# Patient Record
Sex: Male | Born: 1985
Health system: Southern US, Community
[De-identification: ages and names within clinical notes are randomized; demographics above are authoritative.]

## PROBLEM LIST (undated history)

## (undated) DIAGNOSIS — I509 Heart failure, unspecified: Secondary | ICD-10-CM

## (undated) DIAGNOSIS — R519 Headache, unspecified: Secondary | ICD-10-CM

## (undated) DIAGNOSIS — F32A Depression, unspecified: Secondary | ICD-10-CM

## (undated) DIAGNOSIS — K219 Gastro-esophageal reflux disease without esophagitis: Secondary | ICD-10-CM

## (undated) DIAGNOSIS — Z720 Tobacco use: Secondary | ICD-10-CM

## (undated) DIAGNOSIS — Z91199 Patient's noncompliance with other medical treatment and regimen due to unspecified reason: Secondary | ICD-10-CM

## (undated) DIAGNOSIS — F1210 Cannabis abuse, uncomplicated: Secondary | ICD-10-CM

## (undated) DIAGNOSIS — F319 Bipolar disorder, unspecified: Secondary | ICD-10-CM

## (undated) DIAGNOSIS — H9192 Unspecified hearing loss, left ear: Secondary | ICD-10-CM

## (undated) DIAGNOSIS — Q8781 Alport syndrome: Secondary | ICD-10-CM

## (undated) DIAGNOSIS — F329 Major depressive disorder, single episode, unspecified: Secondary | ICD-10-CM

## (undated) DIAGNOSIS — N289 Disorder of kidney and ureter, unspecified: Secondary | ICD-10-CM

## (undated) DIAGNOSIS — R011 Cardiac murmur, unspecified: Secondary | ICD-10-CM

## (undated) DIAGNOSIS — N186 End stage renal disease: Secondary | ICD-10-CM

## (undated) DIAGNOSIS — E162 Hypoglycemia, unspecified: Secondary | ICD-10-CM

## (undated) DIAGNOSIS — I10 Essential (primary) hypertension: Secondary | ICD-10-CM

## (undated) DIAGNOSIS — D649 Anemia, unspecified: Secondary | ICD-10-CM

## (undated) DIAGNOSIS — N189 Chronic kidney disease, unspecified: Secondary | ICD-10-CM

## (undated) DIAGNOSIS — Z992 Dependence on renal dialysis: Secondary | ICD-10-CM

## (undated) DIAGNOSIS — H9191 Unspecified hearing loss, right ear: Secondary | ICD-10-CM

## (undated) HISTORY — PX: APPENDECTOMY: SHX54

## (undated) HISTORY — PX: SPINE SURGERY: SHX786

## (undated) HISTORY — PX: OTHER SURGICAL HISTORY: SHX169

## (undated) HISTORY — DX: Chronic kidney disease, unspecified: N18.9

## (undated) HISTORY — DX: Hypoglycemia, unspecified: E16.2

## (undated) HISTORY — PX: WISDOM TOOTH EXTRACTION: SHX21

---
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
## 2009-06-06 ENCOUNTER — Emergency Department (HOSPITAL_COMMUNITY): Admission: EM | Admit: 2009-06-06 | Discharge: 2009-06-06 | Payer: Self-pay | Admitting: Family Medicine

## 2009-12-01 ENCOUNTER — Emergency Department (HOSPITAL_COMMUNITY): Admission: EM | Admit: 2009-12-01 | Discharge: 2009-12-01 | Payer: Self-pay | Admitting: Emergency Medicine

## 2010-03-09 ENCOUNTER — Other Ambulatory Visit: Payer: Self-pay | Admitting: Emergency Medicine

## 2010-03-09 ENCOUNTER — Inpatient Hospital Stay (HOSPITAL_COMMUNITY)
Admission: RE | Admit: 2010-03-09 | Discharge: 2010-03-12 | Payer: Self-pay | Source: Home / Self Care | Admitting: Psychiatry

## 2010-03-09 ENCOUNTER — Ambulatory Visit: Payer: Self-pay | Admitting: Psychiatry

## 2010-04-14 ENCOUNTER — Emergency Department (HOSPITAL_COMMUNITY)
Admission: EM | Admit: 2010-04-14 | Discharge: 2010-04-14 | Payer: Self-pay | Source: Home / Self Care | Admitting: Emergency Medicine

## 2010-04-15 ENCOUNTER — Emergency Department (HOSPITAL_COMMUNITY)
Admission: EM | Admit: 2010-04-15 | Discharge: 2010-04-15 | Payer: Self-pay | Source: Home / Self Care | Admitting: Emergency Medicine

## 2010-04-17 ENCOUNTER — Observation Stay (HOSPITAL_COMMUNITY)
Admission: EM | Admit: 2010-04-17 | Discharge: 2010-04-19 | Payer: Self-pay | Source: Home / Self Care | Attending: Internal Medicine | Admitting: Internal Medicine

## 2010-04-20 ENCOUNTER — Emergency Department (HOSPITAL_COMMUNITY)
Admission: EM | Admit: 2010-04-20 | Discharge: 2010-04-21 | Payer: Self-pay | Source: Home / Self Care | Admitting: Emergency Medicine

## 2010-04-23 ENCOUNTER — Emergency Department (HOSPITAL_COMMUNITY)
Admission: EM | Admit: 2010-04-23 | Discharge: 2010-04-24 | Payer: Self-pay | Source: Home / Self Care | Admitting: Emergency Medicine

## 2010-05-23 ENCOUNTER — Emergency Department (HOSPITAL_COMMUNITY)
Admission: EM | Admit: 2010-05-23 | Discharge: 2010-05-24 | Payer: Self-pay | Source: Home / Self Care | Admitting: Emergency Medicine

## 2010-05-27 LAB — DIFFERENTIAL
Basophils Absolute: 0 10*3/uL (ref 0.0–0.1)
Basophils Relative: 1 % (ref 0–1)
Eosinophils Absolute: 0.1 10*3/uL (ref 0.0–0.7)
Eosinophils Relative: 3 % (ref 0–5)
Lymphocytes Relative: 63 % — ABNORMAL HIGH (ref 12–46)
Lymphs Abs: 3.1 10*3/uL (ref 0.7–4.0)
Monocytes Absolute: 0.3 10*3/uL (ref 0.1–1.0)
Monocytes Relative: 7 % (ref 3–12)
Neutro Abs: 1.3 10*3/uL — ABNORMAL LOW (ref 1.7–7.7)
Neutrophils Relative %: 27 % — ABNORMAL LOW (ref 43–77)

## 2010-05-27 LAB — CBC
HCT: 38.8 % — ABNORMAL LOW (ref 39.0–52.0)
Hemoglobin: 13.3 g/dL (ref 13.0–17.0)
MCH: 31.1 pg (ref 26.0–34.0)
MCHC: 34.3 g/dL (ref 30.0–36.0)
MCV: 90.7 fL (ref 78.0–100.0)
Platelets: 161 K/uL (ref 150–400)
RBC: 4.28 MIL/uL (ref 4.22–5.81)
RDW: 13 % (ref 11.5–15.5)
WBC: 5 K/uL (ref 4.0–10.5)

## 2010-05-27 LAB — POCT I-STAT, CHEM 8
BUN: 33 mg/dL — ABNORMAL HIGH (ref 6–23)
Calcium, Ion: 1.09 mmol/L — ABNORMAL LOW (ref 1.12–1.32)
Chloride: 109 mEq/L (ref 96–112)
Creatinine, Ser: 2.7 mg/dL — ABNORMAL HIGH (ref 0.4–1.5)
Glucose, Bld: 88 mg/dL (ref 70–99)
HCT: 39 % (ref 39.0–52.0)
Hemoglobin: 13.3 g/dL (ref 13.0–17.0)
Potassium: 4.6 mEq/L (ref 3.5–5.1)
Sodium: 138 mEq/L (ref 135–145)
TCO2: 23 mmol/L (ref 0–100)

## 2010-05-27 LAB — URINALYSIS, ROUTINE W REFLEX MICROSCOPIC
Bilirubin Urine: NEGATIVE
Hgb urine dipstick: NEGATIVE
Ketones, ur: NEGATIVE mg/dL
Nitrite: NEGATIVE
Protein, ur: NEGATIVE mg/dL
Specific Gravity, Urine: 1.007 (ref 1.005–1.030)
Urine Glucose, Fasting: NEGATIVE mg/dL
Urobilinogen, UA: 0.2 mg/dL (ref 0.0–1.0)
pH: 6 (ref 5.0–8.0)

## 2010-05-27 LAB — POCT CARDIAC MARKERS
CKMB, poc: <1 ng/mL — ABNORMAL LOW (ref 1.0–8.0)
Myoglobin, poc: 65.6 ng/mL (ref 12–200)
Troponin i, poc: <0.05 ng/mL (ref 0.00–0.09)

## 2010-06-29 ENCOUNTER — Emergency Department (HOSPITAL_COMMUNITY): Payer: Medicare Other

## 2010-06-29 ENCOUNTER — Emergency Department (HOSPITAL_COMMUNITY)
Admission: EM | Admit: 2010-06-29 | Discharge: 2010-06-29 | Disposition: A | Discharge status: Home / Self Care | Payer: Medicare Other | Attending: Emergency Medicine | Admitting: Emergency Medicine

## 2010-06-29 DIAGNOSIS — R5381 Other malaise: Secondary | ICD-10-CM | POA: Insufficient documentation

## 2010-06-29 DIAGNOSIS — M545 Low back pain, unspecified: Secondary | ICD-10-CM | POA: Insufficient documentation

## 2010-06-29 DIAGNOSIS — R5383 Other fatigue: Secondary | ICD-10-CM | POA: Insufficient documentation

## 2010-06-29 DIAGNOSIS — R197 Diarrhea, unspecified: Secondary | ICD-10-CM | POA: Insufficient documentation

## 2010-06-29 DIAGNOSIS — Q898 Other specified congenital malformations: Secondary | ICD-10-CM | POA: Insufficient documentation

## 2010-06-29 DIAGNOSIS — R112 Nausea with vomiting, unspecified: Secondary | ICD-10-CM | POA: Insufficient documentation

## 2010-06-29 DIAGNOSIS — I129 Hypertensive chronic kidney disease with stage 1 through stage 4 chronic kidney disease, or unspecified chronic kidney disease: Secondary | ICD-10-CM | POA: Insufficient documentation

## 2010-06-29 DIAGNOSIS — F319 Bipolar disorder, unspecified: Secondary | ICD-10-CM | POA: Insufficient documentation

## 2010-06-29 DIAGNOSIS — N184 Chronic kidney disease, stage 4 (severe): Secondary | ICD-10-CM | POA: Insufficient documentation

## 2010-06-29 LAB — URINALYSIS, ROUTINE W REFLEX MICROSCOPIC
Bilirubin Urine: NEGATIVE
Hgb urine dipstick: NEGATIVE
Ketones, ur: NEGATIVE mg/dL
Leukocytes, UA: NEGATIVE
Nitrite: NEGATIVE
Protein, ur: 100 mg/dL — AB
Specific Gravity, Urine: 1.014 (ref 1.005–1.030)
Urine Glucose, Fasting: NEGATIVE mg/dL
Urobilinogen, UA: 0.2 mg/dL (ref 0.0–1.0)
pH: 5.5 (ref 5.0–8.0)

## 2010-06-29 LAB — DIFFERENTIAL
Basophils Absolute: 0 10*3/uL (ref 0.0–0.1)
Basophils Relative: 0 % (ref 0–1)
Eosinophils Absolute: 0 10*3/uL (ref 0.0–0.7)
Eosinophils Relative: 0 % (ref 0–5)
Lymphocytes Relative: 8 % — ABNORMAL LOW (ref 12–46)
Lymphs Abs: 0.5 10*3/uL — ABNORMAL LOW (ref 0.7–4.0)
Monocytes Absolute: 0.4 10*3/uL (ref 0.1–1.0)
Monocytes Relative: 5 % (ref 3–12)
Neutro Abs: 5.9 10*3/uL (ref 1.7–7.7)
Neutrophils Relative %: 87 % — ABNORMAL HIGH (ref 43–77)

## 2010-06-29 LAB — CBC
HCT: 46.9 % (ref 39.0–52.0)
Hemoglobin: 16.6 g/dL (ref 13.0–17.0)
MCH: 31.7 pg (ref 26.0–34.0)
MCHC: 35.4 g/dL (ref 30.0–36.0)
MCV: 89.7 fL (ref 78.0–100.0)
Platelets: 129 10*3/uL — ABNORMAL LOW (ref 150–400)
RBC: 5.23 MIL/uL (ref 4.22–5.81)
RDW: 12.6 % (ref 11.5–15.5)
WBC: 6.9 10*3/uL (ref 4.0–10.5)

## 2010-06-29 LAB — BASIC METABOLIC PANEL
BUN: 29 mg/dL — ABNORMAL HIGH (ref 6–23)
CO2: 23 mEq/L (ref 19–32)
Calcium: 9.5 mg/dL (ref 8.4–10.5)
Chloride: 109 mEq/L (ref 96–112)
Creatinine, Ser: 2.12 mg/dL — ABNORMAL HIGH (ref 0.4–1.5)
GFR calc Af Amer: 48 mL/min — ABNORMAL LOW (ref >60)
GFR calc non Af Amer: 39 mL/min — ABNORMAL LOW (ref >60)
Glucose, Bld: 102 mg/dL — ABNORMAL HIGH (ref 70–99)
Potassium: 4.9 mEq/L (ref 3.5–5.1)
Sodium: 138 mEq/L (ref 135–145)

## 2010-06-29 LAB — URINE MICROSCOPIC-ADD ON

## 2010-06-30 LAB — URINE CULTURE
Colony Count: NO GROWTH
Culture  Setup Time: 201202181733
Culture: NO GROWTH

## 2010-07-23 LAB — BASIC METABOLIC PANEL
BUN: 16 mg/dL (ref 6–23)
BUN: 21 mg/dL (ref 6–23)
BUN: 25 mg/dL — ABNORMAL HIGH (ref 6–23)
CO2: 25 mEq/L (ref 19–32)
CO2: 27 mEq/L (ref 19–32)
Calcium: 8.9 mg/dL (ref 8.4–10.5)
Calcium: 9.3 mg/dL (ref 8.4–10.5)
Chloride: 106 mEq/L (ref 96–112)
Creatinine, Ser: 2.06 mg/dL — ABNORMAL HIGH (ref 0.4–1.5)
GFR calc Af Amer: 41 mL/min — ABNORMAL LOW (ref >60)
GFR calc Af Amer: 46 mL/min — ABNORMAL LOW (ref >60)
GFR calc Af Amer: 49 mL/min — ABNORMAL LOW (ref >60)
GFR calc Af Amer: 48 mL/min — ABNORMAL LOW (ref >60)
GFR calc non Af Amer: 34 mL/min — ABNORMAL LOW (ref >60)
GFR calc non Af Amer: 40 mL/min — ABNORMAL LOW (ref >60)
Glucose, Bld: 93 mg/dL (ref 70–99)
Glucose, Bld: 81 mg/dL (ref 70–99)
Potassium: 4 mEq/L (ref 3.5–5.1)
Potassium: 4.3 mEq/L (ref 3.5–5.1)
Potassium: 4.2 mEq/L (ref 3.5–5.1)
Sodium: 141 mEq/L (ref 135–145)
Sodium: 142 mEq/L (ref 135–145)

## 2010-07-23 LAB — DIFFERENTIAL
Basophils Absolute: 0 10*3/uL (ref 0.0–0.1)
Basophils Relative: 3 % — ABNORMAL HIGH (ref 0–1)
Eosinophils Absolute: 0.3 10*3/uL (ref 0.0–0.7)
Eosinophils Relative: 2 % (ref 0–5)
Eosinophils Relative: 7 % — ABNORMAL HIGH (ref 0–5)
Lymphocytes Relative: 15 % (ref 12–46)
Lymphocytes Relative: 17 % (ref 12–46)
Lymphocytes Relative: 54 % — ABNORMAL HIGH (ref 12–46)
Lymphs Abs: 0.8 10*3/uL (ref 0.7–4.0)
Monocytes Absolute: 0.4 10*3/uL (ref 0.1–1.0)
Monocytes Absolute: 0.8 10*3/uL (ref 0.1–1.0)
Neutro Abs: 8 10*3/uL — ABNORMAL HIGH (ref 1.7–7.7)
Neutro Abs: 0.9 10*3/uL — ABNORMAL LOW (ref 1.7–7.7)
Neutrophils Relative %: 24 % — ABNORMAL LOW (ref 43–77)

## 2010-07-23 LAB — CSF CULTURE W GRAM STAIN: Gram Stain: NONE SEEN

## 2010-07-23 LAB — URINALYSIS, ROUTINE W REFLEX MICROSCOPIC
Bilirubin Urine: NEGATIVE
Bilirubin Urine: NEGATIVE
Hgb urine dipstick: NEGATIVE
Ketones, ur: NEGATIVE mg/dL
Leukocytes, UA: NEGATIVE
Nitrite: NEGATIVE
Nitrite: NEGATIVE
Protein, ur: 30 mg/dL — AB
Protein, ur: 100 mg/dL — AB
Specific Gravity, Urine: 1.007 (ref 1.005–1.030)
Specific Gravity, Urine: 1.01 (ref 1.005–1.030)
Urobilinogen, UA: 0.2 mg/dL (ref 0.0–1.0)
Urobilinogen, UA: 0.2 mg/dL (ref 0.0–1.0)
pH: 6 (ref 5.0–8.0)

## 2010-07-23 LAB — CSF CELL COUNT WITH DIFFERENTIAL
RBC Count, CSF: 9 /mm3 — ABNORMAL HIGH
Tube #: 4
WBC, CSF: 1 /mm3 (ref 0–5)

## 2010-07-23 LAB — URINE CULTURE: Culture: NO GROWTH

## 2010-07-23 LAB — CBC
HCT: 40.3 % (ref 39.0–52.0)
HCT: 42.8 % (ref 39.0–52.0)
HCT: 40.2 % (ref 39.0–52.0)
Hemoglobin: 13.9 g/dL (ref 13.0–17.0)
MCH: 31.5 pg (ref 26.0–34.0)
MCH: 31.1 pg (ref 26.0–34.0)
MCHC: 34.6 g/dL (ref 30.0–36.0)
MCHC: 34.6 g/dL (ref 30.0–36.0)
MCV: 90.5 fL (ref 78.0–100.0)
MCV: 91 fL (ref 78.0–100.0)
Platelets: 153 10*3/uL (ref 150–400)
RBC: 4.44 MIL/uL (ref 4.22–5.81)
RBC: 4.73 MIL/uL (ref 4.22–5.81)
RBC: 4.47 MIL/uL (ref 4.22–5.81)
RDW: 12.7 % (ref 11.5–15.5)
RDW: 13 % (ref 11.5–15.5)
WBC: 10.8 10*3/uL — ABNORMAL HIGH (ref 4.0–10.5)
WBC: 5.4 10*3/uL (ref 4.0–10.5)

## 2010-07-23 LAB — GRAM STAIN: Gram Stain: NONE SEEN

## 2010-07-23 LAB — URINE MICROSCOPIC-ADD ON

## 2010-07-23 LAB — AFB CULTURE WITH SMEAR (NOT AT ARMC): Acid Fast Smear: NONE SEEN

## 2010-07-23 LAB — HEPATIC FUNCTION PANEL
ALT: 17 U/L (ref 0–53)
Alkaline Phosphatase: 66 U/L (ref 39–117)
Bilirubin, Direct: <0.1 mg/dL (ref 0.0–0.3)

## 2010-07-23 LAB — GC/CHLAMYDIA PROBE AMP, URINE
Chlamydia, Swab/Urine, PCR: POSITIVE — AB
GC Probe Amp, Urine: NEGATIVE

## 2010-07-23 LAB — PROTEIN AND GLUCOSE, CSF: Glucose, CSF: 57 mg/dL (ref 43–76)

## 2010-07-24 LAB — COMPREHENSIVE METABOLIC PANEL
ALT: 20 U/L (ref 0–53)
AST: 26 U/L (ref 0–37)
Albumin: 4.1 g/dL (ref 3.5–5.2)
Calcium: 9.7 mg/dL (ref 8.4–10.5)
Creatinine, Ser: 2.18 mg/dL — ABNORMAL HIGH (ref 0.4–1.5)
GFR calc Af Amer: 46 mL/min — ABNORMAL LOW (ref >60)
GFR calc non Af Amer: 38 mL/min — ABNORMAL LOW (ref >60)
Sodium: 140 mEq/L (ref 135–145)
Total Protein: 7.7 g/dL (ref 6.0–8.3)

## 2010-07-24 LAB — CBC
MCH: 32.2 pg (ref 26.0–34.0)
MCHC: 34.5 g/dL (ref 30.0–36.0)
Platelets: 161 10*3/uL (ref 150–400)

## 2010-07-24 LAB — DIFFERENTIAL
Eosinophils Relative: 1 % (ref 0–5)
Lymphocytes Relative: 14 % (ref 12–46)
Lymphs Abs: 0.8 10*3/uL (ref 0.7–4.0)
Monocytes Absolute: 0.3 10*3/uL (ref 0.1–1.0)
Monocytes Relative: 6 % (ref 3–12)

## 2010-07-24 LAB — URINE CULTURE
Colony Count: NO GROWTH
Culture: NO GROWTH
Special Requests: NEGATIVE

## 2010-07-24 LAB — RAPID URINE DRUG SCREEN, HOSP PERFORMED
Amphetamines: NOT DETECTED
Barbiturates: NOT DETECTED
Benzodiazepines: NOT DETECTED
Tetrahydrocannabinol: POSITIVE — AB

## 2010-07-24 LAB — BASIC METABOLIC PANEL
Calcium: 8.9 mg/dL (ref 8.4–10.5)
GFR calc non Af Amer: 31 mL/min — ABNORMAL LOW (ref >60)
Glucose, Bld: 111 mg/dL — ABNORMAL HIGH (ref 70–99)
Sodium: 140 mEq/L (ref 135–145)

## 2010-07-24 LAB — URINALYSIS, ROUTINE W REFLEX MICROSCOPIC
Bilirubin Urine: NEGATIVE
Ketones, ur: NEGATIVE mg/dL
Nitrite: NEGATIVE
Specific Gravity, Urine: 1.012 (ref 1.005–1.030)
Urobilinogen, UA: 0.2 mg/dL (ref 0.0–1.0)

## 2010-07-24 LAB — URINE MICROSCOPIC-ADD ON

## 2010-07-24 LAB — ETHANOL: Alcohol, Ethyl (B): <5 mg/dL (ref 0–10)

## 2010-08-19 ENCOUNTER — Emergency Department (HOSPITAL_COMMUNITY)
Admission: EM | Admit: 2010-08-19 | Discharge: 2010-08-20 | Disposition: A | Discharge status: Other Acute Inpatient Hospital | Payer: Medicare Other | Source: Home / Self Care | Attending: Emergency Medicine | Admitting: Emergency Medicine

## 2010-08-19 ENCOUNTER — Ambulatory Visit (HOSPITAL_COMMUNITY)
Admission: RE | Admit: 2010-08-19 | Discharge: 2010-08-19 | Disposition: A | Discharge status: Home / Self Care | Payer: Medicare Other | Attending: Psychiatry | Admitting: Psychiatry

## 2010-08-19 DIAGNOSIS — I10 Essential (primary) hypertension: Secondary | ICD-10-CM | POA: Insufficient documentation

## 2010-08-19 DIAGNOSIS — F319 Bipolar disorder, unspecified: Secondary | ICD-10-CM | POA: Insufficient documentation

## 2010-08-19 DIAGNOSIS — R45851 Suicidal ideations: Secondary | ICD-10-CM | POA: Insufficient documentation

## 2010-08-19 DIAGNOSIS — F329 Major depressive disorder, single episode, unspecified: Secondary | ICD-10-CM | POA: Insufficient documentation

## 2010-08-19 DIAGNOSIS — F3289 Other specified depressive episodes: Secondary | ICD-10-CM | POA: Insufficient documentation

## 2010-08-19 LAB — ETHANOL: Alcohol, Ethyl (B): <5 mg/dL (ref 0–10)

## 2010-08-19 LAB — RAPID URINE DRUG SCREEN, HOSP PERFORMED
Benzodiazepines: NOT DETECTED
Cocaine: NOT DETECTED

## 2010-08-19 LAB — CBC
MCHC: 34.2 g/dL (ref 30.0–36.0)
RDW: 12.5 % (ref 11.5–15.5)

## 2010-08-19 LAB — COMPREHENSIVE METABOLIC PANEL
ALT: 22 U/L (ref 0–53)
AST: 31 U/L (ref 0–37)
Calcium: 9.5 mg/dL (ref 8.4–10.5)
GFR calc Af Amer: 43 mL/min — ABNORMAL LOW (ref >60)
Sodium: 138 mEq/L (ref 135–145)
Total Protein: 7.5 g/dL (ref 6.0–8.3)

## 2010-08-19 LAB — DIFFERENTIAL
Basophils Absolute: 0 10*3/uL (ref 0.0–0.1)
Basophils Relative: 1 % (ref 0–1)
Eosinophils Relative: 2 % (ref 0–5)
Monocytes Absolute: 0.3 10*3/uL (ref 0.1–1.0)

## 2010-08-20 ENCOUNTER — Inpatient Hospital Stay (HOSPITAL_COMMUNITY)
Admission: RE | Admit: 2010-08-20 | Discharge: 2010-08-23 | DRG: 885 | Disposition: A | Discharge status: Home / Self Care | Payer: Medicare Other | Source: Ambulatory Visit | Attending: Psychiatry | Admitting: Psychiatry

## 2010-08-20 DIAGNOSIS — F39 Unspecified mood [affective] disorder: Principal | ICD-10-CM

## 2010-08-20 DIAGNOSIS — M549 Dorsalgia, unspecified: Secondary | ICD-10-CM

## 2010-08-20 DIAGNOSIS — M79609 Pain in unspecified limb: Secondary | ICD-10-CM

## 2010-08-20 DIAGNOSIS — G8929 Other chronic pain: Secondary | ICD-10-CM

## 2010-08-20 DIAGNOSIS — H9190 Unspecified hearing loss, unspecified ear: Secondary | ICD-10-CM

## 2010-08-20 DIAGNOSIS — Z9119 Patient's noncompliance with other medical treatment and regimen: Secondary | ICD-10-CM

## 2010-08-20 DIAGNOSIS — Q898 Other specified congenital malformations: Secondary | ICD-10-CM

## 2010-08-20 DIAGNOSIS — Z91199 Patient's noncompliance with other medical treatment and regimen due to unspecified reason: Secondary | ICD-10-CM

## 2010-08-20 DIAGNOSIS — I129 Hypertensive chronic kidney disease with stage 1 through stage 4 chronic kidney disease, or unspecified chronic kidney disease: Secondary | ICD-10-CM

## 2010-08-20 DIAGNOSIS — N184 Chronic kidney disease, stage 4 (severe): Secondary | ICD-10-CM

## 2010-08-21 DIAGNOSIS — F329 Major depressive disorder, single episode, unspecified: Secondary | ICD-10-CM

## 2010-08-21 LAB — GC/CHLAMYDIA PROBE AMP, URINE: GC Probe Amp, Urine: NEGATIVE

## 2010-08-24 NOTE — Discharge Summary (Signed)
NAMEELLIJAH, CARGILE               ACCOUNT NO.:  [id number]  MEDICAL RECORD NO.:  [id number]           PATIENT TYPE:  I  LOCATION:  0307                          FACILITY:  [facility]  PHYSICIAN:  Norm Salt, MD  DATE OF BIRTH:  11-Sep-1985  DATE OF ADMISSION:  08/20/2010 DATE OF DISCHARGE:  08/23/2010                              DISCHARGE SUMMARY   IDENTIFYING DATA AND REASON FOR ADMISSION:  This was an inpatient psychiatric admission for Drew, a 25 year old African American male who was admitted due to deterioration of mood and behavior.  Please refer to the admission note for further details pertaining to the symptoms, circumstances and history that led to his hospitalization.  He was given an initial Axis I diagnosis of mood disorder NOS.  MEDICAL AND LABORATORY:  The patient was medically and physically assessed by the psychiatric nurse practitioner.  Aside from hearing impairment, he was in good health without any active or chronic medical problems.  There were no significant medical issues.  HOSPITAL COURSE:  The patient was admitted to the adult inpatient psychiatric service.  He presented as a well-nourished, normally- developed young man who was generally cooperative but at times sullen and irritable.  He had been off his Risperdal regimen for some time, and this appeared to have a lot to do with the deterioration of his mood stability.  He was agreeable, however, to getting started back on medication and emphasized that he did not like the degree of irritability and unpredictability regarding his moods that he had been experiencing.  He was treated with a regimen of low-dose Risperdal and Depakote.  He responded very well to these.  At times during his stay, he required one-to-one staffing for support, but we were able to discontinue this approximately 24 hours prior to his discharge, and he did very well unsupervised for that final period of his stay.  He  worked closely with case management towards developing a solid aftercare plan.  He participated in therapeutic groups and activities geared towards helping him acquire better coping skills, a better understanding of his underlying disorders and dynamics, and the development of an aftercare plan.  By the final hospital day, his mood was markedly improved.  He was relaxed, pleasant, smiling, and felt optimistic about how he would do in the future.  He appeared appropriate for discharge.  The suicide risk assessment was completed, and he was felt to be at minimal risk.  He agreed to the following aftercare plan.  AFTERCARE:  The patient was to follow up at [facility] with an appointment on the day of discharge, April 13, at 1:00 p.m.  DISCHARGE MEDICATIONS: 1. Risperdal 1 mg 0.5 mg b.i.d. 2. Depakote 500 mg b.i.d.  DISCHARGE DIAGNOSES:  AXIS I:  Mood disorder NOS. AXIS II:  Deferred. AXIS III:  Hearing impaired. AXIS IV:  Stressors severe. AXIS V:  GAF on discharge 50.     Norm Salt, MD     SPB/MEDQ  D:  08/23/2010  T:  08/23/2010  Job:  [id number]  Electronically Signed by Waymon Amato MD on 08/24/2010 02:57:20 PM
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
## 2011-06-20 ENCOUNTER — Encounter (HOSPITAL_COMMUNITY): Payer: Self-pay | Admitting: *Deleted

## 2011-06-20 ENCOUNTER — Emergency Department (HOSPITAL_COMMUNITY)
Admission: EM | Admit: 2011-06-20 | Discharge: 2011-06-20 | Disposition: A | Discharge status: Home / Self Care | Payer: Medicare Other | Attending: Emergency Medicine | Admitting: Emergency Medicine

## 2011-06-20 DIAGNOSIS — K625 Hemorrhage of anus and rectum: Secondary | ICD-10-CM | POA: Insufficient documentation

## 2011-06-20 DIAGNOSIS — R1110 Vomiting, unspecified: Secondary | ICD-10-CM

## 2011-06-20 DIAGNOSIS — K5900 Constipation, unspecified: Secondary | ICD-10-CM | POA: Insufficient documentation

## 2011-06-20 DIAGNOSIS — K921 Melena: Secondary | ICD-10-CM | POA: Insufficient documentation

## 2011-06-20 DIAGNOSIS — R112 Nausea with vomiting, unspecified: Secondary | ICD-10-CM | POA: Insufficient documentation

## 2011-06-20 DIAGNOSIS — R197 Diarrhea, unspecified: Secondary | ICD-10-CM | POA: Insufficient documentation

## 2011-06-20 DIAGNOSIS — K6289 Other specified diseases of anus and rectum: Secondary | ICD-10-CM | POA: Insufficient documentation

## 2011-06-20 DIAGNOSIS — Z79899 Other long term (current) drug therapy: Secondary | ICD-10-CM | POA: Insufficient documentation

## 2011-06-20 DIAGNOSIS — R1013 Epigastric pain: Secondary | ICD-10-CM | POA: Insufficient documentation

## 2011-06-20 DIAGNOSIS — I10 Essential (primary) hypertension: Secondary | ICD-10-CM | POA: Insufficient documentation

## 2011-06-20 HISTORY — DX: Essential (primary) hypertension: I10

## 2011-06-20 HISTORY — DX: Depression, unspecified: F32.A

## 2011-06-20 HISTORY — DX: Bipolar disorder, unspecified: F31.9

## 2011-06-20 HISTORY — DX: Disorder of kidney and ureter, unspecified: N28.9

## 2011-06-20 HISTORY — DX: Major depressive disorder, single episode, unspecified: F32.9

## 2011-06-20 LAB — URINE MICROSCOPIC-ADD ON

## 2011-06-20 LAB — DIFFERENTIAL
Basophils Relative: 0 % (ref 0–1)
Eosinophils Absolute: 0 10*3/uL (ref 0.0–0.7)
Eosinophils Relative: 0 % (ref 0–5)
Monocytes Absolute: 0.3 10*3/uL (ref 0.1–1.0)
Monocytes Relative: 5 % (ref 3–12)
Neutrophils Relative %: 88 % — ABNORMAL HIGH (ref 43–77)

## 2011-06-20 LAB — BASIC METABOLIC PANEL
BUN: 24 mg/dL — ABNORMAL HIGH (ref 6–23)
Calcium: 9.2 mg/dL (ref 8.4–10.5)
Creatinine, Ser: 2.04 mg/dL — ABNORMAL HIGH (ref 0.50–1.35)
GFR calc Af Amer: 51 mL/min — ABNORMAL LOW (ref >90)
GFR calc non Af Amer: 44 mL/min — ABNORMAL LOW (ref >90)
Potassium: 4 mEq/L (ref 3.5–5.1)

## 2011-06-20 LAB — URINALYSIS, ROUTINE W REFLEX MICROSCOPIC
Bilirubin Urine: NEGATIVE
Ketones, ur: NEGATIVE mg/dL
Nitrite: NEGATIVE
Protein, ur: 100 mg/dL — AB
Urobilinogen, UA: 0.2 mg/dL (ref 0.0–1.0)

## 2011-06-20 LAB — CBC
Hemoglobin: 15.3 g/dL (ref 13.0–17.0)
MCH: 31.2 pg (ref 26.0–34.0)
MCHC: 35.2 g/dL (ref 30.0–36.0)

## 2011-06-20 MED ORDER — ONDANSETRON HCL 4 MG/2ML IJ SOLN
INTRAMUSCULAR | Status: AC
  Administered 2011-06-20: 4 mg via INTRAVENOUS
  Filled 2011-06-20: qty 2

## 2011-06-20 MED ORDER — ONDANSETRON HCL 4 MG/2ML IJ SOLN
4.0000 mg | Freq: Once | INTRAMUSCULAR | Status: AC
  Administered 2011-06-20: 4 mg via INTRAVENOUS

## 2011-06-20 MED ORDER — ACETAMINOPHEN 325 MG PO TABS
ORAL_TABLET | ORAL | Status: AC
  Filled 2011-06-20: qty 2

## 2011-06-20 MED ORDER — ONDANSETRON HCL 4 MG/2ML IJ SOLN
4.0000 mg | Freq: Once | INTRAMUSCULAR | Status: AC
  Administered 2011-06-20: 4 mg via INTRAVENOUS
  Filled 2011-06-20: qty 2

## 2011-06-20 MED ORDER — PANTOPRAZOLE SODIUM 40 MG IV SOLR
40.0000 mg | Freq: Once | INTRAVENOUS | Status: AC
  Administered 2011-06-20: 40 mg via INTRAVENOUS
  Filled 2011-06-20: qty 40

## 2011-06-20 MED ORDER — ACETAMINOPHEN 325 MG PO TABS
650.0000 mg | ORAL_TABLET | Freq: Once | ORAL | Status: AC
  Administered 2011-06-20: 650 mg via ORAL

## 2011-06-20 MED ORDER — ONDANSETRON HCL 4 MG PO TABS: 4.0000 mg | ORAL_TABLET | Freq: Four times a day (QID) | ORAL | Status: AC

## 2011-06-20 NOTE — ED Notes (Signed)
Nausea  2 days; vomiting and diarrhea since last night; tympanic temp 100.4 F. VSS otherwise.  Some blood with diarrhea; lower back pain this morning. Hr 102.

## 2011-06-20 NOTE — ED Provider Notes (Signed)
History     CSN: [id number]  Arrival date & time 06/20/11  [id number]   First MD Initiated Contact with Patient 06/20/11 1040      Chief Complaint  Patient presents with  . Nausea    (Consider location/radiation/quality/duration/timing/severity/associated sxs/prior treatment) Patient is a 26 y.o. male presenting with vomiting. The history is provided by the patient.  Emesis  This is a new problem. The current episode started yesterday. The problem occurs 2 to 4 times per day. The problem has not changed since onset.The emesis has an appearance of stomach contents. There has been no fever. Associated symptoms include diarrhea. Pertinent negatives include no chills, no fever and no myalgias. Associated symptoms comments: The patient also complains of history of constipation with rectal bleeding that has been going on for months. Rectal bleeding persisted with multiple bowel movements yesterday. No difference in color of stool. .    Past Medical History  Diagnosis Date  . Hypertension   . Renal disorder     No past surgical history on file.  No family history on file.  History  Substance Use Topics  . Smoking status: Not on file  . Smokeless tobacco: Not on file  . Alcohol Use: No      Review of Systems  Constitutional: Negative for fever and chills.  HENT: Negative.   Respiratory: Negative.   Cardiovascular: Negative.   Gastrointestinal: Positive for vomiting, diarrhea, blood in stool and rectal pain.       He reports epigastric burning like previous history of indigestion. He has not taken anything to relieve symptoms.  Musculoskeletal: Negative.  Negative for myalgias.  Skin: Negative.   Neurological: Negative.     Allergies  Review of patient's allergies indicates not on file.  Home Medications   Current Outpatient Rx  Name Route Sig Dispense Refill  . LISINOPRIL 5 MG PO TABS Oral Take 5 mg by mouth daily.    Marland Kitchen PALIPERIDONE PALMITATE 117 MG/0.75ML IM SUSP  Intramuscular Inject 0.75 mLs into the muscle every 30 (thirty) days.    . TRAZODONE HCL 50 MG PO TABS Oral Take 50 mg by mouth at bedtime.      BP 129/71  Pulse 110  Temp(Src) 99.8 F (37.7 C) (Oral)  SpO2 94%  Physical Exam  Constitutional: He appears well-developed and well-nourished.  HENT:  Head: Normocephalic.  Neck: Normal range of motion. Neck supple.  Cardiovascular: Normal rate and regular rhythm.   Pulmonary/Chest: Effort normal and breath sounds normal.  Abdominal: Soft. Bowel sounds are normal. There is no tenderness. There is no rebound and no guarding.  Musculoskeletal: Normal range of motion.  Neurological: [name]. No cranial nerve deficit.  Skin: Skin is warm and dry. No rash noted.  Psychiatric: He has a normal mood and affect.    ED Course  Procedures (including critical care time) No vomiting in ED. Abdominal burning improved. Tolerating PO fluids. Can discharge home. Labs Reviewed  CBC - Abnormal; Notable for the following:    Platelets 131 (*)    All other components within normal limits  DIFFERENTIAL - Abnormal; Notable for the following:    Neutrophils Relative 88 (*)    Lymphocytes Relative 7 (*)    Lymphs Abs 0.4 (*)    All other components within normal limits  BASIC METABOLIC PANEL - Abnormal; Notable for the following:    Glucose, Bld 101 (*)    BUN 24 (*)    Creatinine, Ser 2.04 (*)  GFR calc non Af Amer 44 (*)    GFR calc Af Amer 51 (*)    All other components within normal limits  URINALYSIS, ROUTINE W REFLEX MICROSCOPIC   No results found.   No diagnosis found.    MDM          Leotis Shames, PA-C 06/20/11 1359

## 2011-06-21 NOTE — ED Provider Notes (Signed)
Medical screening examination/treatment/procedure(s) were performed by non-physician practitioner and as supervising physician I was immediately available for consultation/collaboration.  Richarda Blade, MD 06/21/11 [phone]

## 2012-05-14 ENCOUNTER — Encounter (HOSPITAL_COMMUNITY): Payer: Self-pay | Admitting: Emergency Medicine

## 2012-05-14 ENCOUNTER — Emergency Department (HOSPITAL_COMMUNITY): Payer: Medicare Other

## 2012-05-14 ENCOUNTER — Emergency Department (HOSPITAL_COMMUNITY)
Admission: EM | Admit: 2012-05-14 | Discharge: 2012-05-14 | Disposition: A | Discharge status: Home / Self Care | Payer: Medicare Other | Attending: Emergency Medicine | Admitting: Emergency Medicine

## 2012-05-14 DIAGNOSIS — F319 Bipolar disorder, unspecified: Secondary | ICD-10-CM | POA: Insufficient documentation

## 2012-05-14 DIAGNOSIS — Z87448 Personal history of other diseases of urinary system: Secondary | ICD-10-CM | POA: Insufficient documentation

## 2012-05-14 DIAGNOSIS — Z79899 Other long term (current) drug therapy: Secondary | ICD-10-CM | POA: Insufficient documentation

## 2012-05-14 DIAGNOSIS — M7989 Other specified soft tissue disorders: Secondary | ICD-10-CM | POA: Insufficient documentation

## 2012-05-14 DIAGNOSIS — W2209XA Striking against other stationary object, initial encounter: Secondary | ICD-10-CM | POA: Insufficient documentation

## 2012-05-14 DIAGNOSIS — I10 Essential (primary) hypertension: Secondary | ICD-10-CM | POA: Insufficient documentation

## 2012-05-14 DIAGNOSIS — Y929 Unspecified place or not applicable: Secondary | ICD-10-CM | POA: Insufficient documentation

## 2012-05-14 DIAGNOSIS — S63509A Unspecified sprain of unspecified wrist, initial encounter: Secondary | ICD-10-CM

## 2012-05-14 DIAGNOSIS — R209 Unspecified disturbances of skin sensation: Secondary | ICD-10-CM | POA: Insufficient documentation

## 2012-05-14 DIAGNOSIS — Y939 Activity, unspecified: Secondary | ICD-10-CM | POA: Insufficient documentation

## 2012-05-14 MED ORDER — OXYCODONE-ACETAMINOPHEN 5-325 MG PO TABS: 1.0000 | ORAL_TABLET | Freq: Four times a day (QID) | ORAL | Status: DC | PRN

## 2012-05-14 NOTE — ED Provider Notes (Signed)
History     CSN: [id number]  Arrival date & time 05/14/12  0005   First MD Initiated Contact with Patient 05/14/12 0013      Chief Complaint  Patient presents with  . Hand Injury    (Consider location/radiation/quality/duration/timing/severity/associated sxs/prior treatment) Patient is a 27 y.o. male presenting with hand injury. The history is provided by the patient.  Hand Injury    patient reportedly punched a wall today. Right hand pain. Patient states that he thinks he broke it. No other injuries. States it hurts to move and feels  Past Medical History  Diagnosis Date  . Hypertension   . Renal disorder   . Bipolar 1 disorder   . Depression     History reviewed. No pertinent past surgical history.  History reviewed. No pertinent family history.  History  Substance Use Topics  . Smoking status: Not on file  . Smokeless tobacco: Not on file  . Alcohol Use: No      Review of Systems  Genitourinary: Negative for flank pain.  Musculoskeletal: Negative for back pain.       Pain and swelling in right hand and wrist.  Skin: Negative for wound.  Neurological: Positive for numbness.    Allergies  Review of patient's allergies indicates no known allergies.  Home Medications   Current Outpatient Rx  Name  Route  Sig  Dispense  Refill  . LISINOPRIL 5 MG PO TABS   Oral   Take 5 mg by mouth daily.         . OXYCODONE-ACETAMINOPHEN 5-325 MG PO TABS   Oral   Take 1-2 tablets by mouth every 6 (six) hours as needed for pain.   10 tablet   0     BP 139/87  Pulse 92  Temp 98.8 F (37.1 C) (Oral)  Resp 18  SpO2 95%  Physical Exam  Constitutional: He appears well-developed and well-nourished.  Musculoskeletal:       Swelling in right hand. Skin intact. Tenderness over right fourth and fifth metacarpal. Somewhat decreased grip strength and entire hand. Sensation grossly intact. Good capillary refill.  Neurological: [name].  Skin: Skin is warm and dry.      ED Course  Procedures (including critical care time)  Labs Reviewed - No data to display Dg Hand Complete Right  05/14/2012  *RADIOLOGY REPORT*  Clinical Data: Struck wall with right hand; pain and swelling about the fifth metacarpal.  RIGHT HAND - COMPLETE 3+ VIEW  Comparison: None.  Findings: There is no evidence of fracture or dislocation.  The joint spaces are preserved; the soft tissues are unremarkable in appearance.  The carpal rows are intact, and demonstrate normal alignment.  The fifth metacarpal appears intact.  Mild negative ulnar variance is noted.  IMPRESSION: No evidence of fracture or dislocation.   Original Report Authenticated By: Santa Lighter, M.D.      1. Wrist sprain       MDM  Patient punched a wall. No pain in his right hand and wrist. Worst tenderness is over ulnar area of the wrist. No snuffbox tenderness. Negative x-ray. Will give broke her wrist splint and will have followup with hand surgery as needed.        Jasper Riling. Alvino Chapel, MD 05/14/12 [id number]

## 2012-05-14 NOTE — ED Notes (Signed)
Patient complaining of right hand pain and injury; reports that he punched a wall today.

## 2012-05-14 NOTE — Discharge Instructions (Signed)
Wrist Sprain with Rehab A sprain is an injury in which a ligament that maintains the proper alignment of a joint is partially or completely torn. The ligaments of the wrist are susceptible to sprains. Sprains are classified into three categories. Grade 1 sprains cause pain, but the tendon is not lengthened. Grade 2 sprains include a lengthened ligament because the ligament is stretched or partially ruptured. With grade 2 sprains there is still function, although the function may be diminished. Grade 3 sprains are characterized by a complete tear of the tendon or muscle, and function is usually impaired. SYMPTOMS   Pain tenderness, inflammation, and/or bruising (contusion) of the injury.  A "pop" or tear felt and/or heard at the time of injury.  Decreased wrist function. CAUSES  A wrist sprain occurs when a force is placed on one or more ligaments that is greater than it/they can withstand. Common mechanisms of injury include:  Catching a ball with you hands.  Repetitive and/ or strenuous extension or flexion of the wrist. RISK INCREASES WITH:  Previous wrist injury.  Contact sports (boxing or wrestling).  Activities in which falling is common.  Poor strength and flexibility.  Improperly fitted or padded protective equipment. PREVENTION  Warm up and stretch properly before activity.  Allow for adequate recovery between workouts.  Maintain physical fitness:  Strength, flexibility, and endurance.  Cardiovascular fitness.  Protect the wrist joint by limiting its motion with the use of taping, braces, or splints.  Protect the wrist after injury for 6 to 12 months. PROGNOSIS  The prognosis for wrist sprains depends on the degree of injury. Grade 1 sprains require 2 to 6 weeks of treatment. Grade 2 sprains require 6 to 8 weeks of treatment, and grade 3 sprains require up to 12 weeks.  RELATED COMPLICATIONS   Prolonged healing time, if improperly treated or  re-injured.  Recurrent symptoms that result in a chronic problem.  Injury to nearby structures (bone, cartilage, nerves, or tendons).  Arthritis of the wrist.  Inability to compete in athletics at a high level.  Wrist stiffness or weakness.  Progression to a complete rupture of the ligament. TREATMENT  Treatment initially involves resting from any activities that aggravate the symptoms, and the use of ice and medications to help reduce pain and inflammation. Your caregiver may recommend immobilizing the wrist for a period of time in order to reduce stress on the ligament and allow for healing. After immobilization it is important to perform strengthening and stretching exercises to help regain strength and a full range of motion. These exercises may be completed at home or with a therapist. Surgery is not usually required for wrist sprains, unless the ligament has been ruptured (grade 3 sprain). MEDICATION   If pain medication is necessary, then nonsteroidal anti-inflammatory medications, such as aspirin and ibuprofen, or other minor pain relievers, such as acetaminophen, are often recommended.  Do not take pain medication for 7 days before surgery.  Prescription pain relievers may be given if deemed necessary by your caregiver. Use only as directed and only as much as you need. HEAT AND COLD  Cold treatment (icing) relieves pain and reduces inflammation. Cold treatment should be applied for 10 to 15 minutes every 2 to 3 hours for inflammation and pain and immediately after any activity that aggravates your symptoms. Use ice packs or massage the area with a piece of ice (ice massage).  Heat treatment may be used prior to performing the stretching and strengthening activities prescribed by your  caregiver, physical therapist, or athletic trainer. Use a heat pack or soak your injury in warm water. SEEK MEDICAL CARE IF:  Treatment seems to offer no benefit, or the condition worsens.  Any  medications produce adverse side effects. EXERCISES RANGE OF MOTION (ROM) AND STRETCHING EXERCISES - Wrist Sprain  These exercises may help you when beginning to rehabilitate your injury. Your symptoms may resolve with or without further involvement from your physician, physical therapist or athletic trainer. While completing these exercises, remember:   Restoring tissue flexibility helps normal motion to return to the joints. This allows healthier, less painful movement and activity.  An effective stretch should be held for at least 30 seconds.  A stretch should never be painful. You should only feel a gentle lengthening or release in the stretched tissue. RANGE OF MOTION  Wrist Flexion, Active-Assisted  Extend your right / left elbow with your fingers pointing down.*  Gently pull the back of your hand towards you until you feel a gentle stretch on the top of your forearm.  Hold this position for __________ seconds. Repeat __________ times. Complete this exercise __________ times per day.  *If directed by your physician, physical therapist or athletic trainer, complete this stretch with your elbow bent rather than extended. RANGE OF MOTION  Wrist Extension, Active-Assisted  Extend your right / left elbow and turn your palm upwards.*  Gently pull your palm/fingertips back so your wrist extends and your fingers point more toward the ground.  You should feel a gentle stretch on the inside of your forearm.  Hold this position for __________ seconds. Repeat __________ times. Complete this exercise __________ times per day. *If directed by your physician, physical therapist or athletic trainer, complete this stretch with your elbow bent, rather than extended. RANGE OF MOTION  Supination, Active  Stand or sit with your elbows at your side. Bend your right / left elbow to 90 degrees.  Turn your palm upward until you feel a gentle stretch on the inside of your forearm.  Hold this position  for __________ seconds. Slowly release and return to the starting position. Repeat __________ times. Complete this stretch __________ times per day.  RANGE OF MOTION  Pronation, Active  Stand or sit with your elbows at your side. Bend your right / left elbow to 90 degrees.  Turn your palm downward until you feel a gentle stretch on the top of your forearm.  Hold this position for __________ seconds. Slowly release and return to the starting position. Repeat __________ times. Complete this stretch __________ times per day.  STRETCH - Wrist Flexion  Place the back of your right / left hand on a tabletop leaving your elbow slightly bent. Your fingers should point away from your body.  Gently press the back of your hand down onto the table by straightening your elbow. You should feel a stretch on the top of your forearm.  Hold this position for __________ seconds. Repeat __________ times. Complete this stretch __________ times per day.  STRETCH  Wrist Extension  Place your right / left fingertips on a tabletop leaving your elbow slightly bent. Your fingers should point backwards.  Gently press your fingers and palm down onto the table by straightening your elbow. You should feel a stretch on the inside of your forearm.  Hold this position for __________ seconds. Repeat __________ times. Complete this stretch __________ times per day.  STRENGTHENING EXERCISES - Wrist Sprain These exercises may help you when beginning to rehabilitate your injury.  They may resolve your symptoms with or without further involvement from your physician, physical therapist or athletic trainer. While completing these exercises, remember:   Muscles can gain both the endurance and the strength needed for everyday activities through controlled exercises.  Complete these exercises as instructed by your physician, physical therapist or athletic trainer. Progress with the resistance and repetition exercises only as your  caregiver advises. STRENGTH Wrist Flexors  Sit with your right / left forearm palm-up and fully supported. Your elbow should be resting below the height of your shoulder. Allow your wrist to extend over the edge of the surface.  Loosely holding a __________ weight or a piece of rubber exercise band/tubing, slowly curl your hand up toward your forearm.  Hold this position for __________ seconds. Slowly lower the wrist back to the starting position in a controlled manner. Repeat __________ times. Complete this exercise __________ times per day.  STRENGTH  Wrist Extensors  Sit with your right / left forearm palm-down and fully supported. Your elbow should be resting below the height of your shoulder. Allow your wrist to extend over the edge of the surface.  Loosely holding a __________ weight or a piece of rubber exercise band/tubing, slowly curl your hand up toward your forearm.  Hold this position for __________ seconds. Slowly lower the wrist back to the starting position in a controlled manner. Repeat __________ times. Complete this exercise __________ times per day.  STRENGTH - Ulnar Deviators  Stand with a ____________________ weight in your right / left hand, or sit holding on to the rubber exercise band/tubing with your opposite arm supported.  Move your wrist so that your pinkie travels toward your forearm and your thumb moves away from your forearm.  Hold this position for __________ seconds and then slowly lower the wrist back to the starting position. Repeat __________ times. Complete this exercise __________ times per day STRENGTH - Radial Deviators  Stand with a ____________________ weight in your  right / left hand, or sit holding on to the rubber exercise band/tubing with your arm supported.  Raise your hand upward in front of you or pull up on the rubber tubing.  Hold this position for __________ seconds and then slowly lower the wrist back to the starting  position. Repeat __________ times. Complete this exercise __________ times per day. STRENGTH  Forearm Supinators  Sit with your right / left forearm supported on a table, keeping your elbow below shoulder height. Rest your hand over the edge, palm down.  Gently grip a hammer or a soup ladle.  Without moving your elbow, slowly turn your palm and hand upward to a "thumbs-up" position.  Hold this position for __________ seconds. Slowly return to the starting position. Repeat __________ times. Complete this exercise __________ times per day.  STRENGTH  Forearm Pronators  Sit with your right / left forearm supported on a table, keeping your elbow below shoulder height. Rest your hand over the edge, palm up.  Gently grip a hammer or a soup ladle.  Without moving your elbow, slowly turn your palm and hand upward to a "thumbs-up" position.  Hold this position for __________ seconds. Slowly return to the starting position. Repeat __________ times. Complete this exercise __________ times per day.  STRENGTH - Grip  Grasp a tennis ball, a dense sponge, or a large, rolled sock in your hand.  Squeeze as hard as you can without increasing any pain.  Hold this position for __________ seconds. Release your grip slowly. Repeat  __________ times. Complete this exercise __________ times per day.  Document Released: 04/28/2005 Document Revised: 07/21/2011 Document Reviewed: 08/10/2008 [facility] Patient Information 2013 [employer].

## 2012-07-05 ENCOUNTER — Encounter (HOSPITAL_COMMUNITY): Payer: Self-pay | Admitting: Emergency Medicine

## 2012-07-05 ENCOUNTER — Emergency Department (INDEPENDENT_AMBULATORY_CARE_PROVIDER_SITE_OTHER)
Admission: EM | Admit: 2012-07-05 | Discharge: 2012-07-05 | Disposition: A | Discharge status: Home / Self Care | Payer: Medicaid Other | Source: Home / Self Care | Attending: Emergency Medicine | Admitting: Emergency Medicine

## 2012-07-05 DIAGNOSIS — Z23 Encounter for immunization: Secondary | ICD-10-CM

## 2012-07-05 DIAGNOSIS — T148XXA Other injury of unspecified body region, initial encounter: Secondary | ICD-10-CM

## 2012-07-05 MED ORDER — MUPIROCIN 2 % EX OINT: TOPICAL_OINTMENT | Freq: Three times a day (TID) | CUTANEOUS | Status: DC

## 2012-07-05 MED ORDER — TETANUS-DIPHTH-ACELL PERTUSSIS 5-2.5-18.5 LF-MCG/0.5 IM SUSP
0.5000 mL | Freq: Once | INTRAMUSCULAR | Status: AC
  Administered 2012-07-05: 0.5 mL via INTRAMUSCULAR

## 2012-07-05 MED ORDER — TETANUS-DIPHTH-ACELL PERTUSSIS 5-2.5-18.5 LF-MCG/0.5 IM SUSP
INTRAMUSCULAR | Status: AC
  Filled 2012-07-05: qty 0.5
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
## 2013-01-21 ENCOUNTER — Encounter (HOSPITAL_COMMUNITY): Payer: Self-pay | Admitting: Emergency Medicine

## 2013-01-21 DIAGNOSIS — Z87448 Personal history of other diseases of urinary system: Secondary | ICD-10-CM | POA: Insufficient documentation

## 2013-01-21 DIAGNOSIS — F17200 Nicotine dependence, unspecified, uncomplicated: Secondary | ICD-10-CM | POA: Insufficient documentation

## 2013-01-21 DIAGNOSIS — Z8659 Personal history of other mental and behavioral disorders: Secondary | ICD-10-CM | POA: Insufficient documentation

## 2013-01-21 DIAGNOSIS — I129 Hypertensive chronic kidney disease with stage 1 through stage 4 chronic kidney disease, or unspecified chronic kidney disease: Secondary | ICD-10-CM | POA: Insufficient documentation

## 2013-01-21 DIAGNOSIS — N189 Chronic kidney disease, unspecified: Secondary | ICD-10-CM | POA: Insufficient documentation

## 2013-01-21 DIAGNOSIS — R1013 Epigastric pain: Secondary | ICD-10-CM | POA: Insufficient documentation

## 2013-01-21 DIAGNOSIS — R112 Nausea with vomiting, unspecified: Secondary | ICD-10-CM | POA: Insufficient documentation

## 2013-01-21 LAB — URINALYSIS, ROUTINE W REFLEX MICROSCOPIC
Bilirubin Urine: NEGATIVE
Nitrite: NEGATIVE
Specific Gravity, Urine: 1.009 (ref 1.005–1.030)
Urobilinogen, UA: 0.2 mg/dL (ref 0.0–1.0)

## 2013-01-21 LAB — POCT I-STAT, CHEM 8
Glucose, Bld: 82 mg/dL (ref 70–99)
HCT: 50 % (ref 39.0–52.0)
Hemoglobin: 17 g/dL (ref 13.0–17.0)
Potassium: 3.9 mEq/L (ref 3.5–5.1)
Sodium: 142 mEq/L (ref 135–145)

## 2013-01-21 LAB — CBC WITH DIFFERENTIAL/PLATELET
Basophils Absolute: 0 10*3/uL (ref 0.0–0.1)
Basophils Relative: 0 % (ref 0–1)
MCHC: 36.4 g/dL — ABNORMAL HIGH (ref 30.0–36.0)
Neutro Abs: 6.9 10*3/uL (ref 1.7–7.7)
Neutrophils Relative %: 79 % — ABNORMAL HIGH (ref 43–77)
Platelets: 137 10*3/uL — ABNORMAL LOW (ref 150–400)
RDW: 12.4 % (ref 11.5–15.5)

## 2013-01-21 LAB — URINE MICROSCOPIC-ADD ON

## 2013-01-21 NOTE — ED Notes (Signed)
Patient with nausea, vomiting and abdominal pain since 1600 this afternoon.  Patient states he has never had this before.

## 2013-01-21 NOTE — ED Notes (Signed)
Pt states unable to use the bathroom at this time, will try later

## 2013-01-22 ENCOUNTER — Emergency Department (HOSPITAL_COMMUNITY): Payer: Medicare Other

## 2013-01-22 ENCOUNTER — Emergency Department (HOSPITAL_COMMUNITY)
Admission: EM | Admit: 2013-01-22 | Discharge: 2013-01-22 | Disposition: A | Discharge status: Home / Self Care | Payer: Medicare Other | Attending: Emergency Medicine | Admitting: Emergency Medicine

## 2013-01-22 DIAGNOSIS — R112 Nausea with vomiting, unspecified: Secondary | ICD-10-CM

## 2013-01-22 DIAGNOSIS — R1013 Epigastric pain: Secondary | ICD-10-CM

## 2013-01-22 LAB — HEPATIC FUNCTION PANEL
ALT: 78 U/L — ABNORMAL HIGH (ref 0–53)
Bilirubin, Direct: 0.2 mg/dL (ref 0.0–0.3)
Indirect Bilirubin: 0.9 mg/dL (ref 0.3–0.9)

## 2013-01-22 MED ORDER — IOHEXOL 300 MG/ML  SOLN
25.0000 mL | INTRAMUSCULAR | Status: AC
  Administered 2013-01-22 (×2): 25 mL via ORAL

## 2013-01-22 MED ORDER — ONDANSETRON HCL 4 MG PO TABS: 4.0000 mg | ORAL_TABLET | Freq: Four times a day (QID) | ORAL | Status: DC

## 2013-01-22 MED ORDER — TRAMADOL HCL 50 MG PO TABS: 50.0000 mg | ORAL_TABLET | Freq: Four times a day (QID) | ORAL | Status: DC | PRN

## 2013-01-22 MED ORDER — PANTOPRAZOLE SODIUM 20 MG PO TBEC: 40.0000 mg | DELAYED_RELEASE_TABLET | Freq: Every day | ORAL | Status: DC

## 2013-01-22 MED ORDER — ONDANSETRON HCL 4 MG/2ML IJ SOLN
4.0000 mg | Freq: Once | INTRAMUSCULAR | Status: AC
  Administered 2013-01-22: 4 mg via INTRAVENOUS
  Filled 2013-01-22: qty 2

## 2013-01-22 MED ORDER — DICYCLOMINE HCL 10 MG PO CAPS
10.0000 mg | ORAL_CAPSULE | Freq: Once | ORAL | Status: AC
  Administered 2013-01-22: 10 mg via ORAL
  Filled 2013-01-22: qty 1

## 2013-01-22 MED ORDER — SODIUM CHLORIDE 0.9 % IV BOLUS (SEPSIS)
1000.0000 mL | Freq: Once | INTRAVENOUS | Status: AC
  Administered 2013-01-22: 1000 mL via INTRAVENOUS

## 2013-01-22 MED ORDER — MORPHINE SULFATE 4 MG/ML IJ SOLN
4.0000 mg | Freq: Once | INTRAMUSCULAR | Status: AC
  Administered 2013-01-22: 4 mg via INTRAVENOUS
  Filled 2013-01-22: qty 1

## 2013-01-22 NOTE — ED Provider Notes (Signed)
Results for orders placed during the hospital encounter of 01/22/13  CBC WITH DIFFERENTIAL      Result Value Range   WBC 8.8  4.0 - 10.5 K/uL   RBC 5.18  4.22 - 5.81 MIL/uL   Hemoglobin 16.9  13.0 - 17.0 g/dL   HCT 46.4  39.0 - 52.0 %   MCV 89.6  78.0 - 100.0 fL   MCH 32.6  26.0 - 34.0 pg   MCHC 36.4 (*) 30.0 - 36.0 g/dL   RDW 12.4  11.5 - 15.5 %   Platelets 137 (*) 150 - 400 K/uL   Neutrophils Relative % 79 (*) 43 - 77 %   Neutro Abs 6.9  1.7 - 7.7 K/uL   Lymphocytes Relative 13  12 - 46 %   Lymphs Abs 1.1  0.7 - 4.0 K/uL   Monocytes Relative 7  3 - 12 %   Monocytes Absolute 0.7  0.1 - 1.0 K/uL   Eosinophils Relative 1  0 - 5 %   Eosinophils Absolute 0.1  0.0 - 0.7 K/uL   Basophils Relative 0  0 - 1 %   Basophils Absolute 0.0  0.0 - 0.1 K/uL  URINALYSIS, ROUTINE W REFLEX MICROSCOPIC      Result Value Range   Color, Urine YELLOW  YELLOW   APPearance CLEAR  CLEAR   Specific Gravity, Urine 1.009  1.005 - 1.030   pH 7.0  5.0 - 8.0   Glucose, UA NEGATIVE  NEGATIVE mg/dL   Hgb urine dipstick NEGATIVE  NEGATIVE   Bilirubin Urine NEGATIVE  NEGATIVE   Ketones, ur NEGATIVE  NEGATIVE mg/dL   Protein, ur 100 (*) NEGATIVE mg/dL   Urobilinogen, UA 0.2  0.0 - 1.0 mg/dL   Nitrite NEGATIVE  NEGATIVE   Leukocytes, UA NEGATIVE  NEGATIVE  URINE MICROSCOPIC-ADD ON      Result Value Range   Squamous Epithelial / LPF RARE  RARE   WBC, UA 0-2  <3 WBC/hpf   RBC / HPF 0-2  <3 RBC/hpf  HEPATIC FUNCTION PANEL      Result Value Range   Total Protein 7.3  6.0 - 8.3 g/dL   Albumin 3.9  3.5 - 5.2 g/dL   AST 87 (*) 0 - 37 U/L   ALT 78 (*) 0 - 53 U/L   Alkaline Phosphatase 75  39 - 117 U/L   Total Bilirubin 1.1  0.3 - 1.2 mg/dL   Bilirubin, Direct 0.2  0.0 - 0.3 mg/dL   Indirect Bilirubin 0.9  0.3 - 0.9 mg/dL  LIPASE, BLOOD      Result Value Range   Lipase 56  11 - 59 U/L  POCT I-STAT, CHEM 8      Result Value Range   Sodium 142  135 - 145 mEq/L   Potassium 3.9  3.5 - 5.1 mEq/L   Chloride  103  96 - 112 mEq/L   BUN 19  6 - 23 mg/dL   Creatinine, Ser 2.60 (*) 0.50 - 1.35 mg/dL   Glucose, Bld 82  70 - 99 mg/dL   Calcium, Ion 1.19  1.12 - 1.23 mmol/L   TCO2 26  0 - 100 mmol/L   Hemoglobin 17.0  13.0 - 17.0 g/dL   HCT 50.0  39.0 - 52.0 %   US Abdomen Complete  01/22/2013   CLINICAL DATA:  Pain, tenderness to palpation  EXAM: ABDOMEN ULTRASOUND  COMPARISON:  04/15/2010  FINDINGS: Gallbladder  No gallstones, gallbladder wall thickening, or pericholecystic  fluid. Negative sonographic Murphy's sign.  Common bile duct  Diameter: 5 mm  Liver  No focal lesion identified. Within normal limits in parenchymal echogenicity.  IVC  No abnormality visualized.  Pancreas  Not visualized due to overlying bowel gas.  Spleen  Measures 4.2 cm.  Right Kidney  Length: 8.8 cm. Increased parenchymal echogenicity, suggesting medical renal disease. No mass or hydronephrosis.  Left Kidney  Length: 11.2 cm. Pancreas parenchymal echogenicity, suggesting medical renal disease. 1.7 x 1.3 x 1.8 cm lower pole cyst. No hydronephrosis.  Abdominal aorta  No aneurysm visualized.  IMPRESSION: Increased renal parenchymal echogenicity, suggesting medical renal disease. No hydronephrosis.  1.8 cm left lower pole renal cyst.  Otherwise negative abdominal ultrasound.   Electronically Signed   By: Julian Hy M.D.   On: 01/22/2013 10:08    Patient's ultrasound was unremarkable for gallbladder disease or acute liver disease. He was given a dose of Bentyl here in the ED and his pain is completely resolved. He has no abdominal tenderness on exam. He was given discharge papers per Dr. Cheri Guppy. He was advised to followup with his primary care physician.  Malvin Johns, MD 01/22/13 1114

## 2013-01-22 NOTE — ED Notes (Signed)
Pt finished drinking oral CT contrast, CT notified.

## 2013-01-22 NOTE — ED Notes (Signed)
Patient not in triage waiting at this time.

## 2013-01-22 NOTE — ED Provider Notes (Signed)
CSN: [id number]     Arrival date & time 01/21/13  2036 History   First MD Initiated Contact with Patient 01/22/13 0204     Chief Complaint  Patient presents with  . Abdominal Pain   (Consider location/radiation/quality/duration/timing/severity/associated sxs/prior Treatment) HPI  Patient is a pleasant 27 yo man with CKD and hearing disorder who presents with 2d of epigastric pain with nausea and 2 episodes of NBNB emesis. He denies fever. Pain is cramping, bilateral. Patient denies excacerbating or relieving factors. No fever. No bloody stools or diarrhea. No history of similar sx. Patient denies heavy alcohol and NSAID use. Denies history of pancreatitis or GB or liver disease.   Patient is s/p appendectomy, remotely. He denies GU sx. Pain 7/10 at the time of my exam.   Past Medical History  Diagnosis Date  . Hypertension   . Renal disorder   . Bipolar 1 disorder   . Depression    History reviewed. No pertinent past surgical history. No family history on file. History  Substance Use Topics  . Smoking status: Current Every Day Smoker  . Smokeless tobacco: Not on file  . Alcohol Use: No    Review of Systems Gen: no weight loss, fevers, chills, night sweats Eyes: no discharge or drainage, no occular pain or visual changes Nose: no epistaxis or rhinorrhea Mouth: no dental pain, no sore throat Neck: no neck pain Lungs: no SOB, cough, wheezing CV: no chest pain, palpitations, dependent edema or orthopnea Abd: As per history of present illness, otherwise negative GU: no dysuria or gross hematuria MSK: no myalgias or arthralgias Neuro: no headache, no focal neurologic deficits Skin: no rash Psyche: negative.  Allergies  Review of patient's allergies indicates no known allergies.  Home Medications   Current Outpatient Rx  Name  Route  Sig  Dispense  Refill  . calcium carbonate (TUMS - DOSED IN MG ELEMENTAL CALCIUM) 500 MG chewable tablet   Oral   Chew 2-3 tablets by  mouth daily as needed for heartburn. For heartburn          BP 133/52  Pulse 68  Temp(Src) 98.8 F (37.1 C) (Oral)  Resp 19  SpO2 100% Physical Exam Gen: well developed and well nourished appearing, the patient appeared to be sleeping when I enter the room. However, he was easily arousable and then began grimacing. Head: NCAT Eyes: PERL, EOMI Nose: no epistaixis or rhinorrhea Mouth/throat: mucosa is moist and pink Neck: supple, no stridor Lungs: CTA B, no wheezing, rhonchi or rales CV: Regular rate and rhythm, no murmur, extremities well perfused Abd: soft, nondistended, there is tenderness throughout the epigastrium which is most notable over the right upper quadrant. No peritoneal signs. Back: no ttp, no cva ttp Skin: no rashese, wnl Neuro: CN ii-xii grossly intact, no focal deficits Psyche; normal affect,  calm and cooperative.   ED Course  Procedures (including critical care time) Labs Review Results for orders placed during the hospital encounter of 01/22/13 (from the past 24 hour(s))  CBC WITH DIFFERENTIAL     Status: Abnormal   Collection Time    01/21/13  8:49 PM      Result Value Range   WBC 8.8  4.0 - 10.5 K/uL   RBC 5.18  4.22 - 5.81 MIL/uL   Hemoglobin 16.9  13.0 - 17.0 g/dL   HCT 46.4  39.0 - 52.0 %   MCV 89.6  78.0 - 100.0 fL   MCH 32.6  26.0 - 34.0 pg  MCHC 36.4 (*) 30.0 - 36.0 g/dL   RDW 12.4  11.5 - 15.5 %   Platelets 137 (*) 150 - 400 K/uL   Neutrophils Relative % 79 (*) 43 - 77 %   Neutro Abs 6.9  1.7 - 7.7 K/uL   Lymphocytes Relative 13  12 - 46 %   Lymphs Abs 1.1  0.7 - 4.0 K/uL   Monocytes Relative 7  3 - 12 %   Monocytes Absolute 0.7  0.1 - 1.0 K/uL   Eosinophils Relative 1  0 - 5 %   Eosinophils Absolute 0.1  0.0 - 0.7 K/uL   Basophils Relative 0  0 - 1 %   Basophils Absolute 0.0  0.0 - 0.1 K/uL  POCT I-STAT, CHEM 8     Status: Abnormal   Collection Time    01/21/13  9:04 PM      Result Value Range   Sodium 142  135 - 145 mEq/L    Potassium 3.9  3.5 - 5.1 mEq/L   Chloride 103  96 - 112 mEq/L   BUN 19  6 - 23 mg/dL   Creatinine, Ser 2.60 (*) 0.50 - 1.35 mg/dL   Glucose, Bld 82  70 - 99 mg/dL   Calcium, Ion 1.19  1.12 - 1.23 mmol/L   TCO2 26  0 - 100 mmol/L   Hemoglobin 17.0  13.0 - 17.0 g/dL   HCT 50.0  39.0 - 52.0 %  URINALYSIS, ROUTINE W REFLEX MICROSCOPIC     Status: Abnormal   Collection Time    01/21/13 10:31 PM      Result Value Range   Color, Urine YELLOW  YELLOW   APPearance CLEAR  CLEAR   Specific Gravity, Urine 1.009  1.005 - 1.030   pH 7.0  5.0 - 8.0   Glucose, UA NEGATIVE  NEGATIVE mg/dL   Hgb urine dipstick NEGATIVE  NEGATIVE   Bilirubin Urine NEGATIVE  NEGATIVE   Ketones, ur NEGATIVE  NEGATIVE mg/dL   Protein, ur 100 (*) NEGATIVE mg/dL   Urobilinogen, UA 0.2  0.0 - 1.0 mg/dL   Nitrite NEGATIVE  NEGATIVE   Leukocytes, UA NEGATIVE  NEGATIVE  URINE MICROSCOPIC-ADD ON     Status: None   Collection Time    01/21/13 10:31 PM      Result Value Range   Squamous Epithelial / LPF RARE  RARE   WBC, UA 0-2  <3 WBC/hpf   RBC / HPF 0-2  <3 RBC/hpf  HEPATIC FUNCTION PANEL     Status: Abnormal   Collection Time    01/22/13  6:20 AM      Result Value Range   Total Protein 7.3  6.0 - 8.3 g/dL   Albumin 3.9  3.5 - 5.2 g/dL   AST 87 (*) 0 - 37 U/L   ALT 78 (*) 0 - 53 U/L   Alkaline Phosphatase 75  39 - 117 U/L   Total Bilirubin 1.1  0.3 - 1.2 mg/dL   Bilirubin, Direct 0.2  0.0 - 0.3 mg/dL   Indirect Bilirubin 0.9  0.3 - 0.9 mg/dL  LIPASE, BLOOD     Status: None   Collection Time    01/22/13  6:20 AM      Result Value Range   Lipase 56  11 - 59 U/L     MDM  DDX: gastritis, PUD, GERD, pancreatitis, gallbladder disease, SBO, colitis, UTI, enteritis.   In light of localized pain to the RUQ, we will obtain  RUQ U/S to evaluate for biliary colic. No signs of obstruction. AST and ALT are insignificantly elevated. The patient is afebrile with normal WBC and generally nontoxic appearing.  He is tolerating  po.       Elyn Peers, MD 01/28/13 [phone]

## 2013-01-22 NOTE — ED Notes (Signed)
Pt returned from radiology.

## 2013-01-22 NOTE — ED Notes (Signed)
CT cancelled by Dr. Cheri Guppy @ [phone]- please note patient had completed PO prep for exam before order was changed to [place]

## 2013-04-29 ENCOUNTER — Encounter (HOSPITAL_COMMUNITY): Payer: Self-pay | Admitting: Emergency Medicine

## 2013-04-29 ENCOUNTER — Emergency Department (HOSPITAL_COMMUNITY): Payer: Medicare Other

## 2013-04-29 ENCOUNTER — Emergency Department (HOSPITAL_COMMUNITY)
Admission: EM | Admit: 2013-04-29 | Discharge: 2013-04-29 | Disposition: A | Discharge status: Home / Self Care | Payer: Medicare Other | Attending: Emergency Medicine | Admitting: Emergency Medicine

## 2013-04-29 DIAGNOSIS — I10 Essential (primary) hypertension: Secondary | ICD-10-CM | POA: Insufficient documentation

## 2013-04-29 DIAGNOSIS — Y9389 Activity, other specified: Secondary | ICD-10-CM | POA: Insufficient documentation

## 2013-04-29 DIAGNOSIS — Z87448 Personal history of other diseases of urinary system: Secondary | ICD-10-CM | POA: Insufficient documentation

## 2013-04-29 DIAGNOSIS — F3289 Other specified depressive episodes: Secondary | ICD-10-CM | POA: Insufficient documentation

## 2013-04-29 DIAGNOSIS — S4980XA Other specified injuries of shoulder and upper arm, unspecified arm, initial encounter: Secondary | ICD-10-CM | POA: Diagnosis not present

## 2013-04-29 DIAGNOSIS — S46909A Unspecified injury of unspecified muscle, fascia and tendon at shoulder and upper arm level, unspecified arm, initial encounter: Secondary | ICD-10-CM | POA: Insufficient documentation

## 2013-04-29 DIAGNOSIS — S0993XA Unspecified injury of face, initial encounter: Secondary | ICD-10-CM | POA: Diagnosis present

## 2013-04-29 DIAGNOSIS — Z79899 Other long term (current) drug therapy: Secondary | ICD-10-CM | POA: Insufficient documentation

## 2013-04-29 DIAGNOSIS — F329 Major depressive disorder, single episode, unspecified: Secondary | ICD-10-CM | POA: Insufficient documentation

## 2013-04-29 DIAGNOSIS — Y92410 Unspecified street and highway as the place of occurrence of the external cause: Secondary | ICD-10-CM | POA: Insufficient documentation

## 2013-04-29 DIAGNOSIS — F17200 Nicotine dependence, unspecified, uncomplicated: Secondary | ICD-10-CM | POA: Diagnosis not present

## 2013-04-29 DIAGNOSIS — IMO0002 Reserved for concepts with insufficient information to code with codable children: Secondary | ICD-10-CM | POA: Insufficient documentation

## 2013-04-29 HISTORY — DX: Unspecified hearing loss, right ear: H91.91

## 2013-04-29 HISTORY — DX: Unspecified hearing loss, left ear: H91.92

## 2013-04-29 MED ORDER — METHOCARBAMOL 500 MG PO TABS: 500.0000 mg | ORAL_TABLET | Freq: Two times a day (BID) | ORAL | Status: DC

## 2013-04-29 MED ORDER — IBUPROFEN 800 MG PO TABS: 800.0000 mg | ORAL_TABLET | Freq: Three times a day (TID) | ORAL | Status: DC

## 2013-04-29 MED ORDER — HYDROCODONE-ACETAMINOPHEN 5-325 MG PO TABS: 1.0000 | ORAL_TABLET | Freq: Four times a day (QID) | ORAL | Status: DC | PRN

## 2013-04-29 MED ORDER — OXYCODONE-ACETAMINOPHEN 5-325 MG PO TABS
1.0000 | ORAL_TABLET | Freq: Once | ORAL | Status: AC
  Administered 2013-04-29: 1 via ORAL
  Filled 2013-04-29: qty 1
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
## 2013-04-29 NOTE — ED Notes (Signed)
Aspen Collar applied to patient per [employer].
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
## 2013-04-30 NOTE — ED Provider Notes (Signed)
Medical screening examination/treatment/procedure(s) were performed by non-physician practitioner and as supervising physician I was immediately available for consultation/collaboration.  EKG Interpretation   None        Jasper Riling. Alvino Chapel, MD 04/30/13 [phone]

## 2013-05-25 ENCOUNTER — Emergency Department (INDEPENDENT_AMBULATORY_CARE_PROVIDER_SITE_OTHER)
Admission: EM | Admit: 2013-05-25 | Discharge: 2013-05-25 | Disposition: A | Discharge status: Home / Self Care | Payer: Medicare Other | Source: Home / Self Care | Attending: Family Medicine | Admitting: Family Medicine

## 2013-05-25 ENCOUNTER — Encounter (HOSPITAL_COMMUNITY): Payer: Self-pay | Admitting: Emergency Medicine

## 2013-05-25 DIAGNOSIS — J3489 Other specified disorders of nose and nasal sinuses: Secondary | ICD-10-CM

## 2013-05-25 MED ORDER — IPRATROPIUM BROMIDE 0.06 % NA SOLN: 2.0000 | Freq: Four times a day (QID) | NASAL | Status: DC

## 2013-05-25 MED ORDER — MUPIROCIN 2 % EX OINT: 1.0000 "application " | TOPICAL_OINTMENT | Freq: Two times a day (BID) | CUTANEOUS | Status: DC

## 2013-05-25 NOTE — ED Notes (Signed)
C/o pain in nose x 1 month ; NAD at present

## 2013-05-25 NOTE — ED Provider Notes (Signed)
[name] is a 28 y.o. male who presents to Urgent Care today for runny nose and nose pain and irritation for one month. No medications tried. No fevers chills nausea vomiting or diarrhea. Patient feels well otherwise. Symptoms are moderate and worse with rubbing.    Past Medical History  Diagnosis Date  . Hypertension   . Renal disorder   . Bipolar 1 disorder   . Depression   . Hearing difficulty of left ear     75% hearing  . Hearing disorder of right ear     50% hearing   History  Substance Use Topics  . Smoking status: Current Every Day Smoker  . Smokeless tobacco: Not on file  . Alcohol Use: No   ROS as above Medications: No current facility-administered medications for this encounter.   Current Outpatient Prescriptions  Medication Sig Dispense Refill  . ipratropium (ATROVENT) 0.06 % nasal spray Place 2 sprays into both nostrils 4 (four) times daily.  15 mL  5  . mupirocin ointment (BACTROBAN) 2 % Place 1 application into the nose 2 (two) times daily.  30 g  1    Exam:  BP 145/88  Pulse 58  Temp(Src) 98.6 F (37 C) (Oral)  Resp 15  SpO2 97% Gen: Well NAD HEENT: EOMI,  MMM inflamed nasal turbinates. No lesions noted posterior pharynx with cobblestoning. Tympanic membranes are normal appearing bilaterally. Lungs: Normal work of breathing. CTABL Heart: RRR no MRG Exts: Brisk capillary refill, warm and well perfused.    Assessment and Plan: 28 y.o. male with nasal irritation. Plan to use Atrovent nasal spray and mupirocin antibiotic ointment. If not improved followup with [facility] ear nose and throat.  Discussed warning signs or symptoms. Please see discharge instructions. Patient expresses understanding.    Gregor Hams, MD 05/25/13 2004

## 2013-05-25 NOTE — Discharge Instructions (Signed)
Thank you for coming in today. Use the nasal spray and ointment. If not getting better followup with [facility] ear nose and throat Call or go to the emergency room if you get worse, have trouble breathing, have chest pains, or palpitations.

## 2013-05-29 ENCOUNTER — Encounter (HOSPITAL_COMMUNITY): Payer: Self-pay | Admitting: Emergency Medicine

## 2013-05-29 ENCOUNTER — Emergency Department (HOSPITAL_COMMUNITY)
Admission: EM | Admit: 2013-05-29 | Discharge: 2013-05-29 | Disposition: A | Discharge status: Home / Self Care | Payer: Medicare Other | Attending: Emergency Medicine | Admitting: Emergency Medicine

## 2013-05-29 DIAGNOSIS — Z8659 Personal history of other mental and behavioral disorders: Secondary | ICD-10-CM | POA: Insufficient documentation

## 2013-05-29 DIAGNOSIS — Z8669 Personal history of other diseases of the nervous system and sense organs: Secondary | ICD-10-CM | POA: Insufficient documentation

## 2013-05-29 DIAGNOSIS — Z87448 Personal history of other diseases of urinary system: Secondary | ICD-10-CM | POA: Insufficient documentation

## 2013-05-29 DIAGNOSIS — Z792 Long term (current) use of antibiotics: Secondary | ICD-10-CM | POA: Insufficient documentation

## 2013-05-29 DIAGNOSIS — Z79899 Other long term (current) drug therapy: Secondary | ICD-10-CM | POA: Insufficient documentation

## 2013-05-29 DIAGNOSIS — M545 Low back pain, unspecified: Secondary | ICD-10-CM | POA: Insufficient documentation

## 2013-05-29 DIAGNOSIS — F17200 Nicotine dependence, unspecified, uncomplicated: Secondary | ICD-10-CM | POA: Insufficient documentation

## 2013-05-29 DIAGNOSIS — R300 Dysuria: Secondary | ICD-10-CM | POA: Insufficient documentation

## 2013-05-29 DIAGNOSIS — I10 Essential (primary) hypertension: Secondary | ICD-10-CM | POA: Insufficient documentation

## 2013-05-29 DIAGNOSIS — Z8619 Personal history of other infectious and parasitic diseases: Secondary | ICD-10-CM | POA: Insufficient documentation

## 2013-05-29 LAB — URINALYSIS, ROUTINE W REFLEX MICROSCOPIC
BILIRUBIN URINE: NEGATIVE
GLUCOSE, UA: NEGATIVE mg/dL
HGB URINE DIPSTICK: NEGATIVE
KETONES UR: NEGATIVE mg/dL
Leukocytes, UA: NEGATIVE
Nitrite: NEGATIVE
Specific Gravity, Urine: 1.015 (ref 1.005–1.030)
UROBILINOGEN UA: 0.2 mg/dL (ref 0.0–1.0)
pH: 5.5 (ref 5.0–8.0)

## 2013-05-29 LAB — URINE MICROSCOPIC-ADD ON

## 2013-05-29 LAB — POCT I-STAT, CHEM 8
BUN: 25 mg/dL — ABNORMAL HIGH (ref 6–23)
CALCIUM ION: 1.23 mmol/L (ref 1.12–1.23)
CHLORIDE: 105 meq/L (ref 96–112)
CREATININE: 2.1 mg/dL — AB (ref 0.50–1.35)
Glucose, Bld: 110 mg/dL — ABNORMAL HIGH (ref 70–99)
HEMATOCRIT: 47 % (ref 39.0–52.0)
Hemoglobin: 16 g/dL (ref 13.0–17.0)
Potassium: 3.8 mEq/L (ref 3.7–5.3)
Sodium: 141 mEq/L (ref 137–147)
TCO2: 22 mmol/L (ref 0–100)

## 2013-05-29 MED ORDER — METHOCARBAMOL 500 MG PO TABS: 500.0000 mg | ORAL_TABLET | Freq: Two times a day (BID) | ORAL | Status: DC

## 2013-05-29 MED ORDER — LIDOCAINE HCL 1 % IJ SOLN
INTRAMUSCULAR | Status: AC
Start: 2013-05-29 — End: 2013-05-29
  Administered 2013-05-29: 0.9 mL
  Filled 2013-05-29: qty 20

## 2013-05-29 MED ORDER — AZITHROMYCIN 250 MG PO TABS
1000.0000 mg | ORAL_TABLET | Freq: Once | ORAL | Status: AC
  Administered 2013-05-29: 1000 mg via ORAL
  Filled 2013-05-29: qty 4

## 2013-05-29 MED ORDER — CEFTRIAXONE SODIUM 250 MG IJ SOLR
250.0000 mg | Freq: Once | INTRAMUSCULAR | Status: AC
  Administered 2013-05-29: 250 mg via INTRAMUSCULAR
  Filled 2013-05-29: qty 250

## 2013-05-29 MED ORDER — TRAMADOL HCL 50 MG PO TABS: 50.0000 mg | ORAL_TABLET | Freq: Four times a day (QID) | ORAL | Status: DC | PRN

## 2013-05-29 NOTE — Discharge Instructions (Signed)
Back Pain, Adult Low back pain is very common. About 1 in 5 people have back pain.The cause of low back pain is rarely dangerous. The pain often gets better over time.About half of people with a sudden onset of back pain feel better in just 2 weeks. About 8 in 10 people feel better by 6 weeks.  CAUSES Some common causes of back pain include:  Strain of the muscles or ligaments supporting the spine.  Wear and tear (degeneration) of the spinal discs.  Arthritis.  Direct injury to the back. DIAGNOSIS Most of the time, the direct cause of low back pain is not known.However, back pain can be treated effectively even when the exact cause of the pain is unknown.Answering your caregiver's questions about your overall health and symptoms is one of the most accurate ways to make sure the cause of your pain is not dangerous. If your caregiver needs more information, he or she may order lab work or imaging tests (X-rays or MRIs).However, even if imaging tests show changes in your back, this usually does not require surgery. HOME CARE INSTRUCTIONS For many people, back pain returns.Since low back pain is rarely dangerous, it is often a condition that people can learn to manageon their own.   Remain active. It is stressful on the back to sit or stand in one place. Do not sit, drive, or stand in one place for more than 30 minutes at a time. Take short walks on level surfaces as soon as pain allows.Try to increase the length of time you walk each day.  Do not stay in bed.Resting more than 1 or 2 days can delay your recovery.  Do not avoid exercise or work.Your body is made to move.It is not dangerous to be active, even though your back may hurt.Your back will likely heal faster if you return to being active before your pain is gone.  Pay attention to your body when you bend and lift. Many people have less discomfortwhen lifting if they bend their knees, keep the load close to their bodies,and  avoid twisting. Often, the most comfortable positions are those that put less stress on your recovering back.  Find a comfortable position to sleep. Use a firm mattress and lie on your side with your knees slightly bent. If you lie on your back, put a pillow under your knees.  Only take over-the-counter or prescription medicines as directed by your caregiver. Over-the-counter medicines to reduce pain and inflammation are often the most helpful.Your caregiver may prescribe muscle relaxant drugs.These medicines help dull your pain so you can more quickly return to your normal activities and healthy exercise.  Put ice on the injured area.  Put ice in a plastic bag.  Place a towel between your skin and the bag.  Leave the ice on for 15-20 minutes, 03-04 times a day for the first 2 to 3 days. After that, ice and heat may be alternated to reduce pain and spasms.  Ask your caregiver about trying back exercises and gentle massage. This may be of some benefit.  Avoid feeling anxious or stressed.Stress increases muscle tension and can worsen back pain.It is important to recognize when you are anxious or stressed and learn ways to manage it.Exercise is a great option. SEEK MEDICAL CARE IF:  You have pain that is not relieved with rest or medicine.  You have pain that does not improve in 1 week.  You have new symptoms.  You are generally not feeling well. SEEK   IMMEDIATE MEDICAL CARE IF:   You have pain that radiates from your back into your legs.  You develop new bowel or bladder control problems.  You have unusual weakness or numbness in your arms or legs.  You develop nausea or vomiting.  You develop abdominal pain.  You feel faint. Document Released: 04/28/2005 Document Revised: 10/28/2011 Document Reviewed: 09/16/2010 [employer] Patient Information 2014 [employer].  
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
## 2013-05-29 NOTE — ED Provider Notes (Addendum)
Pt has hx of Alports syndrome. He reports left sided flank/back pain for the past 1-2 weeks. He states the pain is worse with ROM and walking. He denies fever, SOB, hematuria.   Pt is alert and cooperative. He has some tenderness in his lateral left flank/back area. He is in NAD.    Medical screening examination/treatment/procedure(s) were conducted as a shared visit with non-physician practitioner(s) and myself.  I personally evaluated the patient during the encounter.  EKG Interpretation   None         Rolland Porter, MD, Abram Sander   Janice Norrie, MD 05/29/13 [phone]
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
## 2013-05-30 LAB — GC/CHLAMYDIA PROBE AMP
CT PROBE, AMP APTIMA: NEGATIVE
GC Probe RNA: NEGATIVE

## 2013-07-16 ENCOUNTER — Encounter (HOSPITAL_COMMUNITY): Payer: Self-pay | Admitting: Emergency Medicine

## 2013-07-16 ENCOUNTER — Emergency Department (HOSPITAL_COMMUNITY)
Admission: EM | Admit: 2013-07-16 | Discharge: 2013-07-16 | Disposition: A | Discharge status: Home / Self Care | Payer: Medicare Other | Attending: Emergency Medicine | Admitting: Emergency Medicine

## 2013-07-16 DIAGNOSIS — R5383 Other fatigue: Secondary | ICD-10-CM

## 2013-07-16 DIAGNOSIS — F17200 Nicotine dependence, unspecified, uncomplicated: Secondary | ICD-10-CM | POA: Insufficient documentation

## 2013-07-16 DIAGNOSIS — I129 Hypertensive chronic kidney disease with stage 1 through stage 4 chronic kidney disease, or unspecified chronic kidney disease: Secondary | ICD-10-CM | POA: Insufficient documentation

## 2013-07-16 DIAGNOSIS — R509 Fever, unspecified: Secondary | ICD-10-CM | POA: Insufficient documentation

## 2013-07-16 DIAGNOSIS — K0889 Other specified disorders of teeth and supporting structures: Secondary | ICD-10-CM

## 2013-07-16 DIAGNOSIS — R519 Headache, unspecified: Secondary | ICD-10-CM

## 2013-07-16 DIAGNOSIS — H6693 Otitis media, unspecified, bilateral: Secondary | ICD-10-CM

## 2013-07-16 DIAGNOSIS — H6690 Otitis media, unspecified, unspecified ear: Secondary | ICD-10-CM | POA: Insufficient documentation

## 2013-07-16 DIAGNOSIS — R5381 Other malaise: Secondary | ICD-10-CM | POA: Insufficient documentation

## 2013-07-16 DIAGNOSIS — R51 Headache: Secondary | ICD-10-CM | POA: Insufficient documentation

## 2013-07-16 DIAGNOSIS — F319 Bipolar disorder, unspecified: Secondary | ICD-10-CM | POA: Insufficient documentation

## 2013-07-16 DIAGNOSIS — N289 Disorder of kidney and ureter, unspecified: Secondary | ICD-10-CM | POA: Insufficient documentation

## 2013-07-16 MED ORDER — AMOXICILLIN 500 MG PO CAPS
500.0000 mg | ORAL_CAPSULE | Freq: Once | ORAL | Status: AC
  Administered 2013-07-16: 500 mg via ORAL
  Filled 2013-07-16: qty 1

## 2013-07-16 MED ORDER — IBUPROFEN 800 MG PO TABS: 800.0000 mg | ORAL_TABLET | Freq: Three times a day (TID) | ORAL | Status: DC | PRN

## 2013-07-16 MED ORDER — IBUPROFEN 800 MG PO TABS
800.0000 mg | ORAL_TABLET | Freq: Once | ORAL | Status: AC
Start: 2013-07-16 — End: 2013-07-16
  Administered 2013-07-16: 800 mg via ORAL
  Filled 2013-07-16: qty 1

## 2013-07-16 MED ORDER — ANTIPYRINE-BENZOCAINE 5.4-1.4 % OT SOLN
3.0000 [drp] | OTIC | Status: DC | PRN
  Administered 2013-07-16: 3 [drp] via OTIC
  Filled 2013-07-16: qty 10

## 2013-07-16 MED ORDER — AMOXICILLIN 500 MG PO CAPS: 500.0000 mg | ORAL_CAPSULE | Freq: Three times a day (TID) | ORAL | Status: DC

## 2013-07-16 NOTE — Discharge Instructions (Signed)
Dental Pain A tooth ache may be caused by cavities (tooth decay). Cavities expose the nerve of the tooth to air and hot or cold temperatures. It may come from an infection or abscess (also called a boil or furuncle) around your tooth. It is also often caused by dental caries (tooth decay). This causes the pain you are having. DIAGNOSIS  Your caregiver can diagnose this problem by exam. TREATMENT   If caused by an infection, it may be treated with medications which kill germs (antibiotics) and pain medications as prescribed by your caregiver. Take medications as directed.  Only take over-the-counter or prescription medicines for pain, discomfort, or fever as directed by your caregiver.  Whether the tooth ache today is caused by infection or dental disease, you should see your dentist as soon as possible for further care. SEEK MEDICAL CARE IF: The exam and treatment you received today has been provided on an emergency basis only. This is not a substitute for complete medical or dental care. If your problem worsens or new problems (symptoms) appear, and you are unable to meet with your dentist, call or return to this location. SEEK IMMEDIATE MEDICAL CARE IF:   You have a fever.  You develop redness and swelling of your face, jaw, or neck.  You are unable to open your mouth.  You have severe pain uncontrolled by pain medicine. MAKE SURE YOU:   Understand these instructions.  Will watch your condition.  Will get help right away if you are not doing well or get worse. Document Released: 04/28/2005 Document Revised: 07/21/2011 Document Reviewed: 12/15/2007 [facility] Patient Information 2014 [place].  Headaches, Frequently Asked Questions MIGRAINE HEADACHES Q: What is migraine? What causes it? How can I treat it? A: Generally, migraine headaches begin as a dull ache. Then they develop into a constant, throbbing, and pulsating pain. You may experience pain at the temples. You may  experience pain at the front or back of one or both sides of the head. The pain is usually accompanied by a combination of:  Nausea.  Vomiting.  Sensitivity to light and noise. Some people (about 15%) experience an aura (see below) before an attack. The cause of migraine is believed to be chemical reactions in the brain. Treatment for migraine may include over-the-counter or prescription medications. It may also include self-help techniques. These include relaxation training and biofeedback.  Q: What is an aura? A: About 15% of people with migraine get an "aura". This is a sign of neurological symptoms that occur before a migraine headache. You may see wavy or jagged lines, dots, or flashing lights. You might experience tunnel vision or blind spots in one or both eyes. The aura can include visual or auditory hallucinations (something imagined). It may include disruptions in smell (such as strange odors), taste or touch. Other symptoms include:  Numbness.  A "pins and needles" sensation.  Difficulty in recalling or speaking the correct word. These neurological events may last as long as 60 minutes. These symptoms will fade as the headache begins. Q: What is a trigger? A: Certain physical or environmental factors can lead to or "trigger" a migraine. These include:  Foods.  Hormonal changes.  Weather.  Stress. It is important to remember that triggers are different for everyone. To help prevent migraine attacks, you need to figure out which triggers affect you. Keep a headache diary. This is a good way to track triggers. The diary will help you talk to your healthcare professional about your condition.  Q: Does weather affect migraines? A: Bright sunshine, hot, humid conditions, and drastic changes in barometric pressure may lead to, or "trigger," a migraine attack in some people. But studies have shown that weather does not act as a trigger for everyone with migraines. Q: What is the link  between migraine and hormones? A: Hormones start and regulate many of your body's functions. Hormones keep your body in balance within a constantly changing environment. The levels of hormones in your body are unbalanced at times. Examples are during menstruation, pregnancy, or menopause. That can lead to a migraine attack. In fact, about three quarters of all women with migraine report that their attacks are related to the menstrual cycle.  Q: Is there an increased risk of stroke for migraine sufferers? A: The likelihood of a migraine attack causing a stroke is very remote. That is not to say that migraine sufferers cannot have a stroke associated with their migraines. In persons under age 71, the most common associated factor for stroke is migraine headache. But over the course of a person's normal life span, the occurrence of migraine headache may actually be associated with a reduced risk of dying from cerebrovascular disease due to stroke.  Q: What are acute medications for migraine? A: Acute medications are used to treat the pain of the headache after it has started. Examples over-the-counter medications, NSAIDs, ergots, and triptans.  Q: What are the triptans? A: Triptans are the newest class of abortive medications. They are specifically targeted to treat migraine. Triptans are vasoconstrictors. They moderate some chemical reactions in the brain. The triptans work on receptors in your brain. Triptans help to restore the balance of a neurotransmitter called serotonin. Fluctuations in levels of serotonin are thought to be a main cause of migraine.  Q: Are over-the-counter medications for migraine effective? A: Over-the-counter, or "OTC," medications may be effective in relieving mild to moderate pain and associated symptoms of migraine. But you should see your caregiver before beginning any treatment regimen for migraine.  Q: What are preventive medications for migraine? A: Preventive medications  for migraine are sometimes referred to as "prophylactic" treatments. They are used to reduce the frequency, severity, and length of migraine attacks. Examples of preventive medications include antiepileptic medications, antidepressants, beta-blockers, calcium channel blockers, and NSAIDs (nonsteroidal anti-inflammatory drugs). Q: Why are anticonvulsants used to treat migraine? A: During the past few years, there has been an increased interest in antiepileptic drugs for the prevention of migraine. They are sometimes referred to as "anticonvulsants". Both epilepsy and migraine may be caused by similar reactions in the brain.  Q: Why are antidepressants used to treat migraine? A: Antidepressants are typically used to treat people with depression. They may reduce migraine frequency by regulating chemical levels, such as serotonin, in the brain.  Q: What alternative therapies are used to treat migraine? A: The term "alternative therapies" is often used to describe treatments considered outside the scope of conventional Western medicine. Examples of alternative therapy include acupuncture, acupressure, and yoga. Another common alternative treatment is herbal therapy. Some herbs are believed to relieve headache pain. Always discuss alternative therapies with your caregiver before proceeding. Some herbal products contain arsenic and other toxins. TENSION HEADACHES Q: What is a tension-type headache? What causes it? How can I treat it? A: Tension-type headaches occur randomly. They are often the result of temporary stress, anxiety, fatigue, or anger. Symptoms include soreness in your temples, a tightening band-like sensation around your head (a "vice-like" ache). Symptoms can also include  a pulling feeling, pressure sensations, and contracting head and neck muscles. The headache begins in your forehead, temples, or the back of your head and neck. Treatment for tension-type headache may include over-the-counter or  prescription medications. Treatment may also include self-help techniques such as relaxation training and biofeedback. CLUSTER HEADACHES Q: What is a cluster headache? What causes it? How can I treat it? A: Cluster headache gets its name because the attacks come in groups. The pain arrives with little, if any, warning. It is usually on one side of the head. A tearing or bloodshot eye and a runny nose on the same side of the headache may also accompany the pain. Cluster headaches are believed to be caused by chemical reactions in the brain. They have been described as the most severe and intense of any headache type. Treatment for cluster headache includes prescription medication and oxygen. SINUS HEADACHES Q: What is a sinus headache? What causes it? How can I treat it? A: When a cavity in the bones of the face and skull (a sinus) becomes inflamed, the inflammation will cause localized pain. This condition is usually the result of an allergic reaction, a tumor, or an infection. If your headache is caused by a sinus blockage, such as an infection, you will probably have a fever. An x-ray will confirm a sinus blockage. Your caregiver's treatment might include antibiotics for the infection, as well as antihistamines or decongestants.  REBOUND HEADACHES Q: What is a rebound headache? What causes it? How can I treat it? A: A pattern of taking acute headache medications too often can lead to a condition known as "rebound headache." A pattern of taking too much headache medication includes taking it more than 2 days per week or in excessive amounts. That means more than the label or a caregiver advises. With rebound headaches, your medications not only stop relieving pain, they actually begin to cause headaches. Doctors treat rebound headache by tapering the medication that is being overused. Sometimes your caregiver will gradually substitute a different type of treatment or medication. Stopping may be a  challenge. Regularly overusing a medication increases the potential for serious side effects. Consult a caregiver if you regularly use headache medications more than 2 days per week or more than the label advises. ADDITIONAL QUESTIONS AND ANSWERS Q: What is biofeedback? A: Biofeedback is a self-help treatment. Biofeedback uses special equipment to monitor your body's involuntary physical responses. Biofeedback monitors:  Breathing.  Pulse.  Heart rate.  Temperature.  Muscle tension.  Brain activity. Biofeedback helps you refine and perfect your relaxation exercises. You learn to control the physical responses that are related to stress. Once the technique has been mastered, you do not need the equipment any more. Q: Are headaches hereditary? A: Four out of five (80%) of people that suffer report a family history of migraine. Scientists are not sure if this is genetic or a family predisposition. Despite the uncertainty, a child has a 50% chance of having migraine if one parent suffers. The child has a 75% chance if both parents suffer.  Q: Can children get headaches? A: By the time they reach high school, most young people have experienced some type of headache. Many safe and effective approaches or medications can prevent a headache from occurring or stop it after it has begun.  Q: What type of doctor should I see to diagnose and treat my headache? A: Start with your primary caregiver. Discuss his or her experience and approach to headaches. Discuss  methods of classification, diagnosis, and treatment. Your caregiver may decide to recommend you to a headache specialist, depending upon your symptoms or other physical conditions. Having diabetes, allergies, etc., may require a more comprehensive and inclusive approach to your headache. The National Headache Foundation will provide, upon request, a list of [facility] physician members in your state. Document Released: 07/19/2003 Document Revised:  07/21/2011 Document Reviewed: 12/27/2007 [facility] Patient Information 2014 [place].  Otitis Media, Adult Otitis media is redness, soreness, and swelling (inflammation) of the middle ear. Otitis media may be caused by allergies or, most commonly, by infection. Often it occurs as a complication of the common cold. SIGNS AND SYMPTOMS Symptoms of otitis media may include:  Earache.  Fever.  Ringing in your ear.  Headache.  Leakage of fluid from the ear. DIAGNOSIS To diagnose otitis media, your health care provider will examine your ear with an otoscope. This is an instrument that allows your health care provider to see into your ear in order to examine your eardrum. Your health care provider also will ask you questions about your symptoms. TREATMENT  Typically, otitis media resolves on its own within 3 5 days. Your health care provider may prescribe medicine to ease your symptoms of pain. If otitis media does not resolve within 5 days or is recurrent, your health care provider may prescribe antibiotic medicines if he or she suspects that a bacterial infection is the cause. HOME CARE INSTRUCTIONS   Take your medicine as directed until it is gone, even if you feel better after the first few days.  Only take over-the-counter or prescription medicines for pain, discomfort, or fever as directed by your health care provider.  Follow up with your health care provider as directed. SEEK MEDICAL CARE IF:  You have otitis media only in one ear or bleeding from your nose or both.  You notice a lump on your neck.  You are not getting better in 3 5 days.  You feel worse instead of better. SEEK IMMEDIATE MEDICAL CARE IF:   You have pain that is not controlled with medicine.  You have swelling, redness, or pain around your ear or stiffness in your neck.  You notice that part of your face is paralyzed.  You notice that the bone behind your ear (mastoid) is tender when you touch  it. MAKE SURE YOU:   Understand these instructions.  Will watch your condition.  Will get help right away if you are not doing well or get worse. Document Released: 02/01/2004 Document Revised: 02/16/2013 Document Reviewed: 11/23/2012 [facility] Patient Information 2014 [place], [place].

## 2013-07-16 NOTE — ED Notes (Signed)
Pt from home c/o bilateral ear pain, HA,  4 days post-op wisdom teeth removal. Pt denies N/V/D. Pt is A&O and in NAD

## 2013-07-16 NOTE — ED Provider Notes (Signed)
Medical screening examination/treatment/procedure(s) were performed by non-physician practitioner and as supervising physician I was immediately available for consultation/collaboration.   EKG Interpretation None      \  [place], DO 07/16/13 2258

## 2013-07-16 NOTE — ED Provider Notes (Signed)
CSN: [id number]     Arrival date & time 07/16/13  1907 History  This chart was scribed for non-physician practitioner working with [place], DO by Stacy Gardner, ED scribe. This patient was seen in room WTR6/WTR6 and the patient's care was started at 8:20 PM.  None    Chief Complaint  Patient presents with  . Otalgia  . Dental Pain  . Headache     (Consider location/radiation/quality/duration/timing/severity/associated sxs/prior Treatment) Patient is a 28 y.o. male presenting with tooth pain. The history is provided by the patient, medical records and the spouse. No language interpreter was used.  Dental Pain Severity:  Moderate Onset quality:  Sudden Duration:  4 days Timing:  Constant Progression:  Unchanged Chronicity:  New Context: recent dental surgery   Associated symptoms: headaches   Risk factors: smoking    HPI Comments: [name] is a 28 y.o. male who presents to the Emergency Department complaining of constant, moderate dental pain, onset of yesterday. He also complains of associated bilateral severe otalgia and headache. Pt had recent oral surgery to extract his wisdom teeth four days ago.  Pt had a low grade fever of 99.8 last night. Denies throat pain. Denies nausea, vomiting and diarrhea. Nothing seems to resolve his symptoms. Pt has difficulty hearing bilaterally. Pt is not taking any antibiotics. He is currently taking oxycodone.  Past Medical History  Diagnosis Date  . Hypertension   . Renal disorder   . Bipolar 1 disorder   . Depression   . Hearing difficulty of left ear     75% hearing  . Hearing disorder of right ear     50% hearing   Past Surgical History  Procedure Laterality Date  . Appendectomy    . Spinal tap     No family history on file. History  Substance Use Topics  . Smoking status: Current Every Day Smoker  . Smokeless tobacco: Not on file  . Alcohol Use: No    Review of Systems  HENT: Positive for dental problem and  ear pain.   Gastrointestinal: Negative for vomiting and diarrhea.  Neurological: Positive for weakness and headaches.  All other systems reviewed and are negative.      Allergies  Review of patient's allergies indicates no known allergies.  Home Medications   Current Outpatient Rx  Name  Route  Sig  Dispense  Refill  . oxyCODONE-acetaminophen (PERCOCET/ROXICET) 5-325 MG per tablet   Oral   Take 1 tablet by mouth every 6 (six) hours as needed for severe pain.          BP 148/88  Pulse 79  Temp(Src) 99 F (37.2 C) (Oral)  Resp 18  SpO2 96% Physical Exam  Nursing note and vitals reviewed. Constitutional: He is oriented to person, place, and time. He appears well-developed and well-nourished. No distress.  HENT:  Head: Normocephalic and atraumatic.  Nose: Nose normal.  Mouth/Throat: Oropharynx is clear and moist. No oropharyngeal exudate.  Bilateral TM with erythema and bulge - sutures intact to bilateral upper and lower wisdom teeth, posterior pharynx without erythema or exudate.  Eyes: Conjunctivae are normal. Pupils are equal, round, and reactive to light. No scleral icterus.  Neck: Normal range of motion. Neck supple.  Cardiovascular: Normal rate, regular rhythm and normal heart sounds.  Exam reveals no gallop and no friction rub.   No murmur heard. Pulmonary/Chest: Effort normal and breath sounds normal. No respiratory distress. He has no wheezes. He has no rales. He exhibits  no tenderness.  Abdominal: Soft. Bowel sounds are normal. He exhibits no distension. There is no tenderness.  Musculoskeletal: Normal range of motion. He exhibits no edema and no tenderness.  Lymphadenopathy:    He has no cervical adenopathy.  Neurological: [name] and oriented to person, place, and time. He exhibits normal muscle tone. Coordination normal.  Skin: Skin is warm and dry. No rash noted. No erythema. No pallor.  Psychiatric: He has a normal mood and affect. His behavior is  normal. Judgment and thought content normal.    ED Course  Procedures (including critical care time) DIAGNOSTIC STUDIES: Oxygen Saturation is 96% on room air, normal by my interpretation.    COORDINATION OF CARE:  8:22 PM Discussed course of care with pt . Pt understands and agrees.   Labs Review Labs Reviewed - No data to display Imaging Review No results found.   EKG Interpretation None      MDM   Bilateral otitis media Headache   Patient here with bilateral ear pain for the past 24 hours - low grade temperature, dental issues look to be healing.  No evidence of trismus, PTA, ludwig's angina.  I personally performed the services described in this documentation, which was scribed in my presence. The recorded information has been reviewed and is accurate.    Idalia Needle Joelyn Oms, [place] 07/16/13 2035

## 2013-08-31 ENCOUNTER — Emergency Department (HOSPITAL_COMMUNITY)
Admission: EM | Admit: 2013-08-31 | Discharge: 2013-09-01 | Disposition: A | Discharge status: Home / Self Care | Payer: Medicare Other | Attending: Emergency Medicine | Admitting: Emergency Medicine

## 2013-08-31 ENCOUNTER — Encounter (HOSPITAL_COMMUNITY): Payer: Self-pay | Admitting: Emergency Medicine

## 2013-08-31 DIAGNOSIS — Z87448 Personal history of other diseases of urinary system: Secondary | ICD-10-CM | POA: Insufficient documentation

## 2013-08-31 DIAGNOSIS — F911 Conduct disorder, childhood-onset type: Secondary | ICD-10-CM | POA: Insufficient documentation

## 2013-08-31 DIAGNOSIS — T71162A Asphyxiation due to hanging, intentional self-harm, initial encounter: Secondary | ICD-10-CM | POA: Insufficient documentation

## 2013-08-31 DIAGNOSIS — X789XXA Intentional self-harm by unspecified sharp object, initial encounter: Secondary | ICD-10-CM | POA: Insufficient documentation

## 2013-08-31 DIAGNOSIS — I10 Essential (primary) hypertension: Secondary | ICD-10-CM | POA: Insufficient documentation

## 2013-08-31 DIAGNOSIS — S51809A Unspecified open wound of unspecified forearm, initial encounter: Secondary | ICD-10-CM | POA: Insufficient documentation

## 2013-08-31 DIAGNOSIS — Z8669 Personal history of other diseases of the nervous system and sense organs: Secondary | ICD-10-CM | POA: Insufficient documentation

## 2013-08-31 DIAGNOSIS — Z7289 Other problems related to lifestyle: Secondary | ICD-10-CM | POA: Diagnosis present

## 2013-08-31 DIAGNOSIS — F319 Bipolar disorder, unspecified: Secondary | ICD-10-CM | POA: Diagnosis present

## 2013-08-31 DIAGNOSIS — F17200 Nicotine dependence, unspecified, uncomplicated: Secondary | ICD-10-CM | POA: Insufficient documentation

## 2013-08-31 DIAGNOSIS — F1210 Cannabis abuse, uncomplicated: Secondary | ICD-10-CM | POA: Insufficient documentation

## 2013-08-31 NOTE — ED Notes (Signed)
Pt here voluntarily with [employer], mother currently taking out IVC papers, pt sent texts ad pictures of himself cutting himself and trying to hang himself with his belt to his sister

## 2013-09-01 ENCOUNTER — Encounter (HOSPITAL_COMMUNITY): Payer: Self-pay | Admitting: Psychiatry

## 2013-09-01 DIAGNOSIS — F319 Bipolar disorder, unspecified: Secondary | ICD-10-CM | POA: Diagnosis present

## 2013-09-01 DIAGNOSIS — T71162A Asphyxiation due to hanging, intentional self-harm, initial encounter: Secondary | ICD-10-CM

## 2013-09-01 DIAGNOSIS — F489 Nonpsychotic mental disorder, unspecified: Secondary | ICD-10-CM

## 2013-09-01 DIAGNOSIS — F3130 Bipolar disorder, current episode depressed, mild or moderate severity, unspecified: Secondary | ICD-10-CM

## 2013-09-01 DIAGNOSIS — X789XXA Intentional self-harm by unspecified sharp object, initial encounter: Secondary | ICD-10-CM

## 2013-09-01 DIAGNOSIS — Z7289 Other problems related to lifestyle: Secondary | ICD-10-CM | POA: Diagnosis present

## 2013-09-01 LAB — CBC
HEMATOCRIT: 46.4 % (ref 39.0–52.0)
Hemoglobin: 16.3 g/dL (ref 13.0–17.0)
MCH: 31.8 pg (ref 26.0–34.0)
MCHC: 35.1 g/dL (ref 30.0–36.0)
MCV: 90.6 fL (ref 78.0–100.0)
Platelets: 160 10*3/uL (ref 150–400)
RBC: 5.12 MIL/uL (ref 4.22–5.81)
RDW: 12.4 % (ref 11.5–15.5)
WBC: 5 10*3/uL (ref 4.0–10.5)

## 2013-09-01 LAB — ACETAMINOPHEN LEVEL

## 2013-09-01 LAB — RAPID URINE DRUG SCREEN, HOSP PERFORMED
AMPHETAMINES: NOT DETECTED
BENZODIAZEPINES: NOT DETECTED
Barbiturates: NOT DETECTED
Cocaine: NOT DETECTED
Opiates: NOT DETECTED
Tetrahydrocannabinol: POSITIVE — AB

## 2013-09-01 LAB — COMPREHENSIVE METABOLIC PANEL
ALBUMIN: 4 g/dL (ref 3.5–5.2)
ALK PHOS: 81 U/L (ref 39–117)
ALT: 18 U/L (ref 0–53)
AST: 29 U/L (ref 0–37)
BUN: 24 mg/dL — ABNORMAL HIGH (ref 6–23)
CO2: 21 meq/L (ref 19–32)
Calcium: 9.7 mg/dL (ref 8.4–10.5)
Chloride: 104 mEq/L (ref 96–112)
Creatinine, Ser: 2.46 mg/dL — ABNORMAL HIGH (ref 0.50–1.35)
GFR calc Af Amer: 40 mL/min — ABNORMAL LOW (ref >90)
GFR, EST NON AFRICAN AMERICAN: 35 mL/min — AB (ref >90)
Glucose, Bld: 95 mg/dL (ref 70–99)
POTASSIUM: 4.1 meq/L (ref 3.7–5.3)
Sodium: 141 mEq/L (ref 137–147)
Total Bilirubin: 0.6 mg/dL (ref 0.3–1.2)
Total Protein: 7.3 g/dL (ref 6.0–8.3)

## 2013-09-01 LAB — SALICYLATE LEVEL: Salicylate Lvl: <2 mg/dL — ABNORMAL LOW (ref 2.8–20.0)

## 2013-09-01 LAB — ETHANOL: Alcohol, Ethyl (B): <11 mg/dL (ref 0–11)

## 2013-09-01 MED ORDER — ALUM & MAG HYDROXIDE-SIMETH 200-200-20 MG/5ML PO SUSP: 30.0000 mL | ORAL | Status: DC | PRN

## 2013-09-01 MED ORDER — ONDANSETRON HCL 4 MG PO TABS: 4.0000 mg | ORAL_TABLET | Freq: Three times a day (TID) | ORAL | Status: DC | PRN

## 2013-09-01 MED ORDER — IBUPROFEN 200 MG PO TABS: 600.0000 mg | ORAL_TABLET | Freq: Three times a day (TID) | ORAL | Status: DC | PRN

## 2013-09-01 MED ORDER — LORAZEPAM 1 MG PO TABS
1.0000 mg | ORAL_TABLET | Freq: Three times a day (TID) | ORAL | Status: DC | PRN
Start: 2013-09-01 — End: 2013-09-01
  Filled 2013-09-01: qty 1

## 2013-09-01 MED ORDER — ACETAMINOPHEN 325 MG PO TABS
650.0000 mg | ORAL_TABLET | ORAL | Status: DC | PRN
  Filled 2013-09-01: qty 2

## 2013-09-01 MED ORDER — NICOTINE 21 MG/24HR TD PT24: 21.0000 mg | MEDICATED_PATCH | Freq: Every day | TRANSDERMAL | Status: DC

## 2013-09-01 NOTE — ED Notes (Signed)
ACT into see 
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
## 2013-09-01 NOTE — ED Notes (Signed)
Kim-case management into see

## 2013-09-01 NOTE — Progress Notes (Addendum)
Per, Delphia Grates, NP patient is psychiatrically stable and will be discharged.   The Psychiatrist signed the rescinding of IVC papers.  Writer faxed papers to the [facility] and gave the original to the nurse.     The patient has an appointment with outpatient services with [facility] on Monday 27, 2015 at 8:45am.

## 2013-09-01 NOTE — ED Notes (Signed)
Pts behavior escalating, pt yelling at [facility] to be returned home. Pt did have visit from sister which resulted in loud verbal altercation. IVC papers being taken out by PA/MD

## 2013-09-01 NOTE — ED Notes (Signed)
MD and Delphia Grates NP into see

## 2013-09-01 NOTE — ED Notes (Signed)
Up to the desk waiting for dc instructions

## 2013-09-01 NOTE — ED Notes (Signed)
Up to the desk to call for ride

## 2013-09-01 NOTE — ED Notes (Signed)
Up in room watching tv, smiling, pleasant, calm, cooperative.
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
## 2013-09-01 NOTE — ED Notes (Signed)
LAC lt forearm cleaned w/NS, dressing applied.  S/S of infection reviewed w/ pt, patient verbalized understanding.

## 2013-09-01 NOTE — ED Notes (Addendum)
Ride is here. Written dc instructions, No-harm contract, and IOP follow up information reviewed w/ Pt.  Pt verbalized understanding.  Pt encouraged to follow up as directed and to seek treatment/return for any return of suicidal thoughts/urges.  Pt verbalized understanding.  Pt ambulatory to dc window w/ mHt, belongings returned after leaving the unit.  Case management still trying to see it there are any funds/programs that could be available to assist patient in getting hearing aids, she will contact patient at home after dc.

## 2013-09-01 NOTE — ED Notes (Signed)
Up to the des on the phone
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
## 2013-09-01 NOTE — ED Notes (Signed)
IVC rescinded 

## 2013-09-01 NOTE — ED Notes (Signed)
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 States his child's mother depends on him to watch the child while he is at work. States he would never harm himself in front of his child. Insists TTS and Dr be aware. Patient reminded that Psychiatry will not round until morning.  Patient safety maintained, Q 15 checks continue.

## 2013-09-01 NOTE — ED Provider Notes (Signed)
CSN: [id number]     Arrival date & time 08/31/13  2351 History   First MD Initiated Contact with Patient 09/01/13 0016     Chief Complaint  Patient presents with  . Medical Clearance  . Suicidal     (Consider location/radiation/quality/duration/timing/severity/associated sxs/prior Treatment) HPI  [name] Is a 28 year old male with past medical history of hypertension, bipolar disorder, depression, hearing impairment, renal dysfunction who is brought in by [employer] for her suicide attempt.  Patient states that his wife left him today and has a history of sheet on him.  He became very upset and cut his left wrist with a razor blade superficially.  He has done this many times before in the past.  The patient used a belt to try to hang himself.  He states that he lost consciousness and fell forward because he couldn't hold the belt anymore at which point he hit his head which woke him up.  Patient exited these pictures of himself performing these attacks to his sister who called the police.  Patient denies homicidal ideation or audiovisual hallucinations.  He is up-to-date on his tetanus vaccination   Past Medical History  Diagnosis Date  . Hypertension   . Renal disorder   . Bipolar 1 disorder   . Depression   . Hearing difficulty of left ear     75% hearing  . Hearing disorder of right ear     50% hearing   Past Surgical History  Procedure Laterality Date  . Appendectomy    . Spinal tap     History reviewed. No pertinent family history. History  Substance Use Topics  . Smoking status: Current Every Day Smoker  . Smokeless tobacco: Not on file  . Alcohol Use: No    Review of Systems  Constitutional: Negative for fever and chills.  Respiratory: Negative for cough and shortness of breath.   Cardiovascular: Negative for chest pain and palpitations.  Gastrointestinal: Negative for vomiting, abdominal pain, diarrhea and constipation.  Genitourinary:  Negative for dysuria, urgency and frequency.  Musculoskeletal: Negative for arthralgias and myalgias.  Skin: Positive for wound. Negative for rash.  Neurological: Negative for headaches.  Psychiatric/Behavioral: Positive for self-injury. Negative for hallucinations.  All other systems reviewed and are negative.   Ten systems reviewed and are negative for acute change, except as noted in the HPI.    Allergies  Other  Home Medications   Prior to Admission medications   Not on File   BP 157/81  Pulse 96  Temp(Src) 98 F (36.7 C) (Oral)  Resp 20  SpO2 97% Physical Exam Physical Exam  Nursing note and vitals reviewed. Constitutional: He appears well-developed and well-nourished. No distress.  patient has a speech impediment secondary to his hearing impairment.  He reads lips well. HENT:  Head: Normocephalic and atraumatic.  Eyes: Conjunctivae normal are normal. No scleral icterus.  no petechiae Neck: Normal range of motion. Neck supple.  or signs of trauma, trachea midline. Cardiovascular: Normal rate, regular rhythm and normal heart sounds.   Pulmonary/Chest: Effort normal and breath sounds normal. No respiratory distress.  Abdominal: Soft. There is no tenderness.  Musculoskeletal: He exhibits no edema.  Neurological: [name].  Skin: Skin is warm and dry. He is not diaphoretic.  multiple open superficial self-inflicted lacerations to left forearm  Psychiatric: His behavior is normal.    ED Course  Procedures (including critical care time) Labs Review Labs Reviewed  COMPREHENSIVE METABOLIC PANEL - Abnormal; Notable  for the following:    BUN 24 (*)    Creatinine, Ser 2.46 (*)    GFR calc non Af Amer 35 (*)    GFR calc Af Amer 40 (*)    All other components within normal limits  SALICYLATE LEVEL - Abnormal; Notable for the following:    Salicylate Lvl 123456 (*)    All other components within normal limits  ACETAMINOPHEN LEVEL  CBC  ETHANOL  URINE RAPID DRUG  SCREEN (HOSP PERFORMED)    Imaging Review No results found.   EKG Interpretation None      MDM   Final diagnoses:  Suicide attempt by hanging  Self-mutilation    Patient for suicide attempt and self injury. Patient became aggressive, angry and labile.  IVC paperwork was initiated as I do feel the patient needs evaluation and is a danger to himself at this time. Feel that he will need inpatient treatment.    Margarita Mail, PA-C 09/02/13 0740

## 2013-09-01 NOTE — ED Notes (Signed)
Patient denies SI, HI, AVH. Denies anxiety and depression. States that he was upset at home because his mother-in-law picked up his wife and took her to a friends. States he has no way to speak to his wife and this is upsetting. States he has been "emotional" since his mother died in 08-28-2006. States mother-in-law and her friend will not let him speak with his wife. States his wife has no way to get home or call him". Reports his wife has helped him through a lot and it is hard that she is not with him because they have financial stressors now with their home and he has 2 children. Feels he does not need individual help. States "need couples' counseling". "I'm in my right mind now. I want to go home and relax". Reports that he has been to [facility] in the past and because of his hearing problem, that he could not learn or get help being there. Patient states that he became agitated in ED because of the way the "counselor" talked to him. States that "lady said she was not a Social worker but when she left he was told she was". Reports he felt talked down too and this upset him and "got me sent back here". Patient states "I'm okay now. I'm not going to hurt myself or anybody, my cuts aren't deep, I just got upset, hurt, angry all at once".  Encouragement offered. Given Tylenol.  Patient safety maintained, Q 15 checks in place.

## 2013-09-01 NOTE — Progress Notes (Signed)
  CARE MANAGEMENT ED NOTE 09/01/2013  Patient:  [name], [name]   Account Number:  [id number]  Date Initiated:  09/01/2013  Documentation initiated by:  Jackelyn Poling  Subjective/Objective Assessment:   28 yr old medicare, medicaid covered [name] resident with     Subjective/Objective Assessment Detail:   States he previously obtained hearing aids from pediatrician, Dr [name] (does not know how to spell md last name) that he is no longer eligible to see Pt reports he has call various medicaid providers and has been informed they are not able to provide hearing aid because of his age Pt states he is disabled & "medicaid should pay" 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 [facility] outpatient pharmacy- not available DME   Action/Plan Detail:   CM spoke with Ms Laurance Flatten at [facility]- [phone] who confirms that after age 28 Medicaid nor medicare assists with adult hearing aids   Anticipated DC Date:  09/01/2013     Status Recommendation to Physician:   Result of Recommendation:    Other ED [place]  Other  PCP issues  Outpatient Services - Pt will follow up    Choice offered to / List presented to:            Status of service:  Completed, signed off  ED Comments:   ED Comments Detail:  The pt's address and contact numbers were sent via email to the following Adult charitable programs: 1) [employer] -[email or web address] 2) [email or web address] 3 )[employer] [phone]) - [place] 2014-2015 Club  President Burnett Sheng Meetings 1st, 3rd Monday 12:00 Sep 12 2013 [place] [address] 4) [employer]- [email or web address] 5) [employer] -  [employer] [place] [address] [place] [id number] [phone] [email or web address]  Need assistance with hearing aid for 28 yr old [place] male  CM updated pt after he arrived home at 1555 Pt voiced understanding and appreciation of assistance     Pt informed CM he is now having issues with his mother not letting his "wife" come home Reports [employer] at his home at time of CM call   He gives permission to be contacted at  [name], [name] [phone])  PENDING Responses and return call to pt or CM

## 2013-09-01 NOTE — ED Notes (Signed)
IVC papers faxed to magistrate  

## 2013-09-01 NOTE — ED Notes (Signed)
Case management checking on resources for patient concerning hear aids.

## 2013-09-02 NOTE — ED Provider Notes (Signed)
Medical screening examination/treatment/procedure(s) were performed by non-physician practitioner and as supervising physician I was immediately available for consultation/collaboration.   EKG Interpretation None       Kalman Drape, MD 09/02/13 1739

## 2013-09-05 ENCOUNTER — Encounter (INDEPENDENT_AMBULATORY_CARE_PROVIDER_SITE_OTHER): Payer: Self-pay

## 2013-09-05 ENCOUNTER — Other Ambulatory Visit (HOSPITAL_COMMUNITY): Payer: Medicare Other | Attending: Psychiatry | Admitting: Psychiatry

## 2013-09-05 DIAGNOSIS — F3130 Bipolar disorder, current episode depressed, mild or moderate severity, unspecified: Secondary | ICD-10-CM | POA: Insufficient documentation

## 2013-09-05 DIAGNOSIS — I10 Essential (primary) hypertension: Secondary | ICD-10-CM | POA: Insufficient documentation

## 2013-09-05 DIAGNOSIS — F39 Unspecified mood [affective] disorder: Secondary | ICD-10-CM

## 2013-09-05 NOTE — Progress Notes (Signed)
    Daily Group Progress Note  Program: IOP  Group Time: 9:00-10:30  Participation Level: Active  Behavioral Response: Appropriate  Type of Therapy:  Group Therapy  Summary of Progress: Pt. Met with psychiatrist and case manager.     Group Time: 10:30-12:00  Participation Level:  Active  Behavioral Response: Appropriate  Type of Therapy: Psycho-education Group  Summary of Progress: Pt. Met with psychiatrist and case manager.  [facility] 

## 2013-09-06 ENCOUNTER — Ambulatory Visit (HOSPITAL_COMMUNITY): Payer: Self-pay | Admitting: Psychiatry

## 2013-09-06 ENCOUNTER — Encounter (HOSPITAL_COMMUNITY): Payer: Self-pay | Admitting: Psychiatry

## 2013-09-06 NOTE — Progress Notes (Signed)
09/06/13 [place], RN, CCM, [phone]  CM received email from Lurline Hare of [facility] hearing -Psychologist, counselling Phone: [phone] and voice message from [employer] about assisting pt with hearing aid CM called pt and provided him with this information for these contact persons. Cm also spoke with Cecilia at the number John left as [phone] to leave pt contact information and to informed him pt still needed assist Left Cm number for return call as needed Pt voiced understanding and appreciation of services rendered CM noted pt had assist of a male in the background to assist with writing down the contact information for Jenny Reichmann and [employer]

## 2013-09-07 ENCOUNTER — Ambulatory Visit (HOSPITAL_COMMUNITY): Payer: Self-pay

## 2013-09-08 ENCOUNTER — Ambulatory Visit (HOSPITAL_COMMUNITY): Payer: Self-pay

## 2013-09-09 ENCOUNTER — Ambulatory Visit (HOSPITAL_COMMUNITY): Payer: Self-pay

## 2013-09-12 ENCOUNTER — Ambulatory Visit (HOSPITAL_COMMUNITY): Payer: Self-pay

## 2013-09-13 ENCOUNTER — Ambulatory Visit (HOSPITAL_COMMUNITY): Payer: Self-pay

## 2013-09-14 ENCOUNTER — Ambulatory Visit (HOSPITAL_COMMUNITY): Payer: Self-pay

## 2013-09-15 ENCOUNTER — Ambulatory Visit (HOSPITAL_COMMUNITY): Payer: Self-pay

## 2013-09-16 ENCOUNTER — Ambulatory Visit (HOSPITAL_COMMUNITY): Payer: Self-pay

## 2013-09-19 ENCOUNTER — Ambulatory Visit (HOSPITAL_COMMUNITY): Payer: Self-pay

## 2013-09-20 ENCOUNTER — Ambulatory Visit (HOSPITAL_COMMUNITY): Payer: Self-pay

## 2013-09-21 ENCOUNTER — Ambulatory Visit (HOSPITAL_COMMUNITY): Payer: Self-pay

## 2013-09-22 ENCOUNTER — Ambulatory Visit (HOSPITAL_COMMUNITY): Payer: Self-pay

## 2013-09-23 ENCOUNTER — Ambulatory Visit (HOSPITAL_COMMUNITY): Payer: Self-pay

## 2013-09-26 ENCOUNTER — Ambulatory Visit (HOSPITAL_COMMUNITY): Payer: Self-pay

## 2014-04-18 ENCOUNTER — Emergency Department (HOSPITAL_COMMUNITY)
Admission: EM | Admit: 2014-04-18 | Discharge: 2014-04-18 | Disposition: A | Discharge status: Home / Self Care | Payer: Medicare Other | Attending: Emergency Medicine | Admitting: Emergency Medicine

## 2014-04-18 ENCOUNTER — Encounter (HOSPITAL_COMMUNITY): Payer: Self-pay | Admitting: Physical Medicine and Rehabilitation

## 2014-04-18 DIAGNOSIS — R55 Syncope and collapse: Secondary | ICD-10-CM | POA: Insufficient documentation

## 2014-04-18 DIAGNOSIS — R531 Weakness: Secondary | ICD-10-CM | POA: Diagnosis not present

## 2014-04-18 DIAGNOSIS — R51 Headache: Secondary | ICD-10-CM | POA: Diagnosis not present

## 2014-04-18 DIAGNOSIS — Z8659 Personal history of other mental and behavioral disorders: Secondary | ICD-10-CM | POA: Diagnosis not present

## 2014-04-18 DIAGNOSIS — R42 Dizziness and giddiness: Secondary | ICD-10-CM | POA: Diagnosis present

## 2014-04-18 DIAGNOSIS — R519 Headache, unspecified: Secondary | ICD-10-CM

## 2014-04-18 DIAGNOSIS — Z79899 Other long term (current) drug therapy: Secondary | ICD-10-CM | POA: Diagnosis not present

## 2014-04-18 DIAGNOSIS — Z720 Tobacco use: Secondary | ICD-10-CM | POA: Diagnosis not present

## 2014-04-18 DIAGNOSIS — I10 Essential (primary) hypertension: Secondary | ICD-10-CM | POA: Diagnosis not present

## 2014-04-18 DIAGNOSIS — Z8669 Personal history of other diseases of the nervous system and sense organs: Secondary | ICD-10-CM | POA: Diagnosis not present

## 2014-04-18 LAB — COMPREHENSIVE METABOLIC PANEL
ALT: 75 U/L — ABNORMAL HIGH (ref 0–53)
ANION GAP: 16 — AB (ref 5–15)
AST: 80 U/L — ABNORMAL HIGH (ref 0–37)
Albumin: 3.8 g/dL (ref 3.5–5.2)
Alkaline Phosphatase: 77 U/L (ref 39–117)
BILIRUBIN TOTAL: 0.9 mg/dL (ref 0.3–1.2)
BUN: 29 mg/dL — AB (ref 6–23)
CHLORIDE: 103 meq/L (ref 96–112)
CO2: 20 mEq/L (ref 19–32)
CREATININE: 2.53 mg/dL — AB (ref 0.50–1.35)
Calcium: 9.4 mg/dL (ref 8.4–10.5)
GFR calc Af Amer: 39 mL/min — ABNORMAL LOW (ref >90)
GFR calc non Af Amer: 33 mL/min — ABNORMAL LOW (ref >90)
Glucose, Bld: 90 mg/dL (ref 70–99)
Potassium: 3.9 mEq/L (ref 3.7–5.3)
Sodium: 139 mEq/L (ref 137–147)
Total Protein: 7.2 g/dL (ref 6.0–8.3)

## 2014-04-18 LAB — CBC WITH DIFFERENTIAL/PLATELET
BASOS ABS: 0 10*3/uL (ref 0.0–0.1)
Basophils Relative: 0 % (ref 0–1)
EOS ABS: 0.1 10*3/uL (ref 0.0–0.7)
Eosinophils Relative: 2 % (ref 0–5)
HEMATOCRIT: 45.7 % (ref 39.0–52.0)
HEMOGLOBIN: 15.8 g/dL (ref 13.0–17.0)
Lymphocytes Relative: 35 % (ref 12–46)
Lymphs Abs: 1.2 10*3/uL (ref 0.7–4.0)
MCH: 30.6 pg (ref 26.0–34.0)
MCHC: 34.6 g/dL (ref 30.0–36.0)
MCV: 88.4 fL (ref 78.0–100.0)
MONO ABS: 0.3 10*3/uL (ref 0.1–1.0)
MONOS PCT: 10 % (ref 3–12)
Neutro Abs: 1.9 10*3/uL (ref 1.7–7.7)
Neutrophils Relative %: 53 % (ref 43–77)
Platelets: 136 10*3/uL — ABNORMAL LOW (ref 150–400)
RBC: 5.17 MIL/uL (ref 4.22–5.81)
RDW: 12.5 % (ref 11.5–15.5)
WBC: 3.5 10*3/uL — ABNORMAL LOW (ref 4.0–10.5)

## 2014-04-18 MED ORDER — MECLIZINE HCL 12.5 MG PO TABS
12.5000 mg | ORAL_TABLET | Freq: Three times a day (TID) | ORAL | Status: DC | PRN
Start: 2014-04-18 — End: 2014-10-01

## 2014-04-18 MED ORDER — SODIUM CHLORIDE 0.9 % IV BOLUS (SEPSIS)
1000.0000 mL | Freq: Once | INTRAVENOUS | Status: AC
  Administered 2014-04-18: 1000 mL via INTRAVENOUS

## 2014-04-18 NOTE — ED Provider Notes (Signed)
CSN: [id number]     Arrival date & time 04/18/14  1331 History   First MD Initiated Contact with Patient 04/18/14 1550     Chief Complaint  Patient presents with  . Dizziness  . Near Syncope  . Weakness     (Consider location/radiation/quality/duration/timing/severity/associated sxs/prior Treatment) HPI [name] is a 28 y.o. male with a history of kidney disease, hypertension comes in for evaluation of weakness. Patient states for the past 2 or 3 days, every time he gets up he begins to experience a dizziness. That feeling will soon resolve after a few seconds. He cannot clarify the dizziness, says "sometimes it spinning sometimes it's not ". He also reports associated headache that feels like a squeezing pressure on his temples. He reports taking Tylenol with no relief. Denies any injury. He reports this is started since he has began a new job with [employer] and he is getting up and down a lot throughout the day. He denies any headache at this time, it only occurs when he is at work. He was unable to go to work today due to the discomfort.  Sees Dr. Clover Mealy, nephrologist. Reports his kidneys are 50% and he does not need dialysis yet.  Past Medical History  Diagnosis Date  . Hypertension   . Renal disorder   . Bipolar 1 disorder   . Depression   . Hearing difficulty of left ear     75% hearing  . Hearing disorder of right ear     50% hearing   Past Surgical History  Procedure Laterality Date  . Appendectomy    . Spinal tap     History reviewed. No pertinent family history. History  Substance Use Topics  . Smoking status: Current Every Day Smoker -- 0.50 packs/day    Types: Cigarettes  . Smokeless tobacco: Not on file  . Alcohol Use: No    Review of Systems  Constitutional: Negative for fever.  HENT: Negative for sore throat.   Eyes: Negative for visual disturbance.  Respiratory: Negative for shortness of breath.   Cardiovascular: Negative for chest pain.   Gastrointestinal: Negative for abdominal pain.  Endocrine: Negative for polyuria.  Genitourinary: Negative for dysuria.  Skin: Negative for rash.  Neurological: Positive for weakness and headaches.      Allergies  Other  Home Medications   Prior to Admission medications   Medication Sig Start Date End Date Taking? Authorizing Provider  losartan (COZAAR) 25 MG tablet Take 25 mg by mouth at bedtime.   Yes Historical Provider, MD  PRESCRIPTION MEDICATION Inject 1 each as directed every 30 (thirty) days. Shot for bipolar disorder   Yes Historical Provider, MD  meclizine (ANTIVERT) 12.5 MG tablet Take 1 tablet (12.5 mg total) by mouth 3 (three) times daily as needed for dizziness. 04/18/14   Viona Gilmore Miley Lindon, PA-C   BP 125/66 mmHg  Pulse 87  Temp(Src) 98.9 F (37.2 C) (Oral)  Resp 19  Ht 5\' 9"  (1.753 m)  Wt 195 lb (88.451 kg)  BMI 28.78 kg/m2  SpO2 97% Physical Exam  Constitutional: He is oriented to person, place, and time. He appears well-developed and well-nourished.  HENT:  Head: Normocephalic and atraumatic.  Oropharynx clear and mildly dry mucous membranes  Eyes: Conjunctivae and EOM are normal. Pupils are equal, round, and reactive to light. Right eye exhibits no discharge. Left eye exhibits no discharge. No scleral icterus.  No nystagmus.  Neck: Normal range of motion. Neck supple. No tracheal deviation present.  Cardiovascular: Normal rate, regular rhythm and normal heart sounds.  Exam reveals no gallop and no friction rub.   No murmur heard. Pulmonary/Chest: Effort normal and breath sounds normal. No respiratory distress. He has no wheezes. He has no rales.  Abdominal: Soft. There is no tenderness.  Musculoskeletal: Normal range of motion. He exhibits no edema or tenderness.  Neurological: [name] and oriented to person, place, and time.  Cranial Nerves II-XII grossly intact. No focal neuro deficits. Gait is baseline without any ataxia.  Skin: Skin is warm and  dry. No rash noted.  Psychiatric: He has a normal mood and affect.  Nursing note and vitals reviewed.   ED Course  Procedures (including critical care time) Labs Review Labs Reviewed  CBC WITH DIFFERENTIAL - Abnormal; Notable for the following:    WBC 3.5 (*)    Platelets 136 (*)    All other components within normal limits  COMPREHENSIVE METABOLIC PANEL - Abnormal; Notable for the following:    BUN 29 (*)    Creatinine, Ser 2.53 (*)    AST 80 (*)    ALT 75 (*)    GFR calc non Af Amer 33 (*)    GFR calc Af Amer 39 (*)    Anion gap 16 (*)    All other components within normal limits    Imaging Review No results found.   EKG Interpretation   Date/Time:  Tuesday April 18 2014 13:47:28 EST Ventricular Rate:  97 PR Interval:  144 QRS Duration: 88 QT Interval:  336 QTC Calculation: 426 R Axis:   61 Text Interpretation:  Normal sinus rhythm Moderate voltage criteria for  LVH, may be normal variant Borderline ECG Confirmed by ZAVITZ  MD, JOSHUA  ([id number]) on 04/18/2014 6:08:52 PM     Meds given in ED:  Medications  sodium chloride 0.9 % bolus 1,000 mL (0 mLs Intravenous Stopped 04/18/14 1740)    Discharge Medication List as of 04/18/2014  7:01 PM    START taking these medications   Details  meclizine (ANTIVERT) 12.5 MG tablet Take 1 tablet (12.5 mg total) by mouth 3 (three) times daily as needed for dizziness., Starting 04/18/2014, Until Discontinued, Print       Filed Vitals:   04/18/14 1800 04/18/14 1830 04/18/14 1900 04/18/14 1917  BP: 140/92 132/84 110/81 125/66  Pulse: 79 77 89 87  Temp:    98.9 F (37.2 C)  TempSrc:    Oral  Resp: 16 24 22 19   Height:      Weight:      SpO2: 99% 100% 99% 97%    MDM  Vitals stable - WNL -afebrile Pt resting comfortably in ED. states he feels much better after fluid administration. HA resolved. No dizziness PE--not concerning for other acute or emergent pathology. Patient is able to ambulate independently without any  apparent ataxia.No focal neuro deficits. Labwork noncontributory  DDX likely some dehydration leading to orthostatic hypotension. Dizziness is nonspecific. Will DC with meclizine and instructions for appropriate hydration.  Discussed f/u with PCP and return precautions, pt very amenable to plan. Pt stable, in good condition and is appropriate for discharge.  Prior to patient discharge, I discussed and reviewed this case with Dr.Zavitz, who also saw and evaluated the patient.      Final diagnoses:  Dizziness  Acute nonintractable headache, unspecified headache type        Verl Dicker, PA-C 04/19/14 [employer], MD 04/24/14 1535

## 2014-04-18 NOTE — ED Notes (Signed)
Pt presents to department for evaluation of dizziness, lightheadedness, and generalized weakness. States he fell at home this morning when legs became weak. Pt is alert and oriented x4. Denies pain. Pt is deaf, but is able to read lips.

## 2014-04-18 NOTE — ED Notes (Signed)
ATTEMPTING TO CONTACT PT'S SPOUSE PER PT'S REQUEST. - [phone] - LEFT MESSAGE. CALLED [phone] AS PT REQUESTED TO OBTAIN PT'S SPOUSE'S GRANDMOTHER'S NUMBER. LADY ON PHONE ADVISED SHE WILL GIVE HER THE MESSAGE - PT AWARE.

## 2014-04-18 NOTE — ED Notes (Addendum)
PT C/O WEAKNESS, HEADACHE AND DIZZINESS SINCE BEGAN WORKING AT [employer] ON 04/10/14. STATES PCP RESTARTED PT ON BP MED X 2 DAYS AGO. STATES SYMPTOMS WORSE THIS AM SO UNABLE TO GO TO WORK. STATES DIZZINESS IS WORSE WHEN STANDING. 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

## 2014-04-18 NOTE — Discharge Instructions (Signed)
You were evaluated in the ED today for your headache and dizziness. There does not appear to be an emergent cause for your symptoms. He reported feeling better after having received fluids. Please continue to monitor hydration status. He may take meclizine as directed for any dizziness she may experience in the future. Please follow-up with primary care within the next 3-5 days for further evaluation and management of your symptoms. Return to ER for worsening symptoms

## 2014-04-18 NOTE — ED Notes (Signed)
Patient given phone to contact spouse.

## 2014-04-27 ENCOUNTER — Ambulatory Visit
Admission: RE | Admit: 2014-04-27 | Discharge: 2014-04-27 | Disposition: A | Discharge status: Home / Self Care | Payer: Medicare Other | Source: Ambulatory Visit | Attending: Family Medicine | Admitting: Family Medicine

## 2014-04-27 ENCOUNTER — Other Ambulatory Visit: Payer: Self-pay | Admitting: Family Medicine

## 2014-04-27 DIAGNOSIS — R05 Cough: Secondary | ICD-10-CM

## 2014-04-27 DIAGNOSIS — R059 Cough, unspecified: Secondary | ICD-10-CM

## 2014-05-20 ENCOUNTER — Emergency Department (HOSPITAL_COMMUNITY)
Admission: EM | Admit: 2014-05-20 | Discharge: 2014-05-20 | Disposition: A | Discharge status: Home / Self Care | Payer: Medicare Other | Attending: Emergency Medicine | Admitting: Emergency Medicine

## 2014-05-20 ENCOUNTER — Encounter (HOSPITAL_COMMUNITY): Payer: Self-pay | Admitting: Emergency Medicine

## 2014-05-20 DIAGNOSIS — Z720 Tobacco use: Secondary | ICD-10-CM | POA: Insufficient documentation

## 2014-05-20 DIAGNOSIS — I10 Essential (primary) hypertension: Secondary | ICD-10-CM | POA: Diagnosis not present

## 2014-05-20 DIAGNOSIS — K529 Noninfective gastroenteritis and colitis, unspecified: Secondary | ICD-10-CM | POA: Diagnosis not present

## 2014-05-20 DIAGNOSIS — Z8659 Personal history of other mental and behavioral disorders: Secondary | ICD-10-CM | POA: Insufficient documentation

## 2014-05-20 DIAGNOSIS — Z87448 Personal history of other diseases of urinary system: Secondary | ICD-10-CM | POA: Insufficient documentation

## 2014-05-20 DIAGNOSIS — H9192 Unspecified hearing loss, left ear: Secondary | ICD-10-CM | POA: Diagnosis not present

## 2014-05-20 DIAGNOSIS — R112 Nausea with vomiting, unspecified: Secondary | ICD-10-CM

## 2014-05-20 DIAGNOSIS — R197 Diarrhea, unspecified: Secondary | ICD-10-CM | POA: Diagnosis present

## 2014-05-20 LAB — COMPREHENSIVE METABOLIC PANEL
ALK PHOS: 89 U/L (ref 39–117)
ALT: 35 U/L (ref 0–53)
AST: 44 U/L — ABNORMAL HIGH (ref 0–37)
Albumin: 3.9 g/dL (ref 3.5–5.2)
Anion gap: 10 (ref 5–15)
BUN: 31 mg/dL — AB (ref 6–23)
CO2: 22 mmol/L (ref 19–32)
Calcium: 9.4 mg/dL (ref 8.4–10.5)
Chloride: 109 mEq/L (ref 96–112)
Creatinine, Ser: 2.78 mg/dL — ABNORMAL HIGH (ref 0.50–1.35)
GFR calc Af Amer: 35 mL/min — ABNORMAL LOW (ref >90)
GFR, EST NON AFRICAN AMERICAN: 30 mL/min — AB (ref >90)
Glucose, Bld: 84 mg/dL (ref 70–99)
Potassium: 5 mmol/L (ref 3.5–5.1)
Sodium: 141 mmol/L (ref 135–145)
TOTAL PROTEIN: 7.6 g/dL (ref 6.0–8.3)
Total Bilirubin: 1.2 mg/dL (ref 0.3–1.2)

## 2014-05-20 LAB — CBC
HEMATOCRIT: 47.5 % (ref 39.0–52.0)
HEMOGLOBIN: 16.7 g/dL (ref 13.0–17.0)
MCH: 31.8 pg (ref 26.0–34.0)
MCHC: 35.2 g/dL (ref 30.0–36.0)
MCV: 90.5 fL (ref 78.0–100.0)
PLATELETS: 141 10*3/uL — AB (ref 150–400)
RBC: 5.25 MIL/uL (ref 4.22–5.81)
RDW: 12.8 % (ref 11.5–15.5)
WBC: 5 10*3/uL (ref 4.0–10.5)

## 2014-05-20 MED ORDER — SODIUM CHLORIDE 0.9 % IV BOLUS (SEPSIS)
1000.0000 mL | Freq: Once | INTRAVENOUS | Status: AC
  Administered 2014-05-20: 1000 mL via INTRAVENOUS

## 2014-05-20 MED ORDER — ONDANSETRON HCL 4 MG/2ML IJ SOLN
4.0000 mg | Freq: Once | INTRAMUSCULAR | Status: AC
  Administered 2014-05-20: 4 mg via INTRAVENOUS
  Filled 2014-05-20: qty 2

## 2014-05-20 NOTE — Discharge Instructions (Signed)
It was our pleasure to provide your ER care today - we hope that you feel better.  Rest. Drink plenty of fluids.  Follow up with primary care doctor in 1-2 days if symptoms fail to improve/resolve.  Return to ER if worse, new symptoms, persistent vomiting, severe abdominal pain, other concern.  From today's labs tests, your kidney function test (cr 2.78) is elevated, slightly above 2014 results (2.6) - avoid any anti-inflammatory pain medication use such as ibuprofen/motrin, aleve or naprosyn.  Follow up with primary care doctor in 1 week.    Nausea and Vomiting Nausea is a sick feeling that often comes before throwing up (vomiting). Vomiting is a reflex where stomach contents come out of your mouth. Vomiting can cause severe loss of body fluids (dehydration). Children and elderly adults can become dehydrated quickly, especially if they also have diarrhea. Nausea and vomiting are symptoms of a condition or disease. It is important to find the cause of your symptoms. CAUSES   Direct irritation of the stomach lining. This irritation can result from increased acid production (gastroesophageal reflux disease), infection, food poisoning, taking certain medicines (such as nonsteroidal anti-inflammatory drugs), alcohol use, or tobacco use.  Signals from the brain.These signals could be caused by a headache, heat exposure, an inner ear disturbance, increased pressure in the brain from injury, infection, a tumor, or a concussion, pain, emotional stimulus, or metabolic problems.  An obstruction in the gastrointestinal tract (bowel obstruction).  Illnesses such as diabetes, hepatitis, gallbladder problems, appendicitis, kidney problems, cancer, sepsis, atypical symptoms of a heart attack, or eating disorders.  Medical treatments such as chemotherapy and radiation.  Receiving medicine that makes you sleep (general anesthetic) during surgery. DIAGNOSIS Your caregiver may ask for tests to be done if  the problems do not improve after a few days. Tests may also be done if symptoms are severe or if the reason for the nausea and vomiting is not clear. Tests may include:  Urine tests.  Blood tests.  Stool tests.  Cultures (to look for evidence of infection).  X-rays or other imaging studies. Test results can help your caregiver make decisions about treatment or the need for additional tests. TREATMENT You need to stay well hydrated. Drink frequently but in small amounts.You may wish to drink water, sports drinks, clear broth, or eat frozen ice pops or gelatin dessert to help stay hydrated.When you eat, eating slowly may help prevent nausea.There are also some antinausea medicines that may help prevent nausea. HOME CARE INSTRUCTIONS   Take all medicine as directed by your caregiver.  If you do not have an appetite, do not force yourself to eat. However, you must continue to drink fluids.  If you have an appetite, eat a normal diet unless your caregiver tells you differently.  Eat a variety of complex carbohydrates (rice, wheat, potatoes, bread), lean meats, yogurt, fruits, and vegetables.  Avoid high-fat foods because they are more difficult to digest.  Drink enough water and fluids to keep your urine clear or pale yellow.  If you are dehydrated, ask your caregiver for specific rehydration instructions. Signs of dehydration may include:  Severe thirst.  Dry lips and mouth.  Dizziness.  Dark urine.  Decreasing urine frequency and amount.  Confusion.  Rapid breathing or pulse. SEEK IMMEDIATE MEDICAL CARE IF:   You have blood or brown flecks (like coffee grounds) in your vomit.  You have black or bloody stools.  You have a severe headache or stiff neck.  You are confused.  You have severe abdominal pain.  You have chest pain or trouble breathing.  You do not urinate at least once every 8 hours.  You develop cold or clammy skin.  You continue to vomit for  longer than 24 to 48 hours.  You have a fever. MAKE SURE YOU:   Understand these instructions.  Will watch your condition.  Will get help right away if you are not doing well or get worse. Document Released: 04/28/2005 Document Revised: 07/21/2011 Document Reviewed: 09/25/2010 [facility] Patient Information 2015 [place], [place]. This information is not intended to replace advice given to you by your health care provider. Make sure you discuss any questions you have with your health care provider.    Diarrhea Diarrhea is frequent loose and watery bowel movements. It can cause you to feel weak and dehydrated. Dehydration can cause you to become tired and thirsty, have a dry mouth, and have decreased urination that often is dark yellow. Diarrhea is a sign of another problem, most often an infection that will not last long. In most cases, diarrhea typically lasts 2-3 days. However, it can last longer if it is a sign of something more serious. It is important to treat your diarrhea as directed by your caregiver to lessen or prevent future episodes of diarrhea. CAUSES  Some common causes include:  Gastrointestinal infections caused by viruses, bacteria, or parasites.  Food poisoning or food allergies.  Certain medicines, such as antibiotics, chemotherapy, and laxatives.  Artificial sweeteners and fructose.  Digestive disorders. HOME CARE INSTRUCTIONS  Ensure adequate fluid intake (hydration): Have 1 cup (8 oz) of fluid for each diarrhea episode. Avoid fluids that contain simple sugars or sports drinks, fruit juices, whole milk products, and sodas. Your urine should be clear or pale yellow if you are drinking enough fluids. Hydrate with an oral rehydration solution that you can purchase at pharmacies, retail stores, and online. You can prepare an oral rehydration solution at home by mixing the following ingredients together:   - tsp table salt.   tsp baking soda.   tsp salt substitute  containing potassium chloride.  1  tablespoons sugar.  1 L (34 oz) of water.  Certain foods and beverages may increase the speed at which food moves through the gastrointestinal (GI) tract. These foods and beverages should be avoided and include:  Caffeinated and alcoholic beverages.  High-fiber foods, such as raw fruits and vegetables, nuts, seeds, and whole grain breads and cereals.  Foods and beverages sweetened with sugar alcohols, such as xylitol, sorbitol, and mannitol.  Some foods may be well tolerated and may help thicken stool including:  Starchy foods, such as rice, toast, pasta, low-sugar cereal, oatmeal, grits, baked potatoes, crackers, and bagels.  Bananas.  Applesauce.  Add probiotic-rich foods to help increase healthy bacteria in the GI tract, such as yogurt and fermented milk products.  Wash your hands well after each diarrhea episode.  Only take over-the-counter or prescription medicines as directed by your caregiver.  Take a warm bath to relieve any burning or pain from frequent diarrhea episodes. SEEK IMMEDIATE MEDICAL CARE IF:   You are unable to keep fluids down.  You have persistent vomiting.  You have blood in your stool, or your stools are black and tarry.  You do not urinate in 6-8 hours, or there is only a small amount of very dark urine.  You have abdominal pain that increases or localizes.  You have weakness, dizziness, confusion, or light-headedness.  You have a severe  headache.  Your diarrhea gets worse or does not get better.  You have a fever or persistent symptoms for more than 2-3 days.  You have a fever and your symptoms suddenly get worse. MAKE SURE YOU:   Understand these instructions.  Will watch your condition.  Will get help right away if you are not doing well or get worse. Document Released: 04/18/2002 Document Revised: 09/12/2013 Document Reviewed: 01/04/2012 [facility] Patient Information 2015 [place], [place]. This  information is not intended to replace advice given to you by your health care provider. Make sure you discuss any questions you have with your health care provider.    Viral Gastroenteritis Viral gastroenteritis is also known as stomach flu. This condition affects the stomach and intestinal tract. It can cause sudden diarrhea and vomiting. The illness typically lasts 3 to 8 days. Most people develop an immune response that eventually gets rid of the virus. While this natural response develops, the virus can make you quite ill. CAUSES  Many different viruses can cause gastroenteritis, such as rotavirus or noroviruses. You can catch one of these viruses by consuming contaminated food or water. You may also catch a virus by sharing utensils or other personal items with an infected person or by touching a contaminated surface. SYMPTOMS  The most common symptoms are diarrhea and vomiting. These problems can cause a severe loss of body fluids (dehydration) and a body salt (electrolyte) imbalance. Other symptoms may include:  Fever.  Headache.  Fatigue.  Abdominal pain. DIAGNOSIS  Your caregiver can usually diagnose viral gastroenteritis based on your symptoms and a physical exam. A stool sample may also be taken to test for the presence of viruses or other infections. TREATMENT  This illness typically goes away on its own. Treatments are aimed at rehydration. The most serious cases of viral gastroenteritis involve vomiting so severely that you are not able to keep fluids down. In these cases, fluids must be given through an intravenous line (IV). HOME CARE INSTRUCTIONS   Drink enough fluids to keep your urine clear or pale yellow. Drink small amounts of fluids frequently and increase the amounts as tolerated.  Ask your caregiver for specific rehydration instructions.  Avoid:  Foods high in sugar.  Alcohol.  Carbonated drinks.  Tobacco.  Juice.  Caffeine drinks.  Extremely hot or  cold fluids.  Fatty, greasy foods.  Too much intake of anything at one time.  Dairy products until 24 to 48 hours after diarrhea stops.  You may consume probiotics. Probiotics are active cultures of beneficial bacteria. They may lessen the amount and number of diarrheal stools in adults. Probiotics can be found in yogurt with active cultures and in supplements.  Wash your hands well to avoid spreading the virus.  Only take over-the-counter or prescription medicines for pain, discomfort, or fever as directed by your caregiver. Do not give aspirin to children. Antidiarrheal medicines are not recommended.  Ask your caregiver if you should continue to take your regular prescribed and over-the-counter medicines.  Keep all follow-up appointments as directed by your caregiver. SEEK IMMEDIATE MEDICAL CARE IF:   You are unable to keep fluids down.  You do not urinate at least once every 6 to 8 hours.  You develop shortness of breath.  You notice blood in your stool or vomit. This may look like coffee grounds.  You have abdominal pain that increases or is concentrated in one small area (localized).  You have persistent vomiting or diarrhea.  You have a fever.  The patient is a child younger than 3 months, and he or she has a fever.  The patient is a child older than 3 months, and he or she has a fever and persistent symptoms.  The patient is a child older than 3 months, and he or she has a fever and symptoms suddenly get worse.  The patient is a baby, and he or she has no tears when crying. MAKE SURE YOU:   Understand these instructions.  Will watch your condition.  Will get help right away if you are not doing well or get worse. Document Released: 04/28/2005 Document Revised: 07/21/2011 Document Reviewed: 02/12/2011 [facility] Patient Information 2015 [place], [place]. This information is not intended to replace advice given to you by your health care provider. Make sure you  discuss any questions you have with your health care provider.

## 2014-05-20 NOTE — ED Notes (Signed)
Patient given ginger ale. 

## 2014-05-20 NOTE — ED Notes (Signed)
Pt c/o abd pain x3 days and diarrhea since this morning. Reported 3 days ago he was unable to have a bowl movement. Abd pain 5/10 pain.  Also c/o recurrent ear infections x2 weeks.

## 2014-05-20 NOTE — ED Notes (Signed)
Pt in bathroom

## 2014-05-20 NOTE — ED Provider Notes (Signed)
CSN: [id number]     Arrival date & time 05/20/14  1105 History   First MD Initiated Contact with Patient 05/20/14 1150     Chief Complaint  Patient presents with  . Abdominal Pain  . Diarrhea     (Consider location/radiation/quality/duration/timing/severity/associated sxs/prior Treatment) Patient is a 29 y.o. male presenting with abdominal pain and diarrhea. The history is provided by the patient and a relative.  Abdominal Pain Associated symptoms: diarrhea and vomiting   Associated symptoms: no chest pain, no cough, no dysuria, no fever, no shortness of breath and no sore throat   Diarrhea Associated symptoms: abdominal pain and vomiting   Associated symptoms: no fever and no headaches   pt c/o onset nvd early this morning. 3-4 episodes of each. Diarrhea watery. Not bloody. Vomiting clear, not bloody or bilious. Intermittent, crampy, diffuse abd pain. No constant or focal abd pain. No back or flank pain. No dysuria or hematuria. No known ill contacts, travel, bad food ingestion, or ill contacts. No fever or chills. No cough or uri c/o. Pt had ear pain last week, and wants them checked.  No current hear pain, hearing loss, drainage, or headaches.      Past Medical History  Diagnosis Date  . Hypertension   . Renal disorder   . Bipolar 1 disorder   . Depression   . Hearing difficulty of left ear     75% hearing  . Hearing disorder of right ear     50% hearing   Past Surgical History  Procedure Laterality Date  . Appendectomy    . Spinal tap     History reviewed. No pertinent family history. History  Substance Use Topics  . Smoking status: Current Every Day Smoker -- 0.50 packs/day    Types: Cigarettes  . Smokeless tobacco: Not on file  . Alcohol Use: No    Review of Systems  Constitutional: Negative for fever.  HENT: Negative for sore throat.   Eyes: Negative for redness.  Respiratory: Negative for cough and shortness of breath.   Cardiovascular: Negative for chest  pain.  Gastrointestinal: Positive for vomiting, abdominal pain and diarrhea.  Endocrine: Negative for polyuria.  Genitourinary: Negative for dysuria and flank pain.  Musculoskeletal: Negative for back pain and neck pain.  Skin: Negative for rash.  Neurological: Negative for headaches.  Hematological: Does not bruise/bleed easily.  Psychiatric/Behavioral: Negative for confusion.      Allergies  Other and Nsaids  Home Medications   Prior to Admission medications   Medication Sig Start Date End Date Taking? Authorizing Provider  losartan (COZAAR) 25 MG tablet Take 25 mg by mouth at bedtime.   Yes Historical Provider, MD  PRESCRIPTION MEDICATION Inject 1 each as directed every 30 (thirty) days. Shot for bipolar disorder   Yes Historical Provider, MD  meclizine (ANTIVERT) 12.5 MG tablet Take 1 tablet (12.5 mg total) by mouth 3 (three) times daily as needed for dizziness. Patient not taking: Reported on 05/20/2014 04/18/14   Viona Gilmore Cartner, PA-C   BP 131/69 mmHg  Pulse 79  Temp(Src) 97.9 F (36.6 C) (Oral)  Resp 20  SpO2 98% Physical Exam  Constitutional: He is oriented to person, place, and time. He appears well-developed and well-nourished. No distress.  HENT:  Head: Atraumatic.  Nose: Nose normal.  Mouth/Throat: Oropharynx is clear and moist.  Eyes: Pupils are equal, round, and reactive to light.  tms normal, small amt clear fluid behind left tm. No acute om. No mastoid tenderness.  Neck: Neck supple. No tracheal deviation present.  No stiffness or rigidity.   Cardiovascular: Normal rate, regular rhythm, normal heart sounds and intact distal pulses.  Exam reveals no gallop and no friction rub.   No murmur heard. Pulmonary/Chest: Effort normal and breath sounds normal. No accessory muscle usage. No respiratory distress.  Abdominal: Soft. Bowel sounds are normal. He exhibits no distension and no mass. There is no tenderness. There is no rebound and no guarding.  No hernia.    Genitourinary:  No cva tenderness  Musculoskeletal: Normal range of motion. He exhibits no edema or tenderness.  Neurological: [name] and oriented to person, place, and time.  Skin: Skin is warm and dry. No rash noted. He is not diaphoretic.  Psychiatric: He has a normal mood and affect.  Nursing note and vitals reviewed.   ED Course  Procedures (including critical care time) Labs Review  Results for orders placed or performed during the hospital encounter of 05/20/14  CBC  Result Value Ref Range   WBC 5.0 4.0 - 10.5 K/uL   RBC 5.25 4.22 - 5.81 MIL/uL   Hemoglobin 16.7 13.0 - 17.0 g/dL   HCT 47.5 39.0 - 52.0 %   MCV 90.5 78.0 - 100.0 fL   MCH 31.8 26.0 - 34.0 pg   MCHC 35.2 30.0 - 36.0 g/dL   RDW 12.8 11.5 - 15.5 %   Platelets 141 (L) 150 - 400 K/uL  Comprehensive metabolic panel  Result Value Ref Range   Sodium 141 135 - 145 mmol/L   Potassium 5.0 3.5 - 5.1 mmol/L   Chloride 109 96 - 112 mEq/L   CO2 22 19 - 32 mmol/L   Glucose, Bld 84 70 - 99 mg/dL   BUN 31 (H) 6 - 23 mg/dL   Creatinine, Ser 2.78 (H) 0.50 - 1.35 mg/dL   Calcium 9.4 8.4 - 10.5 mg/dL   Total Protein 7.6 6.0 - 8.3 g/dL   Albumin 3.9 3.5 - 5.2 g/dL   AST 44 (H) 0 - 37 U/L   ALT 35 0 - 53 U/L   Alkaline Phosphatase 89 39 - 117 U/L   Total Bilirubin 1.2 0.3 - 1.2 mg/dL   GFR calc non Af Amer 30 (L) >90 mL/min   GFR calc Af Amer 35 (L) >90 mL/min   Anion gap 10 5 - 15   Dg Chest 2 View  04/27/2014   CLINICAL DATA:  Cough and congestion.  EXAM: CHEST  2 VIEW  COMPARISON:  06/29/2010.  FINDINGS: The heart size and mediastinal contours are within normal limits. Both lungs are clear. The visualized skeletal structures are unremarkable.  IMPRESSION: No active cardiopulmonary disease.   Electronically Signed   By: Theodore   On: 04/27/2014 16:14       MDM   Iv ns bolus. zofran iv. Labs.  Reviewed nursing notes and prior charts for additional history.   Cr 2.7, cr noted to be 2.6 in 2014.   Pt/fam aware of hx cri.  Currently pt is not on any medication at home, including is not taking any nsaids.   Discussed need pcp f/u.  Pt feels much improved. Drinking fluids. No nvd. abd soft nt.  Pt currently appears stable for d/c.      Mirna Mires, MD 05/20/14 [phone]

## 2014-10-01 ENCOUNTER — Encounter (HOSPITAL_COMMUNITY): Payer: Self-pay | Admitting: Emergency Medicine

## 2014-10-01 ENCOUNTER — Emergency Department (INDEPENDENT_AMBULATORY_CARE_PROVIDER_SITE_OTHER): Payer: Medicare Other

## 2014-10-01 ENCOUNTER — Emergency Department (INDEPENDENT_AMBULATORY_CARE_PROVIDER_SITE_OTHER)
Admission: EM | Admit: 2014-10-01 | Discharge: 2014-10-01 | Disposition: A | Discharge status: Home / Self Care | Payer: Medicare Other | Source: Home / Self Care | Attending: Family Medicine | Admitting: Family Medicine

## 2014-10-01 DIAGNOSIS — S60021A Contusion of right index finger without damage to nail, initial encounter: Secondary | ICD-10-CM

## 2014-10-01 MED ORDER — HYDROCODONE-ACETAMINOPHEN 5-325 MG PO TABS
ORAL_TABLET | ORAL | Status: AC
  Filled 2014-10-01: qty 1

## 2014-10-01 MED ORDER — HYDROCODONE-ACETAMINOPHEN 5-325 MG PO TABS
1.0000 | ORAL_TABLET | Freq: Once | ORAL | Status: AC
  Administered 2014-10-01: 1 via ORAL

## 2014-10-01 MED ORDER — HYDROCODONE-ACETAMINOPHEN 5-325 MG PO TABS: 1.0000 | ORAL_TABLET | ORAL | Status: DC | PRN

## 2014-10-01 NOTE — ED Provider Notes (Signed)
CSN: [id number]     Arrival date & time 10/01/14  1317 History   First MD Initiated Contact with Patient 10/01/14 1421     Chief Complaint  Patient presents with  . Finger Injury   (Consider location/radiation/quality/duration/timing/severity/associated sxs/prior Treatment) HPI Comments: 29 year old male complaining of right index finger pain after it was slammed in a door last night approximately 10:30 PM. He is complaining of pain to the distal phalanx.   Past Medical History  Diagnosis Date  . Hypertension   . Renal disorder   . Bipolar 1 disorder   . Depression   . Hearing difficulty of left ear     75% hearing  . Hearing disorder of right ear     50% hearing   Past Surgical History  Procedure Laterality Date  . Appendectomy    . Spinal tap     History reviewed. No pertinent family history. History  Substance Use Topics  . Smoking status: Current Every Day Smoker -- 0.50 packs/day    Types: Cigarettes  . Smokeless tobacco: Not on file  . Alcohol Use: No    Review of Systems  Constitutional: Negative.   Respiratory: Negative.   Cardiovascular: Negative.   Musculoskeletal:       As per history of present illness  Neurological: Negative.     Allergies  Other and Nsaids  Home Medications   Prior to Admission medications   Medication Sig Start Date End Date Taking? Authorizing Provider  HYDROcodone-acetaminophen (NORCO/VICODIN) 5-325 MG per tablet Take 1 tablet by mouth every 4 (four) hours as needed. 10/01/14   Janne Napoleon, NP  losartan (COZAAR) 25 MG tablet Take 25 mg by mouth at bedtime.    Historical Provider, MD  PRESCRIPTION MEDICATION Inject 1 each as directed every 30 (thirty) days. Shot for bipolar disorder    Historical Provider, MD   BP 138/79 mmHg  Pulse 72  Temp(Src) 97.8 F (36.6 C) (Oral)  Resp 16  SpO2 99% Physical Exam  Constitutional: He is oriented to person, place, and time. He appears well-developed and well-nourished. No distress.   Neck: Normal range of motion. Neck supple.  Pulmonary/Chest: Effort normal. No respiratory distress.  Musculoskeletal:  Distal phalanx of right index finger with swelling, ecchymosis and tenderness. He has ability to extend 180 and flex approximately 20. There is no apparent subungual hematoma but there is dried blood to the edge of the distal nail. No joint deformity appreciated.  Neurological: [name] and oriented to person, place, and time. He exhibits normal muscle tone.  Skin: Skin is warm and dry. He is not diaphoretic.  Nursing note and vitals reviewed.   ED Course  Procedures (including critical care time) Labs Review Labs Reviewed - No data to display  Imaging Review Dg Finger Index Right  10/01/2014   CLINICAL DATA:  Finger laceration, caught in door frame  EXAM: RIGHT INDEX FINGER 2+V  COMPARISON:  None.  FINDINGS: No fracture or dislocation is seen.  The joint spaces are preserved.  The visualized soft tissues are unremarkable.  No radiopaque foreign body is seen.  IMPRESSION: No fracture, dislocation, or radiopaque foreign body is seen.   Electronically Signed   By: Julian Hy M.D.   On: 10/01/2014 15:11     MDM   1. Contusion of right index finger without damage to nail, initial encounter    Wound cleaning procedure with Betadine and water. Splint finger and flexion. Apply Band-Aid. Keep elevated apply ice Norco 5 mg #  15 Follow-up with orthopedist on page one as needed. May return when necessary.    Janne Napoleon, NP 10/01/14 [phone]

## 2014-10-01 NOTE — Discharge Instructions (Signed)
Contusion Wear splint for 3-4 days Ice, elevation A contusion is a deep bruise. Contusions happen when an injury causes bleeding under the skin. Signs of bruising include pain, puffiness (swelling), and discolored skin. The contusion may turn blue, purple, or yellow. HOME CARE   Put ice on the injured area.  Put ice in a plastic bag.  Place a towel between your skin and the bag.  Leave the ice on for 15-20 minutes, 03-04 times a day.  Only take medicine as told by your doctor.  Rest the injured area.  If possible, raise (elevate) the injured area to lessen puffiness. GET HELP RIGHT AWAY IF:   You have more bruising or puffiness.  You have pain that is getting worse.  Your puffiness or pain is not helped by medicine. MAKE SURE YOU:   Understand these instructions.  Will watch your condition.  Will get help right away if you are not doing well or get worse. Document Released: 10/15/2007 Document Revised: 07/21/2011 Document Reviewed: 03/03/2011 [facility] Patient Information 2015 [place], [place]. This information is not intended to replace advice given to you by your health care provider. Make sure you discuss any questions you have with your health care provider.

## 2014-10-01 NOTE — ED Notes (Signed)
Reports injury to right index finger.  States finger was slammed in house door.  Limited ROM.   Mild bleeding from tip of finger.    Incident happened last night around 10:30/11p.m.

## 2015-10-21 ENCOUNTER — Emergency Department (HOSPITAL_COMMUNITY): Payer: Medicaid Other

## 2015-10-21 ENCOUNTER — Emergency Department (HOSPITAL_COMMUNITY)
Admission: EM | Admit: 2015-10-21 | Discharge: 2015-10-21 | Disposition: A | Discharge status: Home / Self Care | Payer: Medicaid Other | Attending: Emergency Medicine | Admitting: Emergency Medicine

## 2015-10-21 ENCOUNTER — Encounter (HOSPITAL_COMMUNITY): Payer: Self-pay | Admitting: Emergency Medicine

## 2015-10-21 DIAGNOSIS — M25531 Pain in right wrist: Secondary | ICD-10-CM | POA: Diagnosis present

## 2015-10-21 DIAGNOSIS — F17210 Nicotine dependence, cigarettes, uncomplicated: Secondary | ICD-10-CM | POA: Diagnosis not present

## 2015-10-21 DIAGNOSIS — Y929 Unspecified place or not applicable: Secondary | ICD-10-CM | POA: Insufficient documentation

## 2015-10-21 DIAGNOSIS — W1839XA Other fall on same level, initial encounter: Secondary | ICD-10-CM | POA: Insufficient documentation

## 2015-10-21 DIAGNOSIS — Y9389 Activity, other specified: Secondary | ICD-10-CM | POA: Insufficient documentation

## 2015-10-21 DIAGNOSIS — I10 Essential (primary) hypertension: Secondary | ICD-10-CM | POA: Diagnosis not present

## 2015-10-21 DIAGNOSIS — F319 Bipolar disorder, unspecified: Secondary | ICD-10-CM | POA: Insufficient documentation

## 2015-10-21 DIAGNOSIS — S6991XA Unspecified injury of right wrist, hand and finger(s), initial encounter: Secondary | ICD-10-CM | POA: Diagnosis not present

## 2015-10-21 DIAGNOSIS — Y999 Unspecified external cause status: Secondary | ICD-10-CM | POA: Insufficient documentation

## 2015-10-21 MED ORDER — HYDROCODONE-ACETAMINOPHEN 5-325 MG PO TABS: 1.0000 | ORAL_TABLET | Freq: Four times a day (QID) | ORAL | Status: DC | PRN

## 2015-10-21 NOTE — ED Notes (Signed)
Pt reports him and his cousin were "wrestling" this evening and he fell over a plastic chair and fell on his right arm trying to catch himself.  Pt complains of pain radiating from thumb down forearm.  Pt states he is hard of hearing and reads lips.

## 2015-10-21 NOTE — ED Notes (Signed)
Pt is hearing impaired.  Pt stated "I was wrestling with my cousin around 1830 and then about 2030, I fell on it."

## 2015-10-21 NOTE — Discharge Instructions (Signed)
Wrist Pain There are many things that can cause wrist pain. Some common causes include:  An injury to the wrist area, such as a sprain, strain, or fracture.  Overuse of the joint.  A condition that causes increased pressure on a nerve in the wrist (carpal tunnel syndrome).  Wear and tear of the joints that occurs with aging (osteoarthritis).  A variety of other types of arthritis. Sometimes, the cause of wrist pain is not known. The pain often goes away when you follow your health care provider's instructions for relieving pain at home. If your wrist pain continues, tests may need to be done to diagnose your condition. HOME CARE INSTRUCTIONS Pay attention to any changes in your symptoms. Take these actions to help with your pain:  Rest the wrist area for at least 48 hours or as told by your health care provider.  If directed, apply ice to the injured area:  Put ice in a plastic bag.  Place a towel between your skin and the bag.  Leave the ice on for 20 minutes, 2-3 times per day.  Keep your arm raised (elevated) above the level of your heart while you are sitting or lying down.  If a splint or elastic bandage has been applied, use it as told by your health care provider.  Remove the splint or bandage only as told by your health care provider.  Loosen the splint or bandage if your fingers become numb or have a tingling feeling, or if they turn cold or blue.  Take over-the-counter and prescription medicines only as told by your health care provider.  Keep all follow-up visits as told by your health care provider. This is important. SEEK MEDICAL CARE IF:  Your pain is not helped by treatment.  Your pain gets worse. SEEK IMMEDIATE MEDICAL CARE IF:  Your fingers become swollen.  Your fingers turn white, very red, or cold and blue.  Your fingers are numb or have a tingling feeling.  You have difficulty moving your fingers.   This information is not intended to replace  advice given to you by your health care provider. Make sure you discuss any questions you have with your health care provider.   Document Released: 02/05/2005 Document Revised: 01/17/2015 Document Reviewed: 09/13/2014 Elsevier Interactive Patient Education 2016 [employer]  

## 2015-10-21 NOTE — ED Provider Notes (Signed)
CSN: [id number]     Arrival date & time 10/21/15  2140 History   First MD Initiated Contact with Patient 10/21/15 2217     Chief Complaint  Patient presents with  . Arm Injury     (Consider location/radiation/quality/duration/timing/severity/associated sxs/prior Treatment) HPI Comments: Patient presents to the emergency department with chief complaint of right arm pain. States that he was wrestling with his cousin and injured his wrist, then states that he fell on his arm. He complains of moderate pain. Pain is mostly located at the wrist. It is worsened with palpation and movement. There are no other associated symptoms. There are no other modifying factors. He has not taken anything for symptoms.  The history is provided by the patient. No language interpreter was used.    Past Medical History  Diagnosis Date  . Hypertension   . Renal disorder   . Bipolar 1 disorder ([place])   . Depression   . Hearing difficulty of left ear     75% hearing  . Hearing disorder of right ear     50% hearing   Past Surgical History  Procedure Laterality Date  . Appendectomy    . Spinal tap     No family history on file. Social History  Substance Use Topics  . Smoking status: Current Every Day Smoker -- 0.30 packs/day    Types: Cigarettes  . Smokeless tobacco: None  . Alcohol Use: No    Review of Systems  Musculoskeletal: Positive for arthralgias.  All other systems reviewed and are negative.     Allergies  Other and Nsaids  Home Medications   Prior to Admission medications   Medication Sig Start Date End Date Taking? Authorizing Provider  HYDROcodone-acetaminophen (NORCO/VICODIN) 5-325 MG per tablet Take 1 tablet by mouth every 4 (four) hours as needed. 10/01/14   Janne Napoleon, NP  losartan (COZAAR) 25 MG tablet Take 25 mg by mouth at bedtime.    Historical Provider, MD  PRESCRIPTION MEDICATION Inject 1 each as directed every 30 (thirty) days. Shot for bipolar disorder    Historical  Provider, MD   BP 156/97 mmHg  Pulse 88  Temp(Src) 98.7 F (37.1 C) (Oral)  Resp 16  Ht 5\' 8"  (1.727 m)  Wt 92.987 kg  BMI 31.18 kg/m2  SpO2 98% Physical Exam Physical Exam  Constitutional: Pt appears well-developed and well-nourished. No distress.  HENT:  Head: Normocephalic and atraumatic.  Eyes: Conjunctivae are normal.  Neck: Normal range of motion.  Cardiovascular: Normal rate, regular rhythm and intact distal pulses.   Capillary refill < 3 sec  Pulmonary/Chest: Effort normal and breath sounds normal.  Musculoskeletal: Pt exhibits tenderness At right wrist, also over the anatomical snuffbox. Pt exhibits no edema.  ROM: 4/5, limited by pain  Neurological: Pt  is alert. Coordination normal.  Sensation 5/5  Strength 4/5 limited by pain  Skin: Skin is warm and dry. Pt is not diaphoretic.  No tenting of the skin  Psychiatric: Pt has a normal mood and affect.  Nursing note and vitals reviewed.  ED Course  Procedures (including critical care time) Labs Review Labs Reviewed - No data to display  Imaging Review Dg Forearm Right  10/21/2015  CLINICAL DATA:  Status post fall, with right forearm numbness. Initial encounter. EXAM: RIGHT FOREARM - 2 VIEW COMPARISON:  None. FINDINGS: There is no evidence of fracture or dislocation. The radius and ulna appear grossly intact. The carpal rows appear grossly intact, and demonstrate normal alignment. The elbow joint  is unremarkable in appearance. No elbow joint effusion is seen. No definite soft tissue abnormalities are characterized on radiograph. IMPRESSION: No evidence of fracture or dislocation. Electronically Signed   By: Garald Balding M.D.   On: 10/21/2015 22:49   Dg Wrist Complete Right  10/21/2015  CLINICAL DATA:  Status post fall, with right wrist numbness. Initial encounter. EXAM: RIGHT WRIST - COMPLETE 3+ VIEW COMPARISON:  None. FINDINGS: There is no evidence of fracture or dislocation. The carpal rows are intact, and  demonstrate normal alignment. The joint spaces are preserved. No significant soft tissue abnormalities are seen. IMPRESSION: No evidence of fracture or dislocation. Electronically Signed   By: Garald Balding M.D.   On: 10/21/2015 22:50   Dg Hand Complete Right  10/21/2015  CLINICAL DATA:  Status post fall, with right forearm, wrist and hand numbness. Initial encounter. EXAM: RIGHT HAND - COMPLETE 3+ VIEW COMPARISON:  Right hand radiographs performed 05/14/2012 FINDINGS: There is no evidence of fracture or dislocation. A small subcortical cyst is noted at the distal second metatarsal. The joint spaces are preserved. The carpal rows are intact, and demonstrate normal alignment. The soft tissues are unremarkable in appearance. IMPRESSION: No evidence of fracture or dislocation. Electronically Signed   By: Garald Balding M.D.   On: 10/21/2015 22:49   I have personally reviewed and evaluated these images and lab results as part of my medical decision-making.    MDM   Final diagnoses:  Right wrist pain    Patient with right wrist injury, likely sprain, plain films are negative. He does have some tenderness over the right anatomical snuffbox. Will give the patient a thumb spica splint. Recommend hand follow-up in one week for repeat imaging. Patient is stable and ready for discharge.    Montine Circle, PA-C 10/22/15 0002  Leonard Schwartz, MD 10/24/15 [phone]

## 2015-10-21 NOTE — ED Notes (Signed)
Patient transported to X-ray 

## 2015-11-08 ENCOUNTER — Encounter (HOSPITAL_COMMUNITY): Payer: Self-pay | Admitting: Emergency Medicine

## 2015-11-08 ENCOUNTER — Emergency Department (HOSPITAL_COMMUNITY)
Admission: EM | Admit: 2015-11-08 | Discharge: 2015-11-08 | Disposition: A | Discharge status: Home / Self Care | Payer: Medicaid Other | Attending: Emergency Medicine | Admitting: Emergency Medicine

## 2015-11-08 DIAGNOSIS — Z79899 Other long term (current) drug therapy: Secondary | ICD-10-CM | POA: Insufficient documentation

## 2015-11-08 DIAGNOSIS — F17210 Nicotine dependence, cigarettes, uncomplicated: Secondary | ICD-10-CM | POA: Diagnosis not present

## 2015-11-08 DIAGNOSIS — I10 Essential (primary) hypertension: Secondary | ICD-10-CM | POA: Diagnosis not present

## 2015-11-08 DIAGNOSIS — F209 Schizophrenia, unspecified: Secondary | ICD-10-CM | POA: Insufficient documentation

## 2015-11-08 NOTE — ED Notes (Signed)
Patient having no real issues, just needs a maintenance shot for schizophrenia.

## 2015-11-08 NOTE — ED Notes (Signed)
This RN was with patient. Pt's Case Worker came in with patient and brought patient's Invega monthly shot. MD at the facility is not available to give medication. Medication was verified by [facility], Pharmacist. Medication Administration verified with pharmacist. MD Mesner gave medication in the right deltoid. NO problems noted. Pt to be watched for thirty minutes and then released.

## 2015-11-08 NOTE — ED Notes (Signed)
Patient states needs his schizophrenia shot administered.   Patient states went to his psychiatrists office yesterday and they weren't there to administer.

## 2015-11-08 NOTE — ED Notes (Signed)
Maintenance medication brought in from outside.   Dr. Leron Croak to give the injection.   Jarrett Soho, RN had talked with him previously.   Dr. Dayna Barker came to [facility] to give the medication.

## 2015-11-08 NOTE — ED Provider Notes (Signed)
CSN: [id number]     Arrival date & time 11/08/15  1128 History   First MD Initiated Contact with Patient 11/08/15 1141     Chief Complaint  Patient presents with  . needs a shot      (Consider location/radiation/quality/duration/timing/severity/associated sxs/prior Treatment) HPI Comments: 30 yo M w/ schizophrenia here with case worker because he needs his invega shot. No worsening symptoms. No other health complaints. No exacerbating/relieving factors. This is second shot, no problems on previous administration. Has medication with him.    Past Medical History  Diagnosis Date  . Hypertension   . Renal disorder   . Bipolar 1 disorder ([place])   . Depression   . Hearing difficulty of left ear     75% hearing  . Hearing disorder of right ear     50% hearing   Past Surgical History  Procedure Laterality Date  . Appendectomy    . Spinal tap     No family history on file. Social History  Substance Use Topics  . Smoking status: Current Every Day Smoker -- 0.30 packs/day    Types: Cigarettes  . Smokeless tobacco: None  . Alcohol Use: No    Review of Systems  Psychiatric/Behavioral: Negative for hallucinations, behavioral problems, confusion and agitation. The patient is not nervous/anxious.   All other systems reviewed and are negative.     Allergies  Other and Nsaids  Home Medications   Prior to Admission medications   Medication Sig Start Date End Date Taking? Authorizing Provider  HYDROcodone-acetaminophen (NORCO/VICODIN) 5-325 MG tablet Take 1-2 tablets by mouth every 6 (six) hours as needed. 10/21/15   Montine Circle, PA-C  losartan (COZAAR) 25 MG tablet Take 25 mg by mouth at bedtime.    Historical Provider, MD  PRESCRIPTION MEDICATION Inject 1 each as directed every 30 (thirty) days. Shot for bipolar disorder    Historical Provider, MD   BP 129/77 mmHg  Pulse 67  Temp(Src) 98.2 F (36.8 C) (Oral)  Resp 18  Ht 5\' 5"  (1.651 m)  Wt 205 lb (92.987 kg)  BMI  34.11 kg/m2  SpO2 100% Physical Exam  Constitutional: He is oriented to person, place, and time. He appears well-developed and well-nourished.  HENT:  Head: Normocephalic and atraumatic.  Neck: Normal range of motion.  Cardiovascular: Normal rate.   Pulmonary/Chest: Effort normal. No respiratory distress.  Abdominal: Soft. He exhibits no distension.  Musculoskeletal: Normal range of motion. He exhibits no edema or tenderness.  Neurological: [name] and oriented to person, place, and time.  Skin: Skin is warm and dry.  Nursing note and vitals reviewed.   ED Course  Procedures (including critical care time) Labs Review Labs Reviewed - No data to display  Imaging Review No results found. I have personally reviewed and evaluated these images and lab results as part of my medical decision-making.   EKG Interpretation None      MDM   Final diagnoses:  Schizophrenia, unspecified type [facility])    30 yo M without acute complaint, needs invega shot. Shot administered by myself after medication verification, to the right deltoid. Observed by nursing for 30 minutes and discharged back to care of case manager.   New Prescriptions: Discharge Medication List as of 11/08/2015 11:55 AM       I have personally and contemperaneously reviewed labs and imaging and used in my decision making as above.   A medical screening exam was performed and I feel the patient has had an appropriate  workup for their chief complaint at this time and likelihood of emergent condition existing is low and thus workup can continue on an outpatient basis.. Their vital signs are stable. They have been counseled on decision, discharge, follow up and which symptoms necessitate immediate return to the emergency department.  They verbally stated understanding and agreement with plan and discharged in stable condition.      Merrily Pew, MD 11/09/15 [phone]

## 2015-12-05 ENCOUNTER — Emergency Department (HOSPITAL_COMMUNITY): Payer: Medicaid Other

## 2015-12-05 ENCOUNTER — Encounter (HOSPITAL_COMMUNITY): Payer: Self-pay | Admitting: Emergency Medicine

## 2015-12-05 ENCOUNTER — Emergency Department (HOSPITAL_COMMUNITY)
Admission: EM | Admit: 2015-12-05 | Discharge: 2015-12-06 | Disposition: A | Discharge status: Home / Self Care | Payer: Medicaid Other | Attending: Emergency Medicine | Admitting: Emergency Medicine

## 2015-12-05 DIAGNOSIS — B349 Viral infection, unspecified: Secondary | ICD-10-CM | POA: Insufficient documentation

## 2015-12-05 DIAGNOSIS — I10 Essential (primary) hypertension: Secondary | ICD-10-CM | POA: Diagnosis not present

## 2015-12-05 DIAGNOSIS — F17210 Nicotine dependence, cigarettes, uncomplicated: Secondary | ICD-10-CM | POA: Insufficient documentation

## 2015-12-05 DIAGNOSIS — F329 Major depressive disorder, single episode, unspecified: Secondary | ICD-10-CM | POA: Diagnosis not present

## 2015-12-05 DIAGNOSIS — R509 Fever, unspecified: Secondary | ICD-10-CM | POA: Diagnosis present

## 2015-12-05 LAB — URINALYSIS, ROUTINE W REFLEX MICROSCOPIC
BILIRUBIN URINE: NEGATIVE
GLUCOSE, UA: NEGATIVE mg/dL
KETONES UR: NEGATIVE mg/dL
LEUKOCYTES UA: NEGATIVE
NITRITE: NEGATIVE
PH: 5.5 (ref 5.0–8.0)
Protein, ur: 100 mg/dL — AB
Specific Gravity, Urine: 1.011 (ref 1.005–1.030)

## 2015-12-05 LAB — BASIC METABOLIC PANEL
ANION GAP: 9 (ref 5–15)
BUN: 33 mg/dL — ABNORMAL HIGH (ref 6–20)
CALCIUM: 9.2 mg/dL (ref 8.9–10.3)
CO2: 20 mmol/L — ABNORMAL LOW (ref 22–32)
Chloride: 107 mmol/L (ref 101–111)
Creatinine, Ser: 3.1 mg/dL — ABNORMAL HIGH (ref 0.61–1.24)
GFR calc Af Amer: 30 mL/min — ABNORMAL LOW (ref >60)
GFR, EST NON AFRICAN AMERICAN: 26 mL/min — AB (ref >60)
Glucose, Bld: 82 mg/dL (ref 65–99)
Potassium: 4 mmol/L (ref 3.5–5.1)
Sodium: 136 mmol/L (ref 135–145)

## 2015-12-05 LAB — CBC
HEMATOCRIT: 44.2 % (ref 39.0–52.0)
Hemoglobin: 15 g/dL (ref 13.0–17.0)
MCH: 30.4 pg (ref 26.0–34.0)
MCHC: 33.9 g/dL (ref 30.0–36.0)
MCV: 89.5 fL (ref 78.0–100.0)
Platelets: 130 10*3/uL — ABNORMAL LOW (ref 150–400)
RBC: 4.94 MIL/uL (ref 4.22–5.81)
RDW: 12.5 % (ref 11.5–15.5)
WBC: 5.9 10*3/uL (ref 4.0–10.5)

## 2015-12-05 LAB — URINE MICROSCOPIC-ADD ON: Bacteria, UA: NONE SEEN

## 2015-12-05 LAB — I-STAT CG4 LACTIC ACID, ED: Lactic Acid, Venous: 0.52 mmol/L (ref 0.5–1.9)

## 2015-12-05 LAB — RAPID STREP SCREEN (MED CTR MEBANE ONLY): STREPTOCOCCUS, GROUP A SCREEN (DIRECT): NEGATIVE

## 2015-12-05 MED ORDER — ACETAMINOPHEN 325 MG PO TABS
650.0000 mg | ORAL_TABLET | Freq: Once | ORAL | Status: AC
  Administered 2015-12-05: 650 mg via ORAL
  Filled 2015-12-05: qty 2

## 2015-12-05 MED ORDER — MORPHINE SULFATE (PF) 4 MG/ML IV SOLN
4.0000 mg | Freq: Once | INTRAVENOUS | Status: AC
  Administered 2015-12-05: 4 mg via INTRAVENOUS
  Filled 2015-12-05: qty 1

## 2015-12-05 MED ORDER — SODIUM CHLORIDE 0.9 % IV BOLUS (SEPSIS)
1000.0000 mL | Freq: Once | INTRAVENOUS | Status: AC
  Administered 2015-12-05: 1000 mL via INTRAVENOUS

## 2015-12-05 NOTE — ED Notes (Signed)
Pt reports having generalized pain, dizziness, and chills yesterday after having one alcoholic beverage. Rates pain 7/10.

## 2015-12-05 NOTE — ED Provider Notes (Signed)
[name] DEPT Provider Note   CSN: [id number] Arrival date & time: 12/05/15  2008  First Provider Contact:  First MD Initiated Contact with Patient 12/05/15 2159     History   Chief Complaint Chief Complaint  Patient presents with  . Fever    HPI [name] is a 30 y.o. male.  HPI 30 y.o. male with a hx of HTN, Al[place] Syndrome, presents to the Emergency Department today complaining of generalized body aches, fever, sore throat since yesterday. Pt notes pain in throat. States pain is 7/10. No hx same. Pt states that he is able to tolerate PO. No neck stiffness. No CP/SOB/ABD pain. No N/V/D. Notes urine with odor, but no dysuria. No sick contacts. No other symptoms noted.  Past Medical History:  Diagnosis Date  . Bipolar 1 disorder ([place])   . Depression   . Hearing difficulty of left ear    75% hearing  . 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  . Hypertension   . Renal disorder     Patient Active Problem List   Diagnosis Date Noted  . Bipolar 1 disorder ([place]) 09/01/2013  . 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 APPENDECTOMY    . spinal tap       Home Medications    Prior to Admission medications   Medication Sig Start Date End Date Taking? Authorizing Provider  HYDROcodone-acetaminophen (NORCO/VICODIN) 5-325 MG tablet Take 1-2 tablets by mouth every 6 (six) hours as needed. Patient not taking: Reported on 12/05/2015 10/21/15   Montine Circle, PA-C    Family History History reviewed. No pertinent family history.  Social History Social History  Substance Use Topics  . Smoking status: Current Every Day Smoker    Packs/day: 0.30    Types: Cigarettes  . Smokeless tobacco: Never Used  . Alcohol use Yes     Comment: rare     Allergies   Other and Nsaids   Review of Systems Review of Systems ROS reviewed and all are negative for acute change except as noted in the HPI.  Physical  Exam Updated Vital Signs BP 150/94 (BP Location: Right Arm)   Pulse 102   Temp 101.1 F (38.4 C) (Oral)   Resp 20   Ht 5\' 8"  (1.727 m)   Wt 90.7 kg   SpO2 98%   BMI 30.41 kg/m   Physical Exam  Constitutional: He is oriented to person, place, and time. Vital signs are normal. He appears well-developed and well-nourished.  HENT:  Head: Normocephalic.  Right Ear: Hearing normal.  Left Ear: Hearing normal.  Mouth/Throat: Mucous membranes are normal. No trismus in the jaw. No dental abscesses or uvula swelling. Posterior oropharyngeal erythema present. No oropharyngeal exudate or posterior oropharyngeal edema.  Eyes: Conjunctivae and EOM are normal. Pupils are equal, round, and reactive to light.  Neck: Trachea normal and normal range of motion. Neck supple. No spinous process tenderness and no muscular tenderness present. No neck rigidity. No tracheal deviation present. No Brudzinski's sign and no Kernig's sign noted.  Full ROM of neck without pain. No nuchal rigidity   Cardiovascular: Normal rate, regular rhythm, normal heart sounds and intact distal pulses.   Pulmonary/Chest: Effort normal and breath sounds normal.  Abdominal: Soft. Normal appearance and bowel sounds are normal. There is no tenderness.  Abdomen soft  Musculoskeletal: Normal range of motion.  Neurological: [name] and oriented to person, place, and  time.  Skin: Skin is warm and dry.  Psychiatric: He has a normal mood and affect. His speech is normal and behavior is normal. Thought content normal.   ED Treatments / Results  Labs (all labs ordered are listed, but only abnormal results are displayed) Labs Reviewed  CBC - Abnormal; Notable for the following:       Result Value   Platelets 130 (*)    All other components within normal limits  BASIC METABOLIC PANEL - Abnormal; Notable for the following:    CO2 20 (*)    BUN 33 (*)    Creatinine, Ser 3.10 (*)    GFR calc non Af Amer 26 (*)    GFR calc Af Amer  30 (*)    All other components within normal limits  URINALYSIS, ROUTINE W REFLEX MICROSCOPIC (NOT AT [facility]) - Abnormal; Notable for the following:    Hgb urine dipstick SMALL (*)    Protein, ur 100 (*)    All other components within normal limits  URINE MICROSCOPIC-ADD ON - Abnormal; Notable for the following:    Squamous Epithelial / LPF 0-5 (*)    All other components within normal limits  RAPID STREP SCREEN (NOT AT [facility])  URINE CULTURE  CULTURE, GROUP A STREP ([place])  I-STAT CG4 LACTIC ACID, ED   EKG  EKG Interpretation None      Radiology Dg Chest 2 View  Result Date: 12/05/2015 CLINICAL DATA:  Shortness of Breath EXAM: CHEST  2 VIEW COMPARISON:  04/27/2014 FINDINGS: The heart size and mediastinal contours are within normal limits. Both lungs are clear. The visualized skeletal structures are unremarkable. IMPRESSION: No active cardiopulmonary disease. Electronically Signed   By: Inez Catalina M.D.   On: 12/05/2015 22:38   Procedures Procedures (including critical care time)  Medications Ordered in ED Medications  sodium chloride 0.9 % bolus 1,000 mL (not administered)  morphine 4 MG/ML injection 4 mg (not administered)   Initial Impression / Assessment and Plan / ED Course  I have reviewed the triage vital signs and the nursing notes.  Pertinent labs & imaging results that were available during my care of the patient were reviewed by me and considered in my medical decision making (see chart for details).  Clinical Course    Final Clinical Impressions(s) / ED Diagnoses  I have reviewed and evaluated the relevant laboratory values I have reviewed and evaluated the relevant imaging studies.   I have reviewed the relevant previous healthcare records. I obtained HPI from historian.  ED Course:  Assessment: Pt is a 29yM with hx Alport Syndrome who presents with generalized body aches, fatigue, sore throat since yeterday. On exam, pt in NAD. Nontoxic/nonseptic appearing.  VS. Temp 101F. HR 102bpm. Posterior oropharynx slightly erythematous without exudate. No swelling. No trismus or uvula deviation. Full ROM of neck without difficulty. Lungs CTA. Heart RRR. Abdomen nontender soft. Labs unremarkable and baseline. Creatinine elevated from baseline, but likely attributed to decrease in PO intake. Lactate negative. No leukocytosis. CXR unremarkable. Given fluids in ED with improvement of symptoms. Resolution of tachycardia. Likely viral etiology? Plan is to DC home with follow up to PCP. At time of discharge, Patient is in no acute distress. Vital Signs are stable. Patient is able to ambulate. Patient able to tolerate PO.    Disposition/Plan:  DC Home Additional Verbal discharge instructions given and discussed with patient.  Pt Instructed to f/u with PCP in the next week for evaluation and treatment of symptoms. Return  precautions given Pt acknowledges and agrees with plan  Supervising Physician Daleen Bo, MD   Final diagnoses:  Viral syndrome    New Prescriptions New Prescriptions   No medications on file     Shary Decamp, PA-C 12/06/15 0030    Daleen Bo, MD 12/07/15 1252

## 2015-12-05 NOTE — ED Triage Notes (Signed)
Pt states he is having generalized body aches, headache, dizziness, and fever  Sxs started yesterday  Pt states he has slept all day and still feels tired

## 2015-12-06 NOTE — Discharge Instructions (Signed)
Please read and follow all provided instructions.  Your diagnoses today include:  1. Viral syndrome    Tests performed today include: Vital signs. See below for your results today.   Medications prescribed:  Take as prescribed   Home care instructions:  Follow any educational materials contained in this packet.  Follow-up instructions: Please follow-up with your primary care provider for further evaluation of symptoms and treatment   Return instructions:  Please return to the Emergency Department if you do not get better, if you get worse, or new symptoms OR  - Fever (temperature greater than 101.51F)  - Bleeding that does not stop with holding pressure to the area    -Severe pain (please note that you may be more sore the day after your accident)  - Chest Pain  - Difficulty breathing  - Severe nausea or vomiting  - Inability to tolerate food and liquids  - Passing out  - Skin becoming red around your wounds  - Change in mental status (confusion or lethargy)  - New numbness or weakness    Please return if you have any other emergent concerns.  Additional Information:  Your vital signs today were: BP 106/58    Pulse 93    Temp 101.1 F (38.4 C) (Oral)    Resp 20    Ht 5\' 8"  (1.727 m)    Wt 90.7 kg    SpO2 95%    BMI 30.41 kg/m  If your blood pressure (BP) was elevated above 135/85 this visit, please have this repeated by your doctor within one month. ---------------

## 2015-12-07 LAB — URINE CULTURE: Culture: NO GROWTH

## 2015-12-09 LAB — CULTURE, GROUP A STREP (THRC)

## 2016-03-29 ENCOUNTER — Encounter (HOSPITAL_COMMUNITY): Payer: Self-pay | Admitting: Emergency Medicine

## 2016-03-29 ENCOUNTER — Emergency Department (HOSPITAL_COMMUNITY): Payer: No Typology Code available for payment source

## 2016-03-29 ENCOUNTER — Emergency Department (HOSPITAL_COMMUNITY)
Admission: EM | Admit: 2016-03-29 | Discharge: 2016-03-29 | Disposition: A | Discharge status: Home / Self Care | Payer: No Typology Code available for payment source | Attending: Emergency Medicine | Admitting: Emergency Medicine

## 2016-03-29 DIAGNOSIS — Y92410 Unspecified street and highway as the place of occurrence of the external cause: Secondary | ICD-10-CM | POA: Diagnosis not present

## 2016-03-29 DIAGNOSIS — Z87891 Personal history of nicotine dependence: Secondary | ICD-10-CM | POA: Diagnosis not present

## 2016-03-29 DIAGNOSIS — S161XXA Strain of muscle, fascia and tendon at neck level, initial encounter: Secondary | ICD-10-CM | POA: Diagnosis not present

## 2016-03-29 DIAGNOSIS — Y999 Unspecified external cause status: Secondary | ICD-10-CM | POA: Insufficient documentation

## 2016-03-29 DIAGNOSIS — S199XXA Unspecified injury of neck, initial encounter: Secondary | ICD-10-CM | POA: Diagnosis present

## 2016-03-29 DIAGNOSIS — I10 Essential (primary) hypertension: Secondary | ICD-10-CM | POA: Diagnosis not present

## 2016-03-29 DIAGNOSIS — Y939 Activity, unspecified: Secondary | ICD-10-CM | POA: Diagnosis not present

## 2016-03-29 MED ORDER — OXYCODONE-ACETAMINOPHEN 5-325 MG PO TABS
1.0000 | ORAL_TABLET | Freq: Once | ORAL | Status: AC
  Administered 2016-03-29: 1 via ORAL
  Filled 2016-03-29: qty 1

## 2016-03-29 MED ORDER — TRAMADOL HCL 50 MG PO TABS: 50.0000 mg | ORAL_TABLET | Freq: Four times a day (QID) | ORAL | 0 refills | Status: DC | PRN

## 2016-03-29 MED ORDER — LIDOCAINE 5 % EX PTCH: 1.0000 | MEDICATED_PATCH | CUTANEOUS | 0 refills | Status: DC
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
## 2016-08-10 ENCOUNTER — Encounter (HOSPITAL_COMMUNITY): Payer: Self-pay | Admitting: Emergency Medicine

## 2016-08-10 ENCOUNTER — Ambulatory Visit (HOSPITAL_COMMUNITY)
Admission: EM | Admit: 2016-08-10 | Discharge: 2016-08-10 | Disposition: A | Discharge status: Home / Self Care | Payer: Medicaid Other | Attending: Family Medicine | Admitting: Family Medicine

## 2016-08-10 DIAGNOSIS — N481 Balanitis: Secondary | ICD-10-CM

## 2016-08-10 MED ORDER — NYSTATIN-TRIAMCINOLONE 100000-0.1 UNIT/GM-% EX CREA: TOPICAL_CREAM | CUTANEOUS | 0 refills | Status: DC

## 2016-08-10 NOTE — ED Provider Notes (Signed)
CSN: [id number]     Arrival date & time 08/10/16  1830 History   First MD Initiated Contact with Patient 08/10/16 1913     Chief Complaint  Patient presents with  . Groin Swelling   (Consider location/radiation/quality/duration/timing/severity/associated sxs/prior Treatment) Patient c/o rash and bumps on penis and testes.   The history is provided by the patient.  Rash  Location: genitals. Quality: itchiness and redness   Severity:  Moderate Onset quality:  Sudden Duration:  2 months Timing:  Constant Progression:  Worsening Chronicity:  New Relieved by:  Nothing Worsened by:  Nothing Ineffective treatments:  None tried   Past Medical History:  Diagnosis Date  . Bipolar 1 disorder ([place])   . Depression   . Hearing difficulty of left ear    75% hearing  . 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  . Hypertension   . Renal disorder    Past Surgical History:  Procedure Laterality Date  . APPENDECTOMY    . spinal tap     Family History  Problem Relation Age of Onset  . Hypertension Mother   . Kidney failure Mother    Social History  Substance Use Topics  . Smoking status: Former Smoker    Packs/day: 0.30    Types: Cigarettes    Quit date: 02/27/2016  . Smokeless tobacco: Never Used  . Alcohol use No     Comment: rare    Review of Systems  Constitutional: Negative.   HENT: Negative.   Eyes: Negative.   Respiratory: Negative.   Cardiovascular: Negative.   Gastrointestinal: Negative.   Endocrine: Negative.   Genitourinary: Negative.   Musculoskeletal: Negative.   Skin: Positive for rash.  Allergic/Immunologic: Negative.   Neurological: Negative.   Hematological: Negative.   Psychiatric/Behavioral: Negative.     Allergies  Other and Nsaids  Home Medications   Prior to Admission medications   Medication Sig Start Date End Date Taking? Authorizing Provider  nystatin-triamcinolone (MYCOLOG II) cream Apply to affected area daily 08/10/16   Lysbeth Penner, FNP   Meds Ordered and Administered this Visit  Medications - No data to display  BP (!) 143/71 (BP Location: Right Arm)   Pulse 80   Temp 98.5 F (36.9 C) (Oral)   Resp 18   SpO2 100%  No data found.   Physical Exam  Constitutional: He is oriented to person, place, and time. He appears well-developed and well-nourished.  HENT:  Head: Normocephalic and atraumatic.  Eyes: Conjunctivae and EOM are normal. Pupils are equal, round, and reactive to light.  Neck: Normal range of motion. Neck supple.  Cardiovascular: Normal rate, regular rhythm and normal heart sounds.   Pulmonary/Chest: Effort normal and breath sounds normal.  Musculoskeletal: Normal range of motion.  Neurological: [name] and oriented to person, place, and time.  Skin: Rash noted.  Dry rash on bilateral scrotum and glans no urethral discharge  Nursing note and vitals reviewed.   Urgent Care Course     Procedures (including critical care time)  Labs Review Labs Reviewed - No data to display  Imaging Review No results found.   Visual Acuity Review  Right Eye Distance:   Left Eye Distance:   Bilateral Distance:    Right Eye Near:   Left Eye Near:    Bilateral Near:         MDM   1. Balanitis    Nystatin Cream apply bilateral bid #30 grams  Lysbeth Penner, FNP 08/10/16 1925

## 2016-08-10 NOTE — ED Triage Notes (Signed)
The patient presented to the [facility] with a complaint of penile itching an bumps x 2 months.

## 2017-07-14 DIAGNOSIS — Z720 Tobacco use: Secondary | ICD-10-CM | POA: Insufficient documentation

## 2017-07-14 DIAGNOSIS — E669 Obesity, unspecified: Secondary | ICD-10-CM | POA: Insufficient documentation

## 2017-07-14 DIAGNOSIS — E66811 Obesity, class 1: Secondary | ICD-10-CM | POA: Insufficient documentation

## 2017-07-14 DIAGNOSIS — N2581 Secondary hyperparathyroidism of renal origin: Secondary | ICD-10-CM | POA: Insufficient documentation

## 2017-07-14 DIAGNOSIS — I129 Hypertensive chronic kidney disease with stage 1 through stage 4 chronic kidney disease, or unspecified chronic kidney disease: Secondary | ICD-10-CM | POA: Insufficient documentation

## 2017-07-14 DIAGNOSIS — Z01818 Encounter for other preprocedural examination: Secondary | ICD-10-CM | POA: Insufficient documentation

## 2017-07-14 DIAGNOSIS — Z9049 Acquired absence of other specified parts of digestive tract: Secondary | ICD-10-CM | POA: Insufficient documentation

## 2017-07-14 DIAGNOSIS — Q8781 Alport syndrome: Secondary | ICD-10-CM | POA: Insufficient documentation

## 2017-07-14 DIAGNOSIS — K219 Gastro-esophageal reflux disease without esophagitis: Secondary | ICD-10-CM | POA: Insufficient documentation

## 2017-07-16 ENCOUNTER — Emergency Department (HOSPITAL_COMMUNITY): Payer: Medicaid Other

## 2017-07-16 ENCOUNTER — Other Ambulatory Visit: Payer: Self-pay

## 2017-07-16 ENCOUNTER — Emergency Department (HOSPITAL_COMMUNITY)
Admission: EM | Admit: 2017-07-16 | Discharge: 2017-07-17 | Disposition: A | Discharge status: Home / Self Care | Payer: Medicaid Other | Attending: Emergency Medicine | Admitting: Emergency Medicine

## 2017-07-16 ENCOUNTER — Encounter (HOSPITAL_COMMUNITY): Payer: Self-pay | Admitting: Emergency Medicine

## 2017-07-16 DIAGNOSIS — Y9389 Activity, other specified: Secondary | ICD-10-CM | POA: Diagnosis not present

## 2017-07-16 DIAGNOSIS — Y999 Unspecified external cause status: Secondary | ICD-10-CM | POA: Insufficient documentation

## 2017-07-16 DIAGNOSIS — Z79899 Other long term (current) drug therapy: Secondary | ICD-10-CM | POA: Diagnosis not present

## 2017-07-16 DIAGNOSIS — S82831A Other fracture of upper and lower end of right fibula, initial encounter for closed fracture: Secondary | ICD-10-CM | POA: Insufficient documentation

## 2017-07-16 DIAGNOSIS — Z87891 Personal history of nicotine dependence: Secondary | ICD-10-CM | POA: Diagnosis not present

## 2017-07-16 DIAGNOSIS — I10 Essential (primary) hypertension: Secondary | ICD-10-CM | POA: Diagnosis not present

## 2017-07-16 DIAGNOSIS — Y929 Unspecified place or not applicable: Secondary | ICD-10-CM | POA: Diagnosis not present

## 2017-07-16 DIAGNOSIS — M25561 Pain in right knee: Secondary | ICD-10-CM | POA: Diagnosis present

## 2017-07-16 LAB — COMPREHENSIVE METABOLIC PANEL
ALK PHOS: 109 U/L (ref 38–126)
ALT: 19 U/L (ref 17–63)
ANION GAP: 8 (ref 5–15)
AST: 29 U/L (ref 15–41)
Albumin: 3.5 g/dL (ref 3.5–5.0)
BILIRUBIN TOTAL: 0.3 mg/dL (ref 0.3–1.2)
BUN: 30 mg/dL — ABNORMAL HIGH (ref 6–20)
CALCIUM: 8.6 mg/dL — AB (ref 8.9–10.3)
CO2: 23 mmol/L (ref 22–32)
Chloride: 110 mmol/L (ref 101–111)
Creatinine, Ser: 3.59 mg/dL — ABNORMAL HIGH (ref 0.61–1.24)
GFR, EST AFRICAN AMERICAN: 25 mL/min — AB (ref >60)
GFR, EST NON AFRICAN AMERICAN: 21 mL/min — AB (ref >60)
Glucose, Bld: 124 mg/dL — ABNORMAL HIGH (ref 65–99)
Potassium: 4.6 mmol/L (ref 3.5–5.1)
Sodium: 141 mmol/L (ref 135–145)
TOTAL PROTEIN: 7.1 g/dL (ref 6.5–8.1)

## 2017-07-16 LAB — I-STAT CHEM 8, ED
BUN: 30 mg/dL — AB (ref 6–20)
CALCIUM ION: 1.1 mmol/L — AB (ref 1.15–1.40)
CHLORIDE: 107 mmol/L (ref 101–111)
CREATININE: 3.7 mg/dL — AB (ref 0.61–1.24)
GLUCOSE: 115 mg/dL — AB (ref 65–99)
HCT: 40 % (ref 39.0–52.0)
Hemoglobin: 13.6 g/dL (ref 13.0–17.0)
Potassium: 4.4 mmol/L (ref 3.5–5.1)
Sodium: 140 mmol/L (ref 135–145)
TCO2: 24 mmol/L (ref 22–32)

## 2017-07-16 LAB — URINALYSIS, ROUTINE W REFLEX MICROSCOPIC
BACTERIA UA: NONE SEEN
Bilirubin Urine: NEGATIVE
Glucose, UA: NEGATIVE mg/dL
Ketones, ur: NEGATIVE mg/dL
Leukocytes, UA: NEGATIVE
Nitrite: NEGATIVE
PROTEIN: 100 mg/dL — AB
SQUAMOUS EPITHELIAL / LPF: NONE SEEN
Specific Gravity, Urine: 1.011 (ref 1.005–1.030)
pH: 7 (ref 5.0–8.0)

## 2017-07-16 LAB — RAPID URINE DRUG SCREEN, HOSP PERFORMED
AMPHETAMINES: NOT DETECTED
BENZODIAZEPINES: NOT DETECTED
Barbiturates: NOT DETECTED
Cocaine: NOT DETECTED
OPIATES: NOT DETECTED
Tetrahydrocannabinol: POSITIVE — AB

## 2017-07-16 LAB — CBC WITH DIFFERENTIAL/PLATELET
Basophils Absolute: 0 10*3/uL (ref 0.0–0.1)
Basophils Relative: 0 %
EOS ABS: 0.3 10*3/uL (ref 0.0–0.7)
Eosinophils Relative: 4 %
HCT: 40.8 % (ref 39.0–52.0)
HEMOGLOBIN: 13.8 g/dL (ref 13.0–17.0)
LYMPHS ABS: 2.1 10*3/uL (ref 0.7–4.0)
Lymphocytes Relative: 26 %
MCH: 31.8 pg (ref 26.0–34.0)
MCHC: 33.8 g/dL (ref 30.0–36.0)
MCV: 94 fL (ref 78.0–100.0)
MONOS PCT: 4 %
Monocytes Absolute: 0.3 10*3/uL (ref 0.1–1.0)
Neutro Abs: 5.1 10*3/uL (ref 1.7–7.7)
Neutrophils Relative %: 66 %
Platelets: 217 10*3/uL (ref 150–400)
RBC: 4.34 MIL/uL (ref 4.22–5.81)
RDW: 12.7 % (ref 11.5–15.5)
WBC: 7.9 10*3/uL (ref 4.0–10.5)

## 2017-07-16 LAB — ETHANOL: Alcohol, Ethyl (B): <10 mg/dL (ref <10)

## 2017-07-16 LAB — LIPASE, BLOOD: LIPASE: 45 U/L (ref 11–51)

## 2017-07-16 MED ORDER — HYDROMORPHONE HCL 1 MG/ML IJ SOLN
1.0000 mg | Freq: Once | INTRAMUSCULAR | Status: AC
  Administered 2017-07-16: 1 mg via INTRAVENOUS
  Filled 2017-07-16: qty 1

## 2017-07-16 MED ORDER — SODIUM CHLORIDE 0.9 % IV BOLUS (SEPSIS)
1000.0000 mL | Freq: Once | INTRAVENOUS | Status: AC
  Administered 2017-07-16: 1000 mL via INTRAVENOUS

## 2017-07-16 MED ORDER — IOPAMIDOL (ISOVUE-300) INJECTION 61%
INTRAVENOUS | Status: AC
  Administered 2017-07-16: 100 mL
  Filled 2017-07-16: qty 100

## 2017-07-16 MED ORDER — SODIUM CHLORIDE 0.9 % IJ SOLN
INTRAMUSCULAR | Status: AC
  Filled 2017-07-16: qty 50

## 2017-07-16 MED ORDER — HYDROCODONE-ACETAMINOPHEN 5-325 MG PO TABS: 1.0000 | ORAL_TABLET | Freq: Four times a day (QID) | ORAL | 0 refills | Status: DC | PRN

## 2017-07-16 MED ORDER — ONDANSETRON HCL 4 MG/2ML IJ SOLN
4.0000 mg | Freq: Once | INTRAMUSCULAR | Status: AC
  Administered 2017-07-16: 4 mg via INTRAVENOUS
  Filled 2017-07-16: qty 2
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
## 2017-07-16 NOTE — Discharge Instructions (Signed)
Follow up with Dr. Maureen Ralphs next week.  Keep left leg elevated.  Use crutches to ambulate
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
## 2017-07-17 LAB — TYPE AND SCREEN
ABO/RH(D): O POS
ANTIBODY SCREEN: NEGATIVE

## 2017-07-17 LAB — ABO/RH: ABO/RH(D): O POS

## 2017-08-18 ENCOUNTER — Telehealth (HOSPITAL_COMMUNITY): Payer: Self-pay | Admitting: Nephrology

## 2017-08-20 ENCOUNTER — Other Ambulatory Visit (HOSPITAL_COMMUNITY): Payer: Self-pay | Admitting: Nephrology

## 2017-08-20 DIAGNOSIS — Z01818 Encounter for other preprocedural examination: Secondary | ICD-10-CM

## 2017-08-21 NOTE — Telephone Encounter (Signed)
User: Cherie Dark A Date/time: 08/20/17 9:37 AM  Comment: Called and lmsg with Marcie informing her that I have not been able to contact patient to schedule test.   Context:  Outcome: Left Message  Phone number: [phone] Phone Type:   Comm. type: Telephone Call type: Outgoing  Contact: Marcie Relation to patient: Provider    User: Cherie Dark A Date/time: 08/20/17 9:33 AM  Comment: Phone went straight to VM.Marland Kitchenand was unable to lmsg due to VM not being set up..RG  Context:  Outcome: No Answer/Busy  Phone number: [phone] Phone Type: Home Phone  Comm. type: Telephone Call type: Outgoing  Contact: [name], Edward L Relation to patient: Self    User: Cherie Dark A Date/time: 08/19/17 9:53 AM  Comment: Called pt and was unable to lmsg due to no VM being set up..RG  Context:  Outcome: No Answer/Busy  Phone number: [phone] Phone Type: Home Phone  Comm. type: Telephone Call type: Outgoing  Contact: [name], Edward L Relation to patient: Self    User: Cherie Dark A Date/time: 08/18/17 2:55 PM  Comment: Call went straight to VM and he did not have a VM set up..  Context:  Outcome: Left Message  Phone number: [phone] Phone Type: Home Phone  Comm. type: Telephone Call type: Outgoing  Contact: [name], Edward L Relation to patient: Self

## 2017-08-25 ENCOUNTER — Other Ambulatory Visit: Payer: Self-pay

## 2017-08-25 ENCOUNTER — Ambulatory Visit (HOSPITAL_COMMUNITY): Payer: Medicaid Other | Attending: Cardiovascular Disease

## 2017-08-25 DIAGNOSIS — I10 Essential (primary) hypertension: Secondary | ICD-10-CM | POA: Insufficient documentation

## 2017-08-25 DIAGNOSIS — Z01818 Encounter for other preprocedural examination: Secondary | ICD-10-CM | POA: Insufficient documentation

## 2017-08-25 DIAGNOSIS — N289 Disorder of kidney and ureter, unspecified: Secondary | ICD-10-CM | POA: Diagnosis not present

## 2017-12-29 ENCOUNTER — Ambulatory Visit: Payer: Medicaid Other | Admitting: Physical Therapy

## 2018-01-07 ENCOUNTER — Ambulatory Visit: Payer: Medicaid Other | Attending: Physical Therapy | Admitting: Physical Therapy

## 2018-01-07 ENCOUNTER — Telehealth: Payer: Self-pay | Admitting: Physical Therapy

## 2018-01-07 NOTE — Telephone Encounter (Signed)
Pt No show for PT eval. He was contacted by phone and Left VM to call back.  Elsie Ra, PT, DPT 01/07/18 1:58 PM

## 2018-02-22 ENCOUNTER — Other Ambulatory Visit: Payer: Self-pay

## 2018-02-22 ENCOUNTER — Emergency Department (HOSPITAL_COMMUNITY)
Admission: EM | Admit: 2018-02-22 | Discharge: 2018-02-23 | Disposition: A | Discharge status: Home / Self Care | Payer: No Typology Code available for payment source | Attending: Emergency Medicine | Admitting: Emergency Medicine

## 2018-02-22 ENCOUNTER — Emergency Department (HOSPITAL_COMMUNITY): Payer: No Typology Code available for payment source

## 2018-02-22 DIAGNOSIS — Z87891 Personal history of nicotine dependence: Secondary | ICD-10-CM | POA: Diagnosis not present

## 2018-02-22 DIAGNOSIS — Y9389 Activity, other specified: Secondary | ICD-10-CM | POA: Diagnosis not present

## 2018-02-22 DIAGNOSIS — S4992XA Unspecified injury of left shoulder and upper arm, initial encounter: Secondary | ICD-10-CM | POA: Diagnosis present

## 2018-02-22 DIAGNOSIS — I10 Essential (primary) hypertension: Secondary | ICD-10-CM | POA: Insufficient documentation

## 2018-02-22 DIAGNOSIS — S43102A Unspecified dislocation of left acromioclavicular joint, initial encounter: Secondary | ICD-10-CM | POA: Insufficient documentation

## 2018-02-22 DIAGNOSIS — Y998 Other external cause status: Secondary | ICD-10-CM | POA: Insufficient documentation

## 2018-02-22 DIAGNOSIS — Z79899 Other long term (current) drug therapy: Secondary | ICD-10-CM | POA: Insufficient documentation

## 2018-02-22 DIAGNOSIS — Y92410 Unspecified street and highway as the place of occurrence of the external cause: Secondary | ICD-10-CM | POA: Insufficient documentation

## 2018-02-22 DIAGNOSIS — F319 Bipolar disorder, unspecified: Secondary | ICD-10-CM | POA: Insufficient documentation
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
## 2018-02-22 NOTE — ED Notes (Signed)
Called pt 2x for vitals

## 2018-02-23 MED ORDER — HYDROCODONE-ACETAMINOPHEN 5-325 MG PO TABS: 1.0000 | ORAL_TABLET | Freq: Four times a day (QID) | ORAL | 0 refills | Status: DC | PRN

## 2018-02-23 MED ORDER — TRAMADOL HCL 50 MG PO TABS: 50.0000 mg | ORAL_TABLET | Freq: Once | ORAL | Status: DC
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
## 2018-02-23 NOTE — Discharge Instructions (Signed)
We recommend that you wear a shoulder immobilizer for the next 3 to 7 days for stability of your left shoulder.  Apply ice to your shoulder 3-4 times per day for 15 to 20 minutes each time to limit inflammation.  You may alternate this with heat if desired.  Follow-up with an orthopedic specialist to ensure resolution of your symptoms and proper healing of your [facility] joint separation.  If pain becomes severe, you may take Norco as prescribed.  Do not drive or drink alcohol after taking Norco as it may make you drowsy and affect your judgment.  Follow-up with your primary care doctor as needed.

## 2018-04-27 ENCOUNTER — Ambulatory Visit (HOSPITAL_COMMUNITY): Payer: Self-pay | Admitting: Psychiatry

## 2018-05-12 IMAGING — CR DG FOREARM 2V*R*
2 series · 2 of 2 positions shown · non-contrast
Comparison: None.

CLINICAL DATA: Status post fall, with right forearm numbness.
Initial encounter.

EXAM:
RIGHT FOREARM - 2 VIEW

[x forearm lat right]
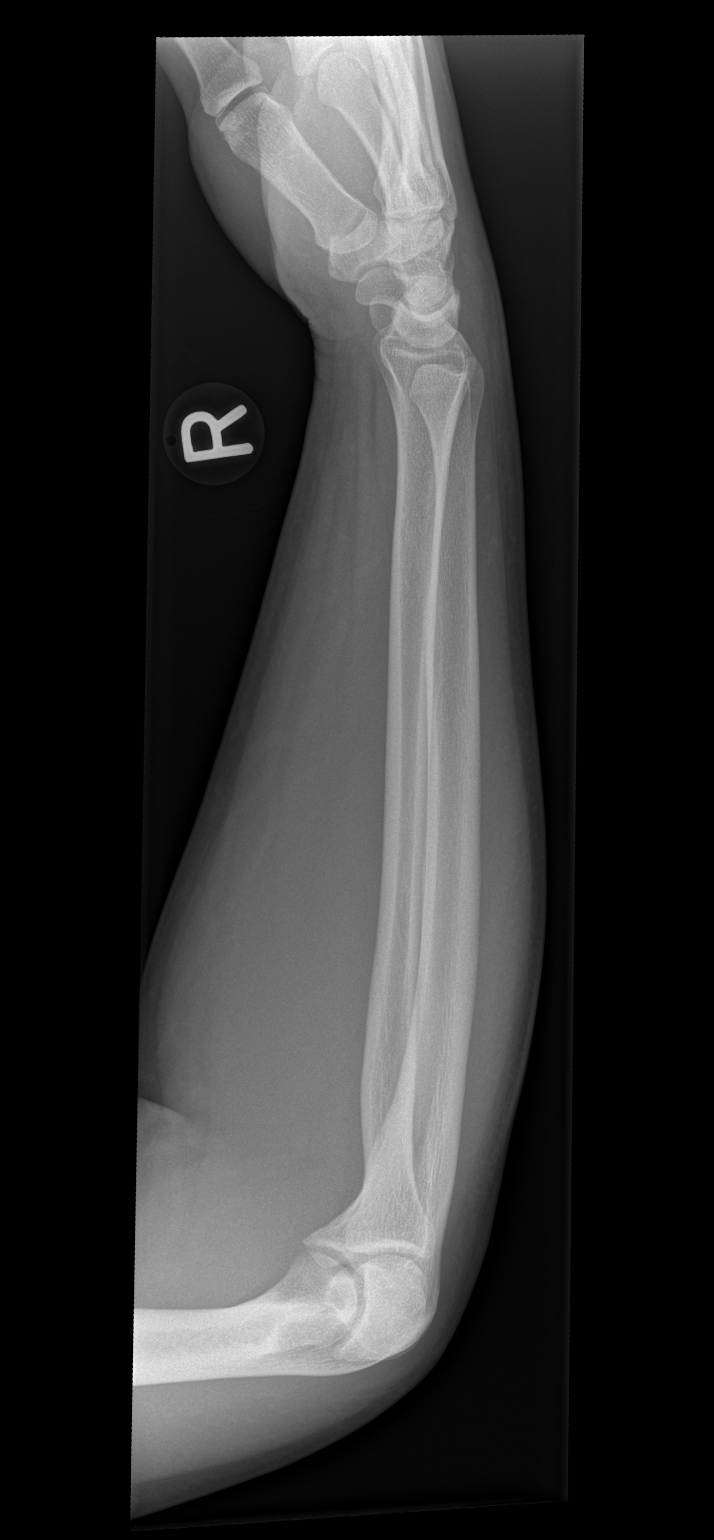

[x forearm ap right]
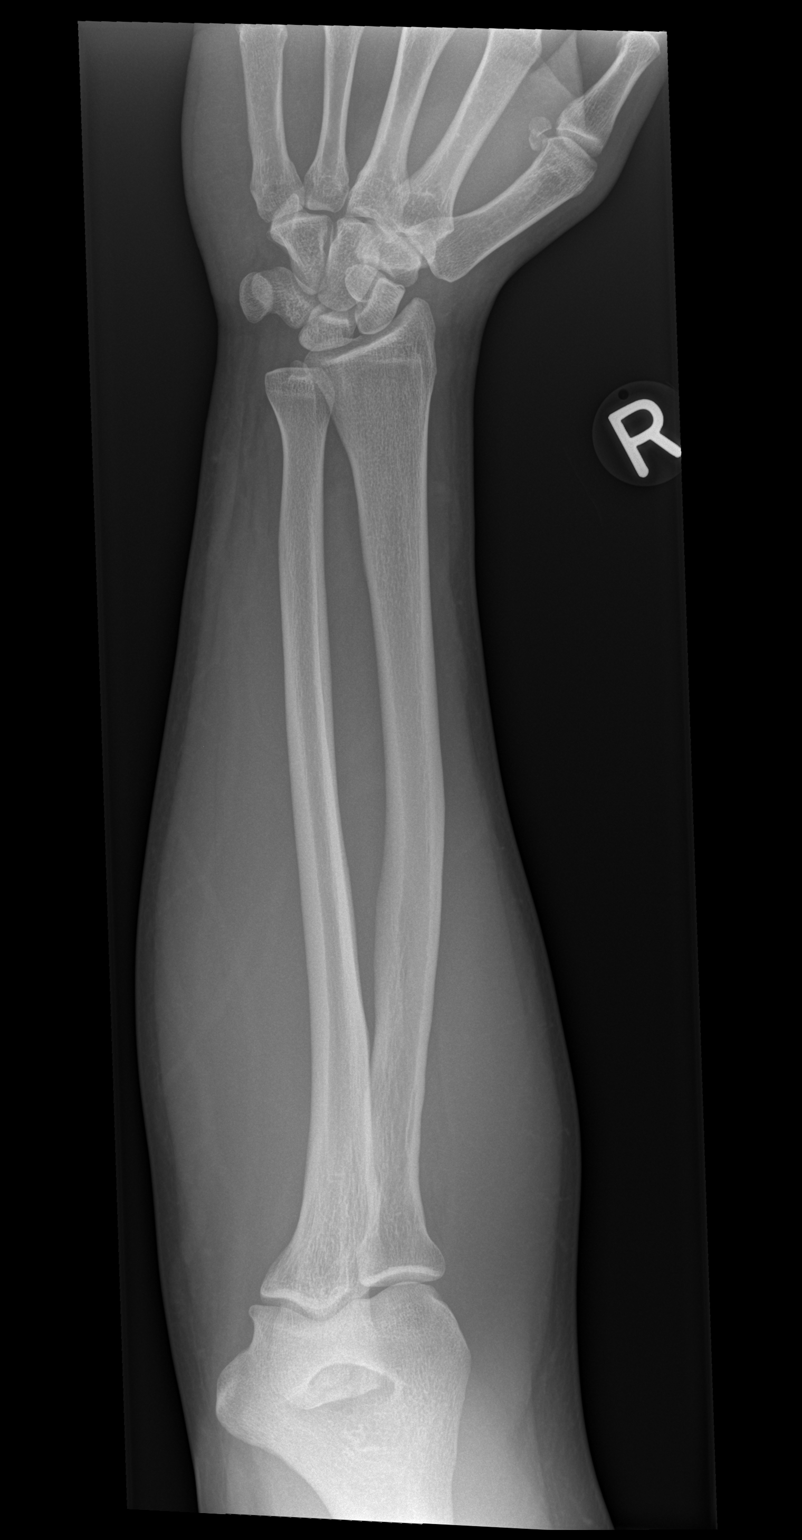

[2 of 2 positions shown; findings below may reference images not displayed]

FINDINGS: There is no evidence of fracture or dislocation. The radius and ulna
appear grossly intact. The carpal rows appear grossly intact, and
demonstrate normal alignment. The elbow joint is unremarkable in
appearance. No elbow joint effusion is seen. No definite soft tissue
abnormalities are characterized on radiograph.
IMPRESSION: No evidence of fracture or dislocation.

## 2018-05-12 IMAGING — CR DG WRIST COMPLETE 3+V*R*
4 series · 4 of 4 positions shown · non-contrast
Comparison: None.

CLINICAL DATA: Status post fall, with right wrist numbness. Initial
encounter.

EXAM:
RIGHT WRIST - COMPLETE 3+ VIEW

[x wrist pa right]
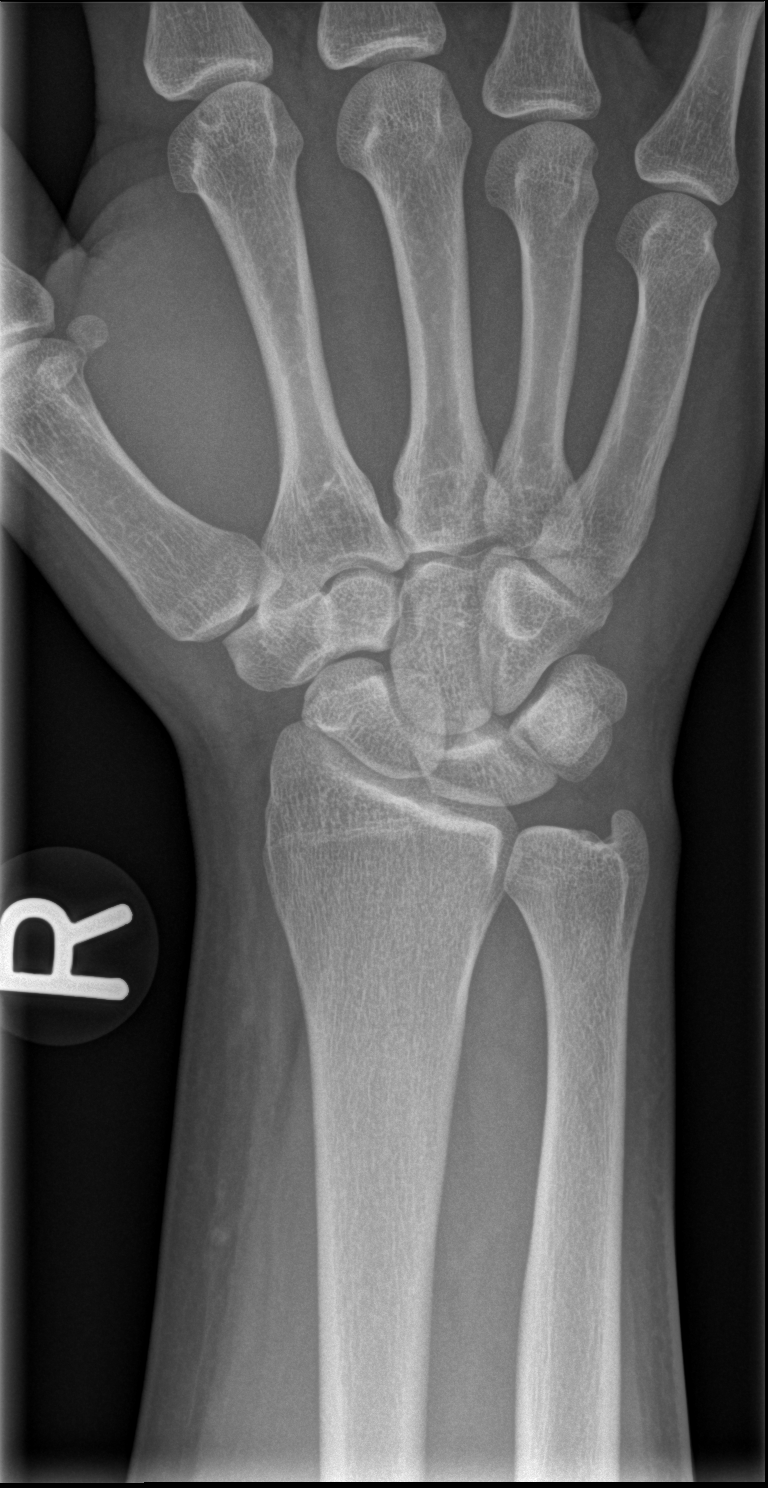

[x wrist obl right]
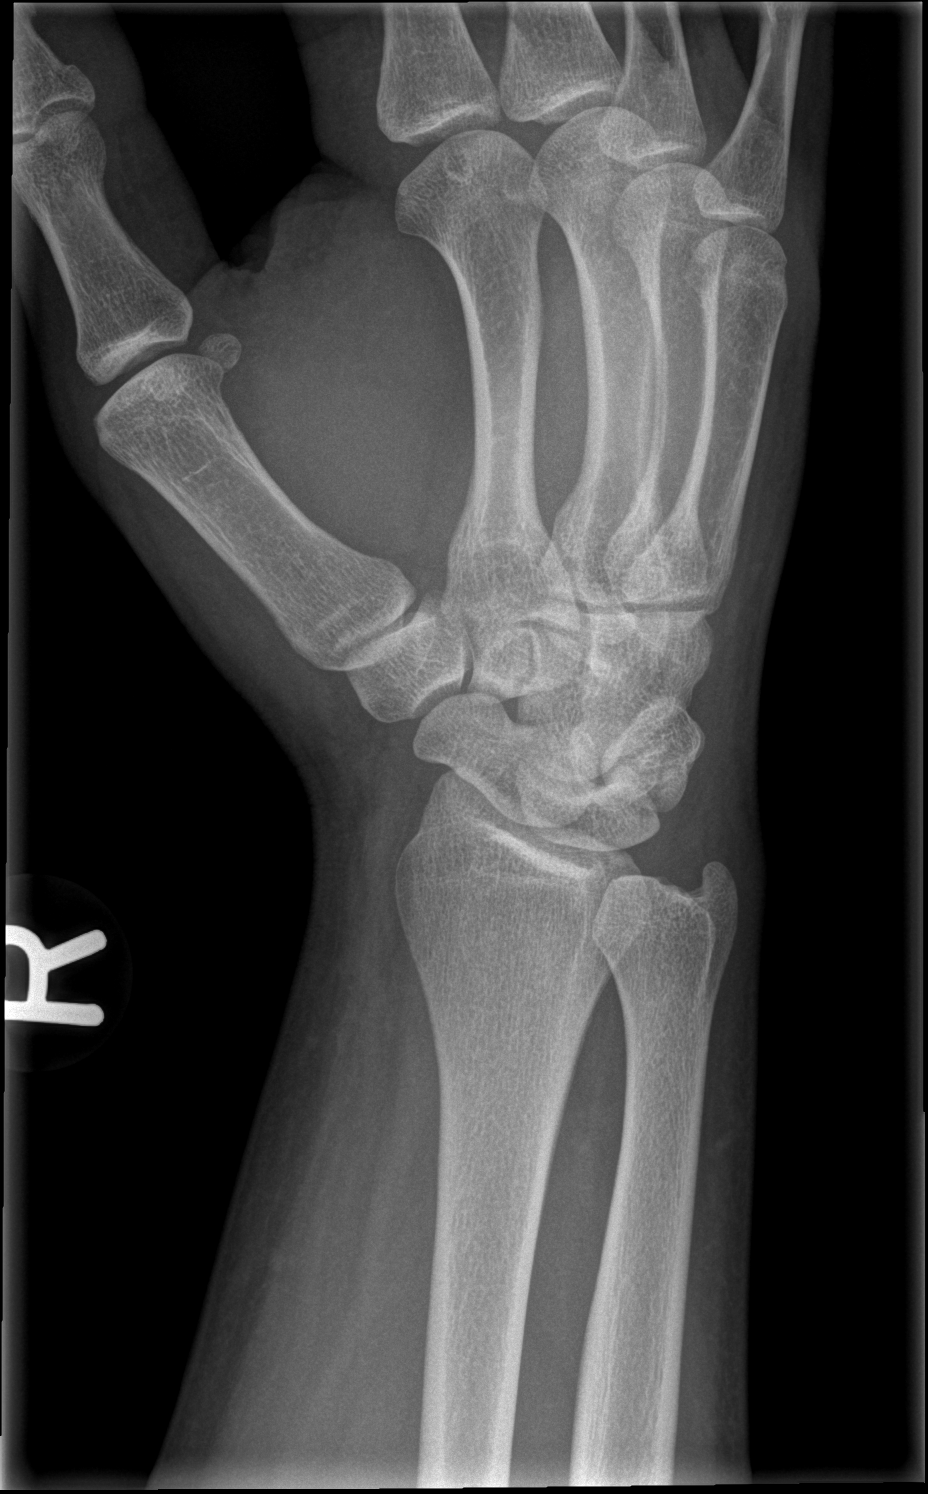

[x wrist lat right]
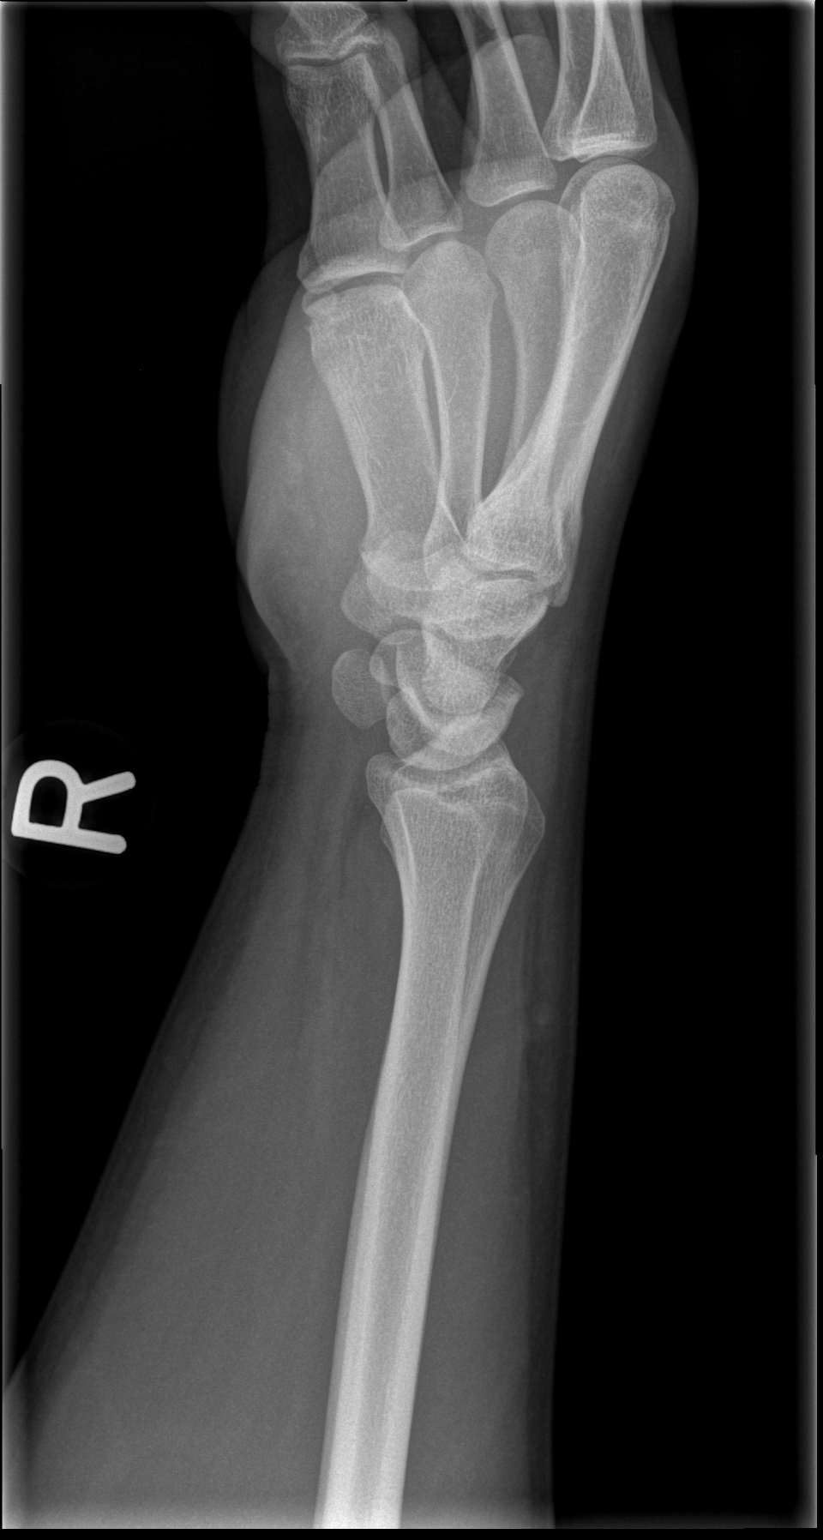

[x wrist navicular view right]
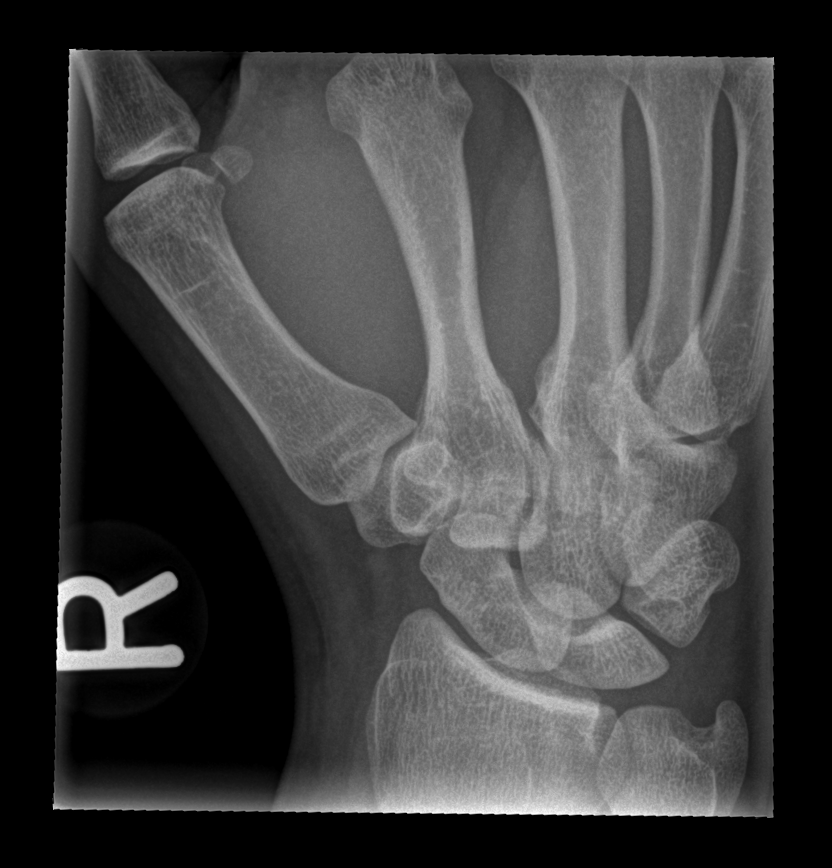

[4 of 4 positions shown; findings below may reference images not displayed]

FINDINGS: There is no evidence of fracture or dislocation. The carpal rows are
intact, and demonstrate normal alignment. The joint spaces are
preserved.

No significant soft tissue abnormalities are seen.
IMPRESSION: No evidence of fracture or dislocation.

## 2018-06-07 ENCOUNTER — Other Ambulatory Visit: Payer: Self-pay

## 2018-06-07 ENCOUNTER — Observation Stay (HOSPITAL_COMMUNITY)
Admission: EM | Admit: 2018-06-07 | Discharge: 2018-06-07 | Discharge status: Against Medical Advice | Payer: Medicaid Other | Attending: Internal Medicine | Admitting: Internal Medicine

## 2018-06-07 ENCOUNTER — Encounter (HOSPITAL_COMMUNITY): Payer: Self-pay | Admitting: Emergency Medicine

## 2018-06-07 DIAGNOSIS — Z87891 Personal history of nicotine dependence: Secondary | ICD-10-CM | POA: Diagnosis not present

## 2018-06-07 DIAGNOSIS — N186 End stage renal disease: Secondary | ICD-10-CM

## 2018-06-07 DIAGNOSIS — R55 Syncope and collapse: Principal | ICD-10-CM

## 2018-06-07 DIAGNOSIS — Z79899 Other long term (current) drug therapy: Secondary | ICD-10-CM | POA: Insufficient documentation

## 2018-06-07 DIAGNOSIS — N184 Chronic kidney disease, stage 4 (severe): Secondary | ICD-10-CM

## 2018-06-07 DIAGNOSIS — Q8781 Alport syndrome: Secondary | ICD-10-CM | POA: Insufficient documentation

## 2018-06-07 DIAGNOSIS — I129 Hypertensive chronic kidney disease with stage 1 through stage 4 chronic kidney disease, or unspecified chronic kidney disease: Secondary | ICD-10-CM | POA: Insufficient documentation

## 2018-06-07 DIAGNOSIS — R112 Nausea with vomiting, unspecified: Secondary | ICD-10-CM | POA: Diagnosis present

## 2018-06-07 DIAGNOSIS — E1122 Type 2 diabetes mellitus with diabetic chronic kidney disease: Secondary | ICD-10-CM | POA: Diagnosis not present

## 2018-06-07 DIAGNOSIS — E860 Dehydration: Secondary | ICD-10-CM | POA: Diagnosis not present

## 2018-06-07 DIAGNOSIS — I10 Essential (primary) hypertension: Secondary | ICD-10-CM

## 2018-06-07 LAB — CBC
HEMATOCRIT: 44.3 % (ref 39.0–52.0)
Hemoglobin: 14.2 g/dL (ref 13.0–17.0)
MCH: 29.8 pg (ref 26.0–34.0)
MCHC: 32.1 g/dL (ref 30.0–36.0)
MCV: 92.9 fL (ref 80.0–100.0)
NRBC: 0 % (ref 0.0–0.2)
PLATELETS: 184 10*3/uL (ref 150–400)
RBC: 4.77 MIL/uL (ref 4.22–5.81)
RDW: 12.3 % (ref 11.5–15.5)
WBC: 3.9 10*3/uL — AB (ref 4.0–10.5)

## 2018-06-07 LAB — URINALYSIS, ROUTINE W REFLEX MICROSCOPIC
Bilirubin Urine: NEGATIVE
GLUCOSE, UA: NEGATIVE mg/dL
Ketones, ur: NEGATIVE mg/dL
LEUKOCYTES UA: NEGATIVE
Nitrite: NEGATIVE
PROTEIN: 100 mg/dL — AB
Specific Gravity, Urine: 1.009 (ref 1.005–1.030)
pH: 5 (ref 5.0–8.0)

## 2018-06-07 LAB — BASIC METABOLIC PANEL
Anion gap: 12 (ref 5–15)
BUN: 48 mg/dL — AB (ref 6–20)
CO2: 17 mmol/L — AB (ref 22–32)
CREATININE: 4.95 mg/dL — AB (ref 0.61–1.24)
Calcium: 9.2 mg/dL (ref 8.9–10.3)
Chloride: 110 mmol/L (ref 98–111)
GFR calc Af Amer: 17 mL/min — ABNORMAL LOW (ref >60)
GFR calc non Af Amer: 15 mL/min — ABNORMAL LOW (ref >60)
Glucose, Bld: 82 mg/dL (ref 70–99)
Potassium: 4.4 mmol/L (ref 3.5–5.1)
Sodium: 139 mmol/L (ref 135–145)

## 2018-06-07 LAB — RAPID URINE DRUG SCREEN, HOSP PERFORMED
Amphetamines: NOT DETECTED
Barbiturates: NOT DETECTED
Benzodiazepines: NOT DETECTED
Cocaine: NOT DETECTED
OPIATES: NOT DETECTED
TETRAHYDROCANNABINOL: POSITIVE — AB

## 2018-06-07 LAB — CBG MONITORING, ED
Glucose-Capillary: 61 mg/dL — ABNORMAL LOW (ref 70–99)
Glucose-Capillary: 101 mg/dL — ABNORMAL HIGH (ref 70–99)

## 2018-06-07 MED ORDER — SODIUM CHLORIDE 0.9 % IV SOLN: INTRAVENOUS | Status: DC

## 2018-06-07 MED ORDER — ACETAMINOPHEN 650 MG RE SUPP: 650.0000 mg | Freq: Four times a day (QID) | RECTAL | Status: DC | PRN

## 2018-06-07 MED ORDER — MECLIZINE HCL 25 MG PO TABS
25.0000 mg | ORAL_TABLET | Freq: Once | ORAL | Status: AC
  Administered 2018-06-07: 25 mg via ORAL
  Filled 2018-06-07: qty 1

## 2018-06-07 MED ORDER — ALUM & MAG HYDROXIDE-SIMETH 200-200-20 MG/5ML PO SUSP
15.0000 mL | Freq: Once | ORAL | Status: AC
  Administered 2018-06-07: 15 mL via ORAL
  Filled 2018-06-07: qty 30

## 2018-06-07 MED ORDER — SODIUM CHLORIDE 0.9 % IV BOLUS
1000.0000 mL | Freq: Once | INTRAVENOUS | Status: AC
  Administered 2018-06-07: 1000 mL via INTRAVENOUS

## 2018-06-07 MED ORDER — POLYETHYLENE GLYCOL 3350 17 G PO PACK: 17.0000 g | PACK | Freq: Every day | ORAL | Status: DC | PRN

## 2018-06-07 MED ORDER — ONDANSETRON HCL 4 MG PO TABS: 4.0000 mg | ORAL_TABLET | Freq: Four times a day (QID) | ORAL | Status: DC | PRN

## 2018-06-07 MED ORDER — ACETAMINOPHEN 325 MG PO TABS: 650.0000 mg | ORAL_TABLET | Freq: Four times a day (QID) | ORAL | Status: DC | PRN

## 2018-06-07 MED ORDER — ONDANSETRON HCL 4 MG/2ML IJ SOLN: 4.0000 mg | Freq: Four times a day (QID) | INTRAMUSCULAR | Status: DC | PRN

## 2018-06-07 MED ORDER — HEPARIN SODIUM (PORCINE) 5000 UNIT/ML IJ SOLN: 5000.0000 [IU] | Freq: Three times a day (TID) | INTRAMUSCULAR | Status: DC

## 2018-06-07 MED ORDER — SODIUM CHLORIDE 0.9% FLUSH
3.0000 mL | Freq: Once | INTRAVENOUS | Status: AC
  Administered 2018-06-07: 3 mL via INTRAVENOUS

## 2018-06-07 MED ORDER — ONDANSETRON HCL 4 MG/2ML IJ SOLN
4.0000 mg | Freq: Once | INTRAMUSCULAR | Status: AC
  Administered 2018-06-07: 4 mg via INTRAVENOUS
  Filled 2018-06-07: qty 2

## 2018-06-07 NOTE — ED Provider Notes (Signed)
Care assumed from Dr. Deno Etienne, please see his note for full details, but in brief [name] is a 33 y.o. 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 This morning patient reports multiple episodes of nausea and vomiting and then woke up on the ground and was unsure what happened.  Patient reports in the few days prior he had URI sx and left ear pain which improved after taking a dose of his son's ABX and peppermint tea.  Presentation concerning for hypovolemia leading to syncopal episode.  Labs show GFR little bit worse than baseline, no other acute electrolyte derangements, pt is slightly acidotic likely in the setting of dehydration.  No other acute findings on labs, he is not anemic, has been afebrile with no signs of infection here.  He got a liter of fluids but after ambulating was still acutely symptomatic and felt as though he was going to pass out again.  Also had spontaneous episode of hypoglycemia, no history of diabetes and is not on any anti-hyperglycemics, blood sugar improved with meal.  Since patient ambulated and became symptomatic again, Dr. Tyrone Nine felt that he would require admission, at signout pending call to medicine for admission.  Case discussed with Dr. Posey Pronto with [facility] hospitalist who will see and admit the patient for further management.   Jacqlyn Larsen, PA-C 06/07/18 2100    Deno Etienne, DO 06/08/18 1507

## 2018-06-07 NOTE — ED Notes (Signed)
Pt cursing, stating "This doctor pissed me off, Im leaving" Pt proceeded to pull all his lines off, this rn removed his IV. This RN contacted admitting to make them aware that pt is leaving AMA.

## 2018-06-07 NOTE — ED Provider Notes (Signed)
[facility] EMERGENCY DEPARTMENT Provider Note   CSN: [id number] Arrival date & time: 06/07/18  1211     History   Chief Complaint Chief Complaint  Patient presents with  . Loss of Consciousness  . Emesis    HPI PHYLLIP [name] is a 33 y.o. male.  33 yo M with a chief complaint of nausea and vomiting and syncopal events.  Patient states that since this morning he has had multiple events where he tries to walk and then vomits and then collapses on the ground.  He denies chest pain or shortness of breath.  Patient has had some cough and congestion over the weekend and thinks he feels much better from that illness.  Started have the vomiting this morning.  He did take a antibiotic that his child had prescribed for the ears because he was having some left ear pain.  This is also gotten significantly better.  He thinks that he may have diabetes because he will spontaneously feel shaky and eat something and he will feel better.  States his mom had the same symptoms when she had diabetes.  The history is provided by the patient.  Loss of Consciousness  Episode history:  Multiple Most recent episode:  Today Duration: unknown. Timing:  Intermittent Progression:  Worsening Chronicity:  New Context: normal activity   Witnessed: no   Relieved by:  Nothing Worsened by:  Nothing Ineffective treatments:  None tried Associated symptoms: nausea and vomiting   Associated symptoms: no chest pain, no confusion, no fever, no headaches, no palpitations and no shortness of breath   Emesis  Associated symptoms: no abdominal pain, no arthralgias, no chills, no diarrhea, no fever, no headaches and no myalgias     Past Medical History:  Diagnosis Date  . Bipolar 1 disorder ([place])   . Depression   . Hearing difficulty of left ear    75% hearing  . 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  . Hypertension   . Renal disorder     Patient Active Problem List   Diagnosis  Date Noted  . Bipolar 1 disorder ([place]) 09/01/2013  . 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 APPENDECTOMY    . spinal tap          Home Medications    Prior to Admission medications   Medication Sig Start Date End Date Taking? Authorizing Provider  acetaminophen (TYLENOL) 500 MG tablet Take 1,000 mg by mouth every 6 (six) hours as needed for mild pain.    [provider]  amLODipine (NORVASC) 5 MG tablet Take 5 mg by mouth daily.    [provider]  HYDROcodone-acetaminophen (NORCO/VICODIN) 5-325 MG tablet Take 1 tablet by mouth every 6 (six) hours as needed for moderate pain or severe pain. 02/23/18   [name], PA-C    Family History Family History  Problem Relation Age of Onset  . Hypertension Mother   . Kidney failure Mother     Social History Social History   Tobacco Use  . Smoking status: Former Smoker    Packs/day: 0.30    Types: Cigarettes    Last attempt to quit: 02/27/2016    Years since quitting: 2.2  . Smokeless tobacco: Never Used  Substance Use Topics  . Alcohol use: No    Comment: rare  . Drug use: No     Allergies   Other and Nsaids  Review of Systems Review of Systems  Constitutional: Negative for chills and fever.  HENT: Negative for congestion and facial swelling.   Eyes: Negative for discharge and visual disturbance.  Respiratory: Negative for shortness of breath.   Cardiovascular: Positive for syncope. Negative for chest pain and palpitations.  Gastrointestinal: Positive for nausea and vomiting. Negative for abdominal pain and diarrhea.  Musculoskeletal: Negative for arthralgias and myalgias.  Skin: Negative for color change and rash.  Neurological: Positive for syncope. Negative for tremors and headaches.  Psychiatric/Behavioral: Negative for confusion and dysphoric mood.     Physical Exam Updated Vital Signs BP (!) 160/104   Pulse 85   Temp 98.9 F  (37.2 C)   Resp 16   Ht 5\' 8"  (1.727 m)   Wt 83.9 kg   SpO2 100%   BMI 28.13 kg/m   Physical Exam Vitals signs and nursing note reviewed.  Constitutional:      Appearance: He is well-developed.  HENT:     Head: Normocephalic and atraumatic.     Comments: Swollen turbinates, posterior nasal drip, no noted sinus ttp, tm normal bilaterally.   Eyes:     Pupils: Pupils are equal, round, and reactive to light.  Neck:     Musculoskeletal: Normal range of motion and neck supple.     Vascular: No JVD.  Cardiovascular:     Rate and Rhythm: Normal rate and regular rhythm.     Heart sounds: No murmur. No friction rub. No gallop.   Pulmonary:     Effort: No respiratory distress.     Breath sounds: No wheezing.  Abdominal:     General: There is no distension.     Tenderness: There is no abdominal tenderness. There is no guarding or rebound.     Hernia: No hernia is present.  Musculoskeletal: Normal range of motion.  Skin:    Coloration: Skin is not pale.     Findings: No rash.  Neurological:     Mental Status: [name] and oriented to person, place, and time.  Psychiatric:        Behavior: Behavior normal.      ED Treatments / Results  Labs (all labs ordered are listed, but only abnormal results are displayed) Labs Reviewed  BASIC METABOLIC PANEL - Abnormal; Notable for the following components:      Result Value   CO2 17 (*)    BUN 48 (*)    Creatinine, Ser 4.95 (*)    GFR calc non Af Amer 15 (*)    GFR calc Af Amer 17 (*)    All other components within normal limits  CBC - Abnormal; Notable for the following components:   WBC 3.9 (*)    All other components within normal limits  URINALYSIS, ROUTINE W REFLEX MICROSCOPIC - Abnormal; Notable for the following components:   Color, Urine STRAW (*)    Hgb urine dipstick SMALL (*)    Protein, ur 100 (*)    Bacteria, UA RARE (*)    All other components within normal limits  CBG MONITORING, ED - Abnormal; Notable for  the following components:   Glucose-Capillary 61 (*)    All other components within normal limits  CBG MONITORING, ED - Abnormal; Notable for the following components:   Glucose-Capillary 101 (*)    All other components within normal limits  LACTIC ACID, PLASMA  LACTIC ACID, PLASMA    EKG EKG Interpretation  Date/Time:  Monday June 07 2018 13:20:07 EST Ventricular  Rate:  71 PR Interval:  138 QRS Duration: 90 QT Interval:  376 QTC Calculation: 408 R Axis:   62 Text Interpretation:  Normal sinus rhythm Left ventricular hypertrophy Abnormal ECG no wpw prolonged qt or brugada No significant change since last tracing Confirmed by Deno Etienne [phone]) on 06/07/2018 4:49:14 PM   Radiology No results found.  Procedures Procedures (including critical care time)  Medications Ordered in ED Medications  sodium chloride flush (NS) 0.9 % injection 3 mL (3 mLs Intravenous Given 06/07/18 1754)  ondansetron (ZOFRAN) injection 4 mg (4 mg Intravenous Given 06/07/18 1749)  alum & mag hydroxide-simeth (MAALOX/MYLANTA) 200-200-20 MG/5ML suspension 15 mL (15 mLs Oral Given 06/07/18 1750)  meclizine (ANTIVERT) tablet 25 mg (25 mg Oral Given 06/07/18 1750)  sodium chloride 0.9 % bolus 1,000 mL (1,000 mLs Intravenous New Bag/Given 06/07/18 1749)     Initial Impression / Assessment and Plan / ED Course  I have reviewed the triage vital signs and the nursing notes.  Pertinent labs & imaging results that were available during my care of the patient were reviewed by me and considered in my medical decision making (see chart for details).     33 yo M with a significant past medical history of Alport syndrome comes in with a chief complaint of recurrent syncopal events.  Patient states that he has had this multiple times today every time he tries to get up and walk he gets acutely nauseated and vomits and then wakes up on the ground.  He has been able to eat here without difficulty.  Has no abdominal  tenderness.  He does have worsening of his GFR from 25-17.  We will give a bolus of IV fluids.  He also was hypoglycemic spontaneously here.  I am unsure the cause of this.  Has no history of diabetes is not on diabetic medications.  As the patient has had most of his symptoms and is not trying to walk I wonder if this is hypovolemia from his recent illness.  Will attempt to ambulate post fluids and nausea medicine.  Patient still with difficulty with ambulation, feeling sick to his stomach.  Will discuss with hospitalist for admission.   The patients results and plan were reviewed and discussed.   Any x-rays performed were independently reviewed by myself.   Differential diagnosis were considered with the presenting HPI.  Medications  sodium chloride flush (NS) 0.9 % injection 3 mL (3 mLs Intravenous Given 06/07/18 1754)  ondansetron (ZOFRAN) injection 4 mg (4 mg Intravenous Given 06/07/18 1749)  alum & mag hydroxide-simeth (MAALOX/MYLANTA) 200-200-20 MG/5ML suspension 15 mL (15 mLs Oral Given 06/07/18 1750)  meclizine (ANTIVERT) tablet 25 mg (25 mg Oral Given 06/07/18 1750)  sodium chloride 0.9 % bolus 1,000 mL (1,000 mLs Intravenous New Bag/Given 06/07/18 1749)    Vitals:   06/07/18 1319 06/07/18 1320 06/07/18 1546 06/07/18 1804  BP: (!) 148/109  (!) 140/109 (!) 160/104  Pulse: 80  72 85  Resp: 16  18 16   Temp:   98.9 F (37.2 C) 98.9 F (37.2 C)  TempSrc:   Oral   SpO2: 100%  100% 100%  Weight:  83.9 kg    Height:  5\' 8"  (1.727 m)      Final diagnoses:  Syncope and collapse    Admission/ observation were discussed with the admitting physician, patient and/or family and they are comfortable with the plan.    Final Clinical Impressions(s) / ED Diagnoses   Final diagnoses:  Syncope  and collapse    ED Discharge Orders    None       Deno Etienne, [place] 06/07/18 1919

## 2018-06-07 NOTE — ED Triage Notes (Addendum)
Onset today woke up general abdomen achy with emesis and dizziness. States he "passed out" and "dropped out" on couch as well and feeling general weakness. States 2 days ago he thought he had the flu with body achy and symptoms resolved yesterday. Alert answering and following commands appropriate. Patient has a history hard of hearing.

## 2018-06-07 NOTE — H&P (Addendum)
History and Physical    NACHMAN SUNDT KDX:[id number] DOB: Feb 12, 1986 DOA: 06/07/2018  PCP: [facility]  Patient coming from: Home  I have personally briefly reviewed patient's old medical records in [place]   Chief Complaint: Nausea vomiting  HPI: [name] is a 33 y.o. male with medical history significant for Alport syndrome with CKD Stage IV, hypertension, hearing loss, and marijuana use who presents the ED with 1 day of sudden onset nausea, vomiting, lightheadedness, and fatigue.  Patient reports recent URI symptoms which resolved 2 days prior to admission.  He did well for 1 day until symptoms began morning of admission 06/07/18.  He reports frequent clear emesis and is unable to maintain oral intake.  He denies any chest pain, dyspnea, abdominal pain, diarrhea, or dysuria.  Admits to marijuana use and says he smokes "a lot."  He says his symptoms did feel a bit better when taking a hot shower.  He denies any tobacco, alcohol, cocaine, or IV drug use.  He says he is not taking any medications other than as needed Tylenol.  ED Course:  Initial vitals showed BP 162/117, pulse 72, RR 16, temp 97.9 Fahrenheit, SPO2 100% on room air.  Labs notable for WBC 3.9, hemoglobin 14.2, platelets 184.  BUN 48, creatinine 4.95, GFR 17 (07/16/17: BUN 30, creatinine 3.59, GFR 25).  Urinalysis negative for UTI.  CBG on arrival was 61, repeat 101.  He was given IV Zofran once, Antivert once, and Maalox.  He was given 1 L normal saline.  He had continued symptoms and difficulty with ambulation therefore the hospitalist service was consulted to admit for further management.  Review of Systems: As per HPI otherwise 10 point review of systems negative.    Past Medical History:  Diagnosis Date  . Bipolar 1 disorder ([place])   . Depression   . Hearing difficulty of left ear    75% hearing  . 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  . Hypertension   . Renal disorder     Past  Surgical History:  Procedure Laterality Date  . APPENDECTOMY    . spinal tap       reports that he quit smoking about 2 years ago. His smoking use included cigarettes. He smoked 0.30 packs per day. He has never used smokeless tobacco. He reports that he does not drink alcohol or use drugs.  Allergies  Allergen Reactions  . Other Other (See Comments)    Patient stated that he goes to Dr. Clover Mealy for his kidney problems and only takes medication that she gives him.   . Nsaids Other (See Comments)    Kidney problems     Family History  Problem Relation Age of Onset  . Hypertension Mother   . Kidney failure Mother      Prior to Admission medications   Medication Sig Start Date End Date Taking? Authorizing Provider  acetaminophen (TYLENOL) 500 MG tablet Take 1,000 mg by mouth every 6 (six) hours as needed for mild pain.    [provider]  amLODipine (NORVASC) 5 MG tablet Take 5 mg by mouth daily.    [provider]  HYDROcodone-acetaminophen (NORCO/VICODIN) 5-325 MG tablet Take 1 tablet by mouth every 6 (six) hours as needed for moderate pain or severe pain. 02/23/18   [name], PA-C    Physical Exam: Vitals:   06/07/18 1319 06/07/18 1320 06/07/18 1546 06/07/18 1804  BP: (!) 148/109  (!) 140/109 Marland Kitchen)  160/104  Pulse: 80  72 85  Resp: 16  18 16   Temp:   98.9 F (37.2 C) 98.9 F (37.2 C)  TempSrc:   Oral   SpO2: 100%  100% 100%  Weight:  83.9 kg    Height:  5\' 8"  (1.727 m)      Constitutional: NAD, calm, comfortable Eyes: PERRL, lids and conjunctivae normal ENMT: Mucous membranes are moist. Posterior pharynx clear of any exudate or lesions.Normal dentition.  Neck: normal, supple, no masses. Respiratory: clear to auscultation bilaterally, no wheezing, no crackles. Normal respiratory effort. No accessory muscle use.  Cardiovascular: Regular rate and rhythm, no murmurs / rubs / gallops. No extremity edema. 2+ pedal pulses. Abdomen: no tenderness, no  masses palpated. No hepatosplenomegaly. Bowel sounds positive.  Musculoskeletal: no clubbing / cyanosis. No joint deformity upper and lower extremities. Good ROM, no contractures. Normal muscle tone.  Skin: no rashes, lesions, ulcers. No induration Neurologic: CN 2-12 grossly intact. Sensation intact, DTR normal. Strength 5/5 in all 4.  Psychiatric: Normal judgment and insight. Alert and oriented x 3. Normal mood.     Labs on Admission: I have personally reviewed following labs and imaging studies  CBC: Recent Labs  Lab 06/07/18 1321  WBC 3.9*  HGB 14.2  HCT 44.3  MCV 92.9  PLT 951   Basic Metabolic Panel: Recent Labs  Lab 06/07/18 1321  NA 139  K 4.4  CL 110  CO2 17*  GLUCOSE 82  BUN 48*  CREATININE 4.95*  CALCIUM 9.2   GFR: Estimated Creatinine Clearance: 23 mL/min (A) (by C-G formula based on SCr of 4.95 mg/dL (H)). Liver Function Tests: No results for input(s): AST, ALT, ALKPHOS, BILITOT, PROT, ALBUMIN in the last 168 hours. No results for input(s): LIPASE, AMYLASE in the last 168 hours. No results for input(s): AMMONIA in the last 168 hours. Coagulation Profile: No results for input(s): INR, PROTIME in the last 168 hours. Cardiac Enzymes: No results for input(s): CKTOTAL, CKMB, CKMBINDEX, TROPONINI in the last 168 hours. BNP (last 3 results) No results for input(s): PROBNP in the last 8760 hours. HbA1C: No results for input(s): HGBA1C in the last 72 hours. CBG: Recent Labs  Lab 06/07/18 1648 06/07/18 1859  GLUCAP 61* 101*   Lipid Profile: No results for input(s): CHOL, HDL, LDLCALC, TRIG, CHOLHDL, LDLDIRECT in the last 72 hours. Thyroid Function Tests: No results for input(s): TSH, T4TOTAL, FREET4, T3FREE, THYROIDAB in the last 72 hours. Anemia Panel: No results for input(s): VITAMINB12, FOLATE, FERRITIN, TIBC, IRON, RETICCTPCT in the last 72 hours. Urine analysis:    Component Value Date/Time   COLORURINE STRAW (A) 06/07/2018 1645   APPEARANCEUR  CLEAR 06/07/2018 1645   LABSPEC 1.009 06/07/2018 1645   PHURINE 5.0 06/07/2018 1645   GLUCOSEU NEGATIVE 06/07/2018 1645   HGBUR SMALL (A) 06/07/2018 1645   BILIRUBINUR NEGATIVE 06/07/2018 1645   KETONESUR NEGATIVE 06/07/2018 1645   PROTEINUR 100 (A) 06/07/2018 1645   UROBILINOGEN 0.2 05/29/2013 1121   NITRITE NEGATIVE 06/07/2018 1645   LEUKOCYTESUR NEGATIVE 06/07/2018 1645    Radiological Exams on Admission: No results found.  EKG: Independently reviewed.  Sinus rhythm, LVH, nonspecific T wave change in lead III  Assessment/Plan Principal Problem:   Dehydration Active Problems:   Hypertension   CKD (chronic kidney disease), stage IV (HCC)   Nausea and vomiting  Maxine L Neto is a 33 y.o. male with medical history significant for Alport syndrome with CKD Stage IV, hypertension, hearing loss, and marijuana use who  presents the ED with persistent nausea and vomiting.  Dehydration secondary to nausea and vomiting: Suspect due to cannabinoid hyperemesis syndrome.  Discussed this possibility with patient, however he does not believe marijuana use can contribute and does not want to discuss further.  Low CBG of 61 in the ED likely related to her poor p.o. intake. -Admit for IV fluid resuscitation and as needed antiemetics overnight -Diet as tolerated  CKD stage IV: Questionable acute on chronic kidney injury.  Likely prerenal from above.  IV fluids overnight and recheck in a.m.  Hypertension: Patient states he is not taking any medications for this anymore.  BP is elevated.  Previously prescribed amlodipine 5 mg daily.   ADDENDUM: Notified by nurse that patient may leave [facility].  On admission I discussed the importance of continued IV fluid resuscitation control of his nausea vomiting to ensure ability to maintain adequate oral intake prior to discharge.  He seemed to understand and agree.  He is otherwise alert, oriented, capable of making his own decisions  regarding continue medical management and risks versus benefits of admission versus leaving.  Patient left AMA from the ED.   DVT prophylaxis: subq heparin Code Status: Full code Family Communication: Wife at bedside Disposition Plan: Likely discharge to home Consults called: None Admission status: Observation   Zada Finders MD [facility] Pager [phone]  If 7PM-7AM, please contact night-coverage [email or web address]  06/07/2018, 8:24 PM

## 2018-06-07 NOTE — ED Notes (Signed)
Ambulated pt around the pod. Pt stated that he still feels weird and kind of nauseated. Pt given an emesis bag

## 2018-06-07 NOTE — ED Notes (Signed)
Pt is CAOx4 with a CBG of 61 mg/dL. Orange juice and cheese crackers provided.

## 2018-06-09 ENCOUNTER — Encounter (HOSPITAL_COMMUNITY): Payer: Self-pay | Admitting: Psychiatry

## 2018-06-09 ENCOUNTER — Ambulatory Visit (INDEPENDENT_AMBULATORY_CARE_PROVIDER_SITE_OTHER): Payer: Medicaid Other | Admitting: Psychiatry

## 2018-06-09 VITALS — BP 140/82 | Ht 68.0 in | Wt 191.0 lb

## 2018-06-09 DIAGNOSIS — F1220 Cannabis dependence, uncomplicated: Secondary | ICD-10-CM

## 2018-06-09 DIAGNOSIS — F319 Bipolar disorder, unspecified: Secondary | ICD-10-CM
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
## 2018-12-21 ENCOUNTER — Ambulatory Visit (INDEPENDENT_AMBULATORY_CARE_PROVIDER_SITE_OTHER): Payer: Medicaid Other | Admitting: Psychiatry

## 2018-12-21 ENCOUNTER — Other Ambulatory Visit: Payer: Self-pay

## 2018-12-21 ENCOUNTER — Emergency Department (HOSPITAL_COMMUNITY)
Admission: EM | Admit: 2018-12-21 | Discharge: 2018-12-22 | Disposition: A | Discharge status: Home / Self Care | Payer: Medicaid Other | Attending: Emergency Medicine | Admitting: Emergency Medicine

## 2018-12-21 ENCOUNTER — Encounter (HOSPITAL_COMMUNITY): Payer: Self-pay | Admitting: Psychiatry

## 2018-12-21 ENCOUNTER — Emergency Department (HOSPITAL_COMMUNITY): Payer: Medicaid Other

## 2018-12-21 ENCOUNTER — Encounter (HOSPITAL_COMMUNITY): Payer: Self-pay | Admitting: *Deleted

## 2018-12-21 DIAGNOSIS — F1220 Cannabis dependence, uncomplicated: Secondary | ICD-10-CM | POA: Diagnosis not present

## 2018-12-21 DIAGNOSIS — N179 Acute kidney failure, unspecified: Secondary | ICD-10-CM | POA: Insufficient documentation

## 2018-12-21 DIAGNOSIS — I129 Hypertensive chronic kidney disease with stage 1 through stage 4 chronic kidney disease, or unspecified chronic kidney disease: Secondary | ICD-10-CM | POA: Diagnosis not present

## 2018-12-21 DIAGNOSIS — N184 Chronic kidney disease, stage 4 (severe): Secondary | ICD-10-CM | POA: Insufficient documentation

## 2018-12-21 DIAGNOSIS — F319 Bipolar disorder, unspecified: Secondary | ICD-10-CM | POA: Diagnosis not present

## 2018-12-21 DIAGNOSIS — R079 Chest pain, unspecified: Secondary | ICD-10-CM | POA: Diagnosis present

## 2018-12-21 DIAGNOSIS — Z79899 Other long term (current) drug therapy: Secondary | ICD-10-CM | POA: Diagnosis not present

## 2018-12-21 DIAGNOSIS — Z87891 Personal history of nicotine dependence: Secondary | ICD-10-CM | POA: Insufficient documentation

## 2018-12-21 LAB — CBC
HCT: 35.8 % — ABNORMAL LOW (ref 39.0–52.0)
Hemoglobin: 11.6 g/dL — ABNORMAL LOW (ref 13.0–17.0)
MCH: 31.8 pg (ref 26.0–34.0)
MCHC: 32.4 g/dL (ref 30.0–36.0)
MCV: 98.1 fL (ref 80.0–100.0)
Platelets: 157 10*3/uL (ref 150–400)
RBC: 3.65 MIL/uL — ABNORMAL LOW (ref 4.22–5.81)
RDW: 12.8 % (ref 11.5–15.5)
WBC: 3.9 10*3/uL — ABNORMAL LOW (ref 4.0–10.5)
nRBC: 0 % (ref 0.0–0.2)

## 2018-12-21 LAB — BASIC METABOLIC PANEL
Anion gap: 9 (ref 5–15)
BUN: 41 mg/dL — ABNORMAL HIGH (ref 6–20)
CO2: 20 mmol/L — ABNORMAL LOW (ref 22–32)
Calcium: 8.3 mg/dL — ABNORMAL LOW (ref 8.9–10.3)
Chloride: 111 mmol/L (ref 98–111)
Creatinine, Ser: 7.02 mg/dL — ABNORMAL HIGH (ref 0.61–1.24)
GFR calc Af Amer: 11 mL/min — ABNORMAL LOW (ref >60)
GFR calc non Af Amer: 9 mL/min — ABNORMAL LOW (ref >60)
Glucose, Bld: 93 mg/dL (ref 70–99)
Potassium: 4.7 mmol/L (ref 3.5–5.1)
Sodium: 140 mmol/L (ref 135–145)

## 2018-12-21 LAB — TROPONIN I (HIGH SENSITIVITY): Troponin I (High Sensitivity): 6 ng/L (ref <18)

## 2018-12-21 MED ORDER — SODIUM CHLORIDE 0.9% FLUSH: 3.0000 mL | Freq: Once | INTRAVENOUS | Status: DC

## 2018-12-21 MED ORDER — BENZTROPINE MESYLATE 0.5 MG PO TABS: 0.5000 mg | ORAL_TABLET | Freq: Every day | ORAL | 0 refills | Status: DC

## 2018-12-21 MED ORDER — RISPERIDONE 1 MG PO TABS: 1.0000 mg | ORAL_TABLET | Freq: Every day | ORAL | 0 refills | Status: DC

## 2018-12-21 NOTE — ED Triage Notes (Signed)
Pt has been having sob and indigestion. Has tried tums without improvement. Also reports generalized fatigue and not feeling well.  Pt is also hard of hearing and lip reads

## 2018-12-21 NOTE — Progress Notes (Signed)
Virtual Visit via Telephone Note  I connected with [name] on 12/21/18 at  2:40 PM EDT by telephone and verified that I am speaking with the correct person using two identifiers.   I discussed the limitations, risks, security and privacy concerns of performing an evaluation and management service by telephone and the availability of in person appointments. I also discussed with the patient that there may be a patient responsible charge related to this service. The patient expressed understanding and agreed to proceed.   History of Present Illness: [name] is 33 year old African-American matted man who was last seen in January 2020.  He was referred from transplant team from [facility] as he was off from his psychiatric medication and in the process of evaluation for kidney transplant.  Patient needed a follow-up appointment today because he is experiencing a lot of irritability, anger, severe mood swing and poor sleep.  On his last appointment he did not show any interest to get treatment and he felt that he does not need to be on any medication.  He was using marijuana which he is still uses regularly.  He justify his marijuana use and believes is a free country and no one can stop it.  Recently he noticed his irritability and mood swings is getting worse.  His wife recommended that he should get the treatment.  Few weeks ago neighbor called the police when he was very loud, screaming and agitated.  He also admitted that sometimes gets very emotional.  He is not sleeping very well.  He admitted feeling paranoia that people talking about him and they are very judgmental but denies any hallucination or any suicidal thoughts.  He wants to go back on once a month injection which he used to take in the past when he was getting treatment at [facility] in [place].  Do not recall the name of the injection but he felt it was helping his symptoms or at least his family believe  that he was much calmer.  Patient is working as a Nutritional therapist but he admitted losing his temper and mood very quickly.  Patient has chronic kidney disease, Alport syndrome, peripheral neuropathy, hypertension and hearing impairment.  He mentioned he is a still in the process of evaluation to be on transplant list.  He lives with his wife and 4 kids.  Now he believes that he needs medication because his rate is getting out of control.  Denies any suicidal thoughts or any homicidal thoughts.  He is a poor historian due to his hearing impairment and most of the information was obtained through electronic medical record.  Currently he is only taking medicine for his hypertension.  As per electronic medical record he had tried in the past Depakote, Risperdal and Invega injection.  He reported his appetite is okay and his weight is stable.   Past psychiatric history; History of bipolar disorder, paranoia, rage, anger, cannabis use.  History of multiple times locked up but did not have details.  Multiple visits to the emergency room due to noncompliance with medication.  History of cutting his wrist with a razor blade.  As per EMR taken Depakote, Risperdal and Invega injection.   Psychiatric Specialty Exam: Physical Exam  ROS  There were no vitals taken for this visit.There is no height or weight on file to calculate BMI.  General Appearance: NA  Eye Contact:  NA  Speech:  Slow and Difficulty in hearing.  Volume:  Increased  Mood:  Depressed and Irritable  Affect:  NA  Thought Process:  Descriptions of Associations: Intact  Orientation:  Full (Time, Place, and Person)  Thought Content:  Paranoid Ideation, Rumination and Difficulty trusting people  Suicidal Thoughts:  No  Homicidal Thoughts:  No  Memory:  Immediate;   Fair Recent;   Fair Remote;   Poor  Judgement:  Other:  Limited  Insight:  Lacking  Psychomotor Activity:  NA  Concentration:  Concentration: Fair and Attention Span: Fair   Recall:  [employer] of Knowledge:  Fair  Language:  Fair  Akathisia:  No  Handed:  Right  AIMS (if indicated):     Assets:  Desire for Improvement Housing Social Support  ADL's:  Intact  Cognition:  WNL  Sleep:   Poor      Assessment and Plan: Bipolar disorder type I.  Cannabis use.  I reviewed his chart, current medication.  Patient has multiple hospitalization due to decompensation and noncompliance with medication.  However now he is interested to get treatment because he feels that his mood is getting worse.  He like to go back on injection.  Currently he has been noncompliant with medication and having a lot of symptoms of mood lability and insomnia.  We will start Risperdal 1 mg at bedtime.  After 2 weeks we will start Risperdal Consta 25 mg intramuscular every 2 weeks if he has no concerns, issues or any side effects.  I will also start Cogentin 0.5 mg at bedtime to help EPS tremors or shakes.  In the past he has taken Risperdal.  We will get records from [employer] care services at [facility] where he was getting treatment before.  Discussed medication side effects and benefits in detail.  We also talked about stopping cannabis and he promised that he will work on it.  He is not interested in therapy at this time.  Recommended to call us back if is any question or any concern.  Discussed safety concern that anytime having active suicidal thoughts or homicidal family need to call 911 or go to local emergency room.  We will get blood work on his next appointment.  Follow-up in 4 weeks.   Follow Up Instructions:    I discussed the assessment and treatment plan with the patient. The patient was provided an opportunity to ask questions and all were answered. The patient agreed with the plan and demonstrated an understanding of the instructions.   The patient was advised to call back or seek an in-person evaluation if the symptoms worsen or if the condition fails to  improve as anticipated.  I provided 30 minutes of non-face-to-face time during this encounter.   Kathlee Nations, MD

## 2018-12-22 LAB — TROPONIN I (HIGH SENSITIVITY): Troponin I (High Sensitivity): 5 ng/L (ref <18)

## 2018-12-22 MED ORDER — ALBUTEROL SULFATE HFA 108 (90 BASE) MCG/ACT IN AERS
2.0000 | INHALATION_SPRAY | Freq: Once | RESPIRATORY_TRACT | Status: DC
  Filled 2018-12-22: qty 6.7

## 2018-12-22 NOTE — Discharge Instructions (Signed)
We saw in the ER for chest discomfort. Labs here show that your kidney function is actually getting worse.  It is prudent that you follow-up with nephrologist this week.

## 2018-12-22 NOTE — ED Provider Notes (Signed)
[facility] EMERGENCY DEPARTMENT Provider Note   CSN: [id number] Arrival date & time: 12/21/18  2237    History   Chief Complaint Chief Complaint  Patient presents with  . Shortness of Breath  . Chest Pain    HPI [name] is a 33 y.o. male.     HPI 33 year old male comes in a chief complaint of chest pain and shortness of breath. Patient has history of hypertension, CKD, depression and bipolar disorder.  He reports that he has been having heartburn all of his life, but over the past few days the symptoms have been more severe.  He is having midsternal and substernal chest discomfort described as burning pain.  He is also often feeling like he suffocated, but he has no new cough, fevers, chills.  Patient denies any sick contacts.  He does not have any underlying lung disease and denies any wheezing. Pt has no hx of PE, DVT and denies any exogenous hormone (testosterone / estrogen) use, long distance travels or surgery in the past 6 weeks, active cancer, recent immobilization.   Past Medical History:  Diagnosis Date  . Bipolar 1 disorder ([place])   . Depression   . Hearing difficulty of left ear    75% hearing  . 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  . Hypertension   . Renal disorder     Patient Active Problem List   Diagnosis Date Noted  . Dehydration 06/07/2018  . Hypertension 06/07/2018  . CKD (chronic kidney disease), stage IV ([place]) 06/07/2018  . Nausea and vomiting 06/07/2018  . Alport syndrome 07/14/2017  . GERD (gastroesophageal reflux disease) 07/14/2017  . History of appendectomy 07/14/2017  . Obesity (BMI 30.0-34.9) 07/14/2017  . Pre-transplant evaluation for kidney transplant 07/14/2017  . Secondary hyperparathyroidism ([place]) 07/14/2017  . Tobacco use 07/14/2017  . Unspecified hypertensive kidney disease with chronic kidney disease stage I through stage IV, or unspecified(403.90) 07/14/2017  . Bipolar 1 disorder ([place])  09/01/2013  . 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 APPENDECTOMY    . spinal tap          Home Medications    Prior to Admission medications   Medication Sig Start Date End Date Taking? Authorizing Provider  acetaminophen (TYLENOL) 500 MG tablet Take 1,000-1,500 mg by mouth every 6 (six) hours as needed for headache (pain).     [provider]  amLODipine (NORVASC) 5 MG tablet Take 5 mg by mouth daily.    [provider]  benztropine (COGENTIN) 0.5 MG tablet Take 1 tablet (0.5 mg total) by mouth at bedtime. 12/21/18 12/21/19  Arfeen, Arlyce Harman, MD  HYDROcodone-acetaminophen (NORCO/VICODIN) 5-325 MG tablet Take 1 tablet by mouth every 6 (six) hours as needed for moderate pain or severe pain. Patient not taking: Reported on 06/07/2018 02/23/18   [name], PA-C  risperiDONE (RISPERDAL) 1 MG tablet Take 1 tablet (1 mg total) by mouth at bedtime. 12/21/18 12/21/19  Kathlee Nations, MD    Family History Family History  Problem Relation Age of Onset  . Hypertension Mother   . Kidney failure Mother     Social History Social History   Tobacco Use  . Smoking status: Former Smoker    Packs/day: 0.30    Types: Cigarettes    Quit date: 02/27/2016    Years since quitting: 2.8  . Smokeless tobacco: Never Used  Substance Use Topics  .  Alcohol use: No    Comment: rare  . Drug use: No     Allergies   Other and Nsaids   Review of Systems Review of Systems  Constitutional: Negative for diaphoresis.  Respiratory: Positive for shortness of breath.   Cardiovascular: Positive for chest pain.  Gastrointestinal: Negative for nausea and vomiting.  All other systems reviewed and are negative.    Physical Exam Updated Vital Signs BP (!) 156/94   Pulse 68   Temp 98.5 F (36.9 C) (Oral)   Resp 18   SpO2 98%   Physical Exam Vitals signs and nursing note reviewed.  Constitutional:      Appearance: He is  well-developed.  HENT:     Head: Atraumatic.  Neck:     Musculoskeletal: Neck supple.  Cardiovascular:     Rate and Rhythm: Normal rate.  Pulmonary:     Effort: Pulmonary effort is normal.     Breath sounds: No decreased breath sounds, wheezing, rhonchi or rales.  Skin:    General: Skin is warm.  Neurological:     Mental Status: [name] and oriented to person, place, and time.     ED Treatments / Results  Labs (all labs ordered are listed, but only abnormal results are displayed) Labs Reviewed  BASIC METABOLIC PANEL - Abnormal; Notable for the following components:      Result Value   CO2 20 (*)    BUN 41 (*)    Creatinine, Ser 7.02 (*)    Calcium 8.3 (*)    GFR calc non Af Amer 9 (*)    GFR calc Af Amer 11 (*)    All other components within normal limits  CBC - Abnormal; Notable for the following components:   WBC 3.9 (*)    RBC 3.65 (*)    Hemoglobin 11.6 (*)    HCT 35.8 (*)    All other components within normal limits  TROPONIN I (HIGH SENSITIVITY)  TROPONIN I (HIGH SENSITIVITY)    EKG EKG Interpretation  Date/Time:  Wednesday December 22 2018 00:19:03 EDT Ventricular Rate:  55 PR Interval:    QRS Duration: 98 QT Interval:  420 QTC Calculation: 402 R Axis:   51 Text Interpretation:  Sinus rhythm Minimal ST depression, diffuse leads Nonspecific ST and T wave abnormality No significant change since last tracing Confirmed by Varney Biles [id number]) on 12/22/2018 12:24:22 AM   Radiology Dg Chest 2 View  Result Date: 12/21/2018 CLINICAL DATA:  Shortness of breath EXAM: CHEST - 2 VIEW COMPARISON:  July 16, 2017. FINDINGS: The heart size and mediastinal contours are within normal limits. Both lungs are clear. The visualized skeletal structures are unremarkable. IMPRESSION: No acute cardiopulmonary process. Electronically Signed   By: Prudencio Pair M.D.   On: 12/21/2018 23:27    Procedures Procedures (including critical care time)  Medications Ordered in ED  Medications  sodium chloride flush (NS) 0.9 % injection 3 mL (has no administration in time range)  albuterol (VENTOLIN HFA) 108 (90 Base) MCG/ACT inhaler 2 puff (has no administration in time range)     Initial Impression / Assessment and Plan / ED Course  I have reviewed the triage vital signs and the nursing notes.  Pertinent labs & imaging results that were available during my care of the patient were reviewed by me and considered in my medical decision making (see chart for details).        33 year old comes in a chief complaint of chest pain.  His chest pain is described as heartburn type pain which he normally has, but the current pain is worse than usual.  The pain is fairly constant and there is no specific evoking, aggravating or relieving factors besides food.  Patient's chest pain does not appear to be ACS type in nature.  EKG is not showing any ischemic findings.  Initial high-sensitivity troponin was lower than the institutional cutoff.  Repeat troponin will not be required.  Additionally patient is complaining of shortness of breath, described as suffocating type feeling.  The symptoms occur intermittently.  He has known history of Alport syndrome and CKD.  He denies substance abuse and there is no PE risk factors.  He is also denying any cough, infection-like symptoms  and has no underlying lung disease.  Chest x-ray ordered and there is no evidence of pneumonia, pulmonary edema, pleural effusion.  Lungs were clear.  We gave him inhaler to see if that helps him, if it feels to the patient that the inhaler alleviated his symptoms and he can take it with him.  Labs do show that patient is having acute on chronic renal failure.  Fortunately he does not have indications for emergent hemodialysis.  I discussed the case with nephrologist on call to see if it is better it for patient to be admitted or closely  -and Dr. Augustin Coupe, nephrologist on call would prefer that patient be seen this  week by his nephrologist.  I have sent a secure message to Dr. Moshe Cipro, patient's nephrologist.  Patient has been made aware of the finding, and he is in agreement with the plan, quite aware that he might need to have further discussion of dialysis and transplant.  Final Clinical Impressions(s) / ED Diagnoses   Final diagnoses:  Acute renal failure superimposed on stage 4 chronic kidney disease, unspecified acute renal failure type [facility])    ED Discharge Orders    None       Varney Biles, MD 12/22/18 [phone]

## 2019-01-04 ENCOUNTER — Ambulatory Visit (HOSPITAL_COMMUNITY): Payer: Medicaid Other

## 2019-01-21 ENCOUNTER — Other Ambulatory Visit: Payer: Self-pay

## 2019-01-21 ENCOUNTER — Encounter (HOSPITAL_COMMUNITY): Payer: Medicaid Other | Admitting: Psychiatry

## 2019-01-21 NOTE — Progress Notes (Signed)
I called to connect with the patient.  His voicemail had a message that at this time his call cannot be connected.  He will be no-show.

## 2019-01-31 ENCOUNTER — Other Ambulatory Visit: Payer: Self-pay | Admitting: Vascular Surgery

## 2019-01-31 DIAGNOSIS — N184 Chronic kidney disease, stage 4 (severe): Secondary | ICD-10-CM

## 2019-02-18 ENCOUNTER — Encounter: Payer: Self-pay | Admitting: *Deleted

## 2019-02-18 ENCOUNTER — Ambulatory Visit (INDEPENDENT_AMBULATORY_CARE_PROVIDER_SITE_OTHER)
Admission: RE | Admit: 2019-02-18 | Discharge: 2019-02-18 | Disposition: A | Discharge status: Home / Self Care | Payer: Medicaid Other | Source: Ambulatory Visit | Attending: Vascular Surgery | Admitting: Vascular Surgery

## 2019-02-18 ENCOUNTER — Ambulatory Visit (INDEPENDENT_AMBULATORY_CARE_PROVIDER_SITE_OTHER): Payer: Medicaid Other | Admitting: Vascular Surgery

## 2019-02-18 ENCOUNTER — Encounter: Payer: Self-pay | Admitting: Vascular Surgery

## 2019-02-18 ENCOUNTER — Other Ambulatory Visit: Payer: Self-pay

## 2019-02-18 ENCOUNTER — Ambulatory Visit (HOSPITAL_COMMUNITY)
Admission: RE | Admit: 2019-02-18 | Discharge: 2019-02-18 | Disposition: A | Discharge status: Home / Self Care | Payer: Medicaid Other | Source: Ambulatory Visit | Attending: Vascular Surgery | Admitting: Vascular Surgery

## 2019-02-18 VITALS — BP 157/100 | HR 70 | Temp 97.7°F | Resp 20 | Ht 68.0 in | Wt 191.0 lb

## 2019-02-18 DIAGNOSIS — N184 Chronic kidney disease, stage 4 (severe): Secondary | ICD-10-CM | POA: Diagnosis not present

## 2019-02-18 NOTE — Progress Notes (Signed)
Patient ID: [name], male   DOB: 11-21-1985, 33 y.o.   MRN: [id number]  Reason for Consult: New Patient (Initial Visit)   Referred by Group, [facility]  Subjective:     HPI:  [name] is a 32 y.o. male history of hypertension chronic kidney disease.  He is now here for dialysis access.  He is right-hand dominant.  He is extremely hard of hearing I discussed much of this on the phone with his wife who cannot be here today.  She states that he is very close to needing dialysis.  Past Medical History:  Diagnosis Date  . Bipolar 1 disorder ([place])   . Depression   . Hearing difficulty of left ear    75% hearing  . 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  . Hypertension   . Renal disorder    Family History  Problem Relation Age of Onset  . Hypertension Mother   . Kidney failure Mother    Past Surgical History:  Procedure Laterality Date  . APPENDECTOMY    . spinal tap      Short Social History:  Social History   Tobacco Use  . Smoking status: Former Smoker    Packs/day: 0.30    Types: Cigarettes    Quit date: 02/27/2016    Years since quitting: 2.9  . Smokeless tobacco: Never Used  Substance Use Topics  . Alcohol use: No    Comment: rare    Allergies  Allergen Reactions  . Other Other (See Comments)    Patient stated that he goes to [name] for his kidney problems and only takes medication that she gives him.   . Nsaids Other (See Comments)    Kidney problems     Current Outpatient Medications  Medication Sig Dispense Refill  . acetaminophen (TYLENOL) 500 MG tablet Take 1,000-1,500 mg by mouth every 6 (six) hours as needed for headache (pain).     . benztropine (COGENTIN) 0.5 MG tablet Take 1 tablet (0.5 mg total) by mouth at bedtime. 30 tablet 0  . risperiDONE (RISPERDAL) 1 MG tablet Take 1 tablet (1 mg total) by mouth at bedtime. 30 tablet 0  . amLODipine (NORVASC) 5 MG tablet Take 5 mg by mouth daily.     No current  facility-administered medications for this visit.     Review of Systems  Constitutional:  Constitutional negative. HENT:       Hearing loss Eyes: Eyes negative.  Respiratory: Respiratory negative.  Cardiovascular: Cardiovascular negative.  GI: Gastrointestinal negative.  Musculoskeletal: Musculoskeletal negative.  Skin: Skin negative.  Neurological: Neurological negative. Hematologic: Hematologic/lymphatic negative.  Psychiatric: Psychiatric negative.        Objective:  Objective   Vitals:   02/18/19 1210  BP: (!) 157/100  Pulse: 70  Resp: 20  Temp: 97.7 F (36.5 C)  SpO2: 99%  Weight: 191 lb (86.6 kg)  Height: 5\' 8"  (1.727 m)   Body mass index is 29.04 kg/m.  Physical Exam HENT:     Head: Normocephalic.     Mouth/Throat:     Mouth: Mucous membranes are moist.  Cardiovascular:     Rate and Rhythm: Normal rate.     Pulses:          Radial pulses are 2+ on the right side and 2+ on the left side.  Pulmonary:     Effort: Pulmonary effort is normal.  Abdominal:     General: Abdomen is flat.  Palpations: Abdomen is soft.  Musculoskeletal: Normal range of motion.        General: No swelling or deformity.  Skin:    General: Skin is warm and dry.     Capillary Refill: Capillary refill takes less than 2 seconds.  Neurological:     General: No focal deficit present.     Mental Status: [name].  Psychiatric:        Mood and Affect: Mood normal.        Behavior: Behavior normal.        Thought Content: Thought content normal.        Judgment: Judgment normal.     Data: I have independently interpreted his bilateral upper extremity duplexes which demonstrate brachial artery on the right 0.5 cm on the left 0.45 cm.  I have independently interpreted his bilateral upper extremity vein mapping which measures right cephalic [id number] cm in the upper arm basilic is 0.3 or greater throughout the right upper arm.  Left cephalic vein 0000000 at the antecubital fossa  becomes smaller throughout.  Left basilic vein [id number] in the antecubital fossa does not up to 0.41 higher in the arm.     Assessment/Plan:     33 year old male with advanced chronic kidney disease.  Plan will be for likely left arm AV fistula versus graft.  I discussed with patient that this will require 3 months of maturation.  According to his wife he is getting close to dialysis may require catheter.  We will contact [name] office prior to scheduling surgery so he can be scheduled accordingly with possible tunneled dialysis catheter placement at the time of surgery.     Edward Sandy MD [facility]

## 2019-02-22 ENCOUNTER — Encounter: Payer: Self-pay | Admitting: *Deleted

## 2019-02-22 NOTE — Progress Notes (Signed)
No TDC needed per [place] at [facility].

## 2019-02-28 ENCOUNTER — Other Ambulatory Visit (HOSPITAL_COMMUNITY)
Admission: RE | Admit: 2019-02-28 | Discharge: 2019-02-28 | Disposition: A | Discharge status: Home / Self Care | Payer: Medicaid Other | Source: Ambulatory Visit | Attending: Vascular Surgery | Admitting: Vascular Surgery

## 2019-02-28 DIAGNOSIS — Z20828 Contact with and (suspected) exposure to other viral communicable diseases: Secondary | ICD-10-CM | POA: Diagnosis not present

## 2019-02-28 DIAGNOSIS — Z01812 Encounter for preprocedural laboratory examination: Secondary | ICD-10-CM | POA: Diagnosis not present

## 2019-03-01 LAB — NOVEL CORONAVIRUS, NAA (HOSP ORDER, SEND-OUT TO REF LAB; TAT 18-24 HRS): SARS-CoV-2, NAA: NOT DETECTED

## 2019-03-02 ENCOUNTER — Other Ambulatory Visit: Payer: Self-pay

## 2019-03-02 ENCOUNTER — Encounter (HOSPITAL_COMMUNITY): Payer: Self-pay | Admitting: *Deleted

## 2019-03-02 NOTE — Progress Notes (Signed)
SDW-pre-op call completed by pt spouse, [place] Constable, as requested. Spouse denies that pt C/O SOB and chest pain. Spouse denies that pt is under the care of a cardiologist . Spouse stated that pt receives primary care at [place] and nephrologist is Dr. Clover Mealy. Spouse denies that pt had a stress test and cardiac cath. Spouse denies that pt had a chest x ray in the last year. Spouse made aware to have pt stop taking  vitamins, fish oil and herbal medications. Do not take any NSAIDs ie: Ibuprofen, Advil, Naproxen (Aleve), Motrin, BC and Goody Powder. Spouse verbalized understanding of all pre-op instructions.

## 2019-03-02 NOTE — Progress Notes (Signed)
Pt spouse stated that pt is not trained in sign language but can " read her lips." Per [place], okay for spouse to interpret for pt. Surgical scheduler stated that she would cancel sign language interpreter for DOS.

## 2019-03-03 ENCOUNTER — Encounter (HOSPITAL_COMMUNITY): Payer: Self-pay

## 2019-03-03 ENCOUNTER — Ambulatory Visit (HOSPITAL_COMMUNITY)
Admission: RE | Admit: 2019-03-03 | Discharge: 2019-03-03 | Disposition: A | Discharge status: Home / Self Care | Payer: Medicaid Other | Attending: Vascular Surgery | Admitting: Vascular Surgery

## 2019-03-03 ENCOUNTER — Encounter (HOSPITAL_COMMUNITY)
Admission: RE | Disposition: A | Discharge status: Home / Self Care | Payer: Self-pay | Source: Home / Self Care | Attending: Vascular Surgery

## 2019-03-03 ENCOUNTER — Ambulatory Visit (HOSPITAL_COMMUNITY): Payer: Medicaid Other | Admitting: Certified Registered Nurse Anesthetist

## 2019-03-03 DIAGNOSIS — I129 Hypertensive chronic kidney disease with stage 1 through stage 4 chronic kidney disease, or unspecified chronic kidney disease: Secondary | ICD-10-CM | POA: Diagnosis not present

## 2019-03-03 DIAGNOSIS — N184 Chronic kidney disease, stage 4 (severe): Secondary | ICD-10-CM

## 2019-03-03 DIAGNOSIS — E669 Obesity, unspecified: Secondary | ICD-10-CM | POA: Diagnosis not present

## 2019-03-03 DIAGNOSIS — F319 Bipolar disorder, unspecified: Secondary | ICD-10-CM | POA: Insufficient documentation

## 2019-03-03 DIAGNOSIS — Z87891 Personal history of nicotine dependence: Secondary | ICD-10-CM | POA: Insufficient documentation

## 2019-03-03 DIAGNOSIS — K219 Gastro-esophageal reflux disease without esophagitis: Secondary | ICD-10-CM | POA: Diagnosis not present

## 2019-03-03 DIAGNOSIS — Z6828 Body mass index (BMI) 28.0-28.9, adult: Secondary | ICD-10-CM | POA: Insufficient documentation

## 2019-03-03 DIAGNOSIS — Q8781 Alport syndrome: Secondary | ICD-10-CM | POA: Insufficient documentation

## 2019-03-03 DIAGNOSIS — N189 Chronic kidney disease, unspecified: Secondary | ICD-10-CM | POA: Diagnosis present

## 2019-03-03 HISTORY — PX: AV FISTULA PLACEMENT: SHX1204

## 2019-03-03 HISTORY — DX: Gastro-esophageal reflux disease without esophagitis: K21.9

## 2019-03-03 LAB — POCT I-STAT, CHEM 8
BUN: 53 mg/dL — ABNORMAL HIGH (ref 6–20)
Calcium, Ion: 1.08 mmol/L — ABNORMAL LOW (ref 1.15–1.40)
Chloride: 112 mmol/L — ABNORMAL HIGH (ref 98–111)
Creatinine, Ser: 7.8 mg/dL — ABNORMAL HIGH (ref 0.61–1.24)
Glucose, Bld: 86 mg/dL (ref 70–99)
HCT: 35 % — ABNORMAL LOW (ref 39.0–52.0)
Hemoglobin: 11.9 g/dL — ABNORMAL LOW (ref 13.0–17.0)
Potassium: 5.1 mmol/L (ref 3.5–5.1)
Sodium: 141 mmol/L (ref 135–145)
TCO2: 20 mmol/L — ABNORMAL LOW (ref 22–32)

## 2019-03-03 SURGERY — ARTERIOVENOUS (AV) FISTULA CREATION
Anesthesia: General | Site: Arm Upper | Laterality: Left

## 2019-03-03 MED ORDER — LIDOCAINE-EPINEPHRINE (PF) 1 %-1:200000 IJ SOLN
INTRAMUSCULAR | Status: AC
  Filled 2019-03-03: qty 30

## 2019-03-03 MED ORDER — ONDANSETRON HCL 4 MG/2ML IJ SOLN
INTRAMUSCULAR | Status: DC | PRN
  Administered 2019-03-03: 4 mg via INTRAVENOUS

## 2019-03-03 MED ORDER — GLYCOPYRROLATE PF 0.2 MG/ML IJ SOSY
PREFILLED_SYRINGE | INTRAMUSCULAR | Status: DC | PRN
  Administered 2019-03-03: .2 mg via INTRAVENOUS

## 2019-03-03 MED ORDER — 0.9 % SODIUM CHLORIDE (POUR BTL) OPTIME
TOPICAL | Status: DC | PRN
  Administered 2019-03-03: 1000 mL

## 2019-03-03 MED ORDER — SODIUM CHLORIDE 0.9 % IV SOLN
INTRAVENOUS | Status: DC
  Administered 2019-03-03 (×2): via INTRAVENOUS

## 2019-03-03 MED ORDER — FENTANYL CITRATE (PF) 100 MCG/2ML IJ SOLN
INTRAMUSCULAR | Status: AC
  Filled 2019-03-03: qty 2

## 2019-03-03 MED ORDER — LIDOCAINE 20MG/ML (2%) 15 ML SYRINGE OPTIME
INTRAMUSCULAR | Status: DC | PRN
  Administered 2019-03-03: 60 mg via INTRAVENOUS

## 2019-03-03 MED ORDER — SODIUM CHLORIDE 0.9 % IV SOLN
INTRAVENOUS | Status: AC
  Filled 2019-03-03: qty 1.2

## 2019-03-03 MED ORDER — CEFAZOLIN SODIUM-DEXTROSE 2-4 GM/100ML-% IV SOLN
INTRAVENOUS | Status: AC
  Filled 2019-03-03: qty 100

## 2019-03-03 MED ORDER — SODIUM CHLORIDE 0.9 % IV SOLN
INTRAVENOUS | Status: DC | PRN
  Administered 2019-03-03: 500 mL

## 2019-03-03 MED ORDER — CHLORHEXIDINE GLUCONATE 4 % EX LIQD: 60.0000 mL | Freq: Once | CUTANEOUS | Status: DC

## 2019-03-03 MED ORDER — PROPOFOL 10 MG/ML IV BOLUS
INTRAVENOUS | Status: DC | PRN
  Administered 2019-03-03: 200 mg via INTRAVENOUS

## 2019-03-03 MED ORDER — MIDAZOLAM HCL 2 MG/2ML IJ SOLN
INTRAMUSCULAR | Status: AC
  Filled 2019-03-03: qty 2

## 2019-03-03 MED ORDER — MIDAZOLAM HCL 5 MG/5ML IJ SOLN
INTRAMUSCULAR | Status: DC | PRN
  Administered 2019-03-03 (×2): 1 mg via INTRAVENOUS

## 2019-03-03 MED ORDER — FENTANYL CITRATE (PF) 100 MCG/2ML IJ SOLN
INTRAMUSCULAR | Status: DC | PRN
  Administered 2019-03-03 (×2): 25 ug via INTRAVENOUS
  Administered 2019-03-03 (×3): 50 ug via INTRAVENOUS

## 2019-03-03 MED ORDER — CEFAZOLIN SODIUM-DEXTROSE 2-4 GM/100ML-% IV SOLN
2.0000 g | INTRAVENOUS | Status: AC
  Administered 2019-03-03: 2 g via INTRAVENOUS

## 2019-03-03 MED ORDER — FENTANYL CITRATE (PF) 250 MCG/5ML IJ SOLN
INTRAMUSCULAR | Status: AC
  Filled 2019-03-03: qty 5

## 2019-03-03 MED ORDER — HYDROCODONE-ACETAMINOPHEN 5-325 MG PO TABS: 1.0000 | ORAL_TABLET | Freq: Four times a day (QID) | ORAL | 0 refills | Status: DC | PRN

## 2019-03-03 MED ORDER — FENTANYL CITRATE (PF) 100 MCG/2ML IJ SOLN
25.0000 ug | INTRAMUSCULAR | Status: DC | PRN
  Administered 2019-03-03 (×4): 50 ug via INTRAVENOUS

## 2019-03-03 MED ORDER — PROMETHAZINE HCL 25 MG/ML IJ SOLN: 6.2500 mg | INTRAMUSCULAR | Status: DC | PRN

## 2019-03-03 MED ORDER — LIDOCAINE-EPINEPHRINE (PF) 1 %-1:200000 IJ SOLN: INTRAMUSCULAR | Status: DC | PRN

## 2019-03-03 MED ORDER — PHENYLEPHRINE 40 MCG/ML (10ML) SYRINGE FOR IV PUSH (FOR BLOOD PRESSURE SUPPORT)
PREFILLED_SYRINGE | INTRAVENOUS | Status: AC
  Filled 2019-03-03: qty 10

## 2019-03-03 SURGICAL SUPPLY — 29 items
ADH SKN CLS APL DERMABOND .7 (GAUZE/BANDAGES/DRESSINGS) ×1
ARMBAND PINK RESTRICT EXTREMIT (MISCELLANEOUS) ×3 IMPLANT
CANISTER SUCT 3000ML PPV (MISCELLANEOUS) ×3 IMPLANT
CLIP VESOCCLUDE MED 6/CT (CLIP) ×3 IMPLANT
CLIP VESOCCLUDE SM WIDE 6/CT (CLIP) ×3 IMPLANT
COVER PROBE W GEL 5X96 (DRAPES) IMPLANT
COVER WAND RF STERILE (DRAPES) ×3 IMPLANT
DERMABOND ADVANCED (GAUZE/BANDAGES/DRESSINGS) ×2
DERMABOND ADVANCED .7 DNX12 (GAUZE/BANDAGES/DRESSINGS) ×1 IMPLANT
ELECT REM PT RETURN 9FT ADLT (ELECTROSURGICAL) ×3
ELECTRODE REM PT RTRN 9FT ADLT (ELECTROSURGICAL) ×1 IMPLANT
GLOVE BIO SURGEON STRL SZ7.5 (GLOVE) ×3 IMPLANT
GOWN STRL REUS W/ TWL LRG LVL3 (GOWN DISPOSABLE) ×2 IMPLANT
GOWN STRL REUS W/ TWL XL LVL3 (GOWN DISPOSABLE) ×1 IMPLANT
GOWN STRL REUS W/TWL LRG LVL3 (GOWN DISPOSABLE) ×6
GOWN STRL REUS W/TWL XL LVL3 (GOWN DISPOSABLE) ×3
INSERT FOGARTY SM (MISCELLANEOUS) IMPLANT
KIT BASIN OR (CUSTOM PROCEDURE TRAY) ×3 IMPLANT
KIT TURNOVER KIT B (KITS) ×3 IMPLANT
NS IRRIG 1000ML POUR BTL (IV SOLUTION) ×3 IMPLANT
PACK CV ACCESS (CUSTOM PROCEDURE TRAY) ×3 IMPLANT
PAD ARMBOARD 7.5X6 YLW CONV (MISCELLANEOUS) ×6 IMPLANT
SUT MNCRL AB 4-0 PS2 18 (SUTURE) ×3 IMPLANT
SUT PROLENE 6 0 BV (SUTURE) ×3 IMPLANT
SUT VIC AB 3-0 SH 27 (SUTURE) ×3
SUT VIC AB 3-0 SH 27X BRD (SUTURE) ×1 IMPLANT
TOWEL GREEN STERILE (TOWEL DISPOSABLE) ×3 IMPLANT
UNDERPAD 30X30 (UNDERPADS AND DIAPERS) ×3 IMPLANT
WATER STERILE IRR 1000ML POUR (IV SOLUTION) ×3 IMPLANT

## 2019-03-03 NOTE — H&P (Signed)
   History and Physical Update  The patient was interviewed and re-examined.  The patient's previous History and Physical has been reviewed and is unchanged from recent office visit. Plan for left arm avf vs graft today in OR.   Mahogani Holohan C. Donzetta Matters, MD [facility] Office: [phone] Pager: [phone]   03/03/2019, 11:31 AM

## 2019-03-03 NOTE — Anesthesia Procedure Notes (Signed)
Procedure Name: LMA Insertion Date/Time: 03/03/2019 11:59 AM Performed by: Lowella Dell, CRNA Pre-anesthesia Checklist: Patient identified, Emergency Drugs available, Suction available and Patient being monitored Patient Re-evaluated:Patient Re-evaluated prior to induction Oxygen Delivery Method: Circle System Utilized Preoxygenation: Pre-oxygenation with 100% oxygen Induction Type: IV induction Ventilation: Mask ventilation without difficulty LMA: LMA inserted LMA Size: 4.0 and 5.0 Number of attempts: 1 Airway Equipment and Method: Bite block Placement Confirmation: positive ETCO2 Tube secured with: Tape Dental Injury: Teeth and Oropharynx as per pre-operative assessment  Comments: Induction with Dr Roanna Banning.

## 2019-03-03 NOTE — Discharge Instructions (Signed)
° °  [facility] ° °Discharge Instructions ° °AV Fistula or Graft Surgery for Dialysis Access ° °Please refer to the following instructions for your post-procedure care. Your surgeon or physician assistant will discuss any changes with you. ° °Activity ° °You may drive the day following your surgery, if you are comfortable and no longer taking prescription pain medication. Resume full activity as the soreness in your incision resolves. ° °Bathing/Showering ° °You may shower after you go home. Keep your incision dry for 48 hours. Do not soak in a bathtub, hot tub, or swim until the incision heals completely. You may not shower if you have a hemodialysis catheter. ° °Incision Care ° °Clean your incision with mild soap and water after 48 hours. Pat the area dry with a clean towel. You do not need a bandage unless otherwise instructed. Do not apply any ointments or creams to your incision. You may have skin glue on your incision. Do not peel it off. It will come off on its own in about one week. Your arm may swell a bit after surgery. To reduce swelling use pillows to elevate your arm so it is above your heart. Your doctor will tell you if you need to lightly wrap your arm with an ACE bandage. ° °Diet ° °Resume your normal diet. There are not special food restrictions following this procedure. In order to heal from your surgery, it is CRITICAL to get adequate nutrition. Your body requires vitamins, minerals, and protein. Vegetables are the best source of vitamins and minerals. Vegetables also provide the perfect balance of protein. Processed food has little nutritional value, so try to avoid this. ° °Medications ° °Resume taking all of your medications. If your incision is causing pain, you may take over-the counter pain relievers such as acetaminophen (Tylenol). If you were prescribed a stronger pain medication, please be aware these medications can cause nausea and constipation. Prevent  nausea by taking the medication with a snack or meal. Avoid constipation by drinking plenty of fluids and eating foods with high amount of fiber, such as fruits, vegetables, and grains. Do not take Tylenol if you are taking prescription pain medications. ° ° ° ° °Follow up °Your surgeon may want to see you in the office following your access surgery. If so, this will be arranged at the time of your surgery. ° °Please call us immediately for any of the following conditions: ° °Increased pain, redness, drainage (pus) from your incision site °Fever of 101 degrees or higher °Severe or worsening pain at your incision site °Hand pain or numbness. ° °Reduce your risk of vascular disease: ° °Stop smoking. If you would like help, call [facility] at 1-800-QUIT-NOW (1-800-784-8669) or [place] at 336-586-4000 ° °Manage your cholesterol °Maintain a desired weight °Control your diabetes °Keep your blood pressure down ° °Dialysis ° °It will take several weeks to several months for your new dialysis access to be ready for use. Your surgeon will determine when it is OK to use it. Your nephrologist will continue to direct your dialysis. You can continue to use your Permcath until your new access is ready for use. ° °If you have any questions, please call the office at [phone]. ° °

## 2019-03-03 NOTE — Op Note (Signed)
    Patient name: [name] MRN: [id number] DOB: 1985/07/23 Sex: male  03/03/2019 Pre-operative Diagnosis: Chronic kidney disease Post-operative diagnosis:  Same Surgeon:  Eda Paschal. Donzetta Matters, MD Assistant: Arlee Muslim, PA Procedure Performed:  Left arm brachial artery to cephalic vein AV fistula creation  Indications: 33 year old male with chronic kidney disease.  He is rapidly approaching the need for dialysis.  He is indicated for left arm AV fistula versus graft.  On preoperative mapping appears to have marginal cephalic vein on the left and suitable basilic vein on the left.  He is right-hand dominant.  Findings: Cephalic vein and basilic vein were, trunk just below the antecubitum.  Cephalic vein was at least 3 mm throughout the course in the wound bed was easily spatulated and sewn end-to-side to the brachial artery.  At completion there was some pulsatility in the vein.    Basilic vein is much larger could be used if cephalic vein fistula fails.   Procedure:  The patient was identified in the holding area and taken to the operating room where LMA anesthesia induced.  He was sterilely prepped and draped left upper extremity usual fashion antibiotics were minister and timeout was called.  Ultrasound was used to identify the cephalic and basilic veins both appeared suitable for fistula creation.  There was a common trunk just below the antecubitum.  Transverse incision was made the cephalic vein was dissected out all the way back to the common trunk and more for orientation.  I dissected through the deep fascia identified the brachial artery placed Vesseloops around this.  The vein was then clamped more cephalad transected at the common trunk level basilic vein was left in line.  This was spatulated flushed with heparinized saline.  The artery was clamped distally proximally over longitudinally flushed with heparinized saline by directions.  The vein was then sewn end-to-side with 6-0 Prolene  suture.  Prior to completion anastomosis without flushing all directions.  Upon completion the vein was under some tension.  We freed up soft tissue for several centimeters upon the vein.  We then had good flow by Doppler pulsatility by palpation.  There is a palpable radial pulse at the wrist.  Satisfied with this we irrigated wound pain persisted closed in layers of Vicryl Monocryl.  Dermabond placed to level skin.  He was awakened anesthesia having tolerated procedure without immediate complication.  All counts were correct at completion.  EBL: 20 cc  Bardia Wangerin C. Donzetta Matters, MD [facility] Office: [phone] Pager: [phone]

## 2019-03-03 NOTE — Transfer of Care (Signed)
Immediate Anesthesia Transfer of Care Note  Patient: [name]  Procedure(s) Performed: ARTERIOVENOUS (AV) FISTULA CREATION LEFT ARM (Left Arm Upper)  Patient Location: PACU  Anesthesia Type:General  Level of Consciousness: drowsy and patient cooperative  Airway & Oxygen Therapy: Patient Spontanous Breathing and Patient connected to face mask oxygen  Post-op Assessment: Report given to RN and Post -op Vital signs reviewed and stable  Post vital signs: Reviewed and stable  Last Vitals:  Vitals Value Taken Time  BP    Temp    Pulse 115 03/03/19 1320  Resp 16 03/03/19 1320  SpO2 100 % 03/03/19 1320  Vitals shown include unvalidated device data.  Last Pain:  Vitals:   03/03/19 1106  TempSrc:   PainSc: 0-No pain      Patients Stated Pain Goal: 2 ([id number])  Complications: No apparent anesthesia complications

## 2019-03-03 NOTE — Anesthesia Preprocedure Evaluation (Addendum)
Anesthesia Evaluation  Patient identified by MRN, date of birth, ID band Patient awake    Reviewed: Allergy & Precautions, NPO status , Patient's Chart, lab work & pertinent test results  Airway Mallampati: II  TM Distance: >3 FB     Dental  (+) Dental Advisory Given   Pulmonary former smoker,    breath sounds clear to auscultation       Cardiovascular hypertension,  Rhythm:Regular Rate:Normal     Neuro/Psych Depression Bipolar Disorder negative neurological ROS     GI/Hepatic Neg liver ROS, GERD  ,  Endo/Other  negative endocrine ROS  Renal/GU Renal disease     Musculoskeletal   Abdominal   Peds  Hematology  (+) anemia ,   Anesthesia Other Findings   Reproductive/Obstetrics                             Anesthesia Physical Anesthesia Plan  ASA: III  Anesthesia Plan: General   Post-op Pain Management:    Induction: Intravenous  PONV Risk Score and Plan: 2 and Ondansetron, Treatment may vary due to age or medical condition and Dexamethasone  Airway Management Planned: LMA  Additional Equipment:   Intra-op Plan:   Post-operative Plan: Extubation in OR  Informed Consent: I have reviewed the patients History and Physical, chart, labs and discussed the procedure including the risks, benefits and alternatives for the proposed anesthesia with the patient or authorized representative who has indicated his/her understanding and acceptance.       Plan Discussed with: CRNA  Anesthesia Plan Comments:        Anesthesia Quick Evaluation

## 2019-03-04 ENCOUNTER — Encounter (HOSPITAL_COMMUNITY): Payer: Self-pay | Admitting: Vascular Surgery

## 2019-03-04 NOTE — Anesthesia Postprocedure Evaluation (Signed)
Anesthesia Post Note  Patient: [name]  Procedure(s) Performed: ARTERIOVENOUS (AV) FISTULA CREATION LEFT ARM (Left Arm Upper)     Patient location during evaluation: PACU Anesthesia Type: General Level of consciousness: awake and alert Pain management: pain level controlled Vital Signs Assessment: post-procedure vital signs reviewed and stable Respiratory status: spontaneous breathing, nonlabored ventilation, respiratory function stable and patient connected to nasal cannula oxygen Cardiovascular status: blood pressure returned to baseline and stable Postop Assessment: no apparent nausea or vomiting Anesthetic complications: no    Last Vitals:  Vitals:   03/03/19 1419 03/03/19 1434  BP: (!) 195/97 (!) 159/95  Pulse: (!) 111 (!) 108  Resp: (!) 23 (!) 26  Temp:    SpO2: 100% 96%    Last Pain:  Vitals:   03/03/19 1434  TempSrc:   PainSc: 3                  Tiajuana Amass

## 2019-03-05 ENCOUNTER — Observation Stay (HOSPITAL_COMMUNITY)
Admission: EM | Admit: 2019-03-05 | Discharge: 2019-03-06 | Disposition: A | Discharge status: Home / Self Care | Payer: Medicaid Other | Attending: Vascular Surgery | Admitting: Vascular Surgery

## 2019-03-05 ENCOUNTER — Encounter (HOSPITAL_COMMUNITY): Payer: Self-pay | Admitting: Emergency Medicine

## 2019-03-05 ENCOUNTER — Other Ambulatory Visit: Payer: Self-pay

## 2019-03-05 ENCOUNTER — Emergency Department (HOSPITAL_COMMUNITY)
Admission: EM | Admit: 2019-03-05 | Discharge: 2019-03-05 | Discharge status: Absent without leave | Payer: Medicaid Other | Source: Home / Self Care

## 2019-03-05 DIAGNOSIS — K219 Gastro-esophageal reflux disease without esophagitis: Secondary | ICD-10-CM | POA: Diagnosis not present

## 2019-03-05 DIAGNOSIS — Z87891 Personal history of nicotine dependence: Secondary | ICD-10-CM | POA: Diagnosis not present

## 2019-03-05 DIAGNOSIS — Z886 Allergy status to analgesic agent status: Secondary | ICD-10-CM | POA: Diagnosis not present

## 2019-03-05 DIAGNOSIS — T82590A Other mechanical complication of surgically created arteriovenous fistula, initial encounter: Secondary | ICD-10-CM | POA: Diagnosis not present

## 2019-03-05 DIAGNOSIS — Z79899 Other long term (current) drug therapy: Secondary | ICD-10-CM | POA: Diagnosis not present

## 2019-03-05 DIAGNOSIS — N189 Chronic kidney disease, unspecified: Secondary | ICD-10-CM | POA: Diagnosis present

## 2019-03-05 DIAGNOSIS — Y832 Surgical operation with anastomosis, bypass or graft as the cause of abnormal reaction of the patient, or of later complication, without mention of misadventure at the time of the procedure: Secondary | ICD-10-CM | POA: Diagnosis not present

## 2019-03-05 DIAGNOSIS — F319 Bipolar disorder, unspecified: Secondary | ICD-10-CM | POA: Insufficient documentation

## 2019-03-05 DIAGNOSIS — I120 Hypertensive chronic kidney disease with stage 5 chronic kidney disease or end stage renal disease: Secondary | ICD-10-CM | POA: Insufficient documentation

## 2019-03-05 DIAGNOSIS — N185 Chronic kidney disease, stage 5: Secondary | ICD-10-CM | POA: Insufficient documentation

## 2019-03-05 DIAGNOSIS — Z20828 Contact with and (suspected) exposure to other viral communicable diseases: Secondary | ICD-10-CM | POA: Diagnosis not present

## 2019-03-05 DIAGNOSIS — D631 Anemia in chronic kidney disease: Secondary | ICD-10-CM | POA: Diagnosis present

## 2019-03-05 LAB — CBC WITH DIFFERENTIAL/PLATELET
Abs Immature Granulocytes: 0.01 10*3/uL (ref 0.00–0.07)
Basophils Absolute: 0.1 10*3/uL (ref 0.0–0.1)
Basophils Relative: 1 %
Eosinophils Absolute: 0.3 10*3/uL (ref 0.0–0.5)
Eosinophils Relative: 5 %
HCT: 35.2 % — ABNORMAL LOW (ref 39.0–52.0)
Hemoglobin: 11.7 g/dL — ABNORMAL LOW (ref 13.0–17.0)
Immature Granulocytes: 0 %
Lymphocytes Relative: 30 %
Lymphs Abs: 1.5 10*3/uL (ref 0.7–4.0)
MCH: 31.5 pg (ref 26.0–34.0)
MCHC: 33.2 g/dL (ref 30.0–36.0)
MCV: 94.6 fL (ref 80.0–100.0)
Monocytes Absolute: 0.4 10*3/uL (ref 0.1–1.0)
Monocytes Relative: 7 %
Neutro Abs: 2.7 10*3/uL (ref 1.7–7.7)
Neutrophils Relative %: 57 %
Platelets: 154 10*3/uL (ref 150–400)
RBC: 3.72 MIL/uL — ABNORMAL LOW (ref 4.22–5.81)
RDW: 12.1 % (ref 11.5–15.5)
WBC: 4.9 10*3/uL (ref 4.0–10.5)
nRBC: 0 % (ref 0.0–0.2)

## 2019-03-05 LAB — COMPREHENSIVE METABOLIC PANEL
ALT: 10 U/L (ref 0–44)
AST: 31 U/L (ref 15–41)
Albumin: 4 g/dL (ref 3.5–5.0)
Alkaline Phosphatase: 92 U/L (ref 38–126)
Anion gap: 11 (ref 5–15)
BUN: 52 mg/dL — ABNORMAL HIGH (ref 6–20)
CO2: 18 mmol/L — ABNORMAL LOW (ref 22–32)
Calcium: 9.1 mg/dL (ref 8.9–10.3)
Chloride: 109 mmol/L (ref 98–111)
Creatinine, Ser: 7.94 mg/dL — ABNORMAL HIGH (ref 0.61–1.24)
GFR calc Af Amer: 9 mL/min — ABNORMAL LOW (ref >60)
GFR calc non Af Amer: 8 mL/min — ABNORMAL LOW (ref >60)
Glucose, Bld: 90 mg/dL (ref 70–99)
Potassium: 4.4 mmol/L (ref 3.5–5.1)
Sodium: 138 mmol/L (ref 135–145)
Total Bilirubin: 0.8 mg/dL (ref 0.3–1.2)
Total Protein: 7.1 g/dL (ref 6.5–8.1)

## 2019-03-05 LAB — LACTIC ACID, PLASMA: Lactic Acid, Venous: 0.7 mmol/L (ref 0.5–1.9)

## 2019-03-05 MED ORDER — SODIUM CHLORIDE 0.9 % IV SOLN
1.5000 g | INTRAVENOUS | Status: AC
  Administered 2019-03-06: 1.5 g via INTRAVENOUS
  Filled 2019-03-05 (×2): qty 1.5

## 2019-03-05 MED ORDER — SODIUM CHLORIDE 0.9% FLUSH: 3.0000 mL | Freq: Once | INTRAVENOUS | Status: DC

## 2019-03-05 MED ORDER — CALCITRIOL 0.25 MCG PO CAPS
0.2500 ug | ORAL_CAPSULE | Freq: Every day | ORAL | Status: DC
  Filled 2019-03-05: qty 1

## 2019-03-05 MED ORDER — BENZTROPINE MESYLATE 0.5 MG PO TABS
0.5000 mg | ORAL_TABLET | Freq: Every day | ORAL | Status: DC
  Administered 2019-03-05: 0.5 mg via ORAL
  Filled 2019-03-05 (×2): qty 1

## 2019-03-05 MED ORDER — AMLODIPINE BESYLATE 10 MG PO TABS: 10.0000 mg | ORAL_TABLET | Freq: Every day | ORAL | Status: DC

## 2019-03-05 NOTE — ED Triage Notes (Addendum)
Pt had new AV fistula on 10/22 and was told if swelling did not go down in 24 hours to come back.  Reports swelling, pain, numbness, weakness, and redness to L arm.

## 2019-03-05 NOTE — ED Notes (Signed)
Please spouse Natthan Mclaury @ [phone] a status update--[name]

## 2019-03-05 NOTE — Consult Note (Addendum)
Hospital Consult    Reason for Consult: Left hand weakness and numbness following AV fistula placement Referring Physician: ED MRN #:  [id number]  History of Present Illness: This is a 33 y.o. male with hearing difficulty as well as hypertension and stage V CKD that vascular surgery has been consulted for weakness and numbness in the left hand following AV fistula placement.  Patient had a left arm brachiocephalic AV fistula placed 2 days ago by Dr. Donzetta Matters.  Come to the ED complaining of profound numbness in the left hand.  Also states his hand is weak and he cannot open bottles.  States his hand was completely normal prior to surgery.  This is getting worse.  States this is not tolerable.  Not currently on dialysis at this time.  Past Medical History:  Diagnosis Date  . Bipolar 1 disorder ([place])   . Depression   . GERD (gastroesophageal reflux disease)   . Hearing difficulty of left ear    75% hearing  . 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  . Hypertension   . Renal disorder     Past Surgical History:  Procedure Laterality Date  . APPENDECTOMY    . AV FISTULA PLACEMENT Left 03/03/2019   Procedure: ARTERIOVENOUS (AV) FISTULA CREATION LEFT ARM;  Surgeon: Waynetta Sandy, MD;  Location: [facility];  Service: Vascular;  Laterality: Left;  . spinal tap    . WISDOM TOOTH EXTRACTION      Allergies  Allergen Reactions  . Nsaids Other (See Comments)    "Kidney problems "  . Other Other (See Comments)    Patient stated that he goes to Dr. Clover Mealy for his kidney problems and only takes medication that she gives him.     Prior to Admission medications   Medication Sig Start Date End Date Taking? Authorizing Provider  calcitRIOL (ROCALTROL) 0.25 MCG capsule Take 0.25 mcg by mouth daily.   Yes [provider]  acetaminophen (TYLENOL) 500 MG tablet Take 1,000-1,500 mg by mouth every 6 (six) hours as needed for headache (pain).     [provider]   amLODipine (NORVASC) 5 MG tablet Take 10 mg by mouth daily.     [provider]  benztropine (COGENTIN) 0.5 MG tablet Take 1 tablet (0.5 mg total) by mouth at bedtime. 12/21/18 12/21/19  Arfeen, Arlyce Harman, MD  HYDROcodone-acetaminophen (NORCO) 5-325 MG tablet Take 1 tablet by mouth every 6 (six) hours as needed for moderate pain. 03/03/19   Dagoberto Ligas, PA-C  risperiDONE (RISPERDAL) 1 MG tablet Take 1 tablet (1 mg total) by mouth at bedtime. 12/21/18 12/21/19  Kathlee Nations, MD    Social History   Socioeconomic History  . Marital status: Married    Spouse name: Not on file  . Number of children: Not on file  . Years of education: Not on file  . Highest education level: Not on file  Occupational History  . Not on file  Social Needs  . Financial resource strain: Not on file  . Food insecurity    Worry: Not on file    Inability: Not on file  . Transportation needs    Medical: Not on file    Non-medical: Not on file  Tobacco Use  . Smoking status: Former Smoker    Packs/day: 0.30    Types: Cigarettes    Quit date: 02/27/2016    Years since quitting: 3.0  . Smokeless tobacco: Never Used  Substance and Sexual Activity  .  Alcohol use: Not Currently    Frequency: Never    Comment: rare  . Drug use: No  . Sexual activity: Yes  Lifestyle  . Physical activity    Days per week: Not on file    Minutes per session: Not on file  . Stress: Not on file  Relationships  . Social Herbalist on phone: Not on file    Gets together: Not on file    Attends religious service: Not on file    Active member of club or organization: Not on file    Attends meetings of clubs or organizations: Not on file    Relationship status: Not on file  . Intimate partner violence    Fear of current or ex partner: Not on file    Emotionally abused: Not on file    Physically abused: Not on file    Forced sexual activity: Not on file  Other Topics Concern  . Not on file  Social History  Narrative  . Not on file     Family History  Problem Relation Age of Onset  . Hypertension Mother   . Kidney failure Mother     ROS: [x]  Positive   [ ]  Negative   [ ]  All sytems reviewed and are negative  Cardiovascular: []  chest pain/pressure []  palpitations []  SOB lying flat []  DOE []  pain in legs while walking []  pain in legs at rest []  pain in legs at night []  non-healing ulcers []  hx of DVT []  swelling in legs  Pulmonary: []  productive cough []  asthma/wheezing []  home O2  Neurologic: [x]  weakness in [x]  arms []  legs (left hand) [x]  numbness in [x]  arms []  legs (left hand) []  hx of CVA []  mini stroke [] difficulty speaking or slurred speech []  temporary loss of vision in one eye []  dizziness  Hematologic: []  hx of cancer []  bleeding problems []  problems with blood clotting easily  Endocrine:   []  diabetes []  thyroid disease  GI []  vomiting blood []  blood in stool  GU: []  CKD/renal failure []  HD--[]  M/W/F or []  T/T/S []  burning with urination []  blood in urine  Psychiatric: []  anxiety []  depression  Musculoskeletal: []  arthritis []  joint pain  Integumentary: []  rashes []  ulcers  Constitutional: []  fever []  chills   Physical Examination  Vitals:   03/05/19 1808 03/05/19 2016  BP: (!) 153/97 (!) 165/94  Pulse: 89 (!) 101  Resp: 14   Temp: 98.5 F (36.9 C)   SpO2: 98% 100%   Body mass index is 28.89 kg/m.  General:  WDWN in NAD Gait: Not observed HENT: WNL, normocephalic Pulmonary: normal non-labored breathing, without Rales, rhonchi,  wheezing Cardiac: regular, without  Murmurs, rubs or gallops Abdomen: soft, NT/ND, no masses Skin: without rashes Vascular Exam/Pulses: Left brachiocephalic AV fistula with a good thrill .  Left radial pulses palpable. Very weak left grip strength. Numbness in the first and second fingers as well as the forearm Extremities: without ischemic changes, without Gangrene , without cellulitis;  without open wounds;  Musculoskeletal: no muscle wasting or atrophy  Neurologic: A&O X 3; Weakness and numbness in left hand as noted above   CBC    Component Value Date/Time   WBC 4.9 03/05/2019 1841   RBC 3.72 (L) 03/05/2019 1841   HGB 11.7 (L) 03/05/2019 1841   HCT 35.2 (L) 03/05/2019 1841   PLT 154 03/05/2019 1841   MCV 94.6 03/05/2019 1841   MCH 31.5 03/05/2019 1841  MCHC 33.2 03/05/2019 1841   RDW 12.1 03/05/2019 1841   LYMPHSABS 1.5 03/05/2019 1841   MONOABS 0.4 03/05/2019 1841   EOSABS 0.3 03/05/2019 1841   BASOSABS 0.1 03/05/2019 1841    BMET    Component Value Date/Time   NA 138 03/05/2019 1841   K 4.4 03/05/2019 1841   CL 109 03/05/2019 1841   CO2 18 (L) 03/05/2019 1841   GLUCOSE 90 03/05/2019 1841   BUN 52 (H) 03/05/2019 1841   CREATININE 7.94 (H) 03/05/2019 1841   CALCIUM 9.1 03/05/2019 1841   GFRNONAA 8 (L) 03/05/2019 1841   GFRAA 9 (L) 03/05/2019 1841    COAGS: No results found for: INR, PROTIME   Non-Invasive Vascular Imaging:    None   ASSESSMENT/PLAN: This is a 33 y.o. male that presents with profound weakness and numbness in his left hand following AV fistula placement two days ago by Dr. Donzetta Matters.  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  I have recommended ligation of his left arm AV fistula given his motor and sensory deficit on exam and the fact that he states this is not tolerable.  We have ordered a Covid test.  Please keep him n.p.o. after midnight.  Will admit to observation.  OR tomorrow am after covid test.  Marty Heck, MD [facility] Office: [phone] Pager: [place]

## 2019-03-05 NOTE — ED Provider Notes (Signed)
[facility] EMERGENCY DEPARTMENT Provider Note   CSN: [id number] Arrival date & time: 03/05/19  1802     History   Chief Complaint Chief Complaint  Patient presents with  . arm swelling    HPI [name] is a 33 y.o. male.     HPI  Patient is a 33 year old male with a history of bipolar disorder, depression, GERD, difficulty hearing, hypertension, renal disorder, who presents the emergency department today for evaluation of left upper extremity pain, swelling, numbness/weakness.  Patient had AV fistula placed on 10/22 to the left forearm.  Since then he has had numbness/weakness to the left forearm and hand. He states he did not have these symptoms prior to the surgery.   Past Medical History:  Diagnosis Date  . Bipolar 1 disorder ([place])   . Depression   . GERD (gastroesophageal reflux disease)   . Hearing difficulty of left ear    75% hearing  . 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  . Hypertension   . Renal disorder     Patient Active Problem List   Diagnosis Date Noted  . Dehydration 06/07/2018  . Hypertension 06/07/2018  . CKD (chronic kidney disease), stage IV ([place]) 06/07/2018  . Nausea and vomiting 06/07/2018  . [place] syndrome 07/14/2017  . GERD (gastroesophageal reflux disease) 07/14/2017  . History of appendectomy 07/14/2017  . Obesity (BMI 30.0-34.9) 07/14/2017  . Pre-transplant evaluation for kidney transplant 07/14/2017  . Secondary hyperparathyroidism ([place]) 07/14/2017  . Tobacco use 07/14/2017  . Unspecified hypertensive kidney disease with chronic kidney disease stage I through stage IV, or unspecified(403.90) 07/14/2017  . Bipolar 1 disorder ([place]) 09/01/2013  . 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 APPENDECTOMY    . AV FISTULA PLACEMENT Left 03/03/2019   Procedure: ARTERIOVENOUS (AV) FISTULA CREATION LEFT ARM;  Surgeon: Waynetta Sandy, MD;   Location: [facility];  Service: Vascular;  Laterality: Left;  . spinal tap    . WISDOM TOOTH EXTRACTION          Home Medications    Prior to Admission medications   Medication Sig Start Date End Date Taking? Authorizing Provider  amLODipine (NORVASC) 5 MG tablet Take 10 mg by mouth daily.    Yes [provider]  calcitRIOL (ROCALTROL) 0.25 MCG capsule Take 0.25 mcg by mouth daily.   Yes [provider]  HYDROcodone-acetaminophen (NORCO) 5-325 MG tablet Take 1 tablet by mouth every 6 (six) hours as needed for moderate pain. 03/03/19  Yes Dagoberto Ligas, PA-C  benztropine (COGENTIN) 0.5 MG tablet Take 1 tablet (0.5 mg total) by mouth at bedtime. Patient not taking: Reported on 03/05/2019 12/21/18 12/21/19  Arfeen, Arlyce Harman, MD  risperiDONE (RISPERDAL) 1 MG tablet Take 1 tablet (1 mg total) by mouth at bedtime. Patient not taking: Reported on 03/05/2019 12/21/18 12/21/19  Kathlee Nations, MD    Family History Family History  Problem Relation Age of Onset  . Hypertension Mother   . Kidney failure Mother     Social History Social History   Tobacco Use  . Smoking status: Former Smoker    Packs/day: 0.30    Types: Cigarettes    Quit date: 02/27/2016    Years since quitting: 3.0  . Smokeless tobacco: Never Used  Substance Use Topics  . Alcohol use: Not Currently    Frequency: Never    Comment: rare  . Drug use: No  Allergies   Nsaids and Other   Review of Systems Review of Systems  Constitutional: Negative for fever.  HENT: Negative for ear pain and sore throat.   Eyes: Negative for visual disturbance.  Respiratory: Negative for cough and shortness of breath.   Cardiovascular: Negative for chest pain.  Gastrointestinal: Negative for abdominal pain, constipation, nausea and vomiting.  Genitourinary: Negative for dysuria.  Musculoskeletal:       LUE pain  Skin: Negative for rash.  Neurological: Negative for weakness and numbness.  All other systems  reviewed and are negative.    Physical Exam Updated Vital Signs BP (!) 149/89 (BP Location: Right Arm)   Pulse 88   Temp 98.1 F (36.7 C) (Oral)   Resp 16   Ht 5\' 8"  (1.727 m)   Wt 86.2 kg   SpO2 100%   BMI 28.89 kg/m   Physical Exam Vitals signs and nursing note reviewed.  Constitutional:      Appearance: He is well-developed.  HENT:     Head: Normocephalic and atraumatic.  Eyes:     Conjunctiva/sclera: Conjunctivae normal.  Neck:     Musculoskeletal: Neck supple.  Cardiovascular:     Rate and Rhythm: Normal rate and regular rhythm.     Heart sounds: No murmur.  Pulmonary:     Effort: Pulmonary effort is normal. No respiratory distress.     Breath sounds: Normal breath sounds.  Abdominal:     Palpations: Abdomen is soft.     Tenderness: There is no abdominal tenderness.  Musculoskeletal:     Comments: LUE has some erythema to the ventral aspect of the arm. There is some warmth present. The surgical wound site is well healing. Pt has normal radial pulse.   Skin:    General: Skin is warm and dry.  Neurological:     Mental Status: [name].     Comments: Grip strength of the LUE is weak compared to the right. Decreased sensation noted to the LUE.      ED Treatments / Results  Labs (all labs ordered are listed, but only abnormal results are displayed) Labs Reviewed  COMPREHENSIVE METABOLIC PANEL - Abnormal; Notable for the following components:      Result Value   CO2 18 (*)    BUN 52 (*)    Creatinine, Ser 7.94 (*)    GFR calc non Af Amer 8 (*)    GFR calc Af Amer 9 (*)    All other components within normal limits  CBC WITH DIFFERENTIAL/PLATELET - Abnormal; Notable for the following components:   RBC 3.72 (*)    Hemoglobin 11.7 (*)    HCT 35.2 (*)    All other components within normal limits  SARS CORONAVIRUS 2 (TAT 6-24 HRS)  LACTIC ACID, PLASMA  LACTIC ACID, PLASMA    EKG None  Radiology No results found.  Procedures Procedures (including  critical care time)  Medications Ordered in ED Medications  sodium chloride flush (NS) 0.9 % injection 3 mL (has no administration in time range)  cefUROXime (ZINACEF) 1.5 g in sodium chloride 0.9 % 100 mL IVPB (has no administration in time range)  amLODipine (NORVASC) tablet 10 mg (has no administration in time range)  benztropine (COGENTIN) tablet 0.5 mg (0.5 mg Oral Given 03/05/19 2340)  calcitRIOL (ROCALTROL) capsule 0.25 mcg (has no administration in time range)     Initial Impression / Assessment and Plan / ED Course  I have reviewed the triage vital signs and  the nursing notes.  Pertinent labs & imaging results that were available during my care of the patient were reviewed by me and considered in my medical decision making (see chart for details).     Final Clinical Impressions(s) / ED Diagnoses   Final diagnoses:  Mechanical complication of arteriovenous fistula surgically created, initial encounter [facility])   Patient is a 33 year old male with a history of bipolar disorder, depression, GERD, difficulty hearing, hypertension, renal disorder, who presents the emergency department today for evaluation of left upper extremity pain, swelling, numbness/weakness.  Patient had AV fistula placed on 10/22 to the left forearm.  Since then he has had numbness/weakness to the left forearm and hand. He states he did not have these symptoms prior to the surgery.   VS WNL. Pt does have radial pulse and seems to have good perfusion but he does have weakness and sensory changes on exam.   Pt evaluated at bedside by Dr. Carlis [name] with vascular surgery who recommends taking pt back to the OR in the AM. Pt will be admitted to the vascular surgery service.   ED Discharge Orders    None       Bishop Dublin 03/05/19 2351    Blanchie Dessert, MD 03/06/19 2332

## 2019-03-06 ENCOUNTER — Observation Stay (HOSPITAL_COMMUNITY): Payer: Medicaid Other | Admitting: Certified Registered Nurse Anesthetist

## 2019-03-06 ENCOUNTER — Encounter (HOSPITAL_COMMUNITY): Payer: Self-pay | Admitting: Certified Registered Nurse Anesthetist

## 2019-03-06 ENCOUNTER — Encounter (HOSPITAL_COMMUNITY)
Admission: EM | Disposition: A | Discharge status: Home / Self Care | Payer: Self-pay | Source: Home / Self Care | Attending: Emergency Medicine

## 2019-03-06 DIAGNOSIS — N184 Chronic kidney disease, stage 4 (severe): Secondary | ICD-10-CM | POA: Diagnosis not present

## 2019-03-06 DIAGNOSIS — Z20828 Contact with and (suspected) exposure to other viral communicable diseases: Secondary | ICD-10-CM | POA: Diagnosis not present

## 2019-03-06 DIAGNOSIS — T82590A Other mechanical complication of surgically created arteriovenous fistula, initial encounter: Secondary | ICD-10-CM | POA: Diagnosis not present

## 2019-03-06 DIAGNOSIS — D631 Anemia in chronic kidney disease: Secondary | ICD-10-CM | POA: Diagnosis present

## 2019-03-06 DIAGNOSIS — N189 Chronic kidney disease, unspecified: Secondary | ICD-10-CM | POA: Diagnosis present

## 2019-03-06 DIAGNOSIS — T82898A Other specified complication of vascular prosthetic devices, implants and grafts, initial encounter: Secondary | ICD-10-CM | POA: Diagnosis not present

## 2019-03-06 DIAGNOSIS — F319 Bipolar disorder, unspecified: Secondary | ICD-10-CM | POA: Diagnosis not present

## 2019-03-06 DIAGNOSIS — K219 Gastro-esophageal reflux disease without esophagitis: Secondary | ICD-10-CM | POA: Diagnosis not present

## 2019-03-06 HISTORY — PX: LIGATION OF ARTERIOVENOUS  FISTULA: SHX5948

## 2019-03-06 LAB — CBC
HCT: 31.7 % — ABNORMAL LOW (ref 39.0–52.0)
Hemoglobin: 10.8 g/dL — ABNORMAL LOW (ref 13.0–17.0)
MCH: 32 pg (ref 26.0–34.0)
MCHC: 34.1 g/dL (ref 30.0–36.0)
MCV: 93.8 fL (ref 80.0–100.0)
Platelets: 137 10*3/uL — ABNORMAL LOW (ref 150–400)
RBC: 3.38 MIL/uL — ABNORMAL LOW (ref 4.22–5.81)
RDW: 12.1 % (ref 11.5–15.5)
WBC: 4.5 10*3/uL (ref 4.0–10.5)
nRBC: 0 % (ref 0.0–0.2)

## 2019-03-06 LAB — CREATININE, SERUM
Creatinine, Ser: 7.7 mg/dL — ABNORMAL HIGH (ref 0.61–1.24)
GFR calc Af Amer: 10 mL/min — ABNORMAL LOW (ref >60)
GFR calc non Af Amer: 8 mL/min — ABNORMAL LOW (ref >60)

## 2019-03-06 LAB — HIV ANTIBODY (ROUTINE TESTING W REFLEX): HIV Screen 4th Generation wRfx: NONREACTIVE

## 2019-03-06 LAB — LACTIC ACID, PLASMA: Lactic Acid, Venous: 1 mmol/L (ref 0.5–1.9)

## 2019-03-06 LAB — SARS CORONAVIRUS 2 (TAT 6-24 HRS): SARS Coronavirus 2: NEGATIVE

## 2019-03-06 SURGERY — LIGATION OF ARTERIOVENOUS  FISTULA
Anesthesia: Choice | Laterality: Left

## 2019-03-06 MED ORDER — MIDAZOLAM HCL 5 MG/5ML IJ SOLN
INTRAMUSCULAR | Status: DC | PRN
  Administered 2019-03-06: 2 mg via INTRAVENOUS

## 2019-03-06 MED ORDER — LIDOCAINE HCL (PF) 1 % IJ SOLN
INTRAMUSCULAR | Status: AC
  Filled 2019-03-06: qty 30

## 2019-03-06 MED ORDER — PROPOFOL 500 MG/50ML IV EMUL
INTRAVENOUS | Status: DC | PRN
  Administered 2019-03-06: 125 ug/kg/min via INTRAVENOUS

## 2019-03-06 MED ORDER — ACETAMINOPHEN 500 MG PO TABS: 1000.0000 mg | ORAL_TABLET | Freq: Once | ORAL | Status: DC | PRN

## 2019-03-06 MED ORDER — ONDANSETRON HCL 4 MG/2ML IJ SOLN: 4.0000 mg | Freq: Four times a day (QID) | INTRAMUSCULAR | Status: DC | PRN

## 2019-03-06 MED ORDER — SODIUM CHLORIDE 0.9 % IV SOLN
250.0000 mL | INTRAVENOUS | Status: DC | PRN
  Administered 2019-03-06: 250 mL via INTRAVENOUS

## 2019-03-06 MED ORDER — OXYCODONE HCL 5 MG/5ML PO SOLN: 5.0000 mg | Freq: Once | ORAL | Status: DC | PRN

## 2019-03-06 MED ORDER — ALUM & MAG HYDROXIDE-SIMETH 200-200-20 MG/5ML PO SUSP: 15.0000 mL | ORAL | Status: DC | PRN

## 2019-03-06 MED ORDER — ACETAMINOPHEN 160 MG/5ML PO SOLN: 1000.0000 mg | Freq: Once | ORAL | Status: DC | PRN

## 2019-03-06 MED ORDER — PHENOL 1.4 % MT LIQD: 1.0000 | OROMUCOSAL | Status: DC | PRN

## 2019-03-06 MED ORDER — ACETAMINOPHEN 325 MG PO TABS: 325.0000 mg | ORAL_TABLET | ORAL | Status: DC | PRN

## 2019-03-06 MED ORDER — GUAIFENESIN-DM 100-10 MG/5ML PO SYRP: 15.0000 mL | ORAL_SOLUTION | ORAL | Status: DC | PRN

## 2019-03-06 MED ORDER — OXYCODONE HCL 5 MG PO TABS: 5.0000 mg | ORAL_TABLET | ORAL | Status: DC | PRN

## 2019-03-06 MED ORDER — FENTANYL CITRATE (PF) 250 MCG/5ML IJ SOLN
INTRAMUSCULAR | Status: AC
  Filled 2019-03-06: qty 5

## 2019-03-06 MED ORDER — DOCUSATE SODIUM 100 MG PO CAPS: 100.0000 mg | ORAL_CAPSULE | Freq: Two times a day (BID) | ORAL | Status: DC

## 2019-03-06 MED ORDER — HYDROCODONE-ACETAMINOPHEN 5-325 MG PO TABS: 1.0000 | ORAL_TABLET | Freq: Four times a day (QID) | ORAL | 0 refills | Status: DC | PRN

## 2019-03-06 MED ORDER — SENNOSIDES-DOCUSATE SODIUM 8.6-50 MG PO TABS: 1.0000 | ORAL_TABLET | Freq: Every evening | ORAL | Status: DC | PRN

## 2019-03-06 MED ORDER — FENTANYL CITRATE (PF) 100 MCG/2ML IJ SOLN
INTRAMUSCULAR | Status: DC | PRN
  Administered 2019-03-06: 100 ug via INTRAVENOUS

## 2019-03-06 MED ORDER — FENTANYL CITRATE (PF) 100 MCG/2ML IJ SOLN: 25.0000 ug | INTRAMUSCULAR | Status: DC | PRN

## 2019-03-06 MED ORDER — 0.9 % SODIUM CHLORIDE (POUR BTL) OPTIME
TOPICAL | Status: DC | PRN
  Administered 2019-03-06: 1000 mL

## 2019-03-06 MED ORDER — PANTOPRAZOLE SODIUM 40 MG PO TBEC: 40.0000 mg | DELAYED_RELEASE_TABLET | Freq: Every day | ORAL | Status: DC

## 2019-03-06 MED ORDER — BISACODYL 5 MG PO TBEC: 5.0000 mg | DELAYED_RELEASE_TABLET | Freq: Every day | ORAL | Status: DC | PRN

## 2019-03-06 MED ORDER — ACETAMINOPHEN 10 MG/ML IV SOLN: 1000.0000 mg | Freq: Once | INTRAVENOUS | Status: DC | PRN

## 2019-03-06 MED ORDER — PROPOFOL 10 MG/ML IV BOLUS
INTRAVENOUS | Status: AC
  Filled 2019-03-06: qty 20

## 2019-03-06 MED ORDER — SODIUM CHLORIDE 0.9 % IV SOLN
INTRAVENOUS | Status: AC
  Filled 2019-03-06: qty 1.2

## 2019-03-06 MED ORDER — HEPARIN SODIUM (PORCINE) 5000 UNIT/ML IJ SOLN: 5000.0000 [IU] | Freq: Three times a day (TID) | INTRAMUSCULAR | Status: DC

## 2019-03-06 MED ORDER — LABETALOL HCL 5 MG/ML IV SOLN: 10.0000 mg | INTRAVENOUS | Status: DC | PRN

## 2019-03-06 MED ORDER — POTASSIUM CHLORIDE CRYS ER 20 MEQ PO TBCR: 20.0000 meq | EXTENDED_RELEASE_TABLET | Freq: Once | ORAL | Status: DC

## 2019-03-06 MED ORDER — LIDOCAINE HCL 1 % IJ SOLN
INTRAMUSCULAR | Status: DC | PRN
  Administered 2019-03-06: 30 mL

## 2019-03-06 MED ORDER — MIDAZOLAM HCL 2 MG/2ML IJ SOLN
INTRAMUSCULAR | Status: AC
  Filled 2019-03-06: qty 2

## 2019-03-06 MED ORDER — SODIUM CHLORIDE 0.9% FLUSH: 3.0000 mL | INTRAVENOUS | Status: DC | PRN

## 2019-03-06 MED ORDER — HYDRALAZINE HCL 20 MG/ML IJ SOLN: 5.0000 mg | INTRAMUSCULAR | Status: DC | PRN

## 2019-03-06 MED ORDER — HYDROMORPHONE HCL 1 MG/ML IJ SOLN: 0.5000 mg | INTRAMUSCULAR | Status: DC | PRN

## 2019-03-06 MED ORDER — METOPROLOL TARTRATE 5 MG/5ML IV SOLN: 2.0000 mg | INTRAVENOUS | Status: DC | PRN

## 2019-03-06 MED ORDER — SODIUM CHLORIDE 0.9% FLUSH: 3.0000 mL | Freq: Two times a day (BID) | INTRAVENOUS | Status: DC

## 2019-03-06 MED ORDER — ACETAMINOPHEN 325 MG RE SUPP: 325.0000 mg | RECTAL | Status: DC | PRN

## 2019-03-06 MED ORDER — OXYCODONE HCL 5 MG PO TABS: 5.0000 mg | ORAL_TABLET | Freq: Once | ORAL | Status: DC | PRN

## 2019-03-06 MED ORDER — ONDANSETRON HCL 4 MG/2ML IJ SOLN
INTRAMUSCULAR | Status: DC | PRN
  Administered 2019-03-06: 4 mg via INTRAVENOUS

## 2019-03-06 SURGICAL SUPPLY — 34 items
ADH SKN CLS APL DERMABOND .7 (GAUZE/BANDAGES/DRESSINGS) ×1
AGENT HMST SPONGE THK3/8 (HEMOSTASIS)
ARMBAND PINK RESTRICT EXTREMIT (MISCELLANEOUS) ×3 IMPLANT
CANISTER SUCT 3000ML PPV (MISCELLANEOUS) ×3 IMPLANT
COVER PROBE W GEL 5X96 (DRAPES) ×1 IMPLANT
COVER WAND RF STERILE (DRAPES) ×3 IMPLANT
DERMABOND ADVANCED (GAUZE/BANDAGES/DRESSINGS) ×2
DERMABOND ADVANCED .7 DNX12 (GAUZE/BANDAGES/DRESSINGS) ×1 IMPLANT
ELECT REM PT RETURN 9FT ADLT (ELECTROSURGICAL) ×3
ELECTRODE REM PT RTRN 9FT ADLT (ELECTROSURGICAL) ×1 IMPLANT
GAUZE SPONGE 4X4 12PLY STRL (GAUZE/BANDAGES/DRESSINGS) ×1 IMPLANT
GLOVE BIO SURGEON STRL SZ7.5 (GLOVE) ×3 IMPLANT
GLOVE BIOGEL PI IND STRL 8 (GLOVE) ×1 IMPLANT
GLOVE BIOGEL PI INDICATOR 8 (GLOVE) ×2
GOWN STRL REUS W/ TWL LRG LVL3 (GOWN DISPOSABLE) ×2 IMPLANT
GOWN STRL REUS W/ TWL XL LVL3 (GOWN DISPOSABLE) ×2 IMPLANT
GOWN STRL REUS W/TWL LRG LVL3 (GOWN DISPOSABLE) ×3
GOWN STRL REUS W/TWL XL LVL3 (GOWN DISPOSABLE) ×3
HEMOSTAT SPONGE AVITENE ULTRA (HEMOSTASIS) IMPLANT
KIT BASIN OR (CUSTOM PROCEDURE TRAY) ×3 IMPLANT
KIT TURNOVER KIT B (KITS) ×3 IMPLANT
NS IRRIG 1000ML POUR BTL (IV SOLUTION) ×3 IMPLANT
PACK CV ACCESS (CUSTOM PROCEDURE TRAY) ×3 IMPLANT
PAD ARMBOARD 7.5X6 YLW CONV (MISCELLANEOUS) ×6 IMPLANT
SUT ETHILON 3 0 PS 1 (SUTURE) IMPLANT
SUT MNCRL AB 4-0 PS2 18 (SUTURE) ×6 IMPLANT
SUT PROLENE 5 0 C 1 24 (SUTURE) ×2 IMPLANT
SUT PROLENE 6 0 BV (SUTURE) IMPLANT
SUT SILK 0 TIES 10X30 (SUTURE) ×3 IMPLANT
SUT VIC AB 3-0 SH 27 (SUTURE) ×3
SUT VIC AB 3-0 SH 27X BRD (SUTURE) ×1 IMPLANT
TOWEL GREEN STERILE (TOWEL DISPOSABLE) ×3 IMPLANT
UNDERPAD 30X30 (UNDERPADS AND DIAPERS) ×3 IMPLANT
WATER STERILE IRR 1000ML POUR (IV SOLUTION) ×3 IMPLANT

## 2019-03-06 NOTE — Anesthesia Procedure Notes (Signed)
Procedure Name: MAC Date/Time: 03/06/2019 8:13 AM Performed by: Candis Shine, CRNA Pre-anesthesia Checklist: Patient identified, Emergency Drugs available, Suction available, Patient being monitored and Timeout performed Patient Re-evaluated:Patient Re-evaluated prior to induction Oxygen Delivery Method: Simple face mask Dental Injury: Teeth and Oropharynx as per pre-operative assessment

## 2019-03-06 NOTE — Progress Notes (Signed)
[facility] [place]  Subjective  - Ongoing left hand numbness and weakness since fistula placed Thursday.   Objective 140/84 80 97.9 F (36.6 C) (Oral) 13 100%  Intake/Output Summary (Last 24 hours) at 03/06/2019 0736 Last data filed at 03/06/2019 0631 Gross per 24 hour  Intake -  Output 1000 ml  Net -1000 ml    Left brachiocephalic AVF with thrill Palpable radial pulse at wrist Numbness in hand, weak grip strength  Laboratory Lab Results: Recent Labs    03/05/19 1841 03/06/19 0043  WBC 4.9 4.5  HGB 11.7* 10.8*  HCT 35.2* 31.7*  PLT 154 137*   BMET Recent Labs    03/03/19 1059 03/05/19 1841 03/06/19 0043  NA 141 138  --   K 5.1 4.4  --   CL 112* 109  --   CO2  --  18*  --   GLUCOSE 86 90  --   BUN 53* 52*  --   CREATININE 7.80* 7.94* 7.70*  CALCIUM  --  9.1  --     COAG No results found for: INR, PROTIME No results found for: PTT  Assessment/Planning:  33 y.o. male that presents with profound weakness and numbness in his left hand following AV fistula placement on Thursday by Dr. Donzetta Matters.  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  I have recommended ligation of his left arm AV fistula.  Marty Heck 03/06/2019 7:36 AM --

## 2019-03-06 NOTE — Progress Notes (Signed)
I gave the patient's spouse an update per patient request.

## 2019-03-06 NOTE — Transfer of Care (Signed)
Immediate Anesthesia Transfer of Care Note  Patient: [name]  Procedure(s) Performed: LIGATION OF ARTERIOVENOUS  FISTULA (Left )  Patient Location: PACU  Anesthesia Type:MAC  Level of Consciousness: drowsy  Airway & Oxygen Therapy: Patient Spontanous Breathing and Patient connected to face mask oxygen  Post-op Assessment: Report given to RN and Post -op Vital signs reviewed and stable  Post vital signs: Reviewed and stable  Last Vitals:  Vitals Value Taken Time  BP 118/71 03/06/19 0901  Temp    Pulse 79 03/06/19 0903  Resp 17 03/06/19 0903  SpO2 100 % 03/06/19 0903  Vitals shown include unvalidated device data.  Last Pain:  Vitals:   03/06/19 0804  TempSrc:   PainSc: 4       Patients Stated Pain Goal: 2 (03/13/10 1735)  Complications: No apparent anesthesia complications

## 2019-03-06 NOTE — Anesthesia Preprocedure Evaluation (Signed)
Anesthesia Evaluation  Patient identified by MRN, date of birth, ID band Patient awake    Reviewed: Allergy & Precautions, NPO status , Patient's Chart, lab work & pertinent test results  History of Anesthesia Complications Negative for: history of anesthetic complications  Airway Mallampati: I  TM Distance: >3 FB Neck ROM: Full    Dental  (+) Dental Advisory Given, Teeth Intact   Pulmonary neg recent URI, former smoker,    breath sounds clear to auscultation       Cardiovascular hypertension, Pt. on medications  Rhythm:Regular     Neuro/Psych PSYCHIATRIC DISORDERS Depression Bipolar Disorder    GI/Hepatic GERD  ,  Endo/Other  negative endocrine ROS  Renal/GU ESRFRenal disease     Musculoskeletal   Abdominal   Peds  Hematology  (+) Blood dyscrasia, anemia ,   Anesthesia Other Findings   Reproductive/Obstetrics                             Anesthesia Physical Anesthesia Plan  ASA: III  Anesthesia Plan: MAC   Post-op Pain Management:    Induction: Intravenous  PONV Risk Score and Plan: 1 and Treatment may vary due to age or medical condition and Propofol infusion  Airway Management Planned: Nasal Cannula  Additional Equipment: None  Intra-op Plan:   Post-operative Plan:   Informed Consent: I have reviewed the patients History and Physical, chart, labs and discussed the procedure including the risks, benefits and alternatives for the proposed anesthesia with the patient or authorized representative who has indicated his/her understanding and acceptance.     Dental advisory given  Plan Discussed with: CRNA and Surgeon  Anesthesia Plan Comments:         Anesthesia Quick Evaluation

## 2019-03-06 NOTE — ED Notes (Signed)
Ordered breakfast--[name] 

## 2019-03-06 NOTE — Op Note (Signed)
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 Marty Heck, MD  Assistant: OR staff  Indication: Patient is a 33 year old male who underwent placement of a left arm brachiocephalic AV fistula 2 days ago with Dr. Donzetta Matters.  This is in the setting of stage V CKD.  He presented to the ED last night complaining of numbness and weakness in his left hand that has been progressive since fistula placement.  He stated his symptoms were not tolerable.  He presents today for fistula ligation and this was not felt to be salvageable given acute onset of his symptoms with progression.  Findings: Very nice cephalic vein with excellent thrill.  Ultimately this was ligated with 2-0 silk ties and divided just beyond the arterial anastomosis.  I did oversew the stump of the vein on the arterial side with a 5-0 Prolene.  He had excellent radial and ulnar pulse palpable at the wrist upon completion.  Anesthesia: MAC  Details: Patient was taken to the operating room after informed consent was obtained.  Placed on operative table in supine position.  His left arm was prepped and draped in usual sterile fashion.  Preop timeout was performed identify patient, procedure and site.  Antibiotics were given.  Initially used 1% lidocaine without epinephrine injected 10 cc into the previous surgical incision just below the left antecubitum.  Ultimately I cutdown on this with 11 blade scalpel and opened the old incision.  Metzenbaum scissors were used to open the subcutaneous Vicryl sutures.  The fistula was then identified and I got a right angle clamp around this and used several 2-0 nylons and the fistula was subsequently ligated and divided between the sutures.  I then oversewed the proximal  stump of the fistula on the arterial anastomosis side with a 5-0 Prolene running suture.  Patient had an excellent radial and ulnar pulse at the wrist.  The wound was copiously irrigated with saline till the effluent was clear.  I ran the subcutaneous tissue closed with 3-0 Vicryl.  Skin was closed with 4-0 Monocryl.  Dermabond was applied.  Taken to PACU in stable condition.  Complication: None  Condition: Stable  Marty Heck, MD [facility] Office: [phone] Pager: [place]

## 2019-03-06 NOTE — ED Notes (Signed)
Pt continues to take monitoring equipment off. 

## 2019-03-06 NOTE — Progress Notes (Signed)
Pt was discharged today with wife to home.  Pt's IV was removed and pt taken off telemetry.  Pt left with all of their personal belongings.  AVS documentation was reviewed with the patient and spouse and all questions were answered.

## 2019-03-06 NOTE — Anesthesia Postprocedure Evaluation (Signed)
Anesthesia Post Note  Patient: [name]  Procedure(s) Performed: LIGATION OF ARTERIOVENOUS  FISTULA (Left )     Patient location during evaluation: PACU Anesthesia Type: MAC Level of consciousness: awake and alert Pain management: pain level controlled Vital Signs Assessment: post-procedure vital signs reviewed and stable Respiratory status: spontaneous breathing, nonlabored ventilation, respiratory function stable and patient connected to nasal cannula oxygen Cardiovascular status: stable and blood pressure returned to baseline Postop Assessment: no apparent nausea or vomiting Anesthetic complications: no    Last Vitals:  Vitals:   03/06/19 1427 03/06/19 1430  BP:    Pulse: 61 79  Resp: 14 16  Temp:    SpO2: 100% 99%    Last Pain:  Vitals:   03/06/19 1032  TempSrc: Oral  PainSc: 8                  Armaan Pond

## 2019-03-07 ENCOUNTER — Encounter (HOSPITAL_COMMUNITY): Payer: Self-pay | Admitting: Vascular Surgery

## 2019-03-10 NOTE — Discharge Summary (Signed)
[facility] Discharge Summary   Patient ID:  [name] MRN: [id number] DOB/AGE: 1986/01/19 33 y.o.  Admit date: 03/05/2019 Discharge date: 03/06/2019 Date of Surgery: 03/06/2019 Surgeon: Surgeon(s): Marty Heck, MD  Admission Diagnosis: Mechanical complication of arteriovenous fistula surgically created, initial encounter [facility]) [T82.590A]  Discharge Diagnoses:  Mechanical complication of arteriovenous fistula surgically created, initial encounter [facility]) [T82.590A]  Secondary Diagnoses: Past Medical History:  Diagnosis Date  . Bipolar 1 disorder ([place])   . Depression   . GERD (gastroesophageal reflux disease)   . Hearing difficulty of left ear    75% hearing  . 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  . Hypertension   . Renal disorder     Procedure(s): LIGATION OF ARTERIOVENOUS  FISTULA  Discharged Condition: stable  HPI: This is a 33 y.o. male with hearing difficulty as well as hypertension and stage V CKD that vascular surgery has been consulted for weakness and numbness in the left hand following AV fistula placement.  Patient had a left arm brachiocephalic AV fistula placed 2 days ago by Dr. Donzetta Matters.  Come to the ED complaining of profound numbness in the left hand.  Also states his hand is weak and he cannot open bottles.  States his hand was completely normal prior to surgery.  This is getting worse.  States this is not tolerable.  Not currently on dialysis at this time.  He was scheduled for ligation of the fistula for intolerable steal symptoms.     Hospital Course:  [name] is a 33 y.o. male is S/P Left Procedure(s): LIGATION OF ARTERIOVENOUS  FISTULA Post op stable condition.  He will be discharged home and f/u in our office for new access plan.     Significant Diagnostic Studies: CBC Lab Results  Component Value Date   WBC 4.5 03/06/2019   HGB 10.8 (L) 03/06/2019   HCT 31.7 (L) 03/06/2019   MCV 93.8 03/06/2019   PLT 137 (L) 03/06/2019    BMET    Component Value Date/Time   NA 138 03/05/2019 1841   K 4.4 03/05/2019 1841   CL 109 03/05/2019 1841   CO2 18 (L) 03/05/2019 1841   GLUCOSE 90 03/05/2019 1841   BUN 52 (H) 03/05/2019 1841   CREATININE 7.70 (H) 03/06/2019 0043   CALCIUM 9.1 03/05/2019 1841   GFRNONAA 8 (L) 03/06/2019 0043   GFRAA 10 (L) 03/06/2019 0043   COAG No results found for: INR, PROTIME   Disposition:  Discharge to :Home Discharge Instructions    Call MD for:  redness, tenderness, or signs of infection (pain, swelling, bleeding, redness, odor or green/yellow discharge around incision site)   Complete by: As directed    Call MD for:  severe or increased pain, loss or decreased feeling  in affected limb(s)   Complete by: As directed    Call MD for:  temperature >100.5   Complete by: As directed    Discharge instructions   Complete by: As directed    You may wash your arm in 24 hours.   Discharge patient   Complete by: As directed    Discharge disposition: 01-Home or Self Care   Discharge patient date: 03/06/2019   Resume previous diet   Complete by: As directed      Allergies as of 03/06/2019      Reactions   Nsaids Other (See Comments)   "Kidney problems "   Other Other (See Comments)   Patient  stated that he goes to Dr. Clover Mealy for his kidney problems and only takes medication that she gives him.       Medication List    TAKE these medications   amLODipine 5 MG tablet Commonly known as: NORVASC Take 10 mg by mouth daily.   benztropine 0.5 MG tablet Commonly known as: COGENTIN Take 1 tablet (0.5 mg total) by mouth at bedtime.   calcitRIOL 0.25 MCG capsule Commonly known as: ROCALTROL Take 0.25 mcg by mouth daily.   HYDROcodone-acetaminophen 5-325 MG tablet Commonly known as: Norco Take 1 tablet by mouth every 6 (six) hours as needed for moderate pain.   risperiDONE 1 MG tablet Commonly known as: RisperDAL Take 1 tablet (1 mg total) by mouth  at bedtime.      Verbal and written Discharge instructions given to the patient. Wound care per Discharge AVS Follow-up Information    Waynetta Sandy, MD In 2 weeks.   Specialties: Vascular Surgery, Cardiology Why: Office will call you to arrange your appt (sent) Contact information: [place] [address] [phone]           Signed: Roxy Horseman 03/10/2019, 10:38 AM

## 2019-03-25 ENCOUNTER — Ambulatory Visit (INDEPENDENT_AMBULATORY_CARE_PROVIDER_SITE_OTHER): Payer: Self-pay | Admitting: Physician Assistant

## 2019-03-25 ENCOUNTER — Other Ambulatory Visit: Payer: Self-pay

## 2019-03-25 VITALS — BP 137/82 | HR 66 | Temp 97.8°F | Resp 16 | Ht 68.0 in | Wt 189.0 lb

## 2019-03-25 DIAGNOSIS — N184 Chronic kidney disease, stage 4 (severe): Secondary | ICD-10-CM

## 2019-03-25 NOTE — Progress Notes (Signed)
POST OPERATIVE OFFICE NOTE    CC:  F/u for surgery  HPI:  This is a 33 y.o. male who is s/p Ligation of left brachiocephalic AV fistula.  He had symptoms of steal.  He reported numbness and weakness in his left hand that has been progressive since fistula placement.  He stated his symptoms were not tolerable.   He states his numbness and weakness seem to be slowly improving.    Allergies  Allergen Reactions  . Nsaids Other (See Comments)    "Kidney problems "  . Other Other (See Comments)    Patient stated that he goes to Dr. Clover Mealy for his kidney problems and only takes medication that she gives him.     Current Outpatient Medications  Medication Sig Dispense Refill  . amLODipine (NORVASC) 5 MG tablet Take 10 mg by mouth daily.     . benztropine (COGENTIN) 0.5 MG tablet Take 1 tablet (0.5 mg total) by mouth at bedtime. 30 tablet 0  . calcitRIOL (ROCALTROL) 0.25 MCG capsule Take 0.25 mcg by mouth daily.    Marland Kitchen HYDROcodone-acetaminophen (NORCO) 5-325 MG tablet Take 1 tablet by mouth every 6 (six) hours as needed for moderate pain. 12 tablet 0  . risperiDONE (RISPERDAL) 1 MG tablet Take 1 tablet (1 mg total) by mouth at bedtime. 30 tablet 0   No current facility-administered medications for this visit.      ROS:  See HPI  Physical Exam:  Vein mapping  +-----------------+-------------+----------+------------------------+ Right Cephalic   Diameter (cm)Depth (cm)        Findings         +-----------------+-------------+----------+------------------------+ Shoulder             0.30                                        +-----------------+-------------+----------+------------------------+ Prox upper arm       0.22                                        +-----------------+-------------+----------+------------------------+ Mid upper arm        0.25                                        +-----------------+-------------+----------+------------------------+  Dist upper arm       0.36                                        +-----------------+-------------+----------+------------------------+ Antecubital fossa   384.00                                       +-----------------+-------------+----------+------------------------+ Prox forearm         0.24                                        +-----------------+-------------+----------+------------------------+ Mid forearm  multiple small branching +-----------------+-------------+----------+------------------------+  +-----------------+-------------+----------+--------+ Right Basilic    Diameter (cm)Depth (cm)Findings +-----------------+-------------+----------+--------+ Mid upper arm        0.36                        +-----------------+-------------+----------+--------+ Dist upper arm       0.37                        +-----------------+-------------+----------+--------+ Antecubital fossa    0.30                        +-----------------+-------------+----------+--------+  +-----------------+-------------+----------+--------+ Left Cephalic    Diameter (cm)Depth (cm)Findings +-----------------+-------------+----------+--------+ Shoulder             0.27                        +-----------------+-------------+----------+--------+ Prox upper arm       0.23                        +-----------------+-------------+----------+--------+ Mid upper arm        0.20                        +-----------------+-------------+----------+--------+ Dist upper arm       0.26                        +-----------------+-------------+----------+--------+ Antecubital fossa    0.38                        +-----------------+-------------+----------+--------+ Prox forearm         0.21                        +-----------------+-------------+----------+--------+ Mid forearm          0.19                         +-----------------+-------------+----------+--------+ Dist forearm         0.18                        +-----------------+-------------+----------+--------+  +-----------------+-------------+----------+--------------+ Left Basilic     Diameter (cm)Depth (cm)   Findings    +-----------------+-------------+----------+--------------+ Prox upper arm       0.32                              +-----------------+-------------+----------+--------------+ Mid upper arm        0.41                              +-----------------+-------------+----------+--------------+ Dist upper arm       0.42                              +-----------------+-------------+----------+--------------+ Antecubital fossa    0.32               joins cephalic +-----------------+-------------+----------+--------------+  Summary: Right: Patent cephalic and basilic veins where visualized. Left: Patent cephalic and basilic veins.  Arterial duplex Right Pre-Dialysis Findings: +-----------------------+----------+--------------------+---------+--------+ Location  PSV (cm/s)Intralum. Diam. (cm)Waveform Comments +-----------------------+----------+--------------------+---------+--------+ Brachial Antecub. fossa65        0.50                triphasic         +-----------------------+----------+--------------------+---------+--------+ Radial Art at Wrist    56        0.26                triphasic         +-----------------------+----------+--------------------+---------+--------+ Ulnar Art at Wrist     78        0.27                triphasic         +-----------------------+----------+--------------------+---------+--------+    Left Pre-Dialysis Findings: +-----------------------+----------+--------------------+---------+--------+ Location               PSV (cm/s)Intralum. Diam. (cm)Waveform Comments  +-----------------------+----------+--------------------+---------+--------+ Brachial Antecub. fossa86        0.45                triphasic         +-----------------------+----------+--------------------+---------+--------+ Radial Art at Wrist    57        0.21                triphasic         +-----------------------+----------+--------------------+---------+--------+ Ulnar Art at Wrist     71        0.32                triphasic         +-----------------------+----------+--------------------+---------+--------+      Summary:   Right: Patent brachial, radial, and ulnar arteries. Brachial        bifurcation at the ante cubitus. Left: Patent brachial, radial, and ulnar arteries. Brachial       bifurcation at the ante cubitus.   Incision:  Well healed left AC incision Extremities:  4+/5 left UE, decreased sensation in the fore arm.  Palpable radial artery with triphasic Ulnar, radial and palmer arch signals.   Assessment/Plan:  This is a 33 y.o. male who is s/p: S/P ligation of the left BC fistula due to steal symptoms that were intolerable.  We will plan new access right UE brachial basilic fistula in 4 weeks to allow recovery of the left UE.  The patient is in agreement with this plan.   Roxy Horseman , PA-C [facility] [phone]  Clinic MD:  Donzetta Matters

## 2019-03-25 NOTE — H&P (View-Only) (Signed)
POST OPERATIVE OFFICE NOTE    CC:  F/u for surgery  HPI:  This is a 33 y.o. male who is s/p Ligation of left brachiocephalic AV fistula.  He had symptoms of steal.  He reported numbness and weakness in his left hand that has been progressive since fistula placement.  He stated his symptoms were not tolerable.   He states his numbness and weakness seem to be slowly improving.    Allergies  Allergen Reactions  . Nsaids Other (See Comments)    "Kidney problems "  . Other Other (See Comments)    Patient stated that he goes to Dr. Clover Mealy for his kidney problems and only takes medication that she gives him.     Current Outpatient Medications  Medication Sig Dispense Refill  . amLODipine (NORVASC) 5 MG tablet Take 10 mg by mouth daily.     . benztropine (COGENTIN) 0.5 MG tablet Take 1 tablet (0.5 mg total) by mouth at bedtime. 30 tablet 0  . calcitRIOL (ROCALTROL) 0.25 MCG capsule Take 0.25 mcg by mouth daily.    Marland Kitchen HYDROcodone-acetaminophen (NORCO) 5-325 MG tablet Take 1 tablet by mouth every 6 (six) hours as needed for moderate pain. 12 tablet 0  . risperiDONE (RISPERDAL) 1 MG tablet Take 1 tablet (1 mg total) by mouth at bedtime. 30 tablet 0   No current facility-administered medications for this visit.      ROS:  See HPI  Physical Exam:  Vein mapping  +-----------------+-------------+----------+------------------------+ Right Cephalic   Diameter (cm)Depth (cm)        Findings         +-----------------+-------------+----------+------------------------+ Shoulder             0.30                                        +-----------------+-------------+----------+------------------------+ Prox upper arm       0.22                                        +-----------------+-------------+----------+------------------------+ Mid upper arm        0.25                                        +-----------------+-------------+----------+------------------------+  Dist upper arm       0.36                                        +-----------------+-------------+----------+------------------------+ Antecubital fossa   384.00                                       +-----------------+-------------+----------+------------------------+ Prox forearm         0.24                                        +-----------------+-------------+----------+------------------------+ Mid forearm  multiple small branching +-----------------+-------------+----------+------------------------+  +-----------------+-------------+----------+--------+ Right Basilic    Diameter (cm)Depth (cm)Findings +-----------------+-------------+----------+--------+ Mid upper arm        0.36                        +-----------------+-------------+----------+--------+ Dist upper arm       0.37                        +-----------------+-------------+----------+--------+ Antecubital fossa    0.30                        +-----------------+-------------+----------+--------+  +-----------------+-------------+----------+--------+ Left Cephalic    Diameter (cm)Depth (cm)Findings +-----------------+-------------+----------+--------+ Shoulder             0.27                        +-----------------+-------------+----------+--------+ Prox upper arm       0.23                        +-----------------+-------------+----------+--------+ Mid upper arm        0.20                        +-----------------+-------------+----------+--------+ Dist upper arm       0.26                        +-----------------+-------------+----------+--------+ Antecubital fossa    0.38                        +-----------------+-------------+----------+--------+ Prox forearm         0.21                        +-----------------+-------------+----------+--------+ Mid forearm          0.19                         +-----------------+-------------+----------+--------+ Dist forearm         0.18                        +-----------------+-------------+----------+--------+  +-----------------+-------------+----------+--------------+ Left Basilic     Diameter (cm)Depth (cm)   Findings    +-----------------+-------------+----------+--------------+ Prox upper arm       0.32                              +-----------------+-------------+----------+--------------+ Mid upper arm        0.41                              +-----------------+-------------+----------+--------------+ Dist upper arm       0.42                              +-----------------+-------------+----------+--------------+ Antecubital fossa    0.32               joins cephalic +-----------------+-------------+----------+--------------+  Summary: Right: Patent cephalic and basilic veins where visualized. Left: Patent cephalic and basilic veins.  Arterial duplex Right Pre-Dialysis Findings: +-----------------------+----------+--------------------+---------+--------+ Location  PSV (cm/s)Intralum. Diam. (cm)Waveform Comments +-----------------------+----------+--------------------+---------+--------+ Brachial Antecub. fossa65        0.50                triphasic         +-----------------------+----------+--------------------+---------+--------+ Radial Art at Wrist    56        0.26                triphasic         +-----------------------+----------+--------------------+---------+--------+ Ulnar Art at Wrist     78        0.27                triphasic         +-----------------------+----------+--------------------+---------+--------+    Left Pre-Dialysis Findings: +-----------------------+----------+--------------------+---------+--------+ Location               PSV (cm/s)Intralum. Diam. (cm)Waveform Comments  +-----------------------+----------+--------------------+---------+--------+ Brachial Antecub. fossa86        0.45                triphasic         +-----------------------+----------+--------------------+---------+--------+ Radial Art at Wrist    57        0.21                triphasic         +-----------------------+----------+--------------------+---------+--------+ Ulnar Art at Wrist     71        0.32                triphasic         +-----------------------+----------+--------------------+---------+--------+      Summary:   Right: Patent brachial, radial, and ulnar arteries. Brachial        bifurcation at the ante cubitus. Left: Patent brachial, radial, and ulnar arteries. Brachial       bifurcation at the ante cubitus.   Incision:  Well healed left AC incision Extremities:  4+/5 left UE, decreased sensation in the fore arm.  Palpable radial artery with triphasic Ulnar, radial and palmer arch signals.   Assessment/Plan:  This is a 33 y.o. male who is s/p: S/P ligation of the left BC fistula due to steal symptoms that were intolerable.  We will plan new access right UE brachial basilic fistula in 4 weeks to allow recovery of the left UE.  The patient is in agreement with this plan.   Roxy Horseman , PA-C [facility] [phone]  Clinic MD:  Donzetta Matters

## 2019-03-28 ENCOUNTER — Other Ambulatory Visit: Payer: Self-pay

## 2019-04-08 ENCOUNTER — Inpatient Hospital Stay (HOSPITAL_COMMUNITY): Admission: RE | Admit: 2019-04-08 | Payer: Medicaid Other | Source: Ambulatory Visit

## 2019-04-08 ENCOUNTER — Encounter (HOSPITAL_COMMUNITY): Payer: Self-pay

## 2019-04-08 ENCOUNTER — Other Ambulatory Visit: Payer: Self-pay

## 2019-04-08 NOTE — Progress Notes (Addendum)
Pt hard of hearing, so gave permission for his wife [name] to complete the SDW call.  Ms [name] that pt is asymptomatic at this time. Scheduled for Covid test today. PCp is [facility] and nephrologist is Dr. Clover Mealy.Denies having a cardiologist.  Denies that pt had a stress test and cardiac cath. . Medication instructions given.-Can take Amlodipine DOS. To stop taking Aspirin,taking  vitamins, fish oil and herbal  NSAIDs,Ibuprofen, Advil, Naproxen (Aleve), Motrin, BC and Goody Powder. Arrival time for surgery 0725AM. Ms [name]  verbalized understanding of all pre-op instructions.Opportunity to ask questions was given.

## 2019-04-11 ENCOUNTER — Other Ambulatory Visit (HOSPITAL_COMMUNITY)
Admission: RE | Admit: 2019-04-11 | Discharge: 2019-04-11 | Disposition: A | Discharge status: Home / Self Care | Payer: Medicaid Other | Source: Ambulatory Visit | Attending: Vascular Surgery | Admitting: Vascular Surgery

## 2019-04-11 DIAGNOSIS — Z01812 Encounter for preprocedural laboratory examination: Secondary | ICD-10-CM | POA: Diagnosis not present

## 2019-04-11 DIAGNOSIS — Z20828 Contact with and (suspected) exposure to other viral communicable diseases: Secondary | ICD-10-CM | POA: Insufficient documentation

## 2019-04-11 LAB — SARS CORONAVIRUS 2 (TAT 6-24 HRS): SARS Coronavirus 2: NEGATIVE

## 2019-04-12 ENCOUNTER — Other Ambulatory Visit: Payer: Self-pay

## 2019-04-12 ENCOUNTER — Encounter (HOSPITAL_COMMUNITY): Payer: Self-pay

## 2019-04-12 ENCOUNTER — Ambulatory Visit (HOSPITAL_COMMUNITY): Payer: Medicaid Other | Admitting: Anesthesiology

## 2019-04-12 ENCOUNTER — Ambulatory Visit (HOSPITAL_COMMUNITY)
Admission: RE | Admit: 2019-04-12 | Discharge: 2019-04-12 | Disposition: A | Discharge status: Home / Self Care | Payer: Medicaid Other | Attending: Vascular Surgery | Admitting: Vascular Surgery

## 2019-04-12 ENCOUNTER — Encounter (HOSPITAL_COMMUNITY)
Admission: RE | Disposition: A | Discharge status: Home / Self Care | Payer: Self-pay | Source: Home / Self Care | Attending: Vascular Surgery

## 2019-04-12 DIAGNOSIS — Z992 Dependence on renal dialysis: Secondary | ICD-10-CM | POA: Insufficient documentation

## 2019-04-12 DIAGNOSIS — N184 Chronic kidney disease, stage 4 (severe): Secondary | ICD-10-CM

## 2019-04-12 DIAGNOSIS — N189 Chronic kidney disease, unspecified: Secondary | ICD-10-CM

## 2019-04-12 DIAGNOSIS — Z79899 Other long term (current) drug therapy: Secondary | ICD-10-CM | POA: Insufficient documentation

## 2019-04-12 DIAGNOSIS — Z886 Allergy status to analgesic agent status: Secondary | ICD-10-CM | POA: Diagnosis not present

## 2019-04-12 HISTORY — PX: BASCILIC VEIN TRANSPOSITION: SHX5742

## 2019-04-12 LAB — POCT I-STAT, CHEM 8
BUN: 72 mg/dL — ABNORMAL HIGH (ref 6–20)
Calcium, Ion: 1.14 mmol/L — ABNORMAL LOW (ref 1.15–1.40)
Chloride: 115 mmol/L — ABNORMAL HIGH (ref 98–111)
Creatinine, Ser: 8.4 mg/dL — ABNORMAL HIGH (ref 0.61–1.24)
Glucose, Bld: 81 mg/dL (ref 70–99)
HCT: 33 % — ABNORMAL LOW (ref 39.0–52.0)
Hemoglobin: 11.2 g/dL — ABNORMAL LOW (ref 13.0–17.0)
Potassium: 4.5 mmol/L (ref 3.5–5.1)
Sodium: 141 mmol/L (ref 135–145)
TCO2: 16 mmol/L — ABNORMAL LOW (ref 22–32)

## 2019-04-12 SURGERY — TRANSPOSITION, VEIN, BASILIC
Anesthesia: Monitor Anesthesia Care | Site: Arm Upper | Laterality: Right

## 2019-04-12 MED ORDER — LIDOCAINE-EPINEPHRINE 0.5 %-1:200000 IJ SOLN
INTRAMUSCULAR | Status: DC | PRN
  Administered 2019-04-12: 12 mL

## 2019-04-12 MED ORDER — FENTANYL CITRATE (PF) 250 MCG/5ML IJ SOLN
INTRAMUSCULAR | Status: DC | PRN
  Administered 2019-04-12 (×2): 50 ug via INTRAVENOUS

## 2019-04-12 MED ORDER — MIDAZOLAM HCL 2 MG/2ML IJ SOLN
INTRAMUSCULAR | Status: AC
  Filled 2019-04-12: qty 2

## 2019-04-12 MED ORDER — ONDANSETRON HCL 4 MG/2ML IJ SOLN
INTRAMUSCULAR | Status: AC
  Filled 2019-04-12: qty 2

## 2019-04-12 MED ORDER — CHLORHEXIDINE GLUCONATE 4 % EX LIQD: 60.0000 mL | Freq: Once | CUTANEOUS | Status: DC

## 2019-04-12 MED ORDER — PROPOFOL 10 MG/ML IV BOLUS
INTRAVENOUS | Status: DC | PRN
  Administered 2019-04-12: 20 mg via INTRAVENOUS

## 2019-04-12 MED ORDER — CEFAZOLIN SODIUM-DEXTROSE 2-4 GM/100ML-% IV SOLN
INTRAVENOUS | Status: AC
  Filled 2019-04-12: qty 100

## 2019-04-12 MED ORDER — PROMETHAZINE HCL 25 MG/ML IJ SOLN: 6.2500 mg | INTRAMUSCULAR | Status: DC | PRN

## 2019-04-12 MED ORDER — PROTAMINE SULFATE 10 MG/ML IV SOLN
INTRAVENOUS | Status: DC | PRN
  Administered 2019-04-12: 10 mg via INTRAVENOUS
  Administered 2019-04-12: 30 mg via INTRAVENOUS

## 2019-04-12 MED ORDER — FENTANYL CITRATE (PF) 250 MCG/5ML IJ SOLN
INTRAMUSCULAR | Status: AC
  Filled 2019-04-12: qty 5

## 2019-04-12 MED ORDER — PROPOFOL 500 MG/50ML IV EMUL
INTRAVENOUS | Status: DC | PRN
  Administered 2019-04-12: 125 ug/kg/min via INTRAVENOUS

## 2019-04-12 MED ORDER — 0.9 % SODIUM CHLORIDE (POUR BTL) OPTIME
TOPICAL | Status: DC | PRN
  Administered 2019-04-12: 1000 mL

## 2019-04-12 MED ORDER — HYDROCODONE-ACETAMINOPHEN 5-325 MG PO TABS: 1.0000 | ORAL_TABLET | Freq: Four times a day (QID) | ORAL | 0 refills | Status: DC | PRN

## 2019-04-12 MED ORDER — HEPARIN SODIUM (PORCINE) 1000 UNIT/ML IJ SOLN
INTRAMUSCULAR | Status: DC | PRN
  Administered 2019-04-12: 8500 [IU] via INTRAVENOUS

## 2019-04-12 MED ORDER — SODIUM CHLORIDE 0.9 % IV SOLN
INTRAVENOUS | Status: DC | PRN
  Administered 2019-04-12: 500 mL

## 2019-04-12 MED ORDER — LIDOCAINE-EPINEPHRINE 0.5 %-1:200000 IJ SOLN
INTRAMUSCULAR | Status: AC
  Filled 2019-04-12: qty 1

## 2019-04-12 MED ORDER — LIDOCAINE 2% (20 MG/ML) 5 ML SYRINGE
INTRAMUSCULAR | Status: AC
  Filled 2019-04-12: qty 5

## 2019-04-12 MED ORDER — MIDAZOLAM HCL 2 MG/2ML IJ SOLN
INTRAMUSCULAR | Status: DC | PRN
  Administered 2019-04-12: 2 mg via INTRAVENOUS

## 2019-04-12 MED ORDER — PHENYLEPHRINE 40 MCG/ML (10ML) SYRINGE FOR IV PUSH (FOR BLOOD PRESSURE SUPPORT)
PREFILLED_SYRINGE | INTRAVENOUS | Status: DC | PRN
  Administered 2019-04-12: 120 ug via INTRAVENOUS
  Administered 2019-04-12: 40 ug via INTRAVENOUS

## 2019-04-12 MED ORDER — CEFAZOLIN SODIUM-DEXTROSE 2-4 GM/100ML-% IV SOLN
2.0000 g | INTRAVENOUS | Status: AC
  Administered 2019-04-12: 2 g via INTRAVENOUS

## 2019-04-12 MED ORDER — ONDANSETRON HCL 4 MG/2ML IJ SOLN
INTRAMUSCULAR | Status: DC | PRN
  Administered 2019-04-12: 4 mg via INTRAVENOUS

## 2019-04-12 MED ORDER — FENTANYL CITRATE (PF) 100 MCG/2ML IJ SOLN: 25.0000 ug | INTRAMUSCULAR | Status: DC | PRN

## 2019-04-12 MED ORDER — SODIUM CHLORIDE 0.9 % IV SOLN
INTRAVENOUS | Status: AC
  Filled 2019-04-12: qty 1.2

## 2019-04-12 MED ORDER — LIDOCAINE 2% (20 MG/ML) 5 ML SYRINGE
INTRAMUSCULAR | Status: DC | PRN
  Administered 2019-04-12: 100 mg via INTRAVENOUS

## 2019-04-12 MED ORDER — PROPOFOL 10 MG/ML IV BOLUS
INTRAVENOUS | Status: AC
  Filled 2019-04-12: qty 20

## 2019-04-12 MED ORDER — SODIUM CHLORIDE 0.9 % IV SOLN
INTRAVENOUS | Status: DC
  Administered 2019-04-12 (×2): via INTRAVENOUS

## 2019-04-12 SURGICAL SUPPLY — 34 items
ADH SKN CLS APL DERMABOND .7 (GAUZE/BANDAGES/DRESSINGS) ×1
ARMBAND PINK RESTRICT EXTREMIT (MISCELLANEOUS) ×3 IMPLANT
CANISTER SUCT 3000ML PPV (MISCELLANEOUS) ×3 IMPLANT
CANNULA VESSEL 3MM 2 BLNT TIP (CANNULA) ×2 IMPLANT
CLIP VESOCCLUDE MED 24/CT (CLIP) IMPLANT
CLIP VESOCCLUDE MED 6/CT (CLIP) ×2 IMPLANT
CLIP VESOCCLUDE SM WIDE 24/CT (CLIP) IMPLANT
CLIP VESOCCLUDE SM WIDE 6/CT (CLIP) ×6 IMPLANT
COVER PROBE W GEL 5X96 (DRAPES) ×3 IMPLANT
COVER WAND RF STERILE (DRAPES) ×3 IMPLANT
DERMABOND ADVANCED (GAUZE/BANDAGES/DRESSINGS) ×2
DERMABOND ADVANCED .7 DNX12 (GAUZE/BANDAGES/DRESSINGS) ×1 IMPLANT
ELECT REM PT RETURN 9FT ADLT (ELECTROSURGICAL) ×3
ELECTRODE REM PT RTRN 9FT ADLT (ELECTROSURGICAL) ×1 IMPLANT
GLOVE BIO SURGEON STRL SZ7.5 (GLOVE) ×3 IMPLANT
GOWN STRL REUS W/ TWL LRG LVL3 (GOWN DISPOSABLE) ×2 IMPLANT
GOWN STRL REUS W/ TWL XL LVL3 (GOWN DISPOSABLE) ×1 IMPLANT
GOWN STRL REUS W/TWL LRG LVL3 (GOWN DISPOSABLE) ×6
GOWN STRL REUS W/TWL XL LVL3 (GOWN DISPOSABLE) ×3
KIT BASIN OR (CUSTOM PROCEDURE TRAY) ×3 IMPLANT
KIT TURNOVER KIT B (KITS) ×3 IMPLANT
NS IRRIG 1000ML POUR BTL (IV SOLUTION) ×3 IMPLANT
PACK CV ACCESS (CUSTOM PROCEDURE TRAY) ×3 IMPLANT
PAD ARMBOARD 7.5X6 YLW CONV (MISCELLANEOUS) ×6 IMPLANT
SUT MNCRL AB 4-0 PS2 18 (SUTURE) ×3 IMPLANT
SUT PROLENE 6 0 BV (SUTURE) ×3 IMPLANT
SUT SILK 2 0 SH (SUTURE) IMPLANT
SUT SILK 3 0 (SUTURE) ×3
SUT SILK 3-0 18XBRD TIE 12 (SUTURE) IMPLANT
SUT VIC AB 3-0 SH 27 (SUTURE) ×3
SUT VIC AB 3-0 SH 27X BRD (SUTURE) ×1 IMPLANT
TOWEL GREEN STERILE (TOWEL DISPOSABLE) ×3 IMPLANT
UNDERPAD 30X30 (UNDERPADS AND DIAPERS) ×3 IMPLANT
WATER STERILE IRR 1000ML POUR (IV SOLUTION) ×3 IMPLANT

## 2019-04-12 NOTE — Anesthesia Postprocedure Evaluation (Signed)
Anesthesia Post Note  Patient: [name]  Procedure(s) Performed: RIGHT UPPER EXTREMITY BASCILIC VEIN TRANSPOSITION FIRST STAGE FISTULA (Right Arm Upper)     Patient location during evaluation: PACU Anesthesia Type: MAC Level of consciousness: awake and alert Pain management: pain level controlled Vital Signs Assessment: post-procedure vital signs reviewed and stable Respiratory status: spontaneous breathing, nonlabored ventilation, respiratory function stable and patient connected to nasal cannula oxygen Cardiovascular status: stable and blood pressure returned to baseline Postop Assessment: no apparent nausea or vomiting Anesthetic complications: no    Last Vitals:  Vitals:   04/12/19 0840 04/12/19 1130  BP: (!) 152/89 (!) 144/96  Pulse: 72 97  Resp: 18 14  Temp: 37.2 C 36.4 C  SpO2: 100% 97%    Last Pain:  Vitals:   04/12/19 1130  TempSrc:   PainSc: 0-No pain                 Yobany Vroom S

## 2019-04-12 NOTE — Anesthesia Procedure Notes (Addendum)
Procedure Name: MAC Date/Time: 04/12/2019 9:58 AM Performed by: Bryson Corona, CRNA Pre-anesthesia Checklist: Patient identified, Emergency Drugs available, Suction available and Patient being monitored Patient Re-evaluated:Patient Re-evaluated prior to induction Oxygen Delivery Method: Simple face mask

## 2019-04-12 NOTE — Interval H&P Note (Signed)
History and Physical Interval Note:  04/12/2019 9:14 AM  [name]  has presented today for surgery, with the diagnosis of CHRONIC KIDNEY DISEASE STAGE 4.  The various methods of treatment have been discussed with the patient and family. After consideration of risks, benefits and other options for treatment, the patient has consented to  Procedure(s): RIGHT UPPER EXTREMITY [place] (Right) as a surgical intervention.  The patient's history has been reviewed, patient examined, no change in status, stable for surgery.  I have reviewed the patient's chart and labs.  Questions were answered to the patient's satisfaction.     Deitra Mayo

## 2019-04-12 NOTE — Op Note (Signed)
    NAME: [name]    MRN: [id number] DOB: [birth year]    DATE OF OPERATION: 04/12/2019  PREOP DIAGNOSIS:    Stage IV chronic kidney disease  POSTOP DIAGNOSIS:    Same  PROCEDURE:    Right brachiocephalic AV fistula  SURGEON: Judeth Cornfield. Scot Dock, MD  ASSIST: Leontine Locket, PA  ANESTHESIA: Local with sedation  EBL: Minimal  INDICATIONS:    [name] is a 33 y.o. male who is not yet on dialysis.  He presents for new access.  FINDINGS:   The upper arm cephalic vein was [id number] mm in diameter.  At the completion of the procedure there was a good thrill in the fistula and a palpable right radial pulse.  TECHNIQUE:   The patient was taken to the operating room and I looked at the cephalic vein and basilic vein with the SonoSite.  I felt the cephalic vein was reasonable in size and he was a potentially a candidate for a brachiocephalic fistula.  The vein was somewhat lateral in relationship to the artery.  The basilic vein was also good size.  The right arm was then prepped and draped in usual sterile fashion.  After the skin was anesthetized with 1% lidocaine a transverse incision was made just above the antecubital level.  Here the brachial artery was dissected free beneath the fascia.  It was somewhat protected by the biceps and this was partially divided.  Using 2 longitudinal incisions over the cephalic vein the cephalic vein was dissected free to below the antecubital level.  Branches were divided between clips and 3-0 silk ties.  The vein was ligated distally and irrigated up nicely with heparinized saline.  Patient was heparinized.  The brachial artery was clamped proximally and distally and a longitudinal arteriotomy was made.  The vein was sewn into side to the artery using continuous 6-0 Prolene suture.  At the completion there was a good thrill in the fistula and a palpable radial pulse.  The heparin was partially reversed with protamine.  Each of the wounds was  then closed with a deep layer of 3-0 Vicryl and the skin closed with 4-0 Vicryl.  Dermabond was applied.  Patient tolerated procedure well was transferred to recovery room in stable condition.  All needle and sponge counts were correct.  Deitra Mayo, MD, FACS [facility]  DATE OF DICTATION:   04/12/2019

## 2019-04-12 NOTE — Anesthesia Preprocedure Evaluation (Signed)
Anesthesia Evaluation  Patient identified by MRN, date of birth, ID band Patient awake    Reviewed: Allergy & Precautions, NPO status , Patient's Chart, lab work & pertinent test results  Airway Mallampati: II  TM Distance: >3 FB Neck ROM: Full    Dental no notable dental hx.    Pulmonary neg pulmonary ROS, former smoker,    Pulmonary exam normal breath sounds clear to auscultation       Cardiovascular hypertension, Normal cardiovascular exam Rhythm:Regular Rate:Normal     Neuro/Psych Bipolar Disorder negative neurological ROS     GI/Hepatic negative GI ROS, Neg liver ROS,   Endo/Other  negative endocrine ROS  Renal/GU ESRFRenal disease  negative genitourinary   Musculoskeletal negative musculoskeletal ROS (+)   Abdominal   Peds negative pediatric ROS (+)  Hematology negative hematology ROS (+)   Anesthesia Other Findings   Reproductive/Obstetrics negative OB ROS                             Anesthesia Physical Anesthesia Plan  ASA: III  Anesthesia Plan: MAC   Post-op Pain Management:    Induction: Intravenous  PONV Risk Score and Plan: Ondansetron  Airway Management Planned: Simple Face Mask  Additional Equipment:   Intra-op Plan:   Post-operative Plan:   Informed Consent: I have reviewed the patients History and Physical, chart, labs and discussed the procedure including the risks, benefits and alternatives for the proposed anesthesia with the patient or authorized representative who has indicated his/her understanding and acceptance.     Dental advisory given  Plan Discussed with: CRNA and Surgeon  Anesthesia Plan Comments:         Anesthesia Quick Evaluation

## 2019-04-12 NOTE — Discharge Instructions (Signed)
° °  [facility]  Discharge Instructions  AV Fistula or Graft Surgery for Dialysis Access  Please refer to the following instructions for your post-procedure care. Your surgeon or physician assistant will discuss any changes with you.  Activity  You may drive the day following your surgery, if you are comfortable and no longer taking prescription pain medication. Resume full activity as the soreness in your incision resolves.  Bathing/Showering  You may shower after you go home. Keep your incision dry for 48 hours. Do not soak in a bathtub, hot tub, or swim until the incision heals completely. You may not shower if you have a hemodialysis catheter.  Incision Care  Clean your incision with mild soap and water after 48 hours. Pat the area dry with a clean towel. You do not need a bandage unless otherwise instructed. Do not apply any ointments or creams to your incision. You may have skin glue on your incision. Do not peel it off. It will come off on its own in about one week. Your arm may swell a bit after surgery. To reduce swelling use pillows to elevate your arm so it is above your heart. Your doctor will tell you if you need to lightly wrap your arm with an ACE bandage.  Diet  Resume your normal diet. There are not special food restrictions following this procedure. In order to heal from your surgery, it is CRITICAL to get adequate nutrition. Your body requires vitamins, minerals, and protein. Vegetables are the best source of vitamins and minerals. Vegetables also provide the perfect balance of protein. Processed food has little nutritional value, so try to avoid this.  Medications  Resume taking all of your medications. If your incision is causing pain, you may take over-the counter pain relievers such as acetaminophen (Tylenol). If you were prescribed a stronger pain medication, please be aware these medications can cause nausea and constipation. Prevent  nausea by taking the medication with a snack or meal. Avoid constipation by drinking plenty of fluids and eating foods with high amount of fiber, such as fruits, vegetables, and grains.  Do not take Tylenol if you are taking prescription pain medications.  Follow up Your surgeon may want to see you in the office following your access surgery. If so, this will be arranged at the time of your surgery.  Please call us immediately for any of the following conditions:  Increased pain, redness, drainage (pus) from your incision site Fever of 101 degrees or higher Severe or worsening pain at your incision site Hand pain or numbness.  Reduce your risk of vascular disease:  Stop smoking. If you would like help, call [facility] at 1-800-QUIT-NOW (320)576-2250) or [place] at [place] your cholesterol Maintain a desired weight Control your diabetes Keep your blood pressure down  Dialysis  It will take several weeks to several months for your new dialysis access to be ready for use. Your surgeon will determine when it is okay to use it. Your nephrologist will continue to direct your dialysis. You can continue to use your Permcath until your new access is ready for use.   04/12/2019 [name] [id number] 12-29-1985  Surgeon(s): Angelia Mould, MD  Procedure(s): RIGHT UPPER EXTREMITY BASCILIC VEIN TRANSPOSITION FIRST STAGE FISTULA  x Do not stick fistula for 12 weeks    If you have any questions, please call the office at [phone].

## 2019-04-12 NOTE — Transfer of Care (Signed)
Immediate Anesthesia Transfer of Care Note  Patient: [name]  Procedure(s) Performed: RIGHT UPPER EXTREMITY BASCILIC VEIN TRANSPOSITION FIRST STAGE FISTULA (Right Arm Upper)  Patient Location: PACU  Anesthesia Type:MAC  Level of Consciousness: drowsy and patient cooperative  Airway & Oxygen Therapy: Patient Spontanous Breathing and Patient connected to face mask oxygen  Post-op Assessment: Report given to RN and Post -op Vital signs reviewed and stable  Post vital signs: Reviewed and stable  Last Vitals:  Vitals Value Taken Time  BP 144/96 04/12/19 1131  Temp    Pulse 83 04/12/19 1131  Resp 15 04/12/19 1131  SpO2 99 % 04/12/19 1131  Vitals shown include unvalidated device data.  Last Pain:  Vitals:   04/12/19 0840  TempSrc: Oral  PainSc:          Complications: No apparent anesthesia complications

## 2019-04-13 ENCOUNTER — Encounter (HOSPITAL_COMMUNITY): Payer: Self-pay | Admitting: Vascular Surgery

## 2019-04-15 ENCOUNTER — Encounter: Payer: Medicaid Other | Admitting: Vascular Surgery

## 2019-04-15 ENCOUNTER — Encounter (HOSPITAL_COMMUNITY): Payer: Medicaid Other

## 2019-05-24 ENCOUNTER — Other Ambulatory Visit: Payer: Self-pay

## 2019-05-24 DIAGNOSIS — N184 Chronic kidney disease, stage 4 (severe): Secondary | ICD-10-CM

## 2019-05-25 ENCOUNTER — Encounter: Payer: Medicaid Other | Admitting: Vascular Surgery

## 2019-05-25 ENCOUNTER — Encounter (HOSPITAL_COMMUNITY): Payer: Medicaid Other

## 2019-07-02 ENCOUNTER — Emergency Department (HOSPITAL_COMMUNITY)
Admission: EM | Admit: 2019-07-02 | Discharge: 2019-07-02 | Disposition: A | Discharge status: Home / Self Care | Payer: Medicaid Other | Attending: Emergency Medicine | Admitting: Emergency Medicine

## 2019-07-02 ENCOUNTER — Encounter (HOSPITAL_COMMUNITY): Payer: Self-pay | Admitting: Emergency Medicine

## 2019-07-02 ENCOUNTER — Other Ambulatory Visit: Payer: Self-pay

## 2019-07-02 DIAGNOSIS — K2900 Acute gastritis without bleeding: Secondary | ICD-10-CM | POA: Diagnosis not present

## 2019-07-02 DIAGNOSIS — Z79899 Other long term (current) drug therapy: Secondary | ICD-10-CM | POA: Diagnosis not present

## 2019-07-02 DIAGNOSIS — Z992 Dependence on renal dialysis: Secondary | ICD-10-CM | POA: Insufficient documentation

## 2019-07-02 DIAGNOSIS — R202 Paresthesia of skin: Secondary | ICD-10-CM | POA: Diagnosis not present

## 2019-07-02 DIAGNOSIS — N186 End stage renal disease: Secondary | ICD-10-CM | POA: Insufficient documentation

## 2019-07-02 DIAGNOSIS — R1013 Epigastric pain: Secondary | ICD-10-CM | POA: Diagnosis present

## 2019-07-02 DIAGNOSIS — R2231 Localized swelling, mass and lump, right upper limb: Secondary | ICD-10-CM | POA: Insufficient documentation

## 2019-07-02 DIAGNOSIS — Z87891 Personal history of nicotine dependence: Secondary | ICD-10-CM | POA: Diagnosis not present

## 2019-07-02 DIAGNOSIS — I120 Hypertensive chronic kidney disease with stage 5 chronic kidney disease or end stage renal disease: Secondary | ICD-10-CM | POA: Insufficient documentation

## 2019-07-02 LAB — COMPREHENSIVE METABOLIC PANEL
ALT: 27 U/L (ref 0–44)
AST: 32 U/L (ref 15–41)
Albumin: 4.1 g/dL (ref 3.5–5.0)
Alkaline Phosphatase: 96 U/L (ref 38–126)
Anion gap: 13 (ref 5–15)
BUN: 26 mg/dL — ABNORMAL HIGH (ref 6–20)
CO2: 27 mmol/L (ref 22–32)
Calcium: 9.5 mg/dL (ref 8.9–10.3)
Chloride: 99 mmol/L (ref 98–111)
Creatinine, Ser: 7.32 mg/dL — ABNORMAL HIGH (ref 0.61–1.24)
GFR calc Af Amer: 10 mL/min — ABNORMAL LOW (ref >60)
GFR calc non Af Amer: 9 mL/min — ABNORMAL LOW (ref >60)
Glucose, Bld: 116 mg/dL — ABNORMAL HIGH (ref 70–99)
Potassium: 3.7 mmol/L (ref 3.5–5.1)
Sodium: 139 mmol/L (ref 135–145)
Total Bilirubin: 0.9 mg/dL (ref 0.3–1.2)
Total Protein: 7.3 g/dL (ref 6.5–8.1)

## 2019-07-02 LAB — CBC
HCT: 37.9 % — ABNORMAL LOW (ref 39.0–52.0)
Hemoglobin: 12.1 g/dL — ABNORMAL LOW (ref 13.0–17.0)
MCH: 30.1 pg (ref 26.0–34.0)
MCHC: 31.9 g/dL (ref 30.0–36.0)
MCV: 94.3 fL (ref 80.0–100.0)
Platelets: 168 10*3/uL (ref 150–400)
RBC: 4.02 MIL/uL — ABNORMAL LOW (ref 4.22–5.81)
RDW: 12 % (ref 11.5–15.5)
WBC: 5.2 10*3/uL (ref 4.0–10.5)
nRBC: 0 % (ref 0.0–0.2)

## 2019-07-02 LAB — LIPASE, BLOOD: Lipase: 72 U/L — ABNORMAL HIGH (ref 11–51)

## 2019-07-02 MED ORDER — ALUM & MAG HYDROXIDE-SIMETH 200-200-20 MG/5ML PO SUSP
30.0000 mL | Freq: Once | ORAL | Status: AC
  Administered 2019-07-02: 30 mL via ORAL
  Filled 2019-07-02: qty 30

## 2019-07-02 MED ORDER — SODIUM CHLORIDE 0.9% FLUSH: 3.0000 mL | Freq: Once | INTRAVENOUS | Status: DC

## 2019-07-02 NOTE — ED Provider Notes (Signed)
[name] Provider Note   CSN: [id number] Arrival date & time: 07/02/19  1544     History Chief Complaint  Patient presents with  . Abdominal Pain    [name] is a 34 y.o. male with a past medical history of GERD, hypertension, ESRD on dialysis, Alport syndrome, presenting to the ED with a chief complaint of epigastric pain and cramping since this morning.  Patient recently diagnosed with ESRD and has been on dialysis for 2 weeks.  Last had dialysis yesterday without any difficulties.  This morning woke up with epigastric cramping sensation and nausea.  He was able to eat but states that the pain got worse after he drank fluids.  He has not taken any medications to help with his symptoms.  Went to bed last night in his usual state of health.  No sick contacts with similar symptoms.  Reports cramping sensation other parts of his body and extremities as well, which has been going on for several weeks.  Denies any changes to bowel movements, hematemesis, chest pain, shortness of breath, fever, injuries or falls. He also complains of swelling near his right scapula that he has noticed for the past 3 to 4 months.  Does not feel painful. He does make urine and denies any dysuria or hematuria.  HPI     Past Medical History:  Diagnosis Date  . Bipolar 1 disorder ([place])   . Depression   . GERD (gastroesophageal reflux disease)   . Hearing difficulty of left ear    75% hearing  . 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  . Hypertension   . Renal disorder     Patient Active Problem List   Diagnosis Date Noted  . CKD (chronic kidney disease) 03/06/2019  . Dehydration 06/07/2018  . Hypertension 06/07/2018  . CKD (chronic kidney disease), stage IV ([place]) 06/07/2018  . Nausea and vomiting 06/07/2018  . Alport syndrome 07/14/2017  . GERD (gastroesophageal reflux disease) 07/14/2017  . History of appendectomy 07/14/2017  . Obesity (BMI  30.0-34.9) 07/14/2017  . Pre-transplant evaluation for kidney transplant 07/14/2017  . Secondary hyperparathyroidism ([place]) 07/14/2017  . Tobacco use 07/14/2017  . Unspecified hypertensive kidney disease with chronic kidney disease stage I through stage IV, or unspecified(403.90) 07/14/2017  . Bipolar 1 disorder ([place]) 09/01/2013  . 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 APPENDECTOMY    . AV FISTULA PLACEMENT Left 03/03/2019   Procedure: ARTERIOVENOUS (AV) FISTULA CREATION LEFT ARM;  Surgeon: Waynetta Sandy, MD;  Location: [facility];  Service: Vascular;  Laterality: Left;  . BASCILIC VEIN TRANSPOSITION Right 04/12/2019   Procedure: RIGHT UPPER EXTREMITY BASCILIC VEIN TRANSPOSITION FIRST STAGE FISTULA;  Surgeon: Angelia Mould, MD;  Location: [facility];  Service: Vascular;  Laterality: Right;  . LIGATION OF ARTERIOVENOUS  FISTULA Left 03/06/2019   Procedure: LIGATION OF ARTERIOVENOUS  FISTULA;  Surgeon: Marty Heck, MD;  Location: [facility];  Service: Vascular;  Laterality: Left;  . spinal tap    . WISDOM TOOTH EXTRACTION         Family History  Problem Relation Age of Onset  . Hypertension Mother   . Kidney failure Mother     Social History   Tobacco Use  . Smoking status: Former Smoker    Packs/day: 0.30    Types: Cigarettes    Quit date: 02/27/2016    Years since quitting: 3.3  .  Smokeless tobacco: Never Used  Substance Use Topics  . Alcohol use: Not Currently    Comment: rare  . Drug use: No    Home Medications Prior to Admission medications   Medication Sig Start Date End Date Taking? Authorizing Provider  amLODipine (NORVASC) 5 MG tablet Take 10 mg by mouth daily.     [provider]  calcitRIOL (ROCALTROL) 0.25 MCG capsule Take 0.25 mcg by mouth daily.    [provider]  HYDROcodone-acetaminophen (NORCO) 5-325 MG tablet Take 1 tablet by mouth every 6 (six) hours as needed for  moderate pain. 04/12/19   Rhyne, Hulen Shouts, PA-C  risperiDONE (RISPERDAL) 1 MG tablet Take 1 tablet (1 mg total) by mouth at bedtime. Patient not taking: Reported on 04/04/2019 12/21/18 12/21/19  Kathlee Nations, MD    Allergies    Nsaids and Other  Review of Systems   Review of Systems  Constitutional: Negative for appetite change, chills and fever.  HENT: Negative for ear pain, rhinorrhea, sneezing and sore throat.   Eyes: Negative for photophobia and visual disturbance.  Respiratory: Negative for cough, chest tightness, shortness of breath and wheezing.   Cardiovascular: Negative for chest pain and palpitations.  Gastrointestinal: Positive for abdominal pain and nausea. Negative for blood in stool, constipation, diarrhea and vomiting.  Genitourinary: Negative for dysuria, hematuria and urgency.  Musculoskeletal: Negative for myalgias.  Skin: Negative for rash.  Neurological: Negative for dizziness, weakness and light-headedness.    Physical Exam Updated Vital Signs BP 140/87   Pulse 80   Temp 98.5 F (36.9 C) (Oral)   Resp 16   SpO2 100%   Physical Exam Vitals and nursing note reviewed.  Constitutional:      General: He is not in acute distress.    Appearance: He is well-developed.  HENT:     Head: Normocephalic and atraumatic.     Nose: Nose normal.  Eyes:     General: No scleral icterus.       Left eye: No discharge.     Conjunctiva/sclera: Conjunctivae normal.  Cardiovascular:     Rate and Rhythm: Normal rate and regular rhythm.     Heart sounds: Normal heart sounds. No murmur. No friction rub. No gallop.   Pulmonary:     Effort: Pulmonary effort is normal. No respiratory distress.     Breath sounds: Normal breath sounds.  Abdominal:     General: Bowel sounds are normal. There is no distension.     Palpations: Abdomen is soft.     Tenderness: There is abdominal tenderness in the epigastric area. There is no guarding.  Musculoskeletal:        General: Normal  range of motion.     Cervical back: Normal range of motion and neck supple.  Skin:    General: Skin is warm and dry.     Findings: No rash.     Comments: Nontender, nonmobile circular mass noted on R scapula without surrounding erythema, fluctuance or tenderness noted.  Neurological:     Mental Status: [name].     Motor: No abnormal muscle tone.     Coordination: Coordination normal.     ED Results / Procedures / Treatments   Labs (all labs ordered are listed, but only abnormal results are displayed) Labs Reviewed  LIPASE, BLOOD - Abnormal; Notable for the following components:      Result Value   Lipase 72 (*)    All other components within normal limits  COMPREHENSIVE  METABOLIC PANEL - Abnormal; Notable for the following components:   Glucose, Bld 116 (*)    BUN 26 (*)    Creatinine, Ser 7.32 (*)    GFR calc non Af Amer 9 (*)    GFR calc Af Amer 10 (*)    All other components within normal limits  CBC - Abnormal; Notable for the following components:   RBC 4.02 (*)    Hemoglobin 12.1 (*)    HCT 37.9 (*)    All other components within normal limits    EKG None  Radiology No results found.  Procedures Procedures (including critical care time)  Medications Ordered in ED Medications  sodium chloride flush (NS) 0.9 % injection 3 mL (has no administration in time range)  alum & mag hydroxide-simeth (MAALOX/MYLANTA) 200-200-20 MG/5ML suspension 30 mL (30 mLs Oral Given 07/02/19 1728)    ED Course  I have reviewed the triage vital signs and the nursing notes.  Pertinent labs & imaging results that were available during my care of the patient were reviewed by me and considered in my medical decision making (see chart for details).    MDM Rules/Calculators/A&P                      34 year old male with past medical history of ESRD on dialysis for the past 2 weeks, Alport syndrome, presenting to the ED with a chief complaint of abdominal cramping.  Symptoms have  been intermittent for the past several weeks but got worse today with associated nausea.  He has not taken any medications to help with his symptoms.  Had an episode yesterday after dialysis where he had some paresthesias of his right lower extremity which improved after several minutes.  On exam patient is overall well-appearing.  Normal sensation noted to bilateral lower extremities.  Abdomen is tender in the upper area without rebound or guarding.  He has an area of soft tissue swelling on R scapula which he states has been chronic for the past several months.  There is no evidence of infection as it is nontender, nonfluctuant without surrounding erythema.  Work-up here significant for creatinine consistent with his history of ESRD, normal CBC, lipase is slightly elevated at 7.2.  Suspect mild inflammation could be the cause of his symptoms.  I doubt cholecystitis, PUD, or other emergent cause of his symptoms.  Patient's symptoms resolved with Maalox given today.  Will advise him to follow-up with PCP, avoid acidic foods and drink clear liquids until improvement.  Advised to return for worsening symptoms.  Patient is hemodynamically stable, in NAD, and able to ambulate in the ED. Evaluation does not show pathology that would require ongoing emergent intervention or inpatient treatment. I have personally reviewed and interpreted all lab work and imaging at today's ED visit. I explained the diagnosis to the patient. Pain has been managed and has no complaints prior to discharge. Patient is comfortable with above plan and is stable for discharge at this time. All questions were answered prior to disposition. Strict return precautions for returning to the ED were discussed. Encouraged follow up with PCP.   An After Visit Summary was printed and given to the patient.   Portions of this note were generated with [employer]. Dictation errors may occur despite best attempts at proofreading.  Final  Clinical Impression(s) / ED Diagnoses Final diagnoses:  Other acute gastritis without hemorrhage    Rx / DC Orders ED Discharge Orders  None       Delia Heady, PA-C 07/02/19 1905    Fredia Sorrow, MD 07/03/19 1154

## 2019-07-02 NOTE — ED Notes (Signed)
Pt is hearing impaired demanding thT HIS WIFE BE BROUGHT BACK SOTHAT HE COULD COMMUNICATE  DIALYSIS PT   FISTULA RT ARM  DUE FOR DIALYSIS Monday  ABD CRAMPS WHICH HE FGEST OFTEN WHILE RECEIVING DIALYSIS

## 2019-07-02 NOTE — Discharge Instructions (Signed)
You can take Maalox as needed for your symptoms. Continue your dialysis sessions as regularly scheduled. Drink clear liquids and try to avoid acidic foods until your symptoms improve. Return to the ED if you start to develop worsening abdominal pain, develop chest pain, shortness of breath, leg swelling, vomiting or coughing up blood.

## 2019-07-02 NOTE — ED Notes (Signed)
The pts abd cramps are much better

## 2019-07-02 NOTE — ED Triage Notes (Signed)
C/o upper abd cramping since having dialysis yesterday.  States he had R leg numbness after dialysis yesterday that has resolved.   Also c/o abscess to R scapula.

## 2019-09-24 ENCOUNTER — Emergency Department (HOSPITAL_COMMUNITY): Payer: Medicare Other

## 2019-09-24 ENCOUNTER — Other Ambulatory Visit: Payer: Self-pay

## 2019-09-24 ENCOUNTER — Emergency Department (HOSPITAL_COMMUNITY)
Admission: EM | Admit: 2019-09-24 | Discharge: 2019-09-25 | Disposition: A | Discharge status: Home / Self Care | Payer: Medicare Other | Attending: Emergency Medicine | Admitting: Emergency Medicine

## 2019-09-24 ENCOUNTER — Encounter (HOSPITAL_COMMUNITY): Payer: Self-pay

## 2019-09-24 DIAGNOSIS — Z992 Dependence on renal dialysis: Secondary | ICD-10-CM | POA: Diagnosis not present

## 2019-09-24 DIAGNOSIS — Z79899 Other long term (current) drug therapy: Secondary | ICD-10-CM | POA: Insufficient documentation

## 2019-09-24 DIAGNOSIS — N186 End stage renal disease: Secondary | ICD-10-CM | POA: Insufficient documentation

## 2019-09-24 DIAGNOSIS — Z87891 Personal history of nicotine dependence: Secondary | ICD-10-CM | POA: Insufficient documentation

## 2019-09-24 DIAGNOSIS — I120 Hypertensive chronic kidney disease with stage 5 chronic kidney disease or end stage renal disease: Secondary | ICD-10-CM | POA: Insufficient documentation

## 2019-09-24 LAB — BASIC METABOLIC PANEL
Anion gap: 9 (ref 5–15)
BUN: 76 mg/dL — ABNORMAL HIGH (ref 6–20)
CO2: 15 mmol/L — ABNORMAL LOW (ref 22–32)
Calcium: 8.8 mg/dL — ABNORMAL LOW (ref 8.9–10.3)
Chloride: 111 mmol/L (ref 98–111)
Creatinine, Ser: 10.94 mg/dL — ABNORMAL HIGH (ref 0.61–1.24)
GFR calc Af Amer: 6 mL/min — ABNORMAL LOW (ref >60)
GFR calc non Af Amer: 6 mL/min — ABNORMAL LOW (ref >60)
Glucose, Bld: 78 mg/dL (ref 70–99)
Potassium: 4.4 mmol/L (ref 3.5–5.1)
Sodium: 135 mmol/L (ref 135–145)

## 2019-09-24 LAB — CBC
HCT: 35.6 % — ABNORMAL LOW (ref 39.0–52.0)
Hemoglobin: 11.5 g/dL — ABNORMAL LOW (ref 13.0–17.0)
MCH: 30.6 pg (ref 26.0–34.0)
MCHC: 32.3 g/dL (ref 30.0–36.0)
MCV: 94.7 fL (ref 80.0–100.0)
Platelets: 180 10*3/uL (ref 150–400)
RBC: 3.76 MIL/uL — ABNORMAL LOW (ref 4.22–5.81)
RDW: 13.5 % (ref 11.5–15.5)
WBC: 4.8 10*3/uL (ref 4.0–10.5)
nRBC: 0 % (ref 0.0–0.2)

## 2019-09-24 NOTE — ED Triage Notes (Signed)
Pt has missed last 2 weeks of dialysis due to "home situation".  When he called dialysis center he was told to come to ED to get cleared.  Only complaint is he gets short of breath walking up stairs.

## 2019-09-25 NOTE — ED Provider Notes (Signed)
[facility] EMERGENCY DEPARTMENT Provider Note   CSN: [id number] Arrival date & time: 09/24/19  2017     History Chief Complaint  Patient presents with  . Needs cleared for dialysis    [name] is a 34 y.o. male.  Patient presents to the emergency department with a chief complaint of needing "clearance for dialysis."  He states that he has not been dialyzed in the past 2 weeks.  He states that he has had some slight shortness of breath with exertion, particularly when going up stairs.  Otherwise he denies any new symptoms.  He states that he is scheduled to have dialysis on Monday.    The history is provided by the patient. No language interpreter was used.       Past Medical History:  Diagnosis Date  . Bipolar 1 disorder ([place])   . Depression   . GERD (gastroesophageal reflux disease)   . Hearing difficulty of left ear    75% hearing  . 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  . Hypertension   . Renal disorder     Patient Active Problem List   Diagnosis Date Noted  . CKD (chronic kidney disease) 03/06/2019  . Dehydration 06/07/2018  . Hypertension 06/07/2018  . CKD (chronic kidney disease), stage IV ([place]) 06/07/2018  . Nausea and vomiting 06/07/2018  . Alport syndrome 07/14/2017  . GERD (gastroesophageal reflux disease) 07/14/2017  . History of appendectomy 07/14/2017  . Obesity (BMI 30.0-34.9) 07/14/2017  . Pre-transplant evaluation for kidney transplant 07/14/2017  . Secondary hyperparathyroidism ([place]) 07/14/2017  . Tobacco use 07/14/2017  . Unspecified hypertensive kidney disease with chronic kidney disease stage I through stage IV, or unspecified(403.90) 07/14/2017  . Bipolar 1 disorder ([place]) 09/01/2013  . 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 APPENDECTOMY    . AV FISTULA PLACEMENT Left 03/03/2019   Procedure: ARTERIOVENOUS (AV) FISTULA CREATION LEFT ARM;  Surgeon: Waynetta Sandy, MD;  Location: [facility];  Service: Vascular;  Laterality: Left;  . BASCILIC VEIN TRANSPOSITION Right 04/12/2019   Procedure: RIGHT UPPER EXTREMITY BASCILIC VEIN TRANSPOSITION FIRST STAGE FISTULA;  Surgeon: Angelia Mould, MD;  Location: [facility];  Service: Vascular;  Laterality: Right;  . LIGATION OF ARTERIOVENOUS  FISTULA Left 03/06/2019   Procedure: LIGATION OF ARTERIOVENOUS  FISTULA;  Surgeon: Marty Heck, MD;  Location: [facility];  Service: Vascular;  Laterality: Left;  . spinal tap    . WISDOM TOOTH EXTRACTION         Family History  Problem Relation Age of Onset  . Hypertension Mother   . Kidney failure Mother     Social History   Tobacco Use  . Smoking status: Former Smoker    Packs/day: 0.30    Types: Cigarettes    Quit date: 02/27/2016    Years since quitting: 3.5  . Smokeless tobacco: Never Used  Substance Use Topics  . Alcohol use: Not Currently    Comment: rare  . Drug use: No    Home Medications Prior to Admission medications   Medication Sig Start Date End Date Taking? Authorizing Provider  amLODipine (NORVASC) 10 MG tablet Take 10 mg by mouth daily. 08/24/19  Yes [provider]  AURYXIA 1 GM 210 MG(Fe) tablet Take 210 mg by mouth 5 (five) times daily. 07/26/19  Yes [provider]  calcitRIOL (ROCALTROL) 0.25 MCG capsule Take 0.25 mcg by mouth daily.  Yes [provider]  HYDROcodone-acetaminophen (NORCO) 5-325 MG tablet Take 1 tablet by mouth every 6 (six) hours as needed for moderate pain. Patient not taking: Reported on 09/24/2019 04/12/19   Gabriel Earing, PA-C  risperiDONE (RISPERDAL) 1 MG tablet Take 1 tablet (1 mg total) by mouth at bedtime. Patient not taking: Reported on 04/04/2019 12/21/18 12/21/19  Kathlee Nations, MD    Allergies    Nsaids and Other  Review of Systems   Review of Systems  All other systems reviewed and are negative.   Physical Exam Updated Vital Signs BP 123/67    Pulse 79   Temp 99 F (37.2 C) (Oral)   Resp 20   Ht 5\' 8"  (1.727 m)   SpO2 100%   BMI 28.74 kg/m   Physical Exam Vitals and nursing note reviewed.  Constitutional:      Appearance: He is well-developed.  HENT:     Head: Normocephalic and atraumatic.  Eyes:     Conjunctiva/sclera: Conjunctivae normal.  Cardiovascular:     Rate and Rhythm: Normal rate and regular rhythm.     Heart sounds: No murmur.  Pulmonary:     Effort: Pulmonary effort is normal. No respiratory distress.     Breath sounds: Normal breath sounds.  Abdominal:     Palpations: Abdomen is soft.     Tenderness: There is no abdominal tenderness.  Musculoskeletal:        General: Normal range of motion.     Cervical back: Neck supple.  Skin:    General: Skin is warm and dry.  Neurological:     Mental Status: [name] and oriented to person, place, and time.  Psychiatric:        Mood and Affect: Mood normal.        Behavior: Behavior normal.     ED Results / Procedures / Treatments   Labs (all labs ordered are listed, but only abnormal results are displayed) Labs Reviewed  CBC - Abnormal; Notable for the following components:      Result Value   RBC 3.76 (*)    Hemoglobin 11.5 (*)    HCT 35.6 (*)    All other components within normal limits  BASIC METABOLIC PANEL - Abnormal; Notable for the following components:   CO2 15 (*)    BUN 76 (*)    Creatinine, Ser 10.94 (*)    Calcium 8.8 (*)    GFR calc non Af Amer 6 (*)    GFR calc Af Amer 6 (*)    All other components within normal limits    EKG None  Radiology DG Chest 2 View  Result Date: 09/25/2019 CLINICAL DATA:  Shortness of breath EXAM: CHEST - 2 VIEW COMPARISON:  December 21, 2018 FINDINGS: The heart size and mediastinal contours are within normal limits. Both lungs are clear. The visualized skeletal structures are unremarkable. IMPRESSION: No active cardiopulmonary disease. Electronically Signed   By: Prudencio Pair M.D.   On: 09/25/2019  00:37    Procedures Procedures (including critical care time)  Medications Ordered in ED Medications - No data to display  ED Course  I have reviewed the triage vital signs and the nursing notes.  Pertinent labs & imaging results that were available during my care of the patient were reviewed by me and considered in my medical decision making (see chart for details).    MDM Rules/Calculators/A&P  This patient complains of needing evaluation for dialysis, this involves an extensive number of treatment options, and is a complaint that carries with it a high risk of complications and morbidity.  The differential diagnosis includes severe electrolyte derangement, hyperkalemia, volume overload.  Pertinent Labs I ordered, reviewed, and interpreted labs, which included CBC, BMP, which I notable for potassium of 4.4.  Creatinine is 10.9.  0.5.  Imaging Interpretation I ordered imaging studies which included chest x-ray.  I independently visualized and interpreted the chest x-ray, which showed no evidence of volume overload.   Reassessments After the interventions stated above, I reevaluated the patient and found stable for discharge and follow-up on Monday with his dialysis center.  Patient does not meet criteria for emergent dialysis tonight.  He appears stable.  Patient agrees with the plan.  Final Clinical Impression(s) / ED Diagnoses Final diagnoses:  ESRD on dialysis [facility])    Rx / DC Orders ED Discharge Orders    None       Montine Circle, PA-C 09/25/19 [place], [place], DO 09/25/19 1502

## 2019-09-25 NOTE — Discharge Instructions (Addendum)
Your labs indicated that you do NOT need emergent dialysis tonight.  Please follow-up with your dialysis center on Monday.

## 2019-09-27 ENCOUNTER — Other Ambulatory Visit: Payer: Self-pay

## 2019-09-27 ENCOUNTER — Emergency Department (HOSPITAL_COMMUNITY): Payer: Medicare Other

## 2019-09-27 ENCOUNTER — Encounter (HOSPITAL_COMMUNITY): Payer: Self-pay | Admitting: Emergency Medicine

## 2019-09-27 ENCOUNTER — Emergency Department (HOSPITAL_COMMUNITY)
Admission: EM | Admit: 2019-09-27 | Discharge: 2019-09-28 | Disposition: A | Discharge status: Home / Self Care | Payer: Medicare Other | Attending: Emergency Medicine | Admitting: Emergency Medicine

## 2019-09-27 DIAGNOSIS — H539 Unspecified visual disturbance: Secondary | ICD-10-CM

## 2019-09-27 DIAGNOSIS — Z79899 Other long term (current) drug therapy: Secondary | ICD-10-CM | POA: Diagnosis not present

## 2019-09-27 DIAGNOSIS — N184 Chronic kidney disease, stage 4 (severe): Secondary | ICD-10-CM | POA: Insufficient documentation

## 2019-09-27 DIAGNOSIS — Z87891 Personal history of nicotine dependence: Secondary | ICD-10-CM | POA: Insufficient documentation

## 2019-09-27 DIAGNOSIS — I1310 Hypertensive heart and chronic kidney disease without heart failure, with stage 1 through stage 4 chronic kidney disease, or unspecified chronic kidney disease: Secondary | ICD-10-CM | POA: Insufficient documentation

## 2019-09-27 DIAGNOSIS — R0602 Shortness of breath: Secondary | ICD-10-CM

## 2019-09-27 LAB — CBC
HCT: 35.2 % — ABNORMAL LOW (ref 39.0–52.0)
Hemoglobin: 11.9 g/dL — ABNORMAL LOW (ref 13.0–17.0)
MCH: 30.9 pg (ref 26.0–34.0)
MCHC: 33.8 g/dL (ref 30.0–36.0)
MCV: 91.4 fL (ref 80.0–100.0)
Platelets: 179 10*3/uL (ref 150–400)
RBC: 3.85 MIL/uL — ABNORMAL LOW (ref 4.22–5.81)
RDW: 13.2 % (ref 11.5–15.5)
WBC: 4 10*3/uL (ref 4.0–10.5)
nRBC: 0 % (ref 0.0–0.2)

## 2019-09-27 LAB — BASIC METABOLIC PANEL
Anion gap: 12 (ref 5–15)
BUN: 37 mg/dL — ABNORMAL HIGH (ref 6–20)
CO2: 25 mmol/L (ref 22–32)
Calcium: 9.1 mg/dL (ref 8.9–10.3)
Chloride: 102 mmol/L (ref 98–111)
Creatinine, Ser: 8.17 mg/dL — ABNORMAL HIGH (ref 0.61–1.24)
GFR calc Af Amer: 9 mL/min — ABNORMAL LOW (ref >60)
GFR calc non Af Amer: 8 mL/min — ABNORMAL LOW (ref >60)
Glucose, Bld: 83 mg/dL (ref 70–99)
Potassium: 4.1 mmol/L (ref 3.5–5.1)
Sodium: 139 mmol/L (ref 135–145)

## 2019-09-27 NOTE — ED Triage Notes (Signed)
Pt endorses SOB ever since he has been going to dialysis. States his right eye has been flashing lights for 2 months but worsened yesterday.  Completed full HD yesterday. Pt HOH.

## 2019-09-27 NOTE — ED Notes (Signed)
Pt called for a room, no answer. Staff looked outside for the pt because he kept going in and out, could not locate the pt.

## 2019-09-27 NOTE — ED Notes (Signed)
Found pt. Pt states he is hard of hearing.

## 2019-09-27 NOTE — Discharge Instructions (Addendum)
Please call the phone number that I have provided you with to follow-up with the ophthalmologist Dr. Katy Fitch.   I also recommend that you follow-up with your primary care doctor for further evaluation of your chest pain, shortness of breath and continued care.

## 2019-09-27 NOTE — ED Notes (Signed)
Called several times to be triaged and unable to locate pt in lobby.

## 2019-09-27 NOTE — ED Notes (Signed)
Pt 20/20 bilateral eyes

## 2019-09-27 NOTE — ED Provider Notes (Signed)
[facility] EMERGENCY DEPARTMENT Provider Note   CSN: [id number] Arrival date & time: 09/27/19  1253     History Chief Complaint  Patient presents with  . vision trouble  . Shortness of Breath    [name] is a 34 y.o. male.  HPI Patient is a 34 year old male with history of bipolar 1, depression, difficulty hearing in bilateral ears, hypertension and CKD on Monday Wednesday Friday dialysis.  Patient also has Alport syndrome.   Patient is 34 year old male presented today with shortness of breath has been ongoing for 7 months.  He also states that he has had some tightness in his chest which he states he does not feel now.  He denies any chest pain, nausea, diaphoresis.  He states he does not feel short of breath at this time but states that he notices it when he is walking upstairs or exerting himself.  He denies any orthopnea, PND, lower extremity swelling.  Denies any calf tenderness, history of blood clots, heart palpitations, recent surgeries or immobilizations.  Patient states that he has also had 2 months of intermittent, brief episodes of vision loss in his right eye.  Patient denies any pain in his eye.  Patient states he does not wear contacts, glasses or have vision issues.  He denies any floaters, lightheadedness, dizziness, loss of peripheral vision or headaches.  He denies any family history of retinal detachment.      Past Medical History:  Diagnosis Date  . Bipolar 1 disorder ([place])   . Depression   . GERD (gastroesophageal reflux disease)   . Hearing difficulty of left ear    75% hearing  . 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  . Hypertension   . Renal disorder     Patient Active Problem List   Diagnosis Date Noted  . CKD (chronic kidney disease) 03/06/2019  . Dehydration 06/07/2018  . Hypertension 06/07/2018  . CKD (chronic kidney disease), stage IV ([place]) 06/07/2018  . Nausea and vomiting 06/07/2018  . Alport syndrome  07/14/2017  . GERD (gastroesophageal reflux disease) 07/14/2017  . History of appendectomy 07/14/2017  . Obesity (BMI 30.0-34.9) 07/14/2017  . Pre-transplant evaluation for kidney transplant 07/14/2017  . Secondary hyperparathyroidism ([place]) 07/14/2017  . Tobacco use 07/14/2017  . Unspecified hypertensive kidney disease with chronic kidney disease stage I through stage IV, or unspecified(403.90) 07/14/2017  . Bipolar 1 disorder ([place]) 09/01/2013  . 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 APPENDECTOMY    . AV FISTULA PLACEMENT Left 03/03/2019   Procedure: ARTERIOVENOUS (AV) FISTULA CREATION LEFT ARM;  Surgeon: Waynetta Sandy, MD;  Location: [facility];  Service: Vascular;  Laterality: Left;  . BASCILIC VEIN TRANSPOSITION Right 04/12/2019   Procedure: RIGHT UPPER EXTREMITY BASCILIC VEIN TRANSPOSITION FIRST STAGE FISTULA;  Surgeon: Angelia Mould, MD;  Location: [facility];  Service: Vascular;  Laterality: Right;  . LIGATION OF ARTERIOVENOUS  FISTULA Left 03/06/2019   Procedure: LIGATION OF ARTERIOVENOUS  FISTULA;  Surgeon: Marty Heck, MD;  Location: [facility];  Service: Vascular;  Laterality: Left;  . spinal tap    . WISDOM TOOTH EXTRACTION         Family History  Problem Relation Age of Onset  . Hypertension Mother   . Kidney failure Mother     Social History   Tobacco Use  . Smoking status: Former Smoker    Packs/day: 0.30  Types: Cigarettes    Quit date: 02/27/2016    Years since quitting: 3.5  . Smokeless tobacco: Never Used  Substance Use Topics  . Alcohol use: Not Currently    Comment: rare  . Drug use: No    Home Medications Prior to Admission medications   Medication Sig Start Date End Date Taking? Authorizing Provider  amLODipine (NORVASC) 10 MG tablet Take 10 mg by mouth daily. 08/24/19  Yes [provider]  AURYXIA 1 GM 210 MG(Fe) tablet Take 210 mg by mouth 5 (five) times daily.  07/26/19  Yes [provider]  calcitRIOL (ROCALTROL) 0.25 MCG capsule Take 0.25 mcg by mouth daily.   Yes [provider]  HYDROcodone-acetaminophen (NORCO) 5-325 MG tablet Take 1 tablet by mouth every 6 (six) hours as needed for moderate pain. Patient not taking: Reported on 09/24/2019 04/12/19   Edward Earing, PA-C  risperiDONE (RISPERDAL) 1 MG tablet Take 1 tablet (1 mg total) by mouth at bedtime. Patient not taking: Reported on 04/04/2019 12/21/18 12/21/19  Kathlee Nations, MD    Allergies    Nsaids and Other  Review of Systems   Review of Systems  Constitutional: Negative for chills and fever.  HENT: Negative for congestion.   Eyes: Positive for visual disturbance. Negative for pain.  Respiratory: Positive for chest tightness. Negative for cough and shortness of breath.   Cardiovascular: Negative for chest pain and leg swelling.  Gastrointestinal: Negative for abdominal pain, nausea and vomiting.  Genitourinary: Negative for dysuria.  Musculoskeletal: Negative for myalgias.  Skin: Negative for rash.  Neurological: Negative for dizziness, weakness, light-headedness, numbness and headaches.    Physical Exam Updated Vital Signs BP 134/84 (BP Location: Right Arm)   Pulse 69   Temp 98.5 F (36.9 C) (Oral)   Resp 18   Ht 5\' 8"  (1.727 m)   Wt 79.4 kg   SpO2 98%   BMI 26.61 kg/m   Physical Exam Vitals and nursing note reviewed.  Constitutional:      General: He is not in acute distress. HENT:     Head: Normocephalic and atraumatic.     Nose: Nose normal.     Mouth/Throat:     Mouth: Mucous membranes are moist.  Eyes:     General: No scleral icterus.    Extraocular Movements: Extraocular movements intact.     Pupils: Pupils are equal, round, and reactive to light.  Cardiovascular:     Rate and Rhythm: Normal rate and regular rhythm.     Pulses: Normal pulses.     Heart sounds: Normal heart sounds.  Pulmonary:     Effort: Pulmonary effort is normal.  No respiratory distress.     Breath sounds: No wheezing.  Abdominal:     Palpations: Abdomen is soft.     Tenderness: There is no abdominal tenderness. There is no guarding or rebound.  Musculoskeletal:     Cervical back: Normal range of motion.     Right lower leg: No edema.     Left lower leg: No edema.  Skin:    General: Skin is warm and dry.     Capillary Refill: Capillary refill takes less than 2 seconds.  Neurological:     Mental Status: [name]. Mental status is at baseline.     Comments: Alert and oriented to self, place, time and event.   Speech is fluent, clear without dysarthria or dysphasia.   Strength 5/5 in upper/lower extremities  Sensation intact in upper/lower  extremities   Normal gait.  Negative Romberg. No pronator drift.  Normal finger-to-nose and feet tapping.  CN I not tested  CN II grossly intact visual fields bilaterally. Did not visualize posterior eye.   CN III, IV, VI PERRLA and EOMs intact bilaterally  CN V Intact sensation to sharp and light touch to the face  CN VII facial movements symmetric  CN VIII not tested  CN IX, X no uvula deviation, symmetric rise of soft palate  CN XI 5/5 SCM and trapezius strength bilaterally  CN XII Midline tongue protrusion, symmetric L/R movements   Visual acuity is 20/20 in both eyes.  At close range in long distance.  This was obtained by nursing staff.  Psychiatric:        Mood and Affect: Mood normal.        Behavior: Behavior normal.     ED Results / Procedures / Treatments   Labs (all labs ordered are listed, but only abnormal results are displayed) Labs Reviewed  CBC - Abnormal; Notable for the following components:      Result Value   RBC 3.85 (*)    Hemoglobin 11.9 (*)    HCT 35.2 (*)    All other components within normal limits  BASIC METABOLIC PANEL - Abnormal; Notable for the following components:   BUN 37 (*)    Creatinine, Ser 8.17 (*)    GFR calc non Af Amer 8 (*)    GFR calc Af  Amer 9 (*)    All other components within normal limits    EKG EKG Interpretation  Date/Time:  Tuesday Sep 27 2019 13:14:38 EDT Ventricular Rate:  80 PR Interval:  154 QRS Duration: 92 QT Interval:  384 QTC Calculation: 442 R Axis:   60 Text Interpretation: Normal sinus rhythm Normal ECG No significant change since prior 8/20 Confirmed by Aletta Edouard [phone]) on 09/27/2019 11:22:34 PM   Radiology DG Chest 2 View  Result Date: 09/27/2019 CLINICAL DATA:  Shortness of breath. EXAM: CHEST - 2 VIEW COMPARISON:  Sep 24, 2019 FINDINGS: The heart size and mediastinal contours are within normal limits. Both lungs are clear. The visualized skeletal structures are unremarkable. IMPRESSION: No active cardiopulmonary disease. Electronically Signed   By: Virgina Norfolk M.D.   On: 09/27/2019 17:05    Procedures Procedures (including critical care time)  Medications Ordered in ED Medications - No data to display  ED Course  I have reviewed the triage vital signs and the nursing notes.  Pertinent labs & imaging results that were available during my care of the patient were reviewed by me and considered in my medical decision making (see chart for details).  Patient presented today with complaint of shortness of breath for 7 months, intermittent flashing in and out of the right visual field that lasts seconds there is ongoing for 2 months.  He also complains of chest tightness.  He denies any frank chest pain.  Patient has a history of Alport syndrome and is on dialysis as a result of this.  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  He is neurologically intact on my examination and has 20/20 vision.  He has had no episodes of vision flashing in and out since he was brought back to the ED room.  Given his symptoms I discussed the case with my attending physician who recommended ophthalmology consultation for close follow-up.  I discussed case  with Dr. Katy Fitch of ophthalmology who  recommended follow-up closely in the morning with him.  Patient has dialysis the morning but states that he will call at 8 AM and make plan to see ophthalmology in clinic immediately after his dialysis.  I discussed this case with Dr. Katy Fitch and appreciate his expert consultation.  Amniocentesis asked was discussed as a differential however patient is young, healthy apart from his Allport syndrome and associated diseases and I have low suspicion for this being a embolic issue.  Clinical Course as of Sep 28 98  Tue Sep 27, 2019  2304 BMP without acute abnormality.  Patient has baseline creatinine and BUN.  No electrolyte abnormalities.  CBC without leukocytosis.  Chronic anemia which is likely secondary to his CKD.   [WF]  2305 2 view chest x-ray with independently reviewed by myself.  No pulmonary edema or acute abnormalities.   [WF]  2306 EKG independently viewed by myself.  There is evidence of LVH with large R waves in lateral v leads.  No other acute abnormalities.  No changes from prior EKG.   [WF]    Clinical Course User Index [WF] Tedd Sias, [place]   I discussed this case with my attending physician who cosigned this note including patient's presenting symptoms, physical exam, and planned diagnostics and interventions. Attending physician stated agreement with plan or made changes to plan which were implemented.   Discharge patient at this time.  He will follow up in the morning with ophthalmology.  MDM Rules/Calculators/A&P                      Final Clinical Impression(s) / ED Diagnoses Final diagnoses:  SOB (shortness of breath)  Vision abnormalities    Rx / DC Orders ED Discharge Orders    None       Tedd Sias, [place] 09/29/19 0101    Hayden Rasmussen, MD 09/29/19 [phone]

## 2019-09-27 NOTE — ED Notes (Signed)
The pt keeps leaving in and out of the waiting room. Continues to not be able to be found to be roomed, and then will come back in.

## 2019-09-27 NOTE — ED Notes (Signed)
Pt came back to ED, pt name has been put back into [facility]

## 2019-10-20 ENCOUNTER — Other Ambulatory Visit (HOSPITAL_COMMUNITY): Payer: Self-pay | Admitting: Psychiatry

## 2019-10-20 DIAGNOSIS — F319 Bipolar disorder, unspecified: Secondary | ICD-10-CM

## 2019-10-20 DIAGNOSIS — F1220 Cannabis dependence, uncomplicated: Secondary | ICD-10-CM

## 2019-10-22 ENCOUNTER — Other Ambulatory Visit (HOSPITAL_COMMUNITY): Payer: Self-pay | Admitting: Psychiatry

## 2019-10-22 DIAGNOSIS — F1220 Cannabis dependence, uncomplicated: Secondary | ICD-10-CM

## 2019-10-22 DIAGNOSIS — F319 Bipolar disorder, unspecified: Secondary | ICD-10-CM

## 2019-10-26 ENCOUNTER — Emergency Department (HOSPITAL_COMMUNITY): Payer: Medicare Other

## 2019-10-26 ENCOUNTER — Other Ambulatory Visit: Payer: Self-pay

## 2019-10-26 ENCOUNTER — Emergency Department (HOSPITAL_COMMUNITY)
Admission: EM | Admit: 2019-10-26 | Discharge: 2019-10-26 | Disposition: A | Discharge status: Home / Self Care | Payer: Medicare Other | Attending: Emergency Medicine | Admitting: Emergency Medicine

## 2019-10-26 DIAGNOSIS — Z87891 Personal history of nicotine dependence: Secondary | ICD-10-CM | POA: Insufficient documentation

## 2019-10-26 DIAGNOSIS — I120 Hypertensive chronic kidney disease with stage 5 chronic kidney disease or end stage renal disease: Secondary | ICD-10-CM | POA: Insufficient documentation

## 2019-10-26 DIAGNOSIS — E162 Hypoglycemia, unspecified: Secondary | ICD-10-CM

## 2019-10-26 DIAGNOSIS — F419 Anxiety disorder, unspecified: Secondary | ICD-10-CM

## 2019-10-26 DIAGNOSIS — Z79899 Other long term (current) drug therapy: Secondary | ICD-10-CM | POA: Diagnosis not present

## 2019-10-26 DIAGNOSIS — R0602 Shortness of breath: Secondary | ICD-10-CM | POA: Diagnosis present

## 2019-10-26 DIAGNOSIS — Z992 Dependence on renal dialysis: Secondary | ICD-10-CM | POA: Diagnosis not present

## 2019-10-26 DIAGNOSIS — N186 End stage renal disease: Secondary | ICD-10-CM | POA: Diagnosis not present

## 2019-10-26 LAB — BASIC METABOLIC PANEL
Anion gap: 14 (ref 5–15)
BUN: 38 mg/dL — ABNORMAL HIGH (ref 6–20)
CO2: 20 mmol/L — ABNORMAL LOW (ref 22–32)
Calcium: 9.4 mg/dL (ref 8.9–10.3)
Chloride: 108 mmol/L (ref 98–111)
Creatinine, Ser: 11.18 mg/dL — ABNORMAL HIGH (ref 0.61–1.24)
GFR calc Af Amer: 6 mL/min — ABNORMAL LOW (ref >60)
GFR calc non Af Amer: 5 mL/min — ABNORMAL LOW (ref >60)
Glucose, Bld: 68 mg/dL — ABNORMAL LOW (ref 70–99)
Potassium: 4 mmol/L (ref 3.5–5.1)
Sodium: 142 mmol/L (ref 135–145)

## 2019-10-26 LAB — CBC WITH DIFFERENTIAL/PLATELET
Abs Immature Granulocytes: 0.01 10*3/uL (ref 0.00–0.07)
Basophils Absolute: 0.1 10*3/uL (ref 0.0–0.1)
Basophils Relative: 1 %
Eosinophils Absolute: 0.1 10*3/uL (ref 0.0–0.5)
Eosinophils Relative: 3 %
HCT: 36.5 % — ABNORMAL LOW (ref 39.0–52.0)
Hemoglobin: 11.7 g/dL — ABNORMAL LOW (ref 13.0–17.0)
Immature Granulocytes: 0 %
Lymphocytes Relative: 27 %
Lymphs Abs: 1 10*3/uL (ref 0.7–4.0)
MCH: 31.2 pg (ref 26.0–34.0)
MCHC: 32.1 g/dL (ref 30.0–36.0)
MCV: 97.3 fL (ref 80.0–100.0)
Monocytes Absolute: 0.3 10*3/uL (ref 0.1–1.0)
Monocytes Relative: 9 %
Neutro Abs: 2.2 10*3/uL (ref 1.7–7.7)
Neutrophils Relative %: 60 %
Platelets: 169 10*3/uL (ref 150–400)
RBC: 3.75 MIL/uL — ABNORMAL LOW (ref 4.22–5.81)
RDW: 12.9 % (ref 11.5–15.5)
WBC: 3.7 10*3/uL — ABNORMAL LOW (ref 4.0–10.5)
nRBC: 0 % (ref 0.0–0.2)

## 2019-10-26 NOTE — ED Provider Notes (Signed)
[facility] EMERGENCY DEPARTMENT Provider Note   CSN: [id number] Arrival date & time: 10/26/19  0913     History Chief Complaint  Patient presents with  . Shortness of Breath    [name] is a 34 y.o. male.  Patient is a 34 year old male with a history of bipolar disease, chronic kidney disease now on dialysis for the last 2 months, Alport syndrome who is presenting today with multiple complaints.  Patient reports that when he woke up this morning he was feeling generally weak everywhere, chills, sensation of shortness of breath and mild anxiety that he reports washed over him.  Currently he states he still feels weak and tired but denies any shortness of breath at this time.  He has not had cough, congestion, diarrhea or vomiting.  Last dialysis was on Monday and he completed a full course.  Patient states he has been having intermittent episodes like this since he started dialysis and he is not sure what is going on.  He has not been vaccinated against Covid but also reports he has had no recent sick contacts or anyone he has been around that he has known Covid.  He has had no recent medication changes.  The history is provided by the patient.  Shortness of Breath Severity:  Moderate Onset quality:  Gradual Timing:  Intermittent Progression:  Resolved Chronicity:  Recurrent      Past Medical History:  Diagnosis Date  . Bipolar 1 disorder ([place])   . Depression   . GERD (gastroesophageal reflux disease)   . Hearing difficulty of left ear    75% hearing  . 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  . Hypertension   . Renal disorder     Patient Active Problem List   Diagnosis Date Noted  . CKD (chronic kidney disease) 03/06/2019  . Dehydration 06/07/2018  . Hypertension 06/07/2018  . CKD (chronic kidney disease), stage IV ([place]) 06/07/2018  . Nausea and vomiting 06/07/2018  . Alport syndrome 07/14/2017  . GERD (gastroesophageal reflux disease)  07/14/2017  . History of appendectomy 07/14/2017  . Obesity (BMI 30.0-34.9) 07/14/2017  . Pre-transplant evaluation for kidney transplant 07/14/2017  . Secondary hyperparathyroidism ([place]) 07/14/2017  . Tobacco use 07/14/2017  . Unspecified hypertensive kidney disease with chronic kidney disease stage I through stage IV, or unspecified(403.90) 07/14/2017  . Bipolar 1 disorder ([place]) 09/01/2013  . 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 APPENDECTOMY    . AV FISTULA PLACEMENT Left 03/03/2019   Procedure: ARTERIOVENOUS (AV) FISTULA CREATION LEFT ARM;  Surgeon: Waynetta Sandy, MD;  Location: [facility];  Service: Vascular;  Laterality: Left;  . BASCILIC VEIN TRANSPOSITION Right 04/12/2019   Procedure: RIGHT UPPER EXTREMITY BASCILIC VEIN TRANSPOSITION FIRST STAGE FISTULA;  Surgeon: Angelia Mould, MD;  Location: [facility];  Service: Vascular;  Laterality: Right;  . LIGATION OF ARTERIOVENOUS  FISTULA Left 03/06/2019   Procedure: LIGATION OF ARTERIOVENOUS  FISTULA;  Surgeon: Marty Heck, MD;  Location: [facility];  Service: Vascular;  Laterality: Left;  . spinal tap    . WISDOM TOOTH EXTRACTION         Family History  Problem Relation Age of Onset  . Hypertension Mother   . Kidney failure Mother     Social History   Tobacco Use  . Smoking status: Former Smoker    Packs/day: 0.30    Types: Cigarettes  Quit date: 02/27/2016    Years since quitting: 3.6  . Smokeless tobacco: Never Used  Vaping Use  . Vaping Use: Never used  Substance Use Topics  . Alcohol use: Not Currently    Comment: rare  . Drug use: No    Home Medications Prior to Admission medications   Medication Sig Start Date End Date Taking? Authorizing Provider  amLODipine (NORVASC) 10 MG tablet Take 10 mg by mouth daily. 08/24/19   [provider]  AURYXIA 1 GM 210 MG(Fe) tablet Take 210 mg by mouth 5 (five) times daily. 07/26/19   [provider]  calcitRIOL (ROCALTROL) 0.25 MCG capsule Take 0.25 mcg by mouth daily.    [provider]  HYDROcodone-acetaminophen (NORCO) 5-325 MG tablet Take 1 tablet by mouth every 6 (six) hours as needed for moderate pain. Patient not taking: Reported on 09/24/2019 04/12/19   Gabriel Earing, PA-C  risperiDONE (RISPERDAL) 1 MG tablet Take 1 tablet (1 mg total) by mouth at bedtime. Patient not taking: Reported on 04/04/2019 12/21/18 12/21/19  Kathlee Nations, MD    Allergies    Nsaids and Other  Review of Systems   Review of Systems  Respiratory: Positive for shortness of breath.   All other systems reviewed and are negative.   Physical Exam Updated Vital Signs BP 138/78 (BP Location: Left Arm)   Pulse 98   Temp 99 F (37.2 C) (Oral)   Resp (!) 25   SpO2 99%   Physical Exam Vitals and nursing note reviewed.  Constitutional:      General: He is not in acute distress.    Appearance: He is well-developed.  HENT:     Head: Normocephalic and atraumatic.     Mouth/Throat:     Mouth: Mucous membranes are moist.  Eyes:     Conjunctiva/sclera: Conjunctivae normal.     Pupils: Pupils are equal, round, and reactive to light.  Cardiovascular:     Rate and Rhythm: Normal rate and regular rhythm.     Pulses: Normal pulses.     Heart sounds: No murmur heard.   Pulmonary:     Effort: Pulmonary effort is normal. No respiratory distress.     Breath sounds: Normal breath sounds. No wheezing or rales.  Abdominal:     General: Abdomen is flat. Bowel sounds are normal. There is no distension.     Palpations: Abdomen is soft.     Tenderness: There is no abdominal tenderness. There is no guarding or rebound.  Musculoskeletal:        General: No tenderness. Normal range of motion.     Cervical back: Normal range of motion and neck supple.     Comments: Fistula present in the right upper extremity with palpable thrill.  Nontender and no evidence of erythema  Skin:    General:  Skin is warm and dry.     Findings: No erythema or rash.  Neurological:     General: No focal deficit present.     Mental Status: [name] and oriented to person, place, and time. Mental status is at baseline.  Psychiatric:        Mood and Affect: Mood normal.        Behavior: Behavior normal.        Thought Content: Thought content normal.     ED Results / Procedures / Treatments   Labs (all labs ordered are listed, but only abnormal results are displayed) Labs Reviewed  BASIC METABOLIC  PANEL - Abnormal; Notable for the following components:      Result Value   CO2 20 (*)    Glucose, Bld 68 (*)    BUN 38 (*)    Creatinine, Ser 11.18 (*)    GFR calc non Af Amer 5 (*)    GFR calc Af Amer 6 (*)    All other components within normal limits  CBC WITH DIFFERENTIAL/PLATELET - Abnormal; Notable for the following components:   WBC 3.7 (*)    RBC 3.75 (*)    Hemoglobin 11.7 (*)    HCT 36.5 (*)    All other components within normal limits    EKG EKG Interpretation  Date/Time:  Wednesday October 26 2019 09:19:31 EDT Ventricular Rate:  101 PR Interval:    QRS Duration: 82 QT Interval:  342 QTC Calculation: 444 R Axis:   70 Text Interpretation: Sinus tachycardia Probable left atrial enlargement RSR' in V1 or V2, probably normal variant Probable left ventricular hypertrophy Nonspecific T abnormalities, lateral leads No significant change since last tracing Confirmed by Blanchie Dessert [phone]) on 10/26/2019 9:48:45 AM   Radiology DG Chest Port 1 View  Result Date: 10/26/2019 CLINICAL DATA:  34 year old male with history of shortness of breath. EXAM: PORTABLE CHEST 1 VIEW COMPARISON:  Chest x-ray 09/27/2019. FINDINGS: Lung volumes are normal. No consolidative airspace disease. No pleural effusions. No pneumothorax. No pulmonary nodule or mass noted. Pulmonary vasculature and the cardiomediastinal silhouette are within normal limits. IMPRESSION: No radiographic evidence of acute  cardiopulmonary disease. Electronically Signed   By: Vinnie Langton M.D.   On: 10/26/2019 10:39    Procedures Procedures (including critical care time)  Medications Ordered in ED Medications - No data to display  ED Course  I have reviewed the triage vital signs and the nursing notes.  Pertinent labs & imaging results that were available during my care of the patient were reviewed by me and considered in my medical decision making (see chart for details).    MDM Rules/Calculators/A&P                          34 year old male presenting today with very symptoms of shortness of breath, feeling hot and chilled and generally weak and tired.  Patient states this has been happening intermittently since starting dialysis 2 months ago.  Patient did lyse have a full course of dialysis on Monday but reports because of the way he felt this morning he did not go to dialysis.  Patient does not appear significantly fluid overloaded.  He denies any shortness of breath at this time but was requesting to be checked for diabetes because he states he usually feels better after he eats when he feels this way.  Patient's vital signs are reassuring lungs are clear and no significant external findings on his exam.  Will check and ensure electrolytes are within normal limits.  EKG without acute findings and chest x-ray without signs of fluid overload.  Patient is satting 99% on room air.  He denies any chest pain and low suspicion that this is PE given its recurrent nature.  Also discussed could be anxiety related to recent changes from starting dialysis.  Low suspicion for Covid, pneumonia or bacteremia at this time given patient's symptoms and well appearance. 11:44 AM Labs without significant findings except for a blood sugar of 68 but patient has not had anything to eat today.  White cell count and hemoglobin are at baseline.  Chest x-ray again without acute findings.  Findings discussed with the patient and he  was given something to eat.  We will arrange for him to go to dialysis from the emergency room.  Also patient is requesting treatment for his worsening anxiety and encouraged him to follow-up with PCP or nephrologist for possible long-term treatment for anxiety.  MDM Number of Diagnoses or Management Options   Amount and/or Complexity of Data Reviewed Clinical lab tests: ordered and reviewed Tests in the radiology section of CPT: ordered and reviewed Tests in the medicine section of CPT: ordered and reviewed Decide to obtain previous medical records or to obtain history from someone other than the patient: yes Obtain history from someone other than the patient: no Review and summarize past medical records: yes Independent visualization of images, tracings, or specimens: yes  Risk of Complications, Morbidity, and/or Mortality Presenting problems: moderate Diagnostic procedures: low Management options: low  Patient Progress Patient progress: stable   Final Clinical Impression(s) / ED Diagnoses Final diagnoses:  Anxiety  Hypoglycemia    Rx / DC Orders ED Discharge Orders    None       Blanchie Dessert, MD 10/26/19 1146

## 2019-10-26 NOTE — Discharge Instructions (Signed)
Your blood sugar was slightly low here so make sure you have a snack when you start feeling this way you can always eat a snack and make sure you do not go for long periods of time without meals.

## 2019-10-26 NOTE — Discharge Planning (Signed)
RNCM consulted regarding pt needing to get HD today.  Will confirm with Renal Navigator.

## 2019-10-26 NOTE — Progress Notes (Signed)
Renal Navigator received message from CM/C. Wood that patient is in the ED, cleared for discharge and has missed HD today due to ED visit. Patient's OP HD clinic is [facility] on [address]. Clinic can fit patient in if he reports there now, straight from ED discharge. Navigator spoke with patient who states willingness to go straight to HD clinic today, but does not have a ride who can get him there immediately. Navigator provided cab voucher, and asked patient if he can confirm that he has someone to pick him up after treatment from his clinic. He called someone while Navigator was in his room and confirmed that he has a ride home.  Navigator faxed EDP note to clinic and gave filled out cab voucher to RN. Navigator updated EDP.  Alphonzo Cruise, [place] Renal Navigator [phone]

## 2019-10-26 NOTE — ED Triage Notes (Addendum)
Pt missed dialysis this morning d/t feeling weak. Pt then called EMS from his car outside his house for eval of shob. SpO2 100%. Keeps saying he's "going to have an episode." Also endorses intermittent L arm numbness, hot flashes, and LUQ abdominal pain. Pt HOH.

## 2019-11-30 ENCOUNTER — Telehealth: Payer: Self-pay

## 2019-11-30 NOTE — Telephone Encounter (Signed)
Pt c/o numbness in L arm and thumb since surgery 12/20. He is scheduled for dialysis access study and to see MD after. Pt verbalized understanding.

## 2019-12-07 ENCOUNTER — Ambulatory Visit (HOSPITAL_COMMUNITY)
Admission: RE | Admit: 2019-12-07 | Discharge: 2019-12-07 | Disposition: A | Discharge status: Home / Self Care | Payer: Medicare Other | Source: Ambulatory Visit | Attending: Vascular Surgery | Admitting: Vascular Surgery

## 2019-12-07 ENCOUNTER — Other Ambulatory Visit: Payer: Self-pay

## 2019-12-07 DIAGNOSIS — N184 Chronic kidney disease, stage 4 (severe): Secondary | ICD-10-CM | POA: Insufficient documentation

## 2019-12-14 ENCOUNTER — Encounter: Payer: Medicaid Other | Admitting: Vascular Surgery

## 2019-12-16 ENCOUNTER — Encounter: Payer: Self-pay | Admitting: Family Medicine

## 2019-12-16 ENCOUNTER — Other Ambulatory Visit: Payer: Self-pay

## 2019-12-16 ENCOUNTER — Ambulatory Visit (INDEPENDENT_AMBULATORY_CARE_PROVIDER_SITE_OTHER): Payer: Medicare Other | Admitting: Family Medicine

## 2019-12-16 ENCOUNTER — Ambulatory Visit: Payer: Medicare Other | Admitting: Family Medicine

## 2019-12-16 DIAGNOSIS — R21 Rash and other nonspecific skin eruption: Secondary | ICD-10-CM | POA: Insufficient documentation

## 2019-12-16 MED ORDER — TRIAMCINOLONE ACETONIDE 0.1 % EX OINT: 1.0000 "application " | TOPICAL_OINTMENT | Freq: Two times a day (BID) | CUTANEOUS | 2 refills | Status: DC

## 2019-12-16 NOTE — Progress Notes (Signed)
SUBJECTIVE:   CHIEF COMPLAINT / HPI:   Mr. Chatterjee presents to clinic today to establish care.  His medical history was reviewed and filled out through the history tab.  He also want to discuss the following problems:  Leg rash He notes that he has had a rash on his left lower leg for some time.  This rash can be itchy as often dried and scaling.  It feels like it gets particularly bad if he puts lotion on it because it then dries out worse.  Would like to know what more can be done for this particular rash.  He is not aware of any obvious drainage although it does sometimes bleed when he scratches.  Scrotal rash He notes that he has had irritation on his scrotum and penis for over a year now.  He has small bumps on his scrotum and penis and has been to a previous doctor about this issue multiple times but has not been able to find any significant relief.  He has not noted any drainage or bleeding from this rash.  Does not have any penile pain or penile drainage.  He is sexually active.  PERTINENT  PMH / PSH: Alport syndrome, ESRD, bipolar 1 disorder  OBJECTIVE:   BP 140/80    Pulse 77    Ht 5\' 8"  (1.727 m)    Wt 178 lb (80.7 kg)    SpO2 98%    BMI 27.06 kg/m    General: Alert and cooperative and appears to be in no acute distress.  He speaks with a moderate lisp and wears an earbud in his left ear which amplifies what he hears through the microphone on his phone.  He also notes that he has a preference for lipreading. HEENT: Neck non-tender without lymphadenopathy, masses or thyromegaly Cardio: Normal S1 and S2, no S3 or S4. Rhythm is regular.  3/6 systolic murmur. Pulm: Clear to auscultation bilaterally, no crackles, wheezing, or diminished breath sounds. Normal respiratory effort Abdomen: Bowel sounds normal. Abdomen soft and non-tender.  Extremities: No peripheral edema. Warm/ well perfused.  Left lower leg reveals a large patch which is roughly 3 cm x 11 cm dry, scaled skin with some  plaque formation.  There is some evidence of excoriation though no active bleeding or purulence.  No evidence of erythema.  Mildly violaceous. Groin: His scrotum and penis demonstrate a papular rash without evidence of vesicles or drainage.  There are some areas where the skin appears more raw than others.  These papules appear to be in various stages of healing and postinflammatory hyperpigmentation.  There is no significant ulceration on the scrotum or penile shaft.  No evidence of involvement of the glans.  No evidence of penile discharge. Neuro: Cranial nerves grossly intact   ASSESSMENT/PLAN:   Rash and nonspecific skin eruption Very low suspicion for infectious process.  More likely autoimmune.  Possible eczematous rash especially with a physical exam but also reveals potential eczema of the scrotum.  For now, will treat with topical triamcinolone ointment for 1-2 weeks and monitor for improvement.  If this is not provided any improvement we can reevaluate. -Triamcinolone ointment -Return to clinic if no improvement in 2 weeks  Rash on scrotum Low suspicion for STI based on appearance of this nodular, papular rash over scrotum and penis.  No evidence of significant ulceration suggestive of a chancre or infectious penile lesion.  After review of a textbook of genital rashes, seems most consistent with scrotal  eczema or scrotal scabies.  I have a low suspicion for scrotal scabies and so we will move forward with treatment for scrotal eczema. -Triamcinolone ointment daily for 1-2 weeks -Return to clinic in 2 weeks if no improvement     Matilde Haymaker, MD [place]

## 2019-12-16 NOTE — Patient Instructions (Addendum)
Shin rash: I think this might be an autoimmune rash that will get better with topical steroids.  The steroids will not affect your kidneys.  I want to put this ointment on your leg 2 times a day for at least 1 week.  If this makes your leg better, you only need to use it when the rash comes back.  If this does not make your leg better, please come back into clinic.  Scrotal pain/lesion: I am not positive what is causing this discomfort on your penis in your scrotum.  This may simply be eczema breaking out in your scrotum.  For now, I would like to start by putting a topical steroid on this rash to see what happens.  I am optimistic that this will significantly improve your rash.  Try using the triamcinolone ointment on your scrotum once daily for 1-2 weeks to see if this improves your rash.  If this does not lead to improvement, come back and will think of new options.

## 2019-12-16 NOTE — Assessment & Plan Note (Signed)
Very low suspicion for infectious process.  More likely autoimmune.  Possible eczematous rash especially with a physical exam but also reveals potential eczema of the scrotum.  For now, will treat with topical triamcinolone ointment for 1-2 weeks and monitor for improvement.  If this is not provided any improvement we can reevaluate. -Triamcinolone ointment -Return to clinic if no improvement in 2 weeks

## 2019-12-16 NOTE — Assessment & Plan Note (Signed)
Low suspicion for STI based on appearance of this nodular, papular rash over scrotum and penis.  No evidence of significant ulceration suggestive of a chancre or infectious penile lesion.  After review of a textbook of genital rashes, seems most consistent with scrotal eczema or scrotal scabies.  I have a low suspicion for scrotal scabies and so we will move forward with treatment for scrotal eczema. -Triamcinolone ointment daily for 1-2 weeks -Return to clinic in 2 weeks if no improvement

## 2020-01-11 ENCOUNTER — Encounter: Payer: Medicaid Other | Admitting: Vascular Surgery

## 2020-01-11 ENCOUNTER — Encounter (HOSPITAL_COMMUNITY): Payer: Medicaid Other

## 2020-02-01 ENCOUNTER — Encounter: Payer: Medicaid Other | Admitting: Vascular Surgery

## 2020-02-06 ENCOUNTER — Other Ambulatory Visit: Payer: Medicare Other

## 2020-02-06 DIAGNOSIS — Z20822 Contact with and (suspected) exposure to covid-19: Secondary | ICD-10-CM

## 2020-02-07 LAB — SARS-COV-2, NAA 2 DAY TAT

## 2020-02-07 LAB — NOVEL CORONAVIRUS, NAA: SARS-CoV-2, NAA: NOT DETECTED

## 2020-05-03 ENCOUNTER — Encounter (HOSPITAL_COMMUNITY): Payer: Self-pay

## 2020-05-03 ENCOUNTER — Emergency Department (HOSPITAL_COMMUNITY)
Admission: EM | Admit: 2020-05-03 | Discharge: 2020-05-03 | Disposition: A | Discharge status: Home / Self Care | Payer: Medicare Other | Attending: Emergency Medicine | Admitting: Emergency Medicine

## 2020-05-03 ENCOUNTER — Emergency Department (HOSPITAL_COMMUNITY): Payer: Medicare Other

## 2020-05-03 ENCOUNTER — Other Ambulatory Visit: Payer: Self-pay

## 2020-05-03 DIAGNOSIS — Z20822 Contact with and (suspected) exposure to covid-19: Secondary | ICD-10-CM | POA: Diagnosis not present

## 2020-05-03 DIAGNOSIS — Z992 Dependence on renal dialysis: Secondary | ICD-10-CM | POA: Diagnosis not present

## 2020-05-03 DIAGNOSIS — Z79899 Other long term (current) drug therapy: Secondary | ICD-10-CM | POA: Insufficient documentation

## 2020-05-03 DIAGNOSIS — E875 Hyperkalemia: Secondary | ICD-10-CM | POA: Diagnosis not present

## 2020-05-03 DIAGNOSIS — I120 Hypertensive chronic kidney disease with stage 5 chronic kidney disease or end stage renal disease: Secondary | ICD-10-CM | POA: Insufficient documentation

## 2020-05-03 DIAGNOSIS — N186 End stage renal disease: Secondary | ICD-10-CM | POA: Insufficient documentation

## 2020-05-03 DIAGNOSIS — Z87891 Personal history of nicotine dependence: Secondary | ICD-10-CM | POA: Diagnosis not present

## 2020-05-03 LAB — CBC
HCT: 38.1 % — ABNORMAL LOW (ref 39.0–52.0)
Hemoglobin: 12.2 g/dL — ABNORMAL LOW (ref 13.0–17.0)
MCH: 30.8 pg (ref 26.0–34.0)
MCHC: 32 g/dL (ref 30.0–36.0)
MCV: 96.2 fL (ref 80.0–100.0)
Platelets: 178 10*3/uL (ref 150–400)
RBC: 3.96 MIL/uL — ABNORMAL LOW (ref 4.22–5.81)
RDW: 13.2 % (ref 11.5–15.5)
WBC: 3.7 10*3/uL — ABNORMAL LOW (ref 4.0–10.5)
nRBC: 0 % (ref 0.0–0.2)

## 2020-05-03 LAB — BASIC METABOLIC PANEL
Anion gap: 10 (ref 5–15)
BUN: 84 mg/dL — ABNORMAL HIGH (ref 6–20)
CO2: 16 mmol/L — ABNORMAL LOW (ref 22–32)
Calcium: 8.7 mg/dL — ABNORMAL LOW (ref 8.9–10.3)
Chloride: 112 mmol/L — ABNORMAL HIGH (ref 98–111)
Creatinine, Ser: 11.28 mg/dL — ABNORMAL HIGH (ref 0.61–1.24)
GFR, Estimated: 6 mL/min — ABNORMAL LOW (ref >60)
Glucose, Bld: 86 mg/dL (ref 70–99)
Potassium: 5.6 mmol/L — ABNORMAL HIGH (ref 3.5–5.1)
Sodium: 138 mmol/L (ref 135–145)

## 2020-05-03 LAB — RESP PANEL BY RT-PCR (FLU A&B, COVID) ARPGX2
Influenza A by PCR: NEGATIVE
Influenza B by PCR: NEGATIVE
SARS Coronavirus 2 by RT PCR: NEGATIVE

## 2020-05-03 MED ORDER — SODIUM ZIRCONIUM CYCLOSILICATE 10 G PO PACK
10.0000 g | PACK | Freq: Once | ORAL | Status: AC
  Administered 2020-05-03: 10 g via ORAL
  Filled 2020-05-03: qty 1

## 2020-05-03 NOTE — Discharge Instructions (Addendum)
Seen here for lab check after missed dialysis treatment.  Lab work and imaging all look reassuring.  I recommend continuing with your home medications as prescribed.    Please follow-up tomorrow for your dialysis treatment  as you will need it.  Do not miss this appointment.  Come back to the emergency department if you develop chest pain, shortness of breath, severe abdominal pain, uncontrolled nausea, vomiting, diarrhea.

## 2020-05-03 NOTE — ED Provider Notes (Signed)
[facility] EMERGENCY DEPARTMENT Provider Note   CSN: [id number] Arrival date & time: 05/03/20  3536     History No chief complaint on file.   [name] is a 34 y.o. male.  HPI    Patient with significant medical history of bipolar, depression, GERD, end-stage renal disease secondary to Alport disease currently on hemodialysis, M, W, F started 1 year ago presents to the emergency department due to missed dialysis treatments.  Patient endorses last time he got dialysis was on 12/10, he endorses that he missed his prior due to technical difficulties at the facility and then unable to go due to work.  He has no complaints at this time and presents here because he cannot return back to hemodialysis until he is cleared by the emergency department.  Patient denies chest pain, shortness of breath, abdominal pain, nausea, vomiting, diarrhea, pedal edema.  He has had no difficulties with his fistula site, has been taking his medications as prescribed.   Past Medical History:  Diagnosis Date  . Bipolar 1 disorder ([place])   . Depression   . GERD (gastroesophageal reflux disease)   . Hearing difficulty of left ear    75% hearing  . 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  . Hypertension   . Renal disorder     Patient Active Problem List   Diagnosis Date Noted  . Rash and nonspecific skin eruption 12/16/2019  . Rash on scrotum 12/16/2019  . Dehydration 06/07/2018  . Hypertension 06/07/2018  . ESRD (end stage renal disease) ([place]) 06/07/2018  . Alport syndrome 07/14/2017  . GERD (gastroesophageal reflux disease) 07/14/2017  . History of appendectomy 07/14/2017  . Obesity (BMI 30.0-34.9) 07/14/2017  . Pre-transplant evaluation for kidney transplant 07/14/2017  . Secondary hyperparathyroidism ([place]) 07/14/2017  . Tobacco use 07/14/2017  . Bipolar 1 disorder ([place]) 09/01/2013  . 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 APPENDECTOMY    . AV FISTULA PLACEMENT Left 03/03/2019   Procedure: ARTERIOVENOUS (AV) FISTULA CREATION LEFT ARM;  Surgeon: Waynetta Sandy, MD;  Location: [facility];  Service: Vascular;  Laterality: Left;  . BASCILIC VEIN TRANSPOSITION Right 04/12/2019   Procedure: RIGHT UPPER EXTREMITY BASCILIC VEIN TRANSPOSITION FIRST STAGE FISTULA;  Surgeon: Angelia Mould, MD;  Location: [facility];  Service: Vascular;  Laterality: Right;  . LIGATION OF ARTERIOVENOUS  FISTULA Left 03/06/2019   Procedure: LIGATION OF ARTERIOVENOUS  FISTULA;  Surgeon: Marty Heck, MD;  Location: [facility];  Service: Vascular;  Laterality: Left;  . spinal tap    . SPINE SURGERY     related to a spinal infection, unsure of surgery or infection source  . WISDOM TOOTH EXTRACTION         Family History  Problem Relation Age of Onset  . Hypertension Mother   . Kidney failure Mother   . Diabetes Mother   . Heart disease Maternal Grandmother   . Stroke Maternal Grandfather     Social History   Tobacco Use  . Smoking status: Former Smoker    Packs/day: 0.30    Types: Cigarettes    Quit date: 02/27/2016    Years since quitting: 4.1  . Smokeless tobacco: Never Used  Vaping Use  . Vaping Use: Never used  Substance Use Topics  . Alcohol use: Not Currently    Comment: rare  . Drug use: Yes    Types: Marijuana  Home Medications Prior to Admission medications   Medication Sig Start Date End Date Taking? Authorizing Provider  ABILIFY MAINTENA 400 MG PRSY prefilled syringe Inject 400 mg into the muscle every 30 (thirty) days. 02/03/20  Yes [provider]  amLODipine (NORVASC) 10 MG tablet Take 10 mg by mouth daily. 08/24/19  Yes [provider]  AURYXIA 1 GM 210 MG(Fe) tablet Take 210 mg by mouth 3 (three) times daily with meals. 07/26/19  Yes [provider]  calcitRIOL (ROCALTROL) 0.25 MCG capsule Take 0.25 mcg by mouth daily.   Yes [provider]  triamcinolone ointment (KENALOG) 0.1 % Apply 1 application topically 2 (two) times daily. 12/16/19  Yes Matilde Haymaker, MD  HYDROcodone-acetaminophen (NORCO) 5-325 MG tablet Take 1 tablet by mouth every 6 (six) hours as needed for moderate pain. Patient not taking: No sig reported 04/12/19   Rhyne, Aldona Bar J, PA-C  risperiDONE (RISPERDAL) 1 MG tablet Take 1 tablet (1 mg total) by mouth at bedtime. Patient not taking: No sig reported 12/21/18 12/21/19  Arfeen, Arlyce Harman, MD    Allergies    Nsaids and Other  Review of Systems   Review of Systems  Constitutional: Negative for chills and fever.  HENT: Negative for congestion.   Respiratory: Negative for shortness of breath.   Cardiovascular: Negative for chest pain.  Gastrointestinal: Negative for abdominal pain, diarrhea, nausea and vomiting.  Genitourinary: Negative for enuresis.  Musculoskeletal: Negative for back pain.  Skin: Negative for rash.  Neurological: Negative for headaches.  Hematological: Does not bruise/bleed easily.    Physical Exam Updated Vital Signs BP (!) 143/83 (BP Location: Left Arm)   Pulse 85   Temp 98.7 F (37.1 C) (Oral)   Resp 16   Ht 5\' 8"  (1.727 m)   Wt 72.6 kg   SpO2 100%   BMI 24.33 kg/m   Physical Exam Vitals and nursing note reviewed.  Constitutional:      General: He is not in acute distress.    Appearance: He is not ill-appearing.  HENT:     Head: Normocephalic and atraumatic.     Nose: No congestion.  Eyes:     General: No scleral icterus.    Conjunctiva/sclera: Conjunctivae normal.  Cardiovascular:     Rate and Rhythm: Normal rate and regular rhythm.     Pulses: Normal pulses.     Heart sounds: No murmur heard. No friction rub. No gallop.   Pulmonary:     Effort: No respiratory distress.     Breath sounds: No wheezing, rhonchi or rales.  Abdominal:     Palpations: Abdomen is soft.     Tenderness: There is no guarding.  Musculoskeletal:     Right lower leg: No edema.     Left  lower leg: No edema.     Comments: Patient is moving all 4 extremities out difficulty.  Skin:    General: Skin is warm and dry.     Comments: Patient has a noted right fistula at his [facility], has good palpable thrill, no signs infection noted.  Neurological:     Mental Status: [name].  Psychiatric:        Mood and Affect: Mood normal.     ED Results / Procedures / Treatments   Labs (all labs ordered are listed, but only abnormal results are displayed) Labs Reviewed  BASIC METABOLIC PANEL - Abnormal; Notable for the following components:      Result Value   Potassium 5.6 (*)  Chloride 112 (*)    CO2 16 (*)    BUN 84 (*)    Creatinine, Ser 11.28 (*)    Calcium 8.7 (*)    GFR, Estimated 6 (*)    All other components within normal limits  CBC - Abnormal; Notable for the following components:   WBC 3.7 (*)    RBC 3.96 (*)    Hemoglobin 12.2 (*)    HCT 38.1 (*)    All other components within normal limits  RESP PANEL BY RT-PCR (FLU A&B, COVID) [id number]    EKG EKG Interpretation  Date/Time:  Thursday May 03 2020 09:27:54 EST Ventricular Rate:  83 PR Interval:  148 QRS Duration: 92 QT Interval:  348 QTC Calculation: 408 R Axis:   58 Text Interpretation: Normal sinus rhythm Normal ECG No significant change since last tracing Confirmed by Isla Pence [phone]) on 05/03/2020 10:26:00 AM   Radiology DG Chest Port 1 View  Result Date: 05/03/2020 CLINICAL DATA:  Hypertension.  Recent missed dialysis EXAM: PORTABLE CHEST 1 VIEW COMPARISON:  October 26, 2019. FINDINGS: The lungs are clear. Heart size and pulmonary vascularity are normal. No adenopathy. No bone lesions. IMPRESSION: Lungs clear.  Cardiac silhouette within normal limits. Electronically Signed   By: Lowella Grip III M.D.   On: 05/03/2020 11:33    Procedures Procedures (including critical care time)  Medications Ordered in ED Medications  sodium zirconium cyclosilicate (LOKELMA) packet 10 g (10 g Oral  Given 05/03/20 1205)    ED Course  I have reviewed the triage vital signs and the nursing notes.  Pertinent labs & imaging results that were available during my care of the patient were reviewed by me and considered in my medical decision making (see chart for details).    MDM Rules/Calculators/A&P                          Patient presents due to missed hemodialysis.  [name], does not appear acute distress, vital signs reassuring.  Will obtain appropriate work-up and reevaluate.  Due to the number of missed dialysis treatments will consult with nephrology for further recommendations.  Spoke with Dr. Hollie Salk who agrees patient could be safely discharged home and can follow-up outpatient hemodialysis tomorrow.  Recommends 1 dose of Lokelma here.  CBC shows leukopenia at 3.7.  Be a baseline for patient, normocytic anemia 12.2 appears to be at baseline for patient.  BMP shows slight hyperkalemia 5.6, metabolic acidosis with a CO2 of 16, BUN of 84 elevated from baseline, creatinine 11.8 at baseline for patient, no anion gap present.  Chest x-ray does not reveal any acute findings.  EKG sinus rhythm without signs of ischemia.  Low suspicion for emergent dialysis as there is no severe electrolyte derailments, no EKG changes, no signs of respiratory distress, or fluid overload on exam, abdomen soft nontender to palpation, tolerating p.o. without difficulty, vital signs are reassuring.  Low suspicion for systemic infection as patient is nontoxic-appearing, vital signs reassuring, no obvious source infection on my exam.  Low suspicion for fistula issues as it had palpable thrill, no signs of overlying infection.  Patient is here due to missed dialysis and should go to his outpatient dialysis treatment tomorrow morning.  Vital signs have remained stable, no indication for hospital admission.  Patient discussed with attending and they agreed with assessment and plan.  Patient given at home care as well  strict return precautions.  Patient verbalized that they  understood agreed to said plan.    Final Clinical Impression(s) / ED Diagnoses Final diagnoses:  Dialysis patient [facility])    Rx / DC Orders ED Discharge Orders    None       Aron Baba 05/03/20 1209    Isla Pence, MD 05/03/20 1445

## 2020-05-03 NOTE — ED Triage Notes (Signed)
Pt requesting dialysis, states he has not been since 12/10 due to the system down at his center and then he didn't go last Friday because he had to work. Pt a.o, nad. Has no complaints. Pt HOH.

## 2020-05-07 ENCOUNTER — Other Ambulatory Visit: Payer: Self-pay

## 2020-05-07 ENCOUNTER — Emergency Department (HOSPITAL_COMMUNITY)
Admission: EM | Admit: 2020-05-07 | Discharge: 2020-05-08 | Disposition: A | Discharge status: Home / Self Care | Payer: Medicare Other | Attending: Emergency Medicine | Admitting: Emergency Medicine

## 2020-05-07 DIAGNOSIS — U071 COVID-19: Secondary | ICD-10-CM | POA: Insufficient documentation

## 2020-05-07 DIAGNOSIS — Z5321 Procedure and treatment not carried out due to patient leaving prior to being seen by health care provider: Secondary | ICD-10-CM | POA: Insufficient documentation

## 2020-05-07 DIAGNOSIS — R5383 Other fatigue: Secondary | ICD-10-CM | POA: Diagnosis present

## 2020-05-07 LAB — RESP PANEL BY RT-PCR (FLU A&B, COVID) ARPGX2
Influenza A by PCR: NEGATIVE
Influenza B by PCR: NEGATIVE
SARS Coronavirus 2 by RT PCR: POSITIVE — AB

## 2020-05-07 NOTE — ED Notes (Signed)
Pt called for room x3 with no response. Per others in the lobby, pt is outside. This EMT went outside and called his name, no one out front was identified.

## 2020-05-07 NOTE — ED Triage Notes (Signed)
Pt presents to ED [facility]. Pt c/o fatigue. Pt missed dialysis today d/t body aches and fatigue. EMS VSS

## 2020-05-08 ENCOUNTER — Telehealth: Payer: Self-pay | Admitting: Nurse Practitioner

## 2020-05-08 NOTE — Telephone Encounter (Signed)
Called to Discuss with patient about Covid symptoms and the use of the monoclonal antibody infusion for those with mild to moderate Covid symptoms and at a high risk of hospitalization.     Pt appears to qualify for this infusion due to co-morbid conditions and/or a member of an at-risk group in accordance with the FDA Emergency Use Authorization.   Patient declines infusion at this time.   Alda Lea, NP [facility]  [phone]

## 2020-08-28 ENCOUNTER — Other Ambulatory Visit: Payer: Self-pay

## 2020-08-28 ENCOUNTER — Emergency Department (HOSPITAL_COMMUNITY)
Admission: EM | Admit: 2020-08-28 | Discharge: 2020-08-28 | Discharge status: Against Medical Advice | Payer: Medicare Other | Attending: Emergency Medicine | Admitting: Emergency Medicine

## 2020-08-28 ENCOUNTER — Encounter (HOSPITAL_COMMUNITY): Payer: Self-pay | Admitting: Emergency Medicine

## 2020-08-28 DIAGNOSIS — Z5321 Procedure and treatment not carried out due to patient leaving prior to being seen by health care provider: Secondary | ICD-10-CM | POA: Diagnosis not present

## 2020-08-28 DIAGNOSIS — T82598A Other mechanical complication of other cardiac and vascular devices and implants, initial encounter: Secondary | ICD-10-CM | POA: Diagnosis not present

## 2020-08-28 NOTE — ED Triage Notes (Signed)
Pt states he has missed his last 3 dialysis appointments, and is required to be seen and assessed at the hospital before he can go back. Denies complaints at this time.

## 2020-08-28 NOTE — ED Notes (Signed)
Pt called for vitals with no response and not visible in lobby

## 2020-08-28 NOTE — ED Triage Notes (Signed)
Emergency Medicine Provider Triage Evaluation Note  [name] , a 35 y.o. male  was evaluated in triage.  Pt complains of wanting to be checked out. Missed three days of dialysis, states that in order for him to go back tomorrow needs to be evaluated. No symptoms.  Review of Systems  Positive: None  Negative: SOB, CP  Physical Exam  There were no vitals taken for this visit. Gen:   Awake, no distress   HEENT:  Atraumatic  Resp:  Normal effort  Cardiac:  Normal rate  Abd:   Nondistended, nontender  MSK:   Moves extremities without difficulty  Neuro:  Speech clear   Medical Decision Making  Medically screening exam initiated at 6:22 PM.  Appropriate orders placed.  [name] was informed that the remainder of the evaluation will be completed by another provider, this initial triage assessment does not replace that evaluation, and the importance of remaining in the ED until their evaluation is complete.  Clinical Impression  Stable   MSE was initiated and I personally evaluated the patient and placed orders (if any) at  6:22 PM on August 28, 2020.  The patient appears stable so that the remainder of the MSE may be completed by another provider.    Alfredia Client, PA-C 08/28/20 1838

## 2020-08-29 ENCOUNTER — Emergency Department (HOSPITAL_COMMUNITY): Payer: Medicare Other

## 2020-08-29 ENCOUNTER — Encounter (HOSPITAL_COMMUNITY): Payer: Self-pay | Admitting: Emergency Medicine

## 2020-08-29 ENCOUNTER — Emergency Department (HOSPITAL_COMMUNITY)
Admission: EM | Admit: 2020-08-29 | Discharge: 2020-08-29 | Disposition: A | Discharge status: Home / Self Care | Payer: Medicare Other | Attending: Emergency Medicine | Admitting: Emergency Medicine

## 2020-08-29 DIAGNOSIS — E875 Hyperkalemia: Secondary | ICD-10-CM | POA: Diagnosis not present

## 2020-08-29 DIAGNOSIS — Z87891 Personal history of nicotine dependence: Secondary | ICD-10-CM | POA: Diagnosis not present

## 2020-08-29 DIAGNOSIS — Z91158 Patient's noncompliance with renal dialysis for other reason: Secondary | ICD-10-CM

## 2020-08-29 DIAGNOSIS — N186 End stage renal disease: Secondary | ICD-10-CM | POA: Diagnosis not present

## 2020-08-29 DIAGNOSIS — Z79899 Other long term (current) drug therapy: Secondary | ICD-10-CM | POA: Insufficient documentation

## 2020-08-29 DIAGNOSIS — Z9115 Patient's noncompliance with renal dialysis: Secondary | ICD-10-CM | POA: Diagnosis present

## 2020-08-29 DIAGNOSIS — R197 Diarrhea, unspecified: Secondary | ICD-10-CM | POA: Insufficient documentation

## 2020-08-29 DIAGNOSIS — I120 Hypertensive chronic kidney disease with stage 5 chronic kidney disease or end stage renal disease: Secondary | ICD-10-CM | POA: Diagnosis not present

## 2020-08-29 LAB — CBC WITH DIFFERENTIAL/PLATELET
Abs Immature Granulocytes: 0.01 10*3/uL (ref 0.00–0.07)
Basophils Absolute: 0 10*3/uL (ref 0.0–0.1)
Basophils Relative: 1 %
Eosinophils Absolute: 0.1 10*3/uL (ref 0.0–0.5)
Eosinophils Relative: 4 %
HCT: 37.9 % — ABNORMAL LOW (ref 39.0–52.0)
Hemoglobin: 12 g/dL — ABNORMAL LOW (ref 13.0–17.0)
Immature Granulocytes: 0 %
Lymphocytes Relative: 28 %
Lymphs Abs: 0.9 10*3/uL (ref 0.7–4.0)
MCH: 31 pg (ref 26.0–34.0)
MCHC: 31.7 g/dL (ref 30.0–36.0)
MCV: 97.9 fL (ref 80.0–100.0)
Monocytes Absolute: 0.2 10*3/uL (ref 0.1–1.0)
Monocytes Relative: 6 %
Neutro Abs: 2 10*3/uL (ref 1.7–7.7)
Neutrophils Relative %: 61 %
Platelets: 162 10*3/uL (ref 150–400)
RBC: 3.87 MIL/uL — ABNORMAL LOW (ref 4.22–5.81)
RDW: 13.3 % (ref 11.5–15.5)
WBC: 3.3 10*3/uL — ABNORMAL LOW (ref 4.0–10.5)
nRBC: 0 % (ref 0.0–0.2)

## 2020-08-29 LAB — COMPREHENSIVE METABOLIC PANEL
ALT: 37 U/L (ref 0–44)
AST: 31 U/L (ref 15–41)
Albumin: 4.6 g/dL (ref 3.5–5.0)
Alkaline Phosphatase: 71 U/L (ref 38–126)
Anion gap: 10 (ref 5–15)
BUN: 83 mg/dL — ABNORMAL HIGH (ref 6–20)
CO2: 15 mmol/L — ABNORMAL LOW (ref 22–32)
Calcium: 9.1 mg/dL (ref 8.9–10.3)
Chloride: 112 mmol/L — ABNORMAL HIGH (ref 98–111)
Creatinine, Ser: 12.76 mg/dL — ABNORMAL HIGH (ref 0.61–1.24)
GFR, Estimated: 5 mL/min — ABNORMAL LOW (ref >60)
Glucose, Bld: 91 mg/dL (ref 70–99)
Potassium: 6.1 mmol/L — ABNORMAL HIGH (ref 3.5–5.1)
Sodium: 137 mmol/L (ref 135–145)
Total Bilirubin: 0.6 mg/dL (ref 0.3–1.2)
Total Protein: 7.7 g/dL (ref 6.5–8.1)

## 2020-08-29 NOTE — ED Triage Notes (Signed)
Pt here with c/o missed 3 days of dialysis , pt came in last night but left ,pt tried to go today but dialysis wanted him to come get checked out

## 2020-08-29 NOTE — Discharge Instructions (Signed)
Please go straight to your dialysis center this afternoon.  They are expecting you as soon as possible.  If you are not able to go to dialysis or develop any new or worsening symptoms, please come straight back to the emergency department.  It was a pleasure to meet you.

## 2020-08-29 NOTE — ED Provider Notes (Signed)
[facility] EMERGENCY DEPARTMENT Provider Note   CSN: [id number] Arrival date & time: 08/29/20  [id number]     History No chief complaint on file.   [name] is a 35 y.o. male.  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 Patient presents to the ED today due to evaluation for missed dialysis appointments.  Patient states that he has chronic diarrhea and ran out of the medication that he takes for it and because of this did not go to dialysis last week.  He states his last dialysis session was on April 8.  He went to dialysis today but since he had such a significant gap in his dialysis sessions he was told to come to the emergency department to be evaluated before he starts dialysis once again.  Patient states he has no complaints at this time.  No leg swelling, shortness of breath, chest pain.       Past Medical History:  Diagnosis Date  . Bipolar 1 disorder ([place])   . Depression   . GERD (gastroesophageal reflux disease)   . Hearing difficulty of left ear    75% hearing  . 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  . Hypertension   . Renal disorder     Patient Active Problem List   Diagnosis Date Noted  . Rash and nonspecific skin eruption 12/16/2019  . Rash on scrotum 12/16/2019  . Dehydration 06/07/2018  . Hypertension 06/07/2018  . ESRD (end stage renal disease) ([place]) 06/07/2018  . Alport syndrome 07/14/2017  . GERD (gastroesophageal reflux disease) 07/14/2017  . History of appendectomy 07/14/2017  . Obesity (BMI 30.0-34.9) 07/14/2017  . Pre-transplant evaluation for kidney transplant 07/14/2017  . Secondary hyperparathyroidism ([place]) 07/14/2017  . Tobacco use 07/14/2017  . Bipolar 1 disorder ([place]) 09/01/2013  . 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 APPENDECTOMY     . AV FISTULA PLACEMENT Left 03/03/2019   Procedure: ARTERIOVENOUS (AV) FISTULA CREATION LEFT ARM;  Surgeon: Waynetta Sandy, MD;  Location: [place];  Service: Vascular;  Laterality: Left;  . BASCILIC VEIN TRANSPOSITION Right 04/12/2019   Procedure: RIGHT UPPER EXTREMITY BASCILIC VEIN TRANSPOSITION FIRST STAGE FISTULA;  Surgeon: Angelia Mould, MD;  Location: [place];  Service: Vascular;  Laterality: Right;  . LIGATION OF ARTERIOVENOUS  FISTULA Left 03/06/2019   Procedure: LIGATION OF ARTERIOVENOUS  FISTULA;  Surgeon: Marty Heck, MD;  Location: [place];  Service: Vascular;  Laterality: Left;  . spinal tap    . SPINE SURGERY     related to a spinal infection, unsure of surgery or infection source  . WISDOM TOOTH EXTRACTION         Family History  Problem Relation Age of Onset  . Hypertension Mother   . Kidney failure Mother   . Diabetes Mother   . Heart disease Maternal Grandmother   . Stroke Maternal Grandfather     Social History   Tobacco Use  . Smoking status: Former Smoker    Packs/day: 0.30    Types: Cigarettes    Quit date: 02/27/2016    Years since quitting: 4.5  . Smokeless tobacco: Never Used  Vaping Use  . Vaping Use: Never used  Substance Use Topics  . Alcohol use: Not Currently    Comment: rare  . Drug use:  Yes    Types: Marijuana    Home Medications Prior to Admission medications   Medication Sig Start Date End Date Taking? Authorizing Provider  amLODipine (NORVASC) 10 MG tablet Take 10 mg by mouth daily. 08/24/19  Yes [provider]  AURYXIA 1 GM 210 MG(Fe) tablet Take 210 mg by mouth 3 (three) times daily with meals. 07/26/19  Yes [provider]  calcitRIOL (ROCALTROL) 0.25 MCG capsule Take 0.25 mcg by mouth daily.   Yes [provider]  triamcinolone ointment (KENALOG) 0.1 % Apply 1 application topically 2 (two) times daily. 12/16/19  Yes Matilde Haymaker, MD    Allergies    Nsaids and Other  Review of  Systems   Review of Systems  All other systems reviewed and are negative. Ten systems reviewed and are negative for acute change, except as noted in the HPI.   Physical Exam Updated Vital Signs BP 135/88   Pulse 70   Temp 97.8 F (36.6 C) (Oral)   Resp 13   SpO2 100%   Physical Exam Vitals and nursing note reviewed.  Constitutional:      General: He is not in acute distress.    Appearance: Normal appearance. He is not ill-appearing, toxic-appearing or diaphoretic.  HENT:     Head: Normocephalic and atraumatic.     Right Ear: External ear normal.     Left Ear: External ear normal.     Nose: Nose normal.     Mouth/Throat:     Mouth: Mucous membranes are moist.     Pharynx: Oropharynx is clear. No oropharyngeal exudate or posterior oropharyngeal erythema.  Eyes:     Extraocular Movements: Extraocular movements intact.  Cardiovascular:     Rate and Rhythm: Normal rate and regular rhythm.     Pulses: Normal pulses.     Heart sounds: Normal heart sounds. No murmur heard. No friction rub. No gallop.      Comments: Regular rate and rhythm without murmurs, rubs, or gallops.  AV fistula noted in the right upper extremity with good thrill.  No overlying signs of infection. Pulmonary:     Effort: Pulmonary effort is normal. No respiratory distress.     Breath sounds: Normal breath sounds. No stridor. No wheezing, rhonchi or rales.     Comments: Lungs are clear to auscultation bilaterally.  No wheezing, rales, or rhonchi. Abdominal:     General: Abdomen is flat.     Tenderness: There is no abdominal tenderness.  Musculoskeletal:        General: Normal range of motion.     Cervical back: Normal range of motion and neck supple. No tenderness.     Right lower leg: No edema.     Left lower leg: No edema.     Comments: No leg swelling.  Skin:    General: Skin is warm and dry.  Neurological:     General: No focal deficit present.     Mental Status: [name] and oriented to person,  place, and time.  Psychiatric:        Mood and Affect: Mood normal.        Behavior: Behavior normal.    ED Results / Procedures / Treatments   Labs (all labs ordered are listed, but only abnormal results are displayed) Labs Reviewed  COMPREHENSIVE METABOLIC PANEL - Abnormal; Notable for the following components:      Result Value   Potassium 6.1 (*)    Chloride 112 (*)  CO2 15 (*)    BUN 83 (*)    Creatinine, Ser 12.76 (*)    GFR, Estimated 5 (*)    All other components within normal limits  CBC WITH DIFFERENTIAL/PLATELET - Abnormal; Notable for the following components:   WBC 3.3 (*)    RBC 3.87 (*)    Hemoglobin 12.0 (*)    HCT 37.9 (*)    All other components within normal limits   EKG EKG Interpretation  Date/Time:  Wednesday August 29 2020 10:09:50 EDT Ventricular Rate:  77 PR Interval:  159 QRS Duration: 74 QT Interval:  365 QTC Calculation: 413 R Axis:   57 Text Interpretation: Sinus rhythm Normal ECG No significant change since last tracing Confirmed by Pattricia Boss [phone]) on 08/29/2020 10:14:53 AM  Radiology DG Chest Portable 1 View  Result Date: 08/29/2020 CLINICAL DATA:  Multiple missed dialysis appointments, assess volume status. EXAM: PORTABLE CHEST 1 VIEW COMPARISON:  05/03/2020 FINDINGS: Artifact from EKG leads. Normal heart size and mediastinal contours. No acute infiltrate or edema. No effusion or pneumothorax. No acute osseous findings. IMPRESSION: Normal chest. Electronically Signed   By: Monte Fantasia M.D.   On: 08/29/2020 10:48   Procedures Procedures   Medications Ordered in ED Medications - No data to display  ED Course  I have reviewed the triage vital signs and the nursing notes.  Pertinent labs & imaging results that were available during my care of the patient were reviewed by me and considered in my medical decision making (see chart for details).  Clinical Course as of 08/29/20 1241  Wed Aug 29, 2020  1131 Hemoglobin(!):  12.0 Hemoglobin stable at 12. [LJ]  1131 DG Chest Portable 1 View Negative chest x-ray. [LJ]    Clinical Course User Index [LJ] Rayna Sexton, PA-C   MDM Rules/Calculators/A&P                          Pt is a 35 y.o. male who presents to the emergency department for medical evaluation due to multiple missed dialysis appointments.  Labs: CBC with a white blood cell count of 3.3 and a hemoglobin of 12.  Hemoglobin appears to be stable. CMP with potassium of 6.1, chloride 112, CO2 of 15, BUN of 83, creatinine of 12.76, GFR of 5.  Imaging: Chest x-ray is negative.  ECG: Normal sinus rhythm.  No significant change since last tracing.  I, Rayna Sexton, PA-C, personally reviewed and evaluated these images and lab results as part of my medical decision-making.  Patient missed dialysis last week due to diarrhea.  States that he went to dialysis today and was told to come get evaluated prior to initiating dialysis once again due to the amount of time that he missed.  Physical exam is reassuring.  Lungs are clear to auscultation bilaterally.  Chest x-ray is negative.  No pedal edema.  Does not appear to be fluid overloaded.  Hemoglobin is stable.  Potassium is elevated at 6.1.  Patient having no chest pain or shortness of breath.  He has no complaints at this time.  ECG shows normal sinus rhythm and no significant changes since last tracing.  Patient discussed with the social worker on call with the renal navigator team.  They have reached out to his dialysis center and confirm that he can be dialyzed this afternoon.  This was discussed with the patient and he was amenable with this plan.  We will discharge the patient at this  time and he is going to proceed directly to dialysis.  He understands that if he develops any worsening symptoms or cannot be dialyzed, that he needs to return to the emergency department immediately.  His questions were answered and he was amicable at the time of  discharge.  Note: Portions of this report may have been transcribed using voice recognition software. Every effort was made to ensure accuracy; however, inadvertent computerized transcription errors may be present.   Final Clinical Impression(s) / ED Diagnoses Final diagnoses:  Dialysis patient, noncompliant ([place])  Hyperkalemia   Rx / DC Orders ED Discharge Orders    None       Rayna Sexton, PA-C 08/29/20 1243    Pattricia Boss, MD 08/30/20 [phone]

## 2020-10-24 ENCOUNTER — Ambulatory Visit: Payer: Medicare Other | Admitting: Family Medicine

## 2021-01-16 ENCOUNTER — Other Ambulatory Visit: Payer: Self-pay

## 2021-01-16 ENCOUNTER — Encounter (HOSPITAL_BASED_OUTPATIENT_CLINIC_OR_DEPARTMENT_OTHER): Payer: Self-pay | Admitting: Emergency Medicine

## 2021-01-16 ENCOUNTER — Emergency Department (HOSPITAL_BASED_OUTPATIENT_CLINIC_OR_DEPARTMENT_OTHER)
Admission: EM | Admit: 2021-01-16 | Discharge: 2021-01-16 | Disposition: A | Discharge status: Home / Self Care | Payer: Medicare Other | Attending: Emergency Medicine | Admitting: Emergency Medicine

## 2021-01-16 DIAGNOSIS — Z79899 Other long term (current) drug therapy: Secondary | ICD-10-CM | POA: Diagnosis not present

## 2021-01-16 DIAGNOSIS — I120 Hypertensive chronic kidney disease with stage 5 chronic kidney disease or end stage renal disease: Secondary | ICD-10-CM | POA: Diagnosis not present

## 2021-01-16 DIAGNOSIS — N186 End stage renal disease: Secondary | ICD-10-CM | POA: Diagnosis not present

## 2021-01-16 DIAGNOSIS — Z9115 Patient's noncompliance with renal dialysis: Secondary | ICD-10-CM | POA: Insufficient documentation

## 2021-01-16 DIAGNOSIS — Z87891 Personal history of nicotine dependence: Secondary | ICD-10-CM | POA: Diagnosis not present

## 2021-01-16 LAB — BASIC METABOLIC PANEL
Anion gap: 12 (ref 5–15)
BUN: 97 mg/dL — ABNORMAL HIGH (ref 6–20)
CO2: 15 mmol/L — ABNORMAL LOW (ref 22–32)
Calcium: 8.5 mg/dL — ABNORMAL LOW (ref 8.9–10.3)
Chloride: 112 mmol/L — ABNORMAL HIGH (ref 98–111)
Creatinine, Ser: 12.14 mg/dL — ABNORMAL HIGH (ref 0.61–1.24)
GFR, Estimated: 5 mL/min — ABNORMAL LOW (ref >60)
Glucose, Bld: 75 mg/dL (ref 70–99)
Potassium: 4.9 mmol/L (ref 3.5–5.1)
Sodium: 139 mmol/L (ref 135–145)

## 2021-01-16 LAB — CBC
HCT: 32.9 % — ABNORMAL LOW (ref 39.0–52.0)
Hemoglobin: 10.9 g/dL — ABNORMAL LOW (ref 13.0–17.0)
MCH: 31.1 pg (ref 26.0–34.0)
MCHC: 33.1 g/dL (ref 30.0–36.0)
MCV: 93.7 fL (ref 80.0–100.0)
Platelets: 168 10*3/uL (ref 150–400)
RBC: 3.51 MIL/uL — ABNORMAL LOW (ref 4.22–5.81)
RDW: 13.1 % (ref 11.5–15.5)
WBC: 4.6 10*3/uL (ref 4.0–10.5)
nRBC: 0 % (ref 0.0–0.2)

## 2021-01-16 NOTE — ED Provider Notes (Signed)
[facility] EMERGENCY DEPT Provider Note   CSN: [id number] Arrival date & time: 01/16/21  1440     History Chief Complaint  Patient presents with   Abnormal Lab    [name] is a 35 y.o. male.  HPI 35 year old male history of end-stage renal disease, hypertension, on dialysis Monday Wednesday Fridays missed dialysis of the past 2 weeks.  He reports that he went to go to dialysis today and called them and they told him they could not come in until he had his labs checked in the emergency department.  He presents here stating that he needs lab so he can go to dialysis.  He denies any shortness of breath.  He states that he initially missed dialysis [facility] because they were short staffed, he states that he missed dialysis last week due to childcare issues.  He reports that he decreases his oral intake when he knows he cannot get to dialysis any does not feel that he is having any problems at this time.  He has a access graft in his right upper extremity and states this has been used without difficulty.  He denies any headache, head injury, chest pain, dyspnea, cough, fever, chills, nausea, vomiting, or diarrhea.     Past Medical History:  Diagnosis Date   Bipolar 1 disorder ([place])    Depression    GERD (gastroesophageal reflux disease)    Hearing difficulty of left ear    75% hearing   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   Hypertension    Renal disorder     Patient Active Problem List   Diagnosis Date Noted   Rash and nonspecific skin eruption 12/16/2019   Rash on scrotum 12/16/2019   Dehydration 06/07/2018   Hypertension 06/07/2018   ESRD (end stage renal disease) ([place]) 06/07/2018   Alport syndrome 07/14/2017   GERD (gastroesophageal reflux disease) 07/14/2017   History of appendectomy 07/14/2017   Obesity (BMI 30.0-34.9) 07/14/2017   Pre-transplant evaluation for kidney transplant 07/14/2017   Secondary hyperparathyroidism ([place]) 07/14/2017    Tobacco use 07/14/2017   Bipolar 1 disorder ([place]) 09/01/2013   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    Past Surgical History:  Procedure Laterality Date   APPENDECTOMY     AV FISTULA PLACEMENT Left 03/03/2019   Procedure: ARTERIOVENOUS (AV) FISTULA CREATION LEFT ARM;  Surgeon: Waynetta Sandy, MD;  Location: [place];  Service: Vascular;  Laterality: Left;   [place] Right 04/12/2019   Procedure: RIGHT UPPER EXTREMITY [place] FIRST STAGE FISTULA;  Surgeon: Angelia Mould, MD;  Location: [place];  Service: Vascular;  Laterality: Right;   LIGATION OF ARTERIOVENOUS  FISTULA Left 03/06/2019   Procedure: LIGATION OF ARTERIOVENOUS  FISTULA;  Surgeon: Marty Heck, MD;  Location: [place];  Service: Vascular;  Laterality: Left;   spinal tap     SPINE SURGERY     related to a spinal infection, unsure of surgery or infection source   WISDOM TOOTH EXTRACTION         Family History  Problem Relation Age of Onset   Hypertension Mother    Kidney failure Mother    Diabetes Mother    Heart disease Maternal Grandmother    Stroke Maternal Grandfather     Social History   Tobacco Use   Smoking status: Former    Packs/day: 0.30    Types: Cigarettes    Quit date: 02/27/2016    Years since quitting: 4.8  Smokeless tobacco: Never  Vaping Use   Vaping Use: Never used  Substance Use Topics   Alcohol use: Not Currently    Comment: rare   Drug use: Yes    Types: Marijuana    Home Medications Prior to Admission medications   Medication Sig Start Date End Date Taking? Authorizing Provider  amLODipine (NORVASC) 10 MG tablet Take 10 mg by mouth daily. 08/24/19   [provider]  AURYXIA 1 GM 210 MG(Fe) tablet Take 210 mg by mouth 3 (three) times daily with meals. 07/26/19   [provider]  calcitRIOL (ROCALTROL) 0.25 MCG capsule Take 0.25 mcg by mouth daily.    [provider]  triamcinolone ointment  (KENALOG) 0.1 % Apply 1 application topically 2 (two) times daily. 12/16/19   Matilde Haymaker, MD    Allergies    Nsaids and Other  Review of Systems   Review of Systems  All other systems reviewed and are negative.  Physical Exam Updated Vital Signs BP (!) 144/105 (BP Location: Left Arm)   Pulse 85   Temp 98.6 F (37 C) (Oral)   Resp 16   Ht 1.727 m ('5\' 8"'$ )   Wt 79.2 kg   SpO2 99%   BMI 26.55 kg/m   Physical Exam Vitals and nursing note reviewed.  Constitutional:      Appearance: Normal appearance.  HENT:     Head: Normocephalic.     Right Ear: External ear normal.     Left Ear: External ear normal.     Nose: Nose normal.     Mouth/Throat:     Pharynx: Oropharynx is clear.  Eyes:     Pupils: Pupils are equal, round, and reactive to light.  Cardiovascular:     Rate and Rhythm: Normal rate and regular rhythm.  Pulmonary:     Effort: Pulmonary effort is normal.  Abdominal:     General: Abdomen is flat.     Palpations: Abdomen is soft.  Musculoskeletal:        General: Normal range of motion.     Cervical back: Normal range of motion.     Comments: Right upper arm access with thrill No redness, tenderness, or warmth  Skin:    General: Skin is warm and dry.     Capillary Refill: Capillary refill takes less than 2 seconds.  Neurological:     General: No focal deficit present.     Mental Status: [name].  Psychiatric:        Mood and Affect: Mood normal.    ED Results / Procedures / Treatments   Labs (all labs ordered are listed, but only abnormal results are displayed) Labs Reviewed  CBC - Abnormal; Notable for the following components:      Result Value   RBC 3.51 (*)    Hemoglobin 10.9 (*)    HCT 32.9 (*)    All other components within normal limits  BASIC METABOLIC PANEL - Abnormal; Notable for the following components:   Chloride 112 (*)    CO2 15 (*)    BUN 97 (*)    Creatinine, Ser 12.14 (*)    Calcium 8.5 (*)    GFR, Estimated 5 (*)    All  other components within normal limits    EKG None  Radiology No results found.  Procedures Procedures   Medications Ordered in ED Medications - No data to display  ED Course  I have reviewed the triage vital signs and the nursing  notes.  Pertinent labs & imaging results that were available during my care of the patient were reviewed by me and considered in my medical decision making (see chart for details).    MDM Rules/Calculators/A&P                          Patient with end-stage renal disease is missed dialysis for 2 weeks.  He was told to come the ED for evaluation.  Here his potassium is normal.  Other labs are consistent with renal failure.  He does not appear to be volume overloaded.  He is hemodynamically stable and appears stable for discharge.  I discussed his care with Dr. Osborne Casco.  He is seen at the [facility] dialysis center.  Dr. Osborne Casco will instruct them that he needs to be dialyzed tomorrow.  They should contact him, but he is instructed to contact them if he has not called first thing in the morning as he will need to get into be dialyzed then.  He is also given return precautions and voiced understanding.  Final Clinical Impression(s) / ED Diagnoses Final diagnoses:  ESRD (end stage renal disease) ([place])  Noncompliance with renal dialysis [facility])    Rx / DC Orders ED Discharge Orders     None        Pattricia Boss, MD 01/16/21 [phone]

## 2021-01-16 NOTE — ED Triage Notes (Signed)
Pt arrives to ED from dialysis center. Pt reports dialysis refused to treat him because he has missed the past two weeks. Dialysis sent him here to have labs drawn and make sure he is not hyperkalemic. Pt denies CP, SOB, swelling, nausea, and vomiting. Pt currently has no complaints.

## 2021-01-16 NOTE — Discharge Instructions (Addendum)
Please call the [place] dialysis center first thing in the morning. Dr. Osborne Casco is instructing them that you need to be dialyzed tomorrow. Please make sure that you receive dialysis tomorrow If you begin having any new symptoms especially shortness of breath please return to the [facility] emergency department.

## 2021-02-11 ENCOUNTER — Ambulatory Visit (INDEPENDENT_AMBULATORY_CARE_PROVIDER_SITE_OTHER): Payer: Medicare Other | Admitting: Family Medicine

## 2021-02-11 ENCOUNTER — Other Ambulatory Visit: Payer: Self-pay

## 2021-02-11 ENCOUNTER — Encounter: Payer: Self-pay | Admitting: Family Medicine

## 2021-02-11 DIAGNOSIS — I10 Essential (primary) hypertension: Secondary | ICD-10-CM | POA: Diagnosis not present

## 2021-02-11 DIAGNOSIS — Q8781 Alport syndrome: Secondary | ICD-10-CM | POA: Diagnosis not present

## 2021-02-11 DIAGNOSIS — E162 Hypoglycemia, unspecified: Secondary | ICD-10-CM

## 2021-02-11 DIAGNOSIS — Z833 Family history of diabetes mellitus: Secondary | ICD-10-CM

## 2021-02-11 DIAGNOSIS — R739 Hyperglycemia, unspecified: Secondary | ICD-10-CM | POA: Diagnosis not present

## 2021-02-11 DIAGNOSIS — F319 Bipolar disorder, unspecified: Secondary | ICD-10-CM

## 2021-02-11 LAB — POCT GLYCOSYLATED HEMOGLOBIN (HGB A1C): Hemoglobin A1C: 5.1 % (ref 4.0–5.6)

## 2021-02-11 MED ORDER — AMLODIPINE BESYLATE 10 MG PO TABS: 10.0000 mg | ORAL_TABLET | Freq: Every day | ORAL | 3 refills | Status: DC

## 2021-02-11 NOTE — Assessment & Plan Note (Signed)
Deaf and on dialysis.  Other than the ESRD prolonging the half life of endogenous insulin, I doubt contributing to his spells.

## 2021-02-11 NOTE — Assessment & Plan Note (Signed)
Poor control due to being out of meds.  I refilled his amlodipine.

## 2021-02-11 NOTE — Assessment & Plan Note (Signed)
The patient was cogent and appropriate throughout.  I doubt psychiatirc disturbance is playing a role in his spells.

## 2021-02-11 NOTE — Assessment & Plan Note (Addendum)
Clearly not diabetic.  His spells are more consistent with hypoglycemia.   Will treat with diet.

## 2021-02-11 NOTE — Patient Instructions (Signed)
I refilled your blood pressure medicine. You do not have diabetes.  You have low blood sugar. The treatment is diet - eat small frequent meals and snacks.  Don't eat big frequent meals, I don't want you getting fat. Also, try to figure out when your blood sugar is likely to drop - such as during dialysis.  If you figure a pattern, eat something beforehand to prevent the blood sugar from dropping.

## 2021-02-11 NOTE — Progress Notes (Signed)
    SUBJECTIVE:   CHIEF COMPLAINT / HPI:   Several issues:  Note: patient is deaf - much of our "conversation" was written.  He has Alports Syndrome with deafness and ESRD on dialysis. Having spells with dialysis in which he may gets shakey/jittery, sometimes sweaty and craves sweats.  He wants to be checked for diabetes.  He has a strong family history.  He also has a home BS machine.  He has checked his BS during such spells and it is usually below 70, occasionally below 50.  On review, wt fluctuates with a low of 160 and a high of 190.   Hypertension.  States usually well controled.  He is out of his amlodipine and waiting for renal to send refill.     OBJECTIVE:   BP (!) 153/90   Pulse 77   Ht '5\' 8"'$  (1.727 m)   Wt 174 lb 3.2 oz (79 kg)   SpO2 100%   BMI 26.49 kg/m   VS and weight noted POCT A1C=5.1 Lungs clear Cardiac RRR without m or g No peripheral edema  ASSESSMENT/PLAN:   Hypoglycemic syndrome Clearly not diabetic.  His spells are more consistent with hypoglycemia.   Will treat with diet.  Hypertension Poor control due to being out of meds.  I refilled his amlodipine.  Alport syndrome Deaf and on dialysis.  Other than the ESRD prolonging the half life of endogenous insulin, I doubt contributing to his spells.    Bipolar 1 disorder The patient was cogent and appropriate throughout.  I doubt psychiatirc disturbance is playing a role in his spells.       Zenia Resides, MD [place]

## 2021-06-07 ENCOUNTER — Emergency Department (HOSPITAL_COMMUNITY): Payer: BC Managed Care – PPO

## 2021-06-07 ENCOUNTER — Emergency Department (HOSPITAL_COMMUNITY)
Admission: EM | Admit: 2021-06-07 | Discharge: 2021-06-07 | Disposition: A | Discharge status: Home / Self Care | Payer: BC Managed Care – PPO | Attending: Emergency Medicine | Admitting: Emergency Medicine

## 2021-06-07 ENCOUNTER — Other Ambulatory Visit: Payer: Self-pay

## 2021-06-07 ENCOUNTER — Encounter (HOSPITAL_COMMUNITY): Payer: Self-pay | Admitting: Emergency Medicine

## 2021-06-07 DIAGNOSIS — I120 Hypertensive chronic kidney disease with stage 5 chronic kidney disease or end stage renal disease: Secondary | ICD-10-CM | POA: Insufficient documentation

## 2021-06-07 DIAGNOSIS — R109 Unspecified abdominal pain: Secondary | ICD-10-CM | POA: Insufficient documentation

## 2021-06-07 DIAGNOSIS — K625 Hemorrhage of anus and rectum: Secondary | ICD-10-CM | POA: Diagnosis not present

## 2021-06-07 DIAGNOSIS — N186 End stage renal disease: Secondary | ICD-10-CM | POA: Diagnosis not present

## 2021-06-07 DIAGNOSIS — Z992 Dependence on renal dialysis: Secondary | ICD-10-CM | POA: Diagnosis not present

## 2021-06-07 DIAGNOSIS — Z87891 Personal history of nicotine dependence: Secondary | ICD-10-CM | POA: Diagnosis not present

## 2021-06-07 LAB — COMPREHENSIVE METABOLIC PANEL
ALT: 45 U/L — ABNORMAL HIGH (ref 0–44)
AST: 32 U/L (ref 15–41)
Albumin: 3.9 g/dL (ref 3.5–5.0)
Alkaline Phosphatase: 91 U/L (ref 38–126)
Anion gap: 11 (ref 5–15)
BUN: 78 mg/dL — ABNORMAL HIGH (ref 6–20)
CO2: 18 mmol/L — ABNORMAL LOW (ref 22–32)
Calcium: 8.7 mg/dL — ABNORMAL LOW (ref 8.9–10.3)
Chloride: 112 mmol/L — ABNORMAL HIGH (ref 98–111)
Creatinine, Ser: 12.61 mg/dL — ABNORMAL HIGH (ref 0.61–1.24)
GFR, Estimated: 5 mL/min — ABNORMAL LOW (ref >60)
Glucose, Bld: 108 mg/dL — ABNORMAL HIGH (ref 70–99)
Potassium: 5 mmol/L (ref 3.5–5.1)
Sodium: 141 mmol/L (ref 135–145)
Total Bilirubin: 0.6 mg/dL (ref 0.3–1.2)
Total Protein: 6.7 g/dL (ref 6.5–8.1)

## 2021-06-07 LAB — URINALYSIS, ROUTINE W REFLEX MICROSCOPIC
Bacteria, UA: NONE SEEN
Bilirubin Urine: NEGATIVE
Glucose, UA: 50 mg/dL — AB
Ketones, ur: NEGATIVE mg/dL
Leukocytes,Ua: NEGATIVE
Nitrite: NEGATIVE
Protein, ur: >=300 mg/dL — AB
Specific Gravity, Urine: 1.008 (ref 1.005–1.030)
pH: 6 (ref 5.0–8.0)

## 2021-06-07 LAB — CBC
HCT: 35.2 % — ABNORMAL LOW (ref 39.0–52.0)
Hemoglobin: 11.3 g/dL — ABNORMAL LOW (ref 13.0–17.0)
MCH: 31.6 pg (ref 26.0–34.0)
MCHC: 32.1 g/dL (ref 30.0–36.0)
MCV: 98.3 fL (ref 80.0–100.0)
Platelets: 212 10*3/uL (ref 150–400)
RBC: 3.58 MIL/uL — ABNORMAL LOW (ref 4.22–5.81)
RDW: 13.9 % (ref 11.5–15.5)
WBC: 4.8 10*3/uL (ref 4.0–10.5)
nRBC: 0 % (ref 0.0–0.2)

## 2021-06-07 LAB — POC OCCULT BLOOD, ED: Fecal Occult Bld: POSITIVE — AB

## 2021-06-07 LAB — LIPASE, BLOOD: Lipase: 180 U/L — ABNORMAL HIGH (ref 11–51)

## 2021-06-07 MED ORDER — FUROSEMIDE 10 MG/ML IJ SOLN
200.0000 mg | Freq: Once | INTRAVENOUS | Status: AC
  Administered 2021-06-07: 200 mg via INTRAVENOUS
  Filled 2021-06-07: qty 20

## 2021-06-07 NOTE — ED Provider Triage Note (Signed)
Emergency Medicine Provider Triage Evaluation Note  [name] , a 36 y.o. male  was evaluated in triage.  Pt complains of rectal bleeding and abdominal pain.  Abdominal pain is been ongoing for the last 2 weeks but rectal bleeding started couple of days ago.  He typically gets dialysis Monday and Friday.  Went to dialysis today and was sent here.  He did not have a normal run.  Review of Systems  Positive:  Negative: See above   Physical Exam  BP (!) 153/96 (BP Location: Left Arm)    Pulse 97    Temp 98.5 F (36.9 C) (Oral)    Resp 18    SpO2 100%  Gen:   Awake, no distress   Resp:  Normal effort  MSK:   Moves extremities without difficulty  Other:  No abdominal tenderness  Medical Decision Making  Medically screening exam initiated at 3:05 PM.  Appropriate orders placed.  [name] was informed that the remainder of the evaluation will be completed by another provider, this initial triage assessment does not replace that evaluation, and the importance of remaining in the ED until their evaluation is complete.     [name], [place] 06/07/21 1507

## 2021-06-07 NOTE — Discharge Instructions (Addendum)
Please follow-up with your dialysis center tomorrow morning to receive dialysis.  Please also follow-up with your primary care physician if you are to continue having ongoing rectal bleeding.  You might benefit from a colonoscopy if this continues.  Also feel free to come back to the emergency department if you start to feel lightheaded, have shortness of breath, or have chest pain.
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
## 2021-06-07 NOTE — ED Provider Notes (Signed)
[facility] Emergency Department Provider Note MRN:  [id number]  Arrival date & time: 06/07/21     Chief Complaint   GI Bleeding   History of Present Illness   [name] is a 36 y.o. 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  The patient is deaf and much of our conversation was had by writing.  The patient reports that he has had bright red blood per rectum for the last few days.  The patient states that the outside portion of his stool has been coated with bright red blood.  The patient states that he has had mild abdominal pain for the last 2 weeks that has not worsened recently.  The patient states that he has never had hemorrhoids or anal fissures to his knowledge.  The patient states that he was instructed to come to the emergency department for evaluation rather than to attend his regularly scheduled dialysis session today.  The patient states that he is mildly short of breath but is not experiencing chest pain, fever, chills, or abdominal pain at this time.   Review of Systems  A thorough review of systems was obtained and all systems are negative except as noted in the HPI and PMH.   Patient's Health History    Past Medical History:  Diagnosis Date   Bipolar 1 disorder ([place])    Depression    GERD (gastroesophageal reflux disease)    Hearing difficulty of left ear    75% hearing   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   Hypertension    Renal disorder     Past Surgical History:  Procedure Laterality Date   APPENDECTOMY     AV FISTULA PLACEMENT Left 03/03/2019   Procedure: ARTERIOVENOUS (AV) FISTULA CREATION LEFT ARM;  Surgeon: Waynetta Sandy, MD;  Location: [facility] OR;  Service: Vascular;  Laterality: Left;   [place] Right 04/12/2019   Procedure: RIGHT UPPER EXTREMITY [place] FIRST STAGE FISTULA;  Surgeon: Angelia Mould, MD;  Location: [place];  Service: Vascular;  Laterality: Right;   LIGATION OF ARTERIOVENOUS  FISTULA Left 03/06/2019   Procedure: LIGATION OF ARTERIOVENOUS  FISTULA;  Surgeon: Marty Heck, MD;  Location: [place];  Service: Vascular;  Laterality: Left;   spinal tap     SPINE SURGERY     related to a spinal infection, unsure of surgery or infection source   WISDOM TOOTH EXTRACTION      Family History  Problem Relation Age of Onset   Hypertension Mother    Kidney failure Mother    Diabetes Mother    Heart disease Maternal Grandmother    Stroke Maternal Grandfather     Social History   Socioeconomic History   Marital status: Married    Spouse name: Not on file   Number of children: 3   Years of education: Not on file   Highest education level: Not on file  Occupational History   Occupation: HVAC  Tobacco Use   Smoking status: Former    Packs/day: 0.30    Types: Cigarettes    Quit date: 02/27/2016    Years since quitting: 5.2   Smokeless tobacco: Never  Vaping Use   Vaping Use: Never used  Substance and Sexual Activity   Alcohol use: Not Currently    Comment: rare  Drug use: Yes    Types: Marijuana   Sexual activity: Not Currently  Other Topics Concern   Not on file  Social History Narrative   Not on file   Social Determinants of Health   Financial Resource Strain: Not on file  Food Insecurity: Not on file  Transportation Needs: Not on file  Physical Activity: Not on file  Stress: Not on file  Social Connections: Not on file  Intimate Partner Violence: Not on file     Physical Exam   Physical Exam Vitals and nursing note reviewed.  Constitutional:      Appearance: Normal appearance.  Cardiovascular:     Rate and Rhythm: Normal rate and regular rhythm.  Pulmonary:     Effort: Pulmonary effort is normal.     Breath sounds: Normal breath sounds.  Abdominal:     General: Abdomen is  flat.     Palpations: Abdomen is soft.     Tenderness: There is no abdominal tenderness. There is no right CVA tenderness, left CVA tenderness, guarding or rebound.  Skin:    Capillary Refill: Capillary refill takes less than 2 seconds.  Neurological:     General: No focal deficit present.     Mental Status: [name].      Diagnostic and Interventional Summary    Labs Reviewed  LIPASE, BLOOD - Abnormal; Notable for the following components:      Result Value   Lipase 180 (*)    All other components within normal limits  COMPREHENSIVE METABOLIC PANEL - Abnormal; Notable for the following components:   Chloride 112 (*)    CO2 18 (*)    Glucose, Bld 108 (*)    BUN 78 (*)    Creatinine, Ser 12.61 (*)    Calcium 8.7 (*)    ALT 45 (*)    GFR, Estimated 5 (*)    All other components within normal limits  CBC - Abnormal; Notable for the following components:   RBC 3.58 (*)    Hemoglobin 11.3 (*)    HCT 35.2 (*)    All other components within normal limits  URINALYSIS, ROUTINE W REFLEX MICROSCOPIC - Abnormal; Notable for the following components:   Color, Urine STRAW (*)    Glucose, UA 50 (*)    Hgb urine dipstick SMALL (*)    Protein, ur >=300 (*)    All other components within normal limits  POC OCCULT BLOOD, ED - Abnormal; Notable for the following components:   Fecal Occult Bld POSITIVE (*)    All other components within normal limits    CT ABDOMEN PELVIS WO CONTRAST  Final Result    DG Chest Portable 1 View  Final Result      Medications  furosemide (LASIX) 200 mg in dextrose 5 % 50 mL IVPB (0 mg Intravenous Stopped 06/07/21 2119)     Procedures  /  Critical Care Procedures  ED Course and Medical Decision Making  Initial Impression and Ddx 36 year old male presenting to emergency department for evaluation of rectal bleeding.  Differential diagnosis includes was not limited to the following: Internal hemorrhoid, external hemorrhoid, anal fissure,  diverticulitis, aortoenteric fistula.  Past medical/surgical history that increases complexity of ED encounter: ESRD and Alport syndrome  Interpretation of Diagnostics I personally reviewed the Chest Xray and Cardiac Monitor and my interpretation is as follows: The patient was noted to be in normal sinus rhythm on the cardiac monitor and the patient's chest x-ray was consistent with vascular  congestion likely secondary to pulmonary edema.    The patient's hemoglobin was at his baseline on his CBC.  No leukocytosis present.  Patient did have a mild elevation in his lipase however he was not experiencing epigastric abdominal pain.  CT abdomen pelvis was obtained to further assess for pancreatitis.  No acute findings were noted on CT abdomen pelvis.  No significant hyperkalemia noted on metabolic panel.  Given that the patient has been without dialysis over the past week I spoke to the on-call nephrology attending who recommended giving 200 mg of IV Lasix here in the emergency department to temporize the patient until he could attend dialysis tomorrow morning.  She stated that she was set up for the dialysis appointment tomorrow morning.  I suspect that the patient has had a internal hemorrhoid which is causing him to have bright red blood per rectum.  Given that the patient is not tachycardic and not hypotensive I have low suspicion for clinically significant cause of his rectal bleeding at this time.  Patient Reassessment and Ultimate Disposition/Management On my reassessment the patient is resting comfortably without respiratory distress.  Stressed the importance of following up tomorrow at his dialysis session.  Patient voiced understanding of this.  We will discharge patient home with instructions to follow-up with both his nephrologist team and his primary care physician if he is to continue having rectal bleeding.  Patient management required discussion with the following services or consulting  groups:  Nephrology  Complexity of Problems Addressed Acute illness or injury that poses threat of life of bodily function  Additional Data Reviewed and Analyzed Further history obtained from: Recent PCP notes and Recent Consult notes  Factors Impacting ED Encounter Risk Consideration of hospitalization    Final Clinical Impressions(s) / ED Diagnoses     ICD-10-CM   1. Rectal bleeding  K62.5       ED Discharge Orders     None        Discharge Instructions Discussed with and Provided to Patient:     Discharge Instructions      Please follow-up with your dialysis center tomorrow morning to receive dialysis.  Please also follow-up with your primary care physician if you are to continue having ongoing rectal bleeding.  You might benefit from a colonoscopy if this continues.  Also feel free to come back to the emergency department if you start to feel lightheaded, have shortness of breath, or have chest pain.        Zachery Dakins, MD 06/07/21 7530    Lajean Saver, MD 06/08/21 [id number]

## 2021-06-10 ENCOUNTER — Other Ambulatory Visit: Payer: Self-pay

## 2021-06-10 ENCOUNTER — Ambulatory Visit (INDEPENDENT_AMBULATORY_CARE_PROVIDER_SITE_OTHER): Payer: Medicare Other | Admitting: Family Medicine

## 2021-06-10 VITALS — BP 126/68 | HR 90 | Ht 68.0 in | Wt 176.6 lb

## 2021-06-10 DIAGNOSIS — R7989 Other specified abnormal findings of blood chemistry: Secondary | ICD-10-CM | POA: Diagnosis not present

## 2021-06-10 DIAGNOSIS — R748 Abnormal levels of other serum enzymes: Secondary | ICD-10-CM

## 2021-06-10 DIAGNOSIS — R197 Diarrhea, unspecified: Secondary | ICD-10-CM

## 2021-06-10 DIAGNOSIS — R1084 Generalized abdominal pain: Secondary | ICD-10-CM

## 2021-06-10 DIAGNOSIS — K922 Gastrointestinal hemorrhage, unspecified: Secondary | ICD-10-CM

## 2021-06-10 LAB — POCT HEMOGLOBIN: Hemoglobin: 10.4 g/dL — AB (ref 11–14.6)

## 2021-06-10 NOTE — Patient Instructions (Signed)
It was a pleasure to see you today!  I have placed a referral for gastroenterology (GI). You should receive a phone call in 1-2 days to schedule this appointment. If for any reason you do not receive a phone call or need more help scheduling this appointment, please call our office at [phone]. If you have weakness, dizziness, intense abdominal pain that you cannot tolerate, you cannot keep any fluids down for 4 hours, a fever, please go to the emergency room Please complete the stool specimen and return at your follow up visit     Be Well,  Dr. Chauncey Reading

## 2021-06-10 NOTE — Progress Notes (Signed)
° ° °  SUBJECTIVE:   CHIEF COMPLAINT / HPI:   GI bleeding: patient reports that he has had cramping abdominal pain and has had numerous bloody bowel movements since Last Wednesday (1/25). He reports having 7-9 episodes of diarrhea with bright red blood that fills the bowl and occasional blood clots. He denies headaches, fever, chills, runny nose, cough, nausea, vomiting. He is able to eat and drink, but reports having immediate diarrhea. In ED on 06/07/21 Hgb down to 11.3, that is improved from 4 months ago 10.9; FOBT positive; ALT mildly elevated to 52, lipase elevated to 180. CT a/p with no "acute localizing process in the abdomen or pelvis." Patient does have Alport disease, which can have leiomyomatosis in the GI tract.  Of note, patient is deaf. He is wearing hearing aid today and can read lips, decline ASL interpreter.  PERTINENT  PMH / PSH: ESRD on HD, Alport syndrome  OBJECTIVE:   BP 126/68    Pulse 90    Ht 5\' 8"  (1.727 m)    Wt 176 lb 9.6 oz (80.1 kg)    SpO2 98%    BMI 26.85 kg/m   Nursing note and vitals reviewed GEN: well-appearing, adult AAM, resting comfortably in chair, NAD, WNWD HEENT: NCAT. PERRLA. Sclera without injection or icterus. MMM.  Neck: Supple. No LAD Cardiac: Regular rate and rhythm. Normal S1/S2. No murmurs, rubs, or gallops appreciated. 2+ radial pulses. Lungs: Clear bilaterally to ascultation. No increased WOB, no accessory muscle usage. No w/r/r. Abdomen: Normoactive bowel sounds. No tenderness to deep or light palpation. No rebound or guarding.    Neuro: AOx3  Ext: no edema Psych: Pleasant and appropriate   ASSESSMENT/PLAN:   Bloody diarrhea Unable to obtain labs due to lab closure time. POC hgb shows 10.4, down from 11.3 on 11/27. Patient appears to be well hydrated on exam, but with this amount of bloody diarrhea, patient may only be able to compensate for a few more days. I encouraged a liquid diet, though patient is ESRD on HD. Discussed red flags,  return precautions, and supportive care. Ddx includes infection (given GI pathogen panel), lower GI bleed (AVM, diverticulitis), leiomyomatosis (Alport syndrome), IBD, or other malignancy. Patient will need urgent evaluation by GI, order placed. Will have patient follow up closely in clinic, appt made for Wednesday, 2/1.     Gladys Damme, MD [place]

## 2021-06-10 NOTE — Assessment & Plan Note (Signed)
Unable to obtain labs due to lab closure time. POC hgb shows 10.4, down from 11.3 on 11/27. Patient appears to be well hydrated on exam, but with this amount of bloody diarrhea, patient may only be able to compensate for a few more days. I encouraged a liquid diet, though patient is ESRD on HD. Discussed red flags, return precautions, and supportive care. Ddx includes infection (given GI pathogen panel), lower GI bleed (AVM, diverticulitis), leiomyomatosis (Alport syndrome), IBD, or other malignancy. Patient will need urgent evaluation by GI, order placed. Will have patient follow up closely in clinic, appt made for Wednesday, 2/1.

## 2021-06-10 NOTE — Assessment & Plan Note (Signed)
>>  ASSESSMENT AND PLAN FOR BLOODY DIARRHEA WRITTEN ON 06/10/2021  5:33 PM BY MAHONEY, CAITLIN, MD  Unable to obtain labs due to lab closure time. POC hgb shows 10.4, down from 11.3 on 11/27. Patient appears to be well hydrated on exam, but with this amount of bloody diarrhea, patient may only be able to compensate for a few more days. I encouraged a liquid diet, though patient is ESRD on HD. Discussed red flags, return precautions, and supportive care. Ddx includes infection (given GI pathogen panel), lower GI bleed (AVM, diverticulitis), leiomyomatosis (Alport syndrome), IBD, or other malignancy. Patient will need urgent evaluation by GI, order placed. Will have patient follow up closely in clinic, appt made for Wednesday, 2/1.

## 2021-06-11 ENCOUNTER — Other Ambulatory Visit: Payer: Self-pay | Admitting: Family Medicine

## 2021-06-11 DIAGNOSIS — R197 Diarrhea, unspecified: Secondary | ICD-10-CM

## 2021-06-11 NOTE — Progress Notes (Signed)
Added order for GI pathogen panel.  Gladys Damme, MD [place], PGY-3

## 2021-06-11 NOTE — Progress Notes (Signed)
° ° ° °  SUBJECTIVE:   CHIEF COMPLAINT / HPI:   [name] is a 36 y.o. male presents for bloody diarrhea  Patient does not use sign language interpreter.  Encounter performed through patient lipreading.  Bloody diarrhea Pt reports bloody diarrhea for the last 2 weeks ago. He has the diarrhea 2-3 times a day.  Stool is mixed in with light red blood. He also has it at night too. Pt endorses epigastric/central abdominal pain after having BM and also feels dizziness too. Denies melena, vomiting, nausea, fevers, dyspnea, chest pain etc. He is able to eat and drink but then has diarrhea immediately.  It is now improving compared to a few days ago. He went to the ER on 1/27 for the same problem. CT abdomen pelvis at this time did not show any acute process but just stable bilateral renal atrophy and patchy opacities in the lungs reflecting infection, FOBT positive.  Was seen in the clinic by Dr. Chauncey Reading on 1/31.  POC Hb was 10.4 this time.  Unable to get full lab panel at this time so presents today for further lab work.  Patient denies history of IBD. Does not use ASA/NSAIDs/anticoagulants. No recent antibiotic use. t goes to HD 2-3 times a week and is compliant.  [facility] Office Visit from 06/10/2021 in [facility]  PHQ-9 Total Score 0        PERTINENT  PMH / PSH: alports syndrome, deafness, hypertension  OBJECTIVE:   BP 121/80    Pulse 98    Wt 172 lb 12.8 oz (78.4 kg)    SpO2 97%    BMI 26.27 kg/m    General: Alert, no acute distress, pleasant Cardio: Normal S1 and S2, RRR, no r/m/g Pulm: CTAB, normal work of breathing Abdomen: Bowel sounds normal. Abdomen soft and non-tender.  Extremities: No peripheral edema.  Neuro: Cranial nerves grossly intact   ASSESSMENT/PLAN:   Bloody diarrhea Slightly improved from a few days ago however patient still has ongoing symptoms.  POC Hb 12.3 today, improved from 10.4 few days ago.  Patient is hemodynamically stable.   Unclear cause of bloody diarrhea, broad differentials including gastroenteritis, ischemic colitis, IBD, hemorrhoids, anal fissure etc. Urgent referral for GI has already been placed. He will need a colonoscopy.  Patient has follow-up with GI tomorrow.  Obtained CBC, CMP, lipase today.  GI pathogen panel results pending. I also recommended patient starts Protonix 40 mg in case of a upper GI bleed. Strict ER precautions given to patient    Lattie Haw, MD PGY-3 [place]

## 2021-06-12 ENCOUNTER — Ambulatory Visit (INDEPENDENT_AMBULATORY_CARE_PROVIDER_SITE_OTHER): Payer: Medicare Other | Admitting: Family Medicine

## 2021-06-12 ENCOUNTER — Other Ambulatory Visit: Payer: Self-pay

## 2021-06-12 VITALS — BP 121/80 | HR 98 | Wt 172.8 lb

## 2021-06-12 DIAGNOSIS — R197 Diarrhea, unspecified: Secondary | ICD-10-CM

## 2021-06-12 DIAGNOSIS — K922 Gastrointestinal hemorrhage, unspecified: Secondary | ICD-10-CM

## 2021-06-12 LAB — POCT HEMOGLOBIN: Hemoglobin: 12.3 g/dL (ref 11–14.6)

## 2021-06-12 MED ORDER — PANTOPRAZOLE SODIUM 40 MG PO TBEC: 40.0000 mg | DELAYED_RELEASE_TABLET | Freq: Every day | ORAL | 3 refills | Status: DC

## 2021-06-12 NOTE — Patient Instructions (Signed)
Thank you for coming to see me today. It was a pleasure. Today we discussed your rectal bleeding. I do not know the cause-could be an infection or another cause. I recommend seeign GI dcotros for colonoscopy and endoscopy. We will follow up on the stool sample you gave.  We will get some labs today.  If they are abnormal or we need to do something about them, I will call you.  If they are normal, I will send you a message on MyChart (if it is active) or a letter in the mail.  If you don't hear from [place] in 2 weeks, please call the office at the number below.   If you feel dizzy, oozing more blood. then go to the ER.  Please follow-up with GI doctors ASAP  If you have any questions or concerns, please do not hesitate to call the office at [phone].  Best wishes,   Dr Posey Pronto

## 2021-06-13 ENCOUNTER — Encounter: Payer: Self-pay | Admitting: Physician Assistant

## 2021-06-13 ENCOUNTER — Ambulatory Visit (INDEPENDENT_AMBULATORY_CARE_PROVIDER_SITE_OTHER): Payer: Medicare Other | Admitting: Physician Assistant

## 2021-06-13 ENCOUNTER — Other Ambulatory Visit (INDEPENDENT_AMBULATORY_CARE_PROVIDER_SITE_OTHER): Payer: Medicare Other

## 2021-06-13 VITALS — BP 138/88 | HR 92 | Ht 68.5 in | Wt 175.0 lb

## 2021-06-13 DIAGNOSIS — K625 Hemorrhage of anus and rectum: Secondary | ICD-10-CM | POA: Diagnosis not present

## 2021-06-13 DIAGNOSIS — R197 Diarrhea, unspecified: Secondary | ICD-10-CM | POA: Diagnosis not present

## 2021-06-13 DIAGNOSIS — D649 Anemia, unspecified: Secondary | ICD-10-CM

## 2021-06-13 DIAGNOSIS — R109 Unspecified abdominal pain: Secondary | ICD-10-CM

## 2021-06-13 LAB — CBC WITH DIFFERENTIAL/PLATELET
Basophils Absolute: 0.1 10*3/uL (ref 0.0–0.1)
Basophils Relative: 2.1 % (ref 0.0–3.0)
Eosinophils Absolute: 0.4 10*3/uL (ref 0.0–0.7)
Eosinophils Relative: 7.6 % — ABNORMAL HIGH (ref 0.0–5.0)
HCT: 35.8 % — ABNORMAL LOW (ref 39.0–52.0)
Hemoglobin: 11.7 g/dL — ABNORMAL LOW (ref 13.0–17.0)
Lymphocytes Relative: 36.3 % (ref 12.0–46.0)
Lymphs Abs: 1.7 10*3/uL (ref 0.7–4.0)
MCHC: 32.8 g/dL (ref 30.0–36.0)
MCV: 93.9 fl (ref 78.0–100.0)
Monocytes Absolute: 0.4 10*3/uL (ref 0.1–1.0)
Monocytes Relative: 7.7 % (ref 3.0–12.0)
Neutro Abs: 2.1 10*3/uL (ref 1.4–7.7)
Neutrophils Relative %: 46.3 % (ref 43.0–77.0)
Platelets: 194 10*3/uL (ref 150.0–400.0)
RBC: 3.81 Mil/uL — ABNORMAL LOW (ref 4.22–5.81)
RDW: 15.1 % (ref 11.5–15.5)
WBC: 4.6 10*3/uL (ref 4.0–10.5)

## 2021-06-13 LAB — COMPREHENSIVE METABOLIC PANEL
ALT: 37 IU/L (ref 0–44)
AST: 37 IU/L (ref 0–40)
Albumin/Globulin Ratio: 2.1 (ref 1.2–2.2)
Albumin: 5 g/dL (ref 4.0–5.0)
Alkaline Phosphatase: 108 IU/L (ref 44–121)
BUN/Creatinine Ratio: 4 — ABNORMAL LOW (ref 9–20)
BUN: 32 mg/dL — ABNORMAL HIGH (ref 6–20)
Bilirubin Total: 0.6 mg/dL (ref 0.0–1.2)
CO2: 27 mmol/L (ref 20–29)
Calcium: 9.9 mg/dL (ref 8.7–10.2)
Chloride: 94 mmol/L — ABNORMAL LOW (ref 96–106)
Creatinine, Ser: 8.03 mg/dL — ABNORMAL HIGH (ref 0.76–1.27)
Globulin, Total: 2.4 g/dL (ref 1.5–4.5)
Glucose: 80 mg/dL (ref 70–99)
Potassium: 4.3 mmol/L (ref 3.5–5.2)
Sodium: 140 mmol/L (ref 134–144)
Total Protein: 7.4 g/dL (ref 6.0–8.5)
eGFR: 8 mL/min/{1.73_m2} — ABNORMAL LOW (ref >59)

## 2021-06-13 LAB — CBC
Hematocrit: 35.9 % — ABNORMAL LOW (ref 37.5–51.0)
Hemoglobin: 12 g/dL — ABNORMAL LOW (ref 13.0–17.7)
MCH: 31.5 pg (ref 26.6–33.0)
MCHC: 33.4 g/dL (ref 31.5–35.7)
MCV: 94 fL (ref 79–97)
Platelets: 224 10*3/uL (ref 150–450)
RBC: 3.81 x10E6/uL — ABNORMAL LOW (ref 4.14–5.80)
RDW: 13.7 % (ref 11.6–15.4)
WBC: 4.4 10*3/uL (ref 3.4–10.8)

## 2021-06-13 LAB — LIPASE: Lipase: 519 U/L — ABNORMAL HIGH (ref 13–78)

## 2021-06-13 LAB — C-REACTIVE PROTEIN: CRP: <1 mg/dL (ref 0.5–20.0)

## 2021-06-13 LAB — SEDIMENTATION RATE: Sed Rate: 14 mm/hr (ref 0–15)

## 2021-06-13 MED ORDER — DICYCLOMINE HCL 10 MG PO CAPS: ORAL_CAPSULE | ORAL | 1 refills | Status: DC

## 2021-06-13 NOTE — Patient Instructions (Addendum)
If you are age 34 or younger, your body mass index should be between 19-25. Your Body mass index is 26.22 kg/m. If this is out of the aformentioned range listed, please consider follow up with your Primary Care Provider.  ________________________________________________________  The [facility] providers would like to encourage you to use [facility] to communicate with providers for non-urgent requests or questions.  Due to long hold times on the telephone, sending your provider a message by [facility] may be a faster and more efficient way to get a response.  Please allow 48 business hours for a response.  Please remember that this is for non-urgent requests.  _______________________________________________________  We will call you when we can find a date for you to have your Colonoscopy at the hospital.  START dicyclomine 10 mg 1 tablet 2-3 times a day for abdominal cramping/diarrhea.  Pick up RectiCare  over the counter. Apply rectally 2-3 times daily for itching.  Your provider has requested that you go to the basement level for lab work before leaving today. Press "B" on the elevator. The lab is located at the first door on the left as you exit the elevator.  Follow up pending the results of your Colonoscopy.  Thank you for entrusting me with your care and choosing [facility].  Amy Esterwood, PA-C

## 2021-06-13 NOTE — Progress Notes (Signed)
Subjective:    Patient ID: [name], male    DOB: [birth year], 36 y.o.   MRN: [id number]  HPI [name] is a pleasant 36 year old African-American male, referred by [facility]/Dr. Precious Haws DO for evaluation of rectal bleeding and diarrhea. Patient has not had any prior GI evaluation. 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 He had an ER visit on 06/07/2021 due to cute onset of rectal bleeding.  Apparently he had been bleeding for a few days prior to going to the emergency room and describes associated diarrhea and abdominal pain/cramping.  He says he had 1 episode of incontinence during the night after onset.  He is not able to tell me exactly how many bowel movements he is having per day but says he already had several today.  He describes the stool as being liquid and brown mixed with blood and some clots, no melena.  Diarrhea is persisting over the past week.  He says he had a similar episode about a year ago which only lasted for short period of time and then resolved. No associated fever or chills, no nausea or vomiting, appetite is okay. He mentions that he had been on Arixtra previously but this was stopped due to nausea.  He now takes Tums with his meals. No recent new medications antibiotics etc. Family history is negative for GI disease as far as he is aware. CT abdomen and pelvis done 06/03/2021 showed no acute process in the abdomen pelvis, no evidence of colitis.  There was a 2 cm left renal cyst. Labs done on 06/07/2021 hemoglobin 11.3/hematocrit 35 which is about his baseline, documented heme positive, WBC 4.8 Repeat hemoglobin on 06/10/2021 equal 10.4 Repeat labs yesterday-hemoglobin 12/hematocrit 35.9, BUN 32/creatinine 8.0, lipase 519 GI path panel ordered by PCP and pending, patient  says he turned that in earlier today    Review of Systems Pertinent positive and negative review of systems were noted in the above HPI section.  All other review of systems was otherwise negative.   Outpatient Encounter Medications as of 06/13/2021  Medication Sig   amLODipine (NORVASC) 10 MG tablet Take 1 tablet (10 mg total) by mouth daily.   AURYXIA 1 GM 210 MG(Fe) tablet Take 210 mg by mouth 3 (three) times daily with meals.   calcitRIOL (ROCALTROL) 0.25 MCG capsule Take 0.25 mcg by mouth daily.   dicyclomine (BENTYL) 10 MG capsule Take 1 tablet 2-3 times a day as needed for abdominal cramping and/or diarrhea.   pantoprazole (PROTONIX) 40 MG tablet Take 1 tablet (40 mg total) by mouth daily.   triamcinolone ointment (KENALOG) 0.1 % Apply 1 application topically 2 (two) times daily.   [DISCONTINUED] Methoxy PEG-Epoetin Beta (MIRCERA IJ) Mircera   No facility-administered encounter medications on file as of 06/13/2021.   Allergies  Allergen Reactions   Lisinopril Swelling   Nsaids Other (See Comments)    "Kidney problems "   Other Other (See Comments)    Patient stated that he goes to [name] for his kidney problems and only takes medication that she gives him.    Risperidone     Other reaction(s): Unknown   Patient Active Problem List   Diagnosis Date Noted   Bloody diarrhea 06/10/2021   Family history of diabetes mellitus 02/11/2021   Hypoglycemic syndrome 02/11/2021   Rash and nonspecific skin  eruption 12/16/2019   Rash on scrotum 12/16/2019   Hypertension 06/07/2018   ESRD (end stage renal disease) ([place]) 06/07/2018   Alport syndrome 07/14/2017   GERD (gastroesophageal reflux disease) 07/14/2017   History of appendectomy 07/14/2017   Obesity (BMI 30.0-34.9) 07/14/2017   Pre-transplant evaluation for kidney transplant 07/14/2017   Secondary hyperparathyroidism ([place]) 07/14/2017   Tobacco use 07/14/2017   Bipolar 1 disorder ([place]) 09/01/2013   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/2013   Social History   Socioeconomic History   Marital status: Married    Spouse name: Not on file   Number of children: 3   Years of education: Not on file   Highest education level: Not on file  Occupational History   Occupation: HVAC  Tobacco Use   Smoking status: Former    Packs/day: 0.30    Types: Cigarettes    Quit date: 02/27/2016    Years since quitting: 5.2   Smokeless tobacco: Never  Vaping Use   Vaping Use: Never used  Substance and Sexual Activity   Alcohol use: Not Currently    Comment: rare   Drug use: Yes    Types: Marijuana   Sexual activity: Not Currently  Other Topics Concern   Not on file  Social History Narrative   Not on file   Social Determinants of Health   Financial Resource Strain: Not on file  Food Insecurity: Not on file  Transportation Needs: Not on file  Physical Activity: Not on file  Stress: Not on file  Social Connections: Not on file  Intimate Partner Violence: Not on file    [name]'s family history includes Asthma in his son; Diabetes in his mother; Hearing loss in his father; Heart disease in his maternal grandmother; Hypertension in his mother and sister; Kidney failure in his mother; Stroke in his maternal grandfather.      Objective:    Vitals:   06/13/21 1440  BP: 138/88  Pulse: 92    Physical Exam Well-developed well-nourished African-American male, pleasant in no acute distress.  He is very hard of hearing, does well with lipreading Weight, 175 BMI 26.2  HEENT; nontraumatic normocephalic, EOMI, PE R LA, sclera anicteric. Oropharynx; not examined today Neck; supple, no JVD Cardiovascular; regular rate and rhythm with S1-S2, no murmur rub or gallop Pulmonary; Clear bilaterally Abdomen; soft, there is some mild tenderness across the mid abdomen ,nondistended, no palpable mass or hepatosplenomegaly, bowel sounds are active Rectal; no external lesions noted, no stool or blood in the rectal vault Skin; benign  exam, no jaundice rash or appreciable lesions Extremities; no clubbing cyanosis or edema skin warm and dry Neuro/Psych; alert and oriented x4, grossly nonfocal mood and affect appropriate        Assessment & Plan:   #10 36 year old African-American male, with end-stage renal disease on dialysis Mondays and Fridays with 10-day history of abdominal pain,/cramping, diarrhea, and bright red blood mixed with bowel movements as well as small clots. Patient had 1 g drop in hemoglobin over about 4 days, however repeat hemoglobin yesterday back up to 12.  CT of the abdomen pelvis with contrast 06/03/2021-no acute process identified  Rule out infectious colitis, rule out possible mild ischemic colitis, rule out new onset IBD\  #2 Alport disease 3.  Very hard of hearing 4.  Hypertension 5.  Bipolar disorder  Plan; await GI path panel Sed rate and CRP today Start trial of Bentyl 10 mg p.o. twice daily to 3 times daily as needed for cramping  and diarrhea Patient will likely need to be scheduled for colonoscopy which will need to be done at the hospital.  I discussed colonoscopy in detail with the patient, including indications risks and benefits and he is agreeable to proceed.  Patient will be established with Dr. Loletha Carrow.  This will need to be scheduled on a nondialysis day. (Dr. Loletha Carrow does not have any availability-if GI path panel is negative, will need to coordinate a hospital spot within the next 2 weeks for this patient.)  Shawn Carattini Genia Harold PA-C 06/13/2021   Cc: [name], DO

## 2021-06-14 LAB — GI PROFILE, STOOL, PCR

## 2021-06-14 NOTE — Assessment & Plan Note (Addendum)
Slightly improved from a few days ago however patient still has ongoing symptoms.  POC Hb 12.3 today, improved from 10.4 few days ago.  Patient is hemodynamically stable.  Unclear cause of bloody diarrhea, broad differentials including gastroenteritis, ischemic colitis, IBD, hemorrhoids, anal fissure etc. Urgent referral for GI has already been placed. He will need a colonoscopy.  Patient has follow-up with GI tomorrow.  Obtained CBC, CMP, lipase today.  GI pathogen panel results pending. I also recommended patient starts Protonix 40 mg in case of a upper GI bleed. Strict ER precautions given to patient

## 2021-06-14 NOTE — Assessment & Plan Note (Signed)
>>  ASSESSMENT AND PLAN FOR BLOODY DIARRHEA WRITTEN ON 06/14/2021 12:39 PM BY PATEL, POONAM, MD  Slightly improved from a few days ago however patient still has ongoing symptoms.  POC Hb 12.3 today, improved from 10.4 few days ago.  Patient is hemodynamically stable.  Unclear cause of bloody diarrhea, broad differentials including gastroenteritis, ischemic colitis, IBD, hemorrhoids, anal fissure etc. Urgent referral for GI has already been placed. He will need a colonoscopy.  Patient has follow-up with GI tomorrow.  Obtained CBC, CMP, lipase today.  GI pathogen panel results pending. I also recommended patient starts Protonix 40 mg in case of a upper GI bleed. Strict ER precautions given to patient

## 2021-06-18 NOTE — Progress Notes (Signed)
____________________________________________________________  Attending physician addendum:  Thank you for sending this case to me. I have reviewed the entire note and agree with the plan.  Yes, his colonoscopy must be done in hospital outpatient setting due to ESRD on HD. We will look at the upcoming schedules to see who can accommodate.  Wilfrid Lund, MD  ____________________________________________________________

## 2021-06-19 ENCOUNTER — Telehealth: Payer: Self-pay

## 2021-06-19 NOTE — Telephone Encounter (Signed)
Spoke with the patient. He reports he is better if he takes the medication. If he does not take the medication, then "I am going and going, it's bad." Dialysis days are Mondays Wednesdays and Fridays.   Amy Esterwood's schedule is full. No APP schedule past next week. Appointment scheduled with the patient's primary GI.

## 2021-06-19 NOTE — Telephone Encounter (Addendum)
-----   Message from Alfredia Ferguson, PA-C sent at 06/19/2021  9:06 AM EST ----- Regarding: RE: [facility] colonoscopy Lets get him a follow up office visit with me  please ----- Message ----- From: Greggory Keen, LPN Sent: 07/17/1694   2:49 PM EST To: Alfredia Ferguson, PA-C Subject: RE: [name] colonoscopy                             He said he was better. I documented it on his result note.  ----- Message ----- From: Alfredia Ferguson, PA-C Sent: 06/18/2021  10:24 AM EST To: Greggory Keen, LPN, Nelida Meuse III, MD Subject: RE: [name] colonoscopy                             Beth - please call pt , and let him know the GI path panel to look for infection was negative - need to know if still having cramping/ pain, diarrhea or bleeding - if sxs have resloved then he may not need Colonoscopy  ----- Message ----- From: Doran Stabler, MD Sent: 06/18/2021   6:31 AM EST To: Alfredia Ferguson, PA-C Subject: [facility] colonoscopy                                 Amy,    As always, hospital outpatient scope time is tight.  If it helps, I am purple doc the week of Feb 20th.  Although my 20th and 21st AM yellow blocks are full, if something can be arranged early afternoon the 21st or 23rd, I will available as purple doc.   - HD

## 2021-06-25 ENCOUNTER — Ambulatory Visit (INDEPENDENT_AMBULATORY_CARE_PROVIDER_SITE_OTHER): Payer: Medicare Other | Admitting: Gastroenterology

## 2021-06-25 ENCOUNTER — Encounter: Payer: Self-pay | Admitting: Gastroenterology

## 2021-06-25 VITALS — BP 120/80 | HR 95 | Ht 68.0 in | Wt 170.0 lb

## 2021-06-25 DIAGNOSIS — K529 Noninfective gastroenteritis and colitis, unspecified: Secondary | ICD-10-CM | POA: Diagnosis not present

## 2021-06-25 DIAGNOSIS — K625 Hemorrhage of anus and rectum: Secondary | ICD-10-CM | POA: Diagnosis not present

## 2021-06-25 MED ORDER — ONDANSETRON HCL 4 MG PO TABS: 4.0000 mg | ORAL_TABLET | Freq: Three times a day (TID) | ORAL | 0 refills | Status: DC | PRN

## 2021-06-25 MED ORDER — PEG 3350-KCL-NA BICARB-NACL 420 G PO SOLR: 4000.0000 mL | Freq: Once | ORAL | 0 refills | Status: AC

## 2021-06-25 NOTE — Patient Instructions (Signed)
If you are age 34 or older, your body mass index should be between 23-30. Your Body mass index is 25.85 kg/m. If this is out of the aforementioned range listed, please consider follow up with your Primary Care Provider.  If you are age 70 or younger, your body mass index should be between 19-25. Your Body mass index is 25.85 kg/m. If this is out of the aformentioned range listed, please consider follow up with your Primary Care Provider.   ________________________________________________________  The [facility] providers would like to encourage you to use [facility] to communicate with providers for non-urgent requests or questions.  Due to long hold times on the telephone, sending your provider a message by [facility] may be a faster and more efficient way to get a response.  Please allow 48 business hours for a response.  Please remember that this is for non-urgent requests.  _______________________________________________________  [name] have been scheduled for a colonoscopy. Please follow written instructions given to you at your visit today.  Please pick up your prep supplies at the pharmacy within the next 1-3 days. If you use inhalers (even only as needed), please bring them with you on the day of your procedure.  Due to recent changes in healthcare laws, you may see the results of your imaging and laboratory studies on MyChart before your provider has had a chance to review them.  We understand that in some cases there may be results that are confusing or concerning to you. Not all laboratory results come back in the same time frame and the provider may be waiting for multiple results in order to interpret others.  Please give [place] 48 hours in order for your provider to thoroughly review all the results before contacting the office for clarification of your results.   It was a pleasure to see you today!  Thank you for trusting me with your gastrointestinal care!

## 2021-06-25 NOTE — Progress Notes (Signed)
[name] GI Progress Note  Chief Complaint: Chronic diarrhea and rectal bleeding  Subjective  History:  Office consult note of 06/13/21 again reviewed.  Patient was seen along with his aunt today and further history taken.  It sounds like he has had intermittent diarrhea over the last year.  He had a brief episode of bleeding last year that resolved and his bleeding that occurred a few weeks back is also resolved.  Recent symptoms came on acutely without any clear triggers, no new medicines, sick contacts or travel. His dialysis schedule will soon change from Monday and Friday to Tuesday and Saturday. As long as he takes dicyclomine 3 times daily he is doing well, but if he misses a dose or 2 he has recurrent diarrhea.  ROS: Cardiovascular:  no chest pain Respiratory: no dyspnea Hard of hearing (reads lips and able to communicate well today)  The patient's Past Medical, Family and Social History were reviewed and are on file in the EMR. Past Medical History:  Diagnosis Date   Bipolar 1 disorder ([place])    CKD (chronic kidney disease)    on dialysis   Depression    GERD (gastroesophageal reflux disease)    Hearing difficulty of left ear    75% hearing   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   Hypertension    Low blood sugar    Renal disorder    Past Surgical History:  Procedure Laterality Date   APPENDECTOMY     AV FISTULA PLACEMENT Left 03/03/2019   Procedure: ARTERIOVENOUS (AV) FISTULA CREATION LEFT ARM;  Surgeon: Waynetta Sandy, MD;  Location: [facility];  Service: Vascular;  Laterality: Left;   [place] Right 04/12/2019   Procedure: RIGHT UPPER EXTREMITY [place];  Surgeon: Angelia Mould, MD;  Location: [facility];  Service: Vascular;  Laterality: Right;   LIGATION OF ARTERIOVENOUS  FISTULA Left 03/06/2019   Procedure: LIGATION OF ARTERIOVENOUS  FISTULA;  Surgeon: Marty Heck, MD;   Location: [facility];  Service: Vascular;  Laterality: Left;   spinal tap     SPINE SURGERY     related to a spinal infection, unsure of surgery or infection source   WISDOM TOOTH EXTRACTION      Objective:  Med list reviewed  Current Outpatient Medications:    amLODipine (NORVASC) 10 MG tablet, Take 1 tablet (10 mg total) by mouth daily., Disp: 90 tablet, Rfl: 3   AURYXIA 1 GM 210 MG(Fe) tablet, Take 210 mg by mouth 3 (three) times daily with meals., Disp: , Rfl:    calcitRIOL (ROCALTROL) 0.25 MCG capsule, Take 0.25 mcg by mouth daily., Disp: , Rfl:    dicyclomine (BENTYL) 10 MG capsule, Take 1 tablet 2-3 times a day as needed for abdominal cramping and/or diarrhea., Disp: 60 capsule, Rfl: 1   ondansetron (ZOFRAN) 4 MG tablet, Take 1 tablet (4 mg total) by mouth every 8 (eight) hours as needed for nausea or vomiting., Disp: 2 tablet, Rfl: 0   pantoprazole (PROTONIX) 40 MG tablet, Take 1 tablet (40 mg total) by mouth daily., Disp: 30 tablet, Rfl: 3   polyethylene glycol-electrolytes (NULYTELY) 420 g solution, Take 4,000 mLs by mouth once for 1 dose., Disp: 4000 mL, Rfl: 0   triamcinolone ointment (KENALOG) 0.1 %, Apply 1 application topically 2 (two) times daily., Disp: 80 g, Rfl: 2   Vital signs in last 24 hrs: Vitals:   06/25/21 1544  BP: 120/80  Pulse: 95   Wt Readings from Last 3 Encounters:  06/25/21 170 lb (77.1 kg)  06/13/21 175 lb (79.4 kg)  06/12/21 172 lb 12.8 oz (78.4 kg)    Physical Exam  Well-appearing HEENT: sclera anicteric, oral mucosa moist without lesions Neck: supple, no thyromegaly, JVD or lymphadenopathy Cardiac: RRR without murmurs, S1S2 heard, no peripheral edema Pulm: clear to auscultation bilaterally, normal RR and effort noted Abdomen: soft, no tenderness, with active bowel sounds. No guarding or palpable hepatosplenomegaly.  No bruit Skin; warm and dry, no jaundice or rash Fistula left  arm Labs:   ___________________________________________ Radiologic studies:   ____________________________________________ Other:  Negative GI pathogen panel 06/11/2021 _____________________________________________ Assessment & Plan  Assessment: Encounter Diagnoses  Name Primary?   Chronic diarrhea Yes   Rectal bleeding    Diarrhea somewhat difficult to characterize, initially sounded of acute recent onset, but now sounds like he has had intermittently over the last year.  Recent rectal bleeding is stopped, indicating perhaps benign rectal source such as hemorrhoids rather than IBD.  Diarrhea controlled on dicyclomine but recurs if he misses it. Consider IBD, IBS, medication effect(? Auryxia, which has a reported significant incidence of diarrhea side effect) =-would have to communicate with nephrology to see when he started that.  Plan: Colonoscopy.  Described in detail along with diagram of the anatomy as well as risks and benefits and he was agreeable.  The benefits and risks of the planned procedure were described in detail with the patient or (when appropriate) their health care proxy.  Risks were outlined as including, but not limited to, bleeding, infection, perforation, adverse medication reaction leading to cardiac or pulmonary decompensation, pancreatitis (if ERCP).  The limitation of incomplete mucosal visualization was also discussed.  No guarantees or warranties were given.  Patient at increased risk for cardiopulmonary complications of procedure due to medical comorbidities. Procedure must be done in the hospital outpatient endoscopy lab due to his ESRD -scheduling shortages make my next available date in early April.  If there is a cancellation sooner we will offer to him.  If he develops recurrent and persistent rectal bleeding with declining hemoglobin before that, he may require reevaluation at the ED.  Dialysis schedule the week of his procedure may need to be  altered to accommodate the endoscopy availability.  This would require coordination with his dialysis physician.  Split dose GoLytely prep because of ESRD, 2 doses of Zofran prescribed to take before evening and a.m. prep doses.   42 minutes were spent on this encounter (including extensive chart review, history/exam, counseling/coordination of care, and documentation) > 50% of that time was spent on counseling and coordination of care.   Nelida Meuse III

## 2021-08-12 ENCOUNTER — Encounter (HOSPITAL_COMMUNITY): Payer: Self-pay | Admitting: Gastroenterology

## 2021-08-12 NOTE — Progress Notes (Signed)
Attempted to obtain medical history via telephone, unable to reach at this time. I left a voicemail to return pre surgical testing department's phone call.  

## 2021-08-19 ENCOUNTER — Encounter (HOSPITAL_COMMUNITY): Payer: Self-pay | Admitting: Anesthesiology

## 2021-08-19 NOTE — Anesthesia Preprocedure Evaluation (Deleted)
Anesthesia Evaluation  ? ? ?Reviewed: ?Allergy & Precautions, Patient's Chart, lab work & pertinent test results ? ?History of Anesthesia Complications ?Negative for: history of anesthetic complications ? ?Airway ? ? ? ? ? ? ? Dental ?  ?Pulmonary ?neg pulmonary ROS, former smoker,  ?  ? ? ? ? ? ? ? Cardiovascular ?hypertension, Pt. on medications ? ? ? ?  ?Neuro/Psych ?PSYCHIATRIC DISORDERS Bipolar Disorder negative neurological ROS ?   ? GI/Hepatic ?Neg liver ROS, GERD  ,  ?Endo/Other  ?negative endocrine ROS ? Renal/GU ?ESRFRenal disease  ?negative genitourinary ?  ?Musculoskeletal ?negative musculoskeletal ROS ?(+)  ? Abdominal ?  ?Peds ?negative pediatric ROS ?(+)  Hematology ?negative hematology ROS ?(+)   ?Anesthesia Other Findings ? ? Reproductive/Obstetrics ?negative OB ROS ? ?  ? ? ? ? ? ? ? ? ? ? ? ? ? ?  ?  ? ? ? ? ? ? ? ? ?Anesthesia Physical ? ?Anesthesia Plan ? ?ASA: 3 ? ?Anesthesia Plan: MAC  ? ?Post-op Pain Management:   ? ?Induction:  ? ?PONV Risk Score and Plan: Propofol infusion ? ?Airway Management Planned: Simple Face Mask and Natural Airway ? ?Additional Equipment:  ? ?Intra-op Plan:  ? ?Post-operative Plan:  ? ?Informed Consent:  ? ?Plan Discussed with: Anesthesiologist ? ?Anesthesia Plan Comments:   ? ? ? ? ? ? ?Anesthesia Quick Evaluation ? ?

## 2021-08-20 ENCOUNTER — Encounter (HOSPITAL_COMMUNITY): Admission: RE | Payer: Self-pay | Source: Home / Self Care

## 2021-08-20 ENCOUNTER — Encounter: Payer: Self-pay | Admitting: Gastroenterology

## 2021-08-20 ENCOUNTER — Ambulatory Visit (HOSPITAL_COMMUNITY): Admission: RE | Admit: 2021-08-20 | Payer: Medicare Other | Source: Home / Self Care | Admitting: Gastroenterology

## 2021-08-20 ENCOUNTER — Telehealth: Payer: Self-pay | Admitting: Gastroenterology

## 2021-08-20 SURGERY — COLONOSCOPY WITH PROPOFOL
Anesthesia: Monitor Anesthesia Care

## 2021-08-20 MED ORDER — PROPOFOL 10 MG/ML IV BOLUS
INTRAVENOUS | Status: AC
  Filled 2021-08-20: qty 20

## 2021-08-20 MED ORDER — PROPOFOL 500 MG/50ML IV EMUL
INTRAVENOUS | Status: AC
  Filled 2021-08-20: qty 50

## 2021-08-20 NOTE — Progress Notes (Signed)
No-show for colonoscopy at [facility] today. ?Endoscopy staff contacted patient/family and were told patient did not take prep and decided not to have the procedure.  He had not called the office prior to inform us and cancel. ? ?- H. Danis ?

## 2021-08-20 NOTE — Telephone Encounter (Signed)
Patient cancelled his procedure at [facility] this morning because he said he had dialysis yesterday, was out of it, and didn't get his prep done.  He needs to reschedule.  Please call patient and advise.  Thank you. ?

## 2021-08-20 NOTE — Telephone Encounter (Signed)
Vaughan Basta this is a [facility] se[facility]ologist patient, it should have gone to [place]. I am sending this to Dr. Loletha Carrow to advise on rescheduling. ?

## 2021-08-22 MED ORDER — METOCLOPRAMIDE HCL 5 MG PO TABS: 5.0000 mg | ORAL_TABLET | Freq: Two times a day (BID) | ORAL | 0 refills | Status: DC | PRN

## 2021-08-22 NOTE — Telephone Encounter (Signed)
Pt returned call. He states that he doesn't have a specific schedule for dialysis. He states that right now he does dialysis twice a week, Tuesday and Saturdays. Pt states that his schedule changes about every 2 weeks because the dialysis center is short staffed so some days he may have to go on a Monday. I told pt that the hospital dates are hard to come by and his new appt will more than likely be in June. He is aware that he may have to prep on a dialysis day. I told him that I will get this information to you and will give him a call back with recommendations. Pt verbalized understanding. ?

## 2021-08-22 NOTE — Telephone Encounter (Signed)
Lm on vm for patient to return call 

## 2021-08-22 NOTE — Telephone Encounter (Signed)
That is unfortunate, since these hospital procedure slots are difficult to get and this one went wasted. ? ?I will give one chance to reschedule if he is agreeable to prepare for the procedure as directed.  That may require doing bowel preparation on a dialysis day (after having morning dialysis). ? ?Please confirm his hemodialysis schedule and let me know, then I will find a date, which will most likely be June with our current backlog of hospital cases. ? ?- HD ?

## 2021-08-22 NOTE — Telephone Encounter (Signed)
My [place] date is Monday, June 5. ? ?He would need to have dialysis 2 days prior to that (on the Saturday), and he needs to make those arrangements with his dialysis center about 2 weeks prior so they can adjust his schedule accordingly.  If they have questions or concerns about that, they should contact me here at the office so I can speak with his dialysis physician. ? ?Same bowel prep and instructions as he was supposed to have for this exam (split dose GoLytely evening before and early a.m. of procedure) ? ?I will also send a prescription for 2 doses of metoclopramide 5 mg, take 1 dose an hour before his evening prep and 30 minutes before his morning prep ? ?HD ?

## 2021-08-23 NOTE — Telephone Encounter (Signed)
Lm on vm for patient to return call 

## 2021-08-23 NOTE — Telephone Encounter (Signed)
Returned call to patient. I reviewed Dr. Loletha Carrow' recommendations with patient. Pt has been scheduled for a colonoscopy at [facility] on Monday, 10/14/21 at 9:30 am. Pt will need to arrive at 8 am with a care partner. He is aware that I will send him updated instructions through [place] and I will place a copy in the mail. Pt is aware that an RX was sent in for him yesterday. He has been advised to pick that up and not take it until he begins his prep for his procedure. Pt verbalized understanding and had no concerns at the end of the call.  ? ?Secure staff message sent to [facility] about procedure date change. ?

## 2021-08-23 NOTE — Telephone Encounter (Signed)
Patient returned your phone call.

## 2021-09-23 ENCOUNTER — Encounter (HOSPITAL_COMMUNITY): Payer: Self-pay | Admitting: Gastroenterology

## 2021-10-04 ENCOUNTER — Other Ambulatory Visit: Payer: Self-pay | Admitting: Physician Assistant

## 2021-10-08 ENCOUNTER — Ambulatory Visit (INDEPENDENT_AMBULATORY_CARE_PROVIDER_SITE_OTHER): Payer: Medicare Other | Admitting: Family Medicine

## 2021-10-08 ENCOUNTER — Other Ambulatory Visit: Payer: Self-pay

## 2021-10-08 VITALS — BP 147/86 | HR 88 | Wt 168.6 lb

## 2021-10-08 DIAGNOSIS — R21 Rash and other nonspecific skin eruption: Secondary | ICD-10-CM

## 2021-10-08 DIAGNOSIS — H9190 Unspecified hearing loss, unspecified ear: Secondary | ICD-10-CM | POA: Insufficient documentation

## 2021-10-08 MED ORDER — TRIAMCINOLONE ACETONIDE 0.1 % EX OINT: 1.0000 "application " | TOPICAL_OINTMENT | Freq: Two times a day (BID) | CUTANEOUS | 1 refills | Status: AC

## 2021-10-08 NOTE — Progress Notes (Signed)
    SUBJECTIVE:   CHIEF COMPLAINT / HPI:   Penile and scrotal rash Ongoing for 5 years. Worsening intermittently over the last year. Improved with cream that was given. Has been out of several month. Having diffuse pruritis in the pelvic region. Has tried several soaps and different material of underwear. Not wearing underwear today to minimize irritation. Desires referral to a specialist. Denies fever, weight loss, and dysuria. Also denies history of STDs. Has been married for 10 years and declined STD testing at this time.   PERTINENT  PMH / PSH: Scrotal eczema, hearing impaired (reads lips)  OBJECTIVE:   BP (!) 147/86   Pulse 88   Wt 168 lb 9.6 oz (76.5 kg)   SpO2 100%   BMI 25.64 kg/m   General: Appears well, no acute distress. Age appropriate. Respiratory: normal effort Skin: Inguinal folds and thighs appears normal, no rash seen. Penis and scrotal sac have flesh color diffuse small papules without surrounding erythema. No visible edema or discharge. No visible lesions or insects in pubic hair. Woods lamp negative for erythrasma.  Neuro: alert and oriented Psych: normal affect   ASSESSMENT/PLAN:   Rash and nonspecific skin eruption Chronic. Intermittent and worsening over the last few months. Previously thought to be scrotal eczema. Rash without well demarcated borders having diffuse small papules over scrotum and sparingly on the penile shaft. Is significantly pruritic to the patient. Hx of skin sensitivity to hypoallergenic products such as dove sensitive. Etiology unknown at this time but consider scrotal eczema, erythrasma (woods lamp test appeared negative), lichen planus, lice irritation, contact dermatitis. Would benefit from referral to dermatology at this time given duration and worsening pruritis. Will refill ointment below for symptomatic relief. Minimize chronic use of steroids in the reproductive area.  - triamcinolone ointment (KENALOG) 0.1 %; Apply 1 application.  topically 2 (two) times daily for 28 days.  Dispense: 28 g; Refill: 1 - Ambulatory referral to Dermatology  Gerlene Fee, [place]

## 2021-10-08 NOTE — Patient Instructions (Signed)
It was wonderful to see you today.  Please bring ALL of your medications with you to every visit.   Today we talked about:  Using ointment for rash.  I have referred you to dermatology you should get a call within a week to get this scheduled.  Please call the clinic at [phone] if your symptoms worsen or you have any concerns. It was our pleasure to serve you.  Dr. Janus Molder

## 2021-10-10 ENCOUNTER — Telehealth: Payer: Self-pay

## 2021-10-10 NOTE — Telephone Encounter (Signed)
Attempted to reach patient on his mobile phone twice. His vm is full at this time and I am unable to leave a message.   Lm on wife's vm asking that patient give [place] a call back.

## 2021-10-10 NOTE — Telephone Encounter (Signed)
Pt returned call. He has confirmed his appt at [facility] for 10/14/21. Pt confirmed that he does have his instructions and prep. He is aware that he needs to arrive at [facility] by 7:45 am with a care partner. Patient verbalized understanding and had no concerns at the end of the call.

## 2021-10-14 ENCOUNTER — Encounter (HOSPITAL_COMMUNITY)
Admission: RE | Disposition: A | Discharge status: Home / Self Care | Payer: Self-pay | Source: Home / Self Care | Attending: Gastroenterology

## 2021-10-14 ENCOUNTER — Other Ambulatory Visit: Payer: Self-pay

## 2021-10-14 ENCOUNTER — Ambulatory Visit (HOSPITAL_COMMUNITY): Payer: Medicare Other | Admitting: Anesthesiology

## 2021-10-14 ENCOUNTER — Ambulatory Visit (HOSPITAL_COMMUNITY)
Admission: RE | Admit: 2021-10-14 | Discharge: 2021-10-14 | Disposition: A | Discharge status: Home / Self Care | Payer: Medicare Other | Attending: Gastroenterology | Admitting: Gastroenterology

## 2021-10-14 ENCOUNTER — Encounter (HOSPITAL_COMMUNITY): Payer: Self-pay | Admitting: Gastroenterology

## 2021-10-14 ENCOUNTER — Ambulatory Visit (HOSPITAL_BASED_OUTPATIENT_CLINIC_OR_DEPARTMENT_OTHER): Payer: Medicare Other | Admitting: Anesthesiology

## 2021-10-14 DIAGNOSIS — K5731 Diverticulosis of large intestine without perforation or abscess with bleeding: Secondary | ICD-10-CM

## 2021-10-14 DIAGNOSIS — D631 Anemia in chronic kidney disease: Secondary | ICD-10-CM | POA: Insufficient documentation

## 2021-10-14 DIAGNOSIS — Z87891 Personal history of nicotine dependence: Secondary | ICD-10-CM | POA: Insufficient documentation

## 2021-10-14 DIAGNOSIS — I120 Hypertensive chronic kidney disease with stage 5 chronic kidney disease or end stage renal disease: Secondary | ICD-10-CM | POA: Insufficient documentation

## 2021-10-14 DIAGNOSIS — K219 Gastro-esophageal reflux disease without esophagitis: Secondary | ICD-10-CM | POA: Diagnosis not present

## 2021-10-14 DIAGNOSIS — K625 Hemorrhage of anus and rectum: Secondary | ICD-10-CM | POA: Diagnosis not present

## 2021-10-14 DIAGNOSIS — F319 Bipolar disorder, unspecified: Secondary | ICD-10-CM | POA: Diagnosis not present

## 2021-10-14 DIAGNOSIS — Z992 Dependence on renal dialysis: Secondary | ICD-10-CM

## 2021-10-14 DIAGNOSIS — N186 End stage renal disease: Secondary | ICD-10-CM

## 2021-10-14 DIAGNOSIS — K529 Noninfective gastroenteritis and colitis, unspecified: Secondary | ICD-10-CM | POA: Insufficient documentation

## 2021-10-14 DIAGNOSIS — K5730 Diverticulosis of large intestine without perforation or abscess without bleeding: Secondary | ICD-10-CM | POA: Insufficient documentation

## 2021-10-14 HISTORY — PX: BIOPSY: SHX5522

## 2021-10-14 HISTORY — PX: COLONOSCOPY WITH PROPOFOL: SHX5780

## 2021-10-14 LAB — POCT I-STAT, CHEM 8
BUN: 43 mg/dL — ABNORMAL HIGH (ref 6–20)
Calcium, Ion: 1.06 mmol/L — ABNORMAL LOW (ref 1.15–1.40)
Chloride: 99 mmol/L (ref 98–111)
Creatinine, Ser: 14.3 mg/dL — ABNORMAL HIGH (ref 0.61–1.24)
Glucose, Bld: 85 mg/dL (ref 70–99)
HCT: 30 % — ABNORMAL LOW (ref 39.0–52.0)
Hemoglobin: 10.2 g/dL — ABNORMAL LOW (ref 13.0–17.0)
Potassium: 3.8 mmol/L (ref 3.5–5.1)
Sodium: 137 mmol/L (ref 135–145)
TCO2: 25 mmol/L (ref 22–32)

## 2021-10-14 SURGERY — COLONOSCOPY WITH PROPOFOL
Anesthesia: Monitor Anesthesia Care

## 2021-10-14 MED ORDER — PROPOFOL 10 MG/ML IV BOLUS
INTRAVENOUS | Status: DC | PRN
  Administered 2021-10-14 (×2): 20 mg via INTRAVENOUS
  Administered 2021-10-14: 10 mg via INTRAVENOUS
  Administered 2021-10-14 (×3): 20 mg via INTRAVENOUS

## 2021-10-14 MED ORDER — LACTATED RINGERS IV SOLN: INTRAVENOUS | Status: DC | PRN

## 2021-10-14 MED ORDER — ONDANSETRON HCL 4 MG/2ML IJ SOLN
INTRAMUSCULAR | Status: DC | PRN
  Administered 2021-10-14: 4 mg via INTRAVENOUS

## 2021-10-14 MED ORDER — ESMOLOL HCL 100 MG/10ML IV SOLN
INTRAVENOUS | Status: DC | PRN
  Administered 2021-10-14 (×2): 20 mg via INTRAVENOUS

## 2021-10-14 MED ORDER — LIDOCAINE HCL 1 % IJ SOLN
INTRAMUSCULAR | Status: DC | PRN
  Administered 2021-10-14: 50 mg via INTRADERMAL

## 2021-10-14 MED ORDER — PROPOFOL 500 MG/50ML IV EMUL
INTRAVENOUS | Status: DC | PRN
  Administered 2021-10-14: 175 ug/kg/min via INTRAVENOUS

## 2021-10-14 SURGICAL SUPPLY — 22 items

## 2021-10-14 NOTE — Op Note (Signed)
[facility] Patient Name: [name] Procedure Date: 10/14/2021 MRN: [id number] Attending MD: Estill Cotta. Loletha Carrow , MD Date of Birth: Sep 23, 1985 CSN: [id number] Age: 36 Admit Type: Outpatient Procedure:                Colonoscopy Indications:              Chronic diarrhea, Rectal bleeding (both symptoms                            stopped with dicyclomine) Providers:                Estill Cotta. Loletha Carrow, MD, Allayne Gitelman, RN, Benetta Spar, Technician Referring MD:             [name] Medicines:                Monitored Anesthesia Care Complications:            No immediate complications. Estimated Blood Loss:     Estimated blood loss: none. Estimated blood loss                            was minimal. Procedure:                Pre-Anesthesia Assessment:                           - Prior to the procedure, a History and Physical                            was performed, and patient medications and                            allergies were reviewed. The patient's tolerance of                            previous anesthesia was also reviewed. The risks                            and benefits of the procedure and the sedation                            options and risks were discussed with the patient.                            All questions were answered, and informed consent                            was obtained. Prior Anticoagulants: The patient has                            taken no previous anticoagulant or antiplatelet                            agents. ASA Grade Assessment:  III - A patient with                            severe systemic disease that is a constant threat                            to life (ESRD on HD). After reviewing the risks and                            benefits, the patient was deemed in satisfactory                            condition to undergo the procedure.                           After obtaining informed  consent, the colonoscope                            was passed under direct vision. Throughout the                            procedure, the patient's blood pressure, pulse, and                            oxygen saturations were monitored continuously. The                            CF-HQ190L ([id number]) Olympus colonoscope was                            introduced through the anus and advanced to the the                            terminal ileum, with identification of the                            appendiceal orifice and IC valve. The colonoscopy                            was somewhat difficult due to a redundant colon.                            Successful completion of the procedure was aided by                            using manual pressure and straightening and                            shortening the scope to obtain bowel loop                            reduction. The patient tolerated the procedure. The  quality of the bowel preparation was excellent. The                            terminal ileum, ileocecal valve, appendiceal                            orifice, and rectum were photographed. Scope In: 9:00:42 AM Scope Out: 9:20:38 AM Scope Withdrawal Time: 0 hours 9 minutes 25 seconds  Total Procedure Duration: 0 hours 19 minutes 56 seconds  Findings:      The perianal and digital rectal examinations were normal.      The terminal ileum appeared normal.      A few small-mouthed diverticula were found in the sigmoid colon.      Normal mucosa was found in the entire colon. Biopsies for histology were       taken with a cold forceps from the right colon and left colon for       evaluation of microscopic colitis.      Retroflexion in the rectum was not performed due to anatomy.      The exam was otherwise without abnormality. Impression:               - The examined portion of the ileum was normal.                           - Diverticulosis in the sigmoid  colon.                           - Normal mucosa in the entire examined colon.                            Biopsied.                           - The examination was otherwise normal.                           Overall clinical picture consistent with IBS and                            self-limited benign anal bleeding. Moderate Sedation:      MAC sedation used Recommendation:           - Patient has a contact number available for                            emergencies. The signs and symptoms of potential                            delayed complications were discussed with the                            patient. Return to normal activities tomorrow.                            Written discharge instructions were provided to the  patient.                           - Resume previous diet.                           - Continue present medications.                           - Await pathology results.                           - Return to nurse practitioner at appointment to be                            scheduled. Procedure Code(s):        --- Professional ---                           [phone], Colonoscopy, flexible; with biopsy, single                            or multiple Diagnosis Code(s):        --- Professional ---                           K52.9, Noninfective gastroenteritis and colitis,                            unspecified                           K62.5, Hemorrhage of anus and rectum                           K57.30, Diverticulosis of large intestine without                            perforation or abscess without bleeding CPT copyright 2019 American Medical Association. All rights reserved. The codes documented in this report are preliminary and upon coder review may  be revised to meet current compliance requirements. Ridge Lafond L. Loletha Carrow, MD 10/14/2021 9:28:06 AM This report has been signed electronically. Number of Addenda: 0

## 2021-10-14 NOTE — Anesthesia Postprocedure Evaluation (Signed)
Anesthesia Post Note  Patient: [name]  Procedure(s) Performed: COLONOSCOPY WITH PROPOFOL BIOPSY     Patient location during evaluation: PACU Anesthesia Type: MAC Level of consciousness: awake and alert and oriented Pain management: pain level controlled Vital Signs Assessment: post-procedure vital signs reviewed and stable Respiratory status: spontaneous breathing, nonlabored ventilation and respiratory function stable Cardiovascular status: stable and blood pressure returned to baseline Postop Assessment: no apparent nausea or vomiting Anesthetic complications: no   No notable events documented.  Last Vitals:  Vitals:   10/14/21 0930 10/14/21 0940  BP: (!) 187/102 (!) 172/107  Pulse: 93 86  Resp: 16 15  Temp: 36.7 C   SpO2: 100% 96%    Last Pain:  Vitals:   10/14/21 0930  TempSrc: Temporal  PainSc: 0-No pain                 Cay Kath A.

## 2021-10-14 NOTE — Anesthesia Preprocedure Evaluation (Addendum)
Anesthesia Evaluation  Patient identified by MRN, date of birth, ID band Patient awake    Reviewed: Allergy & Precautions, NPO status , Patient's Chart, lab work & pertinent test results, reviewed documented beta blocker date and time   Airway Mallampati: II  TM Distance: >3 FB Neck ROM: Full    Dental  (+) Poor Dentition, Dental Advisory Given   Pulmonary former smoker,    Pulmonary exam normal breath sounds clear to auscultation       Cardiovascular hypertension, Pt. on medications Normal cardiovascular exam Rhythm:Regular Rate:Normal     Neuro/Psych PSYCHIATRIC DISORDERS Depression Bipolar Disorder Bilateral hearing deficits    GI/Hepatic Neg liver ROS, GERD  Medicated and Controlled,Chronic diarrhea Rectal bleeding   Endo/Other    Renal/GU ESRF and DialysisRenal diseaseLast dialysis 6/2 & 6/3  negative genitourinary   Musculoskeletal negative musculoskeletal ROS (+)   Abdominal   Peds  Hematology  (+) Blood dyscrasia, anemia ,   Anesthesia Other Findings   Reproductive/Obstetrics                            Anesthesia Physical Anesthesia Plan  ASA: 4  Anesthesia Plan: MAC   Post-op Pain Management:    Induction: Intravenous  PONV Risk Score and Plan: 1 and Treatment may vary due to age or medical condition and Propofol infusion  Airway Management Planned: Natural Airway and Nasal Cannula  Additional Equipment: None  Intra-op Plan:   Post-operative Plan:   Informed Consent: I have reviewed the patients History and Physical, chart, labs and discussed the procedure including the risks, benefits and alternatives for the proposed anesthesia with the patient or authorized representative who has indicated his/her understanding and acceptance.     Dental advisory given  Plan Discussed with: CRNA and Anesthesiologist  Anesthesia Plan Comments:         Anesthesia Quick  Evaluation

## 2021-10-14 NOTE — Discharge Instructions (Signed)

## 2021-10-14 NOTE — Transfer of Care (Signed)
Immediate Anesthesia Transfer of Care Note  Patient: [name]  Procedure(s) Performed: COLONOSCOPY WITH PROPOFOL BIOPSY  Patient Location: PACU and Endoscopy Unit  Anesthesia Type:MAC  Level of Consciousness: awake, alert , oriented and patient cooperative  Airway & Oxygen Therapy: Patient Spontanous Breathing and Patient connected to face mask oxygen  Post-op Assessment: Report given to RN and Post -op Vital signs reviewed and stable  Post vital signs: Reviewed and stable  Last Vitals:  Vitals Value Taken Time  BP    Temp    Pulse    Resp    SpO2      Last Pain:  Vitals:   10/14/21 0810  TempSrc: Oral  PainSc: 0-No pain         Complications: No notable events documented.

## 2021-10-14 NOTE — H&P (Signed)
History and Physical:  This patient presents for endoscopic testing for: Chronic diarrhea and rectal bleeding  This is a 36 year old man on hemodialysis who was seen by our office months ago for chronic diarrhea and rectal bleeding.  He was much improved on daily use of dicyclomine.  He had been scheduled for colonoscopy with me in April but he canceled because he did not take his bowel preparation. His symptoms remain under good control with continued use of dicyclomine and he has no additional GI symptoms since last evaluated at our office.  He underwent dialysis 3 days ago and again 2 days ago and says he is feeling well today.  He denies chest pain dyspnea or abdominal pain.  Patient is otherwise without complaints or active issues today.   Past Medical History: Past Medical History:  Diagnosis Date   Bipolar 1 disorder ([place])    CKD (chronic kidney disease)    on dialysis   Depression    GERD (gastroesophageal reflux disease)    Hearing difficulty of left ear    75% hearing   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   Hypertension    Low blood sugar    Renal disorder      Past Surgical History: Past Surgical History:  Procedure Laterality Date   APPENDECTOMY     AV FISTULA PLACEMENT Left 03/03/2019   Procedure: ARTERIOVENOUS (AV) FISTULA CREATION LEFT ARM;  Surgeon: Waynetta Sandy, MD;  Location: [facility];  Service: Vascular;  Laterality: Left;   [place] Right 04/12/2019   Procedure: RIGHT UPPER EXTREMITY [place];  Surgeon: Angelia Mould, MD;  Location: [facility];  Service: Vascular;  Laterality: Right;   LIGATION OF ARTERIOVENOUS  FISTULA Left 03/06/2019   Procedure: LIGATION OF ARTERIOVENOUS  FISTULA;  Surgeon: Marty Heck, MD;  Location: [facility];  Service: Vascular;  Laterality: Left;   spinal tap     SPINE SURGERY     related to a spinal infection, unsure of surgery or infection source    WISDOM TOOTH EXTRACTION      Allergies: Allergies  Allergen Reactions   Lisinopril Swelling   Nsaids Other (See Comments)    "Kidney problems "   Other Other (See Comments)    Patient stated that he goes to Dr. Clover Mealy for his kidney problems and only takes medication that she gives him.    Risperidone     Other reaction(s): Unknown    Outpatient Meds: No current facility-administered medications for this encounter.      ___________________________________________________________________ Objective   Exam:  BP (!) 151/86   Pulse 87   Resp 14   Ht 5\' 8"  (1.727 m)   Wt 77.1 kg   SpO2 99%   BMI 25.85 kg/m   CV: RRR without murmur, S1/S2 Resp: clear to auscultation bilaterally, normal RR and effort noted GI: soft, no tenderness, with active bowel sounds. AV fistula right upper extremity with palpable thrill  Assessment: Chronic diarrhea Rectal bleeding   Plan: Colonoscopy  The benefits and risks of the planned procedure were described in detail with the patient or (when appropriate) their health care proxy.  Risks were outlined as including, but not limited to, bleeding, infection, perforation, adverse medication reaction leading to cardiac or pulmonary decompensation, pancreatitis (if ERCP).  The limitation of incomplete mucosal visualization was also discussed.  No guarantees or warranties were given.    The patient is appropriate for an endoscopic  procedure in the ambulatory setting.   - Wilfrid Lund, MD

## 2021-10-14 NOTE — Interval H&P Note (Signed)
History and Physical Interval Note:  10/14/2021 8:54 AM  [name]  has presented today for surgery, with the diagnosis of Chronic diarrhea, rectal bleeding.  The various methods of treatment have been discussed with the patient and family. After consideration of risks, benefits and other options for treatment, the patient has consented to  Procedure(s): COLONOSCOPY WITH PROPOFOL (N/A) as a surgical intervention.  The patient's history has been reviewed, patient examined, no change in status, stable for surgery.  I have reviewed the patient's chart and labs.  Questions were answered to the patient's satisfaction.    I-STAT results: Hemoglobin 10.2, potassium 3.8 Proceed with colonoscopy.  Nelida Meuse III

## 2021-10-15 ENCOUNTER — Encounter: Payer: Self-pay | Admitting: *Deleted

## 2021-10-15 ENCOUNTER — Encounter (HOSPITAL_COMMUNITY): Payer: Self-pay | Admitting: Gastroenterology

## 2021-10-15 LAB — SURGICAL PATHOLOGY

## 2021-10-16 ENCOUNTER — Encounter: Payer: Self-pay | Admitting: Gastroenterology

## 2021-11-23 ENCOUNTER — Other Ambulatory Visit: Payer: Self-pay

## 2021-11-23 ENCOUNTER — Encounter (HOSPITAL_COMMUNITY): Payer: Self-pay

## 2021-11-23 ENCOUNTER — Emergency Department (HOSPITAL_COMMUNITY)
Admission: EM | Admit: 2021-11-23 | Discharge: 2021-11-23 | Disposition: A | Discharge status: Home / Self Care | Payer: Medicare Other | Attending: Emergency Medicine | Admitting: Emergency Medicine

## 2021-11-23 ENCOUNTER — Emergency Department (HOSPITAL_COMMUNITY): Payer: Medicare Other

## 2021-11-23 DIAGNOSIS — R0989 Other specified symptoms and signs involving the circulatory and respiratory systems: Secondary | ICD-10-CM | POA: Diagnosis not present

## 2021-11-23 DIAGNOSIS — N189 Chronic kidney disease, unspecified: Secondary | ICD-10-CM | POA: Insufficient documentation

## 2021-11-23 DIAGNOSIS — D696 Thrombocytopenia, unspecified: Secondary | ICD-10-CM | POA: Diagnosis not present

## 2021-11-23 DIAGNOSIS — D649 Anemia, unspecified: Secondary | ICD-10-CM | POA: Insufficient documentation

## 2021-11-23 DIAGNOSIS — Z79899 Other long term (current) drug therapy: Secondary | ICD-10-CM | POA: Diagnosis not present

## 2021-11-23 DIAGNOSIS — Z992 Dependence on renal dialysis: Secondary | ICD-10-CM | POA: Diagnosis not present

## 2021-11-23 DIAGNOSIS — Q8781 Alport syndrome: Secondary | ICD-10-CM | POA: Insufficient documentation

## 2021-11-23 DIAGNOSIS — N186 End stage renal disease: Secondary | ICD-10-CM | POA: Insufficient documentation

## 2021-11-23 DIAGNOSIS — D631 Anemia in chronic kidney disease: Secondary | ICD-10-CM | POA: Insufficient documentation

## 2021-11-23 DIAGNOSIS — I129 Hypertensive chronic kidney disease with stage 1 through stage 4 chronic kidney disease, or unspecified chronic kidney disease: Secondary | ICD-10-CM | POA: Insufficient documentation

## 2021-11-23 DIAGNOSIS — Z5900 Homelessness unspecified: Secondary | ICD-10-CM | POA: Insufficient documentation

## 2021-11-23 DIAGNOSIS — D709 Neutropenia, unspecified: Secondary | ICD-10-CM | POA: Insufficient documentation

## 2021-11-23 DIAGNOSIS — I120 Hypertensive chronic kidney disease with stage 5 chronic kidney disease or end stage renal disease: Secondary | ICD-10-CM | POA: Diagnosis not present

## 2021-11-23 DIAGNOSIS — R079 Chest pain, unspecified: Secondary | ICD-10-CM | POA: Diagnosis not present

## 2021-11-23 LAB — CBC WITH DIFFERENTIAL/PLATELET
Abs Immature Granulocytes: 0 10*3/uL (ref 0.00–0.07)
Basophils Absolute: 0 10*3/uL (ref 0.0–0.1)
Basophils Relative: 1 %
Eosinophils Absolute: 0.2 10*3/uL (ref 0.0–0.5)
Eosinophils Relative: 5 %
HCT: 23.4 % — ABNORMAL LOW (ref 39.0–52.0)
Hemoglobin: 7.3 g/dL — ABNORMAL LOW (ref 13.0–17.0)
Immature Granulocytes: 0 %
Lymphocytes Relative: 29 %
Lymphs Abs: 1 10*3/uL (ref 0.7–4.0)
MCH: 30.7 pg (ref 26.0–34.0)
MCHC: 31.2 g/dL (ref 30.0–36.0)
MCV: 98.3 fL (ref 80.0–100.0)
Monocytes Absolute: 0.3 10*3/uL (ref 0.1–1.0)
Monocytes Relative: 8 %
Neutro Abs: 2.1 10*3/uL (ref 1.7–7.7)
Neutrophils Relative %: 57 %
Platelets: 146 10*3/uL — ABNORMAL LOW (ref 150–400)
RBC: 2.38 MIL/uL — ABNORMAL LOW (ref 4.22–5.81)
RDW: 14.2 % (ref 11.5–15.5)
WBC: 3.6 10*3/uL — ABNORMAL LOW (ref 4.0–10.5)
nRBC: 0 % (ref 0.0–0.2)

## 2021-11-23 LAB — COMPREHENSIVE METABOLIC PANEL
ALT: 28 U/L (ref 0–44)
AST: 23 U/L (ref 15–41)
Albumin: 3.5 g/dL (ref 3.5–5.0)
Alkaline Phosphatase: 90 U/L (ref 38–126)
Anion gap: 15 (ref 5–15)
BUN: 125 mg/dL — ABNORMAL HIGH (ref 6–20)
CO2: 12 mmol/L — ABNORMAL LOW (ref 22–32)
Calcium: 7.8 mg/dL — ABNORMAL LOW (ref 8.9–10.3)
Chloride: 115 mmol/L — ABNORMAL HIGH (ref 98–111)
Creatinine, Ser: 18.63 mg/dL — ABNORMAL HIGH (ref 0.61–1.24)
GFR, Estimated: 3 mL/min — ABNORMAL LOW (ref >60)
Glucose, Bld: 82 mg/dL (ref 70–99)
Potassium: 4.2 mmol/L (ref 3.5–5.1)
Sodium: 142 mmol/L (ref 135–145)
Total Bilirubin: 0.5 mg/dL (ref 0.3–1.2)
Total Protein: 6.2 g/dL — ABNORMAL LOW (ref 6.5–8.1)

## 2021-11-23 LAB — BRAIN NATRIURETIC PEPTIDE: B Natriuretic Peptide: 35.5 pg/mL (ref 0.0–100.0)

## 2021-11-23 NOTE — ED Notes (Addendum)
Unsuccessful venipuncture attempt in L AC, Upper arm. X2 Will have another staff attempt.

## 2021-11-23 NOTE — Discharge Instructions (Signed)
You are anemic on your labs today but you do not have any dangerous electrolyte abnormalities.  Please discuss your anemia with your dialysis center later this morning and return to the ER with any severe symptoms.

## 2021-11-23 NOTE — ED Provider Notes (Signed)
[place] DEPT Provider Note   CSN: [id number] Arrival date & time: 11/23/21  0302     History  Chief Complaint  Patient presents with   lab work    [name] is a 36 y.o. 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  Patient states that he makes urine throughout the day.  States he only came for laboratory studies as this was required in the emergency department prior to his dialysis session this morning at 6 AM which will be his first in a month.  He has no symptoms at this time.  I personally reviewed medical records.  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  Also has history of depression.  Does not report taking any medications daily aside from his amlodipine and calcitriol.  HPI     Home Medications Prior to Admission medications   Medication Sig Start Date End Date Taking? Authorizing Provider  acetaminophen (TYLENOL) 500 MG tablet Take 500 mg by mouth every 6 (six) hours as needed for mild pain or moderate pain.   Yes [provider]  amLODipine (NORVASC) 10 MG tablet Take 1 tablet (10 mg total) by mouth daily. 02/11/21  Yes Hensel, Jamal Collin, MD  calcitRIOL (ROCALTROL) 0.25 MCG capsule Take 0.25 mcg by mouth daily.   Yes [provider]  calcium elemental as carbonate (TUMS ULTRA 1000) 400 MG chewable tablet Chew 1,000 mg by mouth daily as needed for heartburn.   Yes [provider]  dicyclomine (BENTYL) 10 MG capsule TAKE 1 TABLET 2-3 TIMES A DAY AS NEEDED FOR ABDOMINAL CRAMPING AND/OR DIARRHEA. Patient taking differently: Take 10 mg by mouth 3 (three) times daily as needed for spasms. 10/08/21  Yes Esterwood, Amy S, PA-C  pantoprazole (PROTONIX) 40 MG tablet Take 1 tablet (40 mg total) by  mouth daily. Patient taking differently: Take 40 mg by mouth daily as needed. 06/12/21  Yes Lattie Haw, MD  metoCLOPramide (REGLAN) 5 MG tablet Take 1 tablet (5 mg total) by mouth every 12 (twelve) hours as needed for up to 2 doses for nausea. Take 30-45 minutes before evening and AM doses of bowel preparation solution. Patient not taking: Reported on 11/23/2021 08/22/21   Doran Stabler, MD  ondansetron (ZOFRAN) 4 MG tablet Take 1 tablet (4 mg total) by mouth every 8 (eight) hours as needed for nausea or vomiting. Patient not taking: Reported on 11/23/2021 06/25/21   Doran Stabler, MD      Allergies    Lisinopril, Nsaids, Other, and Risperidone    Review of Systems   Review of Systems  All other systems reviewed and are negative.   Physical Exam Updated Vital Signs BP (!) 146/95   Pulse 74   Temp 98.1 F (36.7 C) (Oral)   Resp 18   Ht 5\' 7"  (1.702 m)   Wt 77.1 kg   SpO2 100%   BMI 26.63 kg/m  Physical Exam Vitals and nursing note reviewed.  Constitutional:      Appearance: He is not ill-appearing or toxic-appearing.  HENT:     Head: Normocephalic and atraumatic.     Mouth/Throat:     Mouth: Mucous membranes are moist.     Pharynx: No oropharyngeal exudate or posterior oropharyngeal erythema.  Eyes:  General:        Right eye: No discharge.        Left eye: No discharge.     Conjunctiva/sclera: Conjunctivae normal.  Cardiovascular:     Rate and Rhythm: Normal rate and regular rhythm.     Pulses: Normal pulses.     Heart sounds: Normal heart sounds. No murmur heard. Pulmonary:     Effort: Pulmonary effort is normal. No respiratory distress.     Breath sounds: Normal breath sounds. No wheezing or rales.  Abdominal:     General: Bowel sounds are normal. There is no distension.     Palpations: Abdomen is soft.     Tenderness: There is no abdominal tenderness. There is no guarding.  Musculoskeletal:        General: No deformity.     Cervical back: Neck  supple.     Right lower leg: No edema.     Left lower leg: No edema.  Skin:    General: Skin is warm and dry.     Capillary Refill: Capillary refill takes less than 2 seconds.  Neurological:     General: No focal deficit present.     Mental Status: [name] and oriented to person, place, and time. Mental status is at baseline.  Psychiatric:        Mood and Affect: Mood normal.     ED Results / Procedures / Treatments   Labs (all labs ordered are listed, but only abnormal results are displayed) Labs Reviewed  CBC WITH DIFFERENTIAL/PLATELET - Abnormal; Notable for the following components:      Result Value   WBC 3.6 (*)    RBC 2.38 (*)    Hemoglobin 7.3 (*)    HCT 23.4 (*)    Platelets 146 (*)    All other components within normal limits  COMPREHENSIVE METABOLIC PANEL - Abnormal; Notable for the following components:   Chloride 115 (*)    CO2 12 (*)    BUN 125 (*)    Creatinine, Ser 18.63 (*)    Calcium 7.8 (*)    Total Protein 6.2 (*)    GFR, Estimated 3 (*)    All other components within normal limits  BRAIN NATRIURETIC PEPTIDE    EKG EKG Interpretation  Date/Time:  Saturday November 23 2021 03:45:51 EDT Ventricular Rate:  77 PR Interval:  162 QRS Duration: 87 QT Interval:  409 QTC Calculation: 463 R Axis:   59 Text Interpretation: Sinus rhythm RSR' in V1 or V2, probably normal variant LVH by voltage No significant change was found Confirmed by Shanon Rosser [phone]) on 11/23/2021 4:25:59 AM  Radiology DG Chest Portable 1 View  Result Date: 11/23/2021 CLINICAL DATA:  36 year old male with chest pain.  Dialysis patient. EXAM: PORTABLE CHEST 1 VIEW COMPARISON:  Portable chest 06/07/2021 and earlier. FINDINGS: Portable AP semi upright view at 0401 hours. Low lung volumes similar to prior. Normal cardiac size and mediastinal contours. Visualized tracheal air column is within normal limits. Mild pulmonary vascular congestion without overt edema. No pneumothorax, pleural  effusion, or confluent pulmonary opacity. Negative visible bowel gas and osseous structures. IMPRESSION: Low lung volumes with pulmonary vascular congestion, but no overt edema. Electronically Signed   By: Genevie Ann M.D.   On: 11/23/2021 04:54    Procedures Procedures    Medications Ordered in ED Medications - No data to display  ED Course/ Medical Decision Making/ A&P Clinical Course as of 11/23/21 0708  Sat Nov 23, 2021  0706 nRBC: 0.0 [RS]    Clinical Course User Index [RS] Frenchie Pribyl, Gypsy Balsam, PA-C                           Medical Decision Making 36 year old male presents with request for laboratory studies prior to resuming dialysis today.  Mildly tachycardic and hypertensive on intake, vital signs otherwise normal.  Cardiopulmonary abdominal symptoms benign.  Patient is neurovascular intact in extremities.   Amount and/or Complexity of Data Reviewed Labs: ordered.    Details: CBC with neutropenia of 3.6 and anemia with decrease in hemoglobin 7.3 from patient's baseline closer to 10.  Patient is asymptomatic with his anemia.  Thrombocytopenia of 146.  BNP is normal, CMP with creatinine of 18 near patient's baseline. Radiology: ordered.   Vital signs are reassuring at this time, patient is not in any acute distress,, would like to be discharged at this time so he can make it to his dialysis session this morning.  Given his hemodynamic stability and lack of EKG changes or significant metabolic derangement on labs do feel it is reasonable for him to be evaluated and transfused at dialysis if necessary.  Return precautions are given.  [name]  voiced understanding of her medical evaluation and treatment plan. Each of their questions answered to their expressed satisfaction.  Return precautions were given.  Patient is well-appearing, stable, and was discharged in good condition.  This chart was dictated using voice recognition software, Dragon. Despite the best efforts of this  provider to proofread and correct errors, errors may still occur which can change documentation meaning.   Final Clinical Impression(s) / ED Diagnoses Final diagnoses:  Anemia due to chronic kidney disease, on chronic dialysis [facility])    Rx / DC Orders ED Discharge Orders     None         Aura Dials 11/23/21 0708    Molpus, Jenny Reichmann, MD 11/23/21 [phone]

## 2021-11-23 NOTE — ED Notes (Signed)
Provided patient with lab results per dialysis clinic request

## 2021-11-23 NOTE — ED Triage Notes (Signed)
Pt states that he needs labs for dialysis this morning at 0600.

## 2021-12-10 ENCOUNTER — Encounter: Payer: Self-pay | Admitting: Physician Assistant

## 2021-12-10 ENCOUNTER — Other Ambulatory Visit: Payer: Self-pay | Admitting: Family Medicine

## 2021-12-10 ENCOUNTER — Ambulatory Visit (INDEPENDENT_AMBULATORY_CARE_PROVIDER_SITE_OTHER): Payer: Medicare Other | Admitting: Physician Assistant

## 2021-12-10 ENCOUNTER — Other Ambulatory Visit: Payer: Medicare Other

## 2021-12-10 VITALS — BP 142/82 | HR 75 | Ht 67.0 in | Wt 165.0 lb

## 2021-12-10 DIAGNOSIS — R197 Diarrhea, unspecified: Secondary | ICD-10-CM

## 2021-12-10 MED ORDER — DICYCLOMINE HCL 10 MG PO CAPS: 10.0000 mg | ORAL_CAPSULE | Freq: Three times a day (TID) | ORAL | 5 refills | Status: DC

## 2021-12-10 NOTE — Progress Notes (Signed)
____________________________________________________________  Attending physician addendum:  Thank you for sending this case to me. I have reviewed the entire note and agree with the plan.   Othar Curto Danis, MD  ____________________________________________________________  

## 2021-12-10 NOTE — Patient Instructions (Signed)
If you are age 34 or older, your body mass index should be between 23-30. Your Body mass index is 25.84 kg/m. If this is out of the aforementioned range listed, please consider follow up with your Primary Care Provider.  If you are age 38 or younger, your body mass index should be between 19-25. Your Body mass index is 25.84 kg/m. If this is out of the aformentioned range listed, please consider follow up with your Primary Care Provider.   We have refilled your Dicyclomine 10 mg three times daily.   Your provider has requested that you go to the basement level for lab work before leaving today. Press "B" on the elevator. The lab is located at the first door on the left as you exit the elevator.  The [place] GI providers would like to encourage you to use [facility] to communicate with providers for non-urgent requests or questions.  Due to long hold times on the telephone, sending your provider a message by [facility] may be a faster and more efficient way to get a response.  Please allow 48 business hours for a response.  Please remember that this is for non-urgent requests.   It was a pleasure to see you today!  Thank you for trusting me with your gastrointestinal care!    Ellouise Newer, PA-C

## 2021-12-10 NOTE — Progress Notes (Signed)
Chief Complaint: Follow-up chronic diarrhea and rectal bleeding  HPI:    [name] is a 36 year old African-American male with past medical history as listed below including ESRD on dialysis, GERD and multiple others, known to Dr. Loletha Carrow, who presents to clinic today for follow-up of his chronic diarrhea and rectal bleeding.    06/25/2021 patient seen in consult by Dr. Loletha Carrow for chronic diarrhea and rectal bleeding.  That time discussed that as long as he took his Dicyclomine 3 times daily he was doing well.    10/14/2021 colonoscopy with diverticulosis in the sigmoid colon and otherwise normal mucosa.  Discussed that overall clinical picture was consistent with IBS and self-limited benign anal bleeding.  Biopsies showed fragments of large bowel mucosa with no significant inflammation.    Today, the patient presents to clinic and tells me that he was supposed to have a follow-up after his colonoscopy but no one ever called for one.  Since being seen he has run out of his Dicyclomine which he was using 10 mg 3 times daily and has very frequent bowel movements, all loose sometimes 6+ times a day.  Often occurs immediately after eating.  Sometimes he tells me he will also "leak mucus" at night while he is sleeping.  Denies any further rectal bleeding.  Does think he has lost some weight since being here last as he is afraid to eat things because they just "run through me".    Denies fever, chills, abdominal pain, nausea or vomiting.  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   Hypertension    Low blood sugar    Renal disorder     Past Surgical History:  Procedure Laterality Date   APPENDECTOMY     AV FISTULA PLACEMENT Left 03/03/2019   Procedure: ARTERIOVENOUS (AV) FISTULA  CREATION LEFT ARM;  Surgeon: Waynetta Sandy, MD;  Location: [place];  Service: Vascular;  Laterality: Left;   [place] Right 04/12/2019   Procedure: RIGHT UPPER EXTREMITY [place];  Surgeon: Angelia Mould, MD;  Location: [place];  Service: Vascular;  Laterality: Right;   BIOPSY  10/14/2021   Procedure: BIOPSY;  Surgeon: Doran Stabler, MD;  Location: [facility];  Service: Gastroenterology;;   COLONOSCOPY WITH PROPOFOL N/A 10/14/2021   Procedure: COLONOSCOPY WITH PROPOFOL;  Surgeon: Doran Stabler, MD;  Location: [facility];  Service: Gastroenterology;  Laterality: N/A;   LIGATION OF ARTERIOVENOUS  FISTULA Left 03/06/2019   Procedure: LIGATION OF ARTERIOVENOUS  FISTULA;  Surgeon: Marty Heck, MD;  Location: [place];  Service: Vascular;  Laterality: Left;   spinal tap     SPINE SURGERY     related to a spinal infection, unsure of surgery or infection source   WISDOM TOOTH EXTRACTION      Current Outpatient Medications  Medication Sig Dispense Refill   acetaminophen (TYLENOL) 500 MG tablet Take 500 mg by mouth every 6 (six) hours as needed for mild pain or moderate pain.     amLODipine (NORVASC) 10 MG tablet Take 1 tablet (10 mg total) by mouth daily. 90 tablet 3   calcitRIOL (ROCALTROL) 0.25 MCG capsule Take 0.25  mcg by mouth daily.     calcium elemental as carbonate (TUMS ULTRA 1000) 400 MG chewable tablet Chew 1,000 mg by mouth daily as needed for heartburn.     dicyclomine (BENTYL) 10 MG capsule TAKE 1 TABLET 2-3 TIMES A DAY AS NEEDED FOR ABDOMINAL CRAMPING AND/OR DIARRHEA. (Patient taking differently: Take 10 mg by mouth 3 (three) times daily as needed for spasms.) 60 capsule 1   metoCLOPramide (REGLAN) 5 MG tablet Take 1 tablet (5 mg total) by mouth every 12 (twelve) hours as needed for up to 2 doses for nausea. Take 30-45 minutes before evening and AM doses of bowel preparation solution. (Patient not  taking: Reported on 11/23/2021) 2 tablet 0   ondansetron (ZOFRAN) 4 MG tablet Take 1 tablet (4 mg total) by mouth every 8 (eight) hours as needed for nausea or vomiting. (Patient not taking: Reported on 11/23/2021) 2 tablet 0   pantoprazole (PROTONIX) 40 MG tablet Take 1 tablet (40 mg total) by mouth daily. (Patient taking differently: Take 40 mg by mouth daily as needed.) 30 tablet 3   No current facility-administered medications for this visit.    Allergies as of 12/10/2021 - Review Complete 11/23/2021  Allergen Reaction Noted   Lisinopril Swelling 06/13/2021   Nsaids Other (See Comments) 05/20/2014   Other Other (See Comments) 09/01/2013   Risperidone  06/13/2021    Family History  Problem Relation Age of Onset   Hypertension Mother    Kidney failure Mother    Diabetes Mother    Hearing loss Father    Hypertension Sister    Heart disease Maternal Grandmother    Stroke Maternal Grandfather    Asthma Son     Social History   Socioeconomic History   Marital status: Married    Spouse name: Not on file   Number of children: 3   Years of education: Not on file   Highest education level: Not on file  Occupational History   Occupation: HVAC  Tobacco Use   Smoking status: Former    Packs/day: 0.30    Types: Cigarettes    Quit date: 02/27/2016    Years since quitting: 5.7   Smokeless tobacco: Never  Vaping Use   Vaping Use: Never used  Substance and Sexual Activity   Alcohol use: Not Currently    Comment: rare   Drug use: Yes    Types: Marijuana   Sexual activity: Not Currently  Other Topics Concern   Not on file  Social History Narrative   Not on file   Social Determinants of Health   Financial Resource Strain: Not on file  Food Insecurity: Not on file  Transportation Needs: Not on file  Physical Activity: Not on file  Stress: Not on file  Social Connections: Not on file  Intimate Partner Violence: Not on file    Review of Systems:    Constitutional: No  weight loss, fever or chills Cardiovascular: No chest pain  Respiratory: No SOB Gastrointestinal: See HPI and otherwise negative   Physical Exam:  Vital signs: BP (!) 142/82   Pulse 75   Ht 5\' 7"  (1.702 m)   Wt 165 lb (74.8 kg)   BMI 25.84 kg/m    Constitutional:   Pleasant AA male appears to be in NAD, Well developed, Well nourished, alert and cooperative Respiratory: Respirations even and unlabored. Lungs clear to auscultation bilaterally.   No wheezes, crackles, or rhonchi.  Cardiovascular: Normal S1, S2. No MRG. Regular rate and rhythm. No peripheral  edema, cyanosis or pallor.  Gastrointestinal:  Soft, nondistended, nontender. No rebound or guarding. Normal bowel sounds. No appreciable masses or hepatomegaly. Psychiatric: Demonstrates good judgement and reason without abnormal affect or behaviors.  RELEVANT LABS AND IMAGING: CBC    Component Value Date/Time   WBC 3.6 (L) 11/23/2021 0340   RBC 2.38 (L) 11/23/2021 0340   HGB 7.3 (L) 11/23/2021 0340   HGB 12.0 (L) 06/12/2021 1458   HCT 23.4 (L) 11/23/2021 0340   HCT 35.9 (L) 06/12/2021 1458   PLT 146 (L) 11/23/2021 0340   PLT 224 06/12/2021 1458   MCV 98.3 11/23/2021 0340   MCV 94 06/12/2021 1458   MCH 30.7 11/23/2021 0340   MCHC 31.2 11/23/2021 0340   RDW 14.2 11/23/2021 0340   RDW 13.7 06/12/2021 1458   LYMPHSABS 1.0 11/23/2021 0340   MONOABS 0.3 11/23/2021 0340   EOSABS 0.2 11/23/2021 0340   BASOSABS 0.0 11/23/2021 0340    CMP     Component Value Date/Time   NA 142 11/23/2021 0340   NA 140 06/12/2021 1458   K 4.2 11/23/2021 0340   CL 115 (H) 11/23/2021 0340   CO2 12 (L) 11/23/2021 0340   GLUCOSE 82 11/23/2021 0340   BUN 125 (H) 11/23/2021 0340   BUN 32 (H) 06/12/2021 1458   CREATININE 18.63 (H) 11/23/2021 0340   CALCIUM 7.8 (L) 11/23/2021 0340   PROT 6.2 (L) 11/23/2021 0340   PROT 7.4 06/12/2021 1458   ALBUMIN 3.5 11/23/2021 0340   ALBUMIN 5.0 06/12/2021 1458   AST 23 11/23/2021 0340   ALT 28  11/23/2021 0340   ALKPHOS 90 11/23/2021 0340   BILITOT 0.5 11/23/2021 0340   BILITOT 0.6 06/12/2021 1458   GFRNONAA 3 (L) 11/23/2021 0340   GFRAA 6 (L) 10/26/2019 1032    Assessment: 1.  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  Refilled Dicyclomine 10 mg 3 times daily for now.  Discussed we can increase dosage if this is not helpful.  Prescribed #90 with 3 refills. 2.  Ordered stool studies given increased frequency and no solid stools recently to include a GI pathogen panel, O&P and fecal pancreatic elastase 3.  Patient to follow in clinic per recommendations after results from stool studies.  Ellouise Newer, PA-C [facility] 12/10/2021, 3:39 PM  Cc: [name], DO

## 2021-12-25 ENCOUNTER — Other Ambulatory Visit: Payer: Self-pay | Admitting: Family Medicine

## 2022-01-01 ENCOUNTER — Emergency Department (HOSPITAL_COMMUNITY): Payer: Medicare Other

## 2022-01-01 ENCOUNTER — Encounter (HOSPITAL_COMMUNITY): Payer: Self-pay | Admitting: Student

## 2022-01-01 ENCOUNTER — Inpatient Hospital Stay (HOSPITAL_COMMUNITY)
Admission: EM | Admit: 2022-01-01 | Discharge: 2022-01-04 | DRG: 640 | Disposition: A | Discharge status: Home / Self Care | Payer: Medicare Other | Attending: Family Medicine | Admitting: Family Medicine

## 2022-01-01 DIAGNOSIS — Z597 Insufficient social insurance and welfare support: Secondary | ICD-10-CM

## 2022-01-01 DIAGNOSIS — N186 End stage renal disease: Secondary | ICD-10-CM | POA: Diagnosis present

## 2022-01-01 DIAGNOSIS — Z630 Problems in relationship with spouse or partner: Secondary | ICD-10-CM

## 2022-01-01 DIAGNOSIS — Z992 Dependence on renal dialysis: Secondary | ICD-10-CM

## 2022-01-01 DIAGNOSIS — K921 Melena: Secondary | ICD-10-CM | POA: Diagnosis present

## 2022-01-01 DIAGNOSIS — Z59819 Housing instability, housed unspecified: Secondary | ICD-10-CM

## 2022-01-01 DIAGNOSIS — Z5941 Food insecurity: Secondary | ICD-10-CM

## 2022-01-01 DIAGNOSIS — K5730 Diverticulosis of large intestine without perforation or abscess without bleeding: Secondary | ICD-10-CM | POA: Diagnosis present

## 2022-01-01 DIAGNOSIS — Z91199 Patient's noncompliance with other medical treatment and regimen due to unspecified reason: Secondary | ICD-10-CM

## 2022-01-01 DIAGNOSIS — M79662 Pain in left lower leg: Secondary | ICD-10-CM

## 2022-01-01 DIAGNOSIS — I10 Essential (primary) hypertension: Secondary | ICD-10-CM | POA: Diagnosis present

## 2022-01-01 DIAGNOSIS — F319 Bipolar disorder, unspecified: Secondary | ICD-10-CM | POA: Diagnosis present

## 2022-01-01 DIAGNOSIS — H9190 Unspecified hearing loss, unspecified ear: Secondary | ICD-10-CM

## 2022-01-01 DIAGNOSIS — R0602 Shortness of breath: Secondary | ICD-10-CM | POA: Diagnosis not present

## 2022-01-01 DIAGNOSIS — Z8249 Family history of ischemic heart disease and other diseases of the circulatory system: Secondary | ICD-10-CM

## 2022-01-01 DIAGNOSIS — E8770 Fluid overload, unspecified: Secondary | ICD-10-CM | POA: Diagnosis not present

## 2022-01-01 DIAGNOSIS — Z841 Family history of disorders of kidney and ureter: Secondary | ICD-10-CM

## 2022-01-01 DIAGNOSIS — Z825 Family history of asthma and other chronic lower respiratory diseases: Secondary | ICD-10-CM

## 2022-01-01 DIAGNOSIS — Z833 Family history of diabetes mellitus: Secondary | ICD-10-CM

## 2022-01-01 DIAGNOSIS — Q8781 Alport syndrome: Secondary | ICD-10-CM

## 2022-01-01 DIAGNOSIS — Z91158 Patient's noncompliance with renal dialysis for other reason: Secondary | ICD-10-CM | POA: Insufficient documentation

## 2022-01-01 DIAGNOSIS — M79605 Pain in left leg: Secondary | ICD-10-CM

## 2022-01-01 DIAGNOSIS — M79604 Pain in right leg: Secondary | ICD-10-CM

## 2022-01-01 DIAGNOSIS — R197 Diarrhea, unspecified: Secondary | ICD-10-CM | POA: Diagnosis not present

## 2022-01-01 DIAGNOSIS — Z823 Family history of stroke: Secondary | ICD-10-CM

## 2022-01-01 DIAGNOSIS — Z87891 Personal history of nicotine dependence: Secondary | ICD-10-CM

## 2022-01-01 DIAGNOSIS — D631 Anemia in chronic kidney disease: Secondary | ICD-10-CM | POA: Diagnosis present

## 2022-01-01 DIAGNOSIS — I120 Hypertensive chronic kidney disease with stage 5 chronic kidney disease or end stage renal disease: Secondary | ICD-10-CM | POA: Diagnosis present

## 2022-01-01 DIAGNOSIS — K922 Gastrointestinal hemorrhage, unspecified: Secondary | ICD-10-CM | POA: Diagnosis present

## 2022-01-01 DIAGNOSIS — Z886 Allergy status to analgesic agent status: Secondary | ICD-10-CM

## 2022-01-01 DIAGNOSIS — M79661 Pain in right lower leg: Secondary | ICD-10-CM

## 2022-01-01 DIAGNOSIS — Z5900 Homelessness unspecified: Secondary | ICD-10-CM

## 2022-01-01 DIAGNOSIS — N2581 Secondary hyperparathyroidism of renal origin: Secondary | ICD-10-CM | POA: Diagnosis present

## 2022-01-01 DIAGNOSIS — Z560 Unemployment, unspecified: Secondary | ICD-10-CM

## 2022-01-01 DIAGNOSIS — Z888 Allergy status to other drugs, medicaments and biological substances status: Secondary | ICD-10-CM

## 2022-01-01 DIAGNOSIS — K219 Gastro-esophageal reflux disease without esophagitis: Secondary | ICD-10-CM | POA: Diagnosis present

## 2022-01-01 LAB — CBC WITH DIFFERENTIAL/PLATELET
Abs Immature Granulocytes: 0.01 10*3/uL (ref 0.00–0.07)
Basophils Absolute: 0 10*3/uL (ref 0.0–0.1)
Basophils Relative: 0 %
Eosinophils Absolute: 0.3 10*3/uL (ref 0.0–0.5)
Eosinophils Relative: 7 %
HCT: 27.1 % — ABNORMAL LOW (ref 39.0–52.0)
Hemoglobin: 8.7 g/dL — ABNORMAL LOW (ref 13.0–17.0)
Immature Granulocytes: 0 %
Lymphocytes Relative: 28 %
Lymphs Abs: 1.3 10*3/uL (ref 0.7–4.0)
MCH: 30.3 pg (ref 26.0–34.0)
MCHC: 32.1 g/dL (ref 30.0–36.0)
MCV: 94.4 fL (ref 80.0–100.0)
Monocytes Absolute: 0.3 10*3/uL (ref 0.1–1.0)
Monocytes Relative: 7 %
Neutro Abs: 2.7 10*3/uL (ref 1.7–7.7)
Neutrophils Relative %: 58 %
Platelets: 158 10*3/uL (ref 150–400)
RBC: 2.87 MIL/uL — ABNORMAL LOW (ref 4.22–5.81)
RDW: 13.8 % (ref 11.5–15.5)
WBC: 4.6 10*3/uL (ref 4.0–10.5)
nRBC: 0 % (ref 0.0–0.2)

## 2022-01-01 LAB — COMPREHENSIVE METABOLIC PANEL
ALT: 43 U/L (ref 0–44)
AST: 19 U/L (ref 15–41)
Albumin: 3.9 g/dL (ref 3.5–5.0)
Alkaline Phosphatase: 76 U/L (ref 38–126)
Anion gap: 14 (ref 5–15)
BUN: 134 mg/dL — ABNORMAL HIGH (ref 6–20)
CO2: 11 mmol/L — ABNORMAL LOW (ref 22–32)
Calcium: 8.7 mg/dL — ABNORMAL LOW (ref 8.9–10.3)
Chloride: 112 mmol/L — ABNORMAL HIGH (ref 98–111)
Creatinine, Ser: 21.18 mg/dL — ABNORMAL HIGH (ref 0.61–1.24)
GFR, Estimated: 3 mL/min — ABNORMAL LOW (ref >60)
Glucose, Bld: 90 mg/dL (ref 70–99)
Potassium: 5 mmol/L (ref 3.5–5.1)
Sodium: 137 mmol/L (ref 135–145)
Total Bilirubin: 0.4 mg/dL (ref 0.3–1.2)
Total Protein: 6.3 g/dL — ABNORMAL LOW (ref 6.5–8.1)

## 2022-01-01 LAB — HEPATITIS B SURFACE ANTIBODY,QUALITATIVE: Hep B S Ab: REACTIVE — AB

## 2022-01-01 LAB — HEPATITIS C ANTIBODY: HCV Ab: NONREACTIVE

## 2022-01-01 LAB — HIV ANTIBODY (ROUTINE TESTING W REFLEX): HIV Screen 4th Generation wRfx: NONREACTIVE

## 2022-01-01 LAB — PHOSPHORUS: Phosphorus: 8.9 mg/dL — ABNORMAL HIGH (ref 2.5–4.6)

## 2022-01-01 LAB — HEPATITIS B SURFACE ANTIGEN: Hepatitis B Surface Ag: NONREACTIVE

## 2022-01-01 LAB — HEPATITIS B CORE ANTIBODY, TOTAL: Hep B Core Total Ab: NONREACTIVE

## 2022-01-01 MED ORDER — HEPARIN SODIUM (PORCINE) 1000 UNIT/ML IJ SOLN
INTRAMUSCULAR | Status: AC
  Filled 2022-01-01: qty 2

## 2022-01-01 MED ORDER — HEPARIN SODIUM (PORCINE) 1000 UNIT/ML DIALYSIS
1200.0000 [IU] | INTRAMUSCULAR | Status: DC | PRN
  Administered 2022-01-01: 2000 [IU] via INTRAVENOUS_CENTRAL
  Filled 2022-01-01: qty 2

## 2022-01-01 MED ORDER — CALCITRIOL 0.25 MCG PO CAPS
0.2500 ug | ORAL_CAPSULE | Freq: Every day | ORAL | Status: DC
  Administered 2022-01-01 – 2022-01-04 (×4): 0.25 ug via ORAL
  Filled 2022-01-01 (×5): qty 1

## 2022-01-01 MED ORDER — DARBEPOETIN ALFA 150 MCG/0.3ML IJ SOSY
150.0000 ug | PREFILLED_SYRINGE | Freq: Once | INTRAMUSCULAR | Status: AC
  Administered 2022-01-01: 150 ug via INTRAVENOUS
  Filled 2022-01-01: qty 0.3

## 2022-01-01 MED ORDER — DARBEPOETIN ALFA 150 MCG/0.3ML IJ SOSY: 150.0000 ug | PREFILLED_SYRINGE | INTRAMUSCULAR | Status: DC

## 2022-01-01 MED ORDER — ACETAMINOPHEN 325 MG PO TABS
650.0000 mg | ORAL_TABLET | Freq: Four times a day (QID) | ORAL | Status: DC | PRN
  Administered 2022-01-01 – 2022-01-04 (×4): 650 mg via ORAL
  Filled 2022-01-01 (×4): qty 2

## 2022-01-01 MED ORDER — HEPARIN SODIUM (PORCINE) 1000 UNIT/ML DIALYSIS
1200.0000 [IU] | INTRAMUSCULAR | Status: DC | PRN
Start: 2022-01-02 — End: 2022-01-02

## 2022-01-01 MED ORDER — CHLORHEXIDINE GLUCONATE CLOTH 2 % EX PADS
6.0000 | MEDICATED_PAD | Freq: Every day | CUTANEOUS | Status: DC
  Administered 2022-01-03 – 2022-01-04 (×2): 6 via TOPICAL

## 2022-01-01 MED ORDER — CHLORHEXIDINE GLUCONATE CLOTH 2 % EX PADS: 6.0000 | MEDICATED_PAD | Freq: Every day | CUTANEOUS | Status: DC

## 2022-01-01 MED ORDER — AMLODIPINE BESYLATE 10 MG PO TABS
10.0000 mg | ORAL_TABLET | Freq: Every day | ORAL | Status: DC
  Administered 2022-01-01 – 2022-01-04 (×4): 10 mg via ORAL
  Filled 2022-01-01 (×4): qty 1

## 2022-01-01 MED ORDER — HEPARIN SODIUM (PORCINE) 5000 UNIT/ML IJ SOLN
5000.0000 [IU] | Freq: Three times a day (TID) | INTRAMUSCULAR | Status: DC
  Administered 2022-01-02 – 2022-01-04 (×7): 5000 [IU] via SUBCUTANEOUS
  Filled 2022-01-01 (×6): qty 1

## 2022-01-01 MED ORDER — ACETAMINOPHEN 500 MG PO TABS
500.0000 mg | ORAL_TABLET | Freq: Four times a day (QID) | ORAL | Status: DC | PRN
Start: 2022-01-01 — End: 2022-01-01

## 2022-01-01 NOTE — Assessment & Plan Note (Addendum)
Patient reports multiple episodes of bloody diarrhea that he is unable to control.  Reports has been ongoing for 3 to 4 months.  He saw GI previously and they did a colonoscopy that showed a few small mouth diverticula in the sigmoid and benign rectal bleeding, otherwise normal. Symptoms thought to be related to IBS. Patient's symptoms were well controlled on 10 mg dicyclomine TID.  - restart 10 mg dicyclomine TID for IBS-D - Hemoglobin 8.7 in the ED; also likely in the setting of ESRD - Transfuse if less than 7 - Consider GI consult if worsening anemia

## 2022-01-01 NOTE — Assessment & Plan Note (Signed)
Patient reports history of belching and hiccups. - Start 40 mg p.o. Protonix

## 2022-01-01 NOTE — Consult Note (Signed)
Renal Service Consult Note [facility]  [name] 01/01/2022 Edward Blazing, MD Requesting Physician: Dr. Ashok Cordia  Reason for Consult: ESRD pt w/ missed HD HPI: The patient is a 36 y.o. year-old w/ hx of HTN, bipolar, ESRD on HD, Alport's syndrome, HOH, HTN who presented to ED saying he has missed dialysis and doesn't feel good asking for labs to be drawn. In ED BP 170/ 90, HR 81, RR 14, afeb. BUN 134, Creat 21, CO2 11. HB 8.7. CXR clear, on room air. Asked to see for dialysis.   Pt seen in ED room. Has been on HD for 2 yrs and very good compliance. Has been having marital problems since losing his job which happened when it started dialysis. He was "kicked out" of the house by his spouse 1-2 weeks ago and has been homeless sleeping in his car.  He usually drives himself to HD but now has a flat tire and no $ to fix it, has tried calling all his friends who are not responding. Has contacted the police about getting him back in the house but nothing has happened yet, he and his spouse are on good terms her says. He has medicare and medicaid but has never need public transportation for HD.   He denies any serious SOB, cough, CP, abd pain or N/V/D. Is hungry.    ROS - denies CP, no joint pain, no HA, no blurry vision, no rash, no diarrhea, no nausea/ vomiting, no dysuria, no difficulty voiding   Past Medical History  Past Medical History:  Diagnosis Date   Bipolar 1 disorder ([place])    CKD (chronic kidney disease)    on dialysis   Depression    GERD (gastroesophageal reflux disease)    Hearing difficulty of left ear    75% hearing   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   Hypertension    Low blood sugar    Renal disorder    Past Surgical History  Past Surgical History:  Procedure Laterality Date   APPENDECTOMY     AV FISTULA PLACEMENT Left 03/03/2019   Procedure: ARTERIOVENOUS (AV) FISTULA CREATION LEFT ARM;  Surgeon: Waynetta Sandy, MD;   Location: [place];  Service: Vascular;  Laterality: Left;   [place] Right 04/12/2019   Procedure: RIGHT UPPER EXTREMITY [place];  Surgeon: Angelia Mould, MD;  Location: [place];  Service: Vascular;  Laterality: Right;   BIOPSY  10/14/2021   Procedure: BIOPSY;  Surgeon: Doran Stabler, MD;  Location: [facility];  Service: Gastroenterology;;   COLONOSCOPY WITH PROPOFOL N/A 10/14/2021   Procedure: COLONOSCOPY WITH PROPOFOL;  Surgeon: Doran Stabler, MD;  Location: [facility];  Service: Gastroenterology;  Laterality: N/A;   LIGATION OF ARTERIOVENOUS  FISTULA Left 03/06/2019   Procedure: LIGATION OF ARTERIOVENOUS  FISTULA;  Surgeon: Marty Heck, MD;  Location: [facility];  Service: Vascular;  Laterality: Left;   spinal tap     SPINE SURGERY     related to a spinal infection, unsure of surgery or infection source   WISDOM TOOTH EXTRACTION     Family History  Family History  Problem Relation Age of Onset   Hypertension Mother    Kidney failure Mother    Diabetes Mother    Hearing loss Father    Hypertension Sister    Heart disease Maternal Grandmother    Stroke Maternal Grandfather    Asthma  Son    Social History  reports that he quit smoking about 5 years ago. His smoking use included cigarettes. He smoked an average of .3 packs per day. He has never used smokeless tobacco. He reports that he does not currently use alcohol. He reports current drug use. Drug: Marijuana. Allergies  Allergies  Allergen Reactions   Lisinopril Swelling   Nsaids Other (See Comments)    "Kidney problems "   Other Other (See Comments)    Patient stated that he goes to Dr. Clover Mealy for his kidney problems and only takes medication that she gives him.    Risperidone     Other reaction(s): Unknown   Home medications Prior to Admission medications   Medication Sig Start Date End Date Taking? Authorizing Provider  acetaminophen  (TYLENOL) 500 MG tablet Take 500 mg by mouth every 6 (six) hours as needed for mild pain or moderate pain. Patient not taking: Reported on 12/10/2021    [provider]  amLODipine (NORVASC) 10 MG tablet Take 1 tablet (10 mg total) by mouth daily. 02/11/21   Zenia Resides, MD  calcitRIOL (ROCALTROL) 0.25 MCG capsule Take 0.25 mcg by mouth daily. Patient not taking: Reported on 12/10/2021    [provider]  calcium elemental as carbonate (TUMS ULTRA 1000) 400 MG chewable tablet Chew 1,000 mg by mouth daily as needed for heartburn. Patient not taking: Reported on 12/10/2021    [provider]  dicyclomine (BENTYL) 10 MG capsule Take 1 capsule (10 mg total) by mouth in the morning, at noon, and at bedtime. 12/10/21   Levin Erp, PA  metoCLOPramide (REGLAN) 5 MG tablet Take 1 tablet (5 mg total) by mouth every 12 (twelve) hours as needed for up to 2 doses for nausea. Take 30-45 minutes before evening and AM doses of bowel preparation solution. Patient not taking: Reported on 11/23/2021 08/22/21   Doran Stabler, MD  ondansetron (ZOFRAN) 4 MG tablet Take 1 tablet (4 mg total) by mouth every 8 (eight) hours as needed for nausea or vomiting. Patient not taking: Reported on 11/23/2021 06/25/21   Doran Stabler, MD  pantoprazole (PROTONIX) 40 MG tablet TAKE 1 TABLET BY MOUTH EVERY DAY 12/25/21   [name], DO     Vitals:   01/01/22 1000 01/01/22 1015 01/01/22 1030 01/01/22 1045  BP: (!) 164/98 (!) 164/92 (!) 163/96 (!) 156/102  Pulse: 85 90 83 83  Resp:  17  16  Temp:      TempSrc:      SpO2: 100% 100% 100% 100%   Exam Gen alert, no distress No rash, cyanosis or gangrene Sclera anicteric, throat clear  No jvd or bruits Chest clear bilat to bases, no rales/ wheezing RRR no MRG Abd soft ntnd no mass or ascites +bs GU normal male MS no joint effusions or deformity Ext no LE or UE edema, no wounds or ulcers Neuro is alert, Ox 3 , nf RUA  AVF+bruit   Home meds include - amlodipine 10, metoclopramide, pantoprazole, calcitriol 0.25 qd, prns   OP HD: [facility] TTS 4h 400/600   74.4kg  2/2 bath P2  Heparin 1200  RUA AVF - last Hb 6.9 on 7/27, last tsat 53% - last HD 8/1, post wt was 74.4kg - mircera 100 q 2, last 8/1, due 8/15 - calcitriol 1.25 ug tiw po   Assessment/ Plan: Uremia/ azotemia - missed 3 weeks of HD due to homelessness. Needs admission for serial HD and  needs SW consult for public transportation to be set up for HD and any other needs.   ESRD - on HD TTS.  He says Tu-Sat 2x per week but not sure that is accurate. Will plan HD today and tomorrow here. Needs to get public transport assistance to HD.  HTN / vol - cont meds. Doesn't look vol overloaded, get wts. UF 2-3 L w/ hd MBD ckd - CCa in range, will add on phos.  Anemia esrd - overdue for esa will start darbe at 150 ug weekly while here. Last tsat was good in early Auguest.  HOH - hx Alport's      Kelly Splinter  MD 01/01/2022, 10:55 AM Recent Labs  Lab 01/01/22 0124  HGB 8.7*  ALBUMIN 3.9  CALCIUM 8.7*  CREATININE 21.18*  K 5.0

## 2022-01-01 NOTE — ED Provider Triage Note (Signed)
Emergency Medicine Provider Triage Evaluation Note  [name] , a 36 y.o. male  was evaluated in triage.  Pt complains of generalized weakness with leg swelling and intermittent dyspnea. No alleviating/aggravating factors. Hx of ESRD, has only gone to dialysis once this month, not sure when. Denies chest pain.   Review of Systems  Per above.   Physical Exam  BP (!) 169/105 (BP Location: Right Arm)   Pulse 83   Temp 98.3 F (36.8 C) (Oral)   Resp 18   SpO2 100%  Gen:   Awake, no distress   Resp:  Normal effort  MSK:   Moves extremities without difficulty   Medical Decision Making  Medically screening exam initiated at 1:16 AM.  Appropriate orders placed.  [name] was informed that the remainder of the evaluation will be completed by another provider, this initial triage assessment does not replace that evaluation, and the importance of remaining in the ED until their evaluation is complete.  Dyspnea Weakness.    [name], [place] 01/01/22 0117

## 2022-01-01 NOTE — ED Provider Notes (Signed)
[place] EMERGENCY DEPARTMENT Provider Note   CSN: [id number] Arrival date & time: 01/01/22  0107     History  Chief Complaint  Patient presents with   Leg Pain    bilateral    [name] is a 36 y.o. male with medical history of end-stage renal disease, difficulty hearing, hypertension, bipolar disorder, Alport syndrome.  Patient reports to ED for evaluation of bilateral leg pain, generalized weakness.  Patient reports that for the "last few weeks" he has had generalized weakness, bilateral leg pain.  The patient reports that over the last 2 days that his leg pain is acutely worsened without cause, denies trauma or preceding event.  The patient denies any event to account for this increased pain.  Patient states that he is currently homeless, living out of his car, patient states that his wife recently kicked him out.  Patient reports that he is on hemodialysis for end-stage renal disease, last time he was at dialysis was "maybe at the beginning of the month".  The patient has a history of noncompliance on dialysis.  Patient reports that he recently got into an "argument" with his dialysis center during a "family meeting" because he feels as if they are not entertaining his request for a kidney transplant.  Patient also states that over the last 2 weeks he has had increased orthopnea, shortness of breath while lying flat as well as intermittent chest pain that will begin around his graft site and then migrate towards his chest.  Patient states that the symptoms are all typical for when he has missed dialysis for long periods of time.  The patient denies any fevers, abdominal pain, nausea or vomiting.   Leg Pain Associated symptoms: no fever        Home Medications Prior to Admission medications   Medication Sig Start Date End Date Taking? Authorizing Provider  acetaminophen (TYLENOL) 500 MG tablet Take 500 mg by mouth every 6 (six) hours as needed for mild pain  or moderate pain.   Yes [provider]  amLODipine (NORVASC) 10 MG tablet Take 1 tablet (10 mg total) by mouth daily. 02/11/21  Yes Hensel, Jamal Collin, MD  calcitRIOL (ROCALTROL) 0.25 MCG capsule Take 0.25 mcg by mouth daily.   Yes [provider]  calcium elemental as carbonate (TUMS ULTRA 1000) 400 MG chewable tablet Chew 1,000 mg by mouth daily as needed for heartburn.   Yes [provider]  metoCLOPramide (REGLAN) 5 MG tablet Take 1 tablet (5 mg total) by mouth every 12 (twelve) hours as needed for up to 2 doses for nausea. Take 30-45 minutes before evening and AM doses of bowel preparation solution. 08/22/21  Yes Danis, Kirke Corin, MD  dicyclomine (BENTYL) 10 MG capsule Take 1 capsule (10 mg total) by mouth in the morning, at noon, and at bedtime. Patient not taking: Reported on 01/01/2022 12/10/21   Levin Erp, PA  pantoprazole (PROTONIX) 40 MG tablet TAKE 1 TABLET BY MOUTH EVERY DAY Patient not taking: Reported on 01/01/2022 12/25/21   [name], DO      Allergies    Zestril [lisinopril], Nsaids, and Risperdal [risperidone]    Review of Systems   Review of Systems  Constitutional:  Negative for fever.  Respiratory:  Positive for shortness of breath.   Cardiovascular:  Positive for chest pain.  Gastrointestinal:  Negative for abdominal pain, nausea and vomiting.  Musculoskeletal:  Positive for myalgias.  All other systems reviewed and are  negative.   Physical Exam Updated Vital Signs BP (!) 157/95   Pulse 84   Temp 97.9 F (36.6 C) (Oral)   Resp 16   SpO2 100%  Physical Exam Vitals and nursing note reviewed.  Constitutional:      General: He is not in acute distress.    Appearance: Normal appearance. He is not ill-appearing, toxic-appearing or diaphoretic.  HENT:     Head: Normocephalic and atraumatic.     Nose: Nose normal. No congestion.     Mouth/Throat:     Mouth: Mucous membranes are moist.     Pharynx: Oropharynx is  clear.  Eyes:     Extraocular Movements: Extraocular movements intact.     Conjunctiva/sclera: Conjunctivae normal.     Pupils: Pupils are equal, round, and reactive to light.  Cardiovascular:     Rate and Rhythm: Normal rate and regular rhythm.  Pulmonary:     Effort: Pulmonary effort is normal.     Breath sounds: Normal breath sounds. No wheezing.  Abdominal:     General: Abdomen is flat. Bowel sounds are normal.     Palpations: Abdomen is soft.     Tenderness: There is no abdominal tenderness.  Musculoskeletal:     Cervical back: Normal range of motion and neck supple. No tenderness.  Skin:    General: Skin is warm and dry.     Capillary Refill: Capillary refill takes less than 2 seconds.  Neurological:     General: No focal deficit present.     Mental Status: [name] and oriented to person, place, and time.     GCS: GCS eye subscore is 4. GCS verbal subscore is 5. GCS motor subscore is 6.     Cranial Nerves: Cranial nerves 2-12 are intact. No cranial nerve deficit.     Sensory: Sensation is intact. No sensory deficit.     Motor: Motor function is intact. No weakness.     Coordination: Coordination is intact. Heel to [facility] Test normal.     ED Results / Procedures / Treatments   Labs (all labs ordered are listed, but only abnormal results are displayed) Labs Reviewed  CBC WITH DIFFERENTIAL/PLATELET - Abnormal; Notable for the following components:      Result Value   RBC 2.87 (*)    Hemoglobin 8.7 (*)    HCT 27.1 (*)    All other components within normal limits  COMPREHENSIVE METABOLIC PANEL - Abnormal; Notable for the following components:   Chloride 112 (*)    CO2 11 (*)    BUN 134 (*)    Creatinine, Ser 21.18 (*)    Calcium 8.7 (*)    Total Protein 6.3 (*)    GFR, Estimated 3 (*)    All other components within normal limits  HEPATITIS B SURFACE ANTIGEN  HEPATITIS B SURFACE ANTIBODY,QUALITATIVE  HEPATITIS B SURFACE ANTIBODY, QUANTITATIVE  HEPATITIS B CORE  ANTIBODY, TOTAL  HEPATITIS C ANTIBODY  PHOSPHORUS  I-STAT CHEM 8, ED    EKG EKG Interpretation  Date/Time:  Wednesday January 01 2022 01:21:33 EDT Ventricular Rate:  82 PR Interval:  154 QRS Duration: 88 QT Interval:  378 QTC Calculation: 441 R Axis:   66 Text Interpretation: Normal sinus rhythm No significant change since last tracing Confirmed by Lajean Saver [phone]) on 01/01/2022 8:39:45 AM  Radiology DG Chest 2 View  Result Date: 01/01/2022 CLINICAL DATA:  Shortness of breath. EXAM: CHEST - 2 VIEW COMPARISON:  Chest radiograph dated 11/23/2021. FINDINGS: The  heart size and mediastinal contours are within normal limits. Both lungs are clear. The visualized skeletal structures are unremarkable. IMPRESSION: No active cardiopulmonary disease. Electronically Signed   By: Anner Crete M.D.   On: 01/01/2022 01:36    Procedures Procedures   Medications Ordered in ED Medications  Chlorhexidine Gluconate Cloth 2 % PADS 6 each (has no administration in time range)  Darbepoetin Alfa (ARANESP) injection 150 mcg (has no administration in time range)  Darbepoetin Alfa (ARANESP) injection 150 mcg (has no administration in time range)  Chlorhexidine Gluconate Cloth 2 % PADS 6 each (has no administration in time range)    ED Course/ Medical Decision Making/ A&P                           Medical Decision Making  36 year old male presents to the ED.  Please see HPI for further details.  On examination the patient is afebrile and nontachycardic.  The patient lung sounds are clear bilaterally, he is not hypoxic.  Patient abdomen soft and compressible.  Patient neurological examination shows no focal neurodeficits.  Patient worked up utilizing following labs and imaging studies interpreted by me personally: - CBC unremarkable - CMP with elevated creatinine of 21, in line with patient baseline.  Patient reports not receiving dialysis for quite some time.  Potassium unremarkable - Plain  film imaging of chest shows no consolidations, effusions, cardiomegaly - EKG is nonischemic, no peaked T waves  Patient stating he is in need of dialysis.  The patient reports that the symptoms are consistent with him missing dialysis, this is typically how he feels.  Dr. Jonnie Finner, nephrology, was consulted and states that this patient will need multiple rounds of dialysis, further management then will allow without admission.  Dr. Jonnie Finner has requested this patient be admitted to medicine and nephrology will consult.  Family practice has returned my call and has agreed to admit the patient.  The patient is stable at the time of admission.  Final Clinical Impression(s) / ED Diagnoses Final diagnoses:  Non-compliance with renal dialysis    Rx / DC Orders ED Discharge Orders     None         Lawana Chambers 01/01/22 1138    Lajean Saver, MD 01/02/22 1253

## 2022-01-01 NOTE — Progress Notes (Addendum)
FMTS Brief Progress Note  S: patient says he feels "loopy" after the dialysis session. He described feeling body cramps and loss of vision during his dialysis session today, he also says he "almost died." Otherwise has no complaints of chest pain or shortness of breath.   O: BP (!) 152/92 (BP Location: Left Arm)   Pulse 87   Temp 97.6 F (36.4 C) (Oral)   Resp 20   Ht 5\' 8"  (1.727 m)   Wt 71.1 kg   SpO2 100%   BMI 23.83 kg/m     A/P: Per nursing notes, patient did not tolerate dialysis due to body cramps. Nephrology is aware, follow-up recommendations in AM - Orders reviewed. Labs for AM ordered, which was adjusted as needed.  - If condition changes, plan includes urgent call to nephro.   Camelia Phenes, MD 01/01/2022, 11:32 PM PGY-1, [place] Night Resident  Please page [phone] with questions.

## 2022-01-01 NOTE — Assessment & Plan Note (Signed)
>>  ASSESSMENT AND PLAN FOR BLOODY DIARRHEA WRITTEN ON 01/03/2022 11:17 AM BY MILLER, EMILY, DO  Patient reports multiple episodes of bloody diarrhea that he is unable to control.  Reports has been ongoing for 3 to 4 months.  He saw GI previously and they did a colonoscopy that showed a few small mouth diverticula in the sigmoid and benign rectal bleeding, otherwise normal. Symptoms thought to be related to IBS. Patient's symptoms were well controlled on 10 mg dicyclomine TID.  - restart 10 mg dicyclomine TID for IBS-D - Hemoglobin 8.7 in the ED; also likely in the setting of ESRD - Transfuse if less than 7 - Consider GI consult if worsening anemia

## 2022-01-01 NOTE — Assessment & Plan Note (Signed)
Reports being kicked out of his house by his spouse. They are in the process of going through divorce.  He recently got a flat tire and is transportation has been limited, affecting his ability to go to HD. -TOC consult for housing resources

## 2022-01-01 NOTE — Assessment & Plan Note (Signed)
Reports that he has not been able to eat and days.  This is also been affecting his desire to attend HD as he feels sick with dialysis if on an empty stomach. -Consult TOC

## 2022-01-01 NOTE — Assessment & Plan Note (Signed)
He has not been prescribed any medication for his bipolar disease. - He appears to be doing well - Consider outpatient follow-up with psych

## 2022-01-01 NOTE — ED Notes (Signed)
Hemodialysis consent form signed by pt and this RN.

## 2022-01-01 NOTE — Progress Notes (Signed)
Contacted by nephrologist regarding pt's current situation. Contacted clinic to inquire further regarding pt's out-pt HD schedule,attendance, etc. Advised that pt has only received 6 HD treatments from June to August. Pt requested a Tuesday/Saturday schedule of which Market researcher and clinic agreed to. Pt's schedule was to be Tues/Saturday to arrive at 12:00 for 12:20 chair time. Pt agreed to this schedule after requesting it but has not shown compliance to attending treatments. Clinic staff have attempted to reach pt to see how they can possibly assist but have been unable to reach pt via phone. Update provided to nephrologist. Advised provider that pt would benefit from a case management/CSW consult to assist with resources and possible options for assistance at d/c. Will plan to meet with pt tomorrow. Will assist as needed.   Melven Sartorius Renal Navigator [phone]

## 2022-01-01 NOTE — H&P (Addendum)
Hospital Admission History and Physical Service Pager: [phone]  Patient name: [name] Medical record number: [id number] Date of Birth: 02/19/1986 Age: 36 y.o. Gender: male  Primary Care Provider: [name], DO Consultants: Nephrology  Code Status: FULL CODE  Preferred Emergency Contact:  Contact Information     Name Relation Home Work [name] Spouse   [phone]   Chief Complaint: Missed dialysis   Assessment and Plan: [name] is a 36 y.o. male presenting with this, leg swelling, difficulty breathing while laying down ongoing for several weeks.  He has been unable to get dialysis for 1 month due to his housing and food insecurity. Likely cause of this patient's presentation is volume overload in the setting of ESRD- doubt new CHF exacerbation, PE (wells 0), PNA, PTX.     * ESRD (end stage renal disease) ([place]) Has been unable to get dialysis in 1 month.  Urine creatinine is 21.18.  Electrolytes within normal limits. - Admit to FMTS, med-surg, attending Dr. Andria Frames - Nephrology consulted, following - Continue phosphate binder - Continue BMPs to monitor electrolytes  Food insecurity Reports that he has not been able to eat and days.  This is also been affecting his desire to attend HD as he feels sick with dialysis if on an empty stomach. -Consult TOC  Housing insecurity Reports being kicked out of his house by his spouse. They are in the process of going through divorce.  He recently got a flat tire and is transportation has been limited, affecting his ability to go to HD. 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 Patient does not have his hearing aids with him in the ED.  Bloody diarrhea Patient reports multiple episodes of bloody diarrhea that he is unable to control.  Reports has been ongoing for 3 to 4 months.  He saw GI previously and they did a colonoscopy that showed a few small mouth  diverticula in the sigmoid and benign rectal bleeding, otherwise normal. Symptoms thought to be related to IBS.  - Hemoglobin 8.7 in the ED; also likely in the setting of ESRD - Transfuse if less than 7 - Consider GI consult if worsening anemia  GERD (gastroesophageal reflux disease) Patient reports history of belching and hiccups. - Start 40 mg p.o. Protonix  Hypertension He is reportedly taking his outpatient medicine 10 mg Norvasc. BP elevated in the ED. Likely multifactorial and could be related to volume overload. - Continue amlodipine inpatient - Monitor after HD  Bipolar 1 disorder ([place]) He has not been prescribed any medication for his bipolar disease. - He appears to be doing well - Consider outpatient follow-up with psych   FEN/GI: Renal diet VTE Prophylaxis: Subq heparin   Disposition: MedSurg  History of Present Illness:  [name] is a 36 y.o. male presenting after missed dialysis sessions.   He came to the ED today because he is having pain in his legs. He also feels very weak. This has been ongoing for "a while" but worsened in the last two days. His feet have been swelling on and off for the last month or two. He has mentioned this to a dialysis provider in the past.   Reports that the last time he went to HD was the beginning of this month. States that he is currently homeless at the moment. States that he is not eating enough to go to dialysis. States that he would feel bad  if he were to go to dialysis on an empty stomach.   He is taking his medications faithfully. Taking his blood pressure medication daily. He was on something for his colon but he doesn't take it often. He occasionally takes TUMS and Tylenol.   Endorses chest pain. States that this is radiating pain from his fistula in his arm. States that sometimes he has trouble breathing at night when he lays down- he has to sit up to catch his breath.   He had a recent colonoscopy in June. States that  ever since then he has had "a lot of diarrhea". He also burps often and it tastes like blood. He also reports that he has been having "a whole lot of blood" with his bowel movements. He recently saw his GI provider.    States that there was a medication he was given that made him poop a lot more, made his symptoms worse.   In the ED, his CBC W NL, CMP elevated creatinine 21, CXR shows no consolidations, effusions, cardiomegaly.  EKG is nonischemic, no peaked T waves.  Provider consulted nephrology Dr. Jonnie Finner.  He recommended patient needing multiple rounds of dialysis and necessity for admission.  Review Of Systems: Per HPI with the following additions: Patient has been having chronic diarrhea ongoing for 3 to 4 months. Positive for shortness of breath when laying down and improves with sitting and standing. Positive for bloody diarrhea Positive for belching, hiccups with taste of iron.  Pertinent Past Medical History: HTN ESRD on HD due to Alport Syndrome  Secondary PTH Remainder reviewed in history tab.   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  Pertinent Social History: Tobacco use: Black and milds sometimes Alcohol use: Off alcohol for 15-20 years  Other Substance use: Marijuana  Currently facing housing insecurity   Pertinent Family History: Mom passed in 2008 from diabetes and ESRD (also on HD) Dad "has a lot lot of issues they're not telling me". They do not have a close relationship  Remainder reviewed in history tab.   Important Outpatient Medications: Taking 10 mg amlodipine and Tums. Remainder reviewed in medication history.   Objective: BP (!) 167/104   Pulse 84   Temp 97.9 F (36.6 C) (Oral)   Resp 16   SpO2 100%  Exam: General: Chronically ill-appearing, no acute distress, pleasant, hard of hearing Cardiovascular: Regular rate, regular rhythm, fistula murmur  appreciated Respiratory: No increased work of breathing, lungs clear bilaterally Gastrointestinal: Normal bowel sounds, abdomen soft, nontender Extremities: Trace edema  Labs:  CBC BMET  Recent Labs  Lab 01/01/22 0124  WBC 4.6  HGB 8.7*  HCT 27.1*  PLT 158   Recent Labs  Lab 01/01/22 0124  NA 137  K 5.0  CL 112*  CO2 11*  BUN 134*  CREATININE 21.18*  GLUCOSE 90  CALCIUM 8.7*      EKG: Regular rate, regular rhythm, normal axis, no ST elevations, no abnormal T waves.  Imaging Studies Performed:  CXR:  Impression from Radiologist: No active cardiopulmonary disease My Interpretation: Normal cardiac footprint, clear costo angles, no evidence of pulmonary edema.  Darci Current, DO 01/01/2022, 12:13 PM PGY-1, [place] Intern pager: [phone], text pages welcome Secure chat group [place] Upper-Level Resident Addendum   I have independently interviewed and examined the patient. I have discussed the above with the original  Chief Strategy Officer and agree with their documentation. My edits for correction/addition/clarification are included where appropriate. Please see also any attending notes.   [name], DO PGY-3, [facility] 01/01/2022 12:33 PM  FPTS Service pager: [phone] (text pages welcome through [place])

## 2022-01-01 NOTE — ED Notes (Signed)
Ulice Brilliant, cousin, [phone] would like an update when available.

## 2022-01-01 NOTE — ED Triage Notes (Signed)
Pt via [facility] from the [facility], has missed dialysis x "awhile." Endorses BLE swelling, pain, "some" SHOB. Pt c/o feeling "weak, like when I stand up something's gonna happen." Pt A&O in triage, no distress noted.  678 systolic 93% RA

## 2022-01-01 NOTE — Assessment & Plan Note (Addendum)
He is reportedly taking his outpatient medicine 10 mg Norvasc. BP elevated in the ED. Likely multifactorial and could be related to volume overload. - Continue amlodipine inpatient - Monitor after HD

## 2022-01-01 NOTE — Progress Notes (Addendum)
Received patient in bed to unit from ED Alert and oriented.  Informed consent signed and in chart.   Treatment initiated: [place] Treatment completed: 1832  Patient did not tolerate procedure d/t leg cramping. NS bolus 100 m1 x3 during tx. Pt with severe anxiety and restlessness towards end of treatment d/t entire body cramping, additional NS bolus 100 cc given in addition to mustard by mouth. Dr Joylene Grapes notified of pt cramping and treatment given by nursing.  Pt transported to 5M16 Alert, calm and without acute distress.  Hand-off given to patient's nurse.   Access used: fistula right arm  Access issues:none   Total UF removed: 2 liters Medication(s) given: aranesp, heparin  Post HD VS: 175/95 MAP 112 HR 80 RR 20 Sat 100% on room air Temp oral 97.6 Post HD weight: 70.0 kg    [facility]

## 2022-01-01 NOTE — Assessment & Plan Note (Addendum)
Has been unable to get dialysis in 1 month.  Urine creatinine on admission 21.18.  Electrolytes within normal limits. - Nephrology consulted, following - Continue phosphate binder, phosphate 8.2 on 8/25 - Continue BMPs to monitor electrolytes

## 2022-01-01 NOTE — Procedures (Signed)
   I was present at this dialysis session, have reviewed the session itself and made  appropriate changes Kelly Splinter MD [place] pager [phone]   01/01/2022, 3:56 PM

## 2022-01-01 NOTE — Assessment & Plan Note (Addendum)
From his Alport syndrome. Patient does not have his hearing aids with him in the ED.

## 2022-01-02 DIAGNOSIS — K219 Gastro-esophageal reflux disease without esophagitis: Secondary | ICD-10-CM | POA: Diagnosis present

## 2022-01-02 DIAGNOSIS — I739 Peripheral vascular disease, unspecified: Secondary | ICD-10-CM | POA: Diagnosis not present

## 2022-01-02 DIAGNOSIS — I120 Hypertensive chronic kidney disease with stage 5 chronic kidney disease or end stage renal disease: Secondary | ICD-10-CM | POA: Diagnosis present

## 2022-01-02 DIAGNOSIS — Z825 Family history of asthma and other chronic lower respiratory diseases: Secondary | ICD-10-CM | POA: Diagnosis not present

## 2022-01-02 DIAGNOSIS — Z5941 Food insecurity: Secondary | ICD-10-CM | POA: Diagnosis not present

## 2022-01-02 DIAGNOSIS — Z992 Dependence on renal dialysis: Secondary | ICD-10-CM | POA: Diagnosis not present

## 2022-01-02 DIAGNOSIS — M79661 Pain in right lower leg: Secondary | ICD-10-CM | POA: Diagnosis not present

## 2022-01-02 DIAGNOSIS — F319 Bipolar disorder, unspecified: Secondary | ICD-10-CM | POA: Diagnosis present

## 2022-01-02 DIAGNOSIS — Z841 Family history of disorders of kidney and ureter: Secondary | ICD-10-CM | POA: Diagnosis not present

## 2022-01-02 DIAGNOSIS — Z91199 Patient's noncompliance with other medical treatment and regimen due to unspecified reason: Secondary | ICD-10-CM | POA: Diagnosis not present

## 2022-01-02 DIAGNOSIS — Z597 Insufficient social insurance and welfare support: Secondary | ICD-10-CM | POA: Diagnosis not present

## 2022-01-02 DIAGNOSIS — R0602 Shortness of breath: Secondary | ICD-10-CM | POA: Diagnosis present

## 2022-01-02 DIAGNOSIS — M79662 Pain in left lower leg: Secondary | ICD-10-CM | POA: Diagnosis not present

## 2022-01-02 DIAGNOSIS — Z823 Family history of stroke: Secondary | ICD-10-CM | POA: Diagnosis not present

## 2022-01-02 DIAGNOSIS — Z886 Allergy status to analgesic agent status: Secondary | ICD-10-CM | POA: Diagnosis not present

## 2022-01-02 DIAGNOSIS — Z91158 Patient's noncompliance with renal dialysis for other reason: Secondary | ICD-10-CM | POA: Diagnosis not present

## 2022-01-02 DIAGNOSIS — N186 End stage renal disease: Secondary | ICD-10-CM | POA: Diagnosis present

## 2022-01-02 DIAGNOSIS — K5730 Diverticulosis of large intestine without perforation or abscess without bleeding: Secondary | ICD-10-CM | POA: Diagnosis present

## 2022-01-02 DIAGNOSIS — D631 Anemia in chronic kidney disease: Secondary | ICD-10-CM | POA: Diagnosis present

## 2022-01-02 DIAGNOSIS — N2581 Secondary hyperparathyroidism of renal origin: Secondary | ICD-10-CM | POA: Diagnosis present

## 2022-01-02 DIAGNOSIS — Z87891 Personal history of nicotine dependence: Secondary | ICD-10-CM | POA: Diagnosis not present

## 2022-01-02 DIAGNOSIS — K921 Melena: Secondary | ICD-10-CM | POA: Diagnosis present

## 2022-01-02 DIAGNOSIS — Q8781 Alport syndrome: Secondary | ICD-10-CM | POA: Diagnosis not present

## 2022-01-02 DIAGNOSIS — Z8249 Family history of ischemic heart disease and other diseases of the circulatory system: Secondary | ICD-10-CM | POA: Diagnosis not present

## 2022-01-02 DIAGNOSIS — E8770 Fluid overload, unspecified: Secondary | ICD-10-CM | POA: Diagnosis present

## 2022-01-02 DIAGNOSIS — Z833 Family history of diabetes mellitus: Secondary | ICD-10-CM | POA: Diagnosis not present

## 2022-01-02 DIAGNOSIS — H9190 Unspecified hearing loss, unspecified ear: Secondary | ICD-10-CM | POA: Diagnosis present

## 2022-01-02 LAB — RENAL FUNCTION PANEL
Albumin: 3.3 g/dL — ABNORMAL LOW (ref 3.5–5.0)
Anion gap: 13 (ref 5–15)
BUN: 96 mg/dL — ABNORMAL HIGH (ref 6–20)
CO2: 20 mmol/L — ABNORMAL LOW (ref 22–32)
Calcium: 7.8 mg/dL — ABNORMAL LOW (ref 8.9–10.3)
Chloride: 105 mmol/L (ref 98–111)
Creatinine, Ser: 15.64 mg/dL — ABNORMAL HIGH (ref 0.61–1.24)
GFR, Estimated: 4 mL/min — ABNORMAL LOW (ref >60)
Glucose, Bld: 132 mg/dL — ABNORMAL HIGH (ref 70–99)
Phosphorus: 8.2 mg/dL — ABNORMAL HIGH (ref 2.5–4.6)
Potassium: 3.6 mmol/L (ref 3.5–5.1)
Sodium: 138 mmol/L (ref 135–145)

## 2022-01-02 LAB — MAGNESIUM: Magnesium: 1.8 mg/dL (ref 1.7–2.4)

## 2022-01-02 LAB — HEPATITIS B SURFACE ANTIBODY, QUANTITATIVE: Hep B S AB Quant (Post): 403.8 m[IU]/mL (ref >9.9)

## 2022-01-02 MED ORDER — CYCLOBENZAPRINE HCL 5 MG PO TABS
5.0000 mg | ORAL_TABLET | Freq: Two times a day (BID) | ORAL | Status: DC | PRN
Start: 2022-01-03 — End: 2022-01-03
  Administered 2022-01-03: 5 mg via ORAL
  Filled 2022-01-02: qty 1

## 2022-01-02 MED ORDER — CYCLOBENZAPRINE HCL 5 MG PO TABS
5.0000 mg | ORAL_TABLET | ORAL | Status: DC
  Administered 2022-01-02: 5 mg via ORAL
  Filled 2022-01-02: qty 1

## 2022-01-02 MED ORDER — CYCLOBENZAPRINE HCL 5 MG PO TABS
5.0000 mg | ORAL_TABLET | Freq: Once | ORAL | Status: AC
Start: 2022-01-02 — End: 2022-01-02
  Administered 2022-01-02: 5 mg via ORAL
  Filled 2022-01-02: qty 1

## 2022-01-02 MED ORDER — CALCIUM ACETATE (PHOS BINDER) 667 MG PO CAPS
667.0000 mg | ORAL_CAPSULE | Freq: Three times a day (TID) | ORAL | Status: DC
  Administered 2022-01-02 – 2022-01-04 (×5): 667 mg via ORAL
  Filled 2022-01-02 (×5): qty 1

## 2022-01-02 NOTE — Progress Notes (Signed)
[facility] Progress Note   Subjective:   Reported cramping during HD. Says he feels sick and tired this AM, ongoing nausea. Also reports mild SOB. Refused AM labs  Objective Vitals:   01/01/22 2002 01/02/22 0015 01/02/22 0519 01/02/22 0951  BP: (!) 152/92 (!) 153/87 (!) 150/73 135/66  Pulse: 87 86 87 85  Resp: 20 19 19 17   Temp: 97.6 F (36.4 C) 98.3 F (36.8 C) 98.7 F (37.1 C) 98.3 F (36.8 C)  TempSrc: Oral  Oral   SpO2: 100% 98% 100% 100%  Weight: 71.1 kg     Height: 5\' 8"  (1.727 m)      Physical Exam General: Alert male in NAD Heart: RRR, no murmurs, rubs or gallops Lungs: CTA bilaterally, respirations unlabored on RA Abdomen: Soft, non-distended, +BS Extremities: No edema b/l lower extremities Dialysis Access:  RUE AVF +bruit  Additional Objective Labs: Basic Metabolic Panel: Recent Labs  Lab 01/01/22 0124 01/01/22 1130  NA 137  --   K 5.0  --   CL 112*  --   CO2 11*  --   GLUCOSE 90  --   BUN 134*  --   CREATININE 21.18*  --   CALCIUM 8.7*  --   PHOS  --  8.9*   Liver Function Tests: Recent Labs  Lab 01/01/22 0124  AST 19  ALT 43  ALKPHOS 76  BILITOT 0.4  PROT 6.3*  ALBUMIN 3.9   No results for input(s): "LIPASE", "AMYLASE" in the last 168 hours. CBC: Recent Labs  Lab 01/01/22 0124  WBC 4.6  NEUTROABS 2.7  HGB 8.7*  HCT 27.1*  MCV 94.4  PLT 158   Blood Culture    Component Value Date/Time   SDES THROAT 12/05/2015 2251   SPECREQUEST NONE Reflexed from [id number] 12/05/2015 2251   CULT  12/05/2015 2251    NO GROUP A STREP (S.PYOGENES) ISOLATED Performed at [facility] 12/09/2015 FINAL 12/05/2015 2251    Cardiac Enzymes: No results for input(s): "CKTOTAL", "CKMB", "CKMBINDEX", "TROPONINI" in the last 168 hours. CBG: No results for input(s): "GLUCAP" in the last 168 hours. Iron Studies: No results for input(s): "IRON", "TIBC", "TRANSFERRIN", "FERRITIN" in the last 72  hours. @lablastinr3 @ Studies/Results: DG Chest 2 View  Result Date: 01/01/2022 CLINICAL DATA:  Shortness of breath. EXAM: CHEST - 2 VIEW COMPARISON:  Chest radiograph dated 11/23/2021. FINDINGS: The heart size and mediastinal contours are within normal limits. Both lungs are clear. The visualized skeletal structures are unremarkable. IMPRESSION: No active cardiopulmonary disease. Electronically Signed   By: Anner Crete M.D.   On: 01/01/2022 01:36   Medications:   amLODipine  10 mg Oral Daily   calcitRIOL  0.25 mcg Oral Daily   calcium acetate  667 mg Oral TID WC   Chlorhexidine Gluconate Cloth  6 each Topical Q0600   Chlorhexidine Gluconate Cloth  6 each Topical Q0600   [START ON 01/07/2022] darbepoetin (ARANESP) injection - DIALYSIS  150 mcg Intravenous Q Tue-HD   heparin  5,000 Units Subcutaneous Q8H    Dialysis Orders: [facility] 4h 400/600   74.4kg  2/2 bath P2  Heparin 1200  RUA AVF - last Hb 6.9 on 7/27, last tsat 53% - last HD 8/1, post wt was 74.4kg - mircera 100 q 2, last 8/1, due 8/15 - calcitriol 1.25 ug tiw po  Assessment/Plan: Uremia/ azotemia - missed 3 weeks of HD due to homelessness, very poor HD compliance overall. Needs admission for serial  HD and needs SW consult for public transportation to be set up for HD and any other needs.   ESRD - on HD TTS.  Getting serial HD yesterday and today d/t uremia. Needs to get public transport assistance to HD.  HTN / vol - BP moderately elevated, continue amlodipine. Had severe cramping with HD yesterday and is below his outpatient EDW. 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 overdue for esa will start darbe at 150 ug weekly while here. Last tsat was good in early August.  [place] - hx Alport's    Anice Paganini, PA-C 01/02/2022, 10:40 AM  [facility] Pager: [phone]

## 2022-01-02 NOTE — Progress Notes (Signed)
Daily Progress Note Intern Pager: [phone]  Patient name: [name] Medical record number: [id number] Date of birth: [birth year] Age: 36 y.o. Gender: male  Primary Care Provider: [name], DO Consultants: Nephrology  Code Status: Full   Pt Overview and Major Events to Date:  - Admitted to FMTS 8/23  Assessment and Plan:  [name] is a 36 y.o. male presenting for generalized weakness, leg swelling, and no dialysis in over 1 month. Past medical history is significant for ESRD, Alport syndrome with associated hearing loss, Hypertension, GERD, bloody diarrhea, bipolar 1 disorder, and food/housing insecurity.   * ESRD (end stage renal disease) ([place]) Has been unable to get dialysis in 1 month.  Urine creatinine on admission 21.18.  Electrolytes within normal limits. - Nephrology consulted, following - Continue phosphate binder, phosphate 8.9 on 8/23 - Continue BMPs to monitor electrolytes  Hypertension He is reportedly taking his outpatient medicine 10 mg Norvasc. BP continues to be elevated, but improved after HD.HTN likely multifactorial and related to volume overload. - Continue amlodipine inpatient - Monitor BP after HD, if no improvement after several sessions HD consider a second agent  Bloody diarrhea Patient reports multiple episodes of bloody diarrhea that he is unable to control.  Reports has been ongoing for 3 to 4 months.  He saw GI previously and they did a colonoscopy that showed a few small mouth diverticula in the sigmoid and benign rectal bleeding, otherwise normal. Symptoms thought to be related to IBS.  - Hemoglobin 8.7 in the ED; also likely in the setting of ESRD - Transfuse if less than 7 - Consider GI consult if worsening anemia  Hearing loss From his Alport syndrome. Patient does not have his hearing aids with him in the ED.  Food insecurity Reports that he has not been able to eat and days.  This is also been affecting his desire  to attend HD as he feels sick with dialysis if on an empty stomach. -Consult TOC  Housing insecurity Reports being kicked out of his house by his spouse. They are in the process of going through divorce.  He recently got a flat tire and is transportation has been limited, affecting his ability to go to HD. -TOC consult for housing resources  GERD (gastroesophageal reflux disease) Patient reports history of belching and hiccups. - Start 40 mg p.o. Protonix  Bipolar 1 disorder ([place]) He has not been prescribed any medication for his bipolar disease. - He appears to be doing well - Consider outpatient follow-up with psych   FEN/GI: Renal Diet  PPx: Subq heparin  Dispo:Home pending clinical improvement . Barriers include dialysis.   Subjective:  Patient woken up for my exam.  He denied being in pain.  He reports being able to eat breakfast this morning.  He continues to have mild cramping in his legs that was improved with 5 mg Flexeril.  Objective: Temp:  [97.6 F (36.4 C)-98.7 F (37.1 C)] 98.7 F (37.1 C) (08/24 0519) Pulse Rate:  [71-90] 87 (08/24 0519) Resp:  [13-20] 19 (08/24 0519) BP: (134-175)/(73-112) 150/73 (08/24 0519) SpO2:  [96 %-100 %] 100 % (08/24 0519) Weight:  [70 kg-72.6 kg] 71.1 kg (08/23 2002) Physical Exam: General: Somnolent, no acute distress Cardiovascular: Regular rate, regular rhythm, fistula murmur Respiratory: Air, no crackles, no wheezing.  No increased work of breathing Abdomen: Soft, nontender Extremities: No peripheral edema  Laboratory: Most recent CBC Lab Results  Component Value Date   WBC 4.6  01/01/2022   HGB 8.7 (L) 01/01/2022   HCT 27.1 (L) 01/01/2022   MCV 94.4 01/01/2022   PLT 158 01/01/2022   Most recent BMP    Latest Ref Rng & Units 01/01/2022    1:24 AM  BMP  Glucose 70 - 99 mg/dL 90   BUN 6 - 20 mg/dL 134   Creatinine 0.61 - 1.24 mg/dL 21.18   Sodium 135 - 145 mmol/L 137   Potassium 3.5 - 5.1 mmol/L 5.0   Chloride 98 -  111 mmol/L 112   CO2 22 - 32 mmol/L 11   Calcium 8.9 - 10.3 mg/dL 8.7    Patient refused morning labs, 8/24 labs pending.   Darci Current, DO 01/02/2022, 9:19 AM  PGY-1, [place] Intern pager: [phone], text pages welcome Secure chat group [place]

## 2022-01-02 NOTE — Progress Notes (Signed)
NEW ADMISSION NOTE New Admission Note:   Arrival Method: Stretcher from dialysis Mental Orientation: Alert and Oriented x4 Telemetry: None Assessment: Initiated Skin: Intact IV: Left hand, saline locked Pain: 7/10 Tubes: None Safety Measures: Safety Fall Prevention Plan has been given, discussed and signed Admission: Initiated [facility] [place] Orientation: Patient has been orientated to the room, unit and staff.  Family: Not present at the bedside  Orders have been reviewed and implemented. Will continue to monitor the patient. Call light has been placed within reach and bed alarm has been activated.   Sharmon Revere, RN

## 2022-01-03 DIAGNOSIS — N186 End stage renal disease: Secondary | ICD-10-CM | POA: Diagnosis not present

## 2022-01-03 DIAGNOSIS — M79662 Pain in left lower leg: Secondary | ICD-10-CM | POA: Diagnosis not present

## 2022-01-03 DIAGNOSIS — M79661 Pain in right lower leg: Secondary | ICD-10-CM | POA: Diagnosis not present

## 2022-01-03 DIAGNOSIS — M79605 Pain in left leg: Secondary | ICD-10-CM

## 2022-01-03 DIAGNOSIS — M79604 Pain in right leg: Secondary | ICD-10-CM

## 2022-01-03 MED ORDER — CHLORHEXIDINE GLUCONATE CLOTH 2 % EX PADS: 6.0000 | MEDICATED_PAD | Freq: Every day | CUTANEOUS | Status: DC

## 2022-01-03 MED ORDER — DICYCLOMINE HCL 10 MG PO CAPS
10.0000 mg | ORAL_CAPSULE | Freq: Three times a day (TID) | ORAL | Status: DC
  Administered 2022-01-04 (×2): 10 mg via ORAL
  Filled 2022-01-03 (×3): qty 1

## 2022-01-03 MED ORDER — RENA-VITE PO TABS
1.0000 | ORAL_TABLET | Freq: Every day | ORAL | Status: DC
  Administered 2022-01-03: 1 via ORAL
  Filled 2022-01-03: qty 1

## 2022-01-03 NOTE — Hospital Course (Signed)
[name] is a 37 y.o. male presenting for generalized weakness, leg swelling, and no dialysis in over 1 month. Past medical history is significant for ESRD, Alport syndrome with associated hearing loss, Hypertension, GERD, bloody diarrhea, bipolar 1 disorder, and food/housing insecurity.  His hospital course outlined below:  ESRD: Patient has been unable to get dialysis in over a month.  Urine creatinine on admission was 21.18.  Nephrology was consulted and recommended serial HD sessions.  His electrolytes were normal and on admission and remained stable throughout dialysis.  His phosphate on admission was 8.9.  He was started on a phosphate binder and his phosphate down trended and remained stable.  His discharge weight was***.  Bilateral leg pain: On presentation patient endorsed bilateral leg pain and cramping that was affecting his ambulation.  He received multiple doses of Tylenol and Flexeril with no relief of symptoms.  It is expected that this is due to hyper uremic syndrome and hypocalcemia.  Vitamin B12 was checked and resulted at***.  PC was consulted and they recommended***.  Hypertension: His blood pressure on admission was elevated.  He reportedly taking 10 mg Norvasc at home.  It was thought his blood pressure was due to volume overload due to missed dialysis.  His blood pressure improved to***before discharge.  Bloody diarrhea  IBS-D: Patient reports multiple episodes of bloody diarrhea that he is unable to control.  He was seen by GI previously and diagnosed with IBS D.  Colonoscopy was normal with only small amount of diverticulitis in the sigmoid and benign rectal bleeding.  He was restarted on his home dicyclomine 10 mg 3 times daily with resolution of symptoms***.  His hemoglobin remained stable while admitted.  Follow-up outpatient:  Follow-up with GI with IBS-D Follow-up with nephrology outpatient for HD

## 2022-01-03 NOTE — Progress Notes (Signed)
Subjective: Seen in room complaining of bilateral calf pain but was ambulating in his room with a limp.  Noted Marshall Cork SW note patient agrees to be compliant with OP HD schedule 6:30 AM Tuesday Saturdays agrees for dialysis tomorrow and possible discharge if leg pain better  Objective Vital signs in last 24 hours: Vitals:   01/02/22 2140 01/03/22 0523 01/03/22 0535 01/03/22 0928  BP: (!) 148/88  (!) 161/91 (!) 144/87  Pulse: 89  88 83  Resp: 18  17 17   Temp: 97.9 F (36.6 C)  97.9 F (36.6 C) 98.7 F (37.1 C)  TempSrc: Oral  Oral Oral  SpO2: 99%  100% 100%  Weight:  69.9 kg    Height:       Weight change: -1.6 kg  Physical Exam: General: Alert young male NAD, hard of hearing chronic Heart: RRR no MRG Lungs: CTA bilaterally nonlabored breathing Abdomen: NABS, soft, NT, ND no ascites Extremities: Pedal edema Dialysis Access: Right UE AV fistula positive bruit   Dialysis Orders: [facility] 4h 400/600   74.4kg  2/2 bath P2  Heparin 1200  RUA AVF - last Hb 6.9 on 7/27, last tsat 53% - last HD 8/1, post wt was 74.4kg - mircera 100 q 2, last 8/1, due 8/15 - calcitriol 1.25 ug tiw po   Problem/Plan: Uremia/azotemia= missed 3 weeks of HD treatment with very poor HD compliance overall, had serial HD's during this admission, SW helping with transportation and scheduling at [facility] as above ESRD  in hospital TTS, outpatient will be Thursday Saturdays 4 hours HTN/volume -BP mildly elevated on admission improved with amlodipine and UF with HD but was outpatient EDW still makes urine plan for HD MR Anemia -Hgb 8.7, has missed outpatient ESA, started Aranesp 150 last given 8/24 Secondary hyperparathyroidism -corrected calcium okay, phosphorus elevated with noncompliance outpatient binder start binders in hospital check PTH currently on calcitriol q. dialysis HOH= history of Alport's  Ernest Haber, PA-C [facility] Bee[facility] [phone] 01/03/2022,3:31 PM  LOS: 1 day    Labs: Basic Metabolic Panel: Recent Labs  Lab 01/01/22 0124 01/01/22 1130 01/02/22 0929  NA 137  --  138  K 5.0  --  3.6  CL 112*  --  105  CO2 11*  --  20*  GLUCOSE 90  --  132*  BUN 134*  --  96*  CREATININE 21.18*  --  15.64*  CALCIUM 8.7*  --  7.8*  PHOS  --  8.9* 8.2*   Liver Function Tests: Recent Labs  Lab 01/01/22 0124 01/02/22 0929  AST 19  --   ALT 43  --   ALKPHOS 76  --   BILITOT 0.4  --   PROT 6.3*  --   ALBUMIN 3.9 3.3*   No results for input(s): "LIPASE", "AMYLASE" in the last 168 hours. No results for input(s): "AMMONIA" in the last 168 hours. CBC: Recent Labs  Lab 01/01/22 0124  WBC 4.6  NEUTROABS 2.7  HGB 8.7*  HCT 27.1*  MCV 94.4  PLT 158   Cardiac Enzymes: No results for input(s): "CKTOTAL", "CKMB", "CKMBINDEX", "TROPONINI" in the last 168 hours. CBG: No results for input(s): "GLUCAP" in the last 168 hours.  Studies/Results: No results found. Medications:   amLODipine  10 mg Oral Daily   calcitRIOL  0.25 mcg Oral Daily   calcium acetate  667 mg Oral TID WC   Chlorhexidine Gluconate Cloth  6 each Topical Q0600   Chlorhexidine Gluconate Cloth  6  each Topical Q0600   [START ON 01/07/2022] darbepoetin (ARANESP) injection - DIALYSIS  150 mcg Intravenous Q Tue-HD   dicyclomine  10 mg Oral TID AC   heparin  5,000 Units Subcutaneous Q8H   multivitamin  1 tablet Oral QHS

## 2022-01-03 NOTE — Evaluation (Signed)
Physical Therapy Evaluation Patient Details Name: [name] MRN: [id number] DOB: November 28, 1985 Today's Date: 01/03/2022  History of Present Illness  36 y.o. male presents to [facility] on 01/01/2022 with weakness, LE swelling, having missed dialysis for over a month. PMH includes ESRD, Alport syndrome with hearing loss, HTN, bipolar disorder.  Clinical Impression  Pt presents to PT with deficits in strength, power, endurance, neuromuscular control. Pt reports cramping and soreness in BLE, along with tremors of muscles with fatigue. Pt is currently able to ambulate and negotiate stairs without fatigue. Pt will benefit from continued aggressive mobilization in an effort to restore his independence. PT attempts to  provide education on the need for consistent attendance for dialysis to maintain proper muscle function. PT anticipates the pt will quickly return to baseline with further dialysis.     Recommendations for follow up therapy are one component of a multi-disciplinary discharge planning process, led by the attending physician.  Recommendations may be updated based on patient status, additional functional criteria and insurance authorization.  Follow Up Recommendations No PT follow up      Assistance Recommended at Discharge PRN  Patient can return home with the following  Help with stairs or ramp for entrance    Equipment Recommendations None recommended by PT  Recommendations for Other Services       Functional Status Assessment Patient has had a recent decline in their functional status and demonstrates the ability to make significant improvements in function in a reasonable and predictable amount of time.     Precautions / Restrictions Precautions Precautions: Fall Restrictions Weight Bearing Restrictions: No      Mobility  Bed Mobility Overal bed mobility: Independent                  Transfers Overall transfer level: Independent                       Ambulation/Gait Ambulation/Gait assistance: Supervision Gait Distance (Feet): 300 Feet Assistive device: None Gait Pattern/deviations: Step-through pattern Gait velocity: functional Gait velocity interpretation: 1.31 - 2.62 ft/sec, indicative of limited community ambulator   General Gait Details: pt reports tremors of quad muscles when ambulating, although no knee buckling noticed. mild increase in lateral sway  Stairs Stairs: Yes Stairs assistance: Supervision Stair Management: One rail Right, Step to pattern Number of Stairs: 8    Wheelchair Mobility    Modified Rankin (Stroke Patients Only)       Balance Overall balance assessment: Needs assistance Sitting-balance support: No upper extremity supported, Feet supported Sitting balance-Leahy Scale: Good     Standing balance support: No upper extremity supported, During functional activity Standing balance-Leahy Scale: Good                               Pertinent Vitals/Pain Pain Assessment Pain Assessment: Faces Faces Pain Scale: Hurts little more Pain Location: legs Pain Descriptors / Indicators: Cramping Pain Intervention(s): Monitored during session    Home Living Family/patient expects to be discharged to:: Private residence Living Arrangements: Spouse/significant other (separating per patient, unsure of availability to assist) Available Help at Discharge: Family Type of Home: Homeless (pt reports he is in between living at home with his spouse and in his car) Home Access: Stairs to enter   Technical brewer of Steps: flight (pt reports he must take many flights of stairs for work at baseline, also must negotiate steps in the community  if homeless)     Home Equipment: None      Prior Function Prior Level of Function : Independent/Modified Independent;Driving;Working/employed             Mobility Comments: pt reports working for an [employer], later reports he lost his job  due to being on dialysis, unsure if pt is working currently       Journalist, newspaper        Extremity/Trunk Assessment   Upper Extremity Assessment Upper Extremity Assessment: Generalized weakness    Lower Extremity Assessment Lower Extremity Assessment: Generalized weakness    Cervical / Trunk Assessment Cervical / Trunk Assessment: Normal  Communication   Communication: HOH  Cognition Arousal/Alertness: Awake/alert Behavior During Therapy: WFL for tasks assessed/performed Overall Cognitive Status: Within Functional Limits for tasks assessed                                          General Comments General comments (skin integrity, edema, etc.): VSS on RA    Exercises     Assessment/Plan    PT Assessment Patient needs continued PT services  PT Problem List Decreased strength;Decreased activity tolerance;Decreased balance;Decreased mobility;Pain       PT Treatment Interventions DME instruction;Gait training;Stair training;Functional mobility training;Therapeutic exercise;Balance training;Neuromuscular re-education;Patient/family education    PT Goals (Current goals can be found in the Care Plan section)  Acute Rehab PT Goals Patient Stated Goal: to improve strength and return to baseline PT Goal Formulation: With patient Time For Goal Achievement: 01/17/22 Potential to Achieve Goals: Good Additional Goals Additional Goal #1: pt will score >19/24 on the DGI to indicate a reduced risk for falls Additional Goal #2: pt will socre >45/56 on the BERG balance test to indicate a reduced risk for falls    Frequency Min 2X/week     Co-evaluation               AM-PAC PT "6 Clicks" Mobility  Outcome Measure Help needed turning from your back to your side while in a flat bed without using bedrails?: None Help needed moving from lying on your back to sitting on the side of a flat bed without using bedrails?: None Help needed moving to and from a bed  to a chair (including a wheelchair)?: None Help needed standing up from a chair using your arms (e.g., wheelchair or bedside chair)?: None Help needed to walk in hospital room?: A Little Help needed climbing 3-5 steps with a railing? : A Little 6 Click Score: 22    End of Session   Activity Tolerance: Patient tolerated treatment well Patient left: in bed;with call bell/phone within reach Nurse Communication: Mobility status PT Visit Diagnosis: Other abnormalities of gait and mobility (R26.89);Muscle weakness (generalized) (M62.81)    Time: 1572-6203 PT Time Calculation (min) (ACUTE ONLY): 17 min   Charges:   PT Evaluation $PT Eval Low Complexity: [place], PT, DPT Acute Rehabilitation Office [phone]   Zenaida Niece 01/03/2022, 3:25 PM

## 2022-01-03 NOTE — Plan of Care (Signed)

## 2022-01-03 NOTE — Progress Notes (Signed)
Daily Progress Note Intern Pager: [phone]  Patient name: [name] Medical record number: [id number] Date of birth: 10/25/1985 Age: 36 y.o. Gender: male  Primary Care Provider: [name], DO Consultants: Nephrology  Code Status: Full   Pt Overview and Major Events to Date:  -Admitted to [facility] 8/23  Assessment and Plan:  [name] is a 36 y.o. male presenting for generalized weakness, leg swelling, and no dialysis in over 1 month. Past medical history is significant for ESRD, Alport syndrome with associated hearing loss, Hypertension, GERD, bloody diarrhea, bipolar 1 disorder, and food/housing insecurity.   * ESRD (end stage renal disease) ([place]) Has been unable to get dialysis in 1 month.  Urine creatinine on admission 21.18.  Electrolytes within normal limits. - Nephrology consulted, following - Continue phosphate binder, phosphate 8.2 on 8/25 - Continue BMPs to monitor electrolytes  Hypertension He is reportedly taking his outpatient medicine 10 mg Norvasc. BP continues to be elevated, but improved after HD.HTN likely multifactorial and related to volume overload. - Continue amlodipine inpatient - Monitor BP after HD, if no improvement after several sessions HD consider a second agent  Bloody diarrhea Patient reports multiple episodes of bloody diarrhea that he is unable to control.  Reports has been ongoing for 3 to 4 months.  He saw GI previously and they did a colonoscopy that showed a few small mouth diverticula in the sigmoid and benign rectal bleeding, otherwise normal. Symptoms thought to be related to IBS. Patient's symptoms were well controlled on 10 mg dicyclomine TID.  - restart 10 mg dicyclomine TID for IBS-D - Hemoglobin 8.7 in the ED; also likely in the setting of ESRD - Transfuse if less than 7 - Consider GI consult if worsening anemia  Hearing loss From his Alport syndrome. Patient does not have his hearing aids with him in the  ED.  Leg pain, bilateral Chronic, ongoing even when patient is regularly getting dialysis.  It is gotten worse in the hospital since the patient is getting dialysis regularly.  Suspect that this may improve with continued dialysis sessions and cramping is related to hypocalcemia and hyperuremia.   -  vitamin B12 level pending  - ambulate patient today to check for tolerance  - consult to PT for recommendations   Food insecurity Reports that he has not been able to eat and days.  This is also been affecting his desire to attend HD as he feels sick with dialysis if on an empty stomach. -Consult TOC  Housing insecurity Reports being kicked out of his house by his spouse. They are in the process of going through divorce.  He recently got a flat tire and is transportation has been limited, affecting his ability to go to HD. -TOC consult for housing resources  GERD (gastroesophageal reflux disease) Patient reports history of belching and hiccups. - Start 40 mg p.o. Protonix  Bipolar 1 disorder ([place]) He has not been prescribed any medication for his bipolar disease. - He appears to be doing well - Consider outpatient follow-up with psych   FEN/GI: Renal diet PPx: Subcu heparin Dispo:Home pending clinical improvement . Barriers include ongoing HD.   Subjective:  Patient resting comfortably. No acute events overnight. Continues to have bilateral leg pain and cramping. Tylenol and Flexeril does not help. Concerns for multiple episodes of diarrhea.   Objective: Temp:  [97.9 F (36.6 C)-98.7 F (37.1 C)] 98.7 F (37.1 C) (08/25 0928) Pulse Rate:  [83-105] 83 (08/25 0928) Resp:  [  15-20] 17 (08/25 0928) BP: (144-161)/(84-102) 144/87 (08/25 0928) SpO2:  [99 %-100 %] 100 % (08/25 0928) Weight:  [69.9 kg-71 kg] 69.9 kg (08/25 0523) Physical Exam: General: chronically ill, no acute distress  Cardiovascular: regular rate and rhythm, no murmurs  Respiratory: clear, no crackles, no wheezing   Abdomen: soft, non-tender  Extremities: no peripheral edema, no signs of DVT  Laboratory: Most recent CBC Lab Results  Component Value Date   WBC 4.6 01/01/2022   HGB 8.7 (L) 01/01/2022   HCT 27.1 (L) 01/01/2022   MCV 94.4 01/01/2022   PLT 158 01/01/2022   Most recent BMP    Latest Ref Rng & Units 01/02/2022    9:29 AM  BMP  Glucose 70 - 99 mg/dL 132   BUN 6 - 20 mg/dL 96   Creatinine 0.61 - 1.24 mg/dL 15.64   Sodium 135 - 145 mmol/L 138   Potassium 3.5 - 5.1 mmol/L 3.6   Chloride 98 - 111 mmol/L 105   CO2 22 - 32 mmol/L 20   Calcium 8.9 - 10.3 mg/dL 7.8     Darci Current, DO 01/03/2022, 11:18 AM  PGY-1, Advance Intern pager: [phone], text pages welcome Secure chat group [place]

## 2022-01-03 NOTE — Assessment & Plan Note (Addendum)
Chronic, ongoing even when patient is regularly getting dialysis.  It is gotten worse in the hospital since the patient is getting dialysis regularly.  Suspect that this may improve with continued dialysis sessions and cramping is related to hypocalcemia and hyperuremia.   -  vitamin B12 level pending  - ambulate patient today to check for tolerance  - consult to PT for recommendations - consider adding baclofen PRN for muscle cramps

## 2022-01-03 NOTE — Progress Notes (Signed)
Met with pt at bedside. Discussed pt's recent history of missing out-pt HD. Pt states he needs an earlier appt so he can take care of personal affairs after HD is completed. Pt's schedule prior to admission was [facility] on Tuesday/Saturday 12:00 arrival for 12:20 chair time. Contacted [employer] and spoke to [place], Quarry manager. Clinic is able to give pt a 6:30 am arrival for 6:50 am chair time. Clinic states that pt cannot miss any treatments. If pt misses any treatments, pt's time will be changed back to later appt time. Clinic manger willing to meet with pt at clinic once pt returns to discuss pt's current needs and expectations. Met with pt at bedside regarding clinic's response (earlier appt time and pt must be compliant). Pt agreeable to 6:30 arrival for 6:50 chair time. Advised pt that he must be compliant for every appt or pt's time will be changed back. Pt agreeable to time and plan. Pt aware that if he d/c this weekend, then he will need to be at clinic on Tuesday at 6:30 am. Will add arrangements to pt's AVS. Clinic can start pt at earlier time on Tuesday. Update provided to pt's RN, RN CM, and nephrologist/renal PA. Will assist as needed.   Melven Sartorius Renal Navigator [phone]

## 2022-01-03 NOTE — TOC Initial Note (Signed)
Transition of Care [facility]) - Initial/Assessment Note    Patient Details  Name: [name] MRN: [id number] Date of Birth: [birth year]  [facility]) CM/SW Contact:    Tom-Johnson, Renea Ee, RN Phone Number: 01/03/2022, 4:18 PM  Clinical Narrative:                  CM spoke with patient at bedside about needs for post hospital transition. Admitted for ESRD. Patient is hard of hearing. Patient states he is not homeless. States he is married but his wife throws him out of the house when she gets frustrated. States he lives in his car and takes showers at his friend's house and also showers at his mother in-law's house. States he has an aunt but will not stay at her house because she has too many rules as he is a grown man. Patient states his car's tire is flat at this time but has a friend that will fix it at discharge. States he is employed through a [employer], [employer], does HVAC and Maintenance work.  Patient declines staying with his aunt, declines using Medicaid transportation for dialysis. Transportation, shelter and food pantry program resources given to patient at bedside and he states he does not need them. Accepted resources but states he will not call for assistance.   No PT f/u noted. CM will continue to follow with needs as patient progresses with care.   Expected Discharge Plan: Home/Self Care Barriers to Discharge: Continued Medical Work up   Patient Goals and CMS Choice Patient states their goals for this hospitalization and ongoing recovery are:: To return home CMS Medicare.gov Compare Post Acute Care list provided to:: Patient Choice offered to / list presented to : Patient  Expected Discharge Plan and Services Expected Discharge Plan: Home/Self Care   Discharge Planning Services: CM Consult Post Acute Care Choice: NA Living arrangements for the past 2 months: Homeless                 DME Arranged: N/A DME Agency: NA       HH Arranged: NA [place]  Agency: NA        Prior Living Arrangements/Services Living arrangements for the past 2 months: Homeless Lives with:: Self Patient language and need for interpreter reviewed:: Yes Do you feel safe going back to the place where you live?: Yes      Need for Family Participation in Patient Care: Yes (Comment) Care giver support system in place?: Yes (comment)   Criminal Activity/Legal Involvement Pertinent to Current Situation/Hospitalization: No - Comment as needed  Activities of Daily Living   ADL Screening (condition at time of admission) Patient's cognitive ability adequate to safely complete daily activities?: Yes Is the patient deaf or have difficulty hearing?: Yes Does the patient have difficulty seeing, even when wearing glasses/contacts?: No Does the patient have difficulty concentrating, remembering, or making decisions?: No Patient able to express need for assistance with ADLs?: No Does the patient have difficulty dressing or bathing?: No Independently performs ADLs?: Yes (appropriate for developmental age) Does the patient have difficulty walking or climbing stairs?: No Weakness of Legs: None Weakness of Arms/Hands: None  Permission Sought/Granted Permission sought to share information with : Case Manager, Family Supports Permission granted to share information with : Yes, Verbal Permission Granted              Emotional Assessment Appearance:: Appears stated age Attitude/Demeanor/Rapport: Engaged, Gracious Affect (typically observed): Accepting, Appropriate, Calm, Hopeful, Pleasant Orientation: : Oriented  to Self, Oriented to Place, Oriented to  Time, Oriented to Situation Alcohol / Substance Use: Not Applicable Psych Involvement: No (comment)  Admission diagnosis:  ESRD (end stage renal disease) ([place]) [N18.6] Non-compliance with renal dialysis [Z91.158] Patient Active Problem List   Diagnosis Date Noted   Leg pain, bilateral 01/03/2022   Bilateral calf  pain    Housing insecurity 01/01/2022   Food insecurity 01/01/2022   Non-compliance with renal dialysis    Hearing loss 10/08/2021   Bloody diarrhea 06/10/2021   Family history of diabetes mellitus 02/11/2021   Hypoglycemic syndrome 02/11/2021   Rash and nonspecific skin eruption 12/16/2019   Rash on scrotum 12/16/2019   Hypertension 06/07/2018   ESRD (end stage renal disease) ([place]) 06/07/2018   Alport syndrome 07/14/2017   GERD (gastroesophageal reflux disease) 07/14/2017   History of appendectomy 07/14/2017   Obesity (BMI 30.0-34.9) 07/14/2017   Pre-transplant evaluation for kidney transplant 07/14/2017   Secondary hyperparathyroidism ([place]) 07/14/2017   Tobacco use 07/14/2017   Bipolar 1 disorder ([place]) 09/01/2013   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

## 2022-01-04 ENCOUNTER — Inpatient Hospital Stay (HOSPITAL_COMMUNITY): Payer: Medicare Other

## 2022-01-04 DIAGNOSIS — I739 Peripheral vascular disease, unspecified: Secondary | ICD-10-CM | POA: Diagnosis not present

## 2022-01-04 DIAGNOSIS — N186 End stage renal disease: Secondary | ICD-10-CM | POA: Diagnosis not present

## 2022-01-04 LAB — RENAL FUNCTION PANEL
Albumin: 3.2 g/dL — ABNORMAL LOW (ref 3.5–5.0)
Anion gap: 13 (ref 5–15)
BUN: 78 mg/dL — ABNORMAL HIGH (ref 6–20)
CO2: 23 mmol/L (ref 22–32)
Calcium: 8.2 mg/dL — ABNORMAL LOW (ref 8.9–10.3)
Chloride: 100 mmol/L (ref 98–111)
Creatinine, Ser: 13.56 mg/dL — ABNORMAL HIGH (ref 0.61–1.24)
GFR, Estimated: 4 mL/min — ABNORMAL LOW (ref >60)
Glucose, Bld: 88 mg/dL (ref 70–99)
Phosphorus: 7.8 mg/dL — ABNORMAL HIGH (ref 2.5–4.6)
Potassium: 3.7 mmol/L (ref 3.5–5.1)
Sodium: 136 mmol/L (ref 135–145)

## 2022-01-04 LAB — VITAMIN B12: Vitamin B-12: 299 pg/mL (ref 180–914)

## 2022-01-04 MED ORDER — NITROGLYCERIN 0.4 MG SL SUBL
SUBLINGUAL_TABLET | SUBLINGUAL | Status: AC
  Filled 2022-01-04: qty 1

## 2022-01-04 MED ORDER — NITROGLYCERIN 0.4 MG SL SUBL: 0.4000 mg | SUBLINGUAL_TABLET | SUBLINGUAL | Status: DC | PRN

## 2022-01-04 MED ORDER — HEPARIN SODIUM (PORCINE) 1000 UNIT/ML IJ SOLN
INTRAMUSCULAR | Status: AC
  Administered 2022-01-04: 1200 [IU]
  Filled 2022-01-04: qty 2

## 2022-01-04 MED ORDER — DICYCLOMINE HCL 10 MG PO CAPS: 10.0000 mg | ORAL_CAPSULE | Freq: Three times a day (TID) | ORAL | 5 refills | Status: DC

## 2022-01-04 MED ORDER — CALCIUM ACETATE (PHOS BINDER) 667 MG PO CAPS: 667.0000 mg | ORAL_CAPSULE | Freq: Three times a day (TID) | ORAL | 0 refills | Status: DC

## 2022-01-04 NOTE — Progress Notes (Signed)
Pt still having severe leg cramps. RN gave PRN Tylenol this am. Pt did not want heating pad at this time. RN will continue to monitor pt.   [name] Cas Tracz

## 2022-01-04 NOTE — Discharge Instructions (Addendum)
Dear [name],  It was a pleasure to take care of you during your stay at  [facility]  where you were treated for your ESRD (end stage renal disease) ([place]).  While you were here, you were:  observed and cared for by our nurses and nursing assistants  treated with medicines / procedures by your doctors  provided resources by our social workers and case managers  treated with hemodialysis by our kidney doctors  You should also follow-up with your primary care doctor as well as with your kidney doctor, and make sure to keep going to your hemodialysis sessions so you don't end up back in the hospital.  Take care!  Camelia Phenes, MD [facility] (Physician) 01/04/2022 7:36 AM

## 2022-01-04 NOTE — Progress Notes (Signed)
Patient discharged per order. Left hand IV removed. Patient tolerated well. Discharge instruction provided and questions answered.

## 2022-01-04 NOTE — Progress Notes (Signed)
OT Cancellation Note  Patient Details Name: [name] MRN: [id number] DOB: [birth year]   Cancelled Treatment:    Reason Eval/Treat Not Completed: Patient at procedure or test/ unavailable.  Continue efforts as schedule allows.    Silvana Holecek D Roran Wegner 01/04/2022, 9:37 AM 01/04/2022  RP, OTR/L  Acute Rehabilitation Services  Office:  [phone]

## 2022-01-04 NOTE — Progress Notes (Addendum)
Pt complaining of chest pain 7/10- sharp pain post dialysis. BP 160/69.  5 minutes after taking Nitro pt stated chest pain was a little better 5/10 - sharp pain resolved but pt stated it felt like his chest was "heavy". PA Zeyfang notified and came to bedside. Orders received for Nitro sublingual and stat EKG.

## 2022-01-04 NOTE — Progress Notes (Addendum)
Daily Progress Note Intern Pager: [phone]  Patient name: [name] Medical record number: [id number] Date of birth: [birth year] Age: 36 y.o. Gender: male  Primary Care Provider: [name], DO Consultants: nephrology Code Status: FULL  Pt Overview and Major Events to Date:  -Admitted to [facility] 8/23  Assessment and Plan: [name] is a 36 y.o. male presenting for generalized weakness and leg swelling secondary to missing dialysis treatment for > 1 month. Past medical history is significant for ESRD, Alport syndrome with associated hearing loss, Hypertension, GERD, bloody diarrhea, bipolar 1 disorder, and food/housing insecurity.   * ESRD (end stage renal disease) ([place]) Has been unable to get dialysis in 1 month.  Urine creatinine on admission 21.18.  Electrolytes within normal limits. - Nephrology consulted, following - Continue phosphate binder, phosphate 8.2 on 8/25 - Continue BMPs to monitor electrolytes  Hypertension He is reportedly taking his outpatient medicine 10 mg Norvasc. BP continues to be elevated, but improved after HD.HTN likely multifactorial and related to volume overload. - Continue amlodipine inpatient - Monitor BP after HD, if no improvement after several sessions HD consider a second agent  Bloody diarrhea Patient reports multiple episodes of bloody diarrhea that he is unable to control.  Reports has been ongoing for 3 to 4 months.  He saw GI previously and they did a colonoscopy that showed a few small mouth diverticula in the sigmoid and benign rectal bleeding, otherwise normal. Symptoms thought to be related to IBS. Patient's symptoms were well controlled on 10 mg dicyclomine TID.  - restart 10 mg dicyclomine TID for IBS-D - Hemoglobin 8.7 in the ED; also likely in the setting of ESRD - Transfuse if less than 7 - Consider GI consult if worsening anemia  Hearing loss From his Alport syndrome. Patient does not have his hearing aids with  him in the ED.  Leg pain, bilateral Chronic, ongoing even when patient is regularly getting dialysis.  It is gotten worse in the hospital since the patient is getting dialysis regularly.  Suspect that this may improve with continued dialysis sessions and cramping is related to hypocalcemia and hyperuremia.   -  vitamin B12 level pending  - ambulate patient today to check for tolerance  - consult to PT for recommendations - consider adding baclofen PRN for muscle cramps  Food insecurity Reports that he has not been able to eat and days.  This is also been affecting his desire to attend HD as he feels sick with dialysis if on an empty stomach. -Consult TOC  Housing insecurity Reports being kicked out of his house by his spouse. They are in the process of going through divorce.  He recently got a flat tire and is transportation has been limited, affecting his ability to go to HD. -TOC consult for housing resources  GERD (gastroesophageal reflux disease) Patient reports history of belching and hiccups. - Start 40 mg p.o. Protonix  Bipolar 1 disorder ([place]) He has not been prescribed any medication for his bipolar disease. - He appears to be doing well - Consider outpatient follow-up with psych   FEN/GI: renal diet PPx: subcutaneous heparin Dispo:Home pending clinical improvement . Barriers include ongoing HD treatments.   Subjective:  Patient continues to endorse pain behind his leg, though states it is significantly better compared to yesterday. Tells me he eats mustard and it helps with his leg cramps.  Objective: Temp:  [97.9 F (36.6 C)-98.7 F (37.1 C)] 97.9 F (36.6 C) (08/26  1191) Pulse Rate:  [73-83] 73 (08/26 0446) Resp:  [17-18] 18 (08/26 0446) BP: (141-157)/(80-97) 157/97 (08/26 0446) SpO2:  [99 %-100 %] 99 % (08/25 1700) Physical Exam: General: well-appearing, in no acute distress, and hard of hearing HEENT: normocephalic and atraumatic Cardiovascular: regular  rate Respiratory: CTAB anteriorly, normal respiratory effort, and on RA Gastrointestinal: non-tender and non-distended Extremities: moving all extremities spontaneously Neuro: following commands and no focal neurological deficits  Laboratory: Most recent CBC Lab Results  Component Value Date   WBC 4.6 01/01/2022   HGB 8.7 (L) 01/01/2022   HCT 27.1 (L) 01/01/2022   MCV 94.4 01/01/2022   PLT 158 01/01/2022   Most recent BMP    Latest Ref Rng & Units 01/04/2022    5:27 AM  BMP  Glucose 70 - 99 mg/dL 88   BUN 6 - 20 mg/dL 78   Creatinine 0.61 - 1.24 mg/dL 13.56   Sodium 135 - 145 mmol/L 136   Potassium 3.5 - 5.1 mmol/L 3.7   Chloride 98 - 111 mmol/L 100   CO2 22 - 32 mmol/L 23   Calcium 8.9 - 10.3 mg/dL 8.2    Imaging/Diagnostic Tests: No new imaging to report  Camelia Phenes, MD 01/04/2022, 7:30 AM  PGY-1, [place] Intern pager: [phone], text pages welcome Secure chat group [place]

## 2022-01-04 NOTE — Progress Notes (Signed)
Subjective:  awoken  from sleep , no co currently  for HD today . Denies calf pain now. WOULD NOT GIVE BACLOFEN TO Any ESRD PTS  2/2 AMS in ESRD pts  with it Contraindicated .Currently Ok for dc by Nephrology if stable    Objective Vital signs in last 24 hours: Vitals:   01/03/22 0535 01/03/22 0928 01/03/22 1700 01/04/22 0446  BP: (!) 161/91 (!) 144/87 (!) 141/80 (!) 157/97  Pulse: 88 83 81 73  Resp: 17 17 18 18   Temp: 97.9 F (36.6 C) 98.7 F (37.1 C) 98.5 F (36.9 C) 97.9 F (36.6 C)  TempSrc: Oral Oral    SpO2: 100% 100% 99%   Weight:      Height:       Weight change:   Physical Exam: General: Alert young male NAD, hard of hearing chronic Heart: RRR no MRG Lungs: CTA bilaterally nonlabored breathing Abdomen: NABS, soft, NT, ND no ascites Extremities: Pedal edema Dialysis Access: Right UE AV fistula positive bruit     Dialysis Orders: [facility] 4h 400/600   74.4kg  2/2 bath P2  Heparin 1200  RUA AVF - last Hb 6.9 on 7/27, last tsat 53% - last HD 8/1, post wt was 74.4kg - mircera 100 q 2, last 8/1, due 8/15 - calcitriol 1.25 ug tiw po     Problem/Plan: Uremia/azotemia= Cr improved with admit missed 3 weeks of HD treatment with very poor HD compliance overall, had serial HD's during this admission, SW helping with transportation and scheduling at [place] , Sat  schedule 630 am ESRD  in hospital TTS, outpatient will be Tues Saturdays 4 hours HTN/volume -BP mildly elevated on admission improved with amlodipine and UF with HD but was outpatient EDW still makes urine plan for HD MR Anemia -Hgb 8.7, has missed outpatient ESA, started Aranesp 150 last given 8/24 Secondary hyperparathyroidism -corrected calcium okay, phosphorus elevated with noncompliance outpatient binder start binders in hospital check PTH currently on calcitriol q. dialysis HOH= history of Alport's Calf pain= Wu per admit .WOULD NOT GIVE BACLOFEN TO Any ESRD PTS  2/2 AMS in ESRD pts  with it  Contraindicated .  Ernest Haber, PA-C [facility]per [phone] 01/04/2022,7:58 AM  LOS: 2 days   Labs: Basic Metabolic Panel: Recent Labs  Lab 01/01/22 0124 01/01/22 1130 01/02/22 0929 01/04/22 0527  NA 137  --  138 136  K 5.0  --  3.6 3.7  CL 112*  --  105 100  CO2 11*  --  20* 23  GLUCOSE 90  --  132* 88  BUN 134*  --  96* 78*  CREATININE 21.18*  --  15.64* 13.56*  CALCIUM 8.7*  --  7.8* 8.2*  PHOS  --  8.9* 8.2* 7.8*   Liver Function Tests: Recent Labs  Lab 01/01/22 0124 01/02/22 0929 01/04/22 0527  AST 19  --   --   ALT 43  --   --   ALKPHOS 76  --   --   BILITOT 0.4  --   --   PROT 6.3*  --   --   ALBUMIN 3.9 3.3* 3.2*   No results for input(s): "LIPASE", "AMYLASE" in the last 168 hours. No results for input(s): "AMMONIA" in the last 168 hours. CBC: Recent Labs  Lab 01/01/22 0124  WBC 4.6  NEUTROABS 2.7  HGB 8.7*  HCT 27.1*  MCV 94.4  PLT 158   Cardiac Enzymes: No results for  input(s): "CKTOTAL", "CKMB", "CKMBINDEX", "TROPONINI" in the last 168 hours. CBG: No results for input(s): "GLUCAP" in the last 168 hours.  Studies/Results: No results found. Medications:   amLODipine  10 mg Oral Daily   calcitRIOL  0.25 mcg Oral Daily   calcium acetate  667 mg Oral TID WC   Chlorhexidine Gluconate Cloth  6 each Topical Q0600   Chlorhexidine Gluconate Cloth  6 each Topical Q0600   Chlorhexidine Gluconate Cloth  6 each Topical Q0600   [START ON 01/07/2022] darbepoetin (ARANESP) injection - DIALYSIS  150 mcg Intravenous Q Tue-HD   dicyclomine  10 mg Oral TID AC   heparin  5,000 Units Subcutaneous Q8H   heparin sodium (porcine)       multivitamin  1 tablet Oral QHS

## 2022-01-04 NOTE — Progress Notes (Signed)
ABI/TBI study completed. Please see CV Proc for preliminary results.  Anderson Malta  Macee Venables BS, RVT 01/04/2022 3:57 PM

## 2022-01-04 NOTE — Discharge Summary (Signed)
[facility] Discharge Summary  Patient name: [facility] record number: [id number] Date of birth: October 31, 1985 Age: 36 y.o. Gender: male Date of Admission: 01/01/2022  Date of Discharge: 01/04/2022  Admitting Physician: Zenia Resides, MD  Primary Care Provider: [name], DO Consultants: Nephrology  Indication for Hospitalization: ESRD, missed dialysis for 1 month  Brief Hospital Course:  JARAN SAINZ is a 36 y.o. male presenting for generalized weakness, leg swelling, and no dialysis in over 1 month. Past medical history is significant for ESRD, Alport syndrome with associated hearing loss, Hypertension, GERD, bloody diarrhea, bipolar 1 disorder, and food/housing insecurity.  His hospital course outlined below:  ESRD: Prior to admission, patient had been unable to get dialysis in over a month.  Urine creatinine on admission was 21.18.  Nephrology was consulted and recommended serial HD sessions.  His electrolytes were normal and on admission and remained stable throughout dialysis.  His phosphate on admission was 8.9.  He was started on a phosphate binder and his phosphate down trended and remained stable.  His discharge weight was 70.5kg.  Bilateral leg pain: On presentation patient endorsed bilateral leg pain and cramping that was affecting his ambulation.  He received multiple doses of Tylenol and Flexeril with no relief of symptoms.  We supect that this was due to uremia and hypocalcemia.  Due to reports of what sounded like claudication and his multiple risk factors for PAD, ABIs were obtained which were within normal limits.    Follow-up outpatient:  The primary driver behind this patient's presentation seems to be social. Please ensure that he has the resources necessary to attend all of his scheduled dialysis sessions moving forward. His outpatient dialysis center has kindly agreed to move him to an earlier chair time to  accommodate his schedule.  Discharge Diagnoses/Problem List:  ESRD (end stage renal disease) ([place]) Hypertension Bloody diarrhea Hearing loss Leg pain, bilateral Food and housing insecurity Bipolar 1 disorder ([place])  Disposition: Home  Discharge Condition: stable, at baseline  Discharge Exam: BP (!) 150/88 (BP Location: Left Arm)   Pulse 89   Temp 98.5 F (36.9 C) (Oral)   Resp 18   Ht 5\' 8"  (1.727 m)   Wt 70.5 kg   SpO2 98%   BMI 23.63 kg/m  From Dr. Althea Charon same day progress note: General: well-appearing, in no acute distress, and hard of hearing HEENT: normocephalic and atraumatic Cardiovascular: regular rate Respiratory: CTAB anteriorly, normal respiratory effort, and on RA Gastrointestinal: non-tender and non-distended Extremities: moving all extremities spontaneously Neuro: following commands and no focal neurological deficits  Significant Procedures: none this admission  Significant Labs and Imaging:  No results for input(s): "WBC", "HGB", "HCT", "PLT" in the last 48 hours. Recent Labs  Lab 01/04/22 0527  NA 136  K 3.7  CL 100  CO2 23  GLUCOSE 88  BUN 78*  CREATININE 13.56*  CALCIUM 8.2*  PHOS 7.8*  ALBUMIN 3.2*    Relevant Images / Tests this Admission: CXR (8/23): IMPRESSION: No active cardiopulmonary disease.  ABI 8/26 Right: Resting right ankle-brachial index indicates noncompressible right  lower extremity arteries. The right toe-brachial index is normal.   Left: Resting left ankle-brachial index indicates noncompressible left  lower extremity arteries. The left toe-brachial index is normal.  Results/Tests Pending at Time of Discharge: none  Discharge Medications:  Allergies as of 01/04/2022       Reactions   Zestril [lisinopril] Swelling   Nsaids Other (See Comments)   "  Kidney problems "   Risperdal [risperidone] Other (See Comments)   Unknown reaction         Medication List     STOP taking these medications     metoCLOPramide 5 MG tablet Commonly known as: Reglan   pantoprazole 40 MG tablet Commonly known as: PROTONIX       TAKE these medications    acetaminophen 500 MG tablet Commonly known as: TYLENOL Take 500 mg by mouth every 6 (six) hours as needed for mild pain or moderate pain.   amLODipine 10 MG tablet Commonly known as: NORVASC Take 1 tablet (10 mg total) by mouth daily.   calcitRIOL 0.25 MCG capsule Commonly known as: ROCALTROL Take 0.25 mcg by mouth daily.   calcium acetate 667 MG capsule Commonly known as: PHOSLO Take 1 capsule (667 mg total) by mouth 3 (three) times daily with meals.   dicyclomine 10 MG capsule Commonly known as: BENTYL Take 1 capsule (10 mg total) by mouth in the morning, at noon, and at bedtime.   Tums Ultra 1000 400 MG chewable tablet Generic drug: calcium elemental as carbonate Chew 1,000 mg by mouth daily as needed for heartburn.        Discharge Instructions: Please refer to Patient Instructions section of EMR for full details.  Patient was counseled important signs and symptoms that should prompt return to medical care, changes in medications, dietary instructions, activity restrictions, and follow up appointments.   Follow-Up Appointments:  [facility] Go on 01/07/2022.   Why: Schedule is Tuesday and Saturday. Arrive at 6:30 am for 6:50 am chair time.  Pt MUST attend all out-pt HD appointments. Contact information: [place] [address] [phone]                 Camelia Phenes, MD 01/04/2022, 7:26 AM PGY-1, [place]   I have evaluated this patient along with Dr. Edd Arbour and reviewed the above note, making necessary revisions.  Pearla Dubonnet, MD 01/04/2022, 5:38 PM PGY-2, [place]

## 2022-01-04 NOTE — Progress Notes (Signed)
Addendum today =notified by HD RN at end of dialysis treatment patient having chest pain 7 out of 10 .BP stable no shortness of breath, was given sublingual nitroglycerin, EKG obtained no significant acute changes .minimal discomfort with sternal rub to chest, states chest pain may be getting better.  His blood pressure did not drop on HD.  Further work-up by admit team   Ernest Haber, PA-C [facility] [phone] 01/04/2022,1:31 PM  LOS: 2 days   Labs: Basic Metabolic Panel: Recent Labs  Lab 01/01/22 0124 01/01/22 1130 01/02/22 0929 01/04/22 0527  NA 137  --  138 136  K 5.0  --  3.6 3.7  CL 112*  --  105 100  CO2 11*  --  20* 23  GLUCOSE 90  --  132* 88  BUN 134*  --  96* 78*  CREATININE 21.18*  --  15.64* 13.56*  CALCIUM 8.7*  --  7.8* 8.2*  PHOS  --  8.9* 8.2* 7.8*   Liver Function Tests: Recent Labs  Lab 01/01/22 0124 01/02/22 0929 01/04/22 0527  AST 19  --   --   ALT 43  --   --   ALKPHOS 76  --   --   BILITOT 0.4  --   --   PROT 6.3*  --   --   ALBUMIN 3.9 3.3* 3.2*   No results for input(s): "LIPASE", "AMYLASE" in the last 168 hours. No results for input(s): "AMMONIA" in the last 168 hours. CBC: Recent Labs  Lab 01/01/22 0124  WBC 4.6  NEUTROABS 2.7  HGB 8.7*  HCT 27.1*  MCV 94.4  PLT 158   Cardiac Enzymes: No results for input(s): "CKTOTAL", "CKMB", "CKMBINDEX", "TROPONINI" in the last 168 hours. CBG: No results for input(s): "GLUCAP" in the last 168 hours.  Studies/Results: No results found. Medications:   amLODipine  10 mg Oral Daily   calcitRIOL  0.25 mcg Oral Daily   calcium acetate  667 mg Oral TID WC   Chlorhexidine Gluconate Cloth  6 each Topical Q0600   Chlorhexidine Gluconate Cloth  6 each Topical Q0600   Chlorhexidine Gluconate Cloth  6 each Topical Q0600   [START ON 01/07/2022] darbepoetin (ARANESP) injection - DIALYSIS  150 mcg Intravenous Q Tue-HD   dicyclomine  10 mg Oral TID AC   heparin  5,000 Units  Subcutaneous Q8H   multivitamin  1 tablet Oral QHS

## 2022-01-05 ENCOUNTER — Other Ambulatory Visit: Payer: Self-pay

## 2022-01-05 ENCOUNTER — Emergency Department (HOSPITAL_COMMUNITY)
Admission: EM | Admit: 2022-01-05 | Discharge: 2022-01-05 | Disposition: A | Discharge status: Home / Self Care | Payer: Medicare Other | Attending: Emergency Medicine | Admitting: Emergency Medicine

## 2022-01-05 ENCOUNTER — Emergency Department (HOSPITAL_COMMUNITY): Payer: Medicare Other

## 2022-01-05 ENCOUNTER — Encounter (HOSPITAL_COMMUNITY): Payer: Self-pay

## 2022-01-05 DIAGNOSIS — M79605 Pain in left leg: Secondary | ICD-10-CM | POA: Insufficient documentation

## 2022-01-05 DIAGNOSIS — R079 Chest pain, unspecified: Secondary | ICD-10-CM | POA: Insufficient documentation

## 2022-01-05 DIAGNOSIS — M79604 Pain in right leg: Secondary | ICD-10-CM | POA: Insufficient documentation

## 2022-01-05 DIAGNOSIS — R109 Unspecified abdominal pain: Secondary | ICD-10-CM | POA: Insufficient documentation

## 2022-01-05 DIAGNOSIS — N186 End stage renal disease: Secondary | ICD-10-CM | POA: Insufficient documentation

## 2022-01-05 DIAGNOSIS — Z79899 Other long term (current) drug therapy: Secondary | ICD-10-CM | POA: Insufficient documentation

## 2022-01-05 DIAGNOSIS — M791 Myalgia, unspecified site: Secondary | ICD-10-CM | POA: Insufficient documentation

## 2022-01-05 DIAGNOSIS — Z992 Dependence on renal dialysis: Secondary | ICD-10-CM | POA: Diagnosis not present

## 2022-01-05 DIAGNOSIS — R0602 Shortness of breath: Secondary | ICD-10-CM | POA: Diagnosis not present

## 2022-01-05 DIAGNOSIS — R252 Cramp and spasm: Secondary | ICD-10-CM

## 2022-01-05 LAB — CBC
HCT: 27.3 % — ABNORMAL LOW (ref 39.0–52.0)
Hemoglobin: 9 g/dL — ABNORMAL LOW (ref 13.0–17.0)
MCH: 31.6 pg (ref 26.0–34.0)
MCHC: 33 g/dL (ref 30.0–36.0)
MCV: 95.8 fL (ref 80.0–100.0)
Platelets: 165 10*3/uL (ref 150–400)
RBC: 2.85 MIL/uL — ABNORMAL LOW (ref 4.22–5.81)
RDW: 13.3 % (ref 11.5–15.5)
WBC: 4.4 10*3/uL (ref 4.0–10.5)
nRBC: 0 % (ref 0.0–0.2)

## 2022-01-05 LAB — BASIC METABOLIC PANEL
Anion gap: 14 (ref 5–15)
BUN: 47 mg/dL — ABNORMAL HIGH (ref 6–20)
CO2: 25 mmol/L (ref 22–32)
Calcium: 8.3 mg/dL — ABNORMAL LOW (ref 8.9–10.3)
Chloride: 96 mmol/L — ABNORMAL LOW (ref 98–111)
Creatinine, Ser: 10.26 mg/dL — ABNORMAL HIGH (ref 0.61–1.24)
GFR, Estimated: 6 mL/min — ABNORMAL LOW (ref >60)
Glucose, Bld: 133 mg/dL — ABNORMAL HIGH (ref 70–99)
Potassium: 3.3 mmol/L — ABNORMAL LOW (ref 3.5–5.1)
Sodium: 135 mmol/L (ref 135–145)

## 2022-01-05 LAB — MAGNESIUM: Magnesium: 1.5 mg/dL — ABNORMAL LOW (ref 1.7–2.4)

## 2022-01-05 LAB — BRAIN NATRIURETIC PEPTIDE: B Natriuretic Peptide: 21.3 pg/mL (ref 0.0–100.0)

## 2022-01-05 LAB — TROPONIN I (HIGH SENSITIVITY)
Troponin I (High Sensitivity): 20 ng/L — ABNORMAL HIGH (ref <18)
Troponin I (High Sensitivity): 22 ng/L — ABNORMAL HIGH (ref <18)

## 2022-01-05 MED ORDER — CYCLOBENZAPRINE HCL 10 MG PO TABS
5.0000 mg | ORAL_TABLET | Freq: Once | ORAL | Status: AC
  Administered 2022-01-05: 5 mg via ORAL
  Filled 2022-01-05: qty 1

## 2022-01-05 MED ORDER — POTASSIUM CHLORIDE CRYS ER 20 MEQ PO TBCR
30.0000 meq | EXTENDED_RELEASE_TABLET | Freq: Once | ORAL | Status: AC
  Administered 2022-01-05: 30 meq via ORAL
  Filled 2022-01-05: qty 1

## 2022-01-05 MED ORDER — CYCLOBENZAPRINE HCL 10 MG PO TABS: 10.0000 mg | ORAL_TABLET | Freq: Two times a day (BID) | ORAL | 0 refills | Status: DC | PRN

## 2022-01-05 NOTE — ED Triage Notes (Signed)
Pt arrives via [facility] with c/o chest pain. Pt is deaf. Dialysis yesterday. Worsening chest pain, worse to movement and palpation. Nausea, dry heaves. 4 zofran, iv established in the left AC. 158/88, hr 96 nsr, 99% ra.

## 2022-01-05 NOTE — ED Notes (Signed)
Pt states his whole body is sore and it is hard for him to breathe. Pt goes to HD Tues and Saturday.  Pt states he was d/c yesterday but still has symptoms.

## 2022-01-05 NOTE — ED Provider Notes (Signed)
[facility] EMERGENCY DEPARTMENT Provider Note   CSN: [id number] Arrival date & time: 01/05/22  0137     History  Chief Complaint  Patient presents with   Chest Pain    [name] is a 36 y.o. male.  36 year old male with prior medical history as detailed below presents for evaluation.  Patient complains of significant cramping pain all over.  He reports that this has been an ongoing issue for at least several days.  He complains of crampy pain in his chest, abdomen, and bilateral legs.  Patient DC'd yesterday from this facility after being admitted for missed dialysis sessions.  Per discharge summary, patient with intermittent complaint of muscular cramps during admission which was treated with Flexeril and Tylenol.  The history is provided by the patient and medical records.       Home Medications Prior to Admission medications   Medication Sig Start Date End Date Taking? Authorizing Provider  acetaminophen (TYLENOL) 500 MG tablet Take 500 mg by mouth every 6 (six) hours as needed for mild pain or moderate pain.    [provider]  amLODipine (NORVASC) 10 MG tablet Take 1 tablet (10 mg total) by mouth daily. 02/11/21   Zenia Resides, MD  calcitRIOL (ROCALTROL) 0.25 MCG capsule Take 0.25 mcg by mouth daily.    [provider]  calcium acetate (PHOSLO) 667 MG capsule Take 1 capsule (667 mg total) by mouth 3 (three) times daily with meals. 01/04/22   August Albino, MD  calcium elemental as carbonate (TUMS ULTRA 1000) 400 MG chewable tablet Chew 1,000 mg by mouth daily as needed for heartburn.    [provider]  dicyclomine (BENTYL) 10 MG capsule Take 1 capsule (10 mg total) by mouth in the morning, at noon, and at bedtime. 01/04/22   August Albino, MD      Allergies    Zestril [lisinopril], Nsaids, and Risperdal [risperidone]    Review of Systems   Review of Systems  All other systems reviewed and are  negative.   Physical Exam Updated Vital Signs BP (!) 150/93   Pulse 88   Temp 98 F (36.7 C) (Oral)   Resp 17   SpO2 100%  Physical Exam Vitals and nursing note reviewed.  Constitutional:      General: He is not in acute distress.    Appearance: Normal appearance. He is well-developed.  HENT:     Head: Normocephalic and atraumatic.  Eyes:     Conjunctiva/sclera: Conjunctivae normal.     Pupils: Pupils are equal, round, and reactive to light.  Cardiovascular:     Rate and Rhythm: Normal rate and regular rhythm.     Heart sounds: Normal heart sounds.  Pulmonary:     Effort: Pulmonary effort is normal. No respiratory distress.     Breath sounds: Normal breath sounds.  Abdominal:     General: There is no distension.     Palpations: Abdomen is soft.     Tenderness: There is no abdominal tenderness.  Musculoskeletal:        General: No deformity. Normal range of motion.     Cervical back: Normal range of motion and neck supple.  Skin:    General: Skin is warm and dry.  Neurological:     General: No focal deficit present.     Mental Status: [name] and oriented to person, place, and time.     ED Results / Procedures / Treatments   Labs (all  labs ordered are listed, but only abnormal results are displayed) Labs Reviewed  BASIC METABOLIC PANEL - Abnormal; Notable for the following components:      Result Value   Potassium 3.3 (*)    Chloride 96 (*)    Glucose, Bld 133 (*)    BUN 47 (*)    Creatinine, Ser 10.26 (*)    Calcium 8.3 (*)    GFR, Estimated 6 (*)    All other components within normal limits  CBC - Abnormal; Notable for the following components:   RBC 2.85 (*)    Hemoglobin 9.0 (*)    HCT 27.3 (*)    All other components within normal limits  MAGNESIUM - Abnormal; Notable for the following components:   Magnesium 1.5 (*)    All other components within normal limits  TROPONIN I (HIGH SENSITIVITY) - Abnormal; Notable for the following components:    Troponin I (High Sensitivity) 20 (*)    All other components within normal limits  TROPONIN I (HIGH SENSITIVITY) - Abnormal; Notable for the following components:   Troponin I (High Sensitivity) 22 (*)    All other components within normal limits  BRAIN NATRIURETIC PEPTIDE    EKG EKG Interpretation  Date/Time:  Sunday January 05 2022 01:47:44 EDT Ventricular Rate:  101 PR Interval:  150 QRS Duration: 92 QT Interval:  354 QTC Calculation: 459 R Axis:   60 Text Interpretation: Sinus tachycardia Nonspecific T wave abnormality Abnormal ECG When compared with ECG of 04-Jan-2022 12:52, PREVIOUS ECG IS PRESENT Confirmed by Dene Gentry [phone]) on 01/05/2022 7:16:10 AM  Radiology DG Chest 2 View  Result Date: 01/05/2022 CLINICAL DATA:  Shortness of breath, chest pain EXAM: CHEST - 2 VIEW COMPARISON:  01/01/2022 FINDINGS: Heart and mediastinal contours are within normal limits. No focal opacities or effusions. No acute bony abnormality. IMPRESSION: No active cardiopulmonary disease. Electronically Signed   By: Rolm Baptise M.D.   On: 01/05/2022 02:29   VAS [place] ABI WITH/WO TBI  Result Date: 01/04/2022  LOWER EXTREMITY DOPPLER STUDY Patient Name:  [name]  Date of Exam:   01/04/2022 Medical Rec #: [id number]        Accession #:    [id number] Date of Birth: 1985/09/05        Patient Gender: M Patient Age:   39 years Exam Location:  [facility] Procedure:      VAS [place] ABI WITH/WO TBI Referring Phys: Andrena Mews --------------------------------------------------------------------------------  Indications: Claudication. Cramping in bilateral legs High Risk Factors: Hypertension, Diabetes, past history of smoking.  Performing Technologist: Bobetta Lime BS, RVT  Examination Guidelines: A complete evaluation includes at minimum, Doppler waveform signals and systolic blood pressure reading at the level of bilateral brachial, anterior tibial, and posterior tibial arteries, when vessel segments  are accessible. Bilateral testing is considered an integral part of a complete examination. Photoelectric Plethysmograph (PPG) waveforms and toe systolic pressure readings are included as required and additional duplex testing as needed. Limited examinations for reoccurring indications may be performed as noted.  ABI Findings: +---------+------------------+-----+---------+------------------------------+ Right    Rt Pressure (mmHg)IndexWaveform Comment                        +---------+------------------+-----+---------+------------------------------+ Brachial                                 Pressure not taken due to AVF. +---------+------------------+-----+---------+------------------------------+ PTA  215               1.24 triphasic                               +---------+------------------+-----+---------+------------------------------+ DP       237               1.36 triphasic                               +---------+------------------+-----+---------+------------------------------+ Great Toe179               1.03 Normal                                  +---------+------------------+-----+---------+------------------------------+ +---------+------------------+-----+---------+-------+ Left     Lt Pressure (mmHg)IndexWaveform Comment +---------+------------------+-----+---------+-------+ Brachial 174                    triphasic        +---------+------------------+-----+---------+-------+ PTA      214               1.23 triphasic        +---------+------------------+-----+---------+-------+ DP       227               1.30 triphasic        +---------+------------------+-----+---------+-------+ Great Toe188               1.08 Normal           +---------+------------------+-----+---------+-------+ +-------+-----------+-----------+------------+------------+ ABI/TBIToday's ABIToday's TBIPrevious ABIPrevious TBI  +-------+-----------+-----------+------------+------------+ Right  [place]         1.03                                +-------+-----------+-----------+------------+------------+ Left   [place]         1.08                                +-------+-----------+-----------+------------+------------+  Summary: Right: Resting right ankle-brachial index indicates noncompressible right lower extremity arteries. The right toe-brachial index is normal. Left: Resting left ankle-brachial index indicates noncompressible left lower extremity arteries. The left toe-brachial index is normal. *See table(s) above for measurements and observations.  Electronically signed by Harold Barban MD on 01/04/2022 at 4:17:31 PM.    Final     Procedures Procedures    Medications Ordered in ED Medications  cyclobenzaprine (FLEXERIL) tablet 5 mg (5 mg Oral Given 01/05/22 0746)    ED Course/ Medical Decision Making/ A&P                           Medical Decision Making Risk Prescription drug management.    Medical Screen Complete  This patient presented to the ED with complaint of feeling cramps.  This complaint involves an extensive number of treatment options. The initial differential diagnosis includes, but is not limited to, metabolic abnormality, etc.  This presentation is: Chronic, Self-Limited, Previously Undiagnosed, Uncertain Prognosis, Complicated, Systemic Symptoms, and Threat to Life/Bodily Function  Patient with complaint of diffuse muscular cramps.  Patient is on HD with history of ESRD.  He was discharged yesterday after admission secondary to noncompliance with HD.  Patient was complaining of persistent muscular cramps especially in his legs throughout the course of his admission.  This was treated with Tylenol and Flexeril.  Patient's cramping discomfort was attributed to metabolic abnormalities given his lengthy period of time without dialysis.  Case and labs reviewed with Dr. Jonnie Finner covering  nephrology.  He agrees with plan to administer Flexeril and also to supplement potassium with 30 mill equivalents p.o.  Patient is appropriate for discharge.  Patient does understand need for close outpatient follow-up with outpatient dialysis on Tuesday the 29th.  Co morbidities that complicated the patient's evaluation  ESRD on HD   Additional history obtained:  Additional history obtained from [facility] External records from outside sources obtained and reviewed including prior ED visits and prior Inpatient records.    Lab Tests:  I ordered and personally interpreted labs.  The pertinent results include: CBC, BMP, magnesium, troponin, BNP   Imaging Studies ordered:  I ordered imaging studies including x-ray I independently visualized and interpreted obtained imaging which showed NAD I agree with the radiologist interpretation.   Cardiac Monitoring:  The patient was maintained on a cardiac monitor.  I personally viewed and interpreted the cardiac monitor which showed an underlying rhythm of: NSR   Medicines ordered:  I ordered medication including potassium, Flexeril for muscular cramps Reevaluation of the patient after these medicines showed that the patient: improved    Consultations Obtained:  I consulted Dr. Jonnie Finner, Nephrology,  and discussed lab and imaging findings as well as pertinent plan of care.    Problem List / ED Course:  Muscular cramps   Reevaluation:  After the interventions noted above, I reevaluated the patient and found that they have: improved   Disposition:  After consideration of the diagnostic results and the patients response to treatment, I feel that the patent would benefit from close outpatient followup.          Final Clinical Impression(s) / ED Diagnoses Final diagnoses:  Muscle cramping  ESRD (end stage renal disease) ([place])    Rx / DC Orders ED Discharge Orders     None         Valarie Merino, MD 01/05/22  [phone]

## 2022-01-05 NOTE — ED Notes (Addendum)
Pt is at bedside raising his body abruptly in bed stating his chest, abdomen, and legs hurts from cramps. Pt states it feels like he cannot breathe. Pt vitals O2 100% RR 23 HR 90  EDP placed medication for pt

## 2022-01-05 NOTE — Discharge Instructions (Addendum)
Return for any problem.  Follow-up closely with [facility] on August 29th.  You are scheduled for Tuesday morning dialysis session.  You should arrive at 6:30 AM.

## 2022-01-05 NOTE — ED Notes (Addendum)
Pt has new symptoms of tingling in the hands and face as well as body cramping

## 2022-01-05 NOTE — ED Triage Notes (Signed)
Pt is hard of hearing. Pt says he feels short of breath. Dialysis pt, c/o chest pain and intermittent sharp abdominal pain. Nausea.

## 2022-01-05 NOTE — ED Notes (Signed)
Pt states muscle relaxant worked and he feels some relief.

## 2022-01-06 ENCOUNTER — Telehealth: Payer: Self-pay | Admitting: Physician Assistant

## 2022-01-06 NOTE — Telephone Encounter (Signed)
Transition of care contact from inpatient facility  Date of discharge:  01/04/22 Date of contact: 01/06/22 Method: Phone Spoke to: Patient  Patient contacted to discuss transition of care from recent inpatient hospitalization. Patient was admitted to [facility] with discharge diagnosis of uremia due to missed HD.  Medication changes were reviewed. He reports he is picking up meds from the pharmacy today but he is not sure which ones. Reviewed discharge med list and advised to bring his meds to HD tomorrow for review.  Patient will follow up with his/her outpatient HD unit on: 01/07/22. He states he will be there.  Anice Paganini, PA-C 01/06/2022, 12:57 PM  [employer]

## 2022-01-06 NOTE — Progress Notes (Signed)
Late Note Entry:  Pt was d/c to home this weekend. Contacted  [facility] to advise clinic of pt's d/c and that pt should resume care tomorrow.   Melven Sartorius Renal Navigator [phone]

## 2022-01-19 ENCOUNTER — Other Ambulatory Visit: Payer: Self-pay

## 2022-01-19 ENCOUNTER — Emergency Department (HOSPITAL_COMMUNITY)
Admission: EM | Admit: 2022-01-19 | Discharge: 2022-01-19 | Disposition: A | Discharge status: Home / Self Care | Payer: Medicare Other | Attending: Emergency Medicine | Admitting: Emergency Medicine

## 2022-01-19 ENCOUNTER — Encounter (HOSPITAL_COMMUNITY): Payer: Self-pay | Admitting: Emergency Medicine

## 2022-01-19 DIAGNOSIS — R600 Localized edema: Secondary | ICD-10-CM | POA: Diagnosis not present

## 2022-01-19 DIAGNOSIS — I509 Heart failure, unspecified: Secondary | ICD-10-CM | POA: Diagnosis not present

## 2022-01-19 DIAGNOSIS — I110 Hypertensive heart disease with heart failure: Secondary | ICD-10-CM | POA: Diagnosis not present

## 2022-01-19 DIAGNOSIS — Z79899 Other long term (current) drug therapy: Secondary | ICD-10-CM | POA: Diagnosis not present

## 2022-01-19 DIAGNOSIS — Z992 Dependence on renal dialysis: Secondary | ICD-10-CM | POA: Insufficient documentation

## 2022-01-19 DIAGNOSIS — M7989 Other specified soft tissue disorders: Secondary | ICD-10-CM | POA: Diagnosis present

## 2022-01-19 LAB — CBC WITH DIFFERENTIAL/PLATELET
Abs Immature Granulocytes: 0.01 10*3/uL (ref 0.00–0.07)
Basophils Absolute: 0 10*3/uL (ref 0.0–0.1)
Basophils Relative: 1 %
Eosinophils Absolute: 0.2 10*3/uL (ref 0.0–0.5)
Eosinophils Relative: 5 %
HCT: 33.6 % — ABNORMAL LOW (ref 39.0–52.0)
Hemoglobin: 10.5 g/dL — ABNORMAL LOW (ref 13.0–17.0)
Immature Granulocytes: 0 %
Lymphocytes Relative: 20 %
Lymphs Abs: 0.7 10*3/uL (ref 0.7–4.0)
MCH: 31 pg (ref 26.0–34.0)
MCHC: 31.3 g/dL (ref 30.0–36.0)
MCV: 99.1 fL (ref 80.0–100.0)
Monocytes Absolute: 0.3 10*3/uL (ref 0.1–1.0)
Monocytes Relative: 9 %
Neutro Abs: 2.1 10*3/uL (ref 1.7–7.7)
Neutrophils Relative %: 65 %
Platelets: 181 10*3/uL (ref 150–400)
RBC: 3.39 MIL/uL — ABNORMAL LOW (ref 4.22–5.81)
RDW: 13.9 % (ref 11.5–15.5)
WBC: 3.3 10*3/uL — ABNORMAL LOW (ref 4.0–10.5)
nRBC: 0 % (ref 0.0–0.2)

## 2022-01-19 LAB — COMPREHENSIVE METABOLIC PANEL
ALT: 25 U/L (ref 0–44)
AST: 23 U/L (ref 15–41)
Albumin: 3.4 g/dL — ABNORMAL LOW (ref 3.5–5.0)
Alkaline Phosphatase: 91 U/L (ref 38–126)
Anion gap: 13 (ref 5–15)
BUN: 39 mg/dL — ABNORMAL HIGH (ref 6–20)
CO2: 29 mmol/L (ref 22–32)
Calcium: 8.9 mg/dL (ref 8.9–10.3)
Chloride: 94 mmol/L — ABNORMAL LOW (ref 98–111)
Creatinine, Ser: 11.18 mg/dL — ABNORMAL HIGH (ref 0.61–1.24)
GFR, Estimated: 6 mL/min — ABNORMAL LOW (ref >60)
Glucose, Bld: 73 mg/dL (ref 70–99)
Potassium: 4.5 mmol/L (ref 3.5–5.1)
Sodium: 136 mmol/L (ref 135–145)
Total Bilirubin: 0.7 mg/dL (ref 0.3–1.2)
Total Protein: 6.2 g/dL — ABNORMAL LOW (ref 6.5–8.1)

## 2022-01-19 MED ORDER — AMLODIPINE BESYLATE 5 MG PO TABS
10.0000 mg | ORAL_TABLET | Freq: Once | ORAL | Status: AC
  Administered 2022-01-19: 10 mg via ORAL
  Filled 2022-01-19: qty 2

## 2022-01-19 MED ORDER — FUROSEMIDE 20 MG PO TABS
40.0000 mg | ORAL_TABLET | Freq: Once | ORAL | Status: AC
  Administered 2022-01-19: 40 mg via ORAL
  Filled 2022-01-19: qty 2

## 2022-01-19 NOTE — ED Notes (Signed)
Pt called for X3. Pt could not be found.

## 2022-01-19 NOTE — ED Triage Notes (Signed)
Patient reports lower legs swelling onset 3 days ago , hemodialysis q Tues/Thurs/Sat . Completed his HD treatment yesterday . Respirations unlabored .

## 2022-01-19 NOTE — Discharge Instructions (Signed)
I have given a one-time dose of Lasix which will help with decreasing fluid in your legs.  Please talk to your nephrologist/dialysis center about your leg swelling.   Elevate your legs  Take your blood pressure medications as prescribed in 8 hours  You may need to increase dialysis to 3 times a week

## 2022-01-19 NOTE — ED Provider Notes (Signed)
[facility] EMERGENCY DEPARTMENT Provider Note   CSN: [id number] Arrival date & time: 01/19/22  6606     History  Chief Complaint  Patient presents with   Lower Legs Swelling    [name] is a 36 y.o. male.  HPI Patient is a 36 year old male past medical history significant for CKD on dialysis.  Presents emergency room today with complaints of ongoing lower extremity swelling.  He states that he does dialysis twice per week.  I question this given that it is typical that patients have dialysis 3 times a week.  He denies any chest pain or difficulty breathing     Home Medications Prior to Admission medications   Medication Sig Start Date End Date Taking? Authorizing Provider  acetaminophen (TYLENOL) 500 MG tablet Take 500 mg by mouth every 6 (six) hours as needed for mild pain or moderate pain.    [provider]  amLODipine (NORVASC) 10 MG tablet Take 1 tablet (10 mg total) by mouth daily. 02/11/21   Zenia Resides, MD  calcitRIOL (ROCALTROL) 0.25 MCG capsule Take 0.25 mcg by mouth daily.    [provider]  calcium acetate (PHOSLO) 667 MG capsule Take 1 capsule (667 mg total) by mouth 3 (three) times daily with meals. 01/04/22   August Albino, MD  calcium elemental as carbonate (TUMS ULTRA 1000) 400 MG chewable tablet Chew 1,000 mg by mouth daily as needed for heartburn.    [provider]  cyclobenzaprine (FLEXERIL) 10 MG tablet Take 1 tablet (10 mg total) by mouth 2 (two) times daily as needed for muscle spasms. 01/05/22   Valarie Merino, MD  dicyclomine (BENTYL) 10 MG capsule Take 1 capsule (10 mg total) by mouth in the morning, at noon, and at bedtime. 01/04/22   August Albino, MD      Allergies    Zestril [lisinopril], Nsaids, and Risperdal [risperidone]    Review of Systems   Review of Systems  Physical Exam Updated Vital Signs BP (!) 189/116   Pulse 78   Temp 98.9 F (37.2 C) (Oral)   Resp (!) 86   SpO2 100%    Patient's respirations are 16 and unlabored.  I confirmed with nurse that respiratory rate of 86 was an entry error. Physical Exam Vitals and nursing note reviewed.  Constitutional:      General: He is not in acute distress. HENT:     Head: Normocephalic and atraumatic.     Nose: Nose normal.  Eyes:     General: No scleral icterus. Cardiovascular:     Rate and Rhythm: Normal rate and regular rhythm.     Pulses: Normal pulses.     Heart sounds: Normal heart sounds.  Pulmonary:     Effort: Pulmonary effort is normal. No respiratory distress.     Breath sounds: No wheezing.     Comments: Lungs without crackles Abdominal:     Palpations: Abdomen is soft.     Tenderness: There is no abdominal tenderness.  Musculoskeletal:     Cervical back: Normal range of motion.     Right lower leg: Edema present.     Left lower leg: Edema present.     Comments: Symmetric lower extremity edema with no calf tenderness.  2+.  Bilateral DP pulses are 3+.  Sensation intact.  Skin:    General: Skin is warm and dry.     Capillary Refill: Capillary refill takes less than 2 seconds.  Neurological:  Mental Status: [name]. Mental status is at baseline.  Psychiatric:        Mood and Affect: Mood normal.        Behavior: Behavior normal.    ED Results / Procedures / Treatments   Labs (all labs ordered are listed, but only abnormal results are displayed) Labs Reviewed  CBC WITH DIFFERENTIAL/PLATELET - Abnormal; Notable for the following components:      Result Value   WBC 3.3 (*)    RBC 3.39 (*)    Hemoglobin 10.5 (*)    HCT 33.6 (*)    All other components within normal limits  COMPREHENSIVE METABOLIC PANEL - Abnormal; Notable for the following components:   Chloride 94 (*)    BUN 39 (*)    Creatinine, Ser 11.18 (*)    Total Protein 6.2 (*)    Albumin 3.4 (*)    GFR, Estimated 6 (*)    All other components within normal limits    EKG None  Radiology No results  found.  Procedures Procedures    Medications Ordered in ED Medications  furosemide (LASIX) tablet 40 mg (has no administration in time range)  amLODipine (NORVASC) tablet 10 mg (has no administration in time range)    ED Course/ Medical Decision Making/ A&P                           Medical Decision Making Amount and/or Complexity of Data Reviewed Labs: ordered.  Risk Prescription drug management.   Patient is a 36 year old male past medical history significant for CKD on dialysis.  Presents emergency room today with complaints of ongoing lower extremity swelling.  He states that he does dialysis twice per week.  I question this given that it is typical that patients have dialysis 3 times a week.  He denies any chest pain or difficulty breathing  CMP with elevated creatinine consistent with dialysis/CKD.  BUN only marginally elevated.  Electrolytes normal.  Specifically potassium within normal limits.  CBC unremarkable.  Patient does have bilateral lower extremity edema with Lasix. Hypertension has not taken his amlodipine today we will provide him with this as well.  Return precautions discussed.  Pt agrees to plan for DC w dialysis FU -he understands that he will likely need to increase to 3 times weekly.  Final Clinical Impression(s) / ED Diagnoses Final diagnoses:  Bilateral leg edema    Rx / DC Orders ED Discharge Orders     None         Tedd Sias, [place] 01/19/22 2148    Jeanell Sparrow, DO 01/20/22 0011

## 2022-02-14 ENCOUNTER — Other Ambulatory Visit: Payer: Self-pay | Admitting: Family Medicine

## 2022-02-14 ENCOUNTER — Telehealth: Payer: Self-pay

## 2022-02-14 DIAGNOSIS — R21 Rash and other nonspecific skin eruption: Secondary | ICD-10-CM

## 2022-02-14 MED ORDER — TRIAMCINOLONE ACETONIDE 0.1 % EX OINT: 1.0000 | TOPICAL_OINTMENT | Freq: Two times a day (BID) | CUTANEOUS | 0 refills | Status: DC

## 2022-02-14 NOTE — Telephone Encounter (Signed)
Rx placed.

## 2022-02-14 NOTE — Telephone Encounter (Signed)
Patient calls nurse line requesting refill on Triamcinolone cream. Patient reports that he missed original appointment at dermatologist due to being hospitalized. He has rescheduled this appointment, however, is not able to be seen until November 6th.   He is requesting refill of cream to get him to this appointment.   If appropriate, please send refill to [facility] on [address].   Talbot Grumbling, RN

## 2022-03-02 ENCOUNTER — Emergency Department (HOSPITAL_COMMUNITY): Payer: Medicare Other

## 2022-03-02 ENCOUNTER — Other Ambulatory Visit: Payer: Self-pay

## 2022-03-02 ENCOUNTER — Observation Stay (HOSPITAL_COMMUNITY): Payer: Medicare Other

## 2022-03-02 ENCOUNTER — Inpatient Hospital Stay (HOSPITAL_COMMUNITY)
Admission: EM | Admit: 2022-03-02 | Discharge: 2022-03-05 | DRG: 640 | Disposition: A | Discharge status: Home / Self Care | Payer: Medicare Other | Attending: Family Medicine | Admitting: Family Medicine

## 2022-03-02 ENCOUNTER — Encounter (HOSPITAL_COMMUNITY): Payer: Self-pay | Admitting: Emergency Medicine

## 2022-03-02 DIAGNOSIS — I3139 Other pericardial effusion (noninflammatory): Secondary | ICD-10-CM | POA: Diagnosis present

## 2022-03-02 DIAGNOSIS — Z1152 Encounter for screening for COVID-19: Secondary | ICD-10-CM

## 2022-03-02 DIAGNOSIS — N186 End stage renal disease: Secondary | ICD-10-CM | POA: Diagnosis not present

## 2022-03-02 DIAGNOSIS — H905 Unspecified sensorineural hearing loss: Secondary | ICD-10-CM | POA: Diagnosis present

## 2022-03-02 DIAGNOSIS — I110 Hypertensive heart disease with heart failure: Secondary | ICD-10-CM | POA: Diagnosis not present

## 2022-03-02 DIAGNOSIS — E875 Hyperkalemia: Secondary | ICD-10-CM

## 2022-03-02 DIAGNOSIS — R0609 Other forms of dyspnea: Secondary | ICD-10-CM

## 2022-03-02 DIAGNOSIS — K219 Gastro-esophageal reflux disease without esophagitis: Secondary | ICD-10-CM | POA: Diagnosis present

## 2022-03-02 DIAGNOSIS — R0600 Dyspnea, unspecified: Secondary | ICD-10-CM | POA: Diagnosis not present

## 2022-03-02 DIAGNOSIS — I10 Essential (primary) hypertension: Secondary | ICD-10-CM | POA: Diagnosis present

## 2022-03-02 DIAGNOSIS — F17290 Nicotine dependence, other tobacco product, uncomplicated: Secondary | ICD-10-CM | POA: Diagnosis present

## 2022-03-02 DIAGNOSIS — R0602 Shortness of breath: Secondary | ICD-10-CM | POA: Diagnosis not present

## 2022-03-02 DIAGNOSIS — Z888 Allergy status to other drugs, medicaments and biological substances status: Secondary | ICD-10-CM

## 2022-03-02 DIAGNOSIS — Z992 Dependence on renal dialysis: Secondary | ICD-10-CM

## 2022-03-02 DIAGNOSIS — Z8249 Family history of ischemic heart disease and other diseases of the circulatory system: Secondary | ICD-10-CM

## 2022-03-02 DIAGNOSIS — I5022 Chronic systolic (congestive) heart failure: Secondary | ICD-10-CM | POA: Diagnosis present

## 2022-03-02 DIAGNOSIS — R079 Chest pain, unspecified: Secondary | ICD-10-CM

## 2022-03-02 DIAGNOSIS — E162 Hypoglycemia, unspecified: Secondary | ICD-10-CM | POA: Diagnosis present

## 2022-03-02 DIAGNOSIS — I2489 Other forms of acute ischemic heart disease: Secondary | ICD-10-CM | POA: Diagnosis present

## 2022-03-02 DIAGNOSIS — E8770 Fluid overload, unspecified: Principal | ICD-10-CM | POA: Diagnosis present

## 2022-03-02 DIAGNOSIS — D631 Anemia in chronic kidney disease: Secondary | ICD-10-CM | POA: Diagnosis present

## 2022-03-02 DIAGNOSIS — J189 Pneumonia, unspecified organism: Secondary | ICD-10-CM | POA: Diagnosis present

## 2022-03-02 DIAGNOSIS — Z720 Tobacco use: Secondary | ICD-10-CM | POA: Diagnosis present

## 2022-03-02 DIAGNOSIS — Q8781 Alport syndrome: Secondary | ICD-10-CM

## 2022-03-02 DIAGNOSIS — H9190 Unspecified hearing loss, unspecified ear: Secondary | ICD-10-CM

## 2022-03-02 DIAGNOSIS — R9389 Abnormal findings on diagnostic imaging of other specified body structures: Secondary | ICD-10-CM

## 2022-03-02 DIAGNOSIS — Z841 Family history of disorders of kidney and ureter: Secondary | ICD-10-CM

## 2022-03-02 DIAGNOSIS — Z79899 Other long term (current) drug therapy: Secondary | ICD-10-CM

## 2022-03-02 DIAGNOSIS — Y95 Nosocomial condition: Secondary | ICD-10-CM | POA: Diagnosis present

## 2022-03-02 DIAGNOSIS — F419 Anxiety disorder, unspecified: Secondary | ICD-10-CM | POA: Diagnosis present

## 2022-03-02 DIAGNOSIS — I160 Hypertensive urgency: Secondary | ICD-10-CM | POA: Diagnosis present

## 2022-03-02 DIAGNOSIS — R69 Illness, unspecified: Secondary | ICD-10-CM

## 2022-03-02 DIAGNOSIS — Z886 Allergy status to analgesic agent status: Secondary | ICD-10-CM

## 2022-03-02 DIAGNOSIS — I132 Hypertensive heart and chronic kidney disease with heart failure and with stage 5 chronic kidney disease, or end stage renal disease: Secondary | ICD-10-CM | POA: Diagnosis present

## 2022-03-02 LAB — CBC WITH DIFFERENTIAL/PLATELET
Abs Immature Granulocytes: 0.02 10*3/uL (ref 0.00–0.07)
Basophils Absolute: 0.1 10*3/uL (ref 0.0–0.1)
Basophils Relative: 1 %
Eosinophils Absolute: 0.3 10*3/uL (ref 0.0–0.5)
Eosinophils Relative: 4 %
HCT: 37.2 % — ABNORMAL LOW (ref 39.0–52.0)
Hemoglobin: 12.3 g/dL — ABNORMAL LOW (ref 13.0–17.0)
Immature Granulocytes: 0 %
Lymphocytes Relative: 28 %
Lymphs Abs: 1.7 10*3/uL (ref 0.7–4.0)
MCH: 30.4 pg (ref 26.0–34.0)
MCHC: 33.1 g/dL (ref 30.0–36.0)
MCV: 92.1 fL (ref 80.0–100.0)
Monocytes Absolute: 0.5 10*3/uL (ref 0.1–1.0)
Monocytes Relative: 8 %
Neutro Abs: 3.5 10*3/uL (ref 1.7–7.7)
Neutrophils Relative %: 59 %
Platelets: 212 10*3/uL (ref 150–400)
RBC: 4.04 MIL/uL — ABNORMAL LOW (ref 4.22–5.81)
RDW: 13.4 % (ref 11.5–15.5)
WBC: 6 10*3/uL (ref 4.0–10.5)
nRBC: 0 % (ref 0.0–0.2)

## 2022-03-02 LAB — BASIC METABOLIC PANEL
Anion gap: 19 — ABNORMAL HIGH (ref 5–15)
BUN: 75 mg/dL — ABNORMAL HIGH (ref 6–20)
CO2: 19 mmol/L — ABNORMAL LOW (ref 22–32)
Calcium: 9.3 mg/dL (ref 8.9–10.3)
Chloride: 100 mmol/L (ref 98–111)
Creatinine, Ser: 17.52 mg/dL — ABNORMAL HIGH (ref 0.61–1.24)
GFR, Estimated: 3 mL/min — ABNORMAL LOW (ref >60)
Glucose, Bld: 74 mg/dL (ref 70–99)
Potassium: 5.4 mmol/L — ABNORMAL HIGH (ref 3.5–5.1)
Sodium: 138 mmol/L (ref 135–145)

## 2022-03-02 LAB — TROPONIN I (HIGH SENSITIVITY)
Troponin I (High Sensitivity): 85 ng/L — ABNORMAL HIGH (ref <18)
Troponin I (High Sensitivity): 163 ng/L (ref <18)
Troponin I (High Sensitivity): 269 ng/L (ref <18)
Troponin I (High Sensitivity): 204 ng/L (ref <18)
Troponin I (High Sensitivity): 560 ng/L (ref <18)

## 2022-03-02 LAB — HEPATIC FUNCTION PANEL
ALT: 26 U/L (ref 0–44)
AST: 25 U/L (ref 15–41)
Albumin: 3.6 g/dL (ref 3.5–5.0)
Alkaline Phosphatase: 71 U/L (ref 38–126)
Bilirubin, Direct: <0.1 mg/dL (ref 0.0–0.2)
Total Bilirubin: 0.6 mg/dL (ref 0.3–1.2)
Total Protein: 6.6 g/dL (ref 6.5–8.1)

## 2022-03-02 LAB — LACTIC ACID, PLASMA
Lactic Acid, Venous: 0.7 mmol/L (ref 0.5–1.9)
Lactic Acid, Venous: 1 mmol/L (ref 0.5–1.9)

## 2022-03-02 LAB — RESP PANEL BY RT-PCR (FLU A&B, COVID) ARPGX2
Influenza A by PCR: NEGATIVE
Influenza B by PCR: NEGATIVE
SARS Coronavirus 2 by RT PCR: NEGATIVE

## 2022-03-02 LAB — BRAIN NATRIURETIC PEPTIDE: B Natriuretic Peptide: 1648.3 pg/mL — ABNORMAL HIGH (ref 0.0–100.0)

## 2022-03-02 LAB — TSH: TSH: 1.169 u[IU]/mL (ref 0.350–4.500)

## 2022-03-02 LAB — HEPATITIS B SURFACE ANTIGEN: Hepatitis B Surface Ag: NONREACTIVE

## 2022-03-02 MED ORDER — LORAZEPAM 2 MG/ML IJ SOLN: 2.0000 mg | Freq: Once | INTRAMUSCULAR | Status: AC

## 2022-03-02 MED ORDER — AMLODIPINE BESYLATE 5 MG PO TABS
10.0000 mg | ORAL_TABLET | Freq: Once | ORAL | Status: AC
  Administered 2022-03-02: 10 mg via ORAL
  Filled 2022-03-02: qty 2

## 2022-03-02 MED ORDER — CHLORHEXIDINE GLUCONATE CLOTH 2 % EX PADS
6.0000 | MEDICATED_PAD | Freq: Every day | CUTANEOUS | Status: DC
  Administered 2022-03-05: 6 via TOPICAL

## 2022-03-02 MED ORDER — MELATONIN 3 MG PO TABS
3.0000 mg | ORAL_TABLET | Freq: Every day | ORAL | Status: DC
  Administered 2022-03-02 – 2022-03-04 (×3): 3 mg via ORAL
  Filled 2022-03-02 (×3): qty 1

## 2022-03-02 MED ORDER — IOHEXOL 350 MG/ML SOLN
80.0000 mL | Freq: Once | INTRAVENOUS | Status: AC | PRN
  Administered 2022-03-02: 80 mL via INTRAVENOUS

## 2022-03-02 MED ORDER — HEPARIN SODIUM (PORCINE) 5000 UNIT/ML IJ SOLN
5000.0000 [IU] | Freq: Three times a day (TID) | INTRAMUSCULAR | Status: DC
  Administered 2022-03-02 – 2022-03-05 (×8): 5000 [IU] via SUBCUTANEOUS
  Filled 2022-03-02 (×8): qty 1

## 2022-03-02 MED ORDER — HYDRALAZINE HCL 20 MG/ML IJ SOLN
10.0000 mg | Freq: Once | INTRAMUSCULAR | Status: AC
  Administered 2022-03-02: 10 mg via INTRAVENOUS
  Filled 2022-03-02: qty 1

## 2022-03-02 MED ORDER — CALCIUM ACETATE (PHOS BINDER) 667 MG PO CAPS
667.0000 mg | ORAL_CAPSULE | Freq: Three times a day (TID) | ORAL | Status: DC
  Administered 2022-03-02 – 2022-03-05 (×6): 667 mg via ORAL
  Filled 2022-03-02 (×8): qty 1

## 2022-03-02 MED ORDER — SODIUM CHLORIDE 0.9 % IV SOLN: 2.0000 g | Freq: Every day | INTRAVENOUS | Status: DC

## 2022-03-02 MED ORDER — HYDRALAZINE HCL 10 MG PO TABS
10.0000 mg | ORAL_TABLET | Freq: Three times a day (TID) | ORAL | Status: AC
  Administered 2022-03-02 – 2022-03-03 (×3): 10 mg via ORAL
  Filled 2022-03-02 (×3): qty 1

## 2022-03-02 MED ORDER — ACETAMINOPHEN 650 MG RE SUPP: 650.0000 mg | Freq: Four times a day (QID) | RECTAL | Status: DC | PRN

## 2022-03-02 MED ORDER — AMLODIPINE BESYLATE 10 MG PO TABS
10.0000 mg | ORAL_TABLET | Freq: Every day | ORAL | Status: DC
  Administered 2022-03-03 – 2022-03-05 (×3): 10 mg via ORAL
  Filled 2022-03-02: qty 1
  Filled 2022-03-02 (×2): qty 2

## 2022-03-02 MED ORDER — ACETAMINOPHEN 325 MG PO TABS
650.0000 mg | ORAL_TABLET | Freq: Four times a day (QID) | ORAL | Status: DC | PRN
  Administered 2022-03-03 (×2): 650 mg via ORAL
  Filled 2022-03-02 (×2): qty 2

## 2022-03-02 MED ORDER — SODIUM CHLORIDE 0.9 % IV SOLN
500.0000 mg | Freq: Once | INTRAVENOUS | Status: AC
  Administered 2022-03-02: 500 mg via INTRAVENOUS
  Filled 2022-03-02: qty 5

## 2022-03-02 MED ORDER — SODIUM ZIRCONIUM CYCLOSILICATE 5 G PO PACK
5.0000 g | PACK | Freq: Two times a day (BID) | ORAL | Status: AC
  Administered 2022-03-02 (×2): 5 g via ORAL
  Filled 2022-03-02 (×2): qty 1

## 2022-03-02 MED ORDER — IPRATROPIUM-ALBUTEROL 0.5-2.5 (3) MG/3ML IN SOLN
3.0000 mL | Freq: Once | RESPIRATORY_TRACT | Status: AC
  Administered 2022-03-02: 3 mL via RESPIRATORY_TRACT
  Filled 2022-03-02: qty 3

## 2022-03-02 MED ORDER — AMLODIPINE BESYLATE 5 MG PO TABS: 10.0000 mg | ORAL_TABLET | Freq: Every day | ORAL | Status: DC

## 2022-03-02 MED ORDER — LORAZEPAM 2 MG/ML IJ SOLN
INTRAMUSCULAR | Status: AC
  Administered 2022-03-02: 2 mg via INTRAVENOUS
  Filled 2022-03-02: qty 1

## 2022-03-02 MED ORDER — NICOTINE 21 MG/24HR TD PT24: 21.0000 mg | MEDICATED_PATCH | Freq: Every day | TRANSDERMAL | Status: DC | PRN

## 2022-03-02 MED ORDER — ASPIRIN 325 MG PO TABS
325.0000 mg | ORAL_TABLET | Freq: Once | ORAL | Status: AC
  Administered 2022-03-02: 325 mg via ORAL
  Filled 2022-03-02: qty 1

## 2022-03-02 MED ORDER — SODIUM CHLORIDE 0.9% FLUSH
3.0000 mL | Freq: Two times a day (BID) | INTRAVENOUS | Status: DC
  Administered 2022-03-03 – 2022-03-05 (×5): 3 mL via INTRAVENOUS

## 2022-03-02 MED ORDER — SODIUM CHLORIDE 0.9 % IV SOLN
1.0000 g | Freq: Once | INTRAVENOUS | Status: AC
  Administered 2022-03-02: 1 g via INTRAVENOUS
  Filled 2022-03-02: qty 10

## 2022-03-02 MED ORDER — SODIUM CHLORIDE 0.9 % IV SOLN: 500.0000 mg | Freq: Every day | INTRAVENOUS | Status: DC

## 2022-03-02 NOTE — ED Notes (Signed)
MD notified that RN was only able to obtain 1 set of blood cultures.

## 2022-03-02 NOTE — ED Triage Notes (Signed)
Patient reports pain across chest with SOB and emesis this evening .

## 2022-03-02 NOTE — Assessment & Plan Note (Addendum)
Stable on RA. Exam unremarkable. Given stability and lower suspicion for PNA, can dc antibiotics. -Amb pulse ox before discharge -Monitor respiratory status

## 2022-03-02 NOTE — ED Notes (Signed)
X1 no response for vitals recheck 

## 2022-03-02 NOTE — Progress Notes (Addendum)
FMTS Brief Progress Note  S:Went to patient room as the MEWs were documented as red. Patient reports that every time he moves he feels significantly short of breath. When he was at home he wouldn't be able to lay flat due to the feeling of being suffocated and fear of not waking up. He feels better now than when he came in.   O: BP (!) 172/112   Pulse (!) 117   Temp 98.4 F (36.9 C)   Resp (!) 24   SpO2 92%   General: NAD but doesn't appear to be feeling very well, hard of hearing but easily conversant Respiratory: breathing comfortably on 2L via [place] Cardio: Tachycardic, appears to be in regular rhythm on telemetry    A/P: Shortness of breath with tachycardia Multifactorial in origin, reassuringly had a negative CTA PE study during admission. Does have concurrent pneumonia on imaging as well as overload in the setting of missed HD sessions, which are likely contributing to his picture. Do have concern with documented oxygen in place, but seems that is more for patient comfort at this time than respiratory distress or desaturations - Closely monitor respiratory status - If worsening, will reach out to nephrology to consider HD overnight  Tachycardia with elevated troponin Likely demand ischemia in the setting of volume overload but will closely monitor - Trend troponin - Continuous telemetry   [place], [place], DO 03/02/2022, 7:35 PM PGY-3, [facility] Night Resident  Please page [phone] with questions.

## 2022-03-02 NOTE — Assessment & Plan Note (Signed)
Patient is hard of hearing but is able to read lips and is appropriately responsive.  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

## 2022-03-02 NOTE — H&P (Cosign Needed Addendum)
Hospital Admission History and Physical Service Pager: [phone]  Patient name: [name] Medical record number: [id number] Date of Birth: [birth year] Age: 36 y.o. Gender: male  Primary Care Provider: [name], DO Consultants: Nephrology Code Status: FULL Preferred Emergency Contact: ex-wife Gwyneth Sprout Information     Name Relation Home Work Mobile   [place] Spouse [phone]     Marnee Guarneri [phone]  [phone]        Chief Complaint: SOB, chest pain  Assessment and Plan: [name] is a 36 y.o. male presenting with SOB and chest pain in the setting of ESRD with missed HD, as well as concern for pneumonia.  Differential diagnoses and management as below.  * Dyspnea P/w dyspnea associated with chest pain, worsening over the last month, as well as cough productive of sputum x2-3 weeks. CXR with RLL opacity concerning for pneumonia. Reassuringly, patient has been afebrile and without leukocytosis. Started on empiric abx for CAP in the ED. COVID negative. Suspect that there is likely a component of volume overload given ESRD with missed HD session on 10/21 and incomplete sessions earlier in the week. Pulmonary exam and O2 saturation are reassuring. Given his pleuritic chest pain and uncontrolled hypertension with elevated troponin, ACS, PE, and aortic dissection are also on the Ddx. HEART score 4-5 with nonspecific changes on EKG. No evidence of pneumothorax on CXR. Will admit for further workup and monitoring with need for inpatient dialysis. -Admit to progressive, FMTS, attending Dr. Owens Shark -Nephrology consulted in the ED, appreciate recs as below -Cardiac monitoring -Continue antibiotics for now, may DC if improves with dialysis -F/u CTA PE, echo -F/u blood culture -F/u lactate  Chest pain Presents with crushing/stabbing, nonradiating chest pain associated with dyspnea and cough ongoing for several weeks.  Troponin 85>163. EKG  with no acute signs of ischemia, but ddx still includes ACS given the nature of his pain. Suspect that elevated troponin may also represent demand ischemia in the setting of his ESRD and possible pneumonia. Pt also reporting orthopnea and heat intolerance concerning for CHF and thyroid pathologies. -ASA 325mg  x1 -Trend troponin until peak -Cardiac monitoring -Treatment of dyspnea/CAP/ESRD as above -f/u CTA PE, Echo -f/u BNP, TSH  ESRD (end stage renal disease) ([place]) Patient receives dialysis TTS.  However, is not consistent with his schedule and missed dialysis on 10/21.  Also with incomplete sessions earlier this week.  Suspect this is contributing to majority of his symptoms.  Patient is not overtly volume overloaded on exam, faint crackles in RLL with no significant lower extremity edema. -Nephrology consulted in the ED, plan for dialysis tomorrow 10/23 (May consider earlier session if worsens) -Home PhosLo -Avoid nephrotoxic medications  Hyperkalemia K 5.4 on admission.  In the setting of missed HD. -Lokelma x2 -HD as above  Hypertension Presents with blood pressures in the 180s/120s.  Has been noted to have uncontrolled hypertension in the past with similar elevations.  On Norvasc at home.  S/p IV hydralazine x1 in the ED.  Concern for hypertensive emergency given concurrent evidence of acute kidney damage and elevated troponins. -P.o. hydralazine every 8 hours x3 -Continue home amlodipine  Hearing loss Patient is hard of hearing but is able to read lips and is appropriately responsive.  Documented history of [place] syndrome  Tobacco use Recently restarted smoking due to personal stressors -Offer nicotine patch       FEN/GI: Renal diet VTE Prophylaxis: Subcutaneous heparin  Disposition: Admit to progressive  History of Present Illness:  [name] is a 36 y.o. male presenting with dyspnea.  Patient reports he has been feeling short of breath for the past month. He  has had intermittent, sharp left-sided chest pain when coughing or catching his breath. He has had a significant amount of coughing with clear sputum for the past 2-3 weeks. He goes to HD TTS 2-3 days per week depending on his work schedule, he reports that he was told that it was OK to go to dialysis 2 days a week. Also reports arm numbness and "pain all over". Unsure if he has had a fever. Reports he has had more dyspnea ever since he started smoking black and milds again.  In the ED, was hypertensive, received IV hydralazine x1.  CXR with RLL opacity concerning for pneumonia, empiric ceftriaxone and azithromycin started.  Nephrology consulted.  Review Of Systems: Per HPI  Pertinent Past Medical History: ESRD HTN Hearing impaired Hypoglycemia  Remainder reviewed in history tab.   Pertinent Past Surgical History: AV fistula placement first in L now with R UE basilic vein transpotion   Remainder reviewed in history tab.  Pertinent Social History: Tobacco use: Yes/No/Former - quit smoking for about 10 years but recently restart smoking 1-2 black and milds per day (due to stress from recent separation) Alcohol use: denies, last drink 20 years ago Other Substance use: "a lot" of marijuana, no other recreational drug use Lives with family for now, wife recently left him 2 months ago. They have 4 kids together.  Pertinent Family History: FMHx heart disease in grandmother Mother had kidney disease and father had hearing impairment  Remainder reviewed in history tab.   Important Outpatient Medications: Norvasc, PhosLo, Flexeril, Bentyl Remainder reviewed in medication history.   Objective: BP (!) 176/123   Pulse 96   Temp 98.5 F (36.9 C)   Resp 16   SpO2 97%  Exam: General: Appears very uncomfortable, tearful, hard of hearing but alert and responsive Cardiovascular: RRR no MRG, 2+ radial, DP, and PT pulses bilaterally Respiratory: Faint crackles in RLL, otherwise clear  bilaterally Gastrointestinal: soft, uncomfortable to palpation diffusely, non distended MSK: moves all extremities, no lower extremity pitting edema   Labs:  CBC BMET  Recent Labs  Lab 03/02/22 0431  WBC 6.0  HGB 12.3*  HCT 37.2*  PLT 212   Recent Labs  Lab 03/02/22 0431  NA 138  K 5.4*  CL 100  CO2 19*  BUN 75*  CREATININE 17.52*  GLUCOSE 74  CALCIUM 9.3    Trop 85>163  EKG: Nonspecific T wave flattening and ST changes in inferolateral leads   Imaging Studies Performed:  CXR IMPRESSION: 1. Opacity at the right base, asymmetry suggesting pneumonia. 2. Low volume chest.  My Interpretation: Agree, RLL opacity   August Albino, MD 03/02/2022, 3:20 PM PGY-1, [facility] Intern pager: [phone], text pages welcome Secure chat group [facility]   I was personally present and performed or re-performed the history, physical exam and medical decision making activities of this service and have verified that the service and findings are accurately documented in the resident's note.  Zola Button, MD                  03/02/2022, 3:24 PM

## 2022-03-02 NOTE — ED Provider Notes (Addendum)
[facility] EMERGENCY DEPARTMENT Provider Note   CSN: [id number] Arrival date & time: 03/02/22  0404     History  Chief Complaint  Patient presents with   Chest Pain    [name] is a 36 y.o. male.  HPI Patient is very hard of hearing.  He has to lip read. Patient reports has been having significant increasing difficulty breathing, particularly lying flat at night.  He reports he has not really been been able to sleep.  He reports he has had a lot of coughing.  He reports his whole chest hurts when he coughs but he does not have any specific pains on one side of the other.  He reports he goes to his dialysis sessions.  He reports his last session was on Thursday.  He reports he was told if he went 2 times a week that was enough for him.  He is scheduled for a Tuesday Thursday Saturday.  Patient did not go to dialysis on Saturday.  He reports his legs are aching and cramping.  He denies significant swelling at this time.  He reports he is compliant with his blood pressure medications but because he has been waiting in the emergency department he has not taken his medications for today.  He reports he did start smoking again.  He had quit for 15 years.  He reports he is under a lot of stress right now due to a break-up.    Home Medications Prior to Admission medications   Medication Sig Start Date End Date Taking? Authorizing Provider  acetaminophen (TYLENOL) 500 MG tablet Take 500 mg by mouth every 6 (six) hours as needed for mild pain or moderate pain.    [provider]  amLODipine (NORVASC) 10 MG tablet Take 1 tablet (10 mg total) by mouth daily. 02/11/21   Zenia Resides, MD  calcitRIOL (ROCALTROL) 0.25 MCG capsule Take 0.25 mcg by mouth daily.    [provider]  calcium acetate (PHOSLO) 667 MG capsule Take 1 capsule (667 mg total) by mouth 3 (three) times daily with meals. 01/04/22   August Albino, MD  calcium elemental as carbonate (TUMS  ULTRA 1000) 400 MG chewable tablet Chew 1,000 mg by mouth daily as needed for heartburn.    [provider]  cyclobenzaprine (FLEXERIL) 10 MG tablet Take 1 tablet (10 mg total) by mouth 2 (two) times daily as needed for muscle spasms. 01/05/22   Valarie Merino, MD  dicyclomine (BENTYL) 10 MG capsule Take 1 capsule (10 mg total) by mouth in the morning, at noon, and at bedtime. 01/04/22   August Albino, MD  triamcinolone ointment (KENALOG) 0.1 % Apply 1 Application topically 2 (two) times daily. 02/14/22   [name], DO      Allergies    Zestril [lisinopril], Nsaids, and Risperdal [risperidone]    Review of Systems   Review of Systems  Physical Exam Updated Vital Signs BP (!) 161/117 (BP Location: Left Arm)   Pulse 97   Temp 98.5 F (36.9 C)   Resp 16   SpO2 99%  Physical Exam Constitutional:      Comments: Patient is alert.  Tachypnea at rest.  Speaking in full sentences.  Well-nourished well-developed.  HENT:     Head: Normocephalic.  Eyes:     Extraocular Movements: Extraocular movements intact.  Cardiovascular:     Comments:  Borderline tachycardia.  No gross rub or gallop. Pulmonary:     Comments: No respiratory  distress at rest.  He is tachypneic.  He has frequent dry cough.  No significant crackle.  Occasional expiratory wheeze. Abdominal:     General: There is no distension.     Palpations: Abdomen is soft.     Tenderness: There is no abdominal tenderness. There is no guarding.  Musculoskeletal:     Comments: Calves are soft and pliable.  Trace edema in the pretibial area.  Skin:    General: Skin is dry.  Neurological:     General: No focal deficit present.     Mental Status: He is oriented to person, place, and time.     Coordination: Coordination normal.  Psychiatric:        Mood and Affect: Mood normal.     ED Results / Procedures / Treatments   Labs (all labs ordered are listed, but only abnormal results are displayed) Labs Reviewed   CBC WITH DIFFERENTIAL/PLATELET - Abnormal; Notable for the following components:      Result Value   RBC 4.04 (*)    Hemoglobin 12.3 (*)    HCT 37.2 (*)    All other components within normal limits  BASIC METABOLIC PANEL - Abnormal; Notable for the following components:   Potassium 5.4 (*)    CO2 19 (*)    BUN 75 (*)    Creatinine, Ser 17.52 (*)    GFR, Estimated 3 (*)    Anion gap 19 (*)    All other components within normal limits  TROPONIN I (HIGH SENSITIVITY) - Abnormal; Notable for the following components:   Troponin I (High Sensitivity) 85 (*)    All other components within normal limits  TROPONIN I (HIGH SENSITIVITY) - Abnormal; Notable for the following components:   Troponin I (High Sensitivity) 163 (*)    All other components within normal limits  RESP PANEL BY RT-PCR (FLU A&B, COVID) ARPGX2  CULTURE, BLOOD (ROUTINE X 2)  CULTURE, BLOOD (ROUTINE X 2)  LACTIC ACID, PLASMA  LACTIC ACID, PLASMA  HEPATIC FUNCTION PANEL    EKG EKG Interpretation  Date/Time:  Sunday March 02 2022 04:07:00 EDT Ventricular Rate:  100 PR Interval:  138 QRS Duration: 80 QT Interval:  362 QTC Calculation: 466 R Axis:   21 Text Interpretation: Normal sinus rhythm Left ventricular hypertrophy with repolarization abnormality ( R in aVL , Sokolow-Lyon , Romhilt-Estes ) Cannot rule out Septal infarct , age undetermined Abnormal ECG When compared with ECG of 05-Jan-2022 01:47, PREVIOUS ECG IS PRESENT nonspecific inferior and lateral ST depression compared to previous Confirmed by Charlesetta Shanks [phone]) on 03/02/2022 1:03:37 PM  Radiology DG Chest 2 View  Result Date: 03/02/2022 CLINICAL DATA:  Chest pain.  End-stage renal disease EXAM: CHEST - 2 VIEW COMPARISON:  01/05/2022 FINDINGS: Asymmetric opacity at the right lung base. Low volume chest with interstitial coarsening but no Kerley lines or effusion. Normal heart size. IMPRESSION: 1. Opacity at the right base, asymmetry suggesting  pneumonia. 2. Low volume chest. Electronically Signed   By: Jorje Guild M.D.   On: 03/02/2022 05:06    Procedures Procedures   CRITICAL CARE Performed by: Charlesetta Shanks   Total critical care time: 30 minutes  Critical care time was exclusive of separately billable procedures and treating other patients.  Critical care was necessary to treat or prevent imminent or life-threatening deterioration.  Critical care was time spent personally by me on the following activities: development of treatment plan with patient and/or surrogate as well as nursing, discussions with consultants, evaluation  of patient's response to treatment, examination of patient, obtaining history from patient or surrogate, ordering and performing treatments and interventions, ordering and review of laboratory studies, ordering and review of radiographic studies, pulse oximetry and re-evaluation of patient's condition.  Medications Ordered in ED Medications  ipratropium-albuterol (DUONEB) 0.5-2.5 (3) MG/3ML nebulizer solution 3 mL (has no administration in time range)  amLODipine (NORVASC) tablet 10 mg (has no administration in time range)  hydrALAZINE (APRESOLINE) injection 10 mg (has no administration in time range)  cefTRIAXone (ROCEPHIN) 1 g in sodium chloride 0.9 % 100 mL IVPB (has no administration in time range)  azithromycin (ZITHROMAX) 500 mg in sodium chloride 0.9 % 250 mL IVPB (has no administration in time range)    ED Course/ Medical Decision Making/ A&P                           Medical Decision Making Amount and/or Complexity of Data Reviewed Labs: ordered. Radiology: ordered.  Risk Prescription drug management. Decision regarding hospitalization.   Patient has significant comorbid conditions.  End-stage renal failure on dialysis.  Uncontrolled hypertension.  Patient presents with combination of cough, shortness of breath and chest discomfort.  Will need to proceed with full diagnostic  evaluation including chest x-ray lab work EKG.  High risk for complex medical conditions such as pneumonia\hypertensive emergency\ACS\PE\dissection.  Chest x-ray reviewed by radiology and also visually reviewed by myself, right opacity at the lung base.  No significant volume overload appearance or congestion.  EKG reviewed by myself similar to previous but some nonspecific T wave flattening.  Patient is significantly hypertensive with diastolics at 329.  Will initiate treatment with a DuoNeb for suspected pneumonia based on chest x-ray findings and patient's report of a lot of coughing.  Will initiate antibiotic therapy with Rocephin and Zithromax.  Consult: 13: 22 reviewed with Dr. Joylene Grapes of nephrology.  Will do consult.  Consult: 13: 25 reviewed with family medicine teaching service for admission.  Patient has clear mental status.  He has tachypnea but not significant respiratory distress at rest.  He does not need emergent dialysis.  Treatment has been initiated for community-acquired pneumonia and hypertensive urgency.  Still awaiting PE study to rule out PE.  With troponin elevation, concern is for possible pulmonary infarct versus demand ischemia.  Patient is not having chest pain at rest.  he reports only chest pain with coughing.  I had reviewed the patient's evaluation, management and plan at bedside with the patient's mother and other family member at bedside after patient was admitted.        Final Clinical Impression(s) / ED Diagnoses Final diagnoses:  Pneumonia of right lower lobe due to infectious organism  ESRD (end stage renal disease) on dialysis [facility])  Hypertensive urgency  Severe comorbid illness    Rx / DC Orders ED Discharge Orders     None         Charlesetta Shanks, MD 03/02/22 1328    Charlesetta Shanks, MD 03/02/22 1648

## 2022-03-02 NOTE — ED Provider Triage Note (Signed)
Emergency Medicine Provider Triage Evaluation Note  [name] , a 36 y.o. male  was evaluated in triage.  Pt complains of chest pain and SOB.  Will not answer questions and give details regarding symptoms.  Does have hx of ESRD on HD-- will not confirm last treatment despite being asked multiple times.  Review of Systems  Positive: Chest pain, SOB Negative: fever  Physical Exam  BP (!) 171/112 (BP Location: Left Arm)   Pulse 95   Temp 97.6 F (36.4 C) (Oral)   Resp (!) 22   SpO2 100%   Gen:   Awake, no distress   Resp:  Normal effort  MSK:   Moves extremities without difficulty  Other:    Medical Decision Making  Medically screening exam initiated at 4:24 AM.  Appropriate orders placed.  [name] was informed that the remainder of the evaluation will be completed by another provider, this initial triage assessment does not replace that evaluation, and the importance of remaining in the ED until their evaluation is complete.  Chest pain.  ESRD on HD.  Will not answer questions for this provider in triage.  Unclear last HD treatment.  EKG, labs, CXR.   Larene Pickett, PA-C 03/02/22 0425

## 2022-03-02 NOTE — Assessment & Plan Note (Addendum)
Pressures remain elevated. -P.o. hydralazine every 8 hours x3 -Continue home amlodipine -Increase coreg to 12.5 mg BID

## 2022-03-02 NOTE — ED Notes (Signed)
I tried to get patient blood I didn't have any success, The Nurse was informed.

## 2022-03-02 NOTE — ED Notes (Signed)
X2 no response for vitals recheck or lab draw

## 2022-03-02 NOTE — ED Notes (Signed)
Patient signed consent form for hemodialysis .

## 2022-03-02 NOTE — Progress Notes (Signed)
  Echocardiogram 2D Echocardiogram has been performed.  [name] 03/02/2022, 5:00 PM

## 2022-03-02 NOTE — Assessment & Plan Note (Addendum)
Resolved.  Troponins plateaued, BNP WNL.  Normal EKGs.

## 2022-03-02 NOTE — Progress Notes (Addendum)
Brief nephrology note  36 year old ESRD patient presents with chest pain.  Imaging concerning for pneumonia.  Also undergoing work-up for possible pulmonary embolism.  Patient missed dialysis on Saturday and had shortened treatments on Tuesday and Thursday.  Likely has a component of volume overload but not extensive. I examined the patient and he had crackles w/ iwob but crackles were unilateral on the right and some anxiety component so not obviously over pulmonary edema  We will plan on dialysis tomorrow.  Blood pressures fairly elevated but this is consistent with outpatient blood pressures. If respiratory status worsens we could consider dialysis today/tonight but staff is limited at this time.  We will see the patient from an observation standpoint at this time.  If the patient's hospitalization was prolonged we will do a formal consult.

## 2022-03-02 NOTE — Assessment & Plan Note (Signed)
Recently restarted smoking due to personal stressors -Offer nicotine patch

## 2022-03-02 NOTE — Assessment & Plan Note (Addendum)
Stable.  Resume outpatient TTS HD. -Home PhosLo -Avoid nephrotoxic medications

## 2022-03-02 NOTE — Assessment & Plan Note (Signed)
K 5.4 on admission.  In the setting of missed HD. -Lokelma x2 -HD as above

## 2022-03-02 NOTE — ED Notes (Signed)
Pt transported to CT ?

## 2022-03-02 NOTE — ED Notes (Signed)
Pt stated they told him he had to wait 11 hour to be seen. Pt stated that he went to his car to wait. Pt is hard of hearing.

## 2022-03-02 NOTE — ED Notes (Signed)
Admitting MD notified on patient's abnormal blood tests results and requested sleeping medication .

## 2022-03-03 ENCOUNTER — Encounter (HOSPITAL_COMMUNITY): Payer: Self-pay | Admitting: Family Medicine

## 2022-03-03 DIAGNOSIS — Z1152 Encounter for screening for COVID-19: Secondary | ICD-10-CM | POA: Diagnosis not present

## 2022-03-03 DIAGNOSIS — I132 Hypertensive heart and chronic kidney disease with heart failure and with stage 5 chronic kidney disease, or end stage renal disease: Secondary | ICD-10-CM | POA: Diagnosis present

## 2022-03-03 DIAGNOSIS — E162 Hypoglycemia, unspecified: Secondary | ICD-10-CM | POA: Diagnosis present

## 2022-03-03 DIAGNOSIS — N186 End stage renal disease: Secondary | ICD-10-CM | POA: Diagnosis present

## 2022-03-03 DIAGNOSIS — F419 Anxiety disorder, unspecified: Secondary | ICD-10-CM | POA: Diagnosis present

## 2022-03-03 DIAGNOSIS — Z888 Allergy status to other drugs, medicaments and biological substances status: Secondary | ICD-10-CM | POA: Diagnosis not present

## 2022-03-03 DIAGNOSIS — I2489 Other forms of acute ischemic heart disease: Secondary | ICD-10-CM | POA: Diagnosis present

## 2022-03-03 DIAGNOSIS — K219 Gastro-esophageal reflux disease without esophagitis: Secondary | ICD-10-CM | POA: Diagnosis present

## 2022-03-03 DIAGNOSIS — H905 Unspecified sensorineural hearing loss: Secondary | ICD-10-CM | POA: Diagnosis present

## 2022-03-03 DIAGNOSIS — Z79899 Other long term (current) drug therapy: Secondary | ICD-10-CM | POA: Diagnosis not present

## 2022-03-03 DIAGNOSIS — D631 Anemia in chronic kidney disease: Secondary | ICD-10-CM | POA: Diagnosis present

## 2022-03-03 DIAGNOSIS — R0600 Dyspnea, unspecified: Secondary | ICD-10-CM | POA: Diagnosis present

## 2022-03-03 DIAGNOSIS — R079 Chest pain, unspecified: Secondary | ICD-10-CM | POA: Diagnosis not present

## 2022-03-03 DIAGNOSIS — Z841 Family history of disorders of kidney and ureter: Secondary | ICD-10-CM | POA: Diagnosis not present

## 2022-03-03 DIAGNOSIS — F17290 Nicotine dependence, other tobacco product, uncomplicated: Secondary | ICD-10-CM | POA: Diagnosis present

## 2022-03-03 DIAGNOSIS — Z992 Dependence on renal dialysis: Secondary | ICD-10-CM | POA: Diagnosis not present

## 2022-03-03 DIAGNOSIS — E8770 Fluid overload, unspecified: Secondary | ICD-10-CM | POA: Diagnosis present

## 2022-03-03 DIAGNOSIS — Y95 Nosocomial condition: Secondary | ICD-10-CM | POA: Diagnosis present

## 2022-03-03 DIAGNOSIS — J189 Pneumonia, unspecified organism: Secondary | ICD-10-CM

## 2022-03-03 DIAGNOSIS — Z886 Allergy status to analgesic agent status: Secondary | ICD-10-CM | POA: Diagnosis not present

## 2022-03-03 DIAGNOSIS — I160 Hypertensive urgency: Secondary | ICD-10-CM | POA: Diagnosis present

## 2022-03-03 DIAGNOSIS — E875 Hyperkalemia: Secondary | ICD-10-CM | POA: Diagnosis present

## 2022-03-03 DIAGNOSIS — Z8249 Family history of ischemic heart disease and other diseases of the circulatory system: Secondary | ICD-10-CM | POA: Diagnosis not present

## 2022-03-03 DIAGNOSIS — Q8781 Alport syndrome: Secondary | ICD-10-CM | POA: Diagnosis not present

## 2022-03-03 DIAGNOSIS — R9389 Abnormal findings on diagnostic imaging of other specified body structures: Secondary | ICD-10-CM

## 2022-03-03 DIAGNOSIS — I5022 Chronic systolic (congestive) heart failure: Secondary | ICD-10-CM | POA: Diagnosis present

## 2022-03-03 DIAGNOSIS — I3139 Other pericardial effusion (noninflammatory): Secondary | ICD-10-CM | POA: Diagnosis present

## 2022-03-03 LAB — CBC
HCT: 39.3 % (ref 39.0–52.0)
Hemoglobin: 13 g/dL (ref 13.0–17.0)
MCH: 30 pg (ref 26.0–34.0)
MCHC: 33.1 g/dL (ref 30.0–36.0)
MCV: 90.8 fL (ref 80.0–100.0)
Platelets: 196 10*3/uL (ref 150–400)
RBC: 4.33 MIL/uL (ref 4.22–5.81)
RDW: 13.4 % (ref 11.5–15.5)
WBC: 7.3 10*3/uL (ref 4.0–10.5)
nRBC: 0 % (ref 0.0–0.2)

## 2022-03-03 LAB — BASIC METABOLIC PANEL
Anion gap: 18 — ABNORMAL HIGH (ref 5–15)
BUN: 87 mg/dL — ABNORMAL HIGH (ref 6–20)
CO2: 17 mmol/L — ABNORMAL LOW (ref 22–32)
Calcium: 8.9 mg/dL (ref 8.9–10.3)
Chloride: 103 mmol/L (ref 98–111)
Creatinine, Ser: 19.32 mg/dL — ABNORMAL HIGH (ref 0.61–1.24)
GFR, Estimated: 3 mL/min — ABNORMAL LOW (ref >60)
Glucose, Bld: 72 mg/dL (ref 70–99)
Potassium: 5.4 mmol/L — ABNORMAL HIGH (ref 3.5–5.1)
Sodium: 138 mmol/L (ref 135–145)

## 2022-03-03 LAB — ECHOCARDIOGRAM COMPLETE
AR max vel: 2.17 cm2
AV Area VTI: 1.85 cm2
AV Area mean vel: 1.78 cm2
AV Mean grad: 6.1 mmHg
AV Peak grad: 9.5 mmHg
Ao pk vel: 1.54 m/s
S' Lateral: 4.5 cm
Single Plane A4C EF: 43.3 %

## 2022-03-03 LAB — TROPONIN I (HIGH SENSITIVITY)
Troponin I (High Sensitivity): 515 ng/L (ref <18)
Troponin I (High Sensitivity): 559 ng/L (ref <18)
Troponin I (High Sensitivity): 565 ng/L (ref <18)
Troponin I (High Sensitivity): 950 ng/L (ref <18)

## 2022-03-03 LAB — HEPATITIS B SURFACE ANTIBODY, QUANTITATIVE: Hep B S AB Quant (Post): 231.2 m[IU]/mL (ref >9.9)

## 2022-03-03 MED ORDER — AZITHROMYCIN 250 MG PO TABS
500.0000 mg | ORAL_TABLET | Freq: Every day | ORAL | Status: DC
  Administered 2022-03-03: 500 mg via ORAL
  Filled 2022-03-03: qty 2
  Filled 2022-03-03: qty 1

## 2022-03-03 MED ORDER — ALTEPLASE 2 MG IJ SOLR: 2.0000 mg | Freq: Once | INTRAMUSCULAR | Status: DC | PRN

## 2022-03-03 MED ORDER — HYDRALAZINE HCL 25 MG PO TABS
25.0000 mg | ORAL_TABLET | Freq: Three times a day (TID) | ORAL | Status: DC
  Administered 2022-03-03 – 2022-03-05 (×5): 25 mg via ORAL
  Filled 2022-03-03 (×5): qty 1

## 2022-03-03 MED ORDER — ACETAMINOPHEN 650 MG RE SUPP: 650.0000 mg | Freq: Four times a day (QID) | RECTAL | Status: DC | PRN

## 2022-03-03 MED ORDER — CEFDINIR 300 MG PO CAPS
300.0000 mg | ORAL_CAPSULE | Freq: Every day | ORAL | Status: DC
  Filled 2022-03-03 (×2): qty 1

## 2022-03-03 MED ORDER — HEPARIN SODIUM (PORCINE) 1000 UNIT/ML DIALYSIS: 1000.0000 [IU] | INTRAMUSCULAR | Status: DC | PRN

## 2022-03-03 MED ORDER — CARVEDILOL 3.125 MG PO TABS
6.2500 mg | ORAL_TABLET | Freq: Two times a day (BID) | ORAL | Status: DC
  Administered 2022-03-03 – 2022-03-04 (×2): 6.25 mg via ORAL
  Filled 2022-03-03 (×2): qty 2

## 2022-03-03 MED ORDER — PENTAFLUOROPROP-TETRAFLUOROETH EX AERO: 1.0000 | INHALATION_SPRAY | CUTANEOUS | Status: DC | PRN

## 2022-03-03 MED ORDER — ACETAMINOPHEN 500 MG PO TABS
1000.0000 mg | ORAL_TABLET | Freq: Four times a day (QID) | ORAL | Status: DC | PRN
  Administered 2022-03-03 – 2022-03-05 (×3): 1000 mg via ORAL
  Filled 2022-03-03 (×3): qty 2

## 2022-03-03 MED ORDER — PROCHLORPERAZINE MALEATE 5 MG PO TABS
10.0000 mg | ORAL_TABLET | Freq: Once | ORAL | Status: AC
  Administered 2022-03-03: 10 mg via ORAL
  Filled 2022-03-03: qty 2

## 2022-03-03 MED ORDER — LIDOCAINE HCL (PF) 1 % IJ SOLN: 5.0000 mL | INTRAMUSCULAR | Status: DC | PRN

## 2022-03-03 MED ORDER — LIDOCAINE-PRILOCAINE 2.5-2.5 % EX CREA: 1.0000 | TOPICAL_CREAM | CUTANEOUS | Status: DC | PRN

## 2022-03-03 MED ORDER — HYDROXYZINE HCL 25 MG PO TABS
25.0000 mg | ORAL_TABLET | Freq: Once | ORAL | Status: AC
  Administered 2022-03-03: 25 mg via ORAL
  Filled 2022-03-03: qty 1

## 2022-03-03 NOTE — ED Notes (Signed)
Pt started crying due to severe headache. MD notified and came at bedside. Also, MD is aware of trop of 900. Pt is alert and unsure of orientation due to pain. Will continue to monitor.

## 2022-03-03 NOTE — Assessment & Plan Note (Signed)
S/p Lokelma.  Receiving dialysis. - Follow-up BMP

## 2022-03-03 NOTE — Progress Notes (Signed)
I was paged by nursing staff at 4:30pm that patient was having intolerable headache despite Tylenol.  I evaluated patient at bedside at approximately 4:40 p.m., believe this to be migraine.  He reports that he typically has better pain relief with higher doses of Tylenol.  I will redose Tylenol and add Compazine.  Additionally patient endorsed difficulty swallowing food, and would like to have this evaluated.  I have put in SLP consult.

## 2022-03-03 NOTE — ED Notes (Signed)
Patient crying and very anxious stating his headache was getting worse. Currently waiting on response from admitting for orders. Patient complaining of feeling very hot. Cold washcloth placed on forehead and bedside fan provided to patient.

## 2022-03-03 NOTE — Progress Notes (Signed)
Pt receives out-pt HD at [facility] on TTS. Pt arrives at 6:30 am for 6:50 am chair time. Will assist as needed.   Melven Sartorius Renal Navigator [phone]

## 2022-03-03 NOTE — Consult Note (Addendum)
Cardiology Consultation   Patient ID: [name] MRN: [id number]; DOB: 07/16/1985  Admit date: 03/02/2022 Date of Consult: 03/03/2022  PCP:  [name], [facility] Cardiologist:  New (Dr. Claiborne Billings    Patient Profile:   [name] is a 36 y.o. male with a hx of ESRD on HD, HTN, GERD, Bipolar and congential hearing loss who is being seen 03/03/2022 for the evaluation of chest pain/elevated troponin at the request of Dr. Owens Shark.  History of Present Illness:   [name] is a 36 yo male with PMH noted above. He has not been evaluated by cardiology in the past.   Presented to ED on 10/22 with chest pain and shortness of breath after missed HD session.  He reported being instructed that he was okay to only undergo 2 HD sessions a week.  He communicates that he is normally scheduled for Tuesday Thursday Saturday.  He missed his Saturday session.  Complained of chest discomfort with coughing.  Initial blood pressures in the ED, 171/112. Labs in the ED showed sodium 138, potassium 5.4, creatinine 17.5, high-sensitivity troponin 85>> 163>> 269>> 560>>515, lactic acid 0.7, BNP 1648, WBC 6, hemoglobin 12.3.  EKG showed sinus tachycardia, LVH. Respiratory panel negative.  Chest x-ray suggestive of pneumonia.  He was admitted to internal medicine teaching service.  Nephrology consulted to undergo HD.   Echocardiogram 03/02/22 showed LVEF of 40 to 45%, global hypokinesis, normal RV size and function, small to moderate pericardial effusion which was circumferential with no evidence of cardiac tamponade, mild MR. Cardiology asked to evaluate.    Past Medical History:  Diagnosis Date   Bipolar 1 disorder ([place])    CKD (chronic kidney disease)    on dialysis   Depression    GERD (gastroesophageal reflux disease)    Hearing difficulty of left ear    75% hearing   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   Hypertension    Low blood sugar    Renal  disorder     Past Surgical History:  Procedure Laterality Date   APPENDECTOMY     AV FISTULA PLACEMENT Left 03/03/2019   Procedure: ARTERIOVENOUS (AV) FISTULA CREATION LEFT ARM;  Surgeon: Waynetta Sandy, MD;  Location: [facility];  Service: Vascular;  Laterality: Left;   [place] Right 04/12/2019   Procedure: RIGHT UPPER EXTREMITY [place];  Surgeon: Angelia Mould, MD;  Location: [facility];  Service: Vascular;  Laterality: Right;   BIOPSY  10/14/2021   Procedure: BIOPSY;  Surgeon: Doran Stabler, MD;  Location: [facility];  Service: Gastroenterology;;   COLONOSCOPY WITH PROPOFOL N/A 10/14/2021   Procedure: COLONOSCOPY WITH PROPOFOL;  Surgeon: Doran Stabler, MD;  Location: [facility];  Service: Gastroenterology;  Laterality: N/A;   LIGATION OF ARTERIOVENOUS  FISTULA Left 03/06/2019   Procedure: LIGATION OF ARTERIOVENOUS  FISTULA;  Surgeon: Marty Heck, MD;  Location: [facility] OR;  Service: Vascular;  Laterality: Left;   spinal tap     SPINE SURGERY     related to a spinal infection, unsure of surgery or infection source   WISDOM TOOTH EXTRACTION       Home Medications:  Prior to Admission medications   Medication Sig Start Date End Date Taking? Authorizing Provider  acetaminophen (TYLENOL) 500 MG tablet Take 500 mg by mouth every 6 (six) hours as needed for mild pain or moderate pain.  Yes [provider]  amLODipine (NORVASC) 10 MG tablet Take 1 tablet (10 mg total) by mouth daily. 02/11/21  Yes Hensel, Jamal Collin, MD  calcitRIOL (ROCALTROL) 0.25 MCG capsule Take 0.25 mcg by mouth daily.   Yes [provider]  calcium acetate (PHOSLO) 667 MG capsule Take 1 capsule (667 mg total) by mouth 3 (three) times daily with meals. 01/04/22  Yes August Albino, MD  carvedilol (COREG) 6.25 MG tablet Take 6.25 mg by mouth 2 (two) times daily. 02/25/22  Yes [provider]  cyclobenzaprine  (FLEXERIL) 10 MG tablet Take 1 tablet (10 mg total) by mouth 2 (two) times daily as needed for muscle spasms. 01/05/22  Yes Valarie Merino, MD  pantoprazole (PROTONIX) 40 MG tablet Take 40 mg by mouth daily.   Yes [provider]  triamcinolone ointment (KENALOG) 0.1 % Apply 1 Application topically 2 (two) times daily. 02/14/22  Yes [name], DO  dicyclomine (BENTYL) 10 MG capsule Take 1 capsule (10 mg total) by mouth in the morning, at noon, and at bedtime. Patient not taking: Reported on 03/03/2022 01/04/22   August Albino, MD    Inpatient Medications: Scheduled Meds:  amLODipine  10 mg Oral Daily   azithromycin  500 mg Oral Q1400   calcium acetate  667 mg Oral TID WC   cefdinir  300 mg Oral Q1400   Chlorhexidine Gluconate Cloth  6 each Topical Q0600   heparin  5,000 Units Subcutaneous Q8H   melatonin  3 mg Oral QHS   sodium chloride flush  3 mL Intravenous Q12H   Continuous Infusions:  PRN Meds: acetaminophen **OR** acetaminophen, nicotine  Allergies:    Allergies  Allergen Reactions   Zestril [Lisinopril] Swelling   Nsaids Other (See Comments)    "Kidney problems "   Risperdal [Risperidone] Other (See Comments)    Unknown reaction     Social History:   Social History   Socioeconomic History   Marital status: Married    Spouse name: Not on file   Number of children: 3   Years of education: Not on file   Highest education level: Not on file  Occupational History   Occupation: HVAC  Tobacco Use   Smoking status: Former    Packs/day: 0.30    Types: Cigarettes    Quit date: 02/27/2016    Years since quitting: 6.0   Smokeless tobacco: Never  Vaping Use   Vaping Use: Never used  Substance and Sexual Activity   Alcohol use: Not Currently    Comment: rare   Drug use: Yes    Types: Marijuana   Sexual activity: Not Currently  Other Topics Concern   Not on file  Social History Narrative   Not on file   Social Determinants of Health    Financial Resource Strain: Not on file  Food Insecurity: Not on file  Transportation Needs: Not on file  Physical Activity: Not on file  Stress: Not on file  Social Connections: Not on file  Intimate Partner Violence: Not on file    Family History:    Family History  Problem Relation Age of Onset   Hypertension Mother    Kidney failure Mother    Diabetes Mother    Hearing loss Father    Hypertension Sister    Heart disease Maternal Grandmother    Stroke Maternal Grandfather    Asthma Son      ROS:  Please see the history of present illness.   All other  ROS reviewed and negative.     Physical Exam/Data:   Vitals:   03/03/22 1200 03/03/22 1230 03/03/22 1247 03/03/22 1346  BP: (!) 162/115 (!) 173/103 (!) 163/115 (!) 172/121  Pulse: (!) 114 (!) 112 (!) 111 (!) 108  Resp: (!) 23 (!) 25 19 18   Temp:   98.1 F (36.7 C) 98 F (36.7 C)  TempSrc:    Oral  SpO2: 99% 100% 98% 96%  Weight:  70.1 kg      Intake/Output Summary (Last 24 hours) at 03/03/2022 1528 Last data filed at 03/03/2022 1247 Gross per 24 hour  Intake --  Output 2700 ml  Net -2700 ml      03/03/2022   12:30 PM 03/03/2022    9:48 AM 01/04/2022   12:16 PM  Last 3 Weights  Weight (lbs) 154 lb 8.7 oz 161 lb 155 lb 6.8 oz  Weight (kg) 70.1 kg 73.029 kg 70.5 kg     Body mass index is 23.5 kg/m.  General:  Well nourished, well developed, in no acute distress HEENT: normal Neck: no JVD Vascular: No carotid bruits; Distal pulses 2+ bilaterally Cardiac:  normal S1, S2; tachycardic; no murmur  Lungs:  clear to auscultation bilaterally, no wheezing, rhonchi or rales  Abd: soft, nontender, no hepatomegaly  Ext: no edema Musculoskeletal:  No deformities, BUE and BLE strength normal and equal Skin: warm and dry  Neuro:  CNs 2-12 intact, no focal abnormalities noted Psych:  Normal affect   EKG:  The EKG was personally reviewed and demonstrates:  Sinus Tachycardia, LVH Telemetry:  Telemetry was  personally reviewed and demonstrates:  N/a   Relevant CV Studies:  Echo: 03/02/22  IMPRESSIONS     1. Left ventricular ejection fraction, by estimation, is 40 to 45%. The  left ventricle has mildly decreased function. The left ventricle  demonstrates global hypokinesis. There is mild left ventricular  hypertrophy. Left ventricular diastolic parameters  are indeterminate.   2. Right ventricular systolic function is normal. The right ventricular  size is normal. Tricuspid regurgitation signal is inadequate for assessing  PA pressure.   3. Small to moderate pericardial effusion. The pericardial effusion is  circumferential. There is no evidence of cardiac tamponade.   4. The mitral valve is abnormal. Mild mitral valve regurgitation. No  evidence of mitral stenosis.   5. The aortic valve is tricuspid. Aortic valve regurgitation is not  visualized. No aortic stenosis is present.   6. The inferior vena cava is normal in size with <50% respiratory  variability, suggesting right atrial pressure of 8 mmHg.   FINDINGS   Left Ventricle: Left ventricular ejection fraction, by estimation, is 40  to 45%. The left ventricle has mildly decreased function. The left  ventricle demonstrates global hypokinesis. The left ventricular internal  cavity size was normal in size. There is   mild left ventricular hypertrophy. Left ventricular diastolic parameters  are indeterminate.   Right Ventricle: The right ventricular size is normal. Right vetricular  wall thickness was not well visualized. Right ventricular systolic  function is normal. Tricuspid regurgitation signal is inadequate for  assessing PA pressure.   Left Atrium: Left atrial size was normal in size.   Right Atrium: Right atrial size was normal in size.   Pericardium: Small to moderate pericardial effusion. The pericardial  effusion is circumferential. There is no evidence of cardiac tamponade.   Mitral Valve: The mitral valve is  abnormal. Mild mitral valve  regurgitation. No evidence of mitral valve stenosis.  Tricuspid Valve: The tricuspid valve is normal in structure. Tricuspid  valve regurgitation is not demonstrated. No evidence of tricuspid  stenosis.   Aortic Valve: The aortic valve is tricuspid. Aortic valve regurgitation is  not visualized. No aortic stenosis is present. Aortic valve mean gradient  measures 6.1 mmHg. Aortic valve peak gradient measures 9.5 mmHg. Aortic  valve area, by VTI measures 1.85  cm.   Pulmonic Valve: The pulmonic valve was not well visualized. Pulmonic valve  regurgitation is not visualized. No evidence of pulmonic stenosis.   Aorta: The aortic root is normal in size and structure.   Venous: The inferior vena cava is normal in size with less than 50%  respiratory variability, suggesting right atrial pressure of 8 mmHg.   IAS/Shunts: The interatrial septum was not well visualized.       Laboratory Data:  High Sensitivity Troponin:   Recent Labs  Lab 03/02/22 1608 03/02/22 1706 03/02/22 2150 03/02/22 2330 03/03/22 0410  TROPONINIHS 269* 204* 560* 565* 515*     Chemistry Recent Labs  Lab 03/02/22 0431 03/03/22 0402  NA 138 138  K 5.4* 5.4*  CL 100 103  CO2 19* 17*  GLUCOSE 74 72  BUN 75* 87*  CREATININE 17.52* 19.32*  CALCIUM 9.3 8.9  GFRNONAA 3* 3*  ANIONGAP 19* 18*    Recent Labs  Lab 03/02/22 1320  PROT 6.6  ALBUMIN 3.6  AST 25  ALT 26  ALKPHOS 71  BILITOT 0.6   Lipids No results for input(s): "CHOL", "TRIG", "HDL", "LABVLDL", "LDLCALC", "CHOLHDL" in the last 168 hours.  Hematology Recent Labs  Lab 03/02/22 0431 03/03/22 0402  WBC 6.0 7.3  RBC 4.04* 4.33  HGB 12.3* 13.0  HCT 37.2* 39.3  MCV 92.1 90.8  MCH 30.4 30.0  MCHC 33.1 33.1  RDW 13.4 13.4  PLT 212 196   Thyroid  Recent Labs  Lab 03/02/22 1706  TSH 1.169    BNP Recent Labs  Lab 03/02/22 1706  BNP 1,648.3*    DDimer No results for input(s): "DDIMER" in the last  168 hours.   Radiology/Studies:  ECHOCARDIOGRAM COMPLETE  Result Date: 03/03/2022    ECHOCARDIOGRAM REPORT   Patient Name:   [facility] Date of Exam: 03/02/2022 Medical Rec #:  [id number]       Height:       68.0 in Accession #:    [id number]      Weight:       155.4 lb Date of Birth:  [birth year]       BSA:          1.836 m Patient Age:    36 years        BP:           170/124 mmHg Patient Gender: M               HR:           112 bpm. Exam Location:  Inpatient Procedure: 2D Echo, Color Doppler and Cardiac Doppler Indications:    Chest pain. SOB. ESRD, recently missed dialysis. Hypertension,                 tobacco abuse.  History:        Patient has prior history of Echocardiogram examinations, most                 recent 08/25/2017.  Sonographer:    Johny Chess RDCS Referring Phys: [id number] [facility] PFEIFFER  Sonographer Comments: Image  acquisition challenging due to uncooperative patient. Prior Echo done 08/25/17 IMPRESSIONS  1. Left ventricular ejection fraction, by estimation, is 40 to 45%. The left ventricle has mildly decreased function. The left ventricle demonstrates global hypokinesis. There is mild left ventricular hypertrophy. Left ventricular diastolic parameters are indeterminate.  2. Right ventricular systolic function is normal. The right ventricular size is normal. Tricuspid regurgitation signal is inadequate for assessing PA pressure.  3. Small to moderate pericardial effusion. The pericardial effusion is circumferential. There is no evidence of cardiac tamponade.  4. The mitral valve is abnormal. Mild mitral valve regurgitation. No evidence of mitral stenosis.  5. The aortic valve is tricuspid. Aortic valve regurgitation is not visualized. No aortic stenosis is present.  6. The inferior vena cava is normal in size with <50% respiratory variability, suggesting right atrial pressure of 8 mmHg. FINDINGS  Left Ventricle: Left ventricular ejection fraction, by estimation, is 40 to 45%. The  left ventricle has mildly decreased function. The left ventricle demonstrates global hypokinesis. The left ventricular internal cavity size was normal in size. There is  mild left ventricular hypertrophy. Left ventricular diastolic parameters are indeterminate. Right Ventricle: The right ventricular size is normal. Right vetricular wall thickness was not well visualized. Right ventricular systolic function is normal. Tricuspid regurgitation signal is inadequate for assessing PA pressure. Left Atrium: Left atrial size was normal in size. Right Atrium: Right atrial size was normal in size. Pericardium: Small to moderate pericardial effusion. The pericardial effusion is circumferential. There is no evidence of cardiac tamponade. Mitral Valve: The mitral valve is abnormal. Mild mitral valve regurgitation. No evidence of mitral valve stenosis. Tricuspid Valve: The tricuspid valve is normal in structure. Tricuspid valve regurgitation is not demonstrated. No evidence of tricuspid stenosis. Aortic Valve: The aortic valve is tricuspid. Aortic valve regurgitation is not visualized. No aortic stenosis is present. Aortic valve mean gradient measures 6.1 mmHg. Aortic valve peak gradient measures 9.5 mmHg. Aortic valve area, by VTI measures 1.85 cm. Pulmonic Valve: The pulmonic valve was not well visualized. Pulmonic valve regurgitation is not visualized. No evidence of pulmonic stenosis. Aorta: The aortic root is normal in size and structure. Venous: The inferior vena cava is normal in size with less than 50% respiratory variability, suggesting right atrial pressure of 8 mmHg. IAS/Shunts: The interatrial septum was not well visualized.  LEFT VENTRICLE PLAX 2D LVIDd:         5.50 cm      Diastology LVIDs:         4.50 cm      LV e' medial:  9.25 cm/s LV PW:         1.20 cm      LV e' lateral: 15.20 cm/s LV IVS:        1.10 cm LVOT diam:     1.90 cm LV SV:         51 LV SV Index:   28 LVOT Area:     2.84 cm  LV Volumes (MOD) LV  vol d, MOD A4C: 135.0 ml LV vol s, MOD A4C: 76.5 ml LV SV MOD A4C:     135.0 ml IVC IVC diam: 1.80 cm LEFT ATRIUM             Index        RIGHT ATRIUM           Index LA diam:        4.30 cm 2.34 cm/m   RA Area:  12.60 cm LA Vol (A2C):   59.0 ml 32.13 ml/m  RA Volume:   29.30 ml  15.96 ml/m LA Vol (A4C):   54.5 ml 29.68 ml/m LA Biplane Vol: 56.8 ml 30.94 ml/m  AORTIC VALVE AV Area (Vmax):    2.17 cm AV Area (Vmean):   1.78 cm AV Area (VTI):     1.85 cm AV Vmax:           153.84 cm/s AV Vmean:          117.768 cm/s AV VTI:            0.274 m AV Peak Grad:      9.5 mmHg AV Mean Grad:      6.1 mmHg LVOT Vmax:         118.00 cm/s LVOT Vmean:        74.100 cm/s LVOT VTI:          0.179 m LVOT/AV VTI ratio: 0.65  AORTA Ao Root diam: 3.10 cm Ao Asc diam:  3.40 cm  SHUNTS Systemic VTI:  0.18 m Systemic Diam: 1.90 cm Carlyle Dolly MD Electronically signed by Carlyle Dolly MD Signature Date/Time: 03/03/2022/1:55:20 PM    Final    CT Angio Chest PE W/Cm &/Or Wo Cm  Result Date: 03/02/2022 CLINICAL DATA:  Chest pain. Pulmonary embolism suspected. Unknown D-dimer. Abnormal chest x-ray EXAM: CT ANGIOGRAPHY CHEST WITH CONTRAST TECHNIQUE: Multidetector CT imaging of the chest was performed using the standard protocol during bolus administration of intravenous contrast. Multiplanar CT image reconstructions and MIPs were obtained to evaluate the vascular anatomy. RADIATION DOSE REDUCTION: This exam was performed according to the departmental dose-optimization program which includes automated exposure control, adjustment of the mA and/or kV according to patient size and/or use of iterative reconstruction technique. CONTRAST:  65mL OMNIPAQUE IOHEXOL 350 MG/ML SOLN COMPARISON:  Two-view chest x-ray 03/02/2022. CT of the chest 07/16/2017 FINDINGS: Cardiovascular: Heart is normal in size. A small pericardial effusion is present. Aorta and great vessel origins are within normal limits. Pulmonary artery opacification  is excellent. No focal filling defects are present to suggest pulmonary emboli. Pulmonary artery size is normal. Mediastinum/Nodes: Prominent perihilar lymph tissue present bilaterally. No discrete mediastinal nodes are present. Significant axillary adenopathy is present. Lungs/Pleura: Bilateral diffuse lower lobe ground-glass airspace opacity is present. Patchy airspace opacities are present in the inferior lingula and right middle lobe well as the inferior right upper lobe. Airways are patent. Small right pleural effusion is present. Upper Abdomen: Limited imaging the abdomen is unremarkable. There is no significant adenopathy. No solid organ lesions are present. Musculoskeletal: Vertebral body heights and alignment are normal. No focal osseous lesions are present. Chest wall lesion is present. Ribs are unremarkable. Review of the MIP images confirms the above findings. IMPRESSION: 1. No pulmonary embolus. 2. Bilateral diffuse lower lobe ground-glass airspace opacity with patchy airspace opacities in the inferior lingula and right middle lobe as well as the inferior right upper lobe. Findings are most concerning for multifocal pneumonia. Differential diagnosis includes edema or hemorrhage. 3. Small right pleural effusion. 4. Prominent perihilar lymph tissue bilaterally is likely reactive. 5. Pericardial effusion likely inflammatory. Electronically Signed   By: San Morelle M.D.   On: 03/02/2022 18:25   DG Chest 2 View  Result Date: 03/02/2022 CLINICAL DATA:  Chest pain.  End-stage renal disease EXAM: CHEST - 2 VIEW COMPARISON:  01/05/2022 FINDINGS: Asymmetric opacity at the right lung base. Low volume chest with interstitial coarsening but no [employer]  or effusion. Normal heart size. IMPRESSION: 1. Opacity at the right base, asymmetry suggesting pneumonia. 2. Low volume chest. Electronically Signed   By: Jorje Guild M.D.   On: 03/02/2022 05:06     Assessment and Plan:   [name] is  a 36 y.o. male with a hx of ESRD on HD, HTN, GERD, Bipolar and congential hearing loss who is being seen 03/03/2022 for the evaluation of chest pain/elevated troponin at the request of Dr. Owens Shark.  Chest pain Elevated Troponin -- in the setting of missed HD session with volume overload. Pleuritic in nature. HsTn  85>> 163>> 269>> 560>>515. EKG showed sinus tachycardia with LVH. He initially complained of chest pain but now resolved after HD session. Echo showed LVEF of 40-45% with global hypokinesis, LVH. Suspect mildly reduced EF related to uncontrolled HTN.  ESRD on HD -- volume overloaded on admission after missing HD session on Saturday -- per nephrology  PNA -- CXR concerning for right sided PNA -- has been on antibiotics per primary  HFmrEF -- Echo showed LVEF of 40-45% with global hypokinesis, normal RV size and function -- volume management per HD -- as above resume coreg 6.25mg  BID, along with hydralazine 25mg  TID  HTN -- reports compliance with his medications, but significantly elevated blood pressures -- on amlodipine 10mg  daily -- resume coreg 6.25mg  BID, add hydralazine 25mg  TID  Small to moderate pericardial effusion -- noted on echo, circumferential. no evidence of cardiac tamponade.   Hyperkalemia -- K+ 5.4 -- s/p lokelma, HD -- follow BMET  For questions or updates, please contact [facility] Please consult [email or web address] for contact info under    Signed, Reino Bellis, NP  03/03/2022 3:28 PM    Patient seen and examined. Agree with assessment and plan.  Patient was seen in the ER following initial evaluation noted by Reino Bellis, NP above.  He continued to be significantly hypertensive with blood pressure 175/110 and tachycardic.  The patient has been on dialysis for 3 years.  He has recently reduced dialysis days to 2 days/week when he is working but otherwise goes to dialysis 3 days/week.  Patient's last dialysis was last Thursday.  Upon  presentation he was significantly volume overloaded and underwent successful dialysis today.  He continues to be tachycardic and also experiences headache probably contributed by his increased blood pressure and tachycardia.  He has history of congenital deafness but reads lips appropriately.  On exam he is in no acute distress but notes headache.  JVD slightly increased.  Breath sounds are slightly decreased.  He is tachycardic with S4 gallop.  Abdomen nontender with positive bowel sounds.  No significant edema.  Oratory is notable for elevated high-sensitivity troponin suggestive of demand ischemia rather than ACS.  BNP increased at 1648 consistent with volume overload.  ECG shows sinus tachycardia with significant left ventricular hypertrophy.  Chest x-ray revealed low volume with opacity at the right base with asymmetry suggesting possible pneumonia.  At present, he has been on amlodipine 10 mg daily.  Will resume carvedilol initially at 6.25 mg twice a day but I suspect this will need to be further titrated to at least 12.5 twice daily or 25 mg twice a day depending upon heart rate and blood pressure.  We will initially add hydralazine 25 mg 3 times daily and titrate as blood pressure allows. Echo reveals Doose LV function with EF 40 to 45% with global hypokinesis, left ventricular hypertrophy, and at least small to moderate pericardial effusion.  The has been evaluated by nephrology and primary care also plans to obtain head CT with ongoing headache.  Will follow.  [name] Sine, MD, [facility] 03/03/2022 4:45 pm

## 2022-03-03 NOTE — Procedures (Addendum)
HD Note:  Some information was entered later than the data was gathered due to patient care needs. The stated time with the data is accurate.   Received patient in bed to unit.  Alert and oriented.  Informed consent signed and in chart.    Patient had headache and stomach pain  half way through the treatment.  See MAR.  UF was held for 10 min with resolution to stomach pain. Patient had a headache that progressed to eye pain.  Tylenol was given.  It was ineffective. Patient stated that is all he uses   Transported back to the room   Hand-off given to patient's nurse.   Access used: Right AVF Access issues: None- Fistula has no drainage, thrill and Bruit are present.  Total UF removed: 2500.  Patient was unable to tolerate further fluid removal related to physical complaints of pain.  [facility]

## 2022-03-03 NOTE — Progress Notes (Signed)
FMTS Brief Progress Note  S:Called to patient room regarding reported significant jaw and head pain. In the room, patient has several family members and is bent over the end of the bed. He states that if he lays back he feels like he is suffocating and feels like he cant' breath. He is reporting bilateral jaw pain that radiates up to his head. He has been having headaches but feels like this is worse. It is unclear if there is something that triggered this episode, per family he was calm and dozing off not too long before that and then had this episode soon after having his blood drawn then was having pain all over his body and this abnormal breathing concern.  Per one family member that was on the phone, patient has had anxiety attacks before and they are wondering if that could be contributing to this.    O: BP (!) 162/109   Pulse (!) 102   Temp 98.3 F (36.8 C)   Resp 20   Wt 70.1 kg   SpO2 99%   BMI 23.50 kg/m   General: patient appears in distress, family members at bedside CV: sinus tachycardia, no murmur appreciated Respiratory: bent over stating he cannot breath but is satting at 100% O2 with no supplemental oxygen, normal respiratory rate, and CTAB Neuro: A&O x3, PERRLA, EOMI, normal sensation in BUE and BLE. No focal neurologic deficits noted.   A/P: Shortness of breath  Jaw pain Unclear picture as to the onset of the patient's pain and shortness of breath. Consideration for pulmonary embolus (though unlikely given negative CT PE scan yesterday), anxiety, or intracranial processes (hx of headaches that are worsening and HTN). Clinical picture has not changed much with stable troponin that down-trended and sinus tachycardia on telemetry and EKG. Lower suspicion for a cardiac etiology at this time and currently trending troponin level with telemetry unchanged. - Monitor respiratory status - Trial of hydroxyzine 25mg  once - CT head WO contrast - No CTA PE study as just done  yesterday  - If everything negative, consider Ativan or other anxiety medication    Lennard Capek, Lorrin Goodell, DO 03/03/2022, 9:29 PM PGY-3, [facility] Night Resident  Please page [phone] with questions.

## 2022-03-03 NOTE — ED Notes (Signed)
Pt able to tolerate and swallow liquids and PO meds. States that sometimes at home he has trouble swallowing his food and has to wash it down with water to get it all down

## 2022-03-03 NOTE — Progress Notes (Signed)
Spoke with Dr. Debara Pickett Cardiology about patient's troponin being elevated to 900. Informed that Dr. Claiborne Billings saw this patient, and will update with his recs. No CP currently, no current interventions. EKG pending.

## 2022-03-03 NOTE — Hospital Course (Addendum)
[name] is a 36 y.o.male presenting with shortness of breath and chest pain in setting of ESRD a.m. with missed HD, as well as concern for pneumonia. His hospital course is outlined below.  Dyspnea likely 2/2 volume overload in setting of missed HD, PNA Presented with dyspnea after missing HD session. Intermittently required 2L [place] but was weaned to RA. COVID-19 negative. Found to have opacities on CXR/CT consistent with community-acquired pneumonia, also possible fluid retention secondary to ESRD (see below). CTA negative for PE.  IV ceftriaxone and azithromycin started and eventually transitioned to PO cefdinir and azithromycin. With abx and HD, patient's dyspnea markedly improved.  Chest pain likely 2/2 demand ischemia, anxiety Present on admission. Troponins elevated to 900. EKG unremarkable. CXR and CT as above. CTA PE negative. Cardiology assessed with no immediate intervention. Echo obtained which showed EF 40-45% with global hypokinesis, LVH, and small-moderate pericardial effusion. Multiple additional episodes of chest pain occurred; repeat EKG unremarkable. Of note, once distracted from the feelings of SOB and CP, he was no longer in distress. In addition, hydroxyzine 25 mg proved beneficial. By discharge, he no longer had chest pain.  Other chronic conditions stable at time of discharge HTN - started on home amlodipine but restarted on home coreg given pressures Hyperkalemia - on HD, given lokelma during admission  PCP Follow-up Recommendations: Repeat CXR in 4-6 weeks Follow up respiratory status Follow up episodes of chest pain - consider referral to therapist Follow up headaches - suspect migraines or related to severe HTN; CT head negative Recommend GI referral, possible esophagram for dysphagia noted by SLP

## 2022-03-03 NOTE — ED Notes (Signed)
Pt still c/o 10/10 HA and jaw pain. MD paged. Consulting physician at bedside to discuss plan of care with patient and family.

## 2022-03-03 NOTE — ED Notes (Signed)
Patient reports headache not improving after tylenol. MD paged and notified.

## 2022-03-03 NOTE — Progress Notes (Signed)
[facility] Progress Note  Name: [name] MRN: [id number] DOB: 08/23/1985  Chief Complaint:  Chest pain   Subjective:  still obs.  He has been on HD this am and currently out of UF due to some stomach discomfort - getting put back in soon.   Review of systems:  Chronically reduced hearing - reads lips  Shortness of breath  Hx chest pain as below  -------------- Background 36 year old ESRD patient presents with chest pain.  Imaging concerning for pneumonia.  Also undergoing work-up for possible pulmonary embolism.  Patient missed dialysis on Saturday and had shortened treatments on Tuesday and Thursday.  Likely has a component of volume overload but not extensive.  Blood pressures fairly elevated but this is consistent with outpatient blood pressures.   No intake or output data in the 24 hours ending 03/03/22 0936  Vitals:  Vitals:   03/03/22 0809 03/03/22 0830 03/03/22 0900 03/03/22 0930  BP: (!) 185/112 (!) 177/114 (!) 173/124 (!) 167/122  Pulse: (!) 102 (!) 107 96 (!) 108  Resp: 19 18 20 19   Temp:      TempSrc:      SpO2: 100% 98% 100% 99%     Physical Exam:  General adult male in bed in no acute distress HEENT normocephalic atraumatic extraocular movements intact sclera anicteric Neck supple trachea midline Lungs clear but reduced to auscultation bilaterally (greater right) normal work of breathing at rest  Heart S1S2 no rub Abdomen soft nontender nondistended Extremities no edema  Psych normal mood and affect Neuro - alert and oriented and conversant; hard of hearing - reads lips Access RUE AVF in use  Medications reviewed   Labs:     Latest Ref Rng & Units 03/03/2022    4:02 AM 03/02/2022    4:31 AM 01/19/2022    6:29 AM  BMP  Glucose 70 - 99 mg/dL 72  74  73   BUN 6 - 20 mg/dL 87  75  39   Creatinine 0.61 - 1.24 mg/dL 19.32  17.52  11.18   Sodium 135 - 145 mmol/L 138  138  136   Potassium 3.5 - 5.1 mmol/L 5.4  5.4  4.5   Chloride 98  - 111 mmol/L 103  100  94   CO2 22 - 32 mmol/L 17  19  29    Calcium 8.9 - 10.3 mg/dL 8.9  9.3  8.9      Assessment/Plan:   Chest pain  - per primary team   ESRD  - HD today   HTN  - optimize volume with HD; some overload as shortening HD treatments   Anemia CKD  - no ESA indicated   Dispo - he is obs.  Disposition per primary team   Claudia Desanctis, MD 03/03/2022 9:46 AM

## 2022-03-03 NOTE — ED Notes (Signed)
Patient transported to Dialysis

## 2022-03-03 NOTE — Progress Notes (Addendum)
     Daily Progress Note Intern Pager: [phone]  Patient name: [name] Medical record number: [id number] Date of birth: 02/23/1986 Age: 36 y.o. Gender: male  Primary Care Provider: [name], DO Consultants: Nephrology Code Status: Full  Pt Overview and Major Events to Date:  10/22: Admitted  Assessment and Plan: [name] is a 36 year old male presenting with shortness of breath and chest pain in setting of ESRD with missed HD, as well as concern for pneumonia.  Differential diagnosis management as below.  * Dyspnea Breathing comfortably on 2 L [place], reports improvement of dyspnea.  Currently receiving HD.  No PE on CTA. Multifocal right lobe pneumonia on CT.  Likely secondary to fluid overload and pneumonia. We will stepdown antibiotics today. - Renally dosed cefdinir (10/23- 10/26) - Azithromycin (10/23- 10/24) -Wean oxygen as tolerated  Chest pain Chest pain resolved.  Troponins plateaued.  BNP and TSH WNL.  Echo completed to assess heart function, awaiting official read. - Low threshold for repeat troponin/ECG - Cardiac monitoring - Follow-up on echo  ESRD (end stage renal disease) ([name]) Receiving dialysis today. -Nephrology consulted, appreciate recommendations -Home PhosLo -Avoid nephrotoxic medications  Hyperkalemia S/p Lokelma.  Receiving dialysis. - Follow-up BMP  Hypertension  Pressures remain elevated. -P.o. hydralazine every 8 hours x3 -Continue home amlodipine   Chronic medical conditions, stable Hearing loss: Patient heard of hearing able to read lips.  Appropriately responsive.  Documented history of [place] syndrome. Tobacco use: Nicotine patch   FEN/GI: Renal diet PPx: Subcu heparin Dispo: Pending clinical improvement  Subjective:  I evaluated patient in HD unit.  He is awake and alert, no acute concerns at this moment.  Reports that he is breathing better.  Objective: Temp:  [97.5 F (36.4 C)-98.8 F (37.1 C)] 97.5 F  (36.4 C) (10/23 0755) Pulse Rate:  [82-121] 111 (10/23 1130) Resp:  [14-26] 18 (10/23 1130) BP: (157-185)/(74-126) 172/114 (10/23 1130) SpO2:  [92 %-100 %] 100 % (10/23 1130) Weight:  [73 kg] 73 kg (10/23 0948) Physical Exam: General: Not in acute distress, resting comfortably during HD Cardiovascular: Tachycardic, no murmurs Respiratory: CTAB, normal work of breathing on 2 LNC Abdomen: Soft, not tender Extremities: Mild bilateral lower extremity edema  Laboratory: Most recent CBC Lab Results  Component Value Date   WBC 7.3 03/03/2022   HGB 13.0 03/03/2022   HCT 39.3 03/03/2022   MCV 90.8 03/03/2022   PLT 196 03/03/2022   Most recent BMP    Latest Ref Rng & Units 03/03/2022    4:02 AM  BMP  Glucose 70 - 99 mg/dL 72   BUN 6 - 20 mg/dL 87   Creatinine 0.61 - 1.24 mg/dL 19.32   Sodium 135 - 145 mmol/L 138   Potassium 3.5 - 5.1 mmol/L 5.4   Chloride 98 - 111 mmol/L 103   CO2 22 - 32 mmol/L 17   Calcium 8.9 - 10.3 mg/dL 8.9    Trops: 025>852>778  Edward Dales, DO 03/03/2022, 12:02 PM  PGY-1, [place] Intern pager: [phone], text pages welcome Secure chat group [name]

## 2022-03-03 NOTE — ED Notes (Signed)
Report given to hemodialysis nurse on patient's status , advised me  that she will arrange transport to hemodialysis unit.

## 2022-03-04 ENCOUNTER — Emergency Department (HOSPITAL_COMMUNITY): Payer: Medicare Other

## 2022-03-04 ENCOUNTER — Other Ambulatory Visit (HOSPITAL_COMMUNITY): Payer: Self-pay

## 2022-03-04 DIAGNOSIS — N186 End stage renal disease: Secondary | ICD-10-CM | POA: Diagnosis not present

## 2022-03-04 DIAGNOSIS — Z992 Dependence on renal dialysis: Secondary | ICD-10-CM

## 2022-03-04 DIAGNOSIS — J189 Pneumonia, unspecified organism: Secondary | ICD-10-CM | POA: Diagnosis not present

## 2022-03-04 LAB — BASIC METABOLIC PANEL
Anion gap: 15 (ref 5–15)
BUN: 52 mg/dL — ABNORMAL HIGH (ref 6–20)
CO2: 24 mmol/L (ref 22–32)
Calcium: 8.9 mg/dL (ref 8.9–10.3)
Chloride: 98 mmol/L (ref 98–111)
Creatinine, Ser: 13 mg/dL — ABNORMAL HIGH (ref 0.61–1.24)
GFR, Estimated: 5 mL/min — ABNORMAL LOW (ref >60)
Glucose, Bld: 132 mg/dL — ABNORMAL HIGH (ref 70–99)
Potassium: 4.1 mmol/L (ref 3.5–5.1)
Sodium: 137 mmol/L (ref 135–145)

## 2022-03-04 LAB — MAGNESIUM: Magnesium: 2.2 mg/dL (ref 1.7–2.4)

## 2022-03-04 LAB — CBC
HCT: 39.7 % (ref 39.0–52.0)
Hemoglobin: 13 g/dL (ref 13.0–17.0)
MCH: 30.2 pg (ref 26.0–34.0)
MCHC: 32.7 g/dL (ref 30.0–36.0)
MCV: 92.3 fL (ref 80.0–100.0)
Platelets: 191 10*3/uL (ref 150–400)
RBC: 4.3 MIL/uL (ref 4.22–5.81)
RDW: 13.6 % (ref 11.5–15.5)
WBC: 3.9 10*3/uL — ABNORMAL LOW (ref 4.0–10.5)
nRBC: 0 % (ref 0.0–0.2)

## 2022-03-04 LAB — TROPONIN I (HIGH SENSITIVITY)
Troponin I (High Sensitivity): 603 ng/L (ref <18)
Troponin I (High Sensitivity): 483 ng/L (ref <18)
Troponin I (High Sensitivity): 352 ng/L (ref <18)

## 2022-03-04 MED ORDER — CARVEDILOL 12.5 MG PO TABS
12.5000 mg | ORAL_TABLET | Freq: Two times a day (BID) | ORAL | 0 refills | Status: DC
  Filled 2022-03-04: qty 30, 15d supply, fill #0

## 2022-03-04 MED ORDER — CHLORHEXIDINE GLUCONATE CLOTH 2 % EX PADS
6.0000 | MEDICATED_PAD | Freq: Every day | CUTANEOUS | Status: DC
  Administered 2022-03-05: 6 via TOPICAL

## 2022-03-04 MED ORDER — CARVEDILOL 12.5 MG PO TABS
12.5000 mg | ORAL_TABLET | Freq: Two times a day (BID) | ORAL | Status: DC
  Administered 2022-03-05: 12.5 mg via ORAL
  Filled 2022-03-04 (×2): qty 1

## 2022-03-04 MED ORDER — HYDRALAZINE HCL 25 MG PO TABS
25.0000 mg | ORAL_TABLET | Freq: Three times a day (TID) | ORAL | 0 refills | Status: DC
  Filled 2022-03-04: qty 30, 10d supply, fill #0

## 2022-03-04 MED ORDER — HYDROXYZINE HCL 25 MG PO TABS
25.0000 mg | ORAL_TABLET | Freq: Once | ORAL | Status: AC
  Administered 2022-03-04: 25 mg via ORAL
  Filled 2022-03-04: qty 1

## 2022-03-04 MED ORDER — CARVEDILOL 6.25 MG PO TABS
6.2500 mg | ORAL_TABLET | Freq: Once | ORAL | Status: AC
  Administered 2022-03-04: 6.25 mg via ORAL
  Filled 2022-03-04: qty 1

## 2022-03-04 MED ORDER — CEFDINIR 300 MG PO CAPS
300.0000 mg | ORAL_CAPSULE | Freq: Once | ORAL | Status: AC
  Administered 2022-03-04: 300 mg via ORAL
  Filled 2022-03-04: qty 1

## 2022-03-04 MED ORDER — LORAZEPAM 2 MG/ML IJ SOLN
0.5000 mg | Freq: Once | INTRAMUSCULAR | Status: AC
  Administered 2022-03-04: 0.5 mg via INTRAVENOUS
  Filled 2022-03-04: qty 1

## 2022-03-04 NOTE — Progress Notes (Signed)
Received patient in bed to unit.  Alert and oriented.  Informed consent signed and in chart.   Treatment initiated: 1630 Treatment completed: [place]  Patient tolerated well.  Transported back to the room  Alert, without acute distress.  Hand-off given to patient's nurse.   Access used: AVF Access issues: none  Total UF removed: 1.6L Medication(s) given: n/a Post HD VS: 156/109,106,22,99% Post HD weight: 69.4kg   [facility]

## 2022-03-04 NOTE — ED Notes (Signed)
Pt sitting up eating breakfast. New gown provided per pt request.

## 2022-03-04 NOTE — ED Notes (Signed)
Patient transported to CT 

## 2022-03-04 NOTE — ED Notes (Signed)
To CT

## 2022-03-04 NOTE — Progress Notes (Signed)
Patient ambulated around nursing unit on room air. Patient ambulated for 3 minutes on room air and sats maintained 95-100%. No dyspnea noted. Patient denies any discomfort. No s/s of distress noted.

## 2022-03-04 NOTE — Progress Notes (Signed)
[facility] Progress Note  Name: [name] MRN: [id number] DOB: 01/14/1986  Chief Complaint:  Chest pain   Subjective:  Now is inpatient.  Last HD on 10/23 with 2.5 kg UF (got off schedule).  He is still short of breath and his family his concerned about anxiety.  He doesn't know when he is going home as of our conversation.   Goes to [employer].   Review of systems:  Chronically reduced hearing - reads lips  Shortness of breath but better Hx chest pain as below  -------------- Background 36 year old ESRD patient presents with chest pain.  Imaging concerning for pneumonia.  Also undergoing work-up for possible pulmonary embolism.  Patient missed dialysis on Saturday and had shortened treatments on Tuesday and Thursday.  Likely has a component of volume overload but not extensive.  Blood pressures fairly elevated but this is consistent with outpatient blood pressures.   No intake or output data in the 24 hours ending 03/04/22 1335  Vitals:  Vitals:   03/04/22 0930 03/04/22 1000 03/04/22 1043 03/04/22 1116  BP: (!) 186/132 (!) 163/120  (!) 164/117  Pulse:  97  98  Resp:  (!) 21  14  Temp:   98.2 F (36.8 C) 98.2 F (36.8 C)  TempSrc:   Oral Oral  SpO2:  97%  94%  Weight:         Physical Exam:   General adult male in bed in no acute distress HEENT normocephalic atraumatic extraocular movements intact sclera anicteric Neck supple trachea midline Lungs clear but reduced to auscultation unlabored on room air  Heart S1S2 no rub Abdomen soft nontender nondistended Extremities no edema  Psych normal mood and affect Neuro - alert and oriented and conversant; hard of hearing - reads lips Access RUE AVF bruit and thrill   Medications reviewed   Labs:     Latest Ref Rng & Units 03/04/2022    1:10 AM 03/03/2022    4:02 AM 03/02/2022    4:31 AM  BMP  Glucose 70 - 99 mg/dL 132  72  74   BUN 6 - 20 mg/dL 52  87  75   Creatinine 0.61 - 1.24 mg/dL  13.00  19.32  17.52   Sodium 135 - 145 mmol/L 137  138  138   Potassium 3.5 - 5.1 mmol/L 4.1  5.4  5.4   Chloride 98 - 111 mmol/L 98  103  100   CO2 22 - 32 mmol/L 24  17  19    Calcium 8.9 - 10.3 mg/dL 8.9  8.9  9.3      Assessment/Plan:   Chest pain  - per primary team  - imaging concerning for PNA  ESRD  - Usually has HD per TTS schedule; last had HD off schedule yesterday after shortened preceding treatments leading to volume overload    - Short treatment today to get back on schedule and optimize respiratory status  HTN  - optimize volume with HD; some overload as shortening HD treatments   Anemia CKD  - no ESA indicated   Dispo - Disposition per primary team.  From my standpoint ok for home after HD   Claudia Desanctis, MD 03/04/2022 1:35 PM

## 2022-03-04 NOTE — ED Notes (Addendum)
Patient unable to complete CT scan. Patient is anxious and agitated at this time. MD made aware of situation. Awaiting new orders at this time

## 2022-03-04 NOTE — Progress Notes (Signed)
Heart Failure Navigator Progress Note  Assessed for Heart & Vascular TOC clinic readiness.  Patient does not meet criteria due to ESRD patient on dialysis.     Earnestine Leys, BSN, Clinical cytogeneticist Only

## 2022-03-04 NOTE — Progress Notes (Addendum)
     Daily Progress Note Intern Pager: [phone]  Patient name: [name] Medical record number: [id number] Date of birth: Oct 29, 1985 Age: 36 y.o. Gender: male  Primary Care Provider: [name], DO Consultants: Nephrology Code Status: Full   Pt Overview and Major Events to Date:  10/22: Admitted   Assessment and Plan: [name] is a 35 year old male presenting with shortness of breath and chest pain in setting of ESRD with missed HD, as well as concern for pneumonia.  Differential diagnosis management as below.  * Dyspnea Stable on RA. Exam unremarkable. Given stability and lower suspicion for PNA, can dc antibiotics. -Amb pulse ox before discharge -Monitor respiratory status  Chest pain Had episode of chest pain overnight that resolved with hydroxyzine; suspect element of anxiety per exam, history, and family report.  Troponins plateaued.  BNP and TSH WNL.  Echo completed to assess heart function with EF 40-45%, global hypokinesis, LVH. Cardiology following without intervention at this time. - Low threshold for repeat troponin/ECG - Cardiac monitoring - CT head obtained for associated headache with chest pain; read negative  ESRD (end stage renal disease) on dialysis [facility]) Has received dialysis over admission with improvement in clinical status. -Nephrology consulted, appreciate recommendations -Home PhosLo -Avoid nephrotoxic medications  Hyperkalemia S/p Lokelma.  Receiving dialysis. - Follow-up BMP  Hypertension  Pressures remain elevated. -P.o. hydralazine every 8 hours x3 -Continue home amlodipine -Increase coreg to 12.5 mg BID   FEN/GI: Renal diet PPx: Subcu heparin Dispo: Likely home today if no HD needs  Subjective:  Doing well this morning. Has no complaints. Is eager to get home for his son's birthday party coming up.  Objective: Temp:  [98 F (36.7 C)-98.3 F (36.8 C)] 98.2 F (36.8 C) (10/24 1116) Pulse Rate:  [93-118] 98  (10/24 1116) Resp:  [10-27] 14 (10/24 1116) BP: (151-186)/(92-132) 164/117 (10/24 1116) SpO2:  [93 %-100 %] 94 % (10/24 1116) Physical Exam: General: Alert and oriented, in NAD Skin: Warm, dry HEENT: NCAT, EOM grossly normal, midline nasal septum Cardiac: RRR, no m/r/g appreciated Respiratory: CTAB, breathing and speaking comfortably on RA Abdominal: Nondistended Extremities: Moves all extremities grossly equally in bed Neurological: No gross focal deficit Psychiatric: Appropriate mood and affect   Laboratory: Most recent CBC Lab Results  Component Value Date   WBC 3.9 (L) 03/04/2022   HGB 13.0 03/04/2022   HCT 39.7 03/04/2022   MCV 92.3 03/04/2022   PLT 191 03/04/2022   Most recent BMP    Latest Ref Rng & Units 03/04/2022    1:10 AM  BMP  Glucose 70 - 99 mg/dL 132   BUN 6 - 20 mg/dL 52   Creatinine 0.61 - 1.24 mg/dL 13.00   Sodium 135 - 145 mmol/L 137   Potassium 3.5 - 5.1 mmol/L 4.1   Chloride 98 - 111 mmol/L 98   CO2 22 - 32 mmol/L 24   Calcium 8.9 - 10.3 mg/dL 8.9    Troponins 941>740>814  Imaging/Diagnostic Tests:  CT head wo contrast IMPRESSION: No acute intracranial abnormality noted.  Ethelene Hal, MD 03/04/2022, 1:47 PM PGY-1, [place] Intern pager: [phone], text pages welcome Secure chat group [place]

## 2022-03-04 NOTE — Evaluation (Signed)
Clinical/Bedside Swallow Evaluation Patient Details  Name: [name] MRN: [id number] Date of Birth: 1986/01/18  Today's Date: 03/04/2022 Time: SLP Start Time (ACUTE ONLY): 1013 SLP Stop Time (ACUTE ONLY): 4481 SLP Time Calculation (min) (ACUTE ONLY): 21 min  Past Medical History:  Past Medical History:  Diagnosis Date   Bipolar 1 disorder ([place])    CKD (chronic kidney disease)    on dialysis   Depression    GERD (gastroesophageal reflux disease)    Hearing difficulty of left ear    75% hearing   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   Hypertension    Low blood sugar    Renal disorder    Past Surgical History:  Past Surgical History:  Procedure Laterality Date   APPENDECTOMY     AV FISTULA PLACEMENT Left 03/03/2019   Procedure: ARTERIOVENOUS (AV) FISTULA CREATION LEFT ARM;  Surgeon: Waynetta Sandy, MD;  Location: [facility];  Service: Vascular;  Laterality: Left;   [place] Right 04/12/2019   Procedure: RIGHT UPPER EXTREMITY [place];  Surgeon: Angelia Mould, MD;  Location: [place];  Service: Vascular;  Laterality: Right;   BIOPSY  10/14/2021   Procedure: BIOPSY;  Surgeon: Doran Stabler, MD;  Location: [facility];  Service: Gastroenterology;;   COLONOSCOPY WITH PROPOFOL N/A 10/14/2021   Procedure: COLONOSCOPY WITH PROPOFOL;  Surgeon: Doran Stabler, MD;  Location: [facility];  Service: Gastroenterology;  Laterality: N/A;   LIGATION OF ARTERIOVENOUS  FISTULA Left 03/06/2019   Procedure: LIGATION OF ARTERIOVENOUS  FISTULA;  Surgeon: Marty Heck, MD;  Location[facility] OR;  Service: Vascular;  Laterality: Left;   spinal tap     SPINE SURGERY     related to a spinal infection, unsure of surgery or infection source   WISDOM TOOTH EXTRACTION     HPI:  36 yo with history of HTN, mood disorder, hearing impairment and ESRD(T/TH/S, misses sessions) presenting with dyspnea and chest pain.  CXR with RLL opacity concerning for pneumonia. Pt reports difficulty swallowing food with solids "getting stuck."    Assessment / Plan / Recommendation  Clinical Impression  Pt presents with s/s of a primary esophageal dysphagia. He reports the sensation of "suffocating" on occasion when he eats solid foods, requiring liquids to help wash the material through.  Upon exam, oral mechanism was WNL. He consumed sequential sips of thin water, swallowed puree and crackers with no s/s of an oropharyngeal dysphagia. There were no concerns for aspiration. He belched occasionally; described avoidance of foods like steak/chicken, and affirmed that he has been diagnosed with GERD.    Recommend that he has further esophageal work-up (esophagram) - he could have the testing done while admitted or it could be done as an OP. There are no further recommendations. Continue current diet. D/W pt and his sisters at bedside; pt agrees with recs. SLP will sign off. SLP Visit Diagnosis: Dysphagia, unspecified (R13.10)    Aspiration Risk  No limitations    Diet Recommendation   Renal diet, thin liquids  Medication Administration: Whole meds with liquid    Other  Recommendations Recommended Consults: Consider esophageal assessment Oral Care Recommendations: Oral care BID    Recommendations for follow up therapy are one component of a multi-disciplinary discharge planning process, led by the attending physician.  Recommendations may be updated based on patient status, additional functional criteria and insurance authorization.  Follow up Recommendations No SLP follow up  Swallow Study   General Date of Onset: 03/02/22 HPI: 36 yo with history of HTN, mood disorder, hearing impairment and ESRD(T/TH/S, misses sessions) presenting with dyspnea and chest pain. CXR with RLL opacity concerning for pneumonia. Pt reports difficulty swallowing food with solids "getting stuck." Type of Study: Bedside Swallow  Evaluation Diet Prior to this Study: Regular;Thin liquids Temperature Spikes Noted: No Respiratory Status: Room air History of Recent Intubation: No Behavior/Cognition: Alert;Cooperative;Pleasant mood Oral Cavity Assessment: Within Functional Limits Oral Care Completed by SLP: No Oral Cavity - Dentition: Adequate natural dentition Vision: Functional for self-feeding Self-Feeding Abilities: Able to feed self Patient Positioning: Upright in bed Baseline Vocal Quality: Normal Volitional Cough: Strong Volitional Swallow: Able to elicit    Oral/Motor/Sensory Function Overall Oral Motor/Sensory Function: Within functional limits   Ice Chips Ice chips: Within functional limits   Thin Liquid Thin Liquid: Within functional limits    Nectar Thick Nectar Thick Liquid: Not tested   Honey Thick Honey Thick Liquid: Not tested   Puree Puree: Within functional limits   Solid     Solid: Within functional limits      Juan Quam Laurice 03/04/2022,10:37 AM  Estill Bamberg L. Tivis Ringer, MA CCC/SLP Clinical Specialist - [place] Office number [phone]

## 2022-03-04 NOTE — Progress Notes (Signed)
FMTS Brief Progress Note S:Patient evaluated at bedside, resting comfortably with family present. Patient reports being told by Cardiology that his heart had stopped and that he had received chest compressions, patient was assured there was no known documentation or knowledge of this event from our end. He would like to stay overnight night to clarify his doubts and speak to Dr. Marcha Dutton and Cardiology in the morning. We also discussed an anxiety component to his CV symptoms, he reports his symptoms improved after receiving atarax yesterday.   O: BP (!) 172/118 (BP Location: Left Arm)   Pulse (!) 106   Temp 97.8 F (36.6 C) (Oral)   Resp 20   Wt 69.4 kg   SpO2 99%   BMI 23.26 kg/m   Physical Exam General: NAD, Awake and conversant. Eyes: Normal conjunctiva, anicteric. Round symmetric pupils. ENT:  No nasal discharge. Respiratory: Respirations are non-labored. No wheezing. Skin: Warm. No rashes or ulcers. Psych: Alert and oriented. Cooperative, Appropriate mood and affect, Normal judgment. CV: No lower extremity edema. MSK: Normal ambulation. No clubbing or cyanosis. Neuro: Sensation and CN II-XII grossly normal.   A/P: Chest Pain Patient endorsing chest pain, appears improved from previous night, in no apparent distress. Repeat EKG unchanged, with stable troponin that down-trended. Low suspicion of cardiac etiology, but will continue to monitor overnight.  - Monitor respiratory status - Repeat Troponin overnight - Repeat trial of hydroxyzine 25 mg once - Cardiac telemetry  Christene Slates, MD 03/04/2022, 9:38 PM PGY-1, [place] Night Resident  Please page [phone] with questions.

## 2022-03-04 NOTE — Discharge Instructions (Addendum)
Dear [name],  Thank you for letting us participate in your care. You were hospitalized for increased fluid after missing dialysis with shortness of breath and chest pain. You were treated with dialysis and antibiotics.   POST-HOSPITAL & CARE INSTRUCTIONS Be sure to attend every dialysis session to prevent too much fluid from building up on your body. Monitor your blood pressure at home on your new hydralazine medication and increased carvedilol dose. Continue to also take your amlodipine. Go to your follow up appointments (listed below): If you feel worse, please be sure to let us know or go to the emergency department for further evaluation.  DOCTOR'S APPOINTMENT     Follow-up Information     [name], DO. Call.   Specialty: Family Medicine Why: Call to make an appointment for hospital follow up ASAP. Contact information: [address] [place] [address] [phone]                 Take care and be well!  [facility]  [place], [place] [address] [phone]

## 2022-03-04 NOTE — Discharge Summary (Incomplete)
[facility] Discharge Summary  Patient name: [name] record number: [id number] Date of birth: 05-Jan-1986 Age: 36 y.o. Gender: male Date of Admission: 03/02/2022  Date of Discharge: 03/05/2022 Admitting Physician: Provider Default, MD  Primary Care Provider: [name], DO Consultants: Nephrology  Indication for Hospitalization: Dyspnea with volume overload and PNA  Brief Hospital Course:  [name] is a 36 y.o.male presenting with shortness of breath and chest pain in setting of ESRD a.m. with missed HD, as well as concern for pneumonia. His hospital course is outlined below.  Dyspnea likely 2/2 volume overload in setting of missed H Presented with dyspnea after missing HD session. Intermittently required 2L [place] but was weaned to RA. COVID-19 negative. Found to have opacities on CXR/CT consistent with edema vs. Pneumonia CTA negative for PE.  IV ceftriaxone and azithromycin started but given absence of symptoms, these were discontinued after 48 hours. He was monitored off ABX and continued to improve with volume management with HD. Dyspnea markedly improved after multiple HD sessions.   Chest pain likely 2/2 demand ischemia, anxiety Present on admission. Troponins elevated to 900. EKG unremarkable. CXR and CT as above. CTA PE negative. Cardiology assessed with no immediate intervention. Echo obtained which showed EF 40-45% with global hypokinesis, LVH, and small-moderate pericardial effusion. Multiple additional episodes of chest pain occurred; repeat EKG unremarkable. Of note, once distracted from the feelings of SOB and CP, he was no longer in distress. In addition, hydroxyzine 25 mg proved beneficial. By discharge, he no longer had chest pain. He reports significant anxiety at home and also reports intermittent chest pain on non-HD days. Multiple repeat EKG and troponins were obtained--troponin continued to downtrend and EKG  unchanged.   Other chronic conditions stable at time of discharge HTN - started on home amlodipine but restarted on home coreg, imdur and hydralazine Hyperkalemia - on HD, given lokelma during admission New diagnosis of heart failure with mid-range ejection fraction- started on Imdur, Hydralazine and Coreg.   PCP Follow-up Recommendations: Repeat CXR in 4-6 weeks Follow up respiratory status Needs cardiology follow up for HFmrEF Recommend outpatient therapy for anxiety  Follow up headaches - suspect migraines or related to severe HTN; CT head negative Monitor BP while on hydralazine, coreg, amlodipine and imdur Recommend GI referral, possible esophagram for dysphagia noted by SLP Recommend checking BMP and CBC in 1 week  Discharge Diagnoses/Problem List:  Principal Problem for Admission: Dyspnea with volume overload and PNA Present on Admission:  Dyspnea  Hypertension  Tobacco use  Heart failure with midrange EF  Hearing impairment  Disposition: Home  Discharge Condition: Stable  Discharge Exam:  Blood pressure (!) 161/109, pulse 86, temperature 98.8 F (37.1 C), temperature source Oral, resp. rate (!) 25, weight 69.4 kg, SpO2 95 %. General: Not in acute distress, resting comfortably in bed HEENT: Normocephalic, atraumatic head.  Normal external ear and nose.  EOM intact bilaterally. Cardio: RRR, no MRG Respiratory: CTAB, normal work of breathing on room air Extremities: Moves all 4 extremities, no bilateral lower extremity edema appreciated.  Significant Labs and Imaging:  Recent Labs  Lab 03/04/22 0110  WBC 3.9*  HGB 13.0  HCT 39.7  PLT 191   Recent Labs  Lab 03/04/22 0110  NA 137  K 4.1  CL 98  CO2 24  GLUCOSE 132*  BUN 52*  CREATININE 13.00*  CALCIUM 8.9  MG 2.2   CXR IMPRESSION: 1. Opacity at the right base, asymmetry suggesting  pneumonia. 2. Low volume chest.   CTA PE IMPRESSION: 1. No pulmonary embolus. 2. Bilateral diffuse lower lobe  ground-glass airspace opacity with patchy airspace opacities in the inferior lingula and right middle lobe as well as the inferior right upper lobe. Findings are most concerning for multifocal pneumonia. Differential diagnosis includes edema or hemorrhage. 3. Small right pleural effusion. 4. Prominent perihilar lymph tissue bilaterally is likely reactive. 5. Pericardial effusion likely inflammatory.  CT Head IMPRESSION: No acute intracranial abnormality noted.  TTE IMPRESSIONS   1. Left ventricular ejection fraction, by estimation, is 40 to 45%. The  left ventricle has mildly decreased function. The left ventricle  demonstrates global hypokinesis. There is mild left ventricular  hypertrophy. Left ventricular diastolic parameters  are indeterminate.   2. Right ventricular systolic function is normal. The right ventricular  size is normal. Tricuspid regurgitation signal is inadequate for assessing  PA pressure.   3. Small to moderate pericardial effusion. The pericardial effusion is  circumferential. There is no evidence of cardiac tamponade.   4. The mitral valve is abnormal. Mild mitral valve regurgitation. No  evidence of mitral stenosis.   5. The aortic valve is tricuspid. Aortic valve regurgitation is not  visualized. No aortic stenosis is present.   6. The inferior vena cava is normal in size with <50% respiratory  variability, suggesting right atrial pressure of 8 mmHg.   Discharge Medications:  Allergies as of 03/05/2022       Reactions   Zestril [lisinopril] Swelling   Nsaids Other (See Comments)   "Kidney problems "   Risperdal [risperidone] Other (See Comments)   Unknown reaction         Medication List     STOP taking these medications    dicyclomine 10 MG capsule Commonly known as: BENTYL   triamcinolone ointment 0.1 % Commonly known as: KENALOG       TAKE these medications    acetaminophen 500 MG tablet Commonly known as: TYLENOL Take 500 mg  by mouth every 6 (six) hours as needed for mild pain or moderate pain.   amLODipine 10 MG tablet Commonly known as: NORVASC Take 1 tablet (10 mg total) by mouth daily.   calcitRIOL 0.25 MCG capsule Commonly known as: ROCALTROL Take 0.25 mcg by mouth daily.   calcium acetate 667 MG capsule Commonly known as: PHOSLO Take 1 capsule (667 mg total) by mouth 3 (three) times daily with meals.   carvedilol 25 MG tablet Commonly known as: Coreg Take 1 tablet (25 mg total) by mouth 2 (two) times daily. What changed:  medication strength how much to take   cyclobenzaprine 10 MG tablet Commonly known as: FLEXERIL Take 1 tablet (10 mg total) by mouth 2 (two) times daily as needed for muscle spasms.   hydrALAZINE 25 MG tablet Commonly known as: APRESOLINE Take 1 tablet (25 mg total) by mouth every 8 (eight) hours.   pantoprazole 40 MG tablet Commonly known as: PROTONIX Take 40 mg by mouth daily.        Discharge Instructions: Please refer to Patient Instructions section of EMR for full details.  Patient was counseled important signs and symptoms that should prompt return to medical care, changes in medications, dietary instructions, activity restrictions, and follow up appointments.   Follow-Up Appointments:  Follow-up Information     [name], DO. Call.   Specialty: Family Medicine Why: Call to make an appointment for hospital follow up ASAP. Contact information: [place] [address] [phone]  [name], DO 03/05/2022, 12:25 PM PGY-1, [facility]  Upper Level Addendum:  I have seen and evaluated this patient along with Dr. Nelda Bucks and reviewed the above note, making necessary revisions as appropriate.  I agree with the medical decision making and physical exam as noted above.  Gerrit Heck, MD PGY-2 [facility]

## 2022-03-04 NOTE — Progress Notes (Signed)
FMTS Interim Progress Note  S:Went to bedside to evaluate after report of 7/10 chest pain. He feels like his chest was caving in. He felt SOB and his head hurt on the right side. He also felt dizzy. After speaking with patient and helping him to take deep breaths, he became more calm. I stepped away to order stat EKG, and once I returned, he was resting in bed comfortably eating pizza.  O: BP (!) 164/117 (BP Location: Left Arm)   Pulse 98   Temp 98.2 F (36.8 C) (Oral)   Resp 14   Wt 70.1 kg   SpO2 94%   BMI 23.50 kg/m   General: Alert, appears very anxious, moving around in bed quickly and frequently; upon recheck, eating comfortably in NAD Skin: Warm, dry HEENT: NCAT, EOM grossly normal, midline nasal septum, difficulty hearing Cardiac: Intermittently tachycardic, regular rhythm, no m/r/g appreciated Respiratory: CTAB anteriorly, breathing and speaking comfortably on RA Abdominal: Nondistended Extremities: Moves all extremities grossly equally in bed Neurological: No gross focal deficit  A/P: Chest pain Occurred again after similar episode overnight. Cardiac exam unremarkable. Markedly anxious. According to chart review, he does have episodes of anxiety, and they have thought this could be due to that previously. I do feel this is more anxiety than cardiopulmonary etiology. Cardiology following currently without further management given declining troponins, echo findings. However, given his presentation and comorbidities, will obtain stat EKG. -F/u EKG findings -Monitor respiratory status -Continuous cardiac monitoring -Will give atarax 25 mg once if returns to anxious state after eating lunch  Ethelene Hal, MD 03/04/2022, 12:55 PM PGY-1, [facility] Service pager [phone]

## 2022-03-05 ENCOUNTER — Other Ambulatory Visit (HOSPITAL_COMMUNITY): Payer: Self-pay

## 2022-03-05 DIAGNOSIS — N186 End stage renal disease: Secondary | ICD-10-CM | POA: Diagnosis not present

## 2022-03-05 DIAGNOSIS — Z992 Dependence on renal dialysis: Secondary | ICD-10-CM | POA: Diagnosis not present

## 2022-03-05 LAB — TROPONIN I (HIGH SENSITIVITY): Troponin I (High Sensitivity): 176 ng/L (ref <18)

## 2022-03-05 MED ORDER — CARVEDILOL 25 MG PO TABS
25.0000 mg | ORAL_TABLET | Freq: Two times a day (BID) | ORAL | 0 refills | Status: DC
  Filled 2022-03-05: qty 60, 30d supply, fill #0

## 2022-03-05 MED ORDER — ISOSORBIDE MONONITRATE ER 30 MG PO TB24
30.0000 mg | ORAL_TABLET | Freq: Every day | ORAL | Status: DC
  Administered 2022-03-05: 30 mg via ORAL
  Filled 2022-03-05: qty 1

## 2022-03-05 MED ORDER — ISOSORBIDE MONONITRATE ER 30 MG PO TB24
30.0000 mg | ORAL_TABLET | Freq: Every day | ORAL | 0 refills | Status: DC
  Filled 2022-03-05: qty 30, 30d supply, fill #0

## 2022-03-05 NOTE — Progress Notes (Signed)
Pt stated he was having CP that starting radiating to his R jaw. He describes it as sharp, pressure, and feels hot and nauseated, RN did a STAT EKG. MD aware and will be coming to bedside  03/05/22 1130  Vitals  BP (!) 161/109  MAP (mmHg) 123  BP Location Left Arm  BP Method Automatic  Patient Position (if appropriate) Sitting  Pulse Rate 86  Pulse Rate Source Monitor  ECG Heart Rate 96  Resp (!) 25  MEWS COLOR  MEWS Score Color Green  Oxygen Therapy  SpO2 95 %  Pain Assessment  Pain Scale 0-10  Pain Score 10  Pain Type Acute pain  Pain Location Chest  Pain Orientation Mid  Pain Descriptors / Indicators Pressure;Sharp;Shooting  Pain Intervention(s) MD notified (Comment)  MEWS Score  MEWS Temp 0  MEWS Systolic 0  MEWS Pulse 0  MEWS RR 1  MEWS LOC 0  MEWS Score 1

## 2022-03-05 NOTE — Progress Notes (Addendum)
     Daily Progress Note Intern Pager: [phone]  Patient name: KENNIETH PLOTTS Medical record number: [id number] Date of birth: 12/18/1985 Age: 36 y.o. Gender: male  Primary Care Provider: [name], DO Consultants: Neurology, cardiology Code Status: Full code  Pt Overview and Major Events to Date:  10/22: Admitted  Assessment and Plan: BERNADETTE ARMIJO is a 36 year old male presenting with shortness of breath and chest pain in setting of ESRD with missed HD, concern for pneumonia.  Differential diagnosis and management as below.  * Dyspnea Resolved. Chest x-ray and symptoms most consistent with volume overload due to missed HD. No cough, fevers, or leukocytosis suggestive of pneumonia. Will monitor off antibiotics with close outpatient follow up.   Chest pain Evaluated by cardiology. Echocardiogram with EF 45% and global hypokinesis. Suspect related to missed HD--patient reports he has pain each day he does not have HD at home as well. Likely also component of anxiety. Had chest pain overnight and troponin downtrending.  - Continue to monitor, repeat EKG - Will need outpatient cardiology follow up   ESRD (end stage renal disease) on dialysis ([place]) Stable.  Resume outpatient TTS HD. -Home PhosLo -Avoid nephrotoxic medications  Hypertension  Pressures remain elevated.  Heart rate could tolerate increasing Coreg to 25 mg twice daily.  Continue home amlodipine, continue hydralazine.   FEN/GI: Renal diet PPx: Subcu heparin Dispo:Home today.   Subjective:  Patient has no acute concerns today.  He is ready to go home.  We discussed that he did not receive CPR, and we apologize for any miscommunication.  Objective: Temp:  [97.8 F (36.6 C)-98.8 F (37.1 C)] 98.8 F (37.1 C) (10/25 0810) Pulse Rate:  [79-106] 100 (10/25 0810) Resp:  [14-20] 14 (10/25 0810) BP: (156-173)/(107-118) 170/113 (10/25 0810) SpO2:  [97 %-99 %] 97 % (10/25 0810) Weight:  [69.4 kg-72.1 kg]  69.4 kg (10/24 1916) Physical Exam: General: Not in acute distress, resting comfortably in bed Cardiovascular: RRR Respiratory: CTAB, normal work of breathing on room air Abdomen: Soft, nondistended.  Bowel sounds present. Extremities: No pitting edema in bilateral lower extremities.  Laboratory: Most recent CBC Lab Results  Component Value Date   WBC 3.9 (L) 03/04/2022   HGB 13.0 03/04/2022   HCT 39.7 03/04/2022   MCV 92.3 03/04/2022   PLT 191 03/04/2022   Most recent BMP    Latest Ref Rng & Units 03/04/2022    1:10 AM  BMP  Glucose 70 - 99 mg/dL 132   BUN 6 - 20 mg/dL 52   Creatinine 0.61 - 1.24 mg/dL 13.00   Sodium 135 - 145 mmol/L 137   Potassium 3.5 - 5.1 mmol/L 4.1   Chloride 98 - 111 mmol/L 98   CO2 22 - 32 mmol/L 24   Calcium 8.9 - 10.3 mg/dL 8.9    [name], DO 03/05/2022, 11:18 AM  PGY-1, [place] Intern pager: [phone], text pages welcome Secure chat group [place]

## 2022-03-05 NOTE — Care Management Important Message (Signed)
Important Message  Patient Details  Name: [name] MRN: [id number] Date of Birth: [birth year]   Medicare Important Message Given:  Yes     Mikhaela Zaugg 03/05/2022, 3:05 PM

## 2022-03-05 NOTE — Progress Notes (Signed)
D/C order noted. Also reviewed RN note. Contacted [facility] earlier this morning to advise clinic of pt's d/c today and that pt will resume care tomorrow.   Melven Sartorius Renal Navigator [phone]

## 2022-03-05 NOTE — Progress Notes (Signed)
[facility] Progress Note  Name: [name] MRN: [id number] DOB: 1986/05/04  Chief Complaint:  Chest pain   Subjective:  Now is inpatient.  Last HD on 10/24 with 1.6 kg UF.  He has felt poorly today and has had chest pain.  Thinks has just recently been started on hydralazine.     Review of systems:    Chronically reduced hearing - reads lips  Has had Shortness of breath  He has had chest pain  -------------- Background 36 year old ESRD patient presents with chest pain.  Imaging concerning for pneumonia.  Also undergoing work-up for possible pulmonary embolism.  Patient missed dialysis on Saturday and had shortened treatments on Tuesday and Thursday.  Likely has a component of volume overload but not extensive.  Blood pressures fairly elevated but this is consistent with outpatient blood pressures.    Intake/Output Summary (Last 24 hours) at 03/05/2022 1223 Last data filed at 03/04/2022 1858 Gross per 24 hour  Intake --  Output 1600 ml  Net -1600 ml    Vitals:  Vitals:   03/04/22 2336 03/05/22 0430 03/05/22 0810 03/05/22 1130  BP: (!) 165/112 (!) 173/114 (!) 170/113 (!) 161/109  Pulse: 79 80 100 86  Resp: 20 20 14  (!) 25  Temp: 98 F (36.7 C) 98.6 F (37 C) 98.8 F (37.1 C)   TempSrc: Oral Oral Oral   SpO2: 99% 99% 97% 95%  Weight:         Physical Exam:     General adult male in bed in no acute distress HEENT normocephalic atraumatic extraocular movements intact sclera anicteric Neck supple trachea midline Lungs clear but reduced to auscultation unlabored on room air  Heart S1S2 no rub Abdomen soft nontender nondistended Extremities no edema  Psych normal mood and affect Neuro - alert and oriented and conversant; hard of hearing - reads lips Access RUE AVF bruit and thrill   Medications reviewed   Labs:     Latest Ref Rng & Units 03/04/2022    1:10 AM 03/03/2022    4:02 AM 03/02/2022    4:31 AM  BMP  Glucose 70 - 99 mg/dL 132  72   74   BUN 6 - 20 mg/dL 52  87  75   Creatinine 0.61 - 1.24 mg/dL 13.00  19.32  17.52   Sodium 135 - 145 mmol/L 137  138  138   Potassium 3.5 - 5.1 mmol/L 4.1  5.4  5.4   Chloride 98 - 111 mmol/L 98  103  100   CO2 22 - 32 mmol/L 24  17  19    Calcium 8.9 - 10.3 mg/dL 8.9  8.9  9.3      Assessment/Plan:   Chest pain  - per primary team  - imaging concerning for PNA  Healthcare associated Pneumonia  - abx per primary team  - note that antibiotics do not appear to be currently ordered - did get cefdinir and azithromycin on 10/24  ESRD  - HD per TTS schedule   HTN  - optimize volume with HD; some overload as shortening HD treatments  - note allergy to lisinopril - cannot use RAAS blockade.  Coreg was just increased - would start imdur  - hydralazine appears to be a new medication and I wonder if he is not tolerating this well   Anemia CKD  - no ESA indicated   Dispo - Disposition per primary team.   Claudia Desanctis, MD 03/05/2022 12:42 PM

## 2022-03-05 NOTE — Progress Notes (Signed)
FMTS Interim Progress Note  S: Called to bedside for patient having chest pain.  Patient was asleep and resting comfortably.  I woke patient to speak with him. He feels that he is unable to breathe as normal.  His chest pain is located centrally and is intermittent.   O: BP (!) 161/109 (BP Location: Left Arm)   Pulse 86   Temp 98.8 F (37.1 C) (Oral)   Resp (!) 25   Wt 69.4 kg   SpO2 95%   BMI 23.26 kg/m   General: Sleeping, not in acute distress Cardio: RRR, no MRG Respiratory: CTAB, normal work of breathing on room air  A/P: Chest pain Patient has had extensive work-up for chest pain.   Benign physical exam. Troponins have plateaued twice, EKGs have remained normal.  Repeat EKG today was without ischemic change-do not believe there will be utility in troponins.  Intermittent pain that appears to go away without intervention.  Patient did not endorse anxiety. Anticipate patient is still medically stable for discharge. - Discharge  [name], DO 03/05/2022, 12:27 PM PGY-1, [facility] Service pager [phone]'

## 2022-03-06 ENCOUNTER — Telehealth: Payer: Self-pay | Admitting: Nephrology

## 2022-03-06 NOTE — Telephone Encounter (Unsigned)
Transition of care contact from inpatient facility  Date of Discharge: 03/05/22 Date of Contact:  03/06/22 Method of contact: Phone  Attempted to contact patient to discuss transition of care from inpatient admission. Patient did not answer the phone. Message was left on the patient's voicemail and if no return  call success will see at OP kidney center

## 2022-03-07 ENCOUNTER — Emergency Department (HOSPITAL_COMMUNITY)
Admission: EM | Admit: 2022-03-07 | Discharge: 2022-03-07 | Discharge status: Against Medical Advice | Payer: Medicare Other | Attending: Emergency Medicine | Admitting: Emergency Medicine

## 2022-03-07 ENCOUNTER — Encounter (HOSPITAL_COMMUNITY): Payer: Self-pay | Admitting: Emergency Medicine

## 2022-03-07 ENCOUNTER — Other Ambulatory Visit: Payer: Self-pay

## 2022-03-07 DIAGNOSIS — R519 Headache, unspecified: Secondary | ICD-10-CM | POA: Diagnosis present

## 2022-03-07 DIAGNOSIS — Z5321 Procedure and treatment not carried out due to patient leaving prior to being seen by health care provider: Secondary | ICD-10-CM | POA: Diagnosis not present

## 2022-03-07 LAB — CULTURE, BLOOD (ROUTINE X 2)
Culture: NO GROWTH
Special Requests: ADEQUATE

## 2022-03-07 MED ORDER — METOCLOPRAMIDE HCL 5 MG/ML IJ SOLN
10.0000 mg | Freq: Once | INTRAMUSCULAR | Status: AC
  Administered 2022-03-07: 10 mg via INTRAMUSCULAR
  Filled 2022-03-07: qty 2

## 2022-03-07 NOTE — ED Notes (Signed)
Called [facility] for reassessment and vitals.  No answer.

## 2022-03-07 NOTE — ED Provider Triage Note (Signed)
Emergency Medicine Provider Triage Evaluation Note  [name] , a 36 y.o. male  was evaluated in triage.  Pt complains of headaches. Symptoms have been persistent since hospital discharge 1 day ago. Had similar headaches while inpatient with negative head CT on 03/04/22. He has been using Tylenol for pain w/o relief. No fevers. Hx ESRD.  Review of Systems  Positive: As above Negative: As above  Physical Exam  BP (!) 168/107 (BP Location: Left Arm)   Pulse 86   Temp (!) 97.5 F (36.4 C) (Oral)   Resp 20   SpO2 98%  Gen:   Awake, no distress   Resp:  Normal effort  MSK:   Moves extremities without difficulty  Other:  No focal neurologic deficits  Medical Decision Making  Medically screening exam initiated at 2:44 AM.  Appropriate orders placed.  [name] was informed that the remainder of the evaluation will be completed by another provider, this initial triage assessment does not replace that evaluation, and the importance of remaining in the ED until their evaluation is complete.  Acute exacerbation of recurrent headaches - IM Reglan ordered for symptoms pending room placement.   [name], PA-C 03/07/22 0246

## 2022-03-07 NOTE — ED Triage Notes (Addendum)
Patient with headache with nausea, no vomiting.  Patient states that he had it before being discharged from the hospital.  No fevers.  Patient is hypertensive.  Patient is not answering questions in triage and being uncooperative.

## 2022-03-07 NOTE — ED Notes (Signed)
Called X1 for reassessment. No answer

## 2022-03-08 ENCOUNTER — Other Ambulatory Visit: Payer: Self-pay

## 2022-03-08 ENCOUNTER — Emergency Department (HOSPITAL_BASED_OUTPATIENT_CLINIC_OR_DEPARTMENT_OTHER): Payer: Medicare Other

## 2022-03-08 ENCOUNTER — Encounter (HOSPITAL_BASED_OUTPATIENT_CLINIC_OR_DEPARTMENT_OTHER): Payer: Self-pay

## 2022-03-08 ENCOUNTER — Emergency Department (HOSPITAL_BASED_OUTPATIENT_CLINIC_OR_DEPARTMENT_OTHER)
Admission: EM | Admit: 2022-03-08 | Discharge: 2022-03-08 | Disposition: A | Discharge status: Home / Self Care | Payer: Medicare Other | Attending: Emergency Medicine | Admitting: Emergency Medicine

## 2022-03-08 DIAGNOSIS — E875 Hyperkalemia: Secondary | ICD-10-CM | POA: Insufficient documentation

## 2022-03-08 DIAGNOSIS — Z992 Dependence on renal dialysis: Secondary | ICD-10-CM | POA: Diagnosis not present

## 2022-03-08 DIAGNOSIS — R519 Headache, unspecified: Secondary | ICD-10-CM | POA: Diagnosis present

## 2022-03-08 DIAGNOSIS — Z79899 Other long term (current) drug therapy: Secondary | ICD-10-CM | POA: Insufficient documentation

## 2022-03-08 DIAGNOSIS — N185 Chronic kidney disease, stage 5: Secondary | ICD-10-CM | POA: Insufficient documentation

## 2022-03-08 DIAGNOSIS — I120 Hypertensive chronic kidney disease with stage 5 chronic kidney disease or end stage renal disease: Secondary | ICD-10-CM | POA: Diagnosis not present

## 2022-03-08 LAB — BASIC METABOLIC PANEL
Anion gap: 20 — ABNORMAL HIGH (ref 5–15)
BUN: 99 mg/dL — ABNORMAL HIGH (ref 6–20)
CO2: 20 mmol/L — ABNORMAL LOW (ref 22–32)
Calcium: 9.2 mg/dL (ref 8.9–10.3)
Chloride: 101 mmol/L (ref 98–111)
Creatinine, Ser: 20.62 mg/dL — ABNORMAL HIGH (ref 0.61–1.24)
GFR, Estimated: 3 mL/min — ABNORMAL LOW (ref >60)
Glucose, Bld: 79 mg/dL (ref 70–99)
Potassium: 5.6 mmol/L — ABNORMAL HIGH (ref 3.5–5.1)
Sodium: 141 mmol/L (ref 135–145)

## 2022-03-08 LAB — CBC
HCT: 38 % — ABNORMAL LOW (ref 39.0–52.0)
Hemoglobin: 12.2 g/dL — ABNORMAL LOW (ref 13.0–17.0)
MCH: 29.4 pg (ref 26.0–34.0)
MCHC: 32.1 g/dL (ref 30.0–36.0)
MCV: 91.6 fL (ref 80.0–100.0)
Platelets: 214 10*3/uL (ref 150–400)
RBC: 4.15 MIL/uL — ABNORMAL LOW (ref 4.22–5.81)
RDW: 13.7 % (ref 11.5–15.5)
WBC: 5 10*3/uL (ref 4.0–10.5)
nRBC: 0 % (ref 0.0–0.2)

## 2022-03-08 LAB — CULTURE, BLOOD (ROUTINE X 2)
Culture: NO GROWTH
Special Requests: ADEQUATE

## 2022-03-08 MED ORDER — HYDROCODONE-ACETAMINOPHEN 5-325 MG PO TABS
1.0000 | ORAL_TABLET | Freq: Once | ORAL | Status: AC
  Administered 2022-03-08: 1 via ORAL
  Filled 2022-03-08: qty 1

## 2022-03-08 MED ORDER — SODIUM ZIRCONIUM CYCLOSILICATE 5 G PO PACK
5.0000 g | PACK | Freq: Once | ORAL | Status: AC
  Administered 2022-03-08: 5 g via ORAL
  Filled 2022-03-08: qty 1

## 2022-03-08 NOTE — Discharge Instructions (Signed)
Your potassium level was slightly elevated today.  It is important for you to go to dialysis today as we discussed.  Continue your home medications.  Follow-up with your primary care doctor next week to be rechecked.

## 2022-03-08 NOTE — ED Provider Notes (Signed)
[facility] EMERGENCY DEPT Provider Note   CSN: [id number] Arrival date & time: 03/08/22  0149     History  Chief Complaint  Patient presents with   Headache    [name] is a 36 y.o. male.   Headache    Patient has history of bipolar disorder hypertension end-stage renal disease, Al[place] syndrome, GERD, noncompliance with dialysis.  Patient presents to the ED for evaluation of headache.  Patient states he was recently admitted to the hospital on October 22.  He was discharged on the 25th.  Patient was treated for dyspnea after missing hemodialysis.  Patient was COVID-19 negative.  He did have opacities on chest x-ray suggesting edema versus pneumonia.  Patient was treated with IV antibiotics and discharged home on oral antibiotics.  Patient had several hemodialysis sessions while he was in the hospital.  Patient thinks that he left the hospital too early.  He continues to have problems with having a headache.  Patient states he is having bad headache in the back of his head and on the right side of his face.  His eyes hurt and he feels dizzy.  He has not had any vomiting or diarrhea.  No fevers or chills.  No neck stiffness.  At times the headache is very severe.  He has tried taking Tylenol without relief.  Patient did not go to dialysis Thursday.  He is supposed to go to dialysis today.  Patient states he called his dialysis center and does not think he needs to go because he already went 2 days this week.  He is not having any chest pain.  No shortness of breath.  Home Medications Prior to Admission medications   Medication Sig Start Date End Date Taking? Authorizing Provider  acetaminophen (TYLENOL) 500 MG tablet Take 500 mg by mouth every 6 (six) hours as needed for mild pain or moderate pain.    [provider]  amLODipine (NORVASC) 10 MG tablet Take 1 tablet (10 mg total) by mouth daily. 02/11/21   Zenia Resides, MD  calcitRIOL (ROCALTROL) 0.25 MCG  capsule Take 0.25 mcg by mouth daily.    [provider]  calcium acetate (PHOSLO) 667 MG capsule Take 1 capsule (667 mg total) by mouth 3 (three) times daily with meals. 01/04/22   August Albino, MD  carvedilol (COREG) 25 MG tablet Take 1 tablet (25 mg total) by mouth 2 (two) times daily. 03/05/22 04/04/22  Gerrit Heck, MD  cyclobenzaprine (FLEXERIL) 10 MG tablet Take 1 tablet (10 mg total) by mouth 2 (two) times daily as needed for muscle spasms. 01/05/22   Valarie Merino, MD  hydrALAZINE (APRESOLINE) 25 MG tablet Take 1 tablet (25 mg total) by mouth every 8 (eight) hours. 03/04/22   Jacelyn Grip, MD  isosorbide mononitrate (IMDUR) 30 MG 24 hr tablet Take 1 tablet (30 mg total) by mouth daily. 03/05/22   Gerrit Heck, MD  pantoprazole (PROTONIX) 40 MG tablet Take 40 mg by mouth daily.    [provider]      Allergies    Zestril [lisinopril], Nsaids, and Risperdal [risperidone]    Review of Systems   Review of Systems  Neurological:  Positive for headaches.    Physical Exam Updated Vital Signs BP 132/81   Pulse 91   Temp 97.8 F (36.6 C) (Oral)   Resp 14   Ht 1.727 m (5\' 8" )   Wt 70.3 kg   SpO2 97%   BMI 23.57 kg/m  Physical  Exam Vitals and nursing note reviewed.  Constitutional:      Appearance: He is well-developed. He is not diaphoretic.  HENT:     Head: Normocephalic and atraumatic.     Right Ear: External ear normal.     Left Ear: External ear normal.  Eyes:     General: No scleral icterus.       Right eye: No discharge.        Left eye: No discharge.     Conjunctiva/sclera: Conjunctivae normal.  Neck:     Trachea: No tracheal deviation.  Cardiovascular:     Rate and Rhythm: Normal rate and regular rhythm.  Pulmonary:     Effort: Pulmonary effort is normal. No respiratory distress.     Breath sounds: Normal breath sounds. No stridor. No wheezing or rales.  Abdominal:     General: Bowel sounds are normal. There is no distension.      Palpations: Abdomen is soft.     Tenderness: There is no abdominal tenderness. There is no guarding or rebound.  Musculoskeletal:        General: No tenderness or deformity.     Cervical back: Neck supple. No rigidity.  Skin:    General: Skin is warm and dry.     Findings: No rash.  Neurological:     General: No focal deficit present.     Mental Status: [name].     Cranial Nerves: No cranial nerve deficit (no facial droop, extraocular movements intact, no slurred speech) or dysarthria.     Sensory: No sensory deficit.     Motor: No weakness, abnormal muscle tone or seizure activity.     Coordination: Coordination normal.  Psychiatric:        Mood and Affect: Mood normal.     ED Results / Procedures / Treatments   Labs (all labs ordered are listed, but only abnormal results are displayed) Labs Reviewed  CBC - Abnormal; Notable for the following components:      Result Value   RBC 4.15 (*)    Hemoglobin 12.2 (*)    HCT 38.0 (*)    All other components within normal limits  BASIC METABOLIC PANEL - Abnormal; Notable for the following components:   Potassium 5.6 (*)    CO2 20 (*)    BUN 99 (*)    Creatinine, Ser 20.62 (*)    GFR, Estimated 3 (*)    Anion gap 20 (*)    All other components within normal limits    EKG None  Radiology CT Head Wo Contrast  Result Date: 03/08/2022 CLINICAL DATA:  New onset severe headache EXAM: CT HEAD WITHOUT CONTRAST TECHNIQUE: Contiguous axial images were obtained from the base of the skull through the vertex without intravenous contrast. RADIATION DOSE REDUCTION: This exam was performed according to the departmental dose-optimization program which includes automated exposure control, adjustment of the mA and/or kV according to patient size and/or use of iterative reconstruction technique. COMPARISON:  Recent prior CT scan of the head 03/04/2012 FINDINGS: Brain: No evidence of acute infarction, hemorrhage, hydrocephalus, extra-axial  collection or mass lesion/mass effect. Vascular: No hyperdense vessel or unexpected calcification. Skull: Normal. Negative for fracture or focal lesion. Sinuses/Orbits: No acute finding. Other: None. IMPRESSION: Negative head CT. Electronically Signed   By: Jacqulynn Cadet M.D.   On: 03/08/2022 07:38    Procedures .1-3 Lead EKG Interpretation  Performed by: Dorie Rank, MD Authorized by: Dorie Rank, MD     Interpretation: normal  ECG rate:  80   ECG rate assessment: normal     Rhythm: sinus rhythm     Ectopy: none     Conduction: normal       Medications Ordered in ED Medications  HYDROcodone-acetaminophen (NORCO/VICODIN) 5-325 MG per tablet 1 tablet (1 tablet Oral Given 03/08/22 0718)  sodium zirconium cyclosilicate (LOKELMA) packet 5 g (5 g Oral Given 03/08/22 4163)    ED Course/ Medical Decision Making/ A&P Clinical Course as of 03/08/22 0832  Sat Mar 08, 2022  0742 CT Head Wo Contrast Head CT without acute abnormality [JK]  0813 CBC(!) Hemoglobin decreased.  Not significantly changed compared to previous values [JK]  8453 Basic metabolic panel(!) Potassium slightly elevated.  BUN and creatinine elevated consistent with his chronic kidney disease [JK]    Clinical Course User Index [JK] Dorie Rank, MD                           Medical Decision Making Problems Addressed: Chronic renal failure, stage 5 [facility]): chronic illness or injury with exacerbation, progression, or side effects of treatment Hyperkalemia: acute illness or injury Nonintractable headache, unspecified chronicity pattern, unspecified headache type: acute illness or injury  Amount and/or Complexity of Data Reviewed Labs: ordered. Decision-making details documented in ED Course. Radiology: ordered and independent interpretation performed. Decision-making details documented in ED Course.  Risk Prescription drug management.   Patient presented to the emergency room for evaluation of a headache.   Patient has history of chronic renal failure.  He has a history of noncompliance with his dialysis.  Patient was recently in the hospital for pneumonia.  Fortunately no signs of any respiratory difficulty here in the ED.  His oxygen saturation is normal.  He is not breathing fast.  His lungs are clear.  Findings do not suggest pulmonary edema or fluid overload.  Clinically no signs of worsening symptoms associated with his previous pneumonia diagnosis.  Patient did complain of a headache.  He is not having any meningismus.  No signs of ear infection or pharyngitis.  CT does not show any acute abnormality.  Possible migraine type headache.  Patient was treated with a hydrocodone tablet with symptomatic relief.  Patient noted to have elevated potassium levels.  No signs of any changes noted on the cardiac monitor.  Patient did not go to dialysis on Thursday.  He supposed to go to this morning.  Patient initially was not planning on it but I stressed the importance of him going to his dialysis because of his elevated potassium.  Patient was given a dose of Lokelma.  Patient is at the freestanding ED this morning.  I think he is stable to go to his outpatient dialysis versus requiring transfer and admission to the hospital for dialysis.        Final Clinical Impression(s) / ED Diagnoses Final diagnoses:  Nonintractable headache, unspecified chronicity pattern, unspecified headache type  Chronic renal failure, stage 5 (HCC)  Hyperkalemia    Rx / DC Orders ED Discharge Orders     None         Dorie Rank, MD 03/08/22 [phone]

## 2022-03-08 NOTE — ED Notes (Signed)
Patient transported to CT 

## 2022-03-08 NOTE — ED Triage Notes (Signed)
Headache that starts in the neck and radiates throughout his head. Makes him dizzy and his eyes hurts.

## 2022-03-09 ENCOUNTER — Emergency Department (HOSPITAL_COMMUNITY)
Admission: EM | Admit: 2022-03-09 | Discharge: 2022-03-10 | Disposition: A | Discharge status: Home / Self Care | Payer: Medicare Other | Attending: Emergency Medicine | Admitting: Emergency Medicine

## 2022-03-09 ENCOUNTER — Emergency Department (HOSPITAL_COMMUNITY): Payer: Medicare Other

## 2022-03-09 ENCOUNTER — Other Ambulatory Visit: Payer: Self-pay

## 2022-03-09 ENCOUNTER — Encounter (HOSPITAL_COMMUNITY): Payer: Self-pay | Admitting: Emergency Medicine

## 2022-03-09 DIAGNOSIS — G8929 Other chronic pain: Secondary | ICD-10-CM | POA: Diagnosis not present

## 2022-03-09 DIAGNOSIS — R079 Chest pain, unspecified: Secondary | ICD-10-CM | POA: Insufficient documentation

## 2022-03-09 DIAGNOSIS — R519 Headache, unspecified: Secondary | ICD-10-CM | POA: Insufficient documentation

## 2022-03-09 DIAGNOSIS — Z87891 Personal history of nicotine dependence: Secondary | ICD-10-CM | POA: Diagnosis not present

## 2022-03-09 DIAGNOSIS — I120 Hypertensive chronic kidney disease with stage 5 chronic kidney disease or end stage renal disease: Secondary | ICD-10-CM | POA: Insufficient documentation

## 2022-03-09 DIAGNOSIS — R058 Other specified cough: Secondary | ICD-10-CM | POA: Diagnosis not present

## 2022-03-09 DIAGNOSIS — Z1152 Encounter for screening for COVID-19: Secondary | ICD-10-CM | POA: Diagnosis not present

## 2022-03-09 DIAGNOSIS — N186 End stage renal disease: Secondary | ICD-10-CM | POA: Insufficient documentation

## 2022-03-09 DIAGNOSIS — R0602 Shortness of breath: Secondary | ICD-10-CM | POA: Diagnosis not present

## 2022-03-09 DIAGNOSIS — Z992 Dependence on renal dialysis: Secondary | ICD-10-CM | POA: Diagnosis not present

## 2022-03-09 LAB — CBC WITH DIFFERENTIAL/PLATELET
Abs Immature Granulocytes: 0.01 10*3/uL (ref 0.00–0.07)
Basophils Absolute: 0.1 10*3/uL (ref 0.0–0.1)
Basophils Relative: 1 %
Eosinophils Absolute: 0.3 10*3/uL (ref 0.0–0.5)
Eosinophils Relative: 6 %
HCT: 37.8 % — ABNORMAL LOW (ref 39.0–52.0)
Hemoglobin: 12.4 g/dL — ABNORMAL LOW (ref 13.0–17.0)
Immature Granulocytes: 0 %
Lymphocytes Relative: 38 %
Lymphs Abs: 1.7 10*3/uL (ref 0.7–4.0)
MCH: 29.9 pg (ref 26.0–34.0)
MCHC: 32.8 g/dL (ref 30.0–36.0)
MCV: 91.1 fL (ref 80.0–100.0)
Monocytes Absolute: 0.4 10*3/uL (ref 0.1–1.0)
Monocytes Relative: 8 %
Neutro Abs: 2.1 10*3/uL (ref 1.7–7.7)
Neutrophils Relative %: 47 %
Platelets: 234 10*3/uL (ref 150–400)
RBC: 4.15 MIL/uL — ABNORMAL LOW (ref 4.22–5.81)
RDW: 13.4 % (ref 11.5–15.5)
WBC: 4.6 10*3/uL (ref 4.0–10.5)
nRBC: 0 % (ref 0.0–0.2)

## 2022-03-09 LAB — RESP PANEL BY RT-PCR (FLU A&B, COVID) ARPGX2
Influenza A by PCR: NEGATIVE
Influenza B by PCR: NEGATIVE
SARS Coronavirus 2 by RT PCR: NEGATIVE

## 2022-03-09 LAB — COMPREHENSIVE METABOLIC PANEL
ALT: 40 U/L (ref 0–44)
AST: 36 U/L (ref 15–41)
Albumin: 3.7 g/dL (ref 3.5–5.0)
Alkaline Phosphatase: 77 U/L (ref 38–126)
Anion gap: 19 — ABNORMAL HIGH (ref 5–15)
BUN: 63 mg/dL — ABNORMAL HIGH (ref 6–20)
CO2: 27 mmol/L (ref 22–32)
Calcium: 9.6 mg/dL (ref 8.9–10.3)
Chloride: 97 mmol/L — ABNORMAL LOW (ref 98–111)
Creatinine, Ser: 16.13 mg/dL — ABNORMAL HIGH (ref 0.61–1.24)
GFR, Estimated: 4 mL/min — ABNORMAL LOW (ref >60)
Glucose, Bld: 92 mg/dL (ref 70–99)
Potassium: 4.9 mmol/L (ref 3.5–5.1)
Sodium: 143 mmol/L (ref 135–145)
Total Bilirubin: 0.5 mg/dL (ref 0.3–1.2)
Total Protein: 6.8 g/dL (ref 6.5–8.1)

## 2022-03-09 LAB — TROPONIN I (HIGH SENSITIVITY): Troponin I (High Sensitivity): 71 ng/L — ABNORMAL HIGH (ref <18)

## 2022-03-09 NOTE — ED Provider Triage Note (Signed)
Emergency Medicine Provider Triage Evaluation Note  [name] , a 36 y.o. male  was evaluated in triage.  Pt complains of continued headache, chest tightness, & dyspnea since recent hospital admission. Went to dialysis yesterday however sxs continue to occur intermittently. Mild cough. Denies fever, vomiting, change in vision, or unilateral numbness/weakness. Dialyzes T/Th/Sat  Review of Systems  Per above.   Physical Exam  BP (!) 151/99   Pulse 87   Temp 98 F (36.7 C) (Oral)   Resp 16   SpO2 98%  Gen:   Awake, no distress   Resp:  Normal effort  MSK:   Moves extremities without difficulty  Other:  PERRL. No focal deficits.   Medical Decision Making  Medically screening exam initiated at 5:37 AM.  Appropriate orders placed.  [name] was informed that the remainder of the evaluation will be completed by another provider, this initial triage assessment does not replace that evaluation, and the importance of remaining in the ED until their evaluation is complete.  Headache Dyspnea.    [name], [place] 03/09/22 [phone]

## 2022-03-09 NOTE — ED Triage Notes (Signed)
Pt reported to ED stating that he is experiencing ongoing headache and shortness of breath. Was previously seen for similar symptoms.

## 2022-03-10 DIAGNOSIS — R079 Chest pain, unspecified: Secondary | ICD-10-CM | POA: Diagnosis not present

## 2022-03-10 LAB — TROPONIN I (HIGH SENSITIVITY): Troponin I (High Sensitivity): 73 ng/L — ABNORMAL HIGH (ref <18)

## 2022-03-10 NOTE — ED Notes (Signed)
Patient asked forme to call back to ask "what's going on back there" in relation to the wait times. I informed him that I was not going to call anyone and that he needed to wait for his name to be called. He then started yelling and telling Probation officer to call back. Security was asked to come over and they escorted him back to his seat.

## 2022-03-10 NOTE — Discharge Instructions (Signed)
You were evaluated in the Emergency Department and after careful evaluation, we did not find any emergent condition requiring admission or further testing in the hospital.  Your exam/testing today is overall reassuring.  Recommend follow-up with your primary care doctor to discuss your chronic symptoms.  Important that you follow your normal dialysis schedule and do not miss any appointments.  Please return to the Emergency Department if you experience any worsening of your condition.   Thank you for allowing [place] to be a part of your care.

## 2022-03-10 NOTE — ED Provider Notes (Signed)
[facility] Emergency Department Provider Note MRN:  [id number]  Arrival date & time: 03/10/22     Chief Complaint   Chest pain History of Present Illness   [name] is a 36 y.o. year-old male with a history of bipolar disorder, ESRD, hypertension presenting to the ED with chief complaint of chest pain.  Continued chest pain, described as chronic.  Also with continued headaches that are also chronic.  No abdominal pain, no recent fever or cough.  Review of Systems  A thorough review of systems was obtained and all systems are negative except as noted in the HPI and PMH.   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   Hypertension    Low blood sugar    Renal disorder     Past Surgical History:  Procedure Laterality Date   APPENDECTOMY     AV FISTULA PLACEMENT Left 03/03/2019   Procedure: ARTERIOVENOUS (AV) FISTULA CREATION LEFT ARM;  Surgeon: Waynetta Sandy, MD;  [facility];  Service: Vascular;  Laterality: Left;   [place] Right 04/12/2019   Procedure: RIGHT UPPER EXTREMITY [place];  Surgeon: Angelia Mould, MD;  Location: [facility];  Service: Vascular;  Laterality: Right;   BIOPSY  10/14/2021   Procedure: BIOPSY;  Surgeon: Doran Stabler, MD;  Location: [facility];  Service: Gastroenterology;;   COLONOSCOPY WITH PROPOFOL N/A 10/14/2021   Procedure: COLONOSCOPY WITH PROPOFOL;  Surgeon: Doran Stabler, MD;  Location: [facility];  Service: Gastroenterology;  Laterality: N/A;   LIGATION OF ARTERIOVENOUS  FISTULA Left 03/06/2019   Procedure: LIGATION OF ARTERIOVENOUS  FISTULA;  Surgeon: Marty Heck, MD;   Location: [facility];  Service: Vascular;  Laterality: Left;   spinal tap     SPINE SURGERY     related to a spinal infection, unsure of surgery or infection source   WISDOM TOOTH EXTRACTION      Family History  Problem Relation Age of Onset   Hypertension Mother    Kidney failure Mother    Diabetes Mother    Hearing loss Father    Hypertension Sister    Heart disease Maternal Grandmother    Stroke Maternal Grandfather    Asthma Son     Social History   Socioeconomic History   Marital status: Married    Spouse name: Not on file   Number of children: 3   Years of education: Not on file   Highest education level: Not on file  Occupational History   Occupation: HVAC  Tobacco Use   Smoking status: Former    Packs/day: 0.30    Types: Cigarettes    Quit date: 02/27/2016    Years since quitting: 6.0   Smokeless tobacco: Never  Vaping Use   Vaping Use: Never used  Substance and Sexual Activity   Alcohol use: Not Currently    Comment: rare   Drug use: Yes    Types: Marijuana   Sexual activity: Not Currently  Other Topics Concern   Not on file  Social History Narrative   Not on file   Social Determinants of  Health   Financial Resource Strain: Not on file  Food Insecurity: No Food Insecurity (03/04/2022)   Hunger Vital Sign    Worried About Running Out of Food in the Last Year: Never true    Ran Out of Food in the Last Year: Never true  Transportation Needs: No Transportation Needs (03/04/2022)   PRAPARE - Hydrologist (Medical): No    Lack of Transportation (Non-Medical): No  Physical Activity: Not on file  Stress: Not on file  Social Connections: Not on file  Intimate Partner Violence: Not At Risk (03/04/2022)   Humiliation, Afraid, Rape, and Kick questionnaire    Fear of Current or Ex-Partner: No    Emotionally Abused: No    Physically Abused: No    Sexually Abused: No     Physical Exam   Vitals:   03/09/22 2344 03/10/22 0237   BP: (!) 171/108 (!) 157/108  Pulse: 92 86  Resp: 16 16  Temp: 98.2 F (36.8 C) 98.5 F (36.9 C)  SpO2: 99% 100%    CONSTITUTIONAL: Well-appearing, NAD NEURO/PSYCH:  Alert and oriented x 3, no focal deficits EYES:  eyes equal and reactive ENT/NECK:  no LAD, no JVD CARDIO: Regular rate, well-perfused, normal S1 and S2 PULM:  CTAB no wheezing or rhonchi GI/GU:  non-distended, non-tender MSK/SPINE:  No gross deformities, no edema SKIN:  no rash, atraumatic   *Additional and/or pertinent findings included in MDM below  Diagnostic and Interventional Summary    EKG Interpretation  Date/Time:  Monday March 10 2022 19:75:88 EDT Ventricular Rate:  97 PR Interval:  162 QRS Duration: 86 QT Interval:  372 QTC Calculation: 472 R Axis:   43 Text Interpretation: Normal sinus rhythm T wave abnormality, consider lateral ischemia Prolonged QT Abnormal ECG When compared with ECG of 05-Mar-2022 11:27, PREVIOUS ECG IS PRESENT No significant change was found Confirmed by Gerlene Fee [phone]) on 03/10/2022 6:48:26 AM       Labs Reviewed  COMPREHENSIVE METABOLIC PANEL - Abnormal; Notable for the following components:      Result Value   Chloride 97 (*)    BUN 63 (*)    Creatinine, Ser 16.13 (*)    GFR, Estimated 4 (*)    Anion gap 19 (*)    All other components within normal limits  CBC WITH DIFFERENTIAL/PLATELET - Abnormal; Notable for the following components:   RBC 4.15 (*)    Hemoglobin 12.4 (*)    HCT 37.8 (*)    All other components within normal limits  TROPONIN I (HIGH SENSITIVITY) - Abnormal; Notable for the following components:   Troponin I (High Sensitivity) 71 (*)    All other components within normal limits  TROPONIN I (HIGH SENSITIVITY) - Abnormal; Notable for the following components:   Troponin I (High Sensitivity) 73 (*)    All other components within normal limits  RESP PANEL BY RT-PCR (FLU A&B, COVID) [id number]    DG Chest 2 View  Final Result       Medications - No data to display   Procedures  /  Critical Care Procedures  ED Course and Medical Decision Making  Initial Impression and Ddx Chronic chest pain, headache, overall doubt emergent process.  Multiple repeat CT heads which are normal, patient has normal neurological exam, is hard of hearing at baseline.  Patient did have a recent admission for pneumonia with a type II NSTEMI with a troponin of 900 recently.  Patient's health complicated by frequently missing  HD.  Troponins during this ED visit are significantly decreased since then and flat upon repeat.  He is sleeping comfortably in no acute distress.  Chest x-ray revealing evidence of acute bronchitis, however patient has no fever, no cough, suspect findings are related to healing or recovery from the recent multifocal pneumonia.  Past medical/surgical history that increases complexity of ED encounter: ESRD  Interpretation of Diagnostics I personally reviewed the Chest Xray and my interpretation is as follows: Possible bronchitis  Labs overall reassuring with no significant blood count or electrolyte disturbance.  Patient Reassessment and Ultimate Disposition/Management     Discharge.  Patient management required discussion with the following services or consulting groups:  None  Complexity of Problems Addressed Acute illness or injury that poses threat of life of bodily function  Additional Data Reviewed and Analyzed Further history obtained from: Recent discharge summary  Additional Factors Impacting ED Encounter Risk None  Barth Kirks. Sedonia Small, [place] [email or web address]  Final Clinical Impressions(s) / ED Diagnoses     ICD-10-CM   1. Chest pain, unspecified type  R07.9     2. Chronic nonintractable headache, unspecified headache type  R51.9    G89.29       ED Discharge Orders     None        Discharge Instructions Discussed with and Provided  to Patient:     Discharge Instructions      You were evaluated in the Emergency Department and after careful evaluation, we did not find any emergent condition requiring admission or further testing in the hospital.  Your exam/testing today is overall reassuring.  Recommend follow-up with your primary care doctor to discuss your chronic symptoms.  Important that you follow your normal dialysis schedule and do not miss any appointments.  Please return to the Emergency Department if you experience any worsening of your condition.   Thank you for allowing [place] to be a part of your care.       Maudie Flakes, MD 03/10/22 [phone]

## 2022-03-17 ENCOUNTER — Ambulatory Visit (INDEPENDENT_AMBULATORY_CARE_PROVIDER_SITE_OTHER): Payer: Medicare Other | Admitting: Family Medicine

## 2022-03-17 ENCOUNTER — Encounter: Payer: Self-pay | Admitting: Family Medicine

## 2022-03-17 ENCOUNTER — Ambulatory Visit: Payer: Medicaid Other | Admitting: Family Medicine

## 2022-03-17 VITALS — BP 160/100 | HR 90 | Temp 98.9°F | Wt 179.6 lb

## 2022-03-17 DIAGNOSIS — Z992 Dependence on renal dialysis: Secondary | ICD-10-CM

## 2022-03-17 DIAGNOSIS — R519 Headache, unspecified: Secondary | ICD-10-CM | POA: Diagnosis not present

## 2022-03-17 DIAGNOSIS — I509 Heart failure, unspecified: Secondary | ICD-10-CM | POA: Diagnosis not present

## 2022-03-17 DIAGNOSIS — D649 Anemia, unspecified: Secondary | ICD-10-CM

## 2022-03-17 DIAGNOSIS — N186 End stage renal disease: Secondary | ICD-10-CM | POA: Diagnosis not present

## 2022-03-17 DIAGNOSIS — J189 Pneumonia, unspecified organism: Secondary | ICD-10-CM | POA: Diagnosis not present

## 2022-03-17 DIAGNOSIS — I159 Secondary hypertension, unspecified: Secondary | ICD-10-CM

## 2022-03-17 MED ORDER — TRIAMCINOLONE ACETONIDE 0.025 % EX OINT: 1.0000 | TOPICAL_OINTMENT | Freq: Two times a day (BID) | CUTANEOUS | 0 refills | Status: DC

## 2022-03-17 MED ORDER — HYDRALAZINE HCL 50 MG PO TABS: 50.0000 mg | ORAL_TABLET | Freq: Three times a day (TID) | ORAL | 3 refills | Status: DC

## 2022-03-17 NOTE — Progress Notes (Signed)
SUBJECTIVE:   CHIEF COMPLAINT / HPI:   [name] is a 36 y.o. male who presents to the [facility] clinic today to discuss the following concerns:   Hospital Follow Up He was recently admitted on 10/22 until 10/25 for dyspnea in the setting of volume overload and pneumonia.  He presented after several missed HD sessions.  He received IV ceftriaxone and azithromycin for his pneumonia but these were ultimately discontinued after 48 hours due to absence of symptoms.  His dyspnea was markedly improved after HD.  His hospitalization was complicated by recurrent complaints of chest pain.  His cardiac work-up remained negative and this was thought to be related to anxiety.  He was given hydroxyzine 25 mg as needed. 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   Today he reports that he has been compliant with his medications.  He brings with him his new medications of hydralazine, Imdur, Coreg.  He wanted clarification on whether or not his heart had stopped during the hospitalization.  He vividly remembers talking with a cardiologist and thought he was told that his heart had stopped.  He continues to have headaches, feels that the new medication is possibly making this worse.  He has been taking Tylenol 1500 mg daily and recently bought a migraine medication.  He has intermittent shortness of breath and is wondering if he can get a prescription for albuterol inhaler.  Reports needing breathing treatments at HD sessions occasionally.  His last HD session was on 11/2.  He missed his most recent session on 11/4- he reports he did not have a good experience with a nurse so he left. The RN supposedly accused him of smoking in the bathroom. He reports he just had a bowel movement in the bathroom which is why he took a while.   PCP Follow-up Recommendations: Repeat CXR in 4-6 weeks Follow up respiratory status Needs cardiology follow up for  HFmrEF Recommend outpatient therapy for anxiety  Follow up headaches - suspect migraines or related to severe HTN; CT head negative Monitor BP while on hydralazine, coreg, amlodipine and imdur Recommend GI referral, possible esophagram for dysphagia noted by SLP Recommend checking BMP and CBC in 1 week   PERTINENT  PMH / PSH: ESRD on HD, bipolar 1 disorder, hypertension, Alport syndrome  OBJECTIVE:   BP (!) 160/90   Pulse 90   Temp 98.9 F (37.2 C)   Wt 179 lb 9.6 oz (81.5 kg)   SpO2 99%   BMI 27.31 kg/m   Recheck: 160/100  General: NAD, pleasant, able to participate in exam, hard of hearing  Cardiac: RRR Respiratory: CTAB, normal effort, No wheezes, rales or rhonchi Extremities: no edema  Psych: Normal affect and mood  ASSESSMENT/PLAN:   1. ESRD (end stage renal disease) on dialysis [facility]) Encouraged him to not miss any further HD sessions. Last HD was 11/2. - Basic Metabolic Panel  2. Anemia, unspecified type Hgb on d/c was 12.2.  - Recheck CBC  3. 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 He continues on coreg, hydralazine, imdur.  - Ambulatory referral to Cardiology - Continue coreg, imdur - Increasing hydralazine to 50 mg q8h   4. Pneumonia due to infectious organism, unspecified laterality, unspecified part of lung Without cough or fevers but does endorse intermittent SOB. Lung exam clear today.  - DG Chest 2 View; Future: recommend in 2-3 weeks  to f/u CXR from hospital.  - PFT scheduled with Dr. Valentina Lucks for next week given he endorses intermittent SOB and was requesting Albuterol inhaler today. Reports he is given breathing treatments with HD.   5. Secondary hypertension BP elevated on 2 checks today.  He reports adherence to his medications. -Increase hydralazine to 50 mg every 8 hours -Continue Amlodipine, Coreg and Imdur  6. Nonintractable headache, unspecified chronicity pattern, unspecified headache  type Continues to have headaches. CT head imaging negative in the hospital. Could be related to elevated BP or could be related to migraines though seems more tension-related.  -Can use Tylenol as needed but avoid frequent use to avoid medication overuse headaches -Improve sleep, exercise, hydration (cautious though with him being ESRD).  -Consider starting preventative therapy if determined to be migraines   [name], [place]

## 2022-03-17 NOTE — Patient Instructions (Addendum)
It was wonderful to see you today.  Please bring ALL of your medications with you to every visit.   Today we talked about:  We are doing lab work today to check your electrolytes, kidney function, and blood count. I will send you a MyChart message if you have MyChart. Otherwise, I will give you a call for abnormal results or send a letter if everything returned back normal. If you don't hear from me in 2 weeks, please call the office.    I have increased your hydralazine to 50 mg every 8 hours.  I have scheduled an appointment with our pharmacist for lung function testing next week.   I am placing order for an x-ray, please get this done in the next 2 to 3 weeks. No appointment is necessary, just walk in to the radiology department.  Sending referral to cardiology to follow-up on your need for newly diagnosed heart failure.  Thank you for coming to your visit as scheduled. We have had a large "no-show" problem lately, and this significantly limits our ability to see and care for patients. As a friendly reminder- if you cannot make your appointment please call to cancel. We do have a no show policy for those who do not cancel within 24 hours. Our policy is that if you miss or fail to cancel an appointment within 24 hours, 3 times in a 34-month period, you may be dismissed from our clinic.   Thank you for choosing [facility].   Please call [phone] with any questions about today's appointment.  Please be sure to schedule follow up at the front  desk before you leave today.   [name], DO PGY-3 Family Medicine

## 2022-03-19 LAB — BASIC METABOLIC PANEL
BUN/Creatinine Ratio: 5 — ABNORMAL LOW (ref 9–20)
BUN: 96 mg/dL (ref 6–20)
CO2: 19 mmol/L — ABNORMAL LOW (ref 20–29)
Calcium: 8.9 mg/dL (ref 8.7–10.2)
Chloride: 100 mmol/L (ref 96–106)
Creatinine, Ser: 19.74 mg/dL (ref 0.76–1.27)
Glucose: 111 mg/dL — ABNORMAL HIGH (ref 70–99)
Potassium: 5.4 mmol/L — ABNORMAL HIGH (ref 3.5–5.2)
Sodium: 137 mmol/L (ref 134–144)
eGFR: 3 mL/min/{1.73_m2} — ABNORMAL LOW (ref >59)

## 2022-03-19 LAB — CBC
Hematocrit: 30.2 % — ABNORMAL LOW (ref 37.5–51.0)
Hemoglobin: 10.4 g/dL — ABNORMAL LOW (ref 13.0–17.7)
MCH: 30.1 pg (ref 26.6–33.0)
MCHC: 34.4 g/dL (ref 31.5–35.7)
MCV: 87 fL (ref 79–97)
Platelets: 207 10*3/uL (ref 150–450)
RBC: 3.46 x10E6/uL — ABNORMAL LOW (ref 4.14–5.80)
RDW: 14 % (ref 11.6–15.4)
WBC: 5.2 10*3/uL (ref 3.4–10.8)

## 2022-03-24 ENCOUNTER — Ambulatory Visit (INDEPENDENT_AMBULATORY_CARE_PROVIDER_SITE_OTHER): Payer: Medicare Other | Admitting: Pharmacist

## 2022-03-24 ENCOUNTER — Encounter: Payer: Self-pay | Admitting: Pharmacist

## 2022-03-24 VITALS — BP 148/74 | HR 91 | Ht 69.0 in | Wt 170.4 lb

## 2022-03-24 DIAGNOSIS — R0602 Shortness of breath: Secondary | ICD-10-CM | POA: Diagnosis not present

## 2022-03-24 NOTE — Assessment & Plan Note (Signed)
Patient has been experiencing worsening shortness of breath since his last hospital visit on 10/22. He has not been taking any medication for his breathing. Spirometry evaluation reveals normal lung function.  -No medication changes   -Reviewed results of pulmonary function tests.  Pt verbalized understanding of results and education.    Patient smokes marijuana daily and has recently started smoking 1-2 Black and Mild's (tobacco) daily. -Encouraged cessation and discussed the smoking cessation requirement for a kidney transplant. Patient aware he will need to quit if a transplant options is available to him.  He is confident he can quit smoking completely in the future.

## 2022-03-24 NOTE — Patient Instructions (Addendum)
It was good to meet you!  We performed a lung test today and your lung function was good!  We will share results with Dr. [name]

## 2022-03-24 NOTE — Progress Notes (Signed)
   S:     Chief Complaint  Patient presents with   Medication Management    PFTs   [name] is a 36 y.o. male who presents for lung function evaluation.  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  Patient was referred and last seen by Primary Care Provider, Dr. [name], on 03/17/22.   At last visit, the patient complained of intermittent shortness of breath.   Patient reports breathing has been poor. The patient reports that he can't breath at night when he lays down and he losses his breath walking to his car. The breathing has been bothering him for a while but worsened since his recent hospital admission on 10/22. Patient reports his breathing is worse when he's in pain. Reports breathing improves following dialysis.  Patient reports he has never been diagnosed with asthma or COPD.  The patient reports he was diagnosed with kidney disease around age 41 and has been on dialysis for 3 years this December.  Social/Family history: Smoking marijuana since age 69 (2-3 blunts/day). Reports increased stress at home due to separation from wife which has resulted him smoking 1-2 Black and Mild's per day over the past 1-2 months. Prior to this, he had quit smoking cigarettes for about 10 years. Patient reports he has not drank alcohol in ~15 years.  Patient endorsed epigastric pain induced by the pulmonary function test, which resolved within a few minutes.   O: Review of Systems  All other systems reviewed and are negative.   Physical Exam Constitutional:      Appearance: Normal appearance.  HENT:     Ears:     Comments: Hard of hearing - requiring loud voice for understanding.  Pulmonary:     Effort: Pulmonary effort is normal.  Psychiatric:        Mood and Affect: Mood normal.        Thought Content: Thought content normal.        Judgment: Judgment normal.     Vitals:   03/24/22 0954  BP: (!) 148/74  Pulse: 91  SpO2: 99%    See "scanned report" or  Documentation Flowsheet (discrete results - PFTs) for Spirometry results. Patient provided good effort while attempting spirometry.   Lung Age = 69  A/P: Patient has been experiencing worsening shortness of breath since his last hospital visit on 10/22. He has not been taking any medication for his breathing. Spirometry evaluation reveals normal lung function.  -No medication changes   -Reviewed results of pulmonary function tests.  Pt verbalized understanding of results and education.    Patient smokes marijuana daily and has recently started smoking 1-2 Black and Mild's (tobacco) daily. -Encouraged cessation and discussed the smoking cessation requirement for a kidney transplant. Patient aware he will need to quit if a transplant options is available to him.  He is confident he can quit smoking completely in the future.  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  No medication cause identified.  - Shared that this problem should be focus of next PCP visit.    Written patient instructions provided.   Total time in face to face counseling 37 minutes.    Follow-up:  PCP clinic visit in 1-2 months.  Patient seen with Geraldo Docker, PharmD Candidate.

## 2022-03-26 NOTE — Progress Notes (Signed)
Reviewed: I agree with Dr. Koval's documentation and management. 

## 2022-04-08 ENCOUNTER — Ambulatory Visit (INDEPENDENT_AMBULATORY_CARE_PROVIDER_SITE_OTHER): Payer: Medicare Other | Admitting: Family Medicine

## 2022-04-08 ENCOUNTER — Encounter: Payer: Self-pay | Admitting: Family Medicine

## 2022-04-08 VITALS — BP 164/92 | HR 94 | Ht 69.0 in | Wt 164.4 lb

## 2022-04-08 DIAGNOSIS — M25511 Pain in right shoulder: Secondary | ICD-10-CM | POA: Diagnosis not present

## 2022-04-08 DIAGNOSIS — M25512 Pain in left shoulder: Secondary | ICD-10-CM

## 2022-04-08 DIAGNOSIS — I5022 Chronic systolic (congestive) heart failure: Secondary | ICD-10-CM | POA: Insufficient documentation

## 2022-04-08 DIAGNOSIS — H60503 Unspecified acute noninfective otitis externa, bilateral: Secondary | ICD-10-CM | POA: Insufficient documentation

## 2022-04-08 MED ORDER — NEOMYCIN-POLYMYXIN-HC 3.5-10000-1 OT SOLN: 3.0000 [drp] | Freq: Four times a day (QID) | OTIC | 0 refills | Status: DC

## 2022-04-08 NOTE — Patient Instructions (Addendum)
It was great to see you! Thank you for allowing me to participate in your care!  I recommend that you always bring your medications to each appointment as this makes it easy to ensure we are on the correct medications and helps [place] not miss when refills are needed.  Our plans for today:  - Please use the ear drops to treat your ear infection. Use the drops 3-4 times per day, for 7 days. - I have placed a referral for sports medicine and to physical therapy. They should called to schedule you. -You may use over the counter Voltaren Gel on both shoulders.    Take care and seek immediate care sooner if you develop any concerns.   Dr. Salvadore Oxford, MD [place]

## 2022-04-08 NOTE — Assessment & Plan Note (Addendum)
Patient bilateral shoulder pain likely related to subcutaneous nodules bilaterally on exam.  Etiology of nodules is unclear, not common occurrence and patients with Alport syndrome.  Less likely calciphylaxis due to dialysis possible etiology of nodules. - Referral to sports medicine placed for ultrasound of shoulders - Referral to physical therapy for shoulder strengthening - Counseled on Voltaren gel for pain management as well as Tylenol as needed - Consider future biopsy - Follow-up 1 month

## 2022-04-08 NOTE — Progress Notes (Signed)
    SUBJECTIVE:   CHIEF COMPLAINT / HPI: ear pain  Ear pain-woke up with fluid draining out of left ear. Right ear has hurt as well. Couple weeks of ear pain. Unsure about fevers. Has not been in pools. Tried to wash out with water. States he spray some kind of "peroxide" into ears that did not help. Has been using cue tips. Has seen blood and fluid.    Shoulder pain-has pain in bilateral shoulders. Reports cracking and popping. Ongoing for more than a month. Worse with movement and lifting. No falls. Not sure if he has had previous dislocation.   PERTINENT  PMH / PSH: Alport Syndrome, HFmrEF, HTN, ESRD Dialysis  OBJECTIVE:   BP (!) 164/92   Pulse 94   Ht 5\' 9"  (1.753 m)   Wt 164 lb 6.4 oz (74.6 kg)   SpO2 100%   BMI 24.28 kg/m   General: NAD  HEENT: Left external auditory canal notable erythema, no obvious foreign bodies, intact tympanic membrane, right external auditory canal normal, intact tympanic membrane, clear cone of light, pain with retraction of pinna bilaterally Cardiovascular: RRR, no murmurs, no peripheral edema Respiratory: normal WOB on room air, CTAB MSK: No obvious erythema bilaterally of shoulders, multiple subcutaneous nodules bilaterally largest 2 x 3 cm nodule over spine of scapula on right side tender to palpation, not mobile, left-sided-sided 1 cm nodule over biceps tendon over bicipital groove, left-sided 1 cm nodule around coracoid process, no tenderness to palpation bilaterally along clavicle or AC joint, range of motion bilaterally in shoulder flexion and abduction due to pain, patient unable to specifically localize pain other than general area of shoulders limiting special tests left shoulder.  ASSESSMENT/PLAN:   Acute otitis externa of both ears Exam and history consistent with bilateral otitis externa.  Intact bilateral tympanic membranes, no concern for membrane rupture.  Will treat with 7-day course of Cortisporin drops 3-4 times per day.  Follow-up if  pain continues.  Bilateral shoulder pain Patient bilateral shoulder pain likely related to subcutaneous nodules bilaterally on exam.  Etiology of nodules is unclear, not common occurrence and patients with Alport syndrome.  Less likely calciphylaxis due to dialysis possible etiology of nodules. - Referral to sports medicine placed for ultrasound of shoulders - Referral to physical therapy for shoulder strengthening - Counseled on Voltaren gel for pain management as well as Tylenol as needed - Consider future biopsy - Follow-up 1 month     Edward Oxford, MD [place]

## 2022-04-08 NOTE — Assessment & Plan Note (Signed)
Exam and history consistent with bilateral otitis externa.  Intact bilateral tympanic membranes, no concern for membrane rupture.  Will treat with 7-day course of Cortisporin drops 3-4 times per day.  Follow-up if pain continues.

## 2022-04-09 ENCOUNTER — Other Ambulatory Visit: Payer: Self-pay | Admitting: Family Medicine

## 2022-04-09 DIAGNOSIS — I10 Essential (primary) hypertension: Secondary | ICD-10-CM

## 2022-04-10 ENCOUNTER — Ambulatory Visit (HOSPITAL_COMMUNITY)
Admission: RE | Admit: 2022-04-10 | Discharge: 2022-04-10 | Disposition: A | Discharge status: Home / Self Care | Payer: Medicare Other | Source: Ambulatory Visit | Attending: Family Medicine | Admitting: Family Medicine

## 2022-04-10 DIAGNOSIS — J189 Pneumonia, unspecified organism: Secondary | ICD-10-CM | POA: Insufficient documentation

## 2022-04-12 ENCOUNTER — Encounter (HOSPITAL_COMMUNITY): Payer: Self-pay

## 2022-04-12 ENCOUNTER — Observation Stay (HOSPITAL_BASED_OUTPATIENT_CLINIC_OR_DEPARTMENT_OTHER)
Admission: EM | Admit: 2022-04-12 | Discharge: 2022-04-15 | Disposition: A | Discharge status: Home / Self Care | Payer: Medicare Other | Attending: Family Medicine | Admitting: Family Medicine

## 2022-04-12 ENCOUNTER — Other Ambulatory Visit: Payer: Self-pay

## 2022-04-12 ENCOUNTER — Encounter (HOSPITAL_BASED_OUTPATIENT_CLINIC_OR_DEPARTMENT_OTHER): Payer: Self-pay | Admitting: Emergency Medicine

## 2022-04-12 ENCOUNTER — Emergency Department (HOSPITAL_BASED_OUTPATIENT_CLINIC_OR_DEPARTMENT_OTHER): Payer: Medicare Other | Admitting: Radiology

## 2022-04-12 DIAGNOSIS — K648 Other hemorrhoids: Secondary | ICD-10-CM | POA: Diagnosis not present

## 2022-04-12 DIAGNOSIS — D631 Anemia in chronic kidney disease: Secondary | ICD-10-CM | POA: Insufficient documentation

## 2022-04-12 DIAGNOSIS — K921 Melena: Secondary | ICD-10-CM | POA: Diagnosis not present

## 2022-04-12 DIAGNOSIS — I169 Hypertensive crisis, unspecified: Secondary | ICD-10-CM | POA: Diagnosis present

## 2022-04-12 DIAGNOSIS — N186 End stage renal disease: Secondary | ICD-10-CM | POA: Diagnosis not present

## 2022-04-12 DIAGNOSIS — I5022 Chronic systolic (congestive) heart failure: Secondary | ICD-10-CM | POA: Diagnosis not present

## 2022-04-12 DIAGNOSIS — I5032 Chronic diastolic (congestive) heart failure: Secondary | ICD-10-CM | POA: Diagnosis not present

## 2022-04-12 DIAGNOSIS — Z79899 Other long term (current) drug therapy: Secondary | ICD-10-CM | POA: Insufficient documentation

## 2022-04-12 DIAGNOSIS — I160 Hypertensive urgency: Secondary | ICD-10-CM | POA: Diagnosis not present

## 2022-04-12 DIAGNOSIS — I132 Hypertensive heart and chronic kidney disease with heart failure and with stage 5 chronic kidney disease, or end stage renal disease: Secondary | ICD-10-CM | POA: Insufficient documentation

## 2022-04-12 DIAGNOSIS — F17210 Nicotine dependence, cigarettes, uncomplicated: Secondary | ICD-10-CM | POA: Diagnosis not present

## 2022-04-12 DIAGNOSIS — K922 Gastrointestinal hemorrhage, unspecified: Secondary | ICD-10-CM | POA: Diagnosis not present

## 2022-04-12 DIAGNOSIS — K625 Hemorrhage of anus and rectum: Secondary | ICD-10-CM

## 2022-04-12 DIAGNOSIS — Z992 Dependence on renal dialysis: Secondary | ICD-10-CM

## 2022-04-12 LAB — BASIC METABOLIC PANEL
Anion gap: 18 — ABNORMAL HIGH (ref 5–15)
BUN: 105 mg/dL — ABNORMAL HIGH (ref 6–20)
CO2: 21 mmol/L — ABNORMAL LOW (ref 22–32)
Calcium: 8.9 mg/dL (ref 8.9–10.3)
Chloride: 99 mmol/L (ref 98–111)
Creatinine, Ser: 19.84 mg/dL — ABNORMAL HIGH (ref 0.61–1.24)
GFR, Estimated: 3 mL/min — ABNORMAL LOW (ref >60)
Glucose, Bld: 95 mg/dL (ref 70–99)
Potassium: 5 mmol/L (ref 3.5–5.1)
Sodium: 138 mmol/L (ref 135–145)

## 2022-04-12 LAB — TROPONIN I (HIGH SENSITIVITY)
Troponin I (High Sensitivity): 24 ng/L — ABNORMAL HIGH (ref <18)
Troponin I (High Sensitivity): 23 ng/L — ABNORMAL HIGH (ref <18)

## 2022-04-12 LAB — CBC WITH DIFFERENTIAL/PLATELET
Abs Immature Granulocytes: 0.01 10*3/uL (ref 0.00–0.07)
Basophils Absolute: 0 10*3/uL (ref 0.0–0.1)
Basophils Relative: 0 %
Eosinophils Absolute: 0.2 10*3/uL (ref 0.0–0.5)
Eosinophils Relative: 3 %
HCT: 32.3 % — ABNORMAL LOW (ref 39.0–52.0)
Hemoglobin: 10.2 g/dL — ABNORMAL LOW (ref 13.0–17.0)
Immature Granulocytes: 0 %
Lymphocytes Relative: 28 %
Lymphs Abs: 1.3 10*3/uL (ref 0.7–4.0)
MCH: 29.5 pg (ref 26.0–34.0)
MCHC: 31.6 g/dL (ref 30.0–36.0)
MCV: 93.4 fL (ref 80.0–100.0)
Monocytes Absolute: 0.3 10*3/uL (ref 0.1–1.0)
Monocytes Relative: 7 %
Neutro Abs: 2.8 10*3/uL (ref 1.7–7.7)
Neutrophils Relative %: 62 %
Platelets: 232 10*3/uL (ref 150–400)
RBC: 3.46 MIL/uL — ABNORMAL LOW (ref 4.22–5.81)
RDW: 15.4 % (ref 11.5–15.5)
WBC: 4.6 10*3/uL (ref 4.0–10.5)
nRBC: 0 % (ref 0.0–0.2)

## 2022-04-12 LAB — BRAIN NATRIURETIC PEPTIDE: B Natriuretic Peptide: 788.6 pg/mL — ABNORMAL HIGH (ref 0.0–100.0)

## 2022-04-12 LAB — CBG MONITORING, ED
Glucose-Capillary: 79 mg/dL (ref 70–99)
Glucose-Capillary: 78 mg/dL (ref 70–99)

## 2022-04-12 NOTE — Progress Notes (Signed)
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  HD center would not do HD due to BRBPR.  Discussed with Dr. Jonnie Finner, who recommended observation for HD.  Not on AC.  Dr. Benson Norway will consult as inpatient.      Plan of care: The patient is accepted for observation to Telemetry unit, at [facility].   Author: Karmen Bongo, MD 04/12/2022  Check [email or web address] for on-call coverage.  Nursing staff, Please call [place] number on Amion as soon as patient's arrival, so appropriate admitting provider can evaluate the pt.

## 2022-04-12 NOTE — ED Notes (Signed)
Attempted IV on pt's left AC. Removed after obtaining blood samples because pt was writhing and calling out in pain. He also cried out as the IV catheter was removed and proceeded to rub the arm all the way to the shoulder. Pt feeling better at this time.

## 2022-04-12 NOTE — ED Notes (Signed)
Tori - Triage RN aware of pt's BP.

## 2022-04-12 NOTE — ED Notes (Signed)
Pt reports that he has had rectal bleeding for weeks and that he has not been to see his gastroenterologist. Pt states he could not go to dialysis this morning because he was having multiple BMs and blood per rectum and the dialysis clinic would not perform dialysis because of that. Pt reads lips, does not hear at all.

## 2022-04-12 NOTE — ED Notes (Signed)
Pt called this nurse in to see that the toilet was full of stool and bright red blood. Even with the water dilution, there was a lot of red. Dr. Sherry Ruffing notified.

## 2022-04-12 NOTE — ED Triage Notes (Signed)
Patient presents POV reporting rectal bleeding, was scheduled to have dialysis at noon but they refused to do dialysis and told him he needed to be evaluated for his rectal bleeding. Reports feeling "shaky" CBG 79 in triage, given juice. Denies hx of diabetes but reports his sugar has been "dropping"

## 2022-04-12 NOTE — ED Notes (Signed)
Pt's CBG result was 79. Informed Tori - Triage RN.

## 2022-04-12 NOTE — ED Provider Notes (Signed)
[name] EMERGENCY DEPT Provider Note   CSN: [id number] Arrival date & time: 04/12/22  1243     History  Chief Complaint  Patient presents with   Rectal Bleeding   Missed Dialysis    [name] is a 36 y.o. male.   Rectal Bleeding      Home Medications Prior to Admission medications   Medication Sig Start Date End Date Taking? Authorizing Provider  acetaminophen (TYLENOL) 500 MG tablet Take 1,500 mg by mouth every 6 (six) hours as needed for mild pain or moderate pain.    [provider]  amLODipine (NORVASC) 10 MG tablet TAKE 1 TABLET BY MOUTH EVERY DAY 04/10/22   [name], DO  calcitRIOL (ROCALTROL) 0.25 MCG capsule Take 0.25 mcg by mouth daily. Patient not taking: Reported on 03/24/2022    [provider]  calcium acetate (PHOSLO) 667 MG capsule Take 1 capsule (667 mg total) by mouth 3 (three) times daily with meals. 01/04/22   August Albino, MD  carvedilol (COREG) 25 MG tablet Take 1 tablet (25 mg total) by mouth 2 (two) times daily. 03/05/22 04/04/22  Edward Heck, MD  hydrALAZINE (APRESOLINE) 50 MG tablet Take 1 tablet (50 mg total) by mouth every 8 (eight) hours. 03/17/22   [name], DO  isosorbide mononitrate (IMDUR) 30 MG 24 hr tablet Take 1 tablet (30 mg total) by mouth daily. 03/05/22   Edward Heck, MD  neomycin-polymyxin-hydrocortisone (CORTISPORIN) OTIC solution Place 3 drops into both ears 4 (four) times daily. For 7 days. 04/08/22   Edward Oxford, MD  pantoprazole (PROTONIX) 40 MG tablet Take 40 mg by mouth daily.    [provider]  triamcinolone (KENALOG) 0.025 % ointment Apply 1 Application topically 2 (two) times daily. 03/17/22   [name], DO      Allergies    Zestril [lisinopril], Nsaids, and Risperdal [risperidone]    Review of Systems   Review of Systems  Gastrointestinal:  Positive for hematochezia.    Physical Exam Updated Vital Signs BP (!) 179/115 (BP  Location: Left Arm)   Pulse 80   Temp 97.9 F (36.6 C) (Oral)   Resp 18   Ht 5\' 8"  (1.727 m)   Wt 77.1 kg   SpO2 100%   BMI 25.85 kg/m  Physical Exam  ED Results / Procedures / Treatments   Labs (all labs ordered are listed, but only abnormal results are displayed) Labs Reviewed  CBC WITH DIFFERENTIAL/PLATELET - Abnormal; Notable for the following components:      Result Value   RBC 3.46 (*)    Hemoglobin 10.2 (*)    HCT 32.3 (*)    All other components within normal limits  BASIC METABOLIC PANEL - Abnormal; Notable for the following components:   CO2 21 (*)    BUN 105 (*)    Creatinine, Ser 19.84 (*)    GFR, Estimated 3 (*)    Anion gap 18 (*)    All other components within normal limits  BRAIN NATRIURETIC PEPTIDE - Abnormal; Notable for the following components:   B Natriuretic Peptide 788.6 (*)    All other components within normal limits  TROPONIN I (HIGH SENSITIVITY) - Abnormal; Notable for the following components:   Troponin I (High Sensitivity) 24 (*)    All other components within normal limits  CBG MONITORING, ED  CBG MONITORING, ED  TROPONIN I (HIGH SENSITIVITY)    EKG EKG Interpretation  Date/Time:  Saturday April 12 2022 14:38:50 EST Ventricular Rate:  78 PR Interval:  157 QRS Duration: 92 QT Interval:  403 QTC Calculation: 459 R Axis:   28 Text Interpretation: Sinus rhythm ST elev, probable normal early repol pattern when comapred to prior, similar appearance. No STEMI Confirmed by Antony Blackbird [phone]) on 04/12/2022 2:54:20 PM  Radiology DG Chest 2 View  Result Date: 04/12/2022 CLINICAL DATA:  Shortness of breath. Peripheral edema. Chest pain. On dialysis. EXAM: CHEST - 2 VIEW COMPARISON:  04/10/2022 FINDINGS: The heart size and mediastinal contours are within normal limits. Both lungs are clear. The visualized skeletal structures are unremarkable. IMPRESSION: No active cardiopulmonary disease. Electronically Signed   By: Marlaine Hind M.D.    On: 04/12/2022 14:42    Procedures Procedures    Medications Ordered in ED Medications - No data to display  ED Course/ Medical Decision Making/ A&P                           Medical Decision Making Amount and/or Complexity of Data Reviewed Labs: ordered. Radiology: ordered.    [name] is a 36 y.o. male with a past medical history significant for Alport syndrome with associated ESRD on dialysis TTS, hypertension, bipolar disorder, hearing disorder and relies on lipreading, and recent report that he has new heart failure who presents with worsening shortness of breath, chest pain, cannot lay flat, but also worsened rectal bleeding preventing him from getting dialysis today.  According to patient, he has had rectal bleeding on and off for the last few weeks and over the last 24 hours it has worsened.  He reports that every time he has a bowel movement it is completely bloody.  He tried to go to dialysis this morning as normal but when he had the bloody bowel movement they told him to return later if it improved.  He then returned for dialysis and continued to have significant rectal bleeding and was told he had to come to the emergency department and could not have dialysis today.  Aside from the bleeding, he is complaining of more intermittent chest pain and shortness of breath.  Short of breath is worsened with exertion and worsen when laying flat.  He is concerned about his heart and heart failure worsening.  He reports no nausea or vomiting at this time and denies significant abdominal pain.  He reports no hemorrhoids and only has some mild rectal irritation but denies significant rectal pain.  He denies any palpitations.  Denies any syncope.  He reports feeling fatigued and lightheaded however.  On exam, lungs had some rales in the bases but otherwise chest was nontender.  Abdomen nontender.  Rectal exam with a chaperone did not have gross blood on my digital rectal exam.  Abdomen was  nontender.  No large hemorrhoids seen.  Legs are edematous which he reports has been worsening recently.   EKG shows no STEMI.  Clinically I am somewhat concerned about continued and worsening heart failure in the setting of the worsening rectal bleeding.  I am concerned the patient missed dialysis due to the continued rectal bleeding.  Patient had some screening labs including a troponin that was slightly elevated at 24, it is somewhat improved from the last time it was checked so we will trend.  BNP is also 788 which is elevated but improved from the last time it was checked 1 month ago.  Metabolic panel shows elevated creatinine as expected but potassium was normal. CBC shows no leukocytosis  and only mild anemia at 10.2.  As patient was sent from dialysis from the rectal bleeding, we will discuss with nephrology if they feel it is safe for dialyzing him.  Clinically I am concerned about the symptoms of either symptom edema versus worsening heart failure or fluid overload and patient may indeed need dialysis.  He is not hypoxic but is quite symptomatic especially when he lays flat.  We will discuss with nephrology plan.  Patient may need hemoglobin trending versus admission to the hospital for echo and dialysis.  Anticipate discussion with nephrology to determine disposition.  Care transferred to oncoming team to await discussion with nephrology and reassessment.  While awaiting to hear back from nephrology, patient just had a bowel movement that completely filled the toilet bowl with blood.  3:43 PM Spoke to nephrology and Dr. Soyla Murphy would like him admitted to medicine at [facility] to trend hemoglobin and allow safe dialysis treatment.  Patient also may need further evaluation of his shortness of breath and chest pain with an echo.  We will call medicine for admission.         Final Clinical Impression(s) / ED Diagnoses Final diagnoses:  Rectal bleeding    Clinical  Impression: 1. Rectal bleeding     Disposition: Admit  This note was prepared with assistance of Dragon voice recognition software. Occasional wrong-word or sound-a-like substitutions may have occurred due to the inherent limitations of voice recognition software.     Camree Wigington, Gwenyth Allegra, MD 04/12/22 1544

## 2022-04-12 NOTE — ED Notes (Signed)
Pt writhing and expressing intense pain at IV flushing.

## 2022-04-12 NOTE — ED Notes (Signed)
Md tegeler at bedside; hemocult slide provided

## 2022-04-13 ENCOUNTER — Encounter (HOSPITAL_COMMUNITY): Payer: Self-pay | Admitting: Family Medicine

## 2022-04-13 DIAGNOSIS — K625 Hemorrhage of anus and rectum: Secondary | ICD-10-CM | POA: Diagnosis present

## 2022-04-13 DIAGNOSIS — I160 Hypertensive urgency: Secondary | ICD-10-CM | POA: Diagnosis not present

## 2022-04-13 DIAGNOSIS — D631 Anemia in chronic kidney disease: Secondary | ICD-10-CM | POA: Diagnosis not present

## 2022-04-13 DIAGNOSIS — Z992 Dependence on renal dialysis: Secondary | ICD-10-CM | POA: Diagnosis not present

## 2022-04-13 DIAGNOSIS — F17210 Nicotine dependence, cigarettes, uncomplicated: Secondary | ICD-10-CM | POA: Diagnosis not present

## 2022-04-13 DIAGNOSIS — I5032 Chronic diastolic (congestive) heart failure: Secondary | ICD-10-CM | POA: Diagnosis not present

## 2022-04-13 DIAGNOSIS — Z79899 Other long term (current) drug therapy: Secondary | ICD-10-CM | POA: Diagnosis not present

## 2022-04-13 DIAGNOSIS — I132 Hypertensive heart and chronic kidney disease with heart failure and with stage 5 chronic kidney disease, or end stage renal disease: Secondary | ICD-10-CM | POA: Diagnosis not present

## 2022-04-13 DIAGNOSIS — K922 Gastrointestinal hemorrhage, unspecified: Secondary | ICD-10-CM | POA: Diagnosis not present

## 2022-04-13 DIAGNOSIS — N186 End stage renal disease: Secondary | ICD-10-CM | POA: Diagnosis not present

## 2022-04-13 DIAGNOSIS — K648 Other hemorrhoids: Secondary | ICD-10-CM | POA: Diagnosis not present

## 2022-04-13 DIAGNOSIS — K921 Melena: Secondary | ICD-10-CM | POA: Diagnosis not present

## 2022-04-13 DIAGNOSIS — I169 Hypertensive crisis, unspecified: Secondary | ICD-10-CM | POA: Diagnosis present

## 2022-04-13 LAB — RENAL FUNCTION PANEL
Albumin: 3 g/dL — ABNORMAL LOW (ref 3.5–5.0)
Anion gap: 17 — ABNORMAL HIGH (ref 5–15)
BUN: 110 mg/dL — ABNORMAL HIGH (ref 6–20)
CO2: 17 mmol/L — ABNORMAL LOW (ref 22–32)
Calcium: 8.4 mg/dL — ABNORMAL LOW (ref 8.9–10.3)
Chloride: 103 mmol/L (ref 98–111)
Creatinine, Ser: 21.54 mg/dL — ABNORMAL HIGH (ref 0.61–1.24)
GFR, Estimated: 3 mL/min — ABNORMAL LOW (ref >60)
Glucose, Bld: 122 mg/dL — ABNORMAL HIGH (ref 70–99)
Phosphorus: 10.2 mg/dL — ABNORMAL HIGH (ref 2.5–4.6)
Potassium: 4.8 mmol/L (ref 3.5–5.1)
Sodium: 137 mmol/L (ref 135–145)

## 2022-04-13 LAB — HEMATOCRIT
HCT: 29.2 % — ABNORMAL LOW (ref 39.0–52.0)
HCT: 28.8 % — ABNORMAL LOW (ref 39.0–52.0)
HCT: 27.4 % — ABNORMAL LOW (ref 39.0–52.0)
HCT: 27.8 % — ABNORMAL LOW (ref 39.0–52.0)

## 2022-04-13 LAB — HEMOGLOBIN
Hemoglobin: 9.2 g/dL — ABNORMAL LOW (ref 13.0–17.0)
Hemoglobin: 9.2 g/dL — ABNORMAL LOW (ref 13.0–17.0)
Hemoglobin: 9.1 g/dL — ABNORMAL LOW (ref 13.0–17.0)
Hemoglobin: 9 g/dL — ABNORMAL LOW (ref 13.0–17.0)

## 2022-04-13 LAB — CBC
HCT: 27.2 % — ABNORMAL LOW (ref 39.0–52.0)
Hemoglobin: 9.1 g/dL — ABNORMAL LOW (ref 13.0–17.0)
MCH: 30.1 pg (ref 26.0–34.0)
MCHC: 33.5 g/dL (ref 30.0–36.0)
MCV: 90.1 fL (ref 80.0–100.0)
Platelets: 209 10*3/uL (ref 150–400)
RBC: 3.02 MIL/uL — ABNORMAL LOW (ref 4.22–5.81)
RDW: 15.7 % — ABNORMAL HIGH (ref 11.5–15.5)
WBC: 4.7 10*3/uL (ref 4.0–10.5)
nRBC: 0 % (ref 0.0–0.2)

## 2022-04-13 LAB — BASIC METABOLIC PANEL
Anion gap: 19 — ABNORMAL HIGH (ref 5–15)
BUN: 108 mg/dL — ABNORMAL HIGH (ref 6–20)
CO2: 14 mmol/L — ABNORMAL LOW (ref 22–32)
Calcium: 8.6 mg/dL — ABNORMAL LOW (ref 8.9–10.3)
Chloride: 105 mmol/L (ref 98–111)
Creatinine, Ser: 20.78 mg/dL — ABNORMAL HIGH (ref 0.61–1.24)
GFR, Estimated: 3 mL/min — ABNORMAL LOW (ref >60)
Glucose, Bld: 82 mg/dL (ref 70–99)
Potassium: 5 mmol/L (ref 3.5–5.1)
Sodium: 138 mmol/L (ref 135–145)

## 2022-04-13 LAB — HEPATITIS B SURFACE ANTIGEN: Hepatitis B Surface Ag: NONREACTIVE

## 2022-04-13 LAB — TYPE AND SCREEN
ABO/RH(D): O POS
Antibody Screen: NEGATIVE

## 2022-04-13 LAB — GLUCOSE, CAPILLARY: Glucose-Capillary: 122 mg/dL — ABNORMAL HIGH (ref 70–99)

## 2022-04-13 MED ORDER — SODIUM CHLORIDE 0.9% FLUSH
3.0000 mL | Freq: Two times a day (BID) | INTRAVENOUS | Status: DC
  Administered 2022-04-13 – 2022-04-14 (×3): 3 mL via INTRAVENOUS

## 2022-04-13 MED ORDER — ONDANSETRON HCL 4 MG PO TABS: 4.0000 mg | ORAL_TABLET | Freq: Four times a day (QID) | ORAL | Status: DC | PRN

## 2022-04-13 MED ORDER — CARVEDILOL 25 MG PO TABS
25.0000 mg | ORAL_TABLET | Freq: Two times a day (BID) | ORAL | Status: DC
  Administered 2022-04-13 – 2022-04-15 (×4): 25 mg via ORAL
  Filled 2022-04-13 (×4): qty 1

## 2022-04-13 MED ORDER — ONDANSETRON HCL 4 MG/2ML IJ SOLN: 4.0000 mg | Freq: Four times a day (QID) | INTRAMUSCULAR | Status: DC | PRN

## 2022-04-13 MED ORDER — HYDRALAZINE HCL 50 MG PO TABS
50.0000 mg | ORAL_TABLET | Freq: Three times a day (TID) | ORAL | Status: DC
  Administered 2022-04-13 – 2022-04-15 (×6): 50 mg via ORAL
  Filled 2022-04-13 (×6): qty 1

## 2022-04-13 MED ORDER — LABETALOL HCL 5 MG/ML IV SOLN: 10.0000 mg | INTRAVENOUS | Status: DC | PRN

## 2022-04-13 MED ORDER — CHLORHEXIDINE GLUCONATE CLOTH 2 % EX PADS
6.0000 | MEDICATED_PAD | Freq: Every day | CUTANEOUS | Status: DC
  Administered 2022-04-14: 6 via TOPICAL

## 2022-04-13 MED ORDER — PANTOPRAZOLE SODIUM 40 MG PO TBEC
40.0000 mg | DELAYED_RELEASE_TABLET | Freq: Every day | ORAL | Status: DC
  Administered 2022-04-13 – 2022-04-15 (×3): 40 mg via ORAL
  Filled 2022-04-13 (×3): qty 1

## 2022-04-13 MED ORDER — SODIUM CHLORIDE 0.9 % IV SOLN: 250.0000 mL | INTRAVENOUS | Status: DC | PRN

## 2022-04-13 MED ORDER — CALCIUM ACETATE (PHOS BINDER) 667 MG PO CAPS
667.0000 mg | ORAL_CAPSULE | Freq: Three times a day (TID) | ORAL | Status: DC
  Administered 2022-04-13 – 2022-04-15 (×3): 667 mg via ORAL
  Filled 2022-04-13 (×3): qty 1

## 2022-04-13 MED ORDER — ACETAMINOPHEN 325 MG PO TABS
650.0000 mg | ORAL_TABLET | Freq: Four times a day (QID) | ORAL | Status: DC | PRN
  Administered 2022-04-13 – 2022-04-15 (×5): 650 mg via ORAL
  Filled 2022-04-13 (×5): qty 2

## 2022-04-13 MED ORDER — CALCIUM ACETATE (PHOS BINDER) 667 MG PO CAPS: 667.0000 mg | ORAL_CAPSULE | Freq: Three times a day (TID) | ORAL | Status: DC

## 2022-04-13 MED ORDER — ACETAMINOPHEN 650 MG RE SUPP: 650.0000 mg | Freq: Four times a day (QID) | RECTAL | Status: DC | PRN

## 2022-04-13 MED ORDER — HYDROMORPHONE HCL 1 MG/ML IJ SOLN
0.5000 mg | INTRAMUSCULAR | Status: DC | PRN
  Administered 2022-04-13: 0.5 mg via INTRAVENOUS
  Filled 2022-04-13: qty 0.5

## 2022-04-13 MED ORDER — LABETALOL HCL 5 MG/ML IV SOLN
10.0000 mg | INTRAVENOUS | Status: DC | PRN
  Administered 2022-04-13 – 2022-04-14 (×2): 20 mg via INTRAVENOUS
  Filled 2022-04-13 (×2): qty 4

## 2022-04-13 MED ORDER — SODIUM CHLORIDE 0.9% FLUSH: 3.0000 mL | INTRAVENOUS | Status: DC | PRN

## 2022-04-13 MED ORDER — NEOMYCIN-POLYMYXIN-HC 3.5-10000-1 OT SOLN
3.0000 [drp] | Freq: Four times a day (QID) | OTIC | Status: AC
  Administered 2022-04-13 – 2022-04-15 (×6): 3 [drp] via OTIC
  Filled 2022-04-13: qty 10

## 2022-04-13 MED ORDER — AMLODIPINE BESYLATE 10 MG PO TABS
10.0000 mg | ORAL_TABLET | Freq: Every day | ORAL | Status: DC
  Administered 2022-04-13 – 2022-04-15 (×2): 10 mg via ORAL
  Filled 2022-04-13 (×2): qty 1

## 2022-04-13 MED ORDER — SODIUM CHLORIDE 0.9% FLUSH
3.0000 mL | Freq: Two times a day (BID) | INTRAVENOUS | Status: DC
  Administered 2022-04-13 – 2022-04-15 (×3): 3 mL via INTRAVENOUS

## 2022-04-13 MED ORDER — ISOSORBIDE MONONITRATE ER 30 MG PO TB24
30.0000 mg | ORAL_TABLET | Freq: Every day | ORAL | Status: DC
  Administered 2022-04-13 – 2022-04-15 (×2): 30 mg via ORAL
  Filled 2022-04-13 (×2): qty 1

## 2022-04-13 NOTE — Hospital Course (Addendum)
[name] is a 36 y.o.male with a history of ESRD 2/2 Alport syndrome, HTN, chronic HFrEF, chronic loose stools who was admitted to the [facility] via transfer from [facility] for rectal bleeding and need for dialysis. His hospital course is detailed below:  Hematochezia Patient admitted for rectal bleeding x 2 days. Ongoing, intermittent issue for over 1 year previously worked up by GI in 10/2021 and found to be benign at that time. Hgb decreased to ~9 (baseline around 13). Underwent flex sig on 12/4 which showed internal and external hemorrhoids. Fiber supplement and hemorrhoid cream started prior to discharge.  ESRD Patient missed one dialysis session prior to admission due to rectal bleeding. Transferred from OSH for HD. Nephrology consulted and patient received make up dialysis session on 12/4 and was advised to continue regularly T/Th/Sa HD.  Resistant HTN Patient hypertensive with climbing BP up to 230s/110s. Likely secondary to pain vs missed dialysis session. Managed with hydralazine, labetalol prn and pain control.  PCP follow up: Blood pressure management Evaluation of leg cramps and spams

## 2022-04-13 NOTE — Progress Notes (Addendum)
   04/13/22 1840  Vitals  Temp 98.2 F (36.8 C)  Temp Source Oral  BP (!) 169/110  MAP (mmHg) 128  BP Location Left Arm  BP Method Automatic  Patient Position (if appropriate) Lying  Pulse Rate 81  Pulse Rate Source Monitor  Resp 18  Level of Consciousness  Level of Consciousness Alert  MEWS COLOR  MEWS Score Color Green  Oxygen Therapy  SpO2 100 %  O2 Device Room Air  Pain Assessment  Pain Scale 0-10  Pain Score 10  Pain Type Acute pain  Pain Location Head  Pain Orientation Right;Left;Anterior;Posterior  Pain Descriptors / Indicators Aching;Heaviness;Grimacing;Crying;Discomfort;Headache;Pressure  Pain Frequency Constant  Pain Onset Gradual  Patients Stated Pain Goal 0  Pain Intervention(s) Medication (See eMAR);Pain med given for lower pain score than stated, per patient request;MD notified (Comment);Repositioned;Emotional support;Prayer  Multiple Pain Sites Yes  Complaints & Interventions  Complains of Nausea /  Vomiting  PCA/Epidural/Spinal Assessment  Respiratory Pattern Shallow;Regular  MEWS Score  MEWS Temp 0  MEWS Systolic 0  MEWS Pulse 0  MEWS RR 0  MEWS LOC 0  MEWS Score 0  Provider Notification  Provider Name/Title [facility] and [facility] Hickman  Date Provider Notified 04/13/22  Time Provider Notified [id number]  Method of Notification Page  Notification Reason Change in status  Provider response En route;At bedside;See new orders  Date of Provider Response 04/13/22  Time of Provider Response 1842   Patient had a sudden change in status after receiving a shower, and medications. Complaint of the worst headache and nausea. Vitals taken and MD requested to come to bedside. MD enroute and came and evaluated patient. Orders were placed. Patient still restless. Handoff givent to night nurse.

## 2022-04-13 NOTE — Consult Note (Signed)
Renal Service Consult Note [facility]  [name] 04/13/2022 Sol Blazing, MD Requesting Physician: Dr. Erin Hearing  Reason for Consult: ESRD pt missed HD due to rectal bleeding HPI: The patient is a 36 y.o. year-old w/ PMH as below who presented to ED 12/2 due to rectal bleeding and missed HD. Pt reported numerous BM's for last few days and passing BRB w/ each of these.  No abd or n/v. Has had > 1 yr of intermittent rectal bleeding like this. In ED pt afeb, BP  a bit high, HR wnl, RR wnl. Hb 10.2, BNP 789, BUN 105, creat 20, K5.0. CXR negative. Pt was admitted and GI consulted. We are asked to see for dialysis.   Pt seen in room. Hx of Alport's, he is deaf since childhood, his CKD was discovered in this early 86s. Has been on HD since 2021.     ROS - denies CP, no joint pain, no HA, no blurry vision, no rash, no diarrhea, no nausea/ vomiting, no dysuria, no difficulty voiding   Past Medical History  Past Medical History:  Diagnosis Date   Bipolar 1 disorder ([place])    CKD (chronic kidney disease)    on dialysis   Depression    GERD (gastroesophageal reflux disease)    Hearing difficulty of left ear    75% hearing   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   Hypertension    Low blood sugar    Renal disorder    Past Surgical History  Past Surgical History:  Procedure Laterality Date   APPENDECTOMY     AV FISTULA PLACEMENT Left 03/03/2019   Procedure: ARTERIOVENOUS (AV) FISTULA CREATION LEFT ARM;  Surgeon: Waynetta Sandy, MD;  Location: [facility];  Service: Vascular;  Laterality: Left;   [place] Right 04/12/2019   Procedure: RIGHT UPPER EXTREMITY [place];  Surgeon: Angelia Mould, MD;  Location: [facility];  Service: Vascular;  Laterality: Right;   BIOPSY  10/14/2021   Procedure: BIOPSY;  Surgeon: Doran Stabler, MD;  Location: [facility];  Service: Gastroenterology;;    COLONOSCOPY WITH PROPOFOL N/A 10/14/2021   Procedure: COLONOSCOPY WITH PROPOFOL;  Surgeon: Doran Stabler, MD;  Location: [facility];  Service: Gastroenterology;  Laterality: N/A;   LIGATION OF ARTERIOVENOUS  FISTULA Left 03/06/2019   Procedure: LIGATION OF ARTERIOVENOUS  FISTULA;  Surgeon: Marty Heck, MD;  [facility];  Service: Vascular;  Laterality: Left;   spinal tap     SPINE SURGERY     related to a spinal infection, unsure of surgery or infection source   WISDOM TOOTH EXTRACTION     Family History  Family History  Problem Relation Age of Onset   Hypertension Mother    Kidney failure Mother    Diabetes Mother    Hearing loss Father    Hypertension Sister    Heart disease Maternal Grandmother    Stroke Maternal Grandfather    Asthma Son    Social History  reports that he has been smoking cigarettes. He has been smoking an average of .3 packs per day. He has never used smokeless tobacco. He reports that he does not currently use alcohol. He reports current drug use. Drug: Marijuana. Allergies  Allergies  Allergen Reactions   Zestril [Lisinopril] Swelling   Nsaids Other (See Comments)    "Kidney problems "   Risperdal [Risperidone] Other (See Comments)  Unknown reaction    Home medications Prior to Admission medications   Medication Sig Start Date End Date Taking? Authorizing Provider  acetaminophen (TYLENOL) 500 MG tablet Take 1,500 mg by mouth every 6 (six) hours as needed for mild pain or moderate pain.    [provider]  amLODipine (NORVASC) 10 MG tablet TAKE 1 TABLET BY MOUTH EVERY DAY 04/10/22   [name], DO  calcitRIOL (ROCALTROL) 0.25 MCG capsule Take 0.25 mcg by mouth daily. Patient not taking: Reported on 03/24/2022    [provider]  calcium acetate (PHOSLO) 667 MG capsule Take 1 capsule (667 mg total) by mouth 3 (three) times daily with meals. 01/04/22   August Albino, MD  carvedilol (COREG) 25 MG tablet Take 1  tablet (25 mg total) by mouth 2 (two) times daily. 03/05/22 04/04/22  Gerrit Heck, MD  hydrALAZINE (APRESOLINE) 50 MG tablet Take 1 tablet (50 mg total) by mouth every 8 (eight) hours. 03/17/22   [name], DO  isosorbide mononitrate (IMDUR) 30 MG 24 hr tablet Take 1 tablet (30 mg total) by mouth daily. 03/05/22   Gerrit Heck, MD  neomycin-polymyxin-hydrocortisone (CORTISPORIN) OTIC solution Place 3 drops into both ears 4 (four) times daily. For 7 days. 04/08/22   Salvadore Oxford, MD  pantoprazole (PROTONIX) 40 MG tablet Take 40 mg by mouth daily.    [provider]  triamcinolone (KENALOG) 0.025 % ointment Apply 1 Application topically 2 (two) times daily. 03/17/22   [name], DO     Vitals:   04/12/22 1901 04/12/22 2300 04/12/22 2305 04/13/22 0347  BP:   (!) 172/96 (!) 166/107  Pulse:   89 82  Resp:   19 20  Temp: 98.8 F (37.1 C) 98.8 F (37.1 C)  98.3 F (36.8 C)  TempSrc: Oral   Oral  SpO2:   98% 99%  Weight:      Height:       Exam Gen alert, no distress No rash, cyanosis or gangrene Sclera anicteric, throat clear  No jvd or bruits Chest clear bilat to bases, no rales/ wheezing RRR no MRG Abd soft ntnd no mass or ascites +bs GU normal male MS no joint effusions or deformity Ext no LE or UE edema, no wounds or ulcers Neuro is alert, Ox 3 , nf    RUA AVF+ bruit      Home meds include - novasc 10, coreg 25 bid, hydralazine 50 tid, imdur 30, protonix, prns/ vits/ supps     OP HD: TTS [facility]    4h  400/1.5  72kg  2/2 bath  Heparin 1200  RUA AVF - last HD 11/28, post wt 73.7kg - calcitriol 1.75 ug po tiw - mircera 100 ug q2, last 11/28  Assessment/ Plan: Lower GI bleeding - passing blood w/ each BM. Hb 10.2. GI consulting. Has colonoscopy in June 2023 showed diverticulosis only, no there acute findings.  ESRD - on HD TTS. Missed HD Sat due to bleeding.  HTNsive urgency - cont home meds w/ prn IV meds. Get vol down w/ HD tomorrow.   Volume - is up 5 kg, max UF goal w/ HD tomorrow.  MBD ckd - Ca in range, cont vdra tts.  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 Just had OP esa on 11/28. Follow, transfuse prn.       Kelly Splinter  MD 04/13/2022, 2:13 PM Recent Labs  Lab 04/12/22 1351 04/13/22 0537 04/13/22 1020  HGB 10.2* 9.2* 9.2*  CALCIUM 8.9 8.6*  --  CREATININE 19.84* 20.78*  --   K 5.0 5.0  --    Inpatient medications:  amLODipine  10 mg Oral Daily   carvedilol  25 mg Oral BID   hydrALAZINE  50 mg Oral Q8H   isosorbide mononitrate  30 mg Oral Daily   neomycin-polymyxin-hydrocortisone  3 drop Both EARS QID   pantoprazole  40 mg Oral Daily   sodium chloride flush  3 mL Intravenous Q12H   sodium chloride flush  3 mL Intravenous Q12H    sodium chloride     sodium chloride, acetaminophen **OR** acetaminophen, labetalol, ondansetron **OR** ondansetron (ZOFRAN) IV, sodium chloride flush

## 2022-04-13 NOTE — Progress Notes (Signed)
Daily Progress Note Intern Pager: [phone]  Patient name: [name] Medical record number: [id number] Date of birth: 09-28-1985 Age: 36 y.o. Gender: male  Primary Care Provider: [name], DO Consultants: Nephrology, GI Code Status: Full  Pt Overview and Major Events to Date:  12/2: Admitted to [facility] at [place] 12/3: Transfer to [facility] and care resumed by FMTS  Assessment and Plan: [name] is a 36 y.o. male presenting as transfer from [facility] for missed dialysis session and additionally hematochezia.  Pertinent PMH/PSH includes Alport syndrome, ESRD on HD, chronic HFrEF, hypertension.  * Acute lower GI bleeding Intermittent rectal bleeding with hematochezia for the past few weeks. Hgb worsened 10.2>9.2 (baseline ~13). Seen by GI in the past and colonoscopy performed in June 2023 notable for diverticulosis of the sigmoid colon.   -serial H&H q6h -GI following, appreciate recommendations -Flex sig 12/4, n.p.o. after midnight -monitor for bleeding -vitals per routine -continue protonix  -hold VTE ppx, apply SCDs -consider obtaining ferritin and iron labs  ESRD (end stage renal disease) on dialysis ([place]) HD on T/Th/Sa. Missed on 12/2 due to GI bleeding.  Transferred to [facility] for dialysis, case discussed with Dr. Burnett Sheng of nephrology by [facility] ED physician -nephrology consulted and following for HD as appropriate -avoid nephrotoxic agents  -renally dose medications as appropriate  -continue calcitriol  Hypertensive urgency Likely to improve with dialysis as has been known to do in the past.  Home meds include amlodipine 10 mg daily, coreg 25 mg twice daily and hydralazine 50 mg 3 times daily, Imdur 30 mg daily. -continue home medications -monitor BP  Heart failure with mildly reduced ejection fraction (HFmrEF) (HCC) Acute on chronic, prior echo notable for EF 40-45% with mildly decreased left  ventricular function and global hypokinesis of LV with small to moderate pericardial effusion from Oct 2023. CXR notable for no active cardiopulmonary disease. BNP upon admission elevated at 788.6. On exam, patient appears hypervolemic likely multifactorial in the setting of chronic HF and missed HD session.  -consider repeat echo -monitor I/Os closely -daily weights -consider lasix  FEN/GI: regular diet, NPO at midnight PPx: None 2/2 acute GI bleed Dispo: pending clinical improvement   Subjective:  States he had 3 bloody bowel movements this morning.  His bowel movements are generally diarrheal and grossly bloody, denies abdominal pain but endorses a full sensation until he defecates.  States he has begun feeling somewhat weak from having these episodes.  He was unable to receive dialysis yesterday due to his GI bleeding.  Endorses regularly taking his blood pressure medications.  Objective: Temp:  [97.9 F (36.6 C)-98.8 F (37.1 C)] 98.3 F (36.8 C) (12/03 0347) Pulse Rate:  [80-89] 82 (12/03 0347) Resp:  [14-20] 20 (12/03 0347) BP: (166-179)/(92-117) 166/107 (12/03 0347) SpO2:  [96 %-100 %] 99 % (12/03 0347) Weight:  [77.1 kg] 77.1 kg (12/02 1310) Physical Exam: General: Awake, alert, NAD, comfortable in bed Cardiovascular: RRR, 3/6 systolic murmur at left sternal border Respiratory: CTAB, normal WOB Abdomen: Soft, nontender, hyperactive bowel sounds Extremities: +1 pitting edema of BLEs  Laboratory: Most recent CBC Lab Results  Component Value Date   WBC 4.6 04/12/2022   HGB 9.2 (L) 04/13/2022   HCT 28.8 (L) 04/13/2022   MCV 93.4 04/12/2022   PLT 232 04/12/2022   Most recent BMP    Latest Ref Rng & Units 04/13/2022    5:37 AM  BMP  Glucose 70 -  99 mg/dL 82   BUN 6 - 20 mg/dL 108   Creatinine 0.61 - 1.24 mg/dL 20.78   Sodium 135 - 145 mmol/L 138   Potassium 3.5 - 5.1 mmol/L 5.0   Chloride 98 - 111 mmol/L 105   CO2 22 - 32 mmol/L 14   Calcium 8.9 - 10.3 mg/dL 8.6     Imaging/Diagnostic Tests: No new imaging [name], DO 04/13/2022, 12:40 PM  PGY-2, [place] Intern pager: [phone], text pages welcome Secure chat group [place]

## 2022-04-13 NOTE — Progress Notes (Signed)
FMTS Interim Progress Note  S: Notified by RN to come see patient for very painful headache.  Went to see patient with Dr. Ruben Im.  Patient was swinging back-and-forth saying that his entire head and jaw hurt.  Upon further questioning, notes that he has had headaches/migraines to this degree before.  He received Tylenol 650 mg by RN which did not seem helpful.  RN notes that he has been hypertensive throughout the day and has still had bloody bowel movements but this is a new presentation for him.  It happened directly after receiving his phosphate binder.  O: BP (!) 169/110   Pulse 81   Temp 98.3 F (36.8 C) (Oral)   Resp 20   Ht 5\' 8"  (1.727 m)   Wt 77.1 kg   SpO2 100%   BMI 25.85 kg/m   General: Awake, alert, uncomfortable appearing CV: RRR, no murmurs auscultated Pulm: CTAB, normal WOB Extremities: Dialysis catheter patent Neuro: A&O x 3, PERRL, EOMI, neck ROM intact, pain with palpation of scalp, able to follow commands  A/P: Headache Headache versus migraine versus hypertensive response.  IV labetalol 20 mg given, BP responsive to this.  Hyperglycemic to 122.  Hemoglobin on most recent H&H showed minimal change at 9.1.  No appreciable EKG changes on just prior EKG.  Do not suspect this was related to the phosphate binder nor his acute GI bleed.  I am not currently suspicious of subarachnoid hemorrhage given endorsement of headaches to the same degree as this. -Stat CBC and RFP -Low threshold for stat CT head -Consider Dilaudid 0.5 mg as needed -Night team to recheck in 1 hour  [name], DO 04/13/2022, 7:35 PM PGY-2, [place] Medicine Service pager [phone]

## 2022-04-13 NOTE — Consult Note (Signed)
CONSULT NOTE FOR  GI  Reason for Consult: Hematochezia Referring Physician: [facility]  Aydrian L Goecke HPI: This is a 36 year old male with a PMH of [place] syndrome, ESRD on HD (TTS), HFmrEF (40-45% 02/2022), and history of intermittent hematochezia.  The patient was evaluated earlier in the summer on 10/14/2021 with a colonoscopy by Dr. Loletha Carrow for his complaints of diarrhea and hematochezia.  The random biopsies of the colonic mucosa were normal.  A few small sigmoid diverticula were found.  The conclusion by Dr. Loletha Carrow was that he has IBS-D and an anal source of hematochezia.  For this admission he presented with complaints of diarrhea and hematochezia.  He states having multiple bloody bowel movements over the past couple of days.  The blood is noted in the toilet and he reports that it is voluminous.  He denies any proctalgia, but he does have pruritus.  The patient insists that the bleeding is not from an anal source, but more proximal in the colon.  In the ER his HGB was in the 10 range, but today it is at 9.2 g/dL, however, he has a large variance for his baseline HGB.  Past Medical History:  Diagnosis Date   Bipolar 1 disorder ([place])    CKD (chronic kidney disease)    on dialysis   Depression    GERD (gastroesophageal reflux disease)    Hearing difficulty of left ear    75% hearing   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   Hypertension    Low blood sugar    Renal disorder     Past Surgical History:  Procedure Laterality Date   APPENDECTOMY     AV FISTULA PLACEMENT Left 03/03/2019   Procedure: ARTERIOVENOUS (AV) FISTULA CREATION LEFT ARM;  Surgeon: Waynetta Sandy, MD;  Location: [place];  Service: Vascular;  Laterality: Left;   [employer] Right 04/12/2019   Procedure: RIGHT UPPER EXTREMITY [place];  Surgeon: Angelia Mould, MD;  Location: [place];  Service: Vascular;  Laterality: Right;    BIOPSY  10/14/2021   Procedure: BIOPSY;  Surgeon: Doran Stabler, MD;  Location[facility];  Service: Gastroenterology;;   COLONOSCOPY WITH PROPOFOL N/A 10/14/2021   Procedure: COLONOSCOPY WITH PROPOFOL;  Surgeon: Doran Stabler, MD;  Location: [facility];  Service: Gastroenterology;  Laterality: N/A;   LIGATION OF ARTERIOVENOUS  FISTULA Left 03/06/2019   Procedure: LIGATION OF ARTERIOVENOUS  FISTULA;  Surgeon: Marty Heck, MD;  [facility]: [facility];  Service: Vascular;  Laterality: Left;   spinal tap     SPINE SURGERY     related to a spinal infection, unsure of surgery or infection source   WISDOM TOOTH EXTRACTION      Family History  Problem Relation Age of Onset   Hypertension Mother    Kidney failure Mother    Diabetes Mother    Hearing loss Father    Hypertension Sister    Heart disease Maternal Grandmother    Stroke Maternal Grandfather    Asthma Son     Social History:  reports that he has been smoking cigarettes. He has been smoking an average of .3 packs per day. He has never used smokeless tobacco. He reports that he does not currently use alcohol. He reports current drug use. Drug: Marijuana.  Allergies:  Allergies  Allergen Reactions   Zestril [Lisinopril] Swelling   Nsaids Other (See Comments)    "  Kidney problems "   Risperdal [Risperidone] Other (See Comments)    Unknown reaction     Medications: Scheduled:  amLODipine  10 mg Oral Daily   carvedilol  25 mg Oral BID   hydrALAZINE  50 mg Oral Q8H   isosorbide mononitrate  30 mg Oral Daily   neomycin-polymyxin-hydrocortisone  3 drop Both EARS QID   pantoprazole  40 mg Oral Daily   sodium chloride flush  3 mL Intravenous Q12H   sodium chloride flush  3 mL Intravenous Q12H   Continuous:  sodium chloride      Results for orders placed or performed during the hospital encounter of 04/12/22 (from the past 24 hour(s))  CBG monitoring, ED     Status: None   Collection Time: 04/12/22  1:18 PM   Result Value Ref Range   Glucose-Capillary 79 70 - 99 mg/dL   Comment 1 RN AWARE   CBC with Differential     Status: Abnormal   Collection Time: 04/12/22  1:51 PM  Result Value Ref Range   WBC 4.6 4.0 - 10.5 K/uL   RBC 3.46 (L) 4.22 - 5.81 MIL/uL   Hemoglobin 10.2 (L) 13.0 - 17.0 g/dL   HCT 32.3 (L) 39.0 - 52.0 %   MCV 93.4 80.0 - 100.0 fL   MCH 29.5 26.0 - 34.0 pg   MCHC 31.6 30.0 - 36.0 g/dL   RDW 15.4 11.5 - 15.5 %   Platelets 232 150 - 400 K/uL   nRBC 0.0 0.0 - 0.2 %   Neutrophils Relative % 62 %   Neutro Abs 2.8 1.7 - 7.7 K/uL   Lymphocytes Relative 28 %   Lymphs Abs 1.3 0.7 - 4.0 K/uL   Monocytes Relative 7 %   Monocytes Absolute 0.3 0.1 - 1.0 K/uL   Eosinophils Relative 3 %   Eosinophils Absolute 0.2 0.0 - 0.5 K/uL   Basophils Relative 0 %   Basophils Absolute 0.0 0.0 - 0.1 K/uL   Immature Granulocytes 0 %   Abs Immature Granulocytes 0.01 0.00 - 0.07 K/uL  Basic metabolic panel     Status: Abnormal   Collection Time: 04/12/22  1:51 PM  Result Value Ref Range   Sodium 138 135 - 145 mmol/L   Potassium 5.0 3.5 - 5.1 mmol/L   Chloride 99 98 - 111 mmol/L   CO2 21 (L) 22 - 32 mmol/L   Glucose, Bld 95 70 - 99 mg/dL   BUN 105 (H) 6 - 20 mg/dL   Creatinine, Ser 19.84 (H) 0.61 - 1.24 mg/dL   Calcium 8.9 8.9 - 10.3 mg/dL   GFR, Estimated 3 (L) >60 mL/min   Anion gap 18 (H) 5 - 15  CBG monitoring, ED     Status: None   Collection Time: 04/12/22  2:34 PM  Result Value Ref Range   Glucose-Capillary 78 70 - 99 mg/dL  Troponin I (High Sensitivity)     Status: Abnormal   Collection Time: 04/12/22  2:42 PM  Result Value Ref Range   Troponin I (High Sensitivity) 24 (H) <18 ng/L  Brain natriuretic peptide     Status: Abnormal   Collection Time: 04/12/22  2:42 PM  Result Value Ref Range   B Natriuretic Peptide 788.6 (H) 0.0 - 100.0 pg/mL  Troponin I (High Sensitivity)     Status: Abnormal   Collection Time: 04/12/22  4:44 PM  Result Value Ref Range   Troponin I (High  Sensitivity) 23 (H) <18 ng/L  Type  and screen [place]     Status: None   Collection Time: 04/13/22  5:30 AM  Result Value Ref Range   ABO/RH(D) O POS    Antibody Screen NEG    Sample Expiration      04/16/2022,2359 Performed at [facility] [address] [place], [place] [address]   Basic metabolic panel     Status: Abnormal   Collection Time: 04/13/22  5:37 AM  Result Value Ref Range   Sodium 138 135 - 145 mmol/L   Potassium 5.0 3.5 - 5.1 mmol/L   Chloride 105 98 - 111 mmol/L   CO2 14 (L) 22 - 32 mmol/L   Glucose, Bld 82 70 - 99 mg/dL   BUN 108 (H) 6 - 20 mg/dL   Creatinine, Ser 20.78 (H) 0.61 - 1.24 mg/dL   Calcium 8.6 (L) 8.9 - 10.3 mg/dL   GFR, Estimated 3 (L) >60 mL/min   Anion gap 19 (H) 5 - 15  Hemoglobin     Status: Abnormal   Collection Time: 04/13/22  5:37 AM  Result Value Ref Range   Hemoglobin 9.2 (L) 13.0 - 17.0 g/dL  Hematocrit     Status: Abnormal   Collection Time: 04/13/22  5:37 AM  Result Value Ref Range   HCT 29.2 (L) 39.0 - 52.0 %     DG Chest 2 View  Result Date: 04/12/2022 CLINICAL DATA:  Shortness of breath. Peripheral edema. Chest pain. On dialysis. EXAM: CHEST - 2 VIEW COMPARISON:  04/10/2022 FINDINGS: The heart size and mediastinal contours are within normal limits. Both lungs are clear. The visualized skeletal structures are unremarkable. IMPRESSION: No active cardiopulmonary disease. Electronically Signed   By: Marlaine Hind M.D.   On: 04/12/2022 14:42    ROS:  As stated above in the HPI otherwise negative.  Blood pressure (!) 166/107, pulse 82, temperature 98.3 F (36.8 C), temperature source Oral, resp. rate 20, height 5\' 8"  (1.727 m), weight 77.1 kg, SpO2 99 %.    PE: Gen: NAD, Alert and Oriented HEENT:  [place]/AT, EOMI Neck: Supple, no LAD Lungs: CTA Bilaterally CV: RRR without M/G/R ABD: Soft, NTND, +BS Ext: No C/C/E  Assessment/Plan: 1) Hematochezia. 2) Diarrhea. 3) ESRD secondary to [place]  syndrome.   His description is consistent with a hemorrhoidal source, but he feels that it is more proximal.  With the acute bleeding, the pretest probability to localize the bleeding is higher.  It is unclear how his diarrhea is worse during this admission.  Plan: 1) Flex sig tomorrow with [facility].  Mersadies Petree D 04/13/2022, 8:56 AM

## 2022-04-13 NOTE — Assessment & Plan Note (Addendum)
Intermittent rectal bleeding with hematochezia for the past few weeks. Hgb10.2>>9.2 after serial checks (baseline Hgb fluctuates greatly, seemingly ~10-12?). Seen by GI in the past and colonoscopy performed in June 2023 notable for diverticulosis of the sigmoid colon. Had a BM this AM without bleeding.  - Scheduled for flex sig today - Trend Hgb - Continue protonix  - Hold VTE ppx d/t bleeding, on SCDs - Consider obtaining ferritin and iron labs

## 2022-04-13 NOTE — Assessment & Plan Note (Deleted)
Acute on chronic, prior echo notable for EF 40-45% with mildly decreased left ventricular function and global hypokinesis of LV with small to moderate pericardial effusion from Oct 2023. CXR notable for no active cardiopulmonary disease. BNP upon admission elevated at 788.6. On exam, patient does not have pitting edema, will be getting HD today. - Consider repeat echo - Monitor I/Os closely - Daily weights - Consider lasix if hypervolemic

## 2022-04-13 NOTE — Assessment & Plan Note (Addendum)
HD on T/Th/Sa. Missed on 12/2 due to GI bleeding.  - Nephrology consulted and recommended HD today as well as regularly scheduled HD tomorrow - Avoid nephrotoxic agents  - Renally dose medications as appropriate  - Continue calcitriol

## 2022-04-13 NOTE — H&P (View-Only) (Signed)
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  The patient was evaluated earlier in the summer on 10/14/2021 with a colonoscopy by Dr. Loletha [name] for his complaints of diarrhea and hematochezia.  The random biopsies of the colonic mucosa were normal.  A few small sigmoid diverticula were found.  The conclusion by Dr. Loletha [name] was that he has IBS-[name] and an anal source of hematochezia.  For this admission he presented with complaints of diarrhea and hematochezia.  He states having multiple bloody bowel movements over the past couple of days.  The blood is noted in the toilet and he reports that it is voluminous.  He denies any proctalgia, but he does have pruritus.  The patient insists that the bleeding is not from an anal source, but more proximal in the colon.  In the ER his HGB was in the 10 range, but today it is at 9.2 g/dL, however, he has a large variance for his baseline HGB.  Past Medical History:  Diagnosis Date   Bipolar 1 disorder ([place])    CKD (chronic kidney disease)    on dialysis   Depression    GERD (gastroesophageal reflux disease)    Hearing difficulty of left ear    75% hearing   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   Hypertension    Low blood sugar    Renal disorder     Past Surgical History:  Procedure Laterality Date   APPENDECTOMY     AV FISTULA PLACEMENT Left 03/03/2019   Procedure: ARTERIOVENOUS (AV) FISTULA CREATION LEFT ARM;  Surgeon: Edward Sandy, MD;  Location: [place];  Service: Vascular;  Laterality: Left;   [place] Right 04/12/2019   Procedure: RIGHT UPPER EXTREMITY [place];  Surgeon: Edward Mould, MD;  Location: [facility];  Service: Vascular;  Laterality: Right;    BIOPSY  10/14/2021   Procedure: BIOPSY;  Surgeon: Edward Stabler, MD;  [facility];  Service: Gastroenterology;;   COLONOSCOPY WITH PROPOFOL N/A 10/14/2021   Procedure: COLONOSCOPY WITH PROPOFOL;  Surgeon: Edward Stabler, MD;  [facility];  Service: Gastroenterology;  Laterality: N/A;   LIGATION OF ARTERIOVENOUS  FISTULA Left 03/06/2019   Procedure: LIGATION OF ARTERIOVENOUS  FISTULA;  Surgeon: Edward Heck, MD;  Location: [facility] OR;  Service: Vascular;  Laterality: Left;   spinal tap     SPINE SURGERY     related to a spinal infection, unsure of surgery or infection source   WISDOM TOOTH EXTRACTION      Family History  Problem Relation Age of Onset   Hypertension Mother    Kidney failure Mother    Diabetes Mother    Hearing loss Father    Hypertension Sister    Heart disease Maternal Grandmother    Stroke Maternal Grandfather    Asthma Son     Social History:  reports that he has been smoking cigarettes. He has been smoking an average of .3 packs per day. He has never used smokeless tobacco. He reports that he does not currently use alcohol. He reports current drug use. Drug: Marijuana.  Allergies:  Allergies  Allergen Reactions   Zestril [Lisinopril] Swelling   Nsaids Other (See Comments)    "  Kidney problems "   Risperdal [Risperidone] Other (See Comments)    Unknown reaction     Medications: Scheduled:  amLODipine  10 mg Oral Daily   carvedilol  25 mg Oral BID   hydrALAZINE  50 mg Oral Q8H   isosorbide mononitrate  30 mg Oral Daily   neomycin-polymyxin-hydrocortisone  3 drop Both EARS QID   pantoprazole  40 mg Oral Daily   sodium chloride flush  3 mL Intravenous Q12H   sodium chloride flush  3 mL Intravenous Q12H   Continuous:  sodium chloride      Results for orders placed or performed during the hospital encounter of 04/12/22 (from the past 24 hour(s))  CBG monitoring, ED     Status: None   Collection Time: 04/12/22  1:18 PM   Result Value Ref Range   Glucose-Capillary 79 70 - 99 mg/dL   Comment 1 RN AWARE   CBC with Differential     Status: Abnormal   Collection Time: 04/12/22  1:51 PM  Result Value Ref Range   WBC 4.6 4.0 - 10.5 K/uL   RBC 3.46 (L) 4.22 - 5.81 MIL/uL   Hemoglobin 10.2 (L) 13.0 - 17.0 g/dL   HCT 32.3 (L) 39.0 - 52.0 %   MCV 93.4 80.0 - 100.0 fL   MCH 29.5 26.0 - 34.0 pg   MCHC 31.6 30.0 - 36.0 g/dL   RDW 15.4 11.5 - 15.5 %   Platelets 232 150 - 400 K/uL   nRBC 0.0 0.0 - 0.2 %   Neutrophils Relative % 62 %   Neutro Abs 2.8 1.7 - 7.7 K/uL   Lymphocytes Relative 28 %   Lymphs Abs 1.3 0.7 - 4.0 K/uL   Monocytes Relative 7 %   Monocytes Absolute 0.3 0.1 - 1.0 K/uL   Eosinophils Relative 3 %   Eosinophils Absolute 0.2 0.0 - 0.5 K/uL   Basophils Relative 0 %   Basophils Absolute 0.0 0.0 - 0.1 K/uL   Immature Granulocytes 0 %   Abs Immature Granulocytes 0.01 0.00 - 0.07 K/uL  Basic metabolic panel     Status: Abnormal   Collection Time: 04/12/22  1:51 PM  Result Value Ref Range   Sodium 138 135 - 145 mmol/L   Potassium 5.0 3.5 - 5.1 mmol/L   Chloride 99 98 - 111 mmol/L   CO2 21 (L) 22 - 32 mmol/L   Glucose, Bld 95 70 - 99 mg/dL   BUN 105 (H) 6 - 20 mg/dL   Creatinine, Ser 19.84 (H) 0.61 - 1.24 mg/dL   Calcium 8.9 8.9 - 10.3 mg/dL   GFR, Estimated 3 (L) >60 mL/min   Anion gap 18 (H) 5 - 15  CBG monitoring, ED     Status: None   Collection Time: 04/12/22  2:34 PM  Result Value Ref Range   Glucose-Capillary 78 70 - 99 mg/dL  Troponin I (High Sensitivity)     Status: Abnormal   Collection Time: 04/12/22  2:42 PM  Result Value Ref Range   Troponin I (High Sensitivity) 24 (H) <18 ng/L  Brain natriuretic peptide     Status: Abnormal   Collection Time: 04/12/22  2:42 PM  Result Value Ref Range   B Natriuretic Peptide 788.6 (H) 0.0 - 100.0 pg/mL  Troponin I (High Sensitivity)     Status: Abnormal   Collection Time: 04/12/22  4:44 PM  Result Value Ref Range   Troponin I (High  Sensitivity) 23 (H) <18 ng/L  Type  and screen [place]     Status: None   Collection Time: 04/13/22  5:30 AM  Result Value Ref Range   ABO/RH([name]) O POS    Antibody Screen NEG    Sample Expiration      04/16/2022,2359 Performed at [facility] [address] [place], [place] [address]   Basic metabolic panel     Status: Abnormal   Collection Time: 04/13/22  5:37 AM  Result Value Ref Range   Sodium 138 135 - 145 mmol/L   Potassium 5.0 3.5 - 5.1 mmol/L   Chloride 105 98 - 111 mmol/L   CO2 14 (L) 22 - 32 mmol/L   Glucose, Bld 82 70 - 99 mg/dL   BUN 108 (H) 6 - 20 mg/dL   Creatinine, Ser 20.78 (H) 0.61 - 1.24 mg/dL   Calcium 8.6 (L) 8.9 - 10.3 mg/dL   GFR, Estimated 3 (L) >60 mL/min   Anion gap 19 (H) 5 - 15  Hemoglobin     Status: Abnormal   Collection Time: 04/13/22  5:37 AM  Result Value Ref Range   Hemoglobin 9.2 (L) 13.0 - 17.0 g/dL  Hematocrit     Status: Abnormal   Collection Time: 04/13/22  5:37 AM  Result Value Ref Range   HCT 29.2 (L) 39.0 - 52.0 %     DG Chest 2 View  Result Date: 04/12/2022 CLINICAL DATA:  Shortness of breath. Peripheral edema. Chest pain. On dialysis. EXAM: CHEST - 2 VIEW COMPARISON:  04/10/2022 FINDINGS: The heart size and mediastinal contours are within normal limits. Both lungs are clear. The visualized skeletal structures are unremarkable. IMPRESSION: No active cardiopulmonary disease. Electronically Signed   By: Marlaine Hind M.[name].   On: 04/12/2022 14:42    ROS:  As stated above in the HPI otherwise negative.  Blood pressure (!) 166/107, pulse 82, temperature 98.3 F (36.8 C), temperature source Oral, resp. rate 20, height 5\' 8"  (1.727 m), weight 77.1 kg, SpO2 99 %.    PE: Gen: NAD, Alert and Oriented HEENT:  [place]/AT, EOMI Neck: Supple, no LAD Lungs: CTA Bilaterally CV: RRR without M/G/R ABD: Soft, NTND, +BS Ext: No C/C/E  Assessment/Plan: 1) Hematochezia. 2) Diarrhea. 3) ESRD secondary to [place]  syndrome.   His description is consistent with a hemorrhoidal source, but he feels that it is more proximal.  With the acute bleeding, the pretest probability to localize the bleeding is higher.  It is unclear how his diarrhea is worse during this admission.  Plan: 1) Flex sig tomorrow with [facility].  Gurkaran Edward [name] 04/13/2022, 8:56 AM

## 2022-04-13 NOTE — Plan of Care (Signed)

## 2022-04-13 NOTE — H&P (Signed)
History and Physical    [name] EPP:[id number] DOB: 19-Dec-1985 DOA: 04/12/2022  PCP: [name], DO   Patient coming from: Home   Chief Complaint: Rectal bleeding   HPI: [name] is a 36 y.o. male with medical history significant for ESRD due to [place] syndrome, hypertension, chronic HFmrEF, and chronic loose stools who presents to the emergency department with rectal bleeding.  Patient reports multiple bowel movements daily for the last 2 days and has passed bright red blood with each of these.  He denies abdominal pain, nausea, or vomiting associated with this.  He reports lightheadedness but no syncope.  Patient has greater than 1 year history of intermittent rectal bleeding for which she had seen [facility] previously and had colonoscopy in June 2023 notable for diverticulosis but otherwise normal.  Per gastroenterology, the overall clinical picture was most consistent with IBS and benign anal bleeding at that time.  [facility] ED Course: Upon arrival to the ED, patient is found to be afebrile and saturating well on room air with normal heart rate and elevated blood pressure.  Hemoglobin was 10.2, troponin slightly elevated and unchanged on repeat, BNP was 789 BUN 105, potassium 5.0, and serum bicarbonate 21.  Chest x-ray was negative for acute findings.    ED physician discussed the case with Dr. Jonnie Finner of nephrology who recommended medical admission to [facility] for dialysis and serial H&H.  Dr. Benson Norway of GI was consulted by the accepting physician.    Dr. Larae Grooms with the [name] program was notified of patient's arrival to [facility] and agreed that they would assume [name] care this morning.   Review of Systems:  All other systems reviewed and apart from HPI, are negative.  Past Medical History:  Diagnosis Date   Bipolar 1 disorder ([place])    CKD (chronic kidney disease)    on dialysis   Depression    GERD (gastroesophageal  reflux disease)    Hearing difficulty of left ear    75% hearing   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   Hypertension    Low blood sugar    Renal disorder     Past Surgical History:  Procedure Laterality Date   APPENDECTOMY     AV FISTULA PLACEMENT Left 03/03/2019   Procedure: ARTERIOVENOUS (AV) FISTULA CREATION LEFT ARM;  Surgeon: Waynetta Sandy, MD;  Location: [place];  Service: Vascular;  Laterality: Left;   [place] Right 04/12/2019   Procedure: RIGHT UPPER EXTREMITY [place];  Surgeon: Angelia Mould, MD;  Location: [place];  Service: Vascular;  Laterality: Right;   BIOPSY  10/14/2021   Procedure: BIOPSY;  Surgeon: Doran Stabler, MD;  Location: [facility];  Service: Gastroenterology;;   COLONOSCOPY WITH PROPOFOL N/A 10/14/2021   Procedure: COLONOSCOPY WITH PROPOFOL;  Surgeon: Doran Stabler, MD;  Location: [facility];  Service: Gastroenterology;  Laterality: N/A;   LIGATION OF ARTERIOVENOUS  FISTULA Left 03/06/2019   Procedure: LIGATION OF ARTERIOVENOUS  FISTULA;  Surgeon: Marty Heck, MD;  [facility];  Service: Vascular;  Laterality: Left;   spinal tap     SPINE SURGERY     related to a spinal infection, unsure of surgery or infection source   WISDOM TOOTH EXTRACTION      Social History:   reports that he has been smoking cigarettes. He has been smoking an average of .3 packs  per day. He has never used smokeless tobacco. He reports that he does not currently use alcohol. He reports current drug use. Drug: Marijuana.  Allergies  Allergen Reactions   Zestril [Lisinopril] Swelling   Nsaids Other (See Comments)    "Kidney problems "   Risperdal [Risperidone] Other (See Comments)    Unknown reaction     Family History  Problem Relation Age of Onset   Hypertension Mother    Kidney failure Mother    Diabetes Mother    Hearing loss Father    Hypertension Sister     Heart disease Maternal Grandmother    Stroke Maternal Grandfather    Asthma Son      Prior to Admission medications   Medication Sig Start Date End Date Taking? Authorizing Provider  acetaminophen (TYLENOL) 500 MG tablet Take 1,500 mg by mouth every 6 (six) hours as needed for mild pain or moderate pain.    [provider]  amLODipine (NORVASC) 10 MG tablet TAKE 1 TABLET BY MOUTH EVERY DAY 04/10/22   [name], DO  calcitRIOL (ROCALTROL) 0.25 MCG capsule Take 0.25 mcg by mouth daily. Patient not taking: Reported on 03/24/2022    [provider]  calcium acetate (PHOSLO) 667 MG capsule Take 1 capsule (667 mg total) by mouth 3 (three) times daily with meals. 01/04/22   August Albino, MD  carvedilol (COREG) 25 MG tablet Take 1 tablet (25 mg total) by mouth 2 (two) times daily. 03/05/22 04/04/22  Gerrit Heck, MD  hydrALAZINE (APRESOLINE) 50 MG tablet Take 1 tablet (50 mg total) by mouth every 8 (eight) hours. 03/17/22   [name], DO  isosorbide mononitrate (IMDUR) 30 MG 24 hr tablet Take 1 tablet (30 mg total) by mouth daily. 03/05/22   Gerrit Heck, MD  neomycin-polymyxin-hydrocortisone (CORTISPORIN) OTIC solution Place 3 drops into both ears 4 (four) times daily. For 7 days. 04/08/22   Salvadore Oxford, MD  pantoprazole (PROTONIX) 40 MG tablet Take 40 mg by mouth daily.    [provider]  triamcinolone (KENALOG) 0.025 % ointment Apply 1 Application topically 2 (two) times daily. 03/17/22   [name], DO    Physical Exam: Vitals:   04/12/22 1901 04/12/22 2300 04/12/22 2305 04/13/22 0347  BP:   (!) 172/96 (!) 166/107  Pulse:   89 82  Resp:   19 20  Temp: 98.8 F (37.1 C) 98.8 F (37.1 C)  98.3 F (36.8 C)  TempSrc: Oral   Oral  SpO2:   98% 99%  Weight:      Height:         Constitutional: NAD, no pallor or diaphoresis   Eyes: PERTLA, lids and conjunctivae normal ENMT: Mucous membranes are moist. Posterior pharynx  clear of any exudate or lesions.   Neck: supple, no masses  Respiratory: no wheezing, no crackles. No accessory muscle use.  Cardiovascular: S1 & S2 heard, regular rate and rhythm. No JVD. Abdomen: No distension, no tenderness, soft. Bowel sounds active.  Musculoskeletal: no clubbing / cyanosis. No joint deformity upper and lower extremities.   Skin: no significant rashes, lesions, ulcers. Warm, dry, well-perfused. Neurologic: Gross hearing deficit. Moving all extremities. Alert and oriented.  Psychiatric: Calm. Cooperative.    Labs and Imaging on Admission: I have personally reviewed following labs and imaging studies  CBC: Recent Labs  Lab 04/12/22 1351  WBC 4.6  NEUTROABS 2.8  HGB 10.2*  HCT 32.3*  MCV 93.4  PLT 263   Basic Metabolic Panel: Recent Labs  Lab 04/12/22 1351  NA 138  K 5.0  CL 99  CO2 21*  GLUCOSE 95  BUN 105*  CREATININE 19.84*  CALCIUM 8.9   GFR: Estimated Creatinine Clearance: 5.1 mL/min (A) (by C-G formula based on SCr of 19.84 mg/dL (H)). Liver Function Tests: No results for input(s): "AST", "ALT", "ALKPHOS", "BILITOT", "PROT", "ALBUMIN" in the last 168 hours. No results for input(s): "LIPASE", "AMYLASE" in the last 168 hours. No results for input(s): "AMMONIA" in the last 168 hours. Coagulation Profile: No results for input(s): "INR", "PROTIME" in the last 168 hours. Cardiac Enzymes: No results for input(s): "CKTOTAL", "CKMB", "CKMBINDEX", "TROPONINI" in the last 168 hours. BNP (last 3 results) No results for input(s): "PROBNP" in the last 8760 hours. HbA1C: No results for input(s): "HGBA1C" in the last 72 hours. CBG: Recent Labs  Lab 04/12/22 1318 04/12/22 1434  GLUCAP 79 78   Lipid Profile: No results for input(s): "CHOL", "HDL", "LDLCALC", "TRIG", "CHOLHDL", "LDLDIRECT" in the last 72 hours. Thyroid Function Tests: No results for input(s): "TSH", "T4TOTAL", "FREET4", "T3FREE", "THYROIDAB" in the last 72 hours. Anemia Panel: No  results for input(s): "VITAMINB12", "FOLATE", "FERRITIN", "TIBC", "IRON", "RETICCTPCT" in the last 72 hours. Urine analysis:    Component Value Date/Time   COLORURINE STRAW (A) 06/07/2021 1758   APPEARANCEUR CLEAR 06/07/2021 1758   LABSPEC 1.008 06/07/2021 1758   PHURINE 6.0 06/07/2021 1758   GLUCOSEU 50 (A) 06/07/2021 1758   HGBUR SMALL (A) 06/07/2021 1758   BILIRUBINUR NEGATIVE 06/07/2021 1758   KETONESUR NEGATIVE 06/07/2021 1758   PROTEINUR >=300 (A) 06/07/2021 1758   UROBILINOGEN 0.2 05/29/2013 1121   NITRITE NEGATIVE 06/07/2021 1758   LEUKOCYTESUR NEGATIVE 06/07/2021 1758   Sepsis Labs: @LABRCNTIP (procalcitonin:4,lacticidven:4) )No results found for this or any previous visit (from the past 240 hour(s)).   Radiological Exams on Admission: DG Chest 2 View  Result Date: 04/12/2022 CLINICAL DATA:  Shortness of breath. Peripheral edema. Chest pain. On dialysis. EXAM: CHEST - 2 VIEW COMPARISON:  04/10/2022 FINDINGS: The heart size and mediastinal contours are within normal limits. Both lungs are clear. The visualized skeletal structures are unremarkable. IMPRESSION: No active cardiopulmonary disease. Electronically Signed   By: Marlaine Hind M.D.   On: 04/12/2022 14:42    EKG: Independently reviewed. Sinus rhythm, LVH.   Assessment/Plan  1. Lower GI bleeding  - Pt with months of intermittent BRBPR now passing red blood with each BM  - Initial Hgb is 10.2, HR has been normal, and BP elevated  - Etiology not identified on colonoscopy in June 2023 which was notable for diverticulosis  - Type and screen, follow serial H&H, follow-up GI recommendations    2. ESRD  - Missed HD on 12/2 d/t GI bleeding  - BP elevated and uremia may be contributing to bleed but he is stable on rm air, potassium is normal, and no indication for urgent HD overnight  - Restrict fluids, renally-dose medications, consult nephrology in am    3. Chronic HFmrEF  - Appears compensated, volume to be managed  with dialysis    4. Hypertensive urgency  - Anticipate improvement with HD  - Continue home antihypertensive regimen and use labetalol as needed     DVT prophylaxis: SCDs  Code Status: Full  Level of Care: Level of care: Telemetry Medical Family Communication: none present  Disposition Plan:  Patient is from: home  Anticipated d/c is to: Home  Anticipated d/c date is: 04/14/22  Patient currently: Pending GI consultation, stable H&H, likely inpatient HD  Consults called: GI  Admission status: Observation     Vianne Bulls, MD [facility]  04/13/2022, 4:46 AM

## 2022-04-13 NOTE — Progress Notes (Signed)
FMTS Interim Progress Note  S: Patient assessed at bedside with Dr. Arby Barrette. Patient complaining of significant headache, the pain is mostly in the back of head near his neck but also states his "whole face hurts". States he is sensitive to the light. Denies vision changes, focal deficits or worsening chest pain. Patient states he would like someone to look at his legs because they "jump around" but this is an ongoing issue for the past few months. States he is still have bloody stools but it has been unchanged since admission.  O: BP (!) 169/110   Pulse 81   Temp 98.3 F (36.8 C) (Oral)   Resp 20   Ht 5\' 8"  (1.727 m)   Wt 77.1 kg   SpO2 100%   BMI 25.85 kg/m    General: Well-appearing. Alert. NAD HEENT: Normocephalic. White sclera.  Pulm: Normal effort on RA Ext: Well perfused. Bilateral LE without erythema, edema or warmth. Skin: Warm, dry. No rashes noted  Neuro: Moves all extremities spontaneously.  Hgb 9.1 (unchanged)  A/P: Headache: Chronic episodic, likely migraine vs BP related vs tension HA. CT head on 03/08/22 for similar symptoms was negative for acute findings.  -Labetalol 10-20mg  q4h prn SBP > 160 or DBP > 95 -Tylenol 650mg  q6h prn and Dilaudid 0.5mg  q4h prn for pain management -Consider repeat CT head if worsening sx despite pain medication or clinically worsening -Monitor for neurologic changes  Bilateral leg spasm: Chronic, intermittent issue, likely secondary to metabolic changes due to missed dialysis session. -Further evaluation and work up per day team -Make-up HD session scheduled for 12/4  Remainder of plan per day team.   Colletta Maryland, MD 04/13/2022, 7:25 PM PGY-1, [place] [facility] pager [phone]

## 2022-04-13 NOTE — Assessment & Plan Note (Addendum)
Likely to improve with dialysis as has been known to do in the past. Home meds include amlodipine 10 mg daily, coreg 25 mg twice daily and hydralazine 50 mg 3 times daily, Imdur 30 mg daily. - Continue home medications - prn labetalol until he receives HD - Monitor BP

## 2022-04-14 ENCOUNTER — Encounter (HOSPITAL_COMMUNITY): Payer: Self-pay | Admitting: Family Medicine

## 2022-04-14 ENCOUNTER — Encounter (HOSPITAL_COMMUNITY)
Admission: EM | Disposition: A | Discharge status: Home / Self Care | Payer: Self-pay | Source: Home / Self Care | Attending: Emergency Medicine

## 2022-04-14 ENCOUNTER — Ambulatory Visit: Payer: Medicaid Other | Admitting: Family Medicine

## 2022-04-14 ENCOUNTER — Observation Stay (HOSPITAL_BASED_OUTPATIENT_CLINIC_OR_DEPARTMENT_OTHER): Payer: Medicare Other | Admitting: Anesthesiology

## 2022-04-14 ENCOUNTER — Observation Stay (HOSPITAL_COMMUNITY): Payer: Medicare Other | Admitting: Anesthesiology

## 2022-04-14 DIAGNOSIS — I120 Hypertensive chronic kidney disease with stage 5 chronic kidney disease or end stage renal disease: Secondary | ICD-10-CM | POA: Diagnosis not present

## 2022-04-14 DIAGNOSIS — K922 Gastrointestinal hemorrhage, unspecified: Secondary | ICD-10-CM | POA: Diagnosis not present

## 2022-04-14 DIAGNOSIS — Z992 Dependence on renal dialysis: Secondary | ICD-10-CM

## 2022-04-14 DIAGNOSIS — F17200 Nicotine dependence, unspecified, uncomplicated: Secondary | ICD-10-CM

## 2022-04-14 DIAGNOSIS — K648 Other hemorrhoids: Secondary | ICD-10-CM | POA: Diagnosis not present

## 2022-04-14 DIAGNOSIS — K921 Melena: Secondary | ICD-10-CM | POA: Diagnosis not present

## 2022-04-14 DIAGNOSIS — N186 End stage renal disease: Secondary | ICD-10-CM | POA: Diagnosis not present

## 2022-04-14 DIAGNOSIS — D631 Anemia in chronic kidney disease: Secondary | ICD-10-CM

## 2022-04-14 DIAGNOSIS — K625 Hemorrhage of anus and rectum: Secondary | ICD-10-CM

## 2022-04-14 HISTORY — PX: FLEXIBLE SIGMOIDOSCOPY: SHX5431

## 2022-04-14 LAB — BASIC METABOLIC PANEL
Anion gap: 16 — ABNORMAL HIGH (ref 5–15)
BUN: 114 mg/dL — ABNORMAL HIGH (ref 6–20)
CO2: 16 mmol/L — ABNORMAL LOW (ref 22–32)
Calcium: 8.6 mg/dL — ABNORMAL LOW (ref 8.9–10.3)
Chloride: 104 mmol/L (ref 98–111)
Creatinine, Ser: 22.38 mg/dL — ABNORMAL HIGH (ref 0.61–1.24)
GFR, Estimated: 2 mL/min — ABNORMAL LOW (ref >60)
Glucose, Bld: 89 mg/dL (ref 70–99)
Potassium: 5.2 mmol/L — ABNORMAL HIGH (ref 3.5–5.1)
Sodium: 136 mmol/L (ref 135–145)

## 2022-04-14 LAB — HEMOGLOBIN: Hemoglobin: 9.2 g/dL — ABNORMAL LOW (ref 13.0–17.0)

## 2022-04-14 LAB — HEMATOCRIT: HCT: 28.2 % — ABNORMAL LOW (ref 39.0–52.0)

## 2022-04-14 SURGERY — SIGMOIDOSCOPY, FLEXIBLE
Anesthesia: Monitor Anesthesia Care

## 2022-04-14 MED ORDER — HYDROCORT-PRAMOXINE (PERIANAL) 2.5-1 % EX CREA: TOPICAL_CREAM | Freq: Two times a day (BID) | CUTANEOUS | 0 refills | Status: DC

## 2022-04-14 MED ORDER — PSYLLIUM 95 % PO PACK
1.0000 | PACK | Freq: Three times a day (TID) | ORAL | Status: DC
  Administered 2022-04-14 – 2022-04-15 (×2): 1 via ORAL
  Filled 2022-04-14 (×2): qty 1

## 2022-04-14 MED ORDER — PROPOFOL 500 MG/50ML IV EMUL
INTRAVENOUS | Status: DC | PRN
  Administered 2022-04-14: 150 ug/kg/min via INTRAVENOUS

## 2022-04-14 MED ORDER — CALCIUM POLYCARBOPHIL 625 MG PO TABS: 625.0000 mg | ORAL_TABLET | Freq: Every day | ORAL | 0 refills | Status: DC

## 2022-04-14 MED ORDER — PROPOFOL 10 MG/ML IV BOLUS
INTRAVENOUS | Status: DC | PRN
  Administered 2022-04-14: 30 mg via INTRAVENOUS
  Administered 2022-04-14: 20 mg via INTRAVENOUS
  Administered 2022-04-14: 30 mg via INTRAVENOUS

## 2022-04-14 MED ORDER — HYDRALAZINE HCL 50 MG PO TABS
50.0000 mg | ORAL_TABLET | Freq: Once | ORAL | Status: AC
  Administered 2022-04-14: 50 mg via ORAL
  Filled 2022-04-14: qty 1

## 2022-04-14 MED ORDER — LIDOCAINE 2% (20 MG/ML) 5 ML SYRINGE
INTRAMUSCULAR | Status: DC | PRN
  Administered 2022-04-14: 100 mg via INTRAVENOUS

## 2022-04-14 MED ORDER — HYDRALAZINE HCL 20 MG/ML IJ SOLN
10.0000 mg | Freq: Once | INTRAMUSCULAR | Status: AC
  Administered 2022-04-14: 10 mg via INTRAVENOUS

## 2022-04-14 MED ORDER — HYDRALAZINE HCL 20 MG/ML IJ SOLN
INTRAMUSCULAR | Status: AC
  Filled 2022-04-14: qty 1

## 2022-04-14 MED ORDER — ACETAMINOPHEN 325 MG PO TABS: 650.0000 mg | ORAL_TABLET | Freq: Four times a day (QID) | ORAL | Status: DC | PRN

## 2022-04-14 MED ORDER — CALCIUM POLYCARBOPHIL 625 MG PO TABS
625.0000 mg | ORAL_TABLET | Freq: Every day | ORAL | Status: DC
  Administered 2022-04-15: 625 mg via ORAL
  Filled 2022-04-14 (×2): qty 1

## 2022-04-14 MED ORDER — SODIUM CHLORIDE 0.9 % IV SOLN
INTRAVENOUS | Status: AC | PRN
  Administered 2022-04-14: 500 mL via INTRAMUSCULAR

## 2022-04-14 MED ORDER — HYDROMORPHONE HCL 2 MG PO TABS
1.0000 mg | ORAL_TABLET | Freq: Once | ORAL | Status: AC
  Administered 2022-04-14: 1 mg via ORAL
  Filled 2022-04-14: qty 1

## 2022-04-14 MED ORDER — HYDROCORT-PRAMOXINE (PERIANAL) 2.5-1 % EX CREA
TOPICAL_CREAM | Freq: Two times a day (BID) | CUTANEOUS | Status: DC
  Filled 2022-04-14: qty 30

## 2022-04-14 NOTE — Progress Notes (Signed)
FMTS Interim Progress Note  S: Patient assessed at bedside with Dr. Arby Barrette. Patient received HD today and Hydralazine dose was held. BP in 200s/110s and he was given a dose of Labetalol 20mg  without BP improvement. Denies visual changes and focal deficit. States he is having an intense headache with photophobia.  Patient relates concern that he is bleeding in his stomach since he can "taste the blood". States he is still having very loose stool and has to use the bathroom whenever eating food.  O: BP (!) 211/111   Pulse 87   Temp (!) 97.1 F (36.2 C) (Oral)   Resp 13   Ht 5\' 8"  (1.727 m)   Wt 77.1 kg   SpO2 99%   BMI 25.84 kg/m    General: Well-appearing. Alert. NAD HEENT: Normocephalic. White sclera. EOMI intact CV: RRR on telemtry Pulm: Normal effort on RA Neuro: Moves all extremities spontaneous without focal deficit  A/P: HTN: Likely secondary to pain vs missed dose of BP medication vs dialysis needs. -Spot dose hydralazine 50mg  -Pain management with Dilaudid 1mg  once -Monitor BP  Diarrhea Rectal bleeding Flex sig showed likely hemorrhoidal source of bleeding. Diarrhea likely functional per GI. -Analpram cream 2.5% BID -Metamucil TID, substitute for Benefiber recommended by GI   Colletta Maryland, MD 04/14/2022, 7:27 PM PGY-1, [place] [facility] pager [phone]

## 2022-04-14 NOTE — Progress Notes (Signed)
[facility] Progress Note   Subjective:   Upset this AM due to cleaning issue with room - pt relations spoke to him and he felt better.  For flex sig this AM.  Migraine HA noted yesterday improved.    Objective Vitals:   04/13/22 0347 04/13/22 1840 04/13/22 1930 04/14/22 0446  BP: (!) 166/107 (!) 169/110 (!) 152/89 (!) 162/94  Pulse: 82 81 83 87  Resp: 20 18 15 18   Temp: 98.3 F (36.8 C) 98.2 F (36.8 C) 98.1 F (36.7 C) 98.1 F (36.7 C)  TempSrc: Oral Oral Oral Oral  SpO2: 99% 100% 100% 99%  Weight:      Height:       Physical Exam General: sitting in chair comfortable Heart: RRR Lungs: clear Abdomen: soft Extremities: no edema Dialysis Access:  RUE megafistula +t/b  Additional Objective Labs: Basic Metabolic Panel: Recent Labs  Lab 04/13/22 0537 04/13/22 1924 04/14/22 0403  NA 138 137 136  K 5.0 4.8 5.2*  CL 105 103 104  CO2 14* 17* 16*  GLUCOSE 82 122* 89  BUN 108* 110* 114*  CREATININE 20.78* 21.54* 22.38*  CALCIUM 8.6* 8.4* 8.6*  PHOS  --  10.2*  --    Liver Function Tests: Recent Labs  Lab 04/13/22 1924  ALBUMIN 3.0*   No results for input(s): "LIPASE", "AMYLASE" in the last 168 hours. CBC: Recent Labs  Lab 04/12/22 1351 04/13/22 0537 04/13/22 1924 04/13/22 2242 04/14/22 0403  WBC 4.6  --  4.7  --   --   NEUTROABS 2.8  --   --   --   --   HGB 10.2*   < > 9.1* 9.0* 9.2*  HCT 32.3*   < > 27.2* 27.8* 28.2*  MCV 93.4  --  90.1  --   --   PLT 232  --  209  --   --    < > = values in this interval not displayed.   Blood Culture    Component Value Date/Time   SDES BLOOD RIGHT HAND 03/03/2022 0441   SPECREQUEST  03/03/2022 0441    BOTTLES DRAWN AEROBIC AND ANAEROBIC Blood Culture adequate volume   CULT  03/03/2022 0441    NO GROWTH 5 DAYS Performed at [facility], [place] [address], [place], [place] [address]    REPTSTATUS 03/08/2022 FINAL 03/03/2022 0441    Cardiac Enzymes: No results for input(s): "CKTOTAL",  "CKMB", "CKMBINDEX", "TROPONINI" in the last 168 hours. CBG: Recent Labs  Lab 04/12/22 1318 04/12/22 1434 04/13/22 1845  GLUCAP 79 78 122*   Iron Studies: No results for input(s): "IRON", "TIBC", "TRANSFERRIN", "FERRITIN" in the last 72 hours. @lablastinr3 @ Studies/Results: DG Chest 2 View  Result Date: 04/12/2022 CLINICAL DATA:  Shortness of breath. Peripheral edema. Chest pain. On dialysis. EXAM: CHEST - 2 VIEW COMPARISON:  04/10/2022 FINDINGS: The heart size and mediastinal contours are within normal limits. Both lungs are clear. The visualized skeletal structures are unremarkable. IMPRESSION: No active cardiopulmonary disease. Electronically Signed   By: Marlaine Hind M.D.   On: 04/12/2022 14:42   Medications:  sodium chloride      amLODipine  10 mg Oral Daily   calcium acetate  667 mg Oral TID WC   carvedilol  25 mg Oral BID   Chlorhexidine Gluconate Cloth  6 each Topical Q0600   hydrALAZINE  50 mg Oral Q8H   isosorbide mononitrate  30 mg Oral Daily   neomycin-polymyxin-hydrocortisone  3 drop Both EARS QID  pantoprazole  40 mg Oral Daily   sodium chloride flush  3 mL Intravenous Q12H   sodium chloride flush  3 mL Intravenous Q12H    OP HD: TTS NW    4h  400/1.5  72kg  2/2 bath  Heparin 1200  RUA AVF - last HD 11/28, post wt 73.7kg - calcitriol 1.75 ug po tiw - mircera 100 ug q2, last 11/28   Assessment/ Plan: Lower GI bleeding - passing blood w/ each BM. Hb 10.2 > now in 9s x 3 serialchecks. GI consulting. Has colonoscopy in June 2023 showed diverticulosis only, otherwise no acute findings. Per notes plan flex sig today.  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 Missed HD Sat due to bleeding.  Plan make up today after GI procedure and resume usual schedule Tues (here vs outpt).  HTN- cont home meds w/ prn IV meds. Get vol down w/ HD today.  Volume - is up 5 kg, max UF goal w/ HD today.  MBD ckd - Ca in range, cont vdra tts.  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 Just had OP esa on  11/28. Follow, transfuse prn.   Ok to discharge from my perspective when GIB resolved.  If he goes today he should go to usual outpt HD tomorrow.   Jannifer Hick MD 04/14/2022, 8:14 AM  [facility] Pager: [phone]

## 2022-04-14 NOTE — Procedures (Signed)
HD Note:  Some information was entered later than the data was gathered due to patient care needs. The stated time with the data is accurate.  Received patient in bed to unit.  Alert and oriented.  Informed consent signed and in chart.   Patient tolerated well. He did have hypertension during treatment that was medicated with no result.  See MAR. He had cramps occassionally, but was able to move forward.   Transported back to the room  Alert, without acute distress.  Hand-off given to patient's nurse.   Access used: Right fistula Access issues: None  Total UF removed: 3400 ml   [facility]

## 2022-04-14 NOTE — Transfer of Care (Signed)
Immediate Anesthesia Transfer of Care Note  Patient: [name]  Procedure(s) Performed: FLEXIBLE SIGMOIDOSCOPY  Patient Location: PACU  Anesthesia Type:MAC  Level of Consciousness: drowsy  Airway & Oxygen Therapy: Patient Spontanous Breathing  Post-op Assessment: Report given to RN  Post vital signs: Reviewed and stable  Last Vitals:  Vitals Value Taken Time  BP 171/119 04/14/22 1056  Temp    Pulse 91 04/14/22 1057  Resp 19 04/14/22 1057  SpO2 94 % 04/14/22 1057  Vitals shown include unvalidated device data.  Last Pain:  Vitals:   04/14/22 0937  TempSrc: Oral  PainSc: 0-No pain      Patients Stated Pain Goal: 0 (22/44/97 5300)  Complications: No notable events documented.

## 2022-04-14 NOTE — Plan of Care (Signed)
  Problem: Health Behavior/Discharge Planning: Goal: Ability to manage health-related needs will improve Outcome: Progressing   Problem: Clinical Measurements: Goal: Ability to maintain clinical measurements within normal limits will improve Outcome: Progressing Goal: Will remain free from infection Outcome: Progressing   Problem: Nutrition: Goal: Adequate nutrition will be maintained Outcome: Progressing   Problem: Coping: Goal: Level of anxiety will decrease Outcome: Progressing   Problem: Elimination: Goal: Will not experience complications related to bowel motility Outcome: Progressing Goal: Will not experience complications related to urinary retention Outcome: Progressing

## 2022-04-14 NOTE — Anesthesia Postprocedure Evaluation (Signed)
Anesthesia Post Note  Patient: [name]  Procedure(s) Performed: [place]     Patient location during evaluation: PACU Anesthesia Type: MAC Level of consciousness: awake and alert Pain management: pain level controlled Vital Signs Assessment: post-procedure vital signs reviewed and stable Respiratory status: spontaneous breathing, nonlabored ventilation, respiratory function stable and patient connected to nasal cannula oxygen Cardiovascular status: stable and blood pressure returned to baseline Postop Assessment: no apparent nausea or vomiting Anesthetic complications: no Comments: Hypertensive in PACU receiving hydralazine.  Also scheduled for dialysis today once back to the floor (had missed Saturday session).   No notable events documented.  Last Vitals:  Vitals:   04/14/22 1055 04/14/22 1110  BP: (!) 168/110 (!) 191/115  Pulse: 89 86  Resp: 19 19  Temp: 36.8 C 36.8 C  SpO2: 94% 98%    Last Pain:  Vitals:   04/14/22 1055  TempSrc:   PainSc: [place]

## 2022-04-14 NOTE — Anesthesia Preprocedure Evaluation (Signed)
Anesthesia Evaluation  Patient identified by MRN, date of birth, ID band Patient awake    Reviewed: Allergy & Precautions, NPO status , Patient's Chart, lab work & pertinent test results, reviewed documented beta blocker date and time   Airway Mallampati: I  TM Distance: >3 FB Neck ROM: Full    Dental  (+) Teeth Intact, Dental Advisory Given   Pulmonary Current Smoker and Patient abstained from smoking.   Pulmonary exam normal breath sounds clear to auscultation       Cardiovascular hypertension, Pt. on medications and Pt. on home beta blockers Normal cardiovascular exam Rhythm:Regular Rate:Normal  Echo 03/02/22: 1. Left ventricular ejection fraction, by estimation, is 40 to 45%. The  left ventricle has mildly decreased function. The left ventricle  demonstrates global hypokinesis. There is mild left ventricular  hypertrophy. Left ventricular diastolic parameters  are indeterminate.   2. Right ventricular systolic function is normal. The right ventricular  size is normal. Tricuspid regurgitation signal is inadequate for assessing  PA pressure.   3. Small to moderate pericardial effusion. The pericardial effusion is  circumferential. There is no evidence of cardiac tamponade.   4. The mitral valve is abnormal. Mild mitral valve regurgitation. No  evidence of mitral stenosis.   5. The aortic valve is tricuspid. Aortic valve regurgitation is not  visualized. No aortic stenosis is present.   6. The inferior vena cava is normal in size with <50% respiratory  variability, suggesting right atrial pressure of 8 mmHg.     Neuro/Psych  PSYCHIATRIC DISORDERS  Depression Bipolar Disorder   negative neurological ROS     GI/Hepatic Neg liver ROS,GERD  Medicated,,Hematochezia and diarrhea   Endo/Other  negative endocrine ROS    Renal/GU Dialysis and ESRFRenal disease     Musculoskeletal negative musculoskeletal ROS (+)     Abdominal   Peds  Hematology  (+) Blood dyscrasia, anemia   Anesthesia Other Findings Day of surgery medications reviewed with the patient.  Reproductive/Obstetrics                             Anesthesia Physical Anesthesia Plan  ASA: 4  Anesthesia Plan: MAC   Post-op Pain Management: Minimal or no pain anticipated   Induction: Intravenous  PONV Risk Score and Plan: 0 and TIVA and Treatment may vary due to age or medical condition  Airway Management Planned:   Additional Equipment:   Intra-op Plan:   Post-operative Plan:   Informed Consent: I have reviewed the patients History and Physical, chart, labs and discussed the procedure including the risks, benefits and alternatives for the proposed anesthesia with the patient or authorized representative who has indicated his/her understanding and acceptance.     Dental advisory given  Plan Discussed with: CRNA  Anesthesia Plan Comments:        Anesthesia Quick Evaluation

## 2022-04-14 NOTE — Progress Notes (Signed)
Patient has been complaining since he was in HD regarding LAC (A-line Iv) in his arm, he also has been complaining of a severe headache.  Family members on his phone complaining as well.  Placed an iv team consult to assess this A line iv before attempting to remove it.    Patient also received Dilaudid 1 mg PO at 2141 for his severe headache.   Iv team came and removed A-line and placed a peripheral iv.  Patient is better.  Not complaining at this time.

## 2022-04-14 NOTE — Progress Notes (Addendum)
Daily Progress Note Intern Pager: [phone]  Patient name: [name] Medical record number: [id number] Date of birth: 1985/05/27 Age: 36 y.o. Gender: male  Primary Care Provider: [name], DO Consultants: Nephrology, GI Code Status: FULL  Pt Overview and Major Events to Date:  12/2: Transferred from [place], admitted by [facility] 12/3: FMTS assumed care of patient  Assessment and Plan: [name] is a 36 y.o. male with PMHx of Alport Syndrome, ESRD on HD, chronic HFrEF, and HTN presenting as transfer [facility] for missed dialysis session and hematochezia.   * Acute lower GI bleeding Intermittent rectal bleeding with hematochezia for the past few weeks. Hgb10.2>>9.2 after serial checks (baseline Hgb fluctuates greatly, seemingly ~10-12?). Seen by GI in the past and colonoscopy performed in June 2023 notable for diverticulosis of the sigmoid colon. Had a BM this AM without bleeding.  - Scheduled for flex sig today - Trend Hgb - Continue protonix  - Hold VTE ppx d/t bleeding, on SCDs - Consider obtaining ferritin and iron labs  ESRD (end stage renal disease) on dialysis ([place]) HD on T/Th/Sa. Missed on 12/2 due to GI bleeding.  - Nephrology consulted and recommended HD today as well as regularly scheduled HD tomorrow - Avoid nephrotoxic agents  - Renally dose medications as appropriate  - Continue calcitriol  Hypertensive urgency Likely to improve with dialysis as has been known to do in the past. Home meds include amlodipine 10 mg daily, coreg 25 mg twice daily and hydralazine 50 mg 3 times daily, Imdur 30 mg daily. - Continue home medications - prn labetalol until he receives HD - Monitor BP    FEN/GI: NPO for Flex Sig, Renal diet with fluid restriction s/p procedure PPx: None 2/2 acute GI bleed Dispo: Pending clinical improvement  Subjective:  Overnight the patient had a severe headache with sensitivity to light  with pain in the back of his head and over his whole face. He was treated for this with IV labetolol and one dose of dilaudid, which seemed to have helped improve his symptoms. He reports waking up today with a headache again, which subsided. He had a dark diarrheal BM this morning without blood. Denies any chest pain, SOB, N/V, or headache currently.   Objective: Temp:  [97.9 F (36.6 C)-98.6 F (37 C)] 98.2 F (36.8 C) (12/04 1110) Pulse Rate:  [72-89] 86 (12/04 1110) Resp:  [15-19] 16 (12/04 1145) BP: (152-209)/(89-115) 209/105 (12/04 1145) SpO2:  [94 %-100 %] 98 % (12/04 1110) Weight:  [77.1 kg] 77.1 kg (12/04 0937) Physical Exam: General: Awake, Alert male, sitting comfortably in chair in NAD Cardiovascular: RRR. 3/6 systolic murmur heard at LUSB Respiratory: CTAB. No wheezing, rales, or rhonchi appreciated. Normal WOB on RA. Abdomen: Soft, nontender, nondistended. Hyperactive bowel sounds. Extremities: No pitting edema. Neuro: A&Ox4  Laboratory: Most recent CBC Lab Results  Component Value Date   WBC 4.7 04/13/2022   HGB 9.2 (L) 04/14/2022   HCT 28.2 (L) 04/14/2022   MCV 90.1 04/13/2022   PLT 209 04/13/2022   Most recent BMP    Latest Ref Rng & Units 04/14/2022    4:03 AM  BMP  Glucose 70 - 99 mg/dL 89   BUN 6 - 20 mg/dL 114   Creatinine 0.61 - 1.24 mg/dL 22.38   Sodium 135 - 145 mmol/L 136   Potassium 3.5 - 5.1 mmol/L 5.2   Chloride 98 - 111 mmol/L 104   CO2 22 - 32  mmol/L 16   Calcium 8.9 - 10.3 mg/dL 8.6     Other pertinent labs - none  Imaging/Diagnostic Tests: No new imaging.  Britt Bolognese, Medical Student 04/14/2022, 11:08 AM  [place] Intern pager: [phone], text pages welcome Secure chat group [place]    I was personally present and re-performed the exam and medical decision making and verified the service and findings are accurately documented in the student's note. Alcus Dad,  MD 04/14/2022 1:51 PM

## 2022-04-14 NOTE — Interval H&P Note (Signed)
History and Physical Interval Note:  04/14/2022 10:24 AM  [name]  has presented today for surgery, with the diagnosis of Hematochezia and diarrhea.  The various methods of treatment have been discussed with the patient and family. After consideration of risks, benefits and other options for treatment, the patient has consented to  Procedure(s): FLEXIBLE SIGMOIDOSCOPY (N/A) as a surgical intervention.  The patient's history has been reviewed, patient examined, no change in status, stable for surgery.  I have reviewed the patient's chart and labs.  Questions were answered to the patient's satisfaction.     Jerral Mccauley

## 2022-04-14 NOTE — Op Note (Signed)
[facility] Patient Name: [name] Procedure Date : 04/14/2022 MRN: [id number] Attending MD: Mauri Pole , MD, [id number] Date of Birth: [birth year] CSN: [id number] Age: 36 Admit Type: Inpatient Procedure:                Flexible Sigmoidoscopy Indications:              Hematochezia Providers:                Mauri Pole, MD, Dulcy Fanny, Brien Mates, Technician Referring MD:              Medicines:                Monitored Anesthesia Care Complications:            No immediate complications. Estimated Blood Loss:     Estimated blood loss: none. Procedure:                Pre-Anesthesia Assessment:                           - Prior to the procedure, a History and Physical                            was performed, and patient medications and                            allergies were reviewed. The patient's tolerance of                            previous anesthesia was also reviewed. The risks                            and benefits of the procedure and the sedation                            options and risks were discussed with the patient.                            All questions were answered, and informed consent                            was obtained. Prior Anticoagulants: The patient has                            taken no anticoagulant or antiplatelet agents. ASA                            Grade Assessment: II - A patient with mild systemic                            disease. After reviewing the risks and benefits,  the patient was deemed in satisfactory condition to                            undergo the procedure.                           After obtaining informed consent, the scope was                            passed under direct vision. The PCF-HQ190L                            ([id number]) Olympus colonoscope was introduced                            through the anus and advanced  to the the left                            transverse colon. The flexible sigmoidoscopy was                            accomplished without difficulty. The patient                            tolerated the procedure well. The quality of the                            bowel preparation was fair. Scope In: 10:47:42 AM Scope Out: 10:50:37 AM Total Procedure Duration: 0 hours 2 minutes 55 seconds  Findings:      Hemorrhoids were found on perianal exam.      Non-bleeding external and internal hemorrhoids were found during       retroflexion. The hemorrhoids were small.      The exam was otherwise without abnormality. Impression:               - Preparation of the colon was fair.                           - Hemorrhoids found on perianal exam.                           - Non-bleeding external and internal hemorrhoids.                           - The examination was otherwise normal.                           - No specimens collected. Recommendation:           - Resume previous diet.                           - Colon biopsies from June 2023 were negative for                            IBD or microscopic coltiis, likely functional  diarrhea                           - Use Benefiber two teaspoons PO TID.                           - Use Analpram HC Cream 2.5%: Apply externally BID                            PRN for symptomatic hemorrhoids.                           - GI will sign off, available if have any questions Procedure Code(s):        --- Professional ---                           [phone], Sigmoidoscopy, flexible; diagnostic,                            including collection of specimen(s) by brushing or                            washing, when performed (separate procedure) Diagnosis Code(s):        --- Professional ---                           K64.8, Other hemorrhoids                           K92.1, Melena (includes Hematochezia) CPT copyright 2022 American  Medical Association. All rights reserved. The codes documented in this report are preliminary and upon coder review may  be revised to meet current compliance requirements. Mauri Pole, MD 04/14/2022 10:57:36 AM This report has been signed electronically. Number of Addenda: 0

## 2022-04-15 ENCOUNTER — Ambulatory Visit: Payer: Medicaid Other | Admitting: Cardiovascular Disease

## 2022-04-15 DIAGNOSIS — K922 Gastrointestinal hemorrhage, unspecified: Secondary | ICD-10-CM | POA: Diagnosis not present

## 2022-04-15 LAB — CBC
HCT: 27.8 % — ABNORMAL LOW (ref 39.0–52.0)
Hemoglobin: 9.5 g/dL — ABNORMAL LOW (ref 13.0–17.0)
MCH: 30.5 pg (ref 26.0–34.0)
MCHC: 34.2 g/dL (ref 30.0–36.0)
MCV: 89.4 fL (ref 80.0–100.0)
Platelets: 210 10*3/uL (ref 150–400)
RBC: 3.11 MIL/uL — ABNORMAL LOW (ref 4.22–5.81)
RDW: 15.9 % — ABNORMAL HIGH (ref 11.5–15.5)
WBC: 6.5 10*3/uL (ref 4.0–10.5)
nRBC: 0 % (ref 0.0–0.2)

## 2022-04-15 LAB — RENAL FUNCTION PANEL
Albumin: 3.1 g/dL — ABNORMAL LOW (ref 3.5–5.0)
Anion gap: 14 (ref 5–15)
BUN: 67 mg/dL — ABNORMAL HIGH (ref 6–20)
CO2: 23 mmol/L (ref 22–32)
Calcium: 8.4 mg/dL — ABNORMAL LOW (ref 8.9–10.3)
Chloride: 99 mmol/L (ref 98–111)
Creatinine, Ser: 15.56 mg/dL — ABNORMAL HIGH (ref 0.61–1.24)
GFR, Estimated: 4 mL/min — ABNORMAL LOW (ref >60)
Glucose, Bld: 80 mg/dL (ref 70–99)
Phosphorus: 8.6 mg/dL — ABNORMAL HIGH (ref 2.5–4.6)
Potassium: 4.1 mmol/L (ref 3.5–5.1)
Sodium: 136 mmol/L (ref 135–145)

## 2022-04-15 LAB — HEPATITIS B SURFACE ANTIBODY, QUANTITATIVE: Hep B S AB Quant (Post): 89.2 m[IU]/mL (ref >9.9)

## 2022-04-15 MED ORDER — PSYLLIUM 95 % PO PACK: 1.0000 | PACK | Freq: Three times a day (TID) | ORAL | Status: DC

## 2022-04-15 MED ORDER — TRIAMCINOLONE ACETONIDE 0.1 % EX OINT: TOPICAL_OINTMENT | CUTANEOUS | 0 refills | Status: DC

## 2022-04-15 MED ORDER — HYDRALAZINE HCL 50 MG PO TABS: 75.0000 mg | ORAL_TABLET | Freq: Three times a day (TID) | ORAL | Status: DC

## 2022-04-15 NOTE — Discharge Instructions (Addendum)
Dear [name],   Thank you for letting us participate in your care! In this section, you will find a brief hospital admission summary of why you were admitted to the hospital, what happened during your admission, your diagnosis/diagnoses, and recommended follow up.  Primary diagnosis: Rectal bleeding Treatment plan: GI performed a scope which did not show any bleeding but did show internal and external hemorrhoids.  You are advised to take a fiber supplement and use hemorrhoid cream. Secondary diagnosis: Hypertension Treatment plan: You had a dialysis session and restarted on her blood pressure medications.  You were discharged so that you could get to your cardiology appointment and additionally discuss changes in your blood pressure medications with them.  Your cardiology appointment is at 11 AM.   POST-HOSPITAL & CARE INSTRUCTIONS We recommend following up with your PCP within 1 week from being discharged from the hospital.  Your follow-up in our clinic is on 04/17/2022 at 8:30 AM Please let PCP/Specialists know of any changes in medications that were made which you will be able to see in the medications section of this packet.  DOCTOR'S APPOINTMENTS & FOLLOW UP Future Appointments  Date Time Provider [place]  04/15/2022 11:00 AM Nahser, Wonda Cheng, MD [facility]  04/17/2022  8:30 AM ACCESS TO CARE POOL [facility] [place]  05/01/2022  2:30 PM [name], DO [facility] [place]     Thank you for choosing [facility]! Take care and be well!  [facility]  [place], [place] [address] [phone]

## 2022-04-15 NOTE — Progress Notes (Signed)
[facility] Progress Note   Subjective:   Tolerated 3.4L UF with HD yesterday.  Flex sig hemorrhoids noted o/w ok - GI signed off.  Cont issues with HA yesterday - rec'd dilaudid IV -- he thinks the BP and HA were driven by a very painful IV in L arm that has subsequently been removed and he's feeling better.  Had a derm appt yesterday he missed due to hospitalization to evaluate a lesion on his scrotum  Objective Vitals:   04/14/22 2308 04/14/22 2358 04/15/22 0536 04/15/22 0839  BP: (!) 213/93 (!) 208/99 (!) 171/99 (!) 169/98  Pulse:  94 83 79  Resp:  19 17 18   Temp:  98.3 F (36.8 C) 98.4 F (36.9 C) 98.6 F (37 C)  TempSrc:  Oral    SpO2:  97% 97% 98%  Weight:      Height:       Physical Exam General: sitting in bed comfortable  Heart: RRR Lungs: clear Abdomen: soft Extremities: no edema Dialysis Access:  RUE megafistula +t/b GU: firm papule noted on R side of scrotum about 0.5cm - non erythematous and non fluctuant  Additional Objective Labs: Basic Metabolic Panel: Recent Labs  Lab 04/13/22 1924 04/14/22 0403 04/15/22 0408  NA 137 136 136  K 4.8 5.2* 4.1  CL 103 104 99  CO2 17* 16* 23  GLUCOSE 122* 89 80  BUN 110* 114* 67*  CREATININE 21.54* 22.38* 15.56*  CALCIUM 8.4* 8.6* 8.4*  PHOS 10.2*  --  8.6*    Liver Function Tests: Recent Labs  Lab 04/13/22 1924 04/15/22 0408  ALBUMIN 3.0* 3.1*    No results for input(s): "LIPASE", "AMYLASE" in the last 168 hours. CBC: Recent Labs  Lab 04/12/22 1351 04/13/22 0537 04/13/22 1924 04/13/22 2242 04/14/22 0403 04/15/22 0408  WBC 4.6  --  4.7  --   --  6.5  NEUTROABS 2.8  --   --   --   --   --   HGB 10.2*   < > 9.1* 9.0* 9.2* 9.5*  HCT 32.3*   < > 27.2* 27.8* 28.2* 27.8*  MCV 93.4  --  90.1  --   --  89.4  PLT 232  --  209  --   --  210   < > = values in this interval not displayed.    Blood Culture    Component Value Date/Time   SDES BLOOD RIGHT HAND 03/03/2022 0441   SPECREQUEST   03/03/2022 0441    BOTTLES DRAWN AEROBIC AND ANAEROBIC Blood Culture adequate volume   CULT  03/03/2022 0441    NO GROWTH 5 DAYS Performed at [facility], [place] [address], [place], [place] [address]    REPTSTATUS 03/08/2022 FINAL 03/03/2022 0441    Cardiac Enzymes: No results for input(s): "CKTOTAL", "CKMB", "CKMBINDEX", "TROPONINI" in the last 168 hours. CBG: Recent Labs  Lab 04/12/22 1318 04/12/22 1434 04/13/22 1845  GLUCAP 79 78 122*    Iron Studies: No results for input(s): "IRON", "TIBC", "TRANSFERRIN", "FERRITIN" in the last 72 hours. @lablastinr3 @ Studies/Results: No results found. Medications:  sodium chloride      amLODipine  10 mg Oral Daily   calcium acetate  667 mg Oral TID WC   carvedilol  25 mg Oral BID   Chlorhexidine Gluconate Cloth  6 each Topical Q0600   hydrALAZINE  50 mg Oral Q8H   hydrocortisone-pramoxine   Rectal BID   isosorbide mononitrate  30 mg Oral Daily  neomycin-polymyxin-hydrocortisone  3 drop Both EARS QID   pantoprazole  40 mg Oral Daily   polycarbophil  625 mg Oral Daily   psyllium  1 packet Oral TID   sodium chloride flush  3 mL Intravenous Q12H   sodium chloride flush  3 mL Intravenous Q12H    OP HD: [facility]    4h  400/1.5  72kg  2/2 bath  Heparin 1200  RUA AVF - last HD 11/28, post wt 73.7kg - calcitriol 1.75 ug po tiw - mircera 100 ug q2, last 11/28   Assessment/ Plan: Lower GI bleeding - passing blood w/ each BM. Hb 10.2 > now in 9s x 3 serialchecks. GI consulting. Has colonoscopy in June 2023 showed diverticulosis only, otherwise no acute findings. Flex sig 12/4 with hemorrhoids only.  Hb stable. 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 Missed HD Sat due to bleeding.  Had HD here Monday.  Due to high pt census unable to do another tx today -- d/w patient and he's going to see if he can do a make up at home unit tomorrow o/w will go Thurs.  Attention to fluid/K - he's aware.  HTN- cont home meds w/ prn IV meds. He thinks BP  was up overnight due to pain with the IV and feels much better now.  Maintain same meds and follow up at outpt unit re: bp.  Volume - looks fairly euvolemic , UF to EDW with HD.  MBD ckd - Ca in range, cont vdra tts.  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 Just had OP esa on 11/28. Follow, transfuse prn.  Scrotal lesion: missed derm appt yesterday - does not looks like an infection or calciphylaxis. Recommended he call to reschedule with derm.    Ok to discharge from my perspective. D/w primary team.   Jannifer Hick MD 04/15/2022, 9:06 AM  [facility] Pager: [phone]

## 2022-04-15 NOTE — Progress Notes (Incomplete)
Patient stated this morning that he has a very important appointment with his heart doctor  today and wanted to let the doctors here at [facility] be aware of that.

## 2022-04-15 NOTE — Progress Notes (Signed)
Patient given discharge instructions and verbalized understanding. PIV removed by RN and patient dressed himself. Waiting for his ride home.

## 2022-04-15 NOTE — Progress Notes (Signed)
Pt receives out-pt HD at [facility]. Contacted clinic and spoke to [place]. Clinic aware pt was d/c today and pt may be calling to ask about an appt for tomorrow.   Melven Sartorius Renal Navigator [phone]

## 2022-04-15 NOTE — Discharge Summary (Addendum)
[facility] Discharge Summary  Patient name: [name] record number: [id number] Date of birth: 19-Oct-1985 Age: 36 y.o. Gender: male Date of Admission: 04/12/2022  Date of Discharge: 04/15/22 Admitting Physician: Vianne Bulls, MD  Primary Care Provider: [name], DO Consultants: Nephrology, GI  Indication for Hospitalization: hematochezia  Discharge Diagnoses/Problem List:  Principal Problem for Admission: ESRD Other Problems addressed during stay:  Principal Problem:   Acute lower GI bleeding Active Problems:   ESRD (end stage renal disease) on dialysis ([place])   Heart failure with mildly reduced ejection fraction (HFmrEF) ([place])   Hypertensive urgency   Rectal bleeding  Brief Hospital Course:  [name] is a 36 y.o.male with a history of 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 His hospital course is detailed below:  Hematochezia Patient admitted for rectal bleeding x 2 days. Ongoing, intermittent issue for over 1 year previously worked up by GI in 10/2021 and found to be benign at that time. Hgb decreased to ~9 (baseline around 13). Underwent flex sig on 12/4 which showed internal and external hemorrhoids. Fiber supplement and hemorrhoid cream started prior to discharge.  ESRD Patient missed one dialysis session prior to admission due to rectal bleeding. Transferred from OSH for HD. Nephrology consulted and patient received make up dialysis session on 12/4 and was advised to continue regularly T/Th/Sa HD.  Discharged in stable condition with instructions to resume dialysis on Thursday.  Resistant HTN Patient hypertensive with climbing BP up to 230s/110s. Likely secondary to pain vs missed dialysis session. Managed with home  medications and as needed labetalol. Home hydralazine increase to 75mg  TID at discharge.  PCP follow up: Blood pressure management. Evaluation of leg cramps and spasms. Headache management.  Disposition: home  Discharge Condition: medically stable  Discharge Exam:  Vitals:   04/15/22 0536 04/15/22 0839  BP: (!) 171/99 (!) 169/98  Pulse: 83 79  Resp: 17 18  Temp: 98.4 F (36.9 C) 98.6 F (37 C)  SpO2: 97% 98%   General: NAD, sitting up in hospital bed Neuro: A&O Cardiovascular: RRR, no murmurs, no peripheral edema Respiratory: normal WOB on RA, CTAB, no wheezes, ronchi or rales Extremities: Moving all 4 extremities equally  Significant Procedures:  Hemodialysis 04/14/22, Flex sigmoidoscopy 12/4  Significant Labs and Imaging:  Recent Labs  Lab 04/13/22 1924 04/13/22 2242 04/14/22 0403 04/15/22 0408  WBC 4.7  --   --  6.5  HGB 9.1* 9.0* 9.2* 9.5*  HCT 27.2* 27.8* 28.2* 27.8*  PLT 209  --   --  210   Recent Labs  Lab 04/13/22 1924 04/14/22 0403 04/15/22 0408  NA 137 136 136  K 4.8 5.2* 4.1  CL 103 104 99  CO2 17* 16* 23  GLUCOSE 122* 89 80  BUN 110* 114* 67*  CREATININE 21.54* 22.38* 15.56*  CALCIUM 8.4* 8.6* 8.4*  PHOS 10.2*  --  8.6*  ALBUMIN 3.0*  --  3.1*   Pertinent Imaging  04/10/22 CXR IMPRESSION: Mild patchy opacity of the left lung base noted, not significantly changed.  Results/Tests Pending at Time of Discharge: none  Discharge Medications:  Allergies as of 04/15/2022       Reactions   Zestril [lisinopril] Swelling   Nsaids Other (See Comments)   "Kidney problems "   Risperdal [risperidone]  Other (See Comments)   Unknown reaction         Medication List     TAKE these medications    acetaminophen 325 MG tablet Commonly known as: TYLENOL Take 2 tablets (650 mg total) by mouth every 6 (six) hours as needed for mild pain (or Fever >/= 101). What changed:  medication strength how much to take reasons to take this   amLODipine  10 MG tablet Commonly known as: NORVASC TAKE 1 TABLET BY MOUTH EVERY DAY   calcitRIOL 0.25 MCG capsule Commonly known as: ROCALTROL Take 0.25 mcg by mouth daily.   calcium acetate 667 MG capsule Commonly known as: PHOSLO Take 1 capsule (667 mg total) by mouth 3 (three) times daily with meals.   carvedilol 25 MG tablet Commonly known as: Coreg Take 1 tablet (25 mg total) by mouth 2 (two) times daily.   hydrALAZINE 50 MG tablet Commonly known as: APRESOLINE Take 1 tablet (50 mg total) by mouth every 8 (eight) hours.   hydrocortisone-pramoxine 2.5-1 % rectal cream Commonly known as: ANALPRAM-HC Place rectally 2 (two) times daily.   isosorbide mononitrate 30 MG 24 hr tablet Commonly known as: IMDUR Take 1 tablet (30 mg total) by mouth daily.   neomycin-polymyxin-hydrocortisone OTIC solution Commonly known as: CORTISPORIN Place 3 drops into both ears 4 (four) times daily. For 7 days.   pantoprazole 40 MG tablet Commonly known as: PROTONIX Take 40 mg by mouth daily.   polycarbophil 625 MG tablet Commonly known as: FIBERCON Take 1 tablet (625 mg total) by mouth daily.   psyllium 95 % Pack Commonly known as: HYDROCIL/METAMUCIL Take 1 packet by mouth 3 (three) times daily.   triamcinolone 0.025 % ointment Commonly known as: KENALOG Apply 1 Application topically 2 (two) times daily. What changed: Another medication with the same name was added. Make sure you understand how and when to take each.   triamcinolone ointment 0.1 % Commonly known as: KENALOG Apply to scrotum twice daily for itching. Do not use for more than 2 weeks continuously What changed: You were already taking a medication with the same name, and this prescription was added. Make sure you understand how and when to take each.       Discharge Instructions: Please refer to Patient Instructions section of EMR for full details.  Patient was counseled important signs and symptoms that should prompt return to  medical care, changes in medications, dietary instructions, activity restrictions, and follow up appointments.   Follow-Up Appointments:  04/17/2022 [facility] 04/18/2022 Cardiology  Salvadore Oxford, MD 04/15/2022, 12:54 PM PGY-1, [place]  I was personally present and performed or re-performed the history, physical exam and medical decision making activities of this service and have verified that the service and findings are accurately documented in the intern's note.  [name], DO                  04/15/2022, 12:54 PM

## 2022-04-16 ENCOUNTER — Telehealth: Payer: Self-pay

## 2022-04-16 NOTE — Telephone Encounter (Signed)
Transition Care Management Unsuccessful Follow-up Telephone Call  Date of discharge and from where:  [facility] 04/15/2022  Attempts:  1st Attempt  Reason for unsuccessful TCM follow-up call:  Unable to leave message Juanda Crumble, [place] Direct Dial [phone]

## 2022-04-17 ENCOUNTER — Other Ambulatory Visit: Payer: Self-pay

## 2022-04-17 ENCOUNTER — Ambulatory Visit (INDEPENDENT_AMBULATORY_CARE_PROVIDER_SITE_OTHER): Payer: Medicare Other | Admitting: Student

## 2022-04-17 VITALS — BP 164/100 | HR 89 | Wt 171.8 lb

## 2022-04-17 DIAGNOSIS — I10 Essential (primary) hypertension: Secondary | ICD-10-CM | POA: Diagnosis present

## 2022-04-17 DIAGNOSIS — H60503 Unspecified acute noninfective otitis externa, bilateral: Secondary | ICD-10-CM

## 2022-04-17 DIAGNOSIS — K625 Hemorrhage of anus and rectum: Secondary | ICD-10-CM | POA: Diagnosis not present

## 2022-04-17 MED ORDER — CARVEDILOL 25 MG PO TABS: 25.0000 mg | ORAL_TABLET | Freq: Two times a day (BID) | ORAL | 0 refills | Status: DC

## 2022-04-17 MED ORDER — PSYLLIUM 95 % PO PACK: 1.0000 | PACK | Freq: Three times a day (TID) | ORAL | Status: DC

## 2022-04-17 MED ORDER — ISOSORBIDE MONONITRATE ER 30 MG PO TB24: 30.0000 mg | ORAL_TABLET | Freq: Every day | ORAL | 0 refills | Status: DC

## 2022-04-17 MED ORDER — HYDRALAZINE HCL 50 MG PO TABS: 75.0000 mg | ORAL_TABLET | Freq: Three times a day (TID) | ORAL | 3 refills | Status: DC

## 2022-04-17 MED ORDER — CALCIUM POLYCARBOPHIL 625 MG PO TABS: 625.0000 mg | ORAL_TABLET | Freq: Every day | ORAL | 0 refills | Status: DC

## 2022-04-17 NOTE — Assessment & Plan Note (Signed)
No further rectal bleeding since discharge. Encouraged to continue psyllium and fiber, and to regulate stool consistency. Return precautions discussed

## 2022-04-17 NOTE — Assessment & Plan Note (Signed)
Elevated today at 164/100. At discharge, hydralazine was increased to 75 mg 3 times daily.  I sent in prescription for this as well as his Coreg and Imdur, which doses were unchanged. Encouraged follow-up after dialysis for further blood pressure management.

## 2022-04-17 NOTE — Telephone Encounter (Signed)
Transition Care Management Unsuccessful Follow-up Telephone Call  Date of discharge and from where:  [facility] 04/15/2022  Attempts:  2nd Attempt  Reason for unsuccessful TCM follow-up call:  Left voice message Juanda Crumble, [place] Direct Dial [phone]

## 2022-04-17 NOTE — Patient Instructions (Signed)
It was great seeing you today.  I refilled your medicines. Please take them as prescribed and go to dialysis tomorrow as scheduled.   If you have any questions or concerns, please feel free to call the clinic.   Have a wonderful day,  Dr. [name] [facility] [phone]

## 2022-04-17 NOTE — Progress Notes (Signed)
    SUBJECTIVE:   CHIEF COMPLAINT / HPI:   [name] 36 year old male here for hospital follow-up after admission from 12/2-12/5 for rectal bleeding with decreasing hemoglobin, found to have internal and external hemorrhoids on 12/4 with flexible sigmoidoscopy.  Since discharge, has been feeling well.  No complaints other than his shoulder pain he has been having for a while.  He would like a letter to give to the orthopedic office given that he missed his appointment secondary to hospitalization.  He has had a few loose stools since discharge.  No further bleeding in stools or bright red blood per rectum. He tried to pick up the fiber supplement and cream, but it was too expensive.  Regarding his elevated blood pressure, he says he has not been taking his wife blood pressure is elevated today in addition to the pain from his shoulder.  He is scheduled for dialysis tomorrow 12/8, and also has a cardiology appointment tomorrow.  PERTINENT  PMH / PSH: Hypertension, ESRD Alport syndrome, hypertension, HFrEF  OBJECTIVE:   BP (!) 164/100   Pulse 89   Wt 171 lb 12.8 oz (77.9 kg)   SpO2 100%   BMI 26.12 kg/m   General: Well-appearing, no distress, ambulates unassisted CV: Regular rate and rhythm Respiratory: Normal work of breathing on room air, no wheezing or crackles Abdomen: Soft, nontender and nondistended.  Normal active bowel sounds. Skin: Warm, dry  ASSESSMENT/PLAN:   Hypertension Elevated today at 164/100. At discharge, hydralazine was increased to 75 mg 3 times daily.  I sent in prescription for this as well as his Coreg and Imdur, which doses were unchanged. Encouraged follow-up after dialysis for further blood pressure management.  Rectal bleeding No further rectal bleeding since discharge. Encouraged to continue psyllium and fiber, and to regulate stool consistency. Return precautions discussed   Provided note to excuse him from missing orthopedic  appointment.  [name], [name]

## 2022-04-18 ENCOUNTER — Ambulatory Visit: Payer: Medicare Other | Attending: Cardiovascular Disease | Admitting: Interventional Cardiology

## 2022-04-18 ENCOUNTER — Encounter: Payer: Self-pay | Admitting: Interventional Cardiology

## 2022-04-18 VITALS — BP 182/102 | HR 96 | Ht 68.0 in | Wt 173.2 lb

## 2022-04-18 DIAGNOSIS — I10 Essential (primary) hypertension: Secondary | ICD-10-CM | POA: Diagnosis not present

## 2022-04-18 DIAGNOSIS — N186 End stage renal disease: Secondary | ICD-10-CM | POA: Insufficient documentation

## 2022-04-18 DIAGNOSIS — R519 Headache, unspecified: Secondary | ICD-10-CM | POA: Diagnosis not present

## 2022-04-18 DIAGNOSIS — G8929 Other chronic pain: Secondary | ICD-10-CM | POA: Insufficient documentation

## 2022-04-18 NOTE — Progress Notes (Signed)
Cardiology Office Note   Date:  04/18/2022   ID:  [name], DOB 25-Aug-1985, MRN [id number]  PCP:  [name], DO    No chief complaint on file.  HTN, HFrEF  Wt Readings from Last 3 Encounters:  04/18/22 173 lb 3.2 oz (78.6 kg)  04/17/22 171 lb 12.8 oz (77.9 kg)  04/14/22 169 lb 15.6 oz (77.1 kg)       History of Present Illness: [name] is a 36 y.o. male who is being seen today for the evaluation of moderate heart failure at the request of Talbert Cage L, *.   Normal LVEF in 2019.   Echocardiogram from October 2023 shows: "Left ventricular ejection fraction, by estimation, is 40 to 45%. The  left ventricle has mildly decreased function. The left ventricle  demonstrates global hypokinesis. There is mild left ventricular  hypertrophy. Left ventricular diastolic parameters  are indeterminate.   2. Right ventricular systolic function is normal. The right ventricular  size is normal. Tricuspid regurgitation signal is inadequate for assessing  PA pressure.   3. Small to moderate pericardial effusion. The pericardial effusion is  circumferential. There is no evidence of cardiac tamponade.   4. The mitral valve is abnormal. Mild mitral valve regurgitation. No  evidence of mitral stenosis.   5. The aortic valve is tricuspid. Aortic valve regurgitation is not  visualized. No aortic stenosis is present.   6. The inferior vena cava is normal in size with <50% respiratory  variability, suggesting right atrial pressure of 8 mmHg."  Able to sleep flat.    Home BP readings in the 150-160s chronically.  Blood pressure can spike at home even to 170s 180s at times.  He has been of of his hydralazine for the past few days.    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   Hypertension    Low blood sugar    Renal disorder     Past Surgical History:  Procedure Laterality Date   APPENDECTOMY     AV FISTULA PLACEMENT Left 03/03/2019   Procedure: ARTERIOVENOUS (AV) FISTULA CREATION LEFT ARM;  Surgeon: Waynetta Sandy, MD;  Location: [facility];  Service: Vascular;  Laterality: Left;   [place] Right 04/12/2019   Procedure: RIGHT UPPER EXTREMITY [place];  Surgeon: Angelia Mould, MD;  Location: [place] [facility];  Service: Vascular;  Laterality: Right;   BIOPSY  10/14/2021   Procedure: BIOPSY;  Surgeon: Doran Stabler, MD;  Location: [facility];  Service: Gastroenterology;;   COLONOSCOPY WITH PROPOFOL N/A 10/14/2021   Procedure: COLONOSCOPY WITH PROPOFOL;  Surgeon: Doran Stabler, MD;  Location: [facility];  Service: Gastroenterology;  Laterality: N/A;   LIGATION OF ARTERIOVENOUS  FISTULA Left 03/06/2019   Procedure: LIGATION OF ARTERIOVENOUS  FISTULA;  Surgeon: Marty Heck, MD;  Location: [facility];  Service: Vascular;  Laterality: Left;   spinal tap     SPINE SURGERY     related to a spinal infection, unsure of surgery or infection source   WISDOM TOOTH EXTRACTION       Current Outpatient Medications  Medication Sig Dispense Refill  amLODipine (NORVASC) 10 MG tablet TAKE 1 TABLET BY MOUTH EVERY DAY 90 tablet 3   calcium acetate (PHOSLO) 667 MG capsule Take 1 capsule (667 mg total) by mouth 3 (three) times daily with meals. 100 capsule 0   hydrALAZINE (APRESOLINE) 50 MG tablet Take 1.5 tablets (75 mg total) by mouth 3 (three) times daily. 405 tablet 3   hydrocortisone-pramoxine (ANALPRAM-HC) 2.5-1 % rectal cream Place rectally 2 (two) times daily. 30 g 0   pantoprazole (PROTONIX) 40 MG tablet Take 40 mg by mouth daily.     psyllium (HYDROCIL/METAMUCIL) 95 % PACK Take 1 packet by mouth 3 (three) times daily. 240 each    triamcinolone  (KENALOG) 0.025 % ointment Apply 1 Application topically 2 (two) times daily. 30 g 0   triamcinolone ointment (KENALOG) 0.1 % Apply to scrotum twice daily for itching. Do not use for more than 2 weeks continuously 30 g 0   acetaminophen (TYLENOL) 325 MG tablet Take 2 tablets (650 mg total) by mouth every 6 (six) hours as needed for mild pain (or Fever >/= 101). (Patient not taking: Reported on 04/18/2022)     calcitRIOL (ROCALTROL) 0.25 MCG capsule Take 0.25 mcg by mouth daily. (Patient not taking: Reported on 03/24/2022)     carvedilol (COREG) 25 MG tablet Take 1 tablet (25 mg total) by mouth 2 (two) times daily. (Patient not taking: Reported on 04/18/2022) 60 tablet 0   isosorbide mononitrate (IMDUR) 30 MG 24 hr tablet Take 1 tablet (30 mg total) by mouth daily. (Patient not taking: Reported on 04/18/2022) 30 tablet 0   neomycin-polymyxin-hydrocortisone (CORTISPORIN) OTIC solution Place 3 drops into both ears 4 (four) times daily. For 7 days. (Patient not taking: Reported on 04/18/2022) 10 mL 0   polycarbophil (FIBERCON) 625 MG tablet Take 1 tablet (625 mg total) by mouth daily. (Patient not taking: Reported on 04/18/2022) 30 tablet 0   No current facility-administered medications for this visit.    Allergies:   Zestril [lisinopril], Nsaids, and Risperdal [risperidone]    Social History:  The patient  reports that he has been smoking cigarettes. He has been smoking an average of .3 packs per day. He has never used smokeless tobacco. He reports that he does not currently use alcohol. He reports current drug use. Drug: Marijuana.   Family History:  The patient's family history includes Asthma in his son; Diabetes in his mother; Hearing loss in his father; Heart disease in his maternal grandmother; Hypertension in his mother and sister; Kidney failure in his mother; Stroke in his maternal grandfather.    ROS:  Please see the history of present illness.   Otherwise, review of systems are positive for  headaches.   All other systems are reviewed and negative.    PHYSICAL EXAM: VS:  BP (!) 182/102   Pulse 96   Ht 5\' 8"  (1.727 m)   Wt 173 lb 3.2 oz (78.6 kg)   SpO2 98%   BMI 26.33 kg/m  , BMI Body mass index is 26.33 kg/m. GEN: Well nourished, well developed, in no acute distress HEENT: normal Neck: no JVD, carotid bruits, or masses Cardiac: RRR; no murmurs, rubs, or gallops,no edema  Respiratory:  clear to auscultation bilaterally, normal work of breathing GI: soft, nontender, nondistended, + BS MS: no deformity or atrophy Skin: warm and dry, no rash Neuro:  Strength and sensation are intact Psych: euthymic mood, full affect   EKG:   The ekg ordered today demonstrates NSR. LVH   Recent  Labs: 03/02/2022: TSH 1.169 03/04/2022: Magnesium 2.2 03/09/2022: ALT 40 04/12/2022: B Natriuretic Peptide 788.6 04/15/2022: BUN 67; Creatinine, Ser 15.56; Hemoglobin 9.5; Platelets 210; Potassium 4.1; Sodium 136   Lipid Panel No results found for: "CHOL", "TRIG", "HDL", "CHOLHDL", "VLDL", "LDLCALC", "LDLDIRECT"   Other studies Reviewed: Additional studies/ records that were reviewed today with results demonstrating: hospital records reviewed.   ASSESSMENT AND PLAN:  Decreased EF/Chronic HFrEF:  likely related to uncontrolled BP.  No angina.   Needs to restart hydralazine and Imdur.  Also reports some arm pain that he thinks is contributing to high BP today.  He will stop by the pharmacy to make sure that they have his prescriptions.  If they do not havem, we can call them in.  Follow up with PharmD HTN.   ESRD: Alports syndrome.  Tues, THur, Sat. Dialysis x 3 years. Headaches: May be related to BP.  First priority is get to get BP down. I think ischemia is unlikely to be cause for his global LV dysfunction. No angina.  COntinue medical therapy.    Current medicines are reviewed at length with the patient today.  The patient concerns regarding his medicines were addressed.  The  following changes have been made:  No change  Labs/ tests ordered today include:  No orders of the defined types were placed in this encounter.   Recommend 150 minutes/week of aerobic exercise Low fat, low carb, high fiber diet recommended  Disposition:   FU with Pharm D BP clinic   Signed, Larae Grooms, MD  04/18/2022 10:08 AM    [facility] [address], [place], [place]  [address] Phone: [phone]; Fax: [phone]

## 2022-04-18 NOTE — Patient Instructions (Signed)
Medication Instructions:  Your physician recommends that you continue on your current medications as directed. Please refer to the Current Medication list given to you today.  *If you need a refill on your cardiac medications before your next appointment, please call your pharmacy*  Lab Work: If you have labs (blood work) drawn today and your tests are completely normal, you will receive your results only by: [place] (if you have MyChart) OR A paper copy in the mail If you have any lab test that is abnormal or we need to change your treatment, we will call you to review the results.  Testing/Procedures: None ordered today.   Follow-Up: At [facility], you and your health needs are our priority.  As part of our continuing mission to provide you with exceptional heart care, we have created designated Provider Care Teams.  These Care Teams include your primary Cardiologist (physician) and Advanced Practice Providers (APPs -  Physician Assistants and Nurse Practitioners) who all work together to provide you with the care you need, when you need it.  We recommend signing up for the patient portal called "MyChart".  Sign up information is provided on this After Visit Summary.  MyChart is used to connect with patients for Virtual Visits (Telemedicine).  Patients are able to view lab/test results, encounter notes, upcoming appointments, etc.  Non-urgent messages can be sent to your provider as well.   To learn more about what you can do with MyChart, go to [email or web address].    Your next appointment:   2 to 3 weeks   The format for your next appointment:   In Person  Provider:   Pharmacy for Hypertension   Important Information About Sugar

## 2022-05-01 ENCOUNTER — Encounter: Payer: Self-pay | Admitting: Family Medicine

## 2022-05-01 ENCOUNTER — Ambulatory Visit (INDEPENDENT_AMBULATORY_CARE_PROVIDER_SITE_OTHER): Payer: Medicare Other | Admitting: Family Medicine

## 2022-05-01 VITALS — BP 159/102 | HR 94 | Temp 98.8°F | Ht 68.0 in | Wt 162.0 lb

## 2022-05-01 DIAGNOSIS — M25512 Pain in left shoulder: Secondary | ICD-10-CM

## 2022-05-01 DIAGNOSIS — M25511 Pain in right shoulder: Secondary | ICD-10-CM

## 2022-05-01 DIAGNOSIS — Z720 Tobacco use: Secondary | ICD-10-CM

## 2022-05-01 DIAGNOSIS — I10 Essential (primary) hypertension: Secondary | ICD-10-CM

## 2022-05-01 MED ORDER — NICOTINE POLACRILEX 4 MG MT GUM: 4.0000 mg | CHEWING_GUM | OROMUCOSAL | 0 refills | Status: DC | PRN

## 2022-05-01 NOTE — Assessment & Plan Note (Signed)
He is following with cardiology for management of his HTN and has an upcoming appointment next week. Did not make any adjustments today as patient had not taken all of his medications today.  He does not appear volume overloaded today and has been compliant with his dialysis.

## 2022-05-01 NOTE — Patient Instructions (Addendum)
It was wonderful to see you today.  Please bring ALL of your medications with you to every visit.   Today we talked about:  Your blood pressure is still elevated today.  I would not make any adjustments as you are seeing a cardiologist soon and you have not taken all of your medications today.  Continue to take your medications as prescribed and continue to log your numbers.  As we discussed, this will likely take a combination of both adjustments in your medications and dialysis.  [facility] Address: [address], [place], [place] [address] Phone: [phone]   I sent in Nicotine gum for you to take as needed. Continue your efforts to cut back!!  Thank you for coming to your visit as scheduled. We have had a large "no-show" problem lately, and this significantly limits our ability to see and care for patients. As a friendly reminder- if you cannot make your appointment please call to cancel. We do have a no show policy for those who do not cancel within 24 hours. Our policy is that if you miss or fail to cancel an appointment within 24 hours, 3 times in a 47-month period, you may be dismissed from our clinic.   Thank you for choosing [facility].   Please call [phone] with any questions about today's appointment.  Please be sure to schedule follow up at the front  desk before you leave today.   [name], DO PGY-3 Family Medicine

## 2022-05-01 NOTE — Progress Notes (Signed)
    SUBJECTIVE:   CHIEF COMPLAINT / HPI:   [name] is a 36 y.o. male who presents to the [facility] clinic today to discuss the following concerns:   Hypertension F/U Last seen in our office on 12/7.  His blood pressure is elevated 164/100 at that time. The following day he was seen by cardiology.  His blood pressure remained elevated, noted to be 182/102.  There were no medication adjustments made at that time as he had not yet started on his hydralazine or Imdur. Current medications include hydralazine 75 mg 3 times daily, Coreg 25 mg, Imdur 30 mg, Amlodipine 10 mg. Reports good compliance, states that he has been taking his medications "faithfully" but today he has only taken the Hydralazine and Pantoprazole- he plans to take the remaining when he gets back home.  He brings with him his home blood pressure log. He has been checking 1-2 times a day, all readings are high. They range 644-034 systolic and 74-259 diastolic. He has been keeping a log as directed by the cardiology team. He is scheduled to see them again 12/29 and 1/26.   PERTINENT  PMH / PSH: Hypertension, ESRD [place] syndrome, hypertension, HFrEF, internal/external hemorrhoids   OBJECTIVE:   Vitals:   05/01/22 1445  BP: (!) 159/102  Pulse: 94  Temp: 98.8 F (37.1 C)  SpO2: 97%   General: NAD, pleasant, able to participate in exam, hard of hearing 2/2 Al[place] syndrome Cardiac: RRR, no murmurs. Respiratory: CTAB, normal effort, No wheezes, rales or rhonchi Psych: Normal affect and mood  ASSESSMENT/PLAN:   Hypertension He is following with cardiology for management of his HTN and has an upcoming appointment next week. Did not make any adjustments today as patient had not taken all of his medications today.  He does not appear volume overloaded today and has been compliant with his dialysis.  Tobacco use Working on cutting back. He is smoking 1-2 black and milds daily. He would like to stop completely, was open to  options. Rx nicotine gum. Discussed how to use.  Bilateral shoulder pain He was previously seen for bilateral shoulder pain on 11/28. Examination was notable for subcutaneous nodules.  He received a referral to sports medicine for potential ultrasound of his shoulders.  He reports that he has not yet heard from them, he was given contact information to call today.   [name], [place]

## 2022-05-01 NOTE — Assessment & Plan Note (Signed)
Working on cutting back. He is smoking 1-2 black and milds daily. He would like to stop completely, was open to options. Rx nicotine gum. Discussed how to use.

## 2022-05-01 NOTE — Assessment & Plan Note (Addendum)
He was previously seen for bilateral shoulder pain on 11/28. Examination was notable for subcutaneous nodules.  He received a referral to sports medicine for potential ultrasound of his shoulders.  He reports that he has not yet heard from them, he was given contact information to call today.

## 2022-05-08 NOTE — Progress Notes (Signed)
Office Visit    Patient Name: [name] Date of Encounter: 05/09/2022  Primary Care Provider:  [name], DO Primary Cardiologist:  Larae Grooms, MD Primary Electrophysiologist: None  Chief Complaint    [name] is a 36 y.o. male with PMH of ESRD on HD (Tues,Thurs, Sat), HTN, HFrEF, HTN, GERD, Bipolar and congential hearing loss who presents today for follow-up of hypertension.  Past Medical History    Past Medical History:  Diagnosis Date   Bipolar 1 disorder ([place])    CKD (chronic kidney disease)    on dialysis   Depression    GERD (gastroesophageal reflux disease)    Hearing difficulty of left ear    75% hearing   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   Hypertension    Low blood sugar    Renal disorder    Past Surgical History:  Procedure Laterality Date   APPENDECTOMY     AV FISTULA PLACEMENT Left 03/03/2019   Procedure: ARTERIOVENOUS (AV) FISTULA CREATION LEFT ARM;  Surgeon: Waynetta Sandy, MD;  Location: [facility];  Service: Vascular;  Laterality: Left;   [place] Right 04/12/2019   Procedure: RIGHT UPPER EXTREMITY [place];  Surgeon: Angelia Mould, MD;  Location: [facility] [facility];  Service: Vascular;  Laterality: Right;   BIOPSY  10/14/2021   Procedure: BIOPSY;  Surgeon: Doran Stabler, MD;  Location: [facility];  Service: Gastroenterology;;   COLONOSCOPY WITH PROPOFOL N/A 10/14/2021   Procedure: COLONOSCOPY WITH PROPOFOL;  Surgeon: Doran Stabler, MD;  Location: [facility];  Service: Gastroenterology;  Laterality: N/A;   FLEXIBLE SIGMOIDOSCOPY N/A 04/14/2022   Procedure: FLEXIBLE SIGMOIDOSCOPY;  Surgeon: Mauri Pole, MD;  Location: [facility];  Service: Gastroenterology;  Laterality: N/A;   LIGATION OF ARTERIOVENOUS  FISTULA Left 03/06/2019   Procedure: LIGATION OF ARTERIOVENOUS  FISTULA;  Surgeon: Marty Heck, MD;  Location: [facility];   Service: Vascular;  Laterality: Left;   spinal tap     SPINE SURGERY     related to a spinal infection, unsure of surgery or infection source   WISDOM TOOTH EXTRACTION      Allergies  Allergies  Allergen Reactions   Zestril [Lisinopril] Swelling   Nsaids Other (See Comments)    "Kidney problems "   Risperdal [Risperidone] Other (See Comments)    Unknown reaction     History of Present Illness    Nycere L [name]  is a 36 year old male with the above mention past medical history who presents today for follow-up of hypertension.  He was initially seen by consultation on 03/02/2022 for complaint of chest pain.  His initial blood pressure in the ED was 171/112 and troponin's elevated at 85>> 163>> 269>> 560>>515.  EKG revealed sinus tachycardia and chest x-ray was suggestive of pneumonia.  2D echo was completed showing EF of 40-45% with global hypokinesis, normal RV function, small to moderate pericardial effusion present with no evidence of tamponade and mild MR.  Troponin elevation was suspected to be related to uncontrolled hypertension.  He was advised to continue Coreg 6.25 mg twice daily and hydralazine 25 mg 3 times daily.  He was seen by Dr. Irish Lack on 04/18/2022 for complaint of hypertension and HFrEF.  He reported chronically elevated blood pressures spike to the 539J to 673A systolically.  He reported being off of his hydralazine for past couple of days.  He was encouraged  to restart hydralazine and Imdur and follow-up with Pharm.D. for management of hypertension.  Mr. [name] presents today for 2-week follow-up of hypertension alone.  Since last being seen in the office patient reports that he has been taking his blood pressure medications as directed.  His blood pressure today was 174/110 and he reports that he forgot to take his medicine while rushing to the clinic today.  Blood pressure on recheck was 162/108.  He denied any chest discomfort today but does report on Tuesday during  dialysis he fell asleep during his session and when he awoke he had chest pain and required oxygen for the rest of the session.  He reports that he is currently going through a separation with his wife and this is a great source of stress for him.  He notes occasional discomfort and anxiety when thinking about his situation.  He is currently working as a maintenance man does complain of occasional bouts of shortness of breath with his work.  Patient denies chest pain, palpitations, dyspnea, PND, orthopnea, nausea, vomiting, dizziness, syncope, edema, weight gain, or early satiety.  Home Medications    Current Outpatient Medications  Medication Sig Dispense Refill   acetaminophen (TYLENOL) 325 MG tablet Take 2 tablets (650 mg total) by mouth every 6 (six) hours as needed for mild pain (or Fever >/= 101).     amLODipine (NORVASC) 10 MG tablet TAKE 1 TABLET BY MOUTH EVERY DAY 90 tablet 3   calcitRIOL (ROCALTROL) 0.25 MCG capsule Take 0.25 mcg by mouth daily.     calcium acetate (PHOSLO) 667 MG capsule Take 1 capsule (667 mg total) by mouth 3 (three) times daily with meals. 100 capsule 0   carvedilol (COREG) 25 MG tablet Take 1 tablet (25 mg total) by mouth 2 (two) times daily. 60 tablet 0   hydrALAZINE (APRESOLINE) 100 MG tablet Take 1 tablet (100 mg total) by mouth 3 (three) times daily. 270 tablet 1   hydrocortisone-pramoxine (ANALPRAM-HC) 2.5-1 % rectal cream Place rectally 2 (two) times daily. 30 g 0   isosorbide mononitrate (IMDUR) 30 MG 24 hr tablet Take 1 tablet (30 mg total) by mouth daily. 30 tablet 0   neomycin-polymyxin-hydrocortisone (CORTISPORIN) OTIC solution Place 3 drops into both ears 4 (four) times daily. For 7 days. 10 mL 0   pantoprazole (PROTONIX) 40 MG tablet Take 40 mg by mouth daily.     polycarbophil (FIBERCON) 625 MG tablet Take 1 tablet (625 mg total) by mouth daily. 30 tablet 0   psyllium (HYDROCIL/METAMUCIL) 95 % PACK Take 1 packet by mouth 3 (three) times daily. 240 each     triamcinolone (KENALOG) 0.025 % ointment Apply 1 Application topically 2 (two) times daily. 30 g 0   triamcinolone ointment (KENALOG) 0.1 % Apply to scrotum twice daily for itching. Do not use for more than 2 weeks continuously 30 g 0   nicotine polacrilex (NICORETTE) 4 MG gum Take 1 each (4 mg total) by mouth as needed for smoking cessation. (Patient not taking: Reported on 05/09/2022) 100 tablet 0   No current facility-administered medications for this visit.     Review of Systems  Please see the history of present illness.    (+) Chest discomfort, anxiety (+) Shortness of breath with exertion  All other systems reviewed and are otherwise negative except as noted above.  Physical Exam    Wt Readings from Last 3 Encounters:  05/09/22 173 lb 3.2 oz (78.6 kg)  05/01/22 162 lb (73.5 kg)  04/18/22 173 lb 3.2 oz (78.6 kg)   VS: Vitals:   05/09/22 1126 05/09/22 1210  BP: (!) 174/110 (!) 162/108  Pulse: 77   SpO2: 98%   ,Body mass index is 26.33 kg/m.  Constitutional:      Appearance: Healthy appearance. Not in distress.  Neck:     Vascular: JVD normal.  Pulmonary:     Effort: Pulmonary effort is normal.     Breath sounds: No wheezing. No rales. Diminished in the bases Cardiovascular:     Normal rate. Regular rhythm. Normal S1. Normal S2.      Murmurs: There is no murmur.  Patient has right upper arm graft in place with bruit and thrill present Edema:    Peripheral edema absent.  Abdominal:     Palpations: Abdomen is soft non tender. There is no hepatomegaly.  Skin:    General: Skin is warm and dry.  Neurological:     General: No focal deficit present.     Mental Status: Alert and oriented to person, place and time.     Cranial Nerves: Cranial nerves are intact.  EKG/LABS/Other Studies Reviewed    ECG personally reviewed by me today -none completed today   Lab Results  Component Value Date   WBC 6.5 04/15/2022   HGB 9.5 (L) 04/15/2022   HCT 27.8 (L) 04/15/2022    MCV 89.4 04/15/2022   PLT 210 04/15/2022   Lab Results  Component Value Date   CREATININE 15.56 (H) 04/15/2022   BUN 67 (H) 04/15/2022   NA 136 04/15/2022   K 4.1 04/15/2022   CL 99 04/15/2022   CO2 23 04/15/2022   Lab Results  Component Value Date   ALT 40 03/09/2022   AST 36 03/09/2022   ALKPHOS 77 03/09/2022   BILITOT 0.5 03/09/2022   No results found for: "CHOL", "HDL", "LDLCALC", "LDLDIRECT", "TRIG", "CHOLHDL"  Lab Results  Component Value Date   HGBA1C 5.1 02/11/2021    Assessment & Plan    1.  Hypertension: -HYPERTENSION CONTROL Vitals:   05/09/22 1126 05/09/22 1210  BP: (!) 174/110 (!) 162/108    The patient's blood pressure is elevated above target today.  In order to address the patient's elevated BP: A current anti-hypertensive medication was adjusted today.; A referral to the [facility] will be placed.      -We will change hydralazine to 100 mg 3 times daily, Coreg 25 mg twice daily, Imdur 30 mg daily and amlodipine 10 mg daily -He has a follow-up with Pharm.D. in 3 weeks for blood pressure titration  2.  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 -Low sodium diet, fluid restriction <2L, and daily weights encouraged. Educated to contact our office for weight gain of 2 lbs overnight or 5 lbs in one week.   3.  ESRD: -Secondary to [place] syndrome -Patient dialyzed on Tuesday, Thursday, Saturday  4.  Tobacco abuse: Smoking cessation was advised and patient is planning to start nicotine gum after New Year's.  5.  Chest pain: -Patient reports chest discomfort during dialysis session on Tuesday and has experienced discomfort with increased stressful events. -Continue Imdur 30 mg daily -Discussed pursuing PET/CT for evaluation of possible ischemia if chest pain becomes regular  and changes in intensity. -He was advised to seek care in the ED if chest pain becomes  increased and not relieved with nitroglycerin.     Disposition: Follow-up with Larae Grooms, MD or APP in 1 months    Medication Adjustments/Labs and Tests Ordered: Current medicines are reviewed at length with the patient today.  Concerns regarding medicines are outlined above.   Signed, Mable Fill, Marissa Nestle, NP 05/09/2022, 12:13 PM [facility]  Note:  This document was prepared using Dragon voice recognition software and may include unintentional dictation errors.

## 2022-05-09 ENCOUNTER — Ambulatory Visit: Payer: Medicare Other | Attending: Nurse Practitioner | Admitting: Nurse Practitioner

## 2022-05-09 ENCOUNTER — Encounter: Payer: Self-pay | Admitting: Nurse Practitioner

## 2022-05-09 VITALS — BP 162/108 | HR 77 | Ht 68.0 in | Wt 173.2 lb

## 2022-05-09 DIAGNOSIS — I5022 Chronic systolic (congestive) heart failure: Secondary | ICD-10-CM | POA: Diagnosis not present

## 2022-05-09 DIAGNOSIS — R079 Chest pain, unspecified: Secondary | ICD-10-CM | POA: Diagnosis present

## 2022-05-09 DIAGNOSIS — N186 End stage renal disease: Secondary | ICD-10-CM | POA: Diagnosis not present

## 2022-05-09 DIAGNOSIS — I10 Essential (primary) hypertension: Secondary | ICD-10-CM

## 2022-05-09 DIAGNOSIS — Z720 Tobacco use: Secondary | ICD-10-CM

## 2022-05-09 MED ORDER — HYDRALAZINE HCL 100 MG PO TABS: 100.0000 mg | ORAL_TABLET | Freq: Three times a day (TID) | ORAL | 1 refills | Status: DC

## 2022-05-09 NOTE — Patient Instructions (Signed)
Medication Instructions:  Your physician has recommended you make the following change in your medication:   INCREASE the Hydralazine to 100 mg taking 1 tablet three times a day   *If you need a refill on your cardiac medications before your next appointment, please call your pharmacy*   Lab Work: None ordered  If you have labs (blood work) drawn today and your tests are completely normal, you will receive your results only by: [place] (if you have MyChart) OR A paper copy in the mail If you have any lab test that is abnormal or we need to change your treatment, we will call you to review the results.   Testing/Procedures: None ordered   Follow-Up: At [facility], you and your health needs are our priority.  As part of our continuing mission to provide you with exceptional heart care, we have created designated Provider Care Teams.  These Care Teams include your primary Cardiologist (physician) and Advanced Practice Providers (APPs -  Physician Assistants and Nurse Practitioners) who all work together to provide you with the care you need, when you need it.  We recommend signing up for the patient portal called "MyChart".  Sign up information is provided on this After Visit Summary.  MyChart is used to connect with patients for Virtual Visits (Telemedicine).  Patients are able to view lab/test results, encounter notes, upcoming appointments, etc.  Non-urgent messages can be sent to your provider as well.   To learn more about what you can do with MyChart, go to [email or web address].    Your next appointment:   Week of 06/20/21    The format for your next appointment:   In Person  Provider:   [name], NP         Other Instructions Daily Weight Record It is important to weigh yourself daily. To do this: Make sure you use a reliable scale. Use the same scale each day. Keep this daily weight chart near your scale. Weigh yourself each morning at the same time  after you use the bathroom. Before weighing yourself: Take off your shoes. Make sure you are wearing the same amount of clothing each day. Write down your weight in the spaces on the form. Compare today's weight to yesterday's weight. Bring this form with you to your follow-up visits with your health care provider. Call your health care provider if you have concerns about your weight, including rapid weight gain or loss. Date: ________ Weight: ____________________ Date: ________ Weight: ____________________ Date: ________ Weight: ____________________ Date: ________ Weight: ____________________ Date: ________ Weight: ____________________ Date: ________ Weight: ____________________ Date: ________ Weight: ____________________ Date: ________ Weight: ____________________ Date: ________ Weight: ____________________ Date: ________ Weight: ____________________ Date: ________ Weight: ____________________ Date: ________ Weight: ____________________ Date: ________ Weight: ____________________ Date: ________ Weight: ____________________ Date: ________ Weight: ____________________ Date: ________ Weight: ____________________ Date: ________ Weight: ____________________ Date: ________ Weight: ____________________ Date: ________ Weight: ____________________ Date: ________ Weight: ____________________ Date: ________ Weight: ____________________ Date: ________ Weight: ____________________ Date: ________ Weight: ____________________ Date: ________ Weight: ____________________ Date: ________ Weight: ____________________ Date: ________ Weight: ____________________ Date: ________ Weight: ____________________ Date: ________ Weight: ____________________ Date: ________ Weight: ____________________ Date: ________ Weight: ____________________ Date: ________ Weight: ____________________ Date: ________ Weight: ____________________ Date: ________ Weight: ____________________ Date: ________ Weight:  ____________________ Date: ________ Weight: ____________________ Date: ________ Weight: ____________________ Date: ________ Weight: ____________________ Date: ________ Weight: ____________________ Date: ________ Weight: ____________________ Date: ________ Weight: ____________________ Date: ________ Weight: ____________________ Date: ________ Weight: ____________________ Date: ________ Weight: ____________________ Date: ________ Weight: ____________________ Date: ________  Weight: ____________________ Date: ________ Weight: ____________________ Date: ________ Weight: ____________________ Date: ________ Weight: ____________________ Date: ________ Weight: ____________________ Date: ________ Weight: ____________________ This information is not intended to replace advice given to you by your health care provider. Make sure you discuss any questions you have with your health care provider. Document Revised: 01/01/2021 Document Reviewed: 01/01/2021 Elsevier Patient Education  [place] Eating Plan DASH stands for Dietary Approaches to Stop Hypertension. The DASH eating plan is a healthy eating plan that has been shown to: Reduce high blood pressure (hypertension). Reduce your risk for type 2 diabetes, heart disease, and stroke. Help with weight loss. What are tips for following this plan? Reading food labels Check food labels for the amount of salt (sodium) per serving. Choose foods with less than 5 percent of the Daily Value of sodium. Generally, foods with less than 300 milligrams (mg) of sodium per serving fit into this eating plan. To find whole grains, look for the word "whole" as the first word in the ingredient list. Shopping Buy products labeled as "low-sodium" or "no salt added." Buy fresh foods. Avoid canned foods and pre-made or frozen meals. Cooking Avoid adding salt when cooking. Use salt-free seasonings or herbs instead of table salt or sea salt. Check with your health  care provider or pharmacist before using salt substitutes. Do not fry foods. Cook foods using healthy methods such as baking, boiling, grilling, roasting, and broiling instead. Cook with heart-healthy oils, such as olive, canola, avocado, soybean, or sunflower oil. Meal planning  Eat a balanced diet that includes: 4 or more servings of fruits and 4 or more servings of vegetables each day. Try to fill one-half of your plate with fruits and vegetables. 6-8 servings of whole grains each day. Less than 6 oz (170 g) of lean meat, poultry, or fish each day. A 3-oz (85-g) serving of meat is about the same size as a deck of cards. One egg equals 1 oz (28 g). 2-3 servings of low-fat dairy each day. One serving is 1 cup (237 mL). 1 serving of nuts, seeds, or beans 5 times each week. 2-3 servings of heart-healthy fats. Healthy fats called omega-3 fatty acids are found in foods such as walnuts, flaxseeds, fortified milks, and eggs. These fats are also found in cold-water fish, such as sardines, salmon, and mackerel. Limit how much you eat of: Canned or prepackaged foods. Food that is high in trans fat, such as some fried foods. Food that is high in saturated fat, such as fatty meat. Desserts and other sweets, sugary drinks, and other foods with added sugar. Full-fat dairy products. Do not salt foods before eating. Do not eat more than 4 egg yolks a week. Try to eat at least 2 vegetarian meals a week. Eat more home-cooked food and less restaurant, buffet, and fast food. Lifestyle When eating at a restaurant, ask that your food be prepared with less salt or no salt, if possible. If you drink alcohol: Limit how much you use to: 0-1 drink a day for women who are not pregnant. 0-2 drinks a day for men. Be aware of how much alcohol is in your drink. In the [place] one drink equals one 12 oz bottle of beer (355 mL), one 5 oz glass of wine (148 mL), or one 1 oz glass of hard liquor (44 mL). General  information Avoid eating more than 2,300 mg of salt a day. If you have hypertension, you may need to reduce your sodium intake  to 1,500 mg a day. Work with your health care provider to maintain a healthy body weight or to lose weight. Ask what an ideal weight is for you. Get at least 30 minutes of exercise that causes your heart to beat faster (aerobic exercise) most days of the week. Activities may include walking, swimming, or biking. Work with your health care provider or dietitian to adjust your eating plan to your individual calorie needs. What foods should I eat? Fruits All fresh, dried, or frozen fruit. Canned fruit in natural juice (without added sugar). Vegetables Fresh or frozen vegetables (raw, steamed, roasted, or grilled). Low-sodium or reduced-sodium tomato and vegetable juice. Low-sodium or reduced-sodium tomato sauce and tomato paste. Low-sodium or reduced-sodium canned vegetables. Grains Whole-grain or whole-wheat bread. Whole-grain or whole-wheat pasta. Brown rice. [place]. Bulgur. Whole-grain and low-sodium cereals. Pita bread. Low-fat, low-sodium crackers. Whole-wheat flour tortillas. Meats and other proteins Skinless chicken or [place]. Ground chicken or [place]. Pork with fat trimmed off. Fish and seafood. Egg whites. Dried beans, peas, or lentils. Unsalted nuts, nut butters, and seeds. Unsalted canned beans. Lean cuts of beef with fat trimmed off. Low-sodium, lean precooked or cured meat, such as sausages or meat loaves. Dairy Low-fat (1%) or fat-free (skim) milk. Reduced-fat, low-fat, or fat-free cheeses. Nonfat, low-sodium ricotta or cottage cheese. Low-fat or nonfat yogurt. Low-fat, low-sodium cheese. Fats and oils Soft margarine without trans fats. Vegetable oil. Reduced-fat, low-fat, or light mayonnaise and salad dressings (reduced-sodium). Canola, safflower, olive, avocado, soybean, and sunflower oils. Avocado. Seasonings and condiments Herbs. Spices. Seasoning  mixes without salt. Other foods Unsalted popcorn and pretzels. Fat-free sweets. The items listed above may not be a complete list of foods and beverages you can eat. Contact a dietitian for more information. What foods should I avoid? Fruits Canned fruit in a light or heavy syrup. Fried fruit. Fruit in cream or butter sauce. Vegetables Creamed or fried vegetables. Vegetables in a cheese sauce. Regular canned vegetables (not low-sodium or reduced-sodium). Regular canned tomato sauce and paste (not low-sodium or reduced-sodium). Regular tomato and vegetable juice (not low-sodium or reduced-sodium). Angie Fava. Olives. Grains Baked goods made with fat, such as croissants, muffins, or some breads. Dry pasta or rice meal packs. Meats and other proteins Fatty cuts of meat. Ribs. Fried meat. Berniece Salines. Bologna, salami, and other precooked or cured meats, such as sausages or meat loaves. Fat from the back of a pig (fatback). Bratwurst. Salted nuts and seeds. Canned beans with added salt. Canned or smoked fish. Whole eggs or egg yolks. Chicken or [place] with skin. Dairy Whole or 2% milk, cream, and half-and-half. Whole or full-fat cream cheese. Whole-fat or sweetened yogurt. Full-fat cheese. Nondairy creamers. Whipped toppings. Processed cheese and cheese spreads. Fats and oils Butter. Stick margarine. Lard. Shortening. Ghee. Bacon fat. Tropical oils, such as coconut, palm kernel, or palm oil. Seasonings and condiments Onion salt, garlic salt, seasoned salt, table salt, and sea salt. Worcestershire sauce. Tartar sauce. Barbecue sauce. Teriyaki sauce. Soy sauce, including reduced-sodium. Steak sauce. Canned and packaged gravies. Fish sauce. Oyster sauce. Cocktail sauce. Store-bought horseradish. Ketchup. Mustard. Meat flavorings and tenderizers. Bouillon cubes. Hot sauces. Pre-made or packaged marinades. Pre-made or packaged taco seasonings. Relishes. Regular salad dressings. Other foods Salted popcorn and  pretzels. The items listed above may not be a complete list of foods and beverages you should avoid. Contact a dietitian for more information. Where to find more information National Heart, Lung, and Blood Institute: https://wilson-eaton.com/ American Heart Association: www.heart.org Academy of Nutrition and  Dietetics: www.eatright.[place]: www.kidney.org Summary The DASH eating plan is a healthy eating plan that has been shown to reduce high blood pressure (hypertension). It may also reduce your risk for type 2 diabetes, heart disease, and stroke. When on the DASH eating plan, aim to eat more fresh fruits and vegetables, whole grains, lean proteins, low-fat dairy, and heart-healthy fats. With the DASH eating plan, you should limit salt (sodium) intake to 2,300 mg a day. If you have hypertension, you may need to reduce your sodium intake to 1,500 mg a day. Work with your health care provider or dietitian to adjust your eating plan to your individual calorie needs. This information is not intended to replace advice given to you by your health care provider. Make sure you discuss any questions you have with your health care provider. Document Revised: 04/01/2019 Document Reviewed: 04/01/2019 Elsevier Patient Education  [place]

## 2022-05-10 ENCOUNTER — Other Ambulatory Visit: Payer: Self-pay | Admitting: Student

## 2022-05-14 ENCOUNTER — Ambulatory Visit (INDEPENDENT_AMBULATORY_CARE_PROVIDER_SITE_OTHER): Payer: Medicare Other | Admitting: Sports Medicine

## 2022-05-14 ENCOUNTER — Ambulatory Visit
Admission: RE | Admit: 2022-05-14 | Discharge: 2022-05-14 | Disposition: A | Discharge status: Home / Self Care | Payer: Medicare Other | Source: Ambulatory Visit | Attending: Sports Medicine | Admitting: Sports Medicine

## 2022-05-14 VITALS — BP 180/102 | Ht 68.0 in | Wt 173.0 lb

## 2022-05-14 DIAGNOSIS — M25512 Pain in left shoulder: Secondary | ICD-10-CM | POA: Diagnosis present

## 2022-05-14 DIAGNOSIS — G8929 Other chronic pain: Secondary | ICD-10-CM

## 2022-05-14 MED ORDER — METHYLPREDNISOLONE ACETATE 40 MG/ML IJ SUSP
40.0000 mg | Freq: Once | INTRAMUSCULAR | Status: AC
  Administered 2022-05-14: 40 mg via INTRA_ARTICULAR

## 2022-05-14 NOTE — Progress Notes (Unsigned)
[name] - 37 y.o. male MRN [id number]  Date of birth: 06-29-1985    CHIEF COMPLAINT:   left shoulder pain    SUBJECTIVE:   HPI:  Pleasant 64 male with history of Alport syndrome, ESRD on dialysis presents to clinic to be evaluated for left shoulder pain.  He says he has had shoulder pain off and on for years now.  It is progressively gotten worse over the last several months, especially in the left shoulder.  He feels that his shoulder is making a popping noise whenever he moves it.  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  The shoulder pain is made worse by lifting objects overhead.  He describes sharp pain diffusely throughout the shoulder.  It does not radiate.  Other than his repetitive lifting with his job, he denies any acute inciting injury or trauma.  He does report several instances of feeling like the shoulder has popped out of place and needs to be pushed back in.  He denies any numbness or tingling down the arm.  He has tried taking Tylenol but no other medicines.  He cannot take NSAIDs due to his renal disease.  ROS:     See HPI  PERTINENT  PMH / PSH FH / / SH:  Past Medical, Surgical, Social, and Family History Reviewed & Updated in the EMR.  Pertinent findings include:  Alport syndrome  OBJECTIVE: Ht 5\' 8"  (1.727 m)   Wt 173 lb (78.5 kg)   BMI 26.30 kg/m   Physical Exam:  Vital signs are reviewed.  GEN: Alert and oriented, NAD Pulm: Breathing unlabored PSY: normal mood, congruent affect  MSK: Left shoulder -no obvious deformity.  He is nontender to palpation over the clavicle, over the [facility] joint, or over the biceps tendon.  Range of motion active abduction to 120 degrees, active forward flexion to 90 degrees, active external rotation to 75 degrees, active internal rotation to mid lumbar spine level.  4/5 strength with resisted internal rotation.  4/5 strength with resisted external rotation.  4/5 strength  with supraspinatus testing with empty can test.  Positive Hawkins test.  Negative Neer's test.  Negative speeds test.  Negative apprehension test.  He is neurovascular intact distally.  ASSESSMENT & PLAN:  1.  Left shoulder pain  -Differential includes impingement versus rotator cuff tendinitis versus instability.  Will start by getting an x-ray of his shoulder to evaluate the integrity of his glenohumeral joint, and evaluate for bony Bankart/Hill-Sachs lesion.  Since he is limited in his ability to take NSAIDs, will treat him with a subacromial corticosteroid injection today.  Tolerated procedure well.  See the procedure note for details.  Will have him start some home exercise plan for cuff strengthening over the next 4 to 6 weeks after the injection.  If no improvement, especially given his history of somewhat having some instability, I recommend getting an MRI to further evaluate.  All his questions were answered and he agrees to the plan.  Procedure performed:  Left Subacromial Corticosteroid Injection; palpation guided  Consent obtained and verified. Time-out conducted. Noted no overlying erythema, induration, or other signs of local infection. The left posterior subacromial space was palpated and marked. The overlying skin was prepped in a sterile fashion. Topical analgesic spray: Ethyl chloride. Needle: 21 gauge, 1.5 inch Meds: 3 cc 1% lidocaine without epinephrine, 40 mg depo-medrol Completed without difficulty.  Advised to call if fevers/chills,  erythema, induration, drainage, or persistent bleeding.   [name], MD PGY-4, Sports Medicine Fellow [place]  I was the preceptor for this visit and available for immediate consultation Shellia Cleverly, DO

## 2022-05-20 ENCOUNTER — Telehealth: Payer: Self-pay | Admitting: *Deleted

## 2022-05-20 MED ORDER — HYDRALAZINE HCL 100 MG PO TABS: 100.0000 mg | ORAL_TABLET | Freq: Three times a day (TID) | ORAL | 1 refills | Status: DC

## 2022-05-20 NOTE — Telephone Encounter (Signed)
Pt walked in to office today after for Hydralazine Rx to be sent in.  States we changed it end of last month but [facility] says they don't have. Aware we sent it in on 12/29 & receipt confirmed.  But informed pt I would resend now for him.  He is also asking for isosorbide refills. Pt aware forwarding to NP for approval of refills for isosorbide

## 2022-05-21 MED ORDER — ISOSORBIDE MONONITRATE ER 30 MG PO TB24: 30.0000 mg | ORAL_TABLET | Freq: Every day | ORAL | 1 refills | Status: DC

## 2022-05-21 NOTE — Telephone Encounter (Signed)
Per Jaquelyn Bitter ok to refill Imdur as prescribed.  Rx(s) sent to pharmacy electronically.  Attempted to notify patient; no answer and no voicemail set up.

## 2022-05-21 NOTE — Addendum Note (Signed)
Addended by: Ulice Brilliant T on: 05/21/2022 08:52 AM   Modules accepted: Orders

## 2022-05-30 ENCOUNTER — Ambulatory Visit (INDEPENDENT_AMBULATORY_CARE_PROVIDER_SITE_OTHER): Payer: Medicare Other | Admitting: Family Medicine

## 2022-05-30 VITALS — BP 158/98 | HR 94 | Ht 68.0 in | Wt 164.0 lb

## 2022-05-30 DIAGNOSIS — R7309 Other abnormal glucose: Secondary | ICD-10-CM | POA: Diagnosis not present

## 2022-05-30 DIAGNOSIS — I10 Essential (primary) hypertension: Secondary | ICD-10-CM

## 2022-05-30 DIAGNOSIS — L989 Disorder of the skin and subcutaneous tissue, unspecified: Secondary | ICD-10-CM | POA: Diagnosis not present

## 2022-05-30 LAB — POCT GLYCOSYLATED HEMOGLOBIN (HGB A1C): Hemoglobin A1C: 4.6 % (ref 4.0–5.6)

## 2022-05-30 MED ORDER — TRIAMCINOLONE ACETONIDE 0.1 % EX OINT: TOPICAL_OINTMENT | CUTANEOUS | 1 refills | Status: DC

## 2022-05-30 NOTE — Progress Notes (Signed)
    SUBJECTIVE:   CHIEF COMPLAINT / HPI:   [name] is a 37 y.o. male who presents to the [facility] clinic today to discuss the following concerns:   Skin Concerns States that he has a bump on his face that had to "bust" about 2 weeks ago. He states that every day he has to squeeze it and some "white stuff" comes out. States there was significant drainage the first time he burst it. He is concerned that it may be infected. He states that he has an appointment with a Dermatologist on 1/22 in [place]. States that these lesions started when he was in the hospital in December. He has lesions on his back and on his penis that are very itchy. He states that the penile lesions he has been dealing with for a while, but "it got worse when I was in the hospital".   HTN Currently managed by Cardiology.  Current regimen includes Coreg 25 twice daily, hydralazine 100 mg 3 times daily, Imdur 30 mg daily, amlodipine 10 mg daily.  His next follow-up with cardiology is on 1/26.   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 Skin: warm and dry, no rashes noted GU: Penile shaft without skin lesions. Scrotum with clustered area of unroofed/ulcerated lesions to right scrotal sac. Left scrotal sac with area of thickened skin with a single small 1x1cm hyperpigmented papule  Psych: Normal affect and mood  GU exam chaperoned by [name], CMA     ASSESSMENT/PLAN:   1. Abnormal glucose measurement Pt reported elevated sugar readings >140 at home, wanted to be checked for diabetes. Low concern for DM given review of previous BMP/CMP but checked for reassurance purposes. A1c returned at 4.6.  - HgB A1c  2. Skin  lesion Pruritic and hyperpigmented scattered lesions to upper back and on scrotum. Present for at least 1 month on back and several months on scrotum. Has dermatology appointment on Monday. Unclear etiology at this time. Differential includes contact dermatitis vs insect bite vs pityriasis rosea (but lack of harold patch). Considered HSV for genital lesions but absence of vesicles, long-standing rash, and lack of penile lesions this seems unlikely.  -Refilled triamcinolone ointment  -F/u with Derm as scheduled  3. Hypertension, unspecified type BP elevated today but has been much more elevated in the past. He is working closely with the cardiology team for improved control. He has been very adherent to his medications. -Continue medication regimen and f/u with cards as scheduled   [name], [place]

## 2022-05-30 NOTE — Patient Instructions (Addendum)
It was wonderful to see you today.  Please bring ALL of your medications with you to every visit.   Today we talked about:  Follow up with your dermatologist as scheduled. I will refill your steroid ointment in the meantime.  Keep your skin hydrated daily.  Use Cerave face wash. You can also use Differin gel but use this AT NIGHT and wash off IN THE MORNING. Otherwise, it can cause irritation if it interacts with the sun.   Thank you for coming to your visit as scheduled. We have had a large "no-show" problem lately, and this significantly limits our ability to see and care for patients. As a friendly reminder- if you cannot make your appointment please call to cancel. We do have a no show policy for those who do not cancel within 24 hours. Our policy is that if you miss or fail to cancel an appointment within 24 hours, 3 times in a 3-month period, you may be dismissed from our clinic.   Thank you for choosing [facility].   Please call [phone] with any questions about today's appointment.  Please be sure to schedule follow up at the front  desk before you leave today.   [name], DO PGY-3 Family Medicine

## 2022-06-03 ENCOUNTER — Other Ambulatory Visit (HOSPITAL_COMMUNITY): Payer: Medicare Other

## 2022-06-03 ENCOUNTER — Emergency Department (HOSPITAL_BASED_OUTPATIENT_CLINIC_OR_DEPARTMENT_OTHER): Payer: Medicare Other

## 2022-06-03 ENCOUNTER — Emergency Department (HOSPITAL_COMMUNITY): Payer: Medicare Other

## 2022-06-03 ENCOUNTER — Observation Stay (HOSPITAL_COMMUNITY): Payer: Medicare Other

## 2022-06-03 ENCOUNTER — Other Ambulatory Visit: Payer: Self-pay

## 2022-06-03 ENCOUNTER — Inpatient Hospital Stay (HOSPITAL_COMMUNITY)
Admission: EM | Admit: 2022-06-03 | Discharge: 2022-06-06 | DRG: 391 | Disposition: A | Discharge status: Home / Self Care | Payer: Medicare Other | Attending: Family Medicine | Admitting: Family Medicine

## 2022-06-03 DIAGNOSIS — K319 Disease of stomach and duodenum, unspecified: Secondary | ICD-10-CM | POA: Diagnosis not present

## 2022-06-03 DIAGNOSIS — F319 Bipolar disorder, unspecified: Secondary | ICD-10-CM | POA: Diagnosis present

## 2022-06-03 DIAGNOSIS — I10 Essential (primary) hypertension: Secondary | ICD-10-CM | POA: Diagnosis present

## 2022-06-03 DIAGNOSIS — K2980 Duodenitis without bleeding: Secondary | ICD-10-CM | POA: Diagnosis present

## 2022-06-03 DIAGNOSIS — Z822 Family history of deafness and hearing loss: Secondary | ICD-10-CM

## 2022-06-03 DIAGNOSIS — K2960 Other gastritis without bleeding: Secondary | ICD-10-CM | POA: Diagnosis present

## 2022-06-03 DIAGNOSIS — Z841 Family history of disorders of kidney and ureter: Secondary | ICD-10-CM

## 2022-06-03 DIAGNOSIS — Z833 Family history of diabetes mellitus: Secondary | ICD-10-CM

## 2022-06-03 DIAGNOSIS — Z992 Dependence on renal dialysis: Secondary | ICD-10-CM

## 2022-06-03 DIAGNOSIS — Z825 Family history of asthma and other chronic lower respiratory diseases: Secondary | ICD-10-CM

## 2022-06-03 DIAGNOSIS — N186 End stage renal disease: Secondary | ICD-10-CM | POA: Diagnosis present

## 2022-06-03 DIAGNOSIS — Z8249 Family history of ischemic heart disease and other diseases of the circulatory system: Secondary | ICD-10-CM

## 2022-06-03 DIAGNOSIS — K219 Gastro-esophageal reflux disease without esophagitis: Secondary | ICD-10-CM | POA: Diagnosis present

## 2022-06-03 DIAGNOSIS — F209 Schizophrenia, unspecified: Secondary | ICD-10-CM | POA: Diagnosis present

## 2022-06-03 DIAGNOSIS — E162 Hypoglycemia, unspecified: Secondary | ICD-10-CM | POA: Diagnosis present

## 2022-06-03 DIAGNOSIS — R079 Chest pain, unspecified: Secondary | ICD-10-CM

## 2022-06-03 DIAGNOSIS — Z823 Family history of stroke: Secondary | ICD-10-CM

## 2022-06-03 DIAGNOSIS — R109 Unspecified abdominal pain: Secondary | ICD-10-CM | POA: Diagnosis present

## 2022-06-03 DIAGNOSIS — Q8781 Alport syndrome: Secondary | ICD-10-CM

## 2022-06-03 DIAGNOSIS — I3139 Other pericardial effusion (noninflammatory): Secondary | ICD-10-CM

## 2022-06-03 DIAGNOSIS — K859 Acute pancreatitis without necrosis or infection, unspecified: Secondary | ICD-10-CM | POA: Diagnosis not present

## 2022-06-03 DIAGNOSIS — K2970 Gastritis, unspecified, without bleeding: Secondary | ICD-10-CM

## 2022-06-03 DIAGNOSIS — F419 Anxiety disorder, unspecified: Secondary | ICD-10-CM | POA: Diagnosis present

## 2022-06-03 DIAGNOSIS — D631 Anemia in chronic kidney disease: Secondary | ICD-10-CM | POA: Diagnosis present

## 2022-06-03 DIAGNOSIS — F17210 Nicotine dependence, cigarettes, uncomplicated: Secondary | ICD-10-CM | POA: Diagnosis present

## 2022-06-03 DIAGNOSIS — R112 Nausea with vomiting, unspecified: Secondary | ICD-10-CM | POA: Diagnosis present

## 2022-06-03 DIAGNOSIS — R1310 Dysphagia, unspecified: Secondary | ICD-10-CM | POA: Diagnosis present

## 2022-06-03 DIAGNOSIS — Z9049 Acquired absence of other specified parts of digestive tract: Secondary | ICD-10-CM

## 2022-06-03 DIAGNOSIS — Z888 Allergy status to other drugs, medicaments and biological substances status: Secondary | ICD-10-CM

## 2022-06-03 DIAGNOSIS — I5022 Chronic systolic (congestive) heart failure: Secondary | ICD-10-CM | POA: Diagnosis present

## 2022-06-03 DIAGNOSIS — I132 Hypertensive heart and chronic kidney disease with heart failure and with stage 5 chronic kidney disease, or end stage renal disease: Secondary | ICD-10-CM | POA: Diagnosis present

## 2022-06-03 DIAGNOSIS — G43909 Migraine, unspecified, not intractable, without status migrainosus: Secondary | ICD-10-CM | POA: Insufficient documentation

## 2022-06-03 DIAGNOSIS — H9190 Unspecified hearing loss, unspecified ear: Secondary | ICD-10-CM | POA: Diagnosis present

## 2022-06-03 DIAGNOSIS — R1013 Epigastric pain: Secondary | ICD-10-CM

## 2022-06-03 DIAGNOSIS — Z79899 Other long term (current) drug therapy: Secondary | ICD-10-CM

## 2022-06-03 LAB — COMPREHENSIVE METABOLIC PANEL
ALT: 30 U/L (ref 0–44)
AST: 30 U/L (ref 15–41)
Albumin: 3.9 g/dL (ref 3.5–5.0)
Alkaline Phosphatase: 101 U/L (ref 38–126)
Anion gap: 19 — ABNORMAL HIGH (ref 5–15)
BUN: 118 mg/dL — ABNORMAL HIGH (ref 6–20)
CO2: 22 mmol/L (ref 22–32)
Calcium: 9 mg/dL (ref 8.9–10.3)
Chloride: 94 mmol/L — ABNORMAL LOW (ref 98–111)
Creatinine, Ser: 16.85 mg/dL — ABNORMAL HIGH (ref 0.61–1.24)
GFR, Estimated: 3 mL/min — ABNORMAL LOW (ref >60)
Glucose, Bld: 83 mg/dL (ref 70–99)
Potassium: 5.7 mmol/L — ABNORMAL HIGH (ref 3.5–5.1)
Sodium: 135 mmol/L (ref 135–145)
Total Bilirubin: 0.6 mg/dL (ref 0.3–1.2)
Total Protein: 6.4 g/dL — ABNORMAL LOW (ref 6.5–8.1)

## 2022-06-03 LAB — CBC WITH DIFFERENTIAL/PLATELET
Abs Immature Granulocytes: 0 10*3/uL (ref 0.00–0.07)
Basophils Absolute: 0 10*3/uL (ref 0.0–0.1)
Basophils Relative: 1 %
Eosinophils Absolute: 0.2 10*3/uL (ref 0.0–0.5)
Eosinophils Relative: 5 %
HCT: 44 % (ref 39.0–52.0)
Hemoglobin: 14.3 g/dL (ref 13.0–17.0)
Immature Granulocytes: 0 %
Lymphocytes Relative: 34 %
Lymphs Abs: 1.3 10*3/uL (ref 0.7–4.0)
MCH: 30.1 pg (ref 26.0–34.0)
MCHC: 32.5 g/dL (ref 30.0–36.0)
MCV: 92.6 fL (ref 80.0–100.0)
Monocytes Absolute: 0.3 10*3/uL (ref 0.1–1.0)
Monocytes Relative: 8 %
Neutro Abs: 1.9 10*3/uL (ref 1.7–7.7)
Neutrophils Relative %: 52 %
Platelets: 179 10*3/uL (ref 150–400)
RBC: 4.75 MIL/uL (ref 4.22–5.81)
RDW: 14.7 % (ref 11.5–15.5)
WBC: 3.8 10*3/uL — ABNORMAL LOW (ref 4.0–10.5)
nRBC: 0 % (ref 0.0–0.2)

## 2022-06-03 LAB — ECHOCARDIOGRAM COMPLETE
Area-P 1/2: 4.17 cm2
Est EF: 55
S' Lateral: 3.6 cm

## 2022-06-03 LAB — LIPASE, BLOOD: Lipase: 81 U/L — ABNORMAL HIGH (ref 11–51)

## 2022-06-03 LAB — MAGNESIUM: Magnesium: 3 mg/dL — ABNORMAL HIGH (ref 1.7–2.4)

## 2022-06-03 LAB — TROPONIN I (HIGH SENSITIVITY)
Troponin I (High Sensitivity): 27 ng/L — ABNORMAL HIGH (ref <18)
Troponin I (High Sensitivity): 29 ng/L — ABNORMAL HIGH (ref <18)

## 2022-06-03 LAB — HEPATITIS B SURFACE ANTIGEN: Hepatitis B Surface Ag: NONREACTIVE

## 2022-06-03 MED ORDER — ALUM & MAG HYDROXIDE-SIMETH 200-200-20 MG/5ML PO SUSP
15.0000 mL | Freq: Once | ORAL | Status: AC
  Administered 2022-06-03: 15 mL via ORAL
  Filled 2022-06-03: qty 30

## 2022-06-03 MED ORDER — HEPARIN SODIUM (PORCINE) 5000 UNIT/ML IJ SOLN
5000.0000 [IU] | Freq: Three times a day (TID) | INTRAMUSCULAR | Status: AC
  Administered 2022-06-04 – 2022-06-05 (×6): 5000 [IU] via SUBCUTANEOUS
  Filled 2022-06-03 (×6): qty 1

## 2022-06-03 MED ORDER — ACETAMINOPHEN 325 MG PO TABS: 650.0000 mg | ORAL_TABLET | Freq: Once | ORAL | Status: DC

## 2022-06-03 MED ORDER — ONDANSETRON HCL 4 MG PO TABS
4.0000 mg | ORAL_TABLET | Freq: Four times a day (QID) | ORAL | Status: DC | PRN
  Administered 2022-06-04 (×2): 4 mg via ORAL
  Filled 2022-06-03 (×2): qty 1

## 2022-06-03 MED ORDER — HYDROMORPHONE HCL 1 MG/ML IJ SOLN
0.5000 mg | INTRAMUSCULAR | Status: DC | PRN
  Administered 2022-06-03: 1 mg via INTRAVENOUS
  Administered 2022-06-04: 0.5 mg via INTRAVENOUS
  Administered 2022-06-05: 1 mg via INTRAVENOUS
  Filled 2022-06-03 (×4): qty 1

## 2022-06-03 MED ORDER — POLYETHYLENE GLYCOL 3350 17 G PO PACK: 17.0000 g | PACK | Freq: Every day | ORAL | Status: DC | PRN

## 2022-06-03 MED ORDER — SUCRALFATE 1 G PO TABS
1.0000 g | ORAL_TABLET | Freq: Two times a day (BID) | ORAL | Status: DC
  Administered 2022-06-04 – 2022-06-05 (×5): 1 g via ORAL
  Filled 2022-06-03 (×7): qty 1

## 2022-06-03 MED ORDER — HEPARIN SODIUM (PORCINE) 1000 UNIT/ML DIALYSIS
1200.0000 [IU] | INTRAMUSCULAR | Status: DC | PRN
  Administered 2022-06-05: 1200 [IU] via INTRAVENOUS_CENTRAL
  Filled 2022-06-03: qty 2

## 2022-06-03 MED ORDER — CHLORHEXIDINE GLUCONATE CLOTH 2 % EX PADS
6.0000 | MEDICATED_PAD | Freq: Every day | CUTANEOUS | Status: DC
  Administered 2022-06-04 – 2022-06-06 (×2): 6 via TOPICAL

## 2022-06-03 MED ORDER — HEPARIN SODIUM (PORCINE) 1000 UNIT/ML IJ SOLN
1200.0000 [IU] | Freq: Once | INTRAMUSCULAR | Status: AC
  Administered 2022-06-03: 1200 [IU] via INTRAVENOUS

## 2022-06-03 MED ORDER — CARVEDILOL 25 MG PO TABS
25.0000 mg | ORAL_TABLET | Freq: Two times a day (BID) | ORAL | Status: DC
  Administered 2022-06-04 – 2022-06-06 (×6): 25 mg via ORAL
  Filled 2022-06-03 (×7): qty 1

## 2022-06-03 MED ORDER — HYDRALAZINE HCL 100 MG PO TABS: 100.0000 mg | ORAL_TABLET | Freq: Three times a day (TID) | ORAL | Status: DC

## 2022-06-03 MED ORDER — ACETAMINOPHEN 500 MG PO TABS
1000.0000 mg | ORAL_TABLET | Freq: Once | ORAL | Status: AC
  Administered 2022-06-03: 1000 mg via ORAL
  Filled 2022-06-03: qty 2

## 2022-06-03 MED ORDER — IOHEXOL 350 MG/ML SOLN
100.0000 mL | Freq: Once | INTRAVENOUS | Status: AC | PRN
  Administered 2022-06-03: 100 mL via INTRAVENOUS

## 2022-06-03 MED ORDER — PANTOPRAZOLE SODIUM 40 MG IV SOLR: 40.0000 mg | Freq: Two times a day (BID) | INTRAVENOUS | Status: DC

## 2022-06-03 MED ORDER — ONDANSETRON HCL 4 MG/2ML IJ SOLN
4.0000 mg | Freq: Four times a day (QID) | INTRAMUSCULAR | Status: DC | PRN
  Filled 2022-06-03: qty 2

## 2022-06-03 MED ORDER — HYDRALAZINE HCL 50 MG PO TABS
100.0000 mg | ORAL_TABLET | Freq: Three times a day (TID) | ORAL | Status: DC
  Administered 2022-06-04 – 2022-06-06 (×7): 100 mg via ORAL
  Filled 2022-06-03 (×9): qty 2

## 2022-06-03 MED ORDER — MORPHINE SULFATE (PF) 2 MG/ML IV SOLN
2.0000 mg | Freq: Once | INTRAVENOUS | Status: AC
  Administered 2022-06-03: 2 mg via INTRAVENOUS
  Filled 2022-06-03: qty 1

## 2022-06-03 MED ORDER — ACETAMINOPHEN 325 MG PO TABS
650.0000 mg | ORAL_TABLET | Freq: Four times a day (QID) | ORAL | Status: DC
  Administered 2022-06-04 – 2022-06-05 (×6): 650 mg via ORAL
  Filled 2022-06-03 (×8): qty 2

## 2022-06-03 MED ORDER — OXYCODONE HCL 5 MG PO TABS
5.0000 mg | ORAL_TABLET | Freq: Once | ORAL | Status: AC
  Administered 2022-06-03: 5 mg via ORAL
  Filled 2022-06-03: qty 1

## 2022-06-03 MED ORDER — POLYETHYLENE GLYCOL 3350 17 G PO PACK
17.0000 g | PACK | Freq: Every day | ORAL | Status: DC
  Filled 2022-06-03: qty 1

## 2022-06-03 MED ORDER — AMLODIPINE BESYLATE 10 MG PO TABS
10.0000 mg | ORAL_TABLET | Freq: Every day | ORAL | Status: DC
  Administered 2022-06-04 – 2022-06-06 (×3): 10 mg via ORAL
  Filled 2022-06-03 (×4): qty 1

## 2022-06-03 MED ORDER — ACETAMINOPHEN 325 MG PO TABS
ORAL_TABLET | ORAL | Status: AC
  Administered 2022-06-04: 650 mg via ORAL
  Filled 2022-06-03: qty 2

## 2022-06-03 MED ORDER — ISOSORBIDE MONONITRATE ER 30 MG PO TB24
30.0000 mg | ORAL_TABLET | Freq: Every day | ORAL | Status: DC
  Administered 2022-06-04 – 2022-06-06 (×3): 30 mg via ORAL
  Filled 2022-06-03 (×4): qty 1

## 2022-06-03 MED ORDER — CARVEDILOL 12.5 MG PO TABS: 25.0000 mg | ORAL_TABLET | Freq: Two times a day (BID) | ORAL | Status: DC

## 2022-06-03 MED ORDER — PANTOPRAZOLE SODIUM 40 MG PO TBEC
40.0000 mg | DELAYED_RELEASE_TABLET | Freq: Every day | ORAL | Status: DC
  Administered 2022-06-04: 40 mg via ORAL
  Filled 2022-06-03: qty 1

## 2022-06-03 MED ORDER — PANTOPRAZOLE SODIUM 40 MG IV SOLR: 40.0000 mg | Freq: Every day | INTRAVENOUS | Status: DC

## 2022-06-03 NOTE — Progress Notes (Incomplete)
Asked to see patient for dialysis. Pt presented to ED w/ abd pain onset last week. Had HD last week on Tu-Th-Friday. HD would be due today. In ED BP's high 193/128 >> 163/ 101 after po oxycodone, maalox and tylenol. Hx of bipolar, HFrEF, Alports w/ HOH, HTN. Tobacco use. Pain is similar to prior episodes of pancreatitis per the ED MD's notes. Pt seen in ED, on RA, sats okay. BP's a bit better. States has not missed HD. Chest is clear, no LE edema. CT chest neg for edema.  Patient is not being admitted --> plan will be for "ED HD" where the patient will be returned to the ED after dialysis for reassessment.    Na 135  K+ 5.7  BUN 118  Cr 16   LFT"s okay, lab 3.9 lipast 81  WBC 3K  Hb 14  CTA chest/ abd / pelv for dissection --> IMPRESSION: 1. No evidence of pulmonary embolism. 2. Small to moderate sized pericardial effusion, increased in size compared to the prior study. 3. No evidence of an acute or active process within the abdomen or pelvis.  OP HD: [facility] TTS 4h  400/1.5  73kg  2/2 bath  AVF   Hep 1200 - last HD 1/19, post wt 74.1kg  Kelly Splinter, MD 06/03/2022, 11:44 AM  Recent Labs  Lab 06/03/22 0231  HGB 14.3  ALBUMIN 3.9  CALCIUM 9.0  CREATININE 16.85*  K 5.7*    Inpatient medications:

## 2022-06-03 NOTE — Assessment & Plan Note (Addendum)
Lipase 81.  CTA AP negative for pancreatitis.  Less likely to be cardiac in nature EKG WNL, echo consistent from prior to study, troponin flat.  Echo 1/22: LVEF 55%, moderate concentric LVH, small pericardial effusion present but less prominent from prior study 02/2022. - Admit to FMTS, attending Dr. Gwendlyn Deutscher - MedSurg, Vital signs per floor - Clear liquid diet, advance as tolerated - PT/OT to treat - VTE prophylaxis heparin - AM CBC/RFP - RUQ U/S ordered  - Protonix 40 mg daily -started carafate  Pain control  -Tylenol 650 mg scheduled Q6H  -Dilaudid 0.5-1 mg Q3H PRN breakthrough pain - MiraLAX, senna ordered daily while on narcotics - Zofran 4 mg as needed Q6H

## 2022-06-03 NOTE — Assessment & Plan Note (Addendum)
S/p HD 1/22.  Normal HD schedule TTS.  - Nephrology consulted, appreciate recommendations - Continue dialysis schedule while hospitalized - Avoid morphine for pain control -avoid nephrotoxic agents

## 2022-06-03 NOTE — H&P (Cosign Needed Addendum)
Hospital Admission History and Physical Service Pager: [phone]  Patient name: [name] Medical record number: [id number] Date of Birth: 22-Jul-1985 Age: 37 y.o. Gender: male  Primary Care Provider: [name], DO Consultants: Nephrology Code Status: Full code Preferred Emergency Contact:  Contact Information     Name Relation Home Work Mobile   [place] Spouse [phone]     Marnee Guarneri [phone]  [phone]   Graciela Husbands   [phone]      Chief Complaint: Epigastric abdominal pain  Assessment and Plan: [name] is a 37 y.o. male presenting with severe episodic epigastric abdominal pain. Differential for this patient's presentation of this includes pancreatitis (most likely as pain consistent with prior pancreatitis,  lipase 81 which is mildly elevated compared to his prior admission for pancreatitis, however abdominal imaging negative for inflammation); gastric ulcer (pain is worse with eating, has history of GERD); cholecystitis (gallbladder present, pain worse with eating, less likely as LFTs WNL); IBS exacerbation (prior history, less likely as bowel habits appear at baseline); AAA (ruled out by CTA abdomen pelvis).   * Epigastric abdominal pain Lipase 81.  CTA AP negative for pancreatitis.  Less likely to be cardiac in nature EKG WNL, echo consistent from prior to study, troponin flat.  Echo 1/22: LVEF 55%, moderate concentric LVH, small pericardial effusion present but less prominent from prior study 02/2022. - Admit to FMTS, attending Dr. Gwendlyn Deutscher - MedSurg, Vital signs per floor - Clear liquid diet, advance as tolerated - PT/OT to treat - VTE prophylaxis heparin - AM CBC/RFP - RUQ U/S ordered  - Protonix 40 mg IV BID Pain control  -Tylenol 650 mg scheduled Q6H  -Dilaudid 0.5-1 mg Q3H PRN breakthrough pain - MiraLAX, senna ordered PRN while on narcotics - Zofran 4 mg as needed Q6H  Hypertension Improved s/p  HD Home medications: Amlodipine 10 mg, Coreg 25 mg hydralazine 100 mg TID - Monitor BP - Home medications reordered  ESRD (end stage renal disease) on dialysis ([place]) S/p HD 1/22.  Normal HD schedule TTS.  - Nephrology consulted, appreciate recommendations - Continue dialysis schedule while hospitalized - Avoid morphine for pain control   Chronic and stable conditions: Tobacco abuse: Nicotine patch ordered as needed Alport syndrome   FEN/GI: Clear liquid, advance as tolerated VTE Prophylaxis: Heparin  Disposition: MedSurg, Dr. Gwendlyn Deutscher  History of Present Illness:  [name] is a 37 y.o. male presenting with severe episodic epigastric pain ongoing for 2 days.  He reports good compliance with his medications and HD at home.  He denies nausea and emesis.  Reports his bowel habits have been normal.  He reports taking Protonix 40 mg at home without relief.  History of prior appendectomy.   He describes the pain as sharp and episodic, worse when he eats.  He reports corresponding headache.  He reports his pain is similar to his prior admission for pancreatitis.  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  In the ED, initial presentation was significant for hypertension to 354S systolic 568L diastolic, creatinine 27.51, lipase of 81.  EKG normal sinus rhythm without ST elevation, troponin flat.  Imaging includes CTA chest abdomen pelvis which was negative for AAA and did not show evidence of pancreatitis.  Nephrology was consulted and brought him up for dialysis due to elevated pressures and creatinine.  S/p dialysis nephrology cleared him as stable to be discharged.  Unfortunately his degree of pain was so severe  that FMTS called for admission.  Review Of Systems: Per HPI with the following additions: Denies diarrhea, hematochezia, melena  Pertinent Past Medical History: Bipolar 1 disorder, ESRD on dialysis, depression, GERD,  hypertension Remainder reviewed in history tab.   Pertinent Past Surgical History: Appendectomy AV fistula placement 2020 Remainder reviewed in history tab.   Pertinent Social History: Tobacco use: Yes, 1-2 black and milds daily Alcohol use: Denies Other Substance use: Denies, prior history of marijuana use Lives with self  Pertinent Family History: Mother: Hypertension, kidney failure, diabetes Father: Hearing loss Sister: Hypertension Remainder reviewed in history tab.   Important Outpatient Medications: Calcitriol, PhosLo Protonix 40 mg daily FiberCon Norvasc 10 mg, Coreg 25 mg, hydralazine 100 mg 3 times daily, Imdur 30 mg daily Remainder reviewed in medication history.   Objective: BP (!) 186/116   Pulse 93   Temp 98.1 F (36.7 C) (Oral)   Resp 16   Wt 74.8 kg   SpO2 97%   BMI 25.07 kg/m  Exam: Acutely ill-appearing, no acute distress Cardio: Regular rate, regular rhythm, no murmurs on exam. Pulm: Clear, no wheezing, no crackles. No increased work of breathing Abdominal: bowel sounds present, diffusely tender, concentrated in the epigastric region Extremities: no peripheral edema  Neuro: alert and oriented x3, speech normal in content, no facial asymmetry, strength intact and equal bilaterally in UE and LE, pupils equal and reactive to light.   Labs:  CBC BMET  Recent Labs  Lab 06/03/22 0231  WBC 3.8*  HGB 14.3  HCT 44.0  PLT 179   Recent Labs  Lab 06/03/22 0231  NA 135  K 5.7*  CL 94*  CO2 22  BUN 118*  CREATININE 16.85*  GLUCOSE 83  CALCIUM 9.0    Pertinent additional labs lipase 81, high potatoes B surface antigen nonreactive, troponin flat negative.   EKG: Normal sinus rhythm, normal axis without deviation, no ST elevation  Imaging Studies Performed:  CTA chest/abdomen/pelvis: 1. No evidence of pulmonary embolism. 2. Small to moderate sized pericardial effusion, increased in size compared to the prior study. 3. No evidence of an  acute or active process within the abdomen or pelvis. 4. Atrophic kidneys with stable bilateral renal cysts. No follow-up imaging is recommended. This recommendation follows ACR consensus guidelines: Management of the Incidental Renal Mass on CT: A White Paper of the ACR Incidental Findings Committee. J Am Coll Radiol [phone]. 5. No evidence of aneurysmal dilatation, dissection, major vessel occlusion or hemodynamically significant stenosis involving the thoracic aorta, abdominal aorta and arterial structures within the abdomen and pelvis. 6. Markedly limited evaluation of poorly distended large and small bowel loops, as described above. Follow-up abdomen and pelvis CT following oral contrast administration is recommended if an acute process within the abdomen or pelvis remains of clinical concern.  Pancreas: Unremarkable.  No pancreatic ductal dilation or surrounding inflammatory changes.   Darci Current, DO 06/03/2022, 9:59 PM PGY-1, [place] Intern pager: [phone], text pages welcome Secure chat group [place]   I was personally present and performed or re-performed the history, physical exam and medical decision making activities of this service and have verified that the service and findings are accurately documented in the intern's note. My edits are noted within the note above. Please also see attending's attestation.   Donney Dice, DO                  06/03/2022, 11:54 PM  PGY-3, [place]  Family Medicine

## 2022-06-03 NOTE — ED Notes (Signed)
PT IS EXTREMELY HARD OF HEARING/ DEAF . READ LIPS WELL BUT WILL NOT HEAR NAME BEING CALLED . LOOK AT DEMOGRAPHICS PHOTO OR ASK SORT NT WHOM THE PT IS IF YOU CAN NOT SPOT OUT

## 2022-06-03 NOTE — ED Triage Notes (Signed)
Pt c/o mid upper abdominal pain onset last week while at dialysis.

## 2022-06-03 NOTE — Progress Notes (Signed)
Echocardiogram 2D Echocardiogram has been performed.  Darlina Sicilian M 06/03/2022, 1:00 PM

## 2022-06-03 NOTE — ED Notes (Signed)
Informed consent for dialysis completed with patient, remains at bedside.

## 2022-06-03 NOTE — Procedures (Signed)
HD Note:  Some information was entered later than the data was gathered due to patient care needs. The stated time with the data is accurate.  Received patient in bed to unit.  Alert and oriented.  Informed consent signed and in chart.   Patient complained of extreme pain in his lower abdomen when he arrived on the unit.  He also asked for food.  A snack of a tuna sandwich, apple and rice crispy snack was given once treatment began.  Complaints of pain were head, stomach, hand intermittently.  Patient did rest with eyes closed for short periods of time   Transported back to the ED Alert, without acute distress.  Hand-off given to patient's nurse.   Access used: Right upper arm  Access issues: No issues  Total UF removed: [facility]

## 2022-06-03 NOTE — Assessment & Plan Note (Deleted)
Improved s/p HD Home medications: Amlodipine 10 mg, Coreg 25 mg hydralazine 100 mg TID - Monitor BP - Home medications reordered

## 2022-06-03 NOTE — ED Provider Notes (Signed)
[facility] Provider Note   CSN: [id number] Arrival date & time: 06/03/22  0158     History  Chief Complaint  Patient presents with   Abdominal Pain    [name] is a 37 y.o. male with ESRD on HD TuTHSat, bipolar 1, HFrEF, Alport syndrome with hearing loss, hypertension, tobacco use, housing insecurity, schizophrenia, history of appendectomy who presents with abdominal pain.   Patient is extremely hard of hearing and communication is done by lipreading.   Pt complains of epigastric pain and lower central chest pain, started about 2 days ago, came on suddenly and has been intermittent, describes as a pressure like pulling sensation which radiates from the epigastric area up into his chest.  Denies any associated shortness of breath, cough, fever/chills, nausea or vomiting.  States he is feeling bowel movements and these are normal for him. Patient states that this is exactly the same pain in his prior episodes of pancreatitis.  Patient states that his last dialysis session was on Saturday and they had to stop 40 minutes early because of the abdominal pain.  He states he takes his medications faithfully.  Per chart review patient has frequent emergency department visits for missed dialysis.  Did have a visit in 07/02/2019 for epigastric pain that was ultimately diagnosed as gastritis and lipase was negative at that time.  Patient symptoms that they resolved with Maalox.  Another presentation for epigastric pain in 2014 did not yield any findings of pancreatitis either.   Abdominal Pain      Home Medications Prior to Admission medications   Medication Sig Start Date End Date Taking? Authorizing Provider  acetaminophen (TYLENOL) 325 MG tablet Take 2 tablets (650 mg total) by mouth every 6 (six) hours as needed for mild pain (or Fever >/= 101). 04/14/22   Lowry Ram, MD  amLODipine (NORVASC) 10 MG tablet TAKE 1 TABLET BY MOUTH EVERY DAY  04/10/22   [name], DO  calcitRIOL (ROCALTROL) 0.25 MCG capsule Take 0.25 mcg by mouth daily.    [provider]  calcium acetate (PHOSLO) 667 MG capsule Take 1 capsule (667 mg total) by mouth 3 (three) times daily with meals. 01/04/22   August Albino, MD  carvedilol (COREG) 25 MG tablet TAKE 1 TABLET BY MOUTH TWICE A DAY 05/14/22   [name], DO  hydrALAZINE (APRESOLINE) 100 MG tablet Take 1 tablet (100 mg total) by mouth 3 (three) times daily. 05/20/22   Marylu Lund., NP  hydrocortisone-pramoxine [facility]) 2.5-1 % rectal cream Place rectally 2 (two) times daily. 04/14/22   Lowry Ram, MD  isosorbide mononitrate (IMDUR) 30 MG 24 hr tablet Take 1 tablet (30 mg total) by mouth daily. 05/21/22   Marylu Lund., NP  neomycin-polymyxin-hydrocortisone (CORTISPORIN) OTIC solution Place 3 drops into both ears 4 (four) times daily. For 7 days. Patient not taking: Reported on 05/30/2022 04/08/22   Salvadore Oxford, MD  nicotine polacrilex (NICORETTE) 4 MG gum Take 1 each (4 mg total) by mouth as needed for smoking cessation. Patient not taking: Reported on 05/09/2022 05/01/22   [name], DO  pantoprazole (PROTONIX) 40 MG tablet Take 40 mg by mouth daily.    [provider]  polycarbophil (FIBERCON) 625 MG tablet Take 1 tablet (625 mg total) by mouth daily. Patient not taking: Reported on 05/30/2022 04/17/22   [name], DO  psyllium (HYDROCIL/METAMUCIL) 95 % PACK Take 1 packet by mouth 3 (three) times daily. Patient  not taking: Reported on 05/30/2022 04/17/22   [name], DO  triamcinolone ointment (KENALOG) 0.1 % Apply to scrotum twice daily for itching. Do not use for more than 2 weeks continuously 05/30/22   [name], DO      Allergies    Zestril [lisinopril], Nsaids, and Risperdal [risperidone]    Review of Systems   Review of Systems  Gastrointestinal:  Positive for abdominal pain.   Review of systems Negative for  f/c.  A 10 point review of systems was performed and is negative unless otherwise reported in HPI.  Physical Exam Updated Vital Signs BP (!) 185/118   Pulse 98   Temp 98.1 F (36.7 C) (Oral)   Resp (!) 24   Wt 78.6 kg Comment: Dry weight is reported to be 73 KG per MD order.  SpO2 98%   BMI 26.35 kg/m  Physical Exam General: Normal appearing male, lying in bed.  HEENT: PERRLA, Sclera anicteric, MMM, trachea midline.  Cardiology: RRR, no murmurs/rubs/gallops. BL radial and DP pulses equal bilaterally.  Resp: Normal respiratory rate and effort. CTAB, no wheezes, rhonchi, crackles.  Abd: Soft, non-tender, non-distended. No rebound tenderness or guarding.  GU: Deferred. MSK: No peripheral edema or signs of trauma. Extremities without deformity or TTP. No cyanosis or clubbing. Skin: warm, dry. No rashes or lesions. Back: No CVA tenderness Neuro: A&Ox4, CNs II-XII grossly intact. MAEs. Sensation grossly intact.  Psych: Normal mood and affect.   ED Results / Procedures / Treatments   Labs (all labs ordered are listed, but only abnormal results are displayed) Labs Reviewed  COMPREHENSIVE METABOLIC PANEL - Abnormal; Notable for the following components:      Result Value   Potassium 5.7 (*)    Chloride 94 (*)    BUN 118 (*)    Creatinine, Ser 16.85 (*)    Total Protein 6.4 (*)    GFR, Estimated 3 (*)    Anion gap 19 (*)    All other components within normal limits  CBC WITH DIFFERENTIAL/PLATELET - Abnormal; Notable for the following components:   WBC 3.8 (*)    All other components within normal limits  LIPASE, BLOOD - Abnormal; Notable for the following components:   Lipase 81 (*)    All other components within normal limits  MAGNESIUM - Abnormal; Notable for the following components:   Magnesium 3.0 (*)    All other components within normal limits  TROPONIN I (HIGH SENSITIVITY) - Abnormal; Notable for the following components:   Troponin I (High Sensitivity) 27 (*)    All  other components within normal limits  TROPONIN I (HIGH SENSITIVITY) - Abnormal; Notable for the following components:   Troponin I (High Sensitivity) 29 (*)    All other components within normal limits  HEPATITIS B SURFACE ANTIGEN  HEPATITIS B SURFACE ANTIBODY, QUANTITATIVE    EKG EKG Interpretation  Date/Time:  Tuesday June 03 2022 02:32:19 EST Ventricular Rate:  88 PR Interval:  158 QRS Duration: 86 QT Interval:  378 QTC Calculation: 457 R Axis:   26 Text Interpretation: Normal sinus rhythm Nonspecific ST and T wave abnormality Abnormal ECG When compared with ECG of 12-Apr-2022 17:44, No significant change was found Confirmed by Delora Fuel ([id number]) on 06/03/2022 4:48:48 AM  Radiology ECHOCARDIOGRAM COMPLETE  Result Date: 06/03/2022    ECHOCARDIOGRAM REPORT   Patient Name:   KACETON VIEAU [facility] Date of Exam: 06/03/2022 Medical Rec #:  [id number]       Height:  68.0 in Accession #:    [id number]      Weight:       164.0 lb Date of Birth:  1986/03/04       BSA:          1.878 m Patient Age:    36 years        BP:           166/108 mmHg Patient Gender: M               HR:           79 bpm. Exam Location:  Inpatient Procedure: 2D Echo, Cardiac Doppler and Color Doppler STAT ECHO Indications:    Pericardial effusion I31.3  History:        Patient has prior history of Echocardiogram examinations, most                 recent 03/03/2022. Risk Factors:Hypertension. Chronic kidney                 disease.  Sonographer:    Darlina Sicilian RDCS Referring Phys: [id number] Marae Cottrell N Latrease Kunde  Sonographer Comments: Imaging in supine position due to pain. IMPRESSIONS  1. Compared to previous echo, LVEF is improved . Left ventricular ejection fraction, by estimation, is 55%. The left ventricle has normal function. The left ventricle has no regional wall motion abnormalities. There is moderate concentric left ventricular hypertrophy. Left ventricular diastolic parameters were normal.  2. Right ventricular systolic  function is normal. The right ventricular size is normal.  3. Compared to echo from October 2023, pericardial effusion is a little less prominence.Marland Kitchen a small pericardial effusion is present. The pericardial effusion is circumferential.  4. The mitral valve is abnormal. Trivial mitral valve regurgitation. No evidence of mitral stenosis.  5. The aortic valve is tricuspid. Aortic valve regurgitation is not visualized. No aortic stenosis is present.  6. The inferior vena cava is dilated in size with <50% respiratory variability, suggesting right atrial pressure of 15 mmHg. FINDINGS  Left Ventricle: Compared to previous echo, LVEF is improved. Left ventricular ejection fraction, by estimation, is 55%. The left ventricle has normal function. The left ventricle has no regional wall motion abnormalities. The left ventricular internal cavity size was normal in size. There is moderate concentric left ventricular hypertrophy. Left ventricular diastolic parameters were normal. Right Ventricle: The right ventricular size is normal. No increase in right ventricular wall thickness. Right ventricular systolic function is normal. Left [facility]: Left atrial size was normal in size. Right [facility]: Right atrial size was normal in size. Pericardium: Compared to echo from October 2023, pericardial effusion is a little less prominence. A small pericardial effusion is present. The pericardial effusion is circumferential. Mitral Valve: The mitral valve is abnormal. Trivial mitral valve regurgitation. No evidence of mitral valve stenosis. Tricuspid Valve: The tricuspid valve is grossly normal. Tricuspid valve regurgitation is trivial. No evidence of tricuspid stenosis. Aortic Valve: The aortic valve is tricuspid. Aortic valve regurgitation is not visualized. No aortic stenosis is present. Pulmonic Valve: The pulmonic valve was grossly normal. Pulmonic valve regurgitation is not visualized. No evidence of pulmonic stenosis. Aorta: The aortic root  and ascending aorta are structurally normal, with no evidence of dilitation. Venous: The inferior vena cava is dilated in size with less than 50% respiratory variability, suggesting right atrial pressure of 15 mmHg. IAS/Shunts: No atrial level shunt detected by color flow Doppler.  LEFT VENTRICLE PLAX 2D LVIDd:  5.00 cm   Diastology LVIDs:         3.60 cm   LV e' medial:    7.62 cm/s LV PW:         1.50 cm   LV E/e' medial:  14.8 LV IVS:        1.40 cm   LV e' lateral:   9.99 cm/s LVOT diam:     2.00 cm   LV E/e' lateral: 11.3 LV SV:         78 LV SV Index:   42 LVOT Area:     3.14 cm  RIGHT VENTRICLE RV S prime:     14.80 cm/s TAPSE (M-mode): 2.5 cm LEFT ATRIUM             Index        RIGHT ATRIUM           Index LA diam:        3.80 cm 2.02 cm/m   RA Area:     13.00 cm LA Vol (A2C):   62.2 ml 33.11 ml/m  RA Volume:   30.30 ml  16.13 ml/m LA Vol (A4C):   55.3 ml 29.44 ml/m LA Biplane Vol: 59.4 ml 31.62 ml/m  AORTIC VALVE LVOT Vmax:   130.00 cm/s LVOT Vmean:  88.700 cm/s LVOT VTI:    0.249 m  AORTA Ao Root diam: 3.10 cm Ao Asc diam:  3.10 cm MITRAL VALVE MV Area (PHT): 4.17 cm     SHUNTS MV Decel Time: 182 msec     Systemic VTI:  0.25 m MV E velocity: 113.00 cm/s  Systemic Diam: 2.00 cm MV A velocity: 51.90 cm/s MV E/A ratio:  2.18 Dorris Carnes MD Electronically signed by Dorris Carnes MD Signature Date/Time: 06/03/2022/1:22:51 PM    Final    CT Angio Chest/Abd/Pel for Dissection W and/or Wo Contrast  Result Date: 06/03/2022 CLINICAL DATA:  Sudden epigastric pain. EXAM: CT ANGIOGRAPHY CHEST, ABDOMEN AND PELVIS TECHNIQUE: Non-contrast CT of the chest was initially obtained. Multidetector CT imaging through the chest, abdomen and pelvis was performed using the standard protocol during bolus administration of intravenous contrast. Multiplanar reconstructed images and MIPs were obtained and reviewed to evaluate the vascular anatomy. RADIATION DOSE REDUCTION: This exam was performed according to the  departmental dose-optimization program which includes automated exposure control, adjustment of the mA and/or kV according to patient size and/or use of iterative reconstruction technique. CONTRAST:  178mL OMNIPAQUE IOHEXOL 350 MG/ML SOLN COMPARISON:  March 02, 2022, May 18, 2021 and July 16, 2017 FINDINGS: CTA CHEST FINDINGS Cardiovascular: The thoracic aorta is normal in appearance. Satisfactory opacification of the pulmonary arteries to the segmental level. No evidence of pulmonary embolism. Normal heart size. A 1.2 cm thick pericardial effusion is seen (approximately 1.18 Hounsfield units). This is most prominent along the posterior and lateral aspect of the left ventricle and is increased in size when compared to the prior study. Mediastinum/Nodes: No enlarged mediastinal, hilar, or axillary lymph nodes. Thyroid gland, trachea, and esophagus demonstrate no significant findings. Lungs/Pleura: There is no evidence of acute infiltrate, pleural effusion or pneumothorax. Musculoskeletal: No chest wall abnormality. No acute or significant osseous findings. Review of the MIP images confirms the above findings. CTA ABDOMEN AND PELVIS FINDINGS VASCULAR Aorta: Normal caliber aorta without aneurysm, dissection, vasculitis or significant stenosis. Celiac: Patent without evidence of aneurysm, dissection, vasculitis or significant stenosis. SMA: Patent without evidence of aneurysm, dissection, vasculitis or significant stenosis. Renals: Both renal arteries are patent  without evidence of aneurysm, dissection, vasculitis, fibromuscular dysplasia or significant stenosis. IMA: Patent without evidence of aneurysm, dissection, vasculitis or significant stenosis. Inflow: Patent without evidence of aneurysm, dissection, vasculitis or significant stenosis. Veins: No obvious venous abnormality within the limitations of this arterial phase study. Review of the MIP images confirms the above findings. NON-VASCULAR Hepatobiliary: No  focal liver abnormality is seen. The gallbladder is contracted. No gallstones, gallbladder wall thickening, or biliary dilatation. Pancreas: Unremarkable. No pancreatic ductal dilatation or surrounding inflammatory changes. Spleen: Normal in size without focal abnormality. Adrenals/Urinary Tract: Adrenal glands are unremarkable. The kidneys are atrophic in appearance, without evidence of renal calculi or hydronephrosis. Oval stable bilateral renal cysts are seen. Bladder is unremarkable. Stomach/Bowel: Stomach is within normal limits. The appendix is not clearly identified. The small bowel loops seen throughout the abdomen and pelvis are poorly distended and subsequently limited in evaluation. Stool is seen throughout the ascending and transverse colon. No evidence of bowel dilatation. Lymphatic: Subcentimeter para-aortic and bilateral inguinal lymph nodes are noted. Reproductive: Prostate is unremarkable. Other: No abdominal wall hernia or abnormality. No abdominopelvic ascites. Musculoskeletal: No acute or significant osseous findings. Review of the MIP images confirms the above findings. IMPRESSION: 1. No evidence of pulmonary embolism. 2. Small to moderate sized pericardial effusion, increased in size compared to the prior study. 3. No evidence of an acute or active process within the abdomen or pelvis. 4. Atrophic kidneys with stable bilateral renal cysts. No follow-up imaging is recommended. This recommendation follows ACR consensus guidelines: Management of the Incidental Renal Mass on CT: A White Paper of the ACR Incidental Findings Committee. J Am Coll Radiol [phone]. 5. No evidence of aneurysmal dilatation, dissection, major vessel occlusion or hemodynamically significant stenosis involving the thoracic aorta, abdominal aorta and arterial structures within the abdomen and pelvis. 6. Markedly limited evaluation of poorly distended large and small bowel loops, as described above. Follow-up abdomen  and pelvis CT following oral contrast administration is recommended if an acute process within the abdomen or pelvis remains of clinical concern. Electronically Signed   By: Virgina Norfolk M.D.   On: 06/03/2022 03:54    Procedures Procedures    Medications Ordered in ED Medications  Chlorhexidine Gluconate Cloth 2 % PADS 6 each (has no administration in time range)  heparin injection 1,200 Units (has no administration in time range)  iohexol (OMNIPAQUE) 350 MG/ML injection 100 mL (100 mLs Intravenous Contrast Given 06/03/22 0319)  alum & mag hydroxide-simeth (MAALOX/MYLANTA) 200-200-20 MG/5ML suspension 15 mL (15 mLs Oral Given 06/03/22 0739)  acetaminophen (TYLENOL) tablet 1,000 mg (1,000 mg Oral Given 06/03/22 1007)  oxyCODONE (Oxy IR/ROXICODONE) immediate release tablet 5 mg (5 mg Oral Given 06/03/22 1007)    ED Course/ Medical Decision Making/ A&P                          Medical Decision Making Amount and/or Complexity of Data Reviewed Labs:  Decision-making details documented in ED Course. Radiology:  Decision-making details documented in ED Course.  Risk OTC drugs. Prescription drug management.    This patient presents to the ED for concern of epigastric/chest pain, this involves an extensive number of treatment options, and is a complaint that carries with it a high risk of complications and morbidity.  I considered the following differential and admission for this acute, potentially life threatening condition.   MDM:    DDX for chest pain includes but is not limited to:  For patient's  epigastric/chest pain, consider pancreatitis given that he reports he has had pancreatitis before.  However extensive chart review of all ED visits back through 2014 does not demonstrate any history of pancreatitis, will check a lipase here.  Consider also ACS/arrhythmia, aortic dissection, PNA, biliary disease, GERD/PUD/gastritis, or musculoskeletal pain.  Noncontrast chest abdomen pelvis CT  ordered from triage to assess for dissection.  Does not have any shortness of breath or signs or symptoms of DVT to suggest PE, do not believe it is the most likely diagnosis.  Consider GERD/gastritis, given known history and prior improvement with Maalox, if imaging is negative we will try Maalox. Patient is having pain in his chest in the setting of blood pressure is 197/123.  Consider hypertensive emergency.  Patient is likely due for dialysis today, will consult with nephrology.  This may also improve his chest pain.  Clinical Course as of 06/03/22 1610  Tue Jun 03, 2022  0708 CT Angio Chest/Abd/Pel for Dissection W and/or Wo Contrast 1. No evidence of pulmonary embolism. 2. Small to moderate sized pericardial effusion, increased in size compared to the prior study. 3. No evidence of an acute or active process within the abdomen or pelvis. 4. Atrophic kidneys with stable bilateral renal cysts. No follow-up imaging is recommended. This recommendation follows ACR consensus guidelines: Management of the Incidental Renal Mass on CT: A White Paper of the ACR Incidental Findings Committee. J Am Coll Radiol [phone]. 5. No evidence of aneurysmal dilatation, dissection, major vessel occlusion or hemodynamically significant stenosis involving the thoracic aorta, abdominal aorta and arterial structures within the abdomen and pelvis. 6. Markedly limited evaluation of poorly distended large and small bowel loops, as described above. Follow-up abdomen and pelvis CT following oral contrast administration is recommended if an acute process within the abdomen or pelvis remains of clinical concern.   [HN]  0709 Potassium(!): 5.7 [HN]  0709 Sodium: 135 [HN]  0709 Troponin I (High Sensitivity)(!): 29 27-29, flat [HN]  0709 Lipase(!): 81 No pancreatitis [HN]  0907 Pt states the maalox did not help at all, maybe made it worse [HN]  0932 Extensive chart review of [facility] as well as  those from [facility] did not demonstrate any history of pancreatitis or elevated lipase. [HN]  [id number] Patient is consulted to nephrology who will dialyze him this afternoon.  Have ordered an echocardiogram also to evaluate the pericardial effusion.  Patient electrolytes are not grossly abnormal and I believe that he will likely improve with dialysis.  Do not believe patient requires admission at this time.  I think he can be reevaluated after dialysis and the echo results [HN]  0939 . [HN]  [id number] Patient currently at dialysis. Patient is signed out to the oncoming ED physician who is made aware of her history, presentation, exam, workup, and plan.  Plan is to reevaluate patient after dialysis.  [HN]    Clinical Course User Index [HN] Audley Hose, MD    Labs: I Ordered, and personally interpreted labs.  The pertinent results include:  those listed above  Imaging Studies ordered: I ordered imaging studies including CXR I independently visualized and interpreted imaging. I agree with the radiologist interpretation  Additional history obtained from chart review.    Reevaluation: After the interventions noted above, I reevaluated the patient and found that they have :stayed the same  Social Determinants of Health: Patient lives independently   Disposition: At dialysis. Will reevaluate after dialysis.  Co morbidities that complicate the  patient evaluation  Past Medical History:  Diagnosis Date   Bipolar 1 disorder ([place])    CKD (chronic kidney disease)    on dialysis   Depression    GERD (gastroesophageal reflux disease)    Hearing difficulty of left ear    75% hearing   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   Hypertension    Low blood sugar    Renal disorder      Medicines Meds ordered this encounter  Medications   iohexol (OMNIPAQUE) 350 MG/ML injection 100 mL   alum & mag hydroxide-simeth (MAALOX/MYLANTA) 200-200-20 MG/5ML suspension 15 mL    acetaminophen (TYLENOL) tablet 1,000 mg   oxyCODONE (Oxy IR/ROXICODONE) immediate release tablet 5 mg   Chlorhexidine Gluconate Cloth 2 % PADS 6 each   heparin injection 1,200 Units    I have reviewed the patients home medicines and have made adjustments as needed  Problem List / ED Course: Problem List Items Addressed This Visit       Cardiovascular and Mediastinum   Hypertension   Relevant Medications   heparin injection 1,200 Units     Other   Chest pain - Primary   Other Visit Diagnoses     Epigastric pain                       This note was created using dictation software, which may contain spelling or grammatical errors.    Audley Hose, MD 06/03/22 [phone]

## 2022-06-03 NOTE — Procedures (Signed)
Asked to see patient for dialysis. Pt presented to ED w/ abd pain onset last week. Had HD last week on Tu-Th-Friday. HD would be due today. In ED BP's high 193/128 >> 163/ 101 after po oxycodone, maalox and tylenol. Hx of bipolar, HFrEF, Alports w/ HOH, HTN. Tobacco use. Pain is similar to prior episodes of pancreatitis per the ED MD's notes. Pt seen in ED, on RA, sats okay. BP's a bit better. States has not missed HD. Chest is clear, no LE edema. CT chest neg for edema.  Patient is not being admitted --> plan will be for "ED HD" where the patient will be returned to the ED after dialysis for reassessment.     Na 135  K+ 5.7  BUN 118  Cr 16   LFT"s okay, lab 3.9 lipast 81  WBC 3K  Hb 14  CTA chest/ abd / pelv for dissection --> IMPRESSION: 1. No evidence of pulmonary embolism. 2. Small to moderate sized pericardial effusion, increased in size compared to the prior study. 3. No evidence of an acute or active process within the abdomen or pelvis.   OP HD: [facility] T[facility] 4h  400/1.5  73kg  2/2 bath  AVF   Hep 1200 - last HD 1/19, post wt 74.1kg  Kelly Splinter, MD 06/03/2022, 4:30 PM  Recent Labs  Lab 06/03/22 0231  HGB 14.3  ALBUMIN 3.9  CALCIUM 9.0  CREATININE 16.85*  K 5.7*    Inpatient medications:  Chlorhexidine Gluconate Cloth  6 each Topical Q0600    heparin

## 2022-06-03 NOTE — ED Notes (Signed)
ED TO INPATIENT HANDOFF REPORT  ED Nurse Name and Phone #: [phone] Lysle Rubens RN  S Name/Age/Gender [name] 37 y.o. male Room/Bed: H021C/H021C  Code Status   Code Status: Full Code  Home/SNF/Other Skilled nursing facility Patient oriented to: self, place, time, and situation Is this baseline? Yes   Triage Complete: Triage complete  Chief Complaint Pancreatitis [K85.90]  Triage Note Pt c/o mid upper abdominal pain onset last week while at dialysis.    Allergies Allergies  Allergen Reactions   Zestril [Lisinopril] Swelling   Nsaids Other (See Comments)    "Kidney problems "   Risperdal [Risperidone] Other (See Comments)    Unknown reaction     Level of Care/Admitting Diagnosis ED Disposition     ED Disposition  Admit   Condition  --   Comment  Hospital Area: [place] [[id number]]  Level of Care: Med-Surg [16]  May place patient in observation at [facility] or [place] if equivalent level of care is available:: Yes  Covid Evaluation: Asymptomatic - no recent exposure (last 10 days) testing not required  Diagnosis: Pancreatitis [240973]  Admitting Physician: Kinnie Feil [[id number]]  Attending Physician: Kinnie Feil [[id number]]          B Medical/Surgery History Past Medical History:  Diagnosis Date   Bipolar 1 disorder ([place])    CKD (chronic kidney disease)    on dialysis   Depression    GERD (gastroesophageal reflux disease)    Hearing difficulty of left ear    75% hearing   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   Hypertension    Low blood sugar    Renal disorder    Past Surgical History:  Procedure Laterality Date   APPENDECTOMY     AV FISTULA PLACEMENT Left 03/03/2019   Procedure: ARTERIOVENOUS (AV) FISTULA CREATION LEFT ARM;  Surgeon: Waynetta Sandy, MD;  Location: [facility];  Service: Vascular;  Laterality: Left;   [place] Right 04/12/2019   Procedure: RIGHT UPPER EXTREMITY  [place] FIRST STAGE FISTULA;  Surgeon: Angelia Mould, MD;  Location: [facility];  Service: Vascular;  Laterality: Right;   BIOPSY  10/14/2021   Procedure: BIOPSY;  Surgeon: Doran Stabler, MD;  Location: [facility];  Service: Gastroenterology;;   COLONOSCOPY WITH PROPOFOL N/A 10/14/2021   Procedure: COLONOSCOPY WITH PROPOFOL;  Surgeon: Doran Stabler, MD;  Location: [facility];  Service: Gastroenterology;  Laterality: N/A;   FLEXIBLE SIGMOIDOSCOPY N/A 04/14/2022   Procedure: FLEXIBLE SIGMOIDOSCOPY;  Surgeon: Mauri Pole, MD;  Location: [facility];  Service: Gastroenterology;  Laterality: N/A;   LIGATION OF ARTERIOVENOUS  FISTULA Left 03/06/2019   Procedure: LIGATION OF ARTERIOVENOUS  FISTULA;  Surgeon: Marty Heck, MD;  Location: [facility];  Service: Vascular;  Laterality: Left;   spinal tap     SPINE SURGERY     related to a spinal infection, unsure of surgery or infection source   WISDOM TOOTH EXTRACTION       A IV Location/Drains/Wounds Patient Lines/Drains/Airways Status     Active Line/Drains/Airways     Name Placement date Placement time Site Days   Peripheral IV 06/03/22 20 G 1" Left Antecubital 06/03/22  0229  Antecubital  less than 1   Fistula / Graft Right Upper arm Arteriovenous fistula 04/12/19  1105  Upper arm  1148            Intake/Output Last 24 hours  Intake/Output Summary (Last 24 hours) at 06/03/2022 2144 Last data filed at 06/03/2022 1937 Gross per 24 hour  Intake --  Output 3200 ml  Net -3200 ml    Labs/Imaging Results for orders placed or performed during the hospital encounter of 06/03/22 (from the past 48 hour(s))  Comprehensive metabolic panel     Status: Abnormal   Collection Time: 06/03/22  2:31 AM  Result Value Ref Range   Sodium 135 135 - 145 mmol/L   Potassium 5.7 (H) 3.5 - 5.1 mmol/L   Chloride 94 (L) 98 - 111 mmol/L   CO2 22 22 - 32 mmol/L   Glucose, Bld 83 70 - 99 mg/dL    Comment: Glucose  reference range applies only to samples taken after fasting for at least 8 hours.   BUN 118 (H) 6 - 20 mg/dL   Creatinine, Ser 16.85 (H) 0.61 - 1.24 mg/dL   Calcium 9.0 8.9 - 10.3 mg/dL   Total Protein 6.4 (L) 6.5 - 8.1 g/dL   Albumin 3.9 3.5 - 5.0 g/dL   AST 30 15 - 41 U/L   ALT 30 0 - 44 U/L   Alkaline Phosphatase 101 38 - 126 U/L   Total Bilirubin 0.6 0.3 - 1.2 mg/dL   GFR, Estimated 3 (L) >60 mL/min    Comment: (NOTE) Calculated using the CKD-EPI Creatinine Equation (2021)    Anion gap 19 (H) 5 - 15    Comment: Performed at [facility], [place] [address], [place], [place] [address]  CBC with Differential     Status: Abnormal   Collection Time: 06/03/22  2:31 AM  Result Value Ref Range   WBC 3.8 (L) 4.0 - 10.5 K/uL   RBC 4.75 4.22 - 5.81 MIL/uL   Hemoglobin 14.3 13.0 - 17.0 g/dL   HCT 44.0 39.0 - 52.0 %   MCV 92.6 80.0 - 100.0 fL   MCH 30.1 26.0 - 34.0 pg   MCHC 32.5 30.0 - 36.0 g/dL   RDW 14.7 11.5 - 15.5 %   Platelets 179 150 - 400 K/uL   nRBC 0.0 0.0 - 0.2 %   Neutrophils Relative % 52 %   Neutro Abs 1.9 1.7 - 7.7 K/uL   Lymphocytes Relative 34 %   Lymphs Abs 1.3 0.7 - 4.0 K/uL   Monocytes Relative 8 %   Monocytes Absolute 0.3 0.1 - 1.0 K/uL   Eosinophils Relative 5 %   Eosinophils Absolute 0.2 0.0 - 0.5 K/uL   Basophils Relative 1 %   Basophils Absolute 0.0 0.0 - 0.1 K/uL   Immature Granulocytes 0 %   Abs Immature Granulocytes 0.00 0.00 - 0.07 K/uL    Comment: Performed at [facility] [address] [place], [place] [address]  Lipase, blood     Status: Abnormal   Collection Time: 06/03/22  2:31 AM  Result Value Ref Range   Lipase 81 (H) 11 - 51 U/L    Comment: Performed at [facility] [address], [place], [place] [address]  Magnesium     Status: Abnormal   Collection Time: 06/03/22  2:31 AM  Result Value Ref Range   Magnesium 3.0 (H) 1.7 - 2.4 mg/dL    Comment: Performed at [name] [address] [place], [place]  [address]  Troponin I (High Sensitivity)     Status: Abnormal   Collection Time: 06/03/22  2:31 AM  Result Value Ref Range   Troponin I (High  Sensitivity) 27 (H) <18 ng/L    Comment: (NOTE) Elevated high sensitivity troponin I (hsTnI) values and significant  changes across serial measurements may suggest ACS but many other  chronic and acute conditions are known to elevate hsTnI results.  Refer to the "Links" section for chest pain algorithms and additional  guidance. Performed at [facility], [place] [address], [place], [place] [address]   Troponin I (High Sensitivity)     Status: Abnormal   Collection Time: 06/03/22  4:33 AM  Result Value Ref Range   Troponin I (High Sensitivity) 29 (H) <18 ng/L    Comment: (NOTE) Elevated high sensitivity troponin I (hsTnI) values and significant  changes across serial measurements may suggest ACS but many other  chronic and acute conditions are known to elevate hsTnI results.  Refer to the "Links" section for chest pain algorithms and additional  guidance. Performed at [facility], [place] [address] [place], [place] [address]   Hepatitis B surface antigen     Status: None   Collection Time: 06/03/22 12:05 PM  Result Value Ref Range   Hepatitis B Surface Ag NON REACTIVE NON REACTIVE    Comment: Performed at [facility] [address] [place], [place] [address]   ECHOCARDIOGRAM COMPLETE  Result Date: 06/03/2022    ECHOCARDIOGRAM REPORT   Patient Name:   MADDYX [facility]EC[facility] Date of Exam: 06/03/2022 Medical Rec #:  [id number]       Height:       68.0 in Accession #:    [id number]      Weight:       164.0 lb Date of Birth:  [birth year]       BSA:          1.878 m Patient Age:    36 years        BP:           166/108 mmHg Patient Gender: M               HR:           79 bpm. Exam Location:  Inpatient Procedure: 2D Echo, Cardiac Doppler and Color Doppler STAT ECHO Indications:    Pericardial effusion I31.3  History:        Patient has prior  history of Echocardiogram examinations, most                 recent 03/03/2022. Risk Factors:Hypertension. Chronic kidney                 disease.  Sonographer:    Darlina Sicilian RDCS Referring Phys: [id number] HAYLEY N NAASZ  Sonographer Comments: Imaging in supine position due to pain. IMPRESSIONS  1. Compared to previous echo, LVEF is improved . Left ventricular ejection fraction, by estimation, is 55%. The left ventricle has normal function. The left ventricle has no regional wall motion abnormalities. There is moderate concentric left ventricular hypertrophy. Left ventricular diastolic parameters were normal.  2. Right ventricular systolic function is normal. The right ventricular size is normal.  3. Compared to echo from October 2023, pericardial effusion is a little less prominence.Marland Kitchen a small pericardial effusion is present. The pericardial effusion is circumferential.  4. The mitral valve is abnormal. Trivial mitral valve regurgitation. No evidence of mitral stenosis.  5. The aortic valve is tricuspid. Aortic valve regurgitation is not visualized. No aortic stenosis is present.  6. The inferior vena cava is dilated in size with <50% respiratory variability,  suggesting right atrial pressure of 15 mmHg. FINDINGS  Left Ventricle: Compared to previous echo, LVEF is improved. Left ventricular ejection fraction, by estimation, is 55%. The left ventricle has normal function. The left ventricle has no regional wall motion abnormalities. The left ventricular internal cavity size was normal in size. There is moderate concentric left ventricular hypertrophy. Left ventricular diastolic parameters were normal. Right Ventricle: The right ventricular size is normal. No increase in right ventricular wall thickness. Right ventricular systolic function is normal. Left Atrium: Left atrial size was normal in size. Right Atrium: Right atrial size was normal in size. Pericardium: Compared to echo from October 2023, pericardial  effusion is a little less prominence. A small pericardial effusion is present. The pericardial effusion is circumferential. Mitral Valve: The mitral valve is abnormal. Trivial mitral valve regurgitation. No evidence of mitral valve stenosis. Tricuspid Valve: The tricuspid valve is grossly normal. Tricuspid valve regurgitation is trivial. No evidence of tricuspid stenosis. Aortic Valve: The aortic valve is tricuspid. Aortic valve regurgitation is not visualized. No aortic stenosis is present. Pulmonic Valve: The pulmonic valve was grossly normal. Pulmonic valve regurgitation is not visualized. No evidence of pulmonic stenosis. Aorta: The aortic root and ascending aorta are structurally normal, with no evidence of dilitation. Venous: The inferior vena cava is dilated in size with less than 50% respiratory variability, suggesting right atrial pressure of 15 mmHg. IAS/Shunts: No atrial level shunt detected by color flow Doppler.  LEFT VENTRICLE PLAX 2D LVIDd:         5.00 cm   Diastology LVIDs:         3.60 cm   LV e' medial:    7.62 cm/s LV PW:         1.50 cm   LV E/e' medial:  14.8 LV IVS:        1.40 cm   LV e' lateral:   9.99 cm/s LVOT diam:     2.00 cm   LV E/e' lateral: 11.3 LV SV:         78 LV SV Index:   42 LVOT Area:     3.14 cm  RIGHT VENTRICLE RV S prime:     14.80 cm/s TAPSE (M-mode): 2.5 cm LEFT ATRIUM             Index        RIGHT ATRIUM           Index LA diam:        3.80 cm 2.02 cm/m   RA Area:     13.00 cm LA Vol (A2C):   62.2 ml 33.11 ml/m  RA Volume:   30.30 ml  16.13 ml/m LA Vol (A4C):   55.3 ml 29.44 ml/m LA Biplane Vol: 59.4 ml 31.62 ml/m  AORTIC VALVE LVOT Vmax:   130.00 cm/s LVOT Vmean:  88.700 cm/s LVOT VTI:    0.249 m  AORTA Ao Root diam: 3.10 cm Ao Asc diam:  3.10 cm MITRAL VALVE MV Area (PHT): 4.17 cm     SHUNTS MV Decel Time: 182 msec     Systemic VTI:  0.25 m MV E velocity: 113.00 cm/s  Systemic Diam: 2.00 cm MV A velocity: 51.90 cm/s MV E/A ratio:  2.18 Dorris Carnes MD  Electronically signed by Dorris Carnes MD Signature Date/Time: 06/03/2022/1:22:51 PM    Final    CT Angio Chest/Abd/Pel for Dissection W and/or Wo Contrast  Result Date: 06/03/2022 CLINICAL DATA:  Sudden epigastric pain. EXAM: CT ANGIOGRAPHY CHEST, ABDOMEN  AND PELVIS TECHNIQUE: Non-contrast CT of the chest was initially obtained. Multidetector CT imaging through the chest, abdomen and pelvis was performed using the standard protocol during bolus administration of intravenous contrast. Multiplanar reconstructed images and MIPs were obtained and reviewed to evaluate the vascular anatomy. RADIATION DOSE REDUCTION: This exam was performed according to the departmental dose-optimization program which includes automated exposure control, adjustment of the mA and/or kV according to patient size and/or use of iterative reconstruction technique. CONTRAST:  1102mL OMNIPAQUE IOHEXOL 350 MG/ML SOLN COMPARISON:  March 02, 2022, May 18, 2021 and July 16, 2017 FINDINGS: CTA CHEST FINDINGS Cardiovascular: The thoracic aorta is normal in appearance. Satisfactory opacification of the pulmonary arteries to the segmental level. No evidence of pulmonary embolism. Normal heart size. A 1.2 cm thick pericardial effusion is seen (approximately 1.18 Hounsfield units). This is most prominent along the posterior and lateral aspect of the left ventricle and is increased in size when compared to the prior study. Mediastinum/Nodes: No enlarged mediastinal, hilar, or axillary lymph nodes. Thyroid gland, trachea, and esophagus demonstrate no significant findings. Lungs/Pleura: There is no evidence of acute infiltrate, pleural effusion or pneumothorax. Musculoskeletal: No chest wall abnormality. No acute or significant osseous findings. Review of the MIP images confirms the above findings. CTA ABDOMEN AND PELVIS FINDINGS VASCULAR Aorta: Normal caliber aorta without aneurysm, dissection, vasculitis or significant stenosis. Celiac: Patent without  evidence of aneurysm, dissection, vasculitis or significant stenosis. SMA: Patent without evidence of aneurysm, dissection, vasculitis or significant stenosis. Renals: Both renal arteries are patent without evidence of aneurysm, dissection, vasculitis, fibromuscular dysplasia or significant stenosis. IMA: Patent without evidence of aneurysm, dissection, vasculitis or significant stenosis. Inflow: Patent without evidence of aneurysm, dissection, vasculitis or significant stenosis. Veins: No obvious venous abnormality within the limitations of this arterial phase study. Review of the MIP images confirms the above findings. NON-VASCULAR Hepatobiliary: No focal liver abnormality is seen. The gallbladder is contracted. No gallstones, gallbladder wall thickening, or biliary dilatation. Pancreas: Unremarkable. No pancreatic ductal dilatation or surrounding inflammatory changes. Spleen: Normal in size without focal abnormality. Adrenals/Urinary Tract: Adrenal glands are unremarkable. The kidneys are atrophic in appearance, without evidence of renal calculi or hydronephrosis. Oval stable bilateral renal cysts are seen. Bladder is unremarkable. Stomach/Bowel: Stomach is within normal limits. The appendix is not clearly identified. The small bowel loops seen throughout the abdomen and pelvis are poorly distended and subsequently limited in evaluation. Stool is seen throughout the ascending and transverse colon. No evidence of bowel dilatation. Lymphatic: Subcentimeter para-aortic and bilateral inguinal lymph nodes are noted. Reproductive: Prostate is unremarkable. Other: No abdominal wall hernia or abnormality. No abdominopelvic ascites. Musculoskeletal: No acute or significant osseous findings. Review of the MIP images confirms the above findings. IMPRESSION: 1. No evidence of pulmonary embolism. 2. Small to moderate sized pericardial effusion, increased in size compared to the prior study. 3. No evidence of an acute or  active process within the abdomen or pelvis. 4. Atrophic kidneys with stable bilateral renal cysts. No follow-up imaging is recommended. This recommendation follows ACR consensus guidelines: Management of the Incidental Renal Mass on CT: A White Paper of the ACR Incidental Findings Committee. J Am Coll Radiol [phone]. 5. No evidence of aneurysmal dilatation, dissection, major vessel occlusion or hemodynamically significant stenosis involving the thoracic aorta, abdominal aorta and arterial structures within the abdomen and pelvis. 6. Markedly limited evaluation of poorly distended large and small bowel loops, as described above. Follow-up abdomen and pelvis CT following oral contrast administration is recommended  if an acute process within the abdomen or pelvis remains of clinical concern. Electronically Signed   By: Virgina Norfolk M.D.   On: 06/03/2022 03:54    Pending Labs Unresulted Labs (From admission, onward)     Start     Ordered   06/04/22 0500  CBC  Tomorrow morning,   R        06/03/22 2128   06/04/22 0500  Renal function panel  Tomorrow morning,   R        06/03/22 2128   06/04/22 0500  Magnesium  Tomorrow morning,   R        06/03/22 2140   06/03/22 1145  Hepatitis B surface antibody,quantitative  (New Admission Hemo Labs (Hepatitis B))  Once,   URGENT        06/03/22 1147            Vitals/Pain Today's Vitals   06/03/22 1800 06/03/22 1852 06/03/22 1900 06/03/22 1937  BP: (!) 194/123 (!) 162/113  (!) 186/116  Pulse: 88 91  93  Resp: 20 16  16   Temp:      TempSrc:      SpO2: 99% 96%  97%  Weight:    74.8 kg  PainSc:   10-Worst pain ever     Isolation Precautions No active isolations  Medications Medications  Chlorhexidine Gluconate Cloth 2 % PADS 6 each (has no administration in time range)  heparin injection 1,200 Units (1,200 Units Dialysis Not Given 06/03/22 1700)  acetaminophen (TYLENOL) 325 MG tablet (has no administration in time range)   acetaminophen (TYLENOL) tablet 650 mg (has no administration in time range)  amLODipine (NORVASC) tablet 10 mg (has no administration in time range)  isosorbide mononitrate (IMDUR) 24 hr tablet 30 mg (has no administration in time range)  heparin injection 5,000 Units (has no administration in time range)  HYDROmorphone (DILAUDID) injection 0.5-1 mg (has no administration in time range)  ondansetron (ZOFRAN) tablet 4 mg (has no administration in time range)    Or  ondansetron (ZOFRAN) injection 4 mg (has no administration in time range)  pantoprazole (PROTONIX) injection 40 mg (has no administration in time range)  polyethylene glycol (MIRALAX / GLYCOLAX) packet 17 g (has no administration in time range)  hydrALAZINE (APRESOLINE) tablet 100 mg (has no administration in time range)  carvedilol (COREG) tablet 25 mg (has no administration in time range)  iohexol (OMNIPAQUE) 350 MG/ML injection 100 mL (100 mLs Intravenous Contrast Given 06/03/22 0319)  alum & mag hydroxide-simeth (MAALOX/MYLANTA) 200-200-20 MG/5ML suspension 15 mL (15 mLs Oral Given 06/03/22 0739)  acetaminophen (TYLENOL) tablet 1,000 mg (1,000 mg Oral Given 06/03/22 1007)  oxyCODONE (Oxy IR/ROXICODONE) immediate release tablet 5 mg (5 mg Oral Given 06/03/22 1007)  heparin sodium (porcine) injection 1,200 Units (1,200 Units Intravenous Given 06/03/22 1700)  morphine (PF) 2 MG/ML injection 2 mg (2 mg Intravenous Given 06/03/22 2043)    Mobility walks     Focused Assessments Cardiac Assessment Handoff:  Cardiac Rhythm: Normal sinus rhythm No results found for: "CKTOTAL", "CKMB", "CKMBINDEX", "TROPONINI" No results found for: "DDIMER" Does the Patient currently have chest pain? No   , Neuro Assessment Handoff:  Swallow screen pass?  Cardiac Rhythm: Normal sinus rhythm       Neuro Assessment:   Neuro Checks:      Has TPA been given? No If patient is a Neuro Trauma and patient is going to OR before floor call report to  [place] nurse: [phone] or  [phone]  , Renal Assessment Handoff:  Hemodialysis Schedule: Hemodialysis Schedule: Tuesday/Thursday/Saturday Last Hemodialysis date and time: 06/03/2022   Restricted appendage: right arm  , Pulmonary Assessment Handoff:  Lung sounds: Bilateral Breath Sounds: Clear O2 Device: Room Air      R Recommendations: See Admitting Provider Note  Report given to:   Additional Notes:

## 2022-06-03 NOTE — ED Provider Triage Note (Signed)
Emergency Medicine Provider Triage Evaluation Note  [name] , a 37 y.o. male  was evaluated in triage.  Pt complains of epigastric/chest pain, started about 2 days ago, came on suddenly, describes as a pressure like pulling sensation which radiates from the epigastric area up into his chest, states pain is constant is gotten worse, no associate nausea or vomiting.  Concerned he might be from pancreatitis, he does dialysis Tuesday Thursday Saturday has not missed any dosages..  Review of Systems  Positive: Epigastric/chest pain Negative: Discharge breath, abdominal pain  Physical Exam  BP (!) 193/128 (BP Location: Left Arm)   Pulse 92   Temp 97.6 F (36.4 C) (Oral)   Resp 20   SpO2 100%  Gen:   Awake, no distress   Resp:  Normal effort  MSK:   Moves extremities without difficulty  Other:    Medical Decision Making  Medically screening exam initiated at 2:25 AM.  Appropriate orders placed.  [name] was informed that the remainder of the evaluation will be completed by another provider, this initial triage assessment does not replace that evaluation, and the importance of remaining in the ED until their evaluation is complete.  Lab work imaging been ordered will need further workup.   Marcello Fennel, PA-C 06/03/22 [phone]

## 2022-06-03 NOTE — ED Provider Notes (Signed)
  Physical Exam  BP (!) 186/116   Pulse 93   Temp 98.1 F (36.7 C) (Oral)   Resp 16   Wt 74.8 kg   SpO2 97%   BMI 25.07 kg/m   Physical Exam Vitals reviewed.  Neurological:     Mental Status: [name].     Procedures  Procedures  ED Course / MDM   Clinical Course as of 06/03/22 2050  Tue Jun 03, 2022  0708 CT Angio Chest/Abd/Pel for Dissection W and/or Wo Contrast 1. No evidence of pulmonary embolism. 2. Small to moderate sized pericardial effusion, increased in size compared to the prior study. 3. No evidence of an acute or active process within the abdomen or pelvis. 4. Atrophic kidneys with stable bilateral renal cysts. No follow-up imaging is recommended. This recommendation follows ACR consensus guidelines: Management of the Incidental Renal Mass on CT: A White Paper of the ACR Incidental Findings Committee. J Am Coll Radiol [phone]. 5. No evidence of aneurysmal dilatation, dissection, major vessel occlusion or hemodynamically significant stenosis involving the thoracic aorta, abdominal aorta and arterial structures within the abdomen and pelvis. 6. Markedly limited evaluation of poorly distended large and small bowel loops, as described above. Follow-up abdomen and pelvis CT following oral contrast administration is recommended if an acute process within the abdomen or pelvis remains of clinical concern.   [HN]  0709 Potassium(!): 5.7 [HN]  0709 Sodium: 135 [HN]  0709 Troponin I (High Sensitivity)(!): 29 27-29, flat [HN]  0709 Lipase(!): 81 No pancreatitis [HN]  0907 Pt states the maalox did not help at all, maybe made it worse [HN]  0932 Extensive chart review of [facility] notes as well as those from [facility] and [facility] did not demonstrate any history of pancreatitis or elevated lipase. [HN]  [id number] Patient is consulted to nephrology who will dialyze him this afternoon.  Have ordered an echocardiogram also to evaluate the pericardial effusion.   Patient electrolytes are not grossly abnormal and I believe that he will likely improve with dialysis.  Do not believe patient requires admission at this time.  I think he can be reevaluated after dialysis and the echo results [HN]  0939 . [HN]  [id number] Patient currently at dialysis. Patient is signed out to the oncoming ED physician who is made aware of her history, presentation, exam, workup, and plan.  Plan is to reevaluate patient after dialysis.  [HN]  2043 Pulse Rate: 91 [LS]    Clinical Course User Index [HN] Audley Hose, MD [LS] Fransico Meadow, PA-C   Medical Decision Making Amount and/or Complexity of Data Reviewed Labs:  Decision-making details documented in ED Course. Radiology:  Decision-making details documented in ED Course.  Risk OTC drugs. Prescription drug management.   Patient seen in ED earlier and went to dialysis Plan is for discharge after Patient reevaluated after dialysis.  He is complaining of upper abdominal pain.  I have reviewed his labs and lipase is elevated at 81 CT does not show any definite acute abnormality although pericardial effusion is noted Patient did have echocardiogram to evaluate his pericardial effusion Given ongoing abdominal pain, family practice asked to evaluate.  They have seen and will admit for further evaluation and treatment      Pattricia Boss, MD 06/03/22 2118

## 2022-06-03 NOTE — Assessment & Plan Note (Signed)
Still elevated, abm pain may be contributing. - Cont home Amlodipine 10 mg, Coreg 25 mg hydralazine 100 mg TID - Pain control as above

## 2022-06-04 DIAGNOSIS — Z992 Dependence on renal dialysis: Secondary | ICD-10-CM | POA: Diagnosis not present

## 2022-06-04 DIAGNOSIS — I10 Essential (primary) hypertension: Secondary | ICD-10-CM | POA: Diagnosis not present

## 2022-06-04 DIAGNOSIS — N186 End stage renal disease: Secondary | ICD-10-CM | POA: Diagnosis not present

## 2022-06-04 DIAGNOSIS — R1013 Epigastric pain: Secondary | ICD-10-CM | POA: Diagnosis not present

## 2022-06-04 DIAGNOSIS — R1310 Dysphagia, unspecified: Secondary | ICD-10-CM | POA: Diagnosis not present

## 2022-06-04 LAB — RENAL FUNCTION PANEL
Albumin: 3.6 g/dL (ref 3.5–5.0)
Anion gap: 19 — ABNORMAL HIGH (ref 5–15)
BUN: 67 mg/dL — ABNORMAL HIGH (ref 6–20)
CO2: 22 mmol/L (ref 22–32)
Calcium: 8.9 mg/dL (ref 8.9–10.3)
Chloride: 94 mmol/L — ABNORMAL LOW (ref 98–111)
Creatinine, Ser: 11.96 mg/dL — ABNORMAL HIGH (ref 0.61–1.24)
GFR, Estimated: 5 mL/min — ABNORMAL LOW (ref >60)
Glucose, Bld: 68 mg/dL — ABNORMAL LOW (ref 70–99)
Phosphorus: 7.2 mg/dL — ABNORMAL HIGH (ref 2.5–4.6)
Potassium: 4.2 mmol/L (ref 3.5–5.1)
Sodium: 135 mmol/L (ref 135–145)

## 2022-06-04 LAB — CBC
HCT: 41.6 % (ref 39.0–52.0)
Hemoglobin: 13.6 g/dL (ref 13.0–17.0)
MCH: 29.8 pg (ref 26.0–34.0)
MCHC: 32.7 g/dL (ref 30.0–36.0)
MCV: 91 fL (ref 80.0–100.0)
Platelets: 172 10*3/uL (ref 150–400)
RBC: 4.57 MIL/uL (ref 4.22–5.81)
RDW: 14.6 % (ref 11.5–15.5)
WBC: 3.1 10*3/uL — ABNORMAL LOW (ref 4.0–10.5)
nRBC: 0 % (ref 0.0–0.2)

## 2022-06-04 LAB — GLUCOSE, CAPILLARY: Glucose-Capillary: 85 mg/dL (ref 70–99)

## 2022-06-04 LAB — TROPONIN I (HIGH SENSITIVITY)
Troponin I (High Sensitivity): 27 ng/L — ABNORMAL HIGH (ref <18)
Troponin I (High Sensitivity): 25 ng/L — ABNORMAL HIGH (ref <18)

## 2022-06-04 LAB — MAGNESIUM: Magnesium: 2.3 mg/dL (ref 1.7–2.4)

## 2022-06-04 MED ORDER — CHLORHEXIDINE GLUCONATE CLOTH 2 % EX PADS
6.0000 | MEDICATED_PAD | Freq: Every day | CUTANEOUS | Status: DC
  Administered 2022-06-05 – 2022-06-06 (×2): 6 via TOPICAL

## 2022-06-04 MED ORDER — ONDANSETRON HCL 4 MG/2ML IJ SOLN
4.0000 mg | Freq: Once | INTRAMUSCULAR | Status: AC
  Administered 2022-06-04: 4 mg via INTRAVENOUS
  Filled 2022-06-04: qty 2

## 2022-06-04 MED ORDER — PROCHLORPERAZINE EDISYLATE 10 MG/2ML IJ SOLN: 10.0000 mg | Freq: Once | INTRAMUSCULAR | Status: DC

## 2022-06-04 MED ORDER — CALCIUM ACETATE (PHOS BINDER) 667 MG PO CAPS
667.0000 mg | ORAL_CAPSULE | Freq: Three times a day (TID) | ORAL | Status: DC
  Administered 2022-06-05 – 2022-06-06 (×2): 667 mg via ORAL
  Filled 2022-06-04 (×4): qty 1

## 2022-06-04 MED ORDER — CALCITRIOL 0.25 MCG PO CAPS
0.2500 ug | ORAL_CAPSULE | Freq: Every day | ORAL | Status: DC
  Filled 2022-06-04 (×2): qty 1

## 2022-06-04 MED ORDER — PANTOPRAZOLE SODIUM 40 MG PO TBEC
40.0000 mg | DELAYED_RELEASE_TABLET | Freq: Two times a day (BID) | ORAL | Status: DC
  Administered 2022-06-04 – 2022-06-05 (×3): 40 mg via ORAL
  Filled 2022-06-04 (×5): qty 1

## 2022-06-04 MED ORDER — HYDROXYZINE HCL 10 MG PO TABS
10.0000 mg | ORAL_TABLET | Freq: Three times a day (TID) | ORAL | Status: DC | PRN
  Administered 2022-06-04 – 2022-06-05 (×4): 10 mg via ORAL
  Filled 2022-06-04 (×4): qty 1

## 2022-06-04 MED ORDER — SUMATRIPTAN SUCCINATE 6 MG/0.5ML ~~LOC~~ SOLN
6.0000 mg | Freq: Once | SUBCUTANEOUS | Status: AC
  Administered 2022-06-04: 6 mg via SUBCUTANEOUS
  Filled 2022-06-04: qty 0.5

## 2022-06-04 MED ORDER — PROCHLORPERAZINE EDISYLATE 10 MG/2ML IJ SOLN
10.0000 mg | Freq: Once | INTRAMUSCULAR | Status: DC
  Administered 2022-06-04: 10 mg via INTRAVENOUS
  Filled 2022-06-04: qty 2

## 2022-06-04 NOTE — Progress Notes (Shared)
     Daily Progress Note Intern Pager: [phone]  Patient name: [name] Medical record number: [id number] Date of birth: 08/26/1985 Age: 37 y.o. Gender: male  Primary Care Provider: [name], DO Consultants: Nephrology, GI Code Status: Full  Pt Overview and Major Events to Date:  1/23 Admitted   Assessment and Plan: EM is a 37 yo M admitted for abdominal pain and N/V. Plan for EGD Friday w/ GI.   PMHx pertinent for Bipolar 1, ESRD on dialysis, HTN, HFrEF, Alport syndrome, hearing loss  * Epigastric abdominal pain - Plan for EGD w/ GI tomorrow. NPO @ [facility]. - GI consulted, appreciate recs - IV protonix BID - Compazine prn - Sucralfate BID Pain control  -Tylenol 650 mg scheduled Q6H  -Dilaudid 0.5-1 mg Q3H PRN breakthrough pain - MiraLAX, senna ordered daily while on narcotics  Hypertension Still elevated, abm pain and migraine may be contributing. Already maxed on current meds, can consider adding clonidine if remaining uncontrolled. - Cont home Amlodipine 10 mg, Coreg 25 mg hydralazine 100 mg TID - Pain control as above  ESRD (end stage renal disease) (HCC) HD TTS.  - Nephrology consulted, appreciate recommendations - Continue dialysis schedule while hospitalized - Avoid morphine for pain control - avoid nephrotoxic agents  Migraines Had severe migraine yesterday (HA, photosensitivity, tearing, N/V). Similar severe HA's noted at prior admissions. Per pt, he has these headaches near daily and especially after dialysis days. Triptan yesterday was helpful. Tylenol has not been helpful in the past. Plan to start depakoate for migraine ppx. - Start Depakoat ER 500 daily - Sumitriptan prn for HA - Compazine prn for N/V   FEN/GI: Renal diet. NPO @ [facility] for EGD tomorrow. PPx: Heparin SQ Dispo:Home in 2-3 days. Barriers include need for EGD.   Subjective:  Reports having daily migraine headaches, especially after dialysis days. Associated with pain in his  neck, photosensitivity. Triptan was helpful yesterday.   Objective: Temp:  [97.3 F (36.3 C)-98.1 F (36.7 C)] 97.5 F (36.4 C) (01/25 1208) Pulse Rate:  [76-92] 92 (01/25 1208) Resp:  [10-18] 18 (01/25 1208) BP: (146-182)/(88-117) 158/97 (01/25 1208) SpO2:  [94 %-100 %] 95 % (01/25 1208) Weight:  [71.1 kg-73.5 kg] 71.1 kg (01/25 1208) Physical Exam: General: Friendly, alert, sitting up in bed in dialysis. NAD. Cardiovascular: RRR Respiratory: CTAB, no crackles or wheezing. Normal WOB on RA. Abdomen: Soft, nontender, nondistended. Normal BS.  Laboratory: Most recent CBC Lab Results  Component Value Date   WBC 2.9 (L) 06/05/2022   HGB 13.4 06/05/2022   HCT 41.0 06/05/2022   MCV 91.1 06/05/2022   PLT 148 (L) 06/05/2022   Most recent BMP    Latest Ref Rng & Units 06/05/2022   12:22 AM  BMP  Glucose 70 - 99 mg/dL 145   BUN 6 - 20 mg/dL 80   Creatinine 0.61 - 1.24 mg/dL 14.43   Sodium 135 - 145 mmol/L 130   Potassium 3.5 - 5.1 mmol/L 5.5   Chloride 98 - 111 mmol/L 94   CO2 22 - 32 mmol/L 23   Calcium 8.9 - 10.3 mg/dL 8.6    Arlyce Dice, MD 06/05/2022, 1:25 PM  PGY-1, [place] Intern pager: [phone], text pages welcome Secure chat group [place]

## 2022-06-04 NOTE — Plan of Care (Signed)
Attempted to give ordered compazine and was unable to find on mar once at bedside.  Apparently ordered at 1822 and discontinued at [place].  Med wasted.  Pt still with c/o headache. Sreece, RN

## 2022-06-04 NOTE — Progress Notes (Signed)
     Daily Progress Note Intern Pager: [phone]  Patient name: [name] Medical record number: [id number] Date of birth: November 17, 1985 Age: 37 y.o. Gender: male  Primary Care Provider: [name], DO Consultants: GI Code Status: Full  Pt Overview and Major Events to Date:  1/23 Admitted  Assessment and Plan: EM is a 37 yo M admitted for abdominal pain and N/V. Plan for EGD Friday w/ GI.  PMHx pertinent for Bipolar 1, ESRD on dialysis, HTN, HFrEF, Alport syndrome, hearing loss   * Epigastric abdominal pain Current leading differential is PUD/h pylori given RUQ location, pain improving w/ eating, worsening on empty stomach. Low c/f pancreatitis (normal lipase, pain improving w/ eating), SBO (passing gas, stool, eating). Is having some N/V, but able to tolerate breakfast w/o vomiting. - GI consulted, planning for EGD Friday 1/26. NPO @ [facility] (order placed) - IV protonix 40 BID - Zofran prn - Sucralfate BID Pain control  -Tylenol 650 mg scheduled Q6H  -Dilaudid 0.5-1 mg Q3H PRN breakthrough pain - MiraLAX, senna ordered daily while on narcotics - Consider IVF if not tolerating PO  Hypertension Still elevated, abm pain may be contributing. - Cont home Amlodipine 10 mg, Coreg 25 mg hydralazine 100 mg TID - Pain control as above  ESRD (end stage renal disease) ([place]) HD TTS.  - Nephrology consulted, appreciate recommendations - Continue dialysis schedule while hospitalized - Avoid morphine for pain control - avoid nephrotoxic agents   FEN/GI: Renal diet PPx: Heparin SQ Dispo:Home pending clinical improvement . Barriers include pending EGD.   Subjective:  Reports that his abm pain improves w/ eating, then returns even more painful after the food is digested and moves out of his stomach. Reports still having BM, passing gas, tolerating PO. He is having N/V but is still able to keep breakfast down and has a good appetite. Reports some occasional bloody streaks when  wiping, but no melena. No blood in vomit that he notes. He says he sometimes burps and tastes like blood.   Objective: Temp:  [97.6 F (36.4 C)-99 F (37.2 C)] 99 F (37.2 C) (01/24 0411) Pulse Rate:  [78-97] 86 (01/24 0954) Resp:  [16-24] 17 (01/24 0954) BP: (158-194)/(94-123) 158/96 (01/24 0954) SpO2:  [95 %-99 %] 95 % (01/24 0954) Weight:  [74.8 kg-75.1 kg] 75.1 kg (01/24 0500) Physical Exam: General: Pleasant, alert. Hard of hearing. NAD. Cardiovascular: RRR Respiratory: CTAB. Normal WOB on RA. Abdomen: Tender in RUQ. Normal BS. Nondistended, soft.   Laboratory: Most recent CBC Lab Results  Component Value Date   WBC 3.1 (L) 06/04/2022   HGB 13.6 06/04/2022   HCT 41.6 06/04/2022   MCV 91.0 06/04/2022   PLT 172 06/04/2022   Most recent BMP    Latest Ref Rng & Units 06/04/2022   12:18 AM  BMP  Glucose 70 - 99 mg/dL 68   BUN 6 - 20 mg/dL 67   Creatinine 0.61 - 1.24 mg/dL 11.96   Sodium 135 - 145 mmol/L 135   Potassium 3.5 - 5.1 mmol/L 4.2   Chloride 98 - 111 mmol/L 94   CO2 22 - 32 mmol/L 22   Calcium 8.9 - 10.3 mg/dL 8.9     Arlyce Dice, MD 06/04/2022, 4:34 PM  PGY-1, [place] Intern pager: [phone], text pages welcome Secure chat group [place]

## 2022-06-04 NOTE — H&P (View-Only) (Signed)
Consultation  Referring Provider:   [facility] Primary Care Physician:  [name], DO Primary Gastroenterologist:  Dr. Loletha Carrow       Reason for Consultation:    epigastric pain, dysphagia   Impression    Epigastric pain with radiation to chest with associated dysphagia Has been on Protonix once daily, no NSAIDs or alcohol.   Has started smoking again has been under extra stress due to separation CT without evidence of esophagitis, duodenitis. 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  Will plan on EGD to evaluate for esophagitis, peptic ulcer disease, H. pylori. -Plan for EGD Friday. I thoroughly discussed the procedure to include nature, alternatives, benefits, and risks including but not limited to bleeding, perforation, infection, anesthesia/cardiac and pulmonary complications. Patient provides understanding and gave verbal consent to proceed. -IV Protonix BID dosing -Can have renal diet today and tomorrow, dialysis planned for tomorrow, will plan for EGD Friday.  - NPO midnight Thursday evening --Continue to monitor H&H with transfusion as needed to  maintain hemoglobin greater than 7. --If EGD is negative then will consider HIDA versus functional dyspepsia with stress - advised to smoke smoking   Thank you for your kind consultation, we will continue to follow.         HPI:   [name] is a 37 y.o. 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 Discussed that overall clinical picture was consistent with IBS and self-limited benign anal bleeding. Biopsies showed fragments of large bowel mucosa with no significant inflammation.  04/14/2022 admission for rectal bleeding with anemia, subsequent flex sigmoidoscopy with Dr. Silverio Decamp showed internal and external hemorrhoids otherwise unremarkable 04/15/2022 Hgb at admission 9.5, MCV 89.4  06/03/2022 patient went to the ER with epigastric abdominal pain for 2 days, some left upper quadrant pain. ER labs show WBC 3.8, Hgb 14.3, MCV 92.6, platelets 179.  Potassium 5.7, BUN 118, CR 16.85, total protein 6.4, lipase 81, magnesium 3.0, troponin 29 ( 02/2022 elevated troponin up to 500, thought to be due to uncontrolled HTN) CT angio chest abdomen pelvis shows contracted gallbladder but no stones, no biliary dilatation, unremarkable pancreas, normal spleen.  Atrophic kidneys, normal stomach, small bowel loops seen throughout abdomen and pelvis poorly distended, stool seen throughout ascending and transverse colon, no bowel dilatation. RUQ US unremarkable no gallstones, no wall thickening, normal CBD  Patient lying in bed, no family at bedside. Patient hard of hearing but can read lips well. Patient states he is feeling well right  now but has had headache and feeling generally weak. Patient states starting last Friday started having  intermittent abdominal pain during dialysis for like a pulling.  States can have some radiation to his chest denies any radiation to his back. States it was similar to a pancreatitis he had years ago. States for the last month or 2 he has been having some chest discomfort and feel "like I am suffocating" when I am eating will have to drink water in order for food to go down. States with epigastric pain feels better when he has food and drink on his stomach and is worse without food. Patient denies GERD, has been on pantoprazole for several years and takes daily.  Has had some burping. Patient has had alternating diarrhea and formed stools which she has had for a long time, still has occasional bright red blood on toilet paper and can have irritation if he does have loose stools.  Denies any melena. No fever, chills, states he has not had any weight loss that he knows of but gets measured at dialysis. Normally gets dialysis Tuesday, Thursday, Saturday. Patient denies NSAID use, has not had any alcohol in over 20 years, does admit to smoking Black and milds again in the last 2 to 3 months due to increased stress from separation. He reports that he is currently going through a separation with his wife and this is a great source of stress for him. He notes occasional discomfort and anxiety when thinking about his situation   Abnormal ED labs: Abnormal Labs Reviewed  COMPREHENSIVE METABOLIC PANEL - Abnormal; Notable for the following components:      Result Value   Potassium 5.7 (*)    Chloride 94 (*)    BUN 118 (*)    Creatinine, Ser 16.85 (*)    Total Protein 6.4 (*)    GFR, Estimated 3 (*)    Anion gap 19 (*)    All other components within normal limits  CBC WITH DIFFERENTIAL/PLATELET - Abnormal; Notable for the following components:   WBC 3.8 (*)    All other components within normal limits  LIPASE, BLOOD - Abnormal; Notable for the following components:   Lipase 81 (*)    All other  components within normal limits  MAGNESIUM - Abnormal; Notable for the following components:   Magnesium 3.0 (*)    All other components within normal limits  CBC - Abnormal; Notable for the following components:   WBC 3.1 (*)    All other components within normal limits  RENAL FUNCTION PANEL - Abnormal; Notable for the following components:   Chloride 94 (*)    Glucose, Bld 68 (*)    BUN 67 (*)    Creatinine, Ser 11.96 (*)    Phosphorus 7.2 (*)    GFR, Estimated 5 (*)    Anion gap 19 (*)    All other components within normal limits  TROPONIN I (HIGH SENSITIVITY) - Abnormal; Notable for the following components:   Troponin I (High Sensitivity) 27 (*)    All other components within normal limits  TROPONIN I (HIGH SENSITIVITY) - Abnormal; Notable for the following components:   Troponin I (High Sensitivity) 29 (*)    All other components within normal limits     Past Medical History:  Diagnosis Date   Bipolar 1 disorder (HCC)    CKD (chronic kidney disease)    on dialysis   Depression    GERD (gastroesophageal reflux disease)    Hearing  difficulty of left ear    75% hearing   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   Hypertension    Low blood sugar    Renal disorder     Surgical History:  He  has a past surgical history that includes Appendectomy; spinal tap; Wisdom tooth extraction; AV fistula placement (Left, 03/03/2019); Ligation of arteriovenous  fistula (Left, 03/06/2019); Bascilic vein transposition (Right, 04/12/2019); Spine surgery; Colonoscopy with propofol (N/A, 10/14/2021); biopsy (10/14/2021); and Flexible sigmoidoscopy (N/A, 04/14/2022). Family History:  His family history includes Asthma in his son; Diabetes in his mother; Hearing loss in his father; Heart disease in his maternal grandmother; Hypertension in his mother and sister; Kidney failure in his mother; Stroke in his maternal grandfather. Social History:   reports that he has been smoking cigarettes. He  has been smoking an average of .3 packs per day. He has never used smokeless tobacco. He reports that he does not currently use alcohol. He reports current drug use. Drug: Marijuana.  Prior to Admission medications   Medication Sig Start Date End Date Taking? Authorizing Provider  acetaminophen (TYLENOL) 325 MG tablet Take 2 tablets (650 mg total) by mouth every 6 (six) hours as needed for mild pain (or Fever >/= 101). 04/14/22   Lowry Ram, MD  amLODipine (NORVASC) 10 MG tablet TAKE 1 TABLET BY MOUTH EVERY DAY 04/10/22   [name], DO  calcitRIOL (ROCALTROL) 0.25 MCG capsule Take 0.25 mcg by mouth daily.    [provider]  calcium acetate (PHOSLO) 667 MG capsule Take 1 capsule (667 mg total) by mouth 3 (three) times daily with meals. 01/04/22   August Albino, MD  carvedilol (COREG) 25 MG tablet TAKE 1 TABLET BY MOUTH TWICE A DAY 05/14/22   [name], DO  hydrALAZINE (APRESOLINE) 100 MG tablet Take 1 tablet (100 mg total) by mouth 3 (three) times daily. 05/20/22   Marylu Lund., NP  hydrocortisone-pramoxine [facility]) 2.5-1 % rectal cream Place rectally 2 (two) times daily. 04/14/22   Lowry Ram, MD  isosorbide mononitrate (IMDUR) 30 MG 24 hr tablet Take 1 tablet (30 mg total) by mouth daily. 05/21/22   Marylu Lund., NP  neomycin-polymyxin-hydrocortisone (CORTISPORIN) OTIC solution Place 3 drops into both ears 4 (four) times daily. For 7 days. Patient not taking: Reported on 05/30/2022 04/08/22   Salvadore Oxford, MD  nicotine polacrilex (NICORETTE) 4 MG gum Take 1 each (4 mg total) by mouth as needed for smoking cessation. Patient not taking: Reported on 05/09/2022 05/01/22   [name], DO  pantoprazole (PROTONIX) 40 MG tablet Take 40 mg by mouth daily.    [provider]  polycarbophil (FIBERCON) 625 MG tablet Take 1 tablet (625 mg total) by mouth daily. Patient not taking: Reported on 05/30/2022 04/17/22   [name], DO  psyllium  (HYDROCIL/METAMUCIL) 95 % PACK Take 1 packet by mouth 3 (three) times daily. Patient not taking: Reported on 05/30/2022 04/17/22   [name], DO  triamcinolone ointment (KENALOG) 0.1 % Apply to scrotum twice daily for itching. Do not use for more than 2 weeks continuously 05/30/22   [name], DO    Current Facility-Administered Medications  Medication Dose Route Frequency Provider Last Rate Last Admin   acetaminophen (TYLENOL) tablet 650 mg  650 mg Oral Q6H Darci Current, DO   650 mg at 06/04/22 0946   amLODipine (NORVASC) tablet 10 mg  10 mg Oral Daily Darci Current, DO   10 mg at  06/04/22 0948   carvedilol (COREG) tablet 25 mg  25 mg Oral BID Ganta, Anupa, DO   25 mg at 06/04/22 0948   Chlorhexidine Gluconate Cloth 2 % PADS 6 each  6 each Topical Q0600 Darci Current, DO   6 each at 06/04/22 0617   heparin injection 1,200 Units  1,200 Units Dialysis PRN Darci Current, DO       heparin injection 5,000 Units  5,000 Units Subcutaneous Q8H Darci Current, DO   5,000 Units at 06/04/22 5400   hydrALAZINE (APRESOLINE) tablet 100 mg  100 mg Oral TID Donney Dice, DO   100 mg at 06/04/22 0948   HYDROmorphone (DILAUDID) injection 0.5-1 mg  0.5-1 mg Intravenous Q3H PRN Darci Current, DO   0.5 mg at 06/04/22 0446   isosorbide mononitrate (IMDUR) 24 hr tablet 30 mg  30 mg Oral Daily Darci Current, DO   30 mg at 06/04/22 0948   ondansetron (ZOFRAN) tablet 4 mg  4 mg Oral Q6H PRN Darci Current, DO   4 mg at 06/04/22 8676   Or   ondansetron (ZOFRAN) injection 4 mg  4 mg Intravenous Q6H PRN Darci Current, DO       pantoprazole (PROTONIX) EC tablet 40 mg  40 mg Oral BID August Albino, MD       polyethylene glycol (MIRALAX / GLYCOLAX) packet 17 g  17 g Oral Daily Ganta, Anupa, DO       sucralfate (CARAFATE) tablet 1 g  1 g Oral BID Ganta, Anupa, DO   1 g at 06/04/22 0948    Allergies as of 06/03/2022 - Review Complete 06/03/2022  Allergen Reaction Noted   Zestril [lisinopril] Swelling 06/13/2021    Nsaids Other (See Comments) 05/20/2014   Risperdal [risperidone] Other (See Comments) 06/13/2021    Review of Systems:    Constitutional: No weight loss, fever, chills, weakness or fatigue HEENT: Eyes: No change in vision               Ears, Nose, Throat:  No change in hearing or congestion Skin: No rash or itching Cardiovascular: No chest pain, chest pressure or palpitations   Respiratory: No SOB or cough Gastrointestinal: See HPI and otherwise negative Genitourinary: No dysuria or change in urinary frequency Neurological: No headache, dizziness or syncope Musculoskeletal: No new muscle or joint pain Hematologic: No bleeding or bruising Psychiatric: No history of depression or anxiety     Physical Exam:  Vital signs in last 24 hours: Temp:  [97.6 F (36.4 C)-99 F (37.2 C)] 99 F (37.2 C) (01/24 0411) Pulse Rate:  [78-98] 86 (01/24 0954) Resp:  [16-24] 17 (01/24 0954) BP: (158-194)/(94-123) 158/96 (01/24 0954) SpO2:  [95 %-99 %] 95 % (01/24 0954) Weight:  [74.8 kg-78.6 kg] 75.1 kg (01/24 0500)   Last BM recorded by nurses in past 5 days No data recorded  General:   Pleasant, HOH, male in no acute distress Head:  Normocephalic and atraumatic.  Dry mucous membranes Eyes: sclerae anicteric,conjunctive pink  Heart:  regular rate and rhythm Pulm: Clear anteriorly; no wheezing Abdomen:  Soft, Non-distended AB, Active bowel sounds. mild- mod tenderness in the epigastrium and RUQ, neg murphy. Without guarding and Without rebound, No organomegaly appreciated. Extremities:  Without edema. Msk:  Symmetrical without gross deformities. Peripheral pulses intact.  Neurologic:  Alert and  oriented x4;  No focal deficits.  Skin:   Dry and intact without significant lesions or rashes. Psychiatric:  Cooperative. Normal mood and affect.  LAB RESULTS: Recent  Labs    06/03/22 0231 06/04/22 0018  WBC 3.8* 3.1*  HGB 14.3 13.6  HCT 44.0 41.6  PLT 179 172   BMET Recent Labs     06/03/22 0231 06/04/22 0018  NA 135 135  K 5.7* 4.2  CL 94* 94*  CO2 22 22  GLUCOSE 83 68*  BUN 118* 67*  CREATININE 16.85* 11.96*  CALCIUM 9.0 8.9   LFT Recent Labs    06/03/22 0231 06/04/22 0018  PROT 6.4*  --   ALBUMIN 3.9 3.6  AST 30  --   ALT 30  --   ALKPHOS 101  --   BILITOT 0.6  --    PT/INR No results for input(s): "LABPROT", "INR" in the last 72 hours.  STUDIES: US Abdomen Limited RUQ (LIVER/GB)  Result Date: 06/03/2022 CLINICAL DATA:  Abdominal pain. EXAM: ULTRASOUND ABDOMEN LIMITED RIGHT UPPER QUADRANT COMPARISON:  None Available. FINDINGS: Gallbladder: No gallstones or wall thickening visualized. No sonographic Murphy sign noted by sonographer. Common bile duct: Diameter: 4 mm Liver: No focal lesion identified. Within normal limits in parenchymal echogenicity. Portal vein is patent on color Doppler imaging with normal direction of blood flow towards the liver. Other: None. IMPRESSION: Unremarkable right upper quadrant ultrasound. Electronically Signed   By: Anner Crete M.D.   On: 06/03/2022 22:44   ECHOCARDIOGRAM COMPLETE  Result Date: 06/03/2022    ECHOCARDIOGRAM REPORT   Patient Name:   [name] [facility] Date of Exam: 06/03/2022 Medical Rec #:  [id number]       Height:       68.0 in Accession #:    [id number]      Weight:       164.0 lb Date of Birth:  Dec 27, 1985       BSA:          1.878 m Patient Age:    36 years        BP:           166/108 mmHg Patient Gender: M               HR:           79 bpm. Exam Location:  Inpatient Procedure: 2D Echo, Cardiac Doppler and Color Doppler STAT ECHO Indications:    Pericardial effusion I31.3  History:        Patient has prior history of Echocardiogram examinations, most                 recent 03/03/2022. Risk Factors:Hypertension. Chronic kidney                 disease.  Sonographer:    Darlina Sicilian RDCS Referring Phys: [id number] HAYLEY N NAASZ  Sonographer Comments: Imaging in supine position due to pain. IMPRESSIONS  1.  Compared to previous echo, LVEF is improved . Left ventricular ejection fraction, by estimation, is 55%. The left ventricle has normal function. The left ventricle has no regional wall motion abnormalities. There is moderate concentric left ventricular hypertrophy. Left ventricular diastolic parameters were normal.  2. Right ventricular systolic function is normal. The right ventricular size is normal.  3. Compared to echo from October 2023, pericardial effusion is a little less prominence.Marland Kitchen a small pericardial effusion is present. The pericardial effusion is circumferential.  4. The mitral valve is abnormal. Trivial mitral valve regurgitation. No evidence of mitral stenosis.  5. The aortic valve is tricuspid. Aortic valve regurgitation is not visualized. No aortic stenosis is present.  6. The inferior  vena cava is dilated in size with <50% respiratory variability, suggesting right atrial pressure of 15 mmHg. FINDINGS  Left Ventricle: Compared to previous echo, LVEF is improved. Left ventricular ejection fraction, by estimation, is 55%. The left ventricle has normal function. The left ventricle has no regional wall motion abnormalities. The left ventricular internal cavity size was normal in size. There is moderate concentric left ventricular hypertrophy. Left ventricular diastolic parameters were normal. Right Ventricle: The right ventricular size is normal. No increase in right ventricular wall thickness. Right ventricular systolic function is normal. Left Atrium: Left atrial size was normal in size. Right Atrium: Right atrial size was normal in size. Pericardium: Compared to echo from October 2023, pericardial effusion is a little less prominence. A small pericardial effusion is present. The pericardial effusion is circumferential. Mitral Valve: The mitral valve is abnormal. Trivial mitral valve regurgitation. No evidence of mitral valve stenosis. Tricuspid Valve: The tricuspid valve is grossly normal. Tricuspid  valve regurgitation is trivial. No evidence of tricuspid stenosis. Aortic Valve: The aortic valve is tricuspid. Aortic valve regurgitation is not visualized. No aortic stenosis is present. Pulmonic Valve: The pulmonic valve was grossly normal. Pulmonic valve regurgitation is not visualized. No evidence of pulmonic stenosis. Aorta: The aortic root and ascending aorta are structurally normal, with no evidence of dilitation. Venous: The inferior vena cava is dilated in size with less than 50% respiratory variability, suggesting right atrial pressure of 15 mmHg. IAS/Shunts: No atrial level shunt detected by color flow Doppler.  LEFT VENTRICLE PLAX 2D LVIDd:         5.00 cm   Diastology LVIDs:         3.60 cm   LV e' medial:    7.62 cm/s LV PW:         1.50 cm   LV E/e' medial:  14.8 LV IVS:        1.40 cm   LV e' lateral:   9.99 cm/s LVOT diam:     2.00 cm   LV E/e' lateral: 11.3 LV SV:         78 LV SV Index:   42 LVOT Area:     3.14 cm  RIGHT VENTRICLE RV S prime:     14.80 cm/s TAPSE (M-mode): 2.5 cm LEFT ATRIUM             Index        RIGHT ATRIUM           Index LA diam:        3.80 cm 2.02 cm/m   RA Area:     13.00 cm LA Vol (A2C):   62.2 ml 33.11 ml/m  RA Volume:   30.30 ml  16.13 ml/m LA Vol (A4C):   55.3 ml 29.44 ml/m LA Biplane Vol: 59.4 ml 31.62 ml/m  AORTIC VALVE LVOT Vmax:   130.00 cm/s LVOT Vmean:  88.700 cm/s LVOT VTI:    0.249 m  AORTA Ao Root diam: 3.10 cm Ao Asc diam:  3.10 cm MITRAL VALVE MV Area (PHT): 4.17 cm     SHUNTS MV Decel Time: 182 msec     Systemic VTI:  0.25 m MV E velocity: 113.00 cm/s  Systemic Diam: 2.00 cm MV A velocity: 51.90 cm/s MV E/A ratio:  2.18 Dorris Carnes MD Electronically signed by Dorris Carnes MD Signature Date/Time: 06/03/2022/1:22:51 PM    Final    CT Angio Chest/Abd/Pel for Dissection W and/or Wo Contrast  Result Date: 06/03/2022 CLINICAL DATA:  Sudden epigastric pain. EXAM: CT ANGIOGRAPHY CHEST, ABDOMEN AND PELVIS TECHNIQUE: Non-contrast CT of the chest was  initially obtained. Multidetector CT imaging through the chest, abdomen and pelvis was performed using the standard protocol during bolus administration of intravenous contrast. Multiplanar reconstructed images and MIPs were obtained and reviewed to evaluate the vascular anatomy. RADIATION DOSE REDUCTION: This exam was performed according to the departmental dose-optimization program which includes automated exposure control, adjustment of the mA and/or kV according to patient size and/or use of iterative reconstruction technique. CONTRAST:  152mL OMNIPAQUE IOHEXOL 350 MG/ML SOLN COMPARISON:  March 02, 2022, May 18, 2021 and July 16, 2017 FINDINGS: CTA CHEST FINDINGS Cardiovascular: The thoracic aorta is normal in appearance. Satisfactory opacification of the pulmonary arteries to the segmental level. No evidence of pulmonary embolism. Normal heart size. A 1.2 cm thick pericardial effusion is seen (approximately 1.18 Hounsfield units). This is most prominent along the posterior and lateral aspect of the left ventricle and is increased in size when compared to the prior study. Mediastinum/Nodes: No enlarged mediastinal, hilar, or axillary lymph nodes. Thyroid gland, trachea, and esophagus demonstrate no significant findings. Lungs/Pleura: There is no evidence of acute infiltrate, pleural effusion or pneumothorax. Musculoskeletal: No chest wall abnormality. No acute or significant osseous findings. Review of the MIP images confirms the above findings. CTA ABDOMEN AND PELVIS FINDINGS VASCULAR Aorta: Normal caliber aorta without aneurysm, dissection, vasculitis or significant stenosis. Celiac: Patent without evidence of aneurysm, dissection, vasculitis or significant stenosis. SMA: Patent without evidence of aneurysm, dissection, vasculitis or significant stenosis. Renals: Both renal arteries are patent without evidence of aneurysm, dissection, vasculitis, fibromuscular dysplasia or significant stenosis. IMA:  Patent without evidence of aneurysm, dissection, vasculitis or significant stenosis. Inflow: Patent without evidence of aneurysm, dissection, vasculitis or significant stenosis. Veins: No obvious venous abnormality within the limitations of this arterial phase study. Review of the MIP images confirms the above findings. NON-VASCULAR Hepatobiliary: No focal liver abnormality is seen. The gallbladder is contracted. No gallstones, gallbladder wall thickening, or biliary dilatation. Pancreas: Unremarkable. No pancreatic ductal dilatation or surrounding inflammatory changes. Spleen: Normal in size without focal abnormality. Adrenals/Urinary Tract: Adrenal glands are unremarkable. The kidneys are atrophic in appearance, without evidence of renal calculi or hydronephrosis. Oval stable bilateral renal cysts are seen. Bladder is unremarkable. Stomach/Bowel: Stomach is within normal limits. The appendix is not clearly identified. The small bowel loops seen throughout the abdomen and pelvis are poorly distended and subsequently limited in evaluation. Stool is seen throughout the ascending and transverse colon. No evidence of bowel dilatation. Lymphatic: Subcentimeter para-aortic and bilateral inguinal lymph nodes are noted. Reproductive: Prostate is unremarkable. Other: No abdominal wall hernia or abnormality. No abdominopelvic ascites. Musculoskeletal: No acute or significant osseous findings. Review of the MIP images confirms the above findings. IMPRESSION: 1. No evidence of pulmonary embolism. 2. Small to moderate sized pericardial effusion, increased in size compared to the prior study. 3. No evidence of an acute or active process within the abdomen or pelvis. 4. Atrophic kidneys with stable bilateral renal cysts. No follow-up imaging is recommended. This recommendation follows ACR consensus guidelines: Management of the Incidental Renal Mass on CT: A White Paper of the ACR Incidental Findings Committee. J Am Coll Radiol  [phone]. 5. No evidence of aneurysmal dilatation, dissection, major vessel occlusion or hemodynamically significant stenosis involving the thoracic aorta, abdominal aorta and arterial structures within the abdomen and pelvis. 6. Markedly limited evaluation of poorly distended large and small bowel loops, as described above. Follow-up abdomen  and pelvis CT following oral contrast administration is recommended if an acute process within the abdomen or pelvis remains of clinical concern. Electronically Signed   By: Virgina Norfolk M.D.   On: 06/03/2022 03:54     Vladimir Crofts  06/04/2022, 1:16 PM   Attending physician's note  I have taken a history, reviewed the chart and examined the patient. I performed a substantive portion of this encounter, including complete performance of at least one of the key components, in conjunction with the APP. I agree with the APP's note, impression and recommendations.    37 year old male with end-stage renal disease on dialysis with complaints of epigastric abdominal pain and retrosternal chest pain Intermittent dysphagia Will plan to proceed with EGD for further evaluation, exclude erosive esophagitis or peptic ulcer disease He gets dialyzed on Thursday, schedule the procedure for Friday N.p.o. after midnight on Thursday Continue PPI twice daily   The patient was provided an opportunity to ask questions and all were answered. The patient agreed with the plan and demonstrated an understanding of the instructions.  Damaris Hippo , MD [phone]

## 2022-06-04 NOTE — Consult Note (Addendum)
Consultation  Referring Provider:   [facility] Primary Care Physician:  [name], DO Primary Gastroenterologist:  Dr. Loletha Carrow       Reason for Consultation:    epigastric pain, dysphagia   Impression    Epigastric pain with radiation to chest with associated dysphagia Has been on Protonix once daily, no NSAIDs or alcohol.   Has started smoking again has been under extra stress due to separation CT without evidence of esophagitis, duodenitis. 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  Will plan on EGD to evaluate for esophagitis, peptic ulcer disease, H. pylori. -Plan for EGD Friday. I thoroughly discussed the procedure to include nature, alternatives, benefits, and risks including but not limited to bleeding, perforation, infection, anesthesia/cardiac and pulmonary complications. Patient provides understanding and gave verbal consent to proceed. -IV Protonix BID dosing -Can have renal diet today and tomorrow, dialysis planned for tomorrow, will plan for EGD Friday.  - NPO midnight Thursday evening --Continue to monitor H&H with transfusion as needed to  maintain hemoglobin greater than 7. --If EGD is negative then will consider HIDA versus functional dyspepsia with stress - advised to smoke smoking   Thank you for your kind consultation, we will continue to follow.         HPI:   [name] is a 37 y.o. 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 Discussed that overall clinical picture was consistent with IBS and self-limited benign anal bleeding. Biopsies showed fragments of large bowel mucosa with no significant inflammation.  04/14/2022 admission for rectal bleeding with anemia, subsequent flex sigmoidoscopy with Dr. Silverio Decamp showed internal and external hemorrhoids otherwise unremarkable 04/15/2022 Hgb at admission 9.5, MCV 89.4  06/03/2022 patient went to the ER with epigastric abdominal pain for 2 days, some left upper quadrant pain. ER labs show WBC 3.8, Hgb 14.3, MCV 92.6, platelets 179.  Potassium 5.7, BUN 118, CR 16.85, total protein 6.4, lipase 81, magnesium 3.0, troponin 29 ( 02/2022 elevated troponin up to 500, thought to be due to uncontrolled HTN) CT angio chest abdomen pelvis shows contracted gallbladder but no stones, no biliary dilatation, unremarkable pancreas, normal spleen.  Atrophic kidneys, normal stomach, small bowel loops seen throughout abdomen and pelvis poorly distended, stool seen throughout ascending and transverse colon, no bowel dilatation. RUQ US unremarkable no gallstones, no wall thickening, normal CBD  Patient lying in bed, no family at bedside. Patient hard of hearing but can read lips well. Patient states he is feeling well right  now but has had headache and feeling generally weak. Patient states starting last Friday started having  intermittent abdominal pain during dialysis for like a pulling.  States can have some radiation to his chest denies any radiation to his back. States it was similar to a pancreatitis he had years ago. States for the last month or 2 he has been having some chest discomfort and feel "like I am suffocating" when I am eating will have to drink water in order for food to go down. States with epigastric pain feels better when he has food and drink on his stomach and is worse without food. Patient denies GERD, has been on pantoprazole for several years and takes daily.  Has had some burping. Patient has had alternating diarrhea and formed stools which she has had for a long time, still has occasional bright red blood on toilet paper and can have irritation if he does have loose stools.  Denies any melena. No fever, chills, states he has not had any weight loss that he knows of but gets measured at dialysis. Normally gets dialysis Tuesday, Thursday, Saturday. Patient denies NSAID use, has not had any alcohol in over 20 years, does admit to smoking Black and milds again in the last 2 to 3 months due to increased stress from separation. He reports that he is currently going through a separation with his wife and this is a great source of stress for him. He notes occasional discomfort and anxiety when thinking about his situation   Abnormal ED labs: Abnormal Labs Reviewed  COMPREHENSIVE METABOLIC PANEL - Abnormal; Notable for the following components:      Result Value   Potassium 5.7 (*)    Chloride 94 (*)    BUN 118 (*)    Creatinine, Ser 16.85 (*)    Total Protein 6.4 (*)    GFR, Estimated 3 (*)    Anion gap 19 (*)    All other components within normal limits  CBC WITH DIFFERENTIAL/PLATELET - Abnormal; Notable for the following components:   WBC 3.8 (*)    All other components within normal limits  LIPASE, BLOOD - Abnormal; Notable for the following components:   Lipase 81 (*)    All other  components within normal limits  MAGNESIUM - Abnormal; Notable for the following components:   Magnesium 3.0 (*)    All other components within normal limits  CBC - Abnormal; Notable for the following components:   WBC 3.1 (*)    All other components within normal limits  RENAL FUNCTION PANEL - Abnormal; Notable for the following components:   Chloride 94 (*)    Glucose, Bld 68 (*)    BUN 67 (*)    Creatinine, Ser 11.96 (*)    Phosphorus 7.2 (*)    GFR, Estimated 5 (*)    Anion gap 19 (*)    All other components within normal limits  TROPONIN I (HIGH SENSITIVITY) - Abnormal; Notable for the following components:   Troponin I (High Sensitivity) 27 (*)    All other components within normal limits  TROPONIN I (HIGH SENSITIVITY) - Abnormal; Notable for the following components:   Troponin I (High Sensitivity) 29 (*)    All other components within normal limits     Past Medical History:  Diagnosis Date   Bipolar 1 disorder (HCC)    CKD (chronic kidney disease)    on dialysis   Depression    GERD (gastroesophageal reflux disease)    Hearing  difficulty of left ear    75% hearing   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   Hypertension    Low blood sugar    Renal disorder     Surgical History:  He  has a past surgical history that includes Appendectomy; spinal tap; Wisdom tooth extraction; AV fistula placement (Left, 03/03/2019); Ligation of arteriovenous  fistula (Left, 03/06/2019); Bascilic vein transposition (Right, 04/12/2019); Spine surgery; Colonoscopy with propofol (N/A, 10/14/2021); biopsy (10/14/2021); and Flexible sigmoidoscopy (N/A, 04/14/2022). Family History:  His family history includes Asthma in his son; Diabetes in his mother; Hearing loss in his father; Heart disease in his maternal grandmother; Hypertension in his mother and sister; Kidney failure in his mother; Stroke in his maternal grandfather. Social History:   reports that he has been smoking cigarettes. He  has been smoking an average of .3 packs per day. He has never used smokeless tobacco. He reports that he does not currently use alcohol. He reports current drug use. Drug: Marijuana.  Prior to Admission medications   Medication Sig Start Date End Date Taking? Authorizing Provider  acetaminophen (TYLENOL) 325 MG tablet Take 2 tablets (650 mg total) by mouth every 6 (six) hours as needed for mild pain (or Fever >/= 101). 04/14/22   Lowry Ram, MD  amLODipine (NORVASC) 10 MG tablet TAKE 1 TABLET BY MOUTH EVERY DAY 04/10/22   [name], DO  calcitRIOL (ROCALTROL) 0.25 MCG capsule Take 0.25 mcg by mouth daily.    [provider]  calcium acetate (PHOSLO) 667 MG capsule Take 1 capsule (667 mg total) by mouth 3 (three) times daily with meals. 01/04/22   August Albino, MD  carvedilol (COREG) 25 MG tablet TAKE 1 TABLET BY MOUTH TWICE A DAY 05/14/22   [name], DO  hydrALAZINE (APRESOLINE) 100 MG tablet Take 1 tablet (100 mg total) by mouth 3 (three) times daily. 05/20/22   Marylu Lund., NP  hydrocortisone-pramoxine [facility]) 2.5-1 % rectal cream Place rectally 2 (two) times daily. 04/14/22   Lowry Ram, MD  isosorbide mononitrate (IMDUR) 30 MG 24 hr tablet Take 1 tablet (30 mg total) by mouth daily. 05/21/22   Marylu Lund., NP  neomycin-polymyxin-hydrocortisone (CORTISPORIN) OTIC solution Place 3 drops into both ears 4 (four) times daily. For 7 days. Patient not taking: Reported on 05/30/2022 04/08/22   Salvadore Oxford, MD  nicotine polacrilex (NICORETTE) 4 MG gum Take 1 each (4 mg total) by mouth as needed for smoking cessation. Patient not taking: Reported on 05/09/2022 05/01/22   [name], DO  pantoprazole (PROTONIX) 40 MG tablet Take 40 mg by mouth daily.    [provider]  polycarbophil (FIBERCON) 625 MG tablet Take 1 tablet (625 mg total) by mouth daily. Patient not taking: Reported on 05/30/2022 04/17/22   [name], DO  psyllium  (HYDROCIL/METAMUCIL) 95 % PACK Take 1 packet by mouth 3 (three) times daily. Patient not taking: Reported on 05/30/2022 04/17/22   [name], DO  triamcinolone ointment (KENALOG) 0.1 % Apply to scrotum twice daily for itching. Do not use for more than 2 weeks continuously 05/30/22   [name], DO    Current Facility-Administered Medications  Medication Dose Route Frequency Provider Last Rate Last Admin   acetaminophen (TYLENOL) tablet 650 mg  650 mg Oral Q6H Darci Current, DO   650 mg at 06/04/22 0946   amLODipine (NORVASC) tablet 10 mg  10 mg Oral Daily Darci Current, DO   10 mg at  06/04/22 0948   carvedilol (COREG) tablet 25 mg  25 mg Oral BID Ganta, Anupa, DO   25 mg at 06/04/22 0948   Chlorhexidine Gluconate Cloth 2 % PADS 6 each  6 each Topical Q0600 Darci Current, DO   6 each at 06/04/22 0617   heparin injection 1,200 Units  1,200 Units Dialysis PRN Darci Current, DO       heparin injection 5,000 Units  5,000 Units Subcutaneous Q8H Darci Current, DO   5,000 Units at 06/04/22 0177   hydrALAZINE (APRESOLINE) tablet 100 mg  100 mg Oral TID Donney Dice, DO   100 mg at 06/04/22 0948   HYDROmorphone (DILAUDID) injection 0.5-1 mg  0.5-1 mg Intravenous Q3H PRN Darci Current, DO   0.5 mg at 06/04/22 0446   isosorbide mononitrate (IMDUR) 24 hr tablet 30 mg  30 mg Oral Daily Darci Current, DO   30 mg at 06/04/22 0948   ondansetron (ZOFRAN) tablet 4 mg  4 mg Oral Q6H PRN Darci Current, DO   4 mg at 06/04/22 9390   Or   ondansetron (ZOFRAN) injection 4 mg  4 mg Intravenous Q6H PRN Darci Current, DO       pantoprazole (PROTONIX) EC tablet 40 mg  40 mg Oral BID August Albino, MD       polyethylene glycol (MIRALAX / GLYCOLAX) packet 17 g  17 g Oral Daily Ganta, Anupa, DO       sucralfate (CARAFATE) tablet 1 g  1 g Oral BID Ganta, Anupa, DO   1 g at 06/04/22 0948    Allergies as of 06/03/2022 - Review Complete 06/03/2022  Allergen Reaction Noted   Zestril [lisinopril] Swelling 06/13/2021    Nsaids Other (See Comments) 05/20/2014   Risperdal [risperidone] Other (See Comments) 06/13/2021    Review of Systems:    Constitutional: No weight loss, fever, chills, weakness or fatigue HEENT: Eyes: No change in vision               Ears, Nose, Throat:  No change in hearing or congestion Skin: No rash or itching Cardiovascular: No chest pain, chest pressure or palpitations   Respiratory: No SOB or cough Gastrointestinal: See HPI and otherwise negative Genitourinary: No dysuria or change in urinary frequency Neurological: No headache, dizziness or syncope Musculoskeletal: No new muscle or joint pain Hematologic: No bleeding or bruising Psychiatric: No history of depression or anxiety     Physical Exam:  Vital signs in last 24 hours: Temp:  [97.6 F (36.4 C)-99 F (37.2 C)] 99 F (37.2 C) (01/24 0411) Pulse Rate:  [78-98] 86 (01/24 0954) Resp:  [16-24] 17 (01/24 0954) BP: (158-194)/(94-123) 158/96 (01/24 0954) SpO2:  [95 %-99 %] 95 % (01/24 0954) Weight:  [74.8 kg-78.6 kg] 75.1 kg (01/24 0500)   Last BM recorded by nurses in past 5 days No data recorded  General:   Pleasant, HOH, male in no acute distress Head:  Normocephalic and atraumatic.  Dry mucous membranes Eyes: sclerae anicteric,conjunctive pink  Heart:  regular rate and rhythm Pulm: Clear anteriorly; no wheezing Abdomen:  Soft, Non-distended AB, Active bowel sounds. mild- mod tenderness in the epigastrium and RUQ, neg murphy. Without guarding and Without rebound, No organomegaly appreciated. Extremities:  Without edema. Msk:  Symmetrical without gross deformities. Peripheral pulses intact.  Neurologic:  Alert and  oriented x4;  No focal deficits.  Skin:   Dry and intact without significant lesions or rashes. Psychiatric:  Cooperative. Normal mood and affect.  LAB RESULTS: Recent  Labs    06/03/22 0231 06/04/22 0018  WBC 3.8* 3.1*  HGB 14.3 13.6  HCT 44.0 41.6  PLT 179 172   BMET Recent Labs     06/03/22 0231 06/04/22 0018  NA 135 135  K 5.7* 4.2  CL 94* 94*  CO2 22 22  GLUCOSE 83 68*  BUN 118* 67*  CREATININE 16.85* 11.96*  CALCIUM 9.0 8.9   LFT Recent Labs    06/03/22 0231 06/04/22 0018  PROT 6.4*  --   ALBUMIN 3.9 3.6  AST 30  --   ALT 30  --   ALKPHOS 101  --   BILITOT 0.6  --    PT/INR No results for input(s): "LABPROT", "INR" in the last 72 hours.  STUDIES: US Abdomen Limited RUQ (LIVER/GB)  Result Date: 06/03/2022 CLINICAL DATA:  Abdominal pain. EXAM: ULTRASOUND ABDOMEN LIMITED RIGHT UPPER QUADRANT COMPARISON:  None Available. FINDINGS: Gallbladder: No gallstones or wall thickening visualized. No sonographic Murphy sign noted by sonographer. Common bile duct: Diameter: 4 mm Liver: No focal lesion identified. Within normal limits in parenchymal echogenicity. Portal vein is patent on color Doppler imaging with normal direction of blood flow towards the liver. Other: None. IMPRESSION: Unremarkable right upper quadrant ultrasound. Electronically Signed   By: Anner Crete M.D.   On: 06/03/2022 22:44   ECHOCARDIOGRAM COMPLETE  Result Date: 06/03/2022    ECHOCARDIOGRAM REPORT   Patient Name:   [name] [facility] Date of Exam: 06/03/2022 Medical Rec #:  [id number]       Height:       68.0 in Accession #:    [id number]      Weight:       164.0 lb Date of Birth:  Apr 15, 1986       BSA:          1.878 m Patient Age:    36 years        BP:           166/108 mmHg Patient Gender: M               HR:           79 bpm. Exam Location:  Inpatient Procedure: 2D Echo, Cardiac Doppler and Color Doppler STAT ECHO Indications:    Pericardial effusion I31.3  History:        Patient has prior history of Echocardiogram examinations, most                 recent 03/03/2022. Risk Factors:Hypertension. Chronic kidney                 disease.  Sonographer:    Darlina Sicilian RDCS Referring Phys: [id number] HAYLEY N NAASZ  Sonographer Comments: Imaging in supine position due to pain. IMPRESSIONS  1.  Compared to previous echo, LVEF is improved . Left ventricular ejection fraction, by estimation, is 55%. The left ventricle has normal function. The left ventricle has no regional wall motion abnormalities. There is moderate concentric left ventricular hypertrophy. Left ventricular diastolic parameters were normal.  2. Right ventricular systolic function is normal. The right ventricular size is normal.  3. Compared to echo from October 2023, pericardial effusion is a little less prominence.Marland Kitchen a small pericardial effusion is present. The pericardial effusion is circumferential.  4. The mitral valve is abnormal. Trivial mitral valve regurgitation. No evidence of mitral stenosis.  5. The aortic valve is tricuspid. Aortic valve regurgitation is not visualized. No aortic stenosis is present.  6. The inferior  vena cava is dilated in size with <50% respiratory variability, suggesting right atrial pressure of 15 mmHg. FINDINGS  Left Ventricle: Compared to previous echo, LVEF is improved. Left ventricular ejection fraction, by estimation, is 55%. The left ventricle has normal function. The left ventricle has no regional wall motion abnormalities. The left ventricular internal cavity size was normal in size. There is moderate concentric left ventricular hypertrophy. Left ventricular diastolic parameters were normal. Right Ventricle: The right ventricular size is normal. No increase in right ventricular wall thickness. Right ventricular systolic function is normal. Left Atrium: Left atrial size was normal in size. Right Atrium: Right atrial size was normal in size. Pericardium: Compared to echo from October 2023, pericardial effusion is a little less prominence. A small pericardial effusion is present. The pericardial effusion is circumferential. Mitral Valve: The mitral valve is abnormal. Trivial mitral valve regurgitation. No evidence of mitral valve stenosis. Tricuspid Valve: The tricuspid valve is grossly normal. Tricuspid  valve regurgitation is trivial. No evidence of tricuspid stenosis. Aortic Valve: The aortic valve is tricuspid. Aortic valve regurgitation is not visualized. No aortic stenosis is present. Pulmonic Valve: The pulmonic valve was grossly normal. Pulmonic valve regurgitation is not visualized. No evidence of pulmonic stenosis. Aorta: The aortic root and ascending aorta are structurally normal, with no evidence of dilitation. Venous: The inferior vena cava is dilated in size with less than 50% respiratory variability, suggesting right atrial pressure of 15 mmHg. IAS/Shunts: No atrial level shunt detected by color flow Doppler.  LEFT VENTRICLE PLAX 2D LVIDd:         5.00 cm   Diastology LVIDs:         3.60 cm   LV e' medial:    7.62 cm/s LV PW:         1.50 cm   LV E/e' medial:  14.8 LV IVS:        1.40 cm   LV e' lateral:   9.99 cm/s LVOT diam:     2.00 cm   LV E/e' lateral: 11.3 LV SV:         78 LV SV Index:   42 LVOT Area:     3.14 cm  RIGHT VENTRICLE RV S prime:     14.80 cm/s TAPSE (M-mode): 2.5 cm LEFT ATRIUM             Index        RIGHT ATRIUM           Index LA diam:        3.80 cm 2.02 cm/m   RA Area:     13.00 cm LA Vol (A2C):   62.2 ml 33.11 ml/m  RA Volume:   30.30 ml  16.13 ml/m LA Vol (A4C):   55.3 ml 29.44 ml/m LA Biplane Vol: 59.4 ml 31.62 ml/m  AORTIC VALVE LVOT Vmax:   130.00 cm/s LVOT Vmean:  88.700 cm/s LVOT VTI:    0.249 m  AORTA Ao Root diam: 3.10 cm Ao Asc diam:  3.10 cm MITRAL VALVE MV Area (PHT): 4.17 cm     SHUNTS MV Decel Time: 182 msec     Systemic VTI:  0.25 m MV E velocity: 113.00 cm/s  Systemic Diam: 2.00 cm MV A velocity: 51.90 cm/s MV E/A ratio:  2.18 Dorris Carnes MD Electronically signed by Dorris Carnes MD Signature Date/Time: 06/03/2022/1:22:51 PM    Final    CT Angio Chest/Abd/Pel for Dissection W and/or Wo Contrast  Result Date: 06/03/2022 CLINICAL DATA:  Sudden epigastric pain. EXAM: CT ANGIOGRAPHY CHEST, ABDOMEN AND PELVIS TECHNIQUE: Non-contrast CT of the chest was  initially obtained. Multidetector CT imaging through the chest, abdomen and pelvis was performed using the standard protocol during bolus administration of intravenous contrast. Multiplanar reconstructed images and MIPs were obtained and reviewed to evaluate the vascular anatomy. RADIATION DOSE REDUCTION: This exam was performed according to the departmental dose-optimization program which includes automated exposure control, adjustment of the mA and/or kV according to patient size and/or use of iterative reconstruction technique. CONTRAST:  134mL OMNIPAQUE IOHEXOL 350 MG/ML SOLN COMPARISON:  March 02, 2022, May 18, 2021 and July 16, 2017 FINDINGS: CTA CHEST FINDINGS Cardiovascular: The thoracic aorta is normal in appearance. Satisfactory opacification of the pulmonary arteries to the segmental level. No evidence of pulmonary embolism. Normal heart size. A 1.2 cm thick pericardial effusion is seen (approximately 1.18 Hounsfield units). This is most prominent along the posterior and lateral aspect of the left ventricle and is increased in size when compared to the prior study. Mediastinum/Nodes: No enlarged mediastinal, hilar, or axillary lymph nodes. Thyroid gland, trachea, and esophagus demonstrate no significant findings. Lungs/Pleura: There is no evidence of acute infiltrate, pleural effusion or pneumothorax. Musculoskeletal: No chest wall abnormality. No acute or significant osseous findings. Review of the MIP images confirms the above findings. CTA ABDOMEN AND PELVIS FINDINGS VASCULAR Aorta: Normal caliber aorta without aneurysm, dissection, vasculitis or significant stenosis. Celiac: Patent without evidence of aneurysm, dissection, vasculitis or significant stenosis. SMA: Patent without evidence of aneurysm, dissection, vasculitis or significant stenosis. Renals: Both renal arteries are patent without evidence of aneurysm, dissection, vasculitis, fibromuscular dysplasia or significant stenosis. IMA:  Patent without evidence of aneurysm, dissection, vasculitis or significant stenosis. Inflow: Patent without evidence of aneurysm, dissection, vasculitis or significant stenosis. Veins: No obvious venous abnormality within the limitations of this arterial phase study. Review of the MIP images confirms the above findings. NON-VASCULAR Hepatobiliary: No focal liver abnormality is seen. The gallbladder is contracted. No gallstones, gallbladder wall thickening, or biliary dilatation. Pancreas: Unremarkable. No pancreatic ductal dilatation or surrounding inflammatory changes. Spleen: Normal in size without focal abnormality. Adrenals/Urinary Tract: Adrenal glands are unremarkable. The kidneys are atrophic in appearance, without evidence of renal calculi or hydronephrosis. Oval stable bilateral renal cysts are seen. Bladder is unremarkable. Stomach/Bowel: Stomach is within normal limits. The appendix is not clearly identified. The small bowel loops seen throughout the abdomen and pelvis are poorly distended and subsequently limited in evaluation. Stool is seen throughout the ascending and transverse colon. No evidence of bowel dilatation. Lymphatic: Subcentimeter para-aortic and bilateral inguinal lymph nodes are noted. Reproductive: Prostate is unremarkable. Other: No abdominal wall hernia or abnormality. No abdominopelvic ascites. Musculoskeletal: No acute or significant osseous findings. Review of the MIP images confirms the above findings. IMPRESSION: 1. No evidence of pulmonary embolism. 2. Small to moderate sized pericardial effusion, increased in size compared to the prior study. 3. No evidence of an acute or active process within the abdomen or pelvis. 4. Atrophic kidneys with stable bilateral renal cysts. No follow-up imaging is recommended. This recommendation follows ACR consensus guidelines: Management of the Incidental Renal Mass on CT: A White Paper of the ACR Incidental Findings Committee. J Am Coll Radiol  [phone]. 5. No evidence of aneurysmal dilatation, dissection, major vessel occlusion or hemodynamically significant stenosis involving the thoracic aorta, abdominal aorta and arterial structures within the abdomen and pelvis. 6. Markedly limited evaluation of poorly distended large and small bowel loops, as described above. Follow-up abdomen  and pelvis CT following oral contrast administration is recommended if an acute process within the abdomen or pelvis remains of clinical concern. Electronically Signed   By: Virgina Norfolk M.D.   On: 06/03/2022 03:54     Vladimir Crofts  06/04/2022, 1:16 PM   Attending physician's note  I have taken a history, reviewed the chart and examined the patient. I performed a substantive portion of this encounter, including complete performance of at least one of the key components, in conjunction with the APP. I agree with the APP's note, impression and recommendations.    37 year old male with end-stage renal disease on dialysis with complaints of epigastric abdominal pain and retrosternal chest pain Intermittent dysphagia Will plan to proceed with EGD for further evaluation, exclude erosive esophagitis or peptic ulcer disease He gets dialyzed on Thursday, schedule the procedure for Friday N.p.o. after midnight on Thursday Continue PPI twice daily   The patient was provided an opportunity to ask questions and all were answered. The patient agreed with the plan and demonstrated an understanding of the instructions.  Damaris Hippo , MD [phone]

## 2022-06-04 NOTE — Progress Notes (Signed)
FMTS Interim Progress Note  Received page from RN about patient beginning to have high anxiety, severe headache, nausea and vomiting.  Assessed patient at bedside with Dr. Larae Grooms.  He was actively vomiting when we went to bedside.  Prior to evaluation, day team ordered Compazine 10 mg and Imitrex to help with his migraine.  Per day RN documentation, Compazine was unable to be given when ordered, however in the [facility] Compazine is documented as given at [place].  Today's Vitals   06/04/22 1345 06/04/22 1607 06/04/22 1812 06/04/22 2008  BP: (!) 154/88 (!) 154/88 (!) 166/110 (!) 153/91  Pulse: 81 81 84 78  Resp: 18 17 18    Temp: 98.1 F (36.7 C) 98.1 F (36.7 C) (!) 97.5 F (36.4 C) 98.1 F (36.7 C)  TempSrc: Oral Oral Oral Oral  SpO2: 99% 99% 100% 98%  Weight:      PainSc:    10-Worst pain ever   Body mass index is 25.17 kg/m. Unable to be examined due to emesis Did not appear to be in acute distress  Assessment/Plan:  Due to patient having high anxiety and vomiting and unknown administration of Compazine, will order Zofran 4 mg once and Atarax 10 mg.  Will reassess patient in a few hours and will reorder Compazine when the appropriate time is passed.  HTN:  Will monitor pressures overnight.  If he becomes severely hypertensive to systolics greater then 564 will order IV BP med for better control.   Darci Current, DO 06/04/2022, 8:39 PM PGY-1, [place] [facility] pager [phone]

## 2022-06-04 NOTE — Progress Notes (Signed)
Pt remains here for GI work-up and is still OBV status. Plan HD tomorrow on schedule. See orders.   Kelly Splinter, MD 06/04/2022, 3:47 PM

## 2022-06-04 NOTE — Progress Notes (Signed)
FMTS Interim Progress Note  S: Visited bedside with Dr. Ronnald Ramp due to RN page about severe headache.  Patient having extreme headache with photosensitivity, reports pain/pressure in his chest that moves up to his jaw.  Denies pain in his arms.  Difficult to get a full history because patient is clearly very uncomfortable, moving around in bed, shielding from light, and has tears.  Patient able to state that he has these headaches frequently. Patient reports nausea.  O: BP (!) 166/110   Pulse 84   Temp (!) 97.5 F (36.4 C) (Oral)   Resp 18   Wt 75.1 kg   SpO2 100%   BMI 25.17 kg/m    Exam limited due to patient cooperation/distress Gen: Uncomfortable, in distress, keeping eyes closed/shielding from light Neuro: PERRLA, upper extremity strength 5/5 and equal bilaterally. No gross focal deficits noted.  Speaking full sentences, coherent Resp: No respiratory distress   A/P:  Severe headache  Chest pressure  Given photosensitivity and nausea with multiple similar episodes documented in the past, high suspicion for acute migraine.  No significant gross neuro deficits to suggest intracranial pathology or complex migraine.  BP elevated to 166/110 in the setting of his acute pain/distress. Differential also includes hypertensive emergency vs subarachnoid hemorrhage, but feel these are less likely given high suspicion for migraine.  In past episodes, he has had similar reported chest pressure/jaw pain with these migraines. Low suspicion for ACS but will still obtain EKG and troponin.  Plan:  -IV Compazine x1 -SubQ Sumatriptan x1 -Continue PRN dilaudid, scheduled Tylenol -Continue current BP meds: amlodipine, coreg, hydralazine -EKG, Troponin -If BP continues to rise with worsening of symptoms, consider labetalol -If symptoms persist and/or develops neuro deficits, consider CT head   August Albino, MD 06/04/2022, 6:18 PM PGY-1, [place] [facility] pager [phone]

## 2022-06-05 ENCOUNTER — Encounter (HOSPITAL_COMMUNITY): Payer: Self-pay | Admitting: Family Medicine

## 2022-06-05 DIAGNOSIS — R112 Nausea with vomiting, unspecified: Secondary | ICD-10-CM | POA: Diagnosis present

## 2022-06-05 DIAGNOSIS — N186 End stage renal disease: Secondary | ICD-10-CM | POA: Diagnosis present

## 2022-06-05 DIAGNOSIS — K219 Gastro-esophageal reflux disease without esophagitis: Secondary | ICD-10-CM | POA: Diagnosis present

## 2022-06-05 DIAGNOSIS — Z888 Allergy status to other drugs, medicaments and biological substances status: Secondary | ICD-10-CM | POA: Diagnosis not present

## 2022-06-05 DIAGNOSIS — Z841 Family history of disorders of kidney and ureter: Secondary | ICD-10-CM | POA: Diagnosis not present

## 2022-06-05 DIAGNOSIS — I5022 Chronic systolic (congestive) heart failure: Secondary | ICD-10-CM | POA: Diagnosis present

## 2022-06-05 DIAGNOSIS — H9190 Unspecified hearing loss, unspecified ear: Secondary | ICD-10-CM | POA: Diagnosis present

## 2022-06-05 DIAGNOSIS — Z9049 Acquired absence of other specified parts of digestive tract: Secondary | ICD-10-CM | POA: Diagnosis not present

## 2022-06-05 DIAGNOSIS — D631 Anemia in chronic kidney disease: Secondary | ICD-10-CM | POA: Diagnosis present

## 2022-06-05 DIAGNOSIS — K2980 Duodenitis without bleeding: Secondary | ICD-10-CM | POA: Diagnosis present

## 2022-06-05 DIAGNOSIS — Z992 Dependence on renal dialysis: Secondary | ICD-10-CM | POA: Diagnosis not present

## 2022-06-05 DIAGNOSIS — F319 Bipolar disorder, unspecified: Secondary | ICD-10-CM | POA: Diagnosis present

## 2022-06-05 DIAGNOSIS — G43909 Migraine, unspecified, not intractable, without status migrainosus: Secondary | ICD-10-CM | POA: Insufficient documentation

## 2022-06-05 DIAGNOSIS — E162 Hypoglycemia, unspecified: Secondary | ICD-10-CM | POA: Diagnosis present

## 2022-06-05 DIAGNOSIS — K319 Disease of stomach and duodenum, unspecified: Secondary | ICD-10-CM | POA: Diagnosis present

## 2022-06-05 DIAGNOSIS — I132 Hypertensive heart and chronic kidney disease with heart failure and with stage 5 chronic kidney disease, or end stage renal disease: Secondary | ICD-10-CM | POA: Diagnosis present

## 2022-06-05 DIAGNOSIS — K2960 Other gastritis without bleeding: Secondary | ICD-10-CM | POA: Diagnosis present

## 2022-06-05 DIAGNOSIS — K859 Acute pancreatitis without necrosis or infection, unspecified: Secondary | ICD-10-CM | POA: Diagnosis present

## 2022-06-05 DIAGNOSIS — R1013 Epigastric pain: Secondary | ICD-10-CM | POA: Diagnosis not present

## 2022-06-05 DIAGNOSIS — F209 Schizophrenia, unspecified: Secondary | ICD-10-CM | POA: Diagnosis present

## 2022-06-05 DIAGNOSIS — I3139 Other pericardial effusion (noninflammatory): Secondary | ICD-10-CM | POA: Diagnosis present

## 2022-06-05 DIAGNOSIS — F17210 Nicotine dependence, cigarettes, uncomplicated: Secondary | ICD-10-CM | POA: Diagnosis present

## 2022-06-05 DIAGNOSIS — I120 Hypertensive chronic kidney disease with stage 5 chronic kidney disease or end stage renal disease: Secondary | ICD-10-CM | POA: Diagnosis not present

## 2022-06-05 DIAGNOSIS — R1310 Dysphagia, unspecified: Secondary | ICD-10-CM | POA: Diagnosis present

## 2022-06-05 DIAGNOSIS — Z833 Family history of diabetes mellitus: Secondary | ICD-10-CM | POA: Diagnosis not present

## 2022-06-05 DIAGNOSIS — Q8781 Alport syndrome: Secondary | ICD-10-CM | POA: Diagnosis not present

## 2022-06-05 LAB — CBC
HCT: 41 % (ref 39.0–52.0)
Hemoglobin: 13.4 g/dL (ref 13.0–17.0)
MCH: 29.8 pg (ref 26.0–34.0)
MCHC: 32.7 g/dL (ref 30.0–36.0)
MCV: 91.1 fL (ref 80.0–100.0)
Platelets: 148 10*3/uL — ABNORMAL LOW (ref 150–400)
RBC: 4.5 MIL/uL (ref 4.22–5.81)
RDW: 14.2 % (ref 11.5–15.5)
WBC: 2.9 10*3/uL — ABNORMAL LOW (ref 4.0–10.5)
nRBC: 0 % (ref 0.0–0.2)

## 2022-06-05 LAB — RENAL FUNCTION PANEL
Albumin: 3.4 g/dL — ABNORMAL LOW (ref 3.5–5.0)
Anion gap: 13 (ref 5–15)
BUN: 80 mg/dL — ABNORMAL HIGH (ref 6–20)
CO2: 23 mmol/L (ref 22–32)
Calcium: 8.6 mg/dL — ABNORMAL LOW (ref 8.9–10.3)
Chloride: 94 mmol/L — ABNORMAL LOW (ref 98–111)
Creatinine, Ser: 14.43 mg/dL — ABNORMAL HIGH (ref 0.61–1.24)
GFR, Estimated: 4 mL/min — ABNORMAL LOW (ref >60)
Glucose, Bld: 145 mg/dL — ABNORMAL HIGH (ref 70–99)
Phosphorus: 8.7 mg/dL — ABNORMAL HIGH (ref 2.5–4.6)
Potassium: 5.5 mmol/L — ABNORMAL HIGH (ref 3.5–5.1)
Sodium: 130 mmol/L — ABNORMAL LOW (ref 135–145)

## 2022-06-05 MED ORDER — HEPARIN SODIUM (PORCINE) 1000 UNIT/ML DIALYSIS: 1200.0000 [IU] | Freq: Once | INTRAMUSCULAR | Status: DC

## 2022-06-05 MED ORDER — CALCIUM ACETATE (PHOS BINDER) 667 MG PO CAPS
2001.0000 mg | ORAL_CAPSULE | Freq: Three times a day (TID) | ORAL | Status: DC
  Administered 2022-06-06: 2001 mg via ORAL
  Filled 2022-06-05 (×2): qty 3

## 2022-06-05 MED ORDER — PROCHLORPERAZINE EDISYLATE 10 MG/2ML IJ SOLN
10.0000 mg | Freq: Four times a day (QID) | INTRAMUSCULAR | Status: DC | PRN
  Administered 2022-06-05: 10 mg via INTRAVENOUS
  Filled 2022-06-05: qty 2

## 2022-06-05 MED ORDER — HYDRALAZINE HCL 20 MG/ML IJ SOLN
10.0000 mg | INTRAMUSCULAR | Status: DC | PRN
  Administered 2022-06-05: 10 mg via INTRAVENOUS
  Filled 2022-06-05: qty 1

## 2022-06-05 MED ORDER — DIVALPROEX SODIUM ER 500 MG PO TB24
500.0000 mg | ORAL_TABLET | Freq: Every day | ORAL | Status: DC
  Filled 2022-06-05 (×2): qty 1

## 2022-06-05 MED ORDER — SUMATRIPTAN SUCCINATE 6 MG/0.5ML ~~LOC~~ SOLN
6.0000 mg | SUBCUTANEOUS | Status: DC | PRN
  Filled 2022-06-05: qty 0.5

## 2022-06-05 MED ORDER — CALCITRIOL 0.5 MCG PO CAPS
1.7500 ug | ORAL_CAPSULE | ORAL | Status: DC
  Filled 2022-06-05: qty 1

## 2022-06-05 NOTE — Progress Notes (Signed)
Patient has once again began screaming with searing pain to head and neck.  Gave patient atarax and compazine.  [place] to please send imitrex as it is once again not available.  Pt went to sleep very shortly and called RN back to room stating " I fall to sleep and I can't breathe, something is really wrong with me".  Patient is breathing independently.  He is now walking independently around the unit.  No imitrex has arrived to give patient but he seems to have calmed down. Sreece, RN

## 2022-06-05 NOTE — Assessment & Plan Note (Signed)
Had severe migraine yesterday (HA, photosensitivity, tearing, N/V). Similar severe HA's noted at prior admissions. Per pt, he has these headaches near daily and especially after dialysis days. Triptan yesterday was helpful. Tylenol has not been helpful in the past. Plan to start depakoate for migraine ppx. - Start Depakoat ER 500 daily - Sumitriptan prn for HA - Compazine prn for N/V

## 2022-06-05 NOTE — Consult Note (Addendum)
Renal Service Consult Note [facility]  HUDSEN FEI 06/05/2022 Sol Blazing, MD Requesting Physician: Dr. Wendy Poet, T.   Reason for Consult: ESRD pt w/ persistent abd pain, nausea HPI: The patient is a 37 y.o. year-old w/ PMH as below who presented 1/23 w/ abd pain, epigastric. CT AP was neg for pancreatitis, pt was admitted and seen by GI. Plan is for EGD tomorrow. We are asked to see for dialysis.   Pt seen in HD unit. No c/o's today other than the abd pain. No CP, SOB or fevers.   ROS - denies CP, no joint pain, no HA, no blurry vision, no rash, no dysuria, no difficulty voiding   Past Medical History  Past Medical History:  Diagnosis Date   Bipolar 1 disorder ([place])    CKD (chronic kidney disease)    on dialysis   Depression    GERD (gastroesophageal reflux disease)    Hearing difficulty of left ear    75% hearing   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   Hypertension    Low blood sugar    Renal disorder    Past Surgical History  Past Surgical History:  Procedure Laterality Date   APPENDECTOMY     AV FISTULA PLACEMENT Left 03/03/2019   Procedure: ARTERIOVENOUS (AV) FISTULA CREATION LEFT ARM;  Surgeon: Waynetta Sandy, MD;  Location: [place];  Service: Vascular;  Laterality: Left;   [place] Right 04/12/2019   Procedure: RIGHT UPPER EXTREMITY [place];  Surgeon: Angelia Mould, MD;  Location: [place];  Service: Vascular;  Laterality: Right;   BIOPSY  10/14/2021   Procedure: BIOPSY;  Surgeon: Doran Stabler, MD;  Location: [facility];  Service: Gastroenterology;;   COLONOSCOPY WITH PROPOFOL N/A 10/14/2021   Procedure: COLONOSCOPY WITH PROPOFOL;  Surgeon: Doran Stabler, MD;  Location: [facility];  Service: Gastroenterology;  Laterality: N/A;   FLEXIBLE SIGMOIDOSCOPY N/A 04/14/2022   Procedure: FLEXIBLE SIGMOIDOSCOPY;  Surgeon: Mauri Pole, MD;  Location:  [place];  Service: Gastroenterology;  Laterality: N/A;   LIGATION OF ARTERIOVENOUS  FISTULA Left 03/06/2019   Procedure: LIGATION OF ARTERIOVENOUS  FISTULA;  Surgeon: Marty Heck, MD;  Location: [facility] OR;  Service: Vascular;  Laterality: Left;   spinal tap     SPINE SURGERY     related to a spinal infection, unsure of surgery or infection source   WISDOM TOOTH EXTRACTION     Family History  Family History  Problem Relation Age of Onset   Hypertension Mother    Kidney failure Mother    Diabetes Mother    Hearing loss Father    Hypertension Sister    Heart disease Maternal Grandmother    Stroke Maternal Grandfather    Asthma Son    Social History  reports that he has been smoking cigarettes. He has been smoking an average of .3 packs per day. He has never used smokeless tobacco. He reports that he does not currently use alcohol. He reports current drug use. Drug: Marijuana. Allergies  Allergies  Allergen Reactions   Zestril [Lisinopril] Swelling   Nsaids Other (See Comments)    "Kidney problems "   Risperdal [Risperidone] Other (See Comments)    Unknown reaction    Home medications Prior to Admission medications   Medication Sig Start Date End Date Taking? Authorizing Provider  acetaminophen (TYLENOL) 325 MG tablet Take 2 tablets (650 mg total) by  mouth every 6 (six) hours as needed for mild pain (or Fever >/= 101). 04/14/22  Yes Lowry Ram, MD  amLODipine (NORVASC) 10 MG tablet TAKE 1 TABLET BY MOUTH EVERY DAY 04/10/22  Yes [name], DO  calcitRIOL (ROCALTROL) 0.25 MCG capsule Take 0.25 mcg by mouth daily.   Yes [provider]  calcium acetate (PHOSLO) 667 MG capsule Take 1 capsule (667 mg total) by mouth 3 (three) times daily with meals. Patient taking differently: Take 2,001 mg by mouth 3 (three) times daily with meals. 01/04/22  Yes August Albino, MD  carvedilol (COREG) 25 MG tablet TAKE 1 TABLET BY MOUTH TWICE A DAY 05/14/22  Yes Espinoza,  Alejandra, DO  hydrALAZINE (APRESOLINE) 100 MG tablet Take 1 tablet (100 mg total) by mouth 3 (three) times daily. 05/20/22  Yes Marylu Lund., NP  hydrocortisone-pramoxine [facility]) 2.5-1 % rectal cream Place rectally 2 (two) times daily. 04/14/22  Yes Lowry Ram, MD  isosorbide mononitrate (IMDUR) 30 MG 24 hr tablet Take 1 tablet (30 mg total) by mouth daily. 05/21/22  Yes Marylu Lund., NP  losartan (COZAAR) 50 MG tablet Take 50 mg by mouth daily. 05/15/22  Yes [provider]  pantoprazole (PROTONIX) 40 MG tablet Take 40 mg by mouth daily.   Yes [provider]  triamcinolone ointment (KENALOG) 0.1 % Apply to scrotum twice daily for itching. Do not use for more than 2 weeks continuously 05/30/22  Yes [name], DO     Vitals:   06/05/22 1127 06/05/22 1149 06/05/22 1208 06/05/22 1445  BP: (!) 168/95 (!) 159/95 (!) 158/97 (!) 160/92  Pulse:  89 92 85  Resp: 16 18 18 17   Temp:  (!) 97.5 F (36.4 C) (!) 97.5 F (36.4 C) 98.5 F (36.9 C)  TempSrc:  Oral Oral Oral  SpO2:  97% 95% 100%  Weight:   71.1 kg    Exam Gen alert, no distress No rash, cyanosis or gangrene Sclera anicteric, throat clear  No jvd or bruits Chest clear bilat to bases, no rales/ wheezing RRR no MRG Abd soft mild tenderness epigastric area no mass or ascites +bs GU normal male MS no joint effusions or deformity Ext no LE or UE edema, no wounds or ulcers Neuro is alert, Ox 3 , nf    RUA AVF+bruit    Home meds include - norvasc 10, coreg 25 bid, hydralazine 100 tid, imdur 30, prns    OP HD: [facility] 4h  400/1.5  73kg  2/2 bath  R AVF  Hep 1200 - last HD 1/19, post wt 74.1kg - rocaltrol 1.75 ug po biw   Assessment/ Plan: Abd pain - epigastric, for EGD tomorrow per GI, on PPI and carafate HTN - on multiple BP lowering meds at home. Getting all home meds here. BP's were high, now down close to normal. Got 2.5 L UF today and is down 2kg under dry wt now post HD.  ESRD -  on HD TTS. HD today, and then Saturday.   Volume - as above, has lost body wt and this is probably driving vol ^ and causing poor BP control.  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 Cont home binder (phoslo 3 ac), cont po vdra.  H/o Alport's w/ sig hearing loss Bipolar disorder      Kelly Splinter  MD 06/05/2022, 4:18 PM Recent Labs  Lab 06/04/22 0018 06/05/22 0022  HGB  13.6 13.4  ALBUMIN 3.6 3.4*  CALCIUM 8.9 8.6*  PHOS 7.2* 8.7*  CREATININE 11.96* 14.43*  K 4.2 5.5*   Inpatient medications:  acetaminophen  650 mg Oral Q6H   amLODipine  10 mg Oral Daily   calcitRIOL  0.25 mcg Oral Daily   calcium acetate  667 mg Oral TID WC   carvedilol  25 mg Oral BID   Chlorhexidine Gluconate Cloth  6 each Topical Q0600   Chlorhexidine Gluconate Cloth  6 each Topical Q0600   divalproex  500 mg Oral QHS   heparin  5,000 Units Subcutaneous Q8H   hydrALAZINE  100 mg Oral TID   isosorbide mononitrate  30 mg Oral Daily   pantoprazole  40 mg Oral BID   polyethylene glycol  17 g Oral Daily   prochlorperazine  10 mg Intravenous Once   sucralfate  1 g Oral BID    heparin, hydrALAZINE, HYDROmorphone (DILAUDID) injection, hydrOXYzine, prochlorperazine, SUMAtriptan

## 2022-06-05 NOTE — Progress Notes (Signed)
   06/05/22 1208  Vitals  Temp (!) 97.5 F (36.4 C)  Temp Source Oral  BP (!) 158/97  MAP (mmHg) 116  BP Location Left Arm  BP Method Automatic  Patient Position (if appropriate) Lying  Pulse Rate 92  Pulse Rate Source Monitor  ECG Heart Rate 92  Resp 18  Oxygen Therapy  SpO2 95 %  O2 Device Room Air  Pulse Oximetry Type Continuous   Received patient in bed to unit.  Alert and oriented.  Informed consent signed and in chart.   Treatment initiated: 0804 Treatment completed: 1149  Patient tolerated well.  Transported back to the room  Alert, without acute distress.  Hand-off given to patient's nurse.   Access used: AVF Access issues: NA  Total UF removed: 2536ml Medication(s) given: Heparin 1200 units, Hydralazine 10mg  IVP Post HD VS: see above Post HD weight: 71.1kg   [facility]

## 2022-06-05 NOTE — Progress Notes (Signed)
Pt receives out-pt HD at [facility] on [facility]. Will assist as needed.   Gracyn Santillanes Renal Navigator [phone] 

## 2022-06-05 NOTE — Plan of Care (Signed)
Pt not in room-to dialysis  [employer], RN

## 2022-06-05 NOTE — Procedures (Signed)
   I was present at this dialysis session, have reviewed the session itself and made  appropriate changes Kelly Splinter MD [place] pager [phone]   06/05/2022, 5:17 PM

## 2022-06-05 NOTE — Plan of Care (Signed)
Spoke with Pharmacy regarding "given" charted on compazine.  The medication was scanned, but due to the medication discontinued, it was not given to patient.  Per pharmacy, once the medication goes into "discontinued" mode, we cannot chart not given as the med was scanned even tho the system alerted medication discontinued.  This is a epic error and unable to be corrected.  Per pharmacy, as long as RN writes note as to action, then that is the best form of communication regarding discontinued medications.  Medication was not given to patient at [place], 06/04/22.  Sreece, RN

## 2022-06-05 NOTE — Plan of Care (Signed)
Pt arrived back to room from dialysis. He is A&O, NADN. AM medications given.  Pt ordered lunch. No needs at present. Sreece, RN

## 2022-06-05 NOTE — Hospital Course (Addendum)
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   Epigastric Abm Pain Pt presented w/ 2 day hx of severe epigastric abm pain, improves w/ eating. Pt also had N/V, but was still tolerating PO, passing stool and gas. Pt was started on Protonix, sucralfate, tylenol, and dilaudid w/ improvement of pain. GI was consulted, underwent EGD which showed gastritis and duodenitis. Biopsies pending at time of discharge. Pt was discharged on Protonix 40 PO BID w/ control of symptoms.   Migraine Pt has hx of near daily migraine headaches. Pt was tx w/ sumitriptan and compazine w/ improvement. Pt was started on depakote for migraine prophylaxis. Pt Imdur was discontinued given potential trigger of migraines.  HFmrEF Follows outpt w/ Cardiology. Echo 06/03/22 showed EF 55%. Euvolemic and normal resp status during this admission.   ESRD on Dialysis T/Th/Sa Nephrology was consulted, and pt was continued on home dialysis schedule.   PCP Follow-up 1) Recheck BP and BMP. Losartan was restarted during this admission for BP control. 2) Imdur was disontinued for potential migraine triggers. 3) Started Depakote for migrain ppx. Increase dose if tolerating at follow-up.  4) Follow-up EGD biopsy results.

## 2022-06-06 ENCOUNTER — Inpatient Hospital Stay (HOSPITAL_COMMUNITY): Payer: Medicare Other | Admitting: Anesthesiology

## 2022-06-06 ENCOUNTER — Encounter (HOSPITAL_COMMUNITY): Payer: Self-pay | Admitting: Family Medicine

## 2022-06-06 ENCOUNTER — Ambulatory Visit: Payer: Medicare Other | Attending: Pharmacist | Admitting: Pharmacist

## 2022-06-06 ENCOUNTER — Encounter (HOSPITAL_COMMUNITY)
Admission: EM | Disposition: A | Discharge status: Home / Self Care | Payer: Self-pay | Source: Home / Self Care | Attending: Family Medicine

## 2022-06-06 ENCOUNTER — Other Ambulatory Visit (HOSPITAL_COMMUNITY): Payer: Self-pay

## 2022-06-06 DIAGNOSIS — F17210 Nicotine dependence, cigarettes, uncomplicated: Secondary | ICD-10-CM

## 2022-06-06 DIAGNOSIS — N186 End stage renal disease: Secondary | ICD-10-CM

## 2022-06-06 DIAGNOSIS — K2980 Duodenitis without bleeding: Secondary | ICD-10-CM

## 2022-06-06 DIAGNOSIS — R1013 Epigastric pain: Secondary | ICD-10-CM | POA: Diagnosis not present

## 2022-06-06 DIAGNOSIS — I120 Hypertensive chronic kidney disease with stage 5 chronic kidney disease or end stage renal disease: Secondary | ICD-10-CM

## 2022-06-06 DIAGNOSIS — Z992 Dependence on renal dialysis: Secondary | ICD-10-CM

## 2022-06-06 DIAGNOSIS — K2970 Gastritis, unspecified, without bleeding: Secondary | ICD-10-CM

## 2022-06-06 DIAGNOSIS — K2960 Other gastritis without bleeding: Secondary | ICD-10-CM

## 2022-06-06 HISTORY — PX: BIOPSY: SHX5522

## 2022-06-06 HISTORY — PX: ESOPHAGOGASTRODUODENOSCOPY (EGD) WITH PROPOFOL: SHX5813

## 2022-06-06 LAB — RENAL FUNCTION PANEL
Albumin: 3.2 g/dL — ABNORMAL LOW (ref 3.5–5.0)
Anion gap: 14 (ref 5–15)
BUN: 66 mg/dL — ABNORMAL HIGH (ref 6–20)
CO2: 24 mmol/L (ref 22–32)
Calcium: 8.7 mg/dL — ABNORMAL LOW (ref 8.9–10.3)
Chloride: 92 mmol/L — ABNORMAL LOW (ref 98–111)
Creatinine, Ser: 11.94 mg/dL — ABNORMAL HIGH (ref 0.61–1.24)
GFR, Estimated: 5 mL/min — ABNORMAL LOW (ref >60)
Glucose, Bld: 73 mg/dL (ref 70–99)
Phosphorus: 8.3 mg/dL — ABNORMAL HIGH (ref 2.5–4.6)
Potassium: 4.7 mmol/L (ref 3.5–5.1)
Sodium: 130 mmol/L — ABNORMAL LOW (ref 135–145)

## 2022-06-06 LAB — GLUCOSE, CAPILLARY: Glucose-Capillary: 72 mg/dL (ref 70–99)

## 2022-06-06 LAB — CBC
HCT: 39.9 % (ref 39.0–52.0)
Hemoglobin: 13.3 g/dL (ref 13.0–17.0)
MCH: 30 pg (ref 26.0–34.0)
MCHC: 33.3 g/dL (ref 30.0–36.0)
MCV: 89.9 fL (ref 80.0–100.0)
Platelets: 146 10*3/uL — ABNORMAL LOW (ref 150–400)
RBC: 4.44 MIL/uL (ref 4.22–5.81)
RDW: 14 % (ref 11.5–15.5)
WBC: 2.9 10*3/uL — ABNORMAL LOW (ref 4.0–10.5)
nRBC: 0 % (ref 0.0–0.2)

## 2022-06-06 LAB — HEPATITIS B SURFACE ANTIBODY, QUANTITATIVE: Hep B S AB Quant (Post): 268.5 m[IU]/mL (ref >9.9)

## 2022-06-06 SURGERY — ESOPHAGOGASTRODUODENOSCOPY (EGD) WITH PROPOFOL
Anesthesia: Monitor Anesthesia Care

## 2022-06-06 MED ORDER — SUMATRIPTAN SUCCINATE 50 MG PO TABS
50.0000 mg | ORAL_TABLET | ORAL | 0 refills | Status: DC | PRN
  Filled 2022-06-06: qty 9, 3d supply, fill #0

## 2022-06-06 MED ORDER — PROPOFOL 500 MG/50ML IV EMUL
INTRAVENOUS | Status: DC | PRN
  Administered 2022-06-06: 150 ug/kg/min via INTRAVENOUS

## 2022-06-06 MED ORDER — LOSARTAN POTASSIUM 50 MG PO TABS
50.0000 mg | ORAL_TABLET | Freq: Every day | ORAL | 0 refills | Status: DC
  Filled 2022-06-06: qty 30, 30d supply, fill #0

## 2022-06-06 MED ORDER — PANTOPRAZOLE SODIUM 40 MG PO TBEC
40.0000 mg | DELAYED_RELEASE_TABLET | Freq: Two times a day (BID) | ORAL | 0 refills | Status: DC
  Filled 2022-06-06: qty 60, 30d supply, fill #0

## 2022-06-06 MED ORDER — LABETALOL HCL 5 MG/ML IV SOLN
INTRAVENOUS | Status: AC
  Filled 2022-06-06: qty 4

## 2022-06-06 MED ORDER — LABETALOL HCL 5 MG/ML IV SOLN
10.0000 mg | INTRAVENOUS | Status: DC | PRN
  Administered 2022-06-06: 10 mg via INTRAVENOUS

## 2022-06-06 MED ORDER — PROPOFOL 10 MG/ML IV BOLUS
INTRAVENOUS | Status: DC | PRN
  Administered 2022-06-06 (×2): 30 mg via INTRAVENOUS

## 2022-06-06 MED ORDER — SODIUM CHLORIDE 0.9 % IV SOLN: INTRAVENOUS | Status: DC

## 2022-06-06 MED ORDER — DIVALPROEX SODIUM ER 500 MG PO TB24
500.0000 mg | ORAL_TABLET | Freq: Every day | ORAL | 0 refills | Status: DC
  Filled 2022-06-06: qty 30, 30d supply, fill #0

## 2022-06-06 MED ORDER — LIDOCAINE 2% (20 MG/ML) 5 ML SYRINGE
INTRAMUSCULAR | Status: DC | PRN
  Administered 2022-06-06: 60 mg via INTRAVENOUS

## 2022-06-06 MED ORDER — DEXMEDETOMIDINE HCL IN NACL 80 MCG/20ML IV SOLN
INTRAVENOUS | Status: DC | PRN
  Administered 2022-06-06 (×3): 4 ug via BUCCAL

## 2022-06-06 SURGICAL SUPPLY — 15 items

## 2022-06-06 NOTE — Interval H&P Note (Signed)
History and Physical Interval Note:  06/06/2022 7:55 AM  [name]  has presented today for surgery, with the diagnosis of epigastric pain, dysphagia.  The various methods of treatment have been discussed with the patient and family. After consideration of risks, benefits and other options for treatment, the patient has consented to  Procedure(s): ESOPHAGOGASTRODUODENOSCOPY (EGD) WITH PROPOFOL (N/A) as a surgical intervention.  The patient's history has been reviewed, patient examined, no change in status, stable for surgery.  I have reviewed the patient's chart and labs.  Questions were answered to the patient's satisfaction.     [name]

## 2022-06-06 NOTE — Anesthesia Postprocedure Evaluation (Signed)
Anesthesia Post Note  Patient: [name]  Procedure(s) Performed: ESOPHAGOGASTRODUODENOSCOPY (EGD) WITH PROPOFOL BIOPSY     Patient location during evaluation: PACU Anesthesia Type: MAC Level of consciousness: awake and alert Pain management: pain level controlled Vital Signs Assessment: post-procedure vital signs reviewed and stable Respiratory status: spontaneous breathing, nonlabored ventilation and respiratory function stable Cardiovascular status: blood pressure returned to baseline Postop Assessment: no apparent nausea or vomiting Anesthetic complications: no   No notable events documented.  Last Vitals:  Vitals:   06/06/22 1000 06/06/22 1015  BP: (!) 161/102 (!) 170/110  Pulse: 75 73  Resp: 14 17  Temp:  36.7 C  SpO2: 97% 94%    Last Pain:  Vitals:   06/06/22 1015  TempSrc:   PainSc: Asleep                 Marthenia Rolling

## 2022-06-06 NOTE — Op Note (Signed)
[facility] Patient Name: [name] Procedure Date : 06/06/2022 MRN: [id number] Attending MD: Mauri Pole , MD, [id number] Date of Birth: 09-Oct-1985 CSN: [id number] Age: 37 Admit Type: Inpatient Procedure:                Upper GI endoscopy Indications:              Epigastric abdominal pain, Dyspepsia Providers:                Mauri Pole, MD, Fanny Skates RN, RN,                            Gloris Ham, Technician Referring MD:              Medicines:                Monitored Anesthesia Care Complications:            No immediate complications. Estimated Blood Loss:     Estimated blood loss was minimal. Procedure:                Pre-Anesthesia Assessment:                           - Prior to the procedure, a History and Physical                            was performed, and patient medications and                            allergies were reviewed. The patient's tolerance of                            previous anesthesia was also reviewed. The risks                            and benefits of the procedure and the sedation                            options and risks were discussed with the patient.                            All questions were answered, and informed consent                            was obtained. Prior Anticoagulants: The patient has                            taken no anticoagulant or antiplatelet agents. ASA                            Grade Assessment: III - A patient with severe                            systemic disease. After reviewing the risks and  benefits, the patient was deemed in satisfactory                            condition to undergo the procedure.                           After obtaining informed consent, the endoscope was                            passed under direct vision. Throughout the                            procedure, the patient's blood pressure, pulse, and                             oxygen saturations were monitored continuously. The                            [id number]-H190 ([id number]) Olympus endoscope was introduced                            through the mouth, and advanced to the second part                            of duodenum. The upper GI endoscopy was                            accomplished without difficulty. The patient                            tolerated the procedure well. Scope In: Scope Out: Findings:      The Z-line was regular and was found 38 cm from the incisors.      The esophagus was normal.      Patchy moderate inflammation characterized by congestion (edema),       erosions, erythema and friability was found in the entire examined       stomach. Biopsies were taken with a cold forceps for histology. Biopsies       were taken with a cold forceps for Helicobacter pylori testing.      Patchy moderate inflammation characterized by congestion (edema),       erosions and erythema was found in the first portion of the duodenum and       in the second portion of the duodenum. Biopsies were taken with a cold       forceps for histology. Impression:               - Z-line regular, 38 cm from the incisors.                           - Normal esophagus.                           - Erosive gastritis. Biopsied.                           - Erosive  duodenitis. Biopsied. Recommendation:           - Resume previous diet.                           - Continue present medications.                           - Continue Pantoprazole twice daily                           - Await pathology results. GI will follow up                            results and call patient.                           - No ibuprofen, naproxen, or other non-steroidal                            anti-inflammatory drugs.                           - GI will sign off, available if have any questions Procedure Code(s):        --- Professional ---                           [phone],  Esophagogastroduodenoscopy, flexible,                            transoral; with biopsy, single or multiple Diagnosis Code(s):        --- Professional ---                           K29.60, Other gastritis without bleeding                           K29.80, Duodenitis without bleeding                           R10.13, Epigastric pain CPT copyright 2022 American Medical Association. All rights reserved. The codes documented in this report are preliminary and upon coder review may  be revised to meet current compliance requirements. Mauri Pole, MD 06/06/2022 9:59:29 AM This report has been signed electronically. Number of Addenda: 0

## 2022-06-06 NOTE — Anesthesia Procedure Notes (Signed)
Procedure Name: MAC Date/Time: 06/06/2022 9:33 AM  Performed by: Heide Scales, CRNAPre-anesthesia Checklist: Patient identified, Emergency Drugs available, Suction available and Patient being monitored Patient Re-evaluated:Patient Re-evaluated prior to induction Oxygen Delivery Method: Nasal cannula Preoxygenation: Pre-oxygenation with 100% oxygen Induction Type: IV induction Dental Injury: Teeth and Oropharynx as per pre-operative assessment

## 2022-06-06 NOTE — Discharge Instructions (Addendum)
Dear [name],   Thank you so much for allowing [place] to be part of your care!  You were admitted to [facility] for abdominal pain and we are glad you are feeling better. You will be notified of any pending biopsy results.   For your migraines we started you on Depakote daily and Sumatriptan as needed. If headache persists you can take another pill 2 hours later. Do not take more than 200mg  in a 24 hour period. Do not exceed the 20 pills in a month. Call PCP if more than 10 headaches per month.   Please avoid NSAIDs as it will make your abdominal pain worse!   POST-HOSPITAL & CARE INSTRUCTIONS Please let PCP/Specialists know of any changes that were made.  Please see medications section of this packet for any medication changes.   DOCTOR'S APPOINTMENT & FOLLOW UP CARE INSTRUCTIONS  Future Appointments  Date Time Provider [place]  06/11/2022  2:00 PM Rafoth, Dorian Pod, MD [facility]  06/27/2022 10:05 AM Barbarann Ehlers, Junius Creamer., NP [facility]    Take care and be well!  [facility]  [place], [place] [address] [phone]

## 2022-06-06 NOTE — Anesthesia Preprocedure Evaluation (Addendum)
Anesthesia Evaluation  Patient identified by MRN, date of birth, ID band Patient awake    Reviewed: Allergy & Precautions, NPO status , Patient's Chart, lab work & pertinent test results, reviewed documented beta blocker date and time   History of Anesthesia Complications Negative for: history of anesthetic complications  Airway Mallampati: I  TM Distance: >3 FB Neck ROM: Full    Dental no notable dental hx.    Pulmonary Current Smoker   Pulmonary exam normal        Cardiovascular hypertension, Pt. on medications and Pt. on home beta blockers Normal cardiovascular exam     Neuro/Psych  Headaches   Depression Bipolar Disorder      GI/Hepatic Neg liver ROS,GERD  Medicated,,epigastric pain, dysphagia   Endo/Other  negative endocrine ROS    Renal/GU Dialysis and ESRFRenal disease  negative genitourinary   Musculoskeletal negative musculoskeletal ROS (+)    Abdominal   Peds  Hematology negative hematology ROS (+)   Anesthesia Other Findings Day of surgery medications reviewed with patient.  Reproductive/Obstetrics negative OB ROS                              Anesthesia Physical Anesthesia Plan  ASA: 3  Anesthesia Plan: MAC   Post-op Pain Management: Minimal or no pain anticipated   Induction:   PONV Risk Score and Plan: Treatment may vary due to age or medical condition and Propofol infusion  Airway Management Planned: Natural Airway and Nasal Cannula  Additional Equipment: None  Intra-op Plan:   Post-operative Plan:   Informed Consent: I have reviewed the patients History and Physical, chart, labs and discussed the procedure including the risks, benefits and alternatives for the proposed anesthesia with the patient or authorized representative who has indicated his/her understanding and acceptance.       Plan Discussed with: CRNA  Anesthesia Plan Comments:          Anesthesia Quick Evaluation

## 2022-06-06 NOTE — Transfer of Care (Signed)
Immediate Anesthesia Transfer of Care Note  Patient: Randolf L Glassburn  Procedure(s) Performed: ESOPHAGOGASTRODUODENOSCOPY (EGD) WITH PROPOFOL BIOPSY  Patient Location: PACU  Anesthesia Type:MAC  Level of Consciousness: drowsy  Airway & Oxygen Therapy: Patient Spontanous Breathing and Patient connected to nasal cannula oxygen  Post-op Assessment: Report given to RN and Post -op Vital signs reviewed and stable  Post vital signs: Reviewed and stable  Last Vitals:  Vitals Value Taken Time  BP 179/108 06/06/22 0950  Temp    Pulse 79 06/06/22 0952  Resp 12 06/06/22 0952  SpO2 98 % 06/06/22 0952  Vitals shown include unvalidated device data.  Dr. Daiva Huge aware of hypertension.   Last Pain:  Vitals:   06/06/22 0726  TempSrc: Temporal  PainSc: 0-No pain      Patients Stated Pain Goal: 0 (50/35/46 5681)  Complications: No notable events documented.

## 2022-06-06 NOTE — Discharge Summary (Addendum)
[facility] Discharge Summary  Patient name: [name] record number: [id number] Date of birth: [birth year] Age: 37 y.o. 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   Epigastric Abm Pain Pt presented w/ 2 day hx of severe epigastric abm pain, improves w/ eating. Pt also had N/V, but was still tolerating PO, passing stool and gas. Pt was started on Protonix, sucralfate, tylenol, and dilaudid w/ improvement of pain. GI was consulted, underwent EGD which showed gastritis and duodenitis. Biopsies pending at time of discharge. Pt was discharged on Protonix 40 PO BID w/ control of symptoms.   Migraine Pt has hx of near daily migraine headaches. Pt was tx w/ sumitriptan and compazine w/ improvement. Pt was started on depakote for migraine prophylaxis. Pt Imdur was discontinued given potential trigger of migraines.  HFmrEF Follows outpt w/ Cardiology. Echo 06/03/22 showed EF 55%. Euvolemic and normal resp status during this admission.   ESRD on Dialysis T/Th/Sa Nephrology was consulted, and pt was continued on home dialysis schedule.   PCP Follow-up 1) Recheck BP and BMP. Losartan was restarted during this admission for BP control. 2) Imdur was disontinued for potential migraine triggers. 3) Started Depakote for migrain ppx. Increase dose if tolerating at follow-up.  4) Follow-up EGD biopsy results.  Discharge Diagnoses/Problem List:  Abm pain, migraines, ESRD on dialysis, HFmrEF  Disposition: Home  Discharge Condition: Improved, stable  Discharge Exam:  Gen: Well-appearing, sitting up in bed. NAD HEENT: NCAT. MMM. Resp: Normal  WOB on RA CV: RRR Abm: Soft, nontender, nondistended.   Significant Procedures: EGD w/ GI  Significant Labs and Imaging:  Recent Labs  Lab 06/05/22 0022 06/06/22 0404  WBC 2.9* 2.9*  HGB 13.4 13.3  HCT 41.0 39.9  PLT 148* 146*   Recent Labs  Lab 06/05/22 0022 06/06/22 0404  NA 130* 130*  K 5.5* 4.7  CL 94* 92*  CO2 23 24  GLUCOSE 145* 73  BUN 80* 66*  CREATININE 14.43* 11.94*  CALCIUM 8.6* 8.7*  PHOS 8.7* 8.3*  ALBUMIN 3.4* 3.2*   Results/Tests Pending at Time of Discharge:  - EGD biopsy results  Discharge Medications:  Allergies as of 06/06/2022       Reactions   Zestril [lisinopril] Swelling   Nsaids Other (See Comments)   "Kidney problems "   Risperdal [risperidone] Other (See Comments)   Unknown reaction         Medication List     STOP taking these medications    isosorbide mononitrate 30 MG 24 hr tablet Commonly known as: IMDUR       TAKE these medications    acetaminophen 325 MG tablet Commonly known as: TYLENOL Take 2 tablets (650 mg total) by mouth every 6 (six) hours as needed for mild pain (or Fever >/= 101).   amLODipine 10 MG tablet Commonly known as: NORVASC TAKE 1 TABLET BY MOUTH EVERY DAY   calcitRIOL 0.25 MCG capsule Commonly known as: ROCALTROL Take 0.25 mcg by mouth daily.   calcium acetate 667 MG capsule Commonly known as: PHOSLO Take 1 capsule (667 mg total) by mouth 3 (three) times daily with meals. What changed: how much to take   carvedilol 25  MG tablet Commonly known as: COREG TAKE 1 TABLET BY MOUTH TWICE A DAY   divalproex 500 MG 24 hr tablet Commonly known as: DEPAKOTE ER Take 1 tablet (500 mg total) by mouth at bedtime.   hydrALAZINE 100 MG tablet Commonly known as: APRESOLINE Take 1 tablet (100 mg total) by mouth 3 (three) times daily.   hydrocortisone-pramoxine 2.5-1 % rectal cream Commonly known as: ANALPRAM-HC Place rectally 2 (two) times daily.   losartan 50 MG tablet Commonly known as:  COZAAR Take 1 tablet (50 mg total) by mouth daily.   pantoprazole 40 MG tablet Commonly known as: PROTONIX Take 1 tablet (40 mg total) by mouth 2 (two) times daily. What changed: when to take this   SUMAtriptan 50 MG tablet Commonly known as: IMITREX Take 1 tablet (50 mg total) by mouth every 2 (two) hours as needed for migraine. Do not take more than 4 tablets in 24 hours.   triamcinolone ointment 0.1 % Commonly known as: KENALOG Apply to scrotum twice daily for itching. Do not use for more than 2 weeks continuously        Discharge Instructions: Please refer to Patient Instructions section of EMR for full details.  Patient was counseled important signs and symptoms that should prompt return to medical care, changes in medications, dietary instructions, activity restrictions, and follow up appointments.   Follow-Up Appointments: Friday Feb 2 3:10pm w/ [name]  Arlyce Dice, MD 06/06/2022, 11:45 AM PGY-1, [place]   I verified the service and findings are accurately documented in the intern's note.  Precious Gilding, DO 06/06/2022 12:14 PM

## 2022-06-06 NOTE — Progress Notes (Signed)
D/C order noted. Contacted [facility] [facility] to advise clinic of pt's d/c today and that pt should resume care tomorrow.   Melven Sartorius Renal Navigator [phone]

## 2022-06-07 ENCOUNTER — Telehealth: Payer: Self-pay | Admitting: Nurse Practitioner

## 2022-06-07 NOTE — Telephone Encounter (Signed)
Transition of care contact from inpatient facility  Date of Discharge: 06/06/2022 Date of Contact: 06/07/2022 Method of contact: Phone  Attempted to contact patient to discuss transition of care from inpatient admission. Patient did not answer the phone. Message was left on the patient's voicemail with call back number [phone].

## 2022-06-07 NOTE — Progress Notes (Signed)
Transition of care contact from inpatient facility.  Left message for patient earlier regarding recent admission to [facility] 01/23-01/26/2024 with admission diagnosis with abdominal pain/migraine. Patient returned phone call. Advised that he will be seen by provider in HD unit to follow up after hospitalization. He says he is doing "all right".   Patient was supposed to go [facility] this AM however attendance not showing in [place] and patient hung up before he answered question about going to HD.

## 2022-06-09 LAB — SURGICAL PATHOLOGY

## 2022-06-11 ENCOUNTER — Ambulatory Visit (INDEPENDENT_AMBULATORY_CARE_PROVIDER_SITE_OTHER): Payer: Medicare Other | Admitting: Sports Medicine

## 2022-06-11 VITALS — BP 192/104 | Ht 69.0 in | Wt 165.0 lb

## 2022-06-11 DIAGNOSIS — G8929 Other chronic pain: Secondary | ICD-10-CM | POA: Diagnosis not present

## 2022-06-11 DIAGNOSIS — M25512 Pain in left shoulder: Secondary | ICD-10-CM | POA: Diagnosis present

## 2022-06-11 NOTE — Progress Notes (Signed)
  [name] - 37 y.o. male MRN [id number]  Date of birth: [birth year]    CHIEF COMPLAINT:   L shoulder pain    SUBJECTIVE:   HPI:  Very pleasant 37 male comes to clinic to follow-up left shoulder pain.  He says the subacromial injection we did at his last visit only gave him maybe a week or 2 of pain relief.  He is still experiencing significant pain over the anterior and lateral aspects of the shoulder, especially with overhead movements.  This made his home exercises that he was doing very painful.  He feels "a lot of popping" and instability of the joint.  His x-rays of left shoulder showed no acute bony abnormalities.  ROS:     See HPI  PERTINENT  PMH / PSH FH / / SH:  Past Medical, Surgical, Social, and Family History Reviewed & Updated in the EMR.  Pertinent findings include:  Alport syndrome  OBJECTIVE: BP (!) 192/104   Ht 5\' 9"  (1.753 m)   Wt 165 lb (74.8 kg)   BMI 24.37 kg/m   Physical Exam:  Vital signs are reviewed.  GEN: Alert and oriented, NAD Pulm: Breathing unlabored PSY: normal mood, congruent affect  MSK: Left shoulder -no obvious deformity.  Nontender to palpation at the [facility] joint, or biceps tendon in bicipital groove.  Active range of motion 90 degrees abduction with positive painful arc, 90 degrees forward flexion.  Positive empty can test that elicits pain and weakness.  Positive O'Brien's test.  Positive apprehension test.  Positive crank test.  ASSESSMENT & PLAN:  1.  Left shoulder pain  -This is a chronic issue that is worsening and has not responded to conservative treatment.  I think patient will benefit from obtaining further imaging to assess the integrity of his labrum given his positive provocative testing and persistent pain and instability.  I have ordered a MRI with a 3T machine to evaluate his cuff and labrum.  I am hopeful that this can be performed without contrast as to not interfere with his ESRD/dialysis schedule.  Will have the patient  return to clinic to discuss his MRI findings since he is hard of hearing I do not think this would be appropriate to do over the phone.  After the MRI we can discuss next steps which may include a referral to Dr. Griffin Basil at [facility].  All questions were answered and he agrees to plan.  [name], MD PGY-4, Sports Medicine Fellow [place]  Addendum:  I was the preceptor for this visit and available for immediate consultation.  Karlton Lemon MD Kirt Boys

## 2022-06-13 ENCOUNTER — Ambulatory Visit: Payer: Self-pay | Admitting: Family Medicine

## 2022-06-13 NOTE — Progress Notes (Unsigned)
    SUBJECTIVE:   CHIEF COMPLAINT / HPI:   LEAMAN ABE is a 37 y.o. male who presents to the [facility] clinic today to discuss the following concerns:   Hospital Follow Up Mr. Jaroszewski was recently admitted 1/23 - 1/26 for abdominal pain and migraine.  For his epigastric abdominal pain he was started on Protonix, sucralfate, Tylenol, and Dilaudid with improvement.  GI was consulted and he underwent a EGD which showed gastritis and duodenitis.  Biopsies are pending at the time of discharge.  He was discharged on Protonix 40 twice daily.  While in the hospital he also experienced migraines.  See he was treated with sumatriptan and Compazine and then started on Depakote for migraine prophylaxis.  His Imdur was discontinued given thought that it was potentially triggering his migraines.  "PCP Follow-up 1) Recheck BP and BMP. Losartan was restarted during this admission for BP control. 2) Imdur was disontinued for potential migraine triggers. 3) Started Depakote for migrain ppx. Increase dose if tolerating at follow-up.  4) Follow-up EGD biopsy results."  Duodenal biopsy showed no specific histopathologic changes.  Stomach biopsy was negative for H. pylori, mild nonspecific reactive gastropathy.  PERTINENT  PMH / PSH: Hypertension, Alport syndrome, ESRD on HD, bipolar 1 disorder  OBJECTIVE:   There were no vitals taken for this visit. ***  General: NAD, pleasant, able to participate in exam Cardiac: RRR, no murmurs. Respiratory: CTAB, normal effort, No wheezes, rales or rhonchi Abdomen: Bowel sounds present, nontender, nondistended, no hepatosplenomegaly. Psych: Normal affect and mood  ASSESSMENT/PLAN:   No problem-specific Assessment & Plan notes found for this encounter.     [name], [place]

## 2022-06-13 NOTE — Progress Notes (Deleted)
    SUBJECTIVE:   CHIEF COMPLAINT / HPI:   [name] is a 37 y.o. male who presents to the [facility] clinic today to discuss the following concerns:   Hospital Follow Up Mr. Hegarty was recently admitted 1/23 - 1/26 for abdominal pain and migraine.  For his epigastric abdominal pain he was started on Protonix, sucralfate, Tylenol, and Dilaudid with improvement.  GI was consulted and he underwent a EGD which showed gastritis and duodenitis.  Biopsies are pending at the time of discharge.  He was discharged on Protonix 40 twice daily.  While in the hospital he also experienced migraines.  See he was treated with sumatriptan and Compazine and then started on Depakote for migraine prophylaxis.  His Imdur was discontinued given thought that it was potentially triggering his migraines.  "PCP Follow-up 1) Recheck BP and BMP. Losartan was restarted during this admission for BP control. 2) Imdur was disontinued for potential migraine triggers. 3) Started Depakote for migrain ppx. Increase dose if tolerating at follow-up.  4) Follow-up EGD biopsy results."  Duodenal biopsy showed no specific histopathologic changes.  Stomach biopsy was negative for H. pylori, mild nonspecific reactive gastropathy.  PERTINENT  PMH / PSH: Hypertension, Alport syndrome, ESRD on HD, bipolar 1 disorder  OBJECTIVE:   There were no vitals taken for this visit. ***  General: NAD, pleasant, able to participate in exam Cardiac: RRR, no murmurs. Respiratory: CTAB, normal effort, No wheezes, rales or rhonchi Abdomen: Bowel sounds present, nontender, nondistended, no hepatosplenomegaly. Psych: Normal affect and mood  ASSESSMENT/PLAN:   No problem-specific Assessment & Plan notes found for this encounter.     [name], [place]

## 2022-06-13 NOTE — Patient Instructions (Incomplete)
It was wonderful to see you today.  Please bring ALL of your medications with you to every visit.   Today we talked about:  **  Thank you for coming to your visit as scheduled. We have had a large "no-show" problem lately, and this significantly limits our ability to see and care for patients. As a friendly reminder- if you cannot make your appointment please call to cancel. We do have a no show policy for those who do not cancel within 24 hours. Our policy is that if you miss or fail to cancel an appointment within 24 hours, 3 times in a 6-month period, you may be dismissed from our clinic.   Thank you for choosing [facility].   Please call [phone] with any questions about today's appointment.  Please be sure to schedule follow up at the front  desk before you leave today.   Mattie Novosel, DO PGY-3 Family Medicine   

## 2022-06-14 ENCOUNTER — Other Ambulatory Visit: Payer: Self-pay

## 2022-06-14 ENCOUNTER — Emergency Department (HOSPITAL_COMMUNITY): Payer: Medicare Other

## 2022-06-14 ENCOUNTER — Emergency Department (HOSPITAL_COMMUNITY)
Admission: EM | Admit: 2022-06-14 | Discharge: 2022-06-14 | Disposition: A | Discharge status: Home / Self Care | Payer: Medicare Other | Attending: Emergency Medicine | Admitting: Emergency Medicine

## 2022-06-14 DIAGNOSIS — I509 Heart failure, unspecified: Secondary | ICD-10-CM | POA: Insufficient documentation

## 2022-06-14 DIAGNOSIS — Z992 Dependence on renal dialysis: Secondary | ICD-10-CM

## 2022-06-14 DIAGNOSIS — M79604 Pain in right leg: Secondary | ICD-10-CM | POA: Diagnosis present

## 2022-06-14 DIAGNOSIS — N186 End stage renal disease: Secondary | ICD-10-CM | POA: Insufficient documentation

## 2022-06-14 DIAGNOSIS — T82898A Other specified complication of vascular prosthetic devices, implants and grafts, initial encounter: Secondary | ICD-10-CM

## 2022-06-14 DIAGNOSIS — I770 Arteriovenous fistula, acquired: Secondary | ICD-10-CM | POA: Diagnosis not present

## 2022-06-14 DIAGNOSIS — Z79899 Other long term (current) drug therapy: Secondary | ICD-10-CM | POA: Diagnosis not present

## 2022-06-14 LAB — CBC WITH DIFFERENTIAL/PLATELET
Abs Immature Granulocytes: 0.01 10*3/uL (ref 0.00–0.07)
Basophils Absolute: 0 10*3/uL (ref 0.0–0.1)
Basophils Relative: 1 %
Eosinophils Absolute: 0.2 10*3/uL (ref 0.0–0.5)
Eosinophils Relative: 4 %
HCT: 36.5 % — ABNORMAL LOW (ref 39.0–52.0)
Hemoglobin: 11.6 g/dL — ABNORMAL LOW (ref 13.0–17.0)
Immature Granulocytes: 0 %
Lymphocytes Relative: 35 %
Lymphs Abs: 1.6 10*3/uL (ref 0.7–4.0)
MCH: 29.7 pg (ref 26.0–34.0)
MCHC: 31.8 g/dL (ref 30.0–36.0)
MCV: 93.6 fL (ref 80.0–100.0)
Monocytes Absolute: 0.3 10*3/uL (ref 0.1–1.0)
Monocytes Relative: 7 %
Neutro Abs: 2.4 10*3/uL (ref 1.7–7.7)
Neutrophils Relative %: 53 %
Platelets: 174 10*3/uL (ref 150–400)
RBC: 3.9 MIL/uL — ABNORMAL LOW (ref 4.22–5.81)
RDW: 14 % (ref 11.5–15.5)
WBC: 4.6 10*3/uL (ref 4.0–10.5)
nRBC: 0 % (ref 0.0–0.2)

## 2022-06-14 LAB — BASIC METABOLIC PANEL
Anion gap: 13 (ref 5–15)
BUN: 53 mg/dL — ABNORMAL HIGH (ref 6–20)
CO2: 30 mmol/L (ref 22–32)
Calcium: 9.2 mg/dL (ref 8.9–10.3)
Chloride: 97 mmol/L — ABNORMAL LOW (ref 98–111)
Creatinine, Ser: 10.91 mg/dL — ABNORMAL HIGH (ref 0.61–1.24)
GFR, Estimated: 6 mL/min — ABNORMAL LOW (ref >60)
Glucose, Bld: 75 mg/dL (ref 70–99)
Potassium: 5 mmol/L (ref 3.5–5.1)
Sodium: 140 mmol/L (ref 135–145)
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
## 2022-06-14 NOTE — Discharge Instructions (Signed)
You were evaluated today for right leg pain and for possible complications of your AV fistula.  Please return tomorrow to vascular imaging so that they may do the ultrasound of your AV fistula.  If there are no complications with the imaging you will be able to return to normal dialysis.  If there are complications you will need to see either vascular surgery or interventional radiology for further management.  X-rays of your right femur showed no acute fracture or abnormality.  It is unclear as to what is causing your severe pain.  This may be a deep contusion or could possibly be a ligamentous or muscular injury.  Please take Tylenol for pain at this time.  Due to your end-stage kidney disease it is not recommended that she take ibuprofen.  Please follow-up as needed with orthopedics.  I have attached their contact information.

## 2022-06-14 NOTE — ED Notes (Signed)
Vascular Provider at bedside.
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
## 2022-06-16 ENCOUNTER — Ambulatory Visit (INDEPENDENT_AMBULATORY_CARE_PROVIDER_SITE_OTHER): Payer: Medicare Other | Admitting: Family Medicine

## 2022-06-16 ENCOUNTER — Encounter: Payer: Self-pay | Admitting: Family Medicine

## 2022-06-16 VITALS — BP 168/100 | HR 96 | Temp 97.7°F | Ht 69.0 in | Wt 169.6 lb

## 2022-06-16 DIAGNOSIS — I10 Essential (primary) hypertension: Secondary | ICD-10-CM

## 2022-06-16 DIAGNOSIS — I770 Arteriovenous fistula, acquired: Secondary | ICD-10-CM | POA: Diagnosis not present

## 2022-06-16 DIAGNOSIS — M79651 Pain in right thigh: Secondary | ICD-10-CM | POA: Diagnosis not present

## 2022-06-16 DIAGNOSIS — G43809 Other migraine, not intractable, without status migrainosus: Secondary | ICD-10-CM

## 2022-06-16 MED ORDER — CARVEDILOL 25 MG PO TABS: 25.0000 mg | ORAL_TABLET | Freq: Two times a day (BID) | ORAL | 1 refills | Status: DC

## 2022-06-16 NOTE — Patient Instructions (Addendum)
It was wonderful to see you today.  Please bring ALL of your medications with you to every visit.   Today we talked about:  We are doing lab work today to check your electrolytes. I will send you a MyChart message if you have MyChart. Otherwise, I will give you a call for abnormal results or send a letter if everything returned back normal. If you don't hear from me in 2 weeks, please call the office.    We will work on scheduling your ultrasound for your arm.  Ice your leg and take Tylenol as needed for the pain.   Thank you for coming to your visit as scheduled. We have had a large "no-show" problem lately, and this significantly limits our ability to see and care for patients. As a friendly reminder- if you cannot make your appointment please call to cancel. We do have a no show policy for those who do not cancel within 24 hours. Our policy is that if you miss or fail to cancel an appointment within 24 hours, 3 times in a 76-month period, you may be dismissed from our clinic.   Thank you for choosing [facility].   Please call [phone] with any questions about today's appointment.  Please be sure to schedule follow up at the front  desk before you leave today.   [name], DO PGY-3 Family Medicine

## 2022-06-17 ENCOUNTER — Telehealth: Payer: Self-pay | Admitting: Student

## 2022-06-17 ENCOUNTER — Ambulatory Visit (HOSPITAL_COMMUNITY)
Admission: RE | Admit: 2022-06-17 | Discharge: 2022-06-17 | Disposition: A | Discharge status: Home / Self Care | Payer: Medicare Other | Source: Ambulatory Visit | Attending: Family Medicine | Admitting: Family Medicine

## 2022-06-17 ENCOUNTER — Other Ambulatory Visit (HOSPITAL_COMMUNITY): Payer: Medicare Other

## 2022-06-17 ENCOUNTER — Encounter: Payer: Self-pay | Admitting: Family Medicine

## 2022-06-17 DIAGNOSIS — T82898A Other specified complication of vascular prosthetic devices, implants and grafts, initial encounter: Secondary | ICD-10-CM | POA: Diagnosis not present

## 2022-06-17 DIAGNOSIS — I728 Aneurysm of other specified arteries: Secondary | ICD-10-CM | POA: Diagnosis not present

## 2022-06-17 DIAGNOSIS — Z992 Dependence on renal dialysis: Secondary | ICD-10-CM | POA: Insufficient documentation

## 2022-06-17 DIAGNOSIS — I770 Arteriovenous fistula, acquired: Secondary | ICD-10-CM

## 2022-06-17 LAB — BASIC METABOLIC PANEL
BUN/Creatinine Ratio: 5 — ABNORMAL LOW (ref 9–20)
BUN: 72 mg/dL — ABNORMAL HIGH (ref 6–20)
CO2: 18 mmol/L — ABNORMAL LOW (ref 20–29)
Calcium: 9.2 mg/dL (ref 8.7–10.2)
Chloride: 93 mmol/L — ABNORMAL LOW (ref 96–106)
Creatinine, Ser: 15.63 mg/dL (ref 0.76–1.27)
Glucose: 70 mg/dL (ref 70–99)
Potassium: 5.7 mmol/L — ABNORMAL HIGH (ref 3.5–5.2)
Sodium: 139 mmol/L (ref 134–144)
eGFR: 4 mL/min/{1.73_m2} — ABNORMAL LOW (ref >59)

## 2022-06-17 NOTE — Telephone Encounter (Signed)
Called on behalf of Dr. [name] given he has trouble hearing her voice.  Stated electrolyte abnormalities however he reassured me that he went to his HD session today and otherwise is going well.  Thankful for call.

## 2022-06-21 ENCOUNTER — Encounter (HOSPITAL_COMMUNITY): Payer: Self-pay

## 2022-06-21 ENCOUNTER — Other Ambulatory Visit: Payer: Self-pay

## 2022-06-21 ENCOUNTER — Emergency Department (HOSPITAL_COMMUNITY)
Admission: EM | Admit: 2022-06-21 | Discharge: 2022-06-21 | Disposition: A | Discharge status: Home / Self Care | Payer: Medicare Other | Attending: Emergency Medicine | Admitting: Emergency Medicine

## 2022-06-21 ENCOUNTER — Emergency Department (HOSPITAL_COMMUNITY): Payer: Medicare Other

## 2022-06-21 DIAGNOSIS — Z992 Dependence on renal dialysis: Secondary | ICD-10-CM | POA: Insufficient documentation

## 2022-06-21 DIAGNOSIS — M79672 Pain in left foot: Secondary | ICD-10-CM | POA: Diagnosis present

## 2022-06-21 DIAGNOSIS — M722 Plantar fascial fibromatosis: Secondary | ICD-10-CM | POA: Insufficient documentation

## 2022-06-21 DIAGNOSIS — N186 End stage renal disease: Secondary | ICD-10-CM | POA: Diagnosis not present

## 2022-06-21 DIAGNOSIS — I509 Heart failure, unspecified: Secondary | ICD-10-CM | POA: Diagnosis not present

## 2022-06-21 DIAGNOSIS — R42 Dizziness and giddiness: Secondary | ICD-10-CM | POA: Insufficient documentation

## 2022-06-21 LAB — CBC
HCT: 36.5 % — ABNORMAL LOW (ref 39.0–52.0)
Hemoglobin: 11.8 g/dL — ABNORMAL LOW (ref 13.0–17.0)
MCH: 29.4 pg (ref 26.0–34.0)
MCHC: 32.3 g/dL (ref 30.0–36.0)
MCV: 91 fL (ref 80.0–100.0)
Platelets: 177 10*3/uL (ref 150–400)
RBC: 4.01 MIL/uL — ABNORMAL LOW (ref 4.22–5.81)
RDW: 13.7 % (ref 11.5–15.5)
WBC: 4.6 10*3/uL (ref 4.0–10.5)
nRBC: 0 % (ref 0.0–0.2)

## 2022-06-21 LAB — BASIC METABOLIC PANEL
Anion gap: 17 — ABNORMAL HIGH (ref 5–15)
BUN: 42 mg/dL — ABNORMAL HIGH (ref 6–20)
CO2: 25 mmol/L (ref 22–32)
Calcium: 9.6 mg/dL (ref 8.9–10.3)
Chloride: 93 mmol/L — ABNORMAL LOW (ref 98–111)
Creatinine, Ser: 9 mg/dL — ABNORMAL HIGH (ref 0.61–1.24)
GFR, Estimated: 7 mL/min — ABNORMAL LOW (ref >60)
Glucose, Bld: 121 mg/dL — ABNORMAL HIGH (ref 70–99)
Potassium: 4 mmol/L (ref 3.5–5.1)
Sodium: 135 mmol/L (ref 135–145)

## 2022-06-21 LAB — CBG MONITORING, ED: Glucose-Capillary: 113 mg/dL — ABNORMAL HIGH (ref 70–99)

## 2022-06-21 MED ORDER — OXYCODONE HCL 5 MG PO TABS: 5.0000 mg | ORAL_TABLET | Freq: Four times a day (QID) | ORAL | 0 refills | Status: DC | PRN

## 2022-06-21 MED ORDER — OXYCODONE-ACETAMINOPHEN 5-325 MG PO TABS
1.0000 | ORAL_TABLET | Freq: Once | ORAL | Status: DC
  Filled 2022-06-21: qty 1

## 2022-06-21 MED ORDER — OXYCODONE-ACETAMINOPHEN 5-325 MG PO TABS
1.0000 | ORAL_TABLET | Freq: Once | ORAL | Status: AC
  Administered 2022-06-21: 1 via ORAL
  Filled 2022-06-21: qty 1

## 2022-06-21 NOTE — ED Notes (Signed)
Pt was screaming in pain at this time. RN attempted to give PO Percocet when pt became unconscious while laying on bed. RN called other staff. Pt then woke up quickly and started screaming that his foot is hurting. RN attempted to give PO pain meds again when pt fell back on bed and passed out. RN attempted sternal rub when pt started screaming in pain again. Pt had this episodes x 5. Pt quickly wakes up after 2 seconds of passing out. MD notified of events. Will continue to monitor.

## 2022-06-21 NOTE — ED Provider Notes (Signed)
[facility] Provider Note   CSN: [id number] Arrival date & time: 06/21/22  1335     History  Chief Complaint  Patient presents with   Dizziness    [name] is a 37 y.o. male.  [name] is a 37 year old male with a past medical history of ESRD,  Alport syndrome, GERD, bipolar 1 disorder, secondary hyperparathyroidism, hearing loss, heart failure with mildly reduced ejection fraction presented to the emergency department with left foot pain.  Patient states that he was playing basketball today and when he came down from a shot fell and landed on the heel of his left foot.  He states he immediately felt severe pain across the bottom of his foot.  He states he has been unable to ambulate since then.  He states that the pain is making him feel lightheaded and dizzy.  States he did not pass out.  He denies any chest pain or shortness of breath.  He denies any numbness or weakness.  He states he has not taken anything for pain yet.  The history is provided by the patient.  Dizziness      Home Medications Prior to Admission medications   Medication Sig Start Date End Date Taking? Authorizing Provider  oxyCODONE (ROXICODONE) 5 MG immediate release tablet Take 1 tablet (5 mg total) by mouth every 6 (six) hours as needed for severe pain. 06/21/22  Yes Leanord Asal K, DO  acetaminophen (TYLENOL) 325 MG tablet Take 2 tablets (650 mg total) by mouth every 6 (six) hours as needed for mild pain (or Fever >/= 101). Patient not taking: Reported on 06/16/2022 04/14/22   Lowry Ram, MD  amLODipine (NORVASC) 10 MG tablet TAKE 1 TABLET BY MOUTH EVERY DAY 04/10/22   [name], DO  calcitRIOL (ROCALTROL) 0.25 MCG capsule Take 0.25 mcg by mouth daily. Patient not taking: Reported on 06/16/2022    [provider]  calcium acetate (PHOSLO) 667 MG capsule Take 1 capsule (667 mg total) by mouth 3 (three) times daily with meals. Patient  taking differently: Take 2,001 mg by mouth 3 (three) times daily with meals. 01/04/22   August Albino, MD  carvedilol (COREG) 25 MG tablet Take 1 tablet (25 mg total) by mouth 2 (two) times daily. 06/16/22   [name], DO  divalproex (DEPAKOTE ER) 500 MG 24 hr tablet Take 1 tablet (500 mg total) by mouth at bedtime. 06/06/22   Precious Gilding, DO  hydrALAZINE (APRESOLINE) 100 MG tablet Take 1 tablet (100 mg total) by mouth 3 (three) times daily. 05/20/22   Marylu Lund., NP  hydrocortisone-pramoxine [facility]) 2.5-1 % rectal cream Place rectally 2 (two) times daily. 04/14/22   Lowry Ram, MD  losartan (COZAAR) 50 MG tablet Take 1 tablet (50 mg total) by mouth daily. 06/06/22   Precious Gilding, DO  pantoprazole (PROTONIX) 40 MG tablet Take 1 tablet (40 mg total) by mouth 2 (two) times daily. 06/06/22   Precious Gilding, DO  SUMAtriptan (IMITREX) 50 MG tablet Take 1 tablet (50 mg total) by mouth every 2 (two) hours as needed for migraine. Do not take more than 4 tablets in 24 hours. 06/06/22   Precious Gilding, DO  triamcinolone ointment (KENALOG) 0.1 % Apply to scrotum twice daily for itching. Do not use for more than 2 weeks continuously 05/30/22   [name], DO      Allergies    Zestril [lisinopril], Nsaids, and Risperdal [risperidone]    Review of  Systems   Review of Systems  Neurological:  Positive for dizziness.    Physical Exam Updated Vital Signs BP (!) 183/113 (BP Location: Left Arm)   Pulse (!) 113   Temp 98 F (36.7 C) (Oral)   Resp 20   Ht 5' 9"$  (1.753 m)   Wt 76.7 kg   SpO2 100%   BMI 24.96 kg/m  Physical Exam Vitals and nursing note reviewed.  Constitutional:      Comments: Flailing around holding his leg in the air complaining of pain  HENT:     Head: Normocephalic and atraumatic.     Nose: Nose normal.     Mouth/Throat:     Mouth: Mucous membranes are moist.     Pharynx: Oropharynx is clear.  Eyes:     Extraocular Movements: Extraocular movements intact.      Conjunctiva/sclera: Conjunctivae normal.  Cardiovascular:     Rate and Rhythm: Normal rate and regular rhythm.     Pulses: Normal pulses.     Heart sounds: Normal heart sounds.  Pulmonary:     Effort: Pulmonary effort is normal.     Breath sounds: Normal breath sounds.  Abdominal:     General: Abdomen is flat.  Musculoskeletal:        General: No deformity. Normal range of motion.     Cervical back: Normal range of motion and neck supple.     Comments: Tenderness to palpation along the plantar surface of the left foot, most significant at the heel as well as the medial aspect of the left arch, no overlying skin changes, no tenderness to palpation of the left ankle or shin  Skin:    General: Skin is warm and dry.     Findings: No bruising.     Comments: Right upper extremity fistula  Neurological:     General: No focal deficit present.     Mental Status: [name] and oriented to person, place, and time.     Sensory: No sensory deficit.     Motor: No weakness.     ED Results / Procedures / Treatments   Labs (all labs ordered are listed, but only abnormal results are displayed) Labs Reviewed  BASIC METABOLIC PANEL - Abnormal; Notable for the following components:      Result Value   Chloride 93 (*)    Glucose, Bld 121 (*)    BUN 42 (*)    Creatinine, Ser 9.00 (*)    GFR, Estimated 7 (*)    Anion gap 17 (*)    All other components within normal limits  CBC - Abnormal; Notable for the following components:   RBC 4.01 (*)    Hemoglobin 11.8 (*)    HCT 36.5 (*)    All other components within normal limits  CBG MONITORING, ED - Abnormal; Notable for the following components:   Glucose-Capillary 113 (*)    All other components within normal limits    EKG EKG Interpretation  Date/Time:  Saturday June 21 2022 13:51:43 EST Ventricular Rate:  108 PR Interval:  172 QRS Duration: 88 QT Interval:  346 QTC Calculation: 463 R Axis:   26 Text Interpretation: Sinus  tachycardia ST & T wave abnormality, consider lateral ischemia Abnormal ECG  T-wave inversion in V5-V6 otherwise unchanged from prior Confirmed by Leanord Asal ([id number]) on 06/21/2022 3:15:27 PM  Radiology DG Foot Complete Left  Result Date: 06/21/2022 CLINICAL DATA:  Injury.  Left foot pain and swelling. EXAM: LEFT FOOT -  COMPLETE 3+ VIEW COMPARISON:  None Available. FINDINGS: No fracture.  No bone lesion. Joints normally spaced and aligned. Small plantar calcaneal spur. Soft tissues are unremarkable. IMPRESSION: 1. No fracture or acute finding. Small plantar calcaneal spur. No other abnormality. Electronically Signed   By: Lajean Manes M.D.   On: 06/21/2022 14:30    Procedures Procedures    Medications Ordered in ED Medications  oxyCODONE-acetaminophen (PERCOCET/ROXICET) 5-325 MG per tablet 1 tablet (1 tablet Oral Not Given 06/21/22 1543)    ED Course/ Medical Decision Making/ A&P Clinical Course as of 06/21/22 1714  Sat Jun 21, 2022  1552 Patient's nurse attempted to medicate him with Percocet and when she gave him the medication he "passed out".  She states that he was sternal rubbed and they touched his foot and he immediately woke up and started screaming again.  He stated "that medicine will not work". [VK]  1710 Upon reassessment, the patient is calm and cooperative and is willing to try the Percocet for pain.  He states that he was "passing out" because of his pain and could not take the medicine because he was in too much pain.  He states he was able to fall asleep and is now in a comfortable position and is willing to take the medication.  Patient is stable for discharge home and will be given podiatry follow-up and strict return precautions. [VK]    Clinical Course User Index [VK] Kemper Durie, DO                             Medical Decision Making This patient presents to the ED with chief complaint(s) of foot pain with pertinent past medical history of ESRD on HD,  CHF, Alport syndrome, bipolar disorder, hyperparathyroidism, hearing loss which further complicates the presenting complaint. The complaint involves an extensive differential diagnosis and also carries with it a high risk of complications and morbidity.    The differential diagnosis includes fracture, dislocation, ligamentous injury, strain, arrhythmia, anemia, electrolyte abnormality  Additional history obtained: Additional history obtained from N/A Records reviewed previous admission documents and Primary Care Documents  ED Course and Reassessment: Patient was initially evaluated by provider in triage and had EKG, labs and foot x-ray performed.  Patient's labs showed mild anion gap but otherwise at his baseline.  Patient's foot x-ray shows a bone spur but no acute traumatic injury.  Patient will be given oxycodone and ice pack for pain control and placed in a Ace wrap and will be reassessed.    Independent labs interpretation:  The following labs were independently interpreted: Mildly elevated anion gap otherwise at patient's baseline  Independent visualization of imaging: - I independently visualized the following imaging with scope of interpretation limited to determining acute life threatening conditions related to emergency care: Left foot x-ray, which revealed bone spur but no acute traumatic injury  Consultation: - Consulted or discussed management/test interpretation w/ external professional: N/A  Consideration for admission or further workup: Patient has no emergent conditions requiring admission or further work-up at this time and is stable for discharge home with primary care follow-up  Social Determinants of health: N/A    Amount and/or Complexity of Data Reviewed Labs: ordered. Radiology: ordered.  Risk Prescription drug management.          Final Clinical Impression(s) / ED Diagnoses Final diagnoses:  Left foot pain  Plantar fasciitis of left foot    Rx /  DC  Orders ED Discharge Orders          Ordered    oxyCODONE (ROXICODONE) 5 MG immediate release tablet  Every 6 hours PRN        06/21/22 1713              Kemper Durie, DO 06/21/22 1714

## 2022-06-21 NOTE — Discharge Instructions (Addendum)
You were seen in the emergency department for your foot pain.  Your x-ray did not show obvious broken bone but you do have a bone spur on your heel which you may have landed on causing plantar fasciitis which is inflammation of the tendon on the bottom of your foot.  You can take Tylenol as needed for pain up to every 6 hours and I have given you some oxycodone to take for breakthrough pain.  You can wear the Ace wrap to help with swelling and support and ice your foot and keep it elevated and you can use the crutches as needed for ambulation.  You should follow-up with podiatry to have your foot rechecked and if you are still having pain may need a repeat x-ray for further evaluation.  You should return to the emergency department if you are toes turn blue, or if you have any other significant new concerns.

## 2022-06-21 NOTE — ED Triage Notes (Signed)
Patient reports he is dizzy and passed out and left foot hurts.  Looks like was seen for similar on 2/3

## 2022-06-25 ENCOUNTER — Ambulatory Visit
Admission: RE | Admit: 2022-06-25 | Discharge: 2022-06-25 | Disposition: A | Discharge status: Home / Self Care | Payer: Medicare Other | Source: Ambulatory Visit | Attending: Family Medicine | Admitting: Family Medicine

## 2022-06-25 DIAGNOSIS — G8929 Other chronic pain: Secondary | ICD-10-CM

## 2022-06-26 NOTE — Progress Notes (Deleted)
Office Visit    Patient Name: [name] Date of Encounter: 06/26/2022  Primary Care Provider:  [name], DO Primary Cardiologist:  Larae Grooms, MD Primary Electrophysiologist: None  Chief Complaint    [name] is a 37 y.o. male with PMH of ESRD on HD (Tues,Thurs, Sat), HTN, HFrEF, HTN, GERD, Bipolar and congential hearing loss who presents today for follow-up of hypertension.   Past Medical History    Past Medical History:  Diagnosis Date   Bipolar 1 disorder ([place])    CKD (chronic kidney disease)    on dialysis   Depression    GERD (gastroesophageal reflux disease)    Hearing difficulty of left ear    75% hearing   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   Hypertension    Low blood sugar    Renal disorder    Past Surgical History:  Procedure Laterality Date   APPENDECTOMY     AV FISTULA PLACEMENT Left 03/03/2019   Procedure: ARTERIOVENOUS (AV) FISTULA CREATION LEFT ARM;  Surgeon: Waynetta Sandy, MD;  Location: [place];  Service: Vascular;  Laterality: Left;   [place] Right 04/12/2019   Procedure: RIGHT UPPER EXTREMITY [place];  Surgeon: Angelia Mould, MD;  Location: [place];  Service: Vascular;  Laterality: Right;   BIOPSY  10/14/2021   Procedure: BIOPSY;  Surgeon: Doran Stabler, MD;  [facility];  Service: Gastroenterology;;   BIOPSY  06/06/2022   Procedure: BIOPSY;  Surgeon: Mauri Pole, MD;  Location: [facility];  Service: Gastroenterology;;   COLONOSCOPY WITH PROPOFOL N/A 10/14/2021   Procedure: COLONOSCOPY WITH PROPOFOL;  Surgeon: Doran Stabler, MD;  [facility];  Service: Gastroenterology;  Laterality: N/A;   ESOPHAGOGASTRODUODENOSCOPY (EGD) WITH PROPOFOL N/A 06/06/2022   Procedure: ESOPHAGOGASTRODUODENOSCOPY (EGD) WITH PROPOFOL;  Surgeon: Mauri Pole, MD;  Location: [place];  Service: Gastroenterology;   Laterality: N/A;   FLEXIBLE SIGMOIDOSCOPY N/A 04/14/2022   Procedure: FLEXIBLE SIGMOIDOSCOPY;  Surgeon: Mauri Pole, MD;  Location: [place];  Service: Gastroenterology;  Laterality: N/A;   LIGATION OF ARTERIOVENOUS  FISTULA Left 03/06/2019   Procedure: LIGATION OF ARTERIOVENOUS  FISTULA;  Surgeon: Marty Heck, MD;  Location: [place];  Service: Vascular;  Laterality: Left;   spinal tap     SPINE SURGERY     related to a spinal infection, unsure of surgery or infection source   WISDOM TOOTH EXTRACTION      Allergies  Allergies  Allergen Reactions   Zestril [Lisinopril] Swelling   Nsaids Other (See Comments)    "Kidney problems "   Risperdal [Risperidone] Other (See Comments)    Unknown reaction     History of Present Illness   [name]  is a 37 year old male with the above mention past medical history who presents today for follow-up of hypertension.  He was initially seen by consultation on 03/02/2022 for complaint of chest pain.  His initial blood pressure in the ED was 171/112 and troponin's elevated at 85>> 163>> 269>> 560>>515.  EKG revealed sinus tachycardia and chest x-ray was suggestive of pneumonia.  2D echo was completed showing EF of 40-45% with global hypokinesis, normal RV function, small to moderate pericardial effusion present with no evidence of tamponade and mild MR.  Troponin elevation was suspected to be related to uncontrolled hypertension.  He was advised to continue Coreg 6.25 mg twice daily  and hydralazine 25 mg 3 times daily.  He was seen by Dr. Irish Lack on 04/18/2022 for complaint of hypertension and HFrEF.  He reported chronically elevated blood pressures spike to the [id number] to [id number] systolically.  He reported being off of his hydralazine for past couple of days.  He was encouraged to restart hydralazine and Imdur and follow-up with Pharm.D. for management of hypertension.  He was seen in follow-up 05/09/2022 with elevated blood pressures in the  170s over 100s.  He reported that he forgot to take his medicine prior to his clinic visit.  I increased his hydralazine to 100 mg 3 times daily and he was referred to Pharm.D. for titration.     Since last being seen in the office patient reports***.  Patient denies chest pain, palpitations, dyspnea, PND, orthopnea, nausea, vomiting, dizziness, syncope, edema, weight gain, or early satiety.     ***Notes:  Home Medications    Current Outpatient Medications  Medication Sig Dispense Refill   acetaminophen (TYLENOL) 325 MG tablet Take 2 tablets (650 mg total) by mouth every 6 (six) hours as needed for mild pain (or Fever >/= 101). (Patient not taking: Reported on 06/16/2022)     amLODipine (NORVASC) 10 MG tablet TAKE 1 TABLET BY MOUTH EVERY DAY 90 tablet 3   calcitRIOL (ROCALTROL) 0.25 MCG capsule Take 0.25 mcg by mouth daily. (Patient not taking: Reported on 06/16/2022)     calcium acetate (PHOSLO) 667 MG capsule Take 1 capsule (667 mg total) by mouth 3 (three) times daily with meals. (Patient taking differently: Take 2,001 mg by mouth 3 (three) times daily with meals.) 100 capsule 0   carvedilol (COREG) 25 MG tablet Take 1 tablet (25 mg total) by mouth 2 (two) times daily. 180 tablet 1   divalproex (DEPAKOTE ER) 500 MG 24 hr tablet Take 1 tablet (500 mg total) by mouth at bedtime. 30 tablet 0   hydrALAZINE (APRESOLINE) 100 MG tablet Take 1 tablet (100 mg total) by mouth 3 (three) times daily. 270 tablet 1   hydrocortisone-pramoxine (ANALPRAM-HC) 2.5-1 % rectal cream Place rectally 2 (two) times daily. 30 g 0   losartan (COZAAR) 50 MG tablet Take 1 tablet (50 mg total) by mouth daily. 30 tablet 0   oxyCODONE (ROXICODONE) 5 MG immediate release tablet Take 1 tablet (5 mg total) by mouth every 6 (six) hours as needed for severe pain. 7 tablet 0   pantoprazole (PROTONIX) 40 MG tablet Take 1 tablet (40 mg total) by mouth 2 (two) times daily. 60 tablet 0   SUMAtriptan (IMITREX) 50 MG tablet Take 1  tablet (50 mg total) by mouth every 2 (two) hours as needed for migraine. Do not take more than 4 tablets in 24 hours. 20 tablet 0   triamcinolone ointment (KENALOG) 0.1 % Apply to scrotum twice daily for itching. Do not use for more than 2 weeks continuously 30 g 1   No current facility-administered medications for this visit.     Review of Systems  Please see the history of present illness.    (+)*** (+)***  All other systems reviewed and are otherwise negative except as noted above.  Physical Exam    Wt Readings from Last 3 Encounters:  06/21/22 169 lb (76.7 kg)  06/16/22 169 lb 9.6 oz (76.9 kg)  06/11/22 165 lb (74.8 kg)   [id number] were no vitals filed for this visit.,There is no height or weight on file to calculate BMI.  Constitutional:      Appearance:  Healthy appearance. Not in distress.  Neck:     Vascular: JVD normal.  Pulmonary:     Effort: Pulmonary effort is normal.     Breath sounds: No wheezing. No rales. Diminished in the bases Cardiovascular:     Normal rate. Regular rhythm. Normal S1. Normal S2.      Murmurs: There is no murmur.  Edema:    Peripheral edema absent.  Abdominal:     Palpations: Abdomen is soft non tender. There is no hepatomegaly.  Skin:    General: Skin is warm and dry.  Neurological:     General: No focal deficit present.     Mental Status: Alert and oriented to person, place and time.     Cranial Nerves: Cranial nerves are intact.  EKG/LABS/Other Studies Reviewed    ECG personally reviewed by me today - ***  Risk Assessment/Calculations:   {Does this patient have ATRIAL FIBRILLATION?:[id number]}        Lab Results  Component Value Date   WBC 4.6 06/21/2022   HGB 11.8 (L) 06/21/2022   HCT 36.5 (L) 06/21/2022   MCV 91.0 06/21/2022   PLT 177 06/21/2022   Lab Results  Component Value Date   CREATININE 9.00 (H) 06/21/2022   BUN 42 (H) 06/21/2022   NA 135 06/21/2022   K 4.0 06/21/2022   CL 93 (L) 06/21/2022   CO2 25  06/21/2022   Lab Results  Component Value Date   ALT 30 06/03/2022   AST 30 06/03/2022   ALKPHOS 101 06/03/2022   BILITOT 0.6 06/03/2022   No results found for: "CHOL", "HDL", "LDLCALC", "LDLDIRECT", "TRIG", "CHOLHDL"  Lab Results  Component Value Date   HGBA1C 4.6 05/30/2022    Assessment & Plan    1.  Essential hypertension  2.  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 -GDMT limited by renal status -Low sodium diet, fluid restriction <2L, and daily weights encouraged. Educated to contact our office for weight gain of 2 lbs overnight or 5 lbs in one week.    3.  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  Tobacco abuse: Smoking cessation was advised and patient is planning to start nicotine gum after New Year's.   5.  Chest pain: -Patient reports chest discomfort during dialysis session on Tuesday and has experienced discomfort with increased stressful events. -Continue Imdur 30 mg daily -Discussed pursuing PET/CT for evaluation of possible ischemia if chest pain becomes regular and changes in intensity. -He was advised to seek care in the ED if chest pain becomes increased and not relieved with nitroglycerin     Disposition: Follow-up with Larae Grooms, MD or APP in *** months {Are you ordering a CV Procedure (e.g. stress test, cath, DCCV, TEE, etc)?   Press F2        :[id number]   Medication Adjustments/Labs and Tests Ordered: Current medicines are reviewed at length with the patient today.  Concerns regarding medicines are outlined above.   Signed, Mable Fill, Marissa Nestle, NP 06/26/2022, 12:43 PM [facility]  Note:  This document was prepared using Dragon voice recognition software and may include unintentional dictation  errors.

## 2022-06-27 ENCOUNTER — Ambulatory Visit: Payer: Medicaid Other | Admitting: Nurse Practitioner

## 2022-06-27 DIAGNOSIS — Z720 Tobacco use: Secondary | ICD-10-CM

## 2022-06-27 DIAGNOSIS — I10 Essential (primary) hypertension: Secondary | ICD-10-CM

## 2022-06-27 DIAGNOSIS — N186 End stage renal disease: Secondary | ICD-10-CM

## 2022-06-27 DIAGNOSIS — R079 Chest pain, unspecified: Secondary | ICD-10-CM

## 2022-06-27 DIAGNOSIS — I5022 Chronic systolic (congestive) heart failure: Secondary | ICD-10-CM

## 2022-07-02 ENCOUNTER — Ambulatory Visit: Payer: Medicare Other | Admitting: Sports Medicine

## 2022-07-04 ENCOUNTER — Other Ambulatory Visit (HOSPITAL_COMMUNITY): Payer: Self-pay

## 2022-07-09 ENCOUNTER — Ambulatory Visit: Payer: Medicare Other | Attending: Family Medicine | Admitting: Physical Therapy

## 2022-07-09 ENCOUNTER — Encounter: Payer: Self-pay | Admitting: Physical Therapy

## 2022-07-09 ENCOUNTER — Other Ambulatory Visit: Payer: Self-pay

## 2022-07-09 DIAGNOSIS — R600 Localized edema: Secondary | ICD-10-CM | POA: Diagnosis present

## 2022-07-09 DIAGNOSIS — M6281 Muscle weakness (generalized): Secondary | ICD-10-CM | POA: Diagnosis present

## 2022-07-09 DIAGNOSIS — M25511 Pain in right shoulder: Secondary | ICD-10-CM | POA: Insufficient documentation

## 2022-07-09 DIAGNOSIS — M25512 Pain in left shoulder: Secondary | ICD-10-CM | POA: Diagnosis present

## 2022-07-09 NOTE — Therapy (Signed)
OUTPATIENT PHYSICAL THERAPY SHOULDER EVALUATION   Patient Name: [name] MRN: [id number] DOB:1986/01/02, 37 y.o., male Today's Date: 07/09/2022   PT End of Session - 07/09/22 1342     Visit Number 1    Number of Visits --   1-2x/week   Date for PT Re-Evaluation 09/03/22    Authorization Type MCR - FOTO    Progress Note Due on Visit 10    PT Start Time 0110    PT Stop Time 0142    PT Time Calculation (min) 32 min             Past Medical History:  Diagnosis Date   Bipolar 1 disorder ([place])    CKD (chronic kidney disease)    on dialysis   Depression    GERD (gastroesophageal reflux disease)    Hearing difficulty of left ear    75% hearing   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   Hypertension    Low blood sugar    Renal disorder    Past Surgical History:  Procedure Laterality Date   APPENDECTOMY     AV FISTULA PLACEMENT Left 03/03/2019   Procedure: ARTERIOVENOUS (AV) FISTULA CREATION LEFT ARM;  Surgeon: Waynetta Sandy, MD;  Location: [place];  Service: Vascular;  Laterality: Left;   [place] Right 04/12/2019   Procedure: RIGHT UPPER EXTREMITY [place];  Surgeon: Angelia Mould, MD;  Location: [place];  Service: Vascular;  Laterality: Right;   BIOPSY  10/14/2021   Procedure: BIOPSY;  Surgeon: Doran Stabler, MD;  [facility];  Service: Gastroenterology;;   BIOPSY  06/06/2022   Procedure: BIOPSY;  Surgeon: Mauri Pole, MD;  Location: [facility] [facility];  Service: Gastroenterology;;   COLONOSCOPY WITH PROPOFOL N/A 10/14/2021   Procedure: COLONOSCOPY WITH PROPOFOL;  Surgeon: Doran Stabler, MD;  [facility];  Service: Gastroenterology;  Laterality: N/A;   ESOPHAGOGASTRODUODENOSCOPY (EGD) WITH PROPOFOL N/A 06/06/2022   Procedure: ESOPHAGOGASTRODUODENOSCOPY (EGD) WITH PROPOFOL;  Surgeon: Mauri Pole, MD;  Location: [place];  Service:  Gastroenterology;  Laterality: N/A;   FLEXIBLE SIGMOIDOSCOPY N/A 04/14/2022   Procedure: FLEXIBLE SIGMOIDOSCOPY;  Surgeon: Mauri Pole, MD;  Location: [place];  Service: Gastroenterology;  Laterality: N/A;   LIGATION OF ARTERIOVENOUS  FISTULA Left 03/06/2019   Procedure: LIGATION OF ARTERIOVENOUS  FISTULA;  Surgeon: Marty Heck, MD;  Location: [facility];  Service: Vascular;  Laterality: Left;   spinal tap     SPINE SURGERY     related to a spinal infection, unsure of surgery or infection source   Stamps EXTRACTION     Patient Active Problem List   Diagnosis Date Noted   Gastritis and gastroduodenitis 06/06/2022   Migraines 06/05/2022   Pancreatitis 06/03/2022   Epigastric abdominal pain 06/03/2022   Abdominal pain 06/03/2022   Rectal bleeding 04/14/2022   Hypertensive urgency 04/13/2022   Acute lower GI bleeding 04/12/2022   Heart failure with mildly reduced ejection fraction (HFmrEF) ([place]) 04/08/2022   Bilateral shoulder pain 04/08/2022   Acute otitis externa of both ears 04/08/2022   Pneumonia of right lower lobe due to infectious organism    Dyspnea 03/02/2022   Chest pain 03/02/2022   Leg pain, bilateral 01/03/2022   Bilateral calf pain    Housing insecurity 01/01/2022   Food insecurity 01/01/2022   Non-compliance with renal dialysis    Hearing loss 10/08/2021   Bloody diarrhea 06/10/2021  Family history of diabetes mellitus 02/11/2021   Hypoglycemic syndrome 02/11/2021   Rash and nonspecific skin eruption 12/16/2019   Rash on scrotum 12/16/2019   Hypertension 06/07/2018   ESRD (end stage renal disease) ([place]) 06/07/2018   Alport syndrome 07/14/2017   GERD (gastroesophageal reflux disease) 07/14/2017   History of appendectomy 07/14/2017   Obesity (BMI 30.0-34.9) 07/14/2017   Pre-transplant evaluation for kidney transplant 07/14/2017   Secondary hyperparathyroidism ([place]) 07/14/2017   Tobacco use 07/14/2017   Bipolar 1 disorder ([place]) 09/01/2013    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    PCP: [name], DO  REFERRING PROVIDER: Martyn Malay, MD  THERAPY DIAG:  Left shoulder pain, unspecified chronicity - Plan: PT plan of care cert/re-cert  Right shoulder pain, unspecified chronicity - Plan: PT plan of care cert/re-cert  Muscle weakness - Plan: PT plan of care cert/re-cert  Localized edema - Plan: PT plan of care cert/re-cert  REFERRING DIAG:  Bilateral shoulder pain, unspecified chronicity [M25.511, M25.512]   Rationale for Evaluation and Treatment:  Rehabilitation  SUBJECTIVE:  PERTINENT PAST HISTORY:  Dialysis (T, Th, Fri/sat), alpot syndrome, heart failure, bipolar 1, hard of hearing (reads lips), can be SOB when recumbent      PRECAUTIONS: None  WEIGHT BEARING RESTRICTIONS No  FALLS:  Has patient fallen in last 6 months? No, Number of falls: 0  MOI/History of condition:  Onset date: Multiple Years  SUBJECTIVE STATEMENT  [name] is a 37 y.o. male who presents to clinic with chief complaint of L shoulder pain.  There was no acute injury or trauma.  The pain is slowly getting worse.  From referring provider:   "HPI:  Very pleasant 85 male comes to clinic to follow-up left shoulder pain.  He says the subacromial injection we did at his last visit only gave him maybe a week or 2 of pain relief.  He is still experiencing significant pain over the anterior and lateral aspects of the shoulder, especially with overhead movements.  This made his home exercises that he was doing very painful.  He feels "a lot of popping" and instability of the joint.  His x-rays of left shoulder showed no acute bony abnormalities.    IMPRESSION: 1. Mild tendinosis of the supraspinatus and infraspinatus tendons. 2. Severe arthropathy of the acromioclavicular joint."   Red flags:  denies   Pain:  Are you having pain? Yes Pain location: anterior L shoulder NPRS scale:  5/10 to 8/10 Aggravating factors:  shoulder movements, particularly OH Relieving factors: rest Pain description: constant and aching Stage: Chronic Stability: staying the same 24 hour pattern: worse at night   Occupation: maintenance man  Assistive Device: NA  Hand Dominance: R handed  Patient Goals/Specific Activities: reduce pain   OBJECTIVE:    DIAGNOSTIC FINDINGS:  IMPRESSION: 1. Mild tendinosis of the supraspinatus and infraspinatus tendons. 2. Severe arthropathy of the acromioclavicular joint.  GENERAL OBSERVATION:  Slight rounded shoulders     SENSATION:  Light touch: Appears intact   PALPATION: TTP L AC joint  UPPER EXTREMITY AROM:  ROM Right 07/09/2022 Left 07/09/2022  Shoulder flexion 140 125*  Shoulder abduction 135 90*  Shoulder internal rotation    Shoulder external rotation    Functional IR WNL WFL*  Functional ER WNL WFL *  Shoulder extension    Elbow extension    Elbow flexion     (Blank rows = not tested, N = WNL, * = concordant pain with testing)  UPPER EXTREMITY MMT:  MMT  Right 07/09/2022 Left 07/09/2022  Shoulder flexion 4+ 3**  Shoulder abduction (C5) 4+ 3**  Shoulder ER 4 4*  Shoulder IR 4 4  Middle trapezius    Lower trapezius    Shoulder extension    Grip strength    Cervical flexion (C1,C2)    Cervical S/B (C3)    Shoulder shrug (C4)    Elbow flexion (C6)    Elbow ext (C7)    Thumb ext (C8)    Finger abd (T1)    Grossly     (Blank rows = not tested, score listed is out of 5 possible points.  N = WNL, D = diminished, C = clear for gross weakness with myotome testing, * = concordant pain with testing)   MUSCLE LENGTH:    Pec Major: L (-), R (-) for restriction  UPPER EXTREMITY PROM:  PROM Right 07/09/2022 Left 07/09/2022  Shoulder flexion WNL WNL  Shoulder abduction    Shoulder internal rotation    Shoulder external rotation    Functional IR    Functional ER    Shoulder extension    Elbow extension    Elbow flexion     (Blank rows = not  tested, N = WNL, * = concordant pain with testing)  SHOULDER SPECIAL TESTS:  AC X over (+)  R/C test cluster (+)  JOINT MOBILITY TESTING:  Hypomobile AC joint  PATIENT SURVEYS:  FOTO 57 -> 73   TODAY'S TREATMENT:  Creating, reviewing, and completing below HEP   PATIENT EDUCATION:  POC, diagnosis, prognosis, HEP, and outcome measures.  Pt educated via explanation, demonstration, and handout (HEP).  Pt confirms understanding verbally.   HOME EXERCISE PROGRAM: Access Code: [email or web address] URL: [email or web address] Date: 07/09/2022 Prepared by: Shearon Balo  Exercises - Sidelying Shoulder External Rotation  - 2 x daily - 7 x weekly - 3 sets - 10 reps  Treatment priorities   Eval (07/09/2022)        AC joint mobs        R/C strengthening                                  ASSESSMENT:  CLINICAL IMPRESSION: [name] is a 37 y.o. male who presents to clinic with signs and sxs consistent with L > R shoulder pain secondary to r/c pathology and AC arthropathy.  Consistent with MRI.    OBJECTIVE IMPAIRMENTS: Pain, L shoulder ROM, L shoulder strength  ACTIVITY LIMITATIONS: reaching OH, recreation, work  PERSONAL FACTORS: See medical history and pertinent history   REHAB POTENTIAL: Good  CLINICAL DECISION MAKING: Stable/uncomplicated  EVALUATION COMPLEXITY: Low   GOALS:   SHORT TERM GOALS: Target date: 08/06/2022  [name] will be >75% HEP compliant to improve carryover between sessions and facilitate independent management of condition  Evaluation (07/09/2022): ongoing Goal status: INITIAL   LONG TERM GOALS: Target date: 09/03/2022  [name] will improve FOTO score to 73 as a proxy for functional improvement  Evaluation/Baseline (07/09/2022): 57 Goal status: INITIAL   2.  [name] will self report >/= 50% decrease in pain from evaluation   Evaluation/Baseline (07/09/2022): 8/10 max pain Goal status: INITIAL   3.  [name] will demonstrate >130  degrees of active ROM in flexion to allow completion of activities involving reaching OH, not limited by pain  Evaluation/Baseline (07/09/2022): 90 degrees Goal status: INITIAL    4.  [name] will be able to to work, not limited by pain  Evaluation/Baseline (07/09/2022): limited Goal status: INITIAL   PLAN: PT FREQUENCY: 1-2x/week  PT DURATION: 8 weeks (Ending 09/03/2022)  PLANNED INTERVENTIONS: Therapeutic exercises, Aquatic therapy, Therapeutic activity, Neuro Muscular re-education, Gait training, Patient/Family education, Joint mobilization, Dry Needling, Electrical stimulation, Spinal mobilization and/or manipulation, Moist heat, Taping, Vasopneumatic device, Ionotophoresis '4mg'$ /ml Dexamethasone, and Manual therapy  PLAN FOR NEXT SESSION: AC joint mobs and R/C strengthening   Shearon Balo PT, DPT 07/09/2022, 1:45 PM

## 2022-07-16 ENCOUNTER — Ambulatory Visit (INDEPENDENT_AMBULATORY_CARE_PROVIDER_SITE_OTHER): Payer: Medicare Other | Admitting: Sports Medicine

## 2022-07-16 VITALS — BP 192/80 | Ht 68.0 in | Wt 170.0 lb

## 2022-07-16 DIAGNOSIS — G8929 Other chronic pain: Secondary | ICD-10-CM | POA: Diagnosis not present

## 2022-07-16 DIAGNOSIS — M25512 Pain in left shoulder: Secondary | ICD-10-CM | POA: Diagnosis not present

## 2022-07-16 MED ORDER — METHYLPREDNISOLONE ACETATE 40 MG/ML IJ SUSP
40.0000 mg | Freq: Once | INTRAMUSCULAR | Status: AC
  Administered 2022-07-16: 40 mg via INTRA_ARTICULAR

## 2022-07-16 NOTE — Progress Notes (Unsigned)
  [name] - 37 y.o. male MRN [id number]  Date of birth: August 01, 1985    CHIEF COMPLAINT:   Left shoulder pain follow up    SUBJECTIVE:   HPI:  Pleasant 37 year old male comes to clinic to follow-up left shoulder pain.  He had an MRI done that showed mild tendinosis of the supra and infra tendons.  It also showed severe arthropathy of the [facility] joint.  I asked him to come in today to discuss treatment options.  He is doing about the same.  He still having pain with any overhead movements of the shoulder.  Since his last visit I discussed his case with Dr. Percell [name] at [place].  He suggested I try a ultrasound-guided AC joint injection.  If no improvement then he offered to evaluate the patient for distal clavicle excision.  ROS:     See HPI  PERTINENT  PMH / PSH FH / / SH:  Past Medical, Surgical, Social, and Family History Reviewed & Updated in the EMR.  Pertinent findings include:  none  OBJECTIVE: BP (!) 192/90   Ht '5\' 8"'$  (1.727 m)   Wt 170 lb (77.1 kg)   BMI 25.85 kg/m   Physical Exam:  Vital signs are reviewed.  GEN: Alert and oriented, NAD Pulm: Breathing unlabored PSY: normal mood, congruent affect  MSK: L shoulder -tender to palpation at the [facility] joint.  Range of motion active abduction and forward flexion to 90 degrees.  External rotation to 60 degrees.  Internal rotation to lower lumbar spine.  Positive painful arc.  Patient was reexamined after the procedure and had decreased tenderness to palpation at the [facility] joint and less painful range of motion in abduction and forward flexion.  ASSESSMENT & PLAN:  1.  AC joint arthropathy  -Discussed treatment options with the patient today.  I recommended we start with a ultrasound-guided AC joint injection to try to give him some pain relief.  After discussion of risk/benefits he would like to proceed with this.  See procedure note for details.  Will check on this in about 4 weeks.  If no improvement I will have  him see Dr. Percell [name] at [facility] orthopedics to discuss distal clavicle excision.  All questions answered and agrees to plan.   Procedure performed: [facility] joint corticosteroid injection; [place] assisted Performed by: Edward Drum, MD Procedure note:  Consent obtained and verified. Time-out conducted. Noted no overlying erythema, induration, or other signs of local infection. The left [facility] Joint was identified with ultrasound. The over overlying skin was prepped prepped in a sterile fashion. Topical analgesic spray: Ethyl chloride. Joint: Left AC Joint Needle: 25G 1.5 in Meds: 1cc 1% lidocaine with 1cc '40mg'$  Depo-medrol  Using an out of plane approach the needle was inserted into the [facility] joint from an anterior approach under ultrasound guidance.   Completed without difficulty.  Advised to call if fevers/chills, erythema, induration, drainage, or persistent bleeding.     Edward Kern, MD PGY-4, Sports Medicine Fellow [place]  Addendum:  I was the preceptor for this visit and available for immediate consultation.  Edward Lemon MD [name]

## 2022-07-22 ENCOUNTER — Ambulatory Visit: Payer: Medicare Other | Attending: Family Medicine

## 2022-07-22 ENCOUNTER — Telehealth: Payer: Self-pay

## 2022-07-22 NOTE — Telephone Encounter (Signed)
LVM regarding missed visit today, confirmed next appointment time and reviewed clinic attendance policy.   Margarette Canada, PTA 07/22/22 3:19 PM

## 2022-07-25 ENCOUNTER — Telehealth: Payer: Self-pay | Admitting: Physical Therapy

## 2022-07-25 ENCOUNTER — Ambulatory Visit: Payer: Medicare Other | Admitting: Physical Therapy

## 2022-07-25 ENCOUNTER — Encounter: Payer: Self-pay | Admitting: Physical Therapy

## 2022-07-25 NOTE — Telephone Encounter (Signed)
Attempted call but number is not in service. Canceling remaining appts d/t 2 n/s.  Pt may schedule 1 visit at time 1x/week moving forward.

## 2022-07-29 ENCOUNTER — Ambulatory Visit: Payer: Medicare Other | Admitting: Physical Therapy

## 2022-07-31 ENCOUNTER — Ambulatory Visit: Payer: Medicare Other | Admitting: Physical Therapy

## 2022-08-04 ENCOUNTER — Telehealth: Payer: Self-pay | Admitting: Family Medicine

## 2022-08-04 NOTE — Telephone Encounter (Signed)
Called patient to schedule Medicare Annual Wellness Visit (AWV). No voicemail available to leave a message.  Last date of AWV: 04/11/2010   Please schedule an AWVI appointment at any time with [place].  If any questions, please contact me at [phone].   Thank you,  [place] Direct dial  [phone]

## 2022-08-05 ENCOUNTER — Telehealth: Payer: Self-pay | Admitting: Pharmacist

## 2022-08-05 ENCOUNTER — Ambulatory Visit: Payer: Medicare Other | Attending: Cardiology | Admitting: Pharmacist

## 2022-08-05 ENCOUNTER — Other Ambulatory Visit: Payer: Self-pay | Admitting: Family Medicine

## 2022-08-05 VITALS — BP 168/90 | HR 86

## 2022-08-05 DIAGNOSIS — I10 Essential (primary) hypertension: Secondary | ICD-10-CM | POA: Diagnosis not present

## 2022-08-05 MED ORDER — CLONIDINE HCL 0.1 MG PO TABS: 0.1000 mg | ORAL_TABLET | Freq: Two times a day (BID) | ORAL | 3 refills | Status: DC

## 2022-08-05 MED ORDER — SUMATRIPTAN SUCCINATE 50 MG PO TABS: 50.0000 mg | ORAL_TABLET | ORAL | 0 refills | Status: DC | PRN

## 2022-08-05 MED ORDER — DIVALPROEX SODIUM ER 500 MG PO TB24: 500.0000 mg | ORAL_TABLET | Freq: Every day | ORAL | 2 refills | Status: DC

## 2022-08-05 NOTE — Patient Instructions (Signed)
Summary of today's discussion  1.Start clonidine 0.1mg  twice a day  2. Continue  amlodipine 10mg , carvedilol 25mg , hyralazine 100mg  TID, losartan 50mg    3.check blood pressure at home and bring in readings and the machine  4.  5.   Your blood pressure goal is <130/80  To check your pressure at home you will need to:  1. Sit up in a chair, with feet flat on the floor and back supported. Do not cross your ankles or legs. 2. Rest your left arm so that the cuff is about heart level. If the cuff goes on your upper arm,  then just relax the arm on the table, arm of the chair or your lap. If you have a wrist cuff, we  suggest relaxing your wrist against your chest (think of it as Pledging the Flag with the  wrong arm).  3. Place the cuff snugly around your arm, about 1 inch above the crook of your elbow. The  cords should be inside the groove of your elbow.  4. Sit quietly, with the cuff in place, for about 5 minutes. After that 5 minutes press the power  button to start a reading. 5. Do not talk or move while the reading is taking place.  6. Record your readings on a sheet of paper. Although most cuffs have a memory, it is often  easier to see a pattern developing when the numbers are all in front of you.  7. You can repeat the reading after 1-3 minutes if it is recommended  Make sure your bladder is empty and you have not had caffeine or tobacco within the last 30 min  Always bring your blood pressure log with you to your appointments. If you have not brought your monitor in to be double checked for accuracy, please bring it to your next appointment.  You can find a list of validated (accurate) blood pressure cuffs at [employer]   Important lifestyle changes to control high blood pressure  Intervention  Effect on the BP  Lose extra pounds and watch your waistline Weight loss is one of the most effective lifestyle changes for controlling blood pressure. If you're overweight or  obese, losing even a small amount of weight can help reduce blood pressure. Blood pressure might go down by about 1 millimeter of mercury (mm Hg) with each kilogram (about 2.2 pounds) of weight lost.  Exercise regularly As a general goal, aim for at least 30 minutes of moderate physical activity every day. Regular physical activity can lower high blood pressure by about 5 to 8 mm Hg.  Eat a healthy diet Eating a diet rich in whole grains, fruits, vegetables, and low-fat dairy products and low in saturated fat and cholesterol. A healthy diet can lower high blood pressure by up to 11 mm Hg.  Reduce salt (sodium) in your diet Even a small reduction of sodium in the diet can improve heart health and reduce high blood pressure by about 5 to 6 mm Hg.  Limit alcohol One drink equals 12 ounces of beer, 5 ounces of wine, or 1.5 ounces of 80-proof liquor.  Limiting alcohol to less than one drink a day for women or two drinks a day for men can help lower blood pressure by about 4 mm Hg.   Please call me at [phone] with any questions.

## 2022-08-05 NOTE — Telephone Encounter (Signed)
Patient needs refills on his divalproex and sumatriptan

## 2022-08-05 NOTE — Assessment & Plan Note (Addendum)
Assessment:  Patient reports adherence to amlodipine 10mg , carvedilol 25mg  BID, hyralazine 100mg  TID, and losartan 50mg  Patient had not taken any BP medications today. BP in clinic: 168/90. On dialysis days (T,TH,S) patient reports taking medications after dialysis. On non-dialysis days, he reports taking medications in the morning.  Patient reports chest pain with exertion, headaches, and numbness around mouth and lips.  Patient does not check BP at home often Patient reports smoking cigarettes and using marijuana   Plan:  Start clonidine 0.1mg  BID  Continue amlodipine 10mg  daily, carvedilol 25mg  BID, hyralazine 100mg  TID, and losartan 50mg  daily Encouraged patient to check BP at home at bring readings and BP cuff to next visit  Encouraged tobacco and marijuana cessation for CV risk reduction and potential kidney transplant  Follow-up Monday, April 8th at 11:00 AM

## 2022-08-05 NOTE — Progress Notes (Signed)
Patient ID: [name]                 DOB: 1985/06/03                      MRN: [id number]      HPI: [name] is a 37 y.o. male referred by Dr. Irish Lack to HTN clinic. PMH is significant for HTN, HFrEF, Alport syndrome, ESRD on HD, bipolar 1 disorder   At visit on 05/09/2022 with [name], NP, patient's hydralazine was increased to 100mg  TID, carvedilol increased to 25 mg BID  At visit with Dr. [name] on 06/16/2022, patient's losartan had previously been restarted 06/14/2022 following hospitalization regarding hemodialysis fistula. Isosorbide mononitrate was discontinued following hospitalization 06/04/2022 for potential migraine triggers.   At today's visit, patient reports adherence to all medications. He has not taken any BP medications today. Typically on Tuesday, Thursday and Saturday, he takes his BP medications after dialysis. Patient reports rushing to get to this appointment, so he has not taken them yet. On days he does not have dialysis, he reports taking BP medications first thing in the morning. Patient reports having chest pain that has worsened over the past 2-3 weeks. He describes it as a sharp pain that comes and goes with exertion. Patient reports frequent headaches and a numbness around his mouth and lips. Denies blurry vision, lightheadedness, dizziness. Patient reports he used to check his BP at home, but does not anymore due to consistently seeing high values [id number] systolic. Patient reports smoking cigarettes again due to life stressors. He also uses marijuana every day. Patient was unaware marijuana can negatively impact BP.   Current HTN meds: amlodipine 10mg , carvedilol 25mg  BID, hyralazine 100mg  TID, losartan 50mg   Previously tried: lisinopril 5mg  (swelling), isosorbide mononitrate 30mg  (headaches) BP goal: < 130/80  Family History: The patient's family history includes Asthma in his son; Diabetes in his mother; Hearing loss in his father; Heart disease in his  maternal grandmother; Hypertension in his mother and sister; Kidney failure in his mother; Stroke in his maternal grandfather.   Social History:  The patient  reports that he has been smoking cigarettes. He has been smoking an average of .3 packs per day. He has never used smokeless tobacco. He reports that he does not currently use alcohol. He reports current drug use. Drug: Marijuana.   Diet:  His family makes meals for him. Bacon, cheese biscuit for breakfast.  Drinks: water, ice, sometimes juice   Exercise: limited as of now, going to start back going to the gym   Home BP readings: [id number] systolic    Wt Readings from Last 3 Encounters:  07/16/22 170 lb (77.1 kg)  06/21/22 169 lb (76.7 kg)  06/16/22 169 lb 9.6 oz (76.9 kg)   BP Readings from Last 3 Encounters:  08/05/22 (!) 168/90  07/16/22 (!) 192/80  06/21/22 (!) 168/105   Pulse Readings from Last 3 Encounters:  08/05/22 86  06/21/22 (!) 111  06/16/22 96    Renal function: CrCl cannot be calculated (Patient's most recent lab result is older than the maximum 21 days allowed.).  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 (Patient not taking: Reported on 06/16/2022)     amLODipine (NORVASC) 10 MG tablet TAKE 1 TABLET BY MOUTH EVERY DAY 90 tablet 3   calcitRIOL (ROCALTROL) 0.25 MCG capsule Take 0.25 mcg by mouth daily. (Patient not taking: Reported on 06/16/2022)     calcium acetate (PHOSLO) 667 MG capsule Take 1 capsule  (667 mg total) by mouth 3 (three) times daily with meals. (Patient taking differently: Take 2,001 mg by mouth 3 (three) times daily with meals.) 100 capsule 0   carvedilol (COREG) 25 MG tablet Take 1 tablet (25 mg total) by mouth 2 (two) times daily. 180 tablet 1   divalproex (DEPAKOTE ER) 500 MG 24 hr tablet Take 1 tablet (500 mg total) by mouth at bedtime. 30 tablet 0   hydrALAZINE (APRESOLINE) 100 MG tablet Take 1 tablet (100 mg total) by mouth 3 (three) times daily. 270 tablet 1   hydrocortisone-pramoxine (ANALPRAM-HC) 2.5-1 % rectal cream Place rectally 2 (two) times daily. 30 g 0   losartan (COZAAR) 50 MG tablet Take 1 tablet (50 mg total) by mouth daily. 30 tablet 0   oxyCODONE (ROXICODONE) 5 MG immediate release tablet Take 1 tablet (5 mg total) by mouth every 6 (six) hours as needed for severe pain. 7 tablet 0   pantoprazole (PROTONIX) 40 MG tablet Take 1 tablet (40 mg total) by mouth 2 (two) times daily. 60 tablet 0   SUMAtriptan (IMITREX) 50 MG tablet Take 1 tablet (50 mg total) by mouth every 2 (two) hours as needed for migraine. Do not take more than 4 tablets in 24 hours. 20 tablet 0   triamcinolone ointment (KENALOG) 0.1 % Apply to scrotum twice daily for itching. Do not use for more than 2 weeks continuously 30 g 1   No current facility-administered medications on file prior to visit.    Allergies  Allergen Reactions   Zestril [Lisinopril] Swelling   Nsaids Other (See Comments)    "Kidney problems "   Risperdal [Risperidone] Other (See Comments)    Unknown reaction     Blood pressure (!) 168/90, pulse 86, SpO2 96 %.   Assessment/Plan:  1. Hypertension -  Hypertension Assessment:  Patient reports adherence to amlodipine 10mg , carvedilol 25mg  BID, hyralazine 100mg  TID, and losartan 50mg  Patient had not taken any BP medications today. BP in clinic: 168/90. On dialysis days (T,TH,S) patient reports taking medications after dialysis. On non-dialysis days, he reports taking  medications in the morning.  Patient reports chest pain with exertion, headaches, and numbness around mouth and lips.  Patient does not check BP at home often Patient reports smoking cigarettes and using marijuana   Plan:  Start clonidine 0.1mg  BID  Continue amlodipine 10mg  daily, carvedilol 25mg  BID, hyralazine 100mg  TID, and losartan 50mg  daily Encouraged patient to check BP at home at bring readings and BP cuff to next visit  Encouraged tobacco and marijuana cessation for CV risk reduction and potential kidney transplant  Follow-up Monday, April 8th at 11:00 AM   Thank you,   Candiss Norse PharmD Candidate Class of 2024  [place], [place].D, BCPS, CPP [place]  A Division of [facility] [address] [place], [place] [address]  Phone: [phone]; Fax: [phone]

## 2022-08-06 ENCOUNTER — Telehealth: Payer: Self-pay | Admitting: Pharmacist

## 2022-08-06 ENCOUNTER — Ambulatory Visit: Payer: Medicare Other | Admitting: Physical Therapy

## 2022-08-06 NOTE — Telephone Encounter (Signed)
-----   Message from Jettie Booze, MD sent at 08/05/2022  5:35 PM EDT ----- We have not done any ischemic eval thinking this was from uncontrolled BP.  I would just work on the BP and if he still has sx when BP around [id number] systolic, we could do a myoview.  JV ----- Message ----- From: Ramond Dial, RPH-CPP Sent: 08/05/2022   4:00 PM EDT To: Jettie Booze, MD  Patient reports having chest pain that has worsened over the past 2-3 weeks. He describes it as a sharp pain that comes and goes with exertion. Patient reports frequent headaches and a numbness around his mouth and lips. Isosorbide mononitrate was discontinued following hospitalization 06/04/2022 for potential migraine triggers. What do you think? Start ranolazine?

## 2022-08-06 NOTE — Telephone Encounter (Signed)
Will continue working on patient's blood pressure control.

## 2022-08-18 ENCOUNTER — Ambulatory Visit: Payer: Medicare Other | Attending: Family Medicine

## 2022-08-18 NOTE — Progress Notes (Unsigned)
Patient ID: [name]                 DOB: Jun 25, 1985                      MRN: [id number]     HPI: [name] is a 37 y.o. male referred by Dr. Eldridge Dace to HTN clinic. 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 At last visit with HTN clinic, patient reported home SBP readings in the 200s and worsening sharp chest pain. In-office BP reading was 168/90 mmHg. Clonidine 0.1 mg TID was initiated and the rest of his regimen (listed below) was continued. Patient noted that he has recently started smoking cigarettes again due to stress and he uses marijuana daily.   Current HTN meds: amlodipine 10 mg daily, losartan 50 mg daily, carvedilol 25 mg BID, hydralazine 100 mg TID. Patient takes all meds after dialysis on dialysis days (T, Th, S) and in the AM on non-dialysis days. Previously tried: isosorbide mononitrate (migraines), lisinopril (swelling)  BP goal: < 130/80 mmHg  Family History: Mother - diabetes, HTN, kidney failure; father - CV disease, hearing loss; maternal grandfather - stroke; maternal grandmother - CV disease; sister - HTN.  Social History: Per last visit, smoking cigarettes at ~3 packs per day. No alcohol use, but does use marijuana daily.  Diet:   Exercise:   Home BP readings:   Wt Readings from Last 3 Encounters:  07/16/22 170 lb (77.1 kg)  06/21/22 169 lb (76.7 kg)  06/16/22 169 lb 9.6 oz (76.9 kg)   BP Readings from Last 3 Encounters:  08/05/22 (!) 168/90  07/16/22 (!) 192/80  06/21/22 (!) 168/105   Pulse Readings from Last 3 Encounters:  08/05/22 86  06/21/22 (!) 111  06/16/22 96    Renal function: CrCl cannot be calculated (Patient's most recent lab result is older than the maximum 21 days allowed.).  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 (Patient not taking: Reported on 06/16/2022)     amLODipine (NORVASC) 10 MG tablet TAKE 1 TABLET BY MOUTH EVERY DAY 90 tablet 3   calcitRIOL (ROCALTROL) 0.25 MCG capsule Take 0.25 mcg by mouth daily. (Patient not taking: Reported on 06/16/2022)     calcium acetate (PHOSLO) 667 MG capsule Take 1 capsule (667 mg total) by mouth 3 (three) times daily with meals. (Patient taking differently: Take 2,001 mg by mouth 3 (three) times daily with meals.) 100 capsule 0   carvedilol (COREG) 25 MG tablet Take 1 tablet (25 mg total) by mouth 2 (two) times daily. 180 tablet 1   cloNIDine (CATAPRES) 0.1 MG tablet Take 1 tablet (0.1 mg total) by mouth 2 (two) times daily. 180 tablet 3   divalproex (DEPAKOTE ER) 500 MG 24 hr tablet Take  1 tablet (500 mg total) by mouth at bedtime. 30 tablet 2   hydrALAZINE (APRESOLINE) 100 MG tablet Take 1 tablet (100 mg total) by mouth 3 (three) times daily. 270 tablet 1   hydrocortisone-pramoxine (ANALPRAM-HC) 2.5-1 % rectal cream Place rectally 2 (two) times daily. 30 g 0   losartan (COZAAR) 50 MG tablet Take 1 tablet (50 mg total) by mouth daily. 30 tablet 0   oxyCODONE (ROXICODONE) 5 MG immediate release tablet Take 1 tablet (5 mg total) by mouth every 6 (six) hours as needed for severe pain. 7 tablet 0   pantoprazole (PROTONIX) 40 MG tablet Take 1 tablet (40 mg total) by mouth 2 (two) times daily. 60 tablet 0   SUMAtriptan (IMITREX) 50 MG tablet Take 1 tablet (50 mg total) by mouth every 2 (two) hours as needed for migraine. Do not take more than 4 tablets in 24 hours. 20 tablet 0   triamcinolone ointment (KENALOG) 0.1 %  Apply to scrotum twice daily for itching. Do not use for more than 2 weeks continuously 30 g 1   No current facility-administered medications on file prior to visit.    Allergies  Allergen Reactions   Zestril [Lisinopril] Swelling   Nsaids Other (See Comments)    "Kidney problems "   Risperdal [Risperidone] Other (See Comments)    Unknown reaction      Assessment/Plan:  1. Hypertension -

## 2022-09-01 ENCOUNTER — Ambulatory Visit: Payer: Medicare Other | Admitting: Family Medicine

## 2022-09-01 NOTE — Progress Notes (Deleted)
    SUBJECTIVE:   Chief compliant/HPI: annual examination  [name] is a 37 y.o. who presents today for an annual exam.   Reviewed and updated history ***.   Review of systems form notable for ***.    OBJECTIVE:   There were no vitals taken for this visit.  ***  ASSESSMENT/PLAN:   No problem-specific Assessment & Plan notes found for this encounter.    Annual Examination  See AVS for age appropriate recommendations  PHQ score ***, reviewed and discussed.  Blood pressure reviewed and at goal. ***   Advanced directive ***   Considered the following items based upon USPSTF recommendations: HIV testing: {not indicated/requested/declined:14582} Hepatitis C: {not indicated/requested/declined:14582} Hepatitis B: {not indicated/requested/declined:14582} Syphilis if at high risk: {{not indicated/requested/declined:14582} GC/CT{not indicated/requested/declined:14582} Lipid panel (nonfasting or fasting) discussed based upon AHA recommendations and {ordered not order:23822}.  Consider repeat every 4-6 years.  Reviewed risk factors for latent tuberculosis and {not indicated/requested/declined:14582} Immunizations ***   Follow up in 1 year or sooner if indicated.    Sabino Dick, DO [facility]

## 2022-09-01 NOTE — Progress Notes (Deleted)
    SUBJECTIVE:   Chief compliant/HPI: annual examination  Muhsin L Delmonaco is a 37 y.o. who presents today for an annual exam.   Reviewed and updated history ***.   Review of systems form notable for ***.    OBJECTIVE:   There were no vitals taken for this visit.  ***  ASSESSMENT/PLAN:   No problem-specific Assessment & Plan notes found for this encounter.    Annual Examination  See AVS for age appropriate recommendations  PHQ score ***, reviewed and discussed.  Blood pressure reviewed and at goal. ***   Advanced directive ***   Considered the following items based upon USPSTF recommendations: HIV testing: {not indicated/requested/declined:14582} Hepatitis C: {not indicated/requested/declined:14582} Hepatitis B: {not indicated/requested/declined:14582} Syphilis if at high risk: {{not indicated/requested/declined:14582} GC/CT{not indicated/requested/declined:14582} Lipid panel (nonfasting or fasting) discussed based upon AHA recommendations and {ordered not order:23822}.  Consider repeat every 4-6 years.  Reviewed risk factors for latent tuberculosis and {not indicated/requested/declined:14582} Immunizations ***   Follow up in 1 year or sooner if indicated.    Sedrick Tober, DO [facility]  

## 2022-09-02 ENCOUNTER — Ambulatory Visit: Payer: Medicare Other | Admitting: Family Medicine

## 2022-09-12 ENCOUNTER — Telehealth (HOSPITAL_COMMUNITY): Payer: Self-pay | Admitting: *Deleted

## 2022-09-12 NOTE — Telephone Encounter (Signed)
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  Will give to [facility].

## 2022-09-16 ENCOUNTER — Emergency Department (HOSPITAL_COMMUNITY)
Admission: EM | Admit: 2022-09-16 | Discharge: 2022-09-17 | Disposition: A | Discharge status: Home / Self Care | Payer: Medicare Other | Attending: Emergency Medicine | Admitting: Emergency Medicine

## 2022-09-16 ENCOUNTER — Other Ambulatory Visit: Payer: Self-pay | Admitting: Family Medicine

## 2022-09-16 ENCOUNTER — Encounter (HOSPITAL_COMMUNITY): Payer: Self-pay

## 2022-09-16 ENCOUNTER — Other Ambulatory Visit: Payer: Self-pay

## 2022-09-16 DIAGNOSIS — R519 Headache, unspecified: Secondary | ICD-10-CM | POA: Insufficient documentation

## 2022-09-16 DIAGNOSIS — R739 Hyperglycemia, unspecified: Secondary | ICD-10-CM | POA: Diagnosis not present

## 2022-09-16 DIAGNOSIS — Z992 Dependence on renal dialysis: Secondary | ICD-10-CM | POA: Diagnosis not present

## 2022-09-16 DIAGNOSIS — J039 Acute tonsillitis, unspecified: Secondary | ICD-10-CM | POA: Diagnosis not present

## 2022-09-16 DIAGNOSIS — D649 Anemia, unspecified: Secondary | ICD-10-CM | POA: Insufficient documentation

## 2022-09-16 DIAGNOSIS — N186 End stage renal disease: Secondary | ICD-10-CM | POA: Insufficient documentation

## 2022-09-16 DIAGNOSIS — Z79899 Other long term (current) drug therapy: Secondary | ICD-10-CM | POA: Diagnosis not present

## 2022-09-16 DIAGNOSIS — Z7982 Long term (current) use of aspirin: Secondary | ICD-10-CM | POA: Diagnosis not present

## 2022-09-16 DIAGNOSIS — R110 Nausea: Secondary | ICD-10-CM | POA: Diagnosis not present

## 2022-09-16 DIAGNOSIS — D72819 Decreased white blood cell count, unspecified: Secondary | ICD-10-CM | POA: Insufficient documentation

## 2022-09-16 DIAGNOSIS — R6884 Jaw pain: Secondary | ICD-10-CM | POA: Diagnosis present

## 2022-09-16 DIAGNOSIS — I5020 Unspecified systolic (congestive) heart failure: Secondary | ICD-10-CM | POA: Insufficient documentation

## 2022-09-16 MED ORDER — DEXAMETHASONE 10 MG/ML FOR PEDIATRIC ORAL USE: 6.0000 mg | Freq: Once | INTRAMUSCULAR | Status: AC

## 2022-09-16 MED ORDER — OXYCODONE-ACETAMINOPHEN 5-325 MG PO TABS
1.0000 | ORAL_TABLET | Freq: Once | ORAL | Status: AC
  Administered 2022-09-17: 1 via ORAL
  Filled 2022-09-16: qty 1

## 2022-09-16 NOTE — ED Provider Notes (Signed)
[place] [facility] AT [facility] Provider Note   CSN: [id number] Arrival date & time: 09/16/22  2054     History {Add pertinent medical, surgical, social history, OB history to HPI:1} Chief Complaint  Patient presents with   Otalgia    [name] is a 37 y.o. male.  HPI   Patient with medical history including bipolar, end-stage renal disease on dialysis Tuesday Thursday Saturday, congestive heart failure with reduced ejection fraction, Alport syndrome presenting with complaints of right-sided ear pain/jaw pain/neck pain.  States that started 2 days ago, came on suddenly, states that initially was having drainage coming out of his ear but is since resolved, he states that there is a pimple on the inside of his ear and believes that this might of came from a bug bite.  But now the pain is gotten worse, states he feels into his right jaw and goes into the right part of his neck, states he has difficulty swallowing, no shortness of breath, no associated fevers chills cough congestion, never had this in the past.  States that he did go to dialysis today, got his treatment.   Home Medications Prior to Admission medications   Medication Sig Start Date End Date Taking? Authorizing Provider  acetaminophen (TYLENOL) 325 MG tablet Take 2 tablets (650 mg total) by mouth every 6 (six) hours as needed for mild pain (or Fever >/= 101). Patient not taking: Reported on 06/16/2022 04/14/22   Lockie Mola, MD  amLODipine (NORVASC) 10 MG tablet TAKE 1 TABLET BY MOUTH EVERY DAY 04/10/22   Sabino Dick, DO  calcitRIOL (ROCALTROL) 0.25 MCG capsule Take 0.25 mcg by mouth daily. Patient not taking: Reported on 06/16/2022    [provider]  calcium acetate (PHOSLO) 667 MG capsule Take 1 capsule (667 mg total) by mouth 3 (three) times daily with meals. Patient taking differently: Take 2,001 mg by mouth 3 (three) times daily with meals. 01/04/22   Vonna Drafts, MD   carvedilol (COREG) 25 MG tablet Take 1 tablet (25 mg total) by mouth 2 (two) times daily. 06/16/22   Sabino Dick, DO  cloNIDine (CATAPRES) 0.1 MG tablet Take 1 tablet (0.1 mg total) by mouth 2 (two) times daily. 08/05/22   Corky Crafts, MD  divalproex (DEPAKOTE ER) 500 MG 24 hr tablet Take 1 tablet (500 mg total) by mouth at bedtime. 08/05/22   Sabino Dick, DO  hydrALAZINE (APRESOLINE) 100 MG tablet Take 1 tablet (100 mg total) by mouth 3 (three) times daily. 05/20/22   Gaston Islam., NP  hydrocortisone-pramoxine [facility]) 2.5-1 % rectal cream Place rectally 2 (two) times daily. 04/14/22   Lockie Mola, MD  losartan (COZAAR) 50 MG tablet Take 1 tablet (50 mg total) by mouth daily. 06/06/22   [name], DO  oxyCODONE (ROXICODONE) 5 MG immediate release tablet Take 1 tablet (5 mg total) by mouth every 6 (six) hours as needed for severe pain. 06/21/22   Elayne Snare K, DO  pantoprazole (PROTONIX) 40 MG tablet Take 1 tablet (40 mg total) by mouth 2 (two) times daily. 06/06/22   [name], DO  SUMAtriptan (IMITREX) 50 MG tablet Take 1 tablet (50 mg total) by mouth every 2 (two) hours as needed for migraine. Do not take more than 4 tablets in 24 hours. 08/05/22   Sabino Dick, DO  triamcinolone ointment (KENALOG) 0.1 % Apply to scrotum twice daily for itching. Do not use for more than 2 weeks continuously 05/30/22  Sabino Dick, DO      Allergies    Zestril [lisinopril], Nsaids, and Risperdal [risperidone]    Review of Systems   Review of Systems  Constitutional:  Negative for chills and fever.  HENT:  Positive for ear pain, sore throat and trouble swallowing.   Respiratory:  Negative for shortness of breath.   Cardiovascular:  Negative for chest pain.  Gastrointestinal:  Negative for abdominal pain.  Neurological:  Negative for headaches.    Physical Exam Updated Vital Signs BP (!) 199/121 (BP Location: Left Arm)   Pulse 84   Temp 98.3 F  (36.8 C) (Oral)   Resp 16   Ht 5\' 8"  (1.727 m)   Wt 77.1 kg   SpO2 97%   BMI 25.84 kg/m  Physical Exam Vitals and nursing note reviewed.  Constitutional:      General: He is not in acute distress.    Appearance: He is not ill-appearing.  HENT:     Head: Normocephalic and atraumatic.     Right Ear: Tympanic membrane, ear canal and external ear normal.     Left Ear: Tympanic membrane, ear canal and external ear normal.     Ears:     Comments: No noted ear protrusion no tenderness behind the mastoids, right inner ear had a small papule tender to palpation, no drainage or discharge no fluctuance or induration noted.  Ear canal was patent, no erythema, TM intact, nonbulging, no effusion present.  Left ear canal was patent, no erythema, TM intact, nonbulging no effusions present.    Nose: No congestion.     Mouth/Throat:     Mouth: Mucous membranes are moist.     Pharynx: Oropharynx is clear. No oropharyngeal exudate or posterior oropharyngeal erythema.     Comments: No trismus no torticollis, tongue uvula both midline controlling oral surgical creations right tonsil was slightly larger than left without noted exudates or erythema present, he had no submandibular swelling, no notable muscle tone voice.  Patient has notable tenderness in the right back lower jaw where the wisdom tooth would be, tender to palpation about fluctuance induration noted.  Patient is controlling oral secretions Eyes:     Conjunctiva/sclera: Conjunctivae normal.  Neck:     Comments: No notable erythema or edema of the neck, no torticollis, patient had palpable tenderness along the right side of his neck without any notable fluctuance induration noted. Cardiovascular:     Rate and Rhythm: Normal rate and regular rhythm.     Pulses: Normal pulses.     Heart sounds: No murmur heard.    No friction rub. No gallop.  Pulmonary:     Effort: No respiratory distress.     Breath sounds: No wheezing, rhonchi or rales.   Skin:    General: Skin is warm and dry.     Comments: Right fistula AC good palpable thrill no overlying erythema or edema  Neurological:     Mental Status: [name].  Psychiatric:        Mood and Affect: Mood normal.     ED Results / Procedures / Treatments   Labs (all labs ordered are listed, but only abnormal results are displayed) Labs Reviewed  BASIC METABOLIC PANEL  CBC WITH DIFFERENTIAL/PLATELET  MAGNESIUM    EKG None  Radiology No results found.  Procedures Procedures  {Document cardiac monitor, telemetry assessment procedure when appropriate:1}  Medications Ordered in ED Medications - No data to display  ED Course/ Medical Decision Making/ A&P   {  Click here for ABCD2, HEART and other calculatorsREFRESH Note before signing :1}                          Medical Decision Making Amount and/or Complexity of Data Reviewed Labs: ordered. Radiology: ordered.   This patient presents to the ED for concern of ear throat and neck pain, this involves an extensive number of treatment options, and is a complaint that carries with it a high risk of complications and morbidity.  The differential diagnosis includes mastoiditis, retropharyngeal/peritonsillar abscess, Ludwig angina    Additional history obtained:  Additional history obtained from N/A External records from outside source obtained and reviewed including recent ER notes, family medicine notes, transplant notes   Co morbidities that complicate the patient evaluation  End-stage renal disease, CHF  Social Determinants of Health:  N/A    Lab Tests:  I Ordered, and personally interpreted labs.  The pertinent results include:  ***   Imaging Studies ordered:  I ordered imaging studies including CT soft tissue neck I independently visualized and interpreted imaging which showed *** I agree with the radiologist interpretation   Cardiac Monitoring:  The patient was maintained on a cardiac  monitor.  I personally viewed and interpreted the cardiac monitored which showed an underlying rhythm of: ***   Medicines ordered and prescription drug management:  I ordered medication including steroids I have reviewed the patients home medicines and have made adjustments as needed  Critical Interventions:  ***   Reevaluation:  Presenting with complaints of ear neck and throat pain, on my exam I can only find a small papule on the inside of his ear and noticed some tenderness along his gums in the right back lower molar, but patient stating that he is having difficulty swallowing, and the outside of his right neck is very tender to palpation, possibly could be deep tissue infection, will obtain CT imaging for further evaluation.    Consultations Obtained:  I requested consultation with the ***,  and discussed lab and imaging findings as well as pertinent plan - they recommend: ***    Test Considered:  ***    Rule out ****    Dispostion and problem list  After consideration of the diagnostic results and the patients response to treatment, I feel that the patent would benefit from ***.       {Document critical care time when appropriate:1} {Document review of labs and clinical decision tools ie heart score, Chads2Vasc2 etc:1}  {Document your independent review of radiology images, and any outside records:1} {Document your discussion with family members, caretakers, and with consultants:1} {Document social determinants of health affecting pt's care:1} {Document your decision making why or why not admission, treatments were needed:1} Final Clinical Impression(s) / ED Diagnoses Final diagnoses:  None    Rx / DC Orders ED Discharge Orders     None

## 2022-09-16 NOTE — ED Triage Notes (Signed)
Pt has right ear pain that spreads to right jaw and causes headaches. Pt thinks that he was bitten by something and now he had an ear infection. Pain 8/10. Pt has trouble chewing and swallowing bc of ear pain. No ear drainage

## 2022-09-17 ENCOUNTER — Encounter (HOSPITAL_COMMUNITY): Payer: Self-pay

## 2022-09-17 ENCOUNTER — Emergency Department (HOSPITAL_COMMUNITY): Payer: Medicare Other

## 2022-09-17 ENCOUNTER — Other Ambulatory Visit: Payer: Self-pay

## 2022-09-17 ENCOUNTER — Emergency Department (HOSPITAL_COMMUNITY)
Admission: EM | Admit: 2022-09-17 | Discharge: 2022-09-17 | Disposition: A | Discharge status: Home / Self Care | Payer: Medicare Other | Source: Home / Self Care | Attending: Emergency Medicine | Admitting: Emergency Medicine

## 2022-09-17 DIAGNOSIS — Z7982 Long term (current) use of aspirin: Secondary | ICD-10-CM | POA: Insufficient documentation

## 2022-09-17 DIAGNOSIS — R110 Nausea: Secondary | ICD-10-CM | POA: Insufficient documentation

## 2022-09-17 DIAGNOSIS — R519 Headache, unspecified: Secondary | ICD-10-CM | POA: Insufficient documentation

## 2022-09-17 DIAGNOSIS — R739 Hyperglycemia, unspecified: Secondary | ICD-10-CM | POA: Insufficient documentation

## 2022-09-17 DIAGNOSIS — J039 Acute tonsillitis, unspecified: Secondary | ICD-10-CM | POA: Diagnosis not present

## 2022-09-17 DIAGNOSIS — R200 Anesthesia of skin: Secondary | ICD-10-CM

## 2022-09-17 DIAGNOSIS — I10 Essential (primary) hypertension: Secondary | ICD-10-CM

## 2022-09-17 LAB — BASIC METABOLIC PANEL
Anion gap: 15 (ref 5–15)
BUN: 66 mg/dL — ABNORMAL HIGH (ref 6–20)
CO2: 26 mmol/L (ref 22–32)
Calcium: 8.8 mg/dL — ABNORMAL LOW (ref 8.9–10.3)
Chloride: 92 mmol/L — ABNORMAL LOW (ref 98–111)
Creatinine, Ser: 13.69 mg/dL — ABNORMAL HIGH (ref 0.61–1.24)
GFR, Estimated: 4 mL/min — ABNORMAL LOW (ref >60)
Glucose, Bld: 93 mg/dL (ref 70–99)
Potassium: 4.9 mmol/L (ref 3.5–5.1)
Sodium: 133 mmol/L — ABNORMAL LOW (ref 135–145)

## 2022-09-17 LAB — CBC WITH DIFFERENTIAL/PLATELET
Abs Immature Granulocytes: 0.01 10*3/uL (ref 0.00–0.07)
Basophils Absolute: 0 10*3/uL (ref 0.0–0.1)
Basophils Relative: 1 %
Eosinophils Absolute: 0.1 10*3/uL (ref 0.0–0.5)
Eosinophils Relative: 3 %
HCT: 31.9 % — ABNORMAL LOW (ref 39.0–52.0)
Hemoglobin: 10.7 g/dL — ABNORMAL LOW (ref 13.0–17.0)
Immature Granulocytes: 0 %
Lymphocytes Relative: 32 %
Lymphs Abs: 1 10*3/uL (ref 0.7–4.0)
MCH: 33.4 pg (ref 26.0–34.0)
MCHC: 33.5 g/dL (ref 30.0–36.0)
MCV: 99.7 fL (ref 80.0–100.0)
Monocytes Absolute: 0.3 10*3/uL (ref 0.1–1.0)
Monocytes Relative: 10 %
Neutro Abs: 1.7 10*3/uL (ref 1.7–7.7)
Neutrophils Relative %: 54 %
Platelets: 159 10*3/uL (ref 150–400)
RBC: 3.2 MIL/uL — ABNORMAL LOW (ref 4.22–5.81)
RDW: 14.8 % (ref 11.5–15.5)
WBC: 3.1 10*3/uL — ABNORMAL LOW (ref 4.0–10.5)
nRBC: 0 % (ref 0.0–0.2)

## 2022-09-17 LAB — CBG MONITORING, ED
Glucose-Capillary: 73 mg/dL (ref 70–99)
Glucose-Capillary: 96 mg/dL (ref 70–99)

## 2022-09-17 LAB — MAGNESIUM: Magnesium: 2.5 mg/dL — ABNORMAL HIGH (ref 1.7–2.4)

## 2022-09-17 LAB — GROUP A STREP BY PCR: Group A Strep by PCR: NOT DETECTED

## 2022-09-17 LAB — TROPONIN I (HIGH SENSITIVITY): Troponin I (High Sensitivity): 34 ng/L — ABNORMAL HIGH (ref <18)

## 2022-09-17 MED ORDER — CARVEDILOL 12.5 MG PO TABS
25.0000 mg | ORAL_TABLET | Freq: Two times a day (BID) | ORAL | Status: DC
  Administered 2022-09-17: 25 mg via ORAL
  Filled 2022-09-17: qty 2

## 2022-09-17 MED ORDER — AMLODIPINE BESYLATE 5 MG PO TABS
10.0000 mg | ORAL_TABLET | Freq: Every day | ORAL | Status: DC
  Administered 2022-09-17: 10 mg via ORAL
  Filled 2022-09-17: qty 2

## 2022-09-17 MED ORDER — ACETAMINOPHEN 500 MG PO TABS
500.0000 mg | ORAL_TABLET | Freq: Once | ORAL | Status: AC
  Administered 2022-09-17: 500 mg via ORAL
  Filled 2022-09-17: qty 1

## 2022-09-17 MED ORDER — HYDRALAZINE HCL 50 MG PO TABS
100.0000 mg | ORAL_TABLET | Freq: Three times a day (TID) | ORAL | Status: DC
  Administered 2022-09-17: 100 mg via ORAL
  Filled 2022-09-17: qty 2

## 2022-09-17 MED ORDER — CLONIDINE HCL 0.1 MG PO TABS
0.1000 mg | ORAL_TABLET | Freq: Once | ORAL | Status: AC
  Administered 2022-09-17: 0.1 mg via ORAL
  Filled 2022-09-17: qty 1

## 2022-09-17 MED ORDER — DEXAMETHASONE SODIUM PHOSPHATE 10 MG/ML IJ SOLN
INTRAMUSCULAR | Status: AC
  Administered 2022-09-17: 6 mg via ORAL
  Filled 2022-09-17: qty 1

## 2022-09-17 MED ORDER — IOHEXOL 350 MG/ML SOLN
75.0000 mL | Freq: Once | INTRAVENOUS | Status: AC | PRN
  Administered 2022-09-17: 75 mL via INTRAVENOUS

## 2022-09-17 MED ORDER — CLONIDINE HCL 0.1 MG PO TABS
0.1000 mg | ORAL_TABLET | Freq: Two times a day (BID) | ORAL | Status: DC
  Administered 2022-09-17: 0.1 mg via ORAL
  Filled 2022-09-17: qty 1

## 2022-09-17 MED ORDER — PENICILLIN V POTASSIUM 500 MG PO TABS: 500.0000 mg | ORAL_TABLET | Freq: Four times a day (QID) | ORAL | 0 refills | Status: AC

## 2022-09-17 MED ORDER — FENTANYL CITRATE PF 50 MCG/ML IJ SOSY
75.0000 ug | PREFILLED_SYRINGE | Freq: Once | INTRAMUSCULAR | Status: AC
  Administered 2022-09-17: 75 ug via INTRAMUSCULAR
  Filled 2022-09-17: qty 2

## 2022-09-17 MED ORDER — LOSARTAN POTASSIUM 50 MG PO TABS
50.0000 mg | ORAL_TABLET | Freq: Every day | ORAL | Status: DC
  Administered 2022-09-17: 50 mg via ORAL
  Filled 2022-09-17: qty 1

## 2022-09-17 NOTE — ED Notes (Signed)
Provided patient with ginger ale, per patient felt like blood sugar was low. CBG 73 in triage.

## 2022-09-17 NOTE — Discharge Instructions (Signed)
I discussed her care with the neurologist, he recommends that you follow-up in the office.  There is no reason to be admitted to the hospital, there is no signs of strokes or aneurysms or hemorrhage in your brain, no signs of tumors, ER for worsening symptoms  I want you to see the neurology team at the next available appointment.  Please see the phone number above and call for this appointment today.

## 2022-09-17 NOTE — ED Notes (Signed)
Patient transported to MRI 

## 2022-09-17 NOTE — ED Notes (Signed)
Got patient back on the monitor patient is resting with call bell in reach  

## 2022-09-17 NOTE — ED Notes (Signed)
Meds/swab done late due to priority of pt care.

## 2022-09-17 NOTE — ED Notes (Signed)
DC instructions reviewed with pt. PT verbalized understanding. Pt DC °

## 2022-09-17 NOTE — ED Provider Notes (Signed)
[place] EMERGENCY DEPARTMENT AT [facility] Provider Note   CSN: [id number] Arrival date & time: 09/17/22  0536     History  Chief Complaint  Patient presents with   Shortness of Breath   Chest Pain    [name] is a 37 y.o. male.   Shortness of Breath Associated symptoms: chest pain   Chest Pain Associated symptoms: shortness of breath    This patient is a 37 year old male, history of multiple medical problems including end-stage renal disease on dialysis.  He had been seen overnight because of ear discomfort and neck discomfort and had a very broad workup including CT scans of the neck and face, there is no acute findings, he states that he has struggled with having numbness of his face numbness of his right hand and his bilateral legs and because of this he called his dialysis center who recommended that upon discharge from the emergency department when he got to his car that he should come back and be evaluated for the numbness.  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  He has also had some issues with restless legs which she complains of in relation to the numbness of his legs.  He states that this is a problem he has had over time and no one has been able to tell him why his legs do that.  He denies chest pain but does endorse some mild shortness of breath despite not having any coughing or fevers.  He has no swelling of his legs, he did receive dialysis yesterday.  His workup overnight was rather unrevealing    Home Medications Prior to Admission medications   Medication Sig Start Date End Date Taking? Authorizing Provider  acetaminophen (TYLENOL) 325 MG tablet Take 2 tablets (650 mg total) by mouth every 6 (six) hours as needed for mild pain (or Fever >/= 101). Patient not taking: Reported on  06/16/2022 04/14/22   Lockie Mola, MD  amLODipine (NORVASC) 10 MG tablet TAKE 1 TABLET BY MOUTH EVERY DAY 04/10/22   Sabino Dick, DO  calcitRIOL (ROCALTROL) 0.25 MCG capsule Take 0.25 mcg by mouth daily. Patient not taking: Reported on 06/16/2022    [provider]  calcium acetate (PHOSLO) 667 MG capsule Take 1 capsule (667 mg total) by mouth 3 (three) times daily with meals. Patient taking differently: Take 2,001 mg by mouth 3 (three) times daily with meals. 01/04/22   Vonna Drafts, MD  carvedilol (COREG) 25 MG tablet Take 1 tablet (25 mg total) by mouth 2 (two) times daily. 06/16/22   Sabino Dick, DO  cloNIDine (CATAPRES) 0.1 MG tablet Take 1 tablet (0.1 mg total) by mouth 2 (two) times daily. 08/05/22   Corky Crafts, MD  divalproex (DEPAKOTE ER) 500 MG 24 hr tablet Take 1 tablet (500 mg total) by mouth at bedtime. 08/05/22   Sabino Dick, DO  hydrALAZINE (APRESOLINE) 100 MG tablet Take 1 tablet (100 mg total) by mouth 3 (three) times daily. 05/20/22   Gaston Islam., NP  hydrocortisone-pramoxine [facility]) 2.5-1 % rectal cream Place rectally 2 (two) times daily. 04/14/22   Lockie Mola, MD  losartan (COZAAR) 50 MG tablet Take 1 tablet (50 mg total) by mouth daily. 06/06/22   [name], DO  oxyCODONE (ROXICODONE) 5 MG immediate release tablet Take 1 tablet (5 mg  total) by mouth every 6 (six) hours as needed for severe pain. 06/21/22   Elayne Snare K, DO  pantoprazole (PROTONIX) 40 MG tablet Take 1 tablet (40 mg total) by mouth 2 (two) times daily. 06/06/22   [name], DO  penicillin v potassium (VEETID) 500 MG tablet Take 1 tablet (500 mg total) by mouth 4 (four) times daily for 10 days. 09/17/22 09/27/22  Carroll Sage, PA-C  SUMAtriptan (IMITREX) 50 MG tablet Take 1 tablet (50 mg total) by mouth every 2 (two) hours as needed for migraine. Do not take more than 4 tablets in 24 hours. 08/05/22   Sabino Dick, DO  triamcinolone ointment  (KENALOG) 0.1 % Apply to scrotum twice daily for itching. Do not use for more than 2 weeks continuously 05/30/22   Sabino Dick, DO      Allergies    Zestril [lisinopril], Nsaids, and Risperdal [risperidone]    Review of Systems   Review of Systems  Respiratory:  Positive for shortness of breath.   Cardiovascular:  Positive for chest pain.  All other systems reviewed and are negative.   Physical Exam Updated Vital Signs BP (!) 173/109   Pulse 89   Temp 98.1 F (36.7 C) (Oral)   Resp 19   Ht 1.727 m (5\' 8" )   Wt 77.1 kg   SpO2 97%   BMI 25.84 kg/m  Physical Exam Vitals and nursing note reviewed.  Constitutional:      General: He is not in acute distress.    Appearance: He is well-developed.  HENT:     Head: Normocephalic and atraumatic.     Mouth/Throat:     Pharynx: No oropharyngeal exudate.  Eyes:     General: No scleral icterus.       Right eye: No discharge.        Left eye: No discharge.     Conjunctiva/sclera: Conjunctivae normal.     Pupils: Pupils are equal, round, and reactive to light.  Neck:     Thyroid: No thyromegaly.     Vascular: No JVD.  Cardiovascular:     Rate and Rhythm: Normal rate and regular rhythm.     Heart sounds: Normal heart sounds. No murmur heard.    No friction rub. No gallop.     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 No respiratory distress.     Breath sounds: Normal breath sounds. No wheezing or rales.  Abdominal:     General: Bowel sounds are normal. There is no distension.     Palpations: Abdomen is soft. There is no mass.     Tenderness: There is no abdominal tenderness.  Musculoskeletal:        General: No tenderness. Normal range of motion.     Cervical back: Normal range of motion and neck supple.     Comments: No edema of the lower extremities  Lymphadenopathy:     Cervical: No cervical  adenopathy.  Skin:    General: Skin is warm and dry.     Findings: No erythema or rash.  Neurological:     Mental Status: [name].     Coordination: Coordination normal.     Comments: The patient has no facial droop, his speech is at baseline, his coordination is normal, his strength is normal in all 4 extremities, he has brisk patellar reflexes bilaterally, totally normal strength  at the hips knees and ankles especially to dorsiflexion and plantarflexion of the feet.  He has totally normal sensation when I touch his skin bilaterally without any asymmetry.  Psychiatric:        Behavior: Behavior normal.     ED Results / Procedures / Treatments   Labs (all labs ordered are listed, but only abnormal results are displayed) Labs Reviewed  TROPONIN I (HIGH SENSITIVITY) - Abnormal; Notable for the following components:      Result Value   Troponin I (High Sensitivity) 34 (*)    All other components within normal limits  CBG MONITORING, ED  CBG MONITORING, ED    EKG EKG Interpretation  Date/Time:  Wednesday Sep 17 2022 05:51:11 EDT Ventricular Rate:  84 PR Interval:  162 QRS Duration: 80 QT Interval:  390 QTC Calculation: 461 R Axis:   11 Text Interpretation: Sinus rhythm Probable anterior infarct, old Confirmed by Zadie Rhine ([id number]) on 09/17/2022 6:06:01 AM  Radiology MR BRAIN WO CONTRAST  Result Date: 09/17/2022 CLINICAL DATA:  Numbness and tingling EXAM: MRI HEAD WITHOUT CONTRAST TECHNIQUE: Multiplanar, multiecho pulse sequences of the brain and surrounding structures were obtained without intravenous contrast. COMPARISON:  MRI Head 04/17/10 FINDINGS: Brain: Negative for an acute infarct. No hemorrhage. No hydrocephalus. No extra-axial fluid collection. There are periventricular T2 hyperintense lesions there are somewhat perpendicular in appearance. These have slightly progressed from 2011, best seen in the region of the left frontal horn. No mass effect. No mass lesion.  Vascular: Normal flow voids. Skull and upper cervical spine: Normal marrow signal. Sinuses/Orbits: Trace right mastoid effusion. No middle ear effusion. Mild mucosal thickening bilateral ethmoid sinuses. Orbits are unremarkable. Other: None. IMPRESSION: 1. T2/FLAIR hyperintense periventricular lesions have slightly progressed compared to 2011. While these are nonspecific, demyelinating disease is a differential consideration. No evidence of diffusion restriction to suggest active demyelination. 2. Otherwise no acute intracranial abnormality. Electronically Signed   By: Lorenza Cambridge M.D.   On: 09/17/2022 08:51   DG Chest Portable 1 View  Result Date: 09/17/2022 CLINICAL DATA:  Shortness of breath and chest pain. EXAM: PORTABLE CHEST 1 VIEW COMPARISON:  06/03/2022 FINDINGS: Stable cardiomediastinal contours. There is no pleural fluid or edema. No airspace opacities identified. The visualized osseous structures are unremarkable. IMPRESSION: No active disease. Electronically Signed   By: Signa Kell M.D.   On: 09/17/2022 06:58   CT Soft Tissue Neck W Contrast  Result Date: 09/17/2022 CLINICAL DATA:  Right ear pain with headache EXAM: CT NECK WITH CONTRAST CT MAXILLOFACIAL WITH CONTRAST TECHNIQUE: Multidetector CT imaging of the neck was performed using the standard protocol following the bolus administration of intravenous contrast. Multidetector CT imaging of the maxillofacial structures was performed with intravenous contrast. Multiplanar CT image reconstructions were also generated. RADIATION DOSE REDUCTION: This exam was performed according to the departmental dose-optimization program which includes automated exposure control, adjustment of the mA and/or kV according to patient size and/or use of iterative reconstruction technique. CONTRAST:  75mL OMNIPAQUE IOHEXOL 350 MG/ML SOLN COMPARISON:  None Available. FINDINGS: PHARYNX AND LARYNX: Mild edema of the palatine tonsils without peritonsillar or  retropharyngeal abscess. SALIVARY GLANDS: There is inflammatory change within the submandibular space without abscess or other fluid collection. THYROID: Normal. LYMPH NODES: No enlarged or abnormal density lymph nodes. VASCULAR: Major cervical vessels are patent. SKELETON: No bony spinal canal stenosis. No lytic or blastic lesions. UPPER CHEST: Clear. Orbits: Negative. No traumatic or inflammatory finding. Sinuses: Clear. Limited intracranial: No significant or unexpected  finding. IMPRESSION: 1. Mild edema of the palatine tonsils without peritonsillar or retropharyngeal abscess. 2. Inflammatory change within the submandibular space without abscess or other fluid collection. Electronically Signed   By: Deatra Robinson M.D.   On: 09/17/2022 03:15   CT Maxillofacial W Contrast  Result Date: 09/17/2022 CLINICAL DATA:  Right ear pain with headache EXAM: CT NECK WITH CONTRAST CT MAXILLOFACIAL WITH CONTRAST TECHNIQUE: Multidetector CT imaging of the neck was performed using the standard protocol following the bolus administration of intravenous contrast. Multidetector CT imaging of the maxillofacial structures was performed with intravenous contrast. Multiplanar CT image reconstructions were also generated. RADIATION DOSE REDUCTION: This exam was performed according to the departmental dose-optimization program which includes automated exposure control, adjustment of the mA and/or kV according to patient size and/or use of iterative reconstruction technique. CONTRAST:  75mL OMNIPAQUE IOHEXOL 350 MG/ML SOLN COMPARISON:  None Available. FINDINGS: PHARYNX AND LARYNX: Mild edema of the palatine tonsils without peritonsillar or retropharyngeal abscess. SALIVARY GLANDS: There is inflammatory change within the submandibular space without abscess or other fluid collection. THYROID: Normal. LYMPH NODES: No enlarged or abnormal density lymph nodes. VASCULAR: Major cervical vessels are patent. SKELETON: No bony spinal canal  stenosis. No lytic or blastic lesions. UPPER CHEST: Clear. Orbits: Negative. No traumatic or inflammatory finding. Sinuses: Clear. Limited intracranial: No significant or unexpected finding. IMPRESSION: 1. Mild edema of the palatine tonsils without peritonsillar or retropharyngeal abscess. 2. Inflammatory change within the submandibular space without abscess or other fluid collection. Electronically Signed   By: Deatra Robinson M.D.   On: 09/17/2022 03:15    Procedures Procedures    Medications Ordered in ED Medications  amLODipine (NORVASC) tablet 10 mg (10 mg Oral Given 09/17/22 0750)  carvedilol (COREG) tablet 25 mg (25 mg Oral Given 09/17/22 0749)  cloNIDine (CATAPRES) tablet 0.1 mg (0.1 mg Oral Given 09/17/22 0924)  hydrALAZINE (APRESOLINE) tablet 100 mg (100 mg Oral Given 09/17/22 0749)  losartan (COZAAR) tablet 50 mg (50 mg Oral Given 09/17/22 0749)  acetaminophen (TYLENOL) tablet 500 mg (500 mg Oral Given 09/17/22 4098)    ED Course/ Medical Decision Making/ A&P                             Medical Decision Making Amount and/or Complexity of Data Reviewed Radiology: ordered.  Risk OTC drugs. Prescription drug management.    This patient presents to the ED for concern of numbness, this involves an extensive number of treatment options, and is a complaint that carries with it a high risk of complications and morbidity.  The differential diagnosis includes stroke, electrolyte abnormalities, B12 deficiency, anxiety.  This patient has had multiple workups in the past, he has never had an MRI of his brain that I can see, he does have some lateralizing numbness in the arm but both legs he stated are numb despite having a normal neurologic exam.  Likely needs an MRI to make sure there is nothing else going on but otherwise the patient does not appear in distress.  He is very hypertensive but has not had his medications so I will order his blood pressure medications as well   Co morbidities that  complicate the patient evaluation  Severe hypertension, end-stage renal disease on dialysis   Additional history obtained:  Additional history obtained from multiple prior visits, hospitalizations, vascular consultations External records from outside source obtained and reviewed including as above   Lab Tests:  I Ordered,  and personally interpreted labs.  The pertinent results include: Reviewed labs from earlier.  No acute findings that would explain the patient's symptoms, potassium was 4.9 7 hours ago   Imaging Studies ordered:  I ordered imaging studies including  reviewed CT scans from prior night   MRI brain ordered, I reviewed the images and interpreted them, my interpretation shows scattered nonspecific findings I agree with the radiologist interpretation   Consultations: I discussed  with Dr. Onalee Hua of the neurology service who recommends that the patient be seen in the outpatient setting, no need for contrasted study, no need for admission to the hospital, this is likely nonspecific and not related to MS or any other acute pathological finding.   On another note the patient's blood pressure has improved after giving doses of medications.  He appears medically stable for discharge without any signs of weakness or formal cranial nerve abnormalities.  The numbness seems to come and go   Cardiac Monitoring: / EKG:  The patient was maintained on a cardiac monitor.  I personally viewed and interpreted the cardiac monitored which showed an underlying rhythm of: Normal sinus rhythm     Problem List / ED Course / Critical interventions / Medication management  Home blood pressure meds given with improvement I have reviewed the patients home medicines and have made adjustments as needed   Social Determinants of Health:  End-stage renal disease   Test / Admission - Considered:  Can follow-up in the outpatient setting         Final Clinical  Impression(s) / ED Diagnoses Final diagnoses:  Essential hypertension  Numbness    Rx / DC Orders ED Discharge Orders     None         Eber Hong, MD 09/17/22 [phone]

## 2022-09-17 NOTE — ED Notes (Signed)
Patient transported to CT 

## 2022-09-17 NOTE — ED Notes (Signed)
IV attempt failed x 2, PA William at bedside attempting ultrasound line

## 2022-09-17 NOTE — ED Notes (Signed)
IV attempt failure by Chrissie Noa, [place]. Awaiting IV team

## 2022-09-17 NOTE — ED Triage Notes (Signed)
Pt was just discharged and was walking to car when he began having chest pain and SOB. Also complains that his whole face went numb. Chest pain is described as tightness and is central. Pt not answering all triage questions.

## 2022-09-17 NOTE — ED Provider Triage Note (Signed)
Emergency Medicine Provider Triage Evaluation Note  [name] , a 37 y.o. male  was evaluated in triage.  Pt complains of chest pain, shortness of breath, body numbness.  Review of Systems  Positive: Chest pain, numbness Negative: fever  Physical Exam  BP (!) 191/117   Pulse 85   Temp 98.1 F (36.7 C) (Oral)   Resp (!) 23   Ht 1.727 m (5\' 8" )   Wt 77.1 kg   SpO2 96%   BMI 25.84 kg/m  Gen:   Awake, anxious Resp:  hyperventilating MSK:   Moves extremities without difficulty  Other:  No arm/leg drift  Medical Decision Making  Medically screening exam initiated at 6:21 AM.  Appropriate orders placed.  [name] was informed that the remainder of the evaluation will be completed by another provider, this initial triage assessment does not replace that evaluation, and the importance of remaining in the ED until their evaluation is complete.  Patient was just in the ER for an unrelated complaint.  Once he left he reports he had full facial numbness on both sides numbness in his hands and feet, and also having chest pain and shortness of breath.  Patient has multiple medical comorbidities.  CBC and BMP were already ordered and resulted on recent ED stay.  Will add on troponin, x-ray and CT head   Zadie Rhine, MD 09/17/22 [phone]

## 2022-09-17 NOTE — Discharge Instructions (Addendum)
Your imaging shows that you have some inflammation around your tonsil which I suspect is causing some of your pain, if start you on antibiotics take as prescribed.  May use over-the-counter pain medication as needed.  If symptoms not improving please follow-up with ENT Blood pressure-slightly elevated today, please continue take all your blood pressure medications  Come back to the emergency department if you develop chest pain, shortness of breath, severe abdominal pain, uncontrolled nausea, vomiting, diarrhea.

## 2022-09-17 NOTE — Progress Notes (Signed)
For stat PIV start for CT, this IV Nurse at bedside at this time, pt. Still in the restroom.

## 2022-09-17 NOTE — ED Notes (Signed)
Labs sent to main lab @620am  (cardiac Labs)

## 2022-09-21 NOTE — Progress Notes (Unsigned)
    SUBJECTIVE:   Chief compliant/HPI: annual examination  [name] is a 37 y.o. who presents today for an annual exam.   Reviewed and updated history ***.   Review of systems form notable for ***.    OBJECTIVE:   There were no vitals taken for this visit.  General: Awake, alert, hard of hearing 2/2 Al[place], in no acute distress, pleasant and cooperative with examination HEENT: Normocephalic, atraumatic, nares patent, dentition is good, oropharynx without erythema or exudates, TM's clear bilaterally, no thyroid nodules palpated Cardio: RRR without murmur, 2+ radial, DP and PT pulses b/l Respiratory: CTAB without wheezing/rhonchi/rales Abdomen: Soft, non-tender to palpation of all quadrants, non-distended, no rebound/guarding, no organomegaly MSK: Able to move all extremities spontaneously, good muscle strength, no abnormalities Extremities: without edema or cyanosis Neuro: Speech is clear and intact, no focal deficits, no facial asymmetry, follows commands  Psych: Normal mood and affect   ASSESSMENT/PLAN:   No problem-specific Assessment & Plan notes found for this encounter.    Annual Examination  See AVS for age appropriate recommendations  PHQ score ***, reviewed and discussed.  Blood pressure reviewed and at goal. ***   Advanced directive ***   Considered the following items based upon USPSTF recommendations: HIV testing: {not indicated/requested/declined:14582} Hepatitis C: {not indicated/requested/declined:14582} Hepatitis B: {not indicated/requested/declined:14582} Syphilis if at high risk: {{not indicated/requested/declined:14582} GC/CT{not indicated/requested/declined:14582} Lipid panel (nonfasting or fasting) discussed based upon AHA recommendations and {ordered not order:23822}.  Consider repeat every 4-6 years.  Reviewed risk factors for latent tuberculosis and {not indicated/requested/declined:14582} Immunizations ***   Follow up in 1 year or sooner  if indicated.    Sabino Dick, DO [facility]

## 2022-09-22 ENCOUNTER — Ambulatory Visit (INDEPENDENT_AMBULATORY_CARE_PROVIDER_SITE_OTHER): Payer: Medicare Other | Admitting: Family Medicine

## 2022-09-22 ENCOUNTER — Encounter: Payer: Self-pay | Admitting: Family Medicine

## 2022-09-22 VITALS — BP 168/100 | HR 74 | Ht 68.0 in | Wt 174.0 lb

## 2022-09-22 DIAGNOSIS — R519 Headache, unspecified: Secondary | ICD-10-CM | POA: Diagnosis present

## 2022-09-22 DIAGNOSIS — I151 Hypertension secondary to other renal disorders: Secondary | ICD-10-CM

## 2022-09-22 NOTE — Patient Instructions (Signed)
It was wonderful to see you today.  Please bring ALL of your medications with you to every visit.   Today we talked about:  For your headaches- I would try heating pads to your neck.  You can also take the Excedrin migraine as needed for headaches. Try to stretch and work on ways to relax and de-stress. I think your headaches are mostly tension headaches and caused from extra stress and tension in your muscles.   Continue to work with the Cardiologists to manage your blood pressure and discuss side effects of the carvedilol.  If things don't improve- we can consider a Neurology referral.   I will see you again next week to discuss some of your other concerns.   Thank you for coming to your visit as scheduled. We have had a large "no-show" problem lately, and this significantly limits our ability to see and care for patients. As a friendly reminder- if you cannot make your appointment please call to cancel. We do have a no show policy for those who do not cancel within 24 hours. Our policy is that if you miss or fail to cancel an appointment within 24 hours, 3 times in a 98-month period, you may be dismissed from our clinic.   Thank you for choosing [facility].   Please call [phone] with any questions about today's appointment.  Please be sure to schedule follow up at the front  desk before you leave today.   Sabino Dick, DO PGY-3 Family Medicine

## 2022-09-23 ENCOUNTER — Emergency Department (HOSPITAL_COMMUNITY): Payer: Medicare Other

## 2022-09-23 ENCOUNTER — Inpatient Hospital Stay (HOSPITAL_COMMUNITY): Payer: Medicare Other

## 2022-09-23 ENCOUNTER — Encounter (HOSPITAL_COMMUNITY): Payer: Self-pay

## 2022-09-23 ENCOUNTER — Other Ambulatory Visit: Payer: Self-pay

## 2022-09-23 ENCOUNTER — Observation Stay (HOSPITAL_COMMUNITY)
Admission: EM | Admit: 2022-09-23 | Discharge: 2022-09-23 | Disposition: A | Discharge status: Home / Self Care | Payer: Medicare Other | Attending: Family Medicine | Admitting: Family Medicine

## 2022-09-23 DIAGNOSIS — N186 End stage renal disease: Secondary | ICD-10-CM | POA: Insufficient documentation

## 2022-09-23 DIAGNOSIS — R079 Chest pain, unspecified: Secondary | ICD-10-CM | POA: Diagnosis present

## 2022-09-23 DIAGNOSIS — D649 Anemia, unspecified: Secondary | ICD-10-CM | POA: Insufficient documentation

## 2022-09-23 DIAGNOSIS — I132 Hypertensive heart and chronic kidney disease with heart failure and with stage 5 chronic kidney disease, or end stage renal disease: Secondary | ICD-10-CM | POA: Insufficient documentation

## 2022-09-23 DIAGNOSIS — Z992 Dependence on renal dialysis: Secondary | ICD-10-CM | POA: Insufficient documentation

## 2022-09-23 DIAGNOSIS — E875 Hyperkalemia: Secondary | ICD-10-CM | POA: Diagnosis not present

## 2022-09-23 DIAGNOSIS — Z79899 Other long term (current) drug therapy: Secondary | ICD-10-CM | POA: Diagnosis not present

## 2022-09-23 DIAGNOSIS — Z87891 Personal history of nicotine dependence: Secondary | ICD-10-CM | POA: Insufficient documentation

## 2022-09-23 DIAGNOSIS — I160 Hypertensive urgency: Secondary | ICD-10-CM | POA: Insufficient documentation

## 2022-09-23 DIAGNOSIS — Q8781 Alport syndrome: Secondary | ICD-10-CM

## 2022-09-23 DIAGNOSIS — I5020 Unspecified systolic (congestive) heart failure: Secondary | ICD-10-CM | POA: Diagnosis not present

## 2022-09-23 DIAGNOSIS — I5022 Chronic systolic (congestive) heart failure: Secondary | ICD-10-CM | POA: Diagnosis not present

## 2022-09-23 DIAGNOSIS — E8770 Fluid overload, unspecified: Secondary | ICD-10-CM | POA: Diagnosis not present

## 2022-09-23 DIAGNOSIS — R0789 Other chest pain: Principal | ICD-10-CM

## 2022-09-23 DIAGNOSIS — R519 Headache, unspecified: Secondary | ICD-10-CM | POA: Insufficient documentation

## 2022-09-23 DIAGNOSIS — I10 Essential (primary) hypertension: Secondary | ICD-10-CM | POA: Diagnosis not present

## 2022-09-23 DIAGNOSIS — D62 Acute posthemorrhagic anemia: Secondary | ICD-10-CM | POA: Insufficient documentation

## 2022-09-23 LAB — CBC WITH DIFFERENTIAL/PLATELET
Abs Immature Granulocytes: 0.02 10*3/uL (ref 0.00–0.07)
Basophils Absolute: 0.1 10*3/uL (ref 0.0–0.1)
Basophils Relative: 1 %
Eosinophils Absolute: 0.2 10*3/uL (ref 0.0–0.5)
Eosinophils Relative: 4 %
HCT: 33.9 % — ABNORMAL LOW (ref 39.0–52.0)
Hemoglobin: 11.4 g/dL — ABNORMAL LOW (ref 13.0–17.0)
Immature Granulocytes: 0 %
Lymphocytes Relative: 25 %
Lymphs Abs: 1.4 10*3/uL (ref 0.7–4.0)
MCH: 32.7 pg (ref 26.0–34.0)
MCHC: 33.6 g/dL (ref 30.0–36.0)
MCV: 97.1 fL (ref 80.0–100.0)
Monocytes Absolute: 0.4 10*3/uL (ref 0.1–1.0)
Monocytes Relative: 8 %
Neutro Abs: 3.5 10*3/uL (ref 1.7–7.7)
Neutrophils Relative %: 62 %
Platelets: 184 10*3/uL (ref 150–400)
RBC: 3.49 MIL/uL — ABNORMAL LOW (ref 4.22–5.81)
RDW: 14.4 % (ref 11.5–15.5)
WBC: 5.5 10*3/uL (ref 4.0–10.5)
nRBC: 0 % (ref 0.0–0.2)

## 2022-09-23 LAB — TROPONIN I (HIGH SENSITIVITY)
Troponin I (High Sensitivity): 32 ng/L — ABNORMAL HIGH (ref <18)
Troponin I (High Sensitivity): 32 ng/L — ABNORMAL HIGH (ref <18)

## 2022-09-23 LAB — HEPATITIS B SURFACE ANTIGEN: Hepatitis B Surface Ag: NONREACTIVE

## 2022-09-23 LAB — BASIC METABOLIC PANEL
Anion gap: 21 — ABNORMAL HIGH (ref 5–15)
BUN: 79 mg/dL — ABNORMAL HIGH (ref 6–20)
CO2: 20 mmol/L — ABNORMAL LOW (ref 22–32)
Calcium: 9.1 mg/dL (ref 8.9–10.3)
Chloride: 88 mmol/L — ABNORMAL LOW (ref 98–111)
Creatinine, Ser: 12.45 mg/dL — ABNORMAL HIGH (ref 0.61–1.24)
GFR, Estimated: 5 mL/min — ABNORMAL LOW (ref >60)
Glucose, Bld: 74 mg/dL (ref 70–99)
Potassium: 5.7 mmol/L — ABNORMAL HIGH (ref 3.5–5.1)
Sodium: 129 mmol/L — ABNORMAL LOW (ref 135–145)

## 2022-09-23 LAB — D-DIMER, QUANTITATIVE: D-Dimer, Quant: 0.74 ug/mL-FEU — ABNORMAL HIGH (ref 0.00–0.50)

## 2022-09-23 MED ORDER — ACETAMINOPHEN 325 MG PO TABS
650.0000 mg | ORAL_TABLET | Freq: Four times a day (QID) | ORAL | Status: DC | PRN
  Administered 2022-09-23: 650 mg via ORAL
  Filled 2022-09-23 (×2): qty 2

## 2022-09-23 MED ORDER — IOHEXOL 350 MG/ML SOLN
100.0000 mL | Freq: Once | INTRAVENOUS | Status: AC | PRN
  Administered 2022-09-23: 100 mL via INTRAVENOUS

## 2022-09-23 MED ORDER — ASPIRIN 81 MG PO CHEW
324.0000 mg | CHEWABLE_TABLET | Freq: Once | ORAL | Status: AC
  Administered 2022-09-23: 324 mg via ORAL
  Filled 2022-09-23: qty 4

## 2022-09-23 MED ORDER — SODIUM ZIRCONIUM CYCLOSILICATE 10 G PO PACK
10.0000 g | PACK | Freq: Once | ORAL | Status: AC
  Administered 2022-09-23: 10 g via ORAL
  Filled 2022-09-23: qty 1

## 2022-09-23 MED ORDER — ACETAMINOPHEN 500 MG PO TABS
1000.0000 mg | ORAL_TABLET | Freq: Once | ORAL | Status: AC
  Administered 2022-09-23: 1000 mg via ORAL
  Filled 2022-09-23: qty 2

## 2022-09-23 MED ORDER — ACETAMINOPHEN 325 MG PO TABS: 650.0000 mg | ORAL_TABLET | ORAL | Status: DC

## 2022-09-23 MED ORDER — CHLORHEXIDINE GLUCONATE CLOTH 2 % EX PADS: 6.0000 | MEDICATED_PAD | Freq: Every day | CUTANEOUS | Status: DC

## 2022-09-23 MED ORDER — CALCIUM CARBONATE ANTACID 500 MG PO CHEW
1.0000 | CHEWABLE_TABLET | ORAL | Status: AC
  Administered 2022-09-23: 400 mg via ORAL

## 2022-09-23 MED ORDER — LABETALOL HCL 5 MG/ML IV SOLN
INTRAVENOUS | Status: AC
  Filled 2022-09-23: qty 4

## 2022-09-23 MED ORDER — AMLODIPINE BESYLATE 10 MG PO TABS: 10.0000 mg | ORAL_TABLET | Freq: Every day | ORAL | Status: DC

## 2022-09-23 MED ORDER — ACETAMINOPHEN 500 MG PO TABS: 1000.0000 mg | ORAL_TABLET | ORAL | Status: DC

## 2022-09-23 MED ORDER — NITROGLYCERIN 0.4 MG SL SUBL
0.4000 mg | SUBLINGUAL_TABLET | Freq: Once | SUBLINGUAL | Status: AC
  Administered 2022-09-23: 0.4 mg via SUBLINGUAL
  Filled 2022-09-23: qty 1

## 2022-09-23 MED ORDER — ACETAMINOPHEN 650 MG RE SUPP: 650.0000 mg | Freq: Four times a day (QID) | RECTAL | Status: DC | PRN

## 2022-09-23 MED ORDER — HEPARIN SODIUM (PORCINE) 1000 UNIT/ML DIALYSIS
1200.0000 [IU] | INTRAMUSCULAR | Status: AC | PRN
  Administered 2022-09-23 (×2): 1200 [IU] via INTRAVENOUS_CENTRAL
  Filled 2022-09-23 (×5): qty 2

## 2022-09-23 MED ORDER — PANTOPRAZOLE SODIUM 40 MG PO TBEC
40.0000 mg | DELAYED_RELEASE_TABLET | Freq: Every day | ORAL | Status: DC
  Administered 2022-09-23: 40 mg via ORAL
  Filled 2022-09-23: qty 1

## 2022-09-23 MED ORDER — HYDRALAZINE HCL 20 MG/ML IJ SOLN
5.0000 mg | Freq: Once | INTRAMUSCULAR | Status: AC
  Administered 2022-09-23: 5 mg via INTRAVENOUS
  Filled 2022-09-23: qty 1

## 2022-09-23 MED ORDER — HYDRALAZINE HCL 20 MG/ML IJ SOLN
10.0000 mg | Freq: Once | INTRAMUSCULAR | Status: AC
  Administered 2022-09-23: 10 mg via INTRAVENOUS

## 2022-09-23 MED ORDER — HEPARIN SODIUM (PORCINE) 5000 UNIT/ML IJ SOLN
5000.0000 [IU] | Freq: Three times a day (TID) | INTRAMUSCULAR | Status: DC
  Administered 2022-09-23: 5000 [IU] via SUBCUTANEOUS
  Filled 2022-09-23: qty 1

## 2022-09-23 MED ORDER — CLONIDINE HCL 0.1 MG PO TABS
0.1000 mg | ORAL_TABLET | Freq: Once | ORAL | Status: AC
  Administered 2022-09-23: 0.1 mg via ORAL
  Filled 2022-09-23: qty 1

## 2022-09-23 MED ORDER — AMLODIPINE BESYLATE 5 MG PO TABS
10.0000 mg | ORAL_TABLET | Freq: Once | ORAL | Status: AC
  Administered 2022-09-23: 10 mg via ORAL
  Filled 2022-09-23: qty 2

## 2022-09-23 MED ORDER — DIVALPROEX SODIUM ER 500 MG PO TB24
500.0000 mg | ORAL_TABLET | Freq: Every day | ORAL | Status: DC
  Filled 2022-09-23: qty 1

## 2022-09-23 MED ORDER — HYDROMORPHONE HCL 1 MG/ML IJ SOLN
0.5000 mg | Freq: Once | INTRAMUSCULAR | Status: AC
  Administered 2022-09-23: 0.5 mg via INTRAVENOUS
  Filled 2022-09-23: qty 0.5

## 2022-09-23 MED ORDER — CLONIDINE HCL 0.1 MG PO TABS
0.1000 mg | ORAL_TABLET | Freq: Two times a day (BID) | ORAL | Status: DC
  Administered 2022-09-23: 0.1 mg via ORAL
  Filled 2022-09-23: qty 1

## 2022-09-23 MED ORDER — CALCIUM GLUCONATE-NACL 1-0.675 GM/50ML-% IV SOLN
1.0000 g | Freq: Once | INTRAVENOUS | Status: AC
  Administered 2022-09-23: 1000 mg via INTRAVENOUS
  Filled 2022-09-23: qty 50

## 2022-09-23 MED ORDER — LOSARTAN POTASSIUM 50 MG PO TABS
50.0000 mg | ORAL_TABLET | Freq: Every day | ORAL | Status: DC
  Administered 2022-09-23: 50 mg via ORAL
  Filled 2022-09-23: qty 1

## 2022-09-23 MED ORDER — LABETALOL HCL 5 MG/ML IV SOLN
20.0000 mg | Freq: Once | INTRAVENOUS | Status: AC
  Administered 2022-09-23: 20 mg via INTRAVENOUS
  Filled 2022-09-23: qty 4

## 2022-09-23 MED ORDER — CALCIUM CARBONATE ANTACID 500 MG PO CHEW
1.0000 | CHEWABLE_TABLET | Freq: Three times a day (TID) | ORAL | Status: DC | PRN
  Filled 2022-09-23: qty 2

## 2022-09-23 MED ORDER — HYDRALAZINE HCL 50 MG PO TABS
100.0000 mg | ORAL_TABLET | Freq: Three times a day (TID) | ORAL | Status: DC
  Administered 2022-09-23: 100 mg via ORAL
  Filled 2022-09-23: qty 2

## 2022-09-23 MED ORDER — ACETAMINOPHEN-CAFFEINE 500-65 MG PO TABS
2.0000 | ORAL_TABLET | Freq: Once | ORAL | Status: AC
  Administered 2022-09-23: 2 via ORAL
  Filled 2022-09-23: qty 2

## 2022-09-23 NOTE — Assessment & Plan Note (Addendum)
Patient has hypertension which is chronically high and can be up to 190 systolic.  Follows with cardiology.  On admission, blood pressure was in the 190s/110s.  Patient reports taking his medications as prescribed.  On exam, patient is tachycardic and anxious.  No need for aggressive BP control with continuous antihypertensive drip or as needed antihypertensive at this time.  He is prescribed carvedilol but appears he has not taken this in over a month due to side effects. -S/p IV hydralazine and IV labetalol in the ED - Continue to monitor - continue home blood pressure medications: amlodipine 10 mg, hydralazine 100 mg TID, losartan 50 mg, clonidine 0.1 mg BID - consider restarting carvedilol or switching to a different beta-blocker

## 2022-09-23 NOTE — Assessment & Plan Note (Addendum)
Patient reports chest pain and dyspnea that started this evening. Patient went to the [facility] clinic yesterday for occipital headache which seemed mostly related to stress and muscle tension. He is tachycardiac with elevated BP in the 200s/140s. EKG shows LVH without acute ischemia. CTA shows no findings of aortic aneurysm or dissection. Patient was very anxious and stated he has SOB which improved when he calmed down. Troponin x2 is stable at 32.  At this time chest pain and dyspnea is most likely related to some element of volume overload secondary to his ESRD. He is scheduled for dialysis on TTS.  Will admit patient to get nonemergent dialysis, unfortunately unable to arrange for ED dialysis overnight per ED provider.  Monitor for symptom improvement. - Admit to FMTS, attending Dr. Jennette Kettle - nephrology consulted in the ED for nonemergent dialysis - Vital signs - Pulse ox - AM CBC, CMP

## 2022-09-23 NOTE — Procedures (Signed)
HD Note:  Some information was entered later than the data was gathered due to patient care needs. The stated time with the data is accurate.  Received patient in bed to unit.  Patient eyes closed and slow to respond.   After treatment started, he complained of a headache.  Ice pack refreshed.  He stated that the tylenol did not work. Later in the treatment, he stated that Excedrin migraine worked for him.  Dr. Arlean Hopping was notified and an order was entered.  The medication was received and administered.  See [facility]  Informed consent signed and in chart.  At 1329,  Dr. Arlean Hopping notified this nurse that the patient had an appointment and would need to be taken off of treatment and sent back to his room for discharge.  The patient refused to come off of treatment at that time.  He then had some vomiting and a cramp developed in his back and he agreed to end treatment at 1354.  Patient was yelling about his headache and taking off his BP cuff and moving suddenly in his bed during some of the treatment.  Transported back to the room  Alert, without acute distress. Indicated that the headache was down to a 6 on the scale of 1-10. Hand-off given to patient's nurse.   Access used: Right upper arm fistula Access issues: None  Total UF removed: 3400 ml     [facility]

## 2022-09-23 NOTE — ED Provider Notes (Signed)
[place] EMERGENCY DEPARTMENT AT [facility] Provider Note   CSN: [id number] Arrival date & time: 09/23/22  0022     History  Chief Complaint  Patient presents with   Chest Pain   Shortness of Breath    [name] is a 37 y.o. male.  Level 5 caveat for hearing difficulty.  Patient with hypertension, ESRD on dialysis, bipolar disorder presenting with central chest pain.  Pain radiates in the center of his chest associate with shortness of breath and difficulty taking a deep breath.  Unable to state when the pain started but was this evening.  States it is constant.  Worse with deep breathing.  No associated cough or fever.  Feels like he cannot catch his breath.  Hypertensive on arrival but denies any missed medications.  Last dialysis session was on Saturday as scheduled, today is Monday.  Pain is worse with movement and deep breathing.  No abdominal pain.  No back pain.  No focal weakness, numbness or tingling  The history is provided by the patient.  Chest Pain Associated symptoms: shortness of breath   Associated symptoms: no abdominal pain, no fever, no headache, no nausea, no vomiting and no weakness   Shortness of Breath Associated symptoms: chest pain   Associated symptoms: no abdominal pain, no fever, no headaches, no rash and no vomiting        Home Medications Prior to Admission medications   Medication Sig Start Date End Date Taking? Authorizing Provider  acetaminophen (TYLENOL) 325 MG tablet Take 2 tablets (650 mg total) by mouth every 6 (six) hours as needed for mild pain (or Fever >/= 101). Patient not taking: Reported on 06/16/2022 04/14/22   Lockie Mola, MD  amLODipine (NORVASC) 10 MG tablet TAKE 1 TABLET BY MOUTH EVERY DAY 04/10/22   Sabino Dick, DO  calcitRIOL (ROCALTROL) 0.25 MCG capsule Take 0.25 mcg by mouth daily. Patient not taking: Reported on 06/16/2022    [provider]  calcium acetate (PHOSLO) 667 MG capsule Take 1  capsule (667 mg total) by mouth 3 (three) times daily with meals. Patient taking differently: Take 2,001 mg by mouth 3 (three) times daily with meals. 01/04/22   Vonna Drafts, MD  carvedilol (COREG) 25 MG tablet Take 1 tablet (25 mg total) by mouth 2 (two) times daily. 06/16/22   Sabino Dick, DO  cloNIDine (CATAPRES) 0.1 MG tablet Take 1 tablet (0.1 mg total) by mouth 2 (two) times daily. 08/05/22   Corky Crafts, MD  divalproex (DEPAKOTE ER) 500 MG 24 hr tablet Take 1 tablet (500 mg total) by mouth at bedtime. 08/05/22   Sabino Dick, DO  hydrALAZINE (APRESOLINE) 100 MG tablet Take 1 tablet (100 mg total) by mouth 3 (three) times daily. 05/20/22   Gaston Islam., NP  hydrocortisone-pramoxine [facility]) 2.5-1 % rectal cream Place rectally 2 (two) times daily. 04/14/22   Lockie Mola, MD  losartan (COZAAR) 50 MG tablet Take 1 tablet (50 mg total) by mouth daily. 06/06/22   [name], DO  oxyCODONE (ROXICODONE) 5 MG immediate release tablet Take 1 tablet (5 mg total) by mouth every 6 (six) hours as needed for severe pain. 06/21/22   Elayne Snare K, DO  pantoprazole (PROTONIX) 40 MG tablet TAKE 1 TABLET BY MOUTH EVERY DAY 09/17/22   Sabino Dick, DO  penicillin v potassium (VEETID) 500 MG tablet Take 1 tablet (500 mg total) by mouth 4 (four) times daily for 10 days. 09/17/22 09/27/22  Uvaldo Rising,  Anselm Pancoast, PA-C  SUMAtriptan (IMITREX) 50 MG tablet Take 1 tablet (50 mg total) by mouth every 2 (two) hours as needed for migraine. Do not take more than 4 tablets in 24 hours. 08/05/22   Sabino Dick, DO  triamcinolone ointment (KENALOG) 0.1 % Apply to scrotum twice daily for itching. Do not use for more than 2 weeks continuously 05/30/22   Sabino Dick, DO      Allergies    Zestril [lisinopril], Nsaids, and Risperdal [risperidone]    Review of Systems   Review of Systems  Constitutional:  Negative for activity change, appetite change and fever.  HENT:  Negative  for congestion and rhinorrhea.   Respiratory:  Positive for shortness of breath.   Cardiovascular:  Positive for chest pain.  Gastrointestinal:  Negative for abdominal pain, nausea and vomiting.  Genitourinary:  Negative for dysuria and hematuria.  Skin:  Negative for rash.  Neurological:  Negative for weakness and headaches.   all other systems are negative except as noted in the HPI and PMH.    Physical Exam Updated Vital Signs BP (!) 188/123   Pulse 97   Temp 98 F (36.7 C) (Oral)   Resp (!) 28   SpO2 100%  Physical Exam Vitals and nursing note reviewed.  Constitutional:      General: He is not in acute distress.    Appearance: He is well-developed.     Comments: uncomfortable  HENT:     Head: Normocephalic and atraumatic.     Mouth/Throat:     Pharynx: No oropharyngeal exudate.  Eyes:     Conjunctiva/sclera: Conjunctivae normal.     Pupils: Pupils are equal, round, and reactive to light.  Neck:     Comments: No meningismus. Cardiovascular:     Rate and Rhythm: Normal rate and regular rhythm.     Heart sounds: Normal heart sounds. No murmur heard. Pulmonary:     Effort: Pulmonary effort is normal. No respiratory distress.     Breath sounds: Normal breath sounds.  Chest:     Chest wall: Tenderness present.  Abdominal:     Palpations: Abdomen is soft.     Tenderness: There is no abdominal tenderness. There is no guarding or rebound.  Musculoskeletal:        General: No tenderness. Normal range of motion.     Cervical back: Normal range of motion and neck supple.     Comments: Dialysis fistula right arm with thrill present  Skin:    General: Skin is warm.  Neurological:     Mental Status: [name] and oriented to person, place, and time.     Cranial Nerves: No cranial nerve deficit.     Motor: No abnormal muscle tone.     Coordination: Coordination normal.     Comments: No ataxia on finger to nose bilaterally. No pronator drift. 5/5 strength throughout. CN  2-12 intact.Equal grip strength. Sensation intact.   Psychiatric:        Behavior: Behavior normal.     ED Results / Procedures / Treatments   Labs (all labs ordered are listed, but only abnormal results are displayed) Labs Reviewed  BASIC METABOLIC PANEL - Abnormal; Notable for the following components:      Result Value   Sodium 129 (*)    Potassium 5.7 (*)    Chloride 88 (*)    CO2 20 (*)    BUN 79 (*)    Creatinine, Ser 12.45 (*)    GFR,  Estimated 5 (*)    Anion gap 21 (*)    All other components within normal limits  D-DIMER, QUANTITATIVE - Abnormal; Notable for the following components:   D-Dimer, Quant 0.74 (*)    All other components within normal limits  CBC WITH DIFFERENTIAL/PLATELET - Abnormal; Notable for the following components:   RBC 3.49 (*)    Hemoglobin 11.4 (*)    HCT 33.9 (*)    All other components within normal limits  TROPONIN I (HIGH SENSITIVITY) - Abnormal; Notable for the following components:   Troponin I (High Sensitivity) 32 (*)    All other components within normal limits  TROPONIN I (HIGH SENSITIVITY) - Abnormal; Notable for the following components:   Troponin I (High Sensitivity) 32 (*)    All other components within normal limits  CBC WITH DIFFERENTIAL/PLATELET    EKG EKG Interpretation  Date/Time:  Tuesday Sep 23 2022 00:36:54 EDT Ventricular Rate:  90 PR Interval:  154 QRS Duration: 88 QT Interval:  380 QTC Calculation: 464 R Axis:   16 Text Interpretation: Normal sinus rhythm Left ventricular hypertrophy with repolarization abnormality ( R in aVL , Sokolow-Lyon , Romhilt-Estes ) Cannot rule out Septal infarct , age undetermined Abnormal ECG When compared with ECG of 17-Sep-2022 05:51, PREVIOUS ECG IS PRESENT No significant change was found Confirmed by Glynn Octave [phone]) on 09/23/2022 1:25:59 AM  Radiology CT Angio Chest/Abd/Pel for Dissection W and/or Wo Contrast  Result Date: 09/23/2022 CLINICAL DATA:  Chest pain with  shortness of breath. EXAM: CT ANGIOGRAPHY CHEST, ABDOMEN AND PELVIS TECHNIQUE: Noncontrast CT was performed additionally through the chest only. Multidetector CT imaging through the chest, abdomen and pelvis was performed using the standard protocol during bolus administration of intravenous contrast. Multiplanar reconstructed images and MIPs were obtained and reviewed to evaluate the vascular anatomy. RADIATION DOSE REDUCTION: This exam was performed according to the departmental dose-optimization program which includes automated exposure control, adjustment of the mA and/or kV according to patient size and/or use of iterative reconstruction technique. CONTRAST:  OMNIPAQUE IOHEXOL 350 MG/ML SOLN COMPARISON:  CTA chest, abdomen and pelvis 06/03/2022, CTA chest 03/02/2022 FINDINGS: CTA CHEST FINDINGS Cardiovascular: There is mild cardiomegaly with a left chamber predominance. Left ventricular wall hypertrophy appears similar. There is a circumferential pericardial effusion measuring 2.3 cm on the right and 1.3 cm on the left, increased since the last study. The pulmonary veins are upper limit of normal caliber. The pulmonary trunk again measures 4 cm indicating arterial hypertension. No arterial embolism is seen through the segmental levels on this nondedicated exam There are no visible coronary calcifications. The aorta and great vessels opacify well without visible plaques, aneurysms, stenosis or dissection, or wall hematoma. There is a normal variant origin of the left vertebral artery from the aortic arch. Mediastinum/Nodes: No enlarged mediastinal, hilar, or axillary lymph nodes. Thyroid gland and trachea demonstrate no significant findings. The esophagus is mildly patulous, with increased mild wall thickening distally probably related to reflux. The main bronchi are patent. Lungs/Pleura: There is no pleural effusion, thickening or pneumothorax. There is diffuse bronchial thickening. There is increased  interlobular septal thickening in the lung bases and apices consistent with mild interstitial edema. There are hazy opacities in the basal segments of the lower lobes which could be due to air trapping, pneumonitis or ground-glass edema. The lungs are otherwise clear. Musculoskeletal: No chest wall abnormality. No acute or significant osseous findings. Review of the MIP images confirms the above findings. CTA ABDOMEN AND PELVIS  FINDINGS VASCULAR Aorta: Normal. Celiac: Normal. SMA: Normal. Renals: The right renal artery is single. On the left there is a dominant artery supplying the upper, mid and lower pole and a small accessory artery to the inferior pole arising 2.6 cm inferior to it. All of them are widely patent. IMA: Normal. Inflow: There are scattered calcific plaques in the left internal iliac artery. The common iliac, internal and external arteries do not show evidence of flow-limiting stenosis or further visible plaques. The visible proximal outflow arteries are unremarkable. Veins: No obvious venous abnormality within the limitations of this arterial phase study. Review of the MIP images confirms the above findings. NON-VASCULAR Hepatobiliary: No focal liver abnormality is seen. No calcified gallstones, gallbladder wall thickening, or biliary dilatation. Pancreas: There is edema around the pancreas, but there is edema diffusely elsewhere in the abdomen and pelvis most likely congestive or from fluid overload. Laboratory and clinical correlation for pancreatitis is recommended. There is no pancreatic ductal dilatation or mass enhancement. Spleen: No abnormality. Adrenals/Urinary Tract: Both kidneys are atrophic. Small cysts are unchanged. No follow-up imaging is recommended. There is no adrenal mass. There is no urinary stone or obstruction. There is mild bladder thickening versus underdistention. Stomach/Bowel: Unremarkable gastric wall. Normal caliber small bowel. Mild-to-moderate fecal stasis ascending  and transverse colon. There is sigmoid diverticulosis without evidence of focal diverticulitis. There is wall thickening in the rectum and adjacent edema which could be dependent congestive changes or due to proctitis or infiltrating disease. Rest of the large bowel wall is unremarkable. The appendix is surgically absent. Lymphatic: Not optimally evaluated due to body wall and diffuse mesenteric edema. No obvious adenopathy. Reproductive: No prostatomegaly. Other: There is mild scattered ascites, but no drainable pocket. There is diffuse body wall and mesenteric edema likely congestive or from fluid overload. There is no free air, free hemorrhage or abscess. Musculoskeletal: No acute or significant osseous findings. Review of the MIP images confirms the above findings. IMPRESSION: 1. No findings of aortic aneurysm or dissection. No visible aortic plaques. 2. Cardiomegaly with left chamber predominance and left ventricular wall hypertrophy. 3. Prominent pulmonary trunk again noted. 4. Increased pericardial effusion. 5. Mild interstitial edema in the lung bases and apices. 6. Diffuse bronchial thickening, which could be due to bronchitis, congestive thickening or reactive airways disease. 7. Hazy opacities in the basal segments of the lower lobes which could be due to air trapping, pneumonitis or ground-glass edema. 8. Diffuse body wall and mesenteric edema likely congestive or from fluid overload. Small-volume ascites. 9. Edema around the pancreas which could be congestive or due to pancreatitis. 10. Cystitis versus bladder nondistention. 11. Rectal wall thickening and adjacent edema which could be dependent congestive changes or due to proctitis or infiltrating disease. 12. Diverticulosis without evidence of diverticulitis. Constipation. 13. Internal iliac atherosclerosis. Electronically Signed   By: Almira Bar M.D.   On: 09/23/2022 03:09   DG CHEST PORT 1 VIEW  Result Date: 09/23/2022 CLINICAL DATA:  Chest  pain and shortness of breath EXAM: PORTABLE CHEST 1 VIEW COMPARISON:  09/17/2019 FINDINGS: Cardiac shadow is enlarged but stable. Increased central vascular congestion is noted. No focal infiltrate is seen. No bony abnormality is noted. IMPRESSION: Increased vascular congestion without focal infiltrate. Electronically Signed   By: Alcide Clever M.D.   On: 09/23/2022 01:58    Procedures .Critical Care  Performed by: Glynn Octave, MD Authorized by: Glynn Octave, MD   Critical care provider statement:    Critical care time (minutes):  35   Critical care time was exclusive of:  Separately billable procedures and treating other patients   Critical care was necessary to treat or prevent imminent or life-threatening deterioration of the following conditions:  Renal failure (Hypertensive urgency, hyperkalemia)   Critical care was time spent personally by me on the following activities:  Development of treatment plan with patient or surrogate, discussions with consultants, evaluation of patient's response to treatment, examination of patient, ordering and review of laboratory studies, ordering and review of radiographic studies, ordering and performing treatments and interventions, pulse oximetry, re-evaluation of patient's condition, review of old charts and blood draw for specimens   I assumed direction of critical care for this patient from another provider in my specialty: no     Care discussed with: admitting provider       Medications Ordered in ED Medications - No data to display  ED Course/ Medical Decision Making/ A&P                             Medical Decision Making Amount and/or Complexity of Data Reviewed Labs: ordered. Decision-making details documented in ED Course. Radiology: ordered and independent interpretation performed. Decision-making details documented in ED Course. ECG/medicine tests: ordered and independent interpretation performed. Decision-making details  documented in ED Course.  Risk OTC drugs. Prescription drug management. Decision regarding hospitalization.   ESRD patient with sudden onset chest pain or shortness of breath this evening.  Hypertensive on arrival.  Will give his home medications.  EKG shows LVH without acute ischemia.  Patient quite hypertensive upon arrival.  He is given IV hydralazine, p.o. amlodipine, clonidine.  Chest x-ray shows volume overload without pneumothorax or pneumonia.  Labs reassuring.  Mild hypokalemia 5.7.  EKG without acute ischemic changes or hyperkalemic changes Dose of Lokelma given CTA is obtained given patient's severe hypertension with chest pain that radiates to his back.  This is negative for aortic aneurysm or dissection.  Does show evidence of volume overload.  Interstitial edema, pericardial effusion.  Patient would benefit from dialysis later this morning.  Suspect his chest pain is likely secondary to hypertensive urgency and volume overload. Troponin minimally elevated.  Plan admission for hypertensive urgency for dialysis later this morning.  He is given his home blood pressure medications including clonidine, hydralazine and amlodipine.  Suspect his chest pain is likely secondary to hypertensive urgency and volume overload.  Admission discussed with family practice residents Discussed with Dr. Marisue Humble of nephrology as well who will arrange for dialysis later this morning.  Hyperkalemia is treated.       Final Clinical Impression(s) / ED Diagnoses Final diagnoses:  Hypertensive urgency  Hypervolemia, unspecified hypervolemia type  Atypical chest pain    Rx / DC Orders ED Discharge Orders     None         Hatem Cull, Jeannett Senior, MD 09/23/22 [phone]

## 2022-09-23 NOTE — ED Triage Notes (Signed)
Patient coming to ED for evaluation of chest pain and shortness of breath.  Patient holding L chest and states "it started hurting when I was trying to catch my breathe."  Will not answer all questions.  Unable to determine when last dialysis section was.  Attempted multiple times to complete all triage questions.  Pt continues to state "Oh God, Oh God."

## 2022-09-23 NOTE — Assessment & Plan Note (Signed)
On TTS schedule, reports no missed dialysis sessions.  As above we will consult nephrology for HD continuation while admitted.

## 2022-09-23 NOTE — Discharge Summary (Signed)
Family Medicine Teaching [name] Discharge Summary  Patient name: [name] Medical record number: [id number] Date of birth: 1985/08/23 Age: 37 y.o. Gender: male Date of Admission: 09/23/2022  Date of Discharge: 09/23/2022 Admitting Physician: Hannah Beat, MD  Primary Care Provider: Sabino Dick, DO Consultants: Nephrology (dialysis only)  Indication for Hospitalization: Chest pain  Brief Hospital Course:  [name] is a 37 year old male with history of ESRD on HD TTS, Alport syndrome, HTN, HFmrEF, who was admitted for evaluation of chest pain and hypertension.  His hospital course is outlined below.  Chest pain Patient has known chronic chest pain, typically intermittent.  High risk factors for MI, admitted for observation.  Troponins trended flat, EKG without ischemia.  Symptoms of eructation and burning consistent with GERD, however patient declined his Protonix.  Additionally, chest pain resolved prior to receiving dialysis on 09/23/2022.  At this time, suspect chest pain most likely GERD.  Additionally, recommend cardiology follow-up and consider stress test.  Hypertension Chronic and resistant hypertension secondary to advanced renal disease from Alport syndrome.  Admission blood pressure of 210/140.  No evidence of encephalopathy, no focal neurologic deficits.  No evidence of endorgan damage, troponins trended flat and patient is already ESRD on HD. Recently stopped taking carvedilol approximately 1 month ago due to side effects.  On amlodipine, losartan, clonidine and hydralazine outpatient which were continued during admission.  He received dialysis, but did not receive full treatment.  At time of discharge pressures were still elevated, however near his baseline. Instructed to take blood pressure medication and patient scheduled for close follow-up with PCP.  ESRD Mild bilateral lower extremity edema.  Patient received partial dialysis, discontinued early  as patient was eager to leave for appointment with transplant clinic.  Nephrology aware, felt he was stable and okay to end early.  Plan for dialysis on Thursday outpatient.  Other chronic conditions were medically managed with home medications and formulary alternatives as necessary (Migraine)  Follow-up recommendations Ensure patient compliance with blood pressure medications Revisit migraine treatment, ensure compliance with medication May benefit from outpatient stress test to evaluate intermittent chest pain May need assistance with rescheduling transplant clinic appointment  Discharge Diagnoses/Problem List:  Principal Problem:   Chest pain Active Problems:   Hyperkalemia   Severe uncontrolled hypertension   ESRD (end stage renal disease) (HCC)   Headache   Volume overload   ESRD (end stage renal disease) on dialysis [facility])   Atypical chest pain  Disposition: Home  Discharge Condition: Stable  Discharge Exam: Per my progress note on 09/23/2022 General: Sitting up in bed, NAD, anxious affect Cardio: RRR Pulmonary: CTAB, normal work of breathing on room air Abdominal: Soft, nontender, distended Skin: Warm and dry  Significant Procedures: None  Significant Labs and Imaging:  Recent Labs  Lab 09/23/22 0236  WBC 5.5  HGB 11.4*  HCT 33.9*  PLT 184   Recent Labs  Lab 09/23/22 0128  NA 129*  K 5.7*  CL 88*  CO2 20*  GLUCOSE 74  BUN 79*  CREATININE 12.45*  CALCIUM 9.1    Results/Tests Pending at Time of Discharge: None  Discharge Medications:  Allergies as of 09/23/2022       Reactions   Zestril [lisinopril] Swelling   Nsaids Other (See Comments)   "Kidney problems "   Risperdal [risperidone] Other (See Comments)   Unknown reaction         Medication List     STOP taking these medications  carvedilol 25 MG tablet Commonly known as: COREG       TAKE these medications    acetaminophen 325 MG tablet Commonly known as: TYLENOL Take 2  tablets (650 mg total) by mouth every 6 (six) hours as needed for mild pain (or Fever >/= 101).   amLODipine 10 MG tablet Commonly known as: NORVASC TAKE 1 TABLET BY MOUTH EVERY DAY   calcitRIOL 0.25 MCG capsule Commonly known as: ROCALTROL Take 0.25 mcg by mouth daily.   calcium acetate 667 MG capsule Commonly known as: PHOSLO Take 1 capsule (667 mg total) by mouth 3 (three) times daily with meals. What changed: how much to take   cloNIDine 0.1 MG tablet Commonly known as: Catapres Take 1 tablet (0.1 mg total) by mouth 2 (two) times daily.   divalproex 500 MG 24 hr tablet Commonly known as: DEPAKOTE ER Take 1 tablet (500 mg total) by mouth at bedtime.   hydrALAZINE 100 MG tablet Commonly known as: APRESOLINE Take 1 tablet (100 mg total) by mouth 3 (three) times daily.   losartan 50 MG tablet Commonly known as: COZAAR Take 1 tablet (50 mg total) by mouth daily.   pantoprazole 40 MG tablet Commonly known as: PROTONIX TAKE 1 TABLET BY MOUTH EVERY DAY   penicillin v potassium 500 MG tablet Commonly known as: VEETID Take 1 tablet (500 mg total) by mouth 4 (four) times daily for 10 days.   SUMAtriptan 50 MG tablet Commonly known as: IMITREX Take 1 tablet (50 mg total) by mouth every 2 (two) hours as needed for migraine. Do not take more than 4 tablets in 24 hours.   triamcinolone ointment 0.1 % Commonly known as: KENALOG Apply to scrotum twice daily for itching. Do not use for more than 2 weeks continuously        Discharge Instructions: Please refer to Patient Instructions section of EMR for full details.  Patient was counseled important signs and symptoms that should prompt return to medical care, changes in medications, dietary instructions, activity restrictions, and follow up appointments.   Follow-Up Appointments:  Follow-up Information     Sabino Dick, DO Follow up.   Specialty: Family Medicine Why: Please go to appointment with PCP office on 5/16.   Appointment with Dr. Velna Ochs at [place] information: [address] [place] [address] [phone]                 Tiffany Kocher, DO 09/23/2022, 4:27 PM PGY-1, [facility] Family Medicine

## 2022-09-23 NOTE — Procedures (Signed)
   I was present at this dialysis session, have reviewed the session itself and made  appropriate changes Vinson Moselle MD [facility] pager [phone]   09/23/2022, 1:30 PM

## 2022-09-23 NOTE — Assessment & Plan Note (Addendum)
Patient has daily headaches localizing to neck.  Patient went to [facility] clinic yesterday in which physician stated most likely not any more serious acute intracranial pathology and most likely related to stress and muscle tension given relief with ice packs and conservative measures.  Recommended to continue Excedrin as needed and heat packs to the neck.  No abnormalities on neurological exam that would raise concern for CVA.  Obtained CT head in the ED due to severe headache. - CT head pending - Tylenol as needed for pain -- continue home valproic acid

## 2022-09-23 NOTE — H&P (Addendum)
Hospital Admission History and Physical Service Pager: [phone]  Patient name: [name] Medical record number: [id number] Date of Birth: Jul 05, 1985 Age: 37 y.o. Gender: male  Primary Care Provider: Sabino Dick, DO Consultants: NONE Code Status: FULL Preferred Emergency Contact: [name],[name] (Spouse): [phone]   Chief Complaint: chest pain  Assessment and Plan: KAJ TODOROFF is a 37 y.o. male presenting with chest pain, dyspnea, and headache. Differential for this patient's presentation of this includes symptoms secondary to his chronic medical condition of ESRD, MI, aortic dissection.  Symptoms most likely due to his ESRD and less likely MI given EKG findings and negative chest x-ray.  * Chest pain Patient reports chest pain and dyspnea that started this evening. Patient went to the [facility] clinic yesterday for occipital headache which seemed mostly related to stress and muscle tension. He is tachycardiac with elevated BP in the 200s/140s. EKG shows LVH without acute ischemia. CTA shows no findings of aortic aneurysm or dissection. Patient was very anxious and stated he has SOB which improved when he calmed down. Troponin x2 is stable at 32.  At this time chest pain and dyspnea is most likely related to some element of volume overload secondary to his ESRD. He is scheduled for dialysis on TTS.  Will admit patient to get nonemergent dialysis, unfortunately unable to arrange for ED dialysis overnight per ED provider.  Monitor for symptom improvement. - Admit to FMTS, attending Dr. Jennette Kettle - nephrology consulted in the ED for nonemergent dialysis - Vital signs - Pulse ox - AM CBC, CMP  Hyperkalemia Patient's potassium was 5.7 on admission.  Was given  Lokelma 10 g and calcium gluconate in the ED.  This is in the setting of Alport syndrome.  Will likely resolve after dialysis scheduled for today. - AM CMP  Hypertension Patient has hypertension which is chronically high  and can be up to 190 systolic.  Follows with cardiology.  On admission, blood pressure was in the 190s/110s.  Patient reports taking his medications as prescribed.  On exam, patient is tachycardic and anxious.  No need for aggressive BP control with continuous antihypertensive drip or as needed antihypertensive at this time.  He is prescribed carvedilol but appears he has not taken this in over a month due to side effects. -S/p IV hydralazine and IV labetalol in the ED - Continue to monitor - continue home blood pressure medications: amlodipine 10 mg, hydralazine 100 mg TID, losartan 50 mg, clonidine 0.1 mg BID - consider restarting carvedilol or switching to a different beta-blocker  ESRD (end stage renal disease) (HCC) On TTS schedule, reports no missed dialysis sessions.  As above we will consult nephrology for HD continuation while admitted.  Headache Patient has daily headaches localizing to neck.  Patient went to [facility] clinic yesterday in which physician stated most likely not any more serious acute intracranial pathology and most likely related to stress and muscle tension given relief with ice packs and conservative measures.  Recommended to continue Excedrin as needed and heat packs to the neck.  No abnormalities on neurological exam that would raise concern for CVA.  Obtained CT head in the ED due to severe headache. - CT head pending - Tylenol as needed for pain -- continue home valproic acid     FEN/GI: Renal with fluid restriction VTE Prophylaxis: Heparin  Disposition: MedSurg, possible discharge after dialysis today  History of Present Illness:  [name] is a 37 y.o. male presenting with headache and  chest pain.  He reports having chest pain and SOB. He is scheduled for dialysis later today (TTS schedule). He feels like his chest is "caving in".  Patient had difficulty answering questions during the assessment as he is hard of hearing along with being anxious.  Throughout  the interview, patient would ask for oxygen stating since "I am a dialysis patient, the oxygen readings are not accurate".    Patient reports he has a severe headache and facial numbness (in his lips). Reports lips are numb to the point they were bleeding although no blood was seen on exam. Reports associated pain traveling to the back of his neck. Of note patient went to the [facility] clinic yesterday due to headaches.  Patient hopeful to be discharged for tomorrow, reports he has an important appointment.  In the ED, chest x-ray, CTA for dissection, and CT head ordered.  Given 1 dose amlodipine 10 mg p.o., clonidine point 1 mg p.o., hydralazine 10 mg IV, hydralazine 5 mg IV, labetalol 20 mg IV, nitroglycerin 0.4 mg.  Potassium found to be 5.7, given Lokelma 10 g.   Review Of Systems: Per HPI with the following additions: anxiety  Pertinent Past Medical History:  HTN, HFmrEF, Alport syndrome, ESRD on HD TTS   Pertinent Past Surgical History:  APPENDECTOMY       AV FISTULA PLACEMENT Left 03/03/2019    Procedure: ARTERIOVENOUS (AV) FISTULA CREATION LEFT ARM;  Surgeon: Maeola Harman, MD;  Location: [facility] OR;  Service: Vascular;  Laterality: Left;   BASCILIC VEIN TRANSPOSITION Right 04/12/2019    Procedure: RIGHT UPPER EXTREMITY BASCILIC VEIN TRANSPOSITION FIRST STAGE FISTULA;  Surgeon: Chuck Hint, MD;  Location: [facility] OR;  Service: Vascular;  Laterality: Right;   BIOPSY   10/14/2021    Procedure: BIOPSY;  Surgeon: Sherrilyn Rist, MD;  Location: [facility] ENDOSCOPY;  Service: Gastroenterology;;   BIOPSY   06/06/2022    Procedure: BIOPSY;  Surgeon: Napoleon Form, MD;  Location: [facility] ENDOSCOPY;  Service: Gastroenterology;;   COLONOSCOPY WITH PROPOFOL N/A 10/14/2021    Procedure: COLONOSCOPY WITH PROPOFOL;  Surgeon: Sherrilyn Rist, MD;  Location: [facility] ENDOSCOPY;  Service: Gastroenterology;  Laterality: N/A;   ESOPHAGOGASTRODUODENOSCOPY (EGD) WITH PROPOFOL N/A 06/06/2022    Procedure:  ESOPHAGOGASTRODUODENOSCOPY (EGD) WITH PROPOFOL;  Surgeon: Napoleon Form, MD;  Location: [facility] ENDOSCOPY;  Service: Gastroenterology;  Laterality: N/A;   FLEXIBLE SIGMOIDOSCOPY N/A 04/14/2022    Procedure: FLEXIBLE SIGMOIDOSCOPY;  Surgeon: Napoleon Form, MD;  Location: [facility] ENDOSCOPY;  Service: Gastroenterology;  Laterality: N/A;   LIGATION OF ARTERIOVENOUS  FISTULA Left 03/06/2019    Procedure: LIGATION OF ARTERIOVENOUS  FISTULA;  Surgeon: Cephus Shelling, MD;  Location: [facility] OR;  Service: Vascular;  Laterality: Left;   spinal tap       SPINE SURGERY        related to a spinal infection, unsure of surgery or infection source   WISDOM TOOTH EXTRACTION      Pertinent Social History: Tobacco use: Yes/No/Former Alcohol use: Not currently Other Substance use: Denies Lives with self  Pertinent Family History: Mother - diabetes, HTN, kidney failure; father - CV disease, hearing loss; maternal grandfather - stroke; maternal grandmother - CV disease; sister - HTN.   Remainder reviewed in history tab.   Important Outpatient Medications: amlodipine 10 mg daily, losartan 50 mg daily, carvedilol 25 mg BID, hydralazine 100 mg TID. Patient takes all meds after dialysis on dialysis days (T, Th, S) and in the AM  on non-dialysis days.   Objective: BP (!) 231/107   Pulse 93   Temp 98.7 F (37.1 C) (Oral)   Resp (!) 27   Ht 5\' 8"  (1.727 m)   Wt 78.9 kg   SpO2 100%   BMI 26.45 kg/m  Exam: General: Anxious male in bed. In acute distress. CV: Regular rate, tachycardic. No murmurs, rubs, or gallops. No LE edema.  Chest pain reproducible on palpation. Pulmonary: Lungs CTAB. Normal effort. No wheezing or rales. Abdominal: Soft, nontender, nondistended. Normal bowel sounds. Skin: Warm and dry. No obvious rash or lesions. Neuro: A&Ox3. Moves all extremities. Normal sensation. No focal deficit.   Labs:  CBC BMET  Recent Labs  Lab 09/23/22 0236  WBC 5.5  HGB 11.4*  HCT 33.9*  PLT 184    Recent Labs  Lab 09/23/22 0128  NA 129*  K 5.7*  CL 88*  CO2 20*  BUN 79*  CREATININE 12.45*  GLUCOSE 74  CALCIUM 9.1    Troponin x 2 is 32 D-dimer 0.74  EKG: Normal sinus with LVH   Imaging Studies Performed:  Chest x-ray IMPRESSION: Increased vascular congestion without focal infiltrate.  CTA chest/abdomen/pelvis for dissection IMPRESSION: 1. No findings of aortic aneurysm or dissection. No visible aortic plaques. 2. Cardiomegaly with left chamber predominance and left ventricular wall hypertrophy. 3. Prominent pulmonary trunk again noted. 4. Increased pericardial effusion. 5. Mild interstitial edema in the lung bases and apices. 6. Diffuse bronchial thickening, which could be due to bronchitis, congestive thickening or reactive airways disease. 7. Hazy opacities in the basal segments of the lower lobes which could be due to air trapping, pneumonitis or ground-glass edema. 8. Diffuse body wall and mesenteric edema likely congestive or from fluid overload. Small-volume ascites. 9. Edema around the pancreas which could be congestive or due to pancreatitis. 10. Cystitis versus bladder nondistention. 11. Rectal wall thickening and adjacent edema which could be dependent congestive changes or due to proctitis or infiltrating disease. 12. Diverticulosis without evidence of diverticulitis. Constipation. 13. Internal iliac atherosclerosis.   Lance Muss, MD 09/23/2022, 5:29 AM PGY-1, [facility] Family Medicine  FPTS Intern pager: [phone], text pages welcome Secure chat group [facility] Teaching Service   Upper Level Addendum:  I have reviewed the above note, making necessary revisions as appropriate.  I agree with the medical decision making and physical exam by the resident as noted above.  Littie Deeds, MD PGY-3 [facility] Family Medicine Residency

## 2022-09-23 NOTE — Hospital Course (Addendum)
[name] is a 37 year old male with history of ESRD on HD TTS, Alport syndrome, HTN, HFmrEF, who was admitted for evaluation of chest pain and hypertension.  His hospital course is outlined below.  Chest pain Patient has known chronic chest pain, typically intermittent.  High risk factors for MI, admitted for observation.  Troponins trended flat, EKG without ischemia.  Symptoms of eructation and burning consistent with GERD, however patient declined his Protonix.  Additionally, chest pain resolved prior to receiving dialysis on 09/23/2022.  At this time, suspect chest pain most likely GERD.  Additionally, recommend cardiology follow-up and consider stress test.  Hypertension Chronic and resistant hypertension secondary to advanced renal disease from Alport syndrome.  Admission blood pressure of 210/140.  No evidence of encephalopathy, no focal neurologic deficits.  No evidence of endorgan damage, troponins trended flat and patient is already ESRD on HD. Recently stopped taking carvedilol approximately 1 month ago due to side effects.  On amlodipine, losartan, clonidine and hydralazine outpatient which were continued during admission.  He received dialysis, but did not receive full treatment.  At time of discharge pressures were still elevated, however near his baseline. Instructed to take blood pressure medication and patient scheduled for close follow-up with PCP.  ESRD Mild bilateral lower extremity edema.  Elevated potassium, received calcium gluconate and Lokelma. Patient received partial dialysis, discontinued early as patient was eager to leave for appointment with transplant clinic.  Nephrology aware, felt he was stable and okay to end early.  Plan for dialysis on Thursday outpatient.  Other chronic conditions were medically managed with home medications and formulary alternatives as necessary (Migraine)  Follow-up recommendations Ensure patient compliance with blood pressure  medications Revisit migraine treatment, ensure compliance with medication May benefit from outpatient stress test to evaluate intermittent chest pain May need assistance with rescheduling transplant clinic appointment

## 2022-09-23 NOTE — Progress Notes (Signed)
Patient is not cooperative with leaving telemetry on

## 2022-09-23 NOTE — Progress Notes (Signed)
Family medicine team paged and sent a message on EPIC chat at 0825 to alert them to patient complaints of a sudden splitting headache and BP of 202/122.  Requested provider to come to the bedside.  Dr. Claudean Severance stated, "patient has difficult to control daily migraines". States compazine may be ordered to help. Dr. Melissa Noon states, "BP is not as concerning because his BP has been that high since admission." I made both providers aware that I am not comfortable with a pressure that high seeing as we are an observation unit. Dr. Melissa Noon states transfer orders will be put in and will hold on ordering PRN BP meds until a provider sees the patient.   Dr. Claudean Severance arrived to the unit and told us that the patient is okay with Tylenol for his headache and that the patient wants his IV out. MD states that this patient is well known at the clinic and this is his baseline BP and headache complaints. MD states patient may discharge after dialysis and that the IV can be taken out per patient request.   Patient had removed all of his telemetry and is walking around the room without a gown on.   Asked dialysis when the patient would be going, and they stated they don't know.

## 2022-09-23 NOTE — Progress Notes (Signed)
Received page from RN with concern about BP with systolics in 200s.  Patient has remained hypertensive since admission at this level. He is chronically 190s/110s. All home antihypertensives are ordered, except for carvedilol which he has side effects from.  Changed LOC to Progressive, per RN comfort with BP numbers. Also placed transfer order.  Dr. Claudean Severance on his way to bedside to assess and give PRN antihypertensive if necessary.  Patient with headache, which has been chronic for months. CT head at 0515 negative.  Darral Dash, DO PGY-2 [facility] Family Medicine

## 2022-09-23 NOTE — Discharge Instructions (Addendum)
Dear [name],  Thank you for letting us participate in your care. You were hospitalized for chest pain. Currently we believe it was likely heart burn. Continue to take your protonix at home.  POST-HOSPITAL & CARE INSTRUCTIONS Go to your follow up appointments Continue to take your home blood pressure medications, we recommend you follow-up with your cardiologist. Continue to take your home migraine medication.  DOCTOR'S APPOINTMENT   Future Appointments  Date Time Provider Department Center  09/25/2022 10:15 AM Celine Mans, MD [facility]  10/01/2022 11:30 AM Sabino Dick, DO [facility]    Take care and be well!  Family Medicine Teaching Service Inpatient Team [place]  [facility]  [address] [place], [place] [address] [phone]

## 2022-09-23 NOTE — ED Notes (Signed)
ED TO INPATIENT HANDOFF REPORT  ED Nurse Name and Phone #: Grant Fontana PM [phone]  S Name/Age/Gender [name] 37 y.o. male Room/Bed: 032C/032C  Code Status   Code Status: Full Code  Home/SNF/Other Home Patient oriented to: self, place, time, and situation Is this baseline? Yes   Triage Complete: Triage complete  Chief Complaint Acute blood loss anemia [D62] Volume overload [E87.70]  Triage Note Patient coming to ED for evaluation of chest pain and shortness of breath.  Patient holding L chest and states "it started hurting when I was trying to catch my breathe."  Will not answer all questions.  Unable to determine when last dialysis section was.  Attempted multiple times to complete all triage questions.  Pt continues to state "Oh God, Oh God."   Allergies Allergies  Allergen Reactions   Zestril [Lisinopril] Swelling   Nsaids Other (See Comments)    "Kidney problems "   Risperdal [Risperidone] Other (See Comments)    Unknown reaction     Level of Care/Admitting Diagnosis ED Disposition     ED Disposition  Admit   Condition  --   Comment  Hospital Area: [facility] [[id number]]  Level of Care: Med-Surg [16]  May place patient in observation at [facility] or [facility] if equivalent level of care is available:: Yes  Covid Evaluation: Asymptomatic - no recent exposure (last 10 days) testing not required  Diagnosis: Volume overload [454098]  Admitting Physician: Lance Muss [[id number]]  Attending Physician: Nestor Ramp [[id number]]          B Medical/Surgery History Past Medical History:  Diagnosis Date   Bipolar 1 disorder (HCC)    CKD (chronic kidney disease)    on dialysis   Depression    GERD (gastroesophageal reflux disease)    Hearing difficulty of left ear    75% hearing   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 OR;  Service: Vascular;  Laterality: Left;   BASCILIC VEIN TRANSPOSITION Right 04/12/2019   Procedure: RIGHT UPPER EXTREMITY BASCILIC VEIN TRANSPOSITION FIRST STAGE FISTULA;  Surgeon: Chuck Hint, MD;  Location: [facility] OR;  Service: Vascular;  Laterality: Right;   BIOPSY  10/14/2021   Procedure: BIOPSY;  Surgeon: Sherrilyn Rist, MD;  Location: [facility]COPY;  Service: Gastroenterology;;   BIOPSY  06/06/2022   Procedure: BIOPSY;  Surgeon: Napoleon Form, MD;  Location: [facility];  Service: Gastroenterology;;   COLONOSCOPY WITH PROPOFOL N/A 10/14/2021   Procedure: COLONOSCOPY WITH PROPOFOL;  Surgeon: Sherrilyn Rist, MD;  Location: [facility];  Service: Gastroenterology;  Laterality: N/A;   ESOPHAGOGASTRODUODENOSCOPY (EGD) WITH PROPOFOL N/A 06/06/2022   Procedure: ESOPHAGOGASTRODUODENOSCOPY (EGD) WITH PROPOFOL;  Surgeon: Napoleon Form, MD;  Location: [facility];  Service: Gastroenterology;  Laterality: N/A;   FLEXIBLE SIGMOIDOSCOPY N/A 04/14/2022   Procedure: FLEXIBLE SIGMOIDOSCOPY;  Surgeon: Napoleon Form, MD;  Location: [facility];  Service: Gastroenterology;  Laterality: N/A;   LIGATION OF ARTERIOVENOUS  FISTULA Left 03/06/2019   Procedure: LIGATION OF ARTERIOVENOUS  FISTULA;  Surgeon: Cephus Shelling, MD;  Location: [facility] OR;  Service: Vascular;  Laterality: Left;  spinal tap     SPINE SURGERY     related to a spinal infection, unsure of surgery or infection source   WISDOM TOOTH EXTRACTION       A IV Location/Drains/Wounds Patient Lines/Drains/Airways Status     Active Line/Drains/Airways     Name Placement date Placement time Site Days   Peripheral IV 09/23/22 18 G 1.16" Anterior;Left;Upper Arm 09/23/22  0153  Arm  less than 1   Fistula / Graft Right Upper arm Arteriovenous  fistula 04/12/19  1105  Upper arm  1260            Intake/Output Last 24 hours  Intake/Output Summary (Last 24 hours) at 09/23/2022 0537 Last data filed at 09/23/2022 0500 Gross per 24 hour  Intake 50 ml  Output --  Net 50 ml    Labs/Imaging Results for orders placed or performed during the hospital encounter of 09/23/22 (from the past 48 hour(s))  Basic metabolic panel     Status: Abnormal   Collection Time: 09/23/22  1:28 AM  Result Value Ref Range   Sodium 129 (L) 135 - 145 mmol/L   Potassium 5.7 (H) 3.5 - 5.1 mmol/L   Chloride 88 (L) 98 - 111 mmol/L   CO2 20 (L) 22 - 32 mmol/L   Glucose, Bld 74 70 - 99 mg/dL    Comment: Glucose reference range applies only to samples taken after fasting for at least 8 hours.   BUN 79 (H) 6 - 20 mg/dL   Creatinine, Ser 16.10 (H) 0.61 - 1.24 mg/dL   Calcium 9.1 8.9 - 96.0 mg/dL   GFR, Estimated 5 (L) >60 mL/min    Comment: (NOTE) Calculated using the CKD-EPI Creatinine Equation (2021)    Anion gap 21 (H) 5 - 15    Comment: ELECTROLYTES REPEATED TO VERIFY Performed at [facility], [address] [place], [place] [address]   Troponin I (High Sensitivity)     Status: Abnormal   Collection Time: 09/23/22  1:28 AM  Result Value Ref Range   Troponin I (High Sensitivity) 32 (H) <18 ng/L    Comment: (NOTE) Elevated high sensitivity troponin I (hsTnI) values and significant  changes across serial measurements may suggest ACS but many other  chronic and acute conditions are known to elevate hsTnI results.  Refer to the "Links" section for chest pain algorithms and additional  guidance. Performed at [facility], [address] [place], [place] [address]   D-dimer, quantitative     Status: Abnormal   Collection Time: 09/23/22  1:28 AM  Result Value Ref Range   D-Dimer, Quant 0.74 (H) 0.00 - 0.50 ug/mL-FEU    Comment: (NOTE) At the manufacturer cut-off value of 0.5 g/mL FEU, this assay has a negative predictive value of  95-100%.This assay is intended for use in conjunction with a clinical pretest probability (PTP) assessment model to exclude pulmonary embolism (PE) and deep venous thrombosis (DVT) in outpatients suspected of PE or DVT. Results should be correlated with clinical presentation. Performed at [facility], [address], [place], [place] [address]   CBC with Differential/Platelet     Status: Abnormal   Collection Time: 09/23/22  2:36 AM  Result Value Ref Range   WBC 5.5 4.0 - 10.5 K/uL   RBC 3.49 (L) 4.22 - 5.81 MIL/uL   Hemoglobin 11.4 (L) 13.0 - 17.0 g/dL   HCT 56.2 (L) 13.0 - 86.5 %   MCV 97.1 80.0 - 100.0 fL  MCH 32.7 26.0 - 34.0 pg   MCHC 33.6 30.0 - 36.0 g/dL   RDW 16.1 09.6 - 04.5 %   Platelets 184 150 - 400 K/uL   nRBC 0.0 0.0 - 0.2 %   Neutrophils Relative % 62 %   Neutro Abs 3.5 1.7 - 7.7 K/uL   Lymphocytes Relative 25 %   Lymphs Abs 1.4 0.7 - 4.0 K/uL   Monocytes Relative 8 %   Monocytes Absolute 0.4 0.1 - 1.0 K/uL   Eosinophils Relative 4 %   Eosinophils Absolute 0.2 0.0 - 0.5 K/uL   Basophils Relative 1 %   Basophils Absolute 0.1 0.0 - 0.1 K/uL   Immature Granulocytes 0 %   Abs Immature Granulocytes 0.02 0.00 - 0.07 K/uL    Comment: Performed at [facility], [address] [place], [place] [address]  Troponin I (High Sensitivity)     Status: Abnormal   Collection Time: 09/23/22  3:39 AM  Result Value Ref Range   Troponin I (High Sensitivity) 32 (H) <18 ng/L    Comment: (NOTE) Elevated high sensitivity troponin I (hsTnI) values and significant  changes across serial measurements may suggest ACS but many other  chronic and acute conditions are known to elevate hsTnI results.  Refer to the "Links" section for chest pain algorithms and additional  guidance. Performed at [facility], [address] [place], [place] [address]    CT Angio Chest/Abd/Pel for Dissection W and/or Wo Contrast  Result Date: 09/23/2022 CLINICAL DATA:  Chest pain  with shortness of breath. EXAM: CT ANGIOGRAPHY CHEST, ABDOMEN AND PELVIS TECHNIQUE: Noncontrast CT was performed additionally through the chest only. Multidetector CT imaging through the chest, abdomen and pelvis was performed using the standard protocol during bolus administration of intravenous contrast. Multiplanar reconstructed images and MIPs were obtained and reviewed to evaluate the vascular anatomy. RADIATION DOSE REDUCTION: This exam was performed according to the departmental dose-optimization program which includes automated exposure control, adjustment of the mA and/or kV according to patient size and/or use of iterative reconstruction technique. CONTRAST:  OMNIPAQUE IOHEXOL 350 MG/ML SOLN COMPARISON:  CTA chest, abdomen and pelvis 06/03/2022, CTA chest 03/02/2022 FINDINGS: CTA CHEST FINDINGS Cardiovascular: There is mild cardiomegaly with a left chamber predominance. Left ventricular wall hypertrophy appears similar. There is a circumferential pericardial effusion measuring 2.3 cm on the right and 1.3 cm on the left, increased since the last study. The pulmonary veins are upper limit of normal caliber. The pulmonary trunk again measures 4 cm indicating arterial hypertension. No arterial embolism is seen through the segmental levels on this nondedicated exam There are no visible coronary calcifications. The aorta and great vessels opacify well without visible plaques, aneurysms, stenosis or dissection, or wall hematoma. There is a normal variant origin of the left vertebral artery from the aortic arch. Mediastinum/Nodes: No enlarged mediastinal, hilar, or axillary lymph nodes. Thyroid gland and trachea demonstrate no significant findings. The esophagus is mildly patulous, with increased mild wall thickening distally probably related to reflux. The main bronchi are patent. Lungs/Pleura: There is no pleural effusion, thickening or pneumothorax. There is diffuse bronchial thickening. There is  increased interlobular septal thickening in the lung bases and apices consistent with mild interstitial edema. There are hazy opacities in the basal segments of the lower lobes which could be due to air trapping, pneumonitis or ground-glass edema. The lungs are otherwise clear. Musculoskeletal: No chest wall abnormality. No acute or significant osseous findings. Review of the MIP  images confirms the above findings. CTA ABDOMEN AND PELVIS FINDINGS VASCULAR Aorta: Normal. Celiac: Normal. SMA: Normal. Renals: The right renal artery is single. On the left there is a dominant artery supplying the upper, mid and lower pole and a small accessory artery to the inferior pole arising 2.6 cm inferior to it. All of them are widely patent. IMA: Normal. Inflow: There are scattered calcific plaques in the left internal iliac artery. The common iliac, internal and external arteries do not show evidence of flow-limiting stenosis or further visible plaques. The visible proximal outflow arteries are unremarkable. Veins: No obvious venous abnormality within the limitations of this arterial phase study. Review of the MIP images confirms the above findings. NON-VASCULAR Hepatobiliary: No focal liver abnormality is seen. No calcified gallstones, gallbladder wall thickening, or biliary dilatation. Pancreas: There is edema around the pancreas, but there is edema diffusely elsewhere in the abdomen and pelvis most likely congestive or from fluid overload. Laboratory and clinical correlation for pancreatitis is recommended. There is no pancreatic ductal dilatation or mass enhancement. Spleen: No abnormality. Adrenals/Urinary Tract: Both kidneys are atrophic. Small cysts are unchanged. No follow-up imaging is recommended. There is no adrenal mass. There is no urinary stone or obstruction. There is mild bladder thickening versus underdistention. Stomach/Bowel: Unremarkable gastric wall. Normal caliber small bowel. Mild-to-moderate fecal stasis  ascending and transverse colon. There is sigmoid diverticulosis without evidence of focal diverticulitis. There is wall thickening in the rectum and adjacent edema which could be dependent congestive changes or due to proctitis or infiltrating disease. Rest of the large bowel wall is unremarkable. The appendix is surgically absent. Lymphatic: Not optimally evaluated due to body wall and diffuse mesenteric edema. No obvious adenopathy. Reproductive: No prostatomegaly. Other: There is mild scattered ascites, but no drainable pocket. There is diffuse body wall and mesenteric edema likely congestive or from fluid overload. There is no free air, free hemorrhage or abscess. Musculoskeletal: No acute or significant osseous findings. Review of the MIP images confirms the above findings. IMPRESSION: 1. No findings of aortic aneurysm or dissection. No visible aortic plaques. 2. Cardiomegaly with left chamber predominance and left ventricular wall hypertrophy. 3. Prominent pulmonary trunk again noted. 4. Increased pericardial effusion. 5. Mild interstitial edema in the lung bases and apices. 6. Diffuse bronchial thickening, which could be due to bronchitis, congestive thickening or reactive airways disease. 7. Hazy opacities in the basal segments of the lower lobes which could be due to air trapping, pneumonitis or ground-glass edema. 8. Diffuse body wall and mesenteric edema likely congestive or from fluid overload. Small-volume ascites. 9. Edema around the pancreas which could be congestive or due to pancreatitis. 10. Cystitis versus bladder nondistention. 11. Rectal wall thickening and adjacent edema which could be dependent congestive changes or due to proctitis or infiltrating disease. 12. Diverticulosis without evidence of diverticulitis. Constipation. 13. Internal iliac atherosclerosis. Electronically Signed   By: Almira Bar M.D.   On: 09/23/2022 03:09   DG CHEST PORT 1 VIEW  Result Date: 09/23/2022 CLINICAL  DATA:  Chest pain and shortness of breath EXAM: PORTABLE CHEST 1 VIEW COMPARISON:  09/17/2019 FINDINGS: Cardiac shadow is enlarged but stable. Increased central vascular congestion is noted. No focal infiltrate is seen. No bony abnormality is noted. IMPRESSION: Increased vascular congestion without focal infiltrate. Electronically Signed   By: Alcide Clever M.D.   On: 09/23/2022 01:58    Pending Labs [employer] (From admission, onward)     Start     Ordered  09/23/22 0128  CBC with Differential  Once,   STAT        09/23/22 0127            Vitals/Pain Today's Vitals   09/23/22 0505 09/23/22 0515 09/23/22 0517 09/23/22 0519  BP:  (!) 220/118 (!) 208/108 (!) 231/107  Pulse:  92 96 93  Resp:  (!) 21 15 (!) 27  Temp: 98.7 F (37.1 C)     TempSrc: Oral     SpO2:  100% 100% 100%  Weight:      Height:        Isolation Precautions No active isolations  Medications Medications  heparin injection 5,000 Units (has no administration in time range)  acetaminophen (TYLENOL) tablet 650 mg (has no administration in time range)    Or  acetaminophen (TYLENOL) suppository 650 mg (has no administration in time range)  aspirin chewable tablet 324 mg (324 mg Oral Given 09/23/22 0342)  nitroGLYCERIN (NITROSTAT) SL tablet 0.4 mg (0.4 mg Sublingual Given 09/23/22 0203)  cloNIDine (CATAPRES) tablet 0.1 mg (0.1 mg Oral Given 09/23/22 0203)  iohexol (OMNIPAQUE) 350 MG/ML injection 100 mL (100 mLs Intravenous Contrast Given 09/23/22 0219)  amLODipine (NORVASC) tablet 10 mg (10 mg Oral Given 09/23/22 0320)  hydrALAZINE (APRESOLINE) injection 10 mg (10 mg Intravenous Given 09/23/22 0319)  sodium zirconium cyclosilicate (LOKELMA) packet 10 g (10 g Oral Given 09/23/22 0332)  calcium gluconate 1 g/ 50 mL sodium chloride IVPB (0 mg Intravenous Stopped 09/23/22 0500)  hydrALAZINE (APRESOLINE) injection 5 mg (5 mg Intravenous Given 09/23/22 0508)  labetalol (NORMODYNE) injection 20 mg (20 mg Intravenous Given  09/23/22 0516)  acetaminophen (TYLENOL) tablet 1,000 mg (1,000 mg Oral Given 09/23/22 1610)    Mobility walks     Focused Assessments Renal Assessment Handoff:  Hemodialysis Schedule: Hemodialysis Schedule: Tuesday/Thursday/Saturday Last Hemodialysis date and time: Saturday 5/11   Restricted appendage: right arm   R Recommendations: See Admitting Provider Note  Report given to:   Additional Notes:

## 2022-09-23 NOTE — Progress Notes (Addendum)
Contacted by nephrologist with request that pt receive an out-pt HD treatment tomorrow if pt's clinic can accommodate. Contacted [facility] and had to leave a message requesting a return call since the staff I need to discuss this with are at lunch. Will await a return call from clinic staff. Update provided to nephrologist.   Olivia Canter Renal Navigator [phone]  Addendum at 3:33 pm: Spoke to [place] at [facility]. Clinic is unable to treat pt tomorrow due to staffing. If a pt cancels appt tomorrow, then clinic staff will call pt to make him aware of appt time that is available. Clinic also said that if pt cannot treat tomorrow then clinic will attempt an extra treatment later this week. Update provided to nephrologist who was agreeable to the above. Navigator also spoke to pt via phone to make him aware of the above information as well.

## 2022-09-23 NOTE — ED Notes (Signed)
Patient is hard of hearing.  Will answer questions if spoken to in a loud tone and slowly.

## 2022-09-23 NOTE — Assessment & Plan Note (Addendum)
Patient's potassium was 5.7 on admission.  Was given  Lokelma 10 g and calcium gluconate in the ED.  This is in the setting of [place] syndrome.  Will likely resolve after dialysis scheduled for today. - AM CMP

## 2022-09-23 NOTE — Progress Notes (Addendum)
FMTS Interim Progress Note  S: Contacted by nursing for systolic pressure greater than 200 and headache.  Evaluated patient at bedside with attending Dr. Jennette Kettle.  Patient states his primary concern is left arm pain at IV site.  Requesting removal.  Discussed benefits of IV access, and offered to replace-patient requesting removal.  At this time patient is alert and oriented and able to take p.o. medication, okay to remove IV.  States his chest pain is burning and alleviating with burping.  Offered medication for GERD, patient declined.  Patient states he is having migraine headache, his typical.  Offered IV medication including Compazine, patient declined.  States he will only take Tylenol for headache, and that it would be better once IV was removed and he has dialysis.  Hypertension, declining all antihypertensive medication at this time.  Discussed benefit of pressure lowering medication, patient declines still.  Patient is frustrated because he has an appointment at 1 PM today for hearing aids-which he most certainly needs.  I told him we would do our best to get him out on time, however he is likely going to miss this appointment.  O: BP (!) 202/122   Pulse 96   Temp 98.9 F (37.2 C)   Resp 19   Ht 5\' 8"  (1.727 m)   Wt 78.9 kg   SpO2 95%   BMI 26.45 kg/m    General: Sitting up in bed, NAD, anxious affect Cardio: RRR Pulmonary: CTAB, normal work of breathing on room air Abdominal: Soft, nontender, distended Skin: Warm and dry  A/P: Hypertension Continue home medications.  Continue to monitor.  Anticipate this will improve with dialysis.  Headache Alert and oriented, no neurologic deficits, patient asleep upon entering room. Tylenol.  Continue to monitor.  Suggest trying Compazine if patient is agreeable.  Restart home medications at discharge.  Chest pain Suspect GERD.  Patient declines medication.  Left arm pain Arm pain at IV site.  Okay to pull IV, discussed with  nurse.  Give p.o. medications.  Continue to monitor, replace if necessary.  ESRD Dialysis today.   Spoke extensively with nursing staff about patient's chronic medical conditions.  We are aware of his high blood pressure and migraine headaches, multiple admissions with our team for similar problems.  We recognized nursing staff's discomfort with caring for this patient, and attempted a transfer to progressive floor-progressive floor declined.  Additionally, informed nursing staff the patient was declining other medical interventions at this time-so Tylenol was our only recourse for headache.  Nurse Samara Deist expressed understanding with the plan.   Overall anticipate discharge today after dialysis.  Tiffany Kocher, DO 09/23/2022, 8:53 AM PGY-1, [facility] pager [phone]

## 2022-09-23 NOTE — Progress Notes (Signed)
Asked to see patient for dialysis. He presented w/ chest pain, HA and no signs of acute MI/ angina. CXR showed vasc congestion. Saw pt in room, not in distress, no LE edema. On RA. Pt adamant he can get dc'd today to make an appt in [place] ([facility]) which is at 3 pm. We did agree to dialysis here.  We re-arranged our morning schedule to accommodate his request.  He has rec'd about 2.5 hours of dialysis and will need to come off now to make his appt. Have d/w primary team.   Pt is OBV status, if changed to full admit will do formal consult.    OP HD: [facility] 4h   2/2 bath  R AVF  Hep 1200  Rob Yvonna Brun, MD 09/23/2022, 1:30 PM  Recent Labs  Lab 09/16/22 2353 09/23/22 0128 09/23/22 0236  HGB 10.7*  --  11.4*  CALCIUM 8.8* 9.1  --   CREATININE 13.69* 12.45*  --   K 4.9 5.7*  --     Inpatient medications:  acetaminophen  650 mg Oral NOW   [START ON 09/24/2022] amLODipine  10 mg Oral Daily   Chlorhexidine Gluconate Cloth  6 each Topical Q0600   cloNIDine  0.1 mg Oral BID   divalproex  500 mg Oral QHS   heparin  5,000 Units Subcutaneous Q8H   hydrALAZINE  100 mg Oral TID   losartan  50 mg Oral Daily   pantoprazole  40 mg Oral Daily    acetaminophen **OR** acetaminophen

## 2022-09-23 NOTE — TOC Initial Note (Signed)
Transition of Care [facility]) - Initial/Assessment Note    Patient Details  Name: [name] MRN: [id number] Date of Birth: 26-Sep-1985  Transition of Care [facility]) CM/SW Contact:    Harriet Masson, RN Phone Number: 09/23/2022, 12:55 PM  Clinical Narrative:                 Spoke to patient regarding transition needs.  Patient is very hard of hearing.  Patient follows up with PCP and drives himself to apts.  Stated family can transport him home at discharge.  No toc needs at this time.  Expected Discharge Plan: Home/Self Care Barriers to Discharge: Continued Medical Work up   Patient Goals and CMS Choice Patient states their goals for this hospitalization and ongoing recovery are:: return home          Expected Discharge Plan and Services       Living arrangements for the past 2 months: Apartment                                      Prior Living Arrangements/Services Living arrangements for the past 2 months: Apartment   Patient language and need for interpreter reviewed:: Yes Do you feel safe going back to the place where you live?: Yes      Need for Family Participation in Patient Care: Yes (Comment) Care giver support system in place?: Yes (comment)   Criminal Activity/Legal Involvement Pertinent to Current Situation/Hospitalization: No - Comment as needed  Activities of Daily Living Home Assistive Devices/Equipment: None ADL Screening (condition at time of admission) Patient's cognitive ability adequate to safely complete daily activities?: Yes Is the patient deaf or have difficulty hearing?: Yes Does the patient have difficulty seeing, even when wearing glasses/contacts?: No Does the patient have difficulty concentrating, remembering, or making decisions?: No Patient able to express need for assistance with ADLs?: Yes Does the patient have difficulty dressing or bathing?: No Independently performs ADLs?: Yes (appropriate for developmental age) Does  the patient have difficulty walking or climbing stairs?: No Weakness of Legs: None Weakness of Arms/Hands: None  Permission Sought/Granted                  Emotional Assessment   Attitude/Demeanor/Rapport: Gracious Affect (typically observed): Accepting Orientation: : Oriented to Self, Oriented to Place, Oriented to  Time, Oriented to Situation Alcohol / Substance Use: Not Applicable Psych Involvement: No (comment)  Admission diagnosis:  Acute blood loss anemia [D62] Atypical chest pain [R07.89] Volume overload [E87.70] Hypertensive urgency [I16.0] Hypervolemia, unspecified hypervolemia type [E87.70] Patient Active Problem List   Diagnosis Date Noted   Acute blood loss anemia 09/23/2022   Headache 09/23/2022   Volume overload 09/23/2022   ESRD (end stage renal disease) on dialysis (HCC) 09/23/2022   Atypical chest pain 09/23/2022   Gastritis and gastroduodenitis 06/06/2022   Migraines 06/05/2022   Pancreatitis 06/03/2022   Epigastric abdominal pain 06/03/2022   Abdominal pain 06/03/2022   Rectal bleeding 04/14/2022   Hypertensive urgency 04/13/2022   Acute lower GI bleeding 04/12/2022   Heart failure with mildly reduced ejection fraction (HFmrEF) (HCC) 04/08/2022   Bilateral shoulder pain 04/08/2022   Acute otitis externa of both ears 04/08/2022   Pneumonia of right lower lobe due to infectious organism    Dyspnea 03/02/2022   Chest pain 03/02/2022   Hyperkalemia 03/02/2022   Leg pain, bilateral 01/03/2022   Bilateral calf pain  Housing insecurity 01/01/2022   Food insecurity 01/01/2022   Non-compliance with renal dialysis    Hearing loss 10/08/2021   Bloody diarrhea 06/10/2021   Family history of diabetes mellitus 02/11/2021   Hypoglycemic syndrome 02/11/2021   Rash and nonspecific skin eruption 12/16/2019   Rash on scrotum 12/16/2019   Severe uncontrolled hypertension 06/07/2018   ESRD (end stage renal disease) (HCC) 06/07/2018   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 [place] [address] Phone: [phone] Fax: [phone]  [id number] - [place], [place] - [address] AT [facility] [address] [place] [address] Phone: [phone] Fax: [phone]  [facility] [address] [place] [address] Phone: [phone] Fax: [phone]     Social Determinants of Health (SDOH) Social History: SDOH Screenings   Food Insecurity: No Food Insecurity (09/23/2022)  Housing: Low Risk  (09/23/2022)  Transportation Needs: No Transportation Needs (09/23/2022)  Utilities: Not At Risk (09/23/2022)  Depression (PHQ2-9): Low Risk  (09/22/2022)  Tobacco Use: High Risk (09/23/2022)   SDOH Interventions:     Readmission Risk Interventions     No data to display

## 2022-09-24 ENCOUNTER — Telehealth: Payer: Self-pay

## 2022-09-24 LAB — HEPATITIS B SURFACE ANTIBODY, QUANTITATIVE: Hep B S AB Quant (Post): 313 m[IU]/mL (ref >9.9)

## 2022-09-24 NOTE — Telephone Encounter (Signed)
Transition Care Management Unsuccessful Follow-up Telephone Call  Date of discharge and from where:  [facility]  Attempts:  1st Attempt  Reason for unsuccessful TCM follow-up call:  Left voice message   Lenard Forth [facility] Guide, [facility] [phone] [address] [place], [place], [place] [address] Phone: [phone] Email: [email or web address]

## 2022-09-25 ENCOUNTER — Encounter (INDEPENDENT_AMBULATORY_CARE_PROVIDER_SITE_OTHER): Payer: Medicare Other | Admitting: Family Medicine

## 2022-09-25 NOTE — Progress Notes (Deleted)
    SUBJECTIVE:   CHIEF COMPLAINT / HPI: hospital follow-up  Cardiology follow-up outpatient -   HTN - Amlodipine, Losartan, Clonidine, Coreg?  Migraine treatment?  Transplant appointment June   PERTINENT  PMH / PSH: ESRD on HD TTS, Alport syndrome, HTN, HFmrEF   OBJECTIVE:   There were no vitals taken for this visit.  ***  ASSESSMENT/PLAN:   There are no diagnoses linked to this encounter. No follow-ups on file.  Celine Mans, MD [facility]

## 2022-09-26 NOTE — Progress Notes (Signed)
error 

## 2022-09-29 ENCOUNTER — Telehealth: Payer: Self-pay

## 2022-09-29 NOTE — Telephone Encounter (Signed)
Transition Care Management Unsuccessful Follow-up Telephone Call  Date of discharge and from where:  [facility]   Attempts:  2nd Attempt  Reason for unsuccessful TCM follow-up call:  Left voice message   Lenard Forth [facility] [place] Guide, [facility] [phone] [address] [place], [place], [place] [address] Phone: [phone] Email: [email or web address]

## 2022-10-01 ENCOUNTER — Ambulatory Visit: Payer: Self-pay | Admitting: Family Medicine

## 2022-10-09 ENCOUNTER — Other Ambulatory Visit: Payer: Self-pay | Admitting: *Deleted

## 2022-10-09 DIAGNOSIS — I770 Arteriovenous fistula, acquired: Secondary | ICD-10-CM

## 2022-10-10 ENCOUNTER — Ambulatory Visit (INDEPENDENT_AMBULATORY_CARE_PROVIDER_SITE_OTHER): Payer: Medicare Other | Admitting: Physician Assistant

## 2022-10-10 ENCOUNTER — Ambulatory Visit (HOSPITAL_COMMUNITY)
Admission: RE | Admit: 2022-10-10 | Discharge: 2022-10-10 | Disposition: A | Discharge status: Home / Self Care | Payer: Medicare Other | Source: Ambulatory Visit | Attending: Vascular Surgery | Admitting: Vascular Surgery

## 2022-10-10 VITALS — BP 192/110 | HR 85 | Temp 98.6°F | Resp 16 | Ht 68.0 in | Wt 168.0 lb

## 2022-10-10 DIAGNOSIS — T82898A Other specified complication of vascular prosthetic devices, implants and grafts, initial encounter: Secondary | ICD-10-CM

## 2022-10-10 DIAGNOSIS — I770 Arteriovenous fistula, acquired: Secondary | ICD-10-CM | POA: Insufficient documentation

## 2022-10-10 DIAGNOSIS — N186 End stage renal disease: Secondary | ICD-10-CM

## 2022-10-10 DIAGNOSIS — Z992 Dependence on renal dialysis: Secondary | ICD-10-CM

## 2022-10-10 NOTE — Progress Notes (Unsigned)
Office Note   History of Present Illness   [name] is a 37 y.o. (03-25-1986) male who presents for follow up for dialysis access.  Current Outpatient Medications  Medication Sig Dispense Refill   acetaminophen (TYLENOL) 325 MG tablet Take 2 tablets (650 mg total) by mouth every 6 (six) hours as needed for mild pain (or Fever >/= 101).     amLODipine (NORVASC) 10 MG tablet TAKE 1 TABLET BY MOUTH EVERY DAY 90 tablet 3   calcitRIOL (ROCALTROL) 0.25 MCG capsule Take 0.25 mcg by mouth daily. (Patient not taking: Reported on 09/23/2022)     calcium acetate (PHOSLO) 667 MG capsule Take 1 capsule (667 mg total) by mouth 3 (three) times daily with meals. (Patient not taking: Reported on 10/10/2022) 100 capsule 0   cloNIDine (CATAPRES) 0.1 MG tablet Take 1 tablet (0.1 mg total) by mouth 2 (two) times daily. 180 tablet 3   divalproex (DEPAKOTE ER) 500 MG 24 hr tablet Take 1 tablet (500 mg total) by mouth at bedtime. 30 tablet 2   hydrALAZINE (APRESOLINE) 100 MG tablet Take 1 tablet (100 mg total) by mouth 3 (three) times daily. 270 tablet 1   losartan (COZAAR) 50 MG tablet Take 1 tablet (50 mg total) by mouth daily. 30 tablet 0   pantoprazole (PROTONIX) 40 MG tablet TAKE 1 TABLET BY MOUTH EVERY DAY 90 tablet 1   SUMAtriptan (IMITREX) 50 MG tablet Take 1 tablet (50 mg total) by mouth every 2 (two) hours as needed for migraine. Do not take more than 4 tablets in 24 hours. 20 tablet 0   triamcinolone ointment (KENALOG) 0.1 % Apply to scrotum twice daily for itching. Do not use for more than 2 weeks continuously 30 g 1   No current facility-administered medications for this visit.    ***REVIEW OF SYSTEMS (negative unless checked):   Cardiac:  []  Chest pain or chest pressure? []  Shortness of breath upon activity? []  Shortness of breath when lying flat? []  Irregular heart rhythm?  Vascular:  []  Pain in calf, thigh, or hip brought on by walking? []  Pain in feet at night that wakes you up  from your sleep? []  Blood clot in your veins? []  Leg swelling?  Pulmonary:  []  Oxygen at home? []  Productive cough? []  Wheezing?  Neurologic:  []  Sudden weakness in arms or legs? []  Sudden numbness in arms or legs? []  Sudden onset of difficult speaking or slurred speech? []  Temporary loss of vision in one eye? []  Problems with dizziness?  Gastrointestinal:  []  Blood in stool? []  Vomited blood?  Genitourinary:  []  Burning when urinating? []  Blood in urine?  Psychiatric:  []  Major depression  Hematologic:  []  Bleeding problems? []  Problems with blood clotting?  Dermatologic:  []  Rashes or ulcers?  Constitutional:  []  Fever or chills?  Ear/Nose/Throat:  []  Change in hearing? []  Nose bleeds? []  Sore throat?  Musculoskeletal:  []  Back pain? []  Joint pain? []  Muscle pain?   Physical Examination   Vitals:   10/10/22 1351  BP: (!) 192/110  Pulse: 85  Resp: 16  Temp: 98.6 F (37 C)  TempSrc: Temporal  SpO2: 98%  Weight: 168 lb (76.2 kg)  Height: 5\' 8"  (1.727 m)   Body mass index is 25.54 kg/m.  General:  WDWN in NAD; vital signs documented above Gait: Not observed HENT: WNL, normocephalic Pulmonary: normal non-labored breathing , without rales, rhonchi,  wheezing Cardiac: {Desc; regular/irreg:14544} HR, without murmurs {With/Without:20273} carotid bruit*** Abdomen:  soft, NT, no masses Skin: {With/Without:20273} rashes Vascular Exam/Pulses: *** radial and brachial pulses bilaterally Extremities: {With/Without:20273} ischemic changes, {With/Without:20273} gangrene , {With/Without:20273} cellulitis; {With/Without:20273} open wounds;  Musculoskeletal: no muscle wasting or atrophy  Neurologic: A&O X 3;  No focal weakness or paresthesias are detected Psychiatric:  The pt has {Desc; normal/abnormal:11317::"Normal"} affect.   Non-invasive Vascular Imaging   Right Arm Access Duplex  (10/10/2022):   +--------------------+----------+-----------------+--------+  AVF                PSV (cm/s)Flow Vol (mL/min)Comments  +--------------------+----------+-----------------+--------+  Native artery inflow   187          1613                 +--------------------+----------+-----------------+--------+  AVF Anastomosis        261                               +--------------------+----------+-----------------+--------+     +------------+----------+-------------+----------+--------+  OUTFLOW VEINPSV (cm/s)Diameter (cm)Depth (cm)Describe  +------------+----------+-------------+----------+--------+  Prox UA        139        0.92        0.29             +------------+----------+-------------+----------+--------+  Mid UA          40        3.38        0.42             +------------+----------+-------------+----------+--------+  Dist UA         89        3.94        0.43             +------------+----------+-------------+----------+--------+  AC Fossa        88        1.94        0.25                 Medical Decision Making   [name] is a 37 y.o. male who presents with {KidneyDisease:19197::"ESRD","chronic kidney disease stage ***"} for dialysis access follow up  No catheter. Currenty using right brachiocephalic fistula at dialysis on Tuesdays, Thursdays, and Saturdays.  Patient states that her dialysis center is having a difficult time cannulating the fistula due to high pressure readings.  At some dialysis sessions they have to shorten his session.  He also has prolonged bleeding of the fistula after cannulation. Will schedule for right brachiocephalic fistulogram with possible intervention with Dr. Edilia Bo in the next 1 to 2 weeks.  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  This will be done on a Monday, Wednesday, or Friday.  The patient is aware he may also require extensive plication of the fistula after fistulogram to reduce the aneurysmal areas.   There is no ulceration present, but the skin is difficult to mobilize over large aneurysmal areas.  The patient is aware if he requires plication he would need a TDC during his healing time.  He would like to do all procedures necessary to save this fistula. Not on anticoagulation   Loel Dubonnet PA-C [facility] [place] Office: [phone]  Clinic MD: ***

## 2022-10-10 NOTE — H&P (View-Only) (Signed)
  Office Note   History of Present Illness   [name] is a 36 y.o. (08/17/1985) male who presents for follow up for dialysis access. He has a history of left brachiocephalic fistula creation in October 2020 with subsequent ligation 3 days later due to steal syndrome. He then had a right brachiocephalic fistula created on 04/12/2019.   He returns today for evaluation of his fistula. Since we have last seen the patient, he has started dialysis. He dialyzes through his right arm AV fistula. He states his fistula has worked well over the past few years until the past few weeks. 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 Recently his dialysis center has had a difficult time cannulating his fistula and has had to shorten his dialysis sessions. He also has had prolonged bleeding times after cannulation. He denies any arm pain or ulcerations.  He dialyzes on Tuesdays, Thursdays, and Saturdays.   Current Outpatient Medications  Medication Sig Dispense Refill   acetaminophen (TYLENOL) 325 MG tablet Take 2 tablets (650 mg total) by mouth every 6 (six) hours as needed for mild pain (or Fever >/= 101).     amLODipine (NORVASC) 10 MG tablet TAKE 1 TABLET BY MOUTH EVERY DAY 90 tablet 3   calcitRIOL (ROCALTROL) 0.25 MCG capsule Take 0.25 mcg by mouth daily. (Patient not taking: Reported on 09/23/2022)     calcium acetate (PHOSLO) 667 MG capsule Take 1 capsule (667 mg total) by mouth 3 (three) times daily with meals. (Patient not taking: Reported on 10/10/2022) 100 capsule 0   cloNIDine (CATAPRES) 0.1 MG tablet Take 1 tablet (0.1 mg total) by mouth 2 (two) times daily. 180 tablet 3   divalproex (DEPAKOTE ER) 500 MG 24 hr tablet Take 1 tablet (500 mg total) by mouth at bedtime. 30 tablet 2   hydrALAZINE (APRESOLINE) 100 MG tablet Take 1 tablet (100 mg total) by mouth 3 (three) times daily. 270 tablet 1   losartan (COZAAR) 50 MG tablet Take 1 tablet (50 mg total) by mouth daily. 30 tablet 0    pantoprazole (PROTONIX) 40 MG tablet TAKE 1 TABLET BY MOUTH EVERY DAY 90 tablet 1   SUMAtriptan (IMITREX) 50 MG tablet Take 1 tablet (50 mg total) by mouth every 2 (two) hours as needed for migraine. Do not take more than 4 tablets in 24 hours. 20 tablet 0   triamcinolone ointment (KENALOG) 0.1 % Apply to scrotum twice daily for itching. Do not use for more than 2 weeks continuously 30 g 1   No current facility-administered medications for this visit.    REVIEW OF SYSTEMS (negative unless checked):   Cardiac:  [] Chest pain or chest pressure? [] Shortness of breath upon activity? [] Shortness of breath when lying flat? [] Irregular heart rhythm?  Vascular:  [] Pain in calf, thigh, or hip brought on by walking? [] Pain in feet at night that wakes you up from your sleep? [] Blood clot in your veins? [] Leg swelling?  Pulmonary:  [] Oxygen at home? [] Productive cough? [] Wheezing?  Neurologic:  [] Sudden weakness in arms or legs? [] Sudden numbness in arms or legs? [] Sudden onset of difficult speaking or slurred speech? [] Temporary loss of vision in one eye? [] Problems with dizziness?  Gastrointestinal:  [] Blood in stool? [] Vomited blood?  Genitourinary:  [] Burning when urinating? [] Blood in urine?  Psychiatric:  [] Major depression  Hematologic:  [] Bleeding problems? [] Problems with   blood clotting?  Dermatologic:  [] Rashes or ulcers?  Constitutional:  [] Fever or chills?  Ear/Nose/Throat:  [] Change in hearing? [] Nose bleeds? [] Sore throat?  Musculoskeletal:  [] Back pain? [] Joint pain? [] Muscle pain?   Physical Examination   Vitals:   10/10/22 1351  BP: (!) 192/110  Pulse: 85  Resp: 16  Temp: 98.6 F (37 C)  TempSrc: Temporal  SpO2: 98%  Weight: 168 lb (76.2 kg)  Height: 5' 8" (1.727 m)   Body mass index is 25.54 kg/m.  General:  WDWN in NAD; vital signs documented above Gait: Not observed HENT: WNL,  normocephalic Pulmonary: normal non-labored breathing , without rales, rhonchi,  wheezing Cardiac: regular HR, without murmurs without carotid bruit Abdomen: soft, NT, no masses Skin: without rashes Vascular Exam/Pulses: palpable radial and brachial pulses bilaterally Extremities: right brachiocephalic fistula with palpable thrill throughout the arm and slight pulsatility. 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 There are two visible areas of stenosis Musculoskeletal: no muscle wasting or atrophy  Neurologic: A&O X 3;  No focal weakness or paresthesias are detected Psychiatric:  The pt has Normal affect.     Non-invasive Vascular Imaging   Right Arm Access Duplex  (10/10/2022):  +--------------------+----------+-----------------+--------+  AVF                PSV (cm/s)Flow Vol (mL/min)Comments  +--------------------+----------+-----------------+--------+  Native artery inflow   187          1613                 +--------------------+----------+-----------------+--------+  AVF Anastomosis        261                               +--------------------+----------+-----------------+--------+     +------------+----------+-------------+----------+--------+  OUTFLOW VEINPSV (cm/s)Diameter (cm)Depth (cm)Describe  +------------+----------+-------------+----------+--------+  Prox UA        139        0.92        0.29             +------------+----------+-------------+----------+--------+  Mid UA          40        3.38        0.42             +------------+----------+-------------+----------+--------+  Dist UA         89        3.94        0.43             +------------+----------+-------------+----------+--------+  AC Fossa        88        1.94        0.25                 Medical Decision Making   [name] is a 36 y.o. male who presents for fistula evaluation  The patient has a  right brachiocephalic fistula created in 2020. This has been successfully used at dialysis over the years up until the past few weeks. His fistula has a great thrill on exam but is quite aneurysmal and has 2 visible areas of stenosis His dialysis center is able to use his fistula however he is having prolonged bleeding times after cannulation. The skin overlying his aneurysms also cannot be easily mobilized making it at risk for ulceration   and rupture The patient likely has outflow stenosis contributing to his aneurysm growth. He will be scheduled for right brachiocephalic fistulogram with possible intervention with Dr. Dickson in the next 1 to 2 weeks.  Afterwards he would likely require extensive plication and revision of his fistula to reduce his aneurysms.  The patient is aware if he requires plication he would need a TDC in the interim.  He would like to do all procedures necessary to save his fistula. His fistulogram can be done on a nondialysis day   Kafi Dotter PA-C [facility] [place] Office: [phone]  Clinic MD: [place] 

## 2022-10-10 NOTE — H&P (View-Only) (Signed)
Office Note   History of Present Illness   [name] is a 37 y.o. (1986/02/13) male who presents for follow up for dialysis access. He has a history of left brachiocephalic fistula creation in October 2020 with subsequent ligation 3 days later due to steal syndrome. He then had a right brachiocephalic fistula created on 04/12/2019.   He returns today for evaluation of his fistula. Since we have last seen the patient, he has started dialysis. He dialyzes through his right arm AV fistula. He states his fistula has worked well over the past few years until the past few weeks. 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 Recently his dialysis center has had a difficult time cannulating his fistula and has had to shorten his dialysis sessions. He also has had prolonged bleeding times after cannulation. He denies any arm pain or ulcerations.  He dialyzes on Tuesdays, Thursdays, and Saturdays.   Current Outpatient Medications  Medication Sig Dispense Refill   acetaminophen (TYLENOL) 325 MG tablet Take 2 tablets (650 mg total) by mouth every 6 (six) hours as needed for mild pain (or Fever >/= 101).     amLODipine (NORVASC) 10 MG tablet TAKE 1 TABLET BY MOUTH EVERY DAY 90 tablet 3   calcitRIOL (ROCALTROL) 0.25 MCG capsule Take 0.25 mcg by mouth daily. (Patient not taking: Reported on 09/23/2022)     calcium acetate (PHOSLO) 667 MG capsule Take 1 capsule (667 mg total) by mouth 3 (three) times daily with meals. (Patient not taking: Reported on 10/10/2022) 100 capsule 0   cloNIDine (CATAPRES) 0.1 MG tablet Take 1 tablet (0.1 mg total) by mouth 2 (two) times daily. 180 tablet 3   divalproex (DEPAKOTE ER) 500 MG 24 hr tablet Take 1 tablet (500 mg total) by mouth at bedtime. 30 tablet 2   hydrALAZINE (APRESOLINE) 100 MG tablet Take 1 tablet (100 mg total) by mouth 3 (three) times daily. 270 tablet 1   losartan (COZAAR) 50 MG tablet Take 1 tablet (50 mg total) by mouth daily. 30 tablet 0    pantoprazole (PROTONIX) 40 MG tablet TAKE 1 TABLET BY MOUTH EVERY DAY 90 tablet 1   SUMAtriptan (IMITREX) 50 MG tablet Take 1 tablet (50 mg total) by mouth every 2 (two) hours as needed for migraine. Do not take more than 4 tablets in 24 hours. 20 tablet 0   triamcinolone ointment (KENALOG) 0.1 % Apply to scrotum twice daily for itching. Do not use for more than 2 weeks continuously 30 g 1   No current facility-administered medications for this visit.    REVIEW OF SYSTEMS (negative unless checked):   Cardiac:  []  Chest pain or chest pressure? []  Shortness of breath upon activity? []  Shortness of breath when lying flat? []  Irregular heart rhythm?  Vascular:  []  Pain in calf, thigh, or hip brought on by walking? []  Pain in feet at night that wakes you up from your sleep? []  Blood clot in your veins? []  Leg swelling?  Pulmonary:  []  Oxygen at home? []  Productive cough? []  Wheezing?  Neurologic:  []  Sudden weakness in arms or legs? []  Sudden numbness in arms or legs? []  Sudden onset of difficult speaking or slurred speech? []  Temporary loss of vision in one eye? []  Problems with dizziness?  Gastrointestinal:  []  Blood in stool? []  Vomited blood?  Genitourinary:  []  Burning when urinating? []  Blood in urine?  Psychiatric:  []  Major depression  Hematologic:  []  Bleeding problems? []  Problems with  blood clotting?  Dermatologic:  []  Rashes or ulcers?  Constitutional:  []  Fever or chills?  Ear/Nose/Throat:  []  Change in hearing? []  Nose bleeds? []  Sore throat?  Musculoskeletal:  []  Back pain? []  Joint pain? []  Muscle pain?   Physical Examination   Vitals:   10/10/22 1351  BP: (!) 192/110  Pulse: 85  Resp: 16  Temp: 98.6 F (37 C)  TempSrc: Temporal  SpO2: 98%  Weight: 168 lb (76.2 kg)  Height: 5\' 8"  (1.727 m)   Body mass index is 25.54 kg/m.  General:  WDWN in NAD; vital signs documented above Gait: Not observed HENT: WNL,  normocephalic Pulmonary: normal non-labored breathing , without rales, rhonchi,  wheezing Cardiac: regular HR, without murmurs without carotid bruit Abdomen: soft, NT, no masses Skin: without rashes Vascular Exam/Pulses: palpable radial and brachial pulses bilaterally Extremities: right brachiocephalic fistula with palpable thrill throughout the arm and slight pulsatility. 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 There are two visible areas of stenosis Musculoskeletal: no muscle wasting or atrophy  Neurologic: A&O X 3;  No focal weakness or paresthesias are detected Psychiatric:  The pt has Normal affect.     Non-invasive Vascular Imaging   Right Arm Access Duplex  (10/10/2022):  +--------------------+----------+-----------------+--------+  AVF                PSV (cm/s)Flow Vol (mL/min)Comments  +--------------------+----------+-----------------+--------+  Native artery inflow   187          1613                 +--------------------+----------+-----------------+--------+  AVF Anastomosis        261                               +--------------------+----------+-----------------+--------+     +------------+----------+-------------+----------+--------+  OUTFLOW VEINPSV (cm/s)Diameter (cm)Depth (cm)Describe  +------------+----------+-------------+----------+--------+  Prox UA        139        0.92        0.29             +------------+----------+-------------+----------+--------+  Mid UA          40        3.38        0.42             +------------+----------+-------------+----------+--------+  Dist UA         89        3.94        0.43             +------------+----------+-------------+----------+--------+  AC Fossa        88        1.94        0.25                 Medical Decision Making   DERROL SHARMAN is a 37 y.o. male who presents for fistula evaluation  The patient has a  right brachiocephalic fistula created in 2020. This has been successfully used at dialysis over the years up until the past few weeks. His fistula has a great thrill on exam but is quite aneurysmal and has 2 visible areas of stenosis His dialysis center is able to use his fistula however he is having prolonged bleeding times after cannulation. The skin overlying his aneurysms also cannot be easily mobilized making it at risk for ulceration  and rupture The patient likely has outflow stenosis contributing to his aneurysm growth. He will be scheduled for right brachiocephalic fistulogram with possible intervention with Dr. Edilia Bo in the next 1 to 2 weeks.  Afterwards he would likely require extensive plication and revision of his fistula to reduce his aneurysms.  The patient is aware if he requires plication he would need a TDC in the interim.  He would like to do all procedures necessary to save his fistula. His fistulogram can be done on a nondialysis day   Loel Dubonnet PA-C [facility] Office: [phone]  Clinic MD: Karin Lieu

## 2022-10-14 ENCOUNTER — Ambulatory Visit: Payer: Medicare Other | Admitting: Family Medicine

## 2022-10-14 NOTE — Progress Notes (Deleted)
  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 Gen: *** HEENT: *** Heart: *** Lungs: *** Neuro: *** Ext: ***  ASSESSMENT/PLAN:   Health maintenance:  -***  No problem-specific Assessment & Plan notes found for this encounter.  FOLLOW UP: Follow up in *** for ***  Kayleen Memos, Medical Student [facility]

## 2022-10-14 NOTE — Progress Notes (Unsigned)
    SUBJECTIVE:   CHIEF COMPLAINT / HPI:   *** Follow-up recommendations Ensure patient compliance with blood pressure medications Revisit migraine treatment, ensure compliance with medication May benefit from outpatient stress test to evaluate intermittent chest pain May need assistance with rescheduling transplant clinic appointment  PERTINENT  PMH / PSH: ***  OBJECTIVE:   There were no vitals taken for this visit.  ***  ASSESSMENT/PLAN:   No problem-specific Assessment & Plan notes found for this encounter.     Fayette Pho, MD [facility]

## 2022-10-15 ENCOUNTER — Ambulatory Visit: Payer: Medicare Other

## 2022-10-16 ENCOUNTER — Other Ambulatory Visit: Payer: Self-pay

## 2022-10-16 ENCOUNTER — Telehealth: Payer: Self-pay | Admitting: Family Medicine

## 2022-10-16 DIAGNOSIS — T82898A Other specified complication of vascular prosthetic devices, implants and grafts, initial encounter: Secondary | ICD-10-CM

## 2022-10-16 DIAGNOSIS — Z91199 Patient's noncompliance with other medical treatment and regimen due to unspecified reason: Secondary | ICD-10-CM

## 2022-10-16 DIAGNOSIS — Z992 Dependence on renal dialysis: Secondary | ICD-10-CM

## 2022-10-16 MED ORDER — SODIUM CHLORIDE 0.9 % IV SOLN: 250.0000 mL | INTRAVENOUS | Status: DC | PRN

## 2022-10-16 MED ORDER — SODIUM CHLORIDE 0.9% FLUSH: 3.0000 mL | Freq: Two times a day (BID) | INTRAVENOUS | Status: DC

## 2022-10-16 NOTE — Addendum Note (Signed)
Addended by: Leilani Able, Shanetha Bradham A on: 10/16/2022 11:37 AM   Modules accepted: Orders

## 2022-10-16 NOTE — Telephone Encounter (Signed)
No show Wed 6/05 for acces to care appointment for complaint of urinary issue.   Previous no shows in last calendar year below:  10/01/22 09/02/22 09/01/22  Will forward to PCP and no show pool for monitoring.   Fayette Pho, MD

## 2022-10-26 ENCOUNTER — Emergency Department (HOSPITAL_COMMUNITY)
Admission: EM | Admit: 2022-10-26 | Discharge: 2022-10-27 | Disposition: A | Discharge status: Home / Self Care | Payer: Medicare Other | Source: Home / Self Care | Attending: Emergency Medicine | Admitting: Emergency Medicine

## 2022-10-26 DIAGNOSIS — N186 End stage renal disease: Secondary | ICD-10-CM | POA: Insufficient documentation

## 2022-10-26 DIAGNOSIS — Y841 Kidney dialysis as the cause of abnormal reaction of the patient, or of later complication, without mention of misadventure at the time of the procedure: Secondary | ICD-10-CM | POA: Diagnosis not present

## 2022-10-26 DIAGNOSIS — T829XXA Unspecified complication of cardiac and vascular prosthetic device, implant and graft, initial encounter: Secondary | ICD-10-CM

## 2022-10-26 DIAGNOSIS — T82898A Other specified complication of vascular prosthetic devices, implants and grafts, initial encounter: Secondary | ICD-10-CM | POA: Diagnosis present

## 2022-10-26 DIAGNOSIS — T82590A Other mechanical complication of surgically created arteriovenous fistula, initial encounter: Secondary | ICD-10-CM | POA: Insufficient documentation

## 2022-10-26 DIAGNOSIS — Z992 Dependence on renal dialysis: Secondary | ICD-10-CM | POA: Diagnosis not present

## 2022-10-26 NOTE — ED Notes (Signed)
Called pt for triage x3 with no response.

## 2022-10-26 NOTE — ED Notes (Signed)
Pt heard of hearing. Speak loud! Does wear hearing aids.

## 2022-10-27 ENCOUNTER — Encounter (HOSPITAL_COMMUNITY)
Admission: RE | Disposition: A | Discharge status: Home / Self Care | Payer: Self-pay | Source: Home / Self Care | Attending: Vascular Surgery

## 2022-10-27 ENCOUNTER — Other Ambulatory Visit: Payer: Self-pay

## 2022-10-27 ENCOUNTER — Ambulatory Visit (HOSPITAL_COMMUNITY)
Admission: RE | Admit: 2022-10-27 | Discharge: 2022-10-27 | Disposition: A | Discharge status: Home / Self Care | Payer: Medicare Other | Attending: Vascular Surgery | Admitting: Vascular Surgery

## 2022-10-27 ENCOUNTER — Encounter (HOSPITAL_COMMUNITY): Payer: Self-pay

## 2022-10-27 DIAGNOSIS — T82898A Other specified complication of vascular prosthetic devices, implants and grafts, initial encounter: Secondary | ICD-10-CM | POA: Insufficient documentation

## 2022-10-27 DIAGNOSIS — Y841 Kidney dialysis as the cause of abnormal reaction of the patient, or of later complication, without mention of misadventure at the time of the procedure: Secondary | ICD-10-CM | POA: Insufficient documentation

## 2022-10-27 DIAGNOSIS — N185 Chronic kidney disease, stage 5: Secondary | ICD-10-CM

## 2022-10-27 DIAGNOSIS — Z992 Dependence on renal dialysis: Secondary | ICD-10-CM | POA: Insufficient documentation

## 2022-10-27 DIAGNOSIS — N186 End stage renal disease: Secondary | ICD-10-CM | POA: Insufficient documentation

## 2022-10-27 HISTORY — PX: A/V FISTULAGRAM: CATH118298

## 2022-10-27 HISTORY — PX: PERIPHERAL VASCULAR BALLOON ANGIOPLASTY: CATH118281

## 2022-10-27 LAB — CBC WITH DIFFERENTIAL/PLATELET
Abs Immature Granulocytes: 0.01 10*3/uL (ref 0.00–0.07)
Basophils Absolute: 0.1 10*3/uL (ref 0.0–0.1)
Basophils Relative: 1 %
Eosinophils Absolute: 0.2 10*3/uL (ref 0.0–0.5)
Eosinophils Relative: 3 %
HCT: 29.1 % — ABNORMAL LOW (ref 39.0–52.0)
Hemoglobin: 9.4 g/dL — ABNORMAL LOW (ref 13.0–17.0)
Immature Granulocytes: 0 %
Lymphocytes Relative: 30 %
Lymphs Abs: 1.7 10*3/uL (ref 0.7–4.0)
MCH: 31.8 pg (ref 26.0–34.0)
MCHC: 32.3 g/dL (ref 30.0–36.0)
MCV: 98.3 fL (ref 80.0–100.0)
Monocytes Absolute: 0.6 10*3/uL (ref 0.1–1.0)
Monocytes Relative: 10 %
Neutro Abs: 3.1 10*3/uL (ref 1.7–7.7)
Neutrophils Relative %: 56 %
Platelets: 232 10*3/uL (ref 150–400)
RBC: 2.96 MIL/uL — ABNORMAL LOW (ref 4.22–5.81)
RDW: 15.4 % (ref 11.5–15.5)
WBC: 5.6 10*3/uL (ref 4.0–10.5)
nRBC: 0 % (ref 0.0–0.2)

## 2022-10-27 LAB — COMPREHENSIVE METABOLIC PANEL
ALT: 27 U/L (ref 0–44)
AST: 27 U/L (ref 15–41)
Albumin: 3.4 g/dL — ABNORMAL LOW (ref 3.5–5.0)
Alkaline Phosphatase: 99 U/L (ref 38–126)
Anion gap: 16 — ABNORMAL HIGH (ref 5–15)
BUN: 56 mg/dL — ABNORMAL HIGH (ref 6–20)
CO2: 26 mmol/L (ref 22–32)
Calcium: 9.5 mg/dL (ref 8.9–10.3)
Chloride: 92 mmol/L — ABNORMAL LOW (ref 98–111)
Creatinine, Ser: 10.26 mg/dL — ABNORMAL HIGH (ref 0.61–1.24)
GFR, Estimated: 6 mL/min — ABNORMAL LOW (ref >60)
Glucose, Bld: 109 mg/dL — ABNORMAL HIGH (ref 70–99)
Potassium: 4.6 mmol/L (ref 3.5–5.1)
Sodium: 134 mmol/L — ABNORMAL LOW (ref 135–145)
Total Bilirubin: 0.5 mg/dL (ref 0.3–1.2)
Total Protein: 6.3 g/dL — ABNORMAL LOW (ref 6.5–8.1)

## 2022-10-27 LAB — POCT I-STAT, CHEM 8
BUN: 74 mg/dL — ABNORMAL HIGH (ref 6–20)
Calcium, Ion: 1.13 mmol/L — ABNORMAL LOW (ref 1.15–1.40)
Chloride: 96 mmol/L — ABNORMAL LOW (ref 98–111)
Creatinine, Ser: 11.3 mg/dL — ABNORMAL HIGH (ref 0.61–1.24)
Glucose, Bld: 83 mg/dL (ref 70–99)
HCT: 30 % — ABNORMAL LOW (ref 39.0–52.0)
Hemoglobin: 10.2 g/dL — ABNORMAL LOW (ref 13.0–17.0)
Potassium: 5.1 mmol/L (ref 3.5–5.1)
Sodium: 135 mmol/L (ref 135–145)
TCO2: 30 mmol/L (ref 22–32)

## 2022-10-27 SURGERY — A/V FISTULAGRAM
Anesthesia: LOCAL | Laterality: Right

## 2022-10-27 MED ORDER — SODIUM CHLORIDE 0.9% FLUSH: 3.0000 mL | INTRAVENOUS | Status: DC | PRN

## 2022-10-27 MED ORDER — MIDAZOLAM HCL 2 MG/2ML IJ SOLN
INTRAMUSCULAR | Status: AC
  Filled 2022-10-27: qty 2

## 2022-10-27 MED ORDER — LIDOCAINE HCL (PF) 1 % IJ SOLN
INTRAMUSCULAR | Status: AC
  Filled 2022-10-27: qty 30

## 2022-10-27 MED ORDER — HEPARIN SODIUM (PORCINE) 1000 UNIT/ML IJ SOLN
INTRAMUSCULAR | Status: DC | PRN
  Administered 2022-10-27: 2000 [IU] via INTRAVENOUS

## 2022-10-27 MED ORDER — LABETALOL HCL 5 MG/ML IV SOLN
INTRAVENOUS | Status: DC | PRN
  Administered 2022-10-27 (×2): 10 mg via INTRAVENOUS

## 2022-10-27 MED ORDER — AMLODIPINE BESYLATE 5 MG PO TABS
10.0000 mg | ORAL_TABLET | ORAL | Status: AC
  Administered 2022-10-27: 10 mg via ORAL
  Filled 2022-10-27: qty 2

## 2022-10-27 MED ORDER — HEPARIN (PORCINE) IN NACL 1000-0.9 UT/500ML-% IV SOLN
INTRAVENOUS | Status: DC | PRN
  Administered 2022-10-27: 500 mL

## 2022-10-27 MED ORDER — IODIXANOL 320 MG/ML IV SOLN
INTRAVENOUS | Status: DC | PRN
  Administered 2022-10-27: 60 mL

## 2022-10-27 MED ORDER — MIDAZOLAM HCL 2 MG/2ML IJ SOLN
INTRAMUSCULAR | Status: DC | PRN
  Administered 2022-10-27: 1 mg via INTRAVENOUS

## 2022-10-27 MED ORDER — LABETALOL HCL 5 MG/ML IV SOLN
INTRAVENOUS | Status: AC
  Filled 2022-10-27: qty 4

## 2022-10-27 MED ORDER — HEPARIN SODIUM (PORCINE) 1000 UNIT/ML IJ SOLN
INTRAMUSCULAR | Status: AC
  Filled 2022-10-27: qty 10

## 2022-10-27 MED ORDER — HYDRALAZINE HCL 50 MG PO TABS
100.0000 mg | ORAL_TABLET | ORAL | Status: AC
  Administered 2022-10-27: 100 mg via ORAL
  Filled 2022-10-27 (×2): qty 2

## 2022-10-27 MED ORDER — LIDOCAINE HCL (PF) 1 % IJ SOLN
INTRAMUSCULAR | Status: DC | PRN
  Administered 2022-10-27: 4 mL

## 2022-10-27 SURGICAL SUPPLY — 17 items
BALLN MUSTANG 8X60X75 (BALLOONS) ×2
BALLOON MUSTANG 8X60X75 (BALLOONS) IMPLANT
CATH ANGIO 5F BER2 65CM (CATHETERS) IMPLANT
CATH STRAIGHT 5FR 65CM (CATHETERS) IMPLANT
COVER DOME SNAP 22 D (MISCELLANEOUS) ×2 IMPLANT
DEVICE TORQUE .025-.038 (MISCELLANEOUS) IMPLANT
GUIDEWIRE ANGLED .035X150CM (WIRE) IMPLANT
KIT ENCORE 26 ADVANTAGE (KITS) IMPLANT
KIT MICROPUNCTURE NIT STIFF (SHEATH) IMPLANT
PROTECTION STATION PRESSURIZED (MISCELLANEOUS) ×2
SHEATH PINNACLE R/O II 6F 4CM (SHEATH) IMPLANT
SHEATH PROBE COVER 6X72 (BAG) ×2 IMPLANT
STATION PROTECTION PRESSURIZED (MISCELLANEOUS) ×2 IMPLANT
STOPCOCK MORSE 400PSI 3WAY (MISCELLANEOUS) ×2 IMPLANT
TRAY PV CATH (CUSTOM PROCEDURE TRAY) ×2 IMPLANT
TUBING CIL FLEX 10 FLL-RA (TUBING) ×2 IMPLANT
WIRE BENTSON .035X145CM (WIRE) IMPLANT

## 2022-10-27 NOTE — ED Notes (Signed)
Not name called, no response. Pt not seen in waiting room or outside.

## 2022-10-27 NOTE — ED Provider Notes (Signed)
[facility] EMERGENCY DEPARTMENT AT [facility] Provider Note   CSN: [id number] Arrival date & time: 10/26/22  2142     History  Chief Complaint  Patient presents with   fistula problem    [name] is a 37 y.o. male.  Patient presents to the emergency department because he has been bleeding from his dialysis fistula in the right upper arm.  He reports that dialysis has been having trouble doing dialysis because the pains around the needles.  It was bleeding tonight so he called his doctor and was told to come to the ER.       Home Medications Prior to Admission medications   Medication Sig Start Date End Date Taking? Authorizing Provider  acetaminophen (TYLENOL) 325 MG tablet Take 2 tablets (650 mg total) by mouth every 6 (six) hours as needed for mild pain (or Fever >/= 101). 04/14/22   Lockie Mola, MD  amLODipine (NORVASC) 10 MG tablet TAKE 1 TABLET BY MOUTH EVERY DAY 04/10/22   Sabino Dick, DO  calcitRIOL (ROCALTROL) 0.25 MCG capsule Take 0.25 mcg by mouth daily. Patient not taking: Reported on 09/23/2022    [provider]  calcium acetate (PHOSLO) 667 MG capsule Take 1 capsule (667 mg total) by mouth 3 (three) times daily with meals. Patient not taking: Reported on 10/10/2022 01/04/22   Vonna Drafts, MD  cloNIDine (CATAPRES) 0.1 MG tablet Take 1 tablet (0.1 mg total) by mouth 2 (two) times daily. 08/05/22   Corky Crafts, MD  divalproex (DEPAKOTE ER) 500 MG 24 hr tablet Take 1 tablet (500 mg total) by mouth at bedtime. 08/05/22   Sabino Dick, DO  hydrALAZINE (APRESOLINE) 100 MG tablet Take 1 tablet (100 mg total) by mouth 3 (three) times daily. 05/20/22   Gaston Islam., NP  losartan (COZAAR) 50 MG tablet Take 1 tablet (50 mg total) by mouth daily. 06/06/22   [name], DO  pantoprazole (PROTONIX) 40 MG tablet TAKE 1 TABLET BY MOUTH EVERY DAY 09/17/22   Sabino Dick, DO  SUMAtriptan (IMITREX) 50 MG tablet Take 1 tablet  (50 mg total) by mouth every 2 (two) hours as needed for migraine. Do not take more than 4 tablets in 24 hours. 08/05/22   Sabino Dick, DO  triamcinolone ointment (KENALOG) 0.1 % Apply to scrotum twice daily for itching. Do not use for more than 2 weeks continuously 05/30/22   Sabino Dick, DO      Allergies    Zestril [lisinopril], Nsaids, and Risperdal [risperidone]    Review of Systems   Review of Systems  Physical Exam Updated Vital Signs BP (!) 188/121 (BP Location: Left Arm)   Pulse 99   Temp 98.4 F (36.9 C) (Oral)   Resp 18   SpO2 98%  Physical Exam Vitals and nursing note reviewed.  Constitutional:      General: He is not in acute distress.    Appearance: He is well-developed.  HENT:     Head: Normocephalic and atraumatic.     Mouth/Throat:     Mouth: Mucous membranes are moist.  Eyes:     General: Vision grossly intact. Gaze aligned appropriately.     Extraocular Movements: Extraocular movements intact.     Conjunctiva/sclera: Conjunctivae normal.  Cardiovascular:     Rate and Rhythm: Normal rate and regular rhythm.     Pulses: Normal pulses.     Heart sounds: Normal heart sounds, S1 normal and S2 normal. No murmur heard.  No friction rub. No gallop.  Pulmonary:     Effort: Pulmonary effort is normal. No respiratory distress.     Breath sounds: Normal breath sounds.  Abdominal:     Palpations: Abdomen is soft.     Tenderness: There is no abdominal tenderness. There is no guarding or rebound.     Hernia: No hernia is present.  Musculoskeletal:        General: No swelling.     Cervical back: Full passive range of motion without pain, normal range of motion and neck supple. No pain with movement, spinous process tenderness or muscular tenderness. Normal range of motion.     Right lower leg: No edema.     Left lower leg: No edema.     Comments: Torturous fistula right upper arm, no bleeding, no overlying induration, erythema, drainage or signs of  infection.  Positive thrill palpated.  Skin:    General: Skin is warm and dry.     Capillary Refill: Capillary refill takes less than 2 seconds.     Findings: No ecchymosis, erythema, lesion or wound.  Neurological:     Mental Status: [name] and oriented to person, place, and time.     GCS: GCS eye subscore is 4. GCS verbal subscore is 5. GCS motor subscore is 6.     Cranial Nerves: Cranial nerves 2-12 are intact.     Sensory: Sensation is intact.     Motor: Motor function is intact. No weakness or abnormal muscle tone.     Coordination: Coordination is intact.  Psychiatric:        Mood and Affect: Mood normal.        Speech: Speech normal.        Behavior: Behavior normal.     ED Results / Procedures / Treatments   Labs (all labs ordered are listed, but only abnormal results are displayed) Labs Reviewed  CBC WITH DIFFERENTIAL/PLATELET - Abnormal; Notable for the following components:      Result Value   RBC 2.96 (*)    Hemoglobin 9.4 (*)    HCT 29.1 (*)    All other components within normal limits  COMPREHENSIVE METABOLIC PANEL - Abnormal; Notable for the following components:   Sodium 134 (*)    Chloride 92 (*)    Glucose, Bld 109 (*)    BUN 56 (*)    Creatinine, Ser 10.26 (*)    Total Protein 6.3 (*)    Albumin 3.4 (*)    GFR, Estimated 6 (*)    Anion gap 16 (*)    All other components within normal limits    EKG None  Radiology No results found.  Procedures Procedures    Medications Ordered in ED Medications - No data to display  ED Course/ Medical Decision Making/ A&P                             Medical Decision Making Amount and/or Complexity of Data Reviewed Labs: ordered.   Patient with intermittent bleeding from his fistula, including during dialysis.  He was having bleeding prior to coming to the ED.  At this time, however, bleeding has stopped.  Record review reveals that he is on the OR schedule for a fistulogram in the morning.  He  does not need any intervention tonight, can follow-up with vascular surgery tomorrow for procedure.        Final Clinical Impression(s) / ED Diagnoses  Final diagnoses:  Complication of arteriovenous dialysis fistula, initial encounter    Rx / DC Orders ED Discharge Orders     None         Courtland Reas, Canary Brim, MD 10/27/22 [phone]

## 2022-10-27 NOTE — ED Triage Notes (Signed)
Pt arrived from home via POV s/p right fistula bleeding that began yesterday after dialysis. Bleeding has finally stopped, but now pt has small blisters on fistula. Pt was told to come have evaluated at ED.

## 2022-10-27 NOTE — Interval H&P Note (Signed)
History and Physical Interval Note:  10/27/2022 8:58 AM  [name]  has presented today for surgery, with the diagnosis of ESRD , aneurysm of arterio venus dialysis fistula.  The various methods of treatment have been discussed with the patient and family. After consideration of risks, benefits and other options for treatment, the patient has consented to  Procedure(s): A/V Fistulagram (Right) as a surgical intervention.  The patient's history has been reviewed, patient examined, no change in status, stable for surgery.  I have reviewed the patient's chart and labs.  Questions were answered to the patient's satisfaction.     [name]

## 2022-10-27 NOTE — Op Note (Signed)
   PATIENT: [name]      MRN: [id number] DOB: [birth year]    DATE OF PROCEDURE: 10/27/2022  INDICATIONS:    [name] is a 37 y.o. male with a large aneurysmal right upper arm fistula.  He was set up for a fistulogram.  PROCEDURE:    Conscious sedation Ultrasound-guided access to the right brachiocephalic AV fistula Fistulogram right brachiocephalic AV fistula. Angioplasty of right cephalic vein Angioplasty of right's cephalic vein and subclavian vein  SURGEON: Di Kindle. Edilia Bo, MD, FACS  ANESTHESIA: Local with sedation  EBL: Minimal  TECHNIQUE: The patient was brought to the peripheral vascular lab and was sedated. The period of conscious sedation was 55 minutes.  During that time period, I was present face-to-face 100% of the time.  The patient was administered 1 mg of Versed and 50 mcg of fentanyl. The patient's heart rate, blood pressure, and oxygen saturation were monitored by the nurse continuously during the procedure.  The right arm was prepped and draped in usual sterile fashion.  Under ultrasound guidance, after the skin was anesthetized, I cannulated the fistula with a acupuncture needle and a micropuncture sheath was introduced over a wire.  Fistulogram was then obtained to evaluate the central veins in the proximal anastomosis.  There was an area of stenosis where the cephalic vein entered the central veins where it crossed the first rib.  There was also a weblike stenosis in the central portion of the fistula.  I elected to address these with balloon angioplasty.  I was able to get an angled Glidewire using a support catheter up into the superior vena cava.  I then exchanged the micropuncture sheath for a short 6 French sheath.  I selected an 8 mm x 6 cm balloon.  Patient received 2000 units of IV heparin.  I dressed the weblike stenosis in the central arm with the 8 mm balloon which was inflated to rated burst pressure.  Follow-up film showed significant  improvement.  I addressed the stenosis where the cephalic vein entered the subclavian vein with an 8 mm x 6 cm balloon.  Of note there was significant collaterals here.  Initially I addressed this with an inflation to nominal pressure but went back and inflated to 16 atm for 1 minute.  This showed significant improvement.  The fistula did have an improved thrill.  The wire and balloon were removed.  A 4-0 Monocryl was used to close the cannulation site.  FINDINGS:   Patent right brachiocephalic AV fistula Long segment stenosis of the cephalic vein where it enters the subclavian vein.  This was addressed with balloon angioplasty as described above with moderate improvement. Weblike stenosis in the central fistula.  This was addressed with balloon angioplasty as described above with moderate improvement. No stenosis noted at the proximal fistula.  CLINICAL NOTE: This patient has a large aneurysmal fistula and I explained that 1 option would be to convert this to an AV graft and place a catheter.  He feels strongly about trying to salvage the fistula.  For this reason we will schedule him for plication of the 2 largest aneurysms and placement of a tunneled dialysis catheter.  He is on Monday Wednesday Fridays dialysis so I will try to schedule this on a Friday.   Waverly Ferrari, MD, FACS [facility]  DATE OF DICTATION:   10/27/2022

## 2022-10-27 NOTE — Discharge Instructions (Signed)
Everything looks okay with your fistula right now.  Dr. Edilia Bo will look at it tomorrow when he does the fistulogram procedure.  You do not need anything else tonight.  Please make sure you take all of your medications in the morning before coming for the procedure.

## 2022-10-28 ENCOUNTER — Other Ambulatory Visit: Payer: Self-pay

## 2022-10-28 ENCOUNTER — Encounter (HOSPITAL_COMMUNITY): Payer: Self-pay | Admitting: Vascular Surgery

## 2022-10-28 DIAGNOSIS — T82898A Other specified complication of vascular prosthetic devices, implants and grafts, initial encounter: Secondary | ICD-10-CM

## 2022-10-28 DIAGNOSIS — Z992 Dependence on renal dialysis: Secondary | ICD-10-CM

## 2022-11-03 ENCOUNTER — Telehealth: Payer: Self-pay

## 2022-11-03 NOTE — Telephone Encounter (Signed)
Transition Care Management Follow-up Telephone Call Date of discharge and from where: 10/27/2022 [facility] How have you been since you were released from the hospital? Patient is still having pain in his arm and has an upcoming procedure. Any questions or concerns? No  Items Reviewed: Did the pt receive and understand the discharge instructions provided? Yes  Medications obtained and verified? Yes  Other? No  Any new allergies since your discharge? No  Dietary orders reviewed? Yes Do you have support at home? Yes   Follow up appointments reviewed:  PCP Hospital f/u appt confirmed? No  Scheduled to see  on  @ . Specialist Hospital f/u appt confirmed? Yes  Scheduled to see Di Kindle. Edilia Bo, MD on 11/07/2022 @ [facility]. Are transportation arrangements needed? No  If their condition worsens, is the pt aware to call PCP or go to the Emergency Dept.? Yes Was the patient provided with contact information for the PCP's office or ED? Yes Was to pt encouraged to call back with questions or concerns? Yes  Mishawn Hemann [facility]  [facility] Population Health Community Resource Care Guide   [email or web address]  ?? [phone]   Website: [email or web address]  [email or web address]

## 2022-11-05 ENCOUNTER — Other Ambulatory Visit: Payer: Self-pay

## 2022-11-05 ENCOUNTER — Encounter (HOSPITAL_COMMUNITY): Payer: Self-pay | Admitting: Vascular Surgery

## 2022-11-05 NOTE — Progress Notes (Signed)
SDW CALL  Patient was given pre-op instructions over the phone. The opportunity was given for the patient to ask questions. No further questions asked. Patient verbalized understanding of instructions given.   PCP - [facility] Cardiologist - Juliane Lack  PPM/ICD - denies Device Orders -  Rep Notified -   Chest x-ray - 04/12/22 EKG - 09/23/22 Stress Test - denies ECHO - 06/03/22 Cardiac Cath - none  Sleep Study - denies CPAP - no  Fasting Blood Sugar - na Checks Blood Sugar _____ times a day  Blood Thinner Instructions:na Aspirin Instructions:na  ERAS Protcol -no PRE-SURGERY Ensure or G2-   COVID TEST- na   Anesthesia review: yes- cardiac history-HTN,ESRD  Patient denies shortness of breath, fever, cough and chest pain over the phone call   Surgical Instructions    Your procedure is scheduled on Friday June 28.  Report to [facility] Main Entrance "A" at 0530 A.M., then check in with the Admitting office.  Call this number if you have problems the morning of surgery:  [phone]    Remember:  Do not eat or drink anything after midnight the night before your surgery   Take these medicines the morning of surgery with A SIP OF WATER: Norvasc, Catapres,Apresoline,Protonix. If needed take- Tylenol, Imitrex.    As of today, STOP taking any Aspirin (unless otherwise instructed by your surgeon) Aleve, Naproxen, Ibuprofen, Motrin, Advil, Goody's, BC's, all herbal medications, fish oil, and all vitamins.  [facility] is not responsible for any belongings or valuables. .   Do NOT Smoke (Tobacco/Vaping)  24 hours prior to your procedure  If you use a CPAP at night, you may bring your mask for your overnight stay.   Contacts, glasses, hearing aids, dentures or partials may not be worn into surgery, please bring cases for these belongings   Patients discharged the day of surgery will not be allowed to drive home, and someone needs to stay with  them for 24 hours.   SURGICAL WAITING ROOM VISITATION You may have 1 visitor in the pre-op area at a time determined by the pre-op nurse. (Visitor may not switch out)   Special instructions:    Oral Hygiene is also important to reduce your risk of infection.  Remember - BRUSH YOUR TEETH THE MORNING OF SURGERY WITH YOUR REGULAR TOOTHPASTE   Day of Surgery:  Take a shower the day of or night before with antibacterial soap. Wear Clean/Comfortable clothing the morning of surgery Do not apply any deodorants/lotions.   Do not wear jewelry or makeup Do not wear lotions, powders, perfumes/colognes, or deodorant. Do not shave 48 hours prior to surgery.  Men may shave face and neck. Do not bring valuables to the hospital. Do not wear nail polish, gel polish, artificial nails, or any other type of covering on natural nails (fingers and toes) If you have artificial nails or gel coating that need to be removed by a nail salon, please have this removed prior to surgery. Artificial nails or gel coating may interfere with anesthesia's ability to adequately monitor your vital signs. Remember to brush your teeth WITH YOUR REGULAR TOOTHPASTE.

## 2022-11-06 NOTE — Progress Notes (Signed)
Spoke with patient and instructed him to take his losartan (Cozaar) 50mg  tablet tomorrow before he comes for surgery. I told him that this is in addition to the medicines I told him to take when I called yesterday ( see SDW instruction progress note). Pt stated that he would take the losartan.

## 2022-11-06 NOTE — Progress Notes (Signed)
Anesthesia Chart Review: [name]  Case: [id number] Date/Time: 11/07/22 0715   Procedures:      PLICATION OF LARGE ANEURYSMS OF RIGHT ARTERIOVENOUS FISTULA (Right)     INSERTION OF TUNNELED DIALYSIS CATHETER   Anesthesia type: Choice   Pre-op diagnosis: Aneurysm of arteriovenous dialysis fistula; ESRD   [facility] ROOM [facility] / [facility]   Surgeons: Chuck Hint, MD       DISCUSSION: Patient is a 37 year old male for the above procedure. He had bleeding from his right brachiocaphalic AVF on 10/27/22 prompting ED visit. AVF aneurysmal. S/p fistulogram with angioplasty of right cephalic and subclavian veins on 10/27/22.   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 HD TTS.  Last cardiology visit on 05/09/22 with Robin Searing, NP.  He was referred to cardiologist Dr. Eldridge Dace after 03/02/22 echo showed LVEF 40-45%, global LV hypokinesis, mild LVH, normal RVSF, small-moderate pericardial effusion without tamponade, mild MR.  He felt decreased EF likely due to uncontrolled HTN and unlikely ischemia. He was on Coreg and amlodipine, but had him restart hydralazine and Imdur. At 05/09/22 NP follow-up, BP 174/110, 162/108. Hydralazine changed to 100 mg TID. Continue Coreg, Imdur, amlodipine. PharmD follow-up in 3 weeks recommended for titration. 06/03/22 echo showed LVEF 55%, no regional wall motion abnormalities, moderate LVH, less prominent pericardial effusion, trivial MR.   Last PharmD follow-up 08/05/22: Started on clonidine 0.1 mg BID, continue amlodipine 10mg  daily, carvedilol 25mg  BID, hyralazine 100mg  TID, and losartan 50mg  daily He was admitted 09/23/22 for chest pain and HTN evaluation. BP 210/140. He had stopped Coreg due to side effects. Topronins flat. Symptoms felt more consistent with GERD--he declined Protonix. CTA chest showed no aortic dissection, increased pericardial effusion, mild interstitial  edema, diffuse bronchial thickening, hazy basilar opacities that could reflect air trapping, pneumonitis or edema, diffuse body wall edema likely from fluid overload.   BP reading at recent HD sessions: 11/04/22: 185/100, 186/107, 178/96, 176/95  11/01/22: 174/121, 179/114, 187/96, 180/96 10/30/22: 193/118, 181/99  Current BP medications include: clonidine 0.1 mg BID, amlodipine 10 mg daily, hydralazine 100 mg TID, losartan 50 mg daily. (Losartan appears new. Coreg and Imdur are no longer on his medication list.).  Discussed with anesthesiologists Dr. Mal Amabile and Dr. Salvadore Farber. If case is not urgent then advise cardiology follow-up for uncontrolled hypertension. If surgeon feels case cannot be delayed due to bleeding risk then will evaluate on the day of surgery with additional recommendations at that time. If case remains as scheduled, recommendation is for him to continue all anti-hypertensive medications as prescribed perioperatively. He was notified by PAT RN. I have also communicated with Dr. Edilia Bo. Case is felt to be fairly urgent given risk of bleeding.    VS:  BP Readings from Last 3 Encounters:  10/27/22 (!) 195/124  10/27/22 (!) 188/121  10/10/22 (!) 192/110   Pulse Readings from Last 3 Encounters:  10/27/22 95  10/27/22 99  10/10/22 85     PROVIDERS: Sabino Dick, DO is PCP  Lance Muss, MD is cardiologist   LABS: For day of surgery. As of 10/27/22, H/H 10.2/30.0. A1c 4.6% 05/30/22.   Normal Spirometry 04/18/22.   IMAGES: CT Brain 09/23/22: IMPRESSION: 1. No acute intracranial abnormalities. The appearance of the brain is normal.  CTA Chest 09/23/22: IMPRESSION: 1. No findings of aortic aneurysm or dissection. No visible aortic plaques. 2. Cardiomegaly with left chamber predominance and left ventricular wall hypertrophy. 3.  Prominent pulmonary trunk again noted. 4. Increased pericardial effusion. 5. Mild interstitial edema in the lung bases and  apices. 6. Diffuse bronchial thickening, which could be due to bronchitis, congestive thickening or reactive airways disease. 7. Hazy opacities in the basal segments of the lower lobes which could be due to air trapping, pneumonitis or ground-glass edema. 8. Diffuse body wall and mesenteric edema likely congestive or from fluid overload. Small-volume ascites. 9. Edema around the pancreas which could be congestive or due to pancreatitis. 10. Cystitis versus bladder nondistention. 11. Rectal wall thickening and adjacent edema which could be dependent congestive changes or due to proctitis or infiltrating disease. 12. Diverticulosis without evidence of diverticulitis. Constipation. 13. Internal iliac atherosclerosis.   EKG: 09/23/22: Normal sinus rhythm Left ventricular hypertrophy with repolarization abnormality ( R in aVL , Sokolow-Lyon , Romhilt-Estes ) Cannot rule out Septal infarct , age undetermined Abnormal ECG When compared with ECG of 17-Sep-2022 05:51, PREVIOUS ECG IS PRESENT No significant change was found Confirmed by Glynn Octave [phone]) on 09/23/2022 1:25:59 AM   CV: Echo 06/03/22: IMPRESSIONS   1. Compared to previous echo, LVEF is improved . Left ventricular  ejection fraction, by estimation, is 55%. The left ventricle has normal  function. The left ventricle has no regional wall motion abnormalities.  There is moderate concentric left  ventricular hypertrophy. Left ventricular diastolic parameters were  normal.   2. Right ventricular systolic function is normal. The right ventricular  size is normal.   3. Compared to echo from October 2023, pericardial effusion is a little  less prominence.Marland Kitchen a small pericardial effusion is present. The pericardial  effusion is circumferential.   4. The mitral valve is abnormal. Trivial mitral valve regurgitation. No  evidence of mitral stenosis.   5. The aortic valve is tricuspid. Aortic valve regurgitation is not   visualized. No aortic stenosis is present.   6. The inferior vena cava is dilated in size with <50% respiratory  variability, suggesting right atrial pressure of 15 mmHg.    Past Medical History:  Diagnosis Date   Bipolar 1 disorder (HCC)    CKD (chronic kidney disease)    on dialysis   Depression    GERD (gastroesophageal reflux disease)    Hearing difficulty of left ear    75% hearing   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   Hypertension    Low blood sugar    Renal disorder     Past Surgical History:  Procedure Laterality Date   A/V FISTULAGRAM Right 10/27/2022   Procedure: A/V Fistulagram;  Surgeon: Chuck Hint, MD;  Location: [facility] INVASIVE CV LAB;  Service: Cardiovascular;  Laterality: Right;   APPENDECTOMY     AV FISTULA PLACEMENT Left 03/03/2019   Procedure: ARTERIOVENOUS (AV) FISTULA CREATION LEFT ARM;  Surgeon: Maeola Harman, MD;  Location: [facility] OR;  Service: Vascular;  Laterality: Left;   BASCILIC VEIN TRANSPOSITION Right 04/12/2019   Procedure: RIGHT UPPER EXTREMITY BASCILIC VEIN TRANSPOSITION FIRST STAGE FISTULA;  Surgeon: Chuck Hint, MD;  Location: [facility] OR;  Service: Vascular;  Laterality: Right;   BIOPSY  10/14/2021   Procedure: BIOPSY;  Surgeon: Sherrilyn Rist, MD;  Location: [facility] ENDOSCOPY;  Service: Gastroenterology;;   BIOPSY  06/06/2022   Procedure: BIOPSY;  Surgeon: Napoleon Form, MD;  Location: [facility] ENDOSCOPY;  Service: Gastroenterology;;   COLONOSCOPY WITH PROPOFOL N/A 10/14/2021   Procedure: COLONOSCOPY WITH PROPOFOL;  Surgeon: Sherrilyn Rist, MD;  Location: [facility]  ENDOSCOPY;  Service: Gastroenterology;  Laterality: N/A;   ESOPHAGOGASTRODUODENOSCOPY (EGD) WITH PROPOFOL N/A 06/06/2022   Procedure: ESOPHAGOGASTRODUODENOSCOPY (EGD) WITH PROPOFOL;  Surgeon: Napoleon Form, MD;  Location: [facility] ENDOSCOPY;  Service: Gastroenterology;  Laterality: N/A;   FLEXIBLE SIGMOIDOSCOPY N/A 04/14/2022   Procedure: FLEXIBLE  SIGMOIDOSCOPY;  Surgeon: Napoleon Form, MD;  Location: [facility] ENDOSCOPY;  Service: Gastroenterology;  Laterality: N/A;   LIGATION OF ARTERIOVENOUS  FISTULA Left 03/06/2019   Procedure: LIGATION OF ARTERIOVENOUS  FISTULA;  Surgeon: Cephus Shelling, MD;  Location: [facility] OR;  Service: Vascular;  Laterality: Left;   PERIPHERAL VASCULAR BALLOON ANGIOPLASTY  10/27/2022   Procedure: PERIPHERAL VASCULAR BALLOON ANGIOPLASTY;  Surgeon: Chuck Hint, MD;  Location: [facility] INVASIVE CV LAB;  Service: Cardiovascular;;  rt upper arm fistula   spinal tap     SPINE SURGERY     related to a spinal infection, unsure of surgery or infection source   WISDOM TOOTH EXTRACTION      MEDICATIONS:  0.9 %  sodium chloride infusion   sodium chloride flush (NS) 0.9 % injection 3 mL    acetaminophen (TYLENOL) 325 MG tablet   amLODipine (NORVASC) 10 MG tablet   calcium acetate (PHOSLO) 667 MG capsule   cloNIDine (CATAPRES) 0.1 MG tablet   divalproex (DEPAKOTE ER) 500 MG 24 hr tablet   ferric citrate (AURYXIA) 1 GM 210 MG(Fe) tablet   hydrALAZINE (APRESOLINE) 100 MG tablet   losartan (COZAAR) 50 MG tablet   pantoprazole (PROTONIX) 40 MG tablet   SUMAtriptan (IMITREX) 50 MG tablet   triamcinolone ointment (KENALOG) 0.1 %    Shonna Chock, PA-C Surgical Short Stay/Anesthesiology [facility] Phone [phone] [facility] Phone [phone] 11/06/2022 2:51 PM

## 2022-11-06 NOTE — Anesthesia Preprocedure Evaluation (Addendum)
Anesthesia Evaluation  Patient identified by MRN, date of birth, ID band Patient awake    Reviewed: Allergy & Precautions, NPO status , Patient's Chart, lab work & pertinent test results  History of Anesthesia Complications Negative for: history of anesthetic complications  Airway Mallampati: III  TM Distance: >3 FB Neck ROM: Full    Dental  (+) Teeth Intact, Dental Advisory Given   Pulmonary neg shortness of breath, neg COPD, neg recent URI, Current Smoker and Patient abstained from smoking.   breath sounds clear to auscultation       Cardiovascular hypertension, Pt. on medications (-) angina (-) Past MI and (-) CHF  Rhythm:Regular  1. Compared to previous echo, LVEF is improved . Left ventricular  ejection fraction, by estimation, is 55%. The left ventricle has normal  function. The left ventricle has no regional wall motion abnormalities.  There is moderate concentric left  ventricular hypertrophy. Left ventricular diastolic parameters were  normal.   2. Right ventricular systolic function is normal. The right ventricular  size is normal.   3. Compared to echo from October 2023, pericardial effusion is a little  less prominence.Marland Kitchen a small pericardial effusion is present. The pericardial  effusion is circumferential.   4. The mitral valve is abnormal. Trivial mitral valve regurgitation. No  evidence of mitral stenosis.   5. The aortic valve is tricuspid. Aortic valve regurgitation is not  visualized. No aortic stenosis is present.   6. The inferior vena cava is dilated in size with <50% respiratory  variability, suggesting right atrial pressure of 15 mmHg.     Neuro/Psych  Headaches, neg Seizures    GI/Hepatic Neg liver ROS,GERD  Medicated and Controlled,,  Endo/Other  negative endocrine ROS    Renal/GU ESRF and DialysisRenal diseaseLab Results      Component                Value               Date                       NA                       137                 11/07/2022                K                        4.6                 11/07/2022                CO2                      26                  10/27/2022                GLUCOSE                  85                  11/07/2022                BUN  59 (H)              11/07/2022                CREATININE               10.50 (H)           11/07/2022                CALCIUM                  9.5                 10/27/2022                EGFR                     4 (L)               06/16/2022                GFRNONAA                 6 (L)               10/27/2022            Last hd yesterday     Musculoskeletal   Abdominal   Peds  Hematology  (+) Blood dyscrasia, anemia Lab Results      Component                Value               Date                      WBC                      5.6                 10/27/2022                HGB                      9.9 (L)             11/07/2022                HCT                      29.0 (L)            11/07/2022                MCV                      98.3                10/27/2022                PLT                      232                 10/27/2022              Anesthesia Other Findings   Reproductive/Obstetrics  Anesthesia Physical Anesthesia Plan  ASA: 3  Anesthesia Plan: General   Post-op Pain Management: Ofirmev IV (intra-op)*   Induction: Intravenous  PONV Risk Score and Plan: 1 and Ondansetron and Dexamethasone  Airway Management Planned: Oral ETT  Additional Equipment: None  Intra-op Plan:   Post-operative Plan:   Informed Consent: I have reviewed the patients History and Physical, chart, labs and discussed the procedure including the risks, benefits and alternatives for the proposed anesthesia with the patient or authorized representative who has indicated his/her understanding and acceptance.     Dental  advisory given  Plan Discussed with: CRNA  Anesthesia Plan Comments: (PAT note written 11/06/2022 by Shonna Chock, PA-C.  )        Anesthesia Quick Evaluation

## 2022-11-07 ENCOUNTER — Other Ambulatory Visit: Payer: Self-pay

## 2022-11-07 ENCOUNTER — Encounter (HOSPITAL_COMMUNITY): Payer: Self-pay | Admitting: Vascular Surgery

## 2022-11-07 ENCOUNTER — Inpatient Hospital Stay (HOSPITAL_COMMUNITY)
Admission: AD | Admit: 2022-11-07 | Discharge: 2022-11-14 | DRG: 252 | Disposition: A | Discharge status: Home / Self Care | Payer: Medicare Other | Attending: Vascular Surgery | Admitting: Vascular Surgery

## 2022-11-07 ENCOUNTER — Ambulatory Visit (HOSPITAL_COMMUNITY): Payer: Medicare Other

## 2022-11-07 ENCOUNTER — Encounter (HOSPITAL_COMMUNITY)
Admission: AD | Disposition: A | Discharge status: Home / Self Care | Payer: Self-pay | Source: Home / Self Care | Attending: Vascular Surgery

## 2022-11-07 ENCOUNTER — Telehealth: Payer: Self-pay | Admitting: Vascular Surgery

## 2022-11-07 ENCOUNTER — Ambulatory Visit (HOSPITAL_COMMUNITY): Payer: Medicare Other | Admitting: Vascular Surgery

## 2022-11-07 DIAGNOSIS — I5032 Chronic diastolic (congestive) heart failure: Secondary | ICD-10-CM

## 2022-11-07 DIAGNOSIS — R1314 Dysphagia, pharyngoesophageal phase: Secondary | ICD-10-CM | POA: Diagnosis present

## 2022-11-07 DIAGNOSIS — Z992 Dependence on renal dialysis: Secondary | ICD-10-CM

## 2022-11-07 DIAGNOSIS — F4325 Adjustment disorder with mixed disturbance of emotions and conduct: Secondary | ICD-10-CM | POA: Diagnosis not present

## 2022-11-07 DIAGNOSIS — D649 Anemia, unspecified: Secondary | ICD-10-CM | POA: Insufficient documentation

## 2022-11-07 DIAGNOSIS — I721 Aneurysm of artery of upper extremity: Secondary | ICD-10-CM | POA: Diagnosis present

## 2022-11-07 DIAGNOSIS — I10 Essential (primary) hypertension: Principal | ICD-10-CM | POA: Diagnosis present

## 2022-11-07 DIAGNOSIS — Z833 Family history of diabetes mellitus: Secondary | ICD-10-CM

## 2022-11-07 DIAGNOSIS — Z823 Family history of stroke: Secondary | ICD-10-CM

## 2022-11-07 DIAGNOSIS — F319 Bipolar disorder, unspecified: Secondary | ICD-10-CM | POA: Diagnosis present

## 2022-11-07 DIAGNOSIS — K5900 Constipation, unspecified: Secondary | ICD-10-CM | POA: Diagnosis not present

## 2022-11-07 DIAGNOSIS — Y712 Prosthetic and other implants, materials and accessory cardiovascular devices associated with adverse incidents: Secondary | ICD-10-CM | POA: Diagnosis present

## 2022-11-07 DIAGNOSIS — M898X9 Other specified disorders of bone, unspecified site: Secondary | ICD-10-CM | POA: Diagnosis present

## 2022-11-07 DIAGNOSIS — Q8781 Alport syndrome: Secondary | ICD-10-CM

## 2022-11-07 DIAGNOSIS — I132 Hypertensive heart and chronic kidney disease with heart failure and with stage 5 chronic kidney disease, or end stage renal disease: Secondary | ICD-10-CM | POA: Diagnosis present

## 2022-11-07 DIAGNOSIS — K449 Diaphragmatic hernia without obstruction or gangrene: Secondary | ICD-10-CM | POA: Diagnosis present

## 2022-11-07 DIAGNOSIS — D638 Anemia in other chronic diseases classified elsewhere: Secondary | ICD-10-CM | POA: Insufficient documentation

## 2022-11-07 DIAGNOSIS — D631 Anemia in chronic kidney disease: Secondary | ICD-10-CM | POA: Diagnosis present

## 2022-11-07 DIAGNOSIS — Z8249 Family history of ischemic heart disease and other diseases of the circulatory system: Secondary | ICD-10-CM

## 2022-11-07 DIAGNOSIS — K219 Gastro-esophageal reflux disease without esophagitis: Secondary | ICD-10-CM | POA: Diagnosis present

## 2022-11-07 DIAGNOSIS — Z841 Family history of disorders of kidney and ureter: Secondary | ICD-10-CM

## 2022-11-07 DIAGNOSIS — R1319 Other dysphagia: Secondary | ICD-10-CM

## 2022-11-07 DIAGNOSIS — N186 End stage renal disease: Secondary | ICD-10-CM

## 2022-11-07 DIAGNOSIS — T82898A Other specified complication of vascular prosthetic devices, implants and grafts, initial encounter: Secondary | ICD-10-CM | POA: Diagnosis present

## 2022-11-07 DIAGNOSIS — R079 Chest pain, unspecified: Secondary | ICD-10-CM | POA: Diagnosis present

## 2022-11-07 DIAGNOSIS — Z822 Family history of deafness and hearing loss: Secondary | ICD-10-CM

## 2022-11-07 DIAGNOSIS — I120 Hypertensive chronic kidney disease with stage 5 chronic kidney disease or end stage renal disease: Secondary | ICD-10-CM | POA: Diagnosis not present

## 2022-11-07 DIAGNOSIS — F17210 Nicotine dependence, cigarettes, uncomplicated: Secondary | ICD-10-CM | POA: Diagnosis present

## 2022-11-07 DIAGNOSIS — T82510A Breakdown (mechanical) of surgically created arteriovenous fistula, initial encounter: Principal | ICD-10-CM | POA: Diagnosis present

## 2022-11-07 DIAGNOSIS — R051 Acute cough: Secondary | ICD-10-CM

## 2022-11-07 DIAGNOSIS — H9193 Unspecified hearing loss, bilateral: Secondary | ICD-10-CM | POA: Diagnosis present

## 2022-11-07 DIAGNOSIS — E162 Hypoglycemia, unspecified: Secondary | ICD-10-CM | POA: Diagnosis not present

## 2022-11-07 DIAGNOSIS — R933 Abnormal findings on diagnostic imaging of other parts of digestive tract: Secondary | ICD-10-CM

## 2022-11-07 DIAGNOSIS — Z79899 Other long term (current) drug therapy: Secondary | ICD-10-CM

## 2022-11-07 DIAGNOSIS — D539 Nutritional anemia, unspecified: Secondary | ICD-10-CM | POA: Insufficient documentation

## 2022-11-07 DIAGNOSIS — F1223 Cannabis dependence with withdrawal: Secondary | ICD-10-CM | POA: Clinically undetermined

## 2022-11-07 DIAGNOSIS — K2980 Duodenitis without bleeding: Secondary | ICD-10-CM | POA: Diagnosis present

## 2022-11-07 DIAGNOSIS — Z5900 Homelessness unspecified: Secondary | ICD-10-CM

## 2022-11-07 DIAGNOSIS — G43909 Migraine, unspecified, not intractable, without status migrainosus: Secondary | ICD-10-CM | POA: Diagnosis present

## 2022-11-07 DIAGNOSIS — K222 Esophageal obstruction: Secondary | ICD-10-CM | POA: Diagnosis present

## 2022-11-07 DIAGNOSIS — I169 Hypertensive crisis, unspecified: Secondary | ICD-10-CM | POA: Diagnosis present

## 2022-11-07 DIAGNOSIS — Z888 Allergy status to other drugs, medicaments and biological substances status: Secondary | ICD-10-CM

## 2022-11-07 DIAGNOSIS — I5022 Chronic systolic (congestive) heart failure: Secondary | ICD-10-CM | POA: Diagnosis present

## 2022-11-07 DIAGNOSIS — I5042 Chronic combined systolic (congestive) and diastolic (congestive) heart failure: Secondary | ICD-10-CM | POA: Diagnosis present

## 2022-11-07 DIAGNOSIS — I1A0 Resistant hypertension: Secondary | ICD-10-CM | POA: Diagnosis not present

## 2022-11-07 DIAGNOSIS — N2581 Secondary hyperparathyroidism of renal origin: Secondary | ICD-10-CM | POA: Diagnosis present

## 2022-11-07 DIAGNOSIS — I770 Arteriovenous fistula, acquired: Secondary | ICD-10-CM | POA: Diagnosis present

## 2022-11-07 DIAGNOSIS — F1220 Cannabis dependence, uncomplicated: Secondary | ICD-10-CM | POA: Diagnosis present

## 2022-11-07 DIAGNOSIS — Z825 Family history of asthma and other chronic lower respiratory diseases: Secondary | ICD-10-CM

## 2022-11-07 DIAGNOSIS — F4381 Prolonged grief disorder: Secondary | ICD-10-CM | POA: Diagnosis present

## 2022-11-07 HISTORY — PX: INSERTION OF DIALYSIS CATHETER: SHX1324

## 2022-11-07 HISTORY — DX: End stage renal disease: N18.6

## 2022-11-07 HISTORY — PX: FISTULA SUPERFICIALIZATION: SHX6341

## 2022-11-07 LAB — POCT I-STAT, CHEM 8
BUN: 59 mg/dL — ABNORMAL HIGH (ref 6–20)
Calcium, Ion: 1.14 mmol/L — ABNORMAL LOW (ref 1.15–1.40)
Chloride: 99 mmol/L (ref 98–111)
Creatinine, Ser: 10.5 mg/dL — ABNORMAL HIGH (ref 0.61–1.24)
Glucose, Bld: 85 mg/dL (ref 70–99)
HCT: 29 % — ABNORMAL LOW (ref 39.0–52.0)
Hemoglobin: 9.9 g/dL — ABNORMAL LOW (ref 13.0–17.0)
Potassium: 4.6 mmol/L (ref 3.5–5.1)
Sodium: 137 mmol/L (ref 135–145)
TCO2: 29 mmol/L (ref 22–32)

## 2022-11-07 LAB — HEPATITIS B SURFACE ANTIGEN: Hepatitis B Surface Ag: NONREACTIVE

## 2022-11-07 LAB — GLUCOSE, CAPILLARY
Glucose-Capillary: 81 mg/dL (ref 70–99)
Glucose-Capillary: 82 mg/dL (ref 70–99)
Glucose-Capillary: 79 mg/dL (ref 70–99)
Glucose-Capillary: 106 mg/dL — ABNORMAL HIGH (ref 70–99)

## 2022-11-07 SURGERY — FISTULA SUPERFICIALIZATION
Anesthesia: General | Site: Chest | Laterality: Right

## 2022-11-07 MED ORDER — SODIUM CHLORIDE 0.9 % IV SOLN: INTRAVENOUS | Status: DC

## 2022-11-07 MED ORDER — OXYCODONE HCL 5 MG/5ML PO SOLN: 5.0000 mg | Freq: Once | ORAL | Status: AC | PRN

## 2022-11-07 MED ORDER — MIDAZOLAM HCL (PF) 10 MG/2ML IJ SOLN
INTRAMUSCULAR | Status: AC
  Filled 2022-11-07: qty 2

## 2022-11-07 MED ORDER — ALUM & MAG HYDROXIDE-SIMETH 200-200-20 MG/5ML PO SUSP: 15.0000 mL | ORAL | Status: DC | PRN

## 2022-11-07 MED ORDER — METOPROLOL TARTRATE 5 MG/5ML IV SOLN
INTRAVENOUS | Status: AC
  Filled 2022-11-07: qty 5

## 2022-11-07 MED ORDER — OXYCODONE-ACETAMINOPHEN 5-325 MG PO TABS: 1.0000 | ORAL_TABLET | ORAL | 0 refills | Status: DC | PRN

## 2022-11-07 MED ORDER — ACETAMINOPHEN 325 MG PO TABS
650.0000 mg | ORAL_TABLET | Freq: Four times a day (QID) | ORAL | Status: DC | PRN
  Administered 2022-11-07 – 2022-11-12 (×3): 650 mg via ORAL
  Filled 2022-11-07 (×3): qty 2

## 2022-11-07 MED ORDER — HEPARIN SODIUM (PORCINE) 1000 UNIT/ML DIALYSIS
1500.0000 [IU] | INTRAMUSCULAR | Status: DC | PRN
  Administered 2022-11-07: 1500 [IU] via INTRAVENOUS_CENTRAL
  Filled 2022-11-07 (×2): qty 2

## 2022-11-07 MED ORDER — CLONIDINE HCL 0.1 MG PO TABS
0.1000 mg | ORAL_TABLET | Freq: Two times a day (BID) | ORAL | Status: DC
  Administered 2022-11-07 – 2022-11-08 (×2): 0.1 mg via ORAL
  Filled 2022-11-07 (×2): qty 1

## 2022-11-07 MED ORDER — HEPARIN SODIUM (PORCINE) 1000 UNIT/ML IJ SOLN
INTRAMUSCULAR | Status: AC
  Filled 2022-11-07: qty 10

## 2022-11-07 MED ORDER — ORAL CARE MOUTH RINSE: 15.0000 mL | Freq: Once | OROMUCOSAL | Status: AC

## 2022-11-07 MED ORDER — FENTANYL CITRATE (PF) 100 MCG/2ML IJ SOLN
INTRAMUSCULAR | Status: AC
  Filled 2022-11-07: qty 2

## 2022-11-07 MED ORDER — ACETAMINOPHEN 10 MG/ML IV SOLN: 1000.0000 mg | Freq: Once | INTRAVENOUS | Status: DC | PRN

## 2022-11-07 MED ORDER — OXYCODONE-ACETAMINOPHEN 5-325 MG PO TABS
2.0000 | ORAL_TABLET | Freq: Four times a day (QID) | ORAL | Status: DC | PRN
  Administered 2022-11-07 – 2022-11-13 (×5): 2 via ORAL
  Filled 2022-11-07 (×5): qty 2

## 2022-11-07 MED ORDER — ONDANSETRON HCL 4 MG/2ML IJ SOLN
INTRAMUSCULAR | Status: DC | PRN
  Administered 2022-11-07: 4 mg via INTRAVENOUS

## 2022-11-07 MED ORDER — HEPARIN 6000 UNIT IRRIGATION SOLUTION
Status: DC | PRN
  Administered 2022-11-07: 1

## 2022-11-07 MED ORDER — GUAIFENESIN-DM 100-10 MG/5ML PO SYRP: 15.0000 mL | ORAL_SOLUTION | ORAL | Status: DC | PRN

## 2022-11-07 MED ORDER — PANTOPRAZOLE SODIUM 40 MG PO TBEC
40.0000 mg | DELAYED_RELEASE_TABLET | Freq: Two times a day (BID) | ORAL | Status: DC
  Administered 2022-11-07 – 2022-11-14 (×13): 40 mg via ORAL
  Filled 2022-11-07 (×13): qty 1

## 2022-11-07 MED ORDER — FENTANYL CITRATE (PF) 250 MCG/5ML IJ SOLN
INTRAMUSCULAR | Status: DC | PRN
  Administered 2022-11-07 (×3): 50 ug via INTRAVENOUS

## 2022-11-07 MED ORDER — DEXAMETHASONE SODIUM PHOSPHATE 10 MG/ML IJ SOLN
INTRAMUSCULAR | Status: DC | PRN
  Administered 2022-11-07: 4 mg via INTRAVENOUS

## 2022-11-07 MED ORDER — PHENYLEPHRINE 80 MCG/ML (10ML) SYRINGE FOR IV PUSH (FOR BLOOD PRESSURE SUPPORT)
PREFILLED_SYRINGE | INTRAVENOUS | Status: DC | PRN
  Administered 2022-11-07 (×3): 80 ug via INTRAVENOUS

## 2022-11-07 MED ORDER — HYDRALAZINE HCL 50 MG PO TABS
100.0000 mg | ORAL_TABLET | Freq: Three times a day (TID) | ORAL | Status: DC
  Administered 2022-11-07 – 2022-11-14 (×18): 100 mg via ORAL
  Filled 2022-11-07 (×19): qty 2

## 2022-11-07 MED ORDER — LIDOCAINE-EPINEPHRINE (PF) 1 %-1:200000 IJ SOLN
INTRAMUSCULAR | Status: DC | PRN
  Administered 2022-11-07: 16 mL via INTRADERMAL

## 2022-11-07 MED ORDER — DIVALPROEX SODIUM ER 500 MG PO TB24
500.0000 mg | ORAL_TABLET | Freq: Every day | ORAL | Status: DC
  Administered 2022-11-07 – 2022-11-13 (×7): 500 mg via ORAL
  Filled 2022-11-07 (×8): qty 1

## 2022-11-07 MED ORDER — ACETAMINOPHEN 500 MG PO TABS: 1000.0000 mg | ORAL_TABLET | Freq: Once | ORAL | Status: DC | PRN

## 2022-11-07 MED ORDER — PHENYLEPHRINE HCL-NACL 20-0.9 MG/250ML-% IV SOLN
INTRAVENOUS | Status: DC | PRN
  Administered 2022-11-07: 35 ug/min via INTRAVENOUS

## 2022-11-07 MED ORDER — DEXAMETHASONE SODIUM PHOSPHATE 10 MG/ML IJ SOLN
INTRAMUSCULAR | Status: AC
  Filled 2022-11-07: qty 1

## 2022-11-07 MED ORDER — PROPOFOL 10 MG/ML IV BOLUS
INTRAVENOUS | Status: AC
  Filled 2022-11-07: qty 20

## 2022-11-07 MED ORDER — HYDRALAZINE HCL 20 MG/ML IJ SOLN
10.0000 mg | Freq: Once | INTRAMUSCULAR | Status: AC
  Administered 2022-11-07: 10 mg via INTRAVENOUS
  Filled 2022-11-07: qty 1

## 2022-11-07 MED ORDER — ACETAMINOPHEN 160 MG/5ML PO SOLN: 1000.0000 mg | Freq: Once | ORAL | Status: DC | PRN

## 2022-11-07 MED ORDER — MORPHINE SULFATE (PF) 2 MG/ML IV SOLN
2.0000 mg | INTRAVENOUS | Status: DC | PRN
  Administered 2022-11-07: 2 mg via INTRAVENOUS
  Administered 2022-11-08 – 2022-11-09 (×5): 4 mg via INTRAVENOUS
  Filled 2022-11-07: qty 1
  Filled 2022-11-07 (×6): qty 2

## 2022-11-07 MED ORDER — OXYCODONE-ACETAMINOPHEN 5-325 MG PO TABS
1.0000 | ORAL_TABLET | ORAL | Status: DC | PRN
  Administered 2022-11-08 – 2022-11-11 (×5): 2 via ORAL
  Filled 2022-11-07 (×6): qty 2

## 2022-11-07 MED ORDER — PROTAMINE SULFATE 10 MG/ML IV SOLN
INTRAVENOUS | Status: AC
  Filled 2022-11-07: qty 10

## 2022-11-07 MED ORDER — HEPARIN SODIUM (PORCINE) 1000 UNIT/ML IJ SOLN
INTRAMUSCULAR | Status: DC | PRN
  Administered 2022-11-07: 7000 [IU] via INTRAVENOUS

## 2022-11-07 MED ORDER — CHLORHEXIDINE GLUCONATE 4 % EX SOLN: 60.0000 mL | Freq: Once | CUTANEOUS | Status: DC

## 2022-11-07 MED ORDER — AMLODIPINE BESYLATE 10 MG PO TABS
10.0000 mg | ORAL_TABLET | Freq: Every day | ORAL | Status: DC
  Administered 2022-11-08 – 2022-11-14 (×7): 10 mg via ORAL
  Filled 2022-11-07 (×7): qty 1

## 2022-11-07 MED ORDER — METOPROLOL TARTRATE 5 MG/5ML IV SOLN
INTRAVENOUS | Status: DC | PRN
  Administered 2022-11-07: 2.5 mg via INTRAVENOUS

## 2022-11-07 MED ORDER — ONDANSETRON HCL 4 MG/2ML IJ SOLN
4.0000 mg | Freq: Four times a day (QID) | INTRAMUSCULAR | Status: DC | PRN
  Administered 2022-11-08 (×2): 4 mg via INTRAVENOUS
  Filled 2022-11-07 (×3): qty 2

## 2022-11-07 MED ORDER — LIDOCAINE-EPINEPHRINE (PF) 1 %-1:200000 IJ SOLN
INTRAMUSCULAR | Status: AC
  Filled 2022-11-07: qty 30

## 2022-11-07 MED ORDER — MIDAZOLAM HCL 2 MG/2ML IJ SOLN
INTRAMUSCULAR | Status: AC
  Filled 2022-11-07: qty 2

## 2022-11-07 MED ORDER — FENTANYL CITRATE PF 50 MCG/ML IJ SOSY
50.0000 ug | PREFILLED_SYRINGE | Freq: Once | INTRAMUSCULAR | Status: AC
  Administered 2022-11-07: 50 ug via INTRAVENOUS
  Filled 2022-11-07: qty 1

## 2022-11-07 MED ORDER — PROTAMINE SULFATE 10 MG/ML IV SOLN
INTRAVENOUS | Status: DC | PRN
  Administered 2022-11-07: 10 mg via INTRAVENOUS
  Administered 2022-11-07: 30 mg via INTRAVENOUS
  Administered 2022-11-07: 20 mg via INTRAVENOUS

## 2022-11-07 MED ORDER — CEFAZOLIN SODIUM-DEXTROSE 2-4 GM/100ML-% IV SOLN
2.0000 g | INTRAVENOUS | Status: AC
  Administered 2022-11-07: 2 g via INTRAVENOUS
  Filled 2022-11-07: qty 100

## 2022-11-07 MED ORDER — CHLORHEXIDINE GLUCONATE CLOTH 2 % EX PADS
6.0000 | MEDICATED_PAD | Freq: Every day | CUTANEOUS | Status: DC
  Administered 2022-11-09 – 2022-11-13 (×3): 6 via TOPICAL

## 2022-11-07 MED ORDER — OXYCODONE HCL 5 MG PO TABS
ORAL_TABLET | ORAL | Status: AC
  Filled 2022-11-07: qty 1

## 2022-11-07 MED ORDER — SUMATRIPTAN SUCCINATE 25 MG PO TABS
50.0000 mg | ORAL_TABLET | ORAL | Status: DC | PRN
  Administered 2022-11-08 – 2022-11-11 (×6): 50 mg via ORAL
  Filled 2022-11-07: qty 1
  Filled 2022-11-07: qty 2
  Filled 2022-11-07: qty 1
  Filled 2022-11-07 (×3): qty 2
  Filled 2022-11-07 (×3): qty 1
  Filled 2022-11-07: qty 2

## 2022-11-07 MED ORDER — ONDANSETRON HCL 4 MG/2ML IJ SOLN
INTRAMUSCULAR | Status: AC
  Filled 2022-11-07: qty 2

## 2022-11-07 MED ORDER — LOSARTAN POTASSIUM 50 MG PO TABS
50.0000 mg | ORAL_TABLET | Freq: Every day | ORAL | Status: DC
  Administered 2022-11-08: 50 mg via ORAL
  Filled 2022-11-07: qty 1

## 2022-11-07 MED ORDER — CHLORHEXIDINE GLUCONATE 0.12 % MT SOLN
15.0000 mL | Freq: Once | OROMUCOSAL | Status: AC
  Administered 2022-11-07: 15 mL via OROMUCOSAL
  Filled 2022-11-07: qty 15

## 2022-11-07 MED ORDER — HYDRALAZINE HCL 20 MG/ML IJ SOLN
10.0000 mg | INTRAMUSCULAR | Status: AC | PRN
  Administered 2022-11-08 (×2): 10 mg via INTRAVENOUS
  Filled 2022-11-07 (×2): qty 1

## 2022-11-07 MED ORDER — FENTANYL CITRATE (PF) 100 MCG/2ML IJ SOLN
25.0000 ug | INTRAMUSCULAR | Status: DC | PRN
  Administered 2022-11-07: 50 ug via INTRAVENOUS

## 2022-11-07 MED ORDER — SODIUM CHLORIDE 0.9 % IV SOLN
20.0000 ug | Freq: Once | INTRAVENOUS | Status: AC
  Administered 2022-11-07: 20 ug via INTRAVENOUS
  Filled 2022-11-07: qty 5

## 2022-11-07 MED ORDER — FERRIC CITRATE 1 GM 210 MG(FE) PO TABS
210.0000 mg | ORAL_TABLET | Freq: Three times a day (TID) | ORAL | Status: DC
  Administered 2022-11-09 – 2022-11-14 (×9): 210 mg via ORAL
  Filled 2022-11-07 (×22): qty 1

## 2022-11-07 MED ORDER — PHENOL 1.4 % MT LIQD: 1.0000 | OROMUCOSAL | Status: DC | PRN

## 2022-11-07 MED ORDER — ACETAMINOPHEN 10 MG/ML IV SOLN
INTRAVENOUS | Status: DC | PRN
  Administered 2022-11-07: 1000 mg via INTRAVENOUS

## 2022-11-07 MED ORDER — HEMOSTATIC AGENTS (NO CHARGE) OPTIME
TOPICAL | Status: DC | PRN
  Administered 2022-11-07: 1 via TOPICAL

## 2022-11-07 MED ORDER — 0.9 % SODIUM CHLORIDE (POUR BTL) OPTIME
TOPICAL | Status: DC | PRN
  Administered 2022-11-07: 1000 mL

## 2022-11-07 MED ORDER — FENTANYL CITRATE (PF) 250 MCG/5ML IJ SOLN
INTRAMUSCULAR | Status: AC
  Filled 2022-11-07: qty 5

## 2022-11-07 MED ORDER — MIDAZOLAM HCL 2 MG/2ML IJ SOLN
INTRAMUSCULAR | Status: DC | PRN
  Administered 2022-11-07: 1 mg via INTRAVENOUS

## 2022-11-07 MED ORDER — ALBUMIN HUMAN 5 % IV SOLN: INTRAVENOUS | Status: DC | PRN

## 2022-11-07 MED ORDER — HEPARIN 6000 UNIT IRRIGATION SOLUTION
Status: AC
  Filled 2022-11-07: qty 500

## 2022-11-07 MED ORDER — LIDOCAINE HCL (PF) 1 % IJ SOLN
INTRAMUSCULAR | Status: AC
  Filled 2022-11-07: qty 30

## 2022-11-07 MED ORDER — CALCIUM ACETATE (PHOS BINDER) 667 MG PO CAPS
667.0000 mg | ORAL_CAPSULE | Freq: Three times a day (TID) | ORAL | Status: DC
  Administered 2022-11-08 – 2022-11-14 (×14): 667 mg via ORAL
  Filled 2022-11-07 (×18): qty 1

## 2022-11-07 MED ORDER — LIDOCAINE 2% (20 MG/ML) 5 ML SYRINGE
INTRAMUSCULAR | Status: DC | PRN
  Administered 2022-11-07: 60 mg via INTRAVENOUS

## 2022-11-07 MED ORDER — OXYCODONE HCL 5 MG PO TABS
5.0000 mg | ORAL_TABLET | Freq: Once | ORAL | Status: AC | PRN
  Administered 2022-11-07: 5 mg via ORAL

## 2022-11-07 MED ORDER — HEPARIN SODIUM (PORCINE) 1000 UNIT/ML IJ SOLN
INTRAMUSCULAR | Status: DC | PRN
  Administered 2022-11-07: 3200 [IU] via INTRAPERITONEAL

## 2022-11-07 MED ORDER — ROCURONIUM BROMIDE 10 MG/ML (PF) SYRINGE
PREFILLED_SYRINGE | INTRAVENOUS | Status: DC | PRN
  Administered 2022-11-07: 50 mg via INTRAVENOUS
  Administered 2022-11-07: 20 mg via INTRAVENOUS

## 2022-11-07 MED ORDER — ZOLPIDEM TARTRATE 5 MG PO TABS
5.0000 mg | ORAL_TABLET | Freq: Every evening | ORAL | Status: DC | PRN
  Administered 2022-11-07 – 2022-11-13 (×5): 5 mg via ORAL
  Filled 2022-11-07 (×5): qty 1

## 2022-11-07 MED ORDER — SUGAMMADEX SODIUM 200 MG/2ML IV SOLN
INTRAVENOUS | Status: DC | PRN
  Administered 2022-11-07: 200 mg via INTRAVENOUS

## 2022-11-07 MED ORDER — PROPOFOL 10 MG/ML IV BOLUS
INTRAVENOUS | Status: DC | PRN
  Administered 2022-11-07: 150 mg via INTRAVENOUS

## 2022-11-07 SURGICAL SUPPLY — 55 items
ADH SKN CLS APL DERMABOND .7 (GAUZE/BANDAGES/DRESSINGS) ×4
AGENT HMST 10 BLLW SHRT CANN (HEMOSTASIS) ×2
APL PRP STRL LF DISP 70% ISPRP (MISCELLANEOUS) ×2
ARMBAND PINK RESTRICT EXTREMIT (MISCELLANEOUS) ×2 IMPLANT
BAG COUNTER SPONGE SURGICOUNT (BAG) ×2 IMPLANT
BAG SPNG CNTER NS LX DISP (BAG) ×2
BIOPATCH RED 1 DISK 7.0 (GAUZE/BANDAGES/DRESSINGS) ×2 IMPLANT
BIOPATCH WHT 1IN DISK W/4.0 H (GAUZE/BANDAGES/DRESSINGS) IMPLANT
BNDG CMPR MED 10X6 ELC LF (GAUZE/BANDAGES/DRESSINGS) ×2
BNDG ELASTIC 6X10 VLCR STRL LF (GAUZE/BANDAGES/DRESSINGS) IMPLANT
CANISTER SUCT 3000ML PPV (MISCELLANEOUS) ×2 IMPLANT
CANNULA VESSEL 3MM 2 BLNT TIP (CANNULA) ×2 IMPLANT
CATH PALINDROME-P 19CM W/VT (CATHETERS) IMPLANT
CATH PALINDROME-P 28CM W/VT (CATHETERS) IMPLANT
CHLORAPREP W/TINT 26 (MISCELLANEOUS) ×2 IMPLANT
CLIP TI MEDIUM 6 (CLIP) ×2 IMPLANT
CLIP TI WIDE RED SMALL 6 (CLIP) ×2 IMPLANT
COVER DOME SNAP 22 D (MISCELLANEOUS) IMPLANT
COVER PROBE W GEL 5X96 (DRAPES) ×2 IMPLANT
DERMABOND ADVANCED .7 DNX12 (GAUZE/BANDAGES/DRESSINGS) ×2 IMPLANT
DRAPE CHEST BREAST 15X10 FENES (DRAPES) ×2 IMPLANT
ELECT REM PT RETURN 9FT ADLT (ELECTROSURGICAL) ×2
ELECTRODE REM PT RTRN 9FT ADLT (ELECTROSURGICAL) ×2 IMPLANT
GLOVE BIO SURGEON STRL SZ7.5 (GLOVE) ×2 IMPLANT
GLOVE BIOGEL PI IND STRL 8 (GLOVE) ×2 IMPLANT
GLOVE SURG POLY ORTHO LF SZ7.5 (GLOVE) IMPLANT
GLOVE SURG UNDER LTX SZ8 (GLOVE) ×2 IMPLANT
GOWN STRL REUS W/ TWL LRG LVL3 (GOWN DISPOSABLE) ×6 IMPLANT
GOWN STRL REUS W/TWL LRG LVL3 (GOWN DISPOSABLE) ×6
GRAFT VASCULAR 7X40 (Vascular Products) IMPLANT
HEMOSTAT HEMOBLAST BELLOWS (HEMOSTASIS) IMPLANT
KIT BASIN OR (CUSTOM PROCEDURE TRAY) ×2 IMPLANT
KIT TURNOVER KIT B (KITS) ×2 IMPLANT
NDL 18GX1X1/2 (RX/OR ONLY) (NEEDLE) ×2 IMPLANT
NDL HYPO 25GX1X1/2 BEV (NEEDLE) ×2 IMPLANT
NEEDLE 18GX1X1/2 (RX/OR ONLY) (NEEDLE) ×2 IMPLANT
NEEDLE HYPO 25GX1X1/2 BEV (NEEDLE) ×2 IMPLANT
NS IRRIG 1000ML POUR BTL (IV SOLUTION) ×2 IMPLANT
PACK CV ACCESS (CUSTOM PROCEDURE TRAY) ×2 IMPLANT
PAD ARMBOARD 7.5X6 YLW CONV (MISCELLANEOUS) ×4 IMPLANT
SET MICROPUNCTURE 5F STIFF (MISCELLANEOUS) IMPLANT
SLEEVE SURGEON STRL (DRAPES) IMPLANT
SPONGE T-LAP 18X18 ~~LOC~~+RFID (SPONGE) IMPLANT
SUT ETHILON 3 0 PS 1 (SUTURE) ×2 IMPLANT
SUT MNCRL AB 4-0 PS2 18 (SUTURE) ×2 IMPLANT
SUT PROLENE 6 0 BV (SUTURE) ×2 IMPLANT
SUT VIC AB 3-0 SH 27 (SUTURE) ×4
SUT VIC AB 3-0 SH 27X BRD (SUTURE) ×2 IMPLANT
SYR 10ML LL (SYRINGE) ×2 IMPLANT
SYR 20ML LL LF (SYRINGE) ×4 IMPLANT
SYR 5ML LL (SYRINGE) ×4 IMPLANT
TOWEL GREEN STERILE (TOWEL DISPOSABLE) ×4 IMPLANT
TOWEL GREEN STERILE FF (TOWEL DISPOSABLE) ×2 IMPLANT
UNDERPAD 30X36 HEAVY ABSORB (UNDERPADS AND DIAPERS) ×2 IMPLANT
WATER STERILE IRR 1000ML POUR (IV SOLUTION) ×2 IMPLANT

## 2022-11-07 NOTE — Interval H&P Note (Signed)
History and Physical Interval Note:  11/07/2022 7:23 AM  [name]  has presented today for surgery, with the diagnosis of Aneurysm of arteriovenous dialysis fistula; End Stage Renal Disease.  The various methods of treatment have been discussed with the patient and family. After consideration of risks, benefits and other options for treatment, the patient has consented to  Procedure(s): PLICATION OF LARGE ANEURYSMS OF RIGHT ARTERIOVENOUS FISTULA (Right) INSERTION OF TUNNELED DIALYSIS CATHETER (N/A) as a surgical intervention.  The patient's history has been reviewed, patient examined, no change in status, stable for surgery.  I have reviewed the patient's chart and labs.  Questions were answered to the patient's satisfaction.     Waverly Ferrari

## 2022-11-07 NOTE — Anesthesia Procedure Notes (Signed)
Procedure Name: Intubation Date/Time: 11/07/2022 7:54 AM  Performed by: Audie Pinto, CRNAPre-anesthesia Checklist: Patient identified, Emergency Drugs available, Suction available and Patient being monitored Patient Re-evaluated:Patient Re-evaluated prior to induction Oxygen Delivery Method: Circle system utilized Preoxygenation: Pre-oxygenation with 100% oxygen Induction Type: IV induction Ventilation: Mask ventilation without difficulty Laryngoscope Size: Mac and 4 Grade View: Grade I Tube type: Oral Tube size: 7.0 mm Number of attempts: 1 Airway Equipment and Method: Stylet and Oral airway Placement Confirmation: ETT inserted through vocal cords under direct vision, positive ETCO2 and breath sounds checked- equal and bilateral Secured at: 22 cm Tube secured with: Tape Dental Injury: Teeth and Oropharynx as per pre-operative assessment

## 2022-11-07 NOTE — Op Note (Signed)
    NAME: [name]    MRN: [id number] DOB: October 19, 1985    DATE OF OPERATION: 11/07/2022  PREOP DIAGNOSIS:    Aneurysmal right upper arm fistula  POSTOP DIAGNOSIS:    Same  PROCEDURE:    Ultrasound-guided placement of right IJ tunneled dialysis catheter (19 cm) Excision of large aneurysmal right upper arm fistula with revision using 7 mm PTFE graft  SURGEON: Di Kindle. Edilia Bo, MD  ASSIST: Deniece Ree, RNFA  ANESTHESIA: General  EBL: 200 cc  INDICATIONS:    [name] is a 37 y.o. male with a large aneurysmal right upper arm fistula.  He was reluctant to place new access and wanted to try to revise this.  FINDINGS:   I excised the 2 large aneurysms.  There is no way to plicate this and salvage the fistula.  I therefore placed an interposition 7 mm PTFE graft to replace the segment that was removed.  TECHNIQUE:   The patient was taken to the operating room and received a general anesthetic.  The neck and upper chest were prepped and draped in usual sterile fashion.  Under ultrasound guidance I cannulated the right IJ and a micropuncture wire introduced and confirmed under fluoroscopy.  I then selected the exit site of the catheter in the 19 cm catheter was tunneled to the wire.  The wire was then exchanged for a Bentson wire over the micropuncture sheath and then the tract over the wire was dilated and then the dilator and peel-away sheath advanced over the wire.  The wire and dilator were removed.  The catheter was passed through the peel-away sheath and positioned at the cavoatrial junction.  Both ports withdrew easily.  Position of the cath was confirmed under fluoroscopy.  The catheter was flushed with heparinized saline.  It was secured at its exit site with a 3-0 nylon suture.  The IJ cannulation site was closed with a 4-0 Monocryl.  Attention was turned to the right arm.  I made incisions encompassing the 2 largest aneurysms in the fistula was dissected free  circumferentially here.  I controlled the vessels proximal and distal to this and heparinized the patient.  The fistula was clamped proximally distally.  The large aneurysms were excised.  A segment of 7 mm PTFE graft was then sewn end to end to the fistula above the antecubital level with continuous 6-0 Prolene suture.  The graft is then pulled the problem for anastomosis to the distal vein which was done end-to-end with continuous 6-0 Prolene suture.  At the completion was significant bleeding as the patient had significant hypertension.  Using hemostatic agents and reversing his heparin and additionally given the DDAVP we ultimately were able to get hemostasis.  Each of the wounds was then closed with running 3-0 Vicryl and then the skin was closed with 4-0 Monocryl.  Dermabond was applied.  A pressure dressing was applied.  The patient tolerated procedure well and was transferred to recovery in stable condition.  All needle and sponge counts were correct.  Given the complexity of the case a first assistant was necessary in order to expedient the procedure and safely perform the technical aspects of the operation.  Waverly Ferrari, MD, FACS [facility]  DATE OF DICTATION:   11/07/2022
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
## 2022-11-07 NOTE — Transfer of Care (Signed)
Immediate Anesthesia Transfer of Care Note  Patient: [name]  Procedure(s) Performed: PLICATION OF LARGE ANEURYSMS OF RIGHT ARTERIOVENOUS FISTULA WITH INSERTION OF 7mm INTERPOSITIONAL GORTEX GRAFT (Right: Arm Upper) INSERTION OF RIGHT INTERNAL JUGULAR TUNNELED DIALYSIS CATHETER (Chest)  Patient Location: PACU  Anesthesia Type:General  Level of Consciousness: drowsy and patient cooperative  Airway & Oxygen Therapy: Patient Spontanous Breathing and Patient connected to face mask oxygen  Post-op Assessment: Report given to RN and Post -op Vital signs reviewed and stable  Post vital signs: Reviewed and stable  Last Vitals:  Vitals Value Taken Time  BP 178/105 11/07/22 1149  Temp    Pulse 105 11/07/22 1153  Resp 19 11/07/22 1153  SpO2 96 % 11/07/22 1153  Vitals shown include unvalidated device data.  Last Pain:  Vitals:   11/07/22 0653  TempSrc:   PainSc: 0-No pain         Complications: No notable events documented.

## 2022-11-07 NOTE — Telephone Encounter (Signed)
Pt is currently still in the hospital.

## 2022-11-07 NOTE — Progress Notes (Signed)
Asked to see for dialysis. Pt had AVF revision and TDC placed today by [facility].  Post op pt complained of SOB, and was potentially going to have an extra OP HD today due to vol overload which was apparent after his last HD yesterday.  Pt seen in PACU, mild crackles, mild ^wob, trace edema bilat LE's. Not in distress.  Will plan extra HD inpatient today this afternoon. He should feel better after dialysis. Pt is not admitted. If pt needs admission will do formal consult.     TTS NW  4h  400/ 1.5  73.5kg  2/2 bath  AVF (revised today)/ new TDC (placed today)  Heparin 1200   Rob Verline Kong  MD  [facility] 11/07/2022, 2:14 PM  Recent Labs  Lab 11/07/22 0614  HGB 9.9*  CREATININE 10.50*  K 4.6    Inpatient medications:  chlorhexidine  60 mL Topical Once   And   [START ON 11/08/2022] chlorhexidine  60 mL Topical Once   oxyCODONE        sodium chloride     sodium chloride     acetaminophen     acetaminophen, acetaminophen **OR** acetaminophen (TYLENOL) oral liquid 160 mg/5 mL, fentaNYL (SUBLIMAZE) injection, oxyCODONE

## 2022-11-07 NOTE — Progress Notes (Signed)
Pt receives out-pt HD at [facility] [place] on TTS. Pt arrives at 6:40 am for 7:00 am. Will assist as needed.   Olivia Canter Renal Navigator [phone]

## 2022-11-07 NOTE — Progress Notes (Signed)
Received patient in bed to unit.  Alert and oriented.  Informed consent signed and in chart.   TX duration:3.5 hours  Patient tolerated well.  Transported back to the PACU to be discharged. Alert, without acute distress.  Hand-off given to patient's nurse.   Access used: none Access issues: catheter  Total UF removed: 3500 Medication(s) given: heparin 1500 units bolus Post HD VS: 181/104 Post HD weight: unable to obtain     11/07/22 2006  Vitals  Temp 98.4 F (36.9 C)  Temp Source Oral  BP (!) 186/104  MAP (mmHg) 127  BP Location Left Arm  BP Method Automatic  Patient Position (if appropriate) Lying  Pulse Rate (!) 105  Pulse Rate Source Monitor  ECG Heart Rate (!) 106  Resp (!) 23  Oxygen Therapy  SpO2 100 %  O2 Device Nasal Cannula  Patient Activity (if Appropriate) In bed  Pulse Oximetry Type Continuous  During Treatment Monitoring  Intra-Hemodialysis Comments See progress note (post rinseback)  Post Treatment  Dialyzer Clearance Lightly streaked  Duration of HD Treatment -hour(s) 3.5 hour(s)  Hemodialysis Intake (mL) 0 mL  Liters Processed 84  Fluid Removed (mL) 3500 mL  Tolerated HD Treatment Yes  Post-Hemodialysis Comments HD tx achieved as expected, tolerated well.  AVG/AVF Arterial Site Held (minutes) 0 minutes  AVG/AVF Venous Site Held (minutes) 0 minutes  Note  Observations pt is alert, oriented, verbally responsive, pt is stable.  Fistula / Graft Right Upper arm Arteriovenous fistula  Placement Date/Time: 04/12/19 1105   Orientation: (c) Right  Access Location: Upper arm  Access Type: (c) Arteriovenous fistula  Site Condition No complications  Fistula / Graft Assessment Present  Status Deaccessed  Drainage Description None  Hemodialysis Catheter Right Internal jugular Double lumen Permanent (Tunneled)  Placement Date/Time: 11/07/22 0816   Serial / Lot #: [id number]  Time Out: Correct patient;Correct site;Correct procedure  Maximum sterile barrier  precautions: Hand hygiene;Cap;Mask;Sterile gown;Sterile gloves;Large sterile sheet  Site Prep: Chlorhexid...  Site Condition No complications  Blue Lumen Status Flushed;Heparin locked;Dead end cap in place  Red Lumen Status Flushed;Heparin locked;Dead end cap in place  Purple Lumen Status N/A  Catheter fill solution Heparin 1000 units/ml  Catheter fill volume (Arterial) 1.6 cc  Catheter fill volume (Venous) 1.6  Dressing Type Gauze/Drain sponge  Post treatment catheter status Capped and Clamped

## 2022-11-07 NOTE — Progress Notes (Incomplete)
   11/07/22 2141  Vitals  Temp 98.2 F (36.8 C)  Temp Source Oral  BP (!) 190/111  MAP (mmHg) 134  BP Location Left Arm  BP Method Automatic  Patient Position (if appropriate) Lying  Pulse Rate Source Monitor  Level of Consciousness  Level of Consciousness Alert  MEWS COLOR  MEWS Score Color Yellow  Oxygen Therapy  SpO2 100 %  O2 Device Room Air  MEWS Score  MEWS Temp 0  MEWS Systolic 0  MEWS Pulse 1  MEWS RR 1  MEWS LOC 0  MEWS Score 2   Pt admitted to [facility] from PACU. Pt oriented to unit. Pt agitated, anxious and crying. Pt difficult to calm down. Pt screaming, cussing and yelling at RN. Pt reported pain level 10/10. Pt Bp elevated. Bradham,Vance MD was notified. Pain and Bp medication administered. Pt refused assessment and Telemetry. Pt refused CHG bath. Pt educated on the importance of assessment, telemetry and CHG bath. Charge nurse notified.  2255 Pt complained of chest pain Dr. Aldona Bar notified. EKG performed. Pain medication administered.  2300 Pt's partner at bedside and able to calm Pt. Pt's partner encouraged pt to be placed on telemetry. Pt currently on telemetry. CCMD called. Ordered medications given

## 2022-11-07 NOTE — Progress Notes (Signed)
VASCULAR SURGERY:  The patient notes some shortness of breath.  His chest x-ray looks a little wet.  I have spoken to Dr. Arlean Hopping.  He will get dialyzed today in the hospital.  Depending upon how late it is he may have to stay overnight.  He is normally a Tuesday Thursday Saturday dialysis at [facility].  Cari Caraway, MD 2:04 PM

## 2022-11-07 NOTE — Telephone Encounter (Signed)
-----   Message from Chuck Hint, MD sent at 11/07/2022 11:37 AM EDT ----- Regarding: charge  PROCEDURE:   Ultrasound-guided placement of right IJ tunneled dialysis catheter (19 cm) Excision of large aneurysmal right upper arm fistula with revision using 7 mm PTFE graft  SURGEON: Di Kindle. Edilia Bo, MD  ASSIST: Deniece Ree, RNFA  He will need a follow-up visit in 2 weeks on the PA schedule.  Thank you.  CD

## 2022-11-07 NOTE — Procedures (Signed)
I was present at this dialysis session, have reviewed the session and made  appropriate changes Vinson Moselle MD  [facility] 11/07/2022, 4:13 PM

## 2022-11-08 ENCOUNTER — Inpatient Hospital Stay (HOSPITAL_COMMUNITY): Payer: Medicare Other

## 2022-11-08 DIAGNOSIS — D539 Nutritional anemia, unspecified: Secondary | ICD-10-CM | POA: Insufficient documentation

## 2022-11-08 DIAGNOSIS — D649 Anemia, unspecified: Secondary | ICD-10-CM | POA: Insufficient documentation

## 2022-11-08 LAB — RENAL FUNCTION PANEL
Albumin: 3.2 g/dL — ABNORMAL LOW (ref 3.5–5.0)
Anion gap: 19 — ABNORMAL HIGH (ref 5–15)
BUN: 46 mg/dL — ABNORMAL HIGH (ref 6–20)
CO2: 25 mmol/L (ref 22–32)
Calcium: 9.7 mg/dL (ref 8.9–10.3)
Chloride: 93 mmol/L — ABNORMAL LOW (ref 98–111)
Creatinine, Ser: 8.82 mg/dL — ABNORMAL HIGH (ref 0.61–1.24)
GFR, Estimated: 7 mL/min — ABNORMAL LOW (ref >60)
Glucose, Bld: 83 mg/dL (ref 70–99)
Phosphorus: 7.1 mg/dL — ABNORMAL HIGH (ref 2.5–4.6)
Potassium: 3.9 mmol/L (ref 3.5–5.1)
Sodium: 137 mmol/L (ref 135–145)

## 2022-11-08 LAB — CBC
HCT: 24.7 % — ABNORMAL LOW (ref 39.0–52.0)
Hemoglobin: 7.9 g/dL — ABNORMAL LOW (ref 13.0–17.0)
MCH: 31.3 pg (ref 26.0–34.0)
MCHC: 32 g/dL (ref 30.0–36.0)
MCV: 98 fL (ref 80.0–100.0)
Platelets: 209 10*3/uL (ref 150–400)
RBC: 2.52 MIL/uL — ABNORMAL LOW (ref 4.22–5.81)
RDW: 16.9 % — ABNORMAL HIGH (ref 11.5–15.5)
WBC: 7.9 10*3/uL (ref 4.0–10.5)
nRBC: 0 % (ref 0.0–0.2)

## 2022-11-08 LAB — CK TOTAL AND CKMB (NOT AT ARMC)
CK, MB: 2.2 ng/mL (ref 0.5–5.0)
Total CK: 422 U/L — ABNORMAL HIGH (ref 49–397)

## 2022-11-08 MED ORDER — CLONIDINE HCL 0.1 MG PO TABS: 0.1000 mg | ORAL_TABLET | Freq: Two times a day (BID) | ORAL | Status: DC

## 2022-11-08 MED ORDER — HYDRALAZINE HCL 20 MG/ML IJ SOLN
INTRAMUSCULAR | Status: AC
  Administered 2022-11-08: 10 mg via INTRAVENOUS
  Filled 2022-11-08: qty 1

## 2022-11-08 MED ORDER — HEPARIN SODIUM (PORCINE) 1000 UNIT/ML DIALYSIS
1500.0000 [IU] | INTRAMUSCULAR | Status: DC | PRN
  Administered 2022-11-08: 1500 [IU] via INTRAVENOUS_CENTRAL
  Filled 2022-11-08: qty 2

## 2022-11-08 MED ORDER — HYDRALAZINE HCL 20 MG/ML IJ SOLN
10.0000 mg | Freq: Once | INTRAMUSCULAR | Status: DC
  Filled 2022-11-08: qty 1

## 2022-11-08 MED ORDER — HEPARIN SODIUM (PORCINE) 1000 UNIT/ML DIALYSIS
1000.0000 [IU] | INTRAMUSCULAR | Status: DC | PRN
  Administered 2022-11-08: 1000 [IU]

## 2022-11-08 MED ORDER — CLONIDINE HCL 0.1 MG PO TABS: 0.2000 mg | ORAL_TABLET | Freq: Three times a day (TID) | ORAL | Status: DC

## 2022-11-08 MED ORDER — CHLORHEXIDINE GLUCONATE CLOTH 2 % EX PADS
6.0000 | MEDICATED_PAD | Freq: Every day | CUTANEOUS | Status: DC
  Administered 2022-11-08 – 2022-11-13 (×2): 6 via TOPICAL

## 2022-11-08 MED ORDER — LOSARTAN POTASSIUM 50 MG PO TABS: 50.0000 mg | ORAL_TABLET | Freq: Every day | ORAL | Status: DC

## 2022-11-08 MED ORDER — HYDRALAZINE HCL 20 MG/ML IJ SOLN: 10.0000 mg | Freq: Once | INTRAMUSCULAR | Status: AC

## 2022-11-08 MED ORDER — SODIUM CHLORIDE 0.9 % IV BOLUS: 250.0000 mL | Freq: Once | INTRAVENOUS | Status: DC

## 2022-11-08 MED ORDER — LOSARTAN POTASSIUM 50 MG PO TABS: 100.0000 mg | ORAL_TABLET | Freq: Every day | ORAL | Status: DC

## 2022-11-08 MED ORDER — ALTEPLASE 2 MG IJ SOLR: 2.0000 mg | Freq: Once | INTRAMUSCULAR | Status: DC | PRN

## 2022-11-08 MED ORDER — LOSARTAN POTASSIUM 50 MG PO TABS
50.0000 mg | ORAL_TABLET | Freq: Every day | ORAL | Status: DC
  Administered 2022-11-08 – 2022-11-09 (×2): 50 mg via ORAL
  Filled 2022-11-08 (×3): qty 1

## 2022-11-08 MED ORDER — ANTICOAGULANT SODIUM CITRATE 4% (200MG/5ML) IV SOLN: 5.0000 mL | Status: DC | PRN

## 2022-11-08 MED ORDER — CLONIDINE HCL 0.1 MG PO TABS
0.3000 mg | ORAL_TABLET | Freq: Three times a day (TID) | ORAL | Status: DC
  Administered 2022-11-08: 0.3 mg via ORAL
  Filled 2022-11-08: qty 3

## 2022-11-08 NOTE — Assessment & Plan Note (Addendum)
Baseline BP 150-200/100-140. Home medications include hydralazine 100 mg TID, losartan 50 mg daily, clonidine 0.1 mg BID and amlodipine 10 mg daily.   Refused midday dose of oral hydralazine and evening dose of prn hydral.  Had HD today with no improvement in blood pressures.  Appears euvolemic on exam. BP in 2-teens-220's/1 teens-120s currently. -Continue home amlodipine 10 mg daily, hydralazine 100 mg TID -Increase clonidine to 0.3 mg TID -Additional dose of losartan 50 mg tonight, consider increasing to 100 mg tomorrow if needed -Zofran for nausea.  If vomiting continues and patient unable to take p.o., will consider head CT and IV metoprolol

## 2022-11-08 NOTE — Assessment & Plan Note (Addendum)
Hgb 9.9>7.9>7.7 s/p surgery. Transfusion threshold <7. Of note dose have hx of lower GI bleed, previous colonoscopy showing diverticulosis. No blood per rectum during hospitalization per pt.  -recheck CBC out pt at family medicine clinic  -Consider iron studies

## 2022-11-08 NOTE — Progress Notes (Signed)
Pt seen in room this am. Got HD last night w/ 3.5 L off. Breathing much better and lungs are clear. This am is still having a lot of post-op arm pain, and BP's are up. Since not looking like he will be dc'd now, will plan dialysis here this afternoon as this is his usual day and he still has volume on.   If pt is formally admitted will do full consult.     TTS NW  4h  400/ 1.5  73.5kg  2/2 bath  AVF (revised today)/ new TDC (placed today)  Heparin 1200   Rob Shavonta Gossen  MD  [facility] 11/08/2022, 10:56 AM  Recent Labs  Lab 11/07/22 0614  HGB 9.9*  CREATININE 10.50*  K 4.6     Inpatient medications:  amLODipine  10 mg Oral Daily   calcium acetate  667 mg Oral TID WC   Chlorhexidine Gluconate Cloth  6 each Topical Q0600   cloNIDine  0.1 mg Oral BID   divalproex  500 mg Oral QHS   ferric citrate  210 mg Oral TID WC   hydrALAZINE  100 mg Oral TID   losartan  50 mg Oral Daily   pantoprazole  40 mg Oral BID    sodium chloride     acetaminophen, alum & mag hydroxide-simeth, guaiFENesin-dextromethorphan, hydrALAZINE, morphine injection, ondansetron, oxyCODONE-acetaminophen, oxyCODONE-acetaminophen, phenol, SUMAtriptan, zolpidem

## 2022-11-08 NOTE — Care Management Obs Status (Signed)
MEDICARE OBSERVATION STATUS NOTIFICATION   Patient Details  Name: APURVA BLAYNEY MRN: [id number] Date of Birth: [birth year]   Medicare Observation Status Notification Given:  Yes    Isaias Cowman, RN 11/08/2022, 11:55 AM

## 2022-11-08 NOTE — Assessment & Plan Note (Addendum)
S/p [facility] placement and excision of right arm aneurysmal fistula with revision with VVS on 6/28.  Nephrology managing HD which he had today. Has HD TTS. -Continue plans per VVS and nephrology

## 2022-11-08 NOTE — Care Management CC44 (Signed)
Condition Code 44 Documentation Completed  Patient Details  Name: [name] MRN: [id number] Date of Birth: November 10, 1985   Condition Code 44 given:  Yes Patient signature on Condition Code 44 notice:  Yes Documentation of 2 MD's agreement:  Yes Code 44 added to claim:  Yes    Isaias Cowman, RN 11/08/2022, 11:55 AM

## 2022-11-08 NOTE — Consult Note (Cosign Needed Addendum)
Consult Note Service Pager: [phone]  Patient name: [name] Medical record number: [id number] Date of Birth: [birth year] Age: 37 y.o. Gender: male  Primary Care Provider: Sabino Dick, DO Code Status: Full Preferred Emergency Contact:  Contact Information     Name Relation Home Work Mobile   [facility] Spouse [phone]     [name] [phone]  [phone]   [name] Sister   [phone]   Theodric, Garbo   [phone]   [name],[name] Father   [phone]        Chief Complaint: Aneurysm of AV fistula  Assessment and Plan: [name] is a 37 y.o. male admitted by VVS, for TCD placement and excision of aneurysmal fistula with revision which was completed on 6/28.  After HD on 6/29 BP became elevated in 200s/100s and started vomiting in setting of migraine and PO for first time since being NPO for surgery. Refusal of hydralazine today likely contributing to elevated BP. Does not appear encephalopathic and neuro exam reassuring. Low concern for endorgan damage at this time.    Hospital Problem List      Hospital     * (Principal) A-V fistula [facility])     Severe uncontrolled hypertension     Baseline BP 150-200/100-140. Home medications include hydralazine 100  mg TID, losartan 50 mg daily, clonidine 0.1 mg BID and amlodipine 10 mg  daily.   Refused midday dose of oral hydralazine and evening dose of prn hydral.   Had HD today with no improvement in blood pressures.  Appears euvolemic on  exam. BP in 2-teens-220's/1 teens-120s currently. -Continue home amlodipine 10 mg daily, hydralazine 100 mg TID -Increase clonidine to 0.3 mg TID -Additional dose of losartan 50 mg tonight, consider increasing to 100 mg  tomorrow if needed -Zofran for nausea.  If vomiting continues and patient unable to take  p.o., will consider head CT and IV metoprolol         ESRD (end stage renal disease) (HCC)     S/p TDC placement and excision  of right arm aneurysmal fistula with  revision with VVS on 6/28.  Nephrology managing HD which he had today. Has  HD TTS. -Continue plans per VVS and nephrology         Migraines     Neck pain and occipital pain consistent with chronic migraines.   Neuroexam reassuring.  Had a head CT on 5/14 showing no intracranial  pathology.  Vomiting could be d/t migraines but low threshold to order  head CT if vomiting continues despite antiemetic or if other symptoms  develop such as changes in vision.  Has not received prn sumatriptan since  early hours this morning. -Continue Tylenol -Continue sumatriptan prn -Continue home Depakote        Aneurysm of arteriovenous fistula (HCC)     Anemia     Hgb 9.9>7.9 s/p surgery. Transfusion threshold <7. Of note dose have hx  of lower GI bleed, previous colonoscopy showing diverticulosis. No blood  per rectum during hospitalization per pt.  -am CBC         FEN/GI: Renal diet  VTE Prophylaxis: SCDs  Disposition: Home pending continued medical management   History of Present Illness:  [name] is a 37 y.o. male admitted under the care of VVS and is s/p [facility] placement and excision of right arm aneurysmal fistula with revision on 6/28. Family Medicine consulted to help with blood pressure management. No changes in vision, no current chest pain,  no abdominal pain, no shortness of breath.  Patient complains of posterior cervical neck pain and occipital pain which is consistent with his chronic migraines.  States he typically does not vomit with his migraines. Started vomiting after eating dinner this evening.  Feels very out of it, closes his eyes intermittently during exam but quickly opens them and is alert and oriented.   Pertinent Past Medical History: HTN, HFmrEF, Alport syndrome, ESRD on HD TTS  Remainder reviewed in history tab.   Pertinent Past Surgical History: AV fistula placement L 2020  Remainder reviewed in history tab.    Pertinent Social History: Tobacco use: Yes/No/Former Alcohol use: Not currently Other Substance use: No Lives with self  Pertinent Family History: Mother with DM, HTN, CKD Father with heart disease Sister with HTN  Remainder reviewed in history tab.   Important Outpatient Medications: Amlodipine 10 mg, clonidine 0.1 BID, hydralazine 100 mg 3 times daily, losartan 50 mg daily, pantoprazole 40 mg daily, sumatriptan, divalproex 500 nightly Remainder reviewed in medication history.   Objective: BP (!) 180/111   Pulse (!) 124   Temp 98.4 F (36.9 C) (Oral)   Resp 15   Ht 5\' 8"  (1.727 m)   Wt 73 kg   SpO2 100%   BMI 24.47 kg/m  Exam: General: 37 year old male, mild distress due to migraine Eyes: White sclera, clear conjunctiva Neck: Supple Cardiovascular: Tachycardic, regular rhythm Respiratory: Breathing comfortably on room air, speaking in complete sentences, CTAB Gastrointestinal: Bowel sounds present, soft, nontender palpation, nondistended MSK: Good bulk and tone Derm: Warm and dry Neuro: Alert and oriented. Cranial nerves II through XII intact, sensation intact, finger-nose-finger test normal Psych: Anxious appearing-fidgety and talking fast, no pressured speech or tangential thinking  Labs:  CBC BMET  Recent Labs  Lab 11/08/22 1134  WBC 7.9  HGB 7.9*  HCT 24.7*  PLT 209   Recent Labs  Lab 11/08/22 1134  NA 137  K 3.9  CL 93*  CO2 25  BUN 46*  CREATININE 8.82*  GLUCOSE 83  CALCIUM 9.7       [name], DO 11/08/2022, 11:01 PM PGY-2, [facility] Family Medicine  FPTS Intern pager: [phone], text pages welcome Secure chat group [facility] Teaching Service

## 2022-11-08 NOTE — Discharge Instructions (Addendum)
[facility]  Discharge Instructions  AV Fistula or Graft Surgery for Dialysis Access  Please refer to the following instructions for your post-procedure care. Your surgeon or physician assistant will discuss any changes with you.  Activity  You may drive the day following your surgery, if you are comfortable and no longer taking prescription pain medication. Resume full activity as the soreness in your incision resolves.  Bathing/Showering  You may shower after you go home. Keep your incision dry for 48 hours. Do not soak in a bathtub, hot tub, or swim until the incision heals completely. You may not shower if you have a hemodialysis catheter.  Incision Care  Clean your incision with mild soap and water after 48 hours. Pat the area dry with a clean towel. You do not need a bandage unless otherwise instructed. Do not apply any ointments or creams to your incision. You may have skin glue on your incision. Do not peel it off. It will come off on its own in about one week. Your arm may swell a bit after surgery. To reduce swelling use pillows to elevate your arm so it is above your heart. Your doctor will tell you if you need to lightly wrap your arm with an ACE bandage.  Diet  Resume your normal diet. There are not special food restrictions following this procedure. In order to heal from your surgery, it is CRITICAL to get adequate nutrition. Your body requires vitamins, minerals, and protein. Vegetables are the best source of vitamins and minerals. Vegetables also provide the perfect balance of protein. Processed food has little nutritional value, so try to avoid this.  Medications  Resume taking all of your medications. If your incision is causing pain, you may take over-the counter pain relievers such as acetaminophen (Tylenol). If you were prescribed a stronger pain medication, please be aware these medications can cause nausea and constipation. Prevent  nausea by taking the medication with a snack or meal. Avoid constipation by drinking plenty of fluids and eating foods with high amount of fiber, such as fruits, vegetables, and grains.  Do not take Tylenol if you are taking prescription pain medications.  Follow up Your surgeon may want to see you in the office following your access surgery. If so, this will be arranged at the time of your surgery.  Please call us immediately for any of the following conditions:  Increased pain, redness, drainage (pus) from your incision site Fever of 101 degrees or higher Severe or worsening pain at your incision site Hand pain or numbness.  Reduce your risk of vascular disease:  Stop smoking. If you would like help, call [facility] at 1-800-QUIT-NOW (5162724577) or [place] at [phone]  Manage your cholesterol Maintain a desired weight Control your diabetes Keep your blood pressure down  Dialysis  It will take several weeks to several months for your new dialysis access to be ready for use. Your surgeon will determine when it is okay to use it. Your nephrologist will continue to direct your dialysis. You can continue to use your Permcath until your new access is ready for use.   11/08/2022 [name] [id number] 1986-02-14  Surgeon(s): Chuck Hint, MD  Procedure(s): PLICATION OF LARGE ANEURYSMS OF RIGHT ARTERIOVENOUS FISTULA WITH INSERTION OF 7mm INTERPOSITIONAL GORTEX GRAFT INSERTION OF RIGHT INTERNAL JUGULAR TUNNELED DIALYSIS CATHETER   May stick graft immediately   May stick graft on designated area only:   X Do not stick Right  AV Graft for 4 weeks    If you have any questions, please call the office at [phone].  Blood Pressure Medication Changes per Family Medicine: Continue amlodipine 10 mg daily, hydralazine 100 mg three times daily, losartan 100 mg daily.  Clonidine increased to 0.3 mg three times daily (sent to pharmacy) Add labetalol 100 mg  twice daily (sent to pharmacy). This medication is good for both blood pressure control and heart failure.  Follow up at family medicine clinic on 11/19/22   [facility] [employer] [facility] The United Way's "B3979455" is a great source of information about community services available.  Access by dialing 2-1-1 from anywhere in [place], or by website -  PooledIncome.pl.   Other Armed forces technical officer Number and Address  [facility] Housing for homeless and needy men with substance abuse issues 5 day Covid Quarantine [phone] [address] [place], [place]  [employer] of [place] Emergency assistance for [employer] only [employer] [phone] [address] [place], [place]  [employer] of the [place] Domestic violence shelter for women and their children [phone] [place], [place]  [facility] Domestic violence shelter for women and their children Each family gets their own unit and can quarantine after admission. [phone] [place], [place]  [facility]) / Resources for the [employer] center for the homeless Information and referral to housing resources Counseling [employer] La[employer]hop Phone bank Mailroom Computer lab Medical clinic Bike maintenance center 14 Day covid quarantine [phone] [address] [place], [place]  [facility] [employer] Men's [facility] Emergency housing Food Emergency financial assistance Permanent supportive housing [phone] [address] [place], [place]  [employer] Crisis assistance Medication Housing Food Utility assistance [phone] [address] [place], [place]   [phone] [address] [place], [place]  [employer] of [place]       Transitional housing Case Risk manager assistance [phone] [address] [place], [place]  [facility], [employer] for adult men and women Can admit with MD clearance from the hospital after positive covid test.  Intake Hotline [phone] [address] [place], [place]  24-hour Crisis Line for those Facing Homelessness   Information and referral to community resources [phone]  [employer] and additional resources. Can admit with MD clearance after positive covid test.  Prefer online applications (blocked on [facility] computers)/ currently full. Will be able to do intake at the office starting in May.  [phone] Admin only location     [facility]) now has a full time staff to take referrals for all individuals needing shelter or housing placement. They do not do direct services or have beds, but are in charge of assessing and coordinating placement for individuals needing shelter. The phone number for coordinated entry is [phone].
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
## 2022-11-08 NOTE — Assessment & Plan Note (Addendum)
Neck pain and occipital pain consistent with chronic migraines.  Neuroexam reassuring.  Had a head CT on 5/14 showing no intracranial pathology.  Vomiting could be d/t migraines but low threshold to order head CT if vomiting continues despite antiemetic or if other symptoms develop such as changes in vision.  Has not received prn sumatriptan since early hours this morning. -Continue Tylenol -Continue sumatriptan prn -Continue home Depakote

## 2022-11-08 NOTE — Plan of Care (Signed)
  Problem: Education: Goal: Knowledge of General Education information will improve Description: Including pain rating scale, medication(s)/side effects and non-pharmacologic comfort measures Outcome: Progressing   Problem: Clinical Measurements: Goal: Respiratory complications will improve Outcome: Progressing   Problem: Activity: Goal: Risk for activity intolerance will decrease Outcome: Progressing   

## 2022-11-08 NOTE — Progress Notes (Signed)
POST HD TX NOTE  11/08/22 1725  Vitals  Temp 98.7 F (37.1 C)  Temp Source Oral  BP (!) 192/110  MAP (mmHg) 132  BP Location Left Arm  BP Method Automatic  Patient Position (if appropriate) Lying  Pulse Rate (!) 124  Pulse Rate Source Monitor  ECG Heart Rate (!) 126  Resp (!) 42  Oxygen Therapy  SpO2 100 %  O2 Device Room Air  Pulse Oximetry Type Continuous  During Treatment Monitoring  Intra-Hemodialysis Comments (S)   (post HD tx VS check)  Post Treatment  Dialyzer Clearance Lightly streaked  Duration of HD Treatment -hour(s) 3.5 hour(s)  Hemodialysis Intake (mL) 0 mL  Liters Processed 83.9  Fluid Removed (mL) 2500 mL  Tolerated HD Treatment (S)  No (Comment) (pt c/o pain throughout tx, his bp was high throughout tx despite multiple interventions)  Post-Hemodialysis Comments (S)  tx completed w/ very high bp throughout tx despite multiple interventions to get it down. pt also c/o pain throughout tx despite interventions. pt had an 8 beat run of Vtach which he felt and asked what happened b/c he felt something that he had never felt before. MD was called and made aware and orders were received to decrease UF goal to 2-2.5L at that time. UF goal met, blood rinsed back, VSS although bp still very high. after the second dose of hydralazine pt began to vomit and HR went up while vomitting but bp never came down. Medication Admin: Heparin 1500 units pre tx bolus, Hydralazine 10mg  IVP x2, Morphine 4mg  IVP, Zofran 4mg  IVP, Heparin Dwells 3200 unjits  Hemodialysis Catheter Right Internal jugular Double lumen Permanent (Tunneled)  Placement Date/Time: 11/07/22 0816   Serial / Lot #: [id number]  Time Out: Correct patient;Correct site;Correct procedure  Maximum sterile barrier precautions: Hand hygiene;Cap;Mask;Sterile gown;Sterile gloves;Large sterile sheet  Site Prep: Chlorhexid...  Site Condition No complications  Blue Lumen Status Heparin locked;Dead end cap in place  Red Lumen Status  Heparin locked;Dead end cap in place  Purple Lumen Status N/A  Catheter fill solution Heparin 1000 units/ml  Catheter fill volume (Arterial) 1.6 cc  Catheter fill volume (Venous) 1.6  Dressing Type Transparent  Dressing Status Antimicrobial disc in place;Clean, Dry, Intact  Drainage Description None  Dressing Change Due 11/15/22  Post treatment catheter status Capped and Clamped

## 2022-11-08 NOTE — Progress Notes (Deleted)
Dr. Cliffton Asters with [facility] says that there was still a lot of stool in him, and it is perfectly normal that this is still happening.

## 2022-11-09 ENCOUNTER — Encounter (HOSPITAL_COMMUNITY): Payer: Self-pay | Admitting: Vascular Surgery

## 2022-11-09 DIAGNOSIS — I169 Hypertensive crisis, unspecified: Secondary | ICD-10-CM

## 2022-11-09 DIAGNOSIS — Y712 Prosthetic and other implants, materials and accessory cardiovascular devices associated with adverse incidents: Secondary | ICD-10-CM | POA: Diagnosis present

## 2022-11-09 DIAGNOSIS — Z841 Family history of disorders of kidney and ureter: Secondary | ICD-10-CM | POA: Diagnosis not present

## 2022-11-09 DIAGNOSIS — K2980 Duodenitis without bleeding: Secondary | ICD-10-CM | POA: Diagnosis not present

## 2022-11-09 DIAGNOSIS — E162 Hypoglycemia, unspecified: Secondary | ICD-10-CM | POA: Diagnosis not present

## 2022-11-09 DIAGNOSIS — F319 Bipolar disorder, unspecified: Secondary | ICD-10-CM | POA: Diagnosis not present

## 2022-11-09 DIAGNOSIS — D539 Nutritional anemia, unspecified: Secondary | ICD-10-CM | POA: Diagnosis not present

## 2022-11-09 DIAGNOSIS — I5032 Chronic diastolic (congestive) heart failure: Secondary | ICD-10-CM

## 2022-11-09 DIAGNOSIS — K449 Diaphragmatic hernia without obstruction or gangrene: Secondary | ICD-10-CM | POA: Diagnosis not present

## 2022-11-09 DIAGNOSIS — F1223 Cannabis dependence with withdrawal: Secondary | ICD-10-CM | POA: Diagnosis not present

## 2022-11-09 DIAGNOSIS — I721 Aneurysm of artery of upper extremity: Secondary | ICD-10-CM | POA: Diagnosis not present

## 2022-11-09 DIAGNOSIS — Z8249 Family history of ischemic heart disease and other diseases of the circulatory system: Secondary | ICD-10-CM | POA: Diagnosis not present

## 2022-11-09 DIAGNOSIS — I132 Hypertensive heart and chronic kidney disease with heart failure and with stage 5 chronic kidney disease, or end stage renal disease: Secondary | ICD-10-CM | POA: Diagnosis not present

## 2022-11-09 DIAGNOSIS — F4325 Adjustment disorder with mixed disturbance of emotions and conduct: Secondary | ICD-10-CM | POA: Diagnosis not present

## 2022-11-09 DIAGNOSIS — I770 Arteriovenous fistula, acquired: Secondary | ICD-10-CM | POA: Diagnosis not present

## 2022-11-09 DIAGNOSIS — Q8781 Alport syndrome: Secondary | ICD-10-CM | POA: Diagnosis not present

## 2022-11-09 DIAGNOSIS — R1310 Dysphagia, unspecified: Secondary | ICD-10-CM | POA: Diagnosis not present

## 2022-11-09 DIAGNOSIS — I1A0 Resistant hypertension: Secondary | ICD-10-CM | POA: Diagnosis not present

## 2022-11-09 DIAGNOSIS — K222 Esophageal obstruction: Secondary | ICD-10-CM | POA: Diagnosis not present

## 2022-11-09 DIAGNOSIS — Z5900 Homelessness unspecified: Secondary | ICD-10-CM | POA: Diagnosis not present

## 2022-11-09 DIAGNOSIS — K5900 Constipation, unspecified: Secondary | ICD-10-CM | POA: Diagnosis not present

## 2022-11-09 DIAGNOSIS — R519 Headache, unspecified: Secondary | ICD-10-CM | POA: Diagnosis not present

## 2022-11-09 DIAGNOSIS — R0789 Other chest pain: Secondary | ICD-10-CM | POA: Diagnosis not present

## 2022-11-09 DIAGNOSIS — K2289 Other specified disease of esophagus: Secondary | ICD-10-CM | POA: Diagnosis not present

## 2022-11-09 DIAGNOSIS — I5042 Chronic combined systolic (congestive) and diastolic (congestive) heart failure: Secondary | ICD-10-CM | POA: Diagnosis not present

## 2022-11-09 DIAGNOSIS — F4381 Prolonged grief disorder: Secondary | ICD-10-CM | POA: Diagnosis present

## 2022-11-09 DIAGNOSIS — D631 Anemia in chronic kidney disease: Secondary | ICD-10-CM | POA: Diagnosis not present

## 2022-11-09 DIAGNOSIS — K2950 Unspecified chronic gastritis without bleeding: Secondary | ICD-10-CM | POA: Diagnosis not present

## 2022-11-09 DIAGNOSIS — Z888 Allergy status to other drugs, medicaments and biological substances status: Secondary | ICD-10-CM | POA: Diagnosis not present

## 2022-11-09 DIAGNOSIS — F17210 Nicotine dependence, cigarettes, uncomplicated: Secondary | ICD-10-CM | POA: Diagnosis present

## 2022-11-09 DIAGNOSIS — R051 Acute cough: Secondary | ICD-10-CM | POA: Diagnosis not present

## 2022-11-09 DIAGNOSIS — Z823 Family history of stroke: Secondary | ICD-10-CM | POA: Diagnosis not present

## 2022-11-09 DIAGNOSIS — Z79899 Other long term (current) drug therapy: Secondary | ICD-10-CM | POA: Diagnosis not present

## 2022-11-09 DIAGNOSIS — Z992 Dependence on renal dialysis: Secondary | ICD-10-CM | POA: Diagnosis not present

## 2022-11-09 DIAGNOSIS — T82510A Breakdown (mechanical) of surgically created arteriovenous fistula, initial encounter: Secondary | ICD-10-CM | POA: Diagnosis not present

## 2022-11-09 DIAGNOSIS — N186 End stage renal disease: Secondary | ICD-10-CM | POA: Diagnosis not present

## 2022-11-09 DIAGNOSIS — N2581 Secondary hyperparathyroidism of renal origin: Secondary | ICD-10-CM | POA: Diagnosis not present

## 2022-11-09 LAB — CBC
HCT: 24.4 % — ABNORMAL LOW (ref 39.0–52.0)
Hemoglobin: 7.7 g/dL — ABNORMAL LOW (ref 13.0–17.0)
MCH: 31.7 pg (ref 26.0–34.0)
MCHC: 31.6 g/dL (ref 30.0–36.0)
MCV: 100.4 fL — ABNORMAL HIGH (ref 80.0–100.0)
Platelets: 213 10*3/uL (ref 150–400)
RBC: 2.43 MIL/uL — ABNORMAL LOW (ref 4.22–5.81)
RDW: 16.8 % — ABNORMAL HIGH (ref 11.5–15.5)
WBC: 7.3 10*3/uL (ref 4.0–10.5)
nRBC: 0 % (ref 0.0–0.2)

## 2022-11-09 MED ORDER — METOPROLOL TARTRATE 12.5 MG HALF TABLET
12.5000 mg | ORAL_TABLET | Freq: Two times a day (BID) | ORAL | Status: DC
  Administered 2022-11-09 – 2022-11-10 (×4): 12.5 mg via ORAL
  Filled 2022-11-09 (×4): qty 1

## 2022-11-09 MED ORDER — CLONIDINE HCL 0.1 MG PO TABS: 0.2000 mg | ORAL_TABLET | Freq: Three times a day (TID) | ORAL | Status: DC

## 2022-11-09 MED ORDER — CLONIDINE HCL 0.2 MG PO TABS: 0.2000 mg | ORAL_TABLET | Freq: Two times a day (BID) | ORAL | 11 refills | Status: DC

## 2022-11-09 MED ORDER — METOPROLOL TARTRATE 25 MG PO TABS: 12.5000 mg | ORAL_TABLET | Freq: Two times a day (BID) | ORAL | 2 refills | Status: DC

## 2022-11-09 MED ORDER — OXYCODONE-ACETAMINOPHEN 5-325 MG PO TABS: 1.0000 | ORAL_TABLET | Freq: Four times a day (QID) | ORAL | 0 refills | Status: DC | PRN

## 2022-11-09 MED ORDER — CLONIDINE HCL 0.1 MG PO TABS
0.2000 mg | ORAL_TABLET | Freq: Two times a day (BID) | ORAL | Status: DC
  Administered 2022-11-09 (×2): 0.2 mg via ORAL
  Filled 2022-11-09 (×3): qty 2

## 2022-11-09 NOTE — Progress Notes (Signed)
Daily Progress Note Intern Pager: [phone]  Patient name: [name] Medical record number: [id number] Date of birth: 1986/04/13 Age: 37 y.o. Gender: male  Primary Care Provider: Sabino Dick, DO Admitting:VVS Code Status: Full   Assessment and Plan: [name] is a 37 y.o. male admitted by VVS, for TCD placement and excision of aneurysmal fistula with revision which was completed on 6/28.  After HD on 6/29 BP became elevated in 200s/100s and FMTS consulted for blood pressure management  PMHx significant for HTN, HFmrEF, Alport syndrome, ESRD on HD TTS    Hospital Problem List      Hospital     * (Principal) A-V fistula (HCC)     Severe uncontrolled hypertension     Baseline BP 150-200/100-140. BP improved ON with increased clonidine  and additional dose of losartan in addition to hydralazine 100 mg (home  dose). Would benefit from b-blocker for both HTN and HF.  -Continue home amlodipine 10 mg daily, hydralazine 100 mg TID, and  losartan 50 mg daily -Increase clonidine to 0.2 mg BID ( prepped to send to pharmacy at d/c) -Add lopressor 12.5 mg BID (prepped to send to pharmacy at d/c) -Okay to discharge from family medicine perspective  -apt made for BP f/u at family medicine clinic on 11/11/22         ESRD (end stage renal disease) (HCC)     S/p [facility] placement and excision of right arm aneurysmal fistula with  revision with VVS on 6/28.  Nephrology managing HD which he had today. Has  HD TTS. -Continue plans per VVS and nephrology         Heart failure with mildly reduced ejection fraction (HFmrEF) (HCC)     Echo 06/03/22 w/ EF 55%. CXR 6/29 showing cardiomegaly and vascular  congestion. Pt tachycardic. Prefers to try metoprolol as he previously  tried coreg and had side effects (numbness of mouth). -lopressor 12.5 mg BID -Recommend f/u with cardiology out patient         Migraines     Migraine and vomiting both resolved ON with prn  sumatriptan. -Continue Tylenol -Continue sumatriptan prn -Continue home Depakote        Aneurysm of arteriovenous fistula (HCC)     Anemia     Hgb 9.9>7.9>7.7 s/p surgery. Transfusion threshold <7. Of note dose  have hx of lower GI bleed, previous colonoscopy showing diverticulosis. No  blood per rectum during hospitalization per pt.  -recheck CBC out pt at family medicine clinic  -Consider iron studies       FEN/GI: Renal diet  PPx: SCDs Dispo: Discharge home per VVS  Subjective:  Pt sleeping soundly but easily woken. Smiling and says his headache is completely gone, denies nausea, no vomiting since last night.  Says abdomen feels little crampy and sore from vomiting last night.  Objective: Temp:  [98.3 F (36.8 C)-99.1 F (37.3 C)] 98.4 F (36.9 C) (06/30 0721) Pulse Rate:  [101-124] 111 (06/30 0721) Resp:  [12-25] 20 (06/30 0721) BP: (172-217)/(101-128) 193/120 (06/30 0721) SpO2:  [97 %-100 %] 100 % (06/29 2016) Weight:  [73 kg-75.4 kg] 73 kg (06/29 1725) Physical Exam: General: 37 year old male, pleasant to speak with, NAD Cardiovascular: Slightly tachycardic, regular rhythm Respiratory: Breathing comfortably on room air, CTAB Abdomen: Bowel sounds present, soft, mild diffuse tenderness to palpation without guarding, nondistended Extremities: No edema BLEs  Laboratory: Most recent CBC Lab Results  Component Value Date   WBC 7.3 11/09/2022  HGB 7.7 (L) 11/09/2022   HCT 24.4 (L) 11/09/2022   MCV 100.4 (H) 11/09/2022   PLT 213 11/09/2022   Most recent BMP    Latest Ref Rng & Units 11/08/2022   11:34 AM  BMP  Glucose 70 - 99 mg/dL 83   BUN 6 - 20 mg/dL 46   Creatinine 1.91 - 1.24 mg/dL 4.78   Sodium 295 - 621 mmol/L 137   Potassium 3.5 - 5.1 mmol/L 3.9   Chloride 98 - 111 mmol/L 93   CO2 22 - 32 mmol/L 25   Calcium 8.9 - 10.3 mg/dL 9.7      [name], DO 11/09/2022, 7:34 AM  PGY-2, [facility] FPTS Intern pager: [phone], text  pages welcome Secure chat group [facility] Teaching Service

## 2022-11-09 NOTE — Assessment & Plan Note (Addendum)
Echo 06/03/22 w/ EF 55%. CXR 6/29 showing cardiomegaly and vascular congestion. Pt tachycardic. Prefers to try metoprolol as he previously tried coreg and had side effects (numbness of mouth). -lopressor 12.5 mg BID -Recommend f/u with cardiology out patient

## 2022-11-09 NOTE — Consult Note (Signed)
Renal Service Consult Note [facility]  [name] 11/09/2022 Edward Krabbe, MD Requesting Physician: Dr. Myra [name]  Reason for Consult: ESRD pt sp access revision HPI: The patient is a 37 y.o. year-old w/ PMH as below who presented to [facility] for elective surgery for his aneurysmal RUE AV fistula. Pt underwent surgery on 6/28. Post-op he c/o SOB and CXR showed pulm edema. We were asked to see for dialysis. Pt did dialysis that evening w/ 3.5 L off. Yesterday 6/29 he had a lot of post op pain issues and he also got his usual HD (TTS) with another 3 L off. Today his pain is better but still he feels restricted in his movement and is concerned about going home. Pt is now full inpatient admission so were are doing a formal consultation.   Looks much better today. Calm, R arm resting on a pillow. Numbness and headaches are better. BP's labile still.     ROS - denies CP, no joint pain, no HA, no blurry vision, no rash, no diarrhea, no nausea/ vomiting, no dysuria, no difficulty voiding   Past Medical History  Past Medical History:  Diagnosis Date   Bipolar 1 disorder (HCC)    Depression    ESRD on hemodialysis (HCC)    HD at [facility] on TTS schedule   GERD (gastroesophageal reflux disease)    Hearing difficulty of left ear    75% hearing   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   Hypertension    Low blood sugar    Past Surgical History  Past Surgical History:  Procedure Laterality Date   A/V FISTULAGRAM Right 10/27/2022   Procedure: A/V Fistulagram;  Surgeon: Edward Hint, MD;  Location: [facility] INVASIVE CV LAB;  Service: Cardiovascular;  Laterality: Right;   APPENDECTOMY     AV FISTULA PLACEMENT Left 03/03/2019   Procedure: ARTERIOVENOUS (AV) FISTULA CREATION LEFT ARM;  Surgeon: Edward Harman, MD;  Location: [facility] OR;  Service: Vascular;  Laterality: Left;   BASCILIC VEIN TRANSPOSITION Right 04/12/2019   Procedure: RIGHT UPPER EXTREMITY BASCILIC VEIN  TRANSPOSITION FIRST STAGE FISTULA;  Surgeon: Edward Hint, MD;  Location: [facility] OR;  Service: Vascular;  Laterality: Right;   BIOPSY  10/14/2021   Procedure: BIOPSY;  Surgeon: Edward Rist, MD;  Location: [facility] ENDOSCOPY;  Service: Gastroenterology;;   BIOPSY  06/06/2022   Procedure: BIOPSY;  Surgeon: Edward Form, MD;  Location: [facility] ENDOSCOPY;  Service: Gastroenterology;;   COLONOSCOPY WITH PROPOFOL N/A 10/14/2021   Procedure: COLONOSCOPY WITH PROPOFOL;  Surgeon: Edward Rist, MD;  Location: [facility] ENDOSCOPY;  Service: Gastroenterology;  Laterality: N/A;   ESOPHAGOGASTRODUODENOSCOPY (EGD) WITH PROPOFOL N/A 06/06/2022   Procedure: ESOPHAGOGASTRODUODENOSCOPY (EGD) WITH PROPOFOL;  Surgeon: Edward Form, MD;  Location: [facility] ENDOSCOPY;  Service: Gastroenterology;  Laterality: N/A;   FISTULA SUPERFICIALIZATION Right 11/07/2022   Procedure: PLICATION OF LARGE ANEURYSMS OF RIGHT ARTERIOVENOUS FISTULA WITH INSERTION OF 7mm INTERPOSITIONAL GORTEX GRAFT;  Surgeon: Edward Hint, MD;  Location: [facility] OR;  Service: Vascular;  Laterality: Right;   FLEXIBLE SIGMOIDOSCOPY N/A 04/14/2022   Procedure: FLEXIBLE SIGMOIDOSCOPY;  Surgeon: Edward Form, MD;  Location: [facility] ENDOSCOPY;  Service: Gastroenterology;  Laterality: N/A;   INSERTION OF DIALYSIS CATHETER N/A 11/07/2022   Procedure: INSERTION OF RIGHT INTERNAL JUGULAR TUNNELED DIALYSIS CATHETER;  Surgeon: Edward Hint, MD;  Location: [facility] OR;  Service: Vascular;  Laterality: N/A;   LIGATION OF ARTERIOVENOUS  FISTULA Left 03/06/2019  Procedure: LIGATION OF ARTERIOVENOUS  FISTULA;  Surgeon: Edward Shelling, MD;  Location: [facility] OR;  Service: Vascular;  Laterality: Left;   PERIPHERAL VASCULAR BALLOON ANGIOPLASTY  10/27/2022   Procedure: PERIPHERAL VASCULAR BALLOON ANGIOPLASTY;  Surgeon: Edward Hint, MD;  Location: [facility] INVASIVE CV LAB;  Service: Cardiovascular;;  rt upper arm fistula   spinal tap     SPINE SURGERY      related to a spinal infection, unsure of surgery or infection source   WISDOM TOOTH EXTRACTION     Family History  Family History  Problem Relation Age of Onset   Hypertension Mother    Kidney failure Mother    Diabetes Mother    Hearing loss Father    Hypertension Sister    Heart disease Maternal Grandmother    Stroke Maternal Grandfather    Asthma Son    Social History  reports that he has been smoking cigarettes. He has been smoking an average of .3 packs per day. He has never used smokeless tobacco. He reports that he does not currently use alcohol. He reports current drug use. Drug: Marijuana. Allergies  Allergies  Allergen Reactions   Zestril [Lisinopril] Swelling   Nsaids Other (See Comments)    "Kidney problems "   Carvedilol Other (See Comments)    Makes mouth numb   Risperdal [Risperidone] Other (See Comments)    Unknown reaction    Home medications Prior to Admission medications   Medication Sig Start Date End Date Taking? Authorizing Provider  acetaminophen (TYLENOL) 325 MG tablet Take 2 tablets (650 mg total) by mouth every 6 (six) hours as needed for mild pain (or Fever >/= 101). 04/14/22  Yes Edward Mola, MD  amLODipine (NORVASC) 10 MG tablet TAKE 1 TABLET BY MOUTH EVERY DAY 04/10/22  Yes Edward Dick, DO  calcium acetate (PHOSLO) 667 MG capsule Take 1 capsule (667 mg total) by mouth 3 (three) times daily with meals. 01/04/22  Yes Edward Drafts, MD  cloNIDine (CATAPRES) 0.1 MG tablet Take 1 tablet (0.1 mg total) by mouth 2 (two) times daily. 08/05/22  Yes Edward Crafts, MD  divalproex (DEPAKOTE ER) 500 MG 24 hr tablet Take 1 tablet (500 mg total) by mouth at bedtime. 08/05/22  Yes Edward Dick, DO  ferric citrate (AURYXIA) 1 GM 210 MG(Fe) tablet Take 210 mg by mouth 3 (three) times daily with meals. 07/26/19  Yes [provider]  hydrALAZINE (APRESOLINE) 100 MG tablet Take 1 tablet (100 mg total) by mouth 3 (three) times daily. 05/20/22   Yes [name]., NP  losartan (COZAAR) 50 MG tablet Take 1 tablet (50 mg total) by mouth daily. 06/06/22  Yes [name], DO  pantoprazole (PROTONIX) 40 MG tablet TAKE 1 TABLET BY MOUTH EVERY DAY Patient taking differently: Take 40 mg by mouth 2 (two) times daily. 09/17/22  Yes Edward Dick, DO  cloNIDine (CATAPRES) 0.2 MG tablet Take 1 tablet (0.2 mg total) by mouth 2 (two) times daily. 11/09/22   Baglia, Corrina, PA-C  metoprolol tartrate (LOPRESSOR) 25 MG tablet Take 0.5 tablets (12.5 mg total) by mouth 2 (two) times daily. 11/09/22   Baglia, Corrina, PA-C  oxyCODONE-acetaminophen (PERCOCET) 5-325 MG tablet Take 1 tablet by mouth every 6 (six) hours as needed for severe pain. 11/09/22 11/09/23  Baglia, Corrina, PA-C  SUMAtriptan (IMITREX) 50 MG tablet Take 1 tablet (50 mg total) by mouth every 2 (two) hours as needed for migraine. Do not take more than 4 tablets in 24  hours. 08/05/22   Edward Dick, DO  triamcinolone ointment (KENALOG) 0.1 % Apply to scrotum twice daily for itching. Do not use for more than 2 weeks continuously 05/30/22   Edward Dick, DO     Vitals:   11/08/22 2245 11/09/22 0255 11/09/22 0606 11/09/22 0721  BP: (!) 180/111 (!) 172/118 (!) 176/101 (!) 193/120  Pulse:  (!) 104 (!) 105 (!) 111  Resp: 15 12 18 20   Temp:  99.1 F (37.3 C) 99.1 F (37.3 C) 98.4 F (36.9 C)  TempSrc:  Oral Oral Oral  SpO2:    98%  Weight:      Height:       Exam Gen alert, no distress No rash, cyanosis or gangrene Sclera anicteric, throat clear  No jvd or bruits Chest clear bilat to bases, no rales/ wheezing RRR no MRG Abd soft ntnd no mass or ascites +bs GU normal male MS no joint effusions or deformity Ext no LE or UE edema, no wounds or ulcers Neuro is alert, Ox 3 , nf    RUA AVF+new suture lines postop/ new RIJ TDC    Home meds include - norvasc 10, phoslo 1 ac, tid, clonidine 0.1 bid, depakote er, auryxia 1 ac tid, hydralazine 100 tid, losartan 50 every  day, protonix, sumatriptan, prns/ vits/ supps   OP HD:  [facility]S [facility]  4h  400/ 1.5  73.5kg  2/2 bath  AVF (revised here, resting)/ new TDC (placed here)  Heparin 1200     Assessment/ Plan: SP AVF revision - aneuysm plication and RIJ TDC placement. AVF resting. Post-op pain significant yesterday, improved sig today. Per VVS.  ESRD - on HD TTS.  Good compliance. Had extra here 6/28 and usual HD 6/29. Next HD Tuesday.  HTN - is getting his 4 home BP lowering meds here + prn IV meds. Probably pain related ^BP's.  Volume - wt's are down at dry wt now and pt euvolemic on exam. Is off on [place] O2 as well.  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 9.9 on admission. Follow, transfuse if < 7.0.  MBD ckd - CCa in range and phos a bit high. Cont binder(s).   HOH - long-term issue      Vinson Moselle  MD [facility] 11/09/2022, 1:17 PM  Recent Labs  Lab 11/07/22 0614 11/08/22 1134 11/09/22 0126  HGB 9.9* 7.9* 7.7*  ALBUMIN  --  3.2*  --   CALCIUM  --  9.7  --   PHOS  --  7.1*  --   CREATININE 10.50* 8.82*  --   K 4.6 3.9  --     Inpatient medications:  amLODipine  10 mg Oral Daily   calcium acetate  667 mg Oral TID WC   Chlorhexidine Gluconate Cloth  6 each Topical Q0600   Chlorhexidine Gluconate Cloth  6 each Topical Q0600   cloNIDine  0.2 mg Oral BID   divalproex  500 mg Oral QHS   ferric citrate  210 mg Oral TID WC   hydrALAZINE  100 mg Oral TID   losartan  50 mg Oral Daily   metoprolol tartrate  12.5 mg Oral BID   pantoprazole  40 mg Oral BID    sodium chloride     acetaminophen, alum & mag hydroxide-simeth, guaiFENesin-dextromethorphan, morphine injection, ondansetron, oxyCODONE-acetaminophen, oxyCODONE-acetaminophen, phenol, SUMAtriptan, zolpidem

## 2022-11-09 NOTE — Progress Notes (Signed)
Patient is complaining of cramping around his right neck, and report that his HD catheter is "sticking out". MD on call will be notified.

## 2022-11-09 NOTE — Progress Notes (Deleted)
Renal Service Consult Note [facility]  RIYANSH [name] 11/09/2022 [name], MD Requesting Physician: Dr. Myra Gianotti  Reason for Consult: ESRD pt sp access revision HPI: The patient is a 37 y.o. year-old w/ PMH as below who presented to [facility] for elective surgery for his aneurysmal RUE AV fistula. Pt underwent surgery on 6/28. Post-op he c/o SOB and CXR showed pulm edema. We were asked to see for dialysis. Pt did dialysis that evening w/ 3.5 L off. Yesterday 6/29 he had a lot of post op pain issues and he also got his usual HD (TTS) with another 3 L off. Today his pain is better but still he feels restricted in his movement and is concerned about going home. Pt is now full inpatient admission so were are doing a formal consultation.   Looks much better today. Calm, R arm resting on a pillow. Numbness and headaches are better. BP's labile still.     ROS - denies CP, no joint pain, no HA, no blurry vision, no rash, no diarrhea, no nausea/ vomiting, no dysuria, no difficulty voiding   Past Medical History  Past Medical History:  Diagnosis Date   Bipolar 1 disorder (HCC)    CKD (chronic kidney disease)    on dialysis   Depression    GERD (gastroesophageal reflux disease)    Hearing difficulty of left ear    75% hearing   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   Hypertension    Low blood sugar    Renal disorder    Past Surgical History  Past Surgical History:  Procedure Laterality Date   A/V FISTULAGRAM Right 10/27/2022   Procedure: A/V Fistulagram;  Surgeon: Chuck Hint, MD;  Location: [facility] INVASIVE CV LAB;  Service: Cardiovascular;  Laterality: Right;   APPENDECTOMY     AV FISTULA PLACEMENT Left 03/03/2019   Procedure: ARTERIOVENOUS (AV) FISTULA CREATION LEFT ARM;  Surgeon: Maeola Harman, MD;  Location: [facility] OR;  Service: Vascular;  Laterality: Left;   BASCILIC VEIN TRANSPOSITION Right 04/12/2019   Procedure: RIGHT UPPER EXTREMITY  BASCILIC VEIN TRANSPOSITION FIRST STAGE FISTULA;  Surgeon: Chuck Hint, MD;  Location[facility] OR;  Service: Vascular;  Laterality: Right;   BIOPSY  10/14/2021   Procedure: BIOPSY;  Surgeon: Sherrilyn Rist, MD;  Location: [facility] ENDOSCOPY;  Service: Gastroenterology;;   BIOPSY  06/06/2022   Procedure: BIOPSY;  Surgeon: Napoleon Form, MD;  Location: [facility] ENDOSCOPY;  Service: Gastroenterology;;   COLONOSCOPY WITH PROPOFOL N/A 10/14/2021   Procedure: COLONOSCOPY WITH PROPOFOL;  Surgeon: Sherrilyn Rist, MD;  Location: [facility] ENDOSCOPY;  Service: Gastroenterology;  Laterality: N/A;   ESOPHAGOGASTRODUODENOSCOPY (EGD) WITH PROPOFOL N/A 06/06/2022   Procedure: ESOPHAGOGASTRODUODENOSCOPY (EGD) WITH PROPOFOL;  Surgeon: Napoleon Form, MD;  Location: [facility] ENDOSCOPY;  Service: Gastroenterology;  Laterality: N/A;   FISTULA SUPERFICIALIZATION Right 11/07/2022   Procedure: PLICATION OF LARGE ANEURYSMS OF RIGHT ARTERIOVENOUS FISTULA WITH INSERTION OF 7mm INTERPOSITIONAL GORTEX GRAFT;  Surgeon: Chuck Hint, MD;  Location: [facility] OR;  Service: Vascular;  Laterality: Right;   FLEXIBLE SIGMOIDOSCOPY N/A 04/14/2022   Procedure: FLEXIBLE SIGMOIDOSCOPY;  Surgeon: Napoleon Form, MD;  Location: [facility] ENDOSCOPY;  Service: Gastroenterology;  Laterality: N/A;   INSERTION OF DIALYSIS CATHETER N/A 11/07/2022   Procedure: INSERTION OF RIGHT INTERNAL JUGULAR TUNNELED DIALYSIS CATHETER;  Surgeon: Chuck Hint, MD;  Location: [facility] OR;  Service: Vascular;  Laterality: N/A;   LIGATION OF ARTERIOVENOUS  FISTULA Left  03/06/2019   Procedure: LIGATION OF ARTERIOVENOUS  FISTULA;  Surgeon: Cephus Shelling, MD;  Location[facility] OR;  Service: Vascular;  Laterality: Left;   PERIPHERAL VASCULAR BALLOON ANGIOPLASTY  10/27/2022   Procedure: PERIPHERAL VASCULAR BALLOON ANGIOPLASTY;  Surgeon: Chuck Hint, MD;  Location: [facility] INVASIVE CV LAB;  Service: Cardiovascular;;  rt upper arm fistula   spinal tap     SPINE  SURGERY     related to a spinal infection, unsure of surgery or infection source   WISDOM TOOTH EXTRACTION     Family History  Family History  Problem Relation Age of Onset   Hypertension Mother    Kidney failure Mother    Diabetes Mother    Hearing loss Father    Hypertension Sister    Heart disease Maternal Grandmother    Stroke Maternal Grandfather    Asthma Son    Social History  reports that he has been smoking cigarettes. He has been smoking an average of .3 packs per day. He has never used smokeless tobacco. He reports that he does not currently use alcohol. He reports current drug use. Drug: Marijuana. Allergies  Allergies  Allergen Reactions   Zestril [Lisinopril] Swelling   Nsaids Other (See Comments)    "Kidney problems "   Carvedilol Other (See Comments)    Makes mouth numb   Risperdal [Risperidone] Other (See Comments)    Unknown reaction    Home medications Prior to Admission medications   Medication Sig Start Date End Date Taking? Authorizing Provider  acetaminophen (TYLENOL) 325 MG tablet Take 2 tablets (650 mg total) by mouth every 6 (six) hours as needed for mild pain (or Fever >/= 101). 04/14/22  Yes Lockie Mola, MD  amLODipine (NORVASC) 10 MG tablet TAKE 1 TABLET BY MOUTH EVERY DAY 04/10/22  Yes Sabino Dick, DO  calcium acetate (PHOSLO) 667 MG capsule Take 1 capsule (667 mg total) by mouth 3 (three) times daily with meals. 01/04/22  Yes Vonna Drafts, MD  cloNIDine (CATAPRES) 0.1 MG tablet Take 1 tablet (0.1 mg total) by mouth 2 (two) times daily. 08/05/22  Yes Corky Crafts, MD  divalproex (DEPAKOTE ER) 500 MG 24 hr tablet Take 1 tablet (500 mg total) by mouth at bedtime. 08/05/22  Yes Sabino Dick, DO  ferric citrate (AURYXIA) 1 GM 210 MG(Fe) tablet Take 210 mg by mouth 3 (three) times daily with meals. 07/26/19  Yes [provider]  hydrALAZINE (APRESOLINE) 100 MG tablet Take 1 tablet (100 mg total) by mouth 3 (three) times  daily. 05/20/22  Yes Gaston Islam., NP  losartan (COZAAR) 50 MG tablet Take 1 tablet (50 mg total) by mouth daily. 06/06/22  Yes [name], DO  pantoprazole (PROTONIX) 40 MG tablet TAKE 1 TABLET BY MOUTH EVERY DAY Patient taking differently: Take 40 mg by mouth 2 (two) times daily. 09/17/22  Yes Sabino Dick, DO  cloNIDine (CATAPRES) 0.2 MG tablet Take 1 tablet (0.2 mg total) by mouth 2 (two) times daily. 11/09/22   Baglia, Corrina, PA-C  metoprolol tartrate (LOPRESSOR) 25 MG tablet Take 0.5 tablets (12.5 mg total) by mouth 2 (two) times daily. 11/09/22   Baglia, Corrina, PA-C  oxyCODONE-acetaminophen (PERCOCET) 5-325 MG tablet Take 1 tablet by mouth every 6 (six) hours as needed for severe pain. 11/09/22 11/09/23  Baglia, Corrina, PA-C  SUMAtriptan (IMITREX) 50 MG tablet Take 1 tablet (50 mg total) by mouth every 2 (two) hours as needed for migraine. Do not take more than 4  tablets in 24 hours. 08/05/22   Sabino Dick, DO  triamcinolone ointment (KENALOG) 0.1 % Apply to scrotum twice daily for itching. Do not use for more than 2 weeks continuously 05/30/22   Sabino Dick, DO     Vitals:   11/08/22 2245 11/09/22 0255 11/09/22 0606 11/09/22 0721  BP: (!) 180/111 (!) 172/118 (!) 176/101 (!) 193/120  Pulse:  (!) 104 (!) 105 (!) 111  Resp: 15 12 18 20   Temp:  99.1 F (37.3 C) 99.1 F (37.3 C) 98.4 F (36.9 C)  TempSrc:  Oral Oral Oral  SpO2:    98%  Weight:      Height:       Exam Gen alert, no distress No rash, cyanosis or gangrene Sclera anicteric, throat clear  No jvd or bruits Chest clear bilat to bases, no rales/ wheezing RRR no MRG Abd soft ntnd no mass or ascites +bs GU normal male MS no joint effusions or deformity Ext no LE or UE edema, no wounds or ulcers Neuro is alert, Ox 3 , nf    RUA AVF+new suture lines postop/ new RIJ TDC    Home meds include - norvasc 10, phoslo 1 ac, tid, clonidine 0.1 bid, depakote er, auryxia 1 ac tid, hydralazine 100 tid,  losartan 50 every day, protonix, sumatriptan, prns/ vits/ supps   OP HD:  TTS [facility]  4h  400/ 1.5  73.5kg  2/2 bath  AVF (revised here, resting)/ new TDC (placed here)  Heparin 1200     Assessment/ Plan: SP AVF revision - aneuysm plication and RIJ TDC placement. AVF resting. Post-op pain significant yesterday, improved sig today. Per VVS.  ESRD - on HD TTS.  Good compliance. Had extra here 6/28 and usual HD 6/29. Next HD Tuesday.  HTN - is getting his 4 home BP lowering meds here + prn IV meds. Probably pain related ^BP's.  Volume - wt's are down at dry wt now and pt euvolemic on exam. Is off on [place] O2 as well.  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 9.9 on admission. Follow, transfuse if < 7.0.  MBD ckd - CCa in range and phos a bit high. Cont binder(s).   HOH - long-term issue      Edward Moselle  MD [facility] 11/09/2022, 12:57 PM  Recent Labs  Lab 11/07/22 0614 11/08/22 1134 11/09/22 0126  HGB 9.9* 7.9* 7.7*  ALBUMIN  --  3.2*  --   CALCIUM  --  9.7  --   PHOS  --  7.1*  --   CREATININE 10.50* 8.82*  --   K 4.6 3.9  --    Inpatient medications:  amLODipine  10 mg Oral Daily   calcium acetate  667 mg Oral TID WC   Chlorhexidine Gluconate Cloth  6 each Topical Q0600   Chlorhexidine Gluconate Cloth  6 each Topical Q0600   cloNIDine  0.2 mg Oral BID   divalproex  500 mg Oral QHS   ferric citrate  210 mg Oral TID WC   hydrALAZINE  100 mg Oral TID   losartan  50 mg Oral Daily   metoprolol tartrate  12.5 mg Oral BID   pantoprazole  40 mg Oral BID    sodium chloride     acetaminophen, alum & mag hydroxide-simeth, guaiFENesin-dextromethorphan, morphine injection, ondansetron, oxyCODONE-acetaminophen, oxyCODONE-acetaminophen, phenol, SUMAtriptan, zolpidem            Pt seen in room this am. Got HD last night w/ 3.5 L  off. Breathing much better and lungs are clear. This am is still having a lot of post-op arm pain, and BP's are up. Since not looking like he will be dc'd now, will  plan dialysis here this afternoon as this is his usual day and he still has volume on.   If pt is formally admitted will do full consult.     TTS NW  4h  400/ 1.5  73.5kg  2/2 bath  AVF (revised today)/ new TDC (placed today)  Heparin 1200   Rob Kevon Tench  MD  [facility] 11/09/2022, 12:57 PM  Recent Labs  Lab 11/07/22 0614 11/08/22 1134 11/09/22 0126  HGB 9.9* 7.9* 7.7*  ALBUMIN  --  3.2*  --   CALCIUM  --  9.7  --   PHOS  --  7.1*  --   CREATININE 10.50* 8.82*  --   K 4.6 3.9  --      Inpatient medications:  amLODipine  10 mg Oral Daily   calcium acetate  667 mg Oral TID WC   Chlorhexidine Gluconate Cloth  6 each Topical Q0600   Chlorhexidine Gluconate Cloth  6 each Topical Q0600   cloNIDine  0.2 mg Oral BID   divalproex  500 mg Oral QHS   ferric citrate  210 mg Oral TID WC   hydrALAZINE  100 mg Oral TID   losartan  50 mg Oral Daily   metoprolol tartrate  12.5 mg Oral BID   pantoprazole  40 mg Oral BID    sodium chloride     acetaminophen, alum & mag hydroxide-simeth, guaiFENesin-dextromethorphan, morphine injection, ondansetron, oxyCODONE-acetaminophen, oxyCODONE-acetaminophen, phenol, SUMAtriptan, zolpidem

## 2022-11-09 NOTE — Plan of Care (Signed)

## 2022-11-09 NOTE — Progress Notes (Addendum)
Progress Note    11/09/2022 8:36 AM 2 Days Post-Op  Subjective:  says he is feeling much better today but still with a lot of pain in right upper arm. He is also concerned about his elevated blood pressure   Vitals:   11/09/22 0606 11/09/22 0721  BP: (!) 176/101 (!) 193/120  Pulse: (!) 105 (!) 111  Resp: 18 20  Temp: 99.1 F (37.3 C) 98.4 F (36.9 C)  SpO2:     Physical Exam: Cardiac:  regular Lungs:  non labored Incisions:  right upper arm incisions are clean, dry and intact Extremities:  right arm well perfused and warm, 2+ radial pulse. Palpable thrill present in graft. Mild swelling of right upper arm Neurologic: alert and oriented  CBC    Component Value Date/Time   WBC 7.3 11/09/2022 0126   RBC 2.43 (L) 11/09/2022 0126   HGB 7.7 (L) 11/09/2022 0126   HGB 10.4 (L) 03/17/2022 1721   HCT 24.4 (L) 11/09/2022 0126   HCT 30.2 (L) 03/17/2022 1721   PLT 213 11/09/2022 0126   PLT 207 03/17/2022 1721   MCV 100.4 (H) 11/09/2022 0126   MCV 87 03/17/2022 1721   MCH 31.7 11/09/2022 0126   MCHC 31.6 11/09/2022 0126   RDW 16.8 (H) 11/09/2022 0126   RDW 14.0 03/17/2022 1721   LYMPHSABS 1.7 10/27/2022 0001   MONOABS 0.6 10/27/2022 0001   EOSABS 0.2 10/27/2022 0001   BASOSABS 0.1 10/27/2022 0001    BMET    Component Value Date/Time   NA 137 11/08/2022 1134   NA 139 06/16/2022 1606   K 3.9 11/08/2022 1134   CL 93 (L) 11/08/2022 1134   CO2 25 11/08/2022 1134   GLUCOSE 83 11/08/2022 1134   BUN 46 (H) 11/08/2022 1134   BUN 72 (H) 06/16/2022 1606   CREATININE 8.82 (H) 11/08/2022 1134   CALCIUM 9.7 11/08/2022 1134   GFRNONAA 7 (L) 11/08/2022 1134   GFRAA 6 (L) 10/26/2019 1032    INR No results found for: "INR"   Intake/Output Summary (Last 24 hours) at 11/09/2022 0836 Last data filed at 11/09/2022 0724 Gross per 24 hour  Intake 240 ml  Output 2500 ml  Net -2260 ml     Assessment/Plan:  37 y.o. male is s/p  ight internal jugular TDC placement and excision  of large aneurysmal right upper arm fistula with revision using a PTFE graft  2 Days Post-Op   RUE well perfused and warm. Dressings removed Ice okay to upper arm for comfort/ pain control Pain control PRN Encourage patient to elevate arm to help with swelling Headache improved BP somewhat improved but still elevated HD yesterday  Appreciate [facility] and Nephrology assistance with management He will resume home medications. New prescriptions will be sent to his pharmacy  Resume his normal TTS outpatient dialysis schedule Follow up with Family medicine on 7/2 Will have follow up for incision check in 2-3 weeks in our office Plan will be for discharge tomorrow needs one more day for post op pain control    Dory [facility] [phone] 11/09/2022 8:36 AM  I agree with the above.  I have seen and evaluated the patient.  I appreciate family medicine assistance with his blood pressure.  He states that his headaches and vision issues are much improved.  He is still complaining of significant pain in his right arm surgical site and does not want to go home.  I told him we would keep him  1 day or day for pain control and discharge him home tomorrow  Durene Cal

## 2022-11-10 ENCOUNTER — Inpatient Hospital Stay (HOSPITAL_COMMUNITY): Payer: Medicare Other

## 2022-11-10 DIAGNOSIS — R0789 Other chest pain: Secondary | ICD-10-CM

## 2022-11-10 LAB — BASIC METABOLIC PANEL
Anion gap: 16 — ABNORMAL HIGH (ref 5–15)
BUN: 65 mg/dL — ABNORMAL HIGH (ref 6–20)
CO2: 25 mmol/L (ref 22–32)
Calcium: 8.6 mg/dL — ABNORMAL LOW (ref 8.9–10.3)
Chloride: 90 mmol/L — ABNORMAL LOW (ref 98–111)
Creatinine, Ser: 11.7 mg/dL — ABNORMAL HIGH (ref 0.61–1.24)
GFR, Estimated: 5 mL/min — ABNORMAL LOW (ref >60)
Glucose, Bld: 98 mg/dL (ref 70–99)
Potassium: 4.3 mmol/L (ref 3.5–5.1)
Sodium: 131 mmol/L — ABNORMAL LOW (ref 135–145)

## 2022-11-10 LAB — TROPONIN I (HIGH SENSITIVITY)
Troponin I (High Sensitivity): 63 ng/L — ABNORMAL HIGH (ref <18)
Troponin I (High Sensitivity): 57 ng/L — ABNORMAL HIGH (ref <18)

## 2022-11-10 LAB — GLUCOSE, CAPILLARY
Glucose-Capillary: 58 mg/dL — ABNORMAL LOW (ref 70–99)
Glucose-Capillary: 110 mg/dL — ABNORMAL HIGH (ref 70–99)
Glucose-Capillary: 117 mg/dL — ABNORMAL HIGH (ref 70–99)
Glucose-Capillary: 103 mg/dL — ABNORMAL HIGH (ref 70–99)
Glucose-Capillary: 92 mg/dL (ref 70–99)

## 2022-11-10 LAB — CBC
HCT: 25.7 % — ABNORMAL LOW (ref 39.0–52.0)
Hemoglobin: 8.4 g/dL — ABNORMAL LOW (ref 13.0–17.0)
MCH: 32.1 pg (ref 26.0–34.0)
MCHC: 32.7 g/dL (ref 30.0–36.0)
MCV: 98.1 fL (ref 80.0–100.0)
Platelets: 251 10*3/uL (ref 150–400)
RBC: 2.62 MIL/uL — ABNORMAL LOW (ref 4.22–5.81)
RDW: 16.4 % — ABNORMAL HIGH (ref 11.5–15.5)
WBC: 6.9 10*3/uL (ref 4.0–10.5)
nRBC: 0 % (ref 0.0–0.2)

## 2022-11-10 LAB — SICKLE CELL SCREEN: Sickle Cell Screen: NEGATIVE

## 2022-11-10 MED ORDER — CALCIUM CARBONATE ANTACID 500 MG PO CHEW
1.0000 | CHEWABLE_TABLET | Freq: Three times a day (TID) | ORAL | Status: DC
  Administered 2022-11-10 – 2022-11-14 (×8): 200 mg via ORAL
  Filled 2022-11-10 (×10): qty 1

## 2022-11-10 MED ORDER — CALCIUM CARBONATE ANTACID 500 MG PO CHEW: 400.0000 mg | CHEWABLE_TABLET | Freq: Three times a day (TID) | ORAL | Status: DC

## 2022-11-10 MED ORDER — LOSARTAN POTASSIUM 50 MG PO TABS
100.0000 mg | ORAL_TABLET | Freq: Every day | ORAL | Status: DC
  Administered 2022-11-10 – 2022-11-14 (×5): 100 mg via ORAL
  Filled 2022-11-10 (×5): qty 2

## 2022-11-10 MED ORDER — CLONIDINE HCL 0.1 MG PO TABS
0.2000 mg | ORAL_TABLET | Freq: Three times a day (TID) | ORAL | Status: DC
  Administered 2022-11-10 (×2): 0.2 mg via ORAL
  Filled 2022-11-10: qty 2

## 2022-11-10 NOTE — Assessment & Plan Note (Addendum)
Baseline BP 150-200/100-140.  BP improved with increased clonidine and additional dose of losartan in addition to hydralazine 100 mg (home dose). BP ranging 170s/100s. Would benefit from b-blocker for both HTN and HF.  -Continue home amlodipine 10 mg daily, hydralazine 100 mg TID -Continue clonidine to 0.2 mg BID ( prepped to send to pharmacy at d/c) -Continue lopressor 12.5 mg BID (prepped to send to pharmacy at d/c) -increase losartan to 100mg  daily -Okay to discharge from family medicine perspective. We will sign off. Please consult again if needed. -currently has appt made for BP f/u at family medicine clinic on 11/11/22

## 2022-11-10 NOTE — Progress Notes (Addendum)
[facility] Progress Note   Subjective:  Seen in room. RUE elevated and wrapped, no further intervention planned by VVS. He c/o mild CP and dyspnea which he attributes to swallowing issues - looks like going for barium swallow today? He tells me he does not feel like needs an extra HD today when I prompted. CXR clear this AM.  Objective Vitals:   11/09/22 2108 11/10/22 0022 11/10/22 0415 11/10/22 0734  BP: (!) 199/115 (!) 164/111 (!) 173/102 (!) 174/110  Pulse:    90  Resp: 19 16 17 19   Temp: 97.7 F (36.5 C) 98.4 F (36.9 C) 98.5 F (36.9 C) 98 F (36.7 C)  TempSrc: Oral Oral Oral Oral  SpO2:  97% 100%   Weight:      Height:       Physical Exam General: NAD, O2 cannula placed on patient - was lying on floor. Heart: RRR; no murmur Lungs: CTAB Abdomen: soft Extremities: no LE edema, RUE was significant edema/bandaged Dialysis Access: [facility] in R chest  Additional Objective Labs: Basic Metabolic Panel: Recent Labs  Lab 11/07/22 0614 11/08/22 1134 11/10/22 0904  NA 137 137 131*  K 4.6 3.9 4.3  CL 99 93* 90*  CO2  --  25 25  GLUCOSE 85 83 98  BUN 59* 46* 65*  CREATININE 10.50* 8.82* 11.70*  CALCIUM  --  9.7 8.6*  PHOS  --  7.1*  --    Liver Function Tests: Recent Labs  Lab 11/08/22 1134  ALBUMIN 3.2*   CBC: Recent Labs  Lab 11/08/22 1134 11/09/22 0126 11/10/22 0904  WBC 7.9 7.3 6.9  HGB 7.9* 7.7* 8.4*  HCT 24.7* 24.4* 25.7*  MCV 98.0 100.4* 98.1  PLT 209 213 251   CBG: Recent Labs  Lab 11/07/22 0729 11/07/22 1042 11/07/22 1150 11/10/22 0612 11/10/22 0644  GLUCAP 82 79 106* 58* 110*   Studies/Results: DG Chest 1 View  Result Date: 11/10/2022 CLINICAL DATA:  [id number] with chest pain. EXAM: CHEST  1 VIEW COMPARISON:  11/08/2022 portable chest. FINDINGS: 6:59 a.m. Right IJ dialysis catheter again terminates about the superior cavoatrial junction. There previously was perihilar vascular congestion and mild generalized interstitial edema,  both of which are no longer seen. There is mild-to-moderate cardiomegaly but the heart silhouette is less enlarged than previously. The mediastinum is normally outlined. The lungs are clear. No significant pleural collection. No acute osseous abnormality.  Mild osteopenia. IMPRESSION: No evidence of acute chest disease. Mild-to-moderate cardiomegaly. Previous perihilar vascular congestion and mild interstitial edema are no longer seen. Right IJ dialysis catheter. Electronically Signed   By: Almira Bar M.D.   On: 11/10/2022 07:08    Medications:  sodium chloride      amLODipine  10 mg Oral Daily   calcium acetate  667 mg Oral TID WC   calcium carbonate  1 tablet Oral TID WC   Chlorhexidine Gluconate Cloth  6 each Topical Q0600   Chlorhexidine Gluconate Cloth  6 each Topical Q0600   cloNIDine  0.2 mg Oral TID   divalproex  500 mg Oral QHS   ferric citrate  210 mg Oral TID WC   hydrALAZINE  100 mg Oral TID   losartan  100 mg Oral Daily   metoprolol tartrate  12.5 mg Oral BID   pantoprazole  40 mg Oral BID    Dialysis Orders: TTS at [facility] 4hr, 400/A1.5, EDW 73.5kg, 2K/2Ca, AVF -> now TDC, heparin 1200U bolus - Mircera IV q 2 weeks -  last 6/25 - due 7/9. 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 ESRD: S/p extra HD 6/28, then back to usual. Next HD 7/2 - tomorrow. HTN/volume: BP high, prior pulm edema on CXR resolved. Continue home meds, lower EDW as tolerated. Anemia of ESRD: In addition to post-op drop, not due for ESA yet. Secondary hyperparathyroidism: Ca ok, Phos high - continue home binders. Nutrition:  Alb low, continue renal diet. Hearing loss   [name], [name] 11/10/2022, 10:52 AM  [facility]  Nephrology attending: I have personally seen and examined the patient.  Chart reviewed.  I agree with assessment and plan as outlined above. ESRD on HD status post  excision of large aneurysmal right upper extremity fistula, placement of right IJ TDC placed.  He is still having pain and unable to use dialysis for the next 2 months.  Plan for regular dialysis tomorrow.  [facility].

## 2022-11-10 NOTE — Assessment & Plan Note (Addendum)
Curbsided on-call cardiology PA this morning to discuss T wave changes on most recent EKG. PA compared to prior EKGs and recommended trending troponins. Trops were decreasing. Follow up with PA confirmed patient is cleared from cardiology perspective. - will arrange cardiology outpatient followup  Patient reports chest discomfort and difficulty swallowing. SLP recommended barium esophagram and VVS ordered.

## 2022-11-10 NOTE — Progress Notes (Addendum)
  Progress Note  VASCULAR SURGERY ASSESSMENT & PLAN:   END-STAGE RENAL DISEASE: He has a functioning right IJ tunneled dialysis catheter which is working well.  He has significant arm swelling after revision of his fistula.  I suspect this will take many weeks to resolve.  Regardless they will not be able to use his access for at least 2 months.  DIFFICULTY SWALLOWING: The patient has had problems with swallowing that is not new.  However he feels that his symptoms have gotten worse this admission.  Speech pathology has recommended a barium esophagram which I have ordered.  HYPERTENSION: Greatly appreciate Family Medicine's help in managing his blood pressure and multiple medical issues.  Cari Caraway, MD 9:16 AM    11/10/2022 7:45 AM 3 Days Post-Op  Subjective:  complaining of right hand intermittent numbness, pain in upper arm in surgical site, chest tightness, nausea, difficulty swallowing his food    Vitals:   11/10/22 0415 11/10/22 0734  BP: (!) 173/102 (!) 174/110  Pulse:  90  Resp: 17 19  Temp: 98.5 F (36.9 C) 98 F (36.7 C)  SpO2: 100%    Physical Exam: Cardiac:  regular Lungs:  non labored Incisions:  right upper arm incisions are clean, dry and intact, good thrill Extremities:  right arm well perfused and warm with palpable radial pulse  Neurologic: alert and oriented   CBC    Component Value Date/Time   WBC 7.3 11/09/2022 0126   RBC 2.43 (L) 11/09/2022 0126   HGB 7.7 (L) 11/09/2022 0126   HGB 10.4 (L) 03/17/2022 1721   HCT 24.4 (L) 11/09/2022 0126   HCT 30.2 (L) 03/17/2022 1721   PLT 213 11/09/2022 0126   PLT 207 03/17/2022 1721   MCV 100.4 (H) 11/09/2022 0126   MCV 87 03/17/2022 1721   MCH 31.7 11/09/2022 0126   MCHC 31.6 11/09/2022 0126   RDW 16.8 (H) 11/09/2022 0126   RDW 14.0 03/17/2022 1721   LYMPHSABS 1.7 10/27/2022 0001   MONOABS 0.6 10/27/2022 0001   EOSABS 0.2 10/27/2022 0001   BASOSABS 0.1 10/27/2022 0001    BMET    Component Value  Date/Time   NA 137 11/08/2022 1134   NA 139 06/16/2022 1606   K 3.9 11/08/2022 1134   CL 93 (L) 11/08/2022 1134   CO2 25 11/08/2022 1134   GLUCOSE 83 11/08/2022 1134   BUN 46 (H) 11/08/2022 1134   BUN 72 (H) 06/16/2022 1606   CREATININE 8.82 (H) 11/08/2022 1134   CALCIUM 9.7 11/08/2022 1134   GFRNONAA 7 (L) 11/08/2022 1134   GFRAA 6 (L) 10/26/2019 1032    INR No results found for: "INR"   Intake/Output Summary (Last 24 hours) at 11/10/2022 0745 Last data filed at 11/10/2022 0612 Gross per 24 hour  Intake 440 ml  Output 120 ml  Net 320 ml     Assessment/Plan:  37 y.o. male is s/p right internal jugular TDC placement and excision of large aneurysmal right upper arm fistula with revision using a PTFE graft  3 Days Post-Op   RUE well perfused, Incisions are intact and well appearing. Keep dry dressings on arm Ice pack as needed Continue to elevate right arm  Pain control PRN Complaining of difficulty swallowing and food getting stuck. Will order swallow eval BP remains elevated HD TTS. If still here will need dialysis again tomorrow Appreciate [facility] and Nephrology assistance    Graceann Congress, [place] [facility] [phone] 11/10/2022 7:45 AM

## 2022-11-10 NOTE — Assessment & Plan Note (Addendum)
Echo 06/03/22 w/ EF 55%. CXR 6/29 showing cardiomegaly and vascular congestion. Pt tachycardic. Prefers to try metoprolol as he previously tried coreg and had side effects (numbness of mouth). 11/10/22: CXR with moderate cardiomegaly. EKG shows nonspecific T wave abnormality new from last EKG.  -Cardiology recommends serial troponins, will follow up on this -lopressor 12.5 mg BID -Recommend f/u with cardiology out patient

## 2022-11-10 NOTE — Progress Notes (Signed)
Pt continues to have concerns about swallowing. Pt states he feels like it is his heart beating harder and making it harder to breath and swallow. Pt aware that heart rate/rhythm do not change. Issue is only while swallowing and no other time. Pt aware that dr will follow up with esophageal xray in the morning. Pt resting with call bell within reach.  Will continue to monitor.

## 2022-11-10 NOTE — Evaluation (Signed)
Clinical/Bedside Swallow Evaluation Patient Details  Name: [name] MRN: [id number] Date of Birth: 1985/07/17  Today's Date: 11/10/2022 Time: SLP Start Time (ACUTE ONLY): 2353 SLP Stop Time (ACUTE ONLY): 0853 SLP Time Calculation (min) (ACUTE ONLY): 16 min  Past Medical History:  Past Medical History:  Diagnosis Date   Bipolar 1 disorder (HCC)    Depression    ESRD on hemodialysis (HCC)    HD at [facility] on TTS schedule   GERD (gastroesophageal reflux disease)    Hearing difficulty of left ear    75% hearing   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   Hypertension    Low blood sugar    Past Surgical History:  Past Surgical History:  Procedure Laterality Date   A/V FISTULAGRAM Right 10/27/2022   Procedure: A/V Fistulagram;  Surgeon: Chuck Hint, MD;  Location: [facility] INVASIVE CV LAB;  Service: Cardiovascular;  Laterality: Right;   APPENDECTOMY     AV FISTULA PLACEMENT Left 03/03/2019   Procedure: ARTERIOVENOUS (AV) FISTULA CREATION LEFT ARM;  Surgeon: Maeola Harman, MD;  Location: [facility] OR;  Service: Vascular;  Laterality: Left;   BASCILIC VEIN TRANSPOSITION Right 04/12/2019   Procedure: RIGHT UPPER EXTREMITY BASCILIC VEIN TRANSPOSITION FIRST STAGE FISTULA;  Surgeon: Chuck Hint, MD;  Location: [facility] OR;  Service: Vascular;  Laterality: Right;   BIOPSY  10/14/2021   Procedure: BIOPSY;  Surgeon: Sherrilyn Rist, MD;  Location: [facility] ENDOSCOPY;  Service: Gastroenterology;;   BIOPSY  06/06/2022   Procedure: BIOPSY;  Surgeon: Napoleon Form, MD;  Location: [facility] ENDOSCOPY;  Service: Gastroenterology;;   COLONOSCOPY WITH PROPOFOL N/A 10/14/2021   Procedure: COLONOSCOPY WITH PROPOFOL;  Surgeon: Sherrilyn Rist, MD;  Location: [facility] ENDOSCOPY;  Service: Gastroenterology;  Laterality: N/A;   ESOPHAGOGASTRODUODENOSCOPY (EGD) WITH PROPOFOL N/A 06/06/2022   Procedure: ESOPHAGOGASTRODUODENOSCOPY (EGD) WITH PROPOFOL;  Surgeon: Napoleon Form, MD;  Location: [facility]  ENDOSCOPY;  Service: Gastroenterology;  Laterality: N/A;   FISTULA SUPERFICIALIZATION Right 11/07/2022   Procedure: PLICATION OF LARGE ANEURYSMS OF RIGHT ARTERIOVENOUS FISTULA WITH INSERTION OF 7mm INTERPOSITIONAL GORTEX GRAFT;  Surgeon: Chuck Hint, MD;  Location: [facility] OR;  Service: Vascular;  Laterality: Right;   FLEXIBLE SIGMOIDOSCOPY N/A 04/14/2022   Procedure: FLEXIBLE SIGMOIDOSCOPY;  Surgeon: Napoleon Form, MD;  Location: [facility] ENDOSCOPY;  Service: Gastroenterology;  Laterality: N/A;   INSERTION OF DIALYSIS CATHETER N/A 11/07/2022   Procedure: INSERTION OF RIGHT INTERNAL JUGULAR TUNNELED DIALYSIS CATHETER;  Surgeon: Chuck Hint, MD;  Location: [facility] OR;  Service: Vascular;  Laterality: N/A;   LIGATION OF ARTERIOVENOUS  FISTULA Left 03/06/2019   Procedure: LIGATION OF ARTERIOVENOUS  FISTULA;  Surgeon: Cephus Shelling, MD;  Location: [facility] OR;  Service: Vascular;  Laterality: Left;   PERIPHERAL VASCULAR BALLOON ANGIOPLASTY  10/27/2022   Procedure: PERIPHERAL VASCULAR BALLOON ANGIOPLASTY;  Surgeon: Chuck Hint, MD;  Location: [facility] INVASIVE CV LAB;  Service: Cardiovascular;;  rt upper arm fistula   spinal tap     SPINE SURGERY     related to a spinal infection, unsure of surgery or infection source   WISDOM TOOTH EXTRACTION     HPI:  [name] is a 37 y.o.  male who presents for follow up for dialysis access ans evaluation of his fistula . Recently his dialysis center has had a difficult time cannulating his fistula and has had to shorten his dialysis sessions. He also has had prolonged bleeding times after cannulation. There is  a note 7/1 with complaints of naseau and difficulty swallowing his food.    Assessment / Plan / Recommendation  Clinical Impression  Pt presents with a suspected primary esophageal dysphagia rather than oropharyngeal. He endorses pain in chest when swallowing, frequent eructation and esophageal globus sensation. He has pharyngeal globus  sensation however feel this is originating from his esophagus. He was able to take thin liquids via straw, pudding and graham cracker without s/s aspiration. Pt states, it often "feels like it won't go down" and he began to belch bringing up mucous into the emesis bag. Residents arrived during evaluation and SLP shared impressions and recommendations for a barium esophagram and possibly medication for reflux. Therapist educated pt on strategies to use such as smaller meals, alternating liquids and solids (although he states "that sometimes makes it worse cause it comes back up and I have to either drink liquids or burp it back up") and remain upright after meals. He states "I can never lie down after I eat." There is not much more that SLP can offer at this point and will sign off. Pt does not need a modified barium swallow (MBS).  SLP Visit Diagnosis: Dysphagia, unspecified (R13.10)    Aspiration Risk  Mild aspiration risk    Diet Recommendation Regular;Thin liquid    Liquid Administration via: Straw;Cup Medication Administration: Whole meds with liquid Supervision: Patient able to self feed Compensations: Slow rate;Small sips/bites Postural Changes: Remain upright for at least 30 minutes after po intake;Seated upright at 90 degrees    Other  Recommendations Recommended Consults: Consider esophageal assessment Oral Care Recommendations: Oral care BID    Recommendations for follow up therapy are one component of a multi-disciplinary discharge planning process, led by the attending physician.  Recommendations may be updated based on patient status, additional functional criteria and insurance authorization.  Follow up Recommendations No SLP follow up      Assistance Recommended at Discharge    Functional Status Assessment Patient has not had a recent decline in their functional status  Frequency and Duration            Prognosis        Swallow Study   General Date of Onset:  11/10/22 HPI: [name] is a 37 y.o.  male who presents for follow up for dialysis access ans evaluation of his fistula . Recently his dialysis center has had a difficult time cannulating his fistula and has had to shorten his dialysis sessions. He also has had prolonged bleeding times after cannulation. There is a note 7/1 with complaints of naseau and difficulty swallowing his food. Type of Study: Bedside Swallow Evaluation Previous Swallow Assessment: none Diet Prior to this Study: Regular;Thin liquids (Level 0) Temperature Spikes Noted: No Respiratory Status: Room air History of Recent Intubation: No Behavior/Cognition: Pleasant mood;Cooperative;Alert Oral Cavity Assessment: Within Functional Limits Oral Care Completed by SLP: No Oral Cavity - Dentition: Adequate natural dentition Vision: Functional for self-feeding Self-Feeding Abilities: Able to feed self Patient Positioning: Upright in bed Baseline Vocal Quality: Normal Volitional Cough: Strong Volitional Swallow: Able to elicit    Oral/Motor/Sensory Function Overall Oral Motor/Sensory Function: Within functional limits   Ice Chips Ice chips: Not tested   Thin Liquid Thin Liquid: Within functional limits Presentation: Straw    Nectar Thick Nectar Thick Liquid: Not tested   Honey Thick Honey Thick Liquid: Not tested   Puree Puree: Within functional limits   Solid     Solid: Within functional limits  Royce Macadamia 11/10/2022,9:26 AM

## 2022-11-10 NOTE — Consult Note (Addendum)
FMTS ATTENDING Consultation NOTE Edward Wilcher,MD  Patient c/o generalized weakness, tingling of his extremities, RUL pain, chest pain (feels like something is sitting on his chest), and palpitation. He said when this started, he was told his blood sugar was low. When I came in, he had already drunk a cup of apple juice, and the NT was there to recheck his CBG.   Exam: Gen: Anxious and upset Neuro: Awake and alert, of facial asymmetry, no sensory loss, power of his UL 5/5, CN II-XII intact Heart: No murmurs. RRR Lungs: CTA Ext: RUE tender and swollen with no erythema  A/P: Generalized weakness/Hypoglycemia: Weakness tingling likely due to hypoglycemia I am unclear why he became hypoglycemia He had his dinner last night, he said His repeat CBG at this time is 110 I encouraged him to eat his breakfast, and we will monitor his CBG closely CBC, Bmet ordered    Chest pain: Might be some element of anxiety His HR and O2 Sat were normal on telemetry, and no rhythm abnormality on telemetry. Check  TNI stat, chest x-ray, and EKG stat The resident was notified to order, and they will re-eval him again this morning. He calmed down a bit before I left his room Monitor closely for now.  HTN: BP still elevated this morning Will recheck again, given the recent event and right arm pain Adjust meds as needed based on repeat BP  RUE pain and swelling: S/P Excision of AVF aneurysm Vascular managing Oxycodone ordered prn pain Ice pack compression of his RUE prn swelling and pain Resient to reach out if pain worsens  ESRD on HD: Per nephrology

## 2022-11-10 NOTE — Progress Notes (Signed)
Chart reviewed. Case discussed with renal PA. Contacted pt's clinic to inquire if they had a 2nd shift appt tomorrow in the event pt stable for d/c tomorrow am. Clinic does not have any availability for 2nd shift out-pt HD tomorrow. Will advise clinic of pt's d/c date once confirmed. Will assist as needed.   Olivia Canter Renal Navigator [phone]

## 2022-11-10 NOTE — Progress Notes (Deleted)
Daily Progress Note Intern Pager: [phone]  Patient name: [name] Medical record number: [id number] Date of birth: 1985/06/22 Age: 37 y.o. Gender: male  Primary Care Provider: [name], DO Admitting: VVS Code Status: FULL   Assessment and Plan: [name] is a 36 y.o. male admitted by VVS, for TCD placement and excision of aneurysmal fistula with revision which was completed on 6/28.  After HD on 6/29 BP became elevated in 200s/100s and FMTS consulted for blood pressure management. PMHx significant for HTN, HFmrEF, Alport syndrome, ESRD on HD TTS.   Hospital Problem List as of 11/10/2022          Priority Resolved POA     2.     Severe uncontrolled hypertension 2.  Yes    Overview Addendum 11/08/2022 10:22 PM by [name], DO     Current HTN meds: amlodipine 10mg , hyralazine 100mg  TID, losartan 50mg , clonidine 0.1mg  BID Previously tried: lisinopril 5mg  (swelling), isosorbide mononitrate 30mg  , carvedilol (side effects) BP goal: < 130/80        3.     ESRD (end stage renal disease) (HCC) 3.  Yes     Unprioritized     * (Principal) A-V fistula (HCC)   Yes     Heart failure with mildly reduced ejection fraction (HFmrEF) (HCC)   Yes    Overview Signed 04/08/2022 12:33 PM by Celine Mans, MD     Diagnosed Hospital Admission October 2023 for shortness of breath due to volume overload. Several missed dialysis sessions.  EF 40-45% at that time.        Hypertensive crisis   Yes     Migraines   Yes     Aneurysm of arteriovenous dialysis fistula (HCC)   Yes     Macrocytic anemia   Unknown     Chronic diastolic CHF (congestive heart failure) (HCC)   Unknown     Aneurysm of arteriovenous fistula (HCC)   Yes      FEN/GI: Renal diet PPx: SCDs Dispo:Home tomorrow. Barriers include BP instability.   Subjective:  Patient seen today with Speech Therapy present. Patient expresses difficulty swallowing and feels like throat is tight. No difficulty breathing  or abdominal pain. ST recommends barium esophagram. Patient also complains of ongoing swelling and discomfort to right arm.  Objective: Temp:  [97.7 F (36.5 C)-98.5 F (36.9 C)] 98.5 F (36.9 C) (07/01 0415) Pulse Rate:  [93-97] 97 (06/30 1619) Resp:  [12-19] 17 (07/01 0415) BP: (161-199)/(96-115) 173/102 (07/01 0415) SpO2:  [97 %-100 %] 100 % (07/01 0415) Physical Exam: General: no acute distress, sitting up in bed Cardiovascular: RRR, mildly tachycardic Respiratory: lungs CTA bilaterally Abdomen: bowel sounds present, soft, nontender, nondistended Extremities: moving all extremities equally. Mild swelling to right forearm, no pallor or temperature changes, and with strong radial pulse.  Laboratory: Most recent CBC Lab Results  Component Value Date   WBC 7.3 11/09/2022   HGB 7.7 (L) 11/09/2022   HCT 24.4 (L) 11/09/2022   MCV 100.4 (H) 11/09/2022   PLT 213 11/09/2022   Most recent BMP    Latest Ref Rng & Units 11/08/2022   11:34 AM  BMP  Glucose 70 - 99 mg/dL 83   BUN 6 - 20 mg/dL 46   Creatinine 1.47 - 1.24 mg/dL 8.29   Sodium 562 - 130 mmol/L 137   Potassium 3.5 - 5.1 mmol/L 3.9   Chloride 98 - 111 mmol/L 93   CO2 22 - 32  mmol/L 25   Calcium 8.9 - 10.3 mg/dL 9.7       Imaging/Diagnostic Tests: Radiologist Impression: "No evidence of acute chest disease. Mild-to-moderate cardiomegaly.   Previous perihilar vascular congestion and mild interstitial edema are no longer seen. Right IJ dialysis catheter." My interpretation: Cardiomegaly present. Improvement in interstitial edema since last CXR.  [name], DO 11/10/2022, 7:31 AM  PGY-1, [facility] FPTS Intern pager: [phone], text pages welcome Secure chat group [facility] Teaching Service

## 2022-11-10 NOTE — Progress Notes (Addendum)
Daily Progress Note Intern Pager: [phone]  Patient name: [name] Medical record number: [id number] Date of birth: 28-Aug-1985 Age: 37 y.o. Gender: male  Primary Care Provider: [name], DO Admitting: VVS Code Status: FULL   Assessment and Plan: PEDROHENRIQUE [name] is a 37 y.o. male admitted by VVS, for TCD placement and excision of aneurysmal fistula with revision which was completed on 6/28.  After HD on 6/29 BP became elevated in 200s/100s and FMTS consulted for blood pressure management. PMHx significant for HTN, HFmrEF, Alport syndrome, ESRD on HD TTS.   Hospital Problem List      Hospital     * (Principal) A-V fistula [facility])     Severe uncontrolled hypertension     Baseline BP 150-200/100-140.  BP improved with increased clonidine and  additional dose of losartan in addition to hydralazine 100 mg (home dose).  BP ranging 170s/100s. Would benefit from b-blocker for both HTN and HF.  -Continue home amlodipine 10 mg daily, hydralazine 100 mg TID -Increased clonidine to 0.2 mg TID ( prepped to send to pharmacy at d/c) -Continue lopressor 12.5 mg BID (prepped to send to pharmacy at d/c) -increase losartan to 100mg  daily -currently has appt made for BP f/u at family medicine clinic on 11/11/22         ESRD (end stage renal disease) (HCC)     S/p TDC placement and excision of right arm aneurysmal fistula with  revision with VVS on 6/28.  Nephrology managing HD. Has HD TTS. -Continue plans per VVS and nephrology         Chest pain     Curbsided on-call cardiology PA this morning to discuss T wave changes  on most recent EKG. PA compared to prior EKGs and recommended trending  troponins. Trops were decreasing. Follow up with PA confirmed patient is  cleared from cardiology perspective. - will arrange cardiology outpatient followup  Patient reports chest discomfort and difficulty swallowing. SLP  recommended barium esophagram and VVS ordered.        Heart  failure with mildly reduced ejection fraction (HFmrEF) (HCC)     Echo 06/03/22 w/ EF 55%. CXR 6/29 showing cardiomegaly and vascular  congestion. Pt tachycardic. Prefers to try metoprolol as he previously  tried coreg and had side effects (numbness of mouth). 11/10/22: CXR with moderate cardiomegaly. EKG shows nonspecific T wave  abnormality new from last EKG.  -Cardiology recommends serial troponins, will follow up on this -lopressor 12.5 mg BID -Recommend f/u with cardiology out patient         Hypertensive crisis     BP still elevated, continue dialysis today. Home meds include  amlodipine 10 mg daily, coreg 25 mg twice daily and hydralazine 50 mg 3  times daily, Imdur 30 mg daily. - Continue home medications - prn labetalol until he receives HD - Monitor BP        Migraines     Migraine and vomiting both resolved ON with prn sumatriptan. -Continue Tylenol -Continue sumatriptan prn -Continue home Depakote        Aneurysm of arteriovenous dialysis fistula (HCC)     Macrocytic anemia     Hgb 9.9>7.9>7.7 s/p surgery. Transfusion threshold <7. Of note dose  have hx of lower GI bleed, previous colonoscopy showing diverticulosis. No  blood per rectum during hospitalization per pt.  -recheck CBC out pt at family medicine clinic  -Consider iron studies        Chronic diastolic CHF (congestive  heart failure) (HCC)     Aneurysm of arteriovenous fistula (HCC)    FEN/GI: Renal diet PPx: SCDs Dispo:Home tomorrow. Barriers include BP instability.   Subjective:  Patient seen today with Speech Therapy present. Patient expresses difficulty swallowing and feels like throat is tight. No difficulty breathing or abdominal pain. ST recommends barium esophagram. Patient also complains of ongoing swelling and discomfort to right arm   Objective: Temp:  [97.7 F (36.5 C)-98.5 F (36.9 C)] 98 F (36.7 C) (07/01 0734) Pulse Rate:  [90-97] 90 (07/01 0734) Resp:  [12-19] 19 (07/01 0734) BP:  (161-199)/(96-115) 174/110 (07/01 0734) SpO2:  [97 %-100 %] 100 % (07/01 0415) Physical Exam: General: no acute distress, sitting up in bed Cardiovascular: RRR, mildly tachycardic Respiratory: lungs CTA bilaterally Abdomen: bowel sounds present, soft, nontender, nondistended Extremities: moving all extremities equally. Mild swelling to right forearm, no pallor or temperature changes, and with strong radial pulse.  Laboratory: Most recent CBC Lab Results  Component Value Date   WBC 6.9 11/10/2022   HGB 8.4 (L) 11/10/2022   HCT 25.7 (L) 11/10/2022   MCV 98.1 11/10/2022   PLT 251 11/10/2022   Most recent BMP    Latest Ref Rng & Units 11/10/2022    9:04 AM  BMP  Glucose 70 - 99 mg/dL 98   BUN 6 - 20 mg/dL 65   Creatinine 7.82 - 1.24 mg/dL 95.62   Sodium 130 - 865 mmol/L 131   Potassium 3.5 - 5.1 mmol/L 4.3   Chloride 98 - 111 mmol/L 90   CO2 22 - 32 mmol/L 25   Calcium 8.9 - 10.3 mg/dL 8.6      Imaging/Diagnostic Tests: Radiologist Impression: "No evidence of acute chest disease. Mild-to-moderate cardiomegaly.   Previous perihilar vascular congestion and mild interstitial edema are no longer seen. Right IJ dialysis catheter." My interpretation: Cardiomegaly present. Improvement in interstitial edema since last CXR.  [name], DO 11/10/2022, 2:18 PM  PGY-1, [facility] FPTS Intern pager: [phone], text pages welcome Secure chat group [facility] Teaching Service

## 2022-11-10 NOTE — Assessment & Plan Note (Signed)
S/p [facility] placement and excision of right arm aneurysmal fistula with revision with VVS on 6/28.  Nephrology managing HD. Has HD TTS. -Continue plans per VVS and nephrology

## 2022-11-11 ENCOUNTER — Ambulatory Visit: Payer: Self-pay | Admitting: Student

## 2022-11-11 DIAGNOSIS — D638 Anemia in other chronic diseases classified elsewhere: Secondary | ICD-10-CM

## 2022-11-11 DIAGNOSIS — R051 Acute cough: Secondary | ICD-10-CM

## 2022-11-11 DIAGNOSIS — R519 Headache, unspecified: Secondary | ICD-10-CM

## 2022-11-11 DIAGNOSIS — I120 Hypertensive chronic kidney disease with stage 5 chronic kidney disease or end stage renal disease: Secondary | ICD-10-CM

## 2022-11-11 DIAGNOSIS — R933 Abnormal findings on diagnostic imaging of other parts of digestive tract: Secondary | ICD-10-CM

## 2022-11-11 DIAGNOSIS — N186 End stage renal disease: Secondary | ICD-10-CM | POA: Diagnosis not present

## 2022-11-11 DIAGNOSIS — R1310 Dysphagia, unspecified: Secondary | ICD-10-CM

## 2022-11-11 DIAGNOSIS — I10 Essential (primary) hypertension: Secondary | ICD-10-CM

## 2022-11-11 DIAGNOSIS — Z992 Dependence on renal dialysis: Secondary | ICD-10-CM | POA: Diagnosis not present

## 2022-11-11 DIAGNOSIS — R1319 Other dysphagia: Secondary | ICD-10-CM

## 2022-11-11 DIAGNOSIS — I1A0 Resistant hypertension: Secondary | ICD-10-CM

## 2022-11-11 DIAGNOSIS — Q8781 Alport syndrome: Secondary | ICD-10-CM | POA: Diagnosis not present

## 2022-11-11 LAB — GLUCOSE, CAPILLARY
Glucose-Capillary: 80 mg/dL (ref 70–99)
Glucose-Capillary: 78 mg/dL (ref 70–99)
Glucose-Capillary: 145 mg/dL — ABNORMAL HIGH (ref 70–99)
Glucose-Capillary: 70 mg/dL (ref 70–99)
Glucose-Capillary: 122 mg/dL — ABNORMAL HIGH (ref 70–99)
Glucose-Capillary: 129 mg/dL — ABNORMAL HIGH (ref 70–99)

## 2022-11-11 MED ORDER — HEPARIN SODIUM (PORCINE) 1000 UNIT/ML IJ SOLN
INTRAMUSCULAR | Status: AC
  Administered 2022-11-11: 1000 [IU]
  Filled 2022-11-11: qty 4

## 2022-11-11 MED ORDER — METOPROLOL TARTRATE 25 MG PO TABS
25.0000 mg | ORAL_TABLET | Freq: Two times a day (BID) | ORAL | Status: DC
  Administered 2022-11-11 – 2022-11-12 (×3): 25 mg via ORAL
  Filled 2022-11-11 (×4): qty 1

## 2022-11-11 MED ORDER — DEXTROSE 50 % IV SOLN
12.5000 g | INTRAVENOUS | Status: AC
  Administered 2022-11-11: 12.5 g via INTRAVENOUS

## 2022-11-11 MED ORDER — DEXTROSE 50 % IV SOLN
INTRAVENOUS | Status: AC
  Filled 2022-11-11: qty 50

## 2022-11-11 MED ORDER — ENSURE ENLIVE PO LIQD
237.0000 mL | Freq: Two times a day (BID) | ORAL | Status: DC
  Administered 2022-11-11 – 2022-11-12 (×2): 237 mL via ORAL
  Filled 2022-11-11: qty 237

## 2022-11-11 MED ORDER — CLONIDINE HCL 0.1 MG PO TABS
0.3000 mg | ORAL_TABLET | Freq: Three times a day (TID) | ORAL | Status: DC
  Administered 2022-11-11 – 2022-11-14 (×9): 0.3 mg via ORAL
  Filled 2022-11-11 (×5): qty 3
  Filled 2022-11-11: qty 1
  Filled 2022-11-11 (×4): qty 3

## 2022-11-11 NOTE — Consult Note (Signed)
The patient endorsed yet another symptom of hypoglycemia overnight. He also stated that he had been coughing up sputum since yesterday. No chest pain, but needed to be on O2 supplement overnight for comfort. Also still has right-sided headaches.  Exam: Gen: Anxious Neuro: Grossly intact Heart: No murmurs. RRR Lungs: Air entry CTA B/L  A/P: HTNsive crisis - Resistant HTN Continue amlodipine 10 mg QD, increase Clonidine to 0.3 mg TID, continue Losartan 100 mg QD and Hydralazine 100 mg TID with Metoprolol 12.5 mg BID. Monitor BP closely  Dysphagia: Barium swallow indicates esophageal stricture EGD in Jan/2024 showed erosive gastritis Continue PPI Consult GI  Cough: started overnight Yellowish brown sputum in the emesis bag He coughed out a small sputum with a blood streak (?? Related to retching from his esophageal gastritis symptoms above) Cxray from yesterday looked reassuring.  Consider Covid-19 testing, quantiferone gold/AFB culture if symptoms persist +/- CT chest. Continue Robitussin prn cough Monitor for improvement  Recurrent hypoglycemia - chronic since 2022 He had a one-time lowest CBG of 57. He had been in the 70 -100 and above since then No DM2, and he is not on insulin Plan to supplement his meal with Ensure BID Monitor CBG prn symptoms  Headache - Mostly unilateral Likely Migraine HA CT head was 09/2022 was negative for any intracranial pathology Continue Tylenol as needed Monitor neurologic status closely  Disposition: FMTS will continue to monitor along with Vascular for now. F/U GI eval.

## 2022-11-11 NOTE — Anesthesia Preprocedure Evaluation (Signed)
Anesthesia Evaluation  Patient identified by MRN, date of birth, ID band Patient awake    Reviewed: Allergy & Precautions, NPO status , Patient's Chart, lab work & pertinent test results, reviewed documented beta blocker date and time   History of Anesthesia Complications Negative for: history of anesthetic complications  Airway Mallampati: II  TM Distance: >3 FB Neck ROM: Full    Dental  (+) Dental Advisory Given   Pulmonary Current Smoker and Patient abstained from smoking.   Pulmonary exam normal        Cardiovascular hypertension, Pt. on medications and Pt. on home beta blockers Normal cardiovascular exam     Neuro/Psych  Headaches PSYCHIATRIC DISORDERS  Depression Bipolar Disorder    Self-mutilating behavior  B/l hearing loss     GI/Hepatic Neg liver ROS,GERD  Controlled,,  Endo/Other  negative endocrine ROS    Renal/GU ESRF and DialysisRenal disease     Musculoskeletal negative musculoskeletal ROS (+)    Abdominal   Peds  Hematology  (+) Blood dyscrasia, anemia   Anesthesia Other Findings Noncompliance    Reproductive/Obstetrics                             Anesthesia Physical Anesthesia Plan  ASA: 4  Anesthesia Plan: MAC   Post-op Pain Management: Minimal or no pain anticipated   Induction:   PONV Risk Score and Plan: 0 and Propofol infusion and Treatment may vary due to age or medical condition  Airway Management Planned: Nasal Cannula and Natural Airway  Additional Equipment: None  Intra-op Plan:   Post-operative Plan:   Informed Consent: I have reviewed the patients History and Physical, chart, labs and discussed the procedure including the risks, benefits and alternatives for the proposed anesthesia with the patient or authorized representative who has indicated his/her understanding and acceptance.       Plan Discussed with: CRNA and  Anesthesiologist  Anesthesia Plan Comments:         Anesthesia Quick Evaluation

## 2022-11-11 NOTE — Progress Notes (Signed)
Daily Progress Note Intern Pager: [phone]  Patient name: [name] Medical record number: [id number] Date of birth: June 30, 1985 Age: 37 y.o. Gender: male  Primary Care Provider: [name], DO Admitting: VVS Code Status: FULL   Assessment and Plan: [name] is a 37 y.o. male admitted by VVS, for TCD placement and excision of aneurysmal fistula with revision which was completed on 6/28.  After HD on 6/29 BP became elevated in 200s/100s and FMTS consulted for blood pressure management. PMHx significant for HTN, HFmrEF, Alport syndrome, ESRD on HD TTS.   Hospital Problem List      Hospital     * (Principal) A-V fistula [facility])     Severe uncontrolled hypertension     Baseline BP 150-200/100-140.  BP improved with increased clonidine and  additional dose of losartan in addition to hydralazine 100 mg (home dose).  BP ranging 170s/100s. Would benefit from b-blocker for both HTN and HF.  -Continue home amlodipine 10 mg daily, hydralazine 100 mg TID -Continue clonidine to 0.2 mg BID ( prepped to send to pharmacy at d/c) -Continue lopressor 12.5 mg BID (prepped to send to pharmacy at d/c) -increase losartan to 100mg  daily -Okay to discharge from family medicine perspective. We will sign off.  Please consult again if needed. -currently has appt made for BP f/u at family medicine clinic on 11/11/22         ESRD (end stage renal disease) (HCC)     S/p [facility] placement and excision of right arm aneurysmal fistula with  revision with VVS on 6/28.  Nephrology managing HD. Has HD TTS. -Continue plans per VVS and nephrology         Chest pain     Curbsided on-call cardiology PA this morning to discuss T wave changes  on most recent EKG. PA compared to prior EKGs and recommended trending  troponins. Trops were decreasing. Follow up with PA confirmed patient is  cleared from cardiology perspective. - will arrange cardiology outpatient followup  Patient reports chest  discomfort and difficulty swallowing. SLP  recommended barium esophagram and VVS ordered.        Heart failure with mildly reduced ejection fraction (HFmrEF) (HCC)     Echo 06/03/22 w/ EF 55%. CXR 6/29 showing cardiomegaly and vascular  congestion. Pt tachycardic. Prefers to try metoprolol as he previously  tried coreg and had side effects (numbness of mouth). 11/10/22: CXR with moderate cardiomegaly. EKG shows nonspecific T wave  abnormality new from last EKG.  -Cardiology recommends serial troponins, will follow up on this -lopressor 12.5 mg BID -Recommend f/u with cardiology out patient         Hypertensive crisis     BP still elevated, continue dialysis today. Home meds include  amlodipine 10 mg daily, coreg 25 mg twice daily and hydralazine 50 mg 3  times daily, Imdur 30 mg daily. - Continue home medications - prn labetalol until he receives HD - Monitor BP        Migraines     Migraine and vomiting both resolved ON with prn sumatriptan. -Continue Tylenol -Continue sumatriptan prn -Continue home Depakote        Aneurysm of arteriovenous dialysis fistula (HCC)     Macrocytic anemia     Hgb 9.9>7.9>7.7 s/p surgery. Transfusion threshold <7. Of note dose  have hx of lower GI bleed, previous colonoscopy showing diverticulosis. No  blood per rectum during hospitalization per pt.  -recheck CBC out pt at family  medicine clinic  -Consider iron studies        Chronic diastolic CHF (congestive heart failure) (HCC)     Aneurysm of arteriovenous fistula (HCC)    FEN/GI: Renal diet PPx: SCDs Dispo:Home tomorrow. Barriers include BP instability.   Subjective:  Patient seen today in dialysis.  [name] and talkative.  Complains of headache and is holding a washcloth over his right eye while we speak.  He had 1 episode of emesis overnight, and complains of cough this morning and feeling like food is continuing to get stuck in his throat.  Objective: Temp:  [97.5 F (36.4  C)-98.5 F (36.9 C)] 98 F (36.7 C) (07/02 0420) Pulse Rate:  [86-94] 87 (07/02 0420) Resp:  [13-21] 21 (07/02 0420) BP: (161-181)/(102-113) 174/104 (07/02 0420) SpO2:  [93 %-99 %] 99 % (07/02 0420) Physical Exam: General: Well-appearing, no acute distress Cardiovascular: RRR Respiratory: Lungs CTA bilaterally  Laboratory: Most recent CBC Lab Results  Component Value Date   WBC 6.9 11/10/2022   HGB 8.4 (L) 11/10/2022   HCT 25.7 (L) 11/10/2022   MCV 98.1 11/10/2022   PLT 251 11/10/2022   Most recent BMP    Latest Ref Rng & Units 11/10/2022    9:04 AM  BMP  Glucose 70 - 99 mg/dL 98   BUN 6 - 20 mg/dL 65   Creatinine 9.14 - 1.24 mg/dL 78.29   Sodium 562 - 130 mmol/L 131   Potassium 3.5 - 5.1 mmol/L 4.3   Chloride 98 - 111 mmol/L 90   CO2 22 - 32 mmol/L 25   Calcium 8.9 - 10.3 mg/dL 8.6      Edward Skeeters, DO 11/11/2022, 7:37 AM  PGY-1, [facility] FPTS Intern pager: [phone], text pages welcome Secure chat group [facility] Teaching Service

## 2022-11-11 NOTE — Procedures (Addendum)
Patient was seen on dialysis and the procedure was supervised.  BFR 400  Via TDC BP is  178/117. Attemp UF goal, no edema. Awaiting GI consult for esophageal stricture.  Please avoid morphine if possible but can use dilaudid for severe pain. Will dc morphine order.    Patient appears to be tolerating treatment well.  Shailynn Fong Jaynie Collins 11/11/2022

## 2022-11-11 NOTE — Progress Notes (Addendum)
  VASCULAR SURGERY ASSESSMENT & PLAN:   END-STAGE RENAL DISEASE: He has a functioning right IJ tunneled dialysis catheter which is working well.  He has significant arm swelling after revision of his fistula.  I suspect this will take many weeks to resolve.  Regardless they will not be able to use his access for at least 2 months.  Currently headed for dialysis.   ESOPHAGEAL STRICTURE: The patient has had problems with swallowing that is not new.  However he feels that his symptoms have gotten worse this admission.  Speech pathology recommended a barium swallow which shows a distal esophageal stricture.  For this reason, I have consulted GI.   HYPERTENSION: Greatly appreciate Family Medicine's help in managing his blood pressure and multiple medical issues.  SUBJECTIVE:   His chief complaint appears to be his swallowing issue.  PHYSICAL EXAM:   Vitals:   11/10/22 1638 11/10/22 1930 11/10/22 2333 11/11/22 0420  BP: (!) 181/113 (!) 174/112 (!) 161/103 (!) 174/104  Pulse: 94 90 86 87  Resp: 13 16 15  (!) 21  Temp: 98.3 F (36.8 C) 98.5 F (36.9 C) 98.4 F (36.9 C) 98 F (36.7 C)  TempSrc: Oral Oral Oral Oral  SpO2: 94% 94% 93% 99%  Weight:      Height:       Arm swelling unchanged.  LABS:   Lab Results  Component Value Date   WBC 6.9 11/10/2022   HGB 8.4 (L) 11/10/2022   HCT 25.7 (L) 11/10/2022   MCV 98.1 11/10/2022   PLT 251 11/10/2022   Lab Results  Component Value Date   CREATININE 11.70 (H) 11/10/2022   No results found for: "INR", "PROTIME" CBG (last 3)  Recent Labs    11/11/22 0538 11/11/22 0720 11/11/22 0730  GLUCAP 80 78 145*    PROBLEM LIST:    Principal Problem:   A-V fistula (HCC) Active Problems:   Severe uncontrolled hypertension   ESRD (end stage renal disease) (HCC)   Chest pain   Heart failure with mildly reduced ejection fraction (HFmrEF) (HCC)   Hypertensive crisis   Migraines   Aneurysm of arteriovenous dialysis fistula (HCC)    Macrocytic anemia   Chronic diastolic CHF (congestive heart failure) (HCC)   Aneurysm of arteriovenous fistula (HCC)   CURRENT MEDS:    amLODipine  10 mg Oral Daily   calcium acetate  667 mg Oral TID WC   calcium carbonate  1 tablet Oral TID WC   Chlorhexidine Gluconate Cloth  6 each Topical Q0600   Chlorhexidine Gluconate Cloth  6 each Topical Q0600   cloNIDine  0.2 mg Oral TID   dextrose       divalproex  500 mg Oral QHS   ferric citrate  210 mg Oral TID WC   hydrALAZINE  100 mg Oral TID   losartan  100 mg Oral Daily   metoprolol tartrate  12.5 mg Oral BID   pantoprazole  40 mg Oral BID    Waverly Ferrari Office: [phone] 11/11/2022

## 2022-11-11 NOTE — Consult Note (Signed)
Consultation  Referring Provider: No ref. provider found Primary Care Physician:  [name], DO Primary Gastroenterologist:  Dr. Myrtie Neither  Reason for Consultation:   Dysphagia  HPI: [name] is a 37 y.o. male, known to Dr. Myrtie Neither, from prior colonoscopy. Patient has history of end-stage renal disease on dialysis, hypertension, just of heart failure, decreased auditory acuity, bipolar disorder and depression. He is currently admitted over the past 4 to 5 days with hypertensive crisis despite extra dialysis session prior to admission. He had undergone excision of a large aneurysmal right upper arm fistula and revision per Dr. Durwin Nora on 11/07/2022.  Eventually admitted postoperatively due to severe hypertension and complaints of shortness of breath.  He underwent dialysis today He had complained to vascular surgery of dysphagia and subsequent barium swallow was ordered which was done yesterday this was somewhat limited due to positioning but showed a somewhat narrowed distal esophagus, 13 mm barium tablet was delayed for about 5 minutes at this area and then passed.  Otherwise normal-appearing esophagus no hiatal hernia there is some evidence of mild dysmotility.  Patient did have prior EGD done in January 2024 due to complaints of chest pain and epigastric pain he was noted to have patchy gastritis and duodenitis, normal-appearing esophagus.  Biopsies showed reactive gastropathy, no H. pylori..  Chest x-ray yesterday previously noted vascular congestion and mild interstitial edema are no longer seen  New labs today Yesterday-BBC 6.9/hemoglobin 8.4/hematocrit 25.7/MCV 98/platelets 251 na131/potassium 4.3 BUN 65/creatinine 11.7   Blood pressures today still quite high 190/108, and 177/109 past 2 readings   Past Medical History:  Diagnosis Date   Bipolar 1 disorder (HCC)    Depression    ESRD on hemodialysis (HCC)    HD at [facility] on TTS schedule   GERD (gastroesophageal reflux  disease)    Hearing difficulty of left ear    75% hearing   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   Hypertension    Low blood sugar     Past Surgical History:  Procedure Laterality Date   A/V FISTULAGRAM Right 10/27/2022   Procedure: A/V Fistulagram;  Surgeon: Chuck Hint, MD;  Location: [facility] INVASIVE CV LAB;  Service: Cardiovascular;  Laterality: Right;   APPENDECTOMY     AV FISTULA PLACEMENT Left 03/03/2019   Procedure: ARTERIOVENOUS (AV) FISTULA CREATION LEFT ARM;  Surgeon: Maeola Harman, MD;  Location: [facility] OR;  Service: Vascular;  Laterality: Left;   BASCILIC VEIN TRANSPOSITION Right 04/12/2019   Procedure: RIGHT UPPER EXTREMITY BASCILIC VEIN TRANSPOSITION FIRST STAGE FISTULA;  Surgeon: Chuck Hint, MD;  Location: [facility] OR;  Service: Vascular;  Laterality: Right;   BIOPSY  10/14/2021   Procedure: BIOPSY;  Surgeon: Sherrilyn Rist, MD;  Location: [facility] ENDOSCOPY;  Service: Gastroenterology;;   BIOPSY  06/06/2022   Procedure: BIOPSY;  Surgeon: Napoleon Form, MD;  Location: [facility] ENDOSCOPY;  Service: Gastroenterology;;   COLONOSCOPY WITH PROPOFOL N/A 10/14/2021   Procedure: COLONOSCOPY WITH PROPOFOL;  Surgeon: Sherrilyn Rist, MD;  Location: [facility] ENDOSCOPY;  Service: Gastroenterology;  Laterality: N/A;   ESOPHAGOGASTRODUODENOSCOPY (EGD) WITH PROPOFOL N/A 06/06/2022   Procedure: ESOPHAGOGASTRODUODENOSCOPY (EGD) WITH PROPOFOL;  Surgeon: Napoleon Form, MD;  Location: [facility] ENDOSCOPY;  Service: Gastroenterology;  Laterality: N/A;   FISTULA SUPERFICIALIZATION Right 11/07/2022   Procedure: PLICATION OF LARGE ANEURYSMS OF RIGHT ARTERIOVENOUS FISTULA WITH INSERTION OF 7mm INTERPOSITIONAL GORTEX GRAFT;  Surgeon: Chuck Hint, MD;  Location: [facility] OR;  Service:  Vascular;  Laterality: Right;   FLEXIBLE SIGMOIDOSCOPY N/A 04/14/2022   Procedure: FLEXIBLE SIGMOIDOSCOPY;  Surgeon: Napoleon Form, MD;  Location: [facility] ENDOSCOPY;  Service: Gastroenterology;   Laterality: N/A;   INSERTION OF DIALYSIS CATHETER N/A 11/07/2022   Procedure: INSERTION OF RIGHT INTERNAL JUGULAR TUNNELED DIALYSIS CATHETER;  Surgeon: Chuck Hint, MD;  Location: [facility] OR;  Service: Vascular;  Laterality: N/A;   LIGATION OF ARTERIOVENOUS  FISTULA Left 03/06/2019   Procedure: LIGATION OF ARTERIOVENOUS  FISTULA;  Surgeon: Cephus Shelling, MD;  Location: [facility] OR;  Service: Vascular;  Laterality: Left;   PERIPHERAL VASCULAR BALLOON ANGIOPLASTY  10/27/2022   Procedure: PERIPHERAL VASCULAR BALLOON ANGIOPLASTY;  Surgeon: Chuck Hint, MD;  Location: [facility] INVASIVE CV LAB;  Service: Cardiovascular;;  rt upper arm fistula   spinal tap     SPINE SURGERY     related to a spinal infection, unsure of surgery or infection source   WISDOM TOOTH EXTRACTION      Prior to Admission medications   Medication Sig Start Date End Date Taking? Authorizing Provider  acetaminophen (TYLENOL) 325 MG tablet Take 2 tablets (650 mg total) by mouth every 6 (six) hours as needed for mild pain (or Fever >/= 101). 04/14/22  Yes Lockie Mola, MD  amLODipine (NORVASC) 10 MG tablet TAKE 1 TABLET BY MOUTH EVERY DAY 04/10/22  Yes Sabino Dick, DO  calcium acetate (PHOSLO) 667 MG capsule Take 1 capsule (667 mg total) by mouth 3 (three) times daily with meals. 01/04/22  Yes Vonna Drafts, MD  cloNIDine (CATAPRES) 0.1 MG tablet Take 1 tablet (0.1 mg total) by mouth 2 (two) times daily. 08/05/22  Yes Corky Crafts, MD  divalproex (DEPAKOTE ER) 500 MG 24 hr tablet Take 1 tablet (500 mg total) by mouth at bedtime. 08/05/22  Yes Sabino Dick, DO  ferric citrate (AURYXIA) 1 GM 210 MG(Fe) tablet Take 210 mg by mouth 3 (three) times daily with meals. 07/26/19  Yes [provider]  hydrALAZINE (APRESOLINE) 100 MG tablet Take 1 tablet (100 mg total) by mouth 3 (three) times daily. 05/20/22  Yes Gaston Islam., NP  losartan (COZAAR) 50 MG tablet Take 1 tablet (50 mg total) by mouth  daily. 06/06/22  Yes [name], DO  pantoprazole (PROTONIX) 40 MG tablet TAKE 1 TABLET BY MOUTH EVERY DAY Patient taking differently: Take 40 mg by mouth 2 (two) times daily. 09/17/22  Yes Sabino Dick, DO  cloNIDine (CATAPRES) 0.2 MG tablet Take 1 tablet (0.2 mg total) by mouth 2 (two) times daily. 11/09/22   Baglia, Corrina, PA-C  metoprolol tartrate (LOPRESSOR) 25 MG tablet Take 0.5 tablets (12.5 mg total) by mouth 2 (two) times daily. 11/09/22   Baglia, Corrina, PA-C  oxyCODONE-acetaminophen (PERCOCET) 5-325 MG tablet Take 1 tablet by mouth every 6 (six) hours as needed for severe pain. 11/09/22 11/09/23  Baglia, Corrina, PA-C  SUMAtriptan (IMITREX) 50 MG tablet Take 1 tablet (50 mg total) by mouth every 2 (two) hours as needed for migraine. Do not take more than 4 tablets in 24 hours. 08/05/22   Sabino Dick, DO  triamcinolone ointment (KENALOG) 0.1 % Apply to scrotum twice daily for itching. Do not use for more than 2 weeks continuously 05/30/22   Sabino Dick, DO    Current Facility-Administered Medications  Medication Dose Route Frequency Provider Last Rate Last Admin   acetaminophen (TYLENOL) tablet 650 mg  650 mg Oral Q6H PRN Nada Libman, MD   650 mg at 11/08/22 1948  amLODipine (NORVASC) tablet 10 mg  10 mg Oral Daily Nada Libman, MD   10 mg at 11/10/22 1111   calcium acetate (PHOSLO) capsule 667 mg  667 mg Oral TID WC Nada Libman, MD   667 mg at 11/10/22 1712   calcium carbonate (TUMS - dosed in mg elemental calcium) chewable tablet 200 mg of elemental calcium  1 tablet Oral TID WC Glendale Chard, DO   200 mg of elemental calcium at 11/10/22 1712   Chlorhexidine Gluconate Cloth 2 % PADS 6 each  6 each Topical Q0600 Delano Metz, MD   6 each at 11/11/22 0547   Chlorhexidine Gluconate Cloth 2 % PADS 6 each  6 each Topical Q0600 Delano Metz, MD   6 each at 11/08/22 1123   cloNIDine (CATAPRES) tablet 0.3 mg  0.3 mg Oral TID Glendale Chard, DO        dextrose 50 % solution            divalproex (DEPAKOTE ER) 24 hr tablet 500 mg  500 mg Oral QHS Nada Libman, MD   500 mg at 11/10/22 2120   feeding supplement (ENSURE ENLIVE / ENSURE PLUS) liquid 237 mL  237 mL Oral BID BM Glendale Chard, DO       ferric citrate (AURYXIA) tablet 210 mg  210 mg Oral TID WC Nada Libman, MD   210 mg at 11/10/22 1712   guaiFENesin-dextromethorphan (ROBITUSSIN DM) 100-10 MG/5ML syrup 15 mL  15 mL Oral Q4H PRN Nada Libman, MD       hydrALAZINE (APRESOLINE) tablet 100 mg  100 mg Oral TID Nada Libman, MD   100 mg at 11/10/22 2105   losartan (COZAAR) tablet 100 mg  100 mg Oral Daily Glendale Chard, DO   100 mg at 11/10/22 1112   metoprolol tartrate (LOPRESSOR) tablet 25 mg  25 mg Oral BID Glendale Chard, DO       ondansetron [facility]) injection 4 mg  4 mg Intravenous Q6H PRN Nada Libman, MD   4 mg at 11/08/22 2032   oxyCODONE-acetaminophen (PERCOCET/ROXICET) 5-325 MG per tablet 1-2 tablet  1-2 tablet Oral Q4H PRN Nada Libman, MD   2 tablet at 11/10/22 1716   oxyCODONE-acetaminophen (PERCOCET/ROXICET) 5-325 MG per tablet 2 tablet  2 tablet Oral Q6H PRN Nada Libman, MD   2 tablet at 11/10/22 2103   pantoprazole (PROTONIX) EC tablet 40 mg  40 mg Oral BID Nada Libman, MD   40 mg at 11/10/22 2105   phenol (CHLORASEPTIC) mouth spray 1 spray  1 spray Mouth/Throat PRN Nada Libman, MD       sodium chloride 0.9 % bolus 250 mL  250 mL Intravenous Once Delano Metz, MD       SUMAtriptan (IMITREX) tablet 50 mg  50 mg Oral Q2H PRN Nada Libman, MD   50 mg at 11/11/22 1311   zolpidem (AMBIEN) tablet 5 mg  5 mg Oral QHS PRN,MR X 1 Nada Libman, MD   5 mg at 11/09/22 2104    Allergies as of 10/28/2022 - Review Complete 10/27/2022  Allergen Reaction Noted   Zestril [lisinopril] Swelling 06/13/2021   Nsaids Other (See Comments) 05/20/2014   Carvedilol Other (See Comments) 10/27/2022   Risperdal [risperidone] Other (See Comments)  06/13/2021    Family History  Problem Relation Age of Onset   Hypertension Mother    Kidney failure Mother    Diabetes Mother  Hearing loss Father    Hypertension Sister    Heart disease Maternal Grandmother    Stroke Maternal Grandfather    Asthma Son     Social History   Socioeconomic History   Marital status: Married    Spouse name: Not on file   Number of children: 3   Years of education: Not on file   Highest education level: Not on file  Occupational History   Occupation: HVAC  Tobacco Use   Smoking status: Every Day    Packs/day: .3    Types: Cigarettes    Last attempt to quit: 02/27/2016    Years since quitting: 6.7   Smokeless tobacco: Never   Tobacco comments:    Returned to smoking 2 black and milds per day  Vaping Use   Vaping Use: Never used  Substance and Sexual Activity   Alcohol use: Not Currently    Comment: rare   Drug use: Yes    Types: Marijuana   Sexual activity: Not Currently  Other Topics Concern   Not on file  Social History Narrative   Not on file   Social Determinants of Health   Financial Resource Strain: Not on file  Food Insecurity: No Food Insecurity (09/23/2022)   Hunger Vital Sign    Worried About Running Out of Food in the Last Year: Never true    Ran Out of Food in the Last Year: Never true  Transportation Needs: No Transportation Needs (09/23/2022)   PRAPARE - Administrator, Civil Service (Medical): No    Lack of Transportation (Non-Medical): No  Physical Activity: Not on file  Stress: Not on file  Social Connections: Not on file  Intimate Partner Violence: Not At Risk (09/23/2022)   Humiliation, Afraid, Rape, and Kick questionnaire    Fear of Current or Ex-Partner: No    Emotionally Abused: No    Physically Abused: No    Sexually Abused: No    Review of Systems: Pertinent positive and negative review of systems were noted in the above HPI section.  All other review of systems was otherwise negative.    Physical Exam: Vital signs in last 24 hours: Temp:  [98 F (36.7 C)-98.5 F (36.9 C)] 98.1 F (36.7 C) (07/02 1206) Pulse Rate:  [81-94] 93 (07/02 1206) Resp:  [12-24] 23 (07/02 1206) BP: (161-190)/(103-117) 177/109 (07/02 1206) SpO2:  [93 %-100 %] 100 % (07/02 1206) Weight:  [75.8 kg] 75.8 kg (07/02 0751) Last BM Date : 11/10/22 General:   Alert,  Well-developed, well-nourished, pleasant and cooperative in NAD Head:  Normocephalic and atraumatic. Eyes:  Sclera clear, no icterus.   Conjunctiva pink. Ears:  Normal auditory acuity. Nose:  No deformity, discharge,  or lesions. Mouth:  No deformity or lesions.   Neck:  Supple; no masses or thyromegaly. Lungs:  Clear throughout to auscultation.   No wheezes, crackles, or rhonchi. Heart:  Regular rate and rhythm; no murmurs, clicks, rubs,  or gallops. Abdomen:  Soft,nontender, BS active,nonpalp mass or hsm.   Rectal:  Deferred  Msk:  Symmetrical without gross deformities. . Pulses:  Normal pulses noted. Extremities:  Without clubbing or edema. Neurologic:  Alert and  oriented x4;  grossly normal neurologically. Skin:  Intact without significant lesions or rashes.. Psych:  Alert and cooperative. Normal mood and affect.  Intake/Output from previous day: 07/01 0701 - 07/02 0700 In: 250 [P.O.:250] Out: -  Intake/Output this shift: Total I/O In: -  Out: 2500 [Other:2500]  Lab Results:  Recent Labs    11/09/22 0126 11/10/22 0904  WBC 7.3 6.9  HGB 7.7* 8.4*  HCT 24.4* 25.7*  PLT 213 251   BMET Recent Labs    11/10/22 0904  NA 131*  K 4.3  CL 90*  CO2 25  GLUCOSE 98  BUN 65*  CREATININE 11.70*  CALCIUM 8.6*   LFT No results for input(s): "PROT", "ALBUMIN", "AST", "ALT", "ALKPHOS", "BILITOT", "BILIDIR", "IBILI" in the last 72 hours. PT/INR No results for input(s): "LABPROT", "INR" in the last 72 hours. Hepatitis Panel No results for input(s): "HEPBSAG", "HCVAB", "HEPAIGM", "HEPBIGM" in the last 72  hours.   IMPRESSION:  ***  PLAN: ***   Aarica Wax PA-C 11/11/2022, 1:52 PM

## 2022-11-11 NOTE — Anesthesia Postprocedure Evaluation (Signed)
Anesthesia Post Note  Patient: [name]  Procedure(s) Performed: PLICATION OF LARGE ANEURYSMS OF RIGHT ARTERIOVENOUS FISTULA WITH INSERTION OF 7mm INTERPOSITIONAL GORTEX GRAFT (Right: Arm Upper) INSERTION OF RIGHT INTERNAL JUGULAR TUNNELED DIALYSIS CATHETER (Chest)     Patient location during evaluation: PACU Anesthesia Type: General Level of consciousness: awake and alert Pain management: pain level controlled Vital Signs Assessment: post-procedure vital signs reviewed and stable Respiratory status: spontaneous breathing, nonlabored ventilation, respiratory function stable and patient connected to nasal cannula oxygen Cardiovascular status: blood pressure returned to baseline and stable Postop Assessment: no apparent nausea or vomiting Anesthetic complications: no   No notable events documented.  Last Vitals:  Vitals:   11/11/22 1200 11/11/22 1206  BP: (!) 190/108 (!) 177/109  Pulse: 94 93  Resp: (!) 23 (!) 23  Temp:  36.7 C  SpO2: 100% 100%    Last Pain:  Vitals:   11/11/22 0507  TempSrc:   PainSc: Asleep                 [name]

## 2022-11-11 NOTE — TOC Initial Note (Addendum)
Transition of Care (TOC) - Initial/Assessment Note  Donn Pierini RN, BSN Transitions of Care Unit 4E- RN Case Manager See Treatment Team for direct phone #   Patient Details  Name: [name] MRN: [id number] Date of Birth: 1986/02/24  Transition of Care [facility]) CM/SW Contact:    Darrold Span, RN Phone Number: 11/11/2022, 1:49 PM  Clinical Narrative:                 Received msg from bedside RN that father was at the bedside and requesting to speak with [facility] regarding transition plan and possible rehab?  CM went to the bedside, introduced self and CM role. Asked pt and father what questions and concerns they had regarding transitioning back home. Per pt's father he lives and works in [employer]- pt lives here in [place] independently. Per father pt does not really have anyone here in [place] that could stay with him to assist him at discharge. Father reports that he feels pt will need assistance checking his blood sugars, and blood pressures which are something new to pt as he was not doing that prior to admit. Pt also currently on 02- which is also new.  Father also is concerned that pt will not be able to take care of himself as before as he is weaker- voiced he has not been out of bed since admit and is also having increased swallowing issues. Father voicing that they want to look at pt going into a rehab prior to going home.  Explained to pt and father that insurance would not cover a rehab stay just for blood sugar, BP checks and swallowing issues. We would need to get PT/OT evals to see what recommendations they made for a safe discharge. Pt is also 36yo which also would be a barrier to SNF placement. Explained family may need to come up with a plan to assist pt at home, with [facility] support (as HH would not come daily to support in the home). Pt and father voiced understanding.   May need to consider PT/OT evals to assist in recommendations for safe discharge. TOC to follow  for transition planning.   Expected Discharge Plan: Home w Home Health Services Barriers to Discharge: Continued Medical Work up   Patient Goals and CMS Choice Patient states their goals for this hospitalization and ongoing recovery are:: to get better CMS Medicare.gov Compare Post Acute Care list provided to:: Patient Choice offered to / list presented to : Patient, Parent      Expected Discharge Plan and Services In-house Referral: Clinical Social Work Discharge Planning Services: CM Consult   Living arrangements for the past 2 months: Apartment Expected Discharge Date: 11/09/22                                    Prior Living Arrangements/Services Living arrangements for the past 2 months: Apartment Lives with:: Self Patient language and need for interpreter reviewed:: Yes        Need for Family Participation in Patient Care: Yes (Comment)     Criminal Activity/Legal Involvement Pertinent to Current Situation/Hospitalization: No - Comment as needed  Activities of Daily Living Home Assistive Devices/Equipment: Hearing aid ADL Screening (condition at time of admission) Patient's cognitive ability adequate to safely complete daily activities?: Yes Is the patient deaf or have difficulty hearing?: Yes Does the patient have difficulty seeing, even when wearing glasses/contacts?: No Does the  patient have difficulty concentrating, remembering, or making decisions?: No Patient able to express need for assistance with ADLs?: Yes Does the patient have difficulty dressing or bathing?: No Independently performs ADLs?: Yes (appropriate for developmental age) Does the patient have difficulty walking or climbing stairs?: No Weakness of Legs: None Weakness of Arms/Hands: None  Permission Sought/Granted                  Emotional Assessment Appearance:: Appears stated age Attitude/Demeanor/Rapport: Engaged Affect (typically observed): Calm Orientation: : Oriented to  Self, Oriented to Place, Oriented to Situation, Oriented to  Time      Admission diagnosis:  A-V fistula (HCC) [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] 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] Patient Active Problem List   Diagnosis Date Noted   Chronic diastolic CHF (congestive heart failure) (HCC) 11/09/2022   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   Macrocytic anemia 11/08/2022   A-V fistula (HCC) 11/07/2022   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/07/2022   Acute blood loss anemia 09/23/2022   Headache 09/23/2022   Volume overload 09/23/2022   ESRD (end stage renal disease) on dialysis (HCC) 09/23/2022   Atypical chest pain 09/23/2022   Gastritis and gastroduodenitis 06/06/2022   Migraines 06/05/2022   Pancreatitis 06/03/2022   Epigastric abdominal pain 06/03/2022   Abdominal pain 06/03/2022   Rectal bleeding 04/14/2022   Hypertensive crisis 04/13/2022   Heart failure with mildly reduced ejection fraction (HFmrEF) (HCC) 04/08/2022   Bilateral shoulder pain 04/08/2022   Acute otitis externa of both ears 04/08/2022   Pneumonia of right lower lobe due to infectious organism    Dyspnea 03/02/2022   Chest pain 03/02/2022   Hyperkalemia 03/02/2022   Leg pain, bilateral 01/03/2022   Bilateral calf pain    Housing insecurity 01/01/2022   Food insecurity 01/01/2022   Non-compliance with renal dialysis    Hearing loss 10/08/2021   Acute lower GI bleeding 06/10/2021   Family history of diabetes mellitus 02/11/2021   Hypoglycemic syndrome 02/11/2021   Rash and nonspecific skin eruption 12/16/2019   Rash on scrotum 12/16/2019   Severe uncontrolled hypertension 06/07/2018   ESRD (end stage renal disease) (HCC) 06/07/2018   Alport syndrome 07/14/2017   GERD (gastroesophageal reflux disease) 07/14/2017   History of appendectomy 07/14/2017   Obesity (BMI 30.0-34.9) 07/14/2017   Pre-transplant evaluation for kidney transplant 07/14/2017   Secondary hyperparathyroidism (HCC)  07/14/2017   Tobacco use 07/14/2017   Bipolar 1 disorder (HCC) 09/01/2013   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 [place] [address] Phone: [phone] Fax: [phone]  [id number] - [place], [place] - [address] AT [facility] [address] [place] [address] Phone: [phone] Fax: [phone]  [facility] [address] [place] [address] Phone: [phone] Fax: [phone]     Social Determinants of Health (SDOH) Social History: SDOH Screenings   Food Insecurity: No Food Insecurity (09/23/2022)  Housing: Low Risk  (09/23/2022)  Transportation Needs: No Transportation Needs (09/23/2022)  Utilities: Not At Risk (09/23/2022)  Depression (PHQ2-9): Low Risk  (09/22/2022)  Tobacco Use: High Risk (11/09/2022)   SDOH Interventions:     Readmission Risk Interventions     No data to display

## 2022-11-11 NOTE — Progress Notes (Signed)
   11/11/22 1206  Vitals  Temp 98.1 F (36.7 C)  Pulse Rate 93  Resp (!) 23  BP (!) 177/109  SpO2 100 %  Post Treatment  Dialyzer Clearance Clear  Duration of HD Treatment -hour(s) 4 hour(s)  Hemodialysis Intake (mL) 0 mL  Liters Processed 96  Fluid Removed (mL) 2500 mL  Tolerated HD Treatment Yes   Received patient in bed to unit.  Alert and oriented.  Informed consent signed and in chart.   TX duration:4hrs  Patient tolerated well.  Transported back to the room  Alert, without acute distress.  Hand-off given to patient's nurse.   Access used: [facility] Access issues: none  Total UF removed: 2.5L Medication(s) given: none    [facility]

## 2022-11-12 ENCOUNTER — Encounter (HOSPITAL_COMMUNITY)
Admission: AD | Disposition: A | Discharge status: Home / Self Care | Payer: Self-pay | Source: Home / Self Care | Attending: Vascular Surgery

## 2022-11-12 ENCOUNTER — Inpatient Hospital Stay (HOSPITAL_COMMUNITY): Payer: Medicare Other

## 2022-11-12 ENCOUNTER — Encounter (HOSPITAL_COMMUNITY): Payer: Self-pay | Admitting: Surgery

## 2022-11-12 ENCOUNTER — Inpatient Hospital Stay: Payer: Self-pay

## 2022-11-12 DIAGNOSIS — K2289 Other specified disease of esophagus: Secondary | ICD-10-CM

## 2022-11-12 DIAGNOSIS — K2950 Unspecified chronic gastritis without bleeding: Secondary | ICD-10-CM | POA: Diagnosis not present

## 2022-11-12 DIAGNOSIS — Z992 Dependence on renal dialysis: Secondary | ICD-10-CM

## 2022-11-12 DIAGNOSIS — K449 Diaphragmatic hernia without obstruction or gangrene: Secondary | ICD-10-CM

## 2022-11-12 DIAGNOSIS — I5032 Chronic diastolic (congestive) heart failure: Secondary | ICD-10-CM

## 2022-11-12 DIAGNOSIS — K2980 Duodenitis without bleeding: Secondary | ICD-10-CM | POA: Diagnosis not present

## 2022-11-12 DIAGNOSIS — N186 End stage renal disease: Secondary | ICD-10-CM

## 2022-11-12 DIAGNOSIS — F17210 Nicotine dependence, cigarettes, uncomplicated: Secondary | ICD-10-CM

## 2022-11-12 DIAGNOSIS — I132 Hypertensive heart and chronic kidney disease with heart failure and with stage 5 chronic kidney disease, or end stage renal disease: Secondary | ICD-10-CM | POA: Diagnosis not present

## 2022-11-12 DIAGNOSIS — D638 Anemia in other chronic diseases classified elsewhere: Secondary | ICD-10-CM | POA: Insufficient documentation

## 2022-11-12 DIAGNOSIS — R1310 Dysphagia, unspecified: Secondary | ICD-10-CM | POA: Diagnosis not present

## 2022-11-12 HISTORY — PX: BIOPSY: SHX5522

## 2022-11-12 HISTORY — PX: ESOPHAGOGASTRODUODENOSCOPY: SHX5428

## 2022-11-12 HISTORY — PX: SAVORY DILATION: SHX5439

## 2022-11-12 LAB — GLUCOSE, CAPILLARY
Glucose-Capillary: 96 mg/dL (ref 70–99)
Glucose-Capillary: 104 mg/dL — ABNORMAL HIGH (ref 70–99)
Glucose-Capillary: 87 mg/dL (ref 70–99)
Glucose-Capillary: 83 mg/dL (ref 70–99)
Glucose-Capillary: 123 mg/dL — ABNORMAL HIGH (ref 70–99)
Glucose-Capillary: 93 mg/dL (ref 70–99)
Glucose-Capillary: 95 mg/dL (ref 70–99)

## 2022-11-12 LAB — CBC
HCT: 22.5 % — ABNORMAL LOW (ref 39.0–52.0)
Hemoglobin: 7.5 g/dL — ABNORMAL LOW (ref 13.0–17.0)
MCH: 32.1 pg (ref 26.0–34.0)
MCHC: 33.3 g/dL (ref 30.0–36.0)
MCV: 96.2 fL (ref 80.0–100.0)
Platelets: 225 10*3/uL (ref 150–400)
RBC: 2.34 MIL/uL — ABNORMAL LOW (ref 4.22–5.81)
RDW: 16.8 % — ABNORMAL HIGH (ref 11.5–15.5)
WBC: 6 10*3/uL (ref 4.0–10.5)
nRBC: 0 % (ref 0.0–0.2)

## 2022-11-12 LAB — RENAL FUNCTION PANEL
Albumin: 2.6 g/dL — ABNORMAL LOW (ref 3.5–5.0)
Anion gap: 12 (ref 5–15)
BUN: 55 mg/dL — ABNORMAL HIGH (ref 6–20)
CO2: 26 mmol/L (ref 22–32)
Calcium: 8.4 mg/dL — ABNORMAL LOW (ref 8.9–10.3)
Chloride: 90 mmol/L — ABNORMAL LOW (ref 98–111)
Creatinine, Ser: 9.73 mg/dL — ABNORMAL HIGH (ref 0.61–1.24)
GFR, Estimated: 7 mL/min — ABNORMAL LOW (ref >60)
Glucose, Bld: 100 mg/dL — ABNORMAL HIGH (ref 70–99)
Phosphorus: 7.7 mg/dL — ABNORMAL HIGH (ref 2.5–4.6)
Potassium: 4.5 mmol/L (ref 3.5–5.1)
Sodium: 128 mmol/L — ABNORMAL LOW (ref 135–145)

## 2022-11-12 LAB — VALPROIC ACID LEVEL: Valproic Acid Lvl: 21 ug/mL — ABNORMAL LOW (ref 50.0–100.0)

## 2022-11-12 LAB — HEPATITIS B SURFACE ANTIGEN: Hepatitis B Surface Ag: NONREACTIVE

## 2022-11-12 SURGERY — EGD (ESOPHAGOGASTRODUODENOSCOPY)
Anesthesia: Monitor Anesthesia Care

## 2022-11-12 MED ORDER — HALOPERIDOL 5 MG PO TABS: 5.0000 mg | ORAL_TABLET | Freq: Three times a day (TID) | ORAL | Status: DC | PRN

## 2022-11-12 MED ORDER — SODIUM CHLORIDE 0.9 % IV SOLN: INTRAVENOUS | Status: DC

## 2022-11-12 MED ORDER — LIDOCAINE 2% (20 MG/ML) 5 ML SYRINGE
INTRAMUSCULAR | Status: DC | PRN
  Administered 2022-11-12: 60 mg via INTRAVENOUS

## 2022-11-12 MED ORDER — LABETALOL HCL 200 MG PO TABS
100.0000 mg | ORAL_TABLET | Freq: Two times a day (BID) | ORAL | Status: DC
  Administered 2022-11-12 – 2022-11-14 (×4): 100 mg via ORAL
  Filled 2022-11-12 (×4): qty 1

## 2022-11-12 MED ORDER — HALOPERIDOL LACTATE 5 MG/ML IJ SOLN: 5.0000 mg | Freq: Three times a day (TID) | INTRAMUSCULAR | Status: DC | PRN

## 2022-11-12 MED ORDER — DEXTROSE 50 % IV SOLN
INTRAVENOUS | Status: AC
  Administered 2022-11-12: 50 mL
  Filled 2022-11-12: qty 50

## 2022-11-12 MED ORDER — SUCRALFATE 1 G PO TABS
1.0000 g | ORAL_TABLET | Freq: Two times a day (BID) | ORAL | Status: DC
  Administered 2022-11-12 – 2022-11-14 (×5): 1 g via ORAL
  Filled 2022-11-12 (×5): qty 1

## 2022-11-12 MED ORDER — PROPOFOL 10 MG/ML IV BOLUS
INTRAVENOUS | Status: DC | PRN
  Administered 2022-11-12: 30 mg via INTRAVENOUS
  Administered 2022-11-12: 40 mg via INTRAVENOUS
  Administered 2022-11-12: 70 mg via INTRAVENOUS
  Administered 2022-11-12 (×5): 30 mg via INTRAVENOUS

## 2022-11-12 NOTE — Assessment & Plan Note (Signed)
Migraine and vomiting both resolved. -Continue Tylenol -Continue sumatriptan prn -Continue home Depakote

## 2022-11-12 NOTE — Assessment & Plan Note (Signed)
EGD 7/3 with biopsies taken and dilation performed. Instruction per GI as follows: - Advance diet as tolerated.  - Cepacol or Halls Lozenges + / - Chloraseptic spray for next 72- 96 hours to aid in sore throat.  - Continue twice daily PPI 40 mg as ordered.  - Carafate therapy twice daily for at least 2 if not 4 weeks ( with patient' s renal dysfunction, we may not be able to use this as long however) .  - Await pathology results.  - If the patient has no significant improvement, then consider OP esophageal manometry.

## 2022-11-12 NOTE — Evaluation (Signed)
Physical Therapy Evaluation Patient Details Name: [name] MRN: [id number] DOB: [birth year] Today's Date: 11/12/2022  History of Present Illness  37 y.o. male presents to [facility] hospital with AV fistula on 6/30. PMH includes ESRD, Alport syndrome with hearing loss, HTN, bipolar disorder.   Clinical Impression  Pt admitted with above. Pt with inconsistence performance t/o session. Pt tested 5/5 LE MMT however during ambulation was very unsteady reporting "I am so weak." Unsure of accuracy of patien'ts report of support and home. It appears he lives between 2 different homes and a hotel. Pt to benefit from HHPT and PRN supervision upon d/c while patient recovers from hospital stay. Acute PT to cont to follow.        Assistance Recommended at Discharge Frequent or constant Supervision/Assistance  If plan is discharge home, recommend the following:  Can travel by private vehicle  A little help with walking and/or transfers;A little help with bathing/dressing/bathroom;Assist for transportation;Help with stairs or ramp for entrance        Equipment Recommendations None recommended by PT (may benefit from RW if amb stability doesn't improve)  Recommendations for Other Services       Functional Status Assessment Patient has had a recent decline in their functional status and demonstrates the ability to make significant improvements in function in a reasonable and predictable amount of time.     Precautions / Restrictions Precautions Precautions: Fall Restrictions Weight Bearing Restrictions: Yes RUE Weight Bearing: Weight bearing as tolerated      Mobility  Bed Mobility               General bed mobility comments: pt up in chair upon PT arrival    Transfers Overall transfer level: Modified independent Equipment used: None               General transfer comment: pt able to stand up from chair without difficulty    Ambulation/Gait Ambulation/Gait assistance: Min  assist Gait Distance (Feet): 120 Feet Assistive device: None Gait Pattern/deviations: Step-through pattern, Decreased stride length, Wide base of support Gait velocity: dec Gait velocity interpretation: <1.31 ft/sec, indicative of household ambulator   General Gait Details: pt with wide base of support and significant lateral sway with report "Oh Jesus Christ, I'm so weak. My legs are giving out on me."  Stairs            Wheelchair Mobility     Tilt Bed    Modified Rankin (Stroke Patients Only)       Balance Overall balance assessment: Mild deficits observed, not formally tested (pt unsteady during ambulation)                                           Pertinent Vitals/Pain Pain Assessment Pain Assessment: Faces Faces Pain Scale: Hurts little more Pain Location: pt reports "Im weak all over" Pain Intervention(s): Monitored during session    Home Living Family/patient expects to be discharged to:: Private residence                   Additional Comments: per patient he is seperated from his wife and lives between a hotel, his oldest son's mother, and his ex-mother in laws home.    Prior Function Prior Level of Function : Independent/Modified Independent;Driving;Working/employed             Mobility Comments: pt states he was  working for [employer] until he started dialysis       Hand Dominance   Dominant Hand: Right    Extremity/Trunk Assessment   Upper Extremity Assessment Upper Extremity Assessment: Defer to OT evaluation    Lower Extremity Assessment Lower Extremity Assessment: Overall WFL for tasks assessed (pt MMT bilat LE 5/5)    Cervical / Trunk Assessment Cervical / Trunk Assessment: Normal  Communication   Communication: HOH (has bilat hearing aides)  Cognition Arousal/Alertness: Awake/alert Behavior During Therapy: WFL for tasks assessed/performed Overall Cognitive Status: No family/caregiver present to  determine baseline cognitive functioning                                 General Comments: unsure of patient's baseline cognitive function however reports he was working. pt able to follow commands, unsure of consistency with patients performance        General Comments General comments (skin integrity, edema, etc.): VSS, R upper arm swelling    Exercises     Assessment/Plan    PT Assessment Patient needs continued PT services  PT Problem List Decreased strength;Decreased range of motion;Decreased activity tolerance;Decreased balance;Decreased mobility;Decreased coordination       PT Treatment Interventions DME instruction;Gait training;Stair training;Functional mobility training;Therapeutic activities;Therapeutic exercise;Balance training    PT Goals (Current goals can be found in the Care Plan section)  Acute Rehab PT Goals Patient Stated Goal: go to rehab facility PT Goal Formulation: With patient Time For Goal Achievement: 11/26/22 Potential to Achieve Goals: Good Additional Goals Additional Goal #1: Pt to score >19 on DGI to indicate minimal falls risk.    Frequency Min 3X/week     Co-evaluation               AM-PAC PT "6 Clicks" Mobility  Outcome Measure Help needed turning from your back to your side while in a flat bed without using bedrails?: None Help needed moving from lying on your back to sitting on the side of a flat bed without using bedrails?: None Help needed moving to and from a bed to a chair (including a wheelchair)?: None Help needed standing up from a chair using your arms (e.g., wheelchair or bedside chair)?: None Help needed to walk in hospital room?: A Little Help needed climbing 3-5 steps with a railing? : A Little 6 Click Score: 22    End of Session Equipment Utilized During Treatment: Gait belt Activity Tolerance: Patient limited by fatigue Patient left: in chair;with call bell/phone within reach Nurse Communication:  Mobility status PT Visit Diagnosis: Unsteadiness on feet (R26.81);Muscle weakness (generalized) (M62.81);Difficulty in walking, not elsewhere classified (R26.2)    Time: 4098-1191 PT Time Calculation (min) (ACUTE ONLY): 26 min   Charges:   PT Evaluation $PT Eval Moderate Complexity: 1 Mod PT Treatments $Gait Training: 8-22 mins PT General Charges $$ ACUTE PT VISIT: 1 Visit         Lewis Shock, PT, DPT Acute Rehabilitation Services Secure chat preferred Office #: [phone]   Iona Hansen 11/12/2022, 1:47 PM

## 2022-11-12 NOTE — Progress Notes (Addendum)
Progress Note  VASCULAR SURGERY ASSESSMENT & PLAN:  ' END-STAGE RENAL DISEASE: His tunneled dialysis catheter is working well.  He will not be able to use his upper arm access for 2 months.  He is ready to go once his medical issues are resolved.  ESOPHAGEAL STRICTURE: He underwent endoscopy today.  GI noted that "empiric dilation performed. In the next couple of weeks we will know if he feels any significant improvement or not. Added Carafate for a few weeks because he has significant gastritis and duodenitis but usually nephrology is not excited about keeping Carafate therapy on for too long and they are HD patients. Depending on how things go as an outpatient, if he continues to have dysphagia symptoms, then esophageal manometry would be recommended. Will sign off at this time, please call us back if there are any other questions."   HYPERTENSION: Family medicine is managing his blood pressure.  HYPOGLYCEMIA: Family medicine is following his medical issues.  Cari Caraway, MD 10:16 AM     11/12/2022 7:45 AM 5 Days Post-Op  Subjective:  says right arm is feeling better. Feeling shaky this morning asking for his blood sugars to be checked   Vitals:   11/11/22 2004 11/12/22 0543  BP: (!) 180/100 (!) 166/100  Pulse:  76  Resp: 20 20  Temp:  98.4 F (36.9 C)  SpO2: 100%    Physical Exam: Cardiac:  regular Lungs:  non labored Incisions:  right upper arm incisions are intact and well appearing. Good thrill Extremities: 2+ radial pulses, 5/5 grip strength Neurologic: alert and oriented  CBC    Component Value Date/Time   WBC 6.0 11/12/2022 0037   RBC 2.34 (L) 11/12/2022 0037   HGB 7.5 (L) 11/12/2022 0037   HGB 10.4 (L) 03/17/2022 1721   HCT 22.5 (L) 11/12/2022 0037   HCT 30.2 (L) 03/17/2022 1721   PLT 225 11/12/2022 0037   PLT 207 03/17/2022 1721   MCV 96.2 11/12/2022 0037   MCV 87 03/17/2022 1721   MCH 32.1 11/12/2022 0037   MCHC 33.3 11/12/2022 0037   RDW 16.8 (H)  11/12/2022 0037   RDW 14.0 03/17/2022 1721   LYMPHSABS 1.7 10/27/2022 0001   MONOABS 0.6 10/27/2022 0001   EOSABS 0.2 10/27/2022 0001   BASOSABS 0.1 10/27/2022 0001    BMET    Component Value Date/Time   NA 128 (L) 11/12/2022 0037   NA 139 06/16/2022 1606   K 4.5 11/12/2022 0037   CL 90 (L) 11/12/2022 0037   CO2 26 11/12/2022 0037   GLUCOSE 100 (H) 11/12/2022 0037   BUN 55 (H) 11/12/2022 0037   BUN 72 (H) 06/16/2022 1606   CREATININE 9.73 (H) 11/12/2022 0037   CALCIUM 8.4 (L) 11/12/2022 0037   GFRNONAA 7 (L) 11/12/2022 0037   GFRAA 6 (L) 10/26/2019 1032    INR No results found for: "INR"   Intake/Output Summary (Last 24 hours) at 11/12/2022 0745 Last data filed at 11/11/2022 1206 Gross per 24 hour  Intake --  Output 2500 ml  Net -2500 ml     Assessment/Plan:  37 y.o. male is s/p right internal jugular TDC placement and excision of large aneurysmal right upper arm fistula with revision using a PTFE graft   5 Days Post-Op   RUE Incisions are intact and well appearing Continue to elevate right arm  Esophageal stricture. GI consulted for possible EGD Pain control PRN BP remains elevated Having episodes of hypoglycemia HD TTS. will need dialysis again  tomorrow? Not sure with holiday schedule  Appreciate [facility] and Nephrology assistance  Order placed for PT/OT eval   Graceann Congress, [facility] [phone] 11/12/2022 7:45 AM
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
## 2022-11-12 NOTE — Assessment & Plan Note (Addendum)
Downtrending troponins. - arrange cardiology outpatient followup  Patient reported chest discomfort and difficulty swallowing.  EGD today showed changes at GE junction. Biopsies were taken and dilation performed.

## 2022-11-12 NOTE — Progress Notes (Signed)
  [facility] Progress Note   Subjective:  Seen in room - s/p esophageal dilation this morning, eating soft foods without issues. R arm edema is much less today.   Objective Vitals:   11/12/22 0850 11/12/22 0900 11/12/22 0910 11/12/22 1107  BP: (!) 176/88 (!) 171/95 (!) 183/92 (!) 144/90  Pulse: 76 74 77 76  Resp: (!) 22 15 15 17   Temp: 97.9 F (36.6 C)   98 F (36.7 C)  TempSrc: Temporal   Oral  SpO2: 96% 97% 97% 100%  Weight:      Height:       Physical Exam General: NAD, Room air. Heart: RRR; no murmur Lungs: CTAB Abdomen: soft Extremities: no LE edema, RUE with edema and + bruit heard -> improving Dialysis Access: [facility] in R chest  Additional Objective Labs: Basic Metabolic Panel: Recent Labs  Lab 11/08/22 1134 11/10/22 0904 11/12/22 0037  NA 137 131* 128*  K 3.9 4.3 4.5  CL 93* 90* 90*  CO2 25 25 26   GLUCOSE 83 98 100*  BUN 46* 65* 55*  CREATININE 8.82* 11.70* 9.73*  CALCIUM 9.7 8.6* 8.4*  PHOS 7.1*  --  7.7*   Liver Function Tests: Recent Labs  Lab 11/08/22 1134 11/12/22 0037  ALBUMIN 3.2* 2.6*   CBC: Recent Labs  Lab 11/08/22 1134 11/09/22 0126 11/10/22 0904 11/12/22 0037  WBC 7.9 7.3 6.9 6.0  HGB 7.9* 7.7* 8.4* 7.5*  HCT 24.7* 24.4* 25.7* 22.5*  MCV 98.0 100.4* 98.1 96.2  PLT 209 213 251 225   Medications:  sodium chloride      amLODipine  10 mg Oral Daily   calcium acetate  667 mg Oral TID WC   calcium carbonate  1 tablet Oral TID WC   Chlorhexidine Gluconate Cloth  6 each Topical Q0600   Chlorhexidine Gluconate Cloth  6 each Topical Q0600   cloNIDine  0.3 mg Oral TID   divalproex  500 mg Oral QHS   feeding supplement  237 mL Oral BID BM   ferric citrate  210 mg Oral TID WC   hydrALAZINE  100 mg Oral TID   labetalol  100 mg Oral BID   losartan  100 mg Oral Daily   pantoprazole  40 mg Oral BID   sucralfate  1 g Oral BID    Dialysis Orders: TTS at [facility], 400/A1.5, EDW 73.5kg, 2K/2Ca, AVF -> now TDC, heparin  1200U bolus - Mircera IV q 2 weeks - last 6/25 - due 7/9. 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 ESRD: S/p extra HD 6/28, then back to usual. Next HD 7/4 - tomorrow. HTN/volume: BP high, prior pulm edema on CXR resolved. Continue home meds, lower EDW as tolerated. Anemia of ESRD: In addition to post-op drop, not due for ESA yet. Secondary hyperparathyroidism: Ca ok, Phos high - continue home binders. Nutrition:  Alb low, continue renal diet. Hearing loss  Ozzie Hoyle, Cordelia Poche 11/12/2022, 12:51 PM  [employer]

## 2022-11-12 NOTE — Assessment & Plan Note (Signed)
S/p [facility] placement and excision of right arm aneurysmal fistula with revision with VVS on 6/28.  Nephrology managing HD. Has HD TTS. -Continue plans per VVS and nephrology

## 2022-11-12 NOTE — Assessment & Plan Note (Addendum)
Baseline BP 150-200/100-140.  BP improved with increased clonidine and additional dose of losartan in addition to hydralazine 100 mg (home dose). BP ranging 180s/100s. Would benefit from b-blocker for both HTN and HF.  -Continue home amlodipine 10 mg daily, hydralazine 100 mg TID -Continue clonidine to 0.3 mg TID ( prepped to send to pharmacy at d/c) -Continue losartan to 100mg  daily - transition metoprolol to labetalol 100mg  BID - goal BP systolic 160s-170 and diastolic 100s

## 2022-11-12 NOTE — Consult Note (Signed)
Daily Progress Note Intern Pager: [phone]  Patient name: [name] Medical record number: [id number] Date of birth: 19-Jul-1985 Age: 37 y.o. Gender: male  Primary Care Provider: [name], DO Admitting: VVS Code Status: FULL   Assessment and Plan: ELISANDRO WYKA is a 37 y.o. male admitted by VVS, for TCD placement and excision of aneurysmal fistula with revision which was completed on 6/28.  After HD on 6/29 BP became elevated in 200s/100s and FMTS consulted for blood pressure management. PMHx significant for HTN, HFmrEF, Alport syndrome, ESRD on HD TTS.  Hospital Problem List      Hospital     * (Principal) A-V fistula [facility])     Severe uncontrolled hypertension     Baseline BP 150-200/100-140.  BP improved with increased clonidine and  additional dose of losartan in addition to hydralazine 100 mg (home dose).  BP ranging 180s/100s. Would benefit from b-blocker for both HTN and HF.  -Continue home amlodipine 10 mg daily, hydralazine 100 mg TID -Continue clonidine to 0.3 mg TID ( prepped to send to pharmacy at d/c) -Continue losartan to 100mg  daily - transition metoprolol to labetalol 100mg  BID - goal BP systolic 160s-170 and diastolic 100s         ESRD (end stage renal disease) (HCC)     S/p TDC placement and excision of right arm aneurysmal fistula with  revision with VVS on 6/28.  Nephrology managing HD. Has HD TTS. -Continue plans per VVS and nephrology         Chest pain     Downtrending troponins. - arrange cardiology outpatient followup  Patient reported chest discomfort and difficulty swallowing.  EGD today  showed changes at GE junction. Biopsies were taken and dilation performed.        Heart failure with mildly reduced ejection fraction (HFmrEF) (HCC)     Echo 06/03/22 w/ EF 55%. CXR 6/29 showing cardiomegaly and vascular  congestion. Pt tachycardic. Prefers to try metoprolol as he previously  tried coreg and had side effects (numbness of  mouth). 11/10/22: CXR with moderate cardiomegaly. EKG shows nonspecific T wave  abnormality new from last EKG.  -Cardiology recommends serial troponins, will follow up on this -lopressor 12.5 mg BID -Recommend f/u with cardiology out patient         Hypertensive crisis     BP still elevated, continue dialysis today. Home meds include  amlodipine 10 mg daily, coreg 25 mg twice daily and hydralazine 50 mg 3  times daily, Imdur 30 mg daily. - Continue home medications - prn labetalol until he receives HD - Monitor BP        Migraines     Migraine and vomiting both resolved. -Continue Tylenol -Continue sumatriptan prn -Continue home Depakote        ESRD on dialysis (HCC)     Aneurysm of arteriovenous dialysis fistula (HCC)     Macrocytic anemia     Hgb 9.9>7.9>7.7 s/p surgery. Transfusion threshold <7. Of note dose  have hx of lower GI bleed, previous colonoscopy showing diverticulosis. No  blood per rectum during hospitalization per pt.  -recheck CBC out pt at family medicine clinic  -Consider iron studies        Chronic diastolic CHF (congestive heart failure) (HCC)     Aneurysm of arteriovenous fistula (HCC)     Resistant hypertension     Acute cough     Esophageal dysphagia     EGD 7/3 with biopsies taken and  dilation performed. Instruction per GI as follows: - Advance diet as tolerated.  - Cepacol or Halls Lozenges + / - Chloraseptic spray for next 72- 96 hours  to aid in sore throat.  - Continue twice daily PPI 40 mg as ordered.  - Carafate therapy twice daily for at least 2 if not 4 weeks ( with  patient' s renal dysfunction, we may not be able to use this as long  however) .  - Await pathology results.  - If the patient has no significant improvement, then consider OP  esophageal manometry.        Abnormal barium swallow     Anemia of chronic disease     Hgb 7.5 on 7/3 down from 8.4 2 days earlier. Goal Hgb >7. -recheck CBC out pt at family medicine clinic   -Consider iron studies       FEN/GI: Renal diet PPx: SCDs Dispo:Home pending clinical improvement . Barriers include BP stability.   Subjective:  Patient seen today in his room initially sleeping, but awoke to voice and gentle touch.  He states he is doing well but asks if he can have "different food."  Otherwise he is without complaint.  Objective: Temp:  [97.9 F (36.6 C)-98.4 F (36.9 C)] 98 F (36.7 C) (07/03 1107) Pulse Rate:  [74-96] 76 (07/03 1107) Resp:  [12-22] 17 (07/03 1107) BP: (144-189)/(88-120) 144/90 (07/03 1107) SpO2:  [96 %-100 %] 100 % (07/03 1107) Weight:  [73.1 kg] 73.1 kg (07/03 0809) Physical Exam: General: Well-appearing, no acute distress Cardio: RRR Pulm: CTA bilaterally no increased work of breathing Abdominal: soft, non-tender, non-distended Extremities: no peripheral edema  Neuro: alert and oriented x3, speech normal in content Psych:  Cognition and judgment appear intact. Alert, communicative  and cooperative.  Laboratory: Most recent CBC Lab Results  Component Value Date   WBC 6.0 11/12/2022   HGB 7.5 (L) 11/12/2022   HCT 22.5 (L) 11/12/2022   MCV 96.2 11/12/2022   PLT 225 11/12/2022   Most recent BMP    Latest Ref Rng & Units 11/12/2022   12:37 AM  BMP  Glucose 70 - 99 mg/dL 161   BUN 6 - 20 mg/dL 55   Creatinine 0.96 - 1.24 mg/dL 0.45   Sodium 409 - 811 mmol/L 128   Potassium 3.5 - 5.1 mmol/L 4.5   Chloride 98 - 111 mmol/L 90   CO2 22 - 32 mmol/L 26   Calcium 8.9 - 10.3 mg/dL 8.4    01/10/46 Endoscopy Changes seen at GE junction. Biopsies taken. Dilation performed.  [name], DO 11/12/2022, 1:02 PM  PGY-1, [facility] FPTS Intern pager: [phone], text pages welcome Secure chat group [facility] Teaching Service

## 2022-11-12 NOTE — Assessment & Plan Note (Addendum)
Hgb 7.5 on 7/3 down from 8.4 2 days earlier. Goal Hgb >7. -recheck CBC out pt at family medicine clinic  -Consider iron studies

## 2022-11-12 NOTE — Progress Notes (Signed)
Called to patient's room and he describes a feeling of lethargy and states that he thinks his blood sugar is too low. Patient's glucometer reading is 87. Treated NPO patient with amp of D50 for symptomatic hypoglycemia. Will continue to monitor.

## 2022-11-12 NOTE — Progress Notes (Signed)
OT Cancellation Note  Patient Details Name: [name] MRN: [id number] DOB: [birth year]   Cancelled Treatment:    Reason Eval/Treat Not Completed: Patient at procedure or test/ unavailable (EGD for dysphagia)  Donia Pounds 11/12/2022, 8:13 AM

## 2022-11-12 NOTE — Consult Note (Signed)
Attending Physician's Attestation   I have taken an interval history, reviewed the chart and examined the patient.   Please see separate consult note by PA E[place]wood for full details of HPI.  Patient we are asked to evaluate for abnormal barium swallow and dysphagia symptoms.  Recent EGD for other issues did not suggest stricture, but barium swallow does currently.  Will plan for EGD this admission for further evaluation.  He may have issues with dysmotility that may need to be considered if empiric dilation does not help the patient.  The risks and benefits of endoscopic evaluation were discussed with the patient; these include but are not limited to the risk of perforation, infection, bleeding, missed lesions, lack of diagnosis, severe illness requiring hospitalization, as well as anesthesia and sedation related illnesses.  The patient and/or family is agreeable to proceed.  We will see how his blood pressure is tomorrow.  I agree with the Advanced Practitioner's note, impression, and recommendations with updates and my documentation as noted above.  The majority of the medical decision making/process, formulation of the impression/plan of action for the patient were performed by me with substantive portion of this encounter (>50% time spent including complete performance of at least one of the key components of MDM, History, and/or Exam).   Corliss Parish, MD [facility]oscopy Office # [phone]

## 2022-11-12 NOTE — Consult Note (Signed)
Brief Psychiatry Consult Note  I am acknowledging consult, unfortunately am at [facility] campus. Have reviewed most recent EKG which is without  evidence for significant Qtc prolongation. I have put in agitation orders and a  nursing care order asking for nurses not to go in the room alone. Apparently had an episode of agitation this weekend as well. Also put in a depakote level for this PM (has gotten a few doses today).  NC1.    [name] A [name]

## 2022-11-12 NOTE — Anesthesia Postprocedure Evaluation (Signed)
Anesthesia Post Note  Patient: [name]  Procedure(s) Performed: ESOPHAGOGASTRODUODENOSCOPY (EGD) BIOPSY SAVORY DILATION     Patient location during evaluation: PACU Anesthesia Type: MAC Level of consciousness: awake and alert Pain management: pain level controlled Vital Signs Assessment: post-procedure vital signs reviewed and stable Respiratory status: spontaneous breathing, nonlabored ventilation and respiratory function stable Cardiovascular status: stable and blood pressure returned to baseline Anesthetic complications: no   No notable events documented.  Last Vitals:  Vitals:   11/12/22 0900 11/12/22 0910  BP: (!) 171/95 (!) 183/92  Pulse: 74 77  Resp: 15 15  Temp:    SpO2: 97% 97%    Last Pain:  Vitals:   11/12/22 0910  TempSrc:   PainSc: 0-No pain                 Beryle Lathe

## 2022-11-12 NOTE — Care Management Important Message (Signed)
Important Message  Patient Details  Name: [name] MRN: [id number] Date of Birth: 1985-10-09   Medicare Important Message Given:  Yes     Renie Ora 11/12/2022, 8:37 AM

## 2022-11-12 NOTE — Op Note (Signed)
[facility] Patient Name: [name] Procedure Date : 11/12/2022 MRN: [id number] Attending MD: Corliss Parish , MD, [id number] Date of Birth: [birth year] CSN: [id number] Age: 37 Admit Type: Inpatient Procedure:                Upper GI endoscopy Indications:              Dysphagia, Abnormal UGI series Providers:                Corliss Parish, MD, Stephens Shire RN, RN, Eliberto Ivory, RN, Leanne Lovely, Technician Referring MD:             Inpatient medical service Medicines:                Monitored Anesthesia Care Complications:            No immediate complications. Estimated Blood Loss:     Estimated blood loss was minimal. Procedure:                Pre-Anesthesia Assessment:                           - Prior to the procedure, a History and Physical                            was performed, and patient medications and                            allergies were reviewed. The patient's tolerance of                            previous anesthesia was also reviewed. The risks                            and benefits of the procedure and the sedation                            options and risks were discussed with the patient.                            All questions were answered, and informed consent                            was obtained. Prior Anticoagulants: The patient has                            taken no anticoagulant or antiplatelet agents. ASA                            Grade Assessment: III - A patient with severe                            systemic disease. After reviewing the risks and  benefits, the patient was deemed in satisfactory                            condition to undergo the procedure.                           After obtaining informed consent, the endoscope was                            passed under direct vision. Throughout the                            procedure, the patient's blood  pressure, pulse, and                            oxygen saturations were monitored continuously. The                            [id number]-[id number] ([id number]) Olympus endoscope was introduced                            through the mouth, and advanced to the second part                            of duodenum. The upper GI endoscopy was                            accomplished without difficulty. The patient                            tolerated the procedure. Scope In: Scope Out: Findings:      No gross lesions were noted in the entire esophagus. Biopsies were taken       with a cold forceps for histology to rule out EOE/LOE. After the rest of       the EGD was completed, a guidewire was placed and the scope was       withdrawn. Dilation was performed with a Savary dilator with no       resistance at 18 mm. The dilation site was examined following endoscope       reinsertion and showed no change.      The Z-line was irregular and was found 41 cm from the incisors.      Localized moderate mucosal changes characterized by granularity,       scalloping and altered texture were found just below the       gastroesophageal junction within the cardia. Biopsies were taken with a       cold forceps for histology.      A 3 cm hiatal hernia was present.      Patchy moderate inflammation characterized by erosions, erythema and       friability was found in the entire examined stomach. Biopsies were taken       with a cold forceps for histology and Helicobacter pylori testing.      Patchy mild inflammation characterized by congestion (edema) and       erythema was found in the duodenal bulb, in the first portion  of the       duodenum and in the second portion of the duodenum. Biopsies were taken       with a cold forceps for histology. Impression:               - No gross lesions in the entire esophagus.                            Biopsied. Dilated (no mucosal wrenting noted).                           - Z-line  irregular, 41 cm from the incisors.                           - Granular, scalloped and texture changed mucosa                            just below the GEJxn within cardia. Biopsied.                           - 3 cm hiatal hernia.                           - Gastritis. Biopsied.                           - Duodenitis and congestion. Biopsied. Recommendation:           - The patient will be observed post-procedure,                            until all discharge criteria are met.                           - Return patient to hospital ward for ongoing care.                           - Advance diet as tolerated.                           - Please use Cepacol or Halls Lozenges +/-                            Chloraseptic spray for next 72-96 hours to aid in                            sore thoat should you experience this.                           - Continue twice daily PPI 40 mg as ordered.                           - Recommend Carafate therapy twice daily for at                            least 2 if  not 4 weeks (with patient's renal                            dysfunction, we may not be able to use this as long                            however).                           - Observe patient's clinical course.                           - Await pathology results.                           - Pending how the patient does in regards to his                            dysphagia symptoms over the next few weeks, if he                            has good improvement with empiric dilation, then he                            may not need additional workup or management. If                            the patient has no significant improvement, then                            consideration of outpatient esophageal manometry is                            reasonable.                           - The findings and recommendations were discussed                            with the patient.                            - The findings and recommendations were discussed                            with the referring physician. Procedure Code(s):        --- Professional ---                           [phone], Esophagogastroduodenoscopy, flexible,                            transoral; with insertion of guide wire followed by  passage of dilator(s) through esophagus over guide                            wire                           43239, 59, Esophagogastroduodenoscopy, flexible,                            transoral; with biopsy, single or multiple Diagnosis Code(s):        --- Professional ---                           K22.89, Other specified disease of esophagus                           K31.89, Other diseases of stomach and duodenum                           K44.9, Diaphragmatic hernia without obstruction or                            gangrene                           K29.70, Gastritis, unspecified, without bleeding                           K29.80, Duodenitis without bleeding                           R13.10, Dysphagia, unspecified                           R93.3, Abnormal findings on diagnostic imaging of                            other parts of digestive tract CPT copyright 2022 [employer]. All rights reserved. The codes documented in this report are preliminary and upon coder review may  be revised to meet current compliance requirements. Corliss Parish, MD 11/12/2022 9:00:57 AM Number of Addenda: 0

## 2022-11-12 NOTE — H&P (Signed)
GASTROENTEROLOGY PROCEDURE H&P NOTE   Primary Care Physician: [name], DO  HPI: [name] is a 37 y.o. male who presents for EGD for dysphagia.  Past Medical History:  Diagnosis Date   Bipolar 1 disorder (HCC)    Depression    ESRD on hemodialysis (HCC)    HD at [facility] on TTS schedule   GERD (gastroesophageal reflux disease)    Hearing difficulty of left ear    75% hearing   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   Hypertension    Low blood sugar    Past Surgical History:  Procedure Laterality Date   A/V FISTULAGRAM Right 10/27/2022   Procedure: A/V Fistulagram;  Surgeon: Chuck Hint, MD;  Location: [facility] INVASIVE CV LAB;  Service: Cardiovascular;  Laterality: Right;   APPENDECTOMY     AV FISTULA PLACEMENT Left 03/03/2019   Procedure: ARTERIOVENOUS (AV) FISTULA CREATION LEFT ARM;  Surgeon: Maeola Harman, MD;  Location: [facility] OR;  Service: Vascular;  Laterality: Left;   BASCILIC VEIN TRANSPOSITION Right 04/12/2019   Procedure: RIGHT UPPER EXTREMITY BASCILIC VEIN TRANSPOSITION FIRST STAGE FISTULA;  Surgeon: Chuck Hint, MD;  Location: [facility] OR;  Service: Vascular;  Laterality: Right;   BIOPSY  10/14/2021   Procedure: BIOPSY;  Surgeon: Sherrilyn Rist, MD;  Location: [facility] ENDOSCOPY;  Service: Gastroenterology;;   BIOPSY  06/06/2022   Procedure: BIOPSY;  Surgeon: Napoleon Form, MD;  Location: [facility] ENDOSCOPY;  Service: Gastroenterology;;   COLONOSCOPY WITH PROPOFOL N/A 10/14/2021   Procedure: COLONOSCOPY WITH PROPOFOL;  Surgeon: Sherrilyn Rist, MD;  Location: [facility] ENDOSCOPY;  Service: Gastroenterology;  Laterality: N/A;   ESOPHAGOGASTRODUODENOSCOPY (EGD) WITH PROPOFOL N/A 06/06/2022   Procedure: ESOPHAGOGASTRODUODENOSCOPY (EGD) WITH PROPOFOL;  Surgeon: Napoleon Form, MD;  Location: [facility] ENDOSCOPY;  Service: Gastroenterology;  Laterality: N/A;   FISTULA SUPERFICIALIZATION Right 11/07/2022   Procedure: PLICATION OF LARGE ANEURYSMS OF RIGHT  ARTERIOVENOUS FISTULA WITH INSERTION OF 7mm INTERPOSITIONAL GORTEX GRAFT;  Surgeon: Chuck Hint, MD;  Location: [facility] OR;  Service: Vascular;  Laterality: Right;   FLEXIBLE SIGMOIDOSCOPY N/A 04/14/2022   Procedure: FLEXIBLE SIGMOIDOSCOPY;  Surgeon: Napoleon Form, MD;  Location: [facility] ENDOSCOPY;  Service: Gastroenterology;  Laterality: N/A;   INSERTION OF DIALYSIS CATHETER N/A 11/07/2022   Procedure: INSERTION OF RIGHT INTERNAL JUGULAR TUNNELED DIALYSIS CATHETER;  Surgeon: Chuck Hint, MD;  Location: [facility] OR;  Service: Vascular;  Laterality: N/A;   LIGATION OF ARTERIOVENOUS  FISTULA Left 03/06/2019   Procedure: LIGATION OF ARTERIOVENOUS  FISTULA;  Surgeon: Cephus Shelling, MD;  Location: [facility] OR;  Service: Vascular;  Laterality: Left;   PERIPHERAL VASCULAR BALLOON ANGIOPLASTY  10/27/2022   Procedure: PERIPHERAL VASCULAR BALLOON ANGIOPLASTY;  Surgeon: Chuck Hint, MD;  Location: [facility] INVASIVE CV LAB;  Service: Cardiovascular;;  rt upper arm fistula   spinal tap     SPINE SURGERY     related to a spinal infection, unsure of surgery or infection source   WISDOM TOOTH EXTRACTION     Current Facility-Administered Medications  Medication Dose Route Frequency Provider Last Rate Last Admin   [MAR Hold] acetaminophen (TYLENOL) tablet 650 mg  650 mg Oral Q6H PRN Nada Libman, MD   650 mg at 11/08/22 1948   [MAR Hold] amLODipine (NORVASC) tablet 10 mg  10 mg Oral Daily Nada Libman, MD   10 mg at 11/11/22 1715   [MAR Hold] calcium acetate (PHOSLO) capsule 667 mg  667  mg Oral TID WC Nada Libman, MD   667 mg at 11/11/22 1715   [MAR Hold] calcium carbonate (TUMS - dosed in mg elemental calcium) chewable tablet 200 mg of elemental calcium  1 tablet Oral TID WC Glendale Chard, DO   200 mg of elemental calcium at 11/11/22 1539   [MAR Hold] Chlorhexidine Gluconate Cloth 2 % PADS 6 each  6 each Topical Q0600 Delano Metz, MD   6 each at 11/11/22 0547   [MAR Hold]  Chlorhexidine Gluconate Cloth 2 % PADS 6 each  6 each Topical Q0600 Delano Metz, MD   6 each at 11/08/22 1123   [MAR Hold] cloNIDine (CATAPRES) tablet 0.3 mg  0.3 mg Oral TID Glendale Chard, DO   0.3 mg at 11/11/22 2041   [MAR Hold] divalproex (DEPAKOTE ER) 24 hr tablet 500 mg  500 mg Oral QHS Nada Libman, MD   500 mg at 11/11/22 2044   [MAR Hold] feeding supplement (ENSURE ENLIVE / ENSURE PLUS) liquid 237 mL  237 mL Oral BID BM Glendale Chard, DO   237 mL at 11/11/22 2000   [MAR Hold] ferric citrate (AURYXIA) tablet 210 mg  210 mg Oral TID WC Nada Libman, MD   210 mg at 11/11/22 1715   [MAR Hold] guaiFENesin-dextromethorphan (ROBITUSSIN DM) 100-10 MG/5ML syrup 15 mL  15 mL Oral Q4H PRN Nada Libman, MD       [facility] Hold] hydrALAZINE (APRESOLINE) tablet 100 mg  100 mg Oral TID Nada Libman, MD   100 mg at 11/11/22 2044   [MAR Hold] losartan (COZAAR) tablet 100 mg  100 mg Oral Daily Glendale Chard, DO   100 mg at 11/11/22 1715   [MAR Hold] metoprolol tartrate (LOPRESSOR) tablet 25 mg  25 mg Oral BID Glendale Chard, DO   25 mg at 11/11/22 2043   [MAR Hold] ondansetron (ZOFRAN) injection 4 mg  4 mg Intravenous Q6H PRN Nada Libman, MD   4 mg at 11/08/22 2032   [facility] Hold] oxyCODONE-acetaminophen (PERCOCET/ROXICET) 5-325 MG per tablet 1-2 tablet  1-2 tablet Oral Q4H PRN Nada Libman, MD   2 tablet at 11/11/22 2122   [facility] Hold] oxyCODONE-acetaminophen (PERCOCET/ROXICET) 5-325 MG per tablet 2 tablet  2 tablet Oral Q6H PRN Nada Libman, MD   2 tablet at 11/10/22 2103   [MAR Hold] pantoprazole (PROTONIX) EC tablet 40 mg  40 mg Oral BID Nada Libman, MD   40 mg at 11/11/22 2042   [MAR Hold] phenol (CHLORASEPTIC) mouth spray 1 spray  1 spray Mouth/Throat PRN Nada Libman, MD       [facility] Hold] sodium chloride 0.9 % bolus 250 mL  250 mL Intravenous Once Delano Metz, MD       [facility] Hold] SUMAtriptan (IMITREX) tablet 50 mg  50 mg Oral Q2H PRN Nada Libman, MD   50 mg at  11/11/22 2042   [MAR Hold] zolpidem (AMBIEN) tablet 5 mg  5 mg Oral QHS PRN,MR X 1 Nada Libman, MD   5 mg at 11/09/22 2104    Current Facility-Administered Medications:    [MAR Hold] acetaminophen (TYLENOL) tablet 650 mg, 650 mg, Oral, Q6H PRN, Nada Libman, MD, 650 mg at 11/08/22 1948   [MAR Hold] amLODipine (NORVASC) tablet 10 mg, 10 mg, Oral, Daily, Nada Libman, MD, 10 mg at 11/11/22 1715   [MAR Hold] calcium acetate (PHOSLO) capsule 667 mg, 667 mg, Oral, TID WC, Nada Libman, MD,  667 mg at 11/11/22 1715   [MAR Hold] calcium carbonate (TUMS - dosed in mg elemental calcium) chewable tablet 200 mg of elemental calcium, 1 tablet, Oral, TID WC, Miller, Emily, DO, 200 mg of elemental calcium at 11/11/22 1539   [MAR Hold] Chlorhexidine Gluconate Cloth 2 % PADS 6 each, 6 each, Topical, Q0600, Delano Metz, MD, 6 each at 11/11/22 0547   [MAR Hold] Chlorhexidine Gluconate Cloth 2 % PADS 6 each, 6 each, Topical, Q0600, Delano Metz, MD, 6 each at 11/08/22 1123   [MAR Hold] cloNIDine (CATAPRES) tablet 0.3 mg, 0.3 mg, Oral, TID, Glendale Chard, DO, 0.3 mg at 11/11/22 2041   [facility] Hold] divalproex (DEPAKOTE ER) 24 hr tablet 500 mg, 500 mg, Oral, QHS, Nada Libman, MD, 500 mg at 11/11/22 2044   [facility] Hold] feeding supplement (ENSURE ENLIVE / ENSURE PLUS) liquid 237 mL, 237 mL, Oral, BID BM, Glendale Chard, DO, 237 mL at 11/11/22 2000   [MAR Hold] ferric citrate (AURYXIA) tablet 210 mg, 210 mg, Oral, TID WC, Nada Libman, MD, 210 mg at 11/11/22 1715   [MAR Hold] guaiFENesin-dextromethorphan (ROBITUSSIN DM) 100-10 MG/5ML syrup 15 mL, 15 mL, Oral, Q4H PRN, Nada Libman, MD   [MAR Hold] hydrALAZINE (APRESOLINE) tablet 100 mg, 100 mg, Oral, TID, Nada Libman, MD, 100 mg at 11/11/22 2044   [facility] Hold] losartan (COZAAR) tablet 100 mg, 100 mg, Oral, Daily, Glendale Chard, DO, 100 mg at 11/11/22 1715   [MAR Hold] metoprolol tartrate (LOPRESSOR) tablet 25 mg, 25 mg, Oral, BID, Glendale Chard, DO, 25 mg at 11/11/22 2043   [facility] Hold] ondansetron (ZOFRAN) injection 4 mg, 4 mg, Intravenous, Q6H PRN, Nada Libman, MD, 4 mg at 11/08/22 2032   [facility] Hold] oxyCODONE-acetaminophen (PERCOCET/ROXICET) 5-325 MG per tablet 1-2 tablet, 1-2 tablet, Oral, Q4H PRN, Nada Libman, MD, 2 tablet at 11/11/22 2122   [facility] Hold] oxyCODONE-acetaminophen (PERCOCET/ROXICET) 5-325 MG per tablet 2 tablet, 2 tablet, Oral, Q6H PRN, Nada Libman, MD, 2 tablet at 11/10/22 2103   [MAR Hold] pantoprazole (PROTONIX) EC tablet 40 mg, 40 mg, Oral, BID, Nada Libman, MD, 40 mg at 11/11/22 2042   [facility] Hold] phenol (CHLORASEPTIC) mouth spray 1 spray, 1 spray, Mouth/Throat, PRN, Nada Libman, MD   [facility] Hold] sodium chloride 0.9 % bolus 250 mL, 250 mL, Intravenous, Once, Delano Metz, MD   [facility] Hold] SUMAtriptan (IMITREX) tablet 50 mg, 50 mg, Oral, Q2H PRN, Nada Libman, MD, 50 mg at 11/11/22 2042   [facility] Hold] zolpidem (AMBIEN) tablet 5 mg, 5 mg, Oral, QHS PRN,MR X 1, Brabham, Fran Lowes, MD, 5 mg at 11/09/22 2104 Allergies  Allergen Reactions   Zestril [Lisinopril] Swelling   Nsaids Other (See Comments)    "Kidney problems "   Carvedilol Other (See Comments)    Makes mouth numb   Risperdal [Risperidone] Other (See Comments)    Unknown reaction    Family History  Problem Relation Age of Onset   Hypertension Mother    Kidney failure Mother    Diabetes Mother    Hearing loss Father    Hypertension Sister    Heart disease Maternal Grandmother    Stroke Maternal Grandfather    Asthma Son    Social History   Socioeconomic History   Marital status: Married    Spouse name: Not on file   Number of children: 3   Years of education: Not on file   Highest education level: Not on file  Occupational History   Occupation: HVAC  Tobacco Use   Smoking status: Every Day    Packs/day: .3    Types: Cigarettes    Last attempt to quit: 02/27/2016    Years since quitting: 6.7   Smokeless  tobacco: Never   Tobacco comments:    Returned to smoking 2 black and milds per day  Vaping Use   Vaping Use: Never used  Substance and Sexual Activity   Alcohol use: Not Currently    Comment: rare   Drug use: Yes    Types: Marijuana   Sexual activity: Not Currently  Other Topics Concern   Not on file  Social History Narrative   Not on file   Social Determinants of Health   Financial Resource Strain: Not on file  Food Insecurity: No Food Insecurity (09/23/2022)   Hunger Vital Sign    Worried About Running Out of Food in the Last Year: Never true    Ran Out of Food in the Last Year: Never true  Transportation Needs: No Transportation Needs (09/23/2022)   PRAPARE - Administrator, Civil Service (Medical): No    Lack of Transportation (Non-Medical): No  Physical Activity: Not on file  Stress: Not on file  Social Connections: Not on file  Intimate Partner Violence: Not At Risk (09/23/2022)   Humiliation, Afraid, Rape, and Kick questionnaire    Fear of Current or Ex-Partner: No    Emotionally Abused: No    Physically Abused: No    Sexually Abused: No    Physical Exam: Today's Vitals   11/11/22 1623 11/11/22 1911 11/11/22 2004 11/12/22 0543  BP: (!) 189/120  (!) 180/100 (!) 166/100  Pulse: 96   76  Resp: 17  20 20   Temp: 98.1 F (36.7 C)   98.4 F (36.9 C)  TempSrc: Oral   Oral  SpO2: 100%  100%   Weight:      Height:      PainSc:  0-No pain     Body mass index is 24.5 kg/m. GEN: NAD EYE: Sclerae anicteric ENT: MMM CV: Non-tachycardic GI: Soft, NT/ND NEURO:  Alert & Oriented x 3  Lab Results: Recent Labs    11/10/22 0904 11/12/22 0037  WBC 6.9 6.0  HGB 8.4* 7.5*  HCT 25.7* 22.5*  PLT 251 225   BMET Recent Labs    11/10/22 0904 11/12/22 0037  NA 131* 128*  K 4.3 4.5  CL 90* 90*  CO2 25 26  GLUCOSE 98 100*  BUN 65* 55*  CREATININE 11.70* 9.73*  CALCIUM 8.6* 8.4*   LFT Recent Labs    11/12/22 0037  ALBUMIN 2.6*   PT/INR No  results for input(s): "LABPROT", "INR" in the last 72 hours.   Impression / Plan: This is a 37 y.o.male  who presents for EGD for dysphagia.  The risks and benefits of endoscopic evaluation/treatment were discussed with the patient and/or family; these include but are not limited to the risk of perforation, infection, bleeding, missed lesions, lack of diagnosis, severe illness requiring hospitalization, as well as anesthesia and sedation related illnesses.  The patient's history has been reviewed, patient examined, no change in status, and deemed stable for procedure.  The patient and/or family is agreeable to proceed.    Corliss Parish, MD [facility]oscopy Office # [phone]

## 2022-11-12 NOTE — Assessment & Plan Note (Signed)
Hgb 9.9>7.9>7.7 s/p surgery. Transfusion threshold <7. Of note dose have hx of lower GI bleed, previous colonoscopy showing diverticulosis. No blood per rectum during hospitalization per pt.  -recheck CBC out pt at family medicine clinic  -Consider iron studies

## 2022-11-12 NOTE — Evaluation (Signed)
Occupational Therapy Evaluation Patient Details Name: [name] MRN: [id number] DOB: May 27, 1985 Today's Date: 11/12/2022   History of Present Illness 37 y.o. male presents to [facility] hospital with AV fistula on 6/30. PMH includes ESRD, [place] syndrome with hearing loss, HTN, bipolar disorder.   Clinical Impression   [name] was evaluated s/p the above admission list. 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 Pt reports he is in between houses/hotels/his car due to a recent separating with his wife. Upon evaluation the pt was limited by generalized weakness, limited insight and decreased activity tolerance. Overall he needed supervision A - min G for all aspects of functional transfers and mobility. He tolerated standing at the sink for >5 minutes without a significant LOB or safety concerns. Due to the deficits listed below the pt also needs up to min G fro ADLs with cues for cognition. Pt will benefit from continued acute OT services and HHOT.        Recommendations for follow up therapy are one component of a multi-disciplinary discharge planning process, led by the attending physician.  Recommendations may be updated based on patient status, additional functional criteria and insurance authorization.   Assistance Recommended at Discharge Intermittent Supervision/Assistance  Patient can return home with the following A little help with walking and/or transfers;A little help with bathing/dressing/bathroom;Direct supervision/assist for medications management;Direct supervision/assist for financial management;Assist for transportation;Help with stairs or ramp for entrance    Functional Status Assessment  Patient has had a recent decline in their functional status and demonstrates the ability to make significant improvements in function in a reasonable and predictable amount of time.  Equipment Recommendations  Other (comment) (pt declines need for DME)        Precautions / Restrictions Precautions Precautions: Fall Restrictions Weight Bearing Restrictions: Yes RUE Weight Bearing: Weight bearing as tolerated      Mobility Bed Mobility Overal bed mobility: Modified Independent                  Transfers Overall transfer level: Needs assistance Equipment used: None Transfers: Sit to/from Stand Sit to Stand: Min guard                  Balance Overall balance assessment: Mild deficits observed, not formally tested                                         ADL either performed or assessed with clinical judgement   ADL Overall ADL's : Needs assistance/impaired Eating/Feeding: Independent;Sitting   Grooming: Min guard;Standing Grooming Details (indicate cue type and reason): at the sink >5 minutes Upper Body Bathing: Set up;Sitting   Lower Body Bathing: Min guard;Sit to/from stand   Upper Body Dressing : Set up;Sitting   Lower Body Dressing: Min guard;Sit to/from stand   Toilet Transfer: Min guard;Ambulation;Regular Toilet   Toileting- Clothing Manipulation and Hygiene: Supervision/safety;Sitting/lateral lean       Functional mobility during ADLs: Min guard General ADL Comments: min G for safety only. After standing at the sink for >5 minutes he reported BLE fatigue. No overy LOB     Vision Baseline Vision/History: 0 No visual deficits Vision Assessment?: No apparent visual deficits Additional Comments: declines vision deficits     Perception Perception Perception Tested?: No   Praxis Praxis Praxis tested?: Not tested    Pertinent Vitals/Pain Pain  Assessment Pain Assessment: Faces Faces Pain Scale: Hurts a little bit Pain Location: generalized Pain Intervention(s): Monitored during session     Hand Dominance Right   Extremity/Trunk Assessment Upper Extremity Assessment Upper Extremity Assessment: Overall WFL for tasks assessed;Generalized weakness (reports RUE pain, using  functionally.)   Lower Extremity Assessment Lower Extremity Assessment: Defer to PT evaluation   Cervical / Trunk Assessment Cervical / Trunk Assessment: Normal   Communication Communication Communication: HOH   Cognition Arousal/Alertness: Awake/alert Behavior During Therapy: WFL for tasks assessed/performed Overall Cognitive Status: No family/caregiver present to determine baseline cognitive functioning                                 General Comments: very limited insight and awareness. Pt perseverating on going to a rehab facility after discharge. Fluctuating MMT and assist levels throughout     General Comments  VSS. BP initially elevated, RN re-checked and it was [facility]     Home Living Family/patient expects to be discharged to:: Private residence               Additional Comments: Pt declined giving home set up. He says he stays in his car, at his son's mothers house, or a hotel. Recently seperated from wife.      Prior Functioning/Environment Prior Level of Function : Independent/Modified Independent;Driving;Working/employed             Mobility Comments: states indep ADLs Comments: reports indep, working in [employer], drives        OT Problem List: Decreased strength;Decreased range of motion;Decreased activity tolerance;Impaired balance (sitting and/or standing);Decreased safety awareness;Decreased knowledge of use of DME or AE;Decreased knowledge of precautions;Decreased cognition      OT Treatment/Interventions: Self-care/ADL training;Therapeutic exercise;DME and/or AE instruction;Therapeutic activities;Patient/family education;Balance training    OT Goals(Current goals can be found in the care plan section) Acute Rehab OT Goals Patient Stated Goal: to go to rehab OT Goal Formulation: With patient Time For Goal Achievement: 11/26/22 Potential to Achieve Goals: Good ADL Goals Pt Will Perform Grooming: with modified independence;standing Pt  Will Perform Lower Body Dressing: Independently;sit to/from stand Pt Will Transfer to Toilet: Independently;ambulating Additional ADL Goal #1: Pt will independently complete IADL medicaiton management task  OT Frequency: Min 2X/week       AM-PAC OT "6 Clicks" Daily Activity     Outcome Measure Help from another person eating meals?: None Help from another person taking care of personal grooming?: A Little Help from another person toileting, which includes using toliet, bedpan, or urinal?: A Little Help from another person bathing (including washing, rinsing, drying)?: A Little Help from another person to put on and taking off regular upper body clothing?: A Little Help from another person to put on and taking off regular lower body clothing?: A Little 6 Click Score: 19   End of Session Equipment Utilized During Treatment: Gait belt Nurse Communication: Mobility status  Activity Tolerance: Patient tolerated treatment well Patient left: in chair;with call bell/phone within reach  OT Visit Diagnosis: Unsteadiness on feet (R26.81);Other abnormalities of gait and mobility (R26.89);Muscle weakness (generalized) (M62.81)                Time: 1308-6578 OT Time Calculation (min): 22 min Charges:  OT General Charges $OT Visit: 1 Visit OT Evaluation $OT Eval Moderate Complexity: 1 Mod  Derenda Mis, OTR/L Acute Rehabilitation Services Office [phone] Secure Chat Communication Preferred   Donia Pounds 11/12/2022,  2:41 PM

## 2022-11-12 NOTE — Transfer of Care (Signed)
Immediate Anesthesia Transfer of Care Note  Patient: [name]  Procedure(s) Performed: ESOPHAGOGASTRODUODENOSCOPY (EGD) BIOPSY SAVORY DILATION  Patient Location: PACU and Endoscopy Unit  Anesthesia Type:MAC  Level of Consciousness: drowsy  Airway & Oxygen Therapy: Patient Spontanous Breathing and Patient connected to face mask oxygen  Post-op Assessment: Report given to RN and Post -op Vital signs reviewed and stable  Post vital signs: Reviewed and stable  Last Vitals:  Vitals Value Taken Time  BP 176/88 11/12/22 0848  Temp    Pulse 76 11/12/22 0848  Resp 22 11/12/22 0848  SpO2 96 % 11/12/22 0848  Vitals shown include unvalidated device data.  Last Pain:  Vitals:   11/12/22 0809  TempSrc: Temporal  PainSc: 0-No pain      Patients Stated Pain Goal: 2 (11/08/22 1725)  Complications: No notable events documented.

## 2022-11-12 NOTE — TOC Progression Note (Signed)
Transition of Care (TOC) - Progression Note  Donn Pierini RN, BSN Transitions of Care Unit 4E- RN Case Manager See Treatment Team for direct phone #   Patient Details  Name: [name] MRN: [id number] Date of Birth: January 21, 1986  Transition of Care [facility]) CM/SW Contact  Zenda Alpers Lenn Sink, RN Phone Number: 11/12/2022, 3:46 PM  Clinical Narrative:    Noted pt's aggressive outburst with nursing staff again today, this is patients second episode since admit. Recommend Psych consult.   PT/OT evals also ordered as pt and family requesting SNF placement. Note recommendations have been made for [facility]. Msg received from OT that pt and his "oldest son's mother" are VERY adamant on him going to SNF at d/c. It is noted that pt has been living in his car, gets a hotel sometimes and sometimes stays with his son's mother (which he clarified is not his wife). It would be difficult to get [facility] services due to multiple barriers (pt behaviors during hospital stay/risk to staff, Medicare/Medicaid insurance, unstable housing situation). It would also be unlikely that pt would get a SNF bed offer due to the same barriers as well as pt's age being under 50.  SNF level of care is not appropriate at this time, as pt is independent with mobility and has a click score of 22 per PT note.  TOC will continue to follow, and will monitor patient advancement through interdisciplinary progression rounds. If new patient transition needs arise, please place a TOC consult.      Expected Discharge Plan: Home w Home Health Services Barriers to Discharge: Continued Medical Work up  Expected Discharge Plan and Services In-house Referral: Clinical Social Work Discharge Planning Services: CM Consult   Living arrangements for the past 2 months: Apartment Expected Discharge Date: 11/09/22                                     Social Determinants of Health (SDOH) Interventions SDOH Screenings   Food Insecurity: No  Food Insecurity (09/23/2022)  Housing: Low Risk  (09/23/2022)  Transportation Needs: No Transportation Needs (09/23/2022)  Utilities: Not At Risk (09/23/2022)  Depression (PHQ2-9): Low Risk  (09/22/2022)  Tobacco Use: High Risk (11/12/2022)    Readmission Risk Interventions     No data to display

## 2022-11-13 ENCOUNTER — Encounter (HOSPITAL_COMMUNITY): Payer: Self-pay | Admitting: Gastroenterology

## 2022-11-13 DIAGNOSIS — I770 Arteriovenous fistula, acquired: Secondary | ICD-10-CM

## 2022-11-13 DIAGNOSIS — F1220 Cannabis dependence, uncomplicated: Secondary | ICD-10-CM | POA: Diagnosis present

## 2022-11-13 DIAGNOSIS — F1223 Cannabis dependence with withdrawal: Secondary | ICD-10-CM | POA: Clinically undetermined

## 2022-11-13 LAB — GLUCOSE, CAPILLARY
Glucose-Capillary: 106 mg/dL — ABNORMAL HIGH (ref 70–99)
Glucose-Capillary: 113 mg/dL — ABNORMAL HIGH (ref 70–99)
Glucose-Capillary: 103 mg/dL — ABNORMAL HIGH (ref 70–99)
Glucose-Capillary: 131 mg/dL — ABNORMAL HIGH (ref 70–99)

## 2022-11-13 LAB — TYPE AND SCREEN: ABO/RH(D): O POS

## 2022-11-13 LAB — CBC
HCT: 20.8 % — ABNORMAL LOW (ref 39.0–52.0)
Hemoglobin: 7.1 g/dL — ABNORMAL LOW (ref 13.0–17.0)
MCH: 32 pg (ref 26.0–34.0)
MCHC: 34.1 g/dL (ref 30.0–36.0)
MCV: 93.7 fL (ref 80.0–100.0)
Platelets: 224 10*3/uL (ref 150–400)
RBC: 2.22 MIL/uL — ABNORMAL LOW (ref 4.22–5.81)
RDW: 16.5 % — ABNORMAL HIGH (ref 11.5–15.5)
WBC: 5.1 10*3/uL (ref 4.0–10.5)
nRBC: 0 % (ref 0.0–0.2)

## 2022-11-13 LAB — BPAM RBC
Blood Product Expiration Date: 202408042359
ISSUE DATE / TIME: 202407041125
Unit Type and Rh: 5100

## 2022-11-13 LAB — RENAL FUNCTION PANEL
Albumin: 2.7 g/dL — ABNORMAL LOW (ref 3.5–5.0)
Anion gap: 15 (ref 5–15)
BUN: 83 mg/dL — ABNORMAL HIGH (ref 6–20)
CO2: 24 mmol/L (ref 22–32)
Calcium: 8.6 mg/dL — ABNORMAL LOW (ref 8.9–10.3)
Chloride: 86 mmol/L — ABNORMAL LOW (ref 98–111)
Creatinine, Ser: 12.82 mg/dL — ABNORMAL HIGH (ref 0.61–1.24)
GFR, Estimated: 5 mL/min — ABNORMAL LOW (ref >60)
Glucose, Bld: 113 mg/dL — ABNORMAL HIGH (ref 70–99)
Phosphorus: 7.4 mg/dL — ABNORMAL HIGH (ref 2.5–4.6)
Potassium: 4.3 mmol/L (ref 3.5–5.1)
Sodium: 125 mmol/L — ABNORMAL LOW (ref 135–145)

## 2022-11-13 LAB — HEMOGLOBIN AND HEMATOCRIT, BLOOD
HCT: 25.3 % — ABNORMAL LOW (ref 39.0–52.0)
Hemoglobin: 8.7 g/dL — ABNORMAL LOW (ref 13.0–17.0)

## 2022-11-13 LAB — HEPATITIS B SURFACE ANTIBODY, QUANTITATIVE: Hep B S AB Quant (Post): 261 m[IU]/mL

## 2022-11-13 LAB — PREPARE RBC (CROSSMATCH)

## 2022-11-13 MED ORDER — HEPARIN SODIUM (PORCINE) 1000 UNIT/ML IJ SOLN
INTRAMUSCULAR | Status: AC
  Filled 2022-11-13: qty 1

## 2022-11-13 MED ORDER — SODIUM CHLORIDE 0.9% IV SOLUTION: Freq: Once | INTRAVENOUS | Status: DC

## 2022-11-13 MED ORDER — HEPARIN SODIUM (PORCINE) 1000 UNIT/ML IJ SOLN
INTRAMUSCULAR | Status: AC
  Filled 2022-11-13: qty 4

## 2022-11-13 NOTE — Progress Notes (Signed)
  Progress Note    11/13/2022 11:40 AM 1 Day Post-Op  Subjective: Currently on dialysis without complaints  Vitals:   11/13/22 1131 11/13/22 1133  BP:    Pulse:    Resp:    Temp: 97.9 F (36.6 C) 97.9 F (36.6 C)  SpO2:      Physical Exam: Somnolent but awakens and is alert Right arm with edema in the upper arm but very strong underlying thrill  CBC    Component Value Date/Time   WBC 5.1 11/13/2022 0649   RBC 2.22 (L) 11/13/2022 0649   HGB 7.1 (L) 11/13/2022 0649   HGB 10.4 (L) 03/17/2022 1721   HCT 20.8 (L) 11/13/2022 0649   HCT 30.2 (L) 03/17/2022 1721   PLT 224 11/13/2022 0649   PLT 207 03/17/2022 1721   MCV 93.7 11/13/2022 0649   MCV 87 03/17/2022 1721   MCH 32.0 11/13/2022 0649   MCHC 34.1 11/13/2022 0649   RDW 16.5 (H) 11/13/2022 0649   RDW 14.0 03/17/2022 1721   LYMPHSABS 1.7 10/27/2022 0001   MONOABS 0.6 10/27/2022 0001   EOSABS 0.2 10/27/2022 0001   BASOSABS 0.1 10/27/2022 0001    BMET    Component Value Date/Time   NA 125 (L) 11/13/2022 0649   NA 139 06/16/2022 1606   K 4.3 11/13/2022 0649   CL 86 (L) 11/13/2022 0649   CO2 24 11/13/2022 0649   GLUCOSE 113 (H) 11/13/2022 0649   BUN 83 (H) 11/13/2022 0649   BUN 72 (H) 06/16/2022 1606   CREATININE 12.82 (H) 11/13/2022 0649   CALCIUM 8.6 (L) 11/13/2022 0649   GFRNONAA 5 (L) 11/13/2022 0649   GFRAA 6 (L) 10/26/2019 1032    INR No results found for: "INR"  No intake or output data in the 24 hours ending 11/13/22 1140   Assessment/plan:  37 y.o. male is s/p revision right arm AV fistula currently being managed for esophageal stricture status post dilatation yesterday hypertension and glycemic issues followed by family medicine.  Psychiatry has been consulted.  Plan for discharge when medically stable.   Jaysa Kise C. Randie Heinz, MD [facility] Office: [phone] Pager: [phone]  11/13/2022 11:40 AM

## 2022-11-13 NOTE — Consult Note (Signed)
Daily Progress Note Intern Pager: [phone]  Patient name: [name] Medical record number: [id number] Date of birth: Jul 23, 1985 Age: 37 y.o. Gender: male  Primary Care Provider: [name], DO Admitting: VVS Code Status: FULL   Assessment and Plan: [name] is a 37 y.o. male admitted by VVS, for TCD placement and excision of aneurysmal fistula with revision which was completed on 6/28.  After HD on 6/29 BP became elevated in 200s/100s and FMTS consulted for blood pressure management. PMHx significant for HTN, HFmrEF, Alport syndrome, ESRD on HD TTS.  Blood pressure control improved with change from metoprolol to labetalol.  Family medicine service will sign off today.  Hospital Problem List      Hospital     * (Principal) [facility])     Severe uncontrolled hypertension     BP controlled. Will sign off today. Baseline BP 150-200/100-140.  BP improved with change from metoprolol to  labetalol. Would benefit from b-blocker for both HTN and HF.  -Continue home amlodipine 10 mg daily, hydralazine 100 mg TID -Continue clonidine to 0.3 mg TID ( prepped to send to pharmacy at d/c) -Continue losartan to 100mg  daily - continue labetalol 100mg  BID - goal BP systolic 160s-170 and diastolic 100s         ESRD (end stage renal disease) (HCC)     S/p TDC placement and excision of right arm aneurysmal fistula with  revision with VVS on 6/28.  Nephrology managing HD. Has HD TTS. -Continue plans per VVS and nephrology         Chest pain     Downtrending troponins. - arrange cardiology outpatient followup        Heart failure with mildly reduced ejection fraction (HFmrEF) (HCC)     Echo 06/03/22 w/ EF 55%. -Recommend f/u with cardiology OP        Hypertensive crisis     BP still elevated, continue dialysis today. Home meds include  amlodipine 10 mg daily, coreg 25 mg twice daily and hydralazine 50 mg 3  times daily, Imdur 30 mg daily. - Continue home  medications - prn labetalol until he receives HD - Monitor BP        Migraines     Migraine and vomiting both resolved. -Continue Tylenol -Continue sumatriptan prn -Continue home Depakote        ESRD on dialysis (HCC)     Aneurysm of arteriovenous dialysis fistula (HCC)     Macrocytic anemia     Hgb 9.9>7.9>7.7 s/p surgery. Transfusion threshold <7. Of note dose  have hx of lower GI bleed, previous colonoscopy showing diverticulosis. No  blood per rectum during hospitalization per pt.  -recheck CBC out pt at family medicine clinic  -Consider iron studies        Chronic diastolic CHF (congestive heart failure) (HCC)     Aneurysm of arteriovenous fistula (HCC)     Resistant hypertension     Acute cough     Esophageal dysphagia     EGD 7/3 with biopsies taken and dilation performed. Instruction per GI as follows: - Advance diet as tolerated.  - Cepacol or Halls Lozenges + / - Chloraseptic spray for next 72- 96 hours  to aid in sore throat.  - Continue twice daily PPI 40 mg as ordered.  - Carafate therapy twice daily for at least 2 if not 4 weeks ( with  patient' s renal dysfunction, we may not be able to use this  as long  however) .  - Await pathology results.  - If the patient has no significant improvement, then consider OP  esophageal manometry.        Abnormal barium swallow     Anemia of chronic disease     Hgb 7.1 on 7/4 down from 8.4 3 days earlier. Goal Hgb >7. -recheck CBC out pt at family medicine clinic  -Consider iron studies       FEN/GI: Renal diet PPx: SCDs Dispo:Home pending clinical improvement . Barriers include BP stability.   Subjective:  Patient seen this morning in dialysis.  He is very sleepy but pleasant and conversational.  He reports constipation but otherwise is doing well.  Objective: Temp:  [97.6 F (36.4 C)-98 F (36.7 C)] 97.6 F (36.4 C) (07/04 0830) Pulse Rate:  [76-80] 76 (07/04 0842) Resp:  [14-19] 14 (07/04 0842) BP:  (144-183)/(90-99) 149/92 (07/04 0842) SpO2:  [97 %-100 %] 99 % (07/04 0842) Weight:  [80.8 kg] 80.8 kg (07/04 0830) Physical Exam: General: well-appearing, no acute distress Cardio: RRR, no murmurs on exam. Pulm: CTA bilaterally.  No increased work of breathing Abdominal: soft, non-tender, non-distended Neuro: speech normal in content Psych:  Cognition and judgment appear intact. Alert, communicative  and cooperative.  Laboratory: Most recent CBC Lab Results  Component Value Date   WBC 5.1 11/13/2022   HGB 7.1 (L) 11/13/2022   HCT 20.8 (L) 11/13/2022   MCV 93.7 11/13/2022   PLT 224 11/13/2022   Most recent BMP    Latest Ref Rng & Units 11/13/2022    6:49 AM  BMP  Glucose 70 - 99 mg/dL 161   BUN 6 - 20 mg/dL 83   Creatinine 0.96 - 1.24 mg/dL 04.54   Sodium 098 - 119 mmol/L 125   Potassium 3.5 - 5.1 mmol/L 4.3   Chloride 98 - 111 mmol/L 86   CO2 22 - 32 mmol/L 24   Calcium 8.9 - 10.3 mg/dL 8.6      [name], DO 11/13/2022, 9:04 AM  PGY-1, [facility] FPTS Intern pager: [phone], text pages welcome Secure chat group [facility] Teaching Service

## 2022-11-13 NOTE — Procedures (Signed)
Patient was seen on dialysis and the procedure was supervised.  BFR 400  Via TDC BP is  152/88.   Patient appears to be tolerating treatment well. Feels weak, hb low: transfuse 1 unit of PRBC.  Nyeli Holtmeyer Jaynie Collins 11/13/2022

## 2022-11-13 NOTE — Assessment & Plan Note (Signed)
Echo 06/03/22 w/ EF 55%. -Recommend f/u with cardiology OP

## 2022-11-13 NOTE — Assessment & Plan Note (Signed)
Migraine and vomiting both resolved. -Continue Tylenol -Continue sumatriptan prn -Continue home Depakote 

## 2022-11-13 NOTE — Consult Note (Addendum)
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 male admitted medically on 11/07/2022  5:04 AM for a scheduled surgical procedure involving plication of a large aneurysm of his right AV fistula and insertion of tunneled dialysis catheter. His PMHx is significant for ESRD on HD TTS, HTN, Alport syndrome, and HFmrEF. He reports no significant past psychiatric history, denies any past psychiatric diagnoses. The psychiatric consult team was consulted for assessment of agitation, requested by Lars Mage, PA-C.  [name] was evaluated while in his HD session. His presentation and reported symptoms are most consistent with the diagnosis of adjustment disorder with mixed disturbance of emotion and conduct, which are chronic and evidenced by a 1-year history of marked distress and significant impairments in social functioning. His marked disturbances in conduct would account for the irritability and agitation witnessed during his hospitalization. He identified multiple psychosocial stressors that have contributed to agitation during his hospitalization. Stressors include his chronic medical illnesses that prevent him from working, homelessness, recent separation from wife of 10 years, and supportive relatives that have moved out of [place].  He also meets criteria for prolonged grief disorder, identifying the death of his mother in 12-Dec-2006 as a continued stressor, reporting identity disruption, intense emotional pain related to her passing, and intense loneliness.   Patient also meets criteria for cannabis use disorder, reporting daily cannabis use primarily used for sleep and describes dependence. Certainly, cannabis withdrawal induced  agitation may be playing a role, exacerbating his behavioral disturbances.  There is no evidence of suicidal ideation, homicidal ideation, or psychosis. There is no evidence of acute psychiatric disturbance requiring ongoing psychiatric consultation.  His treatment plan is outlined below.     Diagnoses:  Active Hospital problems: Principal Problem:   A-V fistula (HCC) Active Problems:   Severe uncontrolled hypertension   ESRD (end stage renal disease) (HCC)   Chest pain   Heart failure with mildly reduced ejection fraction (HFmrEF) (HCC)   Hypertensive crisis   Migraines   ESRD on dialysis (HCC)   Aneurysm of arteriovenous dialysis fistula (HCC)   Macrocytic anemia   Chronic diastolic CHF (congestive heart failure) (HCC)   Aneurysm of arteriovenous fistula (HCC)   Resistant hypertension   Acute cough   Esophageal dysphagia   Abnormal barium swallow   Anemia of chronic disease   Cannabis use disorder, moderate, in controlled environment, dependence (HCC)   Cannabis dependence with withdrawal (HCC)     Plan   ## Psychiatric Medication Recommendations:  -- Continue Depakote 500 mg ER at bedtime for agitation and sleep.   ## Medical Decision Making Capacity:  Capacity was not formally addressed during this encounter; however, the patient appeared to understand and participate in the discussion about their treatment plan.   ## Further Work-up:  -- Per primary -- most recent EKG on 11/10/2022 had QtC of 459 -- Pertinent labwork reviewed earlier this admission includes:    ## Disposition:  -- There are no current psychiatric contraindications to discharge at this time  ## Behavioral / Environmental:  --   No specific recommendations at this time.    ## Safety and Observation Level:  - Based on my clinical evaluation, I estimate the patient to be at  low risk of self harm in the current setting - At this time, we recommend a routine level of observation. This decision is  based on my review of the chart including patient's history and current presentation, interview of the patient, mental status examination, and consideration of suicide risk including evaluating suicidal ideation, plan, intent, suicidal or self-harm behaviors, risk factors, and protective factors. This judgment is based on our ability to directly address suicide risk, implement suicide prevention strategies and develop a safety plan while the patient is in the clinical setting. Please contact our team if there is a concern that risk level has changed.  Suicide risk assessment  Patient has following modifiable risk factors for suicide: social isolation  Patient has following non-modifiable or demographic risk factors for suicide: male gender and separation or divorce  Patient has the following protective factors against suicide: Supportive family, Supportive friends, and Cultural, spiritual, or religious beliefs that discourage suicide   Thank you for this consult request. Recommendations have been communicated to the primary team.  We will sign off at this time.   Lorri Frederick, MD  Psychiatric and Social History   Relevant Aspects of Hospital Course:  Admitted on 11/07/2022 for scheduled surgical procedure involving plication of a large aneurysm of his right AV fistula and insertion of tunneled dialysis catheter.   Patient Report:  Patient at bedside in HD session. He reports he has had worsening agitation and "frustration" during this hospitalization. He shares he is frustrated by the amount of pills he has to take daily, also feels nursing staff have disrespected him at times. He shares he will often retaliate when he feels disrespected describing it as "hitting below the belt".  Admits to feeling angered by his current situation, has felt rejected by his wife since falling ill. Patient and wife have been separated for a year, and have been married for 10 years. He feels betrayed by  her, believes she left him when he fell ill, and has now moved on with another boyfriend. Patient has 4 children, 3 of whom he shares with his current wife. He notes that his ex-partner bring their child to visit him daily.   He denies any symptoms of depression, denies any history of suicidal thoughts. Proudly reports he has been baptized recently, and has a newfound faith in God. He denies any homicidal ideation. Denies hallucinations, paranoia, or delusional thought processes.   Psychiatric ROS Mood Symptoms Denies any symptoms of depressed mood or anhedonia.  Denies any disturbances and appetite or sleep. Denies suicidal ideation. Reports loving relationship with children and strong faith in God that are protective factors.   Manic Symptoms Denies any present or lifetime history of expansive energy or mood.  Anxiety Symptoms Denies symptoms of anxiety presently.  Trauma and stressor related symptoms Patient denies any specific exposure to traumatic event and that could explain a diagnosis of PTSD.  However he identifies the death of his mother as devastating loss that is accompanied by symptoms of identity destruction, feelings of himself has died, intense emotional pain that that includes anxiety, bitterness, and sorrow related to her death.  He also reports intense loneliness, identifies as velamentously only person that really understood him. Patient reports marked distress secondary to the psychosocial stressors described above.  Reports that his behavioral distress is significantly impairing social function.  Reactions are described as out of proportion to the severity of the stressor, will often feel agitated and consulted.  Psychosis Symptoms Denies lifetime or present symptoms  of hallucinations, paranoia, and delusions.    Collateral information:  None  Psychiatric History:  Information collected from patient  Prev Dx/Sx: Denies Current Psych Provider: Denies Current Meds:  Denies Previous Med Trials: Denies Therapy: Denies  Prior ECT: None identified on chart review Prior Psych Hospitalization: Denies  Prior Self Harm: Denies Prior Violence: Denies  Family Psych History: Reports he has a nephew with autism. Unable to recall any significant family history. Family Hx suicide: Denies  Social History:  Developmental Hx: Patient is a [place] native. Occupational Hx: Unemployed due to his chronic medical conditions. Believes he was wrongfully terminated from his last job. Legal Hx: None identified on chart review Living Situation: Patient is unhoused, reports relatives are financially supporting him and will cover the cost of a hotel at discharge. Spiritual Hx: Ephriam Knuckles, recently baptized Access to weapons: Not assessed   Tobacco use: Denies Alcohol use: Denies Drug use: Reports daily cannabis use (smoke 1-2 blunts), he uses it for sleep and to relax him.   Exam Findings   Psychiatric Specialty Exam:  Presentation  General Appearance: Appropriate for Environment; Casual; Fairly Groomed  Eye Contact:Fair  Speech:Clear and Coherent; Normal Rate  Speech Volume:Normal  Handedness:Right   Mood and Affect  Mood:Euthymic ("I'm ok now")  Affect:Appropriate; Full Range; Congruent   Thought Process  Thought Processes:Coherent; Goal Directed; Linear  Descriptions of Associations:Intact  Orientation:Full (Time, Place and Person)  Thought Content:Logical; WDL  History of Schizophrenia/Schizoaffective disorder:None Duration of Psychotic Symptoms: None Hallucinations:Hallucinations: None  Ideas of Reference:None  Suicidal Thoughts:Suicidal Thoughts: No  Homicidal Thoughts:Homicidal Thoughts: No   Sensorium  Memory:Immediate Fair  Judgment:Fair  Insight:Fair   Executive Functions  Concentration:Good  Attention Span:Good  Recall:Good  Fund of Knowledge:Good  Language:Good   Psychomotor Activity  Psychomotor  Activity:Psychomotor Activity: Normal   Assets  Assets:Communication Skills; Desire for Improvement; Resilience   Sleep  Sleep:Sleep: Good    Physical Exam: Vital signs:  Temp:  [97.6 F (36.4 C)-98 F (36.7 C)] 98 F (36.7 C) (07/04 1215) Pulse Rate:  [75-87] 87 (07/04 1334) Resp:  [10-26] 18 (07/04 1334) BP: (145-196)/(81-100) 147/92 (07/04 1334) SpO2:  [97 %-100 %] 100 % (07/04 1334) Weight:  [80.8 kg] 80.8 kg (07/04 0830) Physical Exam Constitutional:      General: He is not in acute distress. HENT:     Head: Normocephalic and atraumatic.  Pulmonary:     Effort: No respiratory distress.     Comments: [place] in place Neurological:     Mental Status: [name].     Blood pressure (!) 147/92, pulse 87, temperature 98 F (36.7 C), temperature source Oral, resp. rate 18, height 5\' 8"  (1.727 m), weight 80.8 kg, SpO2 100 %. Body mass index is 27.08 kg/m.   Other History   These have been pulled in through the EMR, reviewed, and updated if appropriate.   Family History:  The patient's family history includes Asthma in his son; Diabetes in his mother; Hearing loss in his father; Heart disease in his maternal grandmother; Hypertension in his mother and sister; Kidney failure in his mother; Stroke in his maternal grandfather.  Medical History: Past Medical History:  Diagnosis Date   Bipolar 1 disorder (HCC)    Depression    ESRD on hemodialysis (HCC)    HD at [facility] on TTS schedule   GERD (gastroesophageal reflux disease)    Hearing difficulty of left ear    75% hearing   Hearing disorder of right ear  50% hearing   Hypertension    Low blood sugar     Surgical History: Past Surgical History:  Procedure Laterality Date   A/V FISTULAGRAM Right 10/27/2022   Procedure: A/V Fistulagram;  Surgeon: Chuck Hint, MD;  Location: [facility] INVASIVE CV LAB;  Service: Cardiovascular;  Laterality: Right;   APPENDECTOMY     AV FISTULA PLACEMENT Left 03/03/2019    Procedure: ARTERIOVENOUS (AV) FISTULA CREATION LEFT ARM;  Surgeon: Maeola Harman, MD;  Location: [facility] OR;  Service: Vascular;  Laterality: Left;   BASCILIC VEIN TRANSPOSITION Right 04/12/2019   Procedure: RIGHT UPPER EXTREMITY BASCILIC VEIN TRANSPOSITION FIRST STAGE FISTULA;  Surgeon: Chuck Hint, MD;  Location: [facility] OR;  Service: Vascular;  Laterality: Right;   BIOPSY  10/14/2021   Procedure: BIOPSY;  Surgeon: Sherrilyn Rist, MD;  Location: [facility] ENDOSCOPY;  Service: Gastroenterology;;   BIOPSY  06/06/2022   Procedure: BIOPSY;  Surgeon: Napoleon Form, MD;  Location: [facility] ENDOSCOPY;  Service: Gastroenterology;;   COLONOSCOPY WITH PROPOFOL N/A 10/14/2021   Procedure: COLONOSCOPY WITH PROPOFOL;  Surgeon: Sherrilyn Rist, MD;  Location: [facility] ENDOSCOPY;  Service: Gastroenterology;  Laterality: N/A;   ESOPHAGOGASTRODUODENOSCOPY (EGD) WITH PROPOFOL N/A 06/06/2022   Procedure: ESOPHAGOGASTRODUODENOSCOPY (EGD) WITH PROPOFOL;  Surgeon: Napoleon Form, MD;  Location: [facility] ENDOSCOPY;  Service: Gastroenterology;  Laterality: N/A;   FISTULA SUPERFICIALIZATION Right 11/07/2022   Procedure: PLICATION OF LARGE ANEURYSMS OF RIGHT ARTERIOVENOUS FISTULA WITH INSERTION OF 7mm INTERPOSITIONAL GORTEX GRAFT;  Surgeon: Chuck Hint, MD;  [facility] OR;  Service: Vascular;  Laterality: Right;   FLEXIBLE SIGMOIDOSCOPY N/A 04/14/2022   Procedure: FLEXIBLE SIGMOIDOSCOPY;  Surgeon: Napoleon Form, MD;  Location: [facility] ENDOSCOPY;  Service: Gastroenterology;  Laterality: N/A;   INSERTION OF DIALYSIS CATHETER N/A 11/07/2022   Procedure: INSERTION OF RIGHT INTERNAL JUGULAR TUNNELED DIALYSIS CATHETER;  Surgeon: Chuck Hint, MD;  Location: [facility] OR;  Service: Vascular;  Laterality: N/A;   LIGATION OF ARTERIOVENOUS  FISTULA Left 03/06/2019   Procedure: LIGATION OF ARTERIOVENOUS  FISTULA;  Surgeon: Cephus Shelling, MD;  Location: [facility] OR;  Service: Vascular;  Laterality: Left;   PERIPHERAL  VASCULAR BALLOON ANGIOPLASTY  10/27/2022   Procedure: PERIPHERAL VASCULAR BALLOON ANGIOPLASTY;  Surgeon: Chuck Hint, MD;  Location[facility] INVASIVE CV LAB;  Service: Cardiovascular;;  rt upper arm fistula   spinal tap     SPINE SURGERY     related to a spinal infection, unsure of surgery or infection source   WISDOM TOOTH EXTRACTION      Medications:   Current Facility-Administered Medications:    0.9 %  sodium chloride infusion (Manually program via Guardrails IV Fluids), , Intravenous, Once, Stovall, Wille Celeste, PA-C   acetaminophen (TYLENOL) tablet 650 mg, 650 mg, Oral, Q6H PRN, Nada Libman, MD, 650 mg at 11/12/22 1710   amLODipine (NORVASC) tablet 10 mg, 10 mg, Oral, Daily, Nada Libman, MD, 10 mg at 11/13/22 1341   calcium acetate (PHOSLO) capsule 667 mg, 667 mg, Oral, TID WC, Nada Libman, MD, 667 mg at 11/12/22 1700   calcium carbonate (TUMS - dosed in mg elemental calcium) chewable tablet 200 mg of elemental calcium, 1 tablet, Oral, TID WC, Glendale Chard, DO, 200 mg of elemental calcium at 11/12/22 1700   Chlorhexidine Gluconate Cloth 2 % PADS 6 each, 6 each, Topical, Q0600, Delano Metz, MD, 6 each at 11/11/22 0547   Chlorhexidine Gluconate Cloth 2 % PADS 6 each, 6 each, Topical, Q0600, [place],  Molly Maduro, MD, 6 each at 11/08/22 1123   cloNIDine (CATAPRES) tablet 0.3 mg, 0.3 mg, Oral, TID, [place]endale Chard, DO, 0.3 mg at 11/12/22 2106   divalproex (DEPAKOTE ER) 24 hr tablet 500 mg, 500 mg, Oral, QHS, Nada Libman, MD, 500 mg at 11/12/22 2106   feeding supplement (ENSURE ENLIVE / ENSURE PLUS) liquid 237 mL, 237 mL, Oral, BID BM, Glendale Chard, DO, 237 mL at 11/12/22 1015   ferric citrate (AURYXIA) tablet 210 mg, 210 mg, Oral, TID WC, Nada Libman, MD, 210 mg at 11/12/22 1701   guaiFENesin-dextromethorphan (ROBITUSSIN DM) 100-10 MG/5ML syrup 15 mL, 15 mL, Oral, Q4H PRN, Nada Libman, MD   haloperidol (HALDOL) tablet 5 mg, 5 mg, Oral, Q8H PRN **OR**  haloperidol lactate (HALDOL) injection 5 mg, 5 mg, Intramuscular, Q8H PRN, Cinderella, Margaret A   heparin sodium (porcine) 1000 UNIT/ML injection, , , ,    heparin sodium (porcine) 1000 UNIT/ML injection, , , ,    hydrALAZINE (APRESOLINE) tablet 100 mg, 100 mg, Oral, TID, Nada Libman, MD, 100 mg at 11/12/22 2104   labetalol (NORMODYNE) tablet 100 mg, 100 mg, Oral, BID, Darnelle Spangle B, MD, 100 mg at 11/13/22 1341   losartan (COZAAR) tablet 100 mg, 100 mg, Oral, Daily, Glendale Chard, DO, 100 mg at 11/13/22 1341   ondansetron (ZOFRAN) injection 4 mg, 4 mg, Intravenous, Q6H PRN, Nada Libman, MD, 4 mg at 11/08/22 2032   oxyCODONE-acetaminophen (PERCOCET/ROXICET) 5-325 MG per tablet 1-2 tablet, 1-2 tablet, Oral, Q4H PRN, Nada Libman, MD, 2 tablet at 11/11/22 2122   oxyCODONE-acetaminophen (PERCOCET/ROXICET) 5-325 MG per tablet 2 tablet, 2 tablet, Oral, Q6H PRN, Nada Libman, MD, 2 tablet at 11/12/22 1004   pantoprazole (PROTONIX) EC tablet 40 mg, 40 mg, Oral, BID, Nada Libman, MD, 40 mg at 11/13/22 1341   phenol (CHLORASEPTIC) mouth spray 1 spray, 1 spray, Mouth/Throat, PRN, Nada Libman, MD   sodium chloride 0.9 % bolus 250 mL, 250 mL, Intravenous, Once, Delano Metz, MD   sucralfate (CARAFATE) tablet 1 g, 1 g, Oral, BID, Mansouraty, Netty Starring., MD, 1 g at 11/13/22 1341   SUMAtriptan (IMITREX) tablet 50 mg, 50 mg, Oral, Q2H PRN, Nada Libman, MD, 50 mg at 11/11/22 2042   zolpidem (AMBIEN) tablet 5 mg, 5 mg, Oral, QHS PRN,MR X 1, Nada Libman, MD, 5 mg at 11/12/22 2117  Allergies: Allergies  Allergen Reactions   Zestril [Lisinopril] Swelling   Nsaids Other (See Comments)    "Kidney problems "   Carvedilol Other (See Comments)    Makes mouth numb   Risperdal [Risperidone] Other (See Comments)    Unknown reaction

## 2022-11-13 NOTE — Procedures (Signed)
HD Note:  Some information was entered later than the data was gathered due to patient care needs. The stated time with the data is accurate.  Received patient in bed to unit.  Alert and oriented.  Informed consent signed and in chart.   TX duration: 4 Hours  Patient complained of right arm cramping and right had going cold.  He had the BP cuff on that arm and took it off, saying that it and the O2 sensor were causing the discomfort.  Patient ordered and received 1 unit of RBC during treatment.  Approximately 375 ml of volume  Transported back to the room  Alert, without acute distress.  Hand-off given to patient's nurse.   Access used: Upper right chest HD catheter Access issues: Only when patient moved a great deal.  Total UF removed: 3625 ml (375 ml for blood administration, 4000 ml UF pulled)   [facility]

## 2022-11-13 NOTE — Assessment & Plan Note (Signed)
S/p TDC placement and excision of right arm aneurysmal fistula with revision with [facility] on 6/28.  Nephrology managing HD. Has HD TTS. -Continue plans per VVS and nephrology  

## 2022-11-13 NOTE — Assessment & Plan Note (Signed)
BP controlled. Will sign off today. Baseline BP 150-200/100-140.  BP improved with change from metoprolol to labetalol. Would benefit from b-blocker for both HTN and HF.  -Continue home amlodipine 10 mg daily, hydralazine 100 mg TID -Continue clonidine to 0.3 mg TID ( prepped to send to pharmacy at d/c) -Continue losartan to 100mg  daily - continue labetalol 100mg  BID - goal BP systolic 160s-170 and diastolic 100s

## 2022-11-13 NOTE — Progress Notes (Deleted)
Daily Progress Note Intern Pager: [phone]  Patient name: [name] Medical record number: [id number] Date of birth: 01-02-1986 Age: 37 y.o. Gender: male  Primary Care Provider: [name], DO Admitting: VVS Code Status: FULL   Assessment and Plan: [name] is a 37 y.o. male admitted by [facility], for TCD placement and excision of aneurysmal fistula with revision which was completed on 6/28.  After HD on 6/29 BP became elevated in 200s/100s and FMTS consulted for blood pressure management. PMHx significant for HTN, HFmrEF, Alport syndrome, ESRD on HD TTS.  Blood pressure control improved with change from metoprolol to labetalol.  Family medicine service will sign off today.  Hospital Problem List      Hospital     * (Principal) A-V fistula [facility])     Severe uncontrolled hypertension     BP controlled. Will sign off today. Baseline BP 150-200/100-140.  BP improved with change from metoprolol to  labetalol. Would benefit from b-blocker for both HTN and HF.  -Continue home amlodipine 10 mg daily, hydralazine 100 mg TID -Continue clonidine to 0.3 mg TID ( prepped to send to pharmacy at d/c) -Continue losartan to 100mg  daily - continue labetalol 100mg  BID - goal BP systolic 160s-170 and diastolic 100s         ESRD (end stage renal disease) (HCC)     S/p TDC placement and excision of right arm aneurysmal fistula with  revision with [facility] on 6/28.  Nephrology managing HD. Has HD TTS. -Continue plans per [facility]VS and nephrology         Chest pain     Downtrending troponins. - arrange cardiology outpatient followup        Heart failure with mildly reduced ejection fraction (HFmrEF) (HCC)     Echo 06/03/22 w/ EF 55%. -Recommend f/u with cardiology OP        Hypertensive crisis     BP still elevated, continue dialysis today. Home meds include  amlodipine 10 mg daily, coreg 25 mg twice daily and hydralazine 50 mg 3  times daily, Imdur 30 mg daily. - Continue home  medications - prn labetalol until he receives HD - Monitor BP        Migraines     Migraine and vomiting both resolved. -Continue Tylenol -Continue sumatriptan prn -Continue home Depakote        ESRD on dialysis (HCC)     Aneurysm of arteriovenous dialysis fistula (HCC)     Macrocytic anemia     Hgb 9.9>7.9>7.7 s/p surgery. Transfusion threshold <7. Of note dose  have hx of lower GI bleed, previous colonoscopy showing diverticulosis. No  blood per rectum during hospitalization per pt.  -recheck CBC out pt at family medicine clinic  -Consider iron studies        Chronic diastolic CHF (congestive heart failure) (HCC)     Aneurysm of arteriovenous fistula (HCC)     Resistant hypertension     Acute cough     Esophageal dysphagia     EGD 7/3 with biopsies taken and dilation performed. Instruction per GI as follows: - Advance diet as tolerated.  - Cepacol or Halls Lozenges + / - Chloraseptic spray for next 72- 96 hours  to aid in sore throat.  - Continue twice daily PPI 40 mg as ordered.  - Carafate therapy twice daily for at least 2 if not 4 weeks ( with  patient' s renal dysfunction, we may not be able to use this  as long  however) .  - Await pathology results.  - If the patient has no significant improvement, then consider OP  esophageal manometry.        Abnormal barium swallow     Anemia of chronic disease     Hgb 7.1 on 7/4 down from 8.4 3 days earlier. Goal Hgb >7. -recheck CBC out pt at family medicine clinic  -Consider iron studies       FEN/GI: Renal diet PPx: SCDs Dispo:Home pending clinical improvement . Barriers include BP stability.   Subjective:  Patient seen this morning in dialysis.  He is very sleepy but pleasant and conversational.  He reports constipation but otherwise is doing well.  Objective: Temp:  [97.6 F (36.4 C)-98 F (36.7 C)] 97.6 F (36.4 C) (07/04 0830) Pulse Rate:  [74-80] 76 (07/04 0842) Resp:  [14-19] 14 (07/04 0842) BP:  (144-183)/(90-99) 149/92 (07/04 0842) SpO2:  [97 %-100 %] 99 % (07/04 0842) Weight:  [80.8 kg] 80.8 kg (07/04 0830) Physical Exam: General: well-appearing, no acute distress Cardio: RRR, no murmurs on exam. Pulm: CTA bilaterally.  No increased work of breathing Abdominal: soft, non-tender, non-distended Neuro: speech normal in content Psych:  Cognition and judgment appear intact. Alert, communicative  and cooperative.  Laboratory: Most recent CBC Lab Results  Component Value Date   WBC 5.1 11/13/2022   HGB 7.1 (L) 11/13/2022   HCT 20.8 (L) 11/13/2022   MCV 93.7 11/13/2022   PLT 224 11/13/2022   Most recent BMP    Latest Ref Rng & Units 11/13/2022    6:49 AM  BMP  Glucose 70 - 99 mg/dL 161   BUN 6 - 20 mg/dL 83   Creatinine 0.96 - 1.24 mg/dL 04.54   Sodium 098 - 119 mmol/L 125   Potassium 3.5 - 5.1 mmol/L 4.3   Chloride 98 - 111 mmol/L 86   CO2 22 - 32 mmol/L 24   Calcium 8.9 - 10.3 mg/dL 8.6      [name], DO 11/13/2022, 8:57 AM  PGY-1, [place] [facility] FPTS Intern pager: [phone], text pages welcome Secure chat group [facility] Teaching Service

## 2022-11-13 NOTE — Assessment & Plan Note (Signed)
Hgb 7.1 on 7/4 down from 8.4 3 days earlier. Goal Hgb >7. -recheck CBC out pt at family medicine clinic  -Consider iron studies

## 2022-11-13 NOTE — Assessment & Plan Note (Signed)
Downtrending troponins. - arrange cardiology outpatient followup

## 2022-11-13 NOTE — Progress Notes (Signed)
[facility] Progress Note   Subjective:  Seen during HD - calm and talkative today - 4L UFG. Aware of yesterday's events. Hgb 7.1 - he is asking about getting a unit of blood, feels weak - will order.  Objective Vitals:   11/13/22 0830 11/13/22 0842 11/13/22 0900 11/13/22 0930  BP: (!) 147/95 (!) 149/92 (!) 192/95 (!) 196/92  Pulse: 80 76 75 82  Resp: 19 14 (!) 23 (!) 21  Temp: 97.6 F (36.4 C)     TempSrc:      SpO2: 99% 99% 99% 100%  Weight: 80.8 kg     Height:       Physical Exam General: NAD, [place] O2 in place Heart: RRR; no murmur Lungs: CTAB Abdomen: soft Extremities: no LE edema, RUE with edema and + bruit heard -> improving Dialysis Access: [facility] in R chest  Additional Objective Labs: Basic Metabolic Panel: Recent Labs  Lab 11/08/22 1134 11/10/22 0904 11/12/22 0037 11/13/22 0649  NA 137 131* 128* 125*  K 3.9 4.3 4.5 4.3  CL 93* 90* 90* 86*  CO2 25 25 26 24   GLUCOSE 83 98 100* 113*  BUN 46* 65* 55* 83*  CREATININE 8.82* 11.70* 9.73* 12.82*  CALCIUM 9.7 8.6* 8.4* 8.6*  PHOS 7.1*  --  7.7* 7.4*   Liver Function Tests: Recent Labs  Lab 11/08/22 1134 11/12/22 0037 11/13/22 0649  ALBUMIN 3.2* 2.6* 2.7*   CBC: Recent Labs  Lab 11/08/22 1134 11/09/22 0126 11/10/22 0904 11/12/22 0037 11/13/22 0649  WBC 7.9 7.3 6.9 6.0 5.1  HGB 7.9* 7.7* 8.4* 7.5* 7.1*  HCT 24.7* 24.4* 25.7* 22.5* 20.8*  MCV 98.0 100.4* 98.1 96.2 93.7  PLT 209 213 251 225 224   Cardiac Enzymes: Recent Labs  Lab 11/08/22 0109  CKTOTAL 422*  CKMB 2.2   CBG: Recent Labs  Lab 11/12/22 1102 11/12/22 1605 11/12/22 2120 11/13/22 0524 11/13/22 0748  GLUCAP 123* 93 95 106* 113*   Medications:  sodium chloride      amLODipine  10 mg Oral Daily   calcium acetate  667 mg Oral TID WC   calcium carbonate  1 tablet Oral TID WC   Chlorhexidine Gluconate Cloth  6 each Topical Q0600   Chlorhexidine Gluconate Cloth  6 each Topical Q0600   cloNIDine  0.3 mg Oral TID    divalproex  500 mg Oral QHS   feeding supplement  237 mL Oral BID BM   ferric citrate  210 mg Oral TID WC   hydrALAZINE  100 mg Oral TID   labetalol  100 mg Oral BID   losartan  100 mg Oral Daily   pantoprazole  40 mg Oral BID   sucralfate  1 g Oral BID    Dialysis Orders: [facility] at [facility], 400/A1.5, EDW 73.5kg, 2K/2Ca, AVF -> now TDC, heparin 1200U bolus - Mircera IV q 2 weeks - last 6/25 - due 7/9. 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 ESRD: S/p extra HD 6/28, then back to usual. HD today - 4L UFG. HTN/volume: BP high, prior pulm edema on CXR resolved although now appears to be way up again - 4L UFG today. Continue home meds, lower EDW as tolerated. Anemia of ESRD: In addition to post-op drop, not due for ESA yet. He is symptomatic - 1U PRBCs ordered today. Secondary hyperparathyroidism: Ca ok, Phos high - continue  home binders. Nutrition:  Alb low, continue renal diet. Hearing loss  Ozzie Hoyle, PA-C 11/13/2022, 10:02 AM  [employer]

## 2022-11-13 NOTE — Assessment & Plan Note (Signed)
EGD 7/3 with biopsies taken and dilation performed. Instruction per GI as follows: - Advance diet as tolerated.  - Cepacol or Halls Lozenges + / - Chloraseptic spray for next 72- 96 hours to aid in sore throat.  - Continue twice daily PPI 40 mg as ordered.  - Carafate therapy twice daily for at least 2 if not 4 weeks ( with patient' s renal dysfunction, we may not be able to use this as long however) .  - Await pathology results.  - If the patient has no significant improvement, then consider OP esophageal manometry. 

## 2022-11-13 NOTE — Progress Notes (Signed)
PT Cancellation Note  Patient Details Name: TAWFIQ BOQUIST MRN: [id number] DOB: 1986/01/11   Cancelled Treatment:    Reason Eval/Treat Not Completed: Patient at procedure or test/unavailable   Angelina Ok [facility] 11/13/2022, 9:32 AM Skip Mayer PT Acute Rehabilitation Services Office [phone]

## 2022-11-14 LAB — GLUCOSE, CAPILLARY
Glucose-Capillary: 71 mg/dL (ref 70–99)
Glucose-Capillary: 86 mg/dL (ref 70–99)
Glucose-Capillary: 91 mg/dL (ref 70–99)
Glucose-Capillary: 103 mg/dL — ABNORMAL HIGH (ref 70–99)
Glucose-Capillary: 119 mg/dL — ABNORMAL HIGH (ref 70–99)

## 2022-11-14 LAB — TYPE AND SCREEN
Antibody Screen: NEGATIVE
Unit division: 0

## 2022-11-14 LAB — BPAM RBC

## 2022-11-14 LAB — SURGICAL PATHOLOGY

## 2022-11-14 MED ORDER — LOSARTAN POTASSIUM 100 MG PO TABS: 100.0000 mg | ORAL_TABLET | Freq: Every day | ORAL | 1 refills | Status: DC

## 2022-11-14 MED ORDER — SUCRALFATE 1 G PO TABS: 1.0000 g | ORAL_TABLET | Freq: Two times a day (BID) | ORAL | 1 refills | Status: DC

## 2022-11-14 MED ORDER — LABETALOL HCL 100 MG PO TABS: 100.0000 mg | ORAL_TABLET | Freq: Two times a day (BID) | ORAL | 1 refills | Status: DC

## 2022-11-14 MED ORDER — CLONIDINE HCL 0.3 MG PO TABS: 0.3000 mg | ORAL_TABLET | Freq: Three times a day (TID) | ORAL | 11 refills | Status: DC

## 2022-11-14 NOTE — Progress Notes (Signed)
D/C order noted. Contacted [facility] to advise clinic of pt's d/c today and that pt should resume care tomorrow.  Corinthian Kemler Renal Navigator [phone]   

## 2022-11-14 NOTE — Progress Notes (Signed)
Occupational Therapy Treatment Patient Details Name: [name] MRN: [id number] DOB: Jul 31, 1985 Today's Date: 11/14/2022   History of present illness 37 y.o. male presents to [facility] hospital with AV fistula on 6/30. PMH includes ESRD, Alport syndrome with hearing loss, HTN, bipolar disorder.   OT comments  Patient in bathroom upon entry and patient stating he took himself to bathroom and was Mod I for toilet hygiene. Patient stating he felt fatigued and short of breath and was not uncommon to feel this way the day after having HD. Patient's SpO2 98% on RA. Patient performed self care seated and standing at sink with increased time due to frequent rest breaks. Patient stating that his blood sugar is dropping and making him shake. Patient assisted back to med with no assistance for bed mobility. Discharge recommendations continue to be appropriate. Acute OT to continue to follow.    Recommendations for follow up therapy are one component of a multi-disciplinary discharge planning process, led by the attending physician.  Recommendations may be updated based on patient status, additional functional criteria and insurance authorization.    Assistance Recommended at Discharge Intermittent Supervision/Assistance  Patient can return home with the following  A little help with walking and/or transfers;A little help with bathing/dressing/bathroom;Direct supervision/assist for medications management;Direct supervision/assist for financial management;Assist for transportation;Help with stairs or ramp for entrance   Equipment Recommendations  Other (comment) (patient declines need for DME)    Recommendations for Other Services      Precautions / Restrictions Precautions Precautions: Fall Restrictions Weight Bearing Restrictions: Yes RUE Weight Bearing: Weight bearing as tolerated       Mobility Bed Mobility Overal bed mobility: Modified Independent             General bed mobility  comments: OOB in recliner upon entry and able to return to supine without assistance at end of session    Transfers Overall transfer level: Needs assistance Equipment used: None Transfers: Sit to/from Stand Sit to Stand: Supervision           General transfer comment: patient in bathroom upon entry and states he took himself to bathroom. supervision during session for safety due to patient compaints of feeling weak and believing his blood sugar was dropping.     Balance Overall balance assessment: Mild deficits observed, not formally tested                                         ADL either performed or assessed with clinical judgement   ADL Overall ADL's : Needs assistance/impaired     Grooming: Wash/dry hands;Wash/dry face;Oral care;Supervision/safety;Standing Grooming Details (indicate cue type and reason): began grooming seated due to stating fatigue and completed in standing Upper Body Bathing: Supervision/ safety;Standing               Toilet Transfer: Modified Independent;Ambulation;Regular Teacher, adult education Details (indicate cue type and reason): Patient in bathroom upon entry and stated he took self to bathroom Toileting- Clothing Manipulation and Hygiene: Modified independent;Sitting/lateral lean         General ADL Comments: patient requiring increased time to complete self care tasks due to complaints of fatigue and stating he believes is blood sugar is dropping    Extremity/Trunk Assessment              Vision       Perception     Praxis  Cognition Arousal/Alertness: Awake/alert Behavior During Therapy: WFL for tasks assessed/performed Overall Cognitive Status: No family/caregiver present to determine baseline cognitive functioning                                 General Comments: States he feels very weak and fatigues quickly        Exercises      Shoulder Instructions       General  Comments Patient complaining of shortness of breath with SpO2 at 98 on RA    Pertinent Vitals/ Pain       Pain Assessment Pain Assessment: No/denies pain Faces Pain Scale: Hurts a little bit Pain Location: denies pain but grimacing during visit Pain Intervention(s): Monitored during session  Home Living                                          Prior Functioning/Environment              Frequency  Min 2X/week        Progress Toward Goals  OT Goals(current goals can now be found in the care plan section)  Progress towards OT goals: Progressing toward goals  Acute Rehab OT Goals Patient Stated Goal: feel better OT Goal Formulation: With patient Time For Goal Achievement: 11/26/22 Potential to Achieve Goals: Good ADL Goals Pt Will Perform Grooming: with modified independence;standing Pt Will Perform Lower Body Dressing: Independently;sit to/from stand Pt Will Transfer to Toilet: Independently;ambulating Additional ADL Goal #1: Pt will independently complete IADL medicaiton management task  Plan Discharge plan remains appropriate    Co-evaluation                 AM-PAC OT "6 Clicks" Daily Activity     Outcome Measure   Help from another person eating meals?: None Help from another person taking care of personal grooming?: A Little Help from another person toileting, which includes using toliet, bedpan, or urinal?: A Little Help from another person bathing (including washing, rinsing, drying)?: A Little Help from another person to put on and taking off regular upper body clothing?: A Little Help from another person to put on and taking off regular lower body clothing?: A Little 6 Click Score: 19    End of Session    OT Visit Diagnosis: Unsteadiness on feet (R26.81);Other abnormalities of gait and mobility (R26.89);Muscle weakness (generalized) (M62.81)   Activity Tolerance Patient tolerated treatment well   Patient Left in bed;with  call bell/phone within reach   Nurse Communication Mobility status        Time: 0981-1914 OT Time Calculation (min): 38 min  Charges: OT General Charges $OT Visit: 1 Visit OT Treatments $Self Care/Home Management : 38-52 mins  Alfonse Flavors, OTA Acute Rehabilitation Services  Office [phone]   Dewain Penning 11/14/2022, 12:59 PM

## 2022-11-14 NOTE — Consult Note (Signed)
   [facility] CM Inpatient Consult   11/14/2022  [name] 29-Jun-1985 [id number]  [facility] [[facility]]  Accountable Care OrganizatioH  *[facility] Liaison remote coverage review for patient admitted to [facility]  Primary Care Provider:  [name], DO with [facility] Medicine listed for the transition of care  Patient screened for hospitalization with noted extreme high risk score for unplanned readmission risk 7 day length of stay and to assess for potential [facility]  [[facility]] Care Management service needs for post hospital transition for care coordination.  Review of patient's electronic medical record reveals patient has 3 admissions and 5 ED visit with ESRD. Reviewed for post hospital follow up needs  Call attempts to patient were not successful and unable to leave a voice mail message currently.   Plan:    Referral request for community care coordination: will make a community follow up referral for transition  Of note, [facility] Care Management/Population Health does not replace or interfere with any arrangements made by the Inpatient Transition of Care team.  For questions contact:   Charlesetta Shanks, RN BSN CCM [facility]  [phone] business mobile phone Toll free office [phone]  *Concierge Line  [phone] Fax number: [phone] [email or web address]

## 2022-11-14 NOTE — TOC Transition Note (Signed)
Transition of Care (TOC) - CM/SW Discharge Note Donn Pierini RN, BSN Transitions of Care Unit 4E- RN Case Manager See Treatment Team for direct phone #   Patient Details  Name: [name] MRN: [id number] Date of Birth: [birth year]  Transition of Care [facility]) CM/SW Contact:  Darrold Span, RN Phone Number: 11/14/2022, 4:24 PM   Clinical Narrative:    Pt stable for transition today, CSW has placed resources on AVS for homelessness as pt has been staying in his car per report. Pt can also go to [facility] if needed.   CM received all from ex-MIL- [name] [phone])- she voiced during conversation that she is trying to assist/support where able and can provide transport if needed for discharge.   As pt does not have stable Home address, no HH services can be provided. No DME needs noted.  Pt has outpt HD- next treatment tomorrow.  No further TOC needs noted.   Final next level of care: Home/Self Care Barriers to Discharge: Barriers Resolved   Patient Goals and CMS Choice CMS Medicare.gov Compare Post Acute Care list provided to:: Patient Choice offered to / list presented to : Patient, Parent  Discharge Placement                 Self care        Discharge Plan and Services Additional resources added to the After Visit Summary for   In-house Referral: Clinical Social Work Discharge Planning Services: CM Consult Post Acute Care Choice: NA          DME Arranged: N/A DME Agency: NA       HH Arranged: NA HH Agency: NA        Social Determinants of Health (SDOH) Interventions SDOH Screenings   Food Insecurity: No Food Insecurity (09/23/2022)  Housing: Low Risk  (09/23/2022)  Transportation Needs: No Transportation Needs (09/23/2022)  Utilities: Not At Risk (09/23/2022)  Depression (PHQ2-9): Low Risk  (09/22/2022)  Tobacco Use: High Risk (11/13/2022)     Readmission Risk Interventions     No data to display

## 2022-11-14 NOTE — Progress Notes (Signed)
Pt does not want to be discharged. He has asked for medical doctor to come and see him because he does not feel right. He is concerned that his blood sugar keeps dropping and he wants more education on why it is happening. He feels like because his blood has been low and not working with physical therapy it is a problem. States he is worried about going home and his legs giving out on him. Paged vascular PA. PA McKenzie in [place]. Will page again. Pt resting with call bell within reach.  Will continue to monitor.

## 2022-11-14 NOTE — Progress Notes (Signed)
[facility] Progress Note   Subjective:  Seen in room - woke from sleeping. No issues with dialysis yesterday - net UF 4L. Reports throbbing pain in R arm last night. Better with pain meds. Asking for O2 - nursing staff alerted.    Objective Vitals:   11/13/22 1654 11/13/22 2123 11/14/22 0057 11/14/22 0701  BP: (!) 150/95 (!) 163/95 (!) 142/84 (!) 160/97  Pulse: 87 92 93 89  Resp: 19 19 18 18   Temp:  97.9 F (36.6 C) 98 F (36.7 C) 97.9 F (36.6 C)  TempSrc: Oral Oral Oral Oral  SpO2: 100% 96% 96% 100%  Weight:      Height:       Physical Exam General: NAD, RA  Heart: RRR; no murmur Lungs: CTAB Abdomen: soft Extremities: no LE edema, RUE with edema and + bruit heard  Dialysis Access: [facility] in R chest  Additional Objective Labs: Basic Metabolic Panel: Recent Labs  Lab 11/08/22 1134 11/10/22 0904 11/12/22 0037 11/13/22 0649  NA 137 131* 128* 125*  K 3.9 4.3 4.5 4.3  CL 93* 90* 90* 86*  CO2 25 25 26 24   GLUCOSE 83 98 100* 113*  BUN 46* 65* 55* 83*  CREATININE 8.82* 11.70* 9.73* 12.82*  CALCIUM 9.7 8.6* 8.4* 8.6*  PHOS 7.1*  --  7.7* 7.4*    Liver Function Tests: Recent Labs  Lab 11/08/22 1134 11/12/22 0037 11/13/22 0649  ALBUMIN 3.2* 2.6* 2.7*    CBC: Recent Labs  Lab 11/08/22 1134 11/09/22 0126 11/10/22 0904 11/12/22 0037 11/13/22 0649 11/13/22 1422  WBC 7.9 7.3 6.9 6.0 5.1  --   HGB 7.9* 7.7* 8.4* 7.5* 7.1* 8.7*  HCT 24.7* 24.4* 25.7* 22.5* 20.8* 25.3*  MCV 98.0 100.4* 98.1 96.2 93.7  --   PLT 209 213 251 225 224  --     Cardiac Enzymes: Recent Labs  Lab 11/08/22 0109  CKTOTAL 422*  CKMB 2.2    CBG: Recent Labs  Lab 11/13/22 0524 11/13/22 0748 11/13/22 1659 11/13/22 2120 11/14/22 0621  GLUCAP 106* 113* 103* 131* 71    Medications:  sodium chloride      sodium chloride   Intravenous Once   amLODipine  10 mg Oral Daily   calcium acetate  667 mg Oral TID WC   calcium carbonate  1 tablet Oral TID WC    Chlorhexidine Gluconate Cloth  6 each Topical Q0600   Chlorhexidine Gluconate Cloth  6 each Topical Q0600   cloNIDine  0.3 mg Oral TID   divalproex  500 mg Oral QHS   feeding supplement  237 mL Oral BID BM   ferric citrate  210 mg Oral TID WC   hydrALAZINE  100 mg Oral TID   labetalol  100 mg Oral BID   losartan  100 mg Oral Daily   pantoprazole  40 mg Oral BID   sucralfate  1 g Oral BID    Dialysis Orders: [facility], 400/A1.5, EDW 73.5kg, 2K/2Ca, AVF -> now TDC, heparin 1200U bolus - Mircera IV q 2 weeks - last 6/25 - due 7/9. 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 ESRD: S/p extra HD 6/28, then back to usual. Next HD Sat HTN/volume: BP high, prior pulm edema on CXR resolved although now appears to be way up again. Tolerated 4L UF Thurs. Weights up -- UF to EDW as  able.  Anemia of ESRD: In addition to post-op drop, not due for ESA yet. Transfused 1 U prbcs 7/4.  Secondary hyperparathyroidism: Ca ok, Phos high - continue home binders. Nutrition:  Alb low, continue renal diet. Hearing loss  Tomasa Blase PA-C [facility] 11/14/2022,9:12 AM

## 2022-11-14 NOTE — Progress Notes (Signed)
Per RN, pt is experiencing homelessness at this time and may need a place to stay at dc. Shelter list added to pt's AVS. Pt is able to dc to the [facility]) at dc if needed. No other needs reported.   Dellie Burns, MSW, LCSW [phone] (coverage)

## 2022-11-14 NOTE — Progress Notes (Signed)
FMTS Progress Note  Went to speak with patient at his request. Our service signed off this patient on 7/4 after successfully managing his blood pressure.   Patient is very concerned about his low blood sugars, however he has been within a normal range since 6/30. I did my best to reassure the patient that his symptoms of weakness and fatigue was not due to his sugars and that he was not a diabetic. Patient convinced his symptoms are related to his blood glucose.   I suspect that his lowered blood pressure is causing him to feel weak and lethargic. He reports having uncontrolled hypertension for years and usually lives around 180-190s. We have follow up scheduled with our clinic on 7/10 with Dr. Sherrilee Gilles. If he continues to have persistent symptoms, perhaps we can back off on his blood pressure control then.   At this time, he is medically stable for discharge with outpatient follow up. I do not believe there is anything else we will be able to offer him inpatient.   Glendale Chard, DO 11/14/2022, 1:24 PM PGY-2, [facility] Family Medicine Service pager [phone]

## 2022-11-14 NOTE — Progress Notes (Signed)
Pt is concerned about blood glucose levels dropping. I have spoken with him before concerning this and this is an ongoing issue for him. Today cbg 71 before eating breakfast and juice and cbg 86 and gave grape juice with recheck at cbg 91.  Pt is not sure if blood glucose is causing him "to feel funny" or if his blood is "low again" because he had to get blood in HD. Thomas Hoff, RN Will notify vascular, nephrology and Family Medicine Pt resting with call bell within reach.  Will continue to monitor.

## 2022-11-14 NOTE — Plan of Care (Signed)
[facility] Patient Discharge Orders- [facility] CLINIC: [facility]  Patient's name: [name] Admit/DC Dates: 11/07/2022 - 11/14/2022  Discharge Diagnoses: R AVF aneurysm s/p plication of aneurysm with AVG jump graft  Dysphasia s/p EGD with dilation  Hypertensive urgency   Aranesp: Given: --   Date and amount of last dose: --  Last Hgb: 8.7 PRBC's Given: Yes Date/# of units: 7/4 1 unit  ESA dose for discharge: mircera 150 mcg IV q 2 weeks  IV Iron dose at discharge: --  Heparin change: --  EDW Change: -- New EDW: No change   Bath Change: --  Access intervention/Change: Yes Details: Excision of large aneurysmal/AVF revision with jump graft; Placement of R internal jugular TDC --11/07/22 Dr. Edilia Bo  Hectorol/Calcitriol change: --  Discharge Labs: Calcium 8.6 Phosphorus 7.4 Albumin 2.7 K+ 4.3  IV Antibiotics: -- Details:  On Coumadin?: -- Last INR: Next INR: Managed By:   OTHER/APPTS/LAB ORDERS: Per Dr. Edilia Bo, will not be able to use access for at least 2 months    D/C Meds to be reconciled by nurse after every discharge.  Completed By: Tomasa Blase PA-C 11/14/2022,3:52 PM  Reviewed by: MD:______ RN_______

## 2022-11-15 ENCOUNTER — Telehealth: Payer: Self-pay | Admitting: Nephrology

## 2022-11-15 NOTE — Telephone Encounter (Signed)
Transition of care contact from inpatient facility  Date of Discharge: 11/14/22 Date of Contact: 11/15/22 Method of contact: Phone  Attempted to contact patient to discuss transition of care from inpatient admission. Patient did not answer the phone. Will try to reach at outpatient dialysis.

## 2022-11-16 ENCOUNTER — Encounter: Payer: Self-pay | Admitting: Gastroenterology

## 2022-11-17 ENCOUNTER — Encounter (HOSPITAL_COMMUNITY): Payer: Self-pay

## 2022-11-17 ENCOUNTER — Emergency Department (HOSPITAL_COMMUNITY): Payer: Medicare Other

## 2022-11-17 ENCOUNTER — Other Ambulatory Visit: Payer: Self-pay

## 2022-11-17 ENCOUNTER — Emergency Department (HOSPITAL_COMMUNITY)
Admission: EM | Admit: 2022-11-17 | Discharge: 2022-11-18 | Disposition: A | Discharge status: Home / Self Care | Payer: Medicare Other | Attending: Emergency Medicine | Admitting: Emergency Medicine

## 2022-11-17 DIAGNOSIS — I5020 Unspecified systolic (congestive) heart failure: Secondary | ICD-10-CM | POA: Diagnosis not present

## 2022-11-17 DIAGNOSIS — Q8781 Alport syndrome: Secondary | ICD-10-CM | POA: Insufficient documentation

## 2022-11-17 DIAGNOSIS — R531 Weakness: Secondary | ICD-10-CM

## 2022-11-17 DIAGNOSIS — Z79899 Other long term (current) drug therapy: Secondary | ICD-10-CM | POA: Insufficient documentation

## 2022-11-17 DIAGNOSIS — E875 Hyperkalemia: Secondary | ICD-10-CM | POA: Insufficient documentation

## 2022-11-17 DIAGNOSIS — I132 Hypertensive heart and chronic kidney disease with heart failure and with stage 5 chronic kidney disease, or end stage renal disease: Secondary | ICD-10-CM | POA: Diagnosis not present

## 2022-11-17 DIAGNOSIS — R0602 Shortness of breath: Secondary | ICD-10-CM | POA: Insufficient documentation

## 2022-11-17 DIAGNOSIS — E871 Hypo-osmolality and hyponatremia: Secondary | ICD-10-CM | POA: Insufficient documentation

## 2022-11-17 DIAGNOSIS — N186 End stage renal disease: Secondary | ICD-10-CM | POA: Diagnosis not present

## 2022-11-17 DIAGNOSIS — Z992 Dependence on renal dialysis: Secondary | ICD-10-CM | POA: Insufficient documentation

## 2022-11-17 DIAGNOSIS — D649 Anemia, unspecified: Secondary | ICD-10-CM | POA: Diagnosis not present

## 2022-11-17 DIAGNOSIS — R202 Paresthesia of skin: Secondary | ICD-10-CM | POA: Insufficient documentation

## 2022-11-17 DIAGNOSIS — R600 Localized edema: Secondary | ICD-10-CM | POA: Insufficient documentation

## 2022-11-17 DIAGNOSIS — I120 Hypertensive chronic kidney disease with stage 5 chronic kidney disease or end stage renal disease: Secondary | ICD-10-CM | POA: Insufficient documentation

## 2022-11-17 DIAGNOSIS — F4521 Hypochondriasis: Secondary | ICD-10-CM | POA: Diagnosis not present

## 2022-11-17 DIAGNOSIS — R5383 Other fatigue: Secondary | ICD-10-CM | POA: Diagnosis present

## 2022-11-17 LAB — BASIC METABOLIC PANEL
Anion gap: 18 — ABNORMAL HIGH (ref 5–15)
BUN: 114 mg/dL — ABNORMAL HIGH (ref 6–20)
CO2: 19 mmol/L — ABNORMAL LOW (ref 22–32)
Calcium: 8.3 mg/dL — ABNORMAL LOW (ref 8.9–10.3)
Chloride: 91 mmol/L — ABNORMAL LOW (ref 98–111)
Creatinine, Ser: 16.17 mg/dL — ABNORMAL HIGH (ref 0.61–1.24)
GFR, Estimated: 4 mL/min — ABNORMAL LOW (ref >60)
Glucose, Bld: 89 mg/dL (ref 70–99)
Potassium: 6.6 mmol/L (ref 3.5–5.1)
Sodium: 128 mmol/L — ABNORMAL LOW (ref 135–145)

## 2022-11-17 LAB — I-STAT VENOUS BLOOD GAS, ED
Acid-base deficit: 1 mmol/L (ref 0.0–2.0)
Bicarbonate: 23 mmol/L (ref 20.0–28.0)
Calcium, Ion: 1 mmol/L — ABNORMAL LOW (ref 1.15–1.40)
HCT: 24 % — ABNORMAL LOW (ref 39.0–52.0)
Hemoglobin: 8.2 g/dL — ABNORMAL LOW (ref 13.0–17.0)
O2 Saturation: 92 %
Potassium: 7.2 mmol/L (ref 3.5–5.1)
Sodium: 124 mmol/L — ABNORMAL LOW (ref 135–145)
TCO2: 24 mmol/L (ref 22–32)
pCO2, Ven: 32.1 mmHg — ABNORMAL LOW (ref 44–60)
pH, Ven: 7.463 — ABNORMAL HIGH (ref 7.25–7.43)
pO2, Ven: 60 mmHg — ABNORMAL HIGH (ref 32–45)

## 2022-11-17 LAB — CBC
HCT: 23.8 % — ABNORMAL LOW (ref 39.0–52.0)
Hemoglobin: 7.8 g/dL — ABNORMAL LOW (ref 13.0–17.0)
MCH: 30.6 pg (ref 26.0–34.0)
MCHC: 32.8 g/dL (ref 30.0–36.0)
MCV: 93.3 fL (ref 80.0–100.0)
Platelets: 264 10*3/uL (ref 150–400)
RBC: 2.55 MIL/uL — ABNORMAL LOW (ref 4.22–5.81)
RDW: 15.5 % (ref 11.5–15.5)
WBC: 6.3 10*3/uL (ref 4.0–10.5)
nRBC: 0 % (ref 0.0–0.2)

## 2022-11-17 LAB — CBG MONITORING, ED: Glucose-Capillary: 123 mg/dL — ABNORMAL HIGH (ref 70–99)

## 2022-11-17 MED ORDER — HEPARIN SODIUM (PORCINE) 1000 UNIT/ML DIALYSIS
1200.0000 [IU] | INTRAMUSCULAR | Status: DC | PRN
  Administered 2022-11-17: 3200 [IU] via INTRAVENOUS_CENTRAL
  Filled 2022-11-17: qty 2

## 2022-11-17 MED ORDER — SODIUM ZIRCONIUM CYCLOSILICATE 10 G PO PACK
10.0000 g | PACK | Freq: Once | ORAL | Status: AC
  Administered 2022-11-17: 10 g via ORAL
  Filled 2022-11-17: qty 1

## 2022-11-17 MED ORDER — CHLORHEXIDINE GLUCONATE CLOTH 2 % EX PADS: 6.0000 | MEDICATED_PAD | Freq: Every day | CUTANEOUS | Status: DC

## 2022-11-17 MED ORDER — LORAZEPAM 2 MG/ML IJ SOLN
1.0000 mg | Freq: Once | INTRAMUSCULAR | Status: AC
  Administered 2022-11-17: 1 mg via INTRAVENOUS
  Filled 2022-11-17: qty 1

## 2022-11-17 MED ORDER — LORAZEPAM 2 MG/ML IJ SOLN
0.5000 mg | Freq: Once | INTRAMUSCULAR | Status: AC
  Administered 2022-11-17: 0.5 mg via INTRAVENOUS
  Filled 2022-11-17: qty 1

## 2022-11-17 MED ORDER — CALCIUM GLUCONATE-NACL 1-0.675 GM/50ML-% IV SOLN
1.0000 g | Freq: Once | INTRAVENOUS | Status: AC
  Administered 2022-11-17: 1000 mg via INTRAVENOUS
  Filled 2022-11-17: qty 50

## 2022-11-17 NOTE — ED Provider Notes (Signed)
  Physical Exam  BP (!) 178/105 (BP Location: Left Leg)   Pulse 91   Temp 98.4 F (36.9 C) (Oral)   Resp 18   SpO2 98%   Physical Exam Vitals and nursing note reviewed.  Constitutional:      General: He is not in acute distress.    Appearance: Normal appearance.  HENT:     Mouth/Throat:     Mouth: Mucous membranes are moist.  Eyes:     Conjunctiva/sclera: Conjunctivae normal.  Cardiovascular:     Rate and Rhythm: Normal rate and regular rhythm.  Pulmonary:     Effort: Pulmonary effort is normal. No respiratory distress.     Breath sounds: Normal breath sounds.  Abdominal:     General: Abdomen is flat.     Palpations: Abdomen is soft.     Tenderness: There is no abdominal tenderness.  Musculoskeletal:     Right lower leg: No edema.     Left lower leg: No edema.     Comments: RUE surgical site clean dry and intact. 2+ distal radial pulse  Skin:    General: Skin is warm and dry.     Capillary Refill: Capillary refill takes less than 2 seconds.  Neurological:     Mental Status: [name] and oriented to person, place, and time. Mental status is at baseline.  Psychiatric:        Mood and Affect: Mood normal.        Behavior: Behavior normal.     Procedures  Procedures  ED Course / MDM    Patient returned from dialysis.  He reports feeling mildly better.  He is also complaining about the blood pressure medications he was discharged with when he left the hospital.  He is concerned that the labetalol is causing him to have a negative reaction.  He reports it makes him feel anxious.  Advised that he should follow-up with his primary care physician to further manage his blood pressure medications and follow-up with his vascular surgeon regarding his right upper extremity fistula.  He did complete a session of dialysis and also has a session of dialysis tomorrow morning so I do not think he needs recheck of his electrolytes at this time.  Medical Decision Making Amount  and/or Complexity of Data Reviewed Labs: ordered. Decision-making details documented in ED Course. Radiology: ordered.  Risk Prescription drug management.          Lonell Grandchild, MD 11/17/22 2039

## 2022-11-17 NOTE — ED Notes (Signed)
Abigail Harris PA shown results of Istat VBG. ED-Lab 

## 2022-11-17 NOTE — ED Triage Notes (Signed)
Pt arrived from home via [facility] c/o generally not feeling well, feeling fatigued, and just not like himself. Pt states that he has felt bad since he was discharged from inpatient care on Friday. Pt states that he is very swollen and that it burns when anyone touches him. Pt missed dialysis on Sat d/t feeling so bad.

## 2022-11-17 NOTE — Procedures (Signed)
I was present at this dialysis session, have reviewed the session and made  appropriate changes Vinson Moselle MD  [facility] 11/17/2022, 4:44 PM

## 2022-11-17 NOTE — ED Provider Notes (Signed)
EMERGENCY DEPARTMENT AT [facility] Provider Note   CSN: [id number] Arrival date & time: 11/17/22  8657     History  Chief Complaint  Patient presents with   Fatigue    [name] is a 37 y.o. male who presents with Fatigue. He was admitted by [facility] for TCD placement and excision of aneurysmal fistula on 6/28, he has a HX of ESRD and a longstanding hx of severe uncontrolled HTN. He was complaining of fatigue at discharge which was thought to be due to improved blood pressure.  Patient states "every time I wake up I feel like I am waking up from death."  He states that he has felt like this ever since they "started be on this new medicine."  He complains that he feels short of breath and that his face and arms are tingling and he feels dizzy.  Patient also complains of swelling and burning in his lower extremities.  Patient states "I told them not to release me from the hospital because I still did not feel well."  Patient denies fevers or chills.   HPI     Home Medications Prior to Admission medications   Medication Sig Start Date End Date Taking? Authorizing Provider  acetaminophen (TYLENOL) 325 MG tablet Take 2 tablets (650 mg total) by mouth every 6 (six) hours as needed for mild pain (or Fever >/= 101). 04/14/22   Lockie Mola, MD  amLODipine (NORVASC) 10 MG tablet TAKE 1 TABLET BY MOUTH EVERY DAY 04/10/22   Sabino Dick, DO  calcium acetate (PHOSLO) 667 MG capsule Take 1 capsule (667 mg total) by mouth 3 (three) times daily with meals. 01/04/22   Vonna Drafts, MD  cloNIDine (CATAPRES) 0.3 MG tablet Take 1 tablet (0.3 mg total) by mouth 3 (three) times daily. 11/14/22   Baglia, Corrina, PA-C  divalproex (DEPAKOTE ER) 500 MG 24 hr tablet Take 1 tablet (500 mg total) by mouth at bedtime. 08/05/22   Sabino Dick, DO  ferric citrate (AURYXIA) 1 GM 210 MG(Fe) tablet Take 210 mg by mouth 3 (three) times daily with meals. 07/26/19   [provider]  hydrALAZINE (APRESOLINE) 100 MG tablet Take 1 tablet (100 mg total) by mouth 3 (three) times daily. 05/20/22   Gaston Islam., NP  labetalol (NORMODYNE) 100 MG tablet Take 1 tablet (100 mg total) by mouth 2 (two) times daily. 11/14/22   Baglia, Corrina, PA-C  losartan (COZAAR) 100 MG tablet Take 1 tablet (100 mg total) by mouth daily. 11/15/22   Baglia, Corrina, PA-C  oxyCODONE-acetaminophen (PERCOCET) 5-325 MG tablet Take 1 tablet by mouth every 6 (six) hours as needed for severe pain. 11/09/22 11/09/23  Baglia, Corrina, PA-C  pantoprazole (PROTONIX) 40 MG tablet TAKE 1 TABLET BY MOUTH EVERY DAY Patient taking differently: Take 40 mg by mouth 2 (two) times daily. 09/17/22   Sabino Dick, DO  sucralfate (CARAFATE) 1 g tablet Take 1 tablet (1 g total) by mouth 2 (two) times daily. 11/14/22   Baglia, Corrina, PA-C  SUMAtriptan (IMITREX) 50 MG tablet Take 1 tablet (50 mg total) by mouth every 2 (two) hours as needed for migraine. Do not take more than 4 tablets in 24 hours. 08/05/22   Sabino Dick, DO  triamcinolone ointment (KENALOG) 0.1 % Apply to scrotum twice daily for itching. Do not use for more than 2 weeks continuously 05/30/22   Sabino Dick, DO      Allergies    Zestril [lisinopril], Nsaids,  Carvedilol, and Risperdal [risperidone]    Review of Systems   Review of Systems  Physical Exam Updated Vital Signs BP (!) 173/103 (BP Location: Left Arm)   Pulse 93   Temp 99.1 F (37.3 C) (Oral)   Resp 19   SpO2 99%  Physical Exam Constitutional:      Appearance: He is not ill-appearing.  Neck:     Vascular: No JVD.     Comments: hyperventilating Pulmonary:     Breath sounds: Normal breath sounds. No wheezing or rales.  Musculoskeletal:     Right lower leg: Edema present.     Left lower leg: Edema present.     Comments: R arm with well healing surgical site. R arm is swollen as compared to the left.  Neurological:     Mental Status: [name].     ED Results /  Procedures / Treatments   Labs (all labs ordered are listed, but only abnormal results are displayed) Labs Reviewed  BASIC METABOLIC PANEL - Abnormal; Notable for the following components:      Result Value   Sodium 128 (*)    Potassium 6.6 (*)    Chloride 91 (*)    CO2 19 (*)    BUN 114 (*)    Creatinine, Ser 16.17 (*)    Calcium 8.3 (*)    GFR, Estimated 4 (*)    Anion gap 18 (*)    All other components within normal limits  CBC - Abnormal; Notable for the following components:   RBC 2.55 (*)    Hemoglobin 7.8 (*)    HCT 23.8 (*)    All other components within normal limits  BASIC METABOLIC PANEL - Abnormal; Notable for the following components:   Sodium 130 (*)    Potassium 5.2 (*)    Chloride 91 (*)    BUN 64 (*)    Creatinine, Ser 11.55 (*)    Calcium 8.5 (*)    GFR, Estimated 5 (*)    All other components within normal limits  I-STAT VENOUS BLOOD GAS, ED - Abnormal; Notable for the following components:   pH, Ven 7.463 (*)    pCO2, Ven 32.1 (*)    pO2, Ven 60 (*)    Sodium 124 (*)    Potassium 7.2 (*)    Calcium, Ion 1.00 (*)    HCT 24.0 (*)    Hemoglobin 8.2 (*)    All other components within normal limits  CBG MONITORING, ED - Abnormal; Notable for the following components:   Glucose-Capillary 123 (*)    All other components within normal limits    EKG EKG Interpretation Date/Time:  Monday November 17 2022 07:36:46 EDT Ventricular Rate:  82 PR Interval:  178 QRS Duration:  88 QT Interval:  390 QTC Calculation: 456 R Axis:   18  Text Interpretation: Sinus rhythm Confirmed by Benjiman Core [phone]) on 11/17/2022 8:01:44 AM  Radiology DG Chest 1 View  Result Date: 11/18/2022 CLINICAL DATA:  [id number] Chest pain [id number] EXAM: CHEST  1 VIEW COMPARISON:  CXR 11/17/22 FINDINGS: Right-sided central venous catheter in place with tip near cavoatrial junction. Cardiomegaly. No pleural effusion. No pneumothorax. No focal airspace opacity. No radiographically apparent  displaced rib fractures. Visualized upper abdomen is unremarkable. IMPRESSION: Cardiomegaly.  No focal airspace opacity. Electronically Signed   By: Lorenza Cambridge M.D.   On: 11/18/2022 12:27    Procedures .Critical Care  Performed by: Arthor Captain, PA-C Authorized by: Arthor Captain, PA-C  Critical care provider statement:    Critical care time (minutes):  30   Critical care time was exclusive of:  Separately billable procedures and treating other patients   Critical care was necessary to treat or prevent imminent or life-threatening deterioration of the following conditions:  Metabolic crisis (hyperkalemia)   Critical care was time spent personally by me on the following activities:  Development of treatment plan with patient or surrogate, discussions with consultants, evaluation of patient's response to treatment, examination of patient, ordering and review of laboratory studies, ordering and review of radiographic studies, ordering and performing treatments and interventions, pulse oximetry, re-evaluation of patient's condition and review of old charts     Medications Ordered in ED Medications  calcium gluconate 1 g/ 50 mL sodium chloride IVPB (0 mg Intravenous Stopped 11/17/22 0859)  sodium zirconium cyclosilicate (LOKELMA) packet 10 g (10 g Oral Given 11/17/22 0820)  LORazepam (ATIVAN) injection 0.5 mg (0.5 mg Intravenous Given 11/17/22 1447)  LORazepam (ATIVAN) injection 1 mg (1 mg Intravenous Given 11/17/22 2105)    ED Course/ Medical Decision Making/ A&P Clinical Course as of 11/20/22 0955  Mon Nov 17, 2022  0759 pCO2, Ven(!): 32.1 Patient's VBG shows an uncompensated respiratory alkalosis [AH]  0759 Potassium(!!): 7.2 Along with a critically elevated potassium level will wait for confirmation through BMP as this could be due to hemolysis. [AH]  0800 Hemoglobin(!): 8.2 Patient's hemoglobin appears to be baseline [AH]  0800 Patient's VBG is consistent with respiratory alkalosis.   I think this is the reason the patient has perioral and bilateral carpal numbness and lightheadedness.  I have advised the patient that he needs to stop hyperventilating as this is the cause of him feeling so bad. [AH]  0802 EKG shows new peaked T waves. Based on this finding- will tx for Hyperk  [AH]  0818 Hemoglobin(!): 7.8 [AH]  0819 Hemoglobin(!): 7.8 [AH]  0844 Sodium(!): 128 [AH]  0844 Potassium(!!): 6.6 [AH]  0844 CO2(!): 19 [AH]  0845 Anion gap(!): 18 [AH]    Clinical Course User Index [AH] Arthor Captain, PA-C                             Medical Decision Making Amount and/or Complexity of Data Reviewed Labs: ordered. Decision-making details documented in ED Course. Radiology: ordered.  Risk Prescription drug management.    This patient presents to the ED for concern of fatigue and sob., this involves an extensive number of treatment options, and is a complaint that carries with it a high risk of complications and morbidity.  The differential diagnosis of weakness includes but is not limited to neurologic causes (GBS, myasthenia gravis, CVA, MS, ALS, transverse myelitis, spinal cord injury, CVA, botulism, ) and other causes: ACS, Arrhythmia, syncope, orthostatic hypotension, sepsis, hypoglycemia, electrolyte disturbance, hypothyroidism, respiratory failure, symptomatic anemia, dehydration, heat injury, polypharmacy, malignancy. The emergent differential diagnosis for shortness of breath includes, but is not limited to, Pulmonary edema, bronchoconstriction, Pneumonia, Pulmonary embolism, Pneumotherax/ Hemothorax, Dysrythmia, ACS.    Co morbidities: ESRD uncontrolled hypertension  Social Determinants of Health:  possible housing inssues  Additional history:  emr   Lab Tests:  I Ordered, and personally interpreted labs.  The pertinent results include:   Per ED course  Imaging Studies:  I ordered imaging studies including cxr I independently visualized and  interpreted imaging which showed no edema I agree with the radiologist interpretation  Cardiac Monitoring/ECG:  The patient was maintained on  a cardiac monitor.  I personally viewed and interpreted the cardiac monitored which showed an underlying rhythm of:  Sinus rhythm , elevated T waves  Medicines ordered and prescription drug management:  I ordered medication including  Medications  calcium gluconate 1 g/ 50 mL sodium chloride IVPB (0 mg Intravenous Stopped 11/17/22 0859)  sodium zirconium cyclosilicate (LOKELMA) packet 10 g (10 g Oral Given 11/17/22 0820)  LORazepam (ATIVAN) injection 0.5 mg (0.5 mg Intravenous Given 11/17/22 1447)  LORazepam (ATIVAN) injection 1 mg (1 mg Intravenous Given 11/17/22 2105)   For hyperkalemia - anxiety Reevaluation of the patient after these medicines showed that the patient improved I have reviewed the patients home medicines and have made adjustments as needed  Test Considered:    Critical Interventions:  acute mgmgt of hyoerkalemia  Consultations Obtained: DR schertz, needs dialysis, not admission  Problem List / ED Course:     ICD-10-CM   1. Generalized weakness  R53.1     2. Hyperkalemia  E87.5       MDM: patient here with multiple complaints Hyperkalemia noted - missed dialysis, tx with Calciuma dn lokelma- needs dialysis Patient sob due to hyperventilation. No volume overload and no underlyacidosis Expect dc after dialysis           Final Clinical Impression(s) / ED Diagnoses Final diagnoses:  Generalized weakness  Hyperkalemia    Rx / DC Orders ED Discharge Orders     None         Arthor Captain, PA-C 11/20/22 1016    Benjiman Core, MD 11/24/22 1454

## 2022-11-17 NOTE — Progress Notes (Signed)
Asked to see patient for dialysis. K+ is high. Pt here for gen'd fatigue. Was in hospital recently for AVF plication, AVG jump graft and TDC placement.  Per ED staff pt does not require admission. K+ is high however and will need dialysis. Exam is negative except for R arm swelling which is not new after recent surgery. Plan is for "ED HD" upstairs this afternoon. Patient will be sent back to ED for reassessment after HD session is completed.   TTS at [facility] 4[facility], 400/A1.5, EDW 73.5kg, 2K/2Ca, AVF -> now [facility], heparin 1200U bolus - Mircera IV q 2 weeks - last 6/25 - due 7/9. - Calcitriol PO q HD    Rob Jessalyn Hinojosa  MD  [facility] 11/17/2022, 4:44 PM  Recent Labs  Lab 11/12/22 0037 11/13/22 0649 11/13/22 1422 11/17/22 0745 11/17/22 0755  HGB 7.5* 7.1*   < > 7.8* 8.2*  ALBUMIN 2.6* 2.7*  --   --   --   CALCIUM 8.4* 8.6*  --  8.3*  --   PHOS 7.7* 7.4*  --   --   --   CREATININE 9.73* 12.82*  --  16.17*  --   K 4.5 4.3  --  6.6* 7.2*   < > = values in this interval not displayed.    Inpatient medications:  Chlorhexidine Gluconate Cloth  6 each Topical Q0600    heparin

## 2022-11-17 NOTE — ED Notes (Signed)
Diet tray ordered 19:31

## 2022-11-17 NOTE — Discharge Instructions (Addendum)
Please follow up with your primary doctor regarding your blood pressure medications. If you would like to have them changed your primary doctor should be the one to make these adjustments.  Please go to your dialysis appointment tomorrow to avoid any worsening of your symptoms.

## 2022-11-18 ENCOUNTER — Emergency Department (HOSPITAL_COMMUNITY): Payer: Medicare Other

## 2022-11-18 ENCOUNTER — Encounter (HOSPITAL_COMMUNITY): Payer: Self-pay | Admitting: Emergency Medicine

## 2022-11-18 ENCOUNTER — Emergency Department (HOSPITAL_COMMUNITY)
Admission: EM | Admit: 2022-11-18 | Discharge: 2022-11-18 | Disposition: A | Discharge status: Home / Self Care | Payer: Medicare Other | Source: Home / Self Care | Attending: Emergency Medicine | Admitting: Emergency Medicine

## 2022-11-18 ENCOUNTER — Other Ambulatory Visit: Payer: Self-pay

## 2022-11-18 ENCOUNTER — Ambulatory Visit: Payer: Self-pay | Admitting: Student

## 2022-11-18 DIAGNOSIS — E875 Hyperkalemia: Secondary | ICD-10-CM | POA: Diagnosis not present

## 2022-11-18 DIAGNOSIS — I509 Heart failure, unspecified: Secondary | ICD-10-CM | POA: Insufficient documentation

## 2022-11-18 DIAGNOSIS — I132 Hypertensive heart and chronic kidney disease with heart failure and with stage 5 chronic kidney disease, or end stage renal disease: Secondary | ICD-10-CM | POA: Insufficient documentation

## 2022-11-18 DIAGNOSIS — N186 End stage renal disease: Secondary | ICD-10-CM | POA: Insufficient documentation

## 2022-11-18 DIAGNOSIS — Z992 Dependence on renal dialysis: Secondary | ICD-10-CM | POA: Insufficient documentation

## 2022-11-18 DIAGNOSIS — F4521 Hypochondriasis: Secondary | ICD-10-CM | POA: Insufficient documentation

## 2022-11-18 DIAGNOSIS — I10 Essential (primary) hypertension: Secondary | ICD-10-CM

## 2022-11-18 DIAGNOSIS — R202 Paresthesia of skin: Secondary | ICD-10-CM | POA: Insufficient documentation

## 2022-11-18 DIAGNOSIS — R4589 Other symptoms and signs involving emotional state: Secondary | ICD-10-CM

## 2022-11-18 LAB — BASIC METABOLIC PANEL
Anion gap: 15 (ref 5–15)
Anion gap: 17 — ABNORMAL HIGH (ref 5–15)
BUN: 64 mg/dL — ABNORMAL HIGH (ref 6–20)
BUN: 66 mg/dL — ABNORMAL HIGH (ref 6–20)
CO2: 24 mmol/L (ref 22–32)
CO2: 26 mmol/L (ref 22–32)
Calcium: 8.5 mg/dL — ABNORMAL LOW (ref 8.9–10.3)
Calcium: 8.9 mg/dL (ref 8.9–10.3)
Chloride: 91 mmol/L — ABNORMAL LOW (ref 98–111)
Chloride: 88 mmol/L — ABNORMAL LOW (ref 98–111)
Creatinine, Ser: 11.55 mg/dL — ABNORMAL HIGH (ref 0.61–1.24)
Creatinine, Ser: 12.01 mg/dL — ABNORMAL HIGH (ref 0.61–1.24)
GFR, Estimated: 5 mL/min — ABNORMAL LOW (ref >60)
GFR, Estimated: 5 mL/min — ABNORMAL LOW (ref >60)
Glucose, Bld: 72 mg/dL (ref 70–99)
Glucose, Bld: 131 mg/dL — ABNORMAL HIGH (ref 70–99)
Potassium: 5.2 mmol/L — ABNORMAL HIGH (ref 3.5–5.1)
Potassium: 4.8 mmol/L (ref 3.5–5.1)
Sodium: 130 mmol/L — ABNORMAL LOW (ref 135–145)
Sodium: 131 mmol/L — ABNORMAL LOW (ref 135–145)

## 2022-11-18 LAB — CBC
HCT: 24.9 % — ABNORMAL LOW (ref 39.0–52.0)
Hemoglobin: 8.3 g/dL — ABNORMAL LOW (ref 13.0–17.0)
MCH: 31.2 pg (ref 26.0–34.0)
MCHC: 33.3 g/dL (ref 30.0–36.0)
MCV: 93.6 fL (ref 80.0–100.0)
Platelets: 329 10*3/uL (ref 150–400)
RBC: 2.66 MIL/uL — ABNORMAL LOW (ref 4.22–5.81)
RDW: 15.3 % (ref 11.5–15.5)
WBC: 5.3 10*3/uL (ref 4.0–10.5)
nRBC: 0 % (ref 0.0–0.2)

## 2022-11-18 LAB — MAGNESIUM: Magnesium: 2.1 mg/dL (ref 1.7–2.4)

## 2022-11-18 LAB — TROPONIN I (HIGH SENSITIVITY): Troponin I (High Sensitivity): 25 ng/L — ABNORMAL HIGH (ref <18)

## 2022-11-18 MED ORDER — LABETALOL HCL 200 MG PO TABS
100.0000 mg | ORAL_TABLET | Freq: Once | ORAL | Status: AC
  Administered 2022-11-18: 100 mg via ORAL
  Filled 2022-11-18: qty 1

## 2022-11-18 MED ORDER — HYDRALAZINE HCL 50 MG PO TABS
100.0000 mg | ORAL_TABLET | Freq: Once | ORAL | Status: AC
  Administered 2022-11-18: 100 mg via ORAL
  Filled 2022-11-18: qty 2

## 2022-11-18 MED ORDER — LORAZEPAM 1 MG PO TABS
1.0000 mg | ORAL_TABLET | Freq: Once | ORAL | Status: AC
  Administered 2022-11-18: 1 mg via ORAL
  Filled 2022-11-18: qty 1

## 2022-11-18 MED ORDER — CLONIDINE HCL 0.2 MG PO TABS
0.3000 mg | ORAL_TABLET | Freq: Once | ORAL | Status: AC
  Administered 2022-11-18: 0.3 mg via ORAL
  Filled 2022-11-18: qty 1

## 2022-11-18 NOTE — ED Notes (Signed)
Pt resting in stretcher. Nad.

## 2022-11-18 NOTE — Progress Notes (Signed)
CSW received request from RN to speak with pt. CSW spoke with pt via phone, pt difficult to understand but states he was told by his [employer] case manager to speak with CSW to assist in placing pt in a SNF for STR, CSW advised pt would need medical necessity in order to be placed at SNF, pt became upset and hung up on CSW, RN made aware.

## 2022-11-18 NOTE — ED Triage Notes (Signed)
Patient arrives by POV states he was discharged this morning and no one is taking him seriously. Patient states he was admitted to hospital and left on Friday after having vascular surgery. Was here yesterday and had dialysis. Patient states he usually gets dialysis on [facility] but called this morning and they did not know he was supposed to be there. Patient states he is not healed and should not have been discharged.

## 2022-11-18 NOTE — ED Notes (Signed)
I didn't have any success, getting patient blood.

## 2022-11-18 NOTE — ED Provider Notes (Signed)
Asked by nursing to evaluate the patient as he didn't feel ready to go home.   Rechecked labs and K at 5.2. VSS and WNL. No sob. Resting comfortably. Discussed with patient and he will follow up at his outpatient dialysis center today. No indication for admission at this time.    Daviel Allegretto, Barbara Cower, MD 11/18/22 [phone]

## 2022-11-18 NOTE — ED Provider Notes (Signed)
[place] EMERGENCY DEPARTMENT AT [facility] Provider Note   CSN: [id number] Arrival date & time: 11/18/22  0865     History  Chief Complaint  Patient presents with   Hypertension    [name] is a 37 y.o. male.   Hypertension     37 year old male with history of ESRD on HD TTS, Alport syndrome, HTN, HFmrEF, who was admitted for evaluation of chest pain and hypertension.  Additionally, the patient felt like he was having concerning symptoms yesterday when he presented to the emergency department.  He states that he was breathing fast and was endorsing numbness around his lips and paresthesias in all of his extremities.  Those symptoms have resolved today.  He endorses a feeling that he needs to be back in the hospital and endorses anxiety about his health.  The patient received emergency dialysis yesterday for hyperkalemia.  He was supposed to go to his regular scheduled dialysis appointment today but instead presented to the emergency department for further evaluation.  He believes that his blood pressure medications are causing him to feel fatigued.  He endorses generalized myalgias in his calves occasionally.  He endorses occasional chest pressure.  No ripping or tearing sensation, no radiation to the back.  No fever or chills.  Home Medications Prior to Admission medications   Medication Sig Start Date End Date Taking? Authorizing Provider  acetaminophen (TYLENOL) 325 MG tablet Take 2 tablets (650 mg total) by mouth every 6 (six) hours as needed for mild pain (or Fever >/= 101). 04/14/22   Lockie Mola, MD  amLODipine (NORVASC) 10 MG tablet TAKE 1 TABLET BY MOUTH EVERY DAY 04/10/22   Sabino Dick, DO  calcium acetate (PHOSLO) 667 MG capsule Take 1 capsule (667 mg total) by mouth 3 (three) times daily with meals. 01/04/22   Vonna Drafts, MD  cloNIDine (CATAPRES) 0.3 MG tablet Take 1 tablet (0.3 mg total) by mouth 3 (three) times daily. 11/14/22   Baglia,  Corrina, PA-C  divalproex (DEPAKOTE ER) 500 MG 24 hr tablet Take 1 tablet (500 mg total) by mouth at bedtime. 08/05/22   Sabino Dick, DO  ferric citrate (AURYXIA) 1 GM 210 MG(Fe) tablet Take 210 mg by mouth 3 (three) times daily with meals. 07/26/19   [provider]  hydrALAZINE (APRESOLINE) 100 MG tablet Take 1 tablet (100 mg total) by mouth 3 (three) times daily. 05/20/22   Gaston Islam., NP  labetalol (NORMODYNE) 100 MG tablet Take 1 tablet (100 mg total) by mouth 2 (two) times daily. 11/14/22   Baglia, Corrina, PA-C  losartan (COZAAR) 100 MG tablet Take 1 tablet (100 mg total) by mouth daily. 11/15/22   Baglia, Corrina, PA-C  oxyCODONE-acetaminophen (PERCOCET) 5-325 MG tablet Take 1 tablet by mouth every 6 (six) hours as needed for severe pain. 11/09/22 11/09/23  Baglia, Corrina, PA-C  pantoprazole (PROTONIX) 40 MG tablet TAKE 1 TABLET BY MOUTH EVERY DAY Patient taking differently: Take 40 mg by mouth 2 (two) times daily. 09/17/22   Sabino Dick, DO  sucralfate (CARAFATE) 1 g tablet Take 1 tablet (1 g total) by mouth 2 (two) times daily. 11/14/22   Baglia, Corrina, PA-C  SUMAtriptan (IMITREX) 50 MG tablet Take 1 tablet (50 mg total) by mouth every 2 (two) hours as needed for migraine. Do not take more than 4 tablets in 24 hours. 08/05/22   Sabino Dick, DO  triamcinolone ointment (KENALOG) 0.1 % Apply to scrotum twice daily for itching. Do not use  for more than 2 weeks continuously 05/30/22   Sabino Dick, DO      Allergies    Zestril [lisinopril], Nsaids, Carvedilol, and Risperdal [risperidone]    Review of Systems   Review of Systems  All other systems reviewed and are negative.   Physical Exam Updated Vital Signs BP (!) 167/97   Pulse 97   Temp 98.4 F (36.9 C) (Oral)   Resp 18   Ht 5\' 8"  (1.727 m)   Wt 80.7 kg   SpO2 100%   BMI 27.06 kg/m  Physical Exam Vitals and nursing note reviewed.  Constitutional:      General: He is not in acute  distress.    Appearance: He is well-developed.  HENT:     Head: Normocephalic and atraumatic.  Eyes:     Conjunctiva/sclera: Conjunctivae normal.  Cardiovascular:     Rate and Rhythm: Normal rate and regular rhythm.     Pulses: Normal pulses.     Comments: Right upper extremity AV fistula in place, palpable thrill, audible bruit.  Incision appears to be healing well.  Right chest wall dialysis access port in place Pulmonary:     Effort: Pulmonary effort is normal. No respiratory distress.     Breath sounds: Normal breath sounds.  Abdominal:     Palpations: Abdomen is soft.     Tenderness: There is no abdominal tenderness.  Musculoskeletal:        General: No swelling.     Cervical back: Neck supple.     Right lower leg: No edema.     Left lower leg: No edema.  Skin:    General: Skin is warm and dry.     Capillary Refill: Capillary refill takes less than 2 seconds.  Neurological:     Mental Status: [name].     Comments: MENTAL STATUS EXAM:    Orientation: Alert and oriented to person, place and time.  Memory: Cooperative, follows commands well.  Language: Speech is clear and language is normal.   CRANIAL NERVES:    CN 2 (Optic): Visual fields intact to confrontation.  CN 3,4,6 (EOM): Pupils equal and reactive to light. Full extraocular eye movement without nystagmus.  CN 5 (Trigeminal): Facial sensation is normal, no weakness of masticatory muscles.  CN 7 (Facial): No facial weakness or asymmetry.  CN 8 (Auditory): Auditory acuity grossly normal.  CN 9,10 (Glossophar): The uvula is midline, the palate elevates symmetrically.  CN 11 (spinal access): Normal sternocleidomastoid and trapezius strength.  CN 12 (Hypoglossal): The tongue is midline. No atrophy or fasciculations.Marland Kitchen   MOTOR:  Muscle Strength: 5/5RUE, 5/5LUE, 5/5RLE, 5/5LLE.   COORDINATION:   Intact finger-to-nose, no tremor.   SENSATION:   Intact to light touch all four extremities.     Psychiatric:         Mood and Affect: Mood is anxious.     ED Results / Procedures / Treatments   Labs (all labs ordered are listed, but only abnormal results are displayed) Labs Reviewed  CBC - Abnormal; Notable for the following components:      Result Value   RBC 2.66 (*)    Hemoglobin 8.3 (*)    HCT 24.9 (*)    All other components within normal limits  BASIC METABOLIC PANEL - Abnormal; Notable for the following components:   Sodium 131 (*)    Chloride 88 (*)    Glucose, Bld 131 (*)    BUN 66 (*)    Creatinine, Ser 12.01 (*)  GFR, Estimated 5 (*)    Anion gap 17 (*)    All other components within normal limits  TROPONIN I (HIGH SENSITIVITY) - Abnormal; Notable for the following components:   Troponin I (High Sensitivity) 25 (*)    All other components within normal limits  MAGNESIUM    EKG EKG Interpretation Date/Time:  Tuesday November 18 2022 11:48:00 EDT Ventricular Rate:  86 PR Interval:  156 QRS Duration:  102 QT Interval:  380 QTC Calculation: 454 R Axis:   28  Text Interpretation: Normal sinus rhythm Confirmed by Ernie Avena ([id number]) on 11/18/2022 12:28:43 PM  Radiology DG Chest 1 View  Result Date: 11/18/2022 CLINICAL DATA:  [id number] Chest pain [id number] EXAM: CHEST  1 VIEW COMPARISON:  CXR 11/17/22 FINDINGS: Right-sided central venous catheter in place with tip near cavoatrial junction. Cardiomegaly. No pleural effusion. No pneumothorax. No focal airspace opacity. No radiographically apparent displaced rib fractures. Visualized upper abdomen is unremarkable. IMPRESSION: Cardiomegaly.  No focal airspace opacity. Electronically Signed   By: Lorenza Cambridge M.D.   On: 11/18/2022 12:27   DG Chest Port 1 View  Result Date: 11/17/2022 CLINICAL DATA:  Shortness of breath and left arm swelling EXAM: PORTABLE CHEST 1 VIEW COMPARISON:  Multiple chest x-rays, most recently November 10, 2022 FINDINGS: Stable positioning of right tunneled central venous catheter, terminating in the right atrium. Unchanged  cardiomegaly and mediastinal contours. No focal pulmonary opacity. No pleural effusion or pneumothorax. Partially visualized enteric contrast within loops of bowel in the left upper quadrant. No acute osseous abnormality. IMPRESSION: 1. No acute pulmonary abnormality. 2. Unchanged cardiomegaly. Electronically Signed   By: Jacob Moores M.D.   On: 11/17/2022 07:57    Procedures Procedures    Medications Ordered in ED Medications  LORazepam (ATIVAN) tablet 1 mg (1 mg Oral Given 11/18/22 1120)  cloNIDine (CATAPRES) tablet 0.3 mg (0.3 mg Oral Given 11/18/22 1300)  hydrALAZINE (APRESOLINE) tablet 100 mg (100 mg Oral Given 11/18/22 1301)  labetalol (NORMODYNE) tablet 100 mg (100 mg Oral Given 11/18/22 1301)    ED Course/ Medical Decision Making/ A&P                             Medical Decision Making Amount and/or Complexity of Data Reviewed Labs: ordered. Radiology: ordered.  Risk Prescription drug management.   37 year old male with history of ESRD on HD TTS, Alport syndrome, HTN, HFmrEF, who was admitted for evaluation of chest pain and hypertension.  Additionally, the patient felt like he was having concerning symptoms yesterday when he presented to the emergency department.  He states that he was breathing fast and was endorsing numbness around his lips and paresthesias in all of his extremities.  Those symptoms have resolved today.  He endorses a feeling that he needs to be back in the hospital and endorses anxiety about his health.  The patient received emergency dialysis yesterday for hyperkalemia.  He was supposed to go to his regular scheduled dialysis appointment today but instead presented to the emergency department for further evaluation.  He believes that his blood pressure medications are causing him to feel fatigued.  He endorses generalized myalgias in his calves occasionally.  He endorses occasional chest pressure.  No ripping or tearing sensation, no radiation to the back.  No  fever or chills.  On arrival, the patient was afebrile, not tachycardic or tachypneic, hypertensive BP 203/114, saturating 90% on room air.  Patient presenting with anxiety about  health and various vague and nonspecific complaints.  Endorses muscle cramping occasionally in his calves.  Had electrolyte abnormalities yesterday prior to dialysis.  Presented hyperventilating yesterday per review of notes and had a respiratory alkalosis likely from hyperventilation which also likely caused his paresthesias.  Workup initiated given the patient's intermittent chest discomfort over the past few days.  EKG was performed which revealed sinus rhythm, ventricular rate 86, no acute ischemic changes.  Chest x-ray was performed which revealed no evidence of cardiac or pulmonary abnormality, no pulmonary edema, unchanged cardiomegaly was present.  Regarding the patient's hypertension, he is fairly asymptomatic but has an elevated troponin with a troponin of 25.  This is likely in the setting of the patient's known ESRD.  His baseline troponin appears to be chronically elevated.  Low concern for hypertensive emergency.  His home antihypertensives were administered with appropriate improvement in his blood pressure downtrending to 167/97.  He has no blurry vision, is neurologically intact.  He has no evidence of pulmonary edema.  He is currently asymptomatic without chest discomfort at rest.  Low concern for ACS at this time.  Laboratory evaluation additionally revealed magnesium normal at 2.1, CBC without a leukocytosis, stable hemoglobin at 8.3 compared to the patient's baseline around 7-8.  BMP without need for emergent dialysis with a potassium of 4.8, mildly elevated BUN to 66, creatinine of 12.01.  Mildly elevated anion gap to 17 likely in the setting of uremia.  The patient is not confused.  On repeat assessment, the patient was resting comfortably in bed.  Do not think that emergent dialysis is indicated at this time.   Advised that the patient follow-up outpatient for continued outpatient dialysis, return precautions were provided.  Advised that he also follow-up outpatient in the primary care setting to continue to discuss his blood pressure regimen.  Final Clinical Impression(s) / ED Diagnoses Final diagnoses:  Hypertension, unspecified type  Anxiety about health  Paresthesias  ESRD (end stage renal disease) on dialysis [facility])    Rx / DC Orders ED Discharge Orders     None         Ernie Avena, MD 11/18/22 2039

## 2022-11-18 NOTE — Discharge Instructions (Addendum)
There is no indication for emergent dialysis today.  Please continue to aggressively manage your high blood pressure with your home medications.  Follow-up with your primary care physician and your nephrologist regarding your hypertension.  You can also benefit from additional fluid removal during dialysis.  Follow-up with your your regularly scheduled dialysis appointment on Thursday.  Return for any severe worsening of symptoms.

## 2022-11-18 NOTE — ED Notes (Signed)
Pt resting in stretcher. Nad.  

## 2022-11-18 NOTE — ED Notes (Signed)
Pt verbalized understanding of discharge instructions. Opportunity for questions provided.   Pt upset about being discharged.  Pt felt he was supposed to get dialysis this morning.  Per EDP note, Pt is supposed to follow-up w/ Outpatient dialysis.  Pt sts "I need all of my records.  I'm never coming back here again." Pt informed records must be obtained from Medical Records.  Pt offered a bus pass and a drink.  Pt sts "ma'am, don't you worry about it."

## 2022-11-18 NOTE — Progress Notes (Deleted)
    SUBJECTIVE:   CHIEF COMPLAINT / HPI:   ***  Recently admitted for fistula revision by [facility] and also trested for uncontrolled BP.    PERTINENT  PMH / PSH: ***  OBJECTIVE:   There were no vitals taken for this visit.  ***  ASSESSMENT/PLAN:   No problem-specific Assessment & Plan notes found for this encounter.   Suspect pt chronically severely uncontrolled HTN causing him to be symptomatic with decreases in BP (despite still being above goal).     MAYBE: Discuss w/ pt to slowly titrate BP meds  Lincoln Brigham, MD [facility]

## 2022-11-18 NOTE — ED Notes (Signed)
Pt was stable upon discharge. Pt was provided discharge paperwork before being escorted out by security.

## 2022-11-18 NOTE — ED Notes (Signed)
Pt has been up for discharge and pt has refused to leave the bathroom for extended period of time. Security has been called to exscort the pt out of the hospital at this time.

## 2022-11-19 ENCOUNTER — Ambulatory Visit: Payer: Self-pay | Admitting: Licensed Clinical Social Worker

## 2022-11-19 ENCOUNTER — Telehealth: Payer: Self-pay | Admitting: *Deleted

## 2022-11-19 ENCOUNTER — Inpatient Hospital Stay: Payer: Self-pay | Admitting: Family Medicine

## 2022-11-19 NOTE — Patient Instructions (Signed)
Visit Information  Thank you for taking time to visit with me today. Please don't hesitate to contact me if I can be of assistance to you.   Following are the goals we discussed today:   Goals Addressed             This Visit's Progress    Patient Stated he had recent arm surgery. he spoke of pain issues in his arm       Interventions  Spoke with client about client needs Discussed program support with client Client and LCSW spoke of client concerns over recent care received by client at hospital.  Client plans to call Hospital to talk with Patient Care and discuss care issues of his recent stays at hospital. Client said a person at hospital visited him while he was at hospital to try to talk with him about care concerns Discussed pain issues of client. Discussed recent arm surgery of client Provided counseling support for client Used Active Listening Techniques  with client to hear client concerns at this time. Client had many concerns about recent hospital care received by client Spoke of job loss of client. Client spoke of dialysis treatments received. He is trying to get on kidney transport list He has car but it needs repairs.  Car is not working at present. Client spoke of financial issues. He gets Education officer, environmental. Client and wife are separated. Client has financial challenges.  Spoke of food needs. He has family that helps him occasionally with food needs. 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 He said he has adequate food.  Discussed support of program staff.  Discussed with client that RN was scheduled to call client in a few weeks to discuss nursing needs of client.              Our next appointment is by telephone on 12/10/22 at 10:00 AM   Please call the care guide team at [phone] if you need to cancel or reschedule your appointment.   If you are experiencing a Mental  Health or Behavioral Health Crisis or need someone to talk to, please go to [facility] [address], [place] [phone])   The patient verbalized understanding of instructions, educational materials, and care plan provided today and DECLINED offer to receive copy of patient instructions, educational materials, and care plan.   The patient has been provided with contact information for the care management team and has been advised to call with any health related questions or concerns.   Kelton Pillar.Tishana Clinkenbeard MSW, LCSW Licensed Visual merchandiser [facility] Care Management [phone]

## 2022-11-19 NOTE — Progress Notes (Signed)
  Care Coordination   Note   11/19/2022 Name: RAYNOLD BLANKENBAKER MRN: [id number] DOB: 09/11/1985  [name] is a 37 y.o. year old male who sees [name], DO for primary care. I reached out to [employer] by phone today to offer care coordination services.  Mr. Stratmann was given information about Care Coordination services today including:   The Care Coordination services include support from the care team which includes your Nurse Coordinator, Clinical Social Worker, or Pharmacist.  The Care Coordination team is here to help remove barriers to the health concerns and goals most important to you. Care Coordination services are voluntary, and the patient may decline or stop services at any time by request to their care team member.   Care Coordination Consent Status: Patient agreed to services and verbal consent obtained.   Follow up plan:  Telephone appointment with care coordination team member scheduled for:  7/10 and 7/16  Encounter Outcome:  Pt. Scheduled [facility] Coordination Care Guide  Direct Dial: [phone]

## 2022-11-19 NOTE — Patient Outreach (Signed)
Care Coordination   Initial Visit Note   11/19/2022 Name: LOPAKA KARGE MRN: [id number] DOB: May 01, 1986  [name] is a 37 y.o. year old male who sees [name], DO for primary care. I spoke with  Germaine Pomfret Ovando by phone today.  What matters to the patients health and wellness today? Client had recent arm surgery. He spoke of pain issues in his arm.     Goals Addressed             This Visit's Progress    Patient Stated he had recent arm surgery. he spoke of pain issues in his arm       Interventions  Spoke with client about client needs Discussed program support with client Client and LCSW spoke of client concerns over recent care received by client at hospital.  Client plans to call Hospital to talk with Patient Care and discuss care issues of his recent stays at hospital. Client said a person at hospital visited him while he was at hospital to try to talk with him about care concerns Discussed pain issues of client. Discussed recent arm surgery of client Provided counseling support for client Used Active Listening Techniques  with client to hear client concerns at this time. Client had many concerns about recent hospital care received by client Spoke of job loss of client. Client spoke of dialysis treatments received. He is trying to get on kidney transport list He has car but it needs repairs.  Car is not working at present. Client spoke of financial issues. He gets Education officer, environmental. Client and wife are separated. Client has financial challenges.  Spoke of food needs. He has family that helps him occasionally with food needs. 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 He said he has adequate food.  Discussed support of program staff.  Discussed with client that RN was scheduled to call client in a few weeks to discuss nursing needs of client.              SDOH assessments  and interventions completed:  Yes  SDOH Interventions Today    Flowsheet Row Most Recent Value  SDOH Interventions   Depression Interventions/Treatment  Counseling  Physical Activity Interventions Other (Comments)  [mobility challenges,  arm cramping]  Stress Interventions Provide Counseling  [has stress related to managing medical needs]        Care Coordination Interventions:  Yes, provided   Interventions Today    Flowsheet Row Most Recent Value  Chronic Disease   Chronic disease during today's visit Other  [spoke with client about client needs]  General Interventions   General Interventions Discussed/Reviewed General Interventions Discussed, [employer]  [discussed program support]  Exercise Interventions   Exercise Discussed/Reviewed Physical Activity  [discussed arm pain issues]  Physical Activity Discussed/Reviewed Physical Activity Discussed  Education Interventions   Education Provided Provided Education  Provided Verbal Education On Community Resources  Mental Health Interventions   Mental Health Discussed/Reviewed Coping Strategies  [client was upset about recent care issues at hospital. He talked about care received at hospital by client recenlty]  Pharmacy Interventions   Pharmacy Dicussed/Reviewed Pharmacy Topics Discussed        Follow up plan: Follow up call scheduled for 12/10/22 at 10:00 AM    Encounter Outcome:  Pt. Visit Completed   Kelton Pillar.Yuji Walth MSW, LCSW Licensed Visual merchandiser [facility] Care Management [phone]

## 2022-11-20 ENCOUNTER — Ambulatory Visit (INDEPENDENT_AMBULATORY_CARE_PROVIDER_SITE_OTHER): Payer: Medicare Other | Admitting: Family Medicine

## 2022-11-20 ENCOUNTER — Encounter: Payer: Self-pay | Admitting: Family Medicine

## 2022-11-20 ENCOUNTER — Other Ambulatory Visit: Payer: Self-pay

## 2022-11-20 VITALS — BP 197/119 | HR 108 | Ht 68.0 in | Wt 172.0 lb

## 2022-11-20 DIAGNOSIS — E162 Hypoglycemia, unspecified: Secondary | ICD-10-CM | POA: Diagnosis not present

## 2022-11-20 DIAGNOSIS — R531 Weakness: Secondary | ICD-10-CM

## 2022-11-20 DIAGNOSIS — I159 Secondary hypertension, unspecified: Secondary | ICD-10-CM

## 2022-11-20 LAB — GLUCOSE, POCT (MANUAL RESULT ENTRY): POC Glucose: 115 mg/dl — AB (ref 70–99)

## 2022-11-20 NOTE — Progress Notes (Signed)
    SUBJECTIVE:   CHIEF COMPLAINT / HPI:   Hypertension -Recent ED visit for chest pain and HTN, had emergency dialysis on 7/8 -Had chest pain at dialysis earlier today, not having any now -was having a headache but took Excedrin and is feeling better now -took one of his blood pressure medications today about an hour ago- hydralazine 100mg  -thinks he stuttering a lot and feels shaky -has eaten today, most recently an hour ago -has a strange taste in his mouth when he wakes up -thinks the clonidine and labetalol are making him feel strange, says he took only clonidine yesterday and felt very weak after -takes amlodipine at night   PERTINENT  PMH / PSH: reviewed  OBJECTIVE:   BP (!) 197/119   Pulse (!) 108   Ht 5\' 8"  (1.727 m)   Wt 172 lb (78 kg)   SpO2 97%   BMI 26.15 kg/m   POC glucose 115  Physical Exam Constitutional:      Appearance: Normal appearance.  Eyes:     Pupils: Pupils are equal, round, and reactive to light.  Cardiovascular:     Rate and Rhythm: Tachycardia present.  Pulmonary:     Effort: Pulmonary effort is normal.  Neurological:     General: No focal deficit present.     Mental Status: [name].     ASSESSMENT/PLAN:   Hypertension -took 100mg  losartan in the room -pt with chronically elevated blood pressures and inconsistent medication use -reporting intolerance to clonidine and labetalol, suspect drastic drop in blood pressure causing patient to feel weak -patient agreed to continue taking 3 of his blood pressure medications (losartan, hydralazine, and amlodipine) and meet with Dr Raymondo Band in 2 weeks to discuss changes in medication regimen   Patient Instructions  Thank you for coming in today!  Things we discussed today: For your blood pressure, please continue taking losartan, hydralazine, and amlodipine 2. I scheduled you for an appointment with our pharmacist Dr Raymondo Band for July 24th at 9:15 AM to discuss your blood pressure  medications 3. If you start to have chest pain, shortness of breath, blurred vision, or bad headache, please go to the emergency room.  Have a great day!  Lorayne Bender, MD [facility]

## 2022-11-20 NOTE — Discharge Summary (Signed)
Discharge Summary  Patient ID: [name] [id number] 37 y.o. 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 male with a large aneurysmal right upper arm fistula.  He was reluctant to place new access and wanted to try to revise this.   Hospital Course: [name] was admitted jon 11/07/22 and underwent Ultrasound-guided placement of right IJ tunneled dialysis catheter (19 cm) Excision of large aneurysmal right upper arm fistula with revision using 7 mm PTFE graft by Dr. Edilia Bo. He remained stable post operatively and was taken to the recovery room in stable condition.   In the recovery room he was complaining of some shortness of breath. Nephrology was consulted for evaluation and hemodialysis was recommended to help with volume overload. Due to dialysis being late in the day post operatively as well as pain control patient was admitted for overnight observation.   Patient was transferred to the floor. He became very agitated and anxious. Pain level was very elevated as well as his blood pressure. Pain medication was administered but patient was refusing further assessment as well as telemetry. He was later complaining of chest pain. EKG performed which was normal. Continued to have uncontrolled pain and cramping. He remained very agitated and had an altercation with on of the nurses caring  for her. Hospital Security was called. They were able to calm patient down.   POD#1, continued pain in right arm. Also having significant headache. Has history of migraines. Arm remained well perfused and warm. Dressings kept in place. Blood pressure remained very elevated. Home medications resumed as well as PRN medications continued. Was stable for discharge home however patient felt that he could not leave with the pain he was having. Hospitalists medicine was consulted for medical management. Some adjustments were made to patients antihypertensive regimen.  Underwent inpatient HD per his usual Tuesday, Thursday, Saturday outpatient schedule. Chest x ray was obtained showing some edema. He otherwise remained hemodynamically stable  POD#2, Headache much improved. Right arm pain also much improved however patient feeling he still wanted more time in the hospital for control of his pain and blood pressure. Due to longer admission Nephrology was officially consulted for inpatient hemodialysis.   POD#3, patient having increased complaints of generalized weakness, tingling of his extremities, feeling like his chest was "pounding out of my chest", and low blood sugars. He was found to be hypoglycemic but this improved quickly with apple juice. He remained hemodynamically stable. No abnormal rhythms on telemetry. Oxygen saturation normal. Patient very anxious . Also complaining of food getting "stuck" when trying to swallow. Swallow evaluation ordered. SLP recommended barium esophagram. Order was placed for this.   POD#4, esophageal stricture noted on barium swallow. GI consulted. Otherwise Hospitalists continued to assist with medical management of his hypertension. Also complaining of new cough and nausea/ vomiting. Improved with PRN medications. His right arm remained well perfused and incisions intact. He underwent inpatient HD per his usual schedule. Continued to endorse symptoms of hypoglycemia overnight  despite normal blood  sugars. Also complaining of continued shortness of breath and palpitations. Family asking for possible discharge to nursing facility. TOC RN talked with family regarding their wishes however expressed that insurance would  not cover rehab for management of blood sugar and blood pressure. PT/ OT eval was recommended for their input on possible placement.   POD#5, he was evaluated by GI following his swallow study and barium examination with recommendation for upper endoscopy. He underwent EGD which found a stricture. This was treated with dilatation. Also noted findings included gastritis and duodenitis and small hiatal hernia. Several biopsies were taken. Cleared to resume normal diet with twice daily PPI and Carafate for 2-4 weeks. Outpatient follow up arranged. Post procedure patient was finally evaluated by PT. No acute PT needs, did recommend Home PT. OT felt he would benefit from continued acute therapy while in hospital as well as Home health OT. Case management discussed with patient and family that he would not be candidate for skilled nursing facility based on their assessment and recommendations.  POD#6, overnight patient again became very aggressive with nursing staff and security had to be called. Psychiatry consult requested. Psychiatry evaluated him and diagnosed him with Adjustment disorder with mixed disturbance of emotions and conduct, chronic; Prolonged Grief disorder and Cannabis use disorder, moderate, in withdrawal. Their recommendations were to continue his Depakote 500 mg ER at bedtime and marijuana cessation. They felt no need for acute inpatient psychiatric care and felt he does not meet criteria for involuntary commitment as he is not a harm to himself or others. He underwent  Another Inpatient HD treatment. Otherwise continued regular medical management of his medical co morbidities  POD#7, he remained stable for discharge home from vascular, medical and  nephrology discharge. He was reporting concern about his blood sugars dropping low and " feeling funny". He was educated on successful management of his blood pressure and blood sugar. He was reassured that his blood sugars had been within the normal range since admission. He has follow up arranged with [facility] to follow up regarding his medical issues. TOC RN provided patient with information regarding discharge to [facility]) if needed as he currently does not have a home. Provided also with shelter list. His outpatient hemodialysis center was contacted to notify of his discharge so that he can resume his outpatient schedule. He will have follow up arranged in 2-3 weeks in our office for incision check.   Consults: cardiology, GI, nephrology, psychiatry, and Family Medicine  Treatments: antibiotics: Ancef, analgesia: acetaminophen, Morphine, and Oxycodone-Acetaminophen, therapies: PT, OT, RN, and SW, dialysis: Hemodialysis, and surgery:   Ultrasound-guided placement of right IJ tunneled dialysis catheter (19 cm) Excision of large aneurysmal right upper arm fistula with revision using 7 mm PTFE graft;  Upper GI endoscopy    Disposition: Discharge disposition: 01-Home or Self Care       Patient Instructions:  Allergies as of 11/14/2022       Reactions   Zestril [lisinopril] Swelling   Nsaids Other (See Comments)   "Kidney problems "   Carvedilol Other (See Comments)   Makes mouth numb   Risperdal [risperidone] Other (See Comments)   Unknown reaction         Medication List     TAKE these medications    acetaminophen 325 MG tablet Commonly known as: TYLENOL Take 2 tablets (650 mg total) by mouth every 6 (six) hours as needed for mild pain (or Fever >/= 101).   amLODipine 10  MG tablet Commonly known as: NORVASC TAKE 1 TABLET BY MOUTH EVERY DAY   Auryxia 1 GM 210 MG(Fe) tablet Generic drug: ferric citrate Take 210 mg by mouth 3 (three)  times daily with meals.   calcium acetate 667 MG capsule Commonly known as: PHOSLO Take 1 capsule (667 mg total) by mouth 3 (three) times daily with meals.   cloNIDine 0.3 MG tablet Commonly known as: CATAPRES Take 1 tablet (0.3 mg total) by mouth 3 (three) times daily. What changed:  medication strength how much to take when to take this   divalproex 500 MG 24 hr tablet Commonly known as: DEPAKOTE ER Take 1 tablet (500 mg total) by mouth at bedtime.   hydrALAZINE 100 MG tablet Commonly known as: APRESOLINE Take 1 tablet (100 mg total) by mouth 3 (three) times daily.   labetalol 100 MG tablet Commonly known as: NORMODYNE Take 1 tablet (100 mg total) by mouth 2 (two) times daily.   losartan 100 MG tablet Commonly known as: COZAAR Take 1 tablet (100 mg total) by mouth daily. What changed:  medication strength how much to take   oxyCODONE-acetaminophen 5-325 MG tablet Commonly known as: Percocet Take 1 tablet by mouth every 6 (six) hours as needed for severe pain.   pantoprazole 40 MG tablet Commonly known as: PROTONIX TAKE 1 TABLET BY MOUTH EVERY DAY What changed: when to take this   sucralfate 1 g tablet Commonly known as: CARAFATE Take 1 tablet (1 g total) by mouth 2 (two) times daily.   SUMAtriptan 50 MG tablet Commonly known as: IMITREX Take 1 tablet (50 mg total) by mouth every 2 (two) hours as needed for migraine. Do not take more than 4 tablets in 24 hours.   triamcinolone ointment 0.1 % Commonly known as: KENALOG Apply to scrotum twice daily for itching. Do not use for more than 2 weeks continuously               Discharge Care Instructions  (From admission, onward)           Start     Ordered   11/09/22 0000  Discharge wound care:       Comments: Do not shower with dialysis catheter in your chest. You can wash over right upper arm incisions with mild soap and water, pat dry. Do not soak arm in bathbub   11/09/22 0854            Activity: activity as tolerated, no driving while on analgesics, and no heavy lifting for 4-6 weeks Diet: renal diet Wound Care:  wash incision with mild soap and water, pat dry. Do not soak in bathtub  Follow-up with [facility] in  2-3  weeks.  SignedGraceann Congress, PA-C 11/20/2022 10:13 AM [facility] Office: [phone]

## 2022-11-20 NOTE — Patient Instructions (Signed)
Thank you for coming in today!  Things we discussed today: For your blood pressure, please continue taking losartan, hydralazine, and amlodipine 2. I scheduled you for an appointment with our pharmacist Dr Raymondo Band for July 24th at 9:15 AM to discuss your blood pressure medications 3. If you start to have chest pain, shortness of breath, blurred vision, or bad headache, please go to the emergency room.  Have a great day!

## 2022-11-24 ENCOUNTER — Telehealth: Payer: Self-pay

## 2022-11-24 ENCOUNTER — Emergency Department (HOSPITAL_COMMUNITY): Payer: Medicare Other

## 2022-11-24 ENCOUNTER — Emergency Department (HOSPITAL_COMMUNITY)
Admission: EM | Admit: 2022-11-24 | Discharge: 2022-11-24 | Disposition: A | Discharge status: Home / Self Care | Payer: Medicare Other | Attending: Emergency Medicine | Admitting: Emergency Medicine

## 2022-11-24 ENCOUNTER — Other Ambulatory Visit: Payer: Self-pay

## 2022-11-24 ENCOUNTER — Encounter (HOSPITAL_COMMUNITY): Payer: Self-pay | Admitting: Emergency Medicine

## 2022-11-24 DIAGNOSIS — M7989 Other specified soft tissue disorders: Secondary | ICD-10-CM | POA: Insufficient documentation

## 2022-11-24 DIAGNOSIS — I120 Hypertensive chronic kidney disease with stage 5 chronic kidney disease or end stage renal disease: Secondary | ICD-10-CM | POA: Insufficient documentation

## 2022-11-24 DIAGNOSIS — R000 Tachycardia, unspecified: Secondary | ICD-10-CM | POA: Insufficient documentation

## 2022-11-24 DIAGNOSIS — N186 End stage renal disease: Secondary | ICD-10-CM | POA: Diagnosis not present

## 2022-11-24 DIAGNOSIS — Z992 Dependence on renal dialysis: Secondary | ICD-10-CM | POA: Diagnosis not present

## 2022-11-24 DIAGNOSIS — R519 Headache, unspecified: Secondary | ICD-10-CM | POA: Insufficient documentation

## 2022-11-24 DIAGNOSIS — M791 Myalgia, unspecified site: Secondary | ICD-10-CM | POA: Insufficient documentation

## 2022-11-24 DIAGNOSIS — Z79899 Other long term (current) drug therapy: Secondary | ICD-10-CM | POA: Insufficient documentation

## 2022-11-24 LAB — COMPREHENSIVE METABOLIC PANEL
ALT: 20 U/L (ref 0–44)
AST: 27 U/L (ref 15–41)
Albumin: 3.2 g/dL — ABNORMAL LOW (ref 3.5–5.0)
Alkaline Phosphatase: 78 U/L (ref 38–126)
Anion gap: 20 — ABNORMAL HIGH (ref 5–15)
BUN: 69 mg/dL — ABNORMAL HIGH (ref 6–20)
CO2: 24 mmol/L (ref 22–32)
Calcium: 9 mg/dL (ref 8.9–10.3)
Chloride: 95 mmol/L — ABNORMAL LOW (ref 98–111)
Creatinine, Ser: 13.77 mg/dL — ABNORMAL HIGH (ref 0.61–1.24)
GFR, Estimated: 4 mL/min — ABNORMAL LOW (ref >60)
Glucose, Bld: 107 mg/dL — ABNORMAL HIGH (ref 70–99)
Potassium: 4.4 mmol/L (ref 3.5–5.1)
Sodium: 139 mmol/L (ref 135–145)
Total Bilirubin: 0.6 mg/dL (ref 0.3–1.2)
Total Protein: 5.9 g/dL — ABNORMAL LOW (ref 6.5–8.1)

## 2022-11-24 LAB — CBC WITH DIFFERENTIAL/PLATELET
Abs Immature Granulocytes: 0.03 10*3/uL (ref 0.00–0.07)
Basophils Absolute: 0 10*3/uL (ref 0.0–0.1)
Basophils Relative: 1 %
Eosinophils Absolute: 0.3 10*3/uL (ref 0.0–0.5)
Eosinophils Relative: 3 %
HCT: 23.8 % — ABNORMAL LOW (ref 39.0–52.0)
Hemoglobin: 7.6 g/dL — ABNORMAL LOW (ref 13.0–17.0)
Immature Granulocytes: 0 %
Lymphocytes Relative: 13 %
Lymphs Abs: 1 10*3/uL (ref 0.7–4.0)
MCH: 30.5 pg (ref 26.0–34.0)
MCHC: 31.9 g/dL (ref 30.0–36.0)
MCV: 95.6 fL (ref 80.0–100.0)
Monocytes Absolute: 0.5 10*3/uL (ref 0.1–1.0)
Monocytes Relative: 7 %
Neutro Abs: 6.2 10*3/uL (ref 1.7–7.7)
Neutrophils Relative %: 76 %
Platelets: 252 10*3/uL (ref 150–400)
RBC: 2.49 MIL/uL — ABNORMAL LOW (ref 4.22–5.81)
RDW: 14.6 % (ref 11.5–15.5)
WBC: 8.1 10*3/uL (ref 4.0–10.5)
nRBC: 0 % (ref 0.0–0.2)

## 2022-11-24 LAB — LACTIC ACID, PLASMA: Lactic Acid, Venous: 1.2 mmol/L (ref 0.5–1.9)

## 2022-11-24 LAB — CK: Total CK: 414 U/L — ABNORMAL HIGH (ref 49–397)

## 2022-11-24 MED ORDER — HYDROMORPHONE HCL 1 MG/ML IJ SOLN
1.0000 mg | Freq: Once | INTRAMUSCULAR | Status: AC
  Administered 2022-11-24: 1 mg via INTRAVENOUS
  Filled 2022-11-24 (×2): qty 1

## 2022-11-24 MED ORDER — HYDRALAZINE HCL 50 MG PO TABS
100.0000 mg | ORAL_TABLET | Freq: Three times a day (TID) | ORAL | Status: DC
  Administered 2022-11-24: 100 mg via ORAL
  Filled 2022-11-24: qty 2

## 2022-11-24 MED ORDER — CLONIDINE HCL 0.2 MG PO TABS
0.2000 mg | ORAL_TABLET | Freq: Once | ORAL | Status: AC
  Administered 2022-11-24: 0.2 mg via ORAL
  Filled 2022-11-24: qty 1

## 2022-11-24 MED ORDER — OXYCODONE-ACETAMINOPHEN 5-325 MG PO TABS: 1.0000 | ORAL_TABLET | Freq: Three times a day (TID) | ORAL | 0 refills | Status: AC | PRN

## 2022-11-24 NOTE — Consult Note (Addendum)
     HPI: Reason for Consult: edema at incision site post plication of fistula Referring Physician: ED  History of Present Illness: 36 y/o male with ESRD s/p plication of 2 large aneurysms 11/07/22.[facility].  Last HD was Saturday.   He states his whole right side of his body is in pain and he went by the [facility] for advice.  He was told to go to the ED.  He has history of mental illness and is not very clear on the area of pain.        Objective (!) 194/126 96 98.2 F (36.8 C) (Oral) 11 98% No intake or output data in the 24 hours ending 11/24/22 1351  Palpable radial pulse and palpable excellent thrill in the fistula above the graft area of the hybrid access No signs of infection.  No erythema, no open wounds, edema surrounding the incisions.  There is no edema in the upper or lower arm surrounding the incisions. Motor intact right UE Lungs non labored breathing      Assessment/Planning: ESRD S/P  plication of 2 large aneurysms 11/07/22 with interposition PTFE graft Dr. Randie Heinz saw the patient as well in the ED and states the arm has no new appearance than prior to discharge.  No signs of infection.  No leukocytosis or fever.    He states" he is on a transplant list and will get a new Kidney soon anyway."    No intervention is emergent.  His options are to let if heal or have the fistula removed.  We tried to encourage healing due to the extent of surgery required to take it out.    Plan will be for discharge with additional pain medication and keep f/u in our office with Dr. Edilia Bo.  Mosetta Pigeon 11/24/2022 1:51 PM --  Laboratory Lab Results: Recent Labs    11/24/22 1109  WBC 8.1  HGB 7.6*  HCT 23.8*  PLT 252   BMET Recent Labs    11/24/22 1109  NA 139  K 4.4  CL 95*  CO2 24  GLUCOSE 107*  BUN 69*  CREATININE 13.77*  CALCIUM 9.0    COAG No results found for: "INR", "PROTIME" No results found for: "PTT"     I have interviewed and  examined patient with PA and agree with assessment and plan above.  His arm really does not look appreciably different from the time of his discharge at my last examination.  He does have pain in the axilla but the arm really looks perfectly normal at that location.  There is a very strong thrill in the fistula.  He has a follow-up with Dr. Edilia Bo and he can keep this in our office.  I discussed with the patient that his only other options would be graft excision which is unlikely to fix the pain acutely.  I am unsure of his level of understanding but certainly may ultimately require graft excision though I do not think currently it is infected as he has no leukocytosis or fevers.  Nataliyah Packham C. Randie Heinz, MD [facility] Office: [phone] Pager: [phone]

## 2022-11-24 NOTE — ED Notes (Signed)
Pt called out stating he just spoke to his dialysis center and sts "ya'll aren't doing what you're supposed to do."  Sts he wants the Vascular team back in his room, so he can "tell them what they need to be doing."

## 2022-11-24 NOTE — Discharge Instructions (Addendum)
I emailed Dr. Adele Dan office.  You should still call the number below soon as possible to arrange a close follow-up appointment to discuss options of fistula repair versus replacement.  A prescription for Percocet was sent to your pharmacy to help with the pain.  Take only as needed.  Your blood pressure today was high.  Continue home blood pressure medications.  Continue follow-up with your primary care doctor for ongoing management of blood pressure.  Return to the emergency department for any new or worsening symptoms of concern.

## 2022-11-24 NOTE — ED Notes (Signed)
Pt verbalized understanding of discharge instructions and wheeled out by ED staff.

## 2022-11-24 NOTE — ED Notes (Signed)
Patient transported to CT 

## 2022-11-24 NOTE — Telephone Encounter (Signed)
Call returned to [name], who states she is pt's mother in law. She is calling to get more information about his ED visit and the plan and schedule an appt. I have advised her that once they d/c him, they will let us know when to schedule him for a f/u appt and that our office will reach out to him to schedule this. She verbalized understanding of the above.

## 2022-11-24 NOTE — ED Notes (Signed)
Pt complaining of being short of breathe and requesting oxygen. SPO2 95%. Placed pt on 2l of  O2 for comfort per the nurse

## 2022-11-24 NOTE — Telephone Encounter (Signed)
Caller: Arty Baumgartner & Gregary Signs, RN at [facility]  Concern: Pt came into HD center on off day, Dr. Ernest Pine assessed and is concerned for hematoma. Pt has significant swelling, warmth, hard knot at access, and 6/10 pain.  Location: right arm  Description:  Unsure of start of symptoms  Procedure: Dialysis Access Surgery  Consulted: Matt, PA  Resolution: Appointment scheduled for opening today to ensure no surgical intervention is needed  Next Appt: Appointment scheduled for 7/15 @ 1315

## 2022-11-24 NOTE — ED Provider Notes (Signed)
[place] EMERGENCY DEPARTMENT AT [facility] Provider Note   CSN: [id number] Arrival date & time: 11/24/22  1100     History  Chief Complaint  Patient presents with   Generalized Body Aches   Hypertension   Headache    [name] is a 37 y.o. male.   Hypertension Associated symptoms include headaches.  Headache Associated symptoms: myalgias   Patient presents for fistula concern.  Medical history includes ESRD, bipolar disorder, depression, GERD, HTN, anemia.  He undergoes dialysis on T, TH, SA.  He underwent most recent session on Saturday.  He has a right upper arm fistula that was placed 3 years ago.  He recently underwent fistulogram for large aneurysm.  On 6/28 he underwent revision with graft placement.  He has since been using a right chest catheter.  He went to his dialysis center today for assessment of his fistula.  Nephrologist, Dr. Ernest Pine, had concern of hematoma.  Patient was sent to the ED for further evaluation.  Patient endorses pain all over.  He endorses recent worsening of pain and swelling to the area of his fistula.  Home blood pressure medications previously included amlodipine, clonidine, hydralazine, labetalol, losartan.  He was seen by his PCP 4 days ago and was taken off of clonidine and labetalol due to side effects of generalized weakness and near syncope.  He states that he did take his morning blood pressure medicines.     Home Medications Prior to Admission medications   Medication Sig Start Date End Date Taking? Authorizing Provider  acetaminophen (TYLENOL) 325 MG tablet Take 2 tablets (650 mg total) by mouth every 6 (six) hours as needed for mild pain (or Fever >/= 101). 04/14/22   Lockie Mola, MD  amLODipine (NORVASC) 10 MG tablet TAKE 1 TABLET BY MOUTH EVERY DAY 04/10/22   Sabino Dick, DO  calcium acetate (PHOSLO) 667 MG capsule Take 1 capsule (667 mg total) by mouth 3 (three) times daily with meals. 01/04/22   Vonna Drafts, MD  cloNIDine (CATAPRES) 0.3 MG tablet Take 1 tablet (0.3 mg total) by mouth 3 (three) times daily. 11/14/22   Baglia, Corrina, PA-C  divalproex (DEPAKOTE ER) 500 MG 24 hr tablet Take 1 tablet (500 mg total) by mouth at bedtime. 08/05/22   Sabino Dick, DO  ferric citrate (AURYXIA) 1 GM 210 MG(Fe) tablet Take 210 mg by mouth 3 (three) times daily with meals. 07/26/19   [provider]  hydrALAZINE (APRESOLINE) 100 MG tablet Take 1 tablet (100 mg total) by mouth 3 (three) times daily. 05/20/22   Gaston Islam., NP  labetalol (NORMODYNE) 100 MG tablet Take 1 tablet (100 mg total) by mouth 2 (two) times daily. 11/14/22   Baglia, Corrina, PA-C  losartan (COZAAR) 100 MG tablet Take 1 tablet (100 mg total) by mouth daily. 11/15/22   Baglia, Corrina, PA-C  oxyCODONE-acetaminophen (PERCOCET) 5-325 MG tablet Take 1 tablet by mouth every 8 (eight) hours as needed for up to 4 days for severe pain. 11/24/22 11/28/22  Gloris Manchester, MD  pantoprazole (PROTONIX) 40 MG tablet TAKE 1 TABLET BY MOUTH EVERY DAY Patient taking differently: Take 40 mg by mouth 2 (two) times daily. 09/17/22   Sabino Dick, DO  sucralfate (CARAFATE) 1 g tablet Take 1 tablet (1 g total) by mouth 2 (two) times daily. 11/14/22   Baglia, Corrina, PA-C  SUMAtriptan (IMITREX) 50 MG tablet Take 1 tablet (50 mg total) by mouth every 2 (two) hours as needed for  migraine. Do not take more than 4 tablets in 24 hours. 08/05/22   Sabino Dick, DO  triamcinolone ointment (KENALOG) 0.1 % Apply to scrotum twice daily for itching. Do not use for more than 2 weeks continuously 05/30/22   Sabino Dick, DO      Allergies    Zestril [lisinopril], Nsaids, Carvedilol, and Risperdal [risperidone]    Review of Systems   Review of Systems  Musculoskeletal:  Positive for myalgias.  Neurological:  Positive for headaches.  All other systems reviewed and are negative.   Physical Exam Updated Vital Signs BP (!) 186/105   Pulse 91    Temp (!) 97.5 F (36.4 C)   Resp 16   Ht 5\' 8"  (1.727 m)   Wt 79.4 kg   SpO2 97%   BMI 26.61 kg/m  Physical Exam Vitals and nursing note reviewed.  Constitutional:      General: He is not in acute distress.    Appearance: He is well-developed. He is not toxic-appearing or diaphoretic.  HENT:     Head: Normocephalic and atraumatic.     Mouth/Throat:     Mouth: Mucous membranes are moist.  Eyes:     Extraocular Movements: Extraocular movements intact.     Conjunctiva/sclera: Conjunctivae normal.  Cardiovascular:     Rate and Rhythm: Regular rhythm. Tachycardia present.     Heart sounds: No murmur heard.    Comments: Firm swelling overlying upper right arm fistula. Pulmonary:     Effort: Pulmonary effort is normal. No respiratory distress.     Breath sounds: Normal breath sounds. No wheezing or rales.  Chest:     Chest wall: No tenderness.  Abdominal:     General: There is no distension.     Palpations: Abdomen is soft.     Tenderness: There is no abdominal tenderness.  Musculoskeletal:        General: Tenderness present. No swelling.     Cervical back: Normal range of motion and neck supple.  Skin:    General: Skin is warm and dry.     Coloration: Skin is not cyanotic or pale.  Neurological:     Mental Status: [name] and oriented to person, place, and time.  Psychiatric:        Mood and Affect: Mood is anxious.        Behavior: Behavior normal.     ED Results / Procedures / Treatments   Labs (all labs ordered are listed, but only abnormal results are displayed) Labs Reviewed  COMPREHENSIVE METABOLIC PANEL - Abnormal; Notable for the following components:      Result Value   Chloride 95 (*)    Glucose, Bld 107 (*)    BUN 69 (*)    Creatinine, Ser 13.77 (*)    Total Protein 5.9 (*)    Albumin 3.2 (*)    GFR, Estimated 4 (*)    Anion gap 20 (*)    All other components within normal limits  CBC WITH DIFFERENTIAL/PLATELET - Abnormal; Notable for the  following components:   RBC 2.49 (*)    Hemoglobin 7.6 (*)    HCT 23.8 (*)    All other components within normal limits  CK - Abnormal; Notable for the following components:   Total CK 414 (*)    All other components within normal limits  CULTURE, BLOOD (ROUTINE X 2)  CULTURE, BLOOD (ROUTINE X 2)  LACTIC ACID, PLASMA  URINALYSIS, ROUTINE W REFLEX MICROSCOPIC    EKG  None  Radiology CT Head Wo Contrast  Result Date: 11/24/2022 CLINICAL DATA:  Headache, increasing frequency or severity EXAM: CT HEAD WITHOUT CONTRAST TECHNIQUE: Contiguous axial images were obtained from the base of the skull through the vertex without intravenous contrast. RADIATION DOSE REDUCTION: This exam was performed according to the departmental dose-optimization program which includes automated exposure control, adjustment of the mA and/or kV according to patient size and/or use of iterative reconstruction technique. COMPARISON:  None Available. FINDINGS: Brain: No evidence of acute infarction, hemorrhage, hydrocephalus, extra-axial collection or mass lesion/mass effect. Vascular: No hyperdense vessel. Skull: No acute fracture. Sinuses/Orbits: Clear sinuses.  No acute orbital findings. Other: No mastoid effusions. IMPRESSION: No evidence of acute intracranial abnormality. Electronically Signed   By: Feliberto Harts M.D.   On: 11/24/2022 15:38    Procedures Procedures    Medications Ordered in ED Medications  hydrALAZINE (APRESOLINE) tablet 100 mg (100 mg Oral Given 11/24/22 1507)  HYDROmorphone (DILAUDID) injection 1 mg (1 mg Intravenous Given 11/24/22 1229)  cloNIDine (CATAPRES) tablet 0.2 mg (0.2 mg Oral Given 11/24/22 1506)    ED Course/ Medical Decision Making/ A&P                             Medical Decision Making Amount and/or Complexity of Data Reviewed Labs: ordered. Radiology: ordered.  Risk Prescription drug management.   This patient presents to the ED for concern of right arm pain and  swelling, this involves an extensive number of treatment options, and is a complaint that carries with it a high risk of complications and morbidity.  The differential diagnosis includes hematoma, infection, recurrence of aneurysm, thrombosis   Co morbidities that complicate the patient evaluation  ESRD, bipolar disorder, depression, GERD, HTN, anemia   Additional history obtained:  Additional history obtained from patient's mother-in-law External records from outside source obtained and reviewed including EMR   Lab Tests:  I Ordered, and personally interpreted labs.  The pertinent results include: Baseline anemia, baseline elevations in creatinine and BUN consistent with ESRD, normal lactate, no leukocytosis   Imaging Studies ordered:  I ordered imaging studies including CT head I independently visualized and interpreted imaging which showed no acute findings I agree with the radiologist interpretation   Cardiac Monitoring: / EKG:  The patient was maintained on a cardiac monitor.  I personally viewed and interpreted the cardiac monitored which showed an underlying rhythm of: Sinus rhythm   Consultations Obtained:  I requested consultation with the vascular surgeon, Dr. Randie Heinz,  and discussed lab and imaging findings as well as pertinent plan - they recommend: Outpatient follow-up   Problem List / ED Course / Critical interventions / Medication management  Patient presents for evaluation of right upper arm fistula.  He has had this fistula for several years.  He underwent a revision of this last month.  He has since had pain and swelling to the area and has been undergoing dialysis through a tunneled catheter in his right upper chest.  He was seen by his nephrologist today who advised him to come to the ED.  On arrival, patient endorses pain everywhere.  Dilaudid was ordered for analgesia.  Vital signs are notable for hypertension.  Per chart review, he had similar elevation in  his blood pressure at PCP visit 4 days ago.  He was taken off of some of his home blood pressure medications at the time.  Patient had improved pain following Dilaudid.  Despite  this, he had continued elevated blood pressure.  Since he was recently taken off of clonidine, this may be rebound hypertension.  He was given a dose of clonidine in the ED.  Home dose of hydralazine was ordered as well.  On inspection of his fistula site, the area is swollen, tender, and indurated.  There is no significant warmth.  I spoke with vascular surgeon, Dr. Randie Heinz, who graciously came and evaluated the patient in the ED. Dr. Randie Heinz did not identify any changes from prior exams.  He feels the patient needs to follow-up with Dr. Edilia Bo as an outpatient.  This was explained to the patient.  Since that there was some miscommunication and unrealistic expectations set by his nephrologist today.  I spoke with his mother-in-law over the telephone who is agreeable to outpatient follow-up and will help the patient get to the vascular surgery office.  Email was sent to his vascular surgeon to help coordinate close follow-up.  Patient was discharged in stable condition. I ordered medication including Dilaudid for analgesia; clonidine and hydralazine for hypertension Reevaluation of the patient after these medicines showed that the patient improved I have reviewed the patients home medicines and have made adjustments as needed   Social Determinants of Health:  Poor medical literacy, has access to outpatient care        Final Clinical Impression(s) / ED Diagnoses Final diagnoses:  Swelling of upper arm    Rx / DC Orders ED Discharge Orders          Ordered    oxyCODONE-acetaminophen (PERCOCET) 5-325 MG tablet  Every 8 hours PRN        11/24/22 1558              Gloris Manchester, MD 11/24/22 1604

## 2022-11-24 NOTE — ED Notes (Signed)
Pt demanding to speak to a Supervisor prior to discharge.  Consulting civil engineer at bedside.

## 2022-11-24 NOTE — ED Notes (Signed)
ED Provider at bedside. 

## 2022-11-24 NOTE — ED Notes (Signed)
 Vascular at bedside

## 2022-11-24 NOTE — ED Notes (Signed)
Phlebotomy asked to draw 2nd set of cultures.

## 2022-11-24 NOTE — ED Triage Notes (Signed)
PT bib EMS from dialysis. Pt went to dialysis to have fistula assessed. Fistula was placed in right upper arm June 28th and has been red and swelling. Fistula has not been used and was advised to come to ER for further assessment. Pt also complains of all over body aches and headache.   Dialysis days  T/TH/Sat  EMS  240/100 16RR 100 HR 97% RA

## 2022-11-25 ENCOUNTER — Telehealth: Payer: Self-pay

## 2022-11-25 ENCOUNTER — Telehealth (HOSPITAL_COMMUNITY): Payer: Self-pay | Admitting: Emergency Medicine

## 2022-11-25 LAB — BLOOD CULTURE ID PANEL (REFLEXED) - BCID2

## 2022-11-25 NOTE — Telephone Encounter (Signed)
Transition Care Management Unsuccessful Follow-up Telephone Call  Date of discharge and from where:  [facility] 7/9  Attempts:  1st Attempt  Reason for unsuccessful TCM follow-up call:  No answer/busy   Lenard Forth [facility] Guide, [facility] [phone] [address] [place], [place], [place] [address] Phone: [phone] Email: [email or web address]

## 2022-11-25 NOTE — Telephone Encounter (Signed)
Transition Care Management Unsuccessful Follow-up Telephone Call  Date of discharge and from where:  [facility] 7/9  Attempts:  2nd Attempt  Reason for unsuccessful TCM follow-up call:  No answer/busy   Lenard Forth [facility] Guide, [facility] [phone] [address] [place], [place], [place] [address] Phone: [phone] Email: [email or web address]

## 2022-11-25 NOTE — Telephone Encounter (Signed)
Attempted to call patient regarding his positive staph epidermis blood culture in one of his culture bottles.  Was unable to reach the patient.  Did not have a voicemail that was set up.

## 2022-11-25 NOTE — Telephone Encounter (Signed)
Triage appt for 7/18

## 2022-11-25 NOTE — Patient Outreach (Signed)
  Care Coordination   11/25/2022 Name: [name] MRN: [id number] DOB: Aug 10, 1985   Care Coordination Outreach Attempts:  An unsuccessful telephone outreach was attempted today to offer the patient information about available care coordination services.  Follow Up Plan:  Additional outreach attempts will be made to offer the patient care coordination information and services.   Encounter Outcome:  No Answer   Care Coordination Interventions:  No, not indicated    Juanell Fairly RN, BSN, [facility] Care Coordinator [facility]   Phone: [phone]

## 2022-11-25 NOTE — Telephone Encounter (Signed)
Ann with [facility] called requesting a triage appt.  Reviewed pt's chart, returned call for clarification, two identifiers used. Pt went to ED per Dr. Ernest Pine yesterday. Dr. Randie Heinz consulted and recommended a f/u appt with Dr. Edilia Bo outpatient. Appt for 7/18 sch with PA. Pt given appt information in person at HD center. Confirmed understanding.

## 2022-11-27 ENCOUNTER — Ambulatory Visit: Payer: Medicare Other

## 2022-11-27 ENCOUNTER — Encounter: Payer: Self-pay | Admitting: Physician Assistant

## 2022-11-27 VITALS — BP 196/104 | HR 110 | Temp 98.9°F | Resp 16 | Wt 162.6 lb

## 2022-11-27 DIAGNOSIS — Z992 Dependence on renal dialysis: Secondary | ICD-10-CM

## 2022-11-27 DIAGNOSIS — T82898A Other specified complication of vascular prosthetic devices, implants and grafts, initial encounter: Secondary | ICD-10-CM

## 2022-11-27 DIAGNOSIS — N186 End stage renal disease: Secondary | ICD-10-CM

## 2022-11-27 LAB — CULTURE, BLOOD (ROUTINE X 2)

## 2022-11-27 NOTE — Progress Notes (Signed)
POST OPERATIVE OFFICE NOTE    CC:  F/u for surgery  HPI:  This is a 37 y.o. male who is s/p excision of large aneurysmal right upper arm fistula with revision using PTFE segment by Dr. Edilia Bo on 11/07/2022.  A right IJ TDC was also placed at the same time.  He was seen in the ER several days ago due to pain at the incision site.  He is without fever or white count.  He did have 1 blood culture bottle grow Staph epidermidis however continues to be without fevers, chills, nausea/vomiting, drainage.  Patient also states that the right IJ [facility] is not working as well as it used to.  He was told by the dialysis nurses that this may need to be exchanged.  He is here today with his stepmother.  They are upset and frustrated with the care they have received from [facility].  Mr. Sinn would like his right arm graft excised as well as catheter exchanged.  After discussion the patient and his mother-in-law would prefer to have a referral to [facility] where he currently has been meeting with the transplant service.  Allergies  Allergen Reactions   Zestril [Lisinopril] Swelling   Nsaids Other (See Comments)    "Kidney problems "   Carvedilol Other (See Comments)    Makes mouth numb   Risperdal [Risperidone] Other (See Comments)    Unknown reaction     Current Outpatient Medications  Medication Sig Dispense Refill   acetaminophen (TYLENOL) 325 MG tablet Take 2 tablets (650 mg total) by mouth every 6 (six) hours as needed for mild pain (or Fever >/= 101).     amLODipine (NORVASC) 10 MG tablet TAKE 1 TABLET BY MOUTH EVERY DAY 90 tablet 3   calcium acetate (PHOSLO) 667 MG capsule Take 1 capsule (667 mg total) by mouth 3 (three) times daily with meals. 100 capsule 0   cloNIDine (CATAPRES) 0.3 MG tablet Take 1 tablet (0.3 mg total) by mouth 3 (three) times daily. 60 tablet 11   divalproex (DEPAKOTE ER) 500 MG 24 hr tablet Take 1 tablet (500 mg total) by mouth at bedtime. 30 tablet 2   ferric citrate  (AURYXIA) 1 GM 210 MG(Fe) tablet Take 210 mg by mouth 3 (three) times daily with meals.     hydrALAZINE (APRESOLINE) 100 MG tablet Take 1 tablet (100 mg total) by mouth 3 (three) times daily. 270 tablet 1   labetalol (NORMODYNE) 100 MG tablet Take 1 tablet (100 mg total) by mouth 2 (two) times daily. 60 tablet 1   losartan (COZAAR) 100 MG tablet Take 1 tablet (100 mg total) by mouth daily. 30 tablet 1   oxyCODONE-acetaminophen (PERCOCET) 5-325 MG tablet Take 1 tablet by mouth every 8 (eight) hours as needed for up to 4 days for severe pain. 12 tablet 0   pantoprazole (PROTONIX) 40 MG tablet TAKE 1 TABLET BY MOUTH EVERY DAY (Patient taking differently: Take 40 mg by mouth 2 (two) times daily.) 90 tablet 1   sucralfate (CARAFATE) 1 g tablet Take 1 tablet (1 g total) by mouth 2 (two) times daily. 60 tablet 1   SUMAtriptan (IMITREX) 50 MG tablet Take 1 tablet (50 mg total) by mouth every 2 (two) hours as needed for migraine. Do not take more than 4 tablets in 24 hours. 20 tablet 0   triamcinolone ointment (KENALOG) 0.1 % Apply to scrotum twice daily for itching. Do not use for more than 2 weeks continuously 30 g  1   Current Facility-Administered Medications  Medication Dose Route Frequency Provider Last Rate Last Admin   0.9 %  sodium chloride infusion  250 mL Intravenous PRN Chuck Hint, MD       sodium chloride flush (NS) 0.9 % injection 3 mL  3 mL Intravenous Q12H Chuck Hint, MD         ROS:  See HPI  Physical Exam:  Vitals:   11/27/22 1338  BP: (!) 196/104  Pulse: (!) 110  Resp: 16  Temp: 98.9 F (37.2 C)  TempSrc: Temporal  SpO2: 97%  Weight: 162 lb 9.6 oz (73.8 kg)    Incision: Incisions of right arm are healed Extremities: Palpable thrill through revised fistula/graft; obvious fluid collection/hematoma; palpable radial pulse  Assessment/Plan:  This is a 37 y.o. male who is s/p excision of right arm AV fistula with revision using PTFE segment as well as TDC  placement  -Mr. Michal Callicott is a 37 year old male who recently underwent excision of aneurysmal right arm AV fistula with revision using PTFE.  TDC was also placed at the same time.  He has been in and out of the emergency department with concern for fluid collection and hematoma in the right arm at the surgery site.  The appearance of the arm is unchanged from ER visit on 11/24/2022.  He and his stepmother are frustrated with the care they have received by [facility].  They would prefer a referral to [facility] where he is currently meeting with the transplant team.  The patient also mention the dialysis center is concerned about the performance of his right IJ TDC.  He continue HD with his current catheter.  The dialysis center can use tPA if indicated.  [facility] can exchange the catheter should he require catheter exchange.  I have placed a referral for the patient to be seen by [facility] vascular surgery team for future dialysis access needs.  The patient will follow-up on an as-needed basis.  If [facility] is unable to perform a catheter exchange prior to patient establishing with [facility], his stepmother will notify our office.  He will otherwise follow-up on an as-needed basis.   Emilie Rutter, PA-C [facility] [phone]  Clinic MD:  Edilia Bo

## 2022-11-29 LAB — CULTURE, BLOOD (ROUTINE X 2): Culture: NO GROWTH

## 2022-12-03 ENCOUNTER — Other Ambulatory Visit: Payer: Self-pay

## 2022-12-03 ENCOUNTER — Ambulatory Visit (INDEPENDENT_AMBULATORY_CARE_PROVIDER_SITE_OTHER): Payer: Medicare Other | Admitting: Pharmacist

## 2022-12-03 ENCOUNTER — Inpatient Hospital Stay (HOSPITAL_COMMUNITY)
Admission: EM | Admit: 2022-12-03 | Discharge: 2022-12-05 | DRG: 377 | Disposition: A | Discharge status: Home / Self Care | Payer: Medicare Other | Attending: Family Medicine | Admitting: Family Medicine

## 2022-12-03 ENCOUNTER — Encounter (HOSPITAL_COMMUNITY): Payer: Self-pay

## 2022-12-03 ENCOUNTER — Ambulatory Visit: Payer: Medicare Other | Admitting: Family Medicine

## 2022-12-03 ENCOUNTER — Emergency Department (HOSPITAL_COMMUNITY): Payer: Medicare Other

## 2022-12-03 VITALS — BP 189/104 | HR 98 | Ht 69.8 in | Wt 170.2 lb

## 2022-12-03 DIAGNOSIS — G43809 Other migraine, not intractable, without status migrainosus: Secondary | ICD-10-CM

## 2022-12-03 DIAGNOSIS — K2101 Gastro-esophageal reflux disease with esophagitis, with bleeding: Secondary | ICD-10-CM | POA: Diagnosis present

## 2022-12-03 DIAGNOSIS — K2931 Chronic superficial gastritis with bleeding: Secondary | ICD-10-CM

## 2022-12-03 DIAGNOSIS — K922 Gastrointestinal hemorrhage, unspecified: Principal | ICD-10-CM | POA: Diagnosis present

## 2022-12-03 DIAGNOSIS — Q8781 Alport syndrome: Secondary | ICD-10-CM

## 2022-12-03 DIAGNOSIS — N186 End stage renal disease: Secondary | ICD-10-CM

## 2022-12-03 DIAGNOSIS — Z992 Dependence on renal dialysis: Secondary | ICD-10-CM

## 2022-12-03 DIAGNOSIS — Z823 Family history of stroke: Secondary | ICD-10-CM

## 2022-12-03 DIAGNOSIS — F319 Bipolar disorder, unspecified: Secondary | ICD-10-CM | POA: Diagnosis present

## 2022-12-03 DIAGNOSIS — K644 Residual hemorrhoidal skin tags: Secondary | ICD-10-CM | POA: Diagnosis present

## 2022-12-03 DIAGNOSIS — Z822 Family history of deafness and hearing loss: Secondary | ICD-10-CM

## 2022-12-03 DIAGNOSIS — Z79899 Other long term (current) drug therapy: Secondary | ICD-10-CM

## 2022-12-03 DIAGNOSIS — I5042 Chronic combined systolic (congestive) and diastolic (congestive) heart failure: Secondary | ICD-10-CM | POA: Diagnosis present

## 2022-12-03 DIAGNOSIS — Z841 Family history of disorders of kidney and ureter: Secondary | ICD-10-CM

## 2022-12-03 DIAGNOSIS — I1A0 Resistant hypertension: Secondary | ICD-10-CM | POA: Diagnosis present

## 2022-12-03 DIAGNOSIS — K226 Gastro-esophageal laceration-hemorrhage syndrome: Secondary | ICD-10-CM | POA: Diagnosis present

## 2022-12-03 DIAGNOSIS — F1223 Cannabis dependence with withdrawal: Secondary | ICD-10-CM

## 2022-12-03 DIAGNOSIS — D509 Iron deficiency anemia, unspecified: Secondary | ICD-10-CM | POA: Diagnosis present

## 2022-12-03 DIAGNOSIS — K625 Hemorrhage of anus and rectum: Secondary | ICD-10-CM | POA: Diagnosis present

## 2022-12-03 DIAGNOSIS — H9190 Unspecified hearing loss, unspecified ear: Secondary | ICD-10-CM | POA: Diagnosis present

## 2022-12-03 DIAGNOSIS — K3189 Other diseases of stomach and duodenum: Secondary | ICD-10-CM | POA: Diagnosis present

## 2022-12-03 DIAGNOSIS — R5381 Other malaise: Secondary | ICD-10-CM | POA: Diagnosis present

## 2022-12-03 DIAGNOSIS — I10 Essential (primary) hypertension: Secondary | ICD-10-CM

## 2022-12-03 DIAGNOSIS — Z8249 Family history of ischemic heart disease and other diseases of the circulatory system: Secondary | ICD-10-CM

## 2022-12-03 DIAGNOSIS — K2961 Other gastritis with bleeding: Secondary | ICD-10-CM | POA: Diagnosis present

## 2022-12-03 DIAGNOSIS — E860 Dehydration: Secondary | ICD-10-CM | POA: Diagnosis present

## 2022-12-03 DIAGNOSIS — F12188 Cannabis abuse with other cannabis-induced disorder: Secondary | ICD-10-CM | POA: Diagnosis present

## 2022-12-03 DIAGNOSIS — R451 Restlessness and agitation: Secondary | ICD-10-CM | POA: Diagnosis present

## 2022-12-03 DIAGNOSIS — R1013 Epigastric pain: Secondary | ICD-10-CM

## 2022-12-03 DIAGNOSIS — K920 Hematemesis: Secondary | ICD-10-CM | POA: Diagnosis not present

## 2022-12-03 DIAGNOSIS — R519 Headache, unspecified: Secondary | ICD-10-CM | POA: Diagnosis present

## 2022-12-03 DIAGNOSIS — K264 Chronic or unspecified duodenal ulcer with hemorrhage: Secondary | ICD-10-CM | POA: Diagnosis not present

## 2022-12-03 DIAGNOSIS — E162 Hypoglycemia, unspecified: Secondary | ICD-10-CM | POA: Diagnosis present

## 2022-12-03 DIAGNOSIS — D62 Acute posthemorrhagic anemia: Secondary | ICD-10-CM | POA: Diagnosis present

## 2022-12-03 DIAGNOSIS — D649 Anemia, unspecified: Secondary | ICD-10-CM | POA: Diagnosis present

## 2022-12-03 DIAGNOSIS — D631 Anemia in chronic kidney disease: Secondary | ICD-10-CM | POA: Diagnosis not present

## 2022-12-03 DIAGNOSIS — F17210 Nicotine dependence, cigarettes, uncomplicated: Secondary | ICD-10-CM | POA: Diagnosis present

## 2022-12-03 DIAGNOSIS — Z833 Family history of diabetes mellitus: Secondary | ICD-10-CM

## 2022-12-03 DIAGNOSIS — Z888 Allergy status to other drugs, medicaments and biological substances status: Secondary | ICD-10-CM

## 2022-12-03 DIAGNOSIS — Z886 Allergy status to analgesic agent status: Secondary | ICD-10-CM

## 2022-12-03 DIAGNOSIS — Z825 Family history of asthma and other chronic lower respiratory diseases: Secondary | ICD-10-CM

## 2022-12-03 DIAGNOSIS — K449 Diaphragmatic hernia without obstruction or gangrene: Secondary | ICD-10-CM | POA: Diagnosis present

## 2022-12-03 DIAGNOSIS — I132 Hypertensive heart and chronic kidney disease with heart failure and with stage 5 chronic kidney disease, or end stage renal disease: Secondary | ICD-10-CM | POA: Diagnosis present

## 2022-12-03 DIAGNOSIS — K648 Other hemorrhoids: Secondary | ICD-10-CM | POA: Diagnosis present

## 2022-12-03 DIAGNOSIS — G43909 Migraine, unspecified, not intractable, without status migrainosus: Secondary | ICD-10-CM | POA: Diagnosis present

## 2022-12-03 LAB — TYPE AND SCREEN
ABO/RH(D): O POS
Antibody Screen: NEGATIVE

## 2022-12-03 LAB — CBC
HCT: 23.2 % — ABNORMAL LOW (ref 39.0–52.0)
Hemoglobin: 7.4 g/dL — ABNORMAL LOW (ref 13.0–17.0)
MCH: 31.8 pg (ref 26.0–34.0)
MCHC: 31.9 g/dL (ref 30.0–36.0)
MCV: 99.6 fL (ref 80.0–100.0)
Platelets: 243 10*3/uL (ref 150–400)
RBC: 2.33 MIL/uL — ABNORMAL LOW (ref 4.22–5.81)
RDW: 15.9 % — ABNORMAL HIGH (ref 11.5–15.5)
WBC: 8.4 10*3/uL (ref 4.0–10.5)
nRBC: 0.4 % — ABNORMAL HIGH (ref 0.0–0.2)

## 2022-12-03 LAB — COMPREHENSIVE METABOLIC PANEL
ALT: 21 U/L (ref 0–44)
AST: 28 U/L (ref 15–41)
Albumin: 3.5 g/dL (ref 3.5–5.0)
Alkaline Phosphatase: 66 U/L (ref 38–126)
Anion gap: 17 — ABNORMAL HIGH (ref 5–15)
BUN: 41 mg/dL — ABNORMAL HIGH (ref 6–20)
CO2: 26 mmol/L (ref 22–32)
Calcium: 9.4 mg/dL (ref 8.9–10.3)
Chloride: 94 mmol/L — ABNORMAL LOW (ref 98–111)
Creatinine, Ser: 9.4 mg/dL — ABNORMAL HIGH (ref 0.61–1.24)
GFR, Estimated: 7 mL/min — ABNORMAL LOW (ref >60)
Glucose, Bld: 80 mg/dL (ref 70–99)
Potassium: 4.3 mmol/L (ref 3.5–5.1)
Sodium: 137 mmol/L (ref 135–145)
Total Bilirubin: 0.2 mg/dL — ABNORMAL LOW (ref 0.3–1.2)
Total Protein: 6.3 g/dL — ABNORMAL LOW (ref 6.5–8.1)

## 2022-12-03 LAB — CBG MONITORING, ED: Glucose-Capillary: 71 mg/dL (ref 70–99)

## 2022-12-03 LAB — VALPROIC ACID LEVEL: Valproic Acid Lvl: <10 ug/mL — ABNORMAL LOW (ref 50.0–100.0)

## 2022-12-03 LAB — GLUCOSE, CAPILLARY: Glucose-Capillary: 71 mg/dL (ref 70–99)

## 2022-12-03 LAB — LIPASE, BLOOD
Lipase: 88 U/L — ABNORMAL HIGH (ref 11–51)
Lipase: 79 U/L — ABNORMAL HIGH (ref 11–51)

## 2022-12-03 MED ORDER — PANTOPRAZOLE SODIUM 40 MG IV SOLR
40.0000 mg | Freq: Once | INTRAVENOUS | Status: DC
  Filled 2022-12-03: qty 10

## 2022-12-03 MED ORDER — CALCIUM ACETATE (PHOS BINDER) 667 MG PO CAPS
667.0000 mg | ORAL_CAPSULE | Freq: Three times a day (TID) | ORAL | Status: DC
  Administered 2022-12-03 – 2022-12-05 (×5): 667 mg via ORAL
  Filled 2022-12-03 (×5): qty 1

## 2022-12-03 MED ORDER — LABETALOL HCL 100 MG PO TABS
100.0000 mg | ORAL_TABLET | Freq: Two times a day (BID) | ORAL | Status: DC
  Administered 2022-12-03 – 2022-12-04 (×2): 100 mg via ORAL
  Filled 2022-12-03: qty 1
  Filled 2022-12-03: qty 0.5
  Filled 2022-12-03: qty 1
  Filled 2022-12-03: qty 0.5

## 2022-12-03 MED ORDER — AMLODIPINE BESYLATE 10 MG PO TABS
10.0000 mg | ORAL_TABLET | Freq: Every day | ORAL | Status: DC
  Administered 2022-12-03 – 2022-12-04 (×2): 10 mg via ORAL
  Filled 2022-12-03 (×2): qty 2

## 2022-12-03 MED ORDER — METOCLOPRAMIDE HCL 5 MG/ML IJ SOLN
10.0000 mg | Freq: Once | INTRAMUSCULAR | Status: DC
  Filled 2022-12-03: qty 2

## 2022-12-03 MED ORDER — DIVALPROEX SODIUM ER 500 MG PO TB24
500.0000 mg | ORAL_TABLET | Freq: Every day | ORAL | Status: DC
  Administered 2022-12-03 – 2022-12-04 (×2): 500 mg via ORAL
  Filled 2022-12-03 (×3): qty 1

## 2022-12-03 MED ORDER — CAPSAICIN 0.025 % EX CREA
TOPICAL_CREAM | Freq: Two times a day (BID) | CUTANEOUS | Status: DC
  Filled 2022-12-03: qty 60

## 2022-12-03 MED ORDER — ACETAMINOPHEN 325 MG PO TABS
ORAL_TABLET | ORAL | Status: AC
  Administered 2022-12-03: 650 mg via ORAL
  Filled 2022-12-03: qty 2

## 2022-12-03 MED ORDER — CLONIDINE HCL 0.1 MG PO TABS
0.1000 mg | ORAL_TABLET | Freq: Two times a day (BID) | ORAL | Status: DC
  Administered 2022-12-03 – 2022-12-04 (×2): 0.1 mg via ORAL
  Filled 2022-12-03 (×2): qty 1

## 2022-12-03 MED ORDER — ACETAMINOPHEN 325 MG PO TABS: 650.0000 mg | ORAL_TABLET | Freq: Once | ORAL | Status: AC

## 2022-12-03 MED ORDER — PANTOPRAZOLE SODIUM 40 MG PO TBEC
40.0000 mg | DELAYED_RELEASE_TABLET | Freq: Two times a day (BID) | ORAL | Status: DC
  Administered 2022-12-03 – 2022-12-04 (×3): 40 mg via ORAL
  Filled 2022-12-03 (×3): qty 1

## 2022-12-03 MED ORDER — LOSARTAN POTASSIUM 50 MG PO TABS
100.0000 mg | ORAL_TABLET | Freq: Every day | ORAL | Status: DC
  Administered 2022-12-03 – 2022-12-05 (×3): 100 mg via ORAL
  Filled 2022-12-03 (×3): qty 2

## 2022-12-03 MED ORDER — ACETAMINOPHEN 325 MG PO TABS
650.0000 mg | ORAL_TABLET | Freq: Four times a day (QID) | ORAL | Status: DC | PRN
  Filled 2022-12-03: qty 2

## 2022-12-03 MED ORDER — METOCLOPRAMIDE HCL 10 MG PO TABS: 10.0000 mg | ORAL_TABLET | Freq: Four times a day (QID) | ORAL | Status: DC | PRN

## 2022-12-03 MED ORDER — ACETAMINOPHEN 650 MG RE SUPP: 650.0000 mg | Freq: Four times a day (QID) | RECTAL | Status: DC | PRN

## 2022-12-03 MED ORDER — DEXTROSE 50 % IV SOLN
1.0000 | Freq: Once | INTRAVENOUS | Status: AC
  Administered 2022-12-03: 50 mL via INTRAVENOUS
  Filled 2022-12-03: qty 50

## 2022-12-03 MED ORDER — HYDRALAZINE HCL 50 MG PO TABS
100.0000 mg | ORAL_TABLET | Freq: Three times a day (TID) | ORAL | Status: DC
  Administered 2022-12-03 – 2022-12-05 (×6): 100 mg via ORAL
  Filled 2022-12-03 (×7): qty 2

## 2022-12-03 MED ORDER — CLONIDINE HCL 0.1 MG PO TABS
0.1000 mg | ORAL_TABLET | Freq: Two times a day (BID) | ORAL | 11 refills | Status: DC
Start: 2022-12-03 — End: 2023-01-28

## 2022-12-03 MED ORDER — PANTOPRAZOLE SODIUM 40 MG PO TBEC: 40.0000 mg | DELAYED_RELEASE_TABLET | Freq: Every day | ORAL | Status: DC

## 2022-12-03 NOTE — H&P (Addendum)
Hospital Admission History and Physical Service Pager: [phone]  Patient name: [name] Medical record number: [id number] Date of Birth: 03/09/1986 Age: 37 y.o. Gender: male  Primary Care Provider: [name], DO Consultants: GI Code Status: Full Code Preferred Emergency Contact:  Contact Information     Name Relation Home Work [place] Other [phone]  [phone]      Other Contacts     Name Relation Home Work Mobile   [place]   [phone]   Spencer,John Father   [phone]        Chief Complaint: hematemesis and hematochezia  Assessment and Plan: [name] is a 37 y.o. male with past medical history of Alport Syndrome, ESRD on HD (TTS), resistant HTN presenting with hematemesis and hematochezia. Differential for presentation of this includes GI bleed 2/2 Mallory-Weiss tear, gastritis, cannabinoid hyperemesis syndrome, colitis, hemorrhoids, and anemia of chronic disease.   Ddx for Hyperemesis: Mallory-Weiss tear from repeated episodes of vomiting. Gastritis could be likely due to his complaints of heart burn and flare prior to episodes of vomiting. Cannabinoid hyperemesis syndrome due to history of marijuana use.  Ddx for Hematochezia: Colitis to  be considered due to recurrent episodes of hematochezia, however unlikely due to  soft, non-tender abdomen. Hemorrhoids could also be causing the blood in his bowel movements due to flex sig in 12/23 demonstrating internal and external hemorrhoids. Anemia of chronic disease is also likely due to his weakness and malaise along with low Hgb, will check iron panel  to rule out iron deficiency anemia.   Likely GI bleed, due to severity of episodes of hematemesis and hematochezia, will follow up with GI to see if EGD/colonoscopy is indicated at this time.   [facility]     * (Principal) Acute GI bleeding     Patient has a history of hematochezia for the past 2 weeks with large   volumes and streaks of blood. Last BM yesterday evening. 5 episodes of  vomiting overnight, large in volume and streaked with blood. Last episode  at 5 am today. Associates with dizziness, lightheadedness, and severe  headache.Hgb stable in the ED at 7.4. Last EGD 11/12/22 which showed  gastritis, gastric erosion, 3 cm hiatal hernia, and duodenitis. - Admit to FMTS, attending Dr. Manson Passey - Med-Surg, Vital signs per floor - Soft diet, NPO at MN for possible GI intervention  - VTE prophylaxis: SCDs - PO Reglan for nausea and headache - Topical capsaicin for cannabis hyperemesis  - EKG pending - AM CBC/RFP - GI on board, will consider EGD vs colonoscopy - strict I&Os        Anemia     2/2  acute blood loss v chronic disease.  - Iron studies: Fe, TIBC, Ferritin - CBC AM  - low threshold to recheck earlier if begins having episodes of bleeding  - transfusion <7        Headache     Patient was agitated upon admission due to severe headache, likely due  to dehydration from vomiting, bloody bowel movements, elevated BP, and h/o  migraines. Previously on Imitrex for migraines but was Dc'd at pt's  request.  - PO Reglan to manage his headaches - Tylenol  - Once IV established consider IV compazine      Chronic Stable Conditions ESRD: on HDTTS, last session reported on Tuesday, nephrology following inpatient  HTN: Continued home medications: Amlodipine 10 mg daily, Clonidine 0.1 mg daily,  Hydralazine 100 mg TID, Labetalol 100 mg BID, Losartan 100 mg daily  Bipolar Disorder: Continued home Depakote 500 mg daily  FEN/GI: clear diet VTE Prophylaxis: SCDs, high risk for GI bleeds  Disposition: Med-Surg  History of Present Illness:  [name] is a 37 y.o. male presenting to the ED from clinic with hematemesis and hematochezia. Per chart review, patient notes that has been having some maroon-colored stool for some time. He has generalized malaise, headache, and feels hypoglycemic, and  his blood pressure is up. Denies any discrete abdominal pain at this time. No use of NSAIDs or blood thinners.   Believes his vomiting episodes are related to his heart burn, last episode was 5 am this morning, but hasn't occurred since due to not having much oral intake. He describes his vomiting episodes as large in volume and streaked with blood. He reports that he has been pooping blood for 2 weeks, last BM yesterday evening which also contained blood. History limited due to agitation.   In the ED, Hgb stable at 7.4. No electrolyte abnormalities. Started on PO Protonix 40 mg and IV Reglan 10 mg. GI was consulted and discussed there is no indication for emergent procedure at this time, but appropriate to admit and they will follow. Lipase mildly elevated 88, but appears has been elevated prior.   Review Of Systems: Per HPI with the following additions: as above   Pertinent Past Medical History: HTN, Alport syndrome, ESRD on HD TTS   Remainder reviewed in history tab.   Pertinent Past Surgical History: Appendectomy AV Fistula placement (L) Bascilic Vein Transposition Colonoscopy 10/2021 EGD 05/2022 Flex Sig 12/23  Remainder reviewed in history tab.   Pertinent Social History: Tobacco use: Former Alcohol use: Not currently Other Substance use: Denies, h/o mariajuana  Lives with self  Pertinent Family History: Mother - diabetes, HTN, kidney failure; father - CV disease, hearing loss; maternal grandfather - stroke; maternal grandmother - CV disease; sister - HTN.   Remainder reviewed in history tab.   Important Outpatient Medications: Amlodipine 10 mg daily Losartan 100 mg daily Labetalol 100 mg BID Clonidine 0.1 mg BID Hydralazine 100 mg TID Depakote 500 mg daily  Protonix 40 mg daily  Carafate 1 g tab daily  Patient takes all meds after dialysis on dialysis days (T, Th, S) and in the AM on non-dialysis days.   Remainder reviewed in medication history.   Objective: BP  (!) 208/117   Pulse (!) 107   Temp 98.2 F (36.8 C) (Oral)   Resp (!) 21   Ht 5\' 9"  (1.753 m)   Wt 77.1 kg   SpO2 92%   BMI 25.10 kg/m  Exam: General: agitated due to his headache ENTM: deaf d/t Alport Syndrome Psych: agitated, hxof bipolar disorder Extremities: RUE fistula with aneurysm Exam limited due to agitation   Labs:  CBC BMET  Recent Labs  Lab 12/03/22 1121  WBC 8.4  HGB 7.4*  HCT 23.2*  PLT 243   Recent Labs  Lab 12/03/22 1121  NA 137  K 4.3  CL 94*  CO2 26  BUN 41*  CREATININE 9.40*  GLUCOSE 80  CALCIUM 9.4    Lipase: 79  Valproic acid: <10  EKG: Rate 100, NSR  Imaging Studies Performed: CXR Radiologist Impression:  Cardiomegaly with early interstitial edema pattern. Bibasilar atelectasis.  Fortunato Curling, DO   I have reviewed the above and made the necessary changes.   Glendale Chard, DO 12/03/2022, 5:20 PM PGY-2,   Family Medicine  FPTS Intern pager: [phone], text pages welcome Secure chat group [facility] Teaching Service

## 2022-12-03 NOTE — Progress Notes (Signed)
   S:     Chief Complaint  Patient presents with   Medication Management    Hypertension   37 y.o. male who presents for hypertension evaluation, education, and management.  PMH is significant for Hypertension and dialysis.  Patient was referred and last seen by Primary Care Provider, Dr. Karle Plumber, on 11/20/2022.   At last visit, patient admitted non-adherence to blood pressure agents.   Today, patient arrives in fair spirits and presents without assistance.  Denies dizziness, headache, blurred vision, swelling.  However he reports vomiting blood this morning and having blood in stool.  He believes this bleeding is new since his hospitalization / procedure.   Current antihypertensives include: Reports taking amlodpine 10mg  daily, hydralazine 100mg  BID, and Losartan 100mg  daily  Antihypertensives tried in the past include: labetolol and clonidine intolerance reported - still has supply.   Reported home BP readings: not currently checking  O:  Review of Systems  Respiratory:  Positive for hemoptysis.   Gastrointestinal:  Positive for blood in stool.    Physical Exam Pulmonary:     Effort: Pulmonary effort is normal.  Neurological:     Mental Status: [name].  Psychiatric:        Behavior: Behavior normal.        Thought Content: Thought content normal.     Last 3 Office BP readings: BP Readings from Last 3 Encounters:  12/03/22 (!) 208/117  12/03/22 (!) 189/104  11/27/22 (!) 196/104    BMET    Component Value Date/Time   NA 137 12/03/2022 1121   NA 139 06/16/2022 1606   K 4.3 12/03/2022 1121   CL 94 (L) 12/03/2022 1121   CO2 26 12/03/2022 1121   GLUCOSE 80 12/03/2022 1121   BUN 41 (H) 12/03/2022 1121   BUN 72 (H) 06/16/2022 1606   CREATININE 9.40 (H) 12/03/2022 1121   CALCIUM 9.4 12/03/2022 1121   GFRNONAA 7 (L) 12/03/2022 1121   GFRAA 6 (L) 10/26/2019 1032    Renal function: Estimated Creatinine Clearance: 11.4 mL/min (A) (by C-G formula based on SCr of  9.4 mg/dL (H)).  A/P: Hypertension diagnosed at the same time as he started dialysis per patient.  Currently with poor control on current 3 medications regimen (amlodipine, hydralazine and losartan). Non-Adherence to clonidine and labetolol noted.   - Plan to continue current regimen plus  - Add clonidine 0.1mg  BID then assess tolerability.  Gradual reduction of blood pressure  and tolerability discussed.  Patient verbalized understanding of current plan.    New complaint of vomiting blood - deferred to Dr. Ardyth Harps  Results reviewed and written information provided.    Written patient instructions provided. Patient verbalized understanding of treatment plan.  Total time in face to face counseling 27 minutes.    Follow-up:  Pharmacist 2-3 weeks from today - date to be determined by additional work-up and treatment plan.  PCP clinic visit - today with Dr. Ardyth Harps.  Patient seen with Adam Phenix, PharmD, PGY-1 Pharmacy Resident.

## 2022-12-03 NOTE — Assessment & Plan Note (Signed)
Reports 5 episodes of vomiting bright red blood since last night. Recent EGD on 7/3 showed gastritis and gastric erosion. Reports taking Pantoprazole BID. Concern for gastric ulcer vs acute GI bleed. -Sent over to ED for further evaluation.

## 2022-12-03 NOTE — Progress Notes (Signed)
   FMTS Attending Admission Note: Terisa Starr, MD   For questions about this patient, please use [email or web address] to page the family medicine resident on call. Pager number [phone].    I  have personally seen and examined this patient, reviewed their chart. I have discussed this patient with the resident.  37 yo with history of Alport syndrome, ESRD on HD, hearing impairment and difficult to control HTN presenting with hematemesis and hematochezia. Reports this started a few days with nausea, vomiting, and then bright red blood when vomiting. He has also had several toilet bowels full of BRBPR. Denies abdominal pain, NSAID use, BG/Goodie use. Does use cannabis. Denies abdominal pain, sick contacts, new medications.   CXR, EKG, Labs reviewed. Hgb is similar to prior.   PMH, PSH, Social history reviewed.  Recent EGD in early July reviewed.  This showed gastritis and duodenitis   Vitals:   12/03/22 1500 12/03/22 1557  BP:    Pulse: (!) 107   Resp: (!) 21   Temp:  98.2 F (36.8 C)  SpO2: 92%    In clinic this AM, patient pleasant and talkative In ED, had significant pain with removal of IV  Talkative, appropriate, moving all extremities + hearing aids in place Poor dentition RUE with fistula with aneurysm R chest wall with catheter Tachycardic, regular, murmur at LLSB Lungs clear Abdomen soft, nontender (minimal tenderness this AM in RUQ), non distended   Gastrointestinal bleeding, differential includes Mallory Weiss tear, gastritis, AVM (has ESRD). Episodes of vomiting may have been due to cannabis vs. Acute GI viral infection.  No significant pain suggestive of ulcer. Trend CBC, monitor symptoms, PPI ordered. Asked nursing to place another IV. Type and hold ordered. GI consulted--we appreciate their consult and care. Monitoring on telemetry overnight. Hemoglobin stable but will trend. Send iron studies Headache, likely due to missed BP pills. Will restart. Could also consider SDH as  cause of vomiting although less likely with diarrhea. Will obtain imaging if persists or not improved.  Markedly elevated blood pressure--- outpatient readings similar. Can be difficult to control BP as has significant symptoms when lower  ESRD, consult Nephrology, due for HD in AM.  Prolonged QT- Reglan for nausea, EKG repeat ordered.  Will sign resident note as able.

## 2022-12-03 NOTE — Assessment & Plan Note (Signed)
2/2  acute blood loss v chronic disease.  - Iron studies: Fe, TIBC, Ferritin - CBC AM  - low threshold to recheck earlier if begins having episodes of bleeding  - transfusion <7

## 2022-12-03 NOTE — Consult Note (Signed)
Consultation  Referring Provider:  [facility]  Primary Care Physician:  [name], DO Primary Gastroenterologist:  Dr. Myrtie Neither       Reason for Consultation:     Hematemesis  LOS: 0 days          HPI:   [name] is a 37 y.o. male with past medical history significant for Alport Syndrome, ESRD on HD (TTS), HFmrEF, HTN presenting with hematemesis and hematochezia.   Recently underwent EGD with Dr. Meridee Score for dysphagia 11/12/22 showed gastritis, duodenitis.  Biopsies possibly consistent with intestinal metaplasia.  Patient states yesterday he began having severe heartburn which triggered vomiting.  Vomit was initially fluid at first which then progressed to bilateral abdomen progressed to complete loss of bright red blood.  Last episode of vomiting was 5 AM this morning.  He also reports intermittent hematochezia ongoing since early July.  He does have a history of hemorrhoids based on his flex sigmoidoscopy.  04/2022 which was done.  The same reason.  Denies abdominal pain.  Denies NSAID use.  Upon arrival  Hgb 7.4, MCV 99.6 (baseline appears 7-8) BUN 41, creatinine 9.40, total bili 0.2 Lipase 88 Hypertensive with blood pressure 208/117.  Heart rate 106  PREVIOUS GI WORKUP--------------------------------------------------  EGD 11/12/22 for dysphagia - No gross lesions in the entire esophagus. Biopsied. Dilated ( no mucosal wrenting noted) .  - Z- line irregular, 41 cm from the incisors.  - Granular, scalloped and texture changed mucosa just below the GEJxn within cardia. Biopsied (normal pulmonary mucosa with intestinal metaplasia) - 3 cm hiatal hernia.  - Gastritis. Biopsied.  Negative H. pylori - Duodenitis and congestion. Biopsied.  Negative H. pylori   EGD 06/06/22 for epigastric pain - Z- line regular, 38 cm from the incisors.  - Normal esophagus.  - Erosive gastritis. Biopsied. - Erosive duodenitis. Biopsied.  Flex sig 04/2022 for hematochezia - Preparation of  the colon was fair.  - Hemorrhoids found on perianal exam. - Non- bleeding external and internal hemorrhoids.  - The examination was otherwise normal.  - No specimens collected.  Past Medical History:  Diagnosis Date   Bipolar 1 disorder (HCC)    Depression    ESRD on hemodialysis (HCC)    HD at [facility] on [facility] schedule   GERD (gastroesophageal reflux disease)    Hearing difficulty of left ear    75% hearing   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   Hypertension    Low blood sugar     Surgical History:  He  has a past surgical history that includes Appendectomy; spinal tap; Wisdom tooth extraction; AV fistula placement (Left, 03/03/2019); Ligation of arteriovenous  fistula (Left, 03/06/2019); Bascilic vein transposition (Right, 04/12/2019); Spine surgery; Colonoscopy with propofol (N/A, 10/14/2021); biopsy (10/14/2021); Flexible sigmoidoscopy (N/A, 04/14/2022); Esophagogastroduodenoscopy (egd) with propofol (N/A, 06/06/2022); biopsy (06/06/2022); A/V Fistulagram (Right, 10/27/2022); PERIPHERAL VASCULAR BALLOON ANGIOPLASTY (10/27/2022); Fistula superficialization (Right, 11/07/2022); Insertion of dialysis catheter (N/A, 11/07/2022); Esophagogastroduodenoscopy (N/A, 11/12/2022); biopsy (11/12/2022); and Savory dilation (N/A, 11/12/2022). Family History:  His family history includes Asthma in his son; Diabetes in his mother; Hearing loss in his father; Heart disease in his maternal grandmother; Hypertension in his mother and sister; Kidney failure in his mother; Stroke in his maternal grandfather. Social History:   reports that he has been smoking cigarettes. He has never used smokeless tobacco. He reports that he does not currently use alcohol. He reports current drug use. Drug: Marijuana.  Prior  to Admission medications   Medication Sig Start Date End Date Taking? Authorizing Provider  acetaminophen (TYLENOL) 325 MG tablet Take 2 tablets (650 mg total) by mouth every 6 (six) hours as needed for mild pain  (or Fever >/= 101). 04/14/22   Lockie Mola, MD  amLODipine (NORVASC) 10 MG tablet TAKE 1 TABLET BY MOUTH EVERY DAY 04/10/22   Sabino Dick, DO  calcium acetate (PHOSLO) 667 MG capsule Take 1 capsule (667 mg total) by mouth 3 (three) times daily with meals. 01/04/22   Vonna Drafts, MD  cloNIDine (CATAPRES) 0.1 MG tablet Take 1 tablet (0.1 mg total) by mouth 2 (two) times daily. 12/03/22   McDiarmid, Leighton Roach, MD  divalproex (DEPAKOTE ER) 500 MG 24 hr tablet Take 1 tablet (500 mg total) by mouth at bedtime. 08/05/22   Sabino Dick, DO  ferric citrate (AURYXIA) 1 GM 210 MG(Fe) tablet Take 210 mg by mouth 3 (three) times daily with meals. 07/26/19   [provider]  hydrALAZINE (APRESOLINE) 100 MG tablet Take 1 tablet (100 mg total) by mouth 3 (three) times daily. 05/20/22   Gaston Islam., NP  labetalol (NORMODYNE) 100 MG tablet Take 1 tablet (100 mg total) by mouth 2 (two) times daily. Patient not taking: Reported on 12/03/2022 11/14/22   Graceann Congress, PA-C  losartan (COZAAR) 100 MG tablet Take 1 tablet (100 mg total) by mouth daily. 11/15/22   Baglia, Corrina, PA-C  pantoprazole (PROTONIX) 40 MG tablet TAKE 1 TABLET BY MOUTH EVERY DAY Patient taking differently: Take 40 mg by mouth 2 (two) times daily. 09/17/22   Sabino Dick, DO  sucralfate (CARAFATE) 1 g tablet Take 1 tablet (1 g total) by mouth 2 (two) times daily. Patient not taking: Reported on 12/03/2022 11/14/22   Graceann Congress, PA-C  triamcinolone ointment (KENALOG) 0.1 % Apply to scrotum twice daily for itching. Do not use for more than 2 weeks continuously 05/30/22   Sabino Dick, DO    Current Facility-Administered Medications  Medication Dose Route Frequency Provider Last Rate Last Admin   0.9 %  sodium chloride infusion  250 mL Intravenous PRN Chuck Hint, MD       acetaminophen (TYLENOL) tablet 650 mg  650 mg Oral Q6H PRN Fortunato Curling, DO       Or   acetaminophen (TYLENOL) suppository 650  mg  650 mg Rectal Q6H PRN Fortunato Curling, DO       amLODipine (NORVASC) tablet 10 mg  10 mg Oral Daily Gomes, Adriana, DO       capsaicin (ZOSTRIX) 0.025 % cream   Topical BID Westley Chandler, MD       cloNIDine (CATAPRES) tablet 0.1 mg  0.1 mg Oral BID Fortunato Curling, DO       divalproex (DEPAKOTE ER) 24 hr tablet 500 mg  500 mg Oral QHS Fatima Blank, Adriana, DO       hydrALAZINE (APRESOLINE) tablet 100 mg  100 mg Oral TID Fortunato Curling, DO       labetalol (NORMODYNE) tablet 100 mg  100 mg Oral BID Gomes, Adriana, DO       losartan (COZAAR) tablet 100 mg  100 mg Oral Daily Fatima Blank, Adriana, DO       metoCLOPramide (REGLAN) injection 10 mg  10 mg Intravenous Once Fortunato Curling, DO       metoCLOPramide (REGLAN) tablet 10 mg  10 mg Oral Q6H PRN Westley Chandler, MD       pantoprazole (PROTONIX) EC tablet 40  mg  40 mg Oral Daily Westley Chandler, MD       sodium chloride flush (NS) 0.9 % injection 3 mL  3 mL Intravenous Q12H Chuck Hint, MD       Current Outpatient Medications  Medication Sig Dispense Refill   acetaminophen (TYLENOL) 325 MG tablet Take 2 tablets (650 mg total) by mouth every 6 (six) hours as needed for mild pain (or Fever >/= 101).     amLODipine (NORVASC) 10 MG tablet TAKE 1 TABLET BY MOUTH EVERY DAY 90 tablet 3   calcium acetate (PHOSLO) 667 MG capsule Take 1 capsule (667 mg total) by mouth 3 (three) times daily with meals. 100 capsule 0   cloNIDine (CATAPRES) 0.1 MG tablet Take 1 tablet (0.1 mg total) by mouth 2 (two) times daily. 60 tablet 11   divalproex (DEPAKOTE ER) 500 MG 24 hr tablet Take 1 tablet (500 mg total) by mouth at bedtime. 30 tablet 2   ferric citrate (AURYXIA) 1 GM 210 MG(Fe) tablet Take 210 mg by mouth 3 (three) times daily with meals.     hydrALAZINE (APRESOLINE) 100 MG tablet Take 1 tablet (100 mg total) by mouth 3 (three) times daily. 270 tablet 1   labetalol (NORMODYNE) 100 MG tablet Take 1 tablet (100 mg total) by mouth 2 (two) times daily. (Patient  not taking: Reported on 12/03/2022) 60 tablet 1   losartan (COZAAR) 100 MG tablet Take 1 tablet (100 mg total) by mouth daily. 30 tablet 1   pantoprazole (PROTONIX) 40 MG tablet TAKE 1 TABLET BY MOUTH EVERY DAY (Patient taking differently: Take 40 mg by mouth 2 (two) times daily.) 90 tablet 1   sucralfate (CARAFATE) 1 g tablet Take 1 tablet (1 g total) by mouth 2 (two) times daily. (Patient not taking: Reported on 12/03/2022) 60 tablet 1   triamcinolone ointment (KENALOG) 0.1 % Apply to scrotum twice daily for itching. Do not use for more than 2 weeks continuously 30 g 1    Allergies as of 12/03/2022 - Review Complete 12/03/2022  Allergen Reaction Noted   Zestril [lisinopril] Swelling 06/13/2021   Nsaids Other (See Comments) 05/20/2014   Risperdal [risperidone] Other (See Comments) 06/13/2021   Carvedilol Other (See Comments) 10/27/2022    Review of Systems  Constitutional:  Negative for chills, fever and weight loss.  HENT:  Negative for hearing loss and tinnitus.   Eyes:  Negative for blurred vision and double vision.  Respiratory:  Negative for cough and hemoptysis.   Cardiovascular:  Negative for chest pain and palpitations.  Gastrointestinal:  Positive for abdominal pain, blood in stool, heartburn, nausea and vomiting. Negative for constipation, diarrhea and melena.  Genitourinary:  Negative for dysuria and urgency.  Musculoskeletal:  Negative for myalgias and neck pain.  Skin:  Negative for itching and rash.  Neurological:  Negative for seizures and loss of consciousness.  Psychiatric/Behavioral:  Negative for depression and suicidal ideas.        Physical Exam:  Vital signs in last 24 hours: Temp:  [98.2 F (36.8 C)-98.4 F (36.9 C)] 98.2 F (36.8 C) (07/24 1557) Pulse Rate:  [98-109] 107 (07/24 1500) Resp:  [13-22] 21 (07/24 1500) BP: (179-208)/(104-124) 208/117 (07/24 1605) SpO2:  [92 %-100 %] 92 % (07/24 1500) Weight:  [77.1 kg-77.2 kg] 77.1 kg (07/24 1120)   Last BM  recorded by nurses in past 5 days No data recorded  Physical Exam Constitutional:      Appearance: He is ill-appearing.  HENT:  Head: Normocephalic and atraumatic.     Nose: Nose normal. No congestion.  Eyes:     General: No scleral icterus.    Comments: Conjunctival pallor  Cardiovascular:     Rate and Rhythm: Regular rhythm. Tachycardia present.  Pulmonary:     Effort: Pulmonary effort is normal.  Abdominal:     General: Abdomen is flat. Bowel sounds are normal. There is no distension.     Palpations: Abdomen is soft. There is no mass.     Tenderness: There is no abdominal tenderness. There is no guarding or rebound.     Hernia: No hernia is present.  Musculoskeletal:        General: No swelling. Normal range of motion.     Cervical back: Normal range of motion and neck supple.  Skin:    General: Skin is warm and dry.     Coloration: Skin is not jaundiced.  Neurological:     General: No focal deficit present.     Mental Status: [name] and oriented to person, place, and time.  Psychiatric:        Mood and Affect: Mood normal.        Behavior: Behavior normal.        Thought Content: Thought content normal.        Judgment: Judgment normal.      LAB RESULTS: Recent Labs    12/03/22 1121  WBC 8.4  HGB 7.4*  HCT 23.2*  PLT 243   BMET Recent Labs    12/03/22 1121  NA 137  K 4.3  CL 94*  CO2 26  GLUCOSE 80  BUN 41*  CREATININE 9.40*  CALCIUM 9.4   LFT Recent Labs    12/03/22 1121  PROT 6.3*  ALBUMIN 3.5  AST 28  ALT 21  ALKPHOS 66  BILITOT 0.2*   PT/INR No results for input(s): "LABPROT", "INR" in the last 72 hours.  STUDIES: DG Chest Portable 1 View  Result Date: 12/03/2022 CLINICAL DATA:  Hypertension, evaluate for edema, dialysis patient EXAM: PORTABLE CHEST 1 VIEW COMPARISON:  11/18/2022 FINDINGS: Right IJ tunneled HD catheter mid SVC level. Cardiomegaly noted with vascular prominence and diffuse mild increased interstitial changes  throughout both lungs compatible with early interstitial edema pattern. Minor associated basilar atelectasis. No large effusion or pneumothorax. Trachea midline. Nonobstructive bowel gas pattern. No osseous abnormality. IMPRESSION: 1. Cardiomegaly with early interstitial edema pattern. 2. Bibasilar atelectasis. Electronically Signed   By: Judie Petit.  Shick M.D.   On: 12/03/2022 15:21      Impression    37 year old male history of ESRD on HD, chronic diastolic CHF, presented for evaluation of hematemesis.  Hemoglobin stable.  History of gastritis and duodenitis on EGD showed negative H. pylori and showed intestinal metaplasia at the [facility] 11/12/2022. Hypertensive and tachycardic Hgb 7.4, MCV 99.6 (baseline appears 7-8) BUN 41, creatinine 9.40, total bili 0.2 Lipase 88 Hypertensive with blood pressure 208/117.  Heart rate 106 appears baseline for patient on review of previous hospitalizations. Anemia appears baseline for patient and is likely multifactorial with normal MCV and history of ESRD on HD. Suspect patient's severe retching with vomiting could have resulted in mallory weiss tear causing his hematemesis.   Hematochezia Hemorrhoids on flex sig 04/2022. Intermittent and in stool and on tissue paper. Would benefit from full colonoscopy at some point inpatient versus outpatient. Hematochezia possible hemorrhoidal.  CHF ESRD on HD   Plan   -Continue daily CBC and transfuse as needed to maintain  HGB > 7  - Possible EGD pending anesthesia availability Friday. Defer further to Dr. Milas Hock recommendations. Currently hemodynamically stable with no further bleeding. Continue to monitor closely. - continue protonix 40mg  PO BID - would recommend clear liquids in case of recurrence  Thank you for your kind consultation, we will continue to follow.  Scott Fix Leanna Sato  12/03/2022, 5:07 PM

## 2022-12-03 NOTE — Assessment & Plan Note (Deleted)
ESRD on HD (TTS). Last session reported on Tuesday.

## 2022-12-03 NOTE — Assessment & Plan Note (Signed)
Hypertension diagnosed at the same time as he started dialysis per patient.  Currently with poor control on current 3 medications regimen (amlodipine, hydralazine and losartan). Non-Adherence to clonidine and labetolol noted.   - Plan to continue current regimen plus  - Add clonidine 0.1mg  BID then assess tolerability.  Gradual reduction of blood pressure  and tolerability discussed.  Patient verbalized understanding of current plan.

## 2022-12-03 NOTE — Progress Notes (Signed)
Bp elevated check flowsheets. Notified Attending. Will ctm

## 2022-12-03 NOTE — Assessment & Plan Note (Signed)
Last session yesterday (7/23). Reports he has not missed a session.

## 2022-12-03 NOTE — ED Triage Notes (Addendum)
Pt to ED from PCP, dialysis pt, last treatment yesterday. Pt here today c/o hematemesis and blood in stool since last night. Pt also c/o generalized weakness.

## 2022-12-03 NOTE — Progress Notes (Signed)
FMTS Brief Progress Note  S: Went to see patient at bedside with Dr. Georg Ruddle.  Patient is feeling much better.  States that when he got a discharge or out of his car he took a migraine medication and now feels much better.  I asked which medicine this is and is not sure it was an over-the-counter migraine medicine.  Denies any further episodes of bleeding per rectum or hematemesis.  He is occasionally having burping but denies any blood that he is seen.  No abdominal pain currently.   O: BP (!) 191/108 (BP Location: Left Arm)   Pulse (!) 103   Temp 98.4 F (36.9 C) (Oral)   Resp 17   Ht 5\' 9"  (1.753 m)   Wt 77.1 kg   SpO2 98%   BMI 25.10 kg/m    General: Well appearing, NAD, awake, alert, responsive to questions Head: Normocephalic atraumatic CV: Regular rate and rhythm no murmurs rubs or gallops Respiratory: Clear to ausculation bilaterally, chest rises symmetrically,  no increased work of breathing Abdomen: Soft, non-tender, non-distended, normoactive bowel sounds  Extremities: Moves upper and lower extremities freely  A/P: Acute GI bleed Abdominal exam benign and no further episodes of bleeding per patient.  Message to RN to ensure patient has 2 large-bore IVs in place.  Discussed with him to please not take any over-the-counter medications as they likely are aspirin containing.  Discussed recommendations per GI clear liquid diet however patient prefers to stay on soft diet for now.  I think this is okay with this low blood sugars as long as he is tolerating this but discussed to let us know if anything changes in terms of more episodes of bleeding.  Headache Much improved after patient taken his over-the-counter medication from his car.  Suspect that this is likely Excedrin.  Also likely component of blood pressure.  We have talked to nursing to administer oral blood pressure medications.  Difficult to control  -discussed with patient to please not take any medications containing  aspirin/home medications unless prescribed here in the hospital.  Patient aware of this.  ESRD Home phosphate binder reordered as patient was requesting   - Orders reviewed. Labs for AM ordered, which was adjusted as needed.    Levin Erp, MD 12/03/2022, 8:30 PM PGY-3, [facility] Night Resident  Please page [phone] with questions.

## 2022-12-03 NOTE — Assessment & Plan Note (Signed)
Endorses daily BRB per rectum x 2 weeks. Symptomatic with lightheadedness and fatigue. Hgb 7.6 on 7/15. Concern for GI bleed. -Sent over to the ED for further evaluation

## 2022-12-03 NOTE — Progress Notes (Signed)
Secure chat sent to nurse and Dr. Laroy Apple. Notified that patient has 1st IV 22g and 2nd PIV 20g. Also, educated on importance of vein preservation d/t restricted R arm and limited vasculature. No infusions/transfusions ordered at this time. Also, has a R chest DL HD catheter. Tomasita Morrow, RN VAST

## 2022-12-03 NOTE — Progress Notes (Signed)
Blood sugar 71. Notified care team

## 2022-12-03 NOTE — Progress Notes (Incomplete)
Pt reports they take calcium acetate everytime they eat because they are ESRD and is requesting it now because they are eating.

## 2022-12-03 NOTE — Patient Instructions (Signed)
It was nice to see you today!  Medication Changes: Begin clonidine 0.1 mg twice a day  Continue amlodipine 10 mg daily  Continue losartan 100 mg daily  Continue hydralazine 100 mg three times a day   Monitor blood pressure at home daily and keep a log (on your phone or piece of paper) to bring with you to your next visit. Write down date, time, blood pressure and pulse.  Keep up the good work with diet and exercise. Aim for a diet full of vegetables, fruit and lean meats (chicken, [place], fish). Try to limit salt intake by eating fresh or frozen vegetables (instead of canned), rinse canned vegetables prior to cooking and do not add any additional salt to meals.

## 2022-12-03 NOTE — Hospital Course (Addendum)
[name] is a 36 y.o.male with a history of Alport Syndrome, ESRD on HD (TTS), resistant  who was admitted to the [facility] Teaching Service at [facility] for hematemesis and hematochezia. His hospital course is detailed below:  Hematemesis  Hematochezia Patient presented from [facility] office due to reported hematochezia and hematemesis. In the ED, his vital signs were stable and Hgb stable at 7.4 with no electrolyte abnormalities.  He was started on PO Protonix 40 mg and IV Reglan 10 mg. GI was consulted and reccommended EGD and increasing Protonix to twice daily. EGD revealed mildly severe esophagitis with no bleeding, 3 cm hiatal hernia, and erosive gastropathy with scant oozing which was biopsied and is most likely the source of his hematemesis.  GI recommended Protonix 40 mg PO BID for 4 weeks with outpatient follow-up.  His hemoglobin was trended and remained stable with last hemoglobin 7.4 prior to discharge.  Resistant Hypertension Patient presented with elevated SBP 190-210s which remain chronically elevated outpatient.  He was started on his home medications of Amlodipine 10 mg daily, Clonidine 0.1 mg daily, Hydralazine 100 mg TID, Labetalol 100 mg BID, and Losartan 100 mg daily.  Patient reported poor side effects to clonidine and refused medication.  His labetalol was increased to 100 mg 3 times daily prior to discharge.  Upon discharge his blood pressure remains elevated with last reading of 182/102, but patient denied any headache, blurry vision, and felt ready to go home.  Patient remains stable at his home baseline blood pressures upon discharge -recommended continuing clonidine outpatient for further control of blood pressure.  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  He was given 1 dose of IV iron prior to  discharge.  Headache On admission and throughout his hospitalization patient complains of severe headaches.  Home medication include Imitrex and Excedrin Migraine.  Imitrex was discontinued due to severe hypertension.  PO steroids, Reglan and Benadryl were attempted, with some relief, but his symptoms reoccur frequently.  Neurology was curb sided while he was hospitalized and recommended outpatient follow-up with migraine clinic at [facility] neurology for further headache workup and potential migraine prophylaxis.  Referral placed at discharge.   PCP Follow-up Recommendations: Recommend that patient completes 4 weeks of Protonix. Advise patient to avoid Triptans and Excedrin for migraines in the setting of ESRD and uncontrolled HTN. Consider migraine prophylaxis We recommend he takes all prescribed BP medications upon discharge due to this resistant HTN. Discuss the importance of taking his medications and make modifications as they seem appropriate. Seems to have a mixed picture of Iron Deficiency Anemia and Anemia of Chronic Disease, consider iron supplementation outpatient. Consider hydrocortisone perianal cream for hemorrhoids to help avoid episodes of hematochezia.

## 2022-12-03 NOTE — Assessment & Plan Note (Addendum)
Patient was agitated upon admission due to severe headache, likely due to dehydration from vomiting and bloody bowel movements or his elevated BP. Previously on Imitrex for migraines.  - PO Reglan to manage his headaches

## 2022-12-03 NOTE — Patient Instructions (Signed)
It was wonderful to see you today! Thank you for choosing [facility].   Please bring ALL of your medications with you to every visit.   We are sending you over the the Emergency Department for further evaluation of your vomiting blood and blood in your stool. We let the hospital team that you would be coming in for evaluation.  [name], DO Family Medicine

## 2022-12-03 NOTE — Assessment & Plan Note (Addendum)
Likely in the setting of him losing blood through vomiting and his bowel movements. Will rule out iron deficiency anemia with additional blood work.  - Iron studies: Fe, TIBC, Ferritin

## 2022-12-03 NOTE — ED Notes (Addendum)
PT is complaining of a severe headache.When I went in to give his nausea medication and his protonix, I noticed his IV does not pull or flush and the PT was c/o of severe pain in his left arm and I observed a bulge under the skin that was painful to touch so I took the IV out.Physician(Brown,Carina M, MD) was in the room  and meds are being put in orally until we can get IV access

## 2022-12-03 NOTE — Progress Notes (Signed)
    SUBJECTIVE:   CHIEF COMPLAINT / HPI:   Vomiting blood Threw up last night and this morning, total of 5 times. Reports his vomit looks like bright red blood. Sporadic abdominal pain. Still able to eat and drink some. Reports he has been feeling dizzy and lightheaded. Currently has a significant headache.  Bright red blood per rectum Reports he has been pooping blood since he got out of the hospital 2 weeks ago. Occurring daily and feels like it is bright red blood. Reports he has not felt normal since having EGD on 7/3.  ESRD Reports he has not missed a diaylsis session. Last went yesterday. Scheduled for fistula revision at [facility] on 8/21 due to ongoing issues.  PERTINENT  PMH / PSH: ESRD, HTN  OBJECTIVE:   BP 189/104 (BP Location: Left Arm, Patient Position: Sitting, Cuff Size: Large). Pulse 98. Ht 5' 9.8" (1.773 m). Wt 170 lb 3.2 oz (77.2 kg). SpO2 100%. BMI 24.56 kg/m   General: Alert. NAD HEENT: Normocephalic. White sclera. Hearing aids in place. CV: RRR without murmur Pulm: CTAB. Normal WOB on RA. No wheezing Abdomen: Soft, non-distended. +BS. TTP over RUQ and RLQ Ext: Well perfused. Cap refill < 3 seconds Skin: Warm, dry. No rashes noted  ASSESSMENT/PLAN:   Rectal bleeding Endorses daily BRB per rectum x 2 weeks. Symptomatic with lightheadedness and fatigue. Hgb 7.6 on 7/15. Concern for GI bleed. -Sent over to the ED for further evaluation  ESRD on dialysis [facility]) Last session yesterday (7/23). Reports he has not missed a session.  Hematemesis Reports 5 episodes of vomiting bright red blood since last night. Recent EGD on 7/3 showed gastritis and gastric erosion. Reports taking Pantoprazole BID. Concern for gastric ulcer vs acute GI bleed. -Sent over to ED for further evaluation.   Dr. [name], DO [facility]

## 2022-12-03 NOTE — ED Notes (Signed)
ED TO INPATIENT HANDOFF REPORT  ED Nurse Name and Phone #: Rodney Booze [phone]  S Name/Age/Gender [name] 37 y.o. male Room/Bed: 002C/002C  Code Status   Code Status: Full Code  Home/SNF/Other Home Patient oriented to: self, place, time, and situation Is this baseline? Yes   Triage Complete: Triage complete  Chief Complaint GI bleed [K92.2]  Triage Note Pt to ED from PCP, dialysis pt, last treatment yesterday. Pt here today c/o hematemesis and blood in stool since last night. Pt also c/o generalized weakness.    Allergies Allergies  Allergen Reactions   Zestril [Lisinopril] Swelling   Nsaids Other (See Comments)    "Kidney problems "   Risperdal [Risperidone] Other (See Comments)    Unknown reaction    Carvedilol Other (See Comments)    Makes mouth numb and makes blood sugar drop    Level of Care/Admitting Diagnosis ED Disposition     ED Disposition  Admit   Condition  --   Comment  Hospital Area: [facility] [[id number]]  Level of Care: Med-Surg [16]  May place patient in observation at [facility] or [facility] if equivalent level of care is available:: No  Covid Evaluation: Asymptomatic - no recent exposure (last 10 days) testing not required  Diagnosis: GI bleed [130865]  Admitting Physician: Fortunato Curling [[id number]]  Attending Physician: Westley Chandler [[id number]]          B Medical/Surgery History Past Medical History:  Diagnosis Date   Bipolar 1 disorder (HCC)    Depression    ESRD on hemodialysis (HCC)    HD at [facility] on T[facility] schedule   GERD (gastroesophageal reflux disease)    Hearing difficulty of left ear    75% hearing   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   Hypertension    Low blood sugar    Past Surgical History:  Procedure Laterality Date   A/V FISTULAGRAM Right 10/27/2022   Procedure: A/V Fistulagram;  Surgeon: Chuck Hint, MD;  Location: [facility] INVASIVE CV LAB;  Service: Cardiovascular;   Laterality: Right;   APPENDECTOMY     AV FISTULA PLACEMENT Left 03/03/2019   Procedure: ARTERIOVENOUS (AV) FISTULA CREATION LEFT ARM;  Surgeon: Maeola Harman, MD;  Location: [facility] OR;  Service: Vascular;  Laterality: Left;   BASCILIC VEIN TRANSPOSITION Right 04/12/2019   Procedure: RIGHT UPPER EXTREMITY BASCILIC VEIN TRANSPOSITION FIRST STAGE FISTULA;  Surgeon: Chuck Hint, MD;  Location: [facility] OR;  Service: Vascular;  Laterality: Right;   BIOPSY  10/14/2021   Procedure: BIOPSY;  Surgeon: Sherrilyn Rist, MD;  Location: [facility] ENDOSCOPY;  Service: Gastroenterology;;   BIOPSY  06/06/2022   Procedure: BIOPSY;  Surgeon: Napoleon Form, MD;  Location: [facility] ENDOSCOPY;  Service: Gastroenterology;;   BIOPSY  11/12/2022   Procedure: BIOPSY;  Surgeon: Lemar Lofty., MD;  Location: [facility] ENDOSCOPY;  Service: Gastroenterology;;   COLONOSCOPY WITH PROPOFOL N/A 10/14/2021   Procedure: COLONOSCOPY WITH PROPOFOL;  Surgeon: Sherrilyn Rist, MD;  Location: [facility] ENDOSCOPY;  Service: Gastroenterology;  Laterality: N/A;   ESOPHAGOGASTRODUODENOSCOPY N/A 11/12/2022   Procedure: ESOPHAGOGASTRODUODENOSCOPY (EGD);  Surgeon: Lemar Lofty., MD;  Location: [facility] ENDOSCOPY;  Service: Gastroenterology;  Laterality: N/A;   ESOPHAGOGASTRODUODENOSCOPY (EGD) WITH PROPOFOL N/A 06/06/2022   Procedure: ESOPHAGOGASTRODUODENOSCOPY (EGD) WITH PROPOFOL;  Surgeon: Napoleon Form, MD;  Location: [facility] ENDOSCOPY;  Service: Gastroenterology;  Laterality: N/A;   FISTULA SUPERFICIALIZATION Right 11/07/2022   Procedure: PLICATION OF LARGE ANEURYSMS  OF RIGHT ARTERIOVENOUS FISTULA WITH INSERTION OF 7mm INTERPOSITIONAL GORTEX GRAFT;  Surgeon: Chuck Hint, MD;  Location: [facility] OR;  Service: Vascular;  Laterality: Right;   FLEXIBLE SIGMOIDOSCOPY N/A 04/14/2022   Procedure: FLEXIBLE SIGMOIDOSCOPY;  Surgeon: Napoleon Form, MD;  Location: [facility] ENDOSCOPY;  Service: Gastroenterology;  Laterality: N/A;   INSERTION OF  DIALYSIS CATHETER N/A 11/07/2022   Procedure: INSERTION OF RIGHT INTERNAL JUGULAR TUNNELED DIALYSIS CATHETER;  Surgeon: Chuck Hint, MD;  Location: [facility] OR;  Service: Vascular;  Laterality: N/A;   LIGATION OF ARTERIOVENOUS  FISTULA Left 03/06/2019   Procedure: LIGATION OF ARTERIOVENOUS  FISTULA;  Surgeon: Cephus Shelling, MD;  Location: [facility] OR;  Service: Vascular;  Laterality: Left;   PERIPHERAL VASCULAR BALLOON ANGIOPLASTY  10/27/2022   Procedure: PERIPHERAL VASCULAR BALLOON ANGIOPLASTY;  Surgeon: Chuck Hint, MD;  Location: [facility] INVASIVE CV LAB;  Service: Cardiovascular;;  rt upper arm fistula   SAVORY DILATION N/A 11/12/2022   Procedure: SAVORY DILATION;  Surgeon: Meridee Score Netty Starring., MD;  Location: [facility] ENDOSCOPY;  Service: Gastroenterology;  Laterality: N/A;   spinal tap     SPINE SURGERY     related to a spinal infection, unsure of surgery or infection source   WISDOM TOOTH EXTRACTION       A IV Location/Drains/Wounds Patient Lines/Drains/Airways Status     Active Line/Drains/Airways     Name Placement date Placement time Site Days   Fistula / Graft Right Upper arm Arteriovenous fistula 04/12/19  1105  Upper arm  1331   Hemodialysis Catheter Right Internal jugular Double lumen Permanent (Tunneled) 11/07/22  0816  Internal jugular  26            Intake/Output Last 24 hours No intake or output data in the 24 hours ending 12/03/22 1607  Labs/Imaging Results for orders placed or performed during the hospital encounter of 12/03/22 (from the past 48 hour(s))  Comprehensive metabolic panel     Status: Abnormal   Collection Time: 12/03/22 11:21 AM  Result Value Ref Range   Sodium 137 135 - 145 mmol/L   Potassium 4.3 3.5 - 5.1 mmol/L   Chloride 94 (L) 98 - 111 mmol/L   CO2 26 22 - 32 mmol/L   Glucose, Bld 80 70 - 99 mg/dL    Comment: Glucose reference range applies only to samples taken after fasting for at least 8 hours.   BUN 41 (H) 6 - 20 mg/dL    Creatinine, Ser 1.61 (H) 0.61 - 1.24 mg/dL   Calcium 9.4 8.9 - 09.6 mg/dL   Total Protein 6.3 (L) 6.5 - 8.1 g/dL   Albumin 3.5 3.5 - 5.0 g/dL   AST 28 15 - 41 U/L   ALT 21 0 - 44 U/L   Alkaline Phosphatase 66 38 - 126 U/L   Total Bilirubin 0.2 (L) 0.3 - 1.2 mg/dL   GFR, Estimated 7 (L) >60 mL/min    Comment: (NOTE) Calculated using the CKD-EPI Creatinine Equation (2021)    Anion gap 17 (H) 5 - 15    Comment: Performed at [facility], [address] [place], [place] [address]  CBC     Status: Abnormal   Collection Time: 12/03/22 11:21 AM  Result Value Ref Range   WBC 8.4 4.0 - 10.5 K/uL   RBC 2.33 (L) 4.22 - 5.81 MIL/uL   Hemoglobin 7.4 (L) 13.0 - 17.0 g/dL   HCT 98.1 (L) 19.1 - 47.8 %   MCV 99.6 80.0 - 100.0  fL   MCH 31.8 26.0 - 34.0 pg   MCHC 31.9 30.0 - 36.0 g/dL   RDW 02.7 (H) 25.3 - 66.4 %   Platelets 243 150 - 400 K/uL   nRBC 0.4 (H) 0.0 - 0.2 %    Comment: Performed at [facility], [address], [place], [place] [address]  Lipase, blood     Status: Abnormal   Collection Time: 12/03/22 11:21 AM  Result Value Ref Range   Lipase 88 (H) 11 - 51 U/L    Comment: Performed at [facility], [address] [place], [place] [address]  CBG monitoring, ED     Status: None   Collection Time: 12/03/22 12:07 PM  Result Value Ref Range   Glucose-Capillary 71 70 - 99 mg/dL    Comment: Glucose reference range applies only to samples taken after fasting for at least 8 hours.  Type and screen [facility]     Status: None   Collection Time: 12/03/22 12:14 PM  Result Value Ref Range   ABO/RH(D) O POS    Antibody Screen NEG    Sample Expiration      12/06/2022,2359 Performed at [facility] Lab, [address] [place], [place] [address]   Lipase, blood     Status: Abnormal   Collection Time: 12/03/22 12:25 PM  Result Value Ref Range   Lipase 79 (H) 11 - 51 U/L    Comment: Performed at [facility], [address], [place],  [place] [address]  Valproic acid level     Status: Abnormal   Collection Time: 12/03/22 12:25 PM  Result Value Ref Range   Valproic Acid Lvl <10 (L) 50.0 - 100.0 ug/mL    Comment: RESULT CONFIRMED BY MANUAL DILUTION Performed at [facility], [address] [place], [place] [address]    DG Chest Portable 1 View  Result Date: 12/03/2022 CLINICAL DATA:  Hypertension, evaluate for edema, dialysis patient EXAM: PORTABLE CHEST 1 VIEW COMPARISON:  11/18/2022 FINDINGS: Right IJ tunneled HD catheter mid SVC level. Cardiomegaly noted with vascular prominence and diffuse mild increased interstitial changes throughout both lungs compatible with early interstitial edema pattern. Minor associated basilar atelectasis. No large effusion or pneumothorax. Trachea midline. Nonobstructive bowel gas pattern. No osseous abnormality. IMPRESSION: 1. Cardiomegaly with early interstitial edema pattern. 2. Bibasilar atelectasis. Electronically Signed   By: Judie Petit.  Shick M.D.   On: 12/03/2022 15:21    Pending Labs Unresulted Labs (From admission, onward)     Start     Ordered   12/04/22 0500  CBC  Tomorrow morning,   R        12/03/22 1601   12/04/22 0500  Renal function panel  Tomorrow morning,   R        12/03/22 1601            Vitals/Pain Today's Vitals   12/03/22 1430 12/03/22 1445 12/03/22 1500 12/03/22 1557  BP: (!) 179/115     Pulse: (!) 103 (!) 104 (!) 107   Resp: (!) 22 18 (!) 21   Temp:    98.2 F (36.8 C)  TempSrc:    Oral  SpO2: 98% 97% 92%   Weight:      Height:      PainSc:        Isolation Precautions No active isolations  Medications Medications  pantoprazole (PROTONIX) injection 40 mg (40 mg Intravenous Not Given 12/03/22 1555)  metoCLOPramide (REGLAN)  injection 10 mg (10 mg Intravenous Not Given 12/03/22 1544)  amLODipine (NORVASC) tablet 10 mg (has no administration in time range)  cloNIDine (CATAPRES) tablet 0.1 mg (has no administration in time range)  hydrALAZINE (APRESOLINE)  tablet 100 mg (has no administration in time range)  labetalol (NORMODYNE) tablet 100 mg (has no administration in time range)  losartan (COZAAR) tablet 100 mg (has no administration in time range)  divalproex (DEPAKOTE ER) 24 hr tablet 500 mg (has no administration in time range)  acetaminophen (TYLENOL) tablet 650 mg (has no administration in time range)    Or  acetaminophen (TYLENOL) suppository 650 mg (has no administration in time range)  dextrose 50 % solution 50 mL (50 mLs Intravenous Given 12/03/22 1253)  acetaminophen (TYLENOL) tablet 650 mg (650 mg Oral Not Given 12/03/22 1322)    Mobility walks     Focused Assessments Cardiac Assessment Handoff:  Cardiac Rhythm: Normal sinus rhythm Lab Results  Component Value Date   CKTOTAL 414 (H) 11/24/2022   CKMB 2.2 11/08/2022   Lab Results  Component Value Date   DDIMER 0.74 (H) 09/23/2022   Does the Patient currently have chest pain? No   , Renal Assessment Handoff:  Hemodialysis Schedule: Tuesday/Thursday/Saturday Last Hemodialysis date and time: 12/02/2022   Restricted appendage: right arm   R Recommendations: See Admitting Provider Note  Report given to:   Additional Notes:

## 2022-12-03 NOTE — ED Provider Notes (Signed)
EMERGENCY DEPARTMENT AT [facility] Provider Note   CSN: [id number] Arrival date & time: 12/03/22  1100     History  Chief Complaint  Patient presents with   Hematemesis   GI Bleeding    Edward HEMMER is a 37 y.o. male.  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  Reports vomiting large amount of bright red blood last night and 2 times this morning.  Reports it appeared bright red and filled toilet bowl.  He also notes that has been having some maroon-colored stool for some time.  He states he has some generalized malaise, headache, feels that his blood sugar is low.  Did not eat breakfast this morning.  No reported no reported NSAIDs, BC Goody powders or blood thinners.        Home Medications Prior to Admission medications   Medication Sig Start Date End Date Taking? Authorizing Provider  acetaminophen (TYLENOL) 325 MG tablet Take 2 tablets (650 mg total) by mouth every 6 (six) hours as needed for mild pain (or Fever >/= 101). 04/14/22   Lockie Mola, MD  amLODipine (NORVASC) 10 MG tablet TAKE 1 TABLET BY MOUTH EVERY DAY 04/10/22   Sabino Dick, DO  calcium acetate (PHOSLO) 667 MG capsule Take 1 capsule (667 mg total) by mouth 3 (three) times daily with meals. 01/04/22   Vonna Drafts, MD  cloNIDine (CATAPRES) 0.1 MG tablet Take 1 tablet (0.1 mg total) by mouth 2 (two) times daily. 12/03/22   McDiarmid, Leighton Roach, MD  divalproex (DEPAKOTE ER) 500 MG 24 hr tablet Take 1 tablet (500 mg total) by mouth at bedtime. 08/05/22   Sabino Dick, DO  ferric citrate (AURYXIA) 1 GM 210 MG(Fe) tablet Take 210 mg by mouth 3 (three) times daily with meals. 07/26/19   [provider]  hydrALAZINE (APRESOLINE) 100 MG tablet Take 1 tablet (100 mg total) by mouth 3 (three) times daily. 05/20/22   Gaston Islam., NP  labetalol (NORMODYNE) 100 MG tablet  Take 1 tablet (100 mg total) by mouth 2 (two) times daily. Patient not taking: Reported on 12/03/2022 11/14/22   Graceann Congress, PA-C  losartan (COZAAR) 100 MG tablet Take 1 tablet (100 mg total) by mouth daily. 11/15/22   Baglia, Corrina, PA-C  pantoprazole (PROTONIX) 40 MG tablet TAKE 1 TABLET BY MOUTH EVERY DAY Patient taking differently: Take 40 mg by mouth 2 (two) times daily. 09/17/22   Sabino Dick, DO  sucralfate (CARAFATE) 1 g tablet Take 1 tablet (1 g total) by mouth 2 (two) times daily. Patient not taking: Reported on 12/03/2022 11/14/22   Graceann Congress, PA-C  triamcinolone ointment (KENALOG) 0.1 % Apply to scrotum twice daily for itching. Do not use for more than 2 weeks continuously 05/30/22   Sabino Dick, DO      Allergies    Zestril [lisinopril], Nsaids, Risperdal [risperidone], and Carvedilol    Review of Systems   Review of Systems  Physical Exam Updated Vital Signs BP (!) 179/115   Pulse (!) 107   Temp 98.4 F (36.9 C) (Oral)   Resp (!) 21   Ht 5\' 9"  (1.753 m)   Wt 77.1 kg   SpO2 92%   BMI 25.10 kg/m  Physical Exam Vitals and nursing note reviewed.  Constitutional:      General: He is not in acute distress.    Appearance: He is not toxic-appearing.  HENT:     Head: Normocephalic.     Nose: Nose normal.     Mouth/Throat:     Mouth: Mucous membranes are moist.  Eyes:     Conjunctiva/sclera: Conjunctivae normal.  Cardiovascular:     Rate and Rhythm: Normal rate and regular rhythm.  Pulmonary:     Effort: Pulmonary effort is normal.  Abdominal:     General: Abdomen is flat. There is no distension.     Tenderness: There is no abdominal tenderness. There is no guarding or rebound.  Musculoskeletal:        General: Normal range of motion.  Skin:    General: Skin is warm and dry.     Capillary Refill: Capillary refill takes less than 2 seconds.  Neurological:     Mental Status: [name] and oriented to person, place, and time.  Psychiatric:         Mood and Affect: Mood normal.        Behavior: Behavior normal.     ED Results / Procedures / Treatments   Labs (all labs ordered are listed, but only abnormal results are displayed) Labs Reviewed  COMPREHENSIVE METABOLIC PANEL - Abnormal; Notable for the following components:      Result Value   Chloride 94 (*)    BUN 41 (*)    Creatinine, Ser 9.40 (*)    Total Protein 6.3 (*)    Total Bilirubin 0.2 (*)    GFR, Estimated 7 (*)    Anion gap 17 (*)    All other components within normal limits  CBC - Abnormal; Notable for the following components:   RBC 2.33 (*)    Hemoglobin 7.4 (*)    HCT 23.2 (*)    RDW 15.9 (*)    nRBC 0.4 (*)    All other components within normal limits  LIPASE, BLOOD - Abnormal; Notable for the following components:   Lipase 79 (*)    All other components within normal limits  LIPASE, BLOOD - Abnormal; Notable for the following components:   Lipase 88 (*)    All other components within normal limits  VALPROIC ACID LEVEL  CBG MONITORING, ED  TYPE AND SCREEN    EKG None  Radiology No results found.  Procedures Procedures    Medications Ordered in ED Medications  pantoprazole (PROTONIX) injection 40 mg (has no administration in time range)  metoCLOPramide (REGLAN) injection 10 mg (has no administration in time range)  dextrose 50 % solution 50 mL (50 mLs Intravenous Given 12/03/22 1253)  acetaminophen (TYLENOL) tablet 650 mg (650 mg Oral Not Given 12/03/22 1322)    ED Course/ Medical Decision Making/ A&P Clinical Course as of 12/03/22 1508  Wed Dec 03, 2022  1228 Glucose-Capillary: 56 Low; did not eat breakfast this morning. Will keep NPO given reported hematemasis. D50 ordered.  [TY]  1304 Hemoglobin(!): 7.4 7.6 Nine days ago per review.  [TY]  1403 Had a EGD [TY]  1404 EGD 11/12/22: "IMPRESSION: Chronically coarsened interstitial markings bilaterally with bibasilar scarring and/or atelectasis. No superimposed  airspace consolidation." [TY]  1447 Spoke with Cira Servant with GI.  No anesthesia, and no indication for emergent endoscopy at this time, but will follow and think observation in hospital is appropriate. [TY]  1455 Patient and family updated on results.  They are agreeable for admission at this time. [TY]  1506 Case discussed with admitting will seen and patient. [TY]    Clinical Course User Index [TY] Estanislado Pandy  J, DO                             Medical Decision Making 37 year old male presenting emergency department for hematemesis and blood in stool.  He is ESRD on dialysis, history of Barron Schmid, history of GI bleed.  He is hypertensive, heart rate slightly elevated no fever maintaining oxygen saturation on room air.  He has a soft benign abdomen.  Laboratory workup today with seemingly stable anemia however is borderline at 7.4 in terms of transfusion.  He has no fever or leukocytosis to suggest systemic infection.  No significant metabolic derangements.  Has renal function consistent with ESRD.  Lipase mildly elevated 88, but appears has been elevated prior.  Blood sugar borderline, given D50 to keep NPO.  IV Protonix ordered. Reglan for nausea and headache. Will discuss case with GI; dispo per their recommendations.See ED course for final mdm and dispositon.   Amount and/or Complexity of Data Reviewed Labs: ordered. Decision-making details documented in ED Course. Radiology: ordered.  Risk OTC drugs. Prescription drug management. Decision regarding hospitalization.         Final Clinical Impression(s) / ED Diagnoses Final diagnoses:  Acute GI bleeding    Rx / DC Orders ED Discharge Orders     None         Coral Spikes, DO 12/03/22 1508

## 2022-12-03 NOTE — Assessment & Plan Note (Addendum)
Patient was agitated upon admission due to severe headache, likely due to dehydration from vomiting, bloody bowel movements, elevated BP, and h/o migraines. Previously on Imitrex for migraines but was Dc'd at pt's request.  - PO Reglan to manage his headaches - Tylenol  - Once IV established consider IV compazine

## 2022-12-03 NOTE — ED Notes (Signed)
Checked patient cbg it was 63 notified RN of blood sugar patient is resting with call bell in reach

## 2022-12-03 NOTE — Assessment & Plan Note (Deleted)
5 episodes of vomiting overnight, large in volume and streaked with blood. Last episode at 5 am today. Associates with dizziness, lightheadedness, and severe headache. - PO Reglan for nausea and headache - Topical capsaicin

## 2022-12-03 NOTE — Assessment & Plan Note (Addendum)
Patient has a history of hematochezia for the past 2 weeks with large volumes and streaks of blood. Last BM yesterday evening. 5 episodes of vomiting overnight, large in volume and streaked with blood. Last episode at 5 am today. Associates with dizziness, lightheadedness, and severe headache.Hgb stable in the ED at 7.4. Last EGD 11/12/22 which showed gastritis, gastric erosion, 3 cm hiatal hernia, and duodenitis. - NPO at [facility] for EGD 7/26 - PO Reglan for nausea and headache - Topical capsaicin for cannabis hyperemesis, but patient has not been using - EKG pending - AM CBC/RFP - GI on board, appreciate recs - Strict I&Os

## 2022-12-03 NOTE — Assessment & Plan Note (Addendum)
Patient has a history of hematochezia for the past 2 weeks with large volumes and streaks of blood. Last BM yesterday evening. 5 episodes of vomiting overnight, large in volume and streaked with blood. Last episode at 5 am today. Associates with dizziness, lightheadedness, and severe headache.Hgb stable in the ED at 7.4. Last EGD 11/12/22 which showed gastritis, gastric erosion, 3 cm hiatal hernia, and duodenitis. - Admit to FMTS, attending Dr. Manson Passey - Med-Surg, Vital signs per floor - Soft diet  - VTE prophylaxis: SCDs - PO Reglan for nausea and headache - Topical capsaicin - EKG pending - AM CBC/RFP - GI on board, will consider EGD vs colonscopy

## 2022-12-04 DIAGNOSIS — I5042 Chronic combined systolic (congestive) and diastolic (congestive) heart failure: Secondary | ICD-10-CM | POA: Diagnosis present

## 2022-12-04 DIAGNOSIS — D509 Iron deficiency anemia, unspecified: Secondary | ICD-10-CM | POA: Diagnosis present

## 2022-12-04 DIAGNOSIS — K226 Gastro-esophageal laceration-hemorrhage syndrome: Secondary | ICD-10-CM | POA: Diagnosis present

## 2022-12-04 DIAGNOSIS — K2961 Other gastritis with bleeding: Secondary | ICD-10-CM | POA: Diagnosis present

## 2022-12-04 DIAGNOSIS — E860 Dehydration: Secondary | ICD-10-CM | POA: Diagnosis present

## 2022-12-04 DIAGNOSIS — Z79899 Other long term (current) drug therapy: Secondary | ICD-10-CM | POA: Diagnosis not present

## 2022-12-04 DIAGNOSIS — I1A0 Resistant hypertension: Secondary | ICD-10-CM | POA: Diagnosis present

## 2022-12-04 DIAGNOSIS — Z992 Dependence on renal dialysis: Secondary | ICD-10-CM

## 2022-12-04 DIAGNOSIS — D631 Anemia in chronic kidney disease: Secondary | ICD-10-CM | POA: Diagnosis present

## 2022-12-04 DIAGNOSIS — F17210 Nicotine dependence, cigarettes, uncomplicated: Secondary | ICD-10-CM | POA: Diagnosis present

## 2022-12-04 DIAGNOSIS — G43909 Migraine, unspecified, not intractable, without status migrainosus: Secondary | ICD-10-CM | POA: Diagnosis present

## 2022-12-04 DIAGNOSIS — N186 End stage renal disease: Secondary | ICD-10-CM | POA: Diagnosis present

## 2022-12-04 DIAGNOSIS — I5032 Chronic diastolic (congestive) heart failure: Secondary | ICD-10-CM | POA: Diagnosis not present

## 2022-12-04 DIAGNOSIS — H9190 Unspecified hearing loss, unspecified ear: Secondary | ICD-10-CM | POA: Diagnosis present

## 2022-12-04 DIAGNOSIS — K209 Esophagitis, unspecified without bleeding: Secondary | ICD-10-CM | POA: Diagnosis not present

## 2022-12-04 DIAGNOSIS — K922 Gastrointestinal hemorrhage, unspecified: Secondary | ICD-10-CM | POA: Diagnosis not present

## 2022-12-04 DIAGNOSIS — F319 Bipolar disorder, unspecified: Secondary | ICD-10-CM | POA: Diagnosis present

## 2022-12-04 DIAGNOSIS — K264 Chronic or unspecified duodenal ulcer with hemorrhage: Secondary | ICD-10-CM | POA: Diagnosis present

## 2022-12-04 DIAGNOSIS — F12188 Cannabis abuse with other cannabis-induced disorder: Secondary | ICD-10-CM | POA: Diagnosis present

## 2022-12-04 DIAGNOSIS — K449 Diaphragmatic hernia without obstruction or gangrene: Secondary | ICD-10-CM | POA: Diagnosis not present

## 2022-12-04 DIAGNOSIS — K254 Chronic or unspecified gastric ulcer with hemorrhage: Secondary | ICD-10-CM | POA: Diagnosis not present

## 2022-12-04 DIAGNOSIS — Z888 Allergy status to other drugs, medicaments and biological substances status: Secondary | ICD-10-CM | POA: Diagnosis not present

## 2022-12-04 DIAGNOSIS — I132 Hypertensive heart and chronic kidney disease with heart failure and with stage 5 chronic kidney disease, or end stage renal disease: Secondary | ICD-10-CM | POA: Diagnosis present

## 2022-12-04 DIAGNOSIS — D62 Acute posthemorrhagic anemia: Secondary | ICD-10-CM | POA: Diagnosis present

## 2022-12-04 DIAGNOSIS — Q8781 Alport syndrome: Secondary | ICD-10-CM | POA: Diagnosis not present

## 2022-12-04 DIAGNOSIS — K920 Hematemesis: Secondary | ICD-10-CM | POA: Diagnosis not present

## 2022-12-04 DIAGNOSIS — K2101 Gastro-esophageal reflux disease with esophagitis, with bleeding: Secondary | ICD-10-CM | POA: Diagnosis present

## 2022-12-04 DIAGNOSIS — E162 Hypoglycemia, unspecified: Secondary | ICD-10-CM | POA: Diagnosis present

## 2022-12-04 LAB — RENAL FUNCTION PANEL
Albumin: 3.3 g/dL — ABNORMAL LOW (ref 3.5–5.0)
Anion gap: 18 — ABNORMAL HIGH (ref 5–15)
BUN: 58 mg/dL — ABNORMAL HIGH (ref 6–20)
CO2: 27 mmol/L (ref 22–32)
Calcium: 9.5 mg/dL (ref 8.9–10.3)
Chloride: 92 mmol/L — ABNORMAL LOW (ref 98–111)
Creatinine, Ser: 11.71 mg/dL — ABNORMAL HIGH (ref 0.61–1.24)
GFR, Estimated: 5 mL/min — ABNORMAL LOW (ref >60)
Glucose, Bld: 86 mg/dL (ref 70–99)
Phosphorus: 7.3 mg/dL — ABNORMAL HIGH (ref 2.5–4.6)
Potassium: 4.6 mmol/L (ref 3.5–5.1)
Sodium: 137 mmol/L (ref 135–145)

## 2022-12-04 LAB — CBC
HCT: 21.9 % — ABNORMAL LOW (ref 39.0–52.0)
HCT: 22.4 % — ABNORMAL LOW (ref 39.0–52.0)
Hemoglobin: 7.2 g/dL — ABNORMAL LOW (ref 13.0–17.0)
Hemoglobin: 7.3 g/dL — ABNORMAL LOW (ref 13.0–17.0)
MCH: 31.7 pg (ref 26.0–34.0)
MCHC: 32.9 g/dL (ref 30.0–36.0)
MCHC: 32.6 g/dL (ref 30.0–36.0)
MCV: 96.9 fL (ref 80.0–100.0)
MCV: 97.4 fL (ref 80.0–100.0)
Platelets: 221 10*3/uL (ref 150–400)
Platelets: 252 10*3/uL (ref 150–400)
RBC: 2.26 MIL/uL — ABNORMAL LOW (ref 4.22–5.81)
RBC: 2.3 MIL/uL — ABNORMAL LOW (ref 4.22–5.81)
RDW: 16.7 % — ABNORMAL HIGH (ref 11.5–15.5)
RDW: 17.1 % — ABNORMAL HIGH (ref 11.5–15.5)
WBC: 7.8 10*3/uL (ref 4.0–10.5)
nRBC: 0 % (ref 0.0–0.2)
nRBC: 0 % (ref 0.0–0.2)

## 2022-12-04 LAB — IRON AND TIBC
Iron: 45 ug/dL (ref 45–182)
Saturation Ratios: 15 % — ABNORMAL LOW (ref 17.9–39.5)
TIBC: 298 ug/dL (ref 250–450)
UIBC: 253 ug/dL

## 2022-12-04 LAB — HIV ANTIBODY (ROUTINE TESTING W REFLEX): HIV Screen 4th Generation wRfx: NONREACTIVE

## 2022-12-04 LAB — GLUCOSE, CAPILLARY: Glucose-Capillary: 180 mg/dL — ABNORMAL HIGH (ref 70–99)

## 2022-12-04 LAB — FERRITIN: Ferritin: 340 ng/mL — ABNORMAL HIGH (ref 24–336)

## 2022-12-04 MED ORDER — HEPARIN SODIUM (PORCINE) 1000 UNIT/ML DIALYSIS
1000.0000 [IU] | INTRAMUSCULAR | Status: DC | PRN
  Administered 2022-12-04: 1000 [IU]
  Filled 2022-12-04: qty 1

## 2022-12-04 MED ORDER — HYDROMORPHONE HCL 1 MG/ML IJ SOLN
0.5000 mg | Freq: Once | INTRAMUSCULAR | Status: AC
  Administered 2022-12-04: 1.5 mg via INTRAVENOUS
  Filled 2022-12-04: qty 1.5

## 2022-12-04 MED ORDER — SUMATRIPTAN SUCCINATE 50 MG PO TABS: 50.0000 mg | ORAL_TABLET | ORAL | Status: DC | PRN

## 2022-12-04 MED ORDER — CHLORHEXIDINE GLUCONATE CLOTH 2 % EX PADS
6.0000 | MEDICATED_PAD | Freq: Every day | CUTANEOUS | Status: DC
  Administered 2022-12-04 – 2022-12-05 (×2): 6 via TOPICAL

## 2022-12-04 MED ORDER — CHLORHEXIDINE GLUCONATE CLOTH 2 % EX PADS
6.0000 | MEDICATED_PAD | Freq: Every day | CUTANEOUS | Status: DC
  Administered 2022-12-05: 6 via TOPICAL

## 2022-12-04 MED ORDER — HYDROMORPHONE HCL 1 MG/ML IJ SOLN
0.5000 mg | Freq: Once | INTRAMUSCULAR | Status: AC | PRN
  Administered 2022-12-04: 1.5 mg via INTRAVENOUS
  Filled 2022-12-04: qty 1.5

## 2022-12-04 MED ORDER — DEXAMETHASONE SODIUM PHOSPHATE 4 MG/ML IJ SOLN
4.0000 mg | Freq: Once | INTRAMUSCULAR | Status: AC
  Administered 2022-12-04: 4 mg via INTRAVENOUS
  Filled 2022-12-04: qty 1

## 2022-12-04 MED ORDER — ONDANSETRON HCL 4 MG/2ML IJ SOLN
4.0000 mg | Freq: Once | INTRAMUSCULAR | Status: AC
  Administered 2022-12-04: 4 mg via INTRAVENOUS
  Filled 2022-12-04: qty 2

## 2022-12-04 MED ORDER — DIPHENHYDRAMINE HCL 25 MG PO CAPS
25.0000 mg | ORAL_CAPSULE | Freq: Once | ORAL | Status: DC
  Filled 2022-12-04: qty 1

## 2022-12-04 MED ORDER — METOCLOPRAMIDE HCL 5 MG PO TABS
5.0000 mg | ORAL_TABLET | Freq: Four times a day (QID) | ORAL | Status: DC | PRN
  Administered 2022-12-04 (×2): 5 mg via ORAL
  Filled 2022-12-04: qty 1
  Filled 2022-12-04: qty 0.5

## 2022-12-04 MED ORDER — SODIUM CHLORIDE 0.9 % IV SOLN
125.0000 mg | INTRAVENOUS | Status: DC
  Administered 2022-12-04: 125 mg via INTRAVENOUS
  Filled 2022-12-04: qty 10

## 2022-12-04 MED ORDER — LABETALOL HCL 100 MG PO TABS
100.0000 mg | ORAL_TABLET | Freq: Three times a day (TID) | ORAL | Status: DC
  Administered 2022-12-04 (×2): 100 mg via ORAL
  Administered 2022-12-05: 10 mg via ORAL
  Administered 2022-12-05: 5 mg via ORAL
  Filled 2022-12-04 (×4): qty 1

## 2022-12-04 MED ORDER — SODIUM CHLORIDE 0.9 % IV SOLN: INTRAVENOUS | Status: DC

## 2022-12-04 MED ORDER — DIPHENHYDRAMINE HCL 25 MG PO CAPS
50.0000 mg | ORAL_CAPSULE | Freq: Once | ORAL | Status: AC | PRN
  Administered 2022-12-04: 50 mg via ORAL
  Filled 2022-12-04: qty 2

## 2022-12-04 MED ORDER — ALTEPLASE 2 MG IJ SOLR: 2.0000 mg | Freq: Once | INTRAMUSCULAR | Status: DC | PRN

## 2022-12-04 MED ORDER — ACETAMINOPHEN 325 MG PO TABS: 650.0000 mg | ORAL_TABLET | Freq: Four times a day (QID) | ORAL | Status: DC | PRN

## 2022-12-04 MED ORDER — ACETAMINOPHEN 650 MG RE SUPP: 650.0000 mg | Freq: Four times a day (QID) | RECTAL | Status: DC | PRN

## 2022-12-04 MED ORDER — ANTICOAGULANT SODIUM CITRATE 4% (200MG/5ML) IV SOLN: 5.0000 mL | Status: DC | PRN

## 2022-12-04 MED ORDER — CALCITRIOL 0.5 MCG PO CAPS
1.2500 ug | ORAL_CAPSULE | ORAL | Status: DC
  Administered 2022-12-04: 1.25 ug via ORAL
  Filled 2022-12-04: qty 1

## 2022-12-04 NOTE — Progress Notes (Signed)
Reviewed and agree with Dr Koval's plan.   

## 2022-12-04 NOTE — H&P (View-Only) (Signed)
Progress Note   LOS: 0 days   Chief Complaint: Hematemesis   Subjective   Patient states he has a headache.  Has had multiple bowel movements with small amount of bright red blood.  He had flushed the toilet before my evaluation.  Denies further nausea/vomiting.   Objective   Vital signs in last 24 hours: Temp:  [97.6 F (36.4 C)-98.4 F (36.9 C)] 97.6 F (36.4 C) (07/25 0600) Pulse Rate:  [99-109] 99 (07/25 0800) Resp:  [13-22] 18 (07/25 0600) BP: (173-248)/(105-135) 173/105 (07/25 1200) SpO2:  [92 %-100 %] 94 % (07/25 0600) Last BM Date : 12/03/22 Last BM recorded by nurses in past 5 days No data recorded  General:   male in no acute distress  Heart:  Regular rate and rhythm; no murmurs Pulm: Clear anteriorly; no wheezing Abdomen: soft, nondistended, normal bowel sounds in all quadrants. Nontender without guarding. No organomegaly appreciated. Extremities:  No edema Neurologic:  Alert and  oriented x4;  No focal deficits.  Psych:  Cooperative. Normal mood and affect.  Intake/Output from previous day: 07/24 0701 - 07/25 0700 In: 480 [P.O.:480] Out: -  Intake/Output this shift: No intake/output data recorded.  Studies/Results: DG Chest Portable 1 View  Result Date: 12/03/2022 CLINICAL DATA:  Hypertension, evaluate for edema, dialysis patient EXAM: PORTABLE CHEST 1 VIEW COMPARISON:  11/18/2022 FINDINGS: Right IJ tunneled HD catheter mid SVC level. Cardiomegaly noted with vascular prominence and diffuse mild increased interstitial changes throughout both lungs compatible with early interstitial edema pattern. Minor associated basilar atelectasis. No large effusion or pneumothorax. Trachea midline. Nonobstructive bowel gas pattern. No osseous abnormality. IMPRESSION: 1. Cardiomegaly with early interstitial edema pattern. 2. Bibasilar atelectasis. Electronically Signed   By: Judie Petit.  Shick M.D.   On: 12/03/2022 15:21    Lab Results: Recent Labs    12/03/22 1121  12/04/22 0800  WBC 8.4 7.3  HGB 7.4* 7.2*  HCT 23.2* 21.9*  PLT 243 221   BMET Recent Labs    12/03/22 1121 12/04/22 0800  NA 137 137  K 4.3 4.6  CL 94* 92*  CO2 26 27  GLUCOSE 80 86  BUN 41* 58*  CREATININE 9.40* 11.71*  CALCIUM 9.4 9.5   LFT Recent Labs    12/03/22 1121 12/04/22 0800  PROT 6.3*  --   ALBUMIN 3.5 3.3*  AST 28  --   ALT 21  --   ALKPHOS 66  --   BILITOT 0.2*  --    PT/INR No results for input(s): "LABPROT", "INR" in the last 72 hours.   Scheduled Meds:  amLODipine  10 mg Oral Daily   calcitRIOL  1.25 mcg Oral Q T,Th,Sat-1800   calcium acetate  667 mg Oral TID WC   capsaicin   Topical BID   Chlorhexidine Gluconate Cloth  6 each Topical Daily   [START ON 12/05/2022] Chlorhexidine Gluconate Cloth  6 each Topical Q0600   cloNIDine  0.1 mg Oral BID   divalproex  500 mg Oral QHS   hydrALAZINE  100 mg Oral TID   labetalol  100 mg Oral BID   losartan  100 mg Oral Daily   metoCLOPramide (REGLAN) injection  10 mg Intravenous Once   pantoprazole  40 mg Oral BID   Continuous Infusions:  ferric gluconate (FERRLECIT) IVPB        Patient profile:   37 year old male history of Alport syndrome, ESRD on dialysis, uncontrolled hypertension, chronic reflux, diastolic CHF, presents for evaluation of hematemesis following multiple  episodes of nausea and vomiting with intense retching.  History of gastritis and duodenitis on EGD with negative H. pylori and intestinal metaplasia at [facility] 11/12/2022.    Impression/Plan:   Hematemesis Suspected Mallory-Weiss tear secondary to persistent nausea and vomiting.  Stable hemoglobin and blood pressure Hgb 7.2, stable (baseline 7-9) BUN 58, creatinine 11.71, GFR 5.  Due for dialysis today. Iron 45, TIBC 298, saturation 15% Ferritin 340 -EGD tomorrow -I thoroughly discussed the procedure with the patient (at bedside) to include nature of the procedure, alternatives, benefits, and risks (including but not limited to  bleeding, infection, perforation, anesthesia/cardiac pulmonary complications).  Patient verbalized understanding and gave verbal consent to proceed with procedure. -continue PPI -continue supportive care   Chronic recurrent hematochezia Normal flexible sigmoidoscopy 04/2022 -Suspect hemorrhoids.  Can trial hydrocortisone perianal cream as needed if bothersome  ESRD on HD  CHF  Lanika Colgate M Yuuki Skeens  12/04/2022, 12:30 PM

## 2022-12-04 NOTE — Plan of Care (Signed)
  Problem: Education: Goal: Knowledge of General Education information will improve Description: Including pain rating scale, medication(s)/side effects and non-pharmacologic comfort measures Outcome: Progressing   Problem: Clinical Measurements: Goal: Ability to maintain clinical measurements within normal limits will improve Outcome: Progressing Goal: Respiratory complications will improve Outcome: Progressing   Problem: Activity: Goal: Risk for activity intolerance will decrease Outcome: Progressing   Problem: Coping: Goal: Level of anxiety will decrease Outcome: Progressing   Problem: Elimination: Goal: Will not experience complications related to bowel motility Outcome: Progressing   Problem: Pain Managment: Goal: General experience of comfort will improve Outcome: Progressing   Problem: Safety: Goal: Ability to remain free from injury will improve Outcome: Progressing   Problem: Skin Integrity: Goal: Risk for impaired skin integrity will decrease Outcome: Progressing

## 2022-12-04 NOTE — Progress Notes (Signed)
Pt returned from dialysis. Pt states they've had a migraine all day and is requesting their migraine medication. Pt states everything they receieved throughout the day did not help their migraine and feels like the dilaudid made it worse and just made them drowsy. Pts BP has also been elevated throughout the day. Providers notified; Consulting civil engineer at bedside.    12/04/22 2018  Vitals  Temp 97.8 F (36.6 C)  Temp Source Oral  BP (!) 194/109  MAP (mmHg) 134  BP Location Left Arm  BP Method Automatic  Patient Position (if appropriate) Lying  Pulse Rate (!) 101  Pulse Rate Source Dinamap  ECG Heart Rate 99  Resp 12  MEWS COLOR  MEWS Score Color Yellow  Oxygen Therapy  SpO2 100 %  O2 Device Nasal Cannula  O2 Flow Rate (L/min) 2 L/min

## 2022-12-04 NOTE — Progress Notes (Signed)
Pt receives out-pt HD at [facility] on TTS. Will assist as needed.   Tracy Mounce Renal Navigator [phone] 

## 2022-12-04 NOTE — Progress Notes (Signed)
Patient complaining of a headache this morning. Messaged the attending about his migraine medication. Will CTM.

## 2022-12-04 NOTE — Assessment & Plan Note (Deleted)
ESRD on HD (TTS) followed by nephrology - Nephro on board, appreciate recs - Due for dialysis today

## 2022-12-04 NOTE — Consult Note (Signed)
Renal Service Consult Note [facility]  [name] 12/04/2022 Edward Krabbe, MD Requesting Physician: Dr. Wende Neighbors  Reason for Consult: ESRD pt w/  HPI: The patient is a 37 y.o. year-old w/ PMH as below who presented to ED 7/24 w/ hematemesis and blood in stool starting about the night before. Gen'd weakness. In ED BP 180-210/ 108, HR 104, RR 17, afeb, on RA 98%. CXR showed IS edema, mild but new. Labs showed Hb 7.2, wbc 7K, K 4.6 creat 11.7. ca 9.5  alb 3.3.  Patient was admitted for GI bleed. PPI ordered and GI consulted. Also uncont HTN. We are asked to see for dialysis.   Pt seen in room.  Has TDC and R arm perm access. Says they are  taking out his RUA fistula next week because it is "too big". Denies any SOB at this time.       ROS - denies CP, no joint pain, no HA, no blurry vision, no rash, no diarrhea, no nausea/ vomiting, no dysuria, no difficulty voiding   Past Medical History  Past Medical History:  Diagnosis Date   Bipolar 1 disorder (HCC)    Depression    ESRD on hemodialysis (HCC)    [facility] schedule   GERD (gastroesophageal reflux disease)    Hearing difficulty of left ear    75% hearing   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   Hypertension    Low blood sugar    Past Surgical History  Past Surgical History:  Procedure Laterality Date   A/V FISTULAGRAM Right 10/27/2022   Procedure: A/V Fistulagram;  Surgeon: Chuck Hint, MD;  Location: [facility] INVASIVE CV LAB;  Service: Cardiovascular;  Laterality: Right;   APPENDECTOMY     AV FISTULA PLACEMENT Left 03/03/2019   Procedure: ARTERIOVENOUS (AV) FISTULA CREATION LEFT ARM;  Surgeon: Maeola Harman, MD;  Location: [facility] OR;  Service: Vascular;  Laterality: Left;   BASCILIC VEIN TRANSPOSITION Right 04/12/2019   Procedure: RIGHT UPPER EXTREMITY BASCILIC VEIN TRANSPOSITION FIRST STAGE FISTULA;  Surgeon: Chuck Hint, MD;  Location: [facility] OR;  Service:  Vascular;  Laterality: Right;   BIOPSY  10/14/2021   Procedure: BIOPSY;  Surgeon: Sherrilyn Rist, MD;  Location: [facility] ENDOSCOPY;  Service: Gastroenterology;;   BIOPSY  06/06/2022   Procedure: BIOPSY;  Surgeon: Napoleon Form, MD;  Location: [facility] ENDOSCOPY;  Service: Gastroenterology;;   BIOPSY  11/12/2022   Procedure: BIOPSY;  Surgeon: Lemar Lofty., MD;  Location: [facility] ENDOSCOPY;  Service: Gastroenterology;;   COLONOSCOPY WITH PROPOFOL N/A 10/14/2021   Procedure: COLONOSCOPY WITH PROPOFOL;  Surgeon: Sherrilyn Rist, MD;  Location: [facility] ENDOSCOPY;  Service: Gastroenterology;  Laterality: N/A;   ESOPHAGOGASTRODUODENOSCOPY N/A 11/12/2022   Procedure: ESOPHAGOGASTRODUODENOSCOPY (EGD);  Surgeon: Lemar Lofty., MD;  Location: [facility] ENDOSCOPY;  Service: Gastroenterology;  Laterality: N/A;   ESOPHAGOGASTRODUODENOSCOPY (EGD) WITH PROPOFOL N/A 06/06/2022   Procedure: ESOPHAGOGASTRODUODENOSCOPY (EGD) WITH PROPOFOL;  Surgeon: Napoleon Form, MD;  Location: [facility] ENDOSCOPY;  Service: Gastroenterology;  Laterality: N/A;   FISTULA SUPERFICIALIZATION Right 11/07/2022   Procedure: PLICATION OF LARGE ANEURYSMS OF RIGHT ARTERIOVENOUS FISTULA WITH INSERTION OF 7mm INTERPOSITIONAL GORTEX GRAFT;  Surgeon: Chuck Hint, MD;  Location: [facility] OR;  Service: Vascular;  Laterality: Right;   FLEXIBLE SIGMOIDOSCOPY N/A 04/14/2022   Procedure: FLEXIBLE SIGMOIDOSCOPY;  Surgeon: Napoleon Form, MD;  Location: [facility] ENDOSCOPY;  Service: Gastroenterology;  Laterality: N/A;   INSERTION  OF DIALYSIS CATHETER N/A 11/07/2022   Procedure: INSERTION OF RIGHT INTERNAL JUGULAR TUNNELED DIALYSIS CATHETER;  Surgeon: Chuck Hint, MD;  Location: [facility] OR;  Service: Vascular;  Laterality: N/A;   LIGATION OF ARTERIOVENOUS  FISTULA Left 03/06/2019   Procedure: LIGATION OF ARTERIOVENOUS  FISTULA;  Surgeon: Cephus Shelling, MD;  Location: [facility] OR;  Service: Vascular;  Laterality: Left;   PERIPHERAL VASCULAR BALLOON  ANGIOPLASTY  10/27/2022   Procedure: PERIPHERAL VASCULAR BALLOON ANGIOPLASTY;  Surgeon: Chuck Hint, MD;  Location: [facility] INVASIVE CV LAB;  Service: Cardiovascular;;  rt upper arm fistula   SAVORY DILATION N/A 11/12/2022   Procedure: SAVORY DILATION;  Surgeon: Meridee Score Netty Starring., MD;  Location: [facility] ENDOSCOPY;  Service: Gastroenterology;  Laterality: N/A;   spinal tap     SPINE SURGERY     related to a spinal infection, unsure of surgery or infection source   WISDOM TOOTH EXTRACTION     Family History  Family History  Problem Relation Age of Onset   Hypertension Mother    Kidney failure Mother    Diabetes Mother    Hearing loss Father    Hypertension Sister    Heart disease Maternal Grandmother    Stroke Maternal Grandfather    Asthma Son    Social History  reports that he has been smoking cigarettes. He has never used smokeless tobacco. He reports that he does not currently use alcohol. He reports current drug use. Drug: Marijuana. Allergies  Allergies  Allergen Reactions   Zestril [Lisinopril] Swelling   Nsaids Other (See Comments)    "Kidney problems "   Risperdal [Risperidone] Other (See Comments)    Unknown reaction    Carvedilol Other (See Comments)    Makes mouth numb and makes blood sugar drop   Home medications Prior to Admission medications   Medication Sig Start Date End Date Taking? Authorizing Provider  acetaminophen (TYLENOL) 325 MG tablet Take 2 tablets (650 mg total) by mouth every 6 (six) hours as needed for mild pain (or Fever >/= 101). 04/14/22  Yes Lockie Mola, MD  amLODipine (NORVASC) 10 MG tablet TAKE 1 TABLET BY MOUTH EVERY DAY 04/10/22  Yes Sabino Dick, DO  calcium acetate (PHOSLO) 667 MG capsule Take 1 capsule (667 mg total) by mouth 3 (three) times daily with meals. 01/04/22  Yes Vonna Drafts, MD  cloNIDine (CATAPRES) 0.1 MG tablet Take 1 tablet (0.1 mg total) by mouth 2 (two) times daily. 12/03/22  Yes McDiarmid, Leighton Roach, MD   divalproex (DEPAKOTE ER) 500 MG 24 hr tablet Take 1 tablet (500 mg total) by mouth at bedtime. 08/05/22  Yes Sabino Dick, DO  hydrALAZINE (APRESOLINE) 100 MG tablet Take 1 tablet (100 mg total) by mouth 3 (three) times daily. 05/20/22  Yes Gaston Islam., NP  losartan (COZAAR) 100 MG tablet Take 1 tablet (100 mg total) by mouth daily. 11/15/22  Yes Baglia, Corrina, PA-C  pantoprazole (PROTONIX) 40 MG tablet TAKE 1 TABLET BY MOUTH EVERY DAY Patient taking differently: Take 40 mg by mouth 2 (two) times daily. 09/17/22  Yes Sabino Dick, DO  triamcinolone ointment (KENALOG) 0.1 % Apply to scrotum twice daily for itching. Do not use for more than 2 weeks continuously 05/30/22  Yes Sabino Dick, DO     Vitals:   12/03/22 1730 12/03/22 1839 12/03/22 1948 12/04/22 0600  BP: (!) 248/126 (!) 218/135 (!) 191/108 (!) 195/111  Pulse: (!) 101 (!) 104 (!) 103 100  Resp: 16  17 18  Temp: 97.9 F (36.6 C)  98.4 F (36.9 C) 97.6 F (36.4 C)  TempSrc: Oral  Oral Oral  SpO2: 100%  98% 94%  Weight:      Height:       Exam Gen alert, no distress No rash, cyanosis or gangrene Sclera anicteric, throat clear  No jvd or bruits Chest clear bilat to bases, no rales/ wheezing RRR no MRG Abd soft ntnd no mass or ascites +bs GU nl male MS no joint effusions or deformity Ext no LE or UE edema, no wounds or ulcers Neuro is alert, Ox 3 , nf    RIJ TDC, RUA AVF+bruit    Home meds include - tylenol, amlodipine 10, phoslo 1 ac, clonidine 0.1 bid, divalproex, hydralazine 100 tid, losartan 100 every day, pantoprazole, kenalog ointment    OP HD: [facility]  4h  400/1.5  73.5kg  AVF   Heparin 1200 - last OP HD 7/23, post wt 76.1kg - coming off over by 2-6 kg over the last 3 wks except for 1-2 times - rocaltrol 1.25 mcg po three times per week - venofer 100mg  IV three times per week through 8/08 - mircera 150 mcg IV q 2 wks, last 7/18, due 8/01 - last-  Hb 8.1, pth 980, tsat 18% on  7/11   Assessment/ Plan: GI bleed - w/ hematemesis and blood in stool.  Hb 7s, GI evaluating. BP's are high.  ESRD - on HD TTS. Last HD 2d ago, due for HD today. Plan HD this afternoon.  HTN/ volume - bp's are high which is chronic issue for this pt. Pt is up 4kg, max UF w/ HD today. Cont BP meds per pmd.  Anemia esrd - Hb 7-8 range here. Next esa is due 8/01. Transfuse prn Hb < 7.  Will cont oupt IV Fe load while here.  MBD ckd - CCa in range, phos a bit high at 7.3.  Cont po vdra and phoslo as binder.  HD access - on 6/28 underwent excision of large aneurysmal RUA fistula w/ revision using 7 mm AVG. In outpt f/u note 7/18 [facility] state that the pt and stepmother were frustrated w/ his care at [facility] and requested a referral to [facility] for further permanent vasc access issues, which was done by [facility].       Vinson Moselle  MD [facility] 12/04/2022, 11:44 AM  Recent Labs  Lab 12/03/22 1121 12/04/22 0800  HGB 7.4* 7.2*  ALBUMIN 3.5 3.3*  CALCIUM 9.4 9.5  PHOS  --  7.3*  CREATININE 9.40* 11.71*  K 4.3 4.6   Inpatient medications:  amLODipine  10 mg Oral Daily   calcium acetate  667 mg Oral TID WC   capsaicin   Topical BID   Chlorhexidine Gluconate Cloth  6 each Topical Daily   cloNIDine  0.1 mg Oral BID   divalproex  500 mg Oral QHS   hydrALAZINE  100 mg Oral TID   labetalol  100 mg Oral BID   losartan  100 mg Oral Daily   metoCLOPramide (REGLAN) injection  10 mg Intravenous Once   pantoprazole  40 mg Oral BID    acetaminophen **OR** acetaminophen, diphenhydrAMINE, metoCLOPramide

## 2022-12-04 NOTE — Progress Notes (Signed)
POST HD TX NOTE  12/04/22 1911  Vitals  Temp 97.7 F (36.5 C)  Temp Source Oral  BP (!) 197/104  MAP (mmHg) 132  BP Location Left Arm  BP Method Automatic  Patient Position (if appropriate) Lying  Pulse Rate 99  Pulse Rate Source Monitor  ECG Heart Rate 98  Resp (!) 4  Oxygen Therapy  SpO2 93 %  O2 Device Nasal Cannula  O2 Flow Rate (L/min) 2 L/min  Pulse Oximetry Type Continuous  During Treatment Monitoring  Intra-Hemodialysis Comments (S)   (post HD tx VS check)  Post Treatment  Dialyzer Clearance Heavily streaked (system almost completely clotted off, able to rinse back though)  Duration of HD Treatment -hour(s) 3.25 hour(s) (3hr 6 min, came off 39 min early)  Hemodialysis Intake (mL) 110 mL (Ferric Gluconate)  Liters Processed 74.1  Fluid Removed (mL) 2790 mL ( (value from machine) - (Ferric Gluconate) = )  Tolerated HD Treatment Yes  Post-Hemodialysis Comments (S)  tx ended 39 min early d/t system clotting off, TMP -98 right before deciding to rinse back so we wouldn't lose blood. UF goal not met, blood rinsed back, VSS. Medication ADmin: Ferric Gluconate 125mg  IVPB, Dilaudid 1.5mg  IVP x2, Zofran 4mg  IVP, Hydralazine 100mg  p0, labetalol 100mg  po, Heparin Dwells 3200 units  Hemodialysis Catheter Right Internal jugular Double lumen Permanent (Tunneled)  Placement Date/Time: 11/07/22 0816   Serial / Lot #: [id number]  Time Out: Correct patient;Correct site;Correct procedure  Maximum sterile barrier precautions: Hand hygiene;Cap;Mask;Sterile gown;Sterile gloves;Large sterile sheet  Site Prep: Chlorhexid...  Site Condition No complications  Blue Lumen Status Heparin locked;Dead end cap in place  Red Lumen Status Heparin locked;Dead end cap in place  Purple Lumen Status N/A  Catheter fill solution Heparin 1000 units/ml  Catheter fill volume (Arterial) 1.6 cc  Catheter fill volume (Venous) 1.6  Dressing Type Transparent  Dressing Status Antimicrobial disc  in place;Clean, Dry, Intact  Drainage Description None  Dressing Change Due 12/11/22  Post treatment catheter status Capped and Clamped

## 2022-12-04 NOTE — Progress Notes (Signed)
Progress Note   LOS: 0 days   Chief Complaint: Hematemesis   Subjective   Patient states he has a headache.  Has had multiple bowel movements with small amount of bright red blood.  He had flushed the toilet before my evaluation.  Denies further nausea/vomiting.   Objective   Vital signs in last 24 hours: Temp:  [97.6 F (36.4 C)-98.4 F (36.9 C)] 97.6 F (36.4 C) (07/25 0600) Pulse Rate:  [99-109] 99 (07/25 0800) Resp:  [13-22] 18 (07/25 0600) BP: (173-248)/(105-135) 173/105 (07/25 1200) SpO2:  [92 %-100 %] 94 % (07/25 0600) Last BM Date : 12/03/22 Last BM recorded by nurses in past 5 days No data recorded  General:   male in no acute distress  Heart:  Regular rate and rhythm; no murmurs Pulm: Clear anteriorly; no wheezing Abdomen: soft, nondistended, normal bowel sounds in all quadrants. Nontender without guarding. No organomegaly appreciated. Extremities:  No edema Neurologic:  Alert and  oriented x4;  No focal deficits.  Psych:  Cooperative. Normal mood and affect.  Intake/Output from previous day: 07/24 0701 - 07/25 0700 In: 480 [P.O.:480] Out: -  Intake/Output this shift: No intake/output data recorded.  Studies/Results: DG Chest Portable 1 View  Result Date: 12/03/2022 CLINICAL DATA:  Hypertension, evaluate for edema, dialysis patient EXAM: PORTABLE CHEST 1 VIEW COMPARISON:  11/18/2022 FINDINGS: Right IJ tunneled HD catheter mid SVC level. Cardiomegaly noted with vascular prominence and diffuse mild increased interstitial changes throughout both lungs compatible with early interstitial edema pattern. Minor associated basilar atelectasis. No large effusion or pneumothorax. Trachea midline. Nonobstructive bowel gas pattern. No osseous abnormality. IMPRESSION: 1. Cardiomegaly with early interstitial edema pattern. 2. Bibasilar atelectasis. Electronically Signed   By: Judie Petit.  Shick M.D.   On: 12/03/2022 15:21    Lab Results: Recent Labs    12/03/22 1121  12/04/22 0800  WBC 8.4 7.3  HGB 7.4* 7.2*  HCT 23.2* 21.9*  PLT 243 221   BMET Recent Labs    12/03/22 1121 12/04/22 0800  NA 137 137  K 4.3 4.6  CL 94* 92*  CO2 26 27  GLUCOSE 80 86  BUN 41* 58*  CREATININE 9.40* 11.71*  CALCIUM 9.4 9.5   LFT Recent Labs    12/03/22 1121 12/04/22 0800  PROT 6.3*  --   ALBUMIN 3.5 3.3*  AST 28  --   ALT 21  --   ALKPHOS 66  --   BILITOT 0.2*  --    PT/INR No results for input(s): "LABPROT", "INR" in the last 72 hours.   Scheduled Meds:  amLODipine  10 mg Oral Daily   calcitRIOL  1.25 mcg Oral Q T,Th,Sat-1800   calcium acetate  667 mg Oral TID WC   capsaicin   Topical BID   Chlorhexidine Gluconate Cloth  6 each Topical Daily   [START ON 12/05/2022] Chlorhexidine Gluconate Cloth  6 each Topical Q0600   cloNIDine  0.1 mg Oral BID   divalproex  500 mg Oral QHS   hydrALAZINE  100 mg Oral TID   labetalol  100 mg Oral BID   losartan  100 mg Oral Daily   metoCLOPramide (REGLAN) injection  10 mg Intravenous Once   pantoprazole  40 mg Oral BID   Continuous Infusions:  ferric gluconate (FERRLECIT) IVPB        Patient profile:   37 year old male history of Alport syndrome, ESRD on dialysis, uncontrolled hypertension, chronic reflux, diastolic CHF, presents for evaluation of hematemesis following multiple  episodes of nausea and vomiting with intense retching.  History of gastritis and duodenitis on EGD with negative H. pylori and intestinal metaplasia at [facility] 11/12/2022.    Impression/Plan:   Hematemesis Suspected Mallory-Weiss tear secondary to persistent nausea and vomiting.  Stable hemoglobin and blood pressure Hgb 7.2, stable (baseline 7-9) BUN 58, creatinine 11.71, GFR 5.  Due for dialysis today. Iron 45, TIBC 298, saturation 15% Ferritin 340 -EGD tomorrow -I thoroughly discussed the procedure with the patient (at bedside) to include nature of the procedure, alternatives, benefits, and risks (including but not limited to  bleeding, infection, perforation, anesthesia/cardiac pulmonary complications).  Patient verbalized understanding and gave verbal consent to proceed with procedure. -continue PPI -continue supportive care   Chronic recurrent hematochezia Normal flexible sigmoidoscopy 04/2022 -Suspect hemorrhoids.  Can trial hydrocortisone perianal cream as needed if bothersome  ESRD on HD  CHF  Diezel Mazur M Anice Wilshire  12/04/2022, 12:30 PM

## 2022-12-04 NOTE — Progress Notes (Signed)
Patient with high blood pressure still notified Attending.

## 2022-12-04 NOTE — Progress Notes (Signed)
Sent Blood pressure medications via tube to station 84 to RN in dialysis but she did not receive it. Tory RN said she would request from pharmacy

## 2022-12-04 NOTE — Progress Notes (Signed)
Daily Progress Note Intern Pager: [phone]  Patient name: [name] Medical record number: [id number] Date of birth: 01-08-1986 Age: 37 y.o. Gender: male  Primary Care Provider: [name], DO Consultants: GI, Nephrology Code Status: Full Code  Pt Overview and Major Events to Date:  7/24: Admitted  Assessment and Plan: [name] is a 37 y.o. male with past medical history of Alport Syndrome, ESRD on HD (TTS), resistant HTN presenting with hematemesis and hematochezia.   -      Hospital     * (Principal) Acute GI bleeding     Patient has a history of hematochezia for the past 2 weeks with large  volumes and streaks of blood. Last BM yesterday evening. 5 episodes of  vomiting overnight, large in volume and streaked with blood. Last episode  at 5 am today. Associates with dizziness, lightheadedness, and severe  headache.Hgb stable in the ED at 7.4. Last EGD 11/12/22 which showed  gastritis, gastric erosion, 3 cm hiatal hernia, and duodenitis. - NPO at [facility] for EGD 7/26 - PO Reglan for nausea and headache - Topical capsaicin for cannabis hyperemesis, but patient has not been  using - EKG pending - AM CBC/RFP - GI on board, appreciate recs - Strict I&Os        Anemia     2/2  acute blood loss v chronic disease.  Iron studies Fe 45 (on the  lower end of normal), TIBC 298, and ferritin elevated to 340. - CBC AM  - Low threshold to recheck earlier if begins having episodes of bleeding  - Transfusion <7 - IV Ferrlecit 125 mg at 140mL/hr on TTS with HD        Headache     Patient was agitated upon admission due to severe headache, likely due  to dehydration from vomiting, bloody bowel movements, elevated BP, and h/o  migraines. Previously on Imitrex for migraines but was Dc'd at pt's  request and will continue to avoid due to possibility of increasing his  BP.  - PO Reglan and Benadryl to manage his headaches - Tylenol  - Consider IV compazine if  uncontrolled     Chronic Stable Conditions ESRD: on HDTTS, last session reported on Tuesday, nephrology following inpatient  HTN: Continued home medications: Amlodipine 10 mg daily, Clonidine 0.1 mg daily, Hydralazine 100 mg TID, Labetalol 100 mg BID, Losartan 100 mg daily  Bipolar Disorder: Continued home Depakote 500 mg daily   FEN/GI: Soft diet VTE Prophylaxis: SCDs, high risk for GI bleeds Disposition: pending EGD and clinical improvement  Subjective:  Patient is doing better today.  He still complains of headache and felt overloaded, nausea, and had difficulty breathing.  Patient understands that he will get an EGD tomorrow to evaluate his symptoms further.  Reports no other episodes of vomiting.  However, does report bloody bowel movement earlier this morning.  Objective: Temp:  [97.6 F (36.4 C)-98.4 F (36.9 C)] 97.6 F (36.4 C) (07/25 0600) Pulse Rate:  [95-107] 95 (07/25 1251) Resp:  [15-22] 20 (07/25 1251) BP: (173-248)/(105-135) 188/113 (07/25 1251) SpO2:  [92 %-100 %] 100 % (07/25 1251) Physical Exam: General: NAD, awake and alert, headache ENTM: deaf d/t Alport Syndrome, wears hearing aids Cardiovascular: RRR. No M/R/G Respiratory: CTAB, normal WOB on RA. No wheezing, crackles, or rhonchi. Abdomen: soft, non-tender, non-distended. Bowel sounds normoactive Extremities: no BLE edema Neuro: A&Ox3. No focal neurological deficits. Psych: normal mood Extremities: RUE fistula with aneurysm, no BLE edema  Laboratory: Most recent CBC Lab Results  Component Value Date   WBC 7.3 12/04/2022   HGB 7.2 (L) 12/04/2022   HCT 21.9 (L) 12/04/2022   MCV 96.9 12/04/2022   PLT 221 12/04/2022   Most recent BMP    Latest Ref Rng & Units 12/04/2022    8:00 AM  BMP  Glucose 70 - 99 mg/dL 86   BUN 6 - 20 mg/dL 58   Creatinine 1.61 - 1.24 mg/dL 09.60   Sodium 454 - 098 mmol/L 137   Potassium 3.5 - 5.1 mmol/L 4.6   Chloride 98 - 111 mmol/L 92   CO2 22 - 32 mmol/L 27    Calcium 8.9 - 10.3 mg/dL 9.5     Imaging/Diagnostic Tests: No new imaging.  Fortunato Curling, DO 12/04/2022, 1:11 PM PGY-1, [facility]  FPTS Intern pager: [phone], text pages welcome Secure chat group [facility] Teaching Service

## 2022-12-04 NOTE — Procedures (Signed)
I was present at this dialysis session, have reviewed the session and made  appropriate changes Vinson Moselle MD  [facility] 12/04/2022, 3:09 PM

## 2022-12-05 ENCOUNTER — Encounter (HOSPITAL_COMMUNITY): Payer: Self-pay | Admitting: Family Medicine

## 2022-12-05 ENCOUNTER — Inpatient Hospital Stay (HOSPITAL_COMMUNITY): Payer: Medicare Other | Admitting: Certified Registered"

## 2022-12-05 ENCOUNTER — Encounter (HOSPITAL_COMMUNITY)
Admission: EM | Disposition: A | Discharge status: Home / Self Care | Payer: Self-pay | Source: Home / Self Care | Attending: Family Medicine

## 2022-12-05 DIAGNOSIS — K254 Chronic or unspecified gastric ulcer with hemorrhage: Secondary | ICD-10-CM

## 2022-12-05 DIAGNOSIS — I132 Hypertensive heart and chronic kidney disease with heart failure and with stage 5 chronic kidney disease, or end stage renal disease: Secondary | ICD-10-CM

## 2022-12-05 DIAGNOSIS — I5032 Chronic diastolic (congestive) heart failure: Secondary | ICD-10-CM | POA: Diagnosis not present

## 2022-12-05 DIAGNOSIS — K449 Diaphragmatic hernia without obstruction or gangrene: Secondary | ICD-10-CM | POA: Diagnosis not present

## 2022-12-05 DIAGNOSIS — K209 Esophagitis, unspecified without bleeding: Secondary | ICD-10-CM | POA: Diagnosis not present

## 2022-12-05 DIAGNOSIS — N186 End stage renal disease: Secondary | ICD-10-CM

## 2022-12-05 DIAGNOSIS — Z992 Dependence on renal dialysis: Secondary | ICD-10-CM

## 2022-12-05 DIAGNOSIS — F17210 Nicotine dependence, cigarettes, uncomplicated: Secondary | ICD-10-CM

## 2022-12-05 HISTORY — PX: ESOPHAGOGASTRODUODENOSCOPY: SHX5428

## 2022-12-05 HISTORY — PX: BIOPSY: SHX5522

## 2022-12-05 LAB — CBC: Platelets: 219 10*3/uL (ref 150–400)

## 2022-12-05 LAB — GLUCOSE, CAPILLARY
Glucose-Capillary: 70 mg/dL (ref 70–99)
Glucose-Capillary: 84 mg/dL (ref 70–99)

## 2022-12-05 SURGERY — EGD (ESOPHAGOGASTRODUODENOSCOPY)
Anesthesia: Monitor Anesthesia Care

## 2022-12-05 MED ORDER — PROPOFOL 500 MG/50ML IV EMUL
INTRAVENOUS | Status: DC | PRN
  Administered 2022-12-05: 175 ug/kg/min via INTRAVENOUS

## 2022-12-05 MED ORDER — OXYCODONE HCL 5 MG/5ML PO SOLN: 5.0000 mg | Freq: Once | ORAL | Status: DC | PRN

## 2022-12-05 MED ORDER — FENTANYL CITRATE (PF) 100 MCG/2ML IJ SOLN: 25.0000 ug | INTRAMUSCULAR | Status: DC | PRN

## 2022-12-05 MED ORDER — METOCLOPRAMIDE HCL 5 MG PO TABS: 5.0000 mg | ORAL_TABLET | Freq: Four times a day (QID) | ORAL | 0 refills | Status: DC | PRN

## 2022-12-05 MED ORDER — OXYCODONE HCL 5 MG PO TABS: 5.0000 mg | ORAL_TABLET | Freq: Once | ORAL | Status: DC | PRN

## 2022-12-05 MED ORDER — LIDOCAINE 2% (20 MG/ML) 5 ML SYRINGE
INTRAMUSCULAR | Status: DC | PRN
  Administered 2022-12-05: 70 mg via INTRAVENOUS

## 2022-12-05 MED ORDER — LABETALOL HCL 100 MG PO TABS: 100.0000 mg | ORAL_TABLET | Freq: Three times a day (TID) | ORAL | 0 refills | Status: DC

## 2022-12-05 MED ORDER — PANTOPRAZOLE SODIUM 40 MG PO TBEC: 40.0000 mg | DELAYED_RELEASE_TABLET | Freq: Two times a day (BID) | ORAL | 0 refills | Status: DC

## 2022-12-05 MED ORDER — DEXAMETHASONE SODIUM PHOSPHATE 10 MG/ML IJ SOLN
INTRAMUSCULAR | Status: DC | PRN
  Administered 2022-12-05: 5 mg via INTRAVENOUS

## 2022-12-05 MED ORDER — PROPOFOL 10 MG/ML IV BOLUS
INTRAVENOUS | Status: DC | PRN
Start: 2022-12-05 — End: 2022-12-05
  Administered 2022-12-05: 50 mg via INTRAVENOUS
  Administered 2022-12-05 (×2): 150 mg via INTRAVENOUS

## 2022-12-05 MED ORDER — ONDANSETRON HCL 4 MG/2ML IJ SOLN: 4.0000 mg | Freq: Four times a day (QID) | INTRAMUSCULAR | Status: DC | PRN

## 2022-12-05 NOTE — Op Note (Signed)
[facility] Patient Name: [name] Procedure Date : 12/05/2022 MRN: [id number] Attending MD: Dub Amis. Tomasa Rand , MD, [id number] Date of Birth: 1986/01/05 CSN: [id number] Age: 37 Admit Type: Inpatient Procedure:                Upper GI endoscopy Indications:              Hematemesis Providers:                Lorin Picket E. Tomasa Rand, MD, Doristine Mango, RN,                            Priscella Mann, Technician Referring MD:              Medicines:                Monitored Anesthesia Care Complications:            No immediate complications. Estimated Blood Loss:     Estimated blood loss was minimal. Procedure:                Pre-Anesthesia Assessment:                           - Prior to the procedure, a History and Physical                            was performed, and patient medications and                            allergies were reviewed. The patient's tolerance of                            previous anesthesia was also reviewed. The risks                            and benefits of the procedure and the sedation                            options and risks were discussed with the patient.                            All questions were answered, and informed consent                            was obtained. Prior Anticoagulants: The patient has                            taken no anticoagulant or antiplatelet agents. ASA                            Grade Assessment: III - A patient with severe                            systemic disease. After reviewing the risks and  benefits, the patient was deemed in satisfactory                            condition to undergo the procedure.                           After obtaining informed consent, the endoscope was                            passed under direct vision. Throughout the                            procedure, the patient's blood pressure, pulse, and                            oxygen  saturations were monitored continuously. The                            [id number]-[id number] ([id number]) Olympus endoscope was introduced                            through the mouth, and advanced to the second part                            of duodenum. The upper GI endoscopy was                            accomplished without difficulty. The patient                            tolerated the procedure well. Scope In: Scope Out: Findings:      The examined portions of the nasopharynx, oropharynx and larynx were       normal.      Mildly severe esophagitis with no bleeding was found at the       gastroesophageal junction.      The exam of the esophagus was otherwise normal.      A few dispersed diminutive erosions/superficial linear and punctate       ulcers with scant bleeding were found in the gastric body and in the       gastric antrum. Biopsies were taken with a cold forceps for Helicobacter       pylori testing. Estimated blood loss was minimal.      A 3 cm hiatal hernia was present.      The exam of the stomach was otherwise normal.      The examined duodenum was normal. Impression:               - The examined portions of the nasopharynx,                            oropharynx and larynx were normal.                           - Mildly severe esophagitis with no bleeding.                           -  Erosive gastropathy with scant oozing. Biopsied.                            This is the most likely source of the patient's                            hematemesis.                           - 3 cm hiatal hernia.                           - Normal examined duodenum. Moderate Sedation:      N/A Recommendation:           - Return patient to hospital ward for possible                            discharge same day.                           - Resume previous diet.                           - Use Protonix (pantoprazole) 40 mg PO BID for 4                            weeks.                           -  Await pathology results.                           - Avoid NSAIDs and tobacco products indefinitely                           - GI will sign off at this time. Procedure Code(s):        --- Professional ---                           [phone], Esophagogastroduodenoscopy, flexible,                            transoral; with biopsy, single or multiple Diagnosis Code(s):        --- Professional ---                           K20.90, Esophagitis, unspecified without bleeding                           K31.89, Other diseases of stomach and duodenum                           K44.9, Diaphragmatic hernia without obstruction or                            gangrene  K92.0, Hematemesis CPT copyright 2022 American Medical Association. All rights reserved. The codes documented in this report are preliminary and upon coder review may  be revised to meet current compliance requirements. Taylyn Brame E. Tomasa Rand, MD 12/05/2022 11:27:32 AM This report has been signed electronically. Number of Addenda: 0

## 2022-12-05 NOTE — Progress Notes (Addendum)
Per nursing, patient upset he did not get to eat yesterday-missed dinner during dialysis and declined snacks from nursing prior to midnight. He woke up very hungry and upset about not eating. EGD is scheduled for 12 PM today. Should be okay to eat small meal/snack now and be NPO after this as this will be around 10 hours prior to scheduled procedure. Discussed plan with RN.  Addendum: patient finished eating at 2:15 AM-will remain NPO after this

## 2022-12-05 NOTE — Transfer of Care (Signed)
Immediate Anesthesia Transfer of Care Note  Patient: [name]  Procedure(s) Performed: ESOPHAGOGASTRODUODENOSCOPY (EGD) BIOPSY  Patient Location: PACU  Anesthesia Type:MAC  Level of Consciousness: drowsy  Airway & Oxygen Therapy: Patient Spontanous Breathing and Patient connected to face mask oxygen  Post-op Assessment: Report given to RN and Post -op Vital signs reviewed and stable  Post vital signs: Reviewed and stable  Last Vitals:  Vitals Value Taken Time  BP 198/90 12/05/22 1130  Temp    Pulse 98 12/05/22 1131  Resp 19 12/05/22 1131  SpO2 100 % 12/05/22 1131  Vitals shown include unfiled device data.  Last Pain:  Vitals:   12/05/22 1023  TempSrc: Temporal  PainSc: 0-No pain      Patients Stated Pain Goal: 2 (12/04/22 1736)  Complications: No notable events documented.

## 2022-12-05 NOTE — Interval H&P Note (Signed)
History and Physical Interval Note:  12/05/2022 10:56 AM  [name]  has presented today for surgery, with the diagnosis of Hematemesis.  The various methods of treatment have been discussed with the patient and family. After consideration of risks, benefits and other options for treatment, the patient has consented to  Procedure(s): ESOPHAGOGASTRODUODENOSCOPY (EGD) (N/A) as a surgical intervention.  The patient's history has been reviewed, patient examined, no change in status, stable for surgery.  I have reviewed the patient's chart and labs.  Questions were answered to the patient's satisfaction.     [name]

## 2022-12-05 NOTE — Discharge Instructions (Addendum)
Dear [name],  Thank you for letting us participate in your care. You were hospitalized for rectal bleeding. You underwent an endoscopy which showed gastric irritation which may have lead to your bleeding. You will need to continue to take oral acid reducer medication twice daily to reduce your stomach's irritation.  For your headaches you should take your blood pressure medications.    DOCTOR'S APPOINTMENT   Future Appointments  Date Time Provider Department Center  12/08/2022  1:30 PM ACCESS TO CARE POOL [facility]  12/10/2022 10:00 AM Isaiah Blakes, LCSW [facility] None  01/30/2023  1:30 PM Louanne Skye Devoria Albe., NP [facility]  02/11/2023  1:20 PM Danis, Andreas Blower, MD [facility]     Take care and be well!  Family Medicine Teaching Service Inpatient Team [place]  [facility]  [address] [place], [place] [address] [phone]

## 2022-12-05 NOTE — Consult Note (Signed)
   [facility] CM Inpatient Consult   12/05/2022  [name] 04-16-1986 [id number]  [facility] [[facility]]  Accountable Care Organization [ACO] Patient: Medicare ACO REACH  Primary Care Provider:  [name], DO   Patient is currently active with [facility] [[facility]] Care Management for chronic disease management services.  Patient has been engaged by a [employer] [CC].  Our community based plan of care has focused on disease management and community resource support.    3:40 pm Met with patient at bedside regarding on going follow up. Patient states that his phone is messed up and that he is wanting all communication to go through his mother-in-law, [name],[name]  phone number [phone].  Patient getting back from procedure, Endo.  States he had a severe migraine most of yesterday and felt that he is better today. Patient acknowledges new PCP at [facility] as PCP.  Patient will receive a post hospital call and will be evaluated for assessments and disease process education.    Plan:  Will update Community RN CC of information received for follow up.  Of note, [facility] Care Management services does not replace or interfere with any services that are needed or arranged by inpatient [facility] care management team.   For additional questions or referrals please contact:  Charlesetta Shanks, RN BSN CCM [facility]  [phone] business mobile phone Toll free office [phone]  *Concierge Line  [phone] Fax number: [phone] [email or web address]

## 2022-12-05 NOTE — Discharge Summary (Addendum)
Family Medicine Teaching [facility] Discharge Summary  Patient name: [name] Medical record number: [id number] Date of birth: 26-Nov-1985 Age: 37 y.o. Gender: male Date of Admission: 12/03/2022  Date of Discharge: 12/05/22  Admitting Physician: Fortunato Curling, DO  Primary Care Provider: [name], DO Consultants: GI, Nephro  Indication for Hospitalization: Hematemesis and Hematochezia  Brief Hospital Course:  [name] is a 37 y.o.male with a history of Alport Syndrome, ESRD on HD (TTS), resistant  who was admitted to the [facility] Medicine Teaching Service at [facility] for hematemesis and hematochezia. His hospital course is detailed below:  Hematemesis  Hematochezia Patient presented from [facility] office due to reported hematochezia and hematemesis. In the ED, his vital signs were stable and Hgb stable at 7.4 with no electrolyte abnormalities.  He was started on PO Protonix 40 mg and IV Reglan 10 mg. GI was consulted and reccommended EGD and increasing Protonix to twice daily. EGD revealed mildly severe esophagitis with no bleeding, 3 cm hiatal hernia, and erosive gastropathy with scant oozing which was biopsied and is most likely the source of his hematemesis.  GI recommended Protonix 40 mg PO BID for 4 weeks with outpatient follow-up.  His hemoglobin was trended and remained stable with last hemoglobin 7.4 prior to discharge.  Resistant Hypertension Patient presented with elevated SBP 190-210s which remain chronically elevated outpatient.  He was started on his home medications of Amlodipine 10 mg daily, Clonidine 0.1 mg daily, Hydralazine 100 mg TID, Labetalol 100 mg BID, and Losartan 100 mg daily.  Patient reported poor side effects to clonidine and refused medication.  His labetalol was increased to 100 mg 3 times daily prior to discharge.  Upon discharge his blood pressure remains elevated with last reading of 182/102, but patient denied any headache, blurry vision, and felt  ready to go home.  Patient remains stable at his home baseline blood pressures upon discharge -recommended continuing clonidine outpatient for further control of blood pressure.  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  He was given 1 dose of IV iron prior to discharge.  Headache On admission and throughout his hospitalization patient complains of severe headaches.  Home medication include Imitrex and Excedrin Migraine.  Imitrex was discontinued due to severe hypertension.  PO steroids, Reglan and Benadryl were attempted, with some relief, but his symptoms reoccur frequently.  Neurology was curb sided while he was hospitalized and recommended outpatient follow-up with migraine clinic at [facility] neurology for further headache workup and potential migraine prophylaxis.  Referral placed at discharge.   PCP Follow-up Recommendations: Recommend that patient completes 4 weeks of Protonix. Advise patient to avoid Triptans and Excedrin for migraines in the setting of ESRD and uncontrolled HTN. Consider migraine prophylaxis We recommend he takes all prescribed BP medications upon discharge due to this resistant HTN. Discuss the importance of taking his medications and make modifications as they seem appropriate. Seems to have a mixed picture of Iron Deficiency Anemia and Anemia of Chronic Disease, consider iron supplementation outpatient. Consider hydrocortisone perianal cream for hemorrhoids to help avoid episodes of hematochezia.    Discharge Diagnoses/Problem List:  Principal Problem:   Acute GI bleeding Active Problems:   Anemia   Headache   GI bleed   ESRD on dialysis [facility])  Disposition: Home  Discharge Condition: Stable  Discharge Exam:  General: NAD,  awake and alert, headache ENTM: deaf d/t Alport Syndrome, wears  hearing aids Cardiovascular: RRR. No M/R/G Respiratory: CTAB, normal WOB on RA. No wheezing, crackles, or rhonchi. Abdomen: soft, non-tender, non-distended. Bowel sounds normoactive Extremities: no BLE edema Neuro: A&Ox3. No focal neurological deficits. Psych: normal mood Extremities: RUE fistula with aneurysm, no BLE edema  Significant Procedures: EGD  Significant Labs and Imaging:  Recent Labs  Lab 12/04/22 0800 12/04/22 2118 12/04/22 2356  WBC 7.3 7.8 8.5  HGB 7.2* 7.3* 7.4*  HCT 21.9* 22.4* 22.7*  PLT 221 252 219   Recent Labs  Lab 12/04/22 0800  NA 137  K 4.6  CL 92*  CO2 27  GLUCOSE 86  BUN 58*  CREATININE 11.71*  CALCIUM 9.5  PHOS 7.3*  ALBUMIN 3.3*    Results/Tests Pending at Time of Discharge: none  Discharge Medications:  Allergies as of 12/05/2022       Reactions   Zestril [lisinopril] Swelling   Nsaids Other (See Comments)   "Kidney problems "   Risperdal [risperidone] Other (See Comments)   Unknown reaction    Carvedilol Other (See Comments)   Makes mouth numb and makes blood sugar drop        Medication List     STOP taking these medications    triamcinolone ointment 0.1 % Commonly known as: KENALOG       TAKE these medications    acetaminophen 325 MG tablet Commonly known as: TYLENOL Take 2 tablets (650 mg total) by mouth every 6 (six) hours as needed for mild pain (or Fever >/= 101).   amLODipine 10 MG tablet Commonly known as: NORVASC TAKE 1 TABLET BY MOUTH EVERY DAY   calcium acetate 667 MG capsule Commonly known as: PHOSLO Take 1 capsule (667 mg total) by mouth 3 (three) times daily with meals.   cloNIDine 0.1 MG tablet Commonly known as: CATAPRES Take 1 tablet (0.1 mg total) by mouth 2 (two) times daily.   divalproex 500 MG 24 hr tablet Commonly known as: DEPAKOTE ER Take 1 tablet (500 mg total) by mouth at bedtime.   hydrALAZINE 100 MG tablet Commonly known as: APRESOLINE Take 1 tablet (100 mg total) by mouth  3 (three) times daily.   labetalol 100 MG tablet Commonly known as: NORMODYNE Take 1 tablet (100 mg total) by mouth 3 (three) times daily. What changed: when to take this   losartan 100 MG tablet Commonly known as: COZAAR Take 1 tablet (100 mg total) by mouth daily.   metoCLOPramide 5 MG tablet Commonly known as: REGLAN Take 1 tablet (5 mg total) by mouth every 6 (six) hours as needed for nausea (headache).   pantoprazole 40 MG tablet Commonly known as: PROTONIX Take 1 tablet (40 mg total) by mouth 2 (two) times daily.        Discharge Instructions: Please refer to Patient Instructions section of EMR for full details.  Patient was counseled important signs and symptoms that should prompt return to medical care, changes in medications, dietary instructions, activity restrictions, and follow up appointments.   Follow-Up Appointments:   Glendale Chard, DO 12/05/2022, 6:31 PM PGY-2, [facility]

## 2022-12-05 NOTE — Progress Notes (Signed)
Spoke with Dr. Wilford Corner to discuss migraine/headache management for this patient.   We are still unable to determine exact etiology of this patient's headaches as he has severe hypertension and medication overuse.   He recommends outpatient follow up with Dr. Naomie Dean at [facility] Neurology to further characterize the headaches and determine if they are actually migraines. Patient will need to be established outpatient to start prophylactic therapy.   Discussed utility of occipital block while he was inpatient - Dr. Wilford Corner reports this is usually used to treat occipital neuralgia and would not be very effective for migraines. He reports the only subcutaneous injectable effective for migraines would be Botox.   Appreciate Dr. Bess Harvest guidance in management. Will work to coordinate outpatient follow up with Dr. Trevor Mace migraine clinic.   Glendale Chard, DO [facility], PGY-2 12/05/22 2:02 PM

## 2022-12-05 NOTE — Assessment & Plan Note (Signed)
2/2  acute blood loss v chronic disease.  Iron studies Fe 45 (on the lower end of normal), TIBC 298, and ferritin elevated to 340. Seems to be a mixed picture of iron deficiency anemia and anemia of chronic disease. - CBC AM  - Low threshold to recheck earlier if begins having episodes of bleeding  - Transfusion <7 - IV Ferrlecit 125 mg at 1108mL/hr on TTS with HD

## 2022-12-05 NOTE — Anesthesia Preprocedure Evaluation (Signed)
Anesthesia Evaluation  Patient identified by MRN, date of birth, ID band Patient awake    Reviewed: Allergy & Precautions, H&P , NPO status , Patient's Chart, lab work & pertinent test results  Airway Mallampati: II   Neck ROM: full    Dental   Pulmonary Current Smoker and Patient abstained from smoking.   breath sounds clear to auscultation       Cardiovascular hypertension, +CHF   Rhythm:regular Rate:Normal     Neuro/Psych  Headaches PSYCHIATRIC DISORDERS  Depression Bipolar Disorder      GI/Hepatic ,GERD  ,,hematemesis   Endo/Other    Renal/GU ESRF and DialysisRenal disease     Musculoskeletal   Abdominal   Peds  Hematology  (+) Blood dyscrasia, anemia   Anesthesia Other Findings   Reproductive/Obstetrics                             Anesthesia Physical Anesthesia Plan  ASA: 3  Anesthesia Plan: MAC   Post-op Pain Management:    Induction: Intravenous  PONV Risk Score and Plan: 0 and Propofol infusion, Midazolam and Treatment may vary due to age or medical condition  Airway Management Planned: Nasal Cannula  Additional Equipment:   Intra-op Plan:   Post-operative Plan:   Informed Consent: I have reviewed the patients History and Physical, chart, labs and discussed the procedure including the risks, benefits and alternatives for the proposed anesthesia with the patient or authorized representative who has indicated his/her understanding and acceptance.     Dental advisory given  Plan Discussed with: CRNA, Anesthesiologist and Surgeon  Anesthesia Plan Comments:        Anesthesia Quick Evaluation

## 2022-12-05 NOTE — Plan of Care (Signed)

## 2022-12-05 NOTE — Assessment & Plan Note (Signed)
Upon admission it could have been due to dehydration from vomiting, bloody bowel movements, elevated BP, and h/o migraines. Previously on Imitrex for migraines but was Dc'd at pt's request and will continue to avoid due to possibility of increasing his BP. He has been using Excedrin at home, but was advised to avoid. His headaches could be due to elevated BP vs. Medication overuse headaches.  - Consider Compazine and Benadryl to manage his headaches. - PO Reglan and Benadryl to manage his headaches - Tylenol  - Consider IV compazine if uncontrolled

## 2022-12-06 ENCOUNTER — Telehealth (HOSPITAL_COMMUNITY): Payer: Self-pay | Admitting: Nephrology

## 2022-12-06 NOTE — Telephone Encounter (Signed)
Transition of care contact from inpatient facility  Date of Discharge: 12/05/22 Date of Contact:12/06/22 - attempted Method of contact: Phone  Attempted to contact patient to discuss transition of care from inpatient admission using the phone number listed in his chart. Automated message that this phone is out of service.  Will attempt to reach him with outpatient HD this week.  Ozzie Hoyle, PA-C [employer] Pager [phone]

## 2022-12-06 NOTE — Anesthesia Postprocedure Evaluation (Signed)
Anesthesia Post Note  Patient: [name]  Procedure(s) Performed: ESOPHAGOGASTRODUODENOSCOPY (EGD) BIOPSY     Patient location during evaluation: PACU Anesthesia Type: MAC Level of consciousness: awake and alert Pain management: pain level controlled Vital Signs Assessment: post-procedure vital signs reviewed and stable Respiratory status: spontaneous breathing, nonlabored ventilation, respiratory function stable and patient connected to nasal cannula oxygen Cardiovascular status: stable and blood pressure returned to baseline Postop Assessment: no apparent nausea or vomiting Anesthetic complications: no   No notable events documented.  Last Vitals:  Vitals:   12/05/22 1532 12/05/22 1750  BP: (!) 197/113 (!) 182/102  Pulse: 100   Resp: 16   Temp: 36.9 C   SpO2: 100%     Last Pain:  Vitals:   12/05/22 1532  TempSrc: Oral  PainSc:                  [name] S

## 2022-12-06 NOTE — Plan of Care (Signed)
[facility] Patient Discharge Orders - [facility] CLINIC: [place]  Patient's name: [name] Admit/DC Dates: 12/03/2022 - 12/05/2022  DISCHARGE DIAGNOSES: Hematemesis HTN Anemia  HD ORDER CHANGES: Heparin change: YES - pls HOLD EDW Change: no  [place]ath Change: no  ANEMIA MANAGEMENT: Aranesp: Given: no   ESA dose for discharge: mircera IV q 2 weeks - start 8/1 IV Iron dose at discharge: per prot Transfusion: Given: no  BONE/MINERAL MEDICATIONS: Hectorol/Calcitriol change: no Sensipar/Parsabiv change: no  ACCESS INTERVENTION/CHANGE: no Details:   RECENT LABS: Recent Labs  Lab 12/04/22 0800 12/04/22 2118 12/04/22 2356  HGB 7.2*   < > 7.4*  NA 137  --   --   K 4.6  --   --   CALCIUM 9.5  --   --   PHOS 7.3*  --   --   ALBUMIN 3.3*  --   --    < > = values in this interval not displayed.    IV ANTIBIOTICS: no Details:  OTHER ANTICOAGULATION: On Coumadin?: no  OTHER/APPTS/LAB ORDERS:  1 - Will be on PPI -> make sure was able to pick up and start  2 - Requesting referral to [facility] vascular surgery for further permanent AVF  D/C Meds to be reconciled by nurse after every discharge.  Completed By: Ozzie Hoyle, PA-C [facility] Pager [phone]    Reviewed by: MD:______ RN_______

## 2022-12-08 ENCOUNTER — Encounter (HOSPITAL_COMMUNITY): Payer: Self-pay | Admitting: Gastroenterology

## 2022-12-08 ENCOUNTER — Inpatient Hospital Stay: Payer: Self-pay

## 2022-12-08 ENCOUNTER — Telehealth: Payer: Self-pay

## 2022-12-08 NOTE — Progress Notes (Signed)
Late Note Entry- December 08, 2022  Pt was d/c on Friday afternoon/evening. Contacted [facility] this morning to make clinic aware of pt's d/c date in case pt did not make Saturday appt.   Olivia Canter Renal Navigator [phone]

## 2022-12-08 NOTE — Progress Notes (Deleted)
  SUBJECTIVE:   CHIEF COMPLAINT / HPI:   Presents today for hospital follow-up regarding GI bleed in which patient was admitted from 7/24-26.  PCP Follow-up Recommendations: Recommend that patient completes 4 weeks of Protonix 8/23) and then switch to H2 blocker or PPI daily.  He has scheduled GI follow-up on 02/11/2023. Advise patient to avoid Triptans and Excedrin for migraines in the setting of ESRD and uncontrolled HTN. Consider migraine prophylaxis, neurology referral We recommend he takes all prescribed BP medications upon discharge due to this resistant HTN. Discuss the importance of taking his medications and make modifications as they seem appropriate. Seems to have a mixed picture of Iron Deficiency Anemia and Anemia of Chronic Disease, consider iron supplementation outpatient. Consider hydrocortisone perianal cream for hemorrhoids to help avoid episodes of hematochezia.   PERTINENT  PMH / PSH: Alport syndrome, ESRD on HD, resistant hypertension  Patient Care Team: Edward Alley, DO as PCP - General (Family Medicine) Corky Crafts, MD as PCP - Cardiology (Cardiology) Randa Spike Kelton Pillar, LCSW as [facility] Care Management (Licensed Clinical Social Worker) Juanell Fairly, RN as [facility] Care Management OBJECTIVE:  There were no vitals taken for this visit. Physical Exam   ASSESSMENT/PLAN:  There are no diagnoses linked to this encounter. No follow-ups on file. Shelby Mattocks, DO 12/08/2022, 12:43 PM PGY-***, [facility] Family [facility] {    This will disappear when note is signed, click to select method of visit    :1}

## 2022-12-08 NOTE — Transitions of Care (Post Inpatient/ED Visit) (Signed)
   12/08/2022  Name: [name] MRN: [id number] DOB: 1986-04-25  Today's TOC FU Call Status: Today's TOC FU Call Status:: Unsuccessul Call (1st Attempt) Unsuccessful Call (1st Attempt) Date: 12/08/22  Attempted to reach the patient regarding the most recent Inpatient/ED visit.  Follow Up Plan: Additional outreach attempts will be made to reach the patient to complete the Transitions of Care (Post Inpatient/ED visit) call.   [name], RN, BSN, CCM Care Management Coordinator [place]/[facility] Phone: [phone]/Fax: [phone]

## 2022-12-09 ENCOUNTER — Inpatient Hospital Stay: Payer: Medicare Other

## 2022-12-09 ENCOUNTER — Telehealth: Payer: Self-pay

## 2022-12-09 NOTE — Transitions of Care (Post Inpatient/ED Visit) (Signed)
   12/09/2022  Name: ISAACK PERSINGER MRN: [id number] DOB: 04-07-1986  Today's TOC FU Call Status: Today's TOC FU Call Status:: Unsuccessful Call (2nd Attempt) Unsuccessful Call (2nd Attempt) Date: 12/09/22  Attempted to reach the patient regarding the most recent Inpatient/ED visit.  Follow Up Plan: Additional outreach attempts will be made to reach the patient to complete the Transitions of Care (Post Inpatient/ED visit) call.   [name], RN, BSN, CCM Care Management Coordinator [place]/[facility]

## 2022-12-09 NOTE — Progress Notes (Deleted)
  SUBJECTIVE:   CHIEF COMPLAINT / HPI:   Hospital Follow Up:  Presents today for hospital follow-up regarding GI bleed in which patient was admitted from 7/24-26.   Per Discharge Summary PCP Follow-up Recommendations: Recommend that patient completes 4 weeks of Protonix 8/23 and then switch to H2 blocker or PPI daily.  He has scheduled GI follow-up on 02/11/2023. Advise patient to avoid Triptans and Excedrin for migraines in the setting of ESRD and uncontrolled HTN. Consider migraine prophylaxis, neurology referral We recommend he takes all prescribed BP medications upon discharge due to this resistant HTN. Discuss the importance of taking his medications and make modifications as they seem appropriate. Seems to have a mixed picture of Iron Deficiency Anemia and Anemia of Chronic Disease, consider iron supplementation outpatient. Consider hydrocortisone perianal cream for hemorrhoids to help avoid episodes of hematochezia.   PERTINENT  PMH / PSH: Alport syndrome, ESRD on HD, resistant hypertension  Patient Care Team: [name], DO as PCP - General (Family Medicine) Corky Crafts, MD as PCP - Cardiology (Cardiology) Randa Spike Kelton Pillar, LCSW as [facility] Care Management (Licensed Clinical Social Worker) Juanell Fairly, RN as [facility] Care Management OBJECTIVE:  There were no vitals taken for this visit. Physical Exam   ASSESSMENT/PLAN:  There are no diagnoses linked to this encounter. No follow-ups on file. Alfredo Martinez, MD 12/09/2022, 1:57 PM PGY-3, [facility] Family Medicine {    This will disappear when note is signed, click to select method of visit    :1}

## 2022-12-10 ENCOUNTER — Ambulatory Visit: Payer: Self-pay | Admitting: Licensed Clinical Social Worker

## 2022-12-10 ENCOUNTER — Telehealth: Payer: Self-pay

## 2022-12-10 NOTE — Patient Instructions (Signed)
Visit Information  Thank you for taking time to visit with me today. Please don't hesitate to contact me if I can be of assistance to you.   Following are the goals we discussed today:   Goals Addressed             This Visit's Progress    patient has stress related to managing his medical needs       Interventions:  Spoke via phone with [name], mother in law, contact for client, about client needs Discussed program support [name] said client is residing now with a cousin temporarily; client is trying to apply for apartment where he can live on his own. [name] said client is going to dialysis regularly now.  [name] said client has been communicating with kidney transplant team at [facility]. Team wants client to come into compliance with several areas of his heath at this time. [name] spoke of these areas of compliance. Spoke of client medication procurement and adherence  Discussed mood of client. Client is anxious about medical issues faced.  Client and his wife are separated now. His spouse has filed for divorce per [name], mother in law Mentioned to [name] that she may get a call this afternoon from nurse, [name] to consult about client nurisng needs. [name] is aware of this information Discussed financial challenges of client Discussed that client does not want to go to shelter due to risk of getting an infection Thanked [name] for phone call today with LCSW. Encouraged client or [name] call LCSW as needed for SW support for client at [phone].  [name] was appreciative of call from LCSW today        Our next appointment is by telephone on 02/02/23 at 2:00 PM   Please call the care guide team at [phone] if you need to cancel or reschedule your appointment.   If you are experiencing a Mental Health or Behavioral Health Crisis or need someone to talk to, please go to [facility] [address], [place] [phone])   The patient/ [name], mother in law and client contact,  verbalized understanding of instructions, educational materials, and care plan provided today and DECLINED offer to receive copy of patient instructions, educational materials, and care plan.   The patient/ [name], mother in law and client contact, has been provided with contact information for the care management team and has been advised to call with any health related questions or concerns.   Kelton Pillar.Jamita Mckelvin MSW, LCSW Licensed Visual merchandiser [facility] Care Management [phone]

## 2022-12-10 NOTE — Transitions of Care (Post Inpatient/ED Visit) (Signed)
   12/10/2022  Name: [name] MRN: [id number] DOB: 04/10/1986  Today's TOC FU Call Status: Today's TOC FU Call Status:: Unsuccessful Call (3rd Attempt) Unsuccessful Call (3rd Attempt) Date: 12/10/22  Attempted to reach the patient regarding the most recent Inpatient/ED visit. Message left on [employer] (emergency contact) with return call information.  Follow Up Plan: No further outreach attempts will be made at this time. We have been unable to contact the patient.  [name], RN, BSN, CCM Care Management Coordinator [place]/[facility] Phone: [phone]/Fax: [phone]

## 2022-12-10 NOTE — Patient Outreach (Signed)
  Care Coordination   Follow Up Visit Note   12/10/2022 Name: [name] MRN: [id number] DOB: 1985-12-13  [name] is a 37 y.o. year old male who sees Edward Alley, DO for primary care. I spoke with  [name] / [name], mother in law and client contact by phone today.  What matters to the patients health and wellness today? Patient has stress related to managing medical needs     Goals Addressed             This Visit's Progress    patient has stress related to managing his medical needs       Interventions:  Spoke via phone with [name], mother in law, contact for client, about client needs Discussed program support [name] said client is residing now with a cousin temporarily; client is trying to apply for apartment where he can live on his own. [name] said client is going to dialysis regularly now.  [name] said client has been communicating with kidney transplant team at [facility] Team wants client to come into compliance with several areas of his heath at this time. [name] spoke of these areas of compliance. Spoke of client medication procurement and adherence  Discussed mood of client. Client is anxious about medical issues faced.  Client and his wife are separated now. His spouse has filed for divorce per [name], mother in law Mentioned to [name] that she may get a call this afternoon from nurse, [name] to consult about client nurisng needs. [name] is aware of this information Discussed financial challenges of client Discussed that client does not want to go to shelter due to risk of getting an infection Thanked [name] for phone call today with LCSW. Encouraged client or [name] call LCSW as needed for SW support for client at [phone].  [name] was appreciative of call from LCSW today        SDOH assessments and interventions completed:  Yes  SDOH Interventions Today    Flowsheet Row Most  Recent Value  SDOH Interventions   Depression Interventions/Treatment  Counseling  Physical Activity Interventions Other (Comments)  [may have mobility issues]  Stress Interventions Other (Comment)  [has stress related to managing medical needs]        Care Coordination Interventions:  Yes, provided   Interventions Today    Flowsheet Row Most Recent Value  Chronic Disease   Chronic disease during today's visit Other  [spoke with [name], mother in law, about client needs]  General Interventions   General Interventions Discussed/Reviewed General Interventions Discussed, [employer]  [discussed program support]  Exercise Interventions   Exercise Discussed/Reviewed Physical Activity  Education Interventions   Education Provided Provided Education  Provided Verbal Education On [employer]  Mental Health Interventions   Mental Health Discussed/Reviewed Coping Strategies  [discussed mood of client.  Client is anxious regarding his medical needs]  Pharmacy Interventions   Pharmacy Dicussed/Reviewed Pharmacy Topics Discussed        Follow up plan: Follow up call scheduled for 02/02/23 at 2:00 PM     Encounter Outcome:  Pt. Visit Completed   [name].[name] MSW, LCSW Licensed Visual merchandiser [facility] Care Management [phone]

## 2022-12-16 ENCOUNTER — Telehealth: Payer: Self-pay | Admitting: *Deleted

## 2022-12-16 NOTE — Progress Notes (Signed)
[name],  The biopsies taken from your stomach were notable for mild reactive gastropathy which is a common finding and often related to use of certain medications (usually NSAIDs), but there was no evidence of Helicobacter pylori infection. This common finding is not felt to necessarily be a cause of any particular symptom and there is no specific treatment or further evaluation recommended. Please continue to take the Protonix twice daily as recommended for 4 weeks and avoid all NSAIDs indefinitely.

## 2022-12-16 NOTE — Progress Notes (Signed)
  Care Coordination Note  12/16/2022 Name: AAVION JELKS MRN: [id number] DOB: 06-08-1985  [name] is a 37 y.o. year old male who is a primary care patient of [name], DO and is actively engaged with the care management team. I reached out to [employer] by phone today to assist with re-scheduling a follow up visit with the RN Case Manager  Follow up plan: Unsuccessful telephone outreach attempt made.   [facility]  Care Coordination Care Guide  Direct Dial: [phone]

## 2022-12-19 ENCOUNTER — Ambulatory Visit: Payer: Self-pay

## 2022-12-19 NOTE — Patient Outreach (Signed)
  Care Coordination   Initial Visit Note   12/19/2022 Name: [name] MRN: [id number] DOB: [birth year]  [name] is a 37 y.o. year old male who sees Edward Alley, DO for primary care. I spoke with [name]  What matters to the patients health and wellness today?  [name] stated that she was at a doctors appointment and could not talk at the time she would call me back.    SDOH assessments and interventions completed:  No     Care Coordination Interventions:  No, not indicated   Follow Up Plan:  12/25/22 3 pm  Encounter Outcome:  Pt. Visit Completed   Edward Fairly RN, BSN, [facility] Care Coordinator [facility]   Phone: [phone]

## 2022-12-25 ENCOUNTER — Ambulatory Visit: Payer: Self-pay

## 2022-12-25 NOTE — Patient Instructions (Signed)
Visit Information  Thank you for taking time to visit with me today. Please don't hesitate to contact me if I can be of assistance to you.   Following are the goals we discussed today:   Goals Addressed             This Visit's Progress    I have a lot of medical needs to navigate.       Patient Goals/Self-Care Activities: Last practice recorded BP readings:  BP Readings from Last 3 Encounters:  12/05/22 (!) 182/102  12/03/22 (!) 189/104  11/27/22 (!) 196/104   Most recent eGFR/CrCl:  Lab Results  Component Value Date   EGFR 4 (L) 06/16/2022    No components found for: "CRCL"  Evaluation of current treatment plan related to hypertension self management and patient's adherence to plan as established by provider;   Discussed plans with patient for ongoing care management follow up and provided patient with direct contact information for care management team; Take medications as prescribed   Attend all scheduled provider appointments Call pharmacy for medication refills 3-7 days in advance of running out of medications Perform all self care activities independently  Call provider office for new concerns or questions             Our next appointment is by telephone on 12/31/22 at 11 am  Please call the care guide team at [phone] if you need to cancel or reschedule your appointment.   If you are experiencing a Mental Health or Behavioral Health Crisis or need someone to talk to, please call 1-800-273-TALK (toll free, 24 hour hotline)  Patient verbalizes understanding of instructions and care plan provided today and agrees to view in MyChart. Active MyChart status and patient understanding of how to access instructions and care plan via MyChart confirmed with patient.     Juanell Fairly RN, BSN, [facility] Care Coordinator [facility]   Phone: [phone]

## 2022-12-25 NOTE — Patient Outreach (Signed)
  Care Coordination   Initial Visit Note   12/25/2022 Name: [name] MRN: [id number] DOB: [birth year]  [name] is a 37 y.o. year old male who sees Edward Alley, DO for primary care. I spoke with  [name] mother-in-law by phone today.  What matters to the patients health and wellness today?  Today, I had a conversation with Mrs. [name], who is the patient's mother-in-law. [name] gave [place] permission to talk to with her about the patient. Mrs. 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 Additionally, he is currently looking for a place to stay and has been moving around. Currently, he is staying with a family member, but only temporarily. He is also undergoing dialysis. Although I couldn't discuss his medications with Mrs. [name], she informed me that he now has a phone, and she provided his number as [phone]. I called the patient and left him a message, and I will try to contact him again soon.     Goals Addressed             This Visit's Progress    I have a lot of medical needs to navigate.       Patient Goals/Self-Care Activities: Last practice recorded BP readings:  BP Readings from Last 3 Encounters:  12/05/22 (!) 182/102  12/03/22 (!) 189/104  11/27/22 (!) 196/104   Most recent eGFR/CrCl:  Lab Results  Component Value Date   EGFR 4 (L) 06/16/2022    No components found for: "CRCL"  Evaluation of current treatment plan related to hypertension self management and patient's adherence to plan as established by provider;   Discussed plans with patient for ongoing care management follow up and provided patient with direct contact information for care management team; Take medications as prescribed   Attend all scheduled provider appointments Call pharmacy for medication refills 3-7 days in advance of running out of medications Perform all self care activities independently  Call provider office for new  concerns or questions             SDOH assessments and interventions completed:  No      Care Coordination Interventions:  Yes, provided   Follow up plan: Follow up call scheduled for 12/31/22  11 am    Encounter Outcome:  Pt. Visit Completed   Edward Fairly RN, BSN, [facility] Care Coordinator [facility]   Phone: [phone]

## 2022-12-27 ENCOUNTER — Other Ambulatory Visit: Payer: Self-pay | Admitting: Nurse Practitioner

## 2022-12-31 ENCOUNTER — Ambulatory Visit: Payer: Self-pay

## 2022-12-31 NOTE — Patient Outreach (Signed)
Care Coordination   Follow Up Visit Note   12/31/2022 Name: [name] MRN: [id number] DOB: May 25, 1985  [name] is a 37 y.o. year old male who sees [name], DO for primary care. I spoke with  [name] by phone today.  What matters to the patients health and wellness today?  [name] spoke with me and stated that he did not want any assistance at that time. [name] is helping him find a place to stay, and he wants to work on his health with his physician. I advised him that if he changes his mind and decides he needs help, he can feel free to contact us, and we will be happy to help.    Goals Addressed             This Visit's Progress    COMPLETED: I have a lot of medical needs to navigate.       Patient Goals/Self-Care Activities: Last practice recorded BP readings:  BP Readings from Last 3 Encounters:  12/05/22 (!) 182/102  12/03/22 (!) 189/104  11/27/22 (!) 196/104   Most recent eGFR/CrCl:  Lab Results  Component Value Date   EGFR 4 (L) 06/16/2022    No components found for: "CRCL"  Evaluation of current treatment plan related to hypertension self management and patient's adherence to plan as established by provider;   Discussed plans with patient for ongoing care management follow up and provided patient with direct contact information for care management team; Take medications as prescribed   Attend all scheduled provider appointments Call pharmacy for medication refills 3-7 days in advance of running out of medications Perform all self care activities independently  Call provider office for new concerns or questions          COMPLETED: Patient Stated he had recent arm surgery. he spoke of pain issues in his arm       Interventions  Spoke with client about client needs Discussed program support with client Client and LCSW spoke of client concerns over recent care received by client at hospital.  Client plans to call Hospital to talk  with Patient Care and discuss care issues of his recent stays at hospital. Client said a person at hospital visited him while he was at hospital to try to talk with him about care concerns Discussed pain issues of client. Discussed recent arm surgery of client Provided counseling support for client Used Active Listening Techniques  with client to hear client concerns at this time. Client had many concerns about recent hospital care received by client Spoke of job loss of client. Client spoke of dialysis treatments received. He is trying to get on kidney transplant  list He has car but it needs repairs.  Car is not working at present. Client spoke of financial issues. He gets Education officer, environmental. Client and wife are separated. Client has financial challenges.  Spoke of food needs. He has family that helps him occasionally with food needs. 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 He said he has adequate food.  Discussed support of program staff.  Discussed with client that RN was scheduled to call client in a few weeks to discuss nursing needs of client.  Encouraged client to call LCSW as needed for SW support at [phone].             SDOH assessments and interventions completed:  No  Care Coordination Interventions:  No, not indicated   Follow up plan: No further intervention required.   Encounter Outcome:  Pt. Visit Completed   Juanell Fairly RN, BSN, [employer] [employer] Phone: [phone]

## 2022-12-31 NOTE — Patient Instructions (Signed)
Visit Information  Thank you for taking time to visit with me today. Please don't hesitate to contact me if I can be of assistance to you.   Following are the goals we discussed today:   Goals Addressed             This Visit's Progress    COMPLETED: I have a lot of medical needs to navigate.       Patient Goals/Self-Care Activities: Last practice recorded BP readings:  BP Readings from Last 3 Encounters:  12/05/22 (!) 182/102  12/03/22 (!) 189/104  11/27/22 (!) 196/104   Most recent eGFR/CrCl:  Lab Results  Component Value Date   EGFR 4 (L) 06/16/2022    No components found for: "CRCL"  Evaluation of current treatment plan related to hypertension self management and patient's adherence to plan as established by provider;   Discussed plans with patient for ongoing care management follow up and provided patient with direct contact information for care management team; Take medications as prescribed   Attend all scheduled provider appointments Call pharmacy for medication refills 3-7 days in advance of running out of medications Perform all self care activities independently  Call provider office for new concerns or questions          COMPLETED: Patient Stated he had recent arm surgery. he spoke of pain issues in his arm       Interventions  Spoke with client about client needs Discussed program support with client Client and LCSW spoke of client concerns over recent care received by client at hospital.  Client plans to call Hospital to talk with Patient Care and discuss care issues of his recent stays at hospital. Client said a person at hospital visited him while he was at hospital to try to talk with him about care concerns Discussed pain issues of client. Discussed recent arm surgery of client Provided counseling support for client Used Active Listening Techniques  with client to hear client concerns at this time. Client had many concerns about recent hospital care  received by client Spoke of job loss of client. Client spoke of dialysis treatments received. He is trying to get on kidney transplant  list He has car but it needs repairs.  Car is not working at present. Client spoke of financial issues. He gets Education officer, environmental. Client and wife are separated. Client has financial challenges.  Spoke of food needs. He has family that helps him occasionally with food needs. 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 He said he has adequate food.  Discussed support of program staff.  Discussed with client that RN was scheduled to call client in a few weeks to discuss nursing needs of client.  Encouraged client to call LCSW as needed for SW support at [phone].              If you are experiencing a Mental Health or Behavioral Health Crisis or need someone to talk to, please call 1-800-273-TALK (toll free, 24 hour hotline)  Patient verbalizes understanding of instructions and care plan provided today and agrees to view in MyChart. Active MyChart status and patient understanding of how to access instructions and care plan via MyChart confirmed with patient.     No further follow up required  Juanell Fairly RN, BSN, [facility]   Care Coordinator Phone: [phone]

## 2023-01-01 ENCOUNTER — Other Ambulatory Visit: Payer: Self-pay | Admitting: Student

## 2023-01-13 ENCOUNTER — Telehealth: Payer: Self-pay

## 2023-01-13 NOTE — Transitions of Care (Post Inpatient/ED Visit) (Signed)
   01/13/2023  Name: [name] MRN: [id number] DOB: 23-Feb-1986  Today's TOC FU Call Status: Today's TOC FU Call Status:: Unsuccessful Call (1st Attempt) Unsuccessful Call (1st Attempt) Date: 01/13/23  Attempted to reach the patient regarding the most recent Inpatient/ED visit.  Follow Up Plan: Additional outreach attempts will be made to reach the patient to complete the Transitions of Care (Post Inpatient/ED visit) call.   [name] RN, BSN, CCM [facility] RN Care Coordinator/ Transitions of Care Direct Dial: [phone]  Fax: [phone]

## 2023-01-14 ENCOUNTER — Telehealth: Payer: Self-pay

## 2023-01-14 NOTE — Transitions of Care (Post Inpatient/ED Visit) (Signed)
01/14/2023  Name: [name] MRN: [id number] DOB: 1986/02/19  Today's TOC FU Call Status: Today's TOC FU Call Status:: Successful TOC FU Call Completed TOC FU Call Complete Date: 01/14/23 Patient's Name and Date of Birth confirmed.  Transition Care Management Follow-up Telephone Call Date of Discharge: 01/09/23 Discharge Facility: Other ([facility]) Name of Other ([facility]) Discharge Facility: [facility] Type of Discharge: Inpatient Admission Primary Inpatient Discharge Diagnosis:: Right Upper ESRD Fistula Infection How have you been since you were released from the hospital?: Better (Patient is trying to keep his blood pressure under control so he can get a kidney transplant.  He is currently going through a divorce) Any questions or concerns?: No  Items Reviewed: Did you receive and understand the discharge instructions provided?: Yes Medications obtained,verified, and reconciled?: Yes (Medications Reviewed) Any new allergies since your discharge?: No Dietary orders reviewed?: No Do you have support at home?: Yes  Medications Reviewed Today: Medications Reviewed Today     Reviewed by [name], RN (Case Manager) on 01/14/23 at 1026  Med List Status: <None>   Medication Order Taking? Sig Documenting Provider Last Dose Status Informant  acetaminophen (TYLENOL) 325 MG tablet [id number] Yes Take 2 tablets (650 mg total) by mouth every 6 (six) hours as needed for mild pain (or Fever >/= 101). Lockie Mola, MD Taking Active Self, Pharmacy Records  amLODipine (NORVASC) 10 MG tablet [id number] Yes TAKE 1 TABLET BY MOUTH EVERY DAY Sabino Dick, DO Taking Active Self, Pharmacy Records  calcium acetate (PHOSLO) 667 MG capsule [id number] Yes Take 1 capsule (667 mg total) by mouth 3 (three) times daily with meals. Vonna Drafts, MD Taking Active Self, Pharmacy Records  carvedilol (COREG) 25 MG tablet [id number] Yes 25 mg 2 (two) times daily with a meal. [provider] Taking Active   cloNIDine (CATAPRES) 0.1 MG tablet [id number] Yes Take 1 tablet (0.1 mg total) by mouth 2 (two) times daily. McDiarmid, Leighton Roach, MD Taking Active Self, Pharmacy Records  diltiazem [facility]) 300 MG 24 hr capsule [id number] Yes Take 1 capsule by mouth daily. [provider] Taking Active   divalproex (DEPAKOTE ER) 500 MG 24 hr tablet [id number] Yes Take 1 tablet (500 mg total) by mouth at bedtime. Sabino Dick, DO Taking Active Self, Pharmacy Records  hydrALAZINE (APRESOLINE) 100 MG tablet [id number] Yes TAKE 1 TABLET BY MOUTH 3 TIMES DAILY. Gaston Islam., NP Taking Active   labetalol (NORMODYNE) 100 MG tablet [id number] Yes TAKE 1 TABLET BY MOUTH 3 TIMES DAILY. [name], DO Taking Active   losartan (COZAAR) 100 MG tablet [id number] Yes Take 1 tablet (100 mg total) by mouth daily. Graceann Congress, PA-C Taking Active Self, Pharmacy Records  metoCLOPramide (REGLAN) 5 MG tablet [id number] Yes Take 1 tablet (5 mg total) by mouth every 6 (six) hours as needed for nausea (headache). Glendale Chard, DO Taking Active   pantoprazole (PROTONIX) 40 MG tablet [id number] Yes TAKE 1 TABLET BY MOUTH TWICE A Mallie Mussel, DO Taking Active             Home Care and Equipment/Supplies: Were Home Health Services Ordered?: No Any new equipment or medical supplies ordered?: No  Functional Questionnaire: Do you need assistance with bathing/showering or dressing?: No Do you need assistance with meal preparation?: No Do you need assistance with eating?: No Do you have difficulty maintaining continence: No Do you need assistance with getting out of bed/getting out of a chair/moving?: No Do you have difficulty managing or taking your  medications?: No  Follow up appointments reviewed: PCP Follow-up appointment confirmed?: Yes Date of PCP follow-up appointment?: 01/28/23 Follow-up Provider: Dr. Yetta Barre Specialist [facility] Follow-up appointment confirmed?: Yes Date of  Specialist follow-up appointment?: 01/30/23 Follow-Up Specialty Provider:: Dr. Louanne Skye Do you need transportation to your follow-up appointment?: No Do you understand care options if your condition(s) worsen?: Yes-patient verbalized understanding  SDOH Interventions Today    Flowsheet Row Most Recent Value  SDOH Interventions   Food Insecurity Interventions Intervention Not Indicated  Transportation Interventions Intervention Not Indicated      TOC Interventions Today    Flowsheet Row Most Recent Value  TOC Interventions   TOC Interventions Discussed/Reviewed TOC Interventions Discussed, TOC Interventions Reviewed, Arranged PCP follow up less than 12 days/Care Guide scheduled     [name] RN, BSN, CCM [facility] RN Care Coordinator/ Transitions of Care Direct Dial: [phone]  Fax: [phone]

## 2023-01-26 ENCOUNTER — Ambulatory Visit: Payer: Medicare Other

## 2023-01-28 ENCOUNTER — Ambulatory Visit (INDEPENDENT_AMBULATORY_CARE_PROVIDER_SITE_OTHER): Payer: Medicare Other | Admitting: Student

## 2023-01-28 ENCOUNTER — Encounter: Payer: Self-pay | Admitting: Student

## 2023-01-28 VITALS — BP 174/96 | HR 97 | Ht 69.0 in | Wt 170.6 lb

## 2023-01-28 DIAGNOSIS — R0600 Dyspnea, unspecified: Secondary | ICD-10-CM

## 2023-01-28 DIAGNOSIS — K219 Gastro-esophageal reflux disease without esophagitis: Secondary | ICD-10-CM | POA: Diagnosis not present

## 2023-01-28 DIAGNOSIS — I10 Essential (primary) hypertension: Secondary | ICD-10-CM | POA: Diagnosis present

## 2023-01-28 MED ORDER — PANTOPRAZOLE SODIUM 40 MG PO TBEC
40.0000 mg | DELAYED_RELEASE_TABLET | Freq: Two times a day (BID) | ORAL | 0 refills | Status: DC
Start: 2023-01-28 — End: 2023-04-04

## 2023-01-28 MED ORDER — HYDRALAZINE HCL 100 MG PO TABS
100.0000 mg | ORAL_TABLET | Freq: Three times a day (TID) | ORAL | 1 refills | Status: DC
Start: 2023-01-28 — End: 2023-04-04

## 2023-01-28 NOTE — Assessment & Plan Note (Signed)
Uncontrolled on 5 medications in setting of ESRD.  Patient advised to continue carvedilol 25 mg twice daily, diltiazem 300 mg daily, hydralazine 100 mg 3 times daily, losartan 100 mg daily and amlodipine 10 mg daily.  Reassuringly he is feeling well today with no concerns for hypertensive emergency.  He will need to follow-up with his nephrologist for better control. -f/u ASAP with Dr.Koval  -f/u with nephrology next HD appointment to discuss further

## 2023-01-28 NOTE — Progress Notes (Cosign Needed Addendum)
SUBJECTIVE:   CHIEF COMPLAINT / HPI:   Patient was recently hospitalized with [facility] from 8/22-8/30 for infected wound of AV fistula: June 2024-revision of AV fistula graft 12/12/2022-evacuation of right arm seroma 12/16/2022-evacuation of hematoma with fistula graft 01/02/2023-I&D for fistula infection He was treated with IV cefepime and vancomycin and discharged on cefepime with HD which was completed. Last saw vascular outpatient on 01/23/2023 their note states wounds have healed and graft is patent and can be used.  He was treated for hypertension during hospitalization, Coreg was increased, diltiazem started, and Norvasc was discontinued per hospital note however, pt states he was unaware and id still taking it.   He currently has HD on TTS, HD session was yesterday, 8/17.  They initially cannulated a VF but patient complained of numbness and cold hand so instead they use the tunneled catheter.  HTN Currently taking carvedilol 25 mg twice daily, diltiazem 300 mg daily, hydralazine 100 mg 3 times daily, losartan 100 mg daily, and amlodipine 10 mg daily. Unable to take labetalol and clonidine d/t previous reaction - shaking and make him very sleepy BP high today but pt states he just took his medications and they need to kick in. States he feels "perfect" today, no complaints.   SOB Complains of SOB when lying flat or on his side. Also SOB with exertion like walking up stairs.  Cannot sleep well at night - has to sit up to breath well. Admits to waking from sleep gasping for air, has to lear forward to get air.  Has had these symptoms for past year. Had TTE and chest CT during last hospitalization in August 2024 - LVEF 60-65%, small pericardial effusion, Chest CT showed small bilateral plural effusions.   PERTINENT  PMH / PSH: [place] syndrome, ESRD, bipolar 1 disorder, migraines   OBJECTIVE:   BP (!) 174/96   Pulse 97   Ht 5\' 9"  (1.753 m)   Wt 170 lb 9.6 oz (77.4 kg)    SpO2 100%   BMI 25.19 kg/m    General: NAD, pleasant, able to participate in exam Cardiac: RRR Respiratory: CTAB, normal effort, No wheezes, rales or rhonchi Extremities: 2+ pitting edema BLEs Skin: warm and dry Neuro: alert, no obvious focal deficits Psych: Normal affect and mood  ASSESSMENT/PLAN:   Severe uncontrolled hypertension Uncontrolled on 5 medications in setting of ESRD.  Patient advised to continue carvedilol 25 mg twice daily, diltiazem 300 mg daily, hydralazine 100 mg 3 times daily, losartan 100 mg daily and amlodipine 10 mg daily.  Reassuringly he is feeling well today with no concerns for hypertensive emergency.  He will need to follow-up with his nephrologist for better control. -f/u ASAP with Dr.Koval  -f/u with nephrology next HD appointment to discuss further  Dyspnea Ongoing for past year.  Recent TTE with normal LVEF.  Small pericardial effusion noted as well as chest CT with small bilateral pleural effusions.  Unsure if these could be contributing to his dyspnea on exertion, orthopnea and possibly PND.  He had spirometry evaluation with Dr. Raymondo Band 03/24/2022 with normal lung function.  Of note, does have 2+ pitting edema of BLEs-could be multifactorial and likely due to fluid overload in the setting of ESRD.  Reassuringly, he is breathing comfortably on room air with clear lung sounds and SpO2 of 100% today. I am concerned for cardiac etiology.  Patient already follows with a cardiologist but does not recall telling his cardiologist of the symptoms.  He has an  appointment for 9/20.  I have advised him to notify his cardiologist of the symptoms for further evaluation.   Refills of hydralazine and Protonix for GERD sent to pharmacy  Dr. [name], DO [facility] [facility]

## 2023-01-28 NOTE — Assessment & Plan Note (Addendum)
Ongoing for past year.  Recent TTE with normal LVEF.  Small pericardial effusion noted as well as chest CT with small bilateral pleural effusions.  Unsure if these could be contributing to his dyspnea on exertion, orthopnea and possibly PND.  He had spirometry evaluation with Dr. Raymondo Band 03/24/2022 with normal lung function.  Of note, does have 2+ pitting edema of BLEs-could be multifactorial and likely due to fluid overload in the setting of ESRD.  Reassuringly, he is breathing comfortably on room air with clear lung sounds and SpO2 of 100% today. I am concerned for cardiac etiology.  Patient already follows with a cardiologist but does not recall telling his cardiologist of the symptoms.  He has an appointment for 9/20.  I have advised him to notify his cardiologist of the symptoms for further evaluation.

## 2023-01-28 NOTE — Progress Notes (Signed)
Cardiology Office Note    Patient Name: [name] Date of Encounter: 01/30/2023  Primary Care Provider:  [name], DO Primary Cardiologist:  Lance Muss, MD Primary Electrophysiologist: None   Past Medical History    Past Medical History:  Diagnosis Date   Bipolar 1 disorder (HCC)    Depression    ESRD on hemodialysis (HCC)    HD at [facility] on TTS schedule   GERD (gastroesophageal reflux disease)    Hearing difficulty of left ear    75% hearing   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   Hypertension    Low blood sugar     History of Present Illness   [name] is a 37 y.o. male with PMH of ESRD on HD (Tues,Thurs, Sat), HTN, HFrEF, HTN, GERD, Bipolar and congential hearing loss who presents today for posthospital follow-up.  Mr. Knicely was seen initially 02/2022 with complaint of chest pain and elevated BP.  Troponins which were found to be secondary to uncontrolled HTN.  2D echo was completed showing reduced EF of 40-45% moderate pericardial effusion and no evidence of tamponade with mild MR.  He was seen by Dr. Eldridge Dace on 04/18/2022 with complaint of elevated BP.  He was found to be noncompliant with hydralazine for few days and systolic blood pressures were in the 170s/180s.  He was encouraged to restart hydralazine referred to [facility] for BP management.  He was seen last on 05/09/2022 and blood pressures were still elevated and poorly controlled.  He was increased to  hydralazine 100 mg 3 times daily Coreg 25 mg and losartan 100 mg amlodipine 10 mg.  He was scheduled to follow-up with Pharm.D. in 3 weeks for further titration but was lost to follow-up.  On 06/03/2022 with abdominal pain and found to have gastritis and was discharged 2 days later.  He was seen on 09/17/2022 with complaint of shortness of breath and chest pain.  Troponin trended flat and EKG was without ischemia.  Patient also endorsed burning sensation consistent with GERD and declined Protonix  with resolution of discomfort following dialysis.  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  He was recently hospitalized at [facility] with infected wound, fistula.  During today's visit the patient reports that he has been experiencing increased shortness of breath that occurs primarily when lying flat and with activity.  He is also having what he describes as heaviness in his chest when he walks upstairs or increases his activity his blood pressures are elevated at 160/100 initially and 164/92 on recheck.  He reports compliance with his current medication regimen and denies any adverse reactions.  He continues to be dialyzed on Tuesdays, Thursdays, Saturday currently has a dialysis catheter in place due to ongoing problems with his AV fistula.  He also reports drinking over 32 ounces of water daily and drinks and eats over 64 ounces of ice in addition to his 32 ounces.  We had a long discussion regarding the need to reduce his fluid intake to help with his shortness of breath especially with lying flat..  Patient denies palpitations, nausea, vomiting, dizziness, syncope, edema, weight gain, or early satiety.   Review of Systems  Please see the history of present illness.    All other systems reviewed and are otherwise negative except as noted above.  Physical Exam    Wt Readings from Last 3 Encounters:  01/30/23 173 lb 9.6 oz (78.7 kg)  01/28/23  170 lb 9.6 oz (77.4 kg)  12/05/22 169 lb 15.6 oz (77.1 kg)   VS: Vitals:   01/30/23 1341 01/30/23 1412  BP: (!) 160/100 (!) 164/92  Pulse: 85   SpO2: 98%   ,Body mass index is 25.64 kg/m. GEN: Well nourished, well developed in no acute distress Neck: No JVD; No carotid bruits Pulmonary: Clear to auscultation without rales, wheezing or rhonchi  Cardiovascular: Normal rate. Regular rhythm. Normal S1. Normal S2.   Murmurs: There is no murmur.  ABDOMEN: Soft, non-tender, non-distended EXTREMITIES:  No edema; No deformity    EKG/LABS/ Recent Cardiac Studies   ECG personally reviewed by me today -none completed today  Risk Assessment/Calculations:          Lab Results  Component Value Date   WBC 8.5 12/04/2022   HGB 7.4 (L) 12/04/2022   HCT 22.7 (L) 12/04/2022   MCV 96.2 12/04/2022   PLT 219 12/04/2022   Lab Results  Component Value Date   CREATININE 11.71 (H) 12/04/2022   BUN 58 (H) 12/04/2022   NA 137 12/04/2022   K 4.6 12/04/2022   CL 92 (L) 12/04/2022   CO2 27 12/04/2022   No results found for: "CHOL", "HDL", "LDLCALC", "LDLDIRECT", "TRIG", "CHOLHDL"  Lab Results  Component Value Date   HGBA1C 4.6 05/30/2022   Assessment & Plan    1.  Chest heaviness/shortness of breath: -Patient reports episodes of shortness of breath with exertion and chest heaviness with activity. -He will complete a ETT to evaluate for possible ischemia related to chest heaviness and shortness of breath. -Patient was advised to continue carvedilol 25 mg twice daily -He is also aware to seek guidance in the ED if chest pressure returns and is not relieved with rest.  2.Essential hypertension: -HYPERTENSION CONTROL Vitals:   01/30/23 1341 01/30/23 1412  BP: (!) 160/100 (!) 164/92    The patient's blood pressure is elevated above target today.  In order to address the patient's elevated BP: A referral to the [facility] Hypertension [facility] will be placed.; A referral to the Advanced Hypertension Clinic will be placed.; Blood pressure will be monitored at home to determine if medication changes need to be made.     -Patient advised to continue current and hypertensive regimen with Norvasc 10 mg daily, carvedilol 25 mg twice daily, Cardizem 300 mg daily, hydralazine 100 mg 3 times daily, losartan 100 mg daily  3.  High risk for infection: -Patient presented today with dialysis catheter undressed and exposed and reports that his dressings do not stick. 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 -Continue losartan 100 mg, carvedilol 25 mg twice daily -Patient was advised to reduce consumption of water to 32 ounces per his nephrologist -Low sodium diet, fluid restriction <2L, and daily weights encouraged. Educated to contact our office for weight gain of 2 lbs overnight or 5 lbs in one week.       Disposition: Follow-up with Lance Muss, MD or APP in 2 months Informed Consent   Shared Decision Making/Informed Consent The risks [chest pain, shortness of breath, cardiac arrhythmias, dizziness, blood pressure fluctuations, myocardial infarction, stroke/transient ischemic attack, and life-threatening complications (  estimated to be 1 in 10,000)], benefits (risk stratification, diagnosing coronary artery disease, treatment guidance) and alternatives of an exercise tolerance test were discussed in detail with Mr. Potempa and he agrees to proceed.      Signed, Napoleon Form, Leodis Rains, NP 01/30/2023, 4:54 PM [facility]

## 2023-01-28 NOTE — Patient Instructions (Signed)
It was great to see you! Thank you for allowing me to participate in your care!  I recommend that you always bring your medications to each appointment as this makes it easy to ensure you are on the correct medications and helps [place] not miss when refills are needed.  Our plans for today:  -Continue current blood pressure medications including Coreg, diltiazem, Norvasc, hydralazine, and losartan -Make an appointment on your way out with Dr. Raymondo Band -Return for a follow-up appointment with me in about a month -Please make an appointment with your nephrologist to discuss blood pressure control -I placed a referral to cardiology to evaluate your shortness of breath.  They will call you to schedule an appointment   Take care and seek immediate care sooner if you develop any concerns.   Dr. [name], DO [facility]

## 2023-01-29 ENCOUNTER — Encounter: Payer: Self-pay | Admitting: Student

## 2023-01-30 ENCOUNTER — Encounter: Payer: Self-pay | Admitting: Nurse Practitioner

## 2023-01-30 ENCOUNTER — Ambulatory Visit: Payer: Medicare Other | Attending: Nurse Practitioner | Admitting: Nurse Practitioner

## 2023-01-30 VITALS — BP 164/92 | HR 85 | Ht 69.0 in | Wt 173.6 lb

## 2023-01-30 DIAGNOSIS — Z9189 Other specified personal risk factors, not elsewhere classified: Secondary | ICD-10-CM | POA: Diagnosis present

## 2023-01-30 DIAGNOSIS — I5020 Unspecified systolic (congestive) heart failure: Secondary | ICD-10-CM | POA: Insufficient documentation

## 2023-01-30 DIAGNOSIS — N186 End stage renal disease: Secondary | ICD-10-CM | POA: Insufficient documentation

## 2023-01-30 DIAGNOSIS — R079 Chest pain, unspecified: Secondary | ICD-10-CM | POA: Insufficient documentation

## 2023-01-30 DIAGNOSIS — Z720 Tobacco use: Secondary | ICD-10-CM | POA: Diagnosis present

## 2023-01-30 DIAGNOSIS — I10 Essential (primary) hypertension: Secondary | ICD-10-CM | POA: Insufficient documentation

## 2023-01-30 NOTE — Patient Instructions (Signed)
Medication Instructions:  Your physician recommends that you continue on your current medications as directed. Please refer to the Current Medication list given to you today.  *If you need a refill on your cardiac medications before your next appointment, please call your pharmacy*   Lab Work: None  If you have labs (blood work) drawn today and your tests are completely normal, you will receive your results only by: MyChart Message (if you have MyChart) OR A paper copy in the mail If you have any lab test that is abnormal or we need to change your treatment, we will call you to review the results.   Testing/Procedures: Your provider exercise treadmill test.   Follow-Up: At [facility], you and your health needs are our priority.  As part of our continuing mission to provide you with exceptional heart care, we have created designated Provider Care Teams.  These Care Teams include your primary Cardiologist (physician) and Advanced Practice Providers (APPs -  Physician Assistants and Nurse Practitioners) who all work together to provide you with the care you need, when you need it.  We recommend signing up for the patient portal called "MyChart".  Sign up information is provided on this After Visit Summary.  MyChart is used to connect with patients for Virtual Visits (Telemedicine).  Patients are able to view lab/test results, encounter notes, upcoming appointments, etc.  Non-urgent messages can be sent to your provider as well.   To learn more about what you can do with MyChart, go to [email or web address].    Your next appointment:   2 month(s)  Provider:   Robin Searing, NP         Other Instructions PLEASE FOLLOW UP WITH HYPERTENSION CLINIC.

## 2023-02-02 ENCOUNTER — Ambulatory Visit: Payer: Medicare Other | Admitting: Family Medicine

## 2023-02-02 ENCOUNTER — Ambulatory Visit: Payer: Self-pay | Admitting: Licensed Clinical Social Worker

## 2023-02-02 NOTE — Patient Outreach (Signed)
Care Coordination   Follow Up Visit Note   02/02/2023 Name: [name] MRN: [id number] DOB: [birth year]  [name] is a 37 y.o. year old male who sees [name], DO for primary care. I spoke with  [name] / [name], mother in law and contact for client, via  phone today.  What matters to the patients health and wellness today?  Patient has stress related to managing his medical needs     Goals Addressed             This Visit's Progress    patient has stress related to managing his medical needs       Interventions:  Spoke via phone with [name], mother in law, contact for client, about client needs. Discussed program support for client with RN, LCSW, Pharmacist.  Mentioned to [name] that RN [name] had called client offering nursing support through program . Reminded [name] that nursing support was available through program for client.   [name] said client continues to reside at home of client's cousin at this time. Client is trying to apply for apartment where he can live on his own. [name] said client is going to dialysis regularly now. Client goes to dialysis treatments on Tuesday, Thursdays and Saturdays. Client does drive himself to and from dialysis appointments Spoke of client medication procurement .   Discussed mood of client. Client is anxious about medical issues faced.  Client and his wife are separated now. His spouse has filed for divorce per [name], mother in law. Client is concerned about his kidney functions and is trying to comply with medical guidelines in order for him to be able to be added to kidney transplant list through [facility]. Discussed financial challenges of client. [name] said client does have financial challenges Thanked [name] for phone call today with LCSW. Encouraged client or [name] call LCSW as needed for SW support for client at [phone].  [name] was  appreciative of call from LCSW today           SDOH assessments and interventions completed:  Yes  SDOH Interventions Today    Flowsheet Row Most Recent Value  SDOH Interventions   Depression Interventions/Treatment  Counseling  Physical Activity Interventions Other (Comments)  [receives dialysis 3 times weekly. may fatigue sometimes in walking]  Stress Interventions Other (Comment)  [client is trying to get on a kidney transplant list through [facility] in [facility], ]        Care Coordination Interventions:  Yes, provided   Interventions Today    Flowsheet Row Most Recent Value  Chronic Disease   Chronic disease during today's visit Other  [spoke with [name], mother in law of client and contact for client, about client needs]  General Interventions   General Interventions Discussed/Reviewed General Interventions Discussed, Community Resources  Exercise Interventions   Exercise Discussed/Reviewed Physical Activity  Education Interventions   Education Provided Provided Education  Provided Verbal Education On [employer] goes to dialysis treatments 3 times weekly as scheduled. Client is trying to get on kidney transplant list at [facility] in [place], ]  Mental Health Interventions   Mental Health Discussed/Reviewed Coping Strategies  [client has stress related to kidney issues. He is trying to get on kidney transplant list at [facility] in [place], [place]. He has financial challenges. He has some housing challenges]  Nutrition Interventions   Nutrition Discussed/Reviewed Nutrition Discussed  Pharmacy Interventions   Pharmacy Dicussed/Reviewed Pharmacy Topics Discussed       Follow up plan: Follow up call scheduled for 03/24/23 at 11:00 AM    Encounter Outcome:  Patient Visit Completed   Kelton Pillar.Chaeli Judy MSW, LCSW Licensed Visual merchandiser [facility] Care Management [phone]

## 2023-02-02 NOTE — Patient Instructions (Signed)
Visit Information  Thank you for taking time to visit with me today. Please don't hesitate to contact me if I can be of assistance to you.   Following are the goals we discussed today:   Goals Addressed             This Visit's Progress    patient has stress related to managing his medical needs       Interventions:  Spoke via phone with [name], mother in law, contact for client, about client needs. Discussed program support for client with RN, LCSW, Pharmacist.  Mentioned to [name] that RN Juanell Fairly had called client offering nursing support through program . Reminded [name] that nursing support was available through program for client.   [name] said client continues to reside at home of client's cousin at this time. Client is trying to apply for apartment where he can live on his own. [name] said client is going to dialysis regularly now. Client goes to dialysis treatments on Tuesday, Thursdays and Saturdays. Client does drive himself to and from dialysis appointments Spoke of client medication procurement .   Discussed mood of client. Client is anxious about medical issues faced.  Client and his wife are separated now. His spouse has filed for divorce per [name], mother in law. Client is concerned about his kidney functions and is trying to comply with medical guidelines in order for him to be able to be added to kidney transplant list through [facility]. Discussed financial challenges of client. [name] said client does have financial challenges Thanked [name] for phone call today with LCSW. Encouraged client or [name] call LCSW as needed for SW support for client at [phone].  [name] was appreciative of call from LCSW today           Our next appointment is by telephone on 03/24/23 at 11:00 AM   Please call the care guide team at [phone] if you need to cancel or reschedule your appointment.   If you are  experiencing a Mental Health or Behavioral Health Crisis or need someone to talk to, please go to [facility] [address] [place] [phone])   The patient / [name], verbalized understanding of instructions, educational materials, and care plan provided today and DECLINED offer to receive copy of patient instructions, educational materials, and care plan.   The patient / [name], has been provided with contact information for the care management team and has been advised to call with any health related questions or concerns.   [name].[name] MSW, LCSW Licensed Visual merchandiser [facility] Care Management [phone]

## 2023-02-03 ENCOUNTER — Ambulatory Visit: Payer: Medicare Other

## 2023-02-03 VITALS — BP 160/103 | HR 88 | Ht 69.0 in | Wt 174.2 lb

## 2023-02-03 DIAGNOSIS — R0600 Dyspnea, unspecified: Secondary | ICD-10-CM

## 2023-02-03 NOTE — Progress Notes (Signed)
Ambulatory walk  95% 95BPM

## 2023-02-03 NOTE — Patient Instructions (Signed)
It was great to see you! Thank you for allowing me to participate in your care!  Our plans for today:  -We tested your oxygen when you walk around and it remained above 95% which is normal.  Please make sure to go to your cardiology exercise stress test next week. -Please go to your dialysis afternoon as this can help with your breathing. -Please schedule an appointment with Dr. Raymondo Band to repeat your lung testing.   Please arrive 15 minutes PRIOR to your next scheduled appointment time! If you do not, this affects OTHER patients' care.  Take care and seek immediate care sooner if you develop any concerns.   Celine Mans, MD, PGY-2 [facility] 10:23 AM 02/03/2023  [facility]

## 2023-02-03 NOTE — Progress Notes (Signed)
    SUBJECTIVE:   CHIEF COMPLAINT / HPI: dypsnea  Patient is here today with concern for his breathing.  Reports he is having difficulty breathing at night, and feels like he is suffocating.  He is having to sleep upright.  Any kind of exertion causes him to have increased work of breathing.  He was last seen on 01/28/2023 with Dr. Yetta Barre for this complaint after recent hospitalization for infected arm.  Currently undergoing cardiac workup with exercise stress test scheduled for next week.  Reports he is going to dialysis session as later today.  Patient would like prescription for home oxygen as this helps him while he is in dialysis.  PERTINENT  PMH / PSH: Uncontrolled HTN, HFrEF, ESRD  OBJECTIVE:   BP (!) 160/103   Pulse 88   Ht 5\' 9"  (1.753 m)   Wt 174 lb 4 oz (79 kg)   SpO2 98%   BMI 25.73 kg/m   General: NAD Neuro: A&O Cardiovascular: RRR, no murmurs, 1-2+ pitting peripheral edema Respiratory: normal WOB on RA, CTAB, sparse bibasilar crackles Extremities: Moving all 4 extremities equally  Ambulatory pulse ox, greater than 95%.  ASSESSMENT/PLAN:   Assessment & Plan Dyspnea, unspecified type Continued dyspnea on exertion and orthopnea ongoing over the past year, in the setting of ESRD on dialysis, HFrEF, uncontrolled hypertension.  Suspect that he has large component of volume overload that only improves with dialysis, there may also be component of heart failure however his last echo showed normal ejection fraction, unfortunately his volume status is only maintained via dialysis at this time and further volume management medications cannot be used.  While he does have some lower extremity edema today, he is reassuringly saturating at greater than 95% at rest and with ambulation and does not have increased work of breathing.  He is currently undergoing further cardiac workup with cardiology to evaluate his dyspnea.  Plan to follow-up with patient after his stress test at the  beginning of October.  Blood pressure today while elevated, is relatively controlled per patient history, and is not within hypertensive emergency range.  Can consider repeat PFT function with Dr. Raymondo Band if cardiac work-up is negative.  Return in about 1 week (around 02/10/2023).  Celine Mans, MD [facility]

## 2023-02-03 NOTE — Assessment & Plan Note (Signed)
Continued dyspnea on exertion and orthopnea ongoing over the past year, in the setting of ESRD on dialysis, HFrEF, uncontrolled hypertension.  Suspect that he has large component of volume overload that only improves with dialysis, there may also be component of heart failure however his last echo showed normal ejection fraction, unfortunately his volume status is only maintained via dialysis at this time and further volume management medications cannot be used.  While he does have some lower extremity edema today, he is reassuringly saturating at greater than 95% at rest and with ambulation and does not have increased work of breathing.  He is currently undergoing further cardiac workup with cardiology to evaluate his dyspnea.  Plan to follow-up with patient after his stress test at the beginning of October.  Blood pressure today while elevated, is relatively controlled per patient history, and is not within hypertensive emergency range.  Can consider repeat PFT function with Dr. Raymondo Band if cardiac work-up is negative.

## 2023-02-05 ENCOUNTER — Ambulatory Visit: Payer: Self-pay

## 2023-02-05 NOTE — Progress Notes (Deleted)
    SUBJECTIVE:   CHIEF COMPLAINT / HPI:   Seen by Posey Rea odays ago for dyspnea. Currently undergoing cardiology workup for DOE with chest heaviness. Has an exercise tolerance test scheduled with them.  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   PERTINENT  PMH / PSH: ***  OBJECTIVE:   There were no vitals taken for this visit.  ***  ASSESSMENT/PLAN:   No problem-specific Assessment & Plan notes found for this encounter.     Eliezer Loisel, MD [facility]

## 2023-02-10 ENCOUNTER — Ambulatory Visit: Payer: Medicare Other | Attending: Nurse Practitioner

## 2023-02-11 ENCOUNTER — Ambulatory Visit: Payer: Medicare Other | Admitting: Gastroenterology

## 2023-02-11 NOTE — Progress Notes (Deleted)
[facility] progress note:  History: [name] 02/11/2023  Referring provider: [name], DO  Reason for consult/chief complaint: No chief complaint on file.   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   Hypertension    Low blood sugar      Past Surgical History: Past Surgical History:  Procedure Laterality Date   A/V FISTULAGRAM Right 10/27/2022   Procedure: A/V Fistulagram;  Surgeon: Chuck Hint, MD;  Location: [facility] INVASIVE CV LAB;  Service: Cardiovascular;  Laterality: Right;   APPENDECTOMY     AV FISTULA PLACEMENT Left 03/03/2019   Procedure: ARTERIOVENOUS (AV) FISTULA CREATION LEFT ARM;  Surgeon: Maeola Harman, MD;  Location: [facility] OR;  Service: Vascular;  Laterality: Left;   BASCILIC VEIN TRANSPOSITION Right 04/12/2019   Procedure: RIGHT UPPER EXTREMITY BASCILIC VEIN TRANSPOSITION FIRST STAGE FISTULA;  Surgeon: Chuck Hint, MD;  Location: [facility] OR;  Service: Vascular;  Laterality: Right;   BIOPSY  10/14/2021   Procedure: BIOPSY;  Surgeon: Sherrilyn Rist, MD;  Location: [facility] ENDOSCOPY;  Service: Gastroenterology;;   BIOPSY  06/06/2022   Procedure: BIOPSY;  Surgeon: Napoleon Form, MD;  Location: [facility] ENDOSCOPY;  Service: Gastroenterology;;   BIOPSY  11/12/2022   Procedure: BIOPSY;  Surgeon: Lemar Lofty., MD;  Location: [facility] ENDOSCOPY;  Service: Gastroenterology;;   BIOPSY  12/05/2022   Procedure: BIOPSY;  Surgeon: [name], MD;  Location: [facility] ENDOSCOPY;  Service: Gastroenterology;;   COLONOSCOPY WITH PROPOFOL N/A 10/14/2021   Procedure: COLONOSCOPY WITH PROPOFOL;  Surgeon: Sherrilyn Rist, MD;  Location: [facility] ENDOSCOPY;  Service: Gastroenterology;  Laterality: N/A;   ESOPHAGOGASTRODUODENOSCOPY N/A 11/12/2022   Procedure: ESOPHAGOGASTRODUODENOSCOPY (EGD);  Surgeon: Lemar Lofty., MD;  Location: [facility] ENDOSCOPY;  Service: Gastroenterology;  Laterality: N/A;   ESOPHAGOGASTRODUODENOSCOPY N/A 12/05/2022   Procedure: ESOPHAGOGASTRODUODENOSCOPY (EGD);  Surgeon: [name], MD;  Location: [facility] ENDOSCOPY;  Service: Gastroenterology;  Laterality: N/A;   ESOPHAGOGASTRODUODENOSCOPY (EGD) WITH PROPOFOL N/A 06/06/2022   Procedure: ESOPHAGOGASTRODUODENOSCOPY (EGD) WITH PROPOFOL;  Surgeon: Napoleon Form, MD;  Location: [facility] ENDOSCOPY;  Service: Gastroenterology;  Laterality: N/A;   FISTULA SUPERFICIALIZATION Right 11/07/2022   Procedure: PLICATION OF LARGE ANEURYSMS OF RIGHT ARTERIOVENOUS FISTULA WITH INSERTION OF 7mm INTERPOSITIONAL GORTEX GRAFT;  Surgeon: Chuck Hint, MD;  Location: [facility] OR;  Service: Vascular;  Laterality: Right;   FLEXIBLE SIGMOIDOSCOPY N/A 04/14/2022   Procedure: FLEXIBLE SIGMOIDOSCOPY;  Surgeon: Napoleon Form, MD;  Location: [facility] ENDOSCOPY;  Service: Gastroenterology;  Laterality: N/A;   INSERTION OF DIALYSIS CATHETER N/A 11/07/2022   Procedure: INSERTION OF RIGHT INTERNAL JUGULAR TUNNELED DIALYSIS CATHETER;  Surgeon: Chuck Hint, MD;  Location: [facility] OR;  Service: Vascular;  Laterality: N/A;   LIGATION OF ARTERIOVENOUS  FISTULA Left 03/06/2019   Procedure: LIGATION OF ARTERIOVENOUS  FISTULA;  Surgeon: Cephus Shelling, MD;  Location: [facility] OR;  Service: Vascular;  Laterality: Left;   PERIPHERAL VASCULAR BALLOON ANGIOPLASTY  10/27/2022   Procedure: PERIPHERAL VASCULAR BALLOON ANGIOPLASTY;  Surgeon: Chuck Hint, MD;  Location: [facility] INVASIVE CV LAB;  Service: Cardiovascular;;  rt upper arm fistula   SAVORY DILATION N/A 11/12/2022   Procedure: SAVORY DILATION;  Surgeon: Meridee Score Netty Starring., MD;  Location: [facility] ENDOSCOPY;   Service: Gastroenterology;  Laterality: N/A;   spinal tap     SPINE SURGERY     related to a spinal infection, unsure of surgery or infection source   WISDOM TOOTH EXTRACTION       Family History: Family History  Problem Relation Age of Onset   Hypertension Mother    Kidney failure Mother    Diabetes Mother    Hearing loss Father    Hypertension Sister    Heart disease Maternal Grandmother    Stroke Maternal Grandfather    Asthma Son     Social History: Social History   Socioeconomic History   Marital status: Married    Spouse name: Not on file   Number of children: 3   Years of education: Not on file   Highest education level: Not on file  Occupational History   Occupation: HVAC  Tobacco Use   Smoking status: Every Day    Current packs/day: 0.00    Types: Cigarettes    Last attempt to quit: 02/27/2016    Years since quitting: 6.9   Smokeless tobacco: Never   Tobacco comments:    Returned to smoking 2 black and milds per day  Vaping Use   Vaping status: Never Used  Substance and Sexual Activity   Alcohol use: Not Currently    Comment: rare   Drug use: Yes    Types: Marijuana   Sexual activity: Not Currently  Other Topics Concern   Not on file  Social History Narrative   Not on file   Social Determinants of Health   Financial Resource Strain: Not on file  Food Insecurity: No Food Insecurity (01/14/2023)   Hunger Vital Sign    Worried About Running Out of Food in the Last Year: Never true    Ran Out of Food in the Last Year: Never true  Transportation Needs: No Transportation Needs (01/14/2023)   PRAPARE - Administrator, Civil Service (Medical): No    Lack of Transportation (Non-Medical): No  Physical Activity: Inactive (02/02/2023)   Exercise Vital Sign    Days of Exercise per Week: 0 days    Minutes of Exercise per Session: 0 min  Stress: Stress Concern Present (02/02/2023)   [employer] of Occupational Health - Occupational Stress  Questionnaire    Feeling of Stress : Rather much  Social Connections: Unknown (09/20/2021)   Received from [facility]   Social Network    Social Network: Not on file    Allergies: Allergies  Allergen Reactions   Zestril [Lisinopril] Swelling   Nsaids Other (See Comments)    "Kidney problems "   Risperdal [Risperidone] Other (See Comments)    Unknown reaction     Outpatient Meds: Current Outpatient Medications  Medication Sig Dispense Refill   amLODipine (NORVASC) 10 MG tablet TAKE 1 TABLET BY MOUTH EVERY DAY 90 tablet 3   calcium acetate (PHOSLO) 667 MG capsule Take 1 capsule (667 mg total) by mouth 3 (three) times daily with meals. 100 capsule 0   carvedilol (COREG) 25 MG tablet 25 mg 2 (two) times daily with a meal.     diltiazem (TIAZAC) 300 MG 24 hr capsule Take 1 capsule by mouth daily.     hydrALAZINE (APRESOLINE) 100 MG tablet Take 1 tablet (100 mg total) by  mouth 3 (three) times daily. 270 tablet 1   losartan (COZAAR) 100 MG tablet Take 1 tablet (100 mg total) by mouth daily. 30 tablet 1   Oxycodone HCl 10 MG TABS Take 1 tablet by mouth as needed.     pantoprazole (PROTONIX) 40 MG tablet Take 1 tablet (40 mg total) by mouth 2 (two) times daily. 180 tablet 0   No current facility-administered medications for this visit.      ___________________________________________________________________ Objective   Exam:  There were no vitals taken for this visit. Wt Readings from Last 3 Encounters:  02/03/23 174 lb 4 oz (79 kg)  01/30/23 173 lb 9.6 oz (78.7 kg)  01/28/23 170 lb 9.6 oz (77.4 kg)    General: ***  Eyes: sclera anicteric, no redness ENT: oral mucosa moist without lesions, no cervical or supraclavicular lymphadenopathy CV: ***, no JVD, no peripheral edema Resp: clear to auscultation bilaterally, normal RR and effort noted GI: soft, *** tenderness, with active bowel sounds. No guarding or palpable organomegaly noted. Skin; warm and dry, no  rash or jaundice noted Neuro: awake, alert and oriented x 3. Normal gross motor function and fluent speech  Labs:  11/12/22 endoscopic biopsy report  A. ESOPHAGUS, BIOPSY:       Squamous mucosa without significant diagnostic alteration.       Ne evidence of intraepithelial lymphocytes or eosinophils.   B. GE JUNCTION/CARDIA, BIOPSY:       Squamocolumnar mucosa with intestinal metaplasia.       Negative for dysplasia.       See comment.   C. STOMACH, BIOPSY:       Gastric oxyntic mucosa with focal mild chronic inactive gastritis.       Gastric antral mucosa with reactive gastropathy.       Negative for intestinal metaplasia or dysplasia.   IHC was negative for H. Pylori _______  12/05/2022 endoscopic biopsy report  A. STOMACH, BIOPSY:  Changes consistent with reactive gastropathy  No H. pylori seen on HE stain   ____________________________________ Radiologic Studies:  CLINICAL DATA:  Provided history: Dysphagia, idiopathic. Additional history provided: The patient reports chronic dysphagia with solid foods.   EXAM: ESOPHAGUS/BARIUM SWALLOW/TABLET STUDY   TECHNIQUE: A single contrast esophagram was performed using thin liquid barium. Additionally, the patient swallowed a 13 mm barium tablet under fluoroscopy. The exam was performed by Anders Grant, NP, and was supervised and interpreted by Dr. Jackey Loge.   FLUOROSCOPY: Radiation Exposure Index (as provided by the fluoroscopic device): 20.4 mGy Kerma   COMPARISON:  CT angiogram chest/abdomen/pelvis 09/23/2022.   FINDINGS: Limited examination due to limited ability to reposition the patient on the fluoroscopy table. Within this limitation, findings are as follows.   Somewhat narrowed appearance of the distal esophagus. A 13 mm barium tablet was delayed at this level (for approximately 5 minutes). The tablet eventually passed into the stomach with the patient taking liquid barium contrast in an attempt to  advance the pill.   Esophagus normal in caliber and smooth in contour elsewhere.   Mild intermittent esophageal dysmotility with tertiary contractions.   No appreciable hiatal hernia.   No gastroesophageal reflux was observed.   IMPRESSION: 1. Limited examination. 2. Somewhat narrowed appearance of the distal esophagus. A 13 mm barium tablet was delayed at this level (for approximately 5 minutes). The tablet eventually passed into the stomach with the patient taking liquid barium contrast in an attempt to advance the pill. Findings are concerning for a possible distal esophageal stricture,  and endoscopy should be considered for further evaluation. 3. Mild esophageal dysmotility with tertiary contractions.     Electronically Signed   By: Jackey Loge D.O.   On: 11/10/2022 11:14  Assessment: No diagnosis found.  ***  Plan:  ***  Thank you for the courtesy of this consult.  Please call me with any questions or concerns.  Charlie Pitter III  CC: Referring provider noted above

## 2023-02-17 ENCOUNTER — Ambulatory Visit: Payer: Medicare Other | Admitting: Student

## 2023-03-24 ENCOUNTER — Ambulatory Visit: Payer: Self-pay | Admitting: Licensed Clinical Social Worker

## 2023-03-24 NOTE — Patient Instructions (Signed)
Visit Information  Thank you for taking time to visit with me today. Please don't hesitate to contact me if I can be of assistance to you.   Following are the goals we discussed today:   Goals Addressed             This Visit's Progress    patient has stress related to managing his medical needs       Interventions:  Spoke via phone with [name], mother in law, contact for client, about client needs and current status Discussed program support for client with RN, LCSW, Pharmacist.  [name] said client resides now at home of his sister. [employer] is trying to help client locate an apartment where he can reside on his own [name] said client is going to dialysis regularly now. Client goes to dialysis treatments on Tuesday, Thursdays and Saturdays. Client does drive himself to and from dialysis appointments Spoke of client medication procurement .   Discussed mood of client. Client is anxious about medical issues faced.  He does go to dialysis weekly as scheduled. He is trying to get on kidney transplant list with [facility] in [place], [place], [place]  Also client is divorced and so social support is somewhat limited. He does have support from his mother in law [name] , who is patient contact Discussed pain issues of client. Discussed sleeping issues of client [name] said client has swelling in his arm and in his body.  Client has appointment tomorrow with medical provider to look at swelling issues, vein issues of client [name] said client is on Disability at present [name] said client had been at [facility] for 2 weeks and discharged from that SNF last Friday Feliciana Rossetti for phone call today with LCSW. Encouraged client or [name] call LCSW as needed for SW support for client at [phone].  [name] was appreciative of call from LCSW today           Our next appointment is by telephone on 05/04/23 at 11:00 AM   Please call the care guide team at  [phone] if you need to cancel or reschedule your appointment.   If you are experiencing a Mental Health or Behavioral Health Crisis or need someone to talk to, please go to [facility] [address] [place] [phone])   The patient / [name], contact for client, verbalized understanding of instructions, educational materials, and care plan provided today and DECLINED offer to receive copy of patient instructions, educational materials, and care plan.   The patient [name], contact for client, has been provided with contact information for the care management team and has been advised to call with any health related questions or concerns.    Kelton Pillar.Kennethia Lynes MSW, LCSW Licensed Visual merchandiser [facility] Care Management [phone]

## 2023-03-24 NOTE — Patient Outreach (Signed)
Care Coordination   Follow Up Visit Note   03/24/2023 Name: [name] MRN: [id number] DOB: [birth year]  [name] is a 37 y.o. year old male who sees [name], DO for primary care. I spoke with  Germaine Pomfret Tadros / [name], mother in law and contact for client via  phone today.  What matters to the patients health and wellness today?  Patient has stress related to managing medical needs     Goals Addressed             This Visit's Progress    patient has stress related to managing his medical needs       Interventions:  Spoke via phone with [name], mother in law, contact for client, about client needs and current status Discussed program support for client with RN, LCSW, Pharmacist.  [name] said client resides now at home of his sister. [employer] is trying to help client locate an apartment where he can reside on his own [name] said client is going to dialysis regularly now. Client goes to dialysis treatments on Tuesday, Thursdays and Saturdays. Client does drive himself to and from dialysis appointments Spoke of client medication procurement .   Discussed mood of client. Client is anxious about medical issues faced.  He does go to dialysis weekly as scheduled. He is trying to get on kidney transplant list with [facility] in [place], [place], [place]  Also client is divorced and so social support is somewhat limited. He does have support from his mother in law [name] , who is patient contact Discussed pain issues of client. Discussed sleeping issues of client [name] said client has swelling in his arm and in his body.  Client has appointment tomorrow with medical provider to look at swelling issues, vein issues of client [name] said client is on Disability at present [name] said client had been at [facility] for 2 weeks and discharged from that SNF last Friday Feliciana Rossetti for phone call today with LCSW. Encouraged client or [name] call LCSW as needed for SW support for client at [phone].  [name] was appreciative of call from LCSW today           SDOH assessments and interventions completed:  Yes  SDOH Interventions Today    Flowsheet Row Most Recent Value  SDOH Interventions   Depression Interventions/Treatment  Counseling  Physical Activity Interventions Other (Comments)  [fatigues easily,  on dialysis 3 times weekly.]  Stress Interventions Other (Comment)  [has stress in managing medical needs]        Care Coordination Interventions:  Yes, provided   Interventions Today    Flowsheet Row Most Recent Value  Chronic Disease   Chronic disease during today's visit Other  [spoke with [name], mother in law and contact for client, about client needs]  General Interventions   General Interventions Discussed/Reviewed General Interventions Discussed, Community Resources  Education Interventions   Education Provided Provided Education  Provided Verbal Education On [employer]  Mental Health Interventions   Mental Health Discussed/Reviewed Coping Strategies  [client is trying to manage stress issues faced. He is on dialysis treatments 3 times weekly. He is having some swelling issues]  Pharmacy Interventions   Pharmacy Dicussed/Reviewed Pharmacy Topics Discussed        Follow up plan: Follow up call scheduled for 05/04/23  at 11:00 AM    Encounter Outcome:  Patient Visit Completed   Kelton Pillar.Tashera Montalvo MSW, LCSW Licensed Clinical Social  Worker [facility] Care Management [phone]

## 2023-03-25 NOTE — Progress Notes (Deleted)
Cardiology Office Note    Patient Name: [name] Date of Encounter: 03/25/2023  Primary Care Provider:  [name], DO Primary Cardiologist:  Lance Muss, MD Primary Electrophysiologist: None   Past Medical History    Past Medical History:  Diagnosis Date   Bipolar 1 disorder (HCC)    Depression    ESRD on hemodialysis (HCC)    [facility]   GERD (gastroesophageal reflux disease)    Hearing difficulty of left ear    75% hearing   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   Hypertension    Low blood sugar     History of Present Illness  NALU LABA is a 37 y.o. male with PMH of ESRD on HD (Tues,Thurs, Sat), HTN, HFrEF, HTN, GERD, Bipolar and congential hearing loss who presents today for 17-month follow-up.  Mr. Scalzo was seen in 01/30/2023 for posthospital follow-up.  During follow-up visit patient's blood pressures were elevated he reported increased shortness of breath when lying flat and with activity.  He reported drinking over his allotted amount of fluid per nephrology and was advised to reduce his fluid intake to offset his shortness of breath.  During patient's visit was found to have exposed dialysis catheter in his right subclavian that was uncovered and open to air.  I provided a dressing change and advised patient to get sterile dressing supplies.  He was still completing workup for possible kidney transplant.  Patient was ordered an ETT to evaluate shortness of breath and to rule out ischemia that was not completed.   During today's visit the patient reports*** .  Patient denies chest pain, palpitations, dyspnea, PND, orthopnea, nausea, vomiting, dizziness, syncope, edema, weight gain, or early satiety.  ***Notes: -Last ischemic evaluation: -Last echo: -Interim ED visits: Review of Systems  Please see the history of present illness.    All other systems reviewed and are otherwise negative except as noted above.  Physical Exam     Wt Readings from Last 3 Encounters:  02/03/23 174 lb 4 oz (79 kg)  01/30/23 173 lb 9.6 oz (78.7 kg)  01/28/23 170 lb 9.6 oz (77.4 kg)   [facility]:GNFAO were no vitals filed for this visit.,There is no height or weight on file to calculate BMI. GEN: Well nourished, well developed in no acute distress Neck: No JVD; No carotid bruits Pulmonary: Clear to auscultation without rales, wheezing or rhonchi  Cardiovascular: Normal rate. Regular rhythm. Normal S1. Normal S2.   Murmurs: There is no murmur.  ABDOMEN: Soft, non-tender, non-distended EXTREMITIES:  No edema; No deformity   EKG/LABS/ Recent Cardiac Studies   ECG personally reviewed by me today - ***  Risk Assessment/Calculations:   {Does this patient have ATRIAL FIBRILLATION?:[phone]}      Lab Results  Component Value Date   WBC 8.5 12/04/2022   HGB 7.4 (L) 12/04/2022   HCT 22.7 (L) 12/04/2022   MCV 96.2 12/04/2022   PLT 219 12/04/2022   Lab Results  Component Value Date   CREATININE 11.71 (H) 12/04/2022   BUN 58 (H) 12/04/2022   NA 137 12/04/2022   K 4.6 12/04/2022   CL 92 (L) 12/04/2022   CO2 27 12/04/2022   No results found for: "CHOL", "HDL", "LDLCALC", "LDLDIRECT", "TRIG", "CHOLHDL"  Lab Results  Component Value Date   HGBA1C 4.6 05/30/2022   Assessment & Plan    1.  Chest heaviness/shortness of breath: -Patient reports episodes of shortness of breath with  exertion and chest heaviness with activity. -He will complete a ETT to evaluate for possible ischemia related to chest heaviness and shortness of breath. -Patient was advised to continue carvedilol 25 mg twice daily -He is also aware to seek guidance in the ED if chest pressure returns and is not relieved with rest.  2.  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 -Continue losartan 100 mg, carvedilol 25 mg twice daily -Patient was advised to reduce consumption of water to 32 ounces per his nephrologist      Disposition: Follow-up with Lance Muss, MD or APP in *** months {Are you ordering a CV Procedure (e.g. stress test, cath, DCCV, TEE, etc)?   Press F2        :[id number]}   Signed, Napoleon Form, Leodis Rains, NP 03/25/2023, 8:01 PM  [facility]

## 2023-03-26 ENCOUNTER — Ambulatory Visit: Payer: Medicare Other | Attending: Nurse Practitioner | Admitting: Nurse Practitioner

## 2023-03-27 ENCOUNTER — Emergency Department (HOSPITAL_COMMUNITY): Payer: Medicare Other

## 2023-03-27 ENCOUNTER — Inpatient Hospital Stay (HOSPITAL_COMMUNITY): Payer: Medicare Other

## 2023-03-27 ENCOUNTER — Encounter (HOSPITAL_COMMUNITY): Payer: Self-pay | Admitting: Family Medicine

## 2023-03-27 ENCOUNTER — Other Ambulatory Visit: Payer: Self-pay

## 2023-03-27 ENCOUNTER — Inpatient Hospital Stay (HOSPITAL_COMMUNITY)
Admission: EM | Admit: 2023-03-27 | Discharge: 2023-04-04 | DRG: 314 | Disposition: A | Discharge status: Home / Self Care | Payer: Medicare Other | Attending: Family Medicine | Admitting: Family Medicine

## 2023-03-27 DIAGNOSIS — M549 Dorsalgia, unspecified: Secondary | ICD-10-CM | POA: Diagnosis present

## 2023-03-27 DIAGNOSIS — Y832 Surgical operation with anastomosis, bypass or graft as the cause of abnormal reaction of the patient, or of later complication, without mention of misadventure at the time of the procedure: Secondary | ICD-10-CM | POA: Diagnosis present

## 2023-03-27 DIAGNOSIS — T827XXA Infection and inflammatory reaction due to other cardiac and vascular devices, implants and grafts, initial encounter: Principal | ICD-10-CM | POA: Diagnosis present

## 2023-03-27 DIAGNOSIS — Z1152 Encounter for screening for COVID-19: Secondary | ICD-10-CM | POA: Diagnosis not present

## 2023-03-27 DIAGNOSIS — R7881 Bacteremia: Secondary | ICD-10-CM | POA: Diagnosis not present

## 2023-03-27 DIAGNOSIS — I169 Hypertensive crisis, unspecified: Secondary | ICD-10-CM | POA: Diagnosis present

## 2023-03-27 DIAGNOSIS — K219 Gastro-esophageal reflux disease without esophagitis: Secondary | ICD-10-CM | POA: Diagnosis present

## 2023-03-27 DIAGNOSIS — Z91148 Patient's other noncompliance with medication regimen for other reason: Secondary | ICD-10-CM

## 2023-03-27 DIAGNOSIS — I38 Endocarditis, valve unspecified: Secondary | ICD-10-CM | POA: Diagnosis not present

## 2023-03-27 DIAGNOSIS — Z886 Allergy status to analgesic agent status: Secondary | ICD-10-CM

## 2023-03-27 DIAGNOSIS — Z452 Encounter for adjustment and management of vascular access device: Secondary | ICD-10-CM | POA: Diagnosis not present

## 2023-03-27 DIAGNOSIS — H903 Sensorineural hearing loss, bilateral: Secondary | ICD-10-CM | POA: Diagnosis present

## 2023-03-27 DIAGNOSIS — Z992 Dependence on renal dialysis: Secondary | ICD-10-CM | POA: Diagnosis not present

## 2023-03-27 DIAGNOSIS — W44G1XA Audio device entering into or through a natural orifice, initial encounter: Secondary | ICD-10-CM | POA: Diagnosis present

## 2023-03-27 DIAGNOSIS — D631 Anemia in chronic kidney disease: Secondary | ICD-10-CM | POA: Diagnosis present

## 2023-03-27 DIAGNOSIS — Q8781 Alport syndrome: Secondary | ICD-10-CM | POA: Diagnosis not present

## 2023-03-27 DIAGNOSIS — A419 Sepsis, unspecified organism: Principal | ICD-10-CM | POA: Diagnosis present

## 2023-03-27 DIAGNOSIS — N2581 Secondary hyperparathyroidism of renal origin: Secondary | ICD-10-CM | POA: Diagnosis present

## 2023-03-27 DIAGNOSIS — R079 Chest pain, unspecified: Secondary | ICD-10-CM

## 2023-03-27 DIAGNOSIS — I2489 Other forms of acute ischemic heart disease: Secondary | ICD-10-CM | POA: Diagnosis present

## 2023-03-27 DIAGNOSIS — I3139 Other pericardial effusion (noninflammatory): Secondary | ICD-10-CM | POA: Diagnosis present

## 2023-03-27 DIAGNOSIS — F17210 Nicotine dependence, cigarettes, uncomplicated: Secondary | ICD-10-CM | POA: Diagnosis present

## 2023-03-27 DIAGNOSIS — N186 End stage renal disease: Secondary | ICD-10-CM | POA: Diagnosis present

## 2023-03-27 DIAGNOSIS — Z888 Allergy status to other drugs, medicaments and biological substances status: Secondary | ICD-10-CM

## 2023-03-27 DIAGNOSIS — E861 Hypovolemia: Secondary | ICD-10-CM | POA: Diagnosis present

## 2023-03-27 DIAGNOSIS — I10 Essential (primary) hypertension: Secondary | ICD-10-CM | POA: Diagnosis not present

## 2023-03-27 DIAGNOSIS — I5022 Chronic systolic (congestive) heart failure: Secondary | ICD-10-CM | POA: Diagnosis present

## 2023-03-27 DIAGNOSIS — Z823 Family history of stroke: Secondary | ICD-10-CM

## 2023-03-27 DIAGNOSIS — Z79899 Other long term (current) drug therapy: Secondary | ICD-10-CM

## 2023-03-27 DIAGNOSIS — I132 Hypertensive heart and chronic kidney disease with heart failure and with stage 5 chronic kidney disease, or end stage renal disease: Secondary | ICD-10-CM | POA: Diagnosis present

## 2023-03-27 DIAGNOSIS — G43909 Migraine, unspecified, not intractable, without status migrainosus: Secondary | ICD-10-CM | POA: Diagnosis present

## 2023-03-27 DIAGNOSIS — T161XXA Foreign body in right ear, initial encounter: Secondary | ICD-10-CM | POA: Diagnosis present

## 2023-03-27 DIAGNOSIS — G8929 Other chronic pain: Secondary | ICD-10-CM | POA: Diagnosis present

## 2023-03-27 DIAGNOSIS — F319 Bipolar disorder, unspecified: Secondary | ICD-10-CM | POA: Diagnosis present

## 2023-03-27 DIAGNOSIS — M898X9 Other specified disorders of bone, unspecified site: Secondary | ICD-10-CM | POA: Diagnosis present

## 2023-03-27 DIAGNOSIS — I340 Nonrheumatic mitral (valve) insufficiency: Secondary | ICD-10-CM | POA: Diagnosis not present

## 2023-03-27 DIAGNOSIS — K921 Melena: Secondary | ICD-10-CM | POA: Insufficient documentation

## 2023-03-27 DIAGNOSIS — Z841 Family history of disorders of kidney and ureter: Secondary | ICD-10-CM

## 2023-03-27 DIAGNOSIS — Z822 Family history of deafness and hearing loss: Secondary | ICD-10-CM

## 2023-03-27 DIAGNOSIS — Z833 Family history of diabetes mellitus: Secondary | ICD-10-CM

## 2023-03-27 DIAGNOSIS — R197 Diarrhea, unspecified: Secondary | ICD-10-CM | POA: Diagnosis present

## 2023-03-27 DIAGNOSIS — Z91158 Patient's noncompliance with renal dialysis for other reason: Secondary | ICD-10-CM

## 2023-03-27 DIAGNOSIS — R011 Cardiac murmur, unspecified: Secondary | ICD-10-CM | POA: Diagnosis not present

## 2023-03-27 DIAGNOSIS — Z8249 Family history of ischemic heart disease and other diseases of the circulatory system: Secondary | ICD-10-CM

## 2023-03-27 DIAGNOSIS — M25511 Pain in right shoulder: Secondary | ICD-10-CM | POA: Insufficient documentation

## 2023-03-27 DIAGNOSIS — Z825 Family history of asthma and other chronic lower respiratory diseases: Secondary | ICD-10-CM

## 2023-03-27 DIAGNOSIS — T82898A Other specified complication of vascular prosthetic devices, implants and grafts, initial encounter: Secondary | ICD-10-CM | POA: Diagnosis not present

## 2023-03-27 HISTORY — DX: End stage renal disease: N18.6

## 2023-03-27 HISTORY — DX: Anemia, unspecified: D64.9

## 2023-03-27 HISTORY — DX: Cardiac murmur, unspecified: R01.1

## 2023-03-27 HISTORY — DX: Tobacco use: Z72.0

## 2023-03-27 HISTORY — DX: Cannabis abuse, uncomplicated: F12.10

## 2023-03-27 HISTORY — DX: Headache, unspecified: R51.9

## 2023-03-27 HISTORY — DX: Heart failure, unspecified: I50.9

## 2023-03-27 HISTORY — DX: Alport syndrome: Q87.81

## 2023-03-27 HISTORY — DX: Patient's noncompliance with other medical treatment and regimen due to unspecified reason: Z91.199

## 2023-03-27 HISTORY — DX: Dependence on renal dialysis: Z99.2

## 2023-03-27 LAB — ECHOCARDIOGRAM COMPLETE
Est EF: 55
S' Lateral: 4 cm
Single Plane A4C EF: 54.6 %

## 2023-03-27 LAB — COMPREHENSIVE METABOLIC PANEL
ALT: 16 U/L (ref 0–44)
AST: 25 U/L (ref 15–41)
Albumin: 3.2 g/dL — ABNORMAL LOW (ref 3.5–5.0)
Alkaline Phosphatase: 69 U/L (ref 38–126)
Anion gap: 16 — ABNORMAL HIGH (ref 5–15)
BUN: 58 mg/dL — ABNORMAL HIGH (ref 6–20)
CO2: 24 mmol/L (ref 22–32)
Calcium: 9.1 mg/dL (ref 8.9–10.3)
Chloride: 97 mmol/L — ABNORMAL LOW (ref 98–111)
Creatinine, Ser: 13.35 mg/dL — ABNORMAL HIGH (ref 0.61–1.24)
GFR, Estimated: 4 mL/min — ABNORMAL LOW (ref >60)
Glucose, Bld: 84 mg/dL (ref 70–99)
Potassium: 4.7 mmol/L (ref 3.5–5.1)
Sodium: 137 mmol/L (ref 135–145)
Total Bilirubin: 0.7 mg/dL (ref <1.2)
Total Protein: 6.6 g/dL (ref 6.5–8.1)

## 2023-03-27 LAB — I-STAT CHEM 8, ED
BUN: 53 mg/dL — ABNORMAL HIGH (ref 6–20)
Calcium, Ion: 1.05 mmol/L — ABNORMAL LOW (ref 1.15–1.40)
Chloride: 97 mmol/L — ABNORMAL LOW (ref 98–111)
Creatinine, Ser: 14.1 mg/dL — ABNORMAL HIGH (ref 0.61–1.24)
Glucose, Bld: 81 mg/dL (ref 70–99)
HCT: 32 % — ABNORMAL LOW (ref 39.0–52.0)
Hemoglobin: 10.9 g/dL — ABNORMAL LOW (ref 13.0–17.0)
Potassium: 4.5 mmol/L (ref 3.5–5.1)
Sodium: 137 mmol/L (ref 135–145)
TCO2: 27 mmol/L (ref 22–32)

## 2023-03-27 LAB — CBG MONITORING, ED: Glucose-Capillary: 86 mg/dL (ref 70–99)

## 2023-03-27 LAB — RESP PANEL BY RT-PCR (RSV, FLU A&B, COVID)  RVPGX2
Influenza A by PCR: NEGATIVE
Influenza B by PCR: NEGATIVE
Resp Syncytial Virus by PCR: NEGATIVE
SARS Coronavirus 2 by RT PCR: NEGATIVE

## 2023-03-27 LAB — CBC WITH DIFFERENTIAL/PLATELET
Abs Immature Granulocytes: 0.03 10*3/uL (ref 0.00–0.07)
Basophils Absolute: 0 10*3/uL (ref 0.0–0.1)
Basophils Relative: 0 %
Eosinophils Absolute: 0 10*3/uL (ref 0.0–0.5)
Eosinophils Relative: 0 %
HCT: 30.6 % — ABNORMAL LOW (ref 39.0–52.0)
Hemoglobin: 10 g/dL — ABNORMAL LOW (ref 13.0–17.0)
Immature Granulocytes: 1 %
Lymphocytes Relative: 7 %
Lymphs Abs: 0.4 10*3/uL — ABNORMAL LOW (ref 0.7–4.0)
MCH: 29.7 pg (ref 26.0–34.0)
MCHC: 32.7 g/dL (ref 30.0–36.0)
MCV: 90.8 fL (ref 80.0–100.0)
Monocytes Absolute: 0.6 10*3/uL (ref 0.1–1.0)
Monocytes Relative: 11 %
Neutro Abs: 3.9 10*3/uL (ref 1.7–7.7)
Neutrophils Relative %: 81 %
Platelets: 133 10*3/uL — ABNORMAL LOW (ref 150–400)
RBC: 3.37 MIL/uL — ABNORMAL LOW (ref 4.22–5.81)
RDW: 17.6 % — ABNORMAL HIGH (ref 11.5–15.5)
WBC: 4.9 10*3/uL (ref 4.0–10.5)
nRBC: 0 % (ref 0.0–0.2)

## 2023-03-27 LAB — APTT: aPTT: 36 s (ref 24–36)

## 2023-03-27 LAB — PROTIME-INR
INR: 1.2 (ref 0.8–1.2)
Prothrombin Time: 14.9 s (ref 11.4–15.2)

## 2023-03-27 LAB — I-STAT CG4 LACTIC ACID, ED: Lactic Acid, Venous: 0.8 mmol/L (ref 0.5–1.9)

## 2023-03-27 LAB — HEPATITIS B SURFACE ANTIGEN: Hepatitis B Surface Ag: NONREACTIVE

## 2023-03-27 LAB — PROCALCITONIN: Procalcitonin: 7.19 ng/mL

## 2023-03-27 LAB — BRAIN NATRIURETIC PEPTIDE: B Natriuretic Peptide: 721.3 pg/mL — ABNORMAL HIGH (ref 0.0–100.0)

## 2023-03-27 LAB — TROPONIN I (HIGH SENSITIVITY)
Troponin I (High Sensitivity): 110 ng/L (ref <18)
Troponin I (High Sensitivity): 143 ng/L (ref <18)

## 2023-03-27 MED ORDER — VANCOMYCIN HCL 1500 MG/300ML IV SOLN
1500.0000 mg | Freq: Once | INTRAVENOUS | Status: AC
  Administered 2023-03-27: 1500 mg via INTRAVENOUS
  Filled 2023-03-27: qty 300

## 2023-03-27 MED ORDER — LOSARTAN POTASSIUM 50 MG PO TABS
100.0000 mg | ORAL_TABLET | Freq: Every day | ORAL | Status: DC
  Administered 2023-03-28 – 2023-04-04 (×8): 100 mg via ORAL
  Filled 2023-03-27 (×9): qty 2

## 2023-03-27 MED ORDER — CHLORHEXIDINE GLUCONATE CLOTH 2 % EX PADS
6.0000 | MEDICATED_PAD | Freq: Every day | CUTANEOUS | Status: DC
  Administered 2023-03-28: 6 via TOPICAL

## 2023-03-27 MED ORDER — VANCOMYCIN HCL IN DEXTROSE 1-5 GM/200ML-% IV SOLN: 1000.0000 mg | Freq: Once | INTRAVENOUS | Status: DC

## 2023-03-27 MED ORDER — ACETAMINOPHEN 325 MG PO TABS
650.0000 mg | ORAL_TABLET | Freq: Four times a day (QID) | ORAL | Status: DC | PRN
  Administered 2023-03-27 – 2023-03-30 (×5): 650 mg via ORAL
  Filled 2023-03-27 (×5): qty 2

## 2023-03-27 MED ORDER — FENTANYL CITRATE PF 50 MCG/ML IJ SOSY
50.0000 ug | PREFILLED_SYRINGE | Freq: Once | INTRAMUSCULAR | Status: AC
  Administered 2023-03-27: 50 ug via INTRAVENOUS
  Filled 2023-03-27: qty 1

## 2023-03-27 MED ORDER — CARVEDILOL 25 MG PO TABS
25.0000 mg | ORAL_TABLET | Freq: Two times a day (BID) | ORAL | Status: DC
  Administered 2023-03-28 – 2023-04-03 (×11): 25 mg via ORAL
  Filled 2023-03-27 (×4): qty 1
  Filled 2023-03-27: qty 2
  Filled 2023-03-27 (×7): qty 1

## 2023-03-27 MED ORDER — DILTIAZEM HCL ER COATED BEADS 300 MG PO CP24
300.0000 mg | ORAL_CAPSULE | Freq: Every day | ORAL | Status: DC
  Administered 2023-03-28 – 2023-03-30 (×3): 300 mg via ORAL
  Filled 2023-03-27 (×3): qty 1

## 2023-03-27 MED ORDER — HYDRALAZINE HCL 50 MG PO TABS
100.0000 mg | ORAL_TABLET | Freq: Three times a day (TID) | ORAL | Status: DC
  Administered 2023-03-27 – 2023-04-04 (×22): 100 mg via ORAL
  Filled 2023-03-27 (×23): qty 2

## 2023-03-27 MED ORDER — AMLODIPINE BESYLATE 10 MG PO TABS
10.0000 mg | ORAL_TABLET | Freq: Every day | ORAL | Status: DC
  Administered 2023-03-28 – 2023-03-30 (×3): 10 mg via ORAL
  Filled 2023-03-27: qty 1
  Filled 2023-03-27: qty 2
  Filled 2023-03-27: qty 1

## 2023-03-27 MED ORDER — CEFEPIME HCL 2 G IV SOLR
2.0000 g | Freq: Once | INTRAVENOUS | Status: AC
  Administered 2023-03-27: 2 g via INTRAVENOUS
  Filled 2023-03-27: qty 12.5

## 2023-03-27 MED ORDER — ACETAMINOPHEN 650 MG RE SUPP: 650.0000 mg | Freq: Four times a day (QID) | RECTAL | Status: DC | PRN

## 2023-03-27 MED ORDER — METRONIDAZOLE 500 MG/100ML IV SOLN
500.0000 mg | Freq: Once | INTRAVENOUS | Status: AC
Start: 2023-03-27 — End: 2023-03-27
  Administered 2023-03-27: 500 mg via INTRAVENOUS
  Filled 2023-03-27: qty 100

## 2023-03-27 MED ORDER — ACETAMINOPHEN 325 MG PO TABS
650.0000 mg | ORAL_TABLET | Freq: Once | ORAL | Status: AC
  Administered 2023-03-27: 650 mg via ORAL
  Filled 2023-03-27: qty 2

## 2023-03-27 MED ORDER — HEPARIN SODIUM (PORCINE) 5000 UNIT/ML IJ SOLN
5000.0000 [IU] | Freq: Three times a day (TID) | INTRAMUSCULAR | Status: DC
  Administered 2023-03-27 – 2023-04-04 (×23): 5000 [IU] via SUBCUTANEOUS
  Filled 2023-03-27 (×25): qty 1

## 2023-03-27 NOTE — Progress Notes (Signed)
  Echocardiogram 2D Echocardiogram has been performed.  Delcie Roch 03/27/2023, 6:16 PM

## 2023-03-27 NOTE — ED Notes (Signed)
POA Guerry Minors [phone] would like an update asap

## 2023-03-27 NOTE — Assessment & Plan Note (Addendum)
Febrile (Tmax 103.2) but no leukocytosis. Unclear source of fever at this time.  Wound to his right arm does not appear infected.  Echo without any vegetation. CXR and CT head negative. Lactate 0.8. Flu/Covid/RSV negative. Blood cultures obtained prior to antibiotics and pending.  Of note, patient was admitted to the hospital last month for bacteremia with Enterobacter cloacae and treated with cefepime.  - Admitted to [facility], Dr. Lum Babe attending, progressive - Vascular surgery to see in the morning to evaluate right AV fistula - Continue broad-spectrum antibiotics started in ED: Cefepime, Flagyl, vancomycin - Tylenol 650mg  every 4-6 hours for fever, ice packs - Blood cultures, wound culture - HIV, hep B, Pro-Calcitonin -Consult wound care - Consider neurology consult if suspicion for meningitis increases

## 2023-03-27 NOTE — Progress Notes (Signed)
ED Pharmacy Antibiotic Sign Off An antibiotic consult was received from an ED provider for cefepime and vancomycin per pharmacy dosing for sepsis. A chart review was completed to assess appropriateness.  A single dose of cefepime and FLAGYL  placed by the ED provider.   The following one time order(s) were placed per pharmacy consult:  vancomycin 1500 mg x 1 dose  Further antibiotic and/or antibiotic pharmacy consults should be ordered by the admitting provider if indicated.   Thank you for allowing pharmacy to be a part of this patient's care.   Delmar Landau, PharmD, BCPS 03/27/2023 4:42 PM ED Clinical Pharmacist -  [phone]

## 2023-03-27 NOTE — ED Notes (Signed)
Unable to obtain IV access, MD Tegeler made aware.

## 2023-03-27 NOTE — Assessment & Plan Note (Signed)
Complains of chest tightness, unable to localize the pain or provide much history regarding the pain.  Troponin 110 but is a dialysis patient.  Will wait for second troponin.  Chest x-ray negative.  Respiratory viral panel negative.  However there were noted EKG changes including new T wave inversions, cardiology consulted and recommended echo.  Stat echo obtained that showed moderate pericardial effusion. - Appreciate cardiology recommendations - Continue to monitor symptoms

## 2023-03-27 NOTE — Assessment & Plan Note (Signed)
TTS HD patient, receiving dialysis through permacath.  Creatinine 13.35, GFR 4 on arrival.  Baseline between 11 and 13.  Family reports that patient has missed at least 2 sessions of dialysis, and reportedly had an incomplete run today due to an episode of unresponsiveness.  He is volume overloaded on our exam.  Hypertensive, highest 224/145 in the ED.  - Nephrology consulted, will plan to do HD in the morning - Avoid nephrotoxic drugs - AM RFP - Heparin for VTE prophylaxis

## 2023-03-27 NOTE — ED Notes (Signed)
Phlebotomy requested to bedside, unable to obtain blood work.

## 2023-03-27 NOTE — ED Notes (Signed)
Ice packs applied per MD Tegeler

## 2023-03-27 NOTE — H&P (Cosign Needed Addendum)
Hospital Admission History and Physical Service Pager: [phone]  Patient name: [name] Medical record number: [id number] Date of Birth: 1985/07/27 Age: 37 y.o. Gender: male  Primary Care Provider: [name], DO Consultants: vascular surgery, cardiology, nephrology Code Status: Full Code which was confirmed with family  Preferred Emergency Contact: Vinh Bloom [phone] POA [place]ors [phone]   Chief Complaint: sepsis  Assessment and Plan: [name] is a 37 y.o. male presenting with concerns for sepsis.  On arrival in the ED patient was febrile up to 103, hypotensive, tachycardic and tachypneic.  Earlier in the day was found to be unresponsive during HD prompting ED visit.  Unclear source of infection at this time however differential include inoculation of central cath, right upper extremity wound. Workup thus far unremarkable-no vegetations on echo, chest x-ray negative, CT head negative, viral panel negative.  Blood cultures pending.  Could be an infection of his permacath that he has been receiving dialysis through vs other viral illness vs meningitis (c/o neck pain but no nuchal rigidity)  Hypertension likely in the setting of missed dialysis this week and noncompliance with home hypertension medications. Assessment & Plan Sepsis, due to unspecified organism, unspecified whether acute organ dysfunction present (HCC) Febrile (Tmax 103.2) but no leukocytosis. Unclear source of fever at this time.  Wound to his right arm does not appear infected.  Echo without any vegetation. CXR and CT head negative. Lactate 0.8. Flu/Covid/RSV negative. Blood cultures obtained prior to antibiotics and pending.  Of note, patient was admitted to the hospital last month for bacteremia with Enterobacter cloacae and treated with cefepime.  - Admitted to [facility], Dr. Lum Babe attending, progressive - Vascular surgery to see in the morning to evaluate right AV fistula - Continue  broad-spectrum antibiotics started in ED: Cefepime, Flagyl, vancomycin - Tylenol 650mg  every 4-6 hours for fever, ice packs - Blood cultures, wound culture - HIV, hep B, Pro-Calcitonin -Consult wound care - Consider neurology consult if suspicion for meningitis increases Hypertension, unspecified type BP range on admission 180s-220s/110s-140s.  Suspect this is due to missed dialysis and noncompliance to home medication.  Complains of headache, this is chronic, CT head negative - Nephrology to do HD in the morning - Continue home medication-amlodipine 10 mg, carvedilol 25 mg twice daily, diltiazem 300 mg daily, hydralazine 100 mg 3 times daily, losartan 100 mg daily - Vitals per floor protocol ESRD (end stage renal disease) on dialysis (HCC) TTS HD patient, receiving dialysis through permacath.  Creatinine 13.35, GFR 4 on arrival.  Baseline between 11 and 13.  Family reports that patient has missed at least 2 sessions of dialysis, and reportedly had an incomplete run today due to an episode of unresponsiveness.  He is volume overloaded on our exam.  Hypertensive, highest 224/145 in the ED.  - Nephrology consulted, will plan to do HD in the morning - Avoid nephrotoxic drugs - AM RFP - Heparin for VTE prophylaxis Systolic murmur This appears to be new.  Stat echo obtained and murmur likely due to high flow state given multiple missed HD, there were no valvular issues or left-to-right shunting. - HD in the a.m. Chest pain, unspecified type Complains of chest tightness, unable to localize the pain or provide much history regarding the pain.  Troponin 110 but is a dialysis patient.  Will wait for second troponin.  Chest x-ray negative.  Respiratory viral panel negative.  However there were noted EKG changes including new T wave inversions, cardiology consulted and  recommended echo.  Stat echo obtained that showed moderate pericardial effusion. - Appreciate cardiology recommendations - Continue to  monitor symptoms Pericardial effusion Moderate circumferential pericardial effusion noted on echo - Cardiology consulted, appreciate recommendations - Repeat echo in 24 to 48 hours - Monitor for signs and symptoms of worsening pericardial effusion and consult cardiology as needed Blood in stool Reported blood in stool by family week.  Patient reports diarrhea over the last week but history is limited.  Hemoglobin 10.9 in the ED, this is actually higher than his baseline with his history of anemia. 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   FEN/GI: NPO VTE Prophylaxis: heparin  Disposition: progressive, inpatient status  History of Present Illness:  [name] is a 37 y.o. male presenting after becoming unresponsive in dialysis today.  He complains of cough, shortness of breath, and chest tightness.  Also complains of neck pain, back pain, headache.  Patient does have a history of chronic headaches.  He is unable to localize where his neck or back hurt.  Family at bedside states that patient has missed a week of dialysis and did not complete his run today due to the episode of unresponsiveness.  They state that he has also not been taking his medications as prescribed, he will drop them on the floor and then end up not taking them.  They report that he has been very tired over the last few days and not feeling well.  Family also reports blood in his stool over the last week, patient unable to provide much context regarding this but reports diarrhea  Pt was admitted 02/09/23-02/24/23 at [facility] - was found to have Enterobacter cloacae bacteremia in the setting of  pain, swelling, and bleeding over AVG site. Pt was put on Cefepime post dialysis through 10/8. Had TTE with no vegetation. Was to follow up with vascular surgery outpatient for care of wound vac. Required transfer to ICU for nicardipine gtt, thought to be from volume overload > was d/c on carvedilol, nifedipine, hydralazine, minoxidil, and losartan. Also had elevated troponins and EKGs indicative of demand ischemia in setting of HTN.  Discharged from [facility]/20/23, reported to have yellow malodorous drainage from AV fistula site.  Requiring dialysis through permcath, plan to do LUE vein mapping in future to plan access alternatives.   In the ED, patient was febrile initially at 101.7, this increased to 102.8 and he was given Tylenol and ice packs.  He was tachycardic and tachypneic as well as hypertensive, highest 224/145.  ED physician heard a new murmur and patient had EKG changes involving new T wave inversions and elevated troponin at 110 so an echo was obtained which showed moderate pericardial effusion, recommended repeat limited echocardiogram in 24 to 48 hours.  Dr. Diona Browner (cardiology) via secure chat suggested that his new murmurs likely due to high flow state, there were no valvular issues or left-to-right shunting's on the echo.  Recommended reconsulting cardiology if effusion becomes more significant. ED provider spoke with Dr. Randie Heinz with vascular surgery who plans to see patient in the morning, they were not concerned for abscess at fistula site at this time. ED provider  discussed with nephrology.  Dr. Elliot Gurney discussed with nephrology Dr. Glenna Fellows who recommends HD in the morning.  Review Of Systems: Per HPI with the following additions: none  Pertinent Past Medical History: Alport syndrome Secondary hyperparathyroidism HFmrEF Bipolar 1 Disorder ESRD on HD, TTS GERD HTN Anemia Bilateral sensorineural hearing loss  Remainder reviewed in history tab.   Pertinent  Past Surgical History: AV Fistulagram Appendectomy Peripheral vascular balloon angioplasty   Remainder reviewed in history tab.  Pertinent Social History: Tobacco use: former Alcohol use: no Other Substance use: marijuana Lives with sister  Pertinent Family History: Mom - HTN, kidney failure, diabetes Father - hearing loss Sister - HTN  Remainder reviewed in history tab.   Important Outpatient Medications: Amlodipine 10 mg PhosLo 667 mg Carvedilol 25 mg twice daily Diltiazem 300 mg daily Hydralazine 100 mg 3 times daily Losartan 100 mg daily Oxycodone 10 mg daily as needed Protonix 40 mg twice daily  Remainder reviewed in medication history.   Objective: BP (!) 194/115   Pulse (!) 108   Temp 99.5 F (37.5 C) (Oral)   Resp 15   SpO2 100%  Exam: General: ill appearing Eyes: PERRL, EOMI ENTM: Slightly dry mucous membranes Neck: Full ROM, no rigidity Cardiovascular: tachycardic, systolic murmur appreciated, bounding pulses in neck Respiratory: ctab, no increased work of breathing on RA, intermittent cough during exam Gastrointestinal: soft, non-distended, normal bowel sounds MSK: 2+ edema BLE. No overlying skin changes. Wound to R AC as below, no drainage or foul odor on my exam.  No midline tenderness to palpation to C, T, L-spine.  Derm: Warm, chills noted BUE Neuro: alert and oriented to person, place, situation but tired appearing, arouses to voice. Bilateral sensorineural hearing loss.   Labs:  CBC BMET  Recent Labs  Lab 03/27/23 1828 03/27/23 1838  WBC 4.9  --   HGB 10.0* 10.9*  HCT 30.6* 32.0*  PLT 133*  --    Recent Labs  Lab 03/27/23 1828 03/27/23 1838  NA 137 137  K 4.7 4.5  CL 97* 97*  CO2 24  --   BUN 58* 53*  CREATININE 13.35* 14.10*  GLUCOSE 84 81  CALCIUM 9.1  --     BNP 721.3 Trop 110 Lactate 0.8 PT/INR 14.9, 1.2 APTT 36 Blood cx pending Resp panel negative  EKG:  New T wave inversions in leads I, II, V4, V5, V6 that were  not present on prior EKG July 2024. No ST elevation. Tachycardic at 104 BPM. Qtc .    Imaging Studies Performed:  CXR 03/27/23 IMPRESSION: No acute cardiopulmonary abnormality. Perihilar vascular congestion.  Echo 03/27/23 1. Left ventricular ejection fraction, by estimation, is approximately 55% . The left ventricle has normal function. The left ventricle has no regional wall motion abnormalities. There is moderate concentric left ventricular hypertrophy. 2. Right ventricular systolic function is normal. The right ventricular size is normal. Mildly increased right ventricular wall thickness. Tricuspid regurgitation signal is inadequate for assessing PA pressure. 3. Turbulent and prominent flow noted in LVOT and RVOT with possible dilatation of PA. No obvious left ot right shunt observed. Most likely related to high flow state based on available images. 4. Moderate pericardial effusion, increased compared to study from January. The pericardial effusion is circumferential. Majority of collection is posterolateral to left ventricle and at right atrium. There is approximately 20% change in mitral inflow and 60% change in tricuspid inflow with respiration suggesting hemodynamic significance. Also early diastolic compression ( 1/ 3) of right atrium, but  no right ventricular compression. Although not diagnostic of tamponade, would follow clinically and consider reimaging within 24- 48 hours. 5. The mitral valve is grossly normal. Trivial mitral valve regurgitation. 6. The aortic valve is tricuspid. Aortic valve regurgitation is not visualized. 7. The inferior vena cava is normal in size with greater than 50% respiratory variability, suggesting right atrial pressure of 3 mmHg.   Jerre Simon, MD 03/27/2023, 11:57 PM PGY-3, [facility]  FPTS Intern pager: [phone], text pages welcome Secure chat group [facility] Teaching Service

## 2023-03-27 NOTE — ED Notes (Signed)
Dialysis consent signed at bedside

## 2023-03-27 NOTE — ED Notes (Signed)
2nd lac not needed in normal range

## 2023-03-27 NOTE — ED Notes (Signed)
MD Tegeler made aware of increasing temperature.

## 2023-03-27 NOTE — Sepsis Progress Note (Signed)
eLink is following this Code Sepsis. °

## 2023-03-27 NOTE — ED Provider Notes (Signed)
[place] EMERGENCY DEPARTMENT AT [facility] Provider Note   CSN: [id number] Arrival date & time: 03/27/23  1549     History {Add pertinent medical, surgical, social history, OB history to HPI:1} Chief Complaint  Patient presents with   Loss of Consciousness    [name] is a 37 y.o. male.  The history is provided by the patient and medical records. No language interpreter was used.  Loss of Consciousness Episode history:  Single Most recent episode:  Today Timing:  Unable to specify Progression:  Unable to specify Chronicity:  New Relieved by:  Nothing Worsened by:  Nothing Ineffective treatments:  None tried Associated symptoms: chest pain, fever, malaise/fatigue, nausea and shortness of breath   Associated symptoms: no confusion, no focal weakness, no headaches, no palpitations, no recent fall, no recent injury, no vomiting and no weakness        Home Medications Prior to Admission medications   Medication Sig Start Date End Date Taking? Authorizing Provider  amLODipine (NORVASC) 10 MG tablet TAKE 1 TABLET BY MOUTH EVERY DAY 04/10/22   Sabino Dick, DO  calcium acetate (PHOSLO) 667 MG capsule Take 1 capsule (667 mg total) by mouth 3 (three) times daily with meals. 01/04/22   Vonna Drafts, MD  carvedilol (COREG) 25 MG tablet 25 mg 2 (two) times daily with a meal. 01/09/23   [provider]  diltiazem (TIAZAC) 300 MG 24 hr capsule Take 1 capsule by mouth daily. 01/10/23 04/10/23  [provider]  hydrALAZINE (APRESOLINE) 100 MG tablet Take 1 tablet (100 mg total) by mouth 3 (three) times daily. 01/28/23   [name], DO  losartan (COZAAR) 100 MG tablet Take 1 tablet (100 mg total) by mouth daily. 11/15/22   Baglia, Corrina, PA-C  Oxycodone HCl 10 MG TABS Take 1 tablet by mouth as needed. 01/14/23   [provider]  pantoprazole (PROTONIX) 40 MG tablet Take 1 tablet (40 mg total) by mouth 2 (two) times daily. 01/28/23   [name], DO      Allergies    Zestril [lisinopril], Nsaids, and Risperdal [risperidone]    Review of Systems   Review of Systems  Constitutional:  Positive for chills, fatigue, fever and malaise/fatigue.  HENT:  Negative for congestion.   Eyes:  Negative for visual disturbance.  Respiratory:  Positive for cough, chest tightness and shortness of breath. Negative for wheezing.   Cardiovascular:  Positive for chest pain, leg swelling (chronic per pt) and syncope. Negative for palpitations.  Gastrointestinal:  Positive for diarrhea and nausea. Negative for abdominal pain, constipation and vomiting.  Genitourinary:  Negative for dysuria.  Musculoskeletal:  Negative for back pain, neck pain and neck stiffness.  Skin:  Positive for wound.  Neurological:  Negative for focal weakness, weakness, light-headedness, numbness and headaches.  Psychiatric/Behavioral:  Negative for agitation and confusion.   All other systems reviewed and are negative.   Physical Exam Updated Vital Signs BP (!) 186/116   Pulse (!) 108   Temp 99.5 F (37.5 C)   Resp 17   SpO2 95%  Physical Exam Constitutional:      General: He is not in acute distress.    Appearance: He is well-developed. He is not ill-appearing, toxic-appearing or diaphoretic.  HENT:     Head: Normocephalic and atraumatic.     Right Ear: External ear normal.     Left Ear: External ear normal.     Nose: Nose normal.     Mouth/Throat:  Pharynx: No oropharyngeal exudate.  Eyes:     Conjunctiva/sclera: Conjunctivae normal.     Pupils: Pupils are equal, round, and reactive to light.  Cardiovascular:     Rate and Rhythm: Tachycardia present.     Heart sounds: Murmur heard.  Pulmonary:     Effort: No respiratory distress.     Breath sounds: No stridor. Rhonchi present. No wheezing or rales.  Chest:     Chest wall: No tenderness.  Abdominal:     General: Abdomen is flat.     Palpations: Abdomen is soft.     Tenderness: There is no  abdominal tenderness. There is no guarding or rebound.  Musculoskeletal:        General: No swelling or tenderness.     Left elbow: No lacerations.     Cervical back: Normal range of motion and neck supple. No tenderness.     Right lower leg: Edema present.     Left lower leg: Edema present.     Comments: Patient has a horizontal wound on right distal upper arm near antecubital fossa.  Not focally tender and I do not see significant drainage although it was documented he had some foul-smelling purulence coming from it earlier.  Distally he has intact pulse, strength, and sensation in the hand.  Skin:    General: Skin is warm.     Findings: No erythema or rash.  Neurological:     Mental Status: [name] and oriented to person, place, and time.     Cranial Nerves: No cranial nerve deficit.     Motor: No abnormal muscle tone.     Coordination: Coordination normal.     Deep Tendon Reflexes: Reflexes normal.     ED Results / Procedures / Treatments   Labs (all labs ordered are listed, but only abnormal results are displayed) Labs Reviewed  RESP PANEL BY RT-PCR (RSV, FLU A&B, COVID)  RVPGX2  CULTURE, BLOOD (ROUTINE X 2)  CULTURE, BLOOD (ROUTINE X 2)  COMPREHENSIVE METABOLIC PANEL  CBC WITH DIFFERENTIAL/PLATELET  PROTIME-INR  APTT  BRAIN NATRIURETIC PEPTIDE  URINALYSIS, W/ REFLEX TO CULTURE (INFECTION SUSPECTED)  CBG MONITORING, ED  I-STAT CG4 LACTIC ACID, ED  I-STAT CHEM 8, ED  TROPONIN I (HIGH SENSITIVITY)    EKG EKG Interpretation Date/Time:  Friday March 27 2023 15:55:51 EST Ventricular Rate:  104 PR Interval:  124 QRS Duration:  76 QT Interval:  350 QTC Calculation: 460 R Axis:   25  Text Interpretation: Sinus tachycardia Left ventricular hypertrophy with repolarization abnormality ( R in aVL , Sokolow-Lyon , Romhilt-Estes ) Cannot rule out Septal infarct , age undetermined Abnormal ECG When compared with ECG of 03-Dec-2022 11:12, PREVIOUS ECG IS PRESENT when  compared to prior, diffuse t wave inversions. no STEMI Confirmed by Theda Belfast ([id number]) on 03/27/2023 4:18:13 PM  Radiology ECHOCARDIOGRAM COMPLETE  Result Date: 03/27/2023    ECHOCARDIOGRAM REPORT   Patient Name:   [name] [facility] Date of Exam: 03/27/2023 Medical Rec #:  [id number]       Height:       69.0 in Accession #:    [id number]      Weight:       174.2 lb Date of Birth:  1986/04/27       BSA:          1.948 m Patient Age:    37 years        BP:  183/126 mmHg Patient Gender: M               HR:           110 bpm. Exam Location:  Inpatient Procedure: 2D Echo, Color Doppler and Cardiac Doppler STAT ECHO Indications:    murmur.  History:        Patient has prior history of Echocardiogram examinations, most                 recent 06/03/2022. End stage renal disease, Signs/Symptoms:Chest                 Pain and Shortness of Breath; Risk Factors:Current Smoker.  Sonographer:    Delcie Roch RDCS Referring Phys: [id number] Reianna Batdorf J Shakoya Gilmore IMPRESSIONS  1. Left ventricular ejection fraction, by estimation, is approximately 55%. The left ventricle has normal function. The left ventricle has no regional wall motion abnormalities. There is moderate concentric left ventricular hypertrophy.  2. Right ventricular systolic function is normal. The right ventricular size is normal. Mildly increased right ventricular wall thickness. Tricuspid regurgitation signal is inadequate for assessing PA pressure.  3. Turbulent and prominent flow noted in LVOT and RVOT with possible dilatation of PA. No obvious left ot right shunt observed. Most likely related to high flow state based on available images.  4. Moderate pericardial effusion, increased compared to study from January. The pericardial effusion is circumferential. Majority of collection is posterolateral to left ventricle and at right atrium. There is approximately 20% change in mitral inflow and 60% change in tricuspid inflow with respiration  suggesting hemodynamic significance. Also early diastolic compression (1/3) of right atrium, but no right ventricular compression. Although not diagnostic of tamponade, would follow clinically and consider reimaging within 24-48 hours.  5. The mitral valve is grossly normal. Trivial mitral valve regurgitation.  6. The aortic valve is tricuspid. Aortic valve regurgitation is not visualized.  7. The inferior vena cava is normal in size with greater than 50% respiratory variability, suggesting right atrial pressure of 3 mmHg. FINDINGS  Left Ventricle: Left ventricular ejection fraction, by estimation, is 55%. The left ventricle has normal function. The left ventricle has no regional wall motion abnormalities. Global longitudinal strain performed but not reported based on interpreter judgement due to suboptimal tracking. The left ventricular internal cavity size was normal in size. There is moderate concentric left ventricular hypertrophy. Left ventricular diastolic parameters are indeterminate. Right Ventricle: The right ventricular size is normal. Mildly increased right ventricular wall thickness. Right ventricular systolic function is normal. Tricuspid regurgitation signal is inadequate for assessing PA pressure. Left Atrium: Left atrial size was normal in size. Right Atrium: Right atrial size was normal in size. Pericardium: A moderately sized pericardial effusion is present. The pericardial effusion is circumferential. Mitral Valve: The mitral valve is grossly normal. Trivial mitral valve regurgitation. Tricuspid Valve: The tricuspid valve is grossly normal. Tricuspid valve regurgitation is mild. Aortic Valve: The aortic valve is tricuspid. Aortic valve regurgitation is not visualized. Pulmonic Valve: The pulmonic valve was grossly normal. Pulmonic valve regurgitation is trivial. Aorta: The aortic root and ascending aorta are structurally normal, with no evidence of dilitation. Venous: The inferior vena cava is  normal in size with greater than 50% respiratory variability, suggesting right atrial pressure of 3 mmHg. IAS/Shunts: No atrial level shunt detected by color flow Doppler.  LEFT VENTRICLE PLAX 2D LVIDd:         5.20 cm      Diastology LVIDs:  4.00 cm      LV e' medial:  8.27 cm/s LV PW:         1.60 cm      LV e' lateral: 11.70 cm/s LV IVS:        1.10 cm LVOT diam:     2.00 cm LV SV:         76 LV SV Index:   39 LVOT Area:     3.14 cm  LV Volumes (MOD) LV vol d, MOD A4C: 167.0 ml LV vol s, MOD A4C: 75.8 ml LV SV MOD A4C:     167.0 ml RIGHT VENTRICLE             IVC RV Basal diam:  3.20 cm     IVC diam: 2.50 cm RV S prime:     15.60 cm/s TAPSE (M-mode): 2.0 cm LEFT ATRIUM           Index        RIGHT ATRIUM           Index LA Vol (A4C): 65.9 ml 33.82 ml/m  RA Area:     15.60 cm                                    RA Volume:   38.80 ml  19.91 ml/m  AORTIC VALVE LVOT Vmax:   162.00 cm/s LVOT Vmean:  105.000 cm/s LVOT VTI:    0.243 m  AORTA Ao Root diam: 3.10 cm Ao Asc diam:  3.40 cm  SHUNTS Systemic VTI:  0.24 m Systemic Diam: 2.00 cm Nona Dell MD Electronically signed by Nona Dell MD Signature Date/Time: 03/27/2023/7:28:07 PM    Final    DG Chest Port 1 View  Result Date: 03/27/2023 CLINICAL DATA:  Questionable sepsis - evaluate for abnormality EXAM: PORTABLE CHEST 1 VIEW COMPARISON:  January 05, 2023 FINDINGS: The cardiomediastinal silhouette is unchanged and enlarged in contour.RIGHT chest CVC with tip terminating over the RIGHT atrium. Perihilar vascular prominence, similar in comparison to priors. No pleural effusion. No pneumothorax. No acute pleuroparenchymal abnormality. IMPRESSION: No acute cardiopulmonary abnormality. Perihilar vascular congestion. Electronically Signed   By: Meda Klinefelter M.D.   On: 03/27/2023 17:07    Procedures Procedures  {Document cardiac monitor, telemetry assessment procedure when appropriate:1}  Medications Ordered in ED Medications   metroNIDAZOLE (FLAGYL) IVPB 500 mg (500 mg Intravenous New Bag/Given 03/27/23 1912)  vancomycin (VANCOREADY) IVPB 1500 mg/300 mL (has no administration in time range)  ceFEPIme (MAXIPIME) 2 g in sodium chloride 0.9 % 100 mL IVPB (0 g Intravenous Stopped 03/27/23 1910)  acetaminophen (TYLENOL) tablet 650 mg (650 mg Oral Given 03/27/23 1837)  fentaNYL (SUBLIMAZE) injection 50 mcg (50 mcg Intravenous Given 03/27/23 1837)    ED Course/ Medical Decision Making/ A&P   {   Click here for ABCD2, HEART and other calculatorsREFRESH Note before signing :1}                              Medical Decision Making Amount and/or Complexity of Data Reviewed Labs: ordered. Radiology: ordered. ECG/medicine tests: ordered.  Risk OTC drugs. Prescription drug management. Decision regarding hospitalization.    [name] is a 37 y.o. 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  Patient found a blood pressure over 260 systolic, but was found to have a temperature of 102 and tachypnea.  Patient reports that his arm is still hurting and there is documentation recently that he has been having a yellow and foul-smelling pus coming from the right forearm wound.  Patient reports he is having no headache or neck pain but is reporting chest tightness and pain.  He is reporting some shortness of breath, some cough, some diarrhea with possible blood in it, nausea but no vomiting, and the right arm pain.  On my evaluation in the hallway, patient is very warm to the touch, he is tachycardic, and he is ill-appearing.  He is shaking all over.  He has a loud systolic murmur which I cannot see has been documented in the past on recent notes.  His lungs have  some faint coarseness.  Chest is nontender.  Abdomen nontender.  He has intact radial pulses in arms and did not have focal tenderness in his right forearm where he has the wound.  I did not see purulence coming out of it as was reported.  He has some numbness that he reports is unchanged in his forearm but had intact grip strength bilaterally.  Clinically I am concerned about sepsis.  Patient quickly moved to an exam room after I saw him.  With his new murmur I am concerned about possible endocarditis as well.  Will get chest x-ray, labs, and urinalysis as he does make some urine he reports.  For missing dialysis and not completing today, will touch base with nephrology.  With this concern for purulent drainage from this right arm wound in the setting of sepsis, will consult vascular surgery although it appears most of his vascular care is with [facility].  Due to the EKG changes and ongoing chest pressure and now new murmur, will call cardiology.  Due to his mild shortness of breath, blood pressure very high, and dialysis use, will hold on flooding him with fluids initially.  Will intentionally hold on fluids and a code sepsis until I speak with nephrology if they feel it is appropriate to give fluids.  4:49 PM Just spoke to Dr. Herbie Baltimore with cardiology.  He recommended getting a stat complete echo due to the new murmur.  He agrees with medicine admission for the sepsis.  5:35 PM Spoke to nephrology who will see the patient for help arrange dialysis during the admission.  They confirmed they do not want a large amount fluids to be given to the patient at this time.   7:27 PM Spoke with Dr. Randie Heinz who agrees that given my exam this sounds less likely this is primarily a source from his arm.  He will see the patient in the morning unless symptoms are to worsen acutely and he needs to come evaluate him tonight.  I did not think that was the case.  Will call for medical admission.  7:34 PM Troponin  is elevated tonight at 110.  Previous was 25 several months ago.  BNP is elevated but similar what has been in the past at 721.  Will discuss this with medicine and see what cardiology recommends.  7:48 PM Spoke to medicine and they will come to admit.  8:26 PM Spoke to cardiology fellow and cardiology read echo that see he has an effusion but no frank tamponade at  this time.  They recommended repeat limited echo in 24 to 40 hours.  They are also now aware of the elevated troponin.  {Document critical care time when appropriate:1} {Document review of labs and clinical decision tools ie heart score, Chads2Vasc2 etc:1}  {Document your independent review of radiology images, and any outside records:1} {Document your discussion with family members, caretakers, and with consultants:1} {Document social determinants of health affecting pt's care:1} {Document your decision making why or why not admission, treatments were needed:1} Final Clinical Impression(s) / ED Diagnoses Final diagnoses:  Sepsis, due to unspecified organism, unspecified whether acute organ dysfunction present [facility])    Clinical Impression: 1. Sepsis, due to unspecified organism, unspecified whether acute organ dysfunction present [facility])     Disposition: Admit  This note was prepared with assistance of Dragon voice recognition software. Occasional wrong-word or sound-a-like substitutions may have occurred due to the inherent limitations of voice recognition software.

## 2023-03-27 NOTE — ED Notes (Signed)
MD Elliot Gurney made aware of temperature.

## 2023-03-27 NOTE — ED Triage Notes (Signed)
Initially unresponsive in dialysis center. Pt only had 5kg fluid removed and passed out. Did not finish dialysis. Initial bp was 268/160. HR in the 80s. Pt is now awake and alert. 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 Fever of 102.

## 2023-03-27 NOTE — ED Notes (Signed)
MD Tegeler made aware of Troponin 110

## 2023-03-27 NOTE — ED Notes (Signed)
Unable to collect 2nd blood culture.

## 2023-03-28 ENCOUNTER — Encounter (HOSPITAL_COMMUNITY): Payer: Self-pay | Admitting: Family Medicine

## 2023-03-28 DIAGNOSIS — R079 Chest pain, unspecified: Secondary | ICD-10-CM | POA: Diagnosis not present

## 2023-03-28 DIAGNOSIS — N186 End stage renal disease: Secondary | ICD-10-CM | POA: Diagnosis not present

## 2023-03-28 DIAGNOSIS — T82898A Other specified complication of vascular prosthetic devices, implants and grafts, initial encounter: Secondary | ICD-10-CM | POA: Diagnosis not present

## 2023-03-28 DIAGNOSIS — A419 Sepsis, unspecified organism: Secondary | ICD-10-CM | POA: Diagnosis not present

## 2023-03-28 DIAGNOSIS — I3139 Other pericardial effusion (noninflammatory): Secondary | ICD-10-CM | POA: Diagnosis present

## 2023-03-28 DIAGNOSIS — I10 Essential (primary) hypertension: Secondary | ICD-10-CM | POA: Diagnosis not present

## 2023-03-28 DIAGNOSIS — Z992 Dependence on renal dialysis: Secondary | ICD-10-CM | POA: Diagnosis not present

## 2023-03-28 DIAGNOSIS — K921 Melena: Secondary | ICD-10-CM | POA: Insufficient documentation

## 2023-03-28 HISTORY — DX: Other pericardial effusion (noninflammatory): I31.39

## 2023-03-28 LAB — GLUCOSE, CAPILLARY: Glucose-Capillary: 108 mg/dL — ABNORMAL HIGH (ref 70–99)

## 2023-03-28 LAB — RENAL FUNCTION PANEL
Albumin: 2.9 g/dL — ABNORMAL LOW (ref 3.5–5.0)
Albumin: 3.4 g/dL — ABNORMAL LOW (ref 3.5–5.0)
Anion gap: 17 — ABNORMAL HIGH (ref 5–15)
Anion gap: 22 — ABNORMAL HIGH (ref 5–15)
BUN: 71 mg/dL — ABNORMAL HIGH (ref 6–20)
BUN: 78 mg/dL — ABNORMAL HIGH (ref 6–20)
CO2: 24 mmol/L (ref 22–32)
CO2: 21 mmol/L — ABNORMAL LOW (ref 22–32)
Calcium: 9.2 mg/dL (ref 8.9–10.3)
Calcium: 9.6 mg/dL (ref 8.9–10.3)
Chloride: 99 mmol/L (ref 98–111)
Chloride: 95 mmol/L — ABNORMAL LOW (ref 98–111)
Creatinine, Ser: 15.59 mg/dL — ABNORMAL HIGH (ref 0.61–1.24)
Creatinine, Ser: 16.87 mg/dL — ABNORMAL HIGH (ref 0.61–1.24)
GFR, Estimated: 4 mL/min — ABNORMAL LOW (ref >60)
GFR, Estimated: 3 mL/min — ABNORMAL LOW (ref >60)
Glucose, Bld: 97 mg/dL (ref 70–99)
Glucose, Bld: 86 mg/dL (ref 70–99)
Phosphorus: 10 mg/dL — ABNORMAL HIGH (ref 2.5–4.6)
Phosphorus: 10.7 mg/dL — ABNORMAL HIGH (ref 2.5–4.6)
Potassium: 4.7 mmol/L (ref 3.5–5.1)
Potassium: 5 mmol/L (ref 3.5–5.1)
Sodium: 140 mmol/L (ref 135–145)
Sodium: 138 mmol/L (ref 135–145)

## 2023-03-28 LAB — TROPONIN I (HIGH SENSITIVITY)
Troponin I (High Sensitivity): 125 ng/L (ref <18)
Troponin I (High Sensitivity): 125 ng/L (ref <18)

## 2023-03-28 LAB — MRSA NEXT GEN BY PCR, NASAL: MRSA by PCR Next Gen: NOT DETECTED

## 2023-03-28 LAB — BLOOD CULTURE ID PANEL (REFLEXED) - BCID2

## 2023-03-28 LAB — CBC
HCT: 30.3 % — ABNORMAL LOW (ref 39.0–52.0)
HCT: 33.8 % — ABNORMAL LOW (ref 39.0–52.0)
Hemoglobin: 9.5 g/dL — ABNORMAL LOW (ref 13.0–17.0)
Hemoglobin: 10.8 g/dL — ABNORMAL LOW (ref 13.0–17.0)
MCH: 28.9 pg (ref 26.0–34.0)
MCH: 28.8 pg (ref 26.0–34.0)
MCHC: 31.4 g/dL (ref 30.0–36.0)
MCHC: 32 g/dL (ref 30.0–36.0)
MCV: 92.1 fL (ref 80.0–100.0)
MCV: 90.1 fL (ref 80.0–100.0)
Platelets: 111 10*3/uL — ABNORMAL LOW (ref 150–400)
Platelets: 126 10*3/uL — ABNORMAL LOW (ref 150–400)
RBC: 3.29 MIL/uL — ABNORMAL LOW (ref 4.22–5.81)
RBC: 3.75 MIL/uL — ABNORMAL LOW (ref 4.22–5.81)
RDW: 17.5 % — ABNORMAL HIGH (ref 11.5–15.5)
RDW: 17.3 % — ABNORMAL HIGH (ref 11.5–15.5)
WBC: 6.2 10*3/uL (ref 4.0–10.5)
WBC: 4.8 10*3/uL (ref 4.0–10.5)
nRBC: 0 % (ref 0.0–0.2)
nRBC: 0 % (ref 0.0–0.2)

## 2023-03-28 LAB — HIV ANTIBODY (ROUTINE TESTING W REFLEX): HIV Screen 4th Generation wRfx: NONREACTIVE

## 2023-03-28 MED ORDER — ORAL CARE MOUTH RINSE: 15.0000 mL | OROMUCOSAL | Status: DC | PRN

## 2023-03-28 MED ORDER — SODIUM CHLORIDE 0.9 % IV SOLN
1.0000 g | INTRAVENOUS | Status: DC
  Administered 2023-03-28: 1 g via INTRAVENOUS
  Filled 2023-03-28 (×2): qty 10

## 2023-03-28 MED ORDER — ANTICOAGULANT SODIUM CITRATE 4% (200MG/5ML) IV SOLN
5.0000 mL | Status: DC | PRN
  Filled 2023-03-28: qty 5

## 2023-03-28 MED ORDER — DARBEPOETIN ALFA 150 MCG/0.3ML IJ SOSY
150.0000 ug | PREFILLED_SYRINGE | INTRAMUSCULAR | Status: DC
  Administered 2023-03-31: 150 ug via SUBCUTANEOUS
  Filled 2023-03-28: qty 0.3

## 2023-03-28 MED ORDER — HEPARIN SODIUM (PORCINE) 1000 UNIT/ML DIALYSIS
1000.0000 [IU] | INTRAMUSCULAR | Status: DC | PRN
  Filled 2023-03-28: qty 1

## 2023-03-28 MED ORDER — LIDOCAINE HCL (PF) 1 % IJ SOLN
5.0000 mL | INTRAMUSCULAR | Status: DC | PRN
Start: 2023-03-28 — End: 2023-03-29

## 2023-03-28 MED ORDER — PANTOPRAZOLE SODIUM 40 MG PO TBEC
40.0000 mg | DELAYED_RELEASE_TABLET | Freq: Every day | ORAL | Status: DC
  Administered 2023-03-28 – 2023-04-04 (×8): 40 mg via ORAL
  Filled 2023-03-28 (×8): qty 1

## 2023-03-28 MED ORDER — METRONIDAZOLE 500 MG/100ML IV SOLN
500.0000 mg | Freq: Two times a day (BID) | INTRAVENOUS | Status: DC
  Administered 2023-03-28 – 2023-03-29 (×3): 500 mg via INTRAVENOUS
  Filled 2023-03-28 (×3): qty 100

## 2023-03-28 MED ORDER — PENTAFLUOROPROP-TETRAFLUOROETH EX AERO: 1.0000 | INHALATION_SPRAY | CUTANEOUS | Status: DC | PRN

## 2023-03-28 MED ORDER — CHLORHEXIDINE GLUCONATE CLOTH 2 % EX PADS
6.0000 | MEDICATED_PAD | Freq: Every day | CUTANEOUS | Status: DC
  Administered 2023-03-29 – 2023-04-03 (×6): 6 via TOPICAL

## 2023-03-28 MED ORDER — ALTEPLASE 2 MG IJ SOLR: 2.0000 mg | Freq: Once | INTRAMUSCULAR | Status: DC | PRN

## 2023-03-28 MED ORDER — LIDOCAINE-PRILOCAINE 2.5-2.5 % EX CREA: 1.0000 | TOPICAL_CREAM | CUTANEOUS | Status: DC | PRN

## 2023-03-28 MED ORDER — VANCOMYCIN HCL 750 MG/150ML IV SOLN
750.0000 mg | INTRAVENOUS | Status: DC
  Administered 2023-03-28: 750 mg via INTRAVENOUS
  Filled 2023-03-28: qty 150

## 2023-03-28 NOTE — Assessment & Plan Note (Signed)
Hgb stable. -Monitor  -Consider inpatient versus outpatient work-up with GI.

## 2023-03-28 NOTE — Consult Note (Signed)
[facility] Renal Consultation Note  Indication for Consultation:  Management of ESRD/hemodialysis; anemia, hypertension/volume and secondary hyperparathyroidism  HPI: [name] is a 37 y.o. male with ESRD chronic HD( [facility] schedule history of noncompliance, missed past Saturday and Tuesday Txs) other med problems as below.  Admitted with fevers, possible sepsis, fever and tachycardia.  With right arm pain(history of AV Fsurgery 2/2 infection avf removed, followed recently by [facility] med center) and low back pain.  He is dialyzing using TDC.  Stated he was feeling bad before dialysis yesterday.  Also having headache/"no signs of meningeal irritation"  In ER hypertensive(likely due to excess volume and also medication nonadherence) noted hypotensive at outpatient dialysis most every treatment, echo with moderate pericardial effusion EF 55% chest x-ray negative for acute cardiopulmonary process, K4.7, BUN 71, CR 15.6, NA 140, WBC 6.2, Hgb 9.5, troponin 110, 143, BNP 721,  CT head negative for acute intracranial pathology  Seen in ER now, just finished breakfast states feels better, no shortness of breath or chest pain now.  Agrees for HD today on schedule      Past Medical History:  Diagnosis Date   Bipolar 1 disorder (HCC)    Depression    ESRD on hemodialysis (HCC)    [facility] at [facility] schedule   GERD (gastroesophageal reflux disease)    Hearing difficulty of left ear    75% hearing   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   Hypertension    Low blood sugar     Past Surgical History:  Procedure Laterality Date   A/V FISTULAGRAM Right 10/27/2022   Procedure: A/V Fistulagram;  Surgeon: Chuck Hint, MD;  Location: [facility] INVASIVE CV LAB;  Service: Cardiovascular;  Laterality: Right;   APPENDECTOMY     AV FISTULA PLACEMENT Left 03/03/2019   Procedure: ARTERIOVENOUS (AV) FISTULA CREATION LEFT ARM;  Surgeon: Maeola Harman, MD;  Location: [facility]  OR;  Service: Vascular;  Laterality: Left;   BASCILIC VEIN TRANSPOSITION Right 04/12/2019   Procedure: RIGHT UPPER EXTREMITY BASCILIC VEIN TRANSPOSITION FIRST STAGE FISTULA;  Surgeon: Chuck Hint, MD;  Location: [facility] OR;  Service: Vascular;  Laterality: Right;   BIOPSY  10/14/2021   Procedure: BIOPSY;  Surgeon: Sherrilyn Rist, MD;  Location: [facility] ENDOSCOPY;  Service: Gastroenterology;;   BIOPSY  06/06/2022   Procedure: BIOPSY;  Surgeon: Napoleon Form, MD;  Location: [facility] ENDOSCOPY;  Service: Gastroenterology;;   BIOPSY  11/12/2022   Procedure: BIOPSY;  Surgeon: Lemar Lofty., MD;  Location: [facility] ENDOSCOPY;  Service: Gastroenterology;;   BIOPSY  12/05/2022   Procedure: BIOPSY;  Surgeon: [name], MD;  Location: [facility] ENDOSCOPY;  Service: Gastroenterology;;   COLONOSCOPY WITH PROPOFOL N/A 10/14/2021   Procedure: COLONOSCOPY WITH PROPOFOL;  Surgeon: Sherrilyn Rist, MD;  Location: [facility] ENDOSCOPY;  Service: Gastroenterology;  Laterality: N/A;   ESOPHAGOGASTRODUODENOSCOPY N/A 11/12/2022   Procedure: ESOPHAGOGASTRODUODENOSCOPY (EGD);  Surgeon: Lemar Lofty., MD;  Location: [facility] ENDOSCOPY;  Service: Gastroenterology;  Laterality: N/A;   ESOPHAGOGASTRODUODENOSCOPY N/A 12/05/2022   Procedure: ESOPHAGOGASTRODUODENOSCOPY (EGD);  Surgeon: [name], MD;  Location: [facility] ENDOSCOPY;  Service: Gastroenterology;  Laterality: N/A;   ESOPHAGOGASTRODUODENOSCOPY (EGD) WITH PROPOFOL N/A 06/06/2022   Procedure: ESOPHAGOGASTRODUODENOSCOPY (EGD) WITH PROPOFOL;  Surgeon: Napoleon Form, MD;  Location: [facility] ENDOSCOPY;  Service: Gastroenterology;  Laterality: N/A;   FISTULA SUPERFICIALIZATION Right 11/07/2022   Procedure: PLICATION OF LARGE ANEURYSMS OF RIGHT ARTERIOVENOUS FISTULA WITH INSERTION  OF 7mm INTERPOSITIONAL GORTEX GRAFT;  Surgeon: Chuck Hint, MD;  Location: [facility] OR;  Service: Vascular;  Laterality: Right;   FLEXIBLE SIGMOIDOSCOPY N/A 04/14/2022   Procedure: FLEXIBLE  SIGMOIDOSCOPY;  Surgeon: Napoleon Form, MD;  Location: [facility] ENDOSCOPY;  Service: Gastroenterology;  Laterality: N/A;   INSERTION OF DIALYSIS CATHETER N/A 11/07/2022   Procedure: INSERTION OF RIGHT INTERNAL JUGULAR TUNNELED DIALYSIS CATHETER;  Surgeon: Chuck Hint, MD;  Location: [facility] OR;  Service: Vascular;  Laterality: N/A;   LIGATION OF ARTERIOVENOUS  FISTULA Left 03/06/2019   Procedure: LIGATION OF ARTERIOVENOUS  FISTULA;  Surgeon: Cephus Shelling, MD;  Location: [facility] OR;  Service: Vascular;  Laterality: Left;   PERIPHERAL VASCULAR BALLOON ANGIOPLASTY  10/27/2022   Procedure: PERIPHERAL VASCULAR BALLOON ANGIOPLASTY;  Surgeon: Chuck Hint, MD;  Location: [facility] INVASIVE CV LAB;  Service: Cardiovascular;;  rt upper arm fistula   SAVORY DILATION N/A 11/12/2022   Procedure: SAVORY DILATION;  Surgeon: Meridee Score Netty Starring., MD;  Location: [facility] ENDOSCOPY;  Service: Gastroenterology;  Laterality: N/A;   spinal tap     SPINE SURGERY     related to a spinal infection, unsure of surgery or infection source   WISDOM TOOTH EXTRACTION        Family History  Problem Relation Age of Onset   Hypertension Mother    Kidney failure Mother    Diabetes Mother    Hearing loss Father    Hypertension Sister    Heart disease Maternal Grandmother    Stroke Maternal Grandfather    Asthma Son       reports that he has been smoking cigarettes. He has never used smokeless tobacco. He reports that he does not currently use alcohol. He reports current drug use. Drug: Marijuana.   Allergies  Allergen Reactions   Zestril [Lisinopril] Swelling   Nsaids Other (See Comments)    "Kidney problems "   Risperdal [Risperidone] Other (See Comments)    Unknown reaction     Prior to Admission medications   Medication Sig Start Date End Date Taking? Authorizing Provider  amLODipine (NORVASC) 10 MG tablet TAKE 1 TABLET BY MOUTH EVERY DAY 04/10/22   Sabino Dick, DO  calcium acetate (PHOSLO)  667 MG capsule Take 1 capsule (667 mg total) by mouth 3 (three) times daily with meals. 01/04/22   Vonna Drafts, MD  carvedilol (COREG) 25 MG tablet 25 mg 2 (two) times daily with a meal. 01/09/23   [provider]  diltiazem (TIAZAC) 300 MG 24 hr capsule Take 1 capsule by mouth daily. 01/10/23 04/10/23  [provider]  hydrALAZINE (APRESOLINE) 100 MG tablet Take 1 tablet (100 mg total) by mouth 3 (three) times daily. 01/28/23   [name], DO  losartan (COZAAR) 100 MG tablet Take 1 tablet (100 mg total) by mouth daily. 11/15/22   Baglia, Corrina, PA-C  Oxycodone HCl 10 MG TABS Take 1 tablet by mouth as needed. 01/14/23   [provider]  pantoprazole (PROTONIX) 40 MG tablet Take 1 tablet (40 mg total) by mouth 2 (two) times daily. 01/28/23   [name], DO      Results for orders placed or performed during the hospital encounter of 03/27/23 (from the past 48 hour(s))  CBG monitoring, ED     Status: None   Collection Time: 03/27/23  4:20 PM  Result Value Ref Range   Glucose-Capillary 86 70 - 99 mg/dL    Comment: Glucose reference range applies only to samples taken after fasting  for at least 8 hours.  Comprehensive metabolic panel     Status: Abnormal   Collection Time: 03/27/23  6:28 PM  Result Value Ref Range   Sodium 137 135 - 145 mmol/L   Potassium 4.7 3.5 - 5.1 mmol/L   Chloride 97 (L) 98 - 111 mmol/L   CO2 24 22 - 32 mmol/L   Glucose, Bld 84 70 - 99 mg/dL    Comment: Glucose reference range applies only to samples taken after fasting for at least 8 hours.   BUN 58 (H) 6 - 20 mg/dL   Creatinine, Ser 04.54 (H) 0.61 - 1.24 mg/dL   Calcium 9.1 8.9 - 09.8 mg/dL   Total Protein 6.6 6.5 - 8.1 g/dL   Albumin 3.2 (L) 3.5 - 5.0 g/dL   AST 25 15 - 41 U/L   ALT 16 0 - 44 U/L   Alkaline Phosphatase 69 38 - 126 U/L   Total Bilirubin 0.7 <1.2 mg/dL   GFR, Estimated 4 (L) >60 mL/min    Comment: (NOTE) Calculated using the CKD-EPI Creatinine Equation (2021)    Anion  gap 16 (H) 5 - 15    Comment: Performed at [facility], [address], [place], [place] [address]  CBC with Differential     Status: Abnormal   Collection Time: 03/27/23  6:28 PM  Result Value Ref Range   WBC 4.9 4.0 - 10.5 K/uL   RBC 3.37 (L) 4.22 - 5.81 MIL/uL   Hemoglobin 10.0 (L) 13.0 - 17.0 g/dL   HCT 78.2 (L) 95.6 - 21.3 %   MCV 90.8 80.0 - 100.0 fL   MCH 29.7 26.0 - 34.0 pg   MCHC 32.7 30.0 - 36.0 g/dL   RDW 08.6 (H) 57.8 - 46.9 %   Platelets 133 (L) 150 - 400 K/uL   nRBC 0.0 0.0 - 0.2 %   Neutrophils Relative % 81 %   Neutro Abs 3.9 1.7 - 7.7 K/uL   Lymphocytes Relative 7 %   Lymphs Abs 0.4 (L) 0.7 - 4.0 K/uL   Monocytes Relative 11 %   Monocytes Absolute 0.6 0.1 - 1.0 K/uL   Eosinophils Relative 0 %   Eosinophils Absolute 0.0 0.0 - 0.5 K/uL   Basophils Relative 0 %   Basophils Absolute 0.0 0.0 - 0.1 K/uL   Immature Granulocytes 1 %   Abs Immature Granulocytes 0.03 0.00 - 0.07 K/uL    Comment: Performed at [facility], [address], [place], [place] [address]  Protime-INR     Status: None   Collection Time: 03/27/23  6:28 PM  Result Value Ref Range   Prothrombin Time 14.9 11.4 - 15.2 seconds   INR 1.2 0.8 - 1.2    Comment: (NOTE) INR goal varies based on device and disease states. Performed at [facility], [address], [place], [place] [address]   APTT     Status: None   Collection Time: 03/27/23  6:28 PM  Result Value Ref Range   aPTT 36 24 - 36 seconds    Comment: Performed at [facility], [address], [place], [place] [address]  Troponin I (High Sensitivity)     Status: Abnormal   Collection Time: 03/27/23  6:28 PM  Result Value Ref Range   Troponin I (High Sensitivity) 110 (HH) <18 ng/L    Comment: CRITICAL RESULT CALLED TO, READ BACK BY AND VERIFIED WITH A JAMES RN 03/27/2023 1932 BNUNNERY (NOTE) Elevated high  sensitivity troponin I (hsTnI) values and significant  changes across serial measurements may suggest ACS but  many other  chronic and acute conditions are known to elevate hsTnI results.  Refer to the "Links" section for chest pain algorithms and additional  guidance. Performed at [facility], [address] [place], [place] [address]   Resp panel by RT-PCR (RSV, Flu A&B, Covid) Anterior Nasal Swab     Status: None   Collection Time: 03/27/23  6:30 PM   Specimen: Anterior Nasal Swab  Result Value Ref Range   SARS Coronavirus 2 by RT PCR NEGATIVE NEGATIVE   Influenza A by PCR NEGATIVE NEGATIVE   Influenza B by PCR NEGATIVE NEGATIVE    Comment: (NOTE) The Xpert Xpress SARS-CoV-2/FLU/RSV plus assay is intended as an aid in the diagnosis of influenza from Nasopharyngeal swab specimens and should not be used as a sole basis for treatment. Nasal washings and aspirates are unacceptable for Xpert Xpress SARS-CoV-2/FLU/RSV testing.  Fact Sheet for Patients: BloggerCourse.com  Fact Sheet for Healthcare Providers: SeriousBroker.it  This test is not yet approved or cleared by the [place] FDA and has been authorized for detection and/or diagnosis of SARS-CoV-2 by FDA under an Emergency Use Authorization (EUA). This EUA will remain in effect (meaning this test can be used) for the duration of the COVID-19 declaration under Section 564(b)(1) of the Act, 21 U.S.C. section 360bbb-3(b)(1), unless the authorization is terminated or revoked.     Resp Syncytial Virus by PCR NEGATIVE NEGATIVE    Comment: (NOTE) Fact Sheet for Patients: BloggerCourse.com  Fact Sheet for Healthcare Providers: SeriousBroker.it  This test is not yet approved or cleared by the [place] FDA and has been authorized for detection and/or diagnosis of SARS-CoV-2 by FDA under an Emergency Use Authorization (EUA). This EUA will remain in effect (meaning this test can be used) for the duration of the COVID-19  declaration under Section 564(b)(1) of the Act, 21 U.S.C. section 360bbb-3(b)(1), unless the authorization is terminated or revoked.  Performed at [facility], [address] [place], [place] [address]   Blood Culture (routine x 2)     Status: None (Preliminary result)   Collection Time: 03/27/23  6:30 PM   Specimen: BLOOD  Result Value Ref Range   Specimen Description BLOOD SITE NOT SPECIFIED    Special Requests      BOTTLES DRAWN AEROBIC AND ANAEROBIC Blood Culture adequate volume   Culture      NO GROWTH < 24 HOURS Performed at [facility], [address], [place], [place] [address]    Report Status PENDING   Brain natriuretic peptide     Status: Abnormal   Collection Time: 03/27/23  6:30 PM  Result Value Ref Range   B Natriuretic Peptide 721.3 (H) 0.0 - 100.0 pg/mL    Comment: Performed at [facility], [address], [place], [place] [address]  I-Stat Lactic Acid, ED     Status: None   Collection Time: 03/27/23  6:38 PM  Result Value Ref Range   Lactic Acid, Venous 0.8 0.5 - 1.9 mmol/L  I-stat chem 8, ED (not at [facility] or [facility])     Status: Abnormal   Collection Time: 03/27/23  6:38 PM  Result Value Ref Range   Sodium 137 135 - 145 mmol/L   Potassium 4.5 3.5 - 5.1 mmol/L   Chloride 97 (L) 98 - 111 mmol/L   BUN 53 (H) 6 - 20  mg/dL   Creatinine, Ser 65.78 (H) 0.61 - 1.24 mg/dL   Glucose, Bld 81 70 - 99 mg/dL    Comment: Glucose reference range applies only to samples taken after fasting for at least 8 hours.   Calcium, Ion 1.05 (L) 1.15 - 1.40 mmol/L   TCO2 27 22 - 32 mmol/L   Hemoglobin 10.9 (L) 13.0 - 17.0 g/dL   HCT 46.9 (L) 62.9 - 52.8 %  Troponin I (High Sensitivity)     Status: Abnormal   Collection Time: 03/27/23 10:55 PM  Result Value Ref Range   Troponin I (High Sensitivity) 143 (HH) <18 ng/L    Comment: CRITICAL VALUE NOTED. VALUE IS CONSISTENT WITH PREVIOUSLY REPORTED/CALLED VALUE (NOTE) Elevated high sensitivity troponin I (hsTnI)  values and significant  changes across serial measurements may suggest ACS but many other  chronic and acute conditions are known to elevate hsTnI results.  Refer to the "Links" section for chest pain algorithms and additional  guidance. Performed at [facility], [address], [place], [place] [address]   Procalcitonin     Status: None   Collection Time: 03/27/23 10:55 PM  Result Value Ref Range   Procalcitonin 7.19 ng/mL    Comment:        Interpretation: PCT > 2 ng/mL: Systemic infection (sepsis) is likely, unless other causes are known. (NOTE)       Sepsis PCT Algorithm           Lower Respiratory Tract                                      Infection PCT Algorithm    ----------------------------     ----------------------------         PCT < 0.25 ng/mL                PCT < 0.10 ng/mL          Strongly encourage             Strongly discourage   discontinuation of antibiotics    initiation of antibiotics    ----------------------------     -----------------------------       PCT 0.25 - 0.50 ng/mL            PCT 0.10 - 0.25 ng/mL               OR       >80% decrease in PCT            Discourage initiation of                                            antibiotics      Encourage discontinuation           of antibiotics    ----------------------------     -----------------------------         PCT >= 0.50 ng/mL              PCT 0.26 - 0.50 ng/mL               AND       <80% decrease in PCT              Encourage initiation of  antibiotics       Encourage continuation           of antibiotics    ----------------------------     -----------------------------        PCT >= 0.50 ng/mL                  PCT > 0.50 ng/mL               AND         increase in PCT                  Strongly encourage                                      initiation of antibiotics    Strongly encourage escalation           of antibiotics                                      -----------------------------                                           PCT <= 0.25 ng/mL                                                 OR                                        > 80% decrease in PCT                                      Discontinue / Do not initiate                                             antibiotics  Performed at [facility], [address], [place], [place] [address]   Hepatitis B surface antigen     Status: None   Collection Time: 03/27/23 10:55 PM  Result Value Ref Range   Hepatitis B Surface Ag NON REACTIVE NON REACTIVE    Comment: Performed at [facility], [address] [place], [place] [address]  HIV Antibody (routine testing w rflx)     Status: None   Collection Time: 03/27/23 10:55 PM  Result Value Ref Range   HIV Screen 4th Generation wRfx Non Reactive Non Reactive    Comment: Performed at [facility], [address], [place], [place] [address]  Aerobic Culture w Gram Stain (superficial specimen)     Status: None (Preliminary result)   Collection Time: 03/27/23 11:05 PM   Specimen: Wound  Result Value Ref Range   Specimen Description WOUND    Special Requests LEFT ARM    Gram Stain  NO WBC SEEN NO ORGANISMS SEEN Performed at [facility], [address] [place], [place] [address]    Culture PENDING    Report Status PENDING   CBC     Status: Abnormal   Collection Time: 03/28/23  4:15 AM  Result Value Ref Range   WBC 6.2 4.0 - 10.5 K/uL   RBC 3.29 (L) 4.22 - 5.81 MIL/uL   Hemoglobin 9.5 (L) 13.0 - 17.0 g/dL   HCT 27.2 (L) 53.6 - 64.4 %   MCV 92.1 80.0 - 100.0 fL   MCH 28.9 26.0 - 34.0 pg   MCHC 31.4 30.0 - 36.0 g/dL   RDW 03.4 (H) 74.2 - 59.5 %   Platelets 111 (L) 150 - 400 K/uL   nRBC 0.0 0.0 - 0.2 %    Comment: Performed at [facility], [address] [place], [place] [address]  Renal function panel     Status: Abnormal   Collection Time: 03/28/23  4:15 AM  Result Value  Ref Range   Sodium 140 135 - 145 mmol/L   Potassium 4.7 3.5 - 5.1 mmol/L   Chloride 99 98 - 111 mmol/L   CO2 24 22 - 32 mmol/L   Glucose, Bld 97 70 - 99 mg/dL    Comment: Glucose reference range applies only to samples taken after fasting for at least 8 hours.   BUN 71 (H) 6 - 20 mg/dL   Creatinine, Ser 64.33 (H) 0.61 - 1.24 mg/dL   Calcium 9.2 8.9 - 29.5 mg/dL   Phosphorus 18.8 (H) 2.5 - 4.6 mg/dL   Albumin 2.9 (L) 3.5 - 5.0 g/dL   GFR, Estimated 4 (L) >60 mL/min    Comment: (NOTE) Calculated using the CKD-EPI Creatinine Equation (2021)    Anion gap 17 (H) 5 - 15    Comment: Performed at [facility], [address], [place], [place] [address]  Troponin I (High Sensitivity)     Status: Abnormal   Collection Time: 03/28/23  4:15 AM  Result Value Ref Range   Troponin I (High Sensitivity) 125 (HH) <18 ng/L    Comment: CRITICAL VALUE NOTED. VALUE IS CONSISTENT WITH PREVIOUSLY REPORTED/CALLED VALUE (NOTE) Elevated high sensitivity troponin I (hsTnI) values and significant  changes across serial measurements may suggest ACS but many other  chronic and acute conditions are known to elevate hsTnI results.  Refer to the "Links" section for chest pain algorithms and additional  guidance. Performed at [facility], [address] [place], [place] [address]   Troponin I (High Sensitivity)     Status: Abnormal   Collection Time: 03/28/23  6:14 AM  Result Value Ref Range   Troponin I (High Sensitivity) 125 (HH) <18 ng/L    Comment: CRITICAL VALUE NOTED. VALUE IS CONSISTENT WITH PREVIOUSLY REPORTED/CALLED VALUE (NOTE) Elevated high sensitivity troponin I (hsTnI) values and significant  changes across serial measurements may suggest ACS but many other  chronic and acute conditions are known to elevate hsTnI results.  Refer to the "Links" section for chest pain algorithms and additional  guidance. Performed at [facility], [address], [place], [place] [address]    .  ROS: See HPI   Physical Exam: Vitals:   03/28/23 1000 03/28/23 1030  BP: (!) 170/88 (!) 158/91  Pulse: 94 89  Resp: (!) 26 20  Temp:    SpO2: 98% 99%     General: Alert young male NAD hard of hearing HEENT:  [place], AT, nonicteric, HOH, MMM Neck: No JVD Heart: RRR soft 1/6 SEM, no rub appreciated, no gallop Lungs: CTA bilaterally nonlabored breathing 98% O2 sat Abdomen: NABS soft NT ND no ascites Extremities: Bilateral pedal edema 1+ Skin: Warm dry, right upper arm at prior AV fistula site swollen bandage clean dry not undressed Neuro: Alert O x 3, HOH, no acute focal deficits appreciated Dialysis Access: Right IJ TDC dressing dry clear  Dialysis Orders: Center: [facility] 4 hours, EDW 75, [place] 2K, 2 calcium, no heparin, calcitriol 1.25 mics p.o. q. HD, Mircera 150 mcg every 2 weeks last given 03/17/2023, was on Venofer 100 mg loading dose with transferrin sat 13% (hold iron this admit with fevers)  Assessment/Plan Fever/sepsis= in the ED given IV fluid bolus with sepsis protocol, blood cultures obtained started on cefepime, vancomycin and Flagyl IV.  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

## 2023-03-28 NOTE — ED Notes (Signed)
Pt reports pulling on hemodialysis catheter. MD Glenna Fellows at bedside, reports hemodialysis catheter intact, advised to reinforce dressing, dressing to be changed in dialysis later today.

## 2023-03-28 NOTE — Assessment & Plan Note (Addendum)
Suspect pericardial effusion is secondary to volume overload. ACS work-up for chest pain inconclusive with stable troponin's (ESRD), likely increased oxygen demand due to effusion and severely elevated blood pressures. Of note EKG with new T-wave inversions on admission, cardiology on board.  Previously noted systolic murmur not heard today. -Cardiology consulted, recs appreciated -Repeat ECHO 24-48hrs

## 2023-03-28 NOTE — Consult Note (Signed)
Cardiology Consultation   Patient ID: [name] MRN: [id number]; DOB: [birth year]  Admit date: 03/27/2023 Date of Consult: 03/28/2023  PCP:  [name], DO   [facility] Providers Cardiologist:  Lance Muss, MD        Patient Profile:   [name] is a 37 y.o. male with a hx of Alport syndrome, ESRD on dialysis who is being seen 03/28/2023 for the evaluation of pericardial effusion at the request of Dr. Velna Ochs.  History of Present Illness:   [name] was interviewed in the presence of his family members.  He denies any chest pain to me, but notes that he has some pinching at the site of his tunneled HD catheter on the right side.   Family notes that he missed about a week of dialysis prior to the HD session where he went unresponsive.  He is currently admitted and being evaluated for sepsis by the primary team.  We reviewed his echocardiogram together, see below.   Past Medical History:  Diagnosis Date   Bipolar 1 disorder (HCC)    Depression    ESRD on hemodialysis (HCC)    HD at [facility] on TTS schedule   GERD (gastroesophageal reflux disease)    Hearing difficulty of left ear    75% hearing   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   Hypertension    Low blood sugar     Past Surgical History:  Procedure Laterality Date   A/V FISTULAGRAM Right 10/27/2022   Procedure: A/V Fistulagram;  Surgeon: Chuck Hint, MD;  Location: [facility] INVASIVE CV LAB;  Service: Cardiovascular;  Laterality: Right;   APPENDECTOMY     AV FISTULA PLACEMENT Left 03/03/2019   Procedure: ARTERIOVENOUS (AV) FISTULA CREATION LEFT ARM;  Surgeon: Maeola Harman, MD;  Location: [facility] OR;  Service: Vascular;  Laterality: Left;   BASCILIC VEIN TRANSPOSITION Right 04/12/2019   Procedure: RIGHT UPPER EXTREMITY BASCILIC VEIN TRANSPOSITION FIRST STAGE FISTULA;  Surgeon: Chuck Hint, MD;  Location: [facility] OR;  Service: Vascular;  Laterality: Right;    BIOPSY  10/14/2021   Procedure: BIOPSY;  Surgeon: Sherrilyn Rist, MD;  Location: [facility] ENDOSCOPY;  Service: Gastroenterology;;   BIOPSY  06/06/2022   Procedure: BIOPSY;  Surgeon: Napoleon Form, MD;  Location: [facility] ENDOSCOPY;  Service: Gastroenterology;;   BIOPSY  11/12/2022   Procedure: BIOPSY;  Surgeon: Lemar Lofty., MD;  Location: [facility] ENDOSCOPY;  Service: Gastroenterology;;   BIOPSY  12/05/2022   Procedure: BIOPSY;  Surgeon: [name], MD;  Location: [facility] ENDOSCOPY;  Service: Gastroenterology;;   COLONOSCOPY WITH PROPOFOL N/A 10/14/2021   Procedure: COLONOSCOPY WITH PROPOFOL;  Surgeon: Sherrilyn Rist, MD;  Location: [facility] ENDOSCOPY;  Service: Gastroenterology;  Laterality: N/A;   ESOPHAGOGASTRODUODENOSCOPY N/A 11/12/2022   Procedure: ESOPHAGOGASTRODUODENOSCOPY (EGD);  Surgeon: Lemar Lofty., MD;  Location: [facility] ENDOSCOPY;  Service: Gastroenterology;  Laterality: N/A;   ESOPHAGOGASTRODUODENOSCOPY N/A 12/05/2022   Procedure: ESOPHAGOGASTRODUODENOSCOPY (EGD);  Surgeon: [name], MD;  Location: [facility] ENDOSCOPY;  Service: Gastroenterology;  Laterality: N/A;   ESOPHAGOGASTRODUODENOSCOPY (EGD) WITH PROPOFOL N/A 06/06/2022   Procedure: ESOPHAGOGASTRODUODENOSCOPY (EGD) WITH PROPOFOL;  Surgeon: Napoleon Form, MD;  Location: [facility] ENDOSCOPY;  Service: Gastroenterology;  Laterality: N/A;   FISTULA SUPERFICIALIZATION Right 11/07/2022   Procedure: PLICATION OF LARGE ANEURYSMS OF RIGHT ARTERIOVENOUS FISTULA WITH INSERTION OF 7mm INTERPOSITIONAL GORTEX GRAFT;  Surgeon: Chuck Hint, MD;  Location: [facility] OR;  Service: Vascular;  Laterality:  Right;   FLEXIBLE SIGMOIDOSCOPY N/A 04/14/2022   Procedure: FLEXIBLE SIGMOIDOSCOPY;  Surgeon: Napoleon Form, MD;  Location: [facility] ENDOSCOPY;  Service: Gastroenterology;  Laterality: N/A;   INSERTION OF DIALYSIS CATHETER N/A 11/07/2022   Procedure: INSERTION OF RIGHT INTERNAL JUGULAR TUNNELED DIALYSIS CATHETER;  Surgeon: Chuck Hint, MD;  Location: [facility] OR;  Service: Vascular;  Laterality: N/A;   LIGATION OF ARTERIOVENOUS  FISTULA Left 03/06/2019   Procedure: LIGATION OF ARTERIOVENOUS  FISTULA;  Surgeon: Cephus Shelling, MD;  Location: [facility] OR;  Service: Vascular;  Laterality: Left;   PERIPHERAL VASCULAR BALLOON ANGIOPLASTY  10/27/2022   Procedure: PERIPHERAL VASCULAR BALLOON ANGIOPLASTY;  Surgeon: Chuck Hint, MD;  Location: [facility] INVASIVE CV LAB;  Service: Cardiovascular;;  rt upper arm fistula   SAVORY DILATION N/A 11/12/2022   Procedure: SAVORY DILATION;  Surgeon: Meridee Score Netty Starring., MD;  Location: [facility] ENDOSCOPY;  Service: Gastroenterology;  Laterality: N/A;   spinal tap     SPINE SURGERY     related to a spinal infection, unsure of surgery or infection source   WISDOM TOOTH EXTRACTION       Home Medications:  Prior to Admission medications   Medication Sig Start Date End Date Taking? Authorizing Provider  amLODipine (NORVASC) 10 MG tablet TAKE 1 TABLET BY MOUTH EVERY DAY 04/10/22  Yes Sabino Dick, DO  calcium acetate (PHOSLO) 667 MG capsule Take 1 capsule (667 mg total) by mouth 3 (three) times daily with meals. 01/04/22  Yes Vonna Drafts, MD  carvedilol (COREG) 25 MG tablet 25 mg 2 (two) times daily with a meal. 01/09/23  Yes [provider]  diltiazem (TIAZAC) 300 MG 24 hr capsule Take 1 capsule by mouth daily. 01/10/23 04/10/23 Yes [provider]  divalproex (DEPAKOTE ER) 500 MG 24 hr tablet Take 500 mg by mouth daily.   Yes [provider]  hydrALAZINE (APRESOLINE) 100 MG tablet Take 1 tablet (100 mg total) by mouth 3 (three) times daily. 01/28/23  Yes [name], DO  losartan (COZAAR) 100 MG tablet Take 1 tablet (100 mg total) by mouth daily. 11/15/22  Yes Baglia, Corrina, PA-C  pantoprazole (PROTONIX) 40 MG tablet Take 1 tablet (40 mg total) by mouth 2 (two) times daily. 01/28/23  Yes [name], DO    Inpatient Medications: Scheduled Meds:   amLODipine  10 mg Oral Daily   carvedilol  25 mg Oral BID WC   Chlorhexidine Gluconate Cloth  6 each Topical Q0600   [START ON 03/31/2023] darbepoetin (ARANESP) injection - NON-DIALYSIS  150 mcg Subcutaneous Q Tue-1800   diltiazem  300 mg Oral Daily   heparin  5,000 Units Subcutaneous Q8H   hydrALAZINE  100 mg Oral Q8H   losartan  100 mg Oral Daily   Continuous Infusions:  anticoagulant sodium citrate     ceFEPime (MAXIPIME) IV     metronidazole Stopped (03/28/23 1123)   vancomycin Stopped (03/28/23 1157)   PRN Meds: acetaminophen **OR** acetaminophen, alteplase, anticoagulant sodium citrate, heparin, lidocaine (PF), lidocaine-prilocaine, pentafluoroprop-tetrafluoroeth  Allergies:    Allergies  Allergen Reactions   Zestril [Lisinopril] Swelling   Nsaids Other (See Comments)    "Kidney problems "   Risperdal [Risperidone] Other (See Comments)    Unknown reaction     Social History:   Social History   Socioeconomic History   Marital status: Married    Spouse name: Not on file   Number of children: 3   Years of education: Not on file   Highest education  level: Not on file  Occupational History   Occupation: HVAC  Tobacco Use   Smoking status: Every Day    Current packs/day: 0.00    Types: Cigarettes    Last attempt to quit: 02/27/2016    Years since quitting: 7.0   Smokeless tobacco: Never   Tobacco comments:    Returned to smoking 2 black and milds per day  Vaping Use   Vaping status: Never Used  Substance and Sexual Activity   Alcohol use: Not Currently    Comment: rare   Drug use: Yes    Types: Marijuana   Sexual activity: Not Currently  Other Topics Concern   Not on file  Social History Narrative   Not on file   Social Determinants of Health   Financial Resource Strain: Not on file  Food Insecurity: No Food Insecurity (01/14/2023)   Hunger Vital Sign    Worried About Running Out of Food in the Last Year: Never true    Ran Out of Food in the Last Year:  Never true  Transportation Needs: No Transportation Needs (01/14/2023)   PRAPARE - Administrator, Civil Service (Medical): No    Lack of Transportation (Non-Medical): No  Physical Activity: Inactive (03/24/2023)   Exercise Vital Sign    Days of Exercise per Week: 0 days    Minutes of Exercise per Session: 0 min  Stress: Stress Concern Present (03/24/2023)   [employer] of Occupational Health - Occupational Stress Questionnaire    Feeling of Stress : Rather much  Social Connections: Unknown (09/20/2021)   Received from [facility]   Social Network    Social Network: Not on file  Intimate Partner Violence: Patient Declined (12/03/2022)   Humiliation, Afraid, Rape, and Kick questionnaire    Fear of Current or Ex-Partner: Patient declined    Emotionally Abused: Patient declined    Physically Abused: Patient declined    Sexually Abused: Patient declined    Family History:    Family History  Problem Relation Age of Onset   Hypertension Mother    Kidney failure Mother    Diabetes Mother    Hearing loss Father    Hypertension Sister    Heart disease Maternal Grandmother    Stroke Maternal Grandfather    Asthma Son      ROS:  Please see the history of present illness.   Physical Exam/Data:   Vitals:   03/28/23 1030 03/28/23 1131 03/28/23 1340 03/28/23 1352  BP: (!) 158/91  (!) 192/125 (!) 180/113  Pulse: 89     Resp: 20  18   Temp:  98.1 F (36.7 C)    TempSrc:  Oral    SpO2: 99%  99%     Intake/Output Summary (Last 24 hours) at 03/28/2023 1406 Last data filed at 03/28/2023 1123 Gross per 24 hour  Intake 101.17 ml  Output --  Net 101.17 ml      02/03/2023    9:39 AM 01/30/2023    1:41 PM 01/28/2023   10:30 AM  Last 3 Weights  Weight (lbs) 174 lb 4 oz 173 lb 9.6 oz 170 lb 9.6 oz  Weight (kg) 79.039 kg 78.744 kg 77.384 kg     There is no height or weight on file to calculate BMI.  GEN: Well nourished, well developed in no acute  distress NECK: JVD not well visualized CARDIAC: regular rhythm, normal S1 and S2, no rubs or gallops. Soft murmur. VASCULAR: Radial pulses 2+ bilaterally.  RESPIRATORY:  Clear to auscultation without rales, wheezing or rhonchi  ABDOMEN: Soft, non-tender, non-distended MUSCULOSKELETAL:  Moves all 4 limbs independently SKIN: Warm and dry, bilateral LE edema NEUROLOGIC:  No focal neuro deficits noted. PSYCHIATRIC:  Normal affect    EKG:  The EKG was personally reviewed and demonstrates: Sinus tachycardia, LVH with repull abnormalities. Telemetry:  Telemetry was personally reviewed and demonstrates: Sinus rhythm/sinus tachycardia  Relevant CV Studies: Echo 11/15 shows EF 55% with moderate LVH, RV size and function is normal.  Moderate pericardial effusion that is circumferential.  No significant valve disease  Laboratory Data:  High Sensitivity Troponin:   Recent Labs  Lab 03/27/23 1828 03/27/23 2255 03/28/23 0415 03/28/23 0614  TROPONINIHS 110* 143* 125* 125*     Chemistry Recent Labs  Lab 03/27/23 1828 03/27/23 1838 03/28/23 0415  NA 137 137 140  K 4.7 4.5 4.7  CL 97* 97* 99  CO2 24  --  24  GLUCOSE 84 81 97  BUN 58* 53* 71*  CREATININE 13.35* 14.10* 15.59*  CALCIUM 9.1  --  9.2  GFRNONAA 4*  --  4*  ANIONGAP 16*  --  17*    Recent Labs  Lab 03/27/23 1828 03/28/23 0415  PROT 6.6  --   ALBUMIN 3.2* 2.9*  AST 25  --   ALT 16  --   ALKPHOS 69  --   BILITOT 0.7  --    Lipids No results for input(s): "CHOL", "TRIG", "HDL", "LABVLDL", "LDLCALC", "CHOLHDL" in the last 168 hours.  Hematology Recent Labs  Lab 03/27/23 1828 03/27/23 1838 03/28/23 0415  WBC 4.9  --  6.2  RBC 3.37*  --  3.29*  HGB 10.0* 10.9* 9.5*  HCT 30.6* 32.0* 30.3*  MCV 90.8  --  92.1  MCH 29.7  --  28.9  MCHC 32.7  --  31.4  RDW 17.6*  --  17.5*  PLT 133*  --  111*   Thyroid No results for input(s): "TSH", "FREET4" in the last 168 hours.  BNP Recent Labs  Lab 03/27/23 1830  BNP  721.3*    DDimer No results for input(s): "DDIMER" in the last 168 hours.   Radiology/Studies:  CT HEAD WO CONTRAST ( )  Result Date: 03/27/2023 CLINICAL DATA:  Headache, fever EXAM: CT HEAD WITHOUT CONTRAST TECHNIQUE: Contiguous axial images were obtained from the base of the skull through the vertex without intravenous contrast. RADIATION DOSE REDUCTION: This exam was performed according to the departmental dose-optimization program which includes automated exposure control, adjustment of the mA and/or kV according to patient size and/or use of iterative reconstruction technique. COMPARISON:  01/06/2023 FINDINGS: Brain: No acute intracranial abnormality. Specifically, no hemorrhage, hydrocephalus, mass lesion, acute infarction, or significant intracranial injury. Vascular: No hyperdense vessel or unexpected calcification. Skull: No acute calvarial abnormality. Sinuses/Orbits: No acute findings Other: None IMPRESSION: No acute intracranial abnormality. Electronically Signed   By: Charlett Nose M.D.   On: 03/27/2023 22:33   ECHOCARDIOGRAM COMPLETE  Result Date: 03/27/2023    ECHOCARDIOGRAM REPORT   Patient Name:   HARJIT PORTMAN [facility] Date of Exam: 03/27/2023 Medical Rec #:  [id number]       Height:       69.0 in Accession #:    [id number]      Weight:       174.2 lb Date of Birth:  [birth year]       BSA:          1.948 m Patient Age:  37 years        BP:           183/126 mmHg Patient Gender: M               HR:           110 bpm. Exam Location:  Inpatient Procedure: 2D Echo, Color Doppler and Cardiac Doppler STAT ECHO Indications:    murmur.  History:        Patient has prior history of Echocardiogram examinations, most                 recent 06/03/2022. End stage renal disease, Signs/Symptoms:Chest                 Pain and Shortness of Breath; Risk Factors:Current Smoker.  Sonographer:    Delcie Roch RDCS Referring Phys: [id number] Keliah Harned J TEGELER IMPRESSIONS  1. Left ventricular ejection  fraction, by estimation, is approximately 55%. The left ventricle has normal function. The left ventricle has no regional wall motion abnormalities. There is moderate concentric left ventricular hypertrophy.  2. Right ventricular systolic function is normal. The right ventricular size is normal. Mildly increased right ventricular wall thickness. Tricuspid regurgitation signal is inadequate for assessing PA pressure.  3. Turbulent and prominent flow noted in LVOT and RVOT with possible dilatation of PA. No obvious left ot right shunt observed. Most likely related to high flow state based on available images.  4. Moderate pericardial effusion, increased compared to study from January. The pericardial effusion is circumferential. Majority of collection is posterolateral to left ventricle and at right atrium. There is approximately 20% change in mitral inflow and 60% change in tricuspid inflow with respiration suggesting hemodynamic significance. Also early diastolic compression (1/3) of right atrium, but no right ventricular compression. Although not diagnostic of tamponade, would follow clinically and consider reimaging within 24-48 hours.  5. The mitral valve is grossly normal. Trivial mitral valve regurgitation.  6. The aortic valve is tricuspid. Aortic valve regurgitation is not visualized.  7. The inferior vena cava is normal in size with greater than 50% respiratory variability, suggesting right atrial pressure of 3 mmHg. FINDINGS  Left Ventricle: Left ventricular ejection fraction, by estimation, is 55%. The left ventricle has normal function. The left ventricle has no regional wall motion abnormalities. Global longitudinal strain performed but not reported based on interpreter judgement due to suboptimal tracking. The left ventricular internal cavity size was normal in size. There is moderate concentric left ventricular hypertrophy. Left ventricular diastolic parameters are indeterminate. Right Ventricle: The  right ventricular size is normal. Mildly increased right ventricular wall thickness. Right ventricular systolic function is normal. Tricuspid regurgitation signal is inadequate for assessing PA pressure. Left Atrium: Left atrial size was normal in size. Right Atrium: Right atrial size was normal in size. Pericardium: A moderately sized pericardial effusion is present. The pericardial effusion is circumferential. Mitral Valve: The mitral valve is grossly normal. Trivial mitral valve regurgitation. Tricuspid Valve: The tricuspid valve is grossly normal. Tricuspid valve regurgitation is mild. Aortic Valve: The aortic valve is tricuspid. Aortic valve regurgitation is not visualized. Pulmonic Valve: The pulmonic valve was grossly normal. Pulmonic valve regurgitation is trivial. Aorta: The aortic root and ascending aorta are structurally normal, with no evidence of dilitation. Venous: The inferior vena cava is normal in size with greater than 50% respiratory variability, suggesting right atrial pressure of 3 mmHg. IAS/Shunts: No atrial level shunt detected by color flow Doppler.  LEFT VENTRICLE PLAX 2D LVIDd:  5.20 cm      Diastology LVIDs:         4.00 cm      LV e' medial:  8.27 cm/s LV PW:         1.60 cm      LV e' lateral: 11.70 cm/s LV IVS:        1.10 cm LVOT diam:     2.00 cm LV SV:         76 LV SV Index:   39 LVOT Area:     3.14 cm  LV Volumes (MOD) LV vol d, MOD A4C: 167.0 ml LV vol s, MOD A4C: 75.8 ml LV SV MOD A4C:     167.0 ml RIGHT VENTRICLE             IVC RV Basal diam:  3.20 cm     IVC diam: 2.50 cm RV S prime:     15.60 cm/s TAPSE (M-mode): 2.0 cm LEFT ATRIUM           Index        RIGHT ATRIUM           Index LA Vol (A4C): 65.9 ml 33.82 ml/m  RA Area:     15.60 cm                                    RA Volume:   38.80 ml  19.91 ml/m  AORTIC VALVE LVOT Vmax:   162.00 cm/s LVOT Vmean:  105.000 cm/s LVOT VTI:    0.243 m  AORTA Ao Root diam: 3.10 cm Ao Asc diam:  3.40 cm  SHUNTS Systemic VTI:   0.24 m Systemic Diam: 2.00 cm Nona Dell MD Electronically signed by Nona Dell MD Signature Date/Time: 03/27/2023/7:28:07 PM    Final    DG Chest Port 1 View  Result Date: 03/27/2023 CLINICAL DATA:  Questionable sepsis - evaluate for abnormality EXAM: PORTABLE CHEST 1 VIEW COMPARISON:  January 05, 2023 FINDINGS: The cardiomediastinal silhouette is unchanged and enlarged in contour.RIGHT chest CVC with tip terminating over the RIGHT atrium. Perihilar vascular prominence, similar in comparison to priors. No pleural effusion. No pneumothorax. No acute pleuroparenchymal abnormality. IMPRESSION: No acute cardiopulmonary abnormality. Perihilar vascular congestion. Electronically Signed   By: Meda Klinefelter M.D.   On: 03/27/2023 17:07     Assessment and Plan:   Pericardial effusion -In the setting of missing nearly a week of dialysis as well as possible sepsis -He has been hyper tensive this admission, not consistent with clinical tamponade. -He denies chest pain or shortness of breath to me, other than some pinching at the site of his HD catheter. -Recommend dialysis today, and can recheck limited echo tomorrow just to look at the size of the effusion.  I would not expect that it should be significantly resolved with 1 session of dialysis, but we can make sure that it is not significantly enlarging with time. -In general, would recommend continued volume management with dialysis and then repeat limited echo can occur in the outpatient setting to monitor for resolution.  Possible sepsis -Management per primary team  Hypertension -In the setting of missing multiple dialysis sessions -Would pursue dialysis first, and then can adjust antihypertensives as needed per nephrology. -Continue current home meds.  For questions or updates, please contact [facility] Please consult [email or web address] for contact info under    Signed, Jodelle Red, MD  03/28/2023 2:06 PM

## 2023-03-28 NOTE — Progress Notes (Signed)
Pharmacy Antibiotic Note  [name] is a 37 y.o. male admitted on 03/27/2023 with fevers, possible sepsis.  Pharmacy has been consulted for Vancomycin  dosing.  Plan: Vancomycin 750 mg IV after each HD    Temp (24hrs), Avg:101.3 F (38.5 C), Min:99.5 F (37.5 C), Max:103.2 F (39.6 C)  Recent Labs  Lab 03/27/23 1828 03/27/23 1838  WBC 4.9  --   CREATININE 13.35* 14.10*  LATICACIDVEN  --  0.8    CrCl cannot be calculated (Unknown ideal weight.).    Allergies  Allergen Reactions   Zestril [Lisinopril] Swelling   Nsaids Other (See Comments)    "Kidney problems "   Risperdal [Risperidone] Other (See Comments)    Unknown reaction     [name] 03/28/2023 1:58 AM

## 2023-03-28 NOTE — Progress Notes (Addendum)
Went to put patient on me and the dialysis tech was in the bay, he said can he ask me a question. I said sure he said how long am I running. I said they have you down for 3.5 hour. He began yelling I said what's wrong 3:30 is that not what you normally run. He said I haven't ate can they bring me food. I said who? He replied his nurse,  he then said he couldn't hear me and pointed to his left ear. I got close to his left ear that he pointed to so he could hear me better and answered his question about his lunch. I let him know that dietary wont deliver trays here and we have sandwiches. He replied "oh my god". Then he said his catheter hurts. I said "try laying the other way does it not hurt when you lay on it. Like that turn to get off of it" He said take your mask off. I said I will not remove my mask. He began cursing and yelling I let charge know as the whole unit could hear him. He is now yelling cursing at the dialysis tech and the charge nurse. He then calls the dialysis tech over and began yelling at her calling her a  " white bitch". He then gets on the phone calling people communicate threats to the staff yelling and cursing stating that he would get up and come over to the assault the staff. Security was paged pt sent back to the ED due safety of staff and other patients on the dialysis unit.

## 2023-03-28 NOTE — Progress Notes (Signed)
Daily Progress Note Intern Pager: [phone]  Patient name: [name] Medical record number: [id number] Date of birth: 04/01/1986 Age: 37 y.o. Gender: male  Primary Care Provider: [name], DO Consultants: Cardiology, Vascular, Nephro Code Status: Full  Pt Overview and Major Events to Date:  -03/27/23 Admitted  Assessment and Plan:  [name] is a 37 y.o. male admitted for sepsis likely secondary to infected dialysis access. Patient additionally found to have moderate pericardial effusion, currently undergoing evaluation. 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  Assessment & Plan Sepsis, due to unspecified organism, unspecified whether acute organ dysfunction present (HCC) Afebrile, since admission. Suspect repeat bacteremia due to dialysis site infection. Consider endocarditis given new pericardial effusion.  -VVS consulted for right AV fistula, appreciate recs -Continue empiric Cefepime, Flagyl, Vancomycin -Tylenol prn -Follow-up Bcx, Wcx Hypertension, unspecified type Severe, resistant, uncontrolled. Hopefully, will improve with dialysis today. Continue home blood pressure regimen. Defer to nephrology for blood pressure control given ESRD. -amlodipine 10 mg, carvedilol 25 mg twice daily, diltiazem 300 mg daily, hydralazine 100 mg 3 times daily, losartan 100 mg daily  -Nephrology consulted, recs appreciated ESRD (end stage renal disease) on dialysis [facility]) Missed dialysis sessions and partial session. TTS HD patient, receiving dialysis through [facility]. CXR consistent with volume overload. -Nephrology consulted, recs appreciated -Dialysis today -Avoid nephrotoxic meds Pericardial effusion Suspect pericardial effusion is secondary to volume overload. ACS work-up for chest pain inconclusive with stable troponin's (ESRD), likely increased oxygen demand due to effusion and severely elevated blood pressures. Of note EKG with new  T-wave inversions on admission, cardiology on board.  Previously noted systolic murmur not heard today. -Cardiology consulted, recs appreciated -Repeat ECHO 24-48hrs Blood in stool Hgb stable. -Monitor  -Consider inpatient versus outpatient work-up with GI.    Chronic and Stable Issues: Alport syndrome - Bilateral sensorineural hearing loss Anemia - stable, Hgb 10.9, baseline in 7s Secondary hyperparathyroidism Bipolar 1 Disorder - no meds GERD - holding home protonix for now  FEN/GI: Renal PPx: Heparin Dispo: Pending clinical improvement.  Subjective:  Patient doing well, wishes to go home because he is moving into a new apartment on Tuesday.  Eating breakfast comfortably.  Objective: Temp:  [99.1 F (37.3 C)-103.2 F (39.6 C)] 99.1 F (37.3 C) (11/16 0608) Pulse Rate:  [89-120] 91 (11/16 0609) Resp:  [12-21] 20 (11/16 0609) BP: (151-224)/(81-145) 188/109 (11/16 0609) SpO2:  [93 %-100 %] 100 % (11/16 6301) Physical Exam: General: NAD, sitting comfortably in hospital bed Neuro: A&O Cardiovascular: RRR, no murmurs, mild 1+ pitting peripheral edema Respiratory: normal WOB on RA, CTAB, no wheezes, ronchi or rales Abdomen: soft, NTTP, no rebound or guarding Extremities: Moving all 4 extremities equally   Laboratory: Most recent CBC Lab Results  Component Value Date   WBC 6.2 03/28/2023   HGB 9.5 (L) 03/28/2023   HCT 30.3 (L) 03/28/2023   MCV 92.1 03/28/2023   PLT 111 (L) 03/28/2023   Most recent BMP    Latest Ref Rng & Units 03/28/2023    4:15 AM  BMP  Glucose 70 - 99 mg/dL 97   BUN 6 - 20 mg/dL 71   Creatinine 6.01 - 1.24 mg/dL 09.32   Sodium 355 - 732 mmol/L 140   Potassium 3.5 - 5.1 mmol/L 4.7   Chloride 98 - 111 mmol/L 99   CO2 22 - 32 mmol/L 24   Calcium 8.9 - 10.3 mg/dL 9.2     Aerobic Wound Culture -  Prelim negative Troponin - 110->143->125 Bcx pending HIV negative Hep B negative  Imaging/Diagnostic Tests: No new imaging.  Celine Mans, MD 03/28/2023, 8:09 AM  PGY-2, [facility] FPTS Intern pager: [phone], text pages welcome Secure chat group [facility] Teaching Service

## 2023-03-28 NOTE — Assessment & Plan Note (Signed)
BP range on admission 180s-220s/110s-140s.  Suspect this is due to missed dialysis and noncompliance to home medication.  Complains of headache, this is chronic, CT head negative - Nephrology to do HD in the morning - Continue home medication-amlodipine 10 mg, carvedilol 25 mg twice daily, diltiazem 300 mg daily, hydralazine 100 mg 3 times daily, losartan 100 mg daily - Vitals per floor protocol

## 2023-03-28 NOTE — ED Notes (Signed)
ED TO INPATIENT HANDOFF REPORT  ED Nurse Name and Phone #: [phone]  S Name/Age/Gender [name] 37 y.o. male Room/Bed: 011C/011C  Code Status   Code Status: Full Code  Home/SNF/Other Home Patient oriented to: self, place, time, and situation Is this baseline? Yes   Triage Complete: Triage complete  Chief Complaint Sepsis [facility]) [A41.9]  Triage Note Initially unresponsive in dialysis center. Pt only had 5kg fluid removed and passed out. Did not finish dialysis. Initial bp was 268/160. HR in the 80s. Pt is now awake and alert. 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 Fever of 102.   Allergies Allergies  Allergen Reactions   Zestril [Lisinopril] Swelling   Nsaids Other (See Comments)    "Kidney problems "   Risperdal [Risperidone] Other (See Comments)    Unknown reaction     Level of Care/Admitting Diagnosis ED Disposition     ED Disposition  Admit   Condition  --   Comment  Hospital Area: [facility] [[id number]]  Level of Care: Progressive [102]  Admit to Progressive based on following criteria: MULTISYSTEM THREATS such as stable sepsis, metabolic/electrolyte imbalance with or without encephalopathy that is responding to early treatment.  Admit to Progressive based on following criteria: NEPHROLOGY stable condition requiring close monitoring for AKI, requiring Hemodialysis or Peritoneal Dialysis either from expected electrolyte imbalance, acidosis, or fluid overload that can be managed by NIPPV or high flow oxygen.  Admit to Progressive based on following criteria: NEUROLOGICAL AND NEUROSURGICAL complex patients with significant risk of instability, who do not meet ICU criteria, yet require close observation or frequent assessment (< / = every 2 - 4 hours) with medical / nursing intervention.  May admit patient to [facility] or [facility] if equivalent level of care is available:: No  Covid Evaluation: Confirmed COVID Negative  Diagnosis:  Sepsis [facility]) [[id number]]  Admitting Physician: Para March [[id number]]  Attending Physician: Janit Pagan T [[id number]]  Certification:: I certify this patient will need inpatient services for at least 2 midnights  Expected Medical Readiness: 03/31/2023          B Medical/Surgery History Past Medical History:  Diagnosis Date   Bipolar 1 disorder (HCC)    Depression    ESRD on hemodialysis (HCC)    HD at [facility] on TTS schedule   GERD (gastroesophageal reflux disease)    Hearing difficulty of left ear    75% hearing   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   Hypertension    Low blood sugar    Past Surgical History:  Procedure Laterality Date   A/V FISTULAGRAM Right 10/27/2022   Procedure: A/V Fistulagram;  Surgeon: Chuck Hint, MD;  Location: [facility] INVASIVE CV LAB;  Service: Cardiovascular;  Laterality: Right;   APPENDECTOMY     AV FISTULA PLACEMENT Left 03/03/2019   Procedure: ARTERIOVENOUS (AV) FISTULA CREATION LEFT ARM;  Surgeon: Maeola Harman, MD;  Location: [facility] OR;  Service: Vascular;  Laterality: Left;   BASCILIC VEIN TRANSPOSITION Right 04/12/2019   Procedure: RIGHT UPPER EXTREMITY BASCILIC VEIN TRANSPOSITION FIRST STAGE FISTULA;  Surgeon: Chuck Hint, MD;  Location: [facility] OR;  Service: Vascular;  Laterality: Right;   BIOPSY  10/14/2021   Procedure: BIOPSY;  Surgeon: Sherrilyn Rist, MD;  Location: [facility] ENDOSCOPY;  Service: Gastroenterology;;   BIOPSY  06/06/2022   Procedure: BIOPSY;  Surgeon: Napoleon Form, MD;  Location: [facility] ENDOSCOPY;  Service: Gastroenterology;;   BIOPSY  11/12/2022   Procedure: BIOPSY;  Surgeon: Lemar Lofty., MD;  Location: [facility] ENDOSCOPY;  Service: Gastroenterology;;   BIOPSY  12/05/2022   Procedure: BIOPSY;  Surgeon: [name], MD;  Location: [facility] ENDOSCOPY;  Service: Gastroenterology;;   COLONOSCOPY WITH PROPOFOL N/A 10/14/2021   Procedure: COLONOSCOPY WITH PROPOFOL;  Surgeon: Sherrilyn Rist, MD;   Location: [facility] ENDOSCOPY;  Service: Gastroenterology;  Laterality: N/A;   ESOPHAGOGASTRODUODENOSCOPY N/A 11/12/2022   Procedure: ESOPHAGOGASTRODUODENOSCOPY (EGD);  Surgeon: Lemar Lofty., MD;  Location: [facility] ENDOSCOPY;  Service: Gastroenterology;  Laterality: N/A;   ESOPHAGOGASTRODUODENOSCOPY N/A 12/05/2022   Procedure: ESOPHAGOGASTRODUODENOSCOPY (EGD);  Surgeon: [name], MD;  Location: [facility] ENDOSCOPY;  Service: Gastroenterology;  Laterality: N/A;   ESOPHAGOGASTRODUODENOSCOPY (EGD) WITH PROPOFOL N/A 06/06/2022   Procedure: ESOPHAGOGASTRODUODENOSCOPY (EGD) WITH PROPOFOL;  Surgeon: Napoleon Form, MD;  Location: [facility] ENDOSCOPY;  Service: Gastroenterology;  Laterality: N/A;   FISTULA SUPERFICIALIZATION Right 11/07/2022   Procedure: PLICATION OF LARGE ANEURYSMS OF RIGHT ARTERIOVENOUS FISTULA WITH INSERTION OF 7mm INTERPOSITIONAL GORTEX GRAFT;  Surgeon: Chuck Hint, MD;  Location: [facility] OR;  Service: Vascular;  Laterality: Right;   FLEXIBLE SIGMOIDOSCOPY N/A 04/14/2022   Procedure: FLEXIBLE SIGMOIDOSCOPY;  Surgeon: Napoleon Form, MD;  Location: [facility] ENDOSCOPY;  Service: Gastroenterology;  Laterality: N/A;   INSERTION OF DIALYSIS CATHETER N/A 11/07/2022   Procedure: INSERTION OF RIGHT INTERNAL JUGULAR TUNNELED DIALYSIS CATHETER;  Surgeon: Chuck Hint, MD;  Location[facility] OR;  Service: Vascular;  Laterality: N/A;   LIGATION OF ARTERIOVENOUS  FISTULA Left 03/06/2019   Procedure: LIGATION OF ARTERIOVENOUS  FISTULA;  Surgeon: Cephus Shelling, MD;  Location: [facility] OR;  Service: Vascular;  Laterality: Left;   PERIPHERAL VASCULAR BALLOON ANGIOPLASTY  10/27/2022   Procedure: PERIPHERAL VASCULAR BALLOON ANGIOPLASTY;  Surgeon: Chuck Hint, MD;  Location: [facility] INVASIVE CV LAB;  Service: Cardiovascular;;  rt upper arm fistula   SAVORY DILATION N/A 11/12/2022   Procedure: SAVORY DILATION;  Surgeon: Meridee Score Netty Starring., MD;  Location: [facility] ENDOSCOPY;  Service:  Gastroenterology;  Laterality: N/A;   spinal tap     SPINE SURGERY     related to a spinal infection, unsure of surgery or infection source   WISDOM TOOTH EXTRACTION       A IV Location/Drains/Wounds Patient Lines/Drains/Airways Status     Active Line/Drains/Airways     Name Placement date Placement time Site Days   Peripheral IV 03/27/23 20 G 1.88" Left Antecubital 03/27/23  1827  Antecubital  1   Fistula / Graft Right Upper arm Arteriovenous fistula 04/12/19  1105  Upper arm  1446   Hemodialysis Catheter Right Internal jugular Double lumen Permanent (Tunneled) 11/07/22  0816  Internal jugular  141            Intake/Output Last 24 hours  Intake/Output Summary (Last 24 hours) at 03/28/2023 1323 Last data filed at 03/28/2023 1123 Gross per 24 hour  Intake 101.17 ml  Output --  Net 101.17 ml    Labs/Imaging Results for orders placed or performed during the hospital encounter of 03/27/23 (from the past 48 hour(s))  CBG monitoring, ED     Status: None   Collection Time: 03/27/23  4:20 PM  Result Value Ref Range   Glucose-Capillary 86 70 - 99 mg/dL    Comment: Glucose reference range applies only to samples taken after fasting for at least 8 hours.  Comprehensive metabolic panel     Status: Abnormal   Collection Time: 03/27/23  6:28 PM  Result Value Ref Range   Sodium 137 135 - 145 mmol/L   Potassium 4.7 3.5 - 5.1 mmol/L   Chloride 97 (L) 98 - 111 mmol/L   CO2 24 22 - 32 mmol/L   Glucose, Bld 84 70 - 99 mg/dL    Comment: Glucose reference range applies only to samples taken after fasting for at least 8 hours.   BUN 58 (H) 6 - 20 mg/dL   Creatinine, Ser 16.10 (H) 0.61 - 1.24 mg/dL   Calcium 9.1 8.9 - 96.0 mg/dL   Total Protein 6.6 6.5 - 8.1 g/dL   Albumin 3.2 (L) 3.5 - 5.0 g/dL   AST 25 15 - 41 U/L   ALT 16 0 - 44 U/L   Alkaline Phosphatase 69 38 - 126 U/L   Total Bilirubin 0.7 <1.2 mg/dL   GFR, Estimated 4 (L) >60 mL/min    Comment: (NOTE) Calculated using the  CKD-EPI Creatinine Equation (2021)    Anion gap 16 (H) 5 - 15    Comment: Performed at [facility] Lab, [address], [place], [place] [address]  CBC with Differential     Status: Abnormal   Collection Time: 03/27/23  6:28 PM  Result Value Ref Range   WBC 4.9 4.0 - 10.5 K/uL   RBC 3.37 (L) 4.22 - 5.81 MIL/uL   Hemoglobin 10.0 (L) 13.0 - 17.0 g/dL   HCT 81.1 (L) 91.4 - 78.2 %   MCV 90.8 80.0 - 100.0 fL   MCH 29.7 26.0 - 34.0 pg   MCHC 32.7 30.0 - 36.0 g/dL   RDW 95.6 (H) 21.3 - 08.6 %   Platelets 133 (L) 150 - 400 K/uL   nRBC 0.0 0.0 - 0.2 %   Neutrophils Relative % 81 %   Neutro Abs 3.9 1.7 - 7.7 K/uL   Lymphocytes Relative 7 %   Lymphs Abs 0.4 (L) 0.7 - 4.0 K/uL   Monocytes Relative 11 %   Monocytes Absolute 0.6 0.1 - 1.0 K/uL   Eosinophils Relative 0 %   Eosinophils Absolute 0.0 0.0 - 0.5 K/uL   Basophils Relative 0 %   Basophils Absolute 0.0 0.0 - 0.1 K/uL   Immature Granulocytes 1 %   Abs Immature Granulocytes 0.03 0.00 - 0.07 K/uL    Comment: Performed at [facility] Lab, [address] [place], [place] [address]  Protime-INR     Status: None   Collection Time: 03/27/23  6:28 PM  Result Value Ref Range   Prothrombin Time 14.9 11.4 - 15.2 seconds   INR 1.2 0.8 - 1.2    Comment: (NOTE) INR goal varies based on device and disease states. Performed at [facility], [address] [place], [place] [address]   APTT     Status: None   Collection Time: 03/27/23  6:28 PM  Result Value Ref Range   aPTT 36 24 - 36 seconds    Comment: Performed at [facility], [address] [place], [place] [address]  Troponin I (High Sensitivity)     Status: Abnormal   Collection Time: 03/27/23  6:28 PM  Result Value Ref Range   Troponin I (High Sensitivity) 110 (HH) <18 ng/L    Comment: CRITICAL RESULT CALLED TO, READ BACK BY AND VERIFIED WITH A JAMES RN 03/27/2023 1932 BNUNNERY (NOTE) Elevated high sensitivity troponin I (hsTnI) values and significant  changes  across serial measurements may suggest ACS but many other  chronic and acute conditions  are known to elevate hsTnI results.  Refer to the "Links" section for chest pain algorithms and additional  guidance. Performed at [facility], [address] [place], [place] [address]   Resp panel by RT-PCR (RSV, Flu A&B, Covid) Anterior Nasal Swab     Status: None   Collection Time: 03/27/23  6:30 PM   Specimen: Anterior Nasal Swab  Result Value Ref Range   SARS Coronavirus 2 by RT PCR NEGATIVE NEGATIVE   Influenza A by PCR NEGATIVE NEGATIVE   Influenza B by PCR NEGATIVE NEGATIVE    Comment: (NOTE) The Xpert Xpress SARS-CoV-2/FLU/RSV plus assay is intended as an aid in the diagnosis of influenza from Nasopharyngeal swab specimens and should not be used as a sole basis for treatment. Nasal washings and aspirates are unacceptable for Xpert Xpress SARS-CoV-2/FLU/RSV testing.  Fact Sheet for Patients: BloggerCourse.com  Fact Sheet for Healthcare Providers: SeriousBroker.it  This test is not yet approved or cleared by the [place] FDA and has been authorized for detection and/or diagnosis of SARS-CoV-2 by FDA under an Emergency Use Authorization (EUA). This EUA will remain in effect (meaning this test can be used) for the duration of the COVID-19 declaration under Section 564(b)(1) of the Act, 21 U.S.C. section 360bbb-3(b)(1), unless the authorization is terminated or revoked.     Resp Syncytial Virus by PCR NEGATIVE NEGATIVE    Comment: (NOTE) Fact Sheet for Patients: BloggerCourse.com  Fact Sheet for Healthcare Providers: SeriousBroker.it  This test is not yet approved or cleared by the [place] FDA and has been authorized for detection and/or diagnosis of SARS-CoV-2 by FDA under an Emergency Use Authorization (EUA). This EUA will remain in effect (meaning this test can  be used) for the duration of the COVID-19 declaration under Section 564(b)(1) of the Act, 21 U.S.C. section 360bbb-3(b)(1), unless the authorization is terminated or revoked.  Performed at [facility], [address] [place], [place] [address]   Blood Culture (routine x 2)     Status: None (Preliminary result)   Collection Time: 03/27/23  6:30 PM   Specimen: BLOOD  Result Value Ref Range   Specimen Description BLOOD SITE NOT SPECIFIED    Special Requests      BOTTLES DRAWN AEROBIC AND ANAEROBIC Blood Culture adequate volume   Culture      NO GROWTH < 24 HOURS Performed at [facility], [address] [place], [place] [address]    Report Status PENDING   Brain natriuretic peptide     Status: Abnormal   Collection Time: 03/27/23  6:30 PM  Result Value Ref Range   B Natriuretic Peptide 721.3 (H) 0.0 - 100.0 pg/mL    Comment: Performed at [facility] Lab, [address] [place], [place] [address]  I-Stat Lactic Acid, ED     Status: None   Collection Time: 03/27/23  6:38 PM  Result Value Ref Range   Lactic Acid, Venous 0.8 0.5 - 1.9 mmol/L  I-stat chem 8, ED (not at [facility], [facility] or [facility])     Status: Abnormal   Collection Time: 03/27/23  6:38 PM  Result Value Ref Range   Sodium 137 135 - 145 mmol/L   Potassium 4.5 3.5 - 5.1 mmol/L   Chloride 97 (L) 98 - 111 mmol/L   BUN 53 (H) 6 - 20 mg/dL   Creatinine, Ser 84.69 (H) 0.61 - 1.24 mg/dL   Glucose, Bld 81 70 - 99 mg/dL    Comment:  Glucose reference range applies only to samples taken after fasting for at least 8 hours.   Calcium, Ion 1.05 (L) 1.15 - 1.40 mmol/L   TCO2 27 22 - 32 mmol/L   Hemoglobin 10.9 (L) 13.0 - 17.0 g/dL   HCT 14.7 (L) 82.9 - 56.2 %  Troponin I (High Sensitivity)     Status: Abnormal   Collection Time: 03/27/23 10:55 PM  Result Value Ref Range   Troponin I (High Sensitivity) 143 (HH) <18 ng/L    Comment: CRITICAL VALUE NOTED. VALUE IS CONSISTENT WITH PREVIOUSLY REPORTED/CALLED  VALUE (NOTE) Elevated high sensitivity troponin I (hsTnI) values and significant  changes across serial measurements may suggest ACS but many other  chronic and acute conditions are known to elevate hsTnI results.  Refer to the "Links" section for chest pain algorithms and additional  guidance. Performed at [facility], [address] [place], [place] [address]   Procalcitonin     Status: None   Collection Time: 03/27/23 10:55 PM  Result Value Ref Range   Procalcitonin 7.19 ng/mL    Comment:        Interpretation: PCT > 2 ng/mL: Systemic infection (sepsis) is likely, unless other causes are known. (NOTE)       Sepsis PCT Algorithm           Lower Respiratory Tract                                      Infection PCT Algorithm    ----------------------------     ----------------------------         PCT < 0.25 ng/mL                PCT < 0.10 ng/mL          Strongly encourage             Strongly discourage   discontinuation of antibiotics    initiation of antibiotics    ----------------------------     -----------------------------       PCT 0.25 - 0.50 ng/mL            PCT 0.10 - 0.25 ng/mL               OR       >80% decrease in PCT            Discourage initiation of                                            antibiotics      Encourage discontinuation           of antibiotics    ----------------------------     -----------------------------         PCT >= 0.50 ng/mL              PCT 0.26 - 0.50 ng/mL               AND       <80% decrease in PCT              Encourage initiation of  antibiotics       Encourage continuation           of antibiotics    ----------------------------     -----------------------------        PCT >= 0.50 ng/mL                  PCT > 0.50 ng/mL               AND         increase in PCT                  Strongly encourage                                      initiation of antibiotics    Strongly  encourage escalation           of antibiotics                                     -----------------------------                                           PCT <= 0.25 ng/mL                                                 OR                                        > 80% decrease in PCT                                      Discontinue / Do not initiate                                             antibiotics  Performed at [facility], [address], [place], [place] [address]   Hepatitis B surface antigen     Status: None   Collection Time: 03/27/23 10:55 PM  Result Value Ref Range   Hepatitis B Surface Ag NON REACTIVE NON REACTIVE    Comment: Performed at [facility], [address], [place], [place] [address]  HIV Antibody (routine testing w rflx)     Status: None   Collection Time: 03/27/23 10:55 PM  Result Value Ref Range   HIV Screen 4th Generation wRfx Non Reactive Non Reactive    Comment: Performed at [facility], [address], [place], [place] [address]  Aerobic Culture w Gram Stain (superficial specimen)     Status: None (Preliminary result)   Collection Time: 03/27/23 11:05 PM   Specimen: Wound  Result Value Ref Range   Specimen Description WOUND    Special Requests LEFT ARM    Gram Stain NO WBC SEEN NO  ORGANISMS SEEN     Culture      NO GROWTH < 12 HOURS Performed at [facility], [address], [place], [place] [address]    Report Status PENDING   CBC     Status: Abnormal   Collection Time: 03/28/23  4:15 AM  Result Value Ref Range   WBC 6.2 4.0 - 10.5 K/uL   RBC 3.29 (L) 4.22 - 5.81 MIL/uL   Hemoglobin 9.5 (L) 13.0 - 17.0 g/dL   HCT 60.4 (L) 54.0 - 98.1 %   MCV 92.1 80.0 - 100.0 fL   MCH 28.9 26.0 - 34.0 pg   MCHC 31.4 30.0 - 36.0 g/dL   RDW 19.1 (H) 47.8 - 29.5 %   Platelets 111 (L) 150 - 400 K/uL   nRBC 0.0 0.0 - 0.2 %    Comment: Performed at [facility], [address], [place], [place] [address]  Renal function  panel     Status: Abnormal   Collection Time: 03/28/23  4:15 AM  Result Value Ref Range   Sodium 140 135 - 145 mmol/L   Potassium 4.7 3.5 - 5.1 mmol/L   Chloride 99 98 - 111 mmol/L   CO2 24 22 - 32 mmol/L   Glucose, Bld 97 70 - 99 mg/dL    Comment: Glucose reference range applies only to samples taken after fasting for at least 8 hours.   BUN 71 (H) 6 - 20 mg/dL   Creatinine, Ser 86.57 (H) 0.61 - 1.24 mg/dL   Calcium 9.2 8.9 - 84.6 mg/dL   Phosphorus 96.2 (H) 2.5 - 4.6 mg/dL   Albumin 2.9 (L) 3.5 - 5.0 g/dL   GFR, Estimated 4 (L) >60 mL/min    Comment: (NOTE) Calculated using the CKD-EPI Creatinine Equation (2021)    Anion gap 17 (H) 5 - 15    Comment: Performed at [facility], [address], [place], [place] [address]  Troponin I (High Sensitivity)     Status: Abnormal   Collection Time: 03/28/23  4:15 AM  Result Value Ref Range   Troponin I (High Sensitivity) 125 (HH) <18 ng/L    Comment: CRITICAL VALUE NOTED. VALUE IS CONSISTENT WITH PREVIOUSLY REPORTED/CALLED VALUE (NOTE) Elevated high sensitivity troponin I (hsTnI) values and significant  changes across serial measurements may suggest ACS but many other  chronic and acute conditions are known to elevate hsTnI results.  Refer to the "Links" section for chest pain algorithms and additional  guidance. Performed at [facility], [address] [place], [place] [address]   Troponin I (High Sensitivity)     Status: Abnormal   Collection Time: 03/28/23  6:14 AM  Result Value Ref Range   Troponin I (High Sensitivity) 125 (HH) <18 ng/L    Comment: CRITICAL VALUE NOTED. VALUE IS CONSISTENT WITH PREVIOUSLY REPORTED/CALLED VALUE (NOTE) Elevated high sensitivity troponin I (hsTnI) values and significant  changes across serial measurements may suggest ACS but many other  chronic and acute conditions are known to elevate hsTnI results.  Refer to the "Links" section for chest pain algorithms and additional   guidance. Performed at [facility], [address], [place], [place] [address]    CT HEAD WO CONTRAST ( )  Result Date: 03/27/2023 CLINICAL DATA:  Headache, fever EXAM: CT HEAD WITHOUT CONTRAST TECHNIQUE: Contiguous axial images were obtained from the base of the skull through the vertex without intravenous contrast. RADIATION DOSE REDUCTION: This exam was performed according  to the departmental dose-optimization program which includes automated exposure control, adjustment of the mA and/or kV according to patient size and/or use of iterative reconstruction technique. COMPARISON:  01/06/2023 FINDINGS: Brain: No acute intracranial abnormality. Specifically, no hemorrhage, hydrocephalus, mass lesion, acute infarction, or significant intracranial injury. Vascular: No hyperdense vessel or unexpected calcification. Skull: No acute calvarial abnormality. Sinuses/Orbits: No acute findings Other: None IMPRESSION: No acute intracranial abnormality. Electronically Signed   By: Charlett Nose M.D.   On: 03/27/2023 22:33   ECHOCARDIOGRAM COMPLETE  Result Date: 03/27/2023    ECHOCARDIOGRAM REPORT   Patient Name:   [name] [facility] Date of Exam: 03/27/2023 Medical Rec #:  [id number]       Height:       69.0 in Accession #:    [id number]      Weight:       174.2 lb Date of Birth:  01/27/1986       BSA:          1.948 m Patient Age:    37 years        BP:           183/126 mmHg Patient Gender: M               HR:           110 bpm. Exam Location:  Inpatient Procedure: 2D Echo, Color Doppler and Cardiac Doppler STAT ECHO Indications:    murmur.  History:        Patient has prior history of Echocardiogram examinations, most                 recent 06/03/2022. End stage renal disease, Signs/Symptoms:Chest                 Pain and Shortness of Breath; Risk Factors:Current Smoker.  Sonographer:    Delcie Roch RDCS Referring Phys: [id number] CHRISTOPHER J TEGELER IMPRESSIONS  1. Left ventricular ejection fraction, by  estimation, is approximately 55%. The left ventricle has normal function. The left ventricle has no regional wall motion abnormalities. There is moderate concentric left ventricular hypertrophy.  2. Right ventricular systolic function is normal. The right ventricular size is normal. Mildly increased right ventricular wall thickness. Tricuspid regurgitation signal is inadequate for assessing PA pressure.  3. Turbulent and prominent flow noted in LVOT and RVOT with possible dilatation of PA. No obvious left ot right shunt observed. Most likely related to high flow state based on available images.  4. Moderate pericardial effusion, increased compared to study from January. The pericardial effusion is circumferential. Majority of collection is posterolateral to left ventricle and at right atrium. There is approximately 20% change in mitral inflow and 60% change in tricuspid inflow with respiration suggesting hemodynamic significance. Also early diastolic compression (1/3) of right atrium, but no right ventricular compression. Although not diagnostic of tamponade, would follow clinically and consider reimaging within 24-48 hours.  5. The mitral valve is grossly normal. Trivial mitral valve regurgitation.  6. The aortic valve is tricuspid. Aortic valve regurgitation is not visualized.  7. The inferior vena cava is normal in size with greater than 50% respiratory variability, suggesting right atrial pressure of 3 mmHg. FINDINGS  Left Ventricle: Left ventricular ejection fraction, by estimation, is 55%. The left ventricle has normal function. The left ventricle has no regional wall motion abnormalities. Global longitudinal strain performed but not reported based on interpreter judgement due to suboptimal tracking. The left ventricular internal cavity size was normal in size. There  is moderate concentric left ventricular hypertrophy. Left ventricular diastolic parameters are indeterminate. Right Ventricle: The right  ventricular size is normal. Mildly increased right ventricular wall thickness. Right ventricular systolic function is normal. Tricuspid regurgitation signal is inadequate for assessing PA pressure. Left Atrium: Left atrial size was normal in size. Right Atrium: Right atrial size was normal in size. Pericardium: A moderately sized pericardial effusion is present. The pericardial effusion is circumferential. Mitral Valve: The mitral valve is grossly normal. Trivial mitral valve regurgitation. Tricuspid Valve: The tricuspid valve is grossly normal. Tricuspid valve regurgitation is mild. Aortic Valve: The aortic valve is tricuspid. Aortic valve regurgitation is not visualized. Pulmonic Valve: The pulmonic valve was grossly normal. Pulmonic valve regurgitation is trivial. Aorta: The aortic root and ascending aorta are structurally normal, with no evidence of dilitation. Venous: The inferior vena cava is normal in size with greater than 50% respiratory variability, suggesting right atrial pressure of 3 mmHg. IAS/Shunts: No atrial level shunt detected by color flow Doppler.  LEFT VENTRICLE PLAX 2D LVIDd:         5.20 cm      Diastology LVIDs:         4.00 cm      LV e' medial:  8.27 cm/s LV PW:         1.60 cm      LV e' lateral: 11.70 cm/s LV IVS:        1.10 cm LVOT diam:     2.00 cm LV SV:         76 LV SV Index:   39 LVOT Area:     3.14 cm  LV Volumes (MOD) LV vol d, MOD A4C: 167.0 ml LV vol s, MOD A4C: 75.8 ml LV SV MOD A4C:     167.0 ml RIGHT VENTRICLE             IVC RV Basal diam:  3.20 cm     IVC diam: 2.50 cm RV S prime:     15.60 cm/s TAPSE (M-mode): 2.0 cm LEFT ATRIUM           Index        RIGHT ATRIUM           Index LA Vol (A4C): 65.9 ml 33.82 ml/m  RA Area:     15.60 cm                                    RA Volume:   38.80 ml  19.91 ml/m  AORTIC VALVE LVOT Vmax:   162.00 cm/s LVOT Vmean:  105.000 cm/s LVOT VTI:    0.243 m  AORTA Ao Root diam: 3.10 cm Ao Asc diam:  3.40 cm  SHUNTS Systemic VTI:  0.24 m  Systemic Diam: 2.00 cm Nona Dell MD Electronically signed by Nona Dell MD Signature Date/Time: 03/27/2023/7:28:07 PM    Final    DG Chest Port 1 View  Result Date: 03/27/2023 CLINICAL DATA:  Questionable sepsis - evaluate for abnormality EXAM: PORTABLE CHEST 1 VIEW COMPARISON:  January 05, 2023 FINDINGS: The cardiomediastinal silhouette is unchanged and enlarged in contour.RIGHT chest CVC with tip terminating over the RIGHT atrium. Perihilar vascular prominence, similar in comparison to priors. No pleural effusion. No pneumothorax. No acute pleuroparenchymal abnormality. IMPRESSION: No acute cardiopulmonary abnormality. Perihilar vascular congestion. Electronically Signed   By: Meda Klinefelter M.D.   On: 03/27/2023 17:07    Pending  Labs [employer] (From admission, onward)     Start     Ordered   03/29/23 0500  CBC  Tomorrow morning,   R        03/28/23 1035   03/29/23 0500  Basic metabolic panel  Tomorrow morning,   R        03/28/23 1035   03/28/23 1027  Renal function panel  Once,   R        03/28/23 1026   03/28/23 1027  CBC  Once,   R        03/28/23 1026   03/27/23 2158  Hepatitis B surface antibody,quantitative  (New Admission Hemo Labs (Hepatitis B))  Once,   R        03/27/23 2158   03/27/23 1635  Urinalysis, w/ Reflex to Culture (Infection Suspected) -Urine, Clean Catch  (Septic presentation on arrival (screening labs, nursing and treatment orders for obvious sepsis))  ONCE - URGENT,   URGENT       Question:  Specimen Source  Answer:  Urine, Clean Catch   03/27/23 1638   03/27/23 1634  Blood Culture (routine x 2)  (Septic presentation on arrival (screening labs, nursing and treatment orders for obvious sepsis))  BLOOD CULTURE X 2,   STAT      03/27/23 1638            Vitals/Pain Today's Vitals   03/28/23 1000 03/28/23 1030 03/28/23 1039 03/28/23 1131  BP: (!) 170/88 (!) 158/91    Pulse: 94 89    Resp: (!) 26 20    Temp:    98.1 F (36.7 C)   TempSrc:    Oral  SpO2: 98% 99%    PainSc:   3      Isolation Precautions No active isolations  Medications Medications  amLODipine (NORVASC) tablet 10 mg (10 mg Oral Given 03/28/23 0927)  carvedilol (COREG) tablet 25 mg (25 mg Oral Given 03/28/23 0926)  diltiazem (CARDIZEM CD) 24 hr capsule 300 mg (300 mg Oral Given 03/28/23 0926)  hydrALAZINE (APRESOLINE) tablet 100 mg (100 mg Oral Given 03/28/23 0557)  losartan (COZAAR) tablet 100 mg (100 mg Oral Given 03/28/23 0926)  heparin injection 5,000 Units (5,000 Units Subcutaneous Given 03/28/23 0557)  acetaminophen (TYLENOL) tablet 650 mg (650 mg Oral Given 03/28/23 0600)    Or  acetaminophen (TYLENOL) suppository 650 mg ( Rectal See Alternative 03/28/23 0600)  Chlorhexidine Gluconate Cloth 2 % PADS 6 each (0 each Topical Hold 03/28/23 0602)  pentafluoroprop-tetrafluoroeth (GEBAUERS) aerosol 1 Application (has no administration in time range)  lidocaine (PF) (XYLOCAINE) 1 % injection 5 mL (has no administration in time range)  lidocaine-prilocaine (EMLA) cream 1 Application (has no administration in time range)  heparin injection 1,000 Units (has no administration in time range)  anticoagulant sodium citrate solution 5 mL (has no administration in time range)  alteplase (CATHFLO ACTIVASE) injection 2 mg (has no administration in time range)  metroNIDAZOLE (FLAGYL) IVPB 500 mg (0 mg Intravenous Stopped 03/28/23 1123)  ceFEPIme (MAXIPIME) 1 g in sodium chloride 0.9 % 100 mL IVPB (has no administration in time range)  vancomycin (VANCOREADY) IVPB 750 mg/150 mL (0 mg Intravenous Hold 03/28/23 1157)  Darbepoetin Alfa (ARANESP) injection 150 mcg (has no administration in time range)  ceFEPIme (MAXIPIME) 2 g in sodium chloride 0.9 % 100 mL IVPB (0 g Intravenous Stopped 03/27/23 1910)  metroNIDAZOLE (FLAGYL) IVPB 500 mg (0 mg Intravenous Stopped 03/27/23 2022)  vancomycin (VANCOREADY) IVPB 1500 mg/300 mL (  0 mg Intravenous Stopped 03/27/23 2258)   acetaminophen (TYLENOL) tablet 650 mg (650 mg Oral Given 03/27/23 1837)  fentaNYL (SUBLIMAZE) injection 50 mcg (50 mcg Intravenous Given 03/27/23 1837)    Mobility walks     Focused Assessments Renal Assessment Handoff:  Hemodialysis Schedule: Hemodialysis Schedule: Tuesday/Thursday/Saturday Last Hemodialysis date and time:    Restricted appendage: right arm   R Recommendations: See Admitting Provider Note  Report given to:   Additional Notes: pt was sent back from dialysis due to disruptive behavior.

## 2023-03-28 NOTE — Plan of Care (Signed)
Called patient's cousin Aiyden Bartosch.  Provided with update regarding current treatment plans.  All questions answered.

## 2023-03-28 NOTE — Assessment & Plan Note (Signed)
Moderate circumferential pericardial effusion noted on echo - Cardiology consulted, appreciate recommendations - Repeat echo in 24 to 48 hours - Monitor for signs and symptoms of worsening pericardial effusion and consult cardiology as needed

## 2023-03-28 NOTE — Progress Notes (Signed)
Transport to HD via bed/transporter. Cardiac monitor maintained

## 2023-03-28 NOTE — Progress Notes (Signed)
Dr Glenna Fellows aware of situation in [place]. Night staff will try to do dialysis.

## 2023-03-28 NOTE — Assessment & Plan Note (Signed)
Missed dialysis sessions and partial session. TTS HD patient, receiving dialysis through [facility]. CXR consistent with volume overload. -Nephrology consulted, recs appreciated -Dialysis today -Avoid nephrotoxic meds

## 2023-03-28 NOTE — Progress Notes (Signed)
PHARMACY - PHYSICIAN COMMUNICATION CRITICAL VALUE ALERT - BLOOD CULTURE IDENTIFICATION (BCID)  [name] is an 37 y.o. male who presented to [facility] on 03/27/2023 with a chief complaint of sepsis  Assessment:  likely contaminant (include suspected source if known)  Name of physician (or Provider) Contacted: Velna Ochs  Current antibiotics: vancomycin + cefepime  Changes to prescribed antibiotics recommended:  Patient is on recommended antibiotics - No changes needed  Results for orders placed or performed during the hospital encounter of 03/27/23  Blood Culture ID Panel (Reflexed) (Collected: 03/27/2023  6:30 PM)  Result Value Ref Range   Enterococcus faecalis NOT DETECTED NOT DETECTED   Enterococcus Faecium NOT DETECTED NOT DETECTED   Listeria monocytogenes NOT DETECTED NOT DETECTED   Staphylococcus species NOT DETECTED NOT DETECTED   Staphylococcus aureus (BCID) NOT DETECTED NOT DETECTED   Staphylococcus epidermidis NOT DETECTED NOT DETECTED   Staphylococcus lugdunensis NOT DETECTED NOT DETECTED   Streptococcus species NOT DETECTED NOT DETECTED   Streptococcus agalactiae NOT DETECTED NOT DETECTED   Streptococcus pneumoniae NOT DETECTED NOT DETECTED   Streptococcus pyogenes NOT DETECTED NOT DETECTED   A.calcoaceticus-baumannii NOT DETECTED NOT DETECTED   Bacteroides fragilis NOT DETECTED NOT DETECTED   Enterobacterales NOT DETECTED NOT DETECTED   Enterobacter cloacae complex NOT DETECTED NOT DETECTED   Escherichia coli NOT DETECTED NOT DETECTED   Klebsiella aerogenes NOT DETECTED NOT DETECTED   Klebsiella oxytoca NOT DETECTED NOT DETECTED   Klebsiella pneumoniae NOT DETECTED NOT DETECTED   Proteus species NOT DETECTED NOT DETECTED   Salmonella species NOT DETECTED NOT DETECTED   Serratia marcescens NOT DETECTED NOT DETECTED   Haemophilus influenzae NOT DETECTED NOT DETECTED   Neisseria meningitidis NOT DETECTED NOT DETECTED   Pseudomonas aeruginosa NOT DETECTED NOT  DETECTED   Stenotrophomonas maltophilia NOT DETECTED NOT DETECTED   Candida albicans NOT DETECTED NOT DETECTED   Candida auris NOT DETECTED NOT DETECTED   Candida glabrata NOT DETECTED NOT DETECTED   Candida krusei NOT DETECTED NOT DETECTED   Candida parapsilosis NOT DETECTED NOT DETECTED   Candida tropicalis NOT DETECTED NOT DETECTED   Cryptococcus neoformans/gattii NOT DETECTED NOT DETECTED    [name] 03/28/2023  4:07 PM

## 2023-03-28 NOTE — Progress Notes (Signed)
Security was called distressed button was paged.

## 2023-03-28 NOTE — Assessment & Plan Note (Signed)
Severe, resistant, uncontrolled. Hopefully, will improve with dialysis today. Continue home blood pressure regimen. Defer to nephrology for blood pressure control given ESRD. -amlodipine 10 mg, carvedilol 25 mg twice daily, diltiazem 300 mg daily, hydralazine 100 mg 3 times daily, losartan 100 mg daily  -Nephrology consulted, recs appreciated

## 2023-03-28 NOTE — Assessment & Plan Note (Signed)
Afebrile, since admission. Suspect repeat bacteremia due to dialysis site infection. Consider endocarditis given new pericardial effusion.  -VVS consulted for right AV fistula, appreciate recs -Continue empiric Cefepime, Flagyl, Vancomycin -Tylenol prn -Follow-up Bcx, Wcx

## 2023-03-28 NOTE — Plan of Care (Signed)

## 2023-03-28 NOTE — Consult Note (Signed)
Hospital Consult    Reason for Consult: Concern for right arm access infection Referring Physician: Dr. Rush Landmark MRN #:  [id number]  History of Present Illness: This is a 37 y.o. male history of right upper extremity graft ultimately elected for treatment at [facility] appears that currently he is dialyzing via [facility].  On my exam patient was unable to answer any questions.  Past Medical History:  Diagnosis Date   Bipolar 1 disorder (HCC)    Depression    ESRD on hemodialysis (HCC)    HD at [facility] on TTS schedule   GERD (gastroesophageal reflux disease)    Hearing difficulty of left ear    75% hearing   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   Hypertension    Low blood sugar     Past Surgical History:  Procedure Laterality Date   A/V FISTULAGRAM Right 10/27/2022   Procedure: A/V Fistulagram;  Surgeon: Chuck Hint, MD;  Location: [facility] INVASIVE CV LAB;  Service: Cardiovascular;  Laterality: Right;   APPENDECTOMY     AV FISTULA PLACEMENT Left 03/03/2019   Procedure: ARTERIOVENOUS (AV) FISTULA CREATION LEFT ARM;  Surgeon: Maeola Harman, MD;  Location: [facility] OR;  Service: Vascular;  Laterality: Left;   BASCILIC VEIN TRANSPOSITION Right 04/12/2019   Procedure: RIGHT UPPER EXTREMITY BASCILIC VEIN TRANSPOSITION FIRST STAGE FISTULA;  Surgeon: Chuck Hint, MD;  Location: [facility] OR;  Service: Vascular;  Laterality: Right;   BIOPSY  10/14/2021   Procedure: BIOPSY;  Surgeon: Sherrilyn Rist, MD;  Location: [facility] ENDOSCOPY;  Service: Gastroenterology;;   BIOPSY  06/06/2022   Procedure: BIOPSY;  Surgeon: Napoleon Form, MD;  Location: [facility] ENDOSCOPY;  Service: Gastroenterology;;   BIOPSY  11/12/2022   Procedure: BIOPSY;  Surgeon: Lemar Lofty., MD;  Location: [facility] ENDOSCOPY;  Service: Gastroenterology;;   BIOPSY  12/05/2022   Procedure: BIOPSY;  Surgeon: [name], MD;  Location: [facility] ENDOSCOPY;  Service: Gastroenterology;;   COLONOSCOPY  WITH PROPOFOL N/A 10/14/2021   Procedure: COLONOSCOPY WITH PROPOFOL;  Surgeon: Sherrilyn Rist, MD;  Location: [facility] ENDOSCOPY;  Service: Gastroenterology;  Laterality: N/A;   ESOPHAGOGASTRODUODENOSCOPY N/A 11/12/2022   Procedure: ESOPHAGOGASTRODUODENOSCOPY (EGD);  Surgeon: Lemar Lofty., MD;  Location: [facility] ENDOSCOPY;  Service: Gastroenterology;  Laterality: N/A;   ESOPHAGOGASTRODUODENOSCOPY N/A 12/05/2022   Procedure: ESOPHAGOGASTRODUODENOSCOPY (EGD);  Surgeon: [name], MD;  Location: [facility] ENDOSCOPY;  Service: Gastroenterology;  Laterality: N/A;   ESOPHAGOGASTRODUODENOSCOPY (EGD) WITH PROPOFOL N/A 06/06/2022   Procedure: ESOPHAGOGASTRODUODENOSCOPY (EGD) WITH PROPOFOL;  Surgeon: Napoleon Form, MD;  Location: [facility] ENDOSCOPY;  Service: Gastroenterology;  Laterality: N/A;   FISTULA SUPERFICIALIZATION Right 11/07/2022   Procedure: PLICATION OF LARGE ANEURYSMS OF RIGHT ARTERIOVENOUS FISTULA WITH INSERTION OF 7mm INTERPOSITIONAL GORTEX GRAFT;  Surgeon: Chuck Hint, MD;  Location: [facility] OR;  Service: Vascular;  Laterality: Right;   FLEXIBLE SIGMOIDOSCOPY N/A 04/14/2022   Procedure: FLEXIBLE SIGMOIDOSCOPY;  Surgeon: Napoleon Form, MD;  Location: [facility] ENDOSCOPY;  Service: Gastroenterology;  Laterality: N/A;   INSERTION OF DIALYSIS CATHETER N/A 11/07/2022   Procedure: INSERTION OF RIGHT INTERNAL JUGULAR TUNNELED DIALYSIS CATHETER;  Surgeon: Chuck Hint, MD;  Location: [facility] OR;  Service: Vascular;  Laterality: N/A;   LIGATION OF ARTERIOVENOUS  FISTULA Left 03/06/2019   Procedure: LIGATION OF ARTERIOVENOUS  FISTULA;  Surgeon: Cephus Shelling, MD;  Location: [facility] OR;  Service: Vascular;  Laterality: Left;   PERIPHERAL VASCULAR BALLOON ANGIOPLASTY  10/27/2022  Procedure: PERIPHERAL VASCULAR BALLOON ANGIOPLASTY;  Surgeon: Chuck Hint, MD;  Location: [facility] INVASIVE CV LAB;  Service: Cardiovascular;;  rt upper arm fistula   SAVORY DILATION N/A 11/12/2022   Procedure:  SAVORY DILATION;  Surgeon: Meridee Score Netty Starring., MD;  Location: [facility] ENDOSCOPY;  Service: Gastroenterology;  Laterality: N/A;   spinal tap     SPINE SURGERY     related to a spinal infection, unsure of surgery or infection source   WISDOM TOOTH EXTRACTION      Allergies  Allergen Reactions   Zestril [Lisinopril] Swelling   Nsaids Other (See Comments)    "Kidney problems "   Risperdal [Risperidone] Other (See Comments)    Unknown reaction     Prior to Admission medications   Medication Sig Start Date End Date Taking? Authorizing Provider  amLODipine (NORVASC) 10 MG tablet TAKE 1 TABLET BY MOUTH EVERY DAY 04/10/22  Yes Sabino Dick, DO  calcium acetate (PHOSLO) 667 MG capsule Take 1 capsule (667 mg total) by mouth 3 (three) times daily with meals. 01/04/22  Yes Vonna Drafts, MD  carvedilol (COREG) 25 MG tablet 25 mg 2 (two) times daily with a meal. 01/09/23  Yes [provider]  diltiazem (TIAZAC) 300 MG 24 hr capsule Take 1 capsule by mouth daily. 01/10/23 04/10/23 Yes [provider]  divalproex (DEPAKOTE ER) 500 MG 24 hr tablet Take 500 mg by mouth daily.   Yes [provider]  hydrALAZINE (APRESOLINE) 100 MG tablet Take 1 tablet (100 mg total) by mouth 3 (three) times daily. 01/28/23  Yes [name], DO  losartan (COZAAR) 100 MG tablet Take 1 tablet (100 mg total) by mouth daily. 11/15/22  Yes Baglia, Corrina, PA-C  pantoprazole (PROTONIX) 40 MG tablet Take 1 tablet (40 mg total) by mouth 2 (two) times daily. 01/28/23  Yes [name], DO    Social History   Socioeconomic History   Marital status: Married    Spouse name: Not on file   Number of children: 3   Years of education: Not on file   Highest education level: Not on file  Occupational History   Occupation: HVAC  Tobacco Use   Smoking status: Every Day    Current packs/day: 0.00    Types: Cigarettes    Last attempt to quit: 02/27/2016    Years since quitting: 7.0   Smokeless tobacco:  Never   Tobacco comments:    Returned to smoking 2 black and milds per day  Vaping Use   Vaping status: Never Used  Substance and Sexual Activity   Alcohol use: Not Currently    Comment: rare   Drug use: Yes    Types: Marijuana   Sexual activity: Not Currently  Other Topics Concern   Not on file  Social History Narrative   Not on file   Social Determinants of Health   Financial Resource Strain: Not on file  Food Insecurity: No Food Insecurity (01/14/2023)   Hunger Vital Sign    Worried About Running Out of Food in the Last Year: Never true    Ran Out of Food in the Last Year: Never true  Transportation Needs: No Transportation Needs (01/14/2023)   PRAPARE - Administrator, Civil Service (Medical): No    Lack of Transportation (Non-Medical): No  Physical Activity: Inactive (03/24/2023)   Exercise Vital Sign    Days of Exercise per Week: 0 days    Minutes of Exercise per Session: 0 min  Stress: Stress Concern  Present (03/24/2023)   [employer] of Occupational Health - Occupational Stress Questionnaire    Feeling of Stress : Rather much  Social Connections: Unknown (09/20/2021)   Received from [facility], [facility]   Social Network    Social Network: Not on file  Intimate Partner Violence: Patient Declined (12/03/2022)   Humiliation, Afraid, Rape, and Kick questionnaire    Fear of Current or Ex-Partner: Patient declined    Emotionally Abused: Patient declined    Physically Abused: Patient declined    Sexually Abused: Patient declined     Family History  Problem Relation Age of Onset   Hypertension Mother    Kidney failure Mother    Diabetes Mother    Hearing loss Father    Hypertension Sister    Heart disease Maternal Grandmother    Stroke Maternal Grandfather    Asthma Son     ROS: Unable to be obtained as patient nonresponsive at time of exam  Physical Examination  Vitals:   03/28/23 1030 03/28/23 1131  BP: (!) 158/91   Pulse: 89    Resp: 20   Temp:  98.1 F (36.7 C)  SpO2: 99%    There is no height or weight on file to calculate BMI.  Unresponsive but arousable Nonlabored respirations There were no thrills in his upper extremities Right arm pictured below  CBC    Component Value Date/Time   WBC 6.2 03/28/2023 0415   RBC 3.29 (L) 03/28/2023 0415   HGB 9.5 (L) 03/28/2023 0415   HGB 10.4 (L) 03/17/2022 1721   HCT 30.3 (L) 03/28/2023 0415   HCT 30.2 (L) 03/17/2022 1721   PLT 111 (L) 03/28/2023 0415   PLT 207 03/17/2022 1721   MCV 92.1 03/28/2023 0415   MCV 87 03/17/2022 1721   MCH 28.9 03/28/2023 0415   MCHC 31.4 03/28/2023 0415   RDW 17.5 (H) 03/28/2023 0415   RDW 14.0 03/17/2022 1721   LYMPHSABS 0.4 (L) 03/27/2023 1828   MONOABS 0.6 03/27/2023 1828   EOSABS 0.0 03/27/2023 1828   BASOSABS 0.0 03/27/2023 1828    BMET    Component Value Date/Time   NA 140 03/28/2023 0415   NA 139 06/16/2022 1606   K 4.7 03/28/2023 0415   CL 99 03/28/2023 0415   CO2 24 03/28/2023 0415   GLUCOSE 97 03/28/2023 0415   BUN 71 (H) 03/28/2023 0415   BUN 72 (H) 06/16/2022 1606   CREATININE 15.59 (H) 03/28/2023 0415   CALCIUM 9.2 03/28/2023 0415   GFRNONAA 4 (L) 03/28/2023 0415   GFRAA 6 (L) 10/26/2019 1032    COAGS: Lab Results  Component Value Date   INR 1.2 03/27/2023     Non-Invasive Vascular Imaging:   No new studies   ASSESSMENT/PLAN: This is a 37 y.o. male history of multiple dialysis access procedures most recently at [facility] currently dialyzing via catheter.  On my exam this morning I do not appreciate any fluctuance or drainage from the healing access site.  He does have underlying graft but again no evidence of infection send suggest graft excision is warranted.  Vascular surgery will be available as needed.  Abeeha Twist C. Randie Heinz, MD [facility] Office: [phone] Pager: [phone]

## 2023-03-28 NOTE — Assessment & Plan Note (Signed)
Reported blood in stool by family week.  Patient reports diarrhea over the last week but history is limited.  Hemoglobin 10.9 in the ED, this is actually higher than his baseline with his history of anemia. - Will continue to monitor for symptoms - AM CBC

## 2023-03-29 ENCOUNTER — Encounter (HOSPITAL_COMMUNITY): Payer: Self-pay | Admitting: Family Medicine

## 2023-03-29 DIAGNOSIS — A419 Sepsis, unspecified organism: Secondary | ICD-10-CM | POA: Diagnosis not present

## 2023-03-29 DIAGNOSIS — N186 End stage renal disease: Secondary | ICD-10-CM | POA: Diagnosis not present

## 2023-03-29 DIAGNOSIS — I3139 Other pericardial effusion (noninflammatory): Secondary | ICD-10-CM | POA: Diagnosis not present

## 2023-03-29 DIAGNOSIS — I10 Essential (primary) hypertension: Secondary | ICD-10-CM | POA: Diagnosis not present

## 2023-03-29 LAB — CBC
HCT: 31.2 % — ABNORMAL LOW (ref 39.0–52.0)
Hemoglobin: 10.1 g/dL — ABNORMAL LOW (ref 13.0–17.0)
MCH: 28.8 pg (ref 26.0–34.0)
MCHC: 32.4 g/dL (ref 30.0–36.0)
MCV: 88.9 fL (ref 80.0–100.0)
Platelets: 125 10*3/uL — ABNORMAL LOW (ref 150–400)
RBC: 3.51 MIL/uL — ABNORMAL LOW (ref 4.22–5.81)
RDW: 17 % — ABNORMAL HIGH (ref 11.5–15.5)
WBC: 3.7 10*3/uL — ABNORMAL LOW (ref 4.0–10.5)
nRBC: 0 % (ref 0.0–0.2)

## 2023-03-29 LAB — BASIC METABOLIC PANEL
Anion gap: 15 (ref 5–15)
BUN: 37 mg/dL — ABNORMAL HIGH (ref 6–20)
CO2: 25 mmol/L (ref 22–32)
Calcium: 9.4 mg/dL (ref 8.9–10.3)
Chloride: 97 mmol/L — ABNORMAL LOW (ref 98–111)
Creatinine, Ser: 9.47 mg/dL — ABNORMAL HIGH (ref 0.61–1.24)
GFR, Estimated: 7 mL/min — ABNORMAL LOW (ref >60)
Glucose, Bld: 109 mg/dL — ABNORMAL HIGH (ref 70–99)
Potassium: 3.9 mmol/L (ref 3.5–5.1)
Sodium: 137 mmol/L (ref 135–145)

## 2023-03-29 MED ORDER — DIVALPROEX SODIUM ER 500 MG PO TB24
500.0000 mg | ORAL_TABLET | Freq: Every day | ORAL | Status: DC
  Administered 2023-03-29 – 2023-04-03 (×6): 500 mg via ORAL
  Filled 2023-03-29 (×8): qty 1

## 2023-03-29 MED ORDER — TIZANIDINE HCL 4 MG PO TABS
2.0000 mg | ORAL_TABLET | Freq: Every day | ORAL | Status: DC
  Administered 2023-03-29 – 2023-03-31 (×3): 2 mg via ORAL
  Filled 2023-03-29 (×3): qty 1

## 2023-03-29 MED ORDER — AMOXICILLIN-POT CLAVULANATE 500-125 MG PO TABS
1.0000 | ORAL_TABLET | Freq: Two times a day (BID) | ORAL | Status: DC
  Administered 2023-03-29 – 2023-03-30 (×3): 1 via ORAL
  Filled 2023-03-29 (×3): qty 1

## 2023-03-29 NOTE — Progress Notes (Signed)
Brief cardiology note: Patient had 5L removed with dialysis yesterday. Remains hypertensive, not consistent with tamponade. Limited echo to be done later today or tomorrow based on availability. We will sign off at this time, please contact us with questions.  Jodelle Red, MD, PhD, [facility]  [facility]  [place]  [facility] at [facility] [address], [address] [place], [place] [address] [phone]

## 2023-03-29 NOTE — Assessment & Plan Note (Addendum)
Severe, resistant, uncontrolled. Continue home blood pressure regimen.  Defer to nephrology for blood pressure control given ESRD. -amlodipine 10 mg, carvedilol 25 mg twice daily, diltiazem 300 mg daily, hydralazine 100 mg 3 times daily, losartan 100 mg daily

## 2023-03-29 NOTE — Assessment & Plan Note (Signed)
Nephrology on board.  TTS schedule.  HD yesterday although cut short to 2.5 hrs due to disruptive behavior.

## 2023-03-29 NOTE — Progress Notes (Cosign Needed Addendum)
Subjective: No current complaints, said tolerated dialysis last night, noted issue with staffing and patient, briefly discussed with him proper behavior.  No fever chills on hemodialysis yest.   Objective Vital signs in last 24 hours: Vitals:   03/29/23 0400 03/29/23 0444 03/29/23 0600 03/29/23 0842  BP:  (!) 162/100  (!) 186/120  Pulse: 89 88 89 92  Resp: 14 17 16 16   Temp:  97.9 F (36.6 C)  98.6 F (37 C)  TempSrc:  Oral  Axillary  SpO2: 100% 93% 94% 100%  Weight:      Height:       Weight change:   Physical Exam: Edward: Alert pleasant hard of hearing Heart: RRR no MRG Lungs: CTA bilaterally nonlabored breathing Abdomen: NABS, soft NTND Extremities: Trace bipedal edema, right upper arm dressing dry clear Dialysis Access: R internal jugular TDC dressing dry clear.   OP Dialysis Orders: Center: [facility] 4 hours, EDW 75, [place] 2K, 2 calcium, no heparin, calcitriol 1.25 mics p.o. q. HD, Mircera 150 mcg every 2 weeks last given 03/17/2023, was on Venofer 100 mg loading dose with transferrin sat 13% (hold iron this admit with fevers)   Problem/Plan: Fever/sepsis= BC showing gram-positive rod(questionable contaminant) afebrile no leukocytosis no tachycardia vascular surgery does not think right arm site of prior access is infected echo no sign of vegetation spite worsening moderate pericardial effusion empiric cefepime Flagyl vancomycin per admit team .  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, ESA due 11/19, holding  Venofer/infection/ Metabolic bone disease -corrected calcium over 10 hold vitamin D on dialysis, phosphorus 10.0 uses PhosLo as binder    Lenny Pastel, PA-C [facility] Beeper [phone] 03/29/2023,12:27 PM  LOS: 2 days   Labs: Basic Metabolic Panel: Recent Labs  Lab 03/28/23 0415 03/28/23 1459 03/29/23 0213  NA 140 138 137  K 4.7 5.0 3.9  CL 99 95* 97*  CO2 24 21* 25  GLUCOSE 97 86 109*  BUN 71* 78* 37*  CREATININE 15.59* 16.87* 9.47*  CALCIUM 9.2 9.6 9.4  PHOS 10.0* 10.7*  --    Liver Function Tests: Recent Labs  Lab 03/27/23 1828 03/28/23 0415 03/28/23 1459  AST 25  --   --   ALT 16  --   --   ALKPHOS 69  --   --   BILITOT 0.7  --   --   PROT 6.6  --   --   ALBUMIN 3.2* 2.9* 3.4*   No results for input(s): "LIPASE", "AMYLASE" in the last 168 hours. No results for input(s): "AMMONIA" in the last 168 hours. CBC: Recent Labs  Lab 03/27/23 1828 03/27/23 1838 03/28/23 0415 03/28/23 1459 03/29/23 0213  WBC 4.9  --  6.2 4.8 3.7*  NEUTROABS 3.9  --   --   --   --   HGB 10.0*   < > 9.5* 10.8* 10.1*  HCT 30.6*   < > 30.3* 33.8* 31.2*  MCV 90.8  --  92.1 90.1 88.9  PLT 133*  --  111* 126* 125*   < > =  values in this interval not displayed.   Cardiac Enzymes: No results for input(s): "CKTOTAL", "CKMB", "CKMBINDEX", "TROPONINI" in the last 168 hours. CBG: Recent Labs  Lab 03/27/23 1620 03/28/23 1606  GLUCAP 86 108*    Studies/Results: CT HEAD WO CONTRAST ( )  Result Date: 03/27/2023 CLINICAL DATA:  Headache, fever EXAM: CT HEAD WITHOUT CONTRAST TECHNIQUE: Contiguous axial images were obtained from the base of the skull through the vertex without intravenous contrast. RADIATION DOSE REDUCTION: This exam was performed according to the departmental dose-optimization program which includes automated exposure control, adjustment of the mA and/or kV according to patient size and/or use of iterative reconstruction technique. COMPARISON:   01/06/2023 FINDINGS: Brain: No acute intracranial abnormality. Specifically, no hemorrhage, hydrocephalus, mass lesion, acute infarction, or significant intracranial injury. Vascular: No hyperdense vessel or unexpected calcification. Skull: No acute calvarial abnormality. Sinuses/Orbits: No acute findings Other: None IMPRESSION: No acute intracranial abnormality. Electronically Signed   By: Charlett Nose M.D.   On: 03/27/2023 22:33   ECHOCARDIOGRAM COMPLETE  Result Date: 03/27/2023    ECHOCARDIOGRAM REPORT   Patient Name:   [name] [facility] Date of Exam: 03/27/2023 Medical Rec #:  [id number]       Height:       69.0 in Accession #:    [id number]      Weight:       174.2 lb Date of Birth:  [birth year]       BSA:          1.948 m Patient Age:    37 years        BP:           183/126 mmHg Patient Gender: M               HR:           110 bpm. Exam Location:  Inpatient Procedure: 2D Echo, Color Doppler and Cardiac Doppler STAT ECHO Indications:    murmur.  History:        Patient has prior history of Echocardiogram examinations, most                 recent 06/03/2022. End stage renal disease, Signs/Symptoms:Chest                 Pain and Shortness of Breath; Risk Factors:Current Smoker.  Sonographer:    Delcie Roch RDCS Referring Phys: [id number] CHRISTOPHER J TEGELER IMPRESSIONS  1. Left ventricular ejection fraction, by estimation, is approximately 55%. The left ventricle has normal function. The left ventricle has no regional wall motion abnormalities. There is moderate concentric left ventricular hypertrophy.  2. Right ventricular systolic function is normal. The right ventricular size is normal. Mildly increased right ventricular wall thickness. Tricuspid regurgitation signal is inadequate for assessing PA pressure.  3. Turbulent and prominent flow noted in LVOT and RVOT with possible dilatation of PA. No obvious left ot right shunt observed. Most likely related to high flow state based on available images.  4.  Moderate pericardial effusion, increased compared to study from January. The pericardial effusion is circumferential. Majority of collection is posterolateral to left ventricle and at right atrium. There is approximately 20% change in mitral inflow and 60% change in tricuspid inflow with respiration suggesting hemodynamic significance. Also early diastolic compression (1/3) of right atrium, but no right ventricular compression. Although not diagnostic of tamponade, would follow clinically and consider reimaging within 24-48 hours.  5. The mitral valve is grossly normal. Trivial mitral valve regurgitation.  6. The  aortic valve is tricuspid. Aortic valve regurgitation is not visualized.  7. The inferior vena cava is normal in size with greater than 50% respiratory variability, suggesting right atrial pressure of 3 mmHg. FINDINGS  Left Ventricle: Left ventricular ejection fraction, by estimation, is 55%. The left ventricle has normal function. The left ventricle has no regional wall motion abnormalities. Global longitudinal strain performed but not reported based on interpreter judgement due to suboptimal tracking. The left ventricular internal cavity size was normal in size. There is moderate concentric left ventricular hypertrophy. Left ventricular diastolic parameters are indeterminate. Right Ventricle: The right ventricular size is normal. Mildly increased right ventricular wall thickness. Right ventricular systolic function is normal. Tricuspid regurgitation signal is inadequate for assessing PA pressure. Left Atrium: Left atrial size was normal in size. Right Atrium: Right atrial size was normal in size. Pericardium: A moderately sized pericardial effusion is present. The pericardial effusion is circumferential. Mitral Valve: The mitral valve is grossly normal. Trivial mitral valve regurgitation. Tricuspid Valve: The tricuspid valve is grossly normal. Tricuspid valve regurgitation is mild. Aortic Valve: The  aortic valve is tricuspid. Aortic valve regurgitation is not visualized. Pulmonic Valve: The pulmonic valve was grossly normal. Pulmonic valve regurgitation is trivial. Aorta: The aortic root and ascending aorta are structurally normal, with no evidence of dilitation. Venous: The inferior vena cava is normal in size with greater than 50% respiratory variability, suggesting right atrial pressure of 3 mmHg. IAS/Shunts: No atrial level shunt detected by color flow Doppler.  LEFT VENTRICLE PLAX 2D LVIDd:         5.20 cm      Diastology LVIDs:         4.00 cm      LV e' medial:  8.27 cm/s LV PW:         1.60 cm      LV e' lateral: 11.70 cm/s LV IVS:        1.10 cm LVOT diam:     2.00 cm LV SV:         76 LV SV Index:   39 LVOT Area:     3.14 cm  LV Volumes (MOD) LV vol d, MOD A4C: 167.0 ml LV vol s, MOD A4C: 75.8 ml LV SV MOD A4C:     167.0 ml RIGHT VENTRICLE             IVC RV Basal diam:  3.20 cm     IVC diam: 2.50 cm RV S prime:     15.60 cm/s TAPSE (M-mode): 2.0 cm LEFT ATRIUM           Index        RIGHT ATRIUM           Index LA Vol (A4C): 65.9 ml 33.82 ml/m  RA Area:     15.60 cm                                    RA Volume:   38.80 ml  19.91 ml/m  AORTIC VALVE LVOT Vmax:   162.00 cm/s LVOT Vmean:  105.000 cm/s LVOT VTI:    0.243 m  AORTA Ao Root diam: 3.10 cm Ao Asc diam:  3.40 cm  SHUNTS Systemic VTI:  0.24 m Systemic Diam: 2.00 cm Nona Dell MD Electronically signed by Nona Dell MD Signature Date/Time: 03/27/2023/7:28:07 PM    Final    DG Chest Port 1 [address]  Result Date: 03/27/2023 CLINICAL DATA:  Questionable sepsis - evaluate for abnormality EXAM: PORTABLE CHEST 1 VIEW COMPARISON:  January 05, 2023 FINDINGS: The cardiomediastinal silhouette is unchanged and enlarged in contour.RIGHT chest CVC with tip terminating over the RIGHT atrium. Perihilar vascular prominence, similar in comparison to priors. No pleural effusion. No pneumothorax. No acute pleuroparenchymal abnormality. IMPRESSION: No  acute cardiopulmonary abnormality. Perihilar vascular congestion. Electronically Signed   By: Meda Klinefelter M.D.   On: 03/27/2023 17:07   Medications:  ceFEPime (MAXIPIME) IV Stopped (03/28/23 1735)   metronidazole 500 mg (03/29/23 1021)   vancomycin Stopped (03/28/23 1806)    amLODipine  10 mg Oral Daily   carvedilol  25 mg Oral BID WC   Chlorhexidine Gluconate Cloth  6 each Topical Q0600   [START ON 03/31/2023] darbepoetin (ARANESP) injection - NON-DIALYSIS  150 mcg Subcutaneous Q Tue-1800   diltiazem  300 mg Oral Daily   divalproex  500 mg Oral QHS   heparin  5,000 Units Subcutaneous Q8H   hydrALAZINE  100 mg Oral Q8H   losartan  100 mg Oral Daily   pantoprazole  40 mg Oral Daily    Note for cosign per Dr Glenna Fellows

## 2023-03-29 NOTE — Progress Notes (Signed)
Daily Progress Note Intern Pager: [phone]  Patient name: [name] Medical record number: [id number] Date of birth: October 20, 1985 Age: 37 y.o. Gender: male  Primary Care Provider: [name], DO Consultants: Nephrology, cards Code Status: Full  Pt Overview and Major Events to Date:  11/15-admitted  Assessment and Plan: [name] is a 37 y.o. male admitted for sepsis of unknown etiology. Patient additionally found to have worsened moderate pericardial effusion, currently undergoing evaluation. 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  Assessment & Plan Sepsis (HCC) Resolved.  Patient has remained afebrile since admission on broad-spectrum ABX.  No leukocytosis , no longer tachycardic.  Pro-Cal > 7, blood cx showing gm + rods, no species detected, may be contaminate. Initially there was concern for AV fistula graft infection however, patient has been evaluated by vascular surgery who does not note any signs of infection or reason to remove graft. Considered endocarditis, echo with no signs of vegetation but does show worsening moderate pericardial effusion.  Appears very well today. -Continue empiric Cefepime, Flagyl, Vancomycin for now -plan to transition to oral today or tomorrow (consider Augmentin) -Tylenol prn  Moderate Pericardial effusion  HFmrEF  Chest pain Suspect worsening pericardial effusion is secondary to volume overload after missing 2 sessions HD. ACS work-up for chest pain inconclusive with stable troponin's (ESRD), likely increased oxygen demand due to effusion and severely elevated blood pressures. Of note EKG with new T-wave inversions on admission, cardiology on board.  Previously noted systolic murmur not heard today.  -Cardiology consulted, recs appreciated, signed off at this time -Repeat ECHO today to ensure effusion is not worsening -Continue HD to get fluid off, repeat echo outpatient to monitor Volume  overload  ESRD (end stage renal disease) [facility]) Nephrology on board.  TTS schedule.  HD yesterday although cut short to 2.5 hrs due to disruptive behavior. Hypertension Severe, resistant, uncontrolled. Continue home blood pressure regimen.  Defer to nephrology for blood pressure control given ESRD. -amlodipine 10 mg, carvedilol 25 mg twice daily, diltiazem 300 mg daily, hydralazine 100 mg 3 times daily, losartan 100 mg daily   Blood in stool Hgb stable.  No blood in stool during hospitalization thus far -Monitor  -Outpatient follow-up with GI   Chronic and Stable Issues: Alport syndrome - Bilateral sensorineural hearing loss Anemia - stable, Hgb 10.9, baseline in 7s Secondary hyperparathyroidism Bipolar 1 Disorder - no meds GERD - holding home protonix for now Migraines - adding back home Depakote 500 mg at bedtime  FEN/GI: Renal PPx: Heparin - will clarify w/ nephro that this is okay  Dispo: Pending continued medical management  Subjective:  Patient states he is feeling much better today and wants to go home.  Still has intermittent occipital headache consistent with prior headaches. He was previously taking Depakote which helped control headaches but there was apparently confusion with prior hospitalizations and he is no longer taking it.  He would like to start it again.   Objective: Temp:  [97.8 F (36.6 C)-98.8 F (37.1 C)] 97.9 F (36.6 C) (11/17 0444) Pulse Rate:  [79-99] 89 (11/17 0600) Resp:  [10-27] 16 (11/17 0600) BP: (144-200)/(10-132) 162/100 (11/17 0444) SpO2:  [85 %-100 %] 94 % (11/17 0600) Weight:  [81 kg-86.1 kg] (P) 81 kg (11/17 0136) Physical Exam: General: Alert, well-appearing, standing up, pleasant Cardiovascular: RRR, normal S1/S2, no murmur Respiratory: CTAB, normal effort Neuro: Alert, oriented, no focal deficits  Laboratory: Most recent CBC Lab Results  Component  Value Date   WBC 3.7 (L) 03/29/2023   HGB 10.1 (L) 03/29/2023   HCT 31.2 (L)  03/29/2023   MCV 88.9 03/29/2023   PLT 125 (L) 03/29/2023   Most recent BMP    Latest Ref Rng & Units 03/29/2023    2:13 AM  BMP  Glucose 70 - 99 mg/dL 366   BUN 6 - 20 mg/dL 37   Creatinine 4.40 - 1.24 mg/dL 3.47   Sodium 425 - 956 mmol/L 137   Potassium 3.5 - 5.1 mmol/L 3.9   Chloride 98 - 111 mmol/L 97   CO2 22 - 32 mmol/L 25   Calcium 8.9 - 10.3 mg/dL 9.4      Edward Alley, DO 03/29/2023, 7:33 AM  PGY-3, [facility] FPTS Intern pager: [phone], text pages welcome Secure chat group [facility] Teaching Service

## 2023-03-29 NOTE — Assessment & Plan Note (Addendum)
Hgb stable.  No blood in stool during hospitalization thus far -Monitor  -Outpatient follow-up with GI

## 2023-03-29 NOTE — Assessment & Plan Note (Addendum)
Suspect worsening pericardial effusion is secondary to volume overload after missing 2 sessions HD. ACS work-up for chest pain inconclusive with stable troponin's (ESRD), likely increased oxygen demand due to effusion and severely elevated blood pressures. Of note EKG with new T-wave inversions on admission, cardiology on board.  Previously noted systolic murmur not heard today.  -Cardiology consulted, recs appreciated, signed off at this time -Repeat ECHO today to ensure effusion is not worsening -Continue HD to get fluid off, repeat echo outpatient to monitor

## 2023-03-29 NOTE — Assessment & Plan Note (Addendum)
Resolved.  Patient has remained afebrile since admission on broad-spectrum ABX.  No leukocytosis , no longer tachycardic.  Pro-Cal > 7, blood cx showing gm + rods, no species detected, may be contaminate. Initially there was concern for AV fistula graft infection however, patient has been evaluated by vascular surgery who does not note any signs of infection or reason to remove graft. Considered endocarditis, echo with no signs of vegetation but does show worsening moderate pericardial effusion.  Appears very well today. -Continue empiric Cefepime, Flagyl, Vancomycin for now -plan to transition to oral today or tomorrow (consider Augmentin) -Tylenol prn

## 2023-03-29 NOTE — Progress Notes (Signed)
Subjective: No current complaints, said tolerated dialysis last night, noted issue with staffing and patient, briefly discussed with him proper behavior.  No fever chills on hemodialysis yest.   Objective Vital signs in last 24 hours: Vitals:   03/29/23 0400 03/29/23 0444 03/29/23 0600 03/29/23 0842  BP:  (!) 162/100  (!) 186/120  Pulse: 89 88 89 92  Resp: 14 17 16 16   Temp:  97.9 F (36.6 C)  98.6 F (37 C)  TempSrc:  Oral  Axillary  SpO2: 100% 93% 94% 100%  Weight:      Height:       Weight change:   Physical Exam: General: Alert pleasant hard of hearing Heart: RRR no MRG Lungs: CTA bilaterally nonlabored breathing Abdomen: NABS, soft NTND Extremities: Trace bipedal edema, right upper arm dressing dry clear Dialysis Access: R internal jugular TDC dressing dry clear.   OP Dialysis Orders: Center: NW 4 hours, EDW 75, Bath 2K, 2 calcium, no heparin, calcitriol 1.25 mics p.o. q. HD, Mircera 150 mcg every 2 weeks last given 03/17/2023, was on Venofer 100 mg loading dose with transferrin sat 13% (hold iron this admit with fevers)   Problem/Plan: Fever/sepsis= BC showing gram-positive rod(questionable contaminant) afebrile no leukocytosis no tachycardia vascular surgery does not think right arm site of prior access is infected echo no sign of vegetation spite worsening moderate pericardial effusion empiric cefepime Flagyl vancomycin per admit team .  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, ESA due 11/19, holding  Venofer/infection/ Metabolic bone disease -corrected calcium over 10 hold vitamin D on dialysis, phosphorus 10.0 uses PhosLo as binder  Lenny Pastel, PA-C [facility] Beeper [phone] 03/29/2023,2:54 PM  LOS: 2 days   Labs: Basic Metabolic Panel: Recent Labs  Lab 03/28/23 0415 03/28/23 1459 03/29/23 0213  NA 140 138 137  K 4.7 5.0 3.9  CL 99 95* 97*  CO2 24 21* 25  GLUCOSE 97 86 109*  BUN 71* 78* 37*  CREATININE 15.59* 16.87* 9.47*  CALCIUM 9.2 9.6 9.4  PHOS 10.0* 10.7*  --    Liver Function Tests: Recent Labs  Lab 03/27/23 1828 03/28/23 0415 03/28/23 1459  AST 25  --   --   ALT 16  --   --   ALKPHOS 69  --   --   BILITOT 0.7  --   --   PROT 6.6  --   --   ALBUMIN 3.2* 2.9* 3.4*   No results for input(s): "LIPASE", "AMYLASE" in the last 168 hours. No results for input(s): "AMMONIA" in the last 168 hours. CBC: Recent Labs  Lab 03/27/23 1828 03/27/23 1838 03/28/23 0415 03/28/23 1459 03/29/23 0213  WBC 4.9  --  6.2 4.8 3.7*  NEUTROABS 3.9  --   --   --   --   HGB 10.0*   < > 9.5* 10.8* 10.1*  HCT 30.6*   < > 30.3* 33.8* 31.2*  MCV 90.8  --  92.1 90.1 88.9  PLT 133*  --  111* 126* 125*   < > = values  in this interval not displayed.   Cardiac Enzymes: No results for input(s): "CKTOTAL", "CKMB", "CKMBINDEX", "TROPONINI" in the last 168 hours. CBG: Recent Labs  Lab 03/27/23 1620 03/28/23 1606  GLUCAP 86 108*    Studies/Results: CT HEAD WO CONTRAST ( )  Result Date: 03/27/2023 CLINICAL DATA:  Headache, fever EXAM: CT HEAD WITHOUT CONTRAST TECHNIQUE: Contiguous axial images were obtained from the base of the skull through the vertex without intravenous contrast. RADIATION DOSE REDUCTION: This exam was performed according to the departmental dose-optimization program which includes automated exposure control, adjustment of the mA and/or kV according to patient size and/or use of iterative reconstruction technique. COMPARISON:  01/06/2023  FINDINGS: Brain: No acute intracranial abnormality. Specifically, no hemorrhage, hydrocephalus, mass lesion, acute infarction, or significant intracranial injury. Vascular: No hyperdense vessel or unexpected calcification. Skull: No acute calvarial abnormality. Sinuses/Orbits: No acute findings Other: None IMPRESSION: No acute intracranial abnormality. Electronically Signed   By: Charlett Nose M.D.   On: 03/27/2023 22:33   ECHOCARDIOGRAM COMPLETE  Result Date: 03/27/2023    ECHOCARDIOGRAM REPORT   Patient Name:   [name] [facility] Date of Exam: 03/27/2023 Medical Rec #:  [id number]       Height:       69.0 in Accession #:    [id number]      Weight:       174.2 lb Date of Birth:  1985/10/16       BSA:          1.948 m Patient Age:    37 years        BP:           183/126 mmHg Patient Gender: M               HR:           110 bpm. Exam Location:  Inpatient Procedure: 2D Echo, Color Doppler and Cardiac Doppler STAT ECHO Indications:    murmur.  History:        Patient has prior history of Echocardiogram examinations, most                 recent 06/03/2022. End stage renal disease, Signs/Symptoms:Chest                 Pain and Shortness of Breath; Risk Factors:Current Smoker.  Sonographer:    Delcie Roch RDCS Referring Phys: [id number] CHRISTOPHER J TEGELER IMPRESSIONS  1. Left ventricular ejection fraction, by estimation, is approximately 55%. The left ventricle has normal function. The left ventricle has no regional wall motion abnormalities. There is moderate concentric left ventricular hypertrophy.  2. Right ventricular systolic function is normal. The right ventricular size is normal. Mildly increased right ventricular wall thickness. Tricuspid regurgitation signal is inadequate for assessing PA pressure.  3. Turbulent and prominent flow noted in LVOT and RVOT with possible dilatation of PA. No obvious left ot right shunt observed. Most likely related to high flow state based on available images.  4. Moderate  pericardial effusion, increased compared to study from January. The pericardial effusion is circumferential. Majority of collection is posterolateral to left ventricle and at right atrium. There is approximately 20% change in mitral inflow and 60% change in tricuspid inflow with respiration suggesting hemodynamic significance. Also early diastolic compression (1/3) of right atrium, but no right ventricular compression. Although not diagnostic of tamponade, would follow clinically and consider reimaging within 24-48 hours.  5. The mitral valve is grossly normal. Trivial mitral valve regurgitation.  6. The aortic  valve is tricuspid. Aortic valve regurgitation is not visualized.  7. The inferior vena cava is normal in size with greater than 50% respiratory variability, suggesting right atrial pressure of 3 mmHg. FINDINGS  Left Ventricle: Left ventricular ejection fraction, by estimation, is 55%. The left ventricle has normal function. The left ventricle has no regional wall motion abnormalities. Global longitudinal strain performed but not reported based on interpreter judgement due to suboptimal tracking. The left ventricular internal cavity size was normal in size. There is moderate concentric left ventricular hypertrophy. Left ventricular diastolic parameters are indeterminate. Right Ventricle: The right ventricular size is normal. Mildly increased right ventricular wall thickness. Right ventricular systolic function is normal. Tricuspid regurgitation signal is inadequate for assessing PA pressure. Left Atrium: Left atrial size was normal in size. Right Atrium: Right atrial size was normal in size. Pericardium: A moderately sized pericardial effusion is present. The pericardial effusion is circumferential. Mitral Valve: The mitral valve is grossly normal. Trivial mitral valve regurgitation. Tricuspid Valve: The tricuspid valve is grossly normal. Tricuspid valve regurgitation is mild. Aortic Valve: The aortic valve  is tricuspid. Aortic valve regurgitation is not visualized. Pulmonic Valve: The pulmonic valve was grossly normal. Pulmonic valve regurgitation is trivial. Aorta: The aortic root and ascending aorta are structurally normal, with no evidence of dilitation. Venous: The inferior vena cava is normal in size with greater than 50% respiratory variability, suggesting right atrial pressure of 3 mmHg. IAS/Shunts: No atrial level shunt detected by color flow Doppler.  LEFT VENTRICLE PLAX 2D LVIDd:         5.20 cm      Diastology LVIDs:         4.00 cm      LV e' medial:  8.27 cm/s LV PW:         1.60 cm      LV e' lateral: 11.70 cm/s LV IVS:        1.10 cm LVOT diam:     2.00 cm LV SV:         76 LV SV Index:   39 LVOT Area:     3.14 cm  LV Volumes (MOD) LV vol d, MOD A4C: 167.0 ml LV vol s, MOD A4C: 75.8 ml LV SV MOD A4C:     167.0 ml RIGHT VENTRICLE             IVC RV Basal diam:  3.20 cm     IVC diam: 2.50 cm RV S prime:     15.60 cm/s TAPSE (M-mode): 2.0 cm LEFT ATRIUM           Index        RIGHT ATRIUM           Index LA Vol (A4C): 65.9 ml 33.82 ml/m  RA Area:     15.60 cm                                    RA Volume:   38.80 ml  19.91 ml/m  AORTIC VALVE LVOT Vmax:   162.00 cm/s LVOT Vmean:  105.000 cm/s LVOT VTI:    0.243 m  AORTA Ao Root diam: 3.10 cm Ao Asc diam:  3.40 cm  SHUNTS Systemic VTI:  0.24 m Systemic Diam: 2.00 cm Nona Dell MD Electronically signed by Nona Dell MD Signature Date/Time: 03/27/2023/7:28:07 PM    Final    DG Chest Port 1 [address]  Result Date: 03/27/2023 CLINICAL DATA:  Questionable sepsis - evaluate for abnormality EXAM: PORTABLE CHEST 1 VIEW COMPARISON:  January 05, 2023 FINDINGS: The cardiomediastinal silhouette is unchanged and enlarged in contour.RIGHT chest CVC with tip terminating over the RIGHT atrium. Perihilar vascular prominence, similar in comparison to priors. No pleural effusion. No pneumothorax. No acute pleuroparenchymal abnormality. IMPRESSION: No acute  cardiopulmonary abnormality. Perihilar vascular congestion. Electronically Signed   By: Meda Klinefelter M.D.   On: 03/27/2023 17:07   Medications:   amLODipine  10 mg Oral Daily   amoxicillin-clavulanate  1 tablet Oral BID   carvedilol  25 mg Oral BID WC   Chlorhexidine Gluconate Cloth  6 each Topical Q0600   [START ON 03/31/2023] darbepoetin (ARANESP) injection - NON-DIALYSIS  150 mcg Subcutaneous Q Tue-1800   diltiazem  300 mg Oral Daily   divalproex  500 mg Oral QHS   heparin  5,000 Units Subcutaneous Q8H   hydrALAZINE  100 mg Oral Q8H   losartan  100 mg Oral Daily   pantoprazole  40 mg Oral Daily

## 2023-03-29 NOTE — Progress Notes (Signed)
Patient return from HD via bed/transporter

## 2023-03-30 ENCOUNTER — Inpatient Hospital Stay (HOSPITAL_COMMUNITY): Payer: Medicare Other

## 2023-03-30 DIAGNOSIS — N186 End stage renal disease: Secondary | ICD-10-CM | POA: Diagnosis not present

## 2023-03-30 DIAGNOSIS — R7881 Bacteremia: Secondary | ICD-10-CM | POA: Diagnosis not present

## 2023-03-30 DIAGNOSIS — I3139 Other pericardial effusion (noninflammatory): Secondary | ICD-10-CM | POA: Diagnosis not present

## 2023-03-30 DIAGNOSIS — I10 Essential (primary) hypertension: Secondary | ICD-10-CM | POA: Diagnosis not present

## 2023-03-30 DIAGNOSIS — I38 Endocarditis, valve unspecified: Secondary | ICD-10-CM

## 2023-03-30 DIAGNOSIS — A419 Sepsis, unspecified organism: Secondary | ICD-10-CM | POA: Diagnosis not present

## 2023-03-30 DIAGNOSIS — M25511 Pain in right shoulder: Secondary | ICD-10-CM | POA: Insufficient documentation

## 2023-03-30 LAB — ECHOCARDIOGRAM COMPLETE
Area-P 1/2: 3.51 cm2
Height: 68 in
MV M vel: 3.49 m/s
MV Peak grad: 48.7 mm[Hg]
S' Lateral: 4 cm
Weight: 2857.16 [oz_av]

## 2023-03-30 LAB — CULTURE, BLOOD (ROUTINE X 2)

## 2023-03-30 MED ORDER — FAMOTIDINE 20 MG PO TABS
20.0000 mg | ORAL_TABLET | Freq: Every day | ORAL | Status: DC | PRN
  Administered 2023-03-30 – 2023-04-02 (×3): 20 mg via ORAL
  Filled 2023-03-30 (×3): qty 1

## 2023-03-30 MED ORDER — LIDOCAINE 5 % EX PTCH
1.0000 | MEDICATED_PATCH | CUTANEOUS | Status: DC
  Administered 2023-03-30 – 2023-04-04 (×6): 1 via TRANSDERMAL
  Filled 2023-03-30 (×6): qty 1

## 2023-03-30 MED ORDER — ACETAMINOPHEN 650 MG RE SUPP: 650.0000 mg | Freq: Four times a day (QID) | RECTAL | Status: DC | PRN

## 2023-03-30 MED ORDER — VANCOMYCIN HCL 750 MG/150ML IV SOLN
750.0000 mg | Freq: Once | INTRAVENOUS | Status: AC
  Administered 2023-03-30: 750 mg via INTRAVENOUS
  Filled 2023-03-30: qty 150

## 2023-03-30 MED ORDER — VANCOMYCIN VARIABLE DOSE PER UNSTABLE RENAL FUNCTION (PHARMACIST DOSING): Status: DC

## 2023-03-30 MED ORDER — CALCIUM ACETATE (PHOS BINDER) 667 MG PO CAPS
667.0000 mg | ORAL_CAPSULE | Freq: Three times a day (TID) | ORAL | Status: DC
  Administered 2023-03-30 – 2023-04-04 (×11): 667 mg via ORAL
  Filled 2023-03-30 (×17): qty 1

## 2023-03-30 MED ORDER — CHLORHEXIDINE GLUCONATE CLOTH 2 % EX PADS: 6.0000 | MEDICATED_PAD | Freq: Every day | CUTANEOUS | Status: DC

## 2023-03-30 MED ORDER — MINOXIDIL 2.5 MG PO TABS
5.0000 mg | ORAL_TABLET | Freq: Two times a day (BID) | ORAL | Status: DC
  Administered 2023-03-31 – 2023-04-04 (×9): 5 mg via ORAL
  Filled 2023-03-30 (×12): qty 2

## 2023-03-30 MED ORDER — ACETAMINOPHEN 500 MG PO TABS
1000.0000 mg | ORAL_TABLET | Freq: Four times a day (QID) | ORAL | Status: DC | PRN
  Administered 2023-03-30 – 2023-04-04 (×11): 1000 mg via ORAL
  Filled 2023-03-30 (×11): qty 2

## 2023-03-30 MED ORDER — CALCIUM CARBONATE ANTACID 500 MG PO CHEW
1.0000 | CHEWABLE_TABLET | Freq: Once | ORAL | Status: AC
  Administered 2023-03-30: 200 mg via ORAL
  Filled 2023-03-30: qty 1

## 2023-03-30 MED ORDER — NIFEDIPINE ER OSMOTIC RELEASE 30 MG PO TB24
30.0000 mg | ORAL_TABLET | Freq: Every day | ORAL | Status: DC
  Administered 2023-03-31 – 2023-04-04 (×5): 30 mg via ORAL
  Filled 2023-03-30 (×5): qty 1

## 2023-03-30 NOTE — Plan of Care (Signed)

## 2023-03-30 NOTE — Progress Notes (Signed)
Pt receives out-pt HD at [facility] [place] on TTS. Will assist as needed.   Shemiah Rosch Renal Navigator [phone] 

## 2023-03-30 NOTE — Assessment & Plan Note (Addendum)
Hgb stable.  No blood in stool during hospitalization thus far -Monitor  -Outpatient follow-up with GI

## 2023-03-30 NOTE — Progress Notes (Signed)
Subjective:  Seen in room. Fevers better, but R shoulder is quite painful and getting worse while here. No further rigors. Had rigors 2x just after starting HD, once at OP unit and once here in the hospital.   Objective Vital signs in last 24 hours: Vitals:   03/29/23 2034 03/29/23 2328 03/30/23 0623 03/30/23 1223  BP:  (!) 153/90 (!) 171/111 (!) 186/117  Pulse: 80 80 82 79  Resp: 20 12 17    Temp: 98.5 F (36.9 C) 97.6 F (36.4 C) 97.8 F (36.6 C) 97.6 F (36.4 C)  TempSrc: Oral Oral Oral Axillary  SpO2: 97% 96% 96%   Weight:      Height:       Weight change:   Physical Exam: General: Alert pleasant hard of hearing Heart: RRR no MRG Lungs: CTA bilaterally nonlabored breathing Abdomen: NABS, soft NTND Extremities: Trace bipedal edema, right upper arm dressing dry clear Dialysis Access: R internal jugular TDC dressing dry clear.  OP HD: TTS [facility]  4h  400/1.5   79.5kg  2/2 bath  RIJ TDC   Heparin none - last OP HD 11/15, post wt 83.6kg (4kg over) - coming off 3- 6 kg over the last 3 wks - usually good compliance, doesn't sign off early very often - rocaltrol 1.25 mcg - mircera 150 mcg IV q 2, last 11/05, due 11/19 - venofer ?     Assessment/ Plan: Fever/sepsis= BC showing gram-positive rod 2/2+. Pt describes having rigors just after starting HD on two occasions here lately, once at the OP unit and the other time was here in the hospital. These reactions, along with the 2/2 +blood cx's, fevers and R shouder pain, strongly suggest that the [facility] is infected and will need to come out.  Will consult IR for cath removal tomorrow, and replacement 48 hrs after cath removal. Have d/w primary team.  HTN/ volume - still having some DBP's > 110, overall improving. On 4 HTN meds including minoxidil. Had HD here on 11/16 w/ 5 L off. Lower vol further tomorrow w/ HD as tolerated.  ESRD -HD TTS. Plan HD tomorrow 1st shift.  Chest pain/moderate pericardial effusion: cardiology  consulted, signing  off at this time wanted repeat echo to ensure not worsening effusion. 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 Darbe 150 mcg sq weekly ordered here weekly on Tuesdays.  Metabolic bone disease -corrected calcium over 10 hold vitamin D on dialysis, phosphorus 10.0 uses PhosLo as binder  Rob Arlean Hopping  MD  [facility] 03/30/2023, 1:09 PM  Recent Labs  Lab 03/28/23 0415 03/28/23 1459 03/29/23 0213  HGB 9.5* 10.8* 10.1*  ALBUMIN 2.9* 3.4*  --   CALCIUM 9.2 9.6 9.4  PHOS 10.0* 10.7*  --   CREATININE 15.59* 16.87* 9.47*  K 4.7 5.0 3.9    Inpatient medications:  calcium acetate  667 mg Oral TID WC   carvedilol  25 mg Oral BID WC   Chlorhexidine Gluconate Cloth  6 each Topical Q0600   [START ON 03/31/2023] darbepoetin (ARANESP) injection - NON-DIALYSIS  150 mcg Subcutaneous Q Tue-1800   divalproex  500 mg Oral QHS   heparin  5,000 Units Subcutaneous Q8H   hydrALAZINE  100 mg Oral Q8H   lidocaine  1 patch Transdermal Q24H   losartan  100 mg Oral Daily   [START ON 03/31/2023] minoxidil  5 mg Oral BID   [START ON 03/31/2023] NIFEdipine  30 mg Oral Daily  pantoprazole  40 mg Oral Daily   tiZANidine  2 mg Oral QHS    acetaminophen **OR** acetaminophen, mouth rinse

## 2023-03-30 NOTE — Progress Notes (Signed)
  Echocardiogram 2D Echocardiogram has been performed.  [name] 03/30/2023, 8:49 AM

## 2023-03-30 NOTE — Consult Note (Signed)
WOC Nurse Consult Note: this consult performed remotely after review of EMR including photo documentation Patient with R upper extremity graft; seen by vascular surgeon 03/28/2023 and was not found to be indurated or fluctuant, no signs of infection  Reason for Consult: RUE wound  Wound type: full thickness post placement of R AV fistula for dialysis  Pressure Injury POA: NA  Measurement: see nursing flowsheet  Wound bed: 100% pink moist  Drainage (amount, consistency, odor) serosanguinous, per vascular no purulence  Periwound: intact scar noted superior to wound  Dressing procedure/placement/frequency: Cleanse RUE wound with NS, apply silver hydrofiber cut to fit wound bed daily. May cover with silicone foam or wrap with Kerlix roll gauze and tape whichever is preferred.   POC discussed with bedside nurse. WOC team will not follow. Re-consult if further needs arise.    Thank you,    Priscella Mann MSN, RN-BC, [employer] [phone]

## 2023-03-30 NOTE — Consult Note (Addendum)
Pharmacy Antibiotic Note  [name] is a 37 y.o. male admitted on 03/27/2023 with sepsis due to bacteremia from Corynebacterium striatum. Pharmacy has been consulted for vancomycin dosing.  PMH: ESRD on HD (Tu, Th, Sa), Alport Syndrome, HTN, deafness & bipolar disorder. Patient was previously admitted 9/30-10/15 for Enterobacter cloacae bacteremia.   Plan: Antibiotic Day 4/10   Vancomycin 750 IV today (11/18) Goal trough 15-20 mcg/mL - Follow-up random vanc level tomorrow morning - Last dose 11/16 before HD - Loading dose 11/15 1,500 mg   Height: 5\' 8"  (172.7 cm) Weight: (P) 81 kg (178 lb 9.2 oz) IBW/kg (Calculated) : 68.4  Temp (24hrs), Avg:98.1 F (36.7 C), Min:97.6 F (36.4 C), Max:98.5 F (36.9 C)  Recent Labs  Lab 03/27/23 1828 03/27/23 1838 03/28/23 0415 03/28/23 1459 03/29/23 0213  WBC 4.9  --  6.2 4.8 3.7*  CREATININE 13.35* 14.10* 15.59* 16.87* 9.47*  LATICACIDVEN  --  0.8  --   --   --     Estimated Creatinine Clearance: 11.6 mL/min (A) (by C-G formula based on SCr of 9.47 mg/dL (H)).    Allergies  Allergen Reactions   Zestril [Lisinopril] Swelling   Nsaids Other (See Comments)    "Kidney problems "   Risperdal [Risperidone] Other (See Comments)    Unknown reaction     Antimicrobials this admission: Vancomycin 11/15 >> 11/16 Cefepime 11/15 >> 11/16 Metronidazole 11/15 >> 11/17 Augmentin 11/17 >> 11/18  Dose adjustments this admission: N/A  Microbiology results: 11/15 BCx: Corynebacterium striatum 2/2 11/15 Wound Cx: ngtd 11/15 BCID: negative 11/15 Resp panel: negative 11/16 MRSA PCR: pending  Thank you for allowing pharmacy to be a part of this patient's care.  Darolyn Rua, PharmD Student [employer]

## 2023-03-30 NOTE — Assessment & Plan Note (Addendum)
Resolved.  Patient has remained afebrile since admission on broad-spectrum ABX.  No leukocytosis , no longer tachycardic.  Pro-Cal > 7, blood cx now showing Corynebacterium Striatum. Echo with no signs of vegetation but did show moderate pericardial effusion, repeating today. Appears much better today than on admission.  - Vancomycin per pharmacy - Discontinued PO augmentin - Tylenol prn

## 2023-03-30 NOTE — Assessment & Plan Note (Addendum)
Suspect worsening pericardial effusion is secondary to volume overload after missing 2 sessions HD. ACS work-up for chest pain inconclusive with stable troponin's (ESRD), likely increased oxygen demand due to effusion and severely elevated blood pressures. Of note EKG with new T-wave inversions on admission, cardiology on board.  Previously noted systolic murmur no longer heard. -Cardiology consulted, recs appreciated, signed off at this time -Repeat ECHO today to ensure effusion is not worsening -Continue HD to get fluid off, consider repeat echo outpatient to monitor

## 2023-03-30 NOTE — Assessment & Plan Note (Addendum)
Nephrology on board.  TTS schedule.  HD 11/16 although cut short to 2.5 hrs due to disruptive behavior.

## 2023-03-30 NOTE — TOC Initial Note (Addendum)
Transition of Care [facility]) - Initial/Assessment Note    Patient Details  Name: ANTAEUS [name] MRN: [id number] Date of Birth: 14-Jul-1985  Transition of Care [facility]) CM/SW Contact:    Harriet Masson, RN Phone Number: 03/30/2023, 2:24 PM  Clinical Narrative:                  Spoke to patient regarding transition needs. Patient states he has been living with sister and other relatives. Patient is suppose to move into his new apartment tomorrow. 03/31/23. Patient does have family support. Patient drives himself and has family to help when needed. Patient states he does take his medications as prescribed. PCP confirmed.  TOC following for needs.  Expected Discharge Plan: Home/Self Care Barriers to Discharge: Continued Medical Work up   Patient Goals and CMS Choice Patient states their goals for this hospitalization and ongoing recovery are:: wants to discharge to move into his new apartment          Expected Discharge Plan and Services       Living arrangements for the past 2 months: Apartment (been staying with sister)                                      Prior Living Arrangements/Services Living arrangements for the past 2 months: Apartment (been staying with sister) Lives with:: Relatives Patient language and need for interpreter reviewed:: Yes Do you feel safe going back to the place where you live?: Yes      Need for Family Participation in Patient Care: Yes (Comment) Care giver support system in place?: Yes (comment)   Criminal Activity/Legal Involvement Pertinent to Current Situation/Hospitalization: No - Comment as needed  Activities of Daily Living   ADL Screening (condition at time of admission) Independently performs ADLs?: Yes (appropriate for developmental age) Is the patient deaf or have difficulty hearing?: Yes Does the patient have difficulty seeing, even when wearing glasses/contacts?: No Does the patient have difficulty concentrating,  remembering, or making decisions?: No  Permission Sought/Granted                  Emotional Assessment Appearance:: Appears stated age Attitude/Demeanor/Rapport: Gracious Affect (typically observed): Accepting Orientation: : Oriented to Self, Oriented to Place, Oriented to  Time Alcohol / Substance Use: Not Applicable Psych Involvement: No (comment)  Admission diagnosis:  Blood in stool [K92.1] Pericardial effusion [I31.39] Systolic murmur [R01.1] ESRD (end stage renal disease) on dialysis (HCC) [N18.6, Z99.2] Sepsis (HCC) [A41.9] Chest pain, unspecified type [R07.9] Hypertension, unspecified type [I10] Sepsis, due to unspecified organism, unspecified whether acute organ dysfunction present (HCC) [A41.9] Heart failure with mildly reduced ejection fraction (HFmrEF) (HCC) [I50.22] Patient Active Problem List   Diagnosis Date Noted   Right shoulder pain 03/30/2023   Moderate Pericardial effusion  HFmrEF  Chest pain 03/28/2023   Blood in stool 03/28/2023   Sepsis (HCC) 03/27/2023   GI bleed 12/04/2022   ESRD on dialysis (HCC) 12/04/2022   Hematemesis 12/03/2022   Acute GI bleeding 12/03/2022   Cannabis use disorder, moderate, in controlled environment, dependence (HCC) 11/13/2022   Cannabis dependence with withdrawal (HCC) 11/13/2022   Anemia of chronic disease 11/12/2022   Hypertension 11/11/2022   Acute cough 11/11/2022   Esophageal dysphagia 11/11/2022   Abnormal barium swallow 11/11/2022   Chronic diastolic CHF (congestive heart failure) (HCC) 11/09/2022   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   Macrocytic anemia  11/08/2022   A-V fistula (HCC) 11/07/2022   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/07/2022   Anemia 09/23/2022   Headache 09/23/2022   Volume overload 09/23/2022   ESRD (end stage renal disease) on dialysis (HCC) 09/23/2022   Atypical chest pain 09/23/2022   Gastritis and gastroduodenitis 06/06/2022   Migraines 06/05/2022    Pancreatitis 06/03/2022   Epigastric abdominal pain 06/03/2022   Abdominal pain 06/03/2022   Rectal bleeding 04/14/2022   Hypertensive crisis 04/13/2022   Heart failure with mildly reduced ejection fraction (HFmrEF) (HCC) 04/08/2022   Bilateral shoulder pain 04/08/2022   Acute otitis externa of both ears 04/08/2022   Pneumonia of right lower lobe due to infectious organism    Dyspnea 03/02/2022   Chest pain 03/02/2022   Hyperkalemia 03/02/2022   Leg pain, bilateral 01/03/2022   Bilateral calf pain    Housing insecurity 01/01/2022   Food insecurity 01/01/2022   Non-compliance with renal dialysis (HCC)    Hearing loss 10/08/2021   Acute lower GI bleeding 06/10/2021   Family history of diabetes mellitus 02/11/2021   Hypoglycemic syndrome 02/11/2021   Rash and nonspecific skin eruption 12/16/2019   Rash on scrotum 12/16/2019   Severe uncontrolled hypertension 06/07/2018   Volume overload  ESRD (end stage renal disease) (HCC) 06/07/2018   Alport syndrome 07/14/2017   GERD (gastroesophageal reflux disease) 07/14/2017   History of appendectomy 07/14/2017   Obesity (BMI 30.0-34.9) 07/14/2017   Pre-transplant evaluation for kidney transplant 07/14/2017   Secondary hyperparathyroidism (HCC) 07/14/2017   Tobacco use 07/14/2017   Bipolar 1 disorder (HCC) 09/01/2013   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 [place] [address] Phone: [phone] Fax: [phone]  [id number] - [place],  - [address] AT [facility] [address] [place] [address] Phone: [phone] Fax: [phone]     Social Determinants of Health (SDOH) Social History: SDOH Screenings   Food Insecurity: No Food Insecurity (03/28/2023)  Housing: Patient Declined (03/28/2023)  Transportation Needs: No Transportation Needs  (03/28/2023)  Utilities: Not At Risk (03/28/2023)  Depression (PHQ2-9): Medium Risk (03/24/2023)  Physical Activity: Inactive (03/24/2023)  Social Connections: Unknown (09/20/2021)   Received from [facility]  Stress: Stress Concern Present (03/24/2023)  Tobacco Use: High Risk (03/28/2023)   SDOH Interventions:     Readmission Risk Interventions     No data to display

## 2023-03-30 NOTE — Hospital Course (Addendum)
[name] is a 37 y.o.male with a history of Alport syndrome with deafness and ESRD on dialysis MWF, recent AV fistula infection, and bipolar disorder who was admitted to the [facility] Medicine Teaching Service at [facility] for sepsis. His hospital course is detailed below:  Sepsis Fever, tachycardia, initial concern for AV fistula infection.  Started on vancomycin, cefepime, Flagyl initially. Vascular surgery consulted and reported low concern for AV fistula infection. Initially narrowed abx to Augmentin due to suspected contaminant of gram positive rods on blood cultures. Blood cultures then grew Cornyebacterium Striatum, suspected source [facility] as pt developed R shoulder pain and rigors during dialysis on 11/18. ID consulted. Vancomycin restarted and augmentin discontinued. TDC was removed on 11/19 and replaced 11/22 without complication.  Blood cultures drawn after TDC removal showed no growth at 2 days. last dialysis was 04/04/23 prior to discharge. Plan to continue vancomycin outpatient at dialysis until 04/13/23, f/u w/ ID on 12/16.   Moderate pericardial effusion Incidentally found when ruling out endocarditis.  Cardiology consulted.  Likely secondary to volume overload from missing dialysis sessions.  Repeat echo showed no progression of effusion. Hypertensive so low suspicion for tamponade. Cardiology recommended another echo outpatient to assess for effusion resolving.   Hypertension Consistently elevated above systolic 160 /diastolic 100 in setting of ESRD, hypovolemia, missed dialysis, medication confusion after multiple admissions. Pt was discharged from [facility] last month on the following regimen that we continued: minoxidil, nifedipine, carvedilol, hydralazine, and losartan.   ESRD on dialysis Missed several sessions prior to admission.  Last session HD during hospitalization on 04/04/2023 with plans for next HD session outpatient on 04/06/2023  Diarrhea Watery, no blood, 3 x /day, noted  during end of hospitalization. No leukocytosis, fever, abdominal pain.  Treated with loperamide.  Other chronic conditions were medically managed with home medications and formulary alternatives as necessary (Alport syndrome, anemia, secondary hyperparathyroidism, bipolar 1 disorder, GERD, migraine)  PCP Follow-up Recommendations: Repeat echo outpatient for pericardial effusion F/u intermittent blood in stool noted by pt during hospitalization, hgb remained stable F/u diarrhea Ensure pt f/u with ID

## 2023-03-30 NOTE — Care Management Important Message (Signed)
Important Message  Patient Details  Name: [name] MRN: [id number] Date of Birth: 1986-01-16   Important Message Given:  Yes - Medicare IM     [name] 03/30/2023, 3:00 PM

## 2023-03-30 NOTE — Assessment & Plan Note (Addendum)
Right shoulder pain, states that it started after his fever broke.  Feels like a muscle.  No chest pain. - Tizanidine 2 mg at bedtime - Tylenol 1000 mg every 6 hours as needed - K-pad - Lidocaine patch - continue to monitor closely

## 2023-03-30 NOTE — Progress Notes (Addendum)
Daily Progress Note Intern Pager: [phone]  Patient name: [name] Medical record number: [id number] Date of birth: 1985/06/11 Age: 37 y.o. Gender: male  Primary Care Provider: [name], DO Consultants: nephrology, cards Code Status: Full  Pt Overview and Major Events to Date:  11/15 - admitted  Assessment and Plan:  [name] is a 37 y.o. male admitted for sepsis of unknown etiology. Patient additionally found to have worsened moderate pericardial effusion, currently undergoing evaluation. 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  Will plan to repeat echo today Assessment & Plan Sepsis (HCC) Resolved.  Patient has remained afebrile since admission on broad-spectrum ABX.  No leukocytosis , no longer tachycardic.  Pro-Cal > 7, blood cx now showing Corynebacterium Striatum. Echo with no signs of vegetation but did show moderate pericardial effusion, repeating today. Appears much better today than on admission.  - Vancomycin per pharmacy - Discontinued PO augmentin - Tylenol prn Moderate Pericardial effusion  HFmrEF  Chest pain Suspect worsening pericardial effusion is secondary to volume overload after missing 2 sessions HD. ACS work-up for chest pain inconclusive with stable troponin's (ESRD), likely increased oxygen demand due to effusion and severely elevated blood pressures. Of note EKG with new T-wave inversions on admission, cardiology on board.  Previously noted systolic murmur no longer heard. -Cardiology consulted, recs appreciated, signed off at this time -Repeat ECHO today to ensure effusion is not worsening -Continue HD to get fluid off, consider repeat echo outpatient to monitor Hypertension Severe, resistant, uncontrolled. Upon chart review, pt was supposed to be discharged on a different medication regimen than what he was on at home. Will resume medications prescribed upon discharge from last hospitalization.   - Minoxidil 5mg  BID, Nifedipine 30mg  ER every day - Continue Carvedilol 25mg  BID, Hydralazine 100mg  TID, Losartan 100mg  every day - Discontinue amlodipine and diltiazem  Right shoulder pain Right shoulder pain, states that it started after his fever broke.  Feels like a muscle.  No chest pain. - Tizanidine 2 mg at bedtime - Tylenol 1000 mg every 6 hours as needed - K-pad - Lidocaine patch - continue to monitor closely Volume overload  ESRD (end stage renal disease) [facility]) Nephrology on board.  TTS schedule.  HD 11/16 although cut short to 2.5 hrs due to disruptive behavior. Blood in stool Hgb stable.  No blood in stool during hospitalization thus far -Monitor  -Outpatient follow-up with GI   Chronic and Stable Problems:  Alport syndrome - Bilateral sensorineural hearing loss Anemia - stable, Hgb 10.9, baseline in 7s Secondary hyperparathyroidism Bipolar 1 Disorder - no meds GERD - holding home protonix for now Migraines - adding back home Depakote 500 mg at bedtime   FEN/GI: renal diet with fluid restriction PPx: heparin Dispo: pending clinical improvement   Subjective:  Pt given tizanidine 2mg  last night for his right shoulder pain. Also given one TUMS last night for heartburn, per night team pt takes many tums at home.  Pt still complaining of right shoulder pain this morning, states it is mainly in the muscle around his shoulder blade. No chest pain, palpitations, or shortness of breath.   Objective: Temp:  [97.6 F (36.4 C)-98.5 F (36.9 C)] 97.8 F (36.6 C) (11/18 0623) Pulse Rate:  [78-84] 82 (11/18 0623) Resp:  [12-20] 17 (11/18 0623) BP: (153-171)/(89-111) 171/111 (11/18 0623) SpO2:  [96 %-98 %] 96 % (11/18 6387) Physical Exam: General: no acute distress, looks much improved from when I  admitted him Cardiovascular: RRR Respiratory: no increased WOB on RA Extremities: no leg swelling BLE. Two lipomas over R shoulder blade. Full ROM RUE but with pain. TTP over  scapula.   Laboratory: Most recent CBC Lab Results  Component Value Date   WBC 3.7 (L) 03/29/2023   HGB 10.1 (L) 03/29/2023   HCT 31.2 (L) 03/29/2023   MCV 88.9 03/29/2023   PLT 125 (L) 03/29/2023   Most recent BMP    Latest Ref Rng & Units 03/29/2023    2:13 AM  BMP  Glucose 70 - 99 mg/dL 696   BUN 6 - 20 mg/dL 37   Creatinine 2.95 - 1.24 mg/dL 2.84   Sodium 132 - 440 mmol/L 137   Potassium 3.5 - 5.1 mmol/L 3.9   Chloride 98 - 111 mmol/L 97   CO2 22 - 32 mmol/L 25   Calcium 8.9 - 10.3 mg/dL 9.4     Edward Delafuente, DO 03/30/2023, 12:05 PM  PGY-1, [facility] FPTS Intern pager: [phone], text pages welcome Secure chat group [facility] Teaching Service

## 2023-03-30 NOTE — Assessment & Plan Note (Addendum)
Severe, resistant, uncontrolled. Upon chart review, pt was supposed to be discharged on a different medication regimen than what he was on at home. Will resume medications prescribed upon discharge from last hospitalization.  - Minoxidil 5mg  BID, Nifedipine 30mg  ER every day - Continue Carvedilol 25mg  BID, Hydralazine 100mg  TID, Losartan 100mg  every day - Discontinue amlodipine and diltiazem

## 2023-03-31 DIAGNOSIS — I10 Essential (primary) hypertension: Secondary | ICD-10-CM | POA: Diagnosis not present

## 2023-03-31 DIAGNOSIS — Z992 Dependence on renal dialysis: Secondary | ICD-10-CM | POA: Diagnosis not present

## 2023-03-31 DIAGNOSIS — I3139 Other pericardial effusion (noninflammatory): Secondary | ICD-10-CM | POA: Diagnosis not present

## 2023-03-31 DIAGNOSIS — Z452 Encounter for adjustment and management of vascular access device: Secondary | ICD-10-CM

## 2023-03-31 DIAGNOSIS — A419 Sepsis, unspecified organism: Secondary | ICD-10-CM | POA: Diagnosis not present

## 2023-03-31 DIAGNOSIS — R7881 Bacteremia: Secondary | ICD-10-CM | POA: Diagnosis not present

## 2023-03-31 DIAGNOSIS — N186 End stage renal disease: Secondary | ICD-10-CM | POA: Diagnosis not present

## 2023-03-31 LAB — RENAL FUNCTION PANEL
Albumin: 2.3 g/dL — ABNORMAL LOW (ref 3.5–5.0)
Anion gap: 15 (ref 5–15)
BUN: 65 mg/dL — ABNORMAL HIGH (ref 6–20)
CO2: 16 mmol/L — ABNORMAL LOW (ref 22–32)
Calcium: 7.3 mg/dL — ABNORMAL LOW (ref 8.9–10.3)
Chloride: 103 mmol/L (ref 98–111)
Creatinine, Ser: 14 mg/dL — ABNORMAL HIGH (ref 0.61–1.24)
GFR, Estimated: 4 mL/min — ABNORMAL LOW (ref >60)
Glucose, Bld: 100 mg/dL — ABNORMAL HIGH (ref 70–99)
Phosphorus: 9.5 mg/dL — ABNORMAL HIGH (ref 2.5–4.6)
Potassium: 3.6 mmol/L (ref 3.5–5.1)
Sodium: 134 mmol/L — ABNORMAL LOW (ref 135–145)

## 2023-03-31 LAB — CBC
HCT: 29.5 % — ABNORMAL LOW (ref 39.0–52.0)
Hemoglobin: 9.5 g/dL — ABNORMAL LOW (ref 13.0–17.0)
MCH: 28.4 pg (ref 26.0–34.0)
MCHC: 32.2 g/dL (ref 30.0–36.0)
MCV: 88.3 fL (ref 80.0–100.0)
Platelets: 169 10*3/uL (ref 150–400)
RBC: 3.34 MIL/uL — ABNORMAL LOW (ref 4.22–5.81)
RDW: 16.4 % — ABNORMAL HIGH (ref 11.5–15.5)
WBC: 4.2 10*3/uL (ref 4.0–10.5)
nRBC: 0 % (ref 0.0–0.2)

## 2023-03-31 LAB — HEPATITIS B SURFACE ANTIBODY, QUANTITATIVE: Hep B S AB Quant (Post): 298 m[IU]/mL

## 2023-03-31 LAB — AEROBIC CULTURE W GRAM STAIN (SUPERFICIAL SPECIMEN): Gram Stain: NONE SEEN

## 2023-03-31 LAB — VANCOMYCIN, RANDOM: Vancomycin Rm: 36 ug/mL

## 2023-03-31 MED ORDER — ANTICOAGULANT SODIUM CITRATE 4% (200MG/5ML) IV SOLN: 5.0000 mL | Status: DC | PRN

## 2023-03-31 MED ORDER — NEPRO/CARBSTEADY PO LIQD: 237.0000 mL | ORAL | Status: DC | PRN

## 2023-03-31 MED ORDER — HEPARIN SODIUM (PORCINE) 1000 UNIT/ML DIALYSIS
1000.0000 [IU] | INTRAMUSCULAR | Status: DC | PRN
  Filled 2023-03-31: qty 1

## 2023-03-31 MED ORDER — LIDOCAINE-EPINEPHRINE 1 %-1:100000 IJ SOLN
10.0000 mL | Freq: Once | INTRAMUSCULAR | Status: AC
  Administered 2023-03-31: 10 mL via INTRADERMAL
  Filled 2023-03-31: qty 10

## 2023-03-31 MED ORDER — PENTAFLUOROPROP-TETRAFLUOROETH EX AERO
1.0000 | INHALATION_SPRAY | CUTANEOUS | Status: DC | PRN
Start: 2023-03-31 — End: 2023-03-31

## 2023-03-31 MED ORDER — LIDOCAINE-PRILOCAINE 2.5-2.5 % EX CREA
1.0000 | TOPICAL_CREAM | CUTANEOUS | Status: DC | PRN
Start: 2023-03-31 — End: 2023-03-31

## 2023-03-31 MED ORDER — LIDOCAINE HCL (PF) 1 % IJ SOLN: 5.0000 mL | INTRAMUSCULAR | Status: DC | PRN

## 2023-03-31 MED ORDER — ALTEPLASE 2 MG IJ SOLR: 2.0000 mg | Freq: Once | INTRAMUSCULAR | Status: DC | PRN

## 2023-03-31 NOTE — Assessment & Plan Note (Addendum)
Right shoulder pain, states that it started after his fever broke.  Feels like a muscle.  No chest pain. Could be secondary to muscle strain vs referred from [facility]. 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 Declined imaging.  - Tizanidine 2 mg at bedtime - Tylenol 1000 mg every 6 hours as needed - K-pad - Lidocaine patch - continue to monitor closely

## 2023-03-31 NOTE — Plan of Care (Signed)

## 2023-03-31 NOTE — Assessment & Plan Note (Addendum)
Hgb stable.  No blood in stool during hospitalization thus far -Monitor  -Outpatient follow-up with GI

## 2023-03-31 NOTE — Assessment & Plan Note (Addendum)
Stable, suspect worsening pericardial effusion is secondary to volume overload after missing 2 sessions HD. ACS work-up for chest pain inconclusive with stable troponin's (ESRD), likely increased oxygen demand due to effusion and severely elevated blood pressures. Of note EKG with new T-wave inversions on admission, cardiology on board.  Previously noted systolic murmur no longer heard. - Cardiology consulted, recs appreciated, signed off at this time - repeat echo yesterday with unchanged moderate pericardial effusion

## 2023-03-31 NOTE — Assessment & Plan Note (Addendum)
Nephrology on board.  TTS schedule.  HD 11/16 although cut short to 2.5 hrs due to disruptive behavior.

## 2023-03-31 NOTE — Procedures (Signed)
[facility] Catheter Removal Procedure Note   Diagnosis: ESRD with bacteremia   Plan:  Remove right diatek catheter   Consent signed:  Yes.   Time out completed:  Yes.   Coumadin:  No. PT/INR (if applicable):   Other labs:     Procedure: 1.  Sterile prepping and draping over catheter area 2. 5 ml 2% lidocaine plain instilled at removal site. 3.  right catheter removed in its entirety with cuff in tact. 4.  Complications: none  5. Tip of catheter sent for culture:  No.     Patient tolerated procedure well:  Yes.   Pressure held, no bleeding noted, dressing applied Instructions given to the pt regarding wound care and bleeding.   Other:   Colby Reels, PA-C  [facility] Catheter Removal Instructions   Please sit up at least 30 degrees for 4 hours then may lay flat   MAY REMOVE DRESSINGS IN AM AND MAY SHOWER   REPLACE DRESSING OVER EXIT SITE AS NEEDED   IF YOU SHOULD NOTE BLEEDING OR SWELLING AT THE NECK HOLD PRESSURE OVER THE DRESSINGS FOR 15 MINUTES, IF BLEEDING PERSISTS COME TO ER OR CALL 911

## 2023-03-31 NOTE — Consult Note (Addendum)
[facility]    Date of Admission:  03/27/2023   Total days of inpatient antibiotics 4        Reason for Consult: Bacteremia    Principal Problem:   Sepsis (HCC) Active Problems:   Volume overload  ESRD (end stage renal disease) (HCC)   Hypertension   Moderate Pericardial effusion  HFmrEF  Chest pain   Blood in stool   Right shoulder pain   Assessment: 37 YM admitted with;  #Corynebacterium striatum bacteremia 2/2 TDC(right chest)? #ESRD on HD  #Right shoulder pain -Patient presented after passing out during dialysis with temp of 103.2.  Blood cultures grew corynebacterium striatum. -aVF site is not seem to be actively infected.  Patient reports some right shoulder pain that started after fever broke.  He has full range of motion, shoulder was not tender on exam.  Will hold off on imaging at this point as no concern for infection at shoulder site. Recommendations:  -Continue vancomycin -Needs TDC out followed by 72 hr line holidays -I called lab to add vanc+ dapto+ linezolid for sens for corynebacterium both sets -TTE (follow results)  #Moderate pericardial effusion - Cardiology consulted, repeat echo as above  #Hypertension - Management per primary Microbiology:   Antibiotics: Vancomycin 11/15- Metronidazole 11/5-11/17 Cefeime 11/17 Augmentin 11/17-   Cultures: Blood 11/15 2/2 corynebacterium striatum   HPI: [name] is a 37 y.o. male with ESRD on HD, hypertension, bipolar disorder, deafness brought in from dialysis after felt passing out during dialysis.  BP elevated, febrile at the time.  He had some right arm pain.  He reports that he has had right arm from status post aVF surgery.  ID engaged as blood cultures grew corynebacterium   Review of Systems: ROS  Past Medical History:  Diagnosis Date   Alport syndrome    Anemia    Bipolar 1 disorder (HCC)    CHF (congestive heart failure) (HCC)    Depression     ESRD (end stage renal disease) on dialysis (HCC)    GERD (gastroesophageal reflux disease)    Headache    Hearing difficulty of left ear    75% hearing   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   Heart murmur    Hypertension    Low blood sugar    Marijuana abuse    Noncompliance    Pericardial effusion 03/28/2023   Tobacco abuse     Social History   Tobacco Use   Smoking status: Every Day    Current packs/day: 0.00    Types: Cigarettes    Last attempt to quit: 02/27/2016    Years since quitting: 7.0   Smokeless tobacco: Never   Tobacco comments:    Returned to smoking 2 black and milds per day  Vaping Use   Vaping status: Never Used  Substance Use Topics   Alcohol use: Not Currently    Comment: rare   Drug use: Yes    Types: Marijuana    Family History  Problem Relation Age of Onset   Hypertension Mother    Kidney failure Mother    Diabetes Mother    Hearing loss Father    Hypertension Sister    Heart disease Maternal Grandmother    Stroke Maternal Grandfather    Asthma Son    Scheduled Meds:  calcium acetate  667 mg Oral TID WC   carvedilol  25 mg Oral BID  WC   Chlorhexidine Gluconate Cloth  6 each Topical Q0600   darbepoetin (ARANESP) injection - NON-DIALYSIS  150 mcg Subcutaneous Q Tue-1800   divalproex  500 mg Oral QHS   heparin  5,000 Units Subcutaneous Q8H   hydrALAZINE  100 mg Oral Q8H   lidocaine  1 patch Transdermal Q24H   losartan  100 mg Oral Daily   minoxidil  5 mg Oral BID   NIFEdipine  30 mg Oral Daily   pantoprazole  40 mg Oral Daily   tiZANidine  2 mg Oral QHS   vancomycin variable dose per unstable renal function (pharmacist dosing)   Does not apply See admin instructions   Continuous Infusions: PRN Meds:.acetaminophen **OR** acetaminophen, famotidine, mouth rinse Allergies  Allergen Reactions   Zestril [Lisinopril] Swelling   Nsaids Other (See Comments)    "Kidney problems "   Risperdal [Risperidone] Other (See Comments)     Unknown reaction     OBJECTIVE: Blood pressure (!) 174/110, pulse 83, temperature 98.3 F (36.8 C), temperature source Oral, resp. rate 18, height 5\' 8"  (1.727 m), weight (P) 81 kg, SpO2 96%.  Physical Exam Constitutional:      General: He is not in acute distress.    Appearance: He is normal weight. He is not toxic-appearing.  HENT:     Head: Normocephalic and atraumatic.     Right Ear: External ear normal.     Left Ear: External ear normal.     Nose: No congestion or rhinorrhea.     Mouth/Throat:     Mouth: Mucous membranes are moist.     Pharynx: Oropharynx is clear.  Eyes:     Extraocular Movements: Extraocular movements intact.     Conjunctiva/sclera: Conjunctivae normal.     Pupils: Pupils are equal, round, and reactive to light.  Cardiovascular:     Rate and Rhythm: Normal rate and regular rhythm.     Heart sounds: No murmur heard.    No friction rub. No gallop.     Comments: Right chest tdc, aAVF Pulmonary:     Effort: Pulmonary effort is normal.     Breath sounds: Normal breath sounds.  Abdominal:     General: Abdomen is flat. Bowel sounds are normal.     Palpations: Abdomen is soft.  Musculoskeletal:        General: No swelling. Normal range of motion.     Cervical back: Normal range of motion and neck supple.  Skin:    General: Skin is warm and dry.  Neurological:     General: No focal deficit present.     Mental Status: He is oriented to person, place, and time.  Psychiatric:        Mood and Affect: Mood normal.     Lab Results Lab Results  Component Value Date   WBC 3.7 (L) 03/29/2023   HGB 10.1 (L) 03/29/2023   HCT 31.2 (L) 03/29/2023   MCV 88.9 03/29/2023   PLT 125 (L) 03/29/2023    Lab Results  Component Value Date   CREATININE 9.47 (H) 03/29/2023   BUN 37 (H) 03/29/2023   NA 137 03/29/2023   K 3.9 03/29/2023   CL 97 (L) 03/29/2023   CO2 25 03/29/2023    Lab Results  Component Value Date   ALT 16 03/27/2023   AST 25 03/27/2023    ALKPHOS 69 03/27/2023   BILITOT 0.7 03/27/2023       Danelle Earthly, MD [facility] 03/31/2023, 6:24 AM I have personally spent 82 minutes involved in face-to-face and non-face-to-face activities for this patient on the day of the visit. Professional time spent includes the following activities: Preparing to see the patient (review of tests), Obtaining and/or reviewing separately obtained history (admission/discharge record), Performing a medically appropriate examination and/or evaluation , Ordering medications/tests/procedures, referring and communicating with other health care professionals, Documenting clinical information in the EMR, Independently interpreting results (not separately reported), Communicating results to the patient/family/caregiver, Counseling and educating the patient/family/caregiver and Care coordination (not separately reported).

## 2023-03-31 NOTE — Progress Notes (Signed)
Subjective:  Seen in dialysis, no c/o's.   Objective Vital signs in last 24 hours: Vitals:   03/31/23 1250 03/31/23 1303 03/31/23 1316 03/31/23 1323  BP: (!) 149/95  (!) 145/95 (!) 169/114  Pulse: 84   84  Resp: 17   16  Temp:      TempSrc:      SpO2: 96%     Weight:  81 kg    Height:       Weight change:   Physical Exam: General: Alert pleasant hard of hearing Heart: RRR no MRG Lungs: CTA bilaterally nonlabored breathing Abdomen: NABS, soft NTND Extremities: Trace bipedal edema, right upper arm dressing dry clear Dialysis Access: R internal jugular TDC dressing dry clear.  OP HD: TTS NW  4h  400/1.5   79.5kg  2/2 bath  RIJ TDC   Heparin none - last OP HD 11/15, post wt 83.6kg (4kg over) - coming off 3- 6 kg over the last 3 wks - usually good compliance, doesn't sign off early very often - rocaltrol 1.25 mcg - mircera 150 mcg IV q 2, last 11/05, due 11/19 - venofer ?     Assessment/ Plan: Fever/sepsis= BC showing gram-positive rod 2/2+. Pt describes having rigors just after starting HD on two occasions here lately, once at the OP unit and the other time was here in the hospital. These reactions, along with the 2/2 +blood cx's, fevers and R shoulder pain suggest the [facility] is infected and will need to come out. Plan is for cath removal and replacement 48 hrs later. Have d/w VVS this afternoon.   HTN/ volume - still having some DBP's > 110, overall improving. On 4 HTN meds including minoxidil. Had HD here on 11/16 w/ 5 L off. Lower vol further tomorrow w/ HD as tolerated.  ESRD -HD TTS. HD this am.  Chest pain/moderate pericardial effusion: cardiology  consulted, signing off at this time wanted repeat echo to ensure not worsening effusion. 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 Darbe 150 mcg sq weekly ordered here weekly on Tuesdays.  Metabolic bone disease -corrected calcium over 10 hold vitamin D on dialysis,  phosphorus 10.0 uses PhosLo as binder  Edward Moselle  MD  [facility] 03/31/2023, 2:48 PM  Recent Labs  Lab 03/28/23 1459 03/29/23 0213 03/31/23 0843  HGB 10.8* 10.1* 9.5*  ALBUMIN 3.4*  --  2.3*  CALCIUM 9.6 9.4 7.3*  PHOS 10.7*  --  9.5*  CREATININE 16.87* 9.47* 14.00*  K 5.0 3.9 3.6    Inpatient medications:  calcium acetate  667 mg Oral TID WC   carvedilol  25 mg Oral BID WC   Chlorhexidine Gluconate Cloth  6 each Topical Q0600   darbepoetin (ARANESP) injection - NON-DIALYSIS  150 mcg Subcutaneous Q Tue-1800   divalproex  500 mg Oral QHS   heparin  5,000 Units Subcutaneous Q8H   hydrALAZINE  100 mg Oral Q8H   lidocaine  1 patch Transdermal Q24H   losartan  100 mg Oral Daily   minoxidil  5 mg Oral BID   NIFEdipine  30 mg Oral Daily   pantoprazole  40 mg Oral Daily   tiZANidine  2 mg Oral QHS   vancomycin variable dose per unstable renal function (pharmacist dosing)   Does not apply See admin instructions    acetaminophen **OR** acetaminophen, famotidine, mouth rinse

## 2023-03-31 NOTE — Progress Notes (Signed)
Daily Progress Note Intern Pager: [phone]  Patient name: [name] Medical record number: [id number] Date of birth: 1985/09/03 Age: 37 y.o. Gender: male  Primary Care Provider: [name], DO Consultants: nephrology, cardiology, infectious disease Code Status: Full  Pt Overview and Major Events to Date:  11/15 - admitted   Assessment and Plan:  MARKEVIOUS ZALOUDEK is a 37 y.o. male admitted for sepsis of unknown etiology. Patient additionally found to have worsened moderate pericardial effusion, currently undergoing evaluation. 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  Assessment & Plan Sepsis (HCC) Resolved, thought to be in setting of TDC.  Patient has remained afebrile since admission on broad-spectrum ABX.  No leukocytosis , no longer tachycardic.  Pro-Cal > 7, blood cx now showing Corynebacterium Striatum. Echo with no signs of vegetation but did show moderate pericardial effusion, repeating today. Appears much better today than on admission.  - infectious disease consulted, appreciate recommendations - Vancomycin per pharmacy - monitor Bcx for sensitivities to vancomycin, daptomycin, linezolid - plan to remove and replace TDC after HD today - Discontinued PO augmentin - Tylenol prn Moderate Pericardial effusion  HFmrEF  Chest pain Stable, suspect worsening pericardial effusion is secondary to volume overload after missing 2 sessions HD. ACS work-up for chest pain inconclusive with stable troponin's (ESRD), likely increased oxygen demand due to effusion and severely elevated blood pressures. Of note EKG with new T-wave inversions on admission, cardiology on board.  Previously noted systolic murmur no longer heard. - Cardiology consulted, recs appreciated, signed off at this time - repeat echo yesterday with unchanged moderate pericardial effusion Hypertension Severe, resistant, uncontrolled. Upon chart review, pt was supposed to be  discharged on a different medication regimen than what he was on at home. Will resume medications prescribed upon discharge from last hospitalization.  - Minoxidil 5mg  BID, Nifedipine 30mg  ER every day - Continue Carvedilol 25mg  BID, Hydralazine 100mg  TID, Losartan 100mg  every day - Discontinue amlodipine and diltiazem  Right shoulder pain Right shoulder pain, states that it started after his fever broke.  Feels like a muscle.  No chest pain. Could be secondary to muscle strain vs referred from [facility]. 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 Declined imaging.  - Tizanidine 2 mg at bedtime - Tylenol 1000 mg every 6 hours as needed - K-pad - Lidocaine patch - continue to monitor closely Volume overload  ESRD (end stage renal disease) [facility]) Nephrology on board.  TTS schedule.  HD 11/16 although cut short to 2.5 hrs due to disruptive behavior. Blood in stool Hgb stable.  No blood in stool during hospitalization thus far -Monitor  -Outpatient follow-up with GI   Chronic and Stable Problems:  Alport syndrome - Bilateral sensorineural hearing loss Anemia - stable, Hgb 10.9, baseline in 7s Secondary hyperparathyroidism Bipolar 1 Disorder - no meds GERD - holding home protonix for now Migraines - adding back home Depakote 500 mg at bedtime   FEN/GI: renal with fluid restriction PPx: heparin Dispo:Home pending clinical improvement   Subjective:  No acute events overnight. States his shoulder pain improved last night with the lidoderm patch. No concerns currently.   Objective: Temp:  [97.6 F (36.4 C)-98.3 F (36.8 C)] 98.3 F (36.8 C) (11/19 0755) Pulse Rate:  [76-90] 81 (11/19 1130) Resp:  [8-20] 11 (11/19 1130) BP: (166-195)/(97-120) 172/99 (11/19 1130) SpO2:  [94 %-100 %] 94 % (11/19 1130) Weight:  [83.9 kg] 83.9 kg (11/19 0755) Physical Exam: General: No  acute distress, sitting in bed at HD Cardiovascular: RRR, no murmurs Respiratory: CTAB, no increased WOB on  RA Extremities: full ROM bilateral shoulders, wound at [facility] covered with bandage.   Laboratory: Most recent CBC Lab Results  Component Value Date   WBC 4.2 03/31/2023   HGB 9.5 (L) 03/31/2023   HCT 29.5 (L) 03/31/2023   MCV 88.3 03/31/2023   PLT 169 03/31/2023   Most recent BMP    Latest Ref Rng & Units 03/31/2023    8:43 AM  BMP  Glucose 70 - 99 mg/dL 295   BUN 6 - 20 mg/dL 65   Creatinine 6.21 - 1.24 mg/dL 30.86   Sodium 578 - 469 mmol/L 134   Potassium 3.5 - 5.1 mmol/L 3.6   Chloride 98 - 111 mmol/L 103   CO2 22 - 32 mmol/L 16   Calcium 8.9 - 10.3 mg/dL 7.3    Echo 62/95/28 1. Left ventricular ejection fraction, by estimation, is 55 to 60% . The left ventricle has normal function. The left ventricle has no regional wall motion abnormalities. There is mild concentric left ventricular hypertrophy. Left ventricular diastolic parameters are consistent with Grade II diastolic dysfunction ( pseudonormalization) . Elevated left atrial pressure. 2. Right ventricular systolic function is normal. The right ventricular size is normal. There is normal pulmonary artery systolic pressure. The estimated right ventricular systolic pressure is 25. 7 mmHg. 3. Left atrial size was moderately dilated. 4. Moderate pericardial effusion. The pericardial effusion is circumferential. There is no evidence of cardiac tamponade. 5. The mitral valve is normal in structure. Trivial mitral valve regurgitation. No evidence of mitral stenosis. 6. The aortic valve is tricuspid. Aortic valve regurgitation is not visualized. No aortic stenosis is present. 7. The inferior vena cava is normal in size with greater than 50% respiratory variability, suggesting right atrial pressure of 3 mmHg.   Carnell Casamento, DO 03/31/2023, 11:44 AM  PGY-1,  Family Medicine FPTS Intern pager: [phone], text pages welcome Secure chat group [facility] Teaching Service

## 2023-03-31 NOTE — Assessment & Plan Note (Addendum)
Resolved, thought to be in setting of TDC.  Patient has remained afebrile since admission on broad-spectrum ABX.  No leukocytosis , no longer tachycardic.  Pro-Cal > 7, blood cx now showing Corynebacterium Striatum. Echo with no signs of vegetation but did show moderate pericardial effusion, repeating today. Appears much better today than on admission.  - infectious disease consulted, appreciate recommendations - Vancomycin per pharmacy - monitor Bcx for sensitivities to vancomycin, daptomycin, linezolid - plan to remove and replace TDC after HD today - Discontinued PO augmentin - Tylenol prn

## 2023-03-31 NOTE — Plan of Care (Signed)
  Problem: Clinical Measurements: Goal: Will remain free from infection Outcome: Progressing Goal: Respiratory complications will improve Outcome: Progressing   Problem: Nutrition: Goal: Adequate nutrition will be maintained Outcome: Progressing   Problem: Coping: Goal: Level of anxiety will decrease Outcome: Progressing   Problem: Safety: Goal: Ability to remain free from injury will improve Outcome: Progressing

## 2023-03-31 NOTE — Progress Notes (Signed)
Received patient in bed to unit.  Alert and oriented.  Informed consent signed and in chart.   TX duration:3.5 hours  Patient tolerated well.  Transported back to the room  Alert, without acute distress.  Hand-off given to patient's nurse.   Access used: catheter Access issues: n/a  Total UF removed: Medication(s) given: Tylenol Post HD weight: 81.0kg   03/31/23 1235  Vitals  Temp 98.3 F (36.8 C)  Temp Source Oral  BP (!) 159/96  MAP (mmHg) 116  Pulse Rate 87  ECG Heart Rate 87  Resp 16  Oxygen Therapy  SpO2 98 %  O2 Device Room Air  Patient Activity (if Appropriate) In bed  During Treatment Monitoring  Blood Flow Rate (mL/min) 0 mL/min  Arterial Pressure (mmHg) 21.21 mmHg  Venous Pressure (mmHg) 523.81 mmHg  TMP (mmHg) 17.37 mmHg  Ultrafiltration Rate (mL/min) 937 mL/min  Dialysate Flow Rate (mL/min) 300 ml/min  Dialysate Potassium Concentration 3  Dialysate Calcium Concentration 2.5  Duration of HD Treatment -hour(s) 3.5 hour(s)  Cumulative Fluid Removed (mL) per Treatment  2500.14  HD Safety Checks Performed Yes  Intra-Hemodialysis Comments Tx completed  Post Treatment  Dialyzer Clearance Lightly streaked  Liters Processed 73.5  Fluid Removed (mL) 2500 mL  Tolerated HD Treatment Yes  Hemodialysis Catheter Right Internal jugular Double lumen Permanent (Tunneled)  Placement Date/Time: 11/07/22 0816   Serial / Lot #: [id number]  Time Out: Correct patient;Correct site;Correct procedure  Maximum sterile barrier precautions: Hand hygiene;Cap;Mask;Sterile gown;Sterile gloves;Large sterile sheet  Site Prep: Chlorhexid...  Site Condition No complications  Blue Lumen Status Heparin locked  Red Lumen Status Heparin locked  Catheter fill solution Heparin 1000 units/ml  Catheter fill volume (Arterial) 1.9 cc  Catheter fill volume (Venous) 1.9  Dressing Type Transparent  Dressing Status Antimicrobial disc in place  Interventions New dressing  Drainage  Description None  Dressing Change Due 04/07/23  Post treatment catheter status Capped and Clamped       [facility]

## 2023-03-31 NOTE — Consult Note (Addendum)
Pharmacy Antibiotic Note  [name] is a 37 y.o. male admitted on 03/27/2023 with sepsis due to bacteremia from Corynebacterium striatum. Pharmacy has been consulted for vancomycin dosing.  PMH: ESRD on HD (Tu, Th, Sa), Alport Syndrome, HTN, deafness & bipolar disorder. Patient was previously admitted 9/30-10/15 for Enterobacter cloacae bacteremia.   Plan: Antibiotic Day 5   Random vancomycin level 11/19 @ 0843 (Drawn before HD): 36  Per ID, plan for 72 hour line holiday after HD session today  Hold vancomycin dose today - Follow up HD schedule and new line placement - Follow up susceptibilities - Last dose 11/18  - Loading dose 11/15 1,500 mg    Height: 5\' 8"  (172.7 cm) Weight: 83.9 kg (184 lb 15.5 oz) IBW/kg (Calculated) : 68.4  Temp (24hrs), Avg:98.1 F (36.7 C), Min:97.6 F (36.4 C), Max:98.3 F (36.8 C)  Recent Labs  Lab 03/27/23 1828 03/27/23 1838 03/28/23 0415 03/28/23 1459 03/29/23 0213 03/31/23 0843  WBC 4.9  --  6.2 4.8 3.7* 4.2  CREATININE 13.35* 14.10* 15.59* 16.87* 9.47* 14.00*  LATICACIDVEN  --  0.8  --   --   --   --   VANCORANDOM  --   --   --   --   --  36    Estimated Creatinine Clearance: 7.8 mL/min (A) (by C-G formula based on SCr of 14 mg/dL (H)).    Allergies  Allergen Reactions   Zestril [Lisinopril] Swelling   Nsaids Other (See Comments)    "Kidney problems "   Risperdal [Risperidone] Other (See Comments)    Unknown reaction     Antimicrobials this admission: Vancomycin 11/15 >> 11/16 Cefepime 11/15 >> 11/16 Metronidazole 11/15 >> 11/17 Augmentin 11/17 >> 11/18  Dose adjustments this admission: N/A  Microbiology results: 11/15 BCx: Corynebacterium striatum 2/2 11/15 Wound Cx: ngtd 11/15 BCID: negative 11/15 Resp panel: negative 11/16 MRSA PCR: pending  Thank you for allowing pharmacy to be a part of this patient's care.  Darolyn Rua, PharmD Student [employer]

## 2023-03-31 NOTE — Assessment & Plan Note (Addendum)
Severe, resistant, uncontrolled. Upon chart review, pt was supposed to be discharged on a different medication regimen than what he was on at home. Will resume medications prescribed upon discharge from last hospitalization.  - Minoxidil 5mg  BID, Nifedipine 30mg  ER every day - Continue Carvedilol 25mg  BID, Hydralazine 100mg  TID, Losartan 100mg  every day - Discontinue amlodipine and diltiazem

## 2023-04-01 DIAGNOSIS — A419 Sepsis, unspecified organism: Secondary | ICD-10-CM | POA: Diagnosis not present

## 2023-04-01 DIAGNOSIS — I10 Essential (primary) hypertension: Secondary | ICD-10-CM | POA: Diagnosis not present

## 2023-04-01 DIAGNOSIS — N186 End stage renal disease: Secondary | ICD-10-CM | POA: Diagnosis not present

## 2023-04-01 DIAGNOSIS — I3139 Other pericardial effusion (noninflammatory): Secondary | ICD-10-CM | POA: Diagnosis not present

## 2023-04-01 MED ORDER — CALCIUM GLUCONATE-NACL 2-0.675 GM/100ML-% IV SOLN
2.0000 g | Freq: Once | INTRAVENOUS | Status: AC
  Administered 2023-04-01: 2000 mg via INTRAVENOUS
  Filled 2023-04-01: qty 100

## 2023-04-01 MED ORDER — POTASSIUM CHLORIDE CRYS ER 20 MEQ PO TBCR
20.0000 meq | EXTENDED_RELEASE_TABLET | Freq: Once | ORAL | Status: AC
  Administered 2023-04-01: 20 meq via ORAL
  Filled 2023-04-01: qty 1

## 2023-04-01 MED ORDER — TIZANIDINE HCL 4 MG PO TABS
2.0000 mg | ORAL_TABLET | Freq: Four times a day (QID) | ORAL | Status: DC | PRN
  Administered 2023-04-01 – 2023-04-04 (×6): 2 mg via ORAL
  Filled 2023-04-01 (×6): qty 1

## 2023-04-01 MED ORDER — CAMPHOR-MENTHOL 0.5-0.5 % EX LOTN
TOPICAL_LOTION | CUTANEOUS | Status: DC | PRN
  Filled 2023-04-01 (×3): qty 222

## 2023-04-01 MED ORDER — SODIUM CHLORIDE 0.9% FLUSH
10.0000 mL | Freq: Two times a day (BID) | INTRAVENOUS | Status: DC
Start: 2023-04-01 — End: 2023-04-02
  Administered 2023-04-01: 10 mL via INTRAVENOUS

## 2023-04-01 NOTE — Progress Notes (Signed)
    Plan for [facility] replacement Friday 11/22 with Dr. Lenell Antu in the Cath Lab and will place n.p.o. order Thursday midnight.   Revanth Neidig C. Randie Heinz, MD [facility] Office: [phone] Pager: [phone]

## 2023-04-01 NOTE — Assessment & Plan Note (Addendum)
Nephrology on board.  TTS schedule.  Last HD 11/19.

## 2023-04-01 NOTE — Progress Notes (Signed)
Pt got up took leads off and took a shower without notifying staff- educated patient that he needs an MD order for a shower and that he could have jeopardized his safety.

## 2023-04-01 NOTE — Assessment & Plan Note (Deleted)
Hgb stable.  No blood in stool during hospitalization thus far -Monitor  -Outpatient follow-up with GI

## 2023-04-01 NOTE — Assessment & Plan Note (Addendum)
Severe, resistant, uncontrolled. Restarted medications pt was discharged on during last hospitalization. - Minoxidil 5mg  BID, Nifedipine 30mg  ER every day - Continue Carvedilol 25mg  BID, Hydralazine 100mg  TID, Losartan 100mg  every day

## 2023-04-01 NOTE — Progress Notes (Signed)
Daily Progress Note Intern Pager: [phone]  Patient name: [name] Medical record number: [id number] Date of birth: [birth year] Age: 37 y.o. Gender: male  Primary Care Provider: [name], DO Consultants: nephrology, cardiology, infectious disease  Code Status: Full   Pt Overview and Major Events to Date:  11/15 - admitted  11/19 [facility] removed  Assessment and Plan:  [name] is a 37 y.o. male admitted for sepsis of unknown etiology. Patient additionally found to have worsened moderate pericardial effusion, currently undergoing evaluation. 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  Assessment & Plan Sepsis (HCC) Resolved, thought to be in setting of infected TDC. Bcx positive for Corynebacterium striatum. TDC removed yesterday, plan to replace tomorrow. - infectious disease consulted, appreciate recommendations - nephrology consulted, appreciate recommendations - Vancomycin per pharmacy - monitor Bcx for sensitivities to vancomycin, daptomycin, linezolid - TEE pending per ID - Tylenol prn Moderate Pericardial effusion  HFmrEF  Chest pain Incidentally noticed on initial echo. Repeat echo effusion remained stable. Suspected pericardial effusion is secondary to volume overload after missing 2 sessions HD. ACS work-up for chest pain unremarkable.  - Cardiology consulted initially, recs appreciated, signed off at this time - repeat echo with unchanged moderate pericardial effusion Hypertension Severe, resistant, uncontrolled. Restarted medications pt was discharged on during last hospitalization. - Minoxidil 5mg  BID, Nifedipine 30mg  ER every day - Continue Carvedilol 25mg  BID, Hydralazine 100mg  TID, Losartan 100mg  every day Right shoulder pain Right shoulder pain, states that it started after his fever broke.  Feels like a muscle.  No chest pain. Could be secondary to muscle strain vs referred from [facility]. Two likely lipomas noted  on his posterior shoulder. Declined imaging. Not complaining of shoulder pain today.  - Tizanidine 2 mg at bedtime - Tylenol 1000 mg every 6 hours as needed - K-pad - Lidocaine patch - continue to monitor closely Volume overload  ESRD (end stage renal disease) [facility]) Nephrology on board.  TTS schedule.  Last HD 11/19.    Chronic and Stable Problems:  Alport syndrome - Bilateral sensorineural hearing loss Anemia - stable, Hgb 10.9, baseline in 7s Secondary hyperparathyroidism Bipolar 1 Disorder - no meds GERD - holding home protonix for now Migraines - adding back home Depakote 500 mg at bedtime   FEN/GI: renal with fluid restriction PPx: heparin Dispo:Home pending clinical improvement   Subjective:  No acute events overnight.  Complains of intermittent bilateral ankle pain that started last night.  Denies new leg swelling, tenderness to his calves.  No other concerns or complaints today.  Does not mention shoulder pain today.  Objective: Temp:  [98 F (36.7 C)-98.4 F (36.9 C)] 98.4 F (36.9 C) (11/20 0250) Pulse Rate:  [76-93] 84 (11/20 0250) Resp:  [8-20] 20 (11/20 0250) BP: (145-183)/(94-120) 154/94 (11/20 0250) SpO2:  [94 %-100 %] 96 % (11/20 0250) Weight:  [81 kg] 81 kg (11/19 1303) Physical Exam: General: Sitting up in bed eating breakfast, no acute distress Cardiovascular: Regular rate and rhythm, no murmurs Respiratory: No difficulty breathing on room air, speaking in complete sentences Extremities: No new leg swelling bilateral lower extremities.  No redness, warmth, tenderness to bilateral calves.  No tenderness to palpation over bilateral ankles.  Has significant pain when he presses on his own ankles.  Laboratory: Most recent CBC Lab Results  Component Value Date   WBC 4.2 03/31/2023   HGB 9.5 (L) 03/31/2023   HCT 29.5 (L) 03/31/2023   MCV  88.3 03/31/2023   PLT 169 03/31/2023   Most recent BMP    Latest Ref Rng & Units 03/31/2023    8:43 AM  BMP   Glucose 70 - 99 mg/dL 485   BUN 6 - 20 mg/dL 65   Creatinine 4.62 - 1.24 mg/dL 70.35   Sodium 009 - 381 mmol/L 134   Potassium 3.5 - 5.1 mmol/L 3.6   Chloride 98 - 111 mmol/L 103   CO2 22 - 32 mmol/L 16   Calcium 8.9 - 10.3 mg/dL 7.3     Nixxon Faria, DO 04/01/2023, 9:19 AM  PGY-1, [place] [facility] FPTS Intern pager: [phone], text pages welcome Secure chat group [facility] Teaching Service

## 2023-04-01 NOTE — Assessment & Plan Note (Addendum)
Incidentally noticed on initial echo. Repeat echo effusion remained stable. Suspected pericardial effusion is secondary to volume overload after missing 2 sessions HD. ACS work-up for chest pain unremarkable.  - Cardiology consulted initially, recs appreciated, signed off at this time - repeat echo with unchanged moderate pericardial effusion

## 2023-04-01 NOTE — Assessment & Plan Note (Addendum)
Resolved, thought to be in setting of infected TDC. Bcx positive for Corynebacterium striatum. TDC removed yesterday, plan to replace tomorrow. - infectious disease consulted, appreciate recommendations - nephrology consulted, appreciate recommendations - Vancomycin per pharmacy - monitor Bcx for sensitivities to vancomycin, daptomycin, linezolid - TEE pending per ID - Tylenol prn

## 2023-04-01 NOTE — Progress Notes (Signed)
Subjective:  Seen in room. TDC removed by VVS yesterday. Main c/o's are leg cramps and bilat ankle pain and swelling  Objective Vital signs in last 24 hours: Vitals:   03/31/23 1323 03/31/23 2022 03/31/23 2300 04/01/23 0250  BP: (!) 169/114 (!) 169/111 (!) 149/101 (!) 154/94  Pulse: 84 93  84  Resp: 16 20 14 20   Temp:  98 F (36.7 C) 98.2 F (36.8 C) 98.4 F (36.9 C)  TempSrc:  Oral Oral Oral  SpO2:  96%  96%  Weight:      Height:       Weight change:   Physical Exam: General: Alert pleasant hard of hearing, reads lips Heart: RRR no MRG Lungs: CTA bilaterally nonlabored breathing Abdomen: NABS, soft NTND Extremities: no LE edema, bilat ankle joints not red or swollen Dialysis Access: RIJ TDC removed   Renal-related home meds: - norvasc 10 - phoslo 1 ac - coreg 25 bid - diltiazem 300 every day - hydralazine 100 tid - losartan 100 qd   OP HD: TTS NW  4h  400/1.5   79.5kg  2/2 bath  RIJ TDC   Heparin none - last OP HD 11/15, post wt 83.6kg (4kg over) - coming off 3- 6 kg over the last 3 wks - usually good compliance, doesn't sign off early very often - rocaltrol 1.25 mcg - mircera 150 mcg IV q 2, last 11/05, due 11/19 - venofer ?     Assessment/ Plan: Fever/sepsis= BC showing gram-positive rod 2/2+ for corynebacterium striatum. Seen by ID, recommends line holiday x 72 hrs. TDC removed 11/19 by VVS. They will have him on schedule for replacing TDC this Friday.  HTN/ volume - per primary team, [facility] contacted and his BP regimen should be minoxidil 5 bid, losartan 100 qd, hydralazine 100 tid, procardia 30 every day.  Not taking norvasc/ diltiazem. Is on coreg 25 bid here as well.  ESRD -HD TTS. Next HD Friday after new [facility] placement.  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 Darbe 150 mcg sq weekly ordered here weekly on Tuesdays.  Metabolic bone disease -corrected calcium over 10 hold vitamin D on dialysis, phosphorus 10.0 uses PhosLo as  binder  Edward Moselle  MD  [facility] 04/01/2023, 12:16 PM  Recent Labs  Lab 03/28/23 1459 03/29/23 0213 03/31/23 0843  HGB 10.8* 10.1* 9.5*  ALBUMIN 3.4*  --  2.3*  CALCIUM 9.6 9.4 7.3*  PHOS 10.7*  --  9.5*  CREATININE 16.87* 9.47* 14.00*  K 5.0 3.9 3.6    Inpatient medications:  calcium acetate  667 mg Oral TID WC   carvedilol  25 mg Oral BID WC   Chlorhexidine Gluconate Cloth  6 each Topical Q0600   darbepoetin (ARANESP) injection - NON-DIALYSIS  150 mcg Subcutaneous Q Tue-1800   divalproex  500 mg Oral QHS   heparin  5,000 Units Subcutaneous Q8H   hydrALAZINE  100 mg Oral Q8H   lidocaine  1 patch Transdermal Q24H   losartan  100 mg Oral Daily   minoxidil  5 mg Oral BID   NIFEdipine  30 mg Oral Daily   pantoprazole  40 mg Oral Daily   vancomycin variable dose per unstable renal function (pharmacist dosing)   Does not apply See admin instructions    calcium gluconate 2,000 mg (04/01/23 1136)   acetaminophen **OR** acetaminophen, camphor-menthol, famotidine, mouth rinse, tiZANidine

## 2023-04-01 NOTE — Assessment & Plan Note (Addendum)
Right shoulder pain, states that it started after his fever broke.  Feels like a muscle.  No chest pain. Could be secondary to muscle strain vs referred from [facility]. 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 Declined imaging. Not complaining of shoulder pain today.  - Tizanidine 2 mg at bedtime - Tylenol 1000 mg every 6 hours as needed - K-pad - Lidocaine patch - continue to monitor closely

## 2023-04-01 NOTE — Progress Notes (Signed)
   [facility] has been requested to perform a transesophageal echocardiogram on Arlyn L Soyars for evaluation of bacteremia.     The patient does NOT have any absolute or relative contraindications to a Transesophageal Echocardiogram (TEE).  The patient has: No other conditions that may impact this procedure.   After careful review of history and examination, the risks and benefits of transesophageal echocardiogram have been explained including risks of esophageal damage, perforation (1:10,000 risk), bleeding, pharyngeal hematoma as well as other potential complications associated with conscious sedation including aspiration, arrhythmia, respiratory failure and death. Alternatives to treatment were discussed, questions were answered. Patient is willing to proceed.   Signed, Jonita Albee, PA-C  04/01/2023 4:24 PM

## 2023-04-01 NOTE — H&P (View-Only) (Signed)
Subjective:  Seen in room. TDC removed by [facility] yesterday. Main c/o's are leg cramps and bilat ankle pain and swelling  Objective Vital signs in last 24 hours: Vitals:   03/31/23 1323 03/31/23 2022 03/31/23 2300 04/01/23 0250  BP: (!) 169/114 (!) 169/111 (!) 149/101 (!) 154/94  Pulse: 84 93  84  Resp: 16 20 14 20   Temp:  98 F (36.7 C) 98.2 F (36.8 C) 98.4 F (36.9 C)  TempSrc:  Oral Oral Oral  SpO2:  96%  96%  Weight:      Height:       Weight change:   Physical Exam: General: Alert pleasant hard of hearing, reads lips Heart: RRR no MRG Lungs: CTA bilaterally nonlabored breathing Abdomen: NABS, soft NTND Extremities: no LE edema, bilat ankle joints not red or swollen Dialysis Access: RIJ TDC removed   Renal-related home meds: - norvasc 10 - phoslo 1 ac - coreg 25 bid - diltiazem 300 every day - hydralazine 100 tid - losartan 100 qd   OP HD: TTS NW  4h  400/1.5   79.5kg  2/2 bath  RIJ TDC   Heparin none - last OP HD 11/15, post wt 83.6kg (4kg over) - coming off 3- 6 kg over the last 3 wks - usually good compliance, doesn't sign off early very often - rocaltrol 1.25 mcg - mircera 150 mcg IV q 2, last 11/05, due 11/19 - venofer ?     Assessment/ Plan: Fever/sepsis= BC showing gram-positive rod 2/2+ for corynebacterium striatum. Seen by ID, recommends line holiday x 72 hrs. TDC removed 11/19 by [facility]. They will have him on schedule for replacing TDC this Friday.  HTN/ volume - per primary team, WF contacted and his BP regimen should be minoxidil 5 bid, losartan 100 qd, hydralazine 100 tid, procardia 30 every day.  Not taking norvasc/ diltiazem. Is on coreg 25 bid here as well.  ESRD -HD TTS. Next HD Friday after new [facility] placement.  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 Darbe 150 mcg sq weekly ordered here weekly on Tuesdays.  Metabolic bone disease -corrected calcium over 10 hold vitamin D on dialysis, phosphorus 10.0 uses PhosLo as  binder  Vinson Moselle  MD  [facility] 04/01/2023, 12:16 PM  Recent Labs  Lab 03/28/23 1459 03/29/23 0213 03/31/23 0843  HGB 10.8* 10.1* 9.5*  ALBUMIN 3.4*  --  2.3*  CALCIUM 9.6 9.4 7.3*  PHOS 10.7*  --  9.5*  CREATININE 16.87* 9.47* 14.00*  K 5.0 3.9 3.6    Inpatient medications:  calcium acetate  667 mg Oral TID WC   carvedilol  25 mg Oral BID WC   Chlorhexidine Gluconate Cloth  6 each Topical Q0600   darbepoetin (ARANESP) injection - NON-DIALYSIS  150 mcg Subcutaneous Q Tue-1800   divalproex  500 mg Oral QHS   heparin  5,000 Units Subcutaneous Q8H   hydrALAZINE  100 mg Oral Q8H   lidocaine  1 patch Transdermal Q24H   losartan  100 mg Oral Daily   minoxidil  5 mg Oral BID   NIFEdipine  30 mg Oral Daily   pantoprazole  40 mg Oral Daily   vancomycin variable dose per unstable renal function (pharmacist dosing)   Does not apply See admin instructions    calcium gluconate 2,000 mg (04/01/23 1136)   acetaminophen **OR** acetaminophen, camphor-menthol, famotidine, mouth rinse, tiZANidine

## 2023-04-01 NOTE — Plan of Care (Signed)

## 2023-04-01 NOTE — Progress Notes (Signed)
Nurse tech went in to do vitals, patient wouldn't acknowledge her. Went into patients room about 45 min later to take vitals and give meds, stated patients name multiple times and shook patient. Patient looked at me and I stated that I needed to get vitals, connect back to tele and give meds, patient rolled over and started "snoring" louder and louder while I  stated patients name a few more times while nudging patient some and trying to get blood pressure cuff on left arm.

## 2023-04-02 ENCOUNTER — Inpatient Hospital Stay (HOSPITAL_COMMUNITY): Payer: Medicare Other

## 2023-04-02 ENCOUNTER — Encounter (HOSPITAL_COMMUNITY)
Admission: EM | Disposition: A | Discharge status: Home / Self Care | Payer: Self-pay | Source: Home / Self Care | Attending: Family Medicine

## 2023-04-02 DIAGNOSIS — R7881 Bacteremia: Secondary | ICD-10-CM

## 2023-04-02 DIAGNOSIS — I3139 Other pericardial effusion (noninflammatory): Secondary | ICD-10-CM

## 2023-04-02 DIAGNOSIS — I340 Nonrheumatic mitral (valve) insufficiency: Secondary | ICD-10-CM

## 2023-04-02 HISTORY — PX: TRANSESOPHAGEAL ECHOCARDIOGRAM (CATH LAB): EP1270

## 2023-04-02 LAB — RENAL FUNCTION PANEL
Albumin: 3.2 g/dL — ABNORMAL LOW (ref 3.5–5.0)
Anion gap: 16 — ABNORMAL HIGH (ref 5–15)
BUN: 68 mg/dL — ABNORMAL HIGH (ref 6–20)
CO2: 18 mmol/L — ABNORMAL LOW (ref 22–32)
Calcium: 8.8 mg/dL — ABNORMAL LOW (ref 8.9–10.3)
Chloride: 92 mmol/L — ABNORMAL LOW (ref 98–111)
Creatinine, Ser: 15.5 mg/dL — ABNORMAL HIGH (ref 0.61–1.24)
GFR, Estimated: 4 mL/min — ABNORMAL LOW (ref >60)
Glucose, Bld: 70 mg/dL (ref 70–99)
Phosphorus: 10.6 mg/dL — ABNORMAL HIGH (ref 2.5–4.6)
Potassium: 5 mmol/L (ref 3.5–5.1)
Sodium: 126 mmol/L — ABNORMAL LOW (ref 135–145)

## 2023-04-02 LAB — ECHO TEE

## 2023-04-02 SURGERY — TRANSESOPHAGEAL ECHOCARDIOGRAM (TEE) (CATHLAB)
Anesthesia: Monitor Anesthesia Care

## 2023-04-02 MED ORDER — CHLORHEXIDINE GLUCONATE CLOTH 2 % EX PADS
6.0000 | MEDICATED_PAD | Freq: Every day | CUTANEOUS | Status: DC
  Administered 2023-04-03 – 2023-04-04 (×2): 6 via TOPICAL

## 2023-04-02 MED ORDER — PROPOFOL 10 MG/ML IV BOLUS
INTRAVENOUS | Status: DC | PRN
  Administered 2023-04-02: 100 mg via INTRAVENOUS
  Administered 2023-04-02: 50 mg via INTRAVENOUS
  Administered 2023-04-02 (×2): 40 mg via INTRAVENOUS
  Administered 2023-04-02: 50 mg via INTRAVENOUS

## 2023-04-02 MED ORDER — LIDOCAINE 2% (20 MG/ML) 5 ML SYRINGE
INTRAMUSCULAR | Status: DC | PRN
  Administered 2023-04-02: 20 mg via INTRAVENOUS

## 2023-04-02 MED ORDER — SODIUM CHLORIDE 0.9 % IV SOLN: INTRAVENOUS | Status: DC | PRN

## 2023-04-02 NOTE — Interval H&P Note (Signed)
History and Physical Interval Note:  04/02/2023 10:35 AM  [name]  has presented today for surgery, with the diagnosis of bacteremia.  The various methods of treatment have been discussed with the patient and family. After consideration of risks, benefits and other options for treatment, the patient has consented to  Procedure(s): TRANSESOPHAGEAL ECHOCARDIOGRAM (N/A) as a surgical intervention.  The patient's history has been reviewed, patient examined, no change in status, stable for surgery.  I have reviewed the patient's chart and labs.  Questions were answered to the patient's satisfaction.     [employer]

## 2023-04-02 NOTE — Assessment & Plan Note (Addendum)
Nephrology on board.  TTS schedule.  Last HD 11/19.

## 2023-04-02 NOTE — Transfer of Care (Signed)
Immediate Anesthesia Transfer of Care Note  Patient: [name]  Procedure(s) Performed: TRANSESOPHAGEAL ECHOCARDIOGRAM  Patient Location: PACU and Cath Lab  Anesthesia Type:MAC  Level of Consciousness: drowsy, patient cooperative, and responds to stimulation  Airway & Oxygen Therapy: Patient Spontanous Breathing and Patient connected to nasal cannula oxygen  Post-op Assessment: Report given to RN and Post -op Vital signs reviewed and stable  Post vital signs: Reviewed and stable  Last Vitals:  Vitals Value Taken Time  BP    Temp    Pulse    Resp    SpO2      Last Pain:  Vitals:   04/02/23 0926  TempSrc: Temporal  PainSc:       Patients Stated Pain Goal: 0 (04/01/23 1220)  Complications: No notable events documented.

## 2023-04-02 NOTE — Progress Notes (Signed)
Daily Progress Note Intern Pager: [phone]  Patient name: [name] Medical record number: [id number] Date of birth: Aug 25, 1985 Age: 37 y.o. Gender: male  Primary Care Provider: [name], DO Consultants: nephrology, cardiology, infectious disease  Code Status: Full     Pt Overview and Major Events to Date:  11/15 - admitted  11/19 [facility] removed  Assessment and Plan:  37 y.o. male admitted for sepsis of unknown etiology. Patient additionally found to have worsened moderate pericardial effusion, currently undergoing evaluation. 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  Pending TDC placement tomorrow, hopeful to discharge after that Assessment & Plan Sepsis (HCC) Resolved, thought to be in setting of infected TDC. Bcx positive for Corynebacterium striatum. TDC removed yesterday, plan to replace tomorrow. - infectious disease consulted, appreciate recommendations - nephrology consulted, appreciate recommendations - Vancomycin per pharmacy - monitor Bcx for sensitivities to vancomycin, daptomycin, linezolid - TEE unremarkable - Tylenol prn Moderate Pericardial effusion  HFmrEF  Chest pain Incidentally noticed on initial echo. Repeat echo effusion remained stable. Suspected pericardial effusion is secondary to volume overload after missing 2 sessions HD. ACS work-up for chest pain unremarkable.  - Cardiology consulted initially, recs appreciated, signed off at this time - repeat echo with unchanged moderate pericardial effusion Hypertension Severe, resistant, uncontrolled. Restarted medications pt was discharged on during last hospitalization. - Minoxidil 5mg  BID, Nifedipine 30mg  ER every day - Continue Carvedilol 25mg  BID, Hydralazine 100mg  TID, Losartan 100mg  every day Right shoulder pain Right shoulder pain, states that it started after his fever broke.  Feels like a muscle.  No chest pain. Could be secondary to muscle strain  vs referred from [facility]. 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 Declined imaging. Not complaining of shoulder pain today.  - Tizanidine 2 mg at bedtime - Tylenol 1000 mg every 6 hours as needed - K-pad - Lidocaine patch - continue to monitor closely Volume overload  ESRD (end stage renal disease) [facility]) Nephrology on board.  TTS schedule.  Last HD 11/19.    Chronic and Stable Problems:  Alport syndrome - Bilateral sensorineural hearing loss Anemia - stable, Hgb 10.9, baseline in 7s Secondary hyperparathyroidism Bipolar 1 Disorder - no meds GERD - holding home protonix for now Migraines - adding back home Depakote 500 mg at bedtime     FEN/GI: renal with fluid restriction  PPx: heparin  Dispo:Home pending clinical improvement   Subjective:  No acute events overnight.  Complains of chronic back pain unchanged from usual.  Objective: Temp:  [97.8 F (36.6 C)-99.3 F (37.4 C)] 98.3 F (36.8 C) (11/21 1128) Pulse Rate:  [78-95] 78 (11/21 1130) Resp:  [15-25] 15 (11/21 1130) BP: (132-217)/(82-131) 132/114 (11/21 1130) SpO2:  [95 %-99 %] 95 % (11/21 1130) Physical Exam: General: Well-appearing, no acute distress, resting in bed Extremities: No new leg swelling bilateral lower extremities Back: No midline spinal tenderness to C, T, L-spine.  Mild lumbar paraspinal tenderness to palpation.  Laboratory: Most recent CBC Lab Results  Component Value Date   WBC 4.2 03/31/2023   HGB 9.5 (L) 03/31/2023   HCT 29.5 (L) 03/31/2023   MCV 88.3 03/31/2023   PLT 169 03/31/2023   Most recent BMP    Latest Ref Rng & Units 04/02/2023    5:41 AM  BMP  Glucose 70 - 99 mg/dL 70   BUN 6 - 20 mg/dL 68   Creatinine 4.00 - 1.24 mg/dL 86.76   Sodium 195 -  145 mmol/L 126   Potassium 3.5 - 5.1 mmol/L 5.0   Chloride 98 - 111 mmol/L 92   CO2 22 - 32 mmol/L 18   Calcium 8.9 - 10.3 mg/dL 8.8      Hadas Jessop, DO 04/02/2023, 12:44 PM  PGY-1, [facility] FPTS Intern pager: [phone], text pages welcome Secure chat group [facility] Teaching Service

## 2023-04-02 NOTE — Assessment & Plan Note (Addendum)
Severe, resistant, uncontrolled. Restarted medications pt was discharged on during last hospitalization. - Minoxidil 5mg  BID, Nifedipine 30mg  ER every day - Continue Carvedilol 25mg  BID, Hydralazine 100mg  TID, Losartan 100mg  every day

## 2023-04-02 NOTE — Anesthesia Preprocedure Evaluation (Signed)
Anesthesia Evaluation  Patient identified by MRN, date of birth, ID band Patient awake    Reviewed: Allergy & Precautions, H&P , NPO status , Patient's Chart, lab work & pertinent test results  Airway Mallampati: II   Neck ROM: full    Dental   Pulmonary Current Smoker and Patient abstained from smoking.   breath sounds clear to auscultation       Cardiovascular hypertension, +CHF   Rhythm:regular Rate:Normal  1. Left ventricular ejection fraction, by estimation, is 55 to 60%. The  left ventricle has normal function. The left ventricle has no regional  wall motion abnormalities. There is mild concentric left ventricular  hypertrophy. Left ventricular diastolic  parameters are consistent with Grade II diastolic dysfunction  (pseudonormalization). Elevated left atrial pressure.   2. Right ventricular systolic function is normal. The right ventricular  size is normal. There is normal pulmonary artery systolic pressure. The  estimated right ventricular systolic pressure is 25.7 mmHg.   3. Left atrial size was moderately dilated.   4. Moderate pericardial effusion. The pericardial effusion is  circumferential. There is no evidence of cardiac tamponade.   5. The mitral valve is normal in structure. Trivial mitral valve  regurgitation. No evidence of mitral stenosis.   6. The aortic valve is tricuspid. Aortic valve regurgitation is not  visualized. No aortic stenosis is present.   7. The inferior vena cava is normal in size with greater than 50%  respiratory variability, suggesting right atrial pressure of 3 mmHg.     Neuro/Psych  Headaches PSYCHIATRIC DISORDERS  Depression Bipolar Disorder      GI/Hepatic ,GERD  ,,  Endo/Other    Renal/GU ESRF and DialysisRenal disease     Musculoskeletal   Abdominal   Peds  Hematology  (+) Blood dyscrasia, anemia   Anesthesia Other Findings   Reproductive/Obstetrics                               Anesthesia Physical Anesthesia Plan  ASA: 3  Anesthesia Plan: MAC   Post-op Pain Management:    Induction: Intravenous  PONV Risk Score and Plan: 0 and Propofol infusion, Midazolam and Treatment may vary due to age or medical condition  Airway Management Planned: Nasal Cannula  Additional Equipment:   Intra-op Plan:   Post-operative Plan:   Informed Consent: I have reviewed the patients History and Physical, chart, labs and discussed the procedure including the risks, benefits and alternatives for the proposed anesthesia with the patient or authorized representative who has indicated his/her understanding and acceptance.     Dental advisory given  Plan Discussed with: CRNA, Anesthesiologist and Surgeon  Anesthesia Plan Comments:         Anesthesia Quick Evaluation

## 2023-04-02 NOTE — Assessment & Plan Note (Addendum)
Incidentally noticed on initial echo. Repeat echo effusion remained stable. Suspected pericardial effusion is secondary to volume overload after missing 2 sessions HD. ACS work-up for chest pain unremarkable.  - Cardiology consulted initially, recs appreciated, signed off at this time - repeat echo with unchanged moderate pericardial effusion

## 2023-04-02 NOTE — Anesthesia Postprocedure Evaluation (Signed)
Anesthesia Post Note  Patient: [name]  Procedure(s) Performed: TRANSESOPHAGEAL ECHOCARDIOGRAM     Patient location during evaluation: PACU Anesthesia Type: MAC Level of consciousness: awake and alert Pain management: pain level controlled Vital Signs Assessment: post-procedure vital signs reviewed and stable Respiratory status: spontaneous breathing, nonlabored ventilation, respiratory function stable and patient connected to nasal cannula oxygen Cardiovascular status: stable and blood pressure returned to baseline Postop Assessment: no apparent nausea or vomiting Anesthetic complications: no   No notable events documented.  Last Vitals:  Vitals:   04/02/23 1128 04/02/23 1130  BP:  (!) 132/114  Pulse:  78  Resp:  15  Temp: 36.8 C   SpO2:  95%    Last Pain:  Vitals:   04/02/23 1230  TempSrc:   PainSc: Asleep                 [place]

## 2023-04-02 NOTE — Assessment & Plan Note (Addendum)
Right shoulder pain, states that it started after his fever broke.  Feels like a muscle.  No chest pain. Could be secondary to muscle strain vs referred from [facility]. 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 Declined imaging. Not complaining of shoulder pain today.  - Tizanidine 2 mg at bedtime - Tylenol 1000 mg every 6 hours as needed - K-pad - Lidocaine patch - continue to monitor closely

## 2023-04-02 NOTE — CV Procedure (Addendum)
   Transesophageal Echocardiogram  Indications:Bacteremia, end-stage renal disease  Time out performed  During this procedure the patient was administered propofol under anesthesiology supervision to achieve and maintain moderate sedation.  The patient's heart rate, blood pressure, and oxygen saturation are monitored continuously during the procedure.   Findings:  Left Ventricle: LVH, normal EF 65%   Mitral Valve: Normal, mild mitral regurgitation, no vegetation  Aortic Valve: Trileaflet, no vegetation  Tricuspid Valve: Normal, trace TR  Left Atrium: Normal, no left atrial appendage thrombus  Right Atrium: Normal  Intraatrial septum: Normal, no shunt by color-flow Doppler  Bubble Contrast Study: Not performed  Moderate pericardial effusion 1.1 cm, no signs of cardiac tamponade  Impression: No vegetations  Donato Schultz, MD

## 2023-04-02 NOTE — Progress Notes (Signed)
Subjective:  Seen in room.   Objective Vital signs in last 24 hours: Vitals:   04/02/23 1105 04/02/23 1115 04/02/23 1128 04/02/23 1130  BP: (!) 167/103 (!) 164/106  (!) 132/114  Pulse: 83 81  78  Resp: 18 (!) 25  15  Temp:   98.3 F (36.8 C)   TempSrc:   Temporal   SpO2: 99% 97%  95%  Weight:      Height:       Weight change:   Physical Exam: General: Alert pleasant hard of hearing, reads lips Heart: RRR no MRG Lungs: CTA bilaterally nonlabored breathing Abdomen: NABS, soft NTND Extremities: no LE edema, bilat ankle joints not red or swollen Dialysis Access: RIJ TDC removed   Renal-related home meds: - norvasc 10 - phoslo 1 ac - coreg 25 bid - diltiazem 300 every day - hydralazine 100 tid - losartan 100 qd   OP HD: TTS NW  4h  400/1.5   79.5kg  2/2 bath  RIJ TDC   Heparin none - last OP HD 11/15, post wt 83.6kg (4kg over) - coming off 3- 6 kg over the last 3 wks - usually good compliance, doesn't sign off early very often - rocaltrol 1.25 mcg - mircera 150 mcg IV q 2, last 11/05, due 11/19 - venofer ?     Assessment/ Plan: Fever/sepsi - BC were 2/2+ for corynebacterium striatum. Seen by ID, recommends line holiday x 72 hrs. TDC removed 11/19 by [facility]. They have him on schedule for replacement [facility] tomorrow. NPO after MN. HTN/ volume - per primary team, WF contacted and his BP regimen should be minoxidil 5 bid, losartan 100 qd, hydralazine 100 tid, procardia 30 every day.  Not taking norvasc/ diltiazem. Is on coreg 25 bid here as well.  ESRD -HD TTS. Next HD Friday after [facility] placement.  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 Darbe 150 mcg sq weekly ordered here weekly on Tuesdays.  Metabolic bone disease -corrected calcium over 10 hold vitamin D on dialysis, phosphorus 10.0 uses PhosLo as binder  Vinson Moselle  MD  [facility] 04/02/2023, 2:38 PM  Recent Labs  Lab 03/29/23 0213 03/31/23 0843 04/02/23 0541  HGB 10.1* 9.5*  --   ALBUMIN  --   2.3* 3.2*  CALCIUM 9.4 7.3* 8.8*  PHOS  --  9.5* 10.6*  CREATININE 9.47* 14.00* 15.50*  K 3.9 3.6 5.0    Inpatient medications:  calcium acetate  667 mg Oral TID WC   carvedilol  25 mg Oral BID WC   Chlorhexidine Gluconate Cloth  6 each Topical Q0600   darbepoetin (ARANESP) injection - NON-DIALYSIS  150 mcg Subcutaneous Q Tue-1800   divalproex  500 mg Oral QHS   heparin  5,000 Units Subcutaneous Q8H   hydrALAZINE  100 mg Oral Q8H   lidocaine  1 patch Transdermal Q24H   losartan  100 mg Oral Daily   minoxidil  5 mg Oral BID   NIFEdipine  30 mg Oral Daily   pantoprazole  40 mg Oral Daily   vancomycin variable dose per unstable renal function (pharmacist dosing)   Does not apply See admin instructions     acetaminophen **OR** acetaminophen, camphor-menthol, famotidine, mouth rinse, tiZANidine

## 2023-04-02 NOTE — Progress Notes (Signed)
  Echocardiogram Echocardiogram Transesophageal has been performed.  Milda Smart 04/02/2023, 11:38 AM

## 2023-04-02 NOTE — Consult Note (Signed)
[facility] Liaison Consult Note    04/02/2023  [name] 07/11/1985 [id number]   Primary Care Provider:  [name], DO with [facility], this provider is listed to provide the community transition of care follow up and TOC calls  *[facility] [[facility]I] remote coverage review for active patient admitted to [facility], procedures Extreme high risk score with 4 ED visit and 3 admissions in the past 6 months  Insurance: Medicare ACO REACH  Patient is currently active with [facility] for care coordination services.  Patient has been engaged by a Geologist, engineering.  The community based plan of care has focused on community resource support.  Patient is currently for procedures for ongoing medical management.  Patient will receive a post hospital call and will be evaluated for assessments and disease process education.  Reviewed electronic medical record from community encounters for ongoing needs for post hospital support care needs.  Patient with limited social support follow up noted, ongoing support for housing noted.  Plan: Continue to follow for any additional community care coordination needs for post hospital/community needs. Will notify VBCI LCSW of admission and any new follow up for transition to community.   Of note, [facility] services does not replace or interfere with any services that are needed or arranged by inpatient [facility] care management team.   Charlesetta Shanks, RN, BSN, CCM [place]  [facility], [facility] Liaison Direct Dial: [phone] or secure chat Email: [email or web address]

## 2023-04-02 NOTE — Assessment & Plan Note (Addendum)
Resolved, thought to be in setting of infected TDC. Bcx positive for Corynebacterium striatum. TDC removed yesterday, plan to replace tomorrow. - infectious disease consulted, appreciate recommendations - nephrology consulted, appreciate recommendations - Vancomycin per pharmacy - monitor Bcx for sensitivities to vancomycin, daptomycin, linezolid - TEE unremarkable - Tylenol prn

## 2023-04-02 NOTE — Plan of Care (Signed)
  Problem: Activity: Goal: Risk for activity intolerance will decrease Outcome: Progressing   Problem: Elimination: Goal: Will not experience complications related to bowel motility Outcome: Progressing   Problem: Pain Management: Goal: General experience of comfort will improve Outcome: Not Progressing

## 2023-04-03 ENCOUNTER — Encounter (HOSPITAL_COMMUNITY): Payer: Self-pay | Admitting: Cardiology

## 2023-04-03 ENCOUNTER — Inpatient Hospital Stay (HOSPITAL_COMMUNITY)
Admission: EM | Disposition: A | Discharge status: Home / Self Care | Payer: Self-pay | Source: Home / Self Care | Attending: Family Medicine

## 2023-04-03 DIAGNOSIS — M25511 Pain in right shoulder: Secondary | ICD-10-CM | POA: Diagnosis not present

## 2023-04-03 DIAGNOSIS — Z992 Dependence on renal dialysis: Secondary | ICD-10-CM | POA: Diagnosis not present

## 2023-04-03 DIAGNOSIS — R7881 Bacteremia: Secondary | ICD-10-CM | POA: Diagnosis not present

## 2023-04-03 DIAGNOSIS — N186 End stage renal disease: Secondary | ICD-10-CM | POA: Diagnosis not present

## 2023-04-03 DIAGNOSIS — A419 Sepsis, unspecified organism: Secondary | ICD-10-CM | POA: Diagnosis not present

## 2023-04-03 HISTORY — PX: DIALYSIS/PERMA CATHETER INSERTION: CATH118288

## 2023-04-03 LAB — RENAL FUNCTION PANEL
Albumin: 3.4 g/dL — ABNORMAL LOW (ref 3.5–5.0)
Anion gap: 18 — ABNORMAL HIGH (ref 5–15)
BUN: 82 mg/dL — ABNORMAL HIGH (ref 6–20)
CO2: 17 mmol/L — ABNORMAL LOW (ref 22–32)
Calcium: 9.1 mg/dL (ref 8.9–10.3)
Chloride: 92 mmol/L — ABNORMAL LOW (ref 98–111)
Creatinine, Ser: 18.5 mg/dL — ABNORMAL HIGH (ref 0.61–1.24)
GFR, Estimated: 3 mL/min — ABNORMAL LOW (ref >60)
Glucose, Bld: 79 mg/dL (ref 70–99)
Phosphorus: 12.2 mg/dL — ABNORMAL HIGH (ref 2.5–4.6)
Potassium: 5.5 mmol/L — ABNORMAL HIGH (ref 3.5–5.1)
Sodium: 127 mmol/L — ABNORMAL LOW (ref 135–145)

## 2023-04-03 LAB — MISC LABCORP TEST (SEND OUT): Labcorp test code: 182808

## 2023-04-03 LAB — GLUCOSE, CAPILLARY: Glucose-Capillary: 90 mg/dL (ref 70–99)

## 2023-04-03 SURGERY — DIALYSIS/PERMA CATHETER INSERTION
Anesthesia: LOCAL

## 2023-04-03 MED ORDER — LIDOCAINE-EPINEPHRINE 1 %-1:100000 IJ SOLN
INTRAMUSCULAR | Status: DC | PRN
  Administered 2023-04-03: 20 mL

## 2023-04-03 MED ORDER — FENTANYL CITRATE (PF) 100 MCG/2ML IJ SOLN
INTRAMUSCULAR | Status: DC | PRN
  Administered 2023-04-03 (×2): 50 ug via INTRAVENOUS

## 2023-04-03 MED ORDER — LIDOCAINE-EPINEPHRINE 1 %-1:100000 IJ SOLN
INTRAMUSCULAR | Status: AC
  Filled 2023-04-03: qty 1

## 2023-04-03 MED ORDER — VANCOMYCIN HCL 750 MG/150ML IV SOLN: 750.0000 mg | INTRAVENOUS | Status: DC

## 2023-04-03 MED ORDER — SODIUM ZIRCONIUM CYCLOSILICATE 10 G PO PACK: 10.0000 g | PACK | Freq: Once | ORAL | Status: DC

## 2023-04-03 MED ORDER — MIDAZOLAM HCL 2 MG/2ML IJ SOLN
INTRAMUSCULAR | Status: AC
Start: 2023-04-03 — End: ?
  Filled 2023-04-03: qty 2

## 2023-04-03 MED ORDER — VANCOMYCIN HCL 750 MG/150ML IV SOLN: 750.0000 mg | Freq: Once | INTRAVENOUS | Status: DC

## 2023-04-03 MED ORDER — MIDAZOLAM HCL 2 MG/2ML IJ SOLN
INTRAMUSCULAR | Status: DC | PRN
  Administered 2023-04-03: 2 mg via INTRAVENOUS
  Administered 2023-04-03: 1 mg via INTRAVENOUS

## 2023-04-03 MED ORDER — MIDAZOLAM HCL 2 MG/2ML IJ SOLN
INTRAMUSCULAR | Status: AC
  Filled 2023-04-03: qty 2

## 2023-04-03 MED ORDER — FENTANYL CITRATE (PF) 100 MCG/2ML IJ SOLN
INTRAMUSCULAR | Status: AC
  Filled 2023-04-03: qty 2

## 2023-04-03 MED ORDER — LOPERAMIDE HCL 2 MG PO CAPS
2.0000 mg | ORAL_CAPSULE | Freq: Once | ORAL | Status: AC
  Administered 2023-04-03: 2 mg via ORAL
  Filled 2023-04-03: qty 1

## 2023-04-03 MED ORDER — HEPARIN SODIUM (PORCINE) 1000 UNIT/ML IJ SOLN
INTRAMUSCULAR | Status: AC
  Filled 2023-04-03: qty 10

## 2023-04-03 MED ORDER — HEPARIN (PORCINE) IN NACL 1000-0.9 UT/500ML-% IV SOLN
INTRAVENOUS | Status: DC | PRN
  Administered 2023-04-03: 500 mL

## 2023-04-03 SURGICAL SUPPLY — 4 items
CATH PALINDROME-P 19CM W/VT (CATHETERS) IMPLANT
KIT MICROPUNCTURE NIT STIFF (SHEATH) IMPLANT
SHEATH PROBE COVER 6X72 (BAG) IMPLANT
TRAY PV CATH (CUSTOM PROCEDURE TRAY) ×1 IMPLANT

## 2023-04-03 NOTE — Assessment & Plan Note (Addendum)
Resolved, thought to be in setting of infected TDC. Bcx positive for Corynebacterium striatum.  Plan to replace [facility] today and then have HD. - infectious disease consulted, appreciate recommendations - nephrology consulted, appreciate recommendations - Continue Vancomycin per pharmacy - monitor Bcx for sensitivities to daptomycin, linezolid - TEE unremarkable - Tylenol prn

## 2023-04-03 NOTE — Assessment & Plan Note (Addendum)
Severe, resistant, uncontrolled. Restarted medications pt was discharged on during last hospitalization. - Minoxidil 5mg  BID, Nifedipine 30mg  ER every day - Continue Carvedilol 25mg  BID, Hydralazine 100mg  TID, Losartan 100mg  every day

## 2023-04-03 NOTE — Progress Notes (Signed)
Contacted by nephrologist regarding a 2nd shift appt for out-pt HD tomorrow. Contacted [facility] and advised that pt's normal days/time is TTS 11:45 arrival for 12:00 chair time. Clinic staff confirm pt can treat tomorrow at normal time. This info was provided to nephrologist who updated attending staff. Plan is for pt to d/c tomorrow am in time to get to out-pt HD clinic for normal treatment. [facility] made aware of this info/plan as well. Spoke to pt via phone to make him aware of possible early d/c in the am in order to get to out-pt HD clinic tomorrow for treatment. Pt agreeable and states he has transportation .   Olivia Canter Renal Navigator [phone]

## 2023-04-03 NOTE — Assessment & Plan Note (Signed)
Nephrology on board.  TTS schedule.  Last HD 11/19. To get HD today after [facility] placement.

## 2023-04-03 NOTE — Assessment & Plan Note (Addendum)
Incidentally noticed on initial echo. Repeat echo effusion remained stable. Suspected pericardial effusion is secondary to volume overload after missing 2 sessions HD. ACS work-up for chest pain unremarkable.  - Cardiology consulted initially, recs appreciated, signed off at this time - repeat echo with unchanged moderate pericardial effusion

## 2023-04-03 NOTE — Consult Note (Signed)
Pharmacy Antibiotic Note  [name] is a 37 y.o. male admitted on 03/27/2023 with sepsis due to bacteremia from Corynebacterium striatum. Pharmacy has been consulted for vancomycin dosing.  PMH: ESRD on HD (Tu, Th, Sa), Alport Syndrome, HTN, deafness & bipolar disorder. Patient was previously admitted 9/30-10/15 for Enterobacter cloacae bacteremia.   Last HD session and HD catheter was removed on 11/19.  Random vancomycin level 36 on 11/19 @ 0843, (drawn before HD) - supratherapeutic. No further vancomycin given after last HD session. TDC being placed today, 11/22, and plans for patient to go to HD later today. Afebrile, WBC wnl.   Plan: Resume vancomycin 750 mg IV with HD today, 11/22, and continue until 04/14/23 (2 weeks from HD cath removal) F/u HD schedule to schedule future vancomycin doses Monitor culture results and susceptibilities Monitor vancomycin levels and clinical signs of improvement  Height: 5\' 8"  (172.7 cm) Weight: 81 kg (178 lb 9.2 oz) IBW/kg (Calculated) : 68.4  Temp (24hrs), Avg:98.2 F (36.8 C), Min:97.9 F (36.6 C), Max:98.5 F (36.9 C)  Recent Labs  Lab 03/27/23 1828 03/27/23 1838 03/28/23 0415 03/28/23 1459 03/29/23 0213 03/31/23 0843 04/02/23 0541 04/03/23 0739  WBC 4.9  --  6.2 4.8 3.7* 4.2  --   --   CREATININE 13.35* 14.10* 15.59* 16.87* 9.47* 14.00* 15.50* 18.50*  LATICACIDVEN  --  0.8  --   --   --   --   --   --   VANCORANDOM  --   --   --   --   --  36  --   --     Estimated Creatinine Clearance: 5.4 mL/min (A) (by C-G formula based on SCr of 18.5 mg/dL (H)).    Allergies  Allergen Reactions   Zestril [Lisinopril] Swelling   Nsaids Other (See Comments)    "Kidney problems "   Risperdal [Risperidone] Other (See Comments)    Unknown reaction     Antimicrobials this admission: Vancomycin 11/15 >> 11/16 Cefepime 11/15 >> 11/16 Metronidazole 11/15 >> 11/17 Augmentin 11/17 >> 11/18  Dose adjustments this  admission: N/A  Microbiology results: 11/15 BCx: Corynebacterium striatum 2/2 11/15 Wound Cx: ngtd 11/15 BCID: negative 11/15 Resp panel: negative 11/16 MRSA PCR: pending  Thank you for allowing pharmacy to be a part of this patient's care.  Enos Fling, PharmD PGY-1 Acute Care Pharmacy Resident 04/03/2023 1:20 PM

## 2023-04-03 NOTE — Progress Notes (Signed)
PHARMACY CONSULT NOTE FOR:  OUTPATIENT  PARENTERAL ANTIBIOTIC THERAPY (OPAT)  Indication: Corynebacterium striatum bacteremia 2/2 TDC Regimen: Vancomycin 750 mg with HD TTS End date: 04/11/23  No formal OPAT will be done. Patient will receive antibiotics with hemodialysis. Nephrology aware.    Thank you for allowing pharmacy to be a part of this patient's care.  Enos Fling, PharmD PGY-1 Acute Care Pharmacy Resident 04/03/2023 2:16 PM

## 2023-04-03 NOTE — Progress Notes (Signed)
[facility]  Date of Admission:  03/27/2023   Total days of inpatient antibiotics 7  Principal Problem:   Sepsis (HCC) Active Problems:   Volume overload  ESRD (end stage renal disease) (HCC)   Hypertension   Moderate Pericardial effusion  HFmrEF  Chest pain   Blood in stool   Right shoulder pain   Bacteremia associated with intravascular line (HCC)          Assessment: 37 YM admitted with;   #Corynebacterium striatum bacteremia 2/2 TDC(right chest)? #ESRD on HD  #Right shoulder pain -Patient presented after passing out during dialysis with temp of 103.2.  Blood cultures grew corynebacterium striatum. -aVF site is not seem to be actively infected.  Patient reports some right shoulder pain that started after fever broke.  He has full range of motion, shoulder was not tender on exam.  Will hold off on imaging at this point as no concern for infection at shoulder site. -TTE and TEE without vegetation -Line pulled on 11/19, blood cx cleared prior to line being pulled on 11/19. 1121 blood Cx NG. Recommendations:  -Continue vancomycin with HD for 2 weeks from line removal EOT 12/2 for line infection -Repeat blood Cx after line removal on 11/22 remain negative   #Moderate pericardial effusion - Cardiology consulted - TEE showed moderate pericardial effusion 1/1 cm, no sing of tamponade.    #Hypertension - Management per primary  -ID will sign off. F/U with ID on 12/16  Microbiology:   Antibiotics: Vancomycin 11/15- Metronidazole 11/5-11/17 Cefeime 11/17      Cultures: Blood 11/15 2/2 corynebacterium striatum 11/19 l ng 11/21 ng SUBJECTIVE: Resting in bed. Talking on the phne Interval: Afebrile overnight  Review of Systems: Review of Systems  All other systems reviewed and are negative.    Scheduled Meds:  calcium acetate  667 mg Oral TID WC   carvedilol  25 mg Oral BID WC   Chlorhexidine Gluconate Cloth  6 each  Topical Q0600   Chlorhexidine Gluconate Cloth  6 each Topical Q0600   darbepoetin (ARANESP) injection - NON-DIALYSIS  150 mcg Subcutaneous Q Tue-1800   divalproex  500 mg Oral QHS   heparin  5,000 Units Subcutaneous Q8H   hydrALAZINE  100 mg Oral Q8H   lidocaine  1 patch Transdermal Q24H   losartan  100 mg Oral Daily   minoxidil  5 mg Oral BID   NIFEdipine  30 mg Oral Daily   pantoprazole  40 mg Oral Daily   sodium zirconium cyclosilicate  10 g Oral Once   vancomycin variable dose per unstable renal function (pharmacist dosing)   Does not apply See admin instructions   Continuous Infusions: PRN Meds:.acetaminophen **OR** acetaminophen, camphor-menthol, famotidine, mouth rinse, tiZANidine Allergies  Allergen Reactions   Zestril [Lisinopril] Swelling   Nsaids Other (See Comments)    "Kidney problems "   Risperdal [Risperidone] Other (See Comments)    Unknown reaction     OBJECTIVE: Vitals:   04/03/23 0500 04/03/23 0827 04/03/23 1122 04/03/23 1207  BP: (!) 179/105 (!) 202/113 (!) 149/103   Pulse: 87     Resp:  17    Temp: 97.9 F (36.6 C) 98.5 F (36.9 C)    TempSrc: Axillary Oral    SpO2: 97% 94%  98%  Weight:      Height:       Body mass index is 27.15 kg/m.  Physical Exam Constitutional:  General: He is not in acute distress.    Appearance: He is normal weight. He is not toxic-appearing.  HENT:     Head: Normocephalic and atraumatic.     Right Ear: External ear normal.     Left Ear: External ear normal.     Nose: No congestion or rhinorrhea.     Mouth/Throat:     Mouth: Mucous membranes are moist.     Pharynx: Oropharynx is clear.  Eyes:     Extraocular Movements: Extraocular movements intact.     Conjunctiva/sclera: Conjunctivae normal.     Pupils: Pupils are equal, round, and reactive to light.  Cardiovascular:     Rate and Rhythm: Normal rate and regular rhythm.     Heart sounds: No murmur heard.    No friction rub. No gallop.  Pulmonary:      Effort: Pulmonary effort is normal.     Breath sounds: Normal breath sounds.  Abdominal:     General: Abdomen is flat. Bowel sounds are normal.     Palpations: Abdomen is soft.  Musculoskeletal:        General: No swelling. Normal range of motion.     Cervical back: Normal range of motion and neck supple.  Skin:    General: Skin is warm and dry.  Neurological:     General: No focal deficit present.     Mental Status: He is oriented to person, place, and time.  Psychiatric:        Mood and Affect: Mood normal.       Lab Results Lab Results  Component Value Date   WBC 4.2 03/31/2023   HGB 9.5 (L) 03/31/2023   HCT 29.5 (L) 03/31/2023   MCV 88.3 03/31/2023   PLT 169 03/31/2023    Lab Results  Component Value Date   CREATININE 18.50 (H) 04/03/2023   BUN 82 (H) 04/03/2023   NA 127 (L) 04/03/2023   K 5.5 (H) 04/03/2023   CL 92 (L) 04/03/2023   CO2 17 (L) 04/03/2023    Lab Results  Component Value Date   ALT 16 03/27/2023   AST 25 03/27/2023   ALKPHOS 69 03/27/2023   BILITOT 0.7 03/27/2023        Danelle Earthly, MD [facility] 04/03/2023, 1:13 PM I have personally spent 50 minutes involved in face-to-face and non-face-to-face activities for this patient on the day of the visit. Professional time spent includes the following activities: Preparing to see the patient (review of tests), Obtaining and/or reviewing separately obtained history (admission/discharge record), Performing a medically appropriate examination and/or evaluation , Ordering medications/tests/procedures, referring and communicating with other health care professionals, Documenting clinical information in the EMR, Independently interpreting results (not separately reported), Communicating results to the patient/family/caregiver, Counseling and educating the patient/family/caregiver and Care coordination (not separately reported).

## 2023-04-03 NOTE — Plan of Care (Signed)
  Problem: Activity: Goal: Risk for activity intolerance will decrease Outcome: Progressing   

## 2023-04-03 NOTE — Progress Notes (Signed)
Pt for [facility] placement today.    Continue npo.    Discussed with pt that he is scheduled for early afternoon, but could be sooner or later depending on what the day brings.     Doreatha Massed, [facility] 04/03/2023 8:39 AM

## 2023-04-03 NOTE — Discharge Instructions (Addendum)
Dear [name],   Thank you for letting us participate in your care!  We suspect that you had an infection of your dialysis catheter in your chest.  You will continue to receive IV antibiotics at dialysis once you leave the hospital until 04/13/2023. Please attend all of your scheduled dialysis sessions and take your medications at home as prescribed.  POST-HOSPITAL & CARE INSTRUCTIONS We recommend following up with your PCP within 1 week from being discharged from the hospital. We have made an appointment for you at our clinic as listed below.  Please follow-up with your infectious disease doctor as listed below. Please let PCP/Specialists know of any changes in medications that were made which you will be able to see in the medications section of this packet.  DOCTOR'S APPOINTMENTS & FOLLOW UP Future Appointments  Date Time Provider Department Center  04/16/2023  8:45 AM Ivery Quale, MD [facility]  04/27/2023 10:00 AM Danelle Earthly, MD [facility]  05/04/2023 10:30 AM Randa Spike, Kelton Pillar, LCSW [facility] None     Thank you for choosing [facility]! Take care and be well!  Family Medicine Teaching Service Inpatient Team [place]  [facility]  [address] [place], [place] [address] [phone]

## 2023-04-03 NOTE — Op Note (Signed)
DATE OF SERVICE: 04/03/2023  PATIENT:  [name]  37 y.o. male  PRE-OPERATIVE DIAGNOSIS:  ESRD  POST-OPERATIVE DIAGNOSIS:  Same  PROCEDURE:   Right internal jugular tunneled dialysis catheter (19cm) placed with ultrasound and fluoroscopic guidance  SURGEON:  Surgeons and Role:    * Leonie Douglas, MD - Primary  ASSISTANT: none  ANESTHESIA:   local and IV sedation  EBL: minimal  BLOOD ADMINISTERED:none  DRAINS: none   LOCAL MEDICATIONS USED:  NONE  SPECIMEN:  none  COUNTS: confirmed correct.  TOURNIQUET:  none  PATIENT DISPOSITION:  PACU - hemodynamically stable.   Delay start of Pharmacological VTE agent (>24hrs) due to surgical blood loss or risk of bleeding: no  INDICATION FOR PROCEDURE: [name] is a 37 y.o. male with ESRD in need of permanent dialysis access. After careful discussion of risks, benefits, and alternatives the patient was offered [facility]. The patient understood and wished to proceed.  OPERATIVE FINDINGS: successful placement of RIJ TDC.  DESCRIPTION OF PROCEDURE: After identification of the patient in the pre-operative holding area, the patient was transferred to the operating room. The patient was positioned supine on the operating room table. Anesthesia was induced. The right neck and chest were prepped and draped in standard fashion. A surgical pause was performed confirming correct patient, procedure, and operative location.  Using ultrasound guidance the right internal jugular vein was accessed with micropuncture technique.  Through the micropuncture sheath a floppy J-wire was advanced into the superior vena cava.  A small incision was made around the skin access point.  The access point was serially dilated under direct fluoroscopic guidance.  A peel-away sheath was introduced into the superior vena cava under fluoroscopic guidance.  A counterincision was made in the chest under the clavicle.  A 19 cm tunnel dialysis catheter was then  tunneled under the skin, over the clavicle into the incision in the neck.  The tunneling device was removed and the catheter fed through the peel-away sheath into the superior vena cava.  The peel-away sheath was removed and the catheter gently pulled back.  Adequate position was confirmed with x-ray.  The catheter was tested and found to flush and draw back well.  Catheter was heparin locked.  Caps were applied.  Catheter was sutured to the skin.  The neck incision was closed with 4-0 Monocryl.  Upon completion of the case instrument and sharps counts were confirmed correct. The patient was transferred to the PACU in good condition. I was present for all portions of the procedure.  [name]. Edward Antu, MD [facility] Phone Number: [phone] 04/03/2023 12:57 PM

## 2023-04-03 NOTE — Progress Notes (Signed)
Daily Progress Note Intern Pager: [phone]  Patient name: [name] Medical record number: [id number] Date of birth: 1986/04/29 Age: 37 y.o. Gender: male  Primary Care Provider: [name], DO Consultants: nephrology, cardiology, infectious disease  Code Status: Full   Pt Overview and Major Events to Date:  11/15 - admitted  11/19 [facility] removed  Assessment and Plan:  37 y.o. male admitted for sepsis of unknown etiology. Patient additionally found to have worsened moderate pericardial effusion, currently undergoing evaluation. 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   Pending TDC placement today then HD Assessment & Plan Sepsis (HCC) Resolved, thought to be in setting of infected TDC. Bcx positive for Corynebacterium striatum.  Plan to replace [facility] today and then have HD. - infectious disease consulted, appreciate recommendations - nephrology consulted, appreciate recommendations - Continue Vancomycin per pharmacy - monitor Bcx for sensitivities to daptomycin, linezolid - TEE unremarkable - Tylenol prn Moderate Pericardial effusion  HFmrEF  Chest pain Incidentally noticed on initial echo. Repeat echo effusion remained stable. Suspected pericardial effusion is secondary to volume overload after missing 2 sessions HD. ACS work-up for chest pain unremarkable.  - Cardiology consulted initially, recs appreciated, signed off at this time - repeat echo with unchanged moderate pericardial effusion Hypertension Severe, resistant, uncontrolled. Restarted medications pt was discharged on during last hospitalization. - Minoxidil 5mg  BID, Nifedipine 30mg  ER every day - Continue Carvedilol 25mg  BID, Hydralazine 100mg  TID, Losartan 100mg  every day Volume overload  ESRD (end stage renal disease) [facility]) Nephrology on board.  TTS schedule.  Last HD 11/19. To get HD today after [facility] placement. Right shoulder pain Right shoulder pain, states that  it started after his fever broke.  Feels like a muscle.  No chest pain. Could be secondary to muscle strain vs referred from [facility]. 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 Declined imaging.  - Tizanidine 2 mg at bedtime - Tylenol 1000 mg every 6 hours as needed - K-pad - Lidocaine patch - continue to monitor closely   Chronic and Stable Problems:  Alport syndrome - Bilateral sensorineural hearing loss Anemia - stable, Hgb 10.9, baseline in 7s Secondary hyperparathyroidism Bipolar 1 Disorder - no meds GERD - holding home protonix for now Migraines - adding back home Depakote 500 mg at bedtime   FEN/GI: NPO PPx: heparin Dispo:Home pending clinical improvement   Subjective:  No acute events overnight.  States he is having trouble sleeping here because of the bed and is ready to go home.  Objective: Temp:  [97.9 F (36.6 C)-98.5 F (36.9 C)] 98.5 F (36.9 C) (11/22 0827) Pulse Rate:  [87-88] 87 (11/22 0500) Resp:  [17] 17 (11/22 0827) BP: (149-202)/(97-113) 149/103 (11/22 1122) SpO2:  [94 %-99 %] 94 % (11/22 0827) Physical Exam: General: No acute distress, well-appearing Cardiovascular: Regular rate and rhythm, no murmurs Respiratory: CTAB, no increased work of breathing on room air  Laboratory: Most recent CBC Lab Results  Component Value Date   WBC 4.2 03/31/2023   HGB 9.5 (L) 03/31/2023   HCT 29.5 (L) 03/31/2023   MCV 88.3 03/31/2023   PLT 169 03/31/2023   Most recent BMP    Latest Ref Rng & Units 04/03/2023    7:39 AM  BMP  Glucose 70 - 99 mg/dL 79   BUN 6 - 20 mg/dL 82   Creatinine 0.10 - 1.24 mg/dL 27.25   Sodium 366 - 440 mmol/L 127   Potassium 3.5 - 5.1  mmol/L 5.5   Chloride 98 - 111 mmol/L 92   CO2 22 - 32 mmol/L 17   Calcium 8.9 - 10.3 mg/dL 9.1    Edward Stlaurent, DO 04/03/2023, 11:53 AM  PGY-1, [place] [facility] FPTS Intern pager: [phone], text pages welcome Secure chat group [facility] Teaching  Service

## 2023-04-03 NOTE — Assessment & Plan Note (Addendum)
Right shoulder pain, states that it started after his fever broke.  Feels like a muscle.  No chest pain. Could be secondary to muscle strain vs referred from [facility]. 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 Declined imaging.  - Tizanidine 2 mg at bedtime - Tylenol 1000 mg every 6 hours as needed - K-pad - Lidocaine patch - continue to monitor closely

## 2023-04-03 NOTE — Progress Notes (Signed)
Subjective:  Seen in room. No c/o's.   Objective Vital signs in last 24 hours: Vitals:   04/03/23 0500 04/03/23 0827 04/03/23 1122 04/03/23 1207  BP: (!) 179/105 (!) 202/113 (!) 149/103   Pulse: 87     Resp:  17    Temp: 97.9 F (36.6 C) 98.5 F (36.9 C)    TempSrc: Axillary Oral    SpO2: 97% 94%  98%  Weight:      Height:       Weight change:   Physical Exam: General: Alert pleasant hard of hearing, reads lips Heart: RRR no MRG Lungs: CTA bilaterally nonlabored breathing Abdomen: NABS, soft NTND Extremities: no LE edema, bilat ankle joints not red or swollen Dialysis Access: RIJ TDC removed   Renal-related home meds: - norvasc 10 - phoslo 1 ac - coreg 25 bid - diltiazem 300 every day - hydralazine 100 tid - losartan 100 qd   OP HD: TTS NW  4h  400/1.5   79.5kg  2/2 bath  RIJ TDC   Heparin none - last OP HD 11/15, post wt 83.6kg (4kg over) - coming off 3- 6 kg over the last 3 wks - usually good compliance, doesn't sign off early very often - rocaltrol 1.25 mcg - mircera 150 mcg IV q 2, last 11/05, due 11/19 - venofer ?     Assessment/ Plan: Fever/sepsis - BC were 2/2+ for corynebacterium striatum. Seen by ID, recommends line holiday x 72 hrs. TDC removed 11/19 by [facility] and replaced today, 11/22.  HTN/ volume - per primary team, WF contacted and his BP regimen should be minoxidil 5 bid, losartan 100 qd, hydralazine 100 tid, procardia 30 every day.  Not taking norvasc/ diltiazem. Is on coreg 25 bid here as well.  ESRD -HD TTS. Next HD today or tonight off schedule. Will try to set up outpatient HD for him tomorrow so we don't have to dialyze him here.  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 Darbe 150 mcg sq weekly ordered here weekly on Tuesdays.  Metabolic bone disease -corrected calcium over 10 hold vitamin D on dialysis, phosphorus 10.0 uses PhosLo as binder  Vinson Moselle  MD  [facility] 04/03/2023, 3:01 PM  Recent Labs  Lab  03/29/23 0213 03/31/23 0843 04/02/23 0541 04/03/23 0739  HGB 10.1* 9.5*  --   --   ALBUMIN  --  2.3* 3.2* 3.4*  CALCIUM 9.4 7.3* 8.8* 9.1  PHOS  --  9.5* 10.6* 12.2*  CREATININE 9.47* 14.00* 15.50* 18.50*  K 3.9 3.6 5.0 5.5*    Inpatient medications:  calcium acetate  667 mg Oral TID WC   carvedilol  25 mg Oral BID WC   Chlorhexidine Gluconate Cloth  6 each Topical Q0600   Chlorhexidine Gluconate Cloth  6 each Topical Q0600   darbepoetin (ARANESP) injection - NON-DIALYSIS  150 mcg Subcutaneous Q Tue-1800   divalproex  500 mg Oral QHS   heparin  5,000 Units Subcutaneous Q8H   hydrALAZINE  100 mg Oral Q8H   lidocaine  1 patch Transdermal Q24H   losartan  100 mg Oral Daily   minoxidil  5 mg Oral BID   NIFEdipine  30 mg Oral Daily   pantoprazole  40 mg Oral Daily   sodium zirconium cyclosilicate  10 g Oral Once   vancomycin variable dose per unstable renal function (pharmacist dosing)   Does not apply See admin instructions    vancomycin      acetaminophen **  OR** acetaminophen, camphor-menthol, famotidine, mouth rinse, tiZANidine

## 2023-04-03 NOTE — Progress Notes (Signed)
Per order, hold antihypertensive medication pre dialysis. Providers at bedside, blood pressure 202/113 providers stated to hold morning hypertensive medication until after dialysis session. Just wanted to clarify order.

## 2023-04-04 ENCOUNTER — Other Ambulatory Visit (HOSPITAL_COMMUNITY): Payer: Self-pay

## 2023-04-04 DIAGNOSIS — R7881 Bacteremia: Secondary | ICD-10-CM | POA: Diagnosis not present

## 2023-04-04 LAB — CULTURE, BLOOD (ROUTINE X 2): Special Requests: ADEQUATE

## 2023-04-04 LAB — RENAL FUNCTION PANEL
Albumin: 3.3 g/dL — ABNORMAL LOW (ref 3.5–5.0)
Anion gap: 20 — ABNORMAL HIGH (ref 5–15)
BUN: 92 mg/dL — ABNORMAL HIGH (ref 6–20)
CO2: 15 mmol/L — ABNORMAL LOW (ref 22–32)
Calcium: 9.1 mg/dL (ref 8.9–10.3)
Chloride: 91 mmol/L — ABNORMAL LOW (ref 98–111)
Creatinine, Ser: 20.75 mg/dL — ABNORMAL HIGH (ref 0.61–1.24)
GFR, Estimated: 3 mL/min — ABNORMAL LOW (ref >60)
Glucose, Bld: 79 mg/dL (ref 70–99)
Phosphorus: >30 mg/dL — ABNORMAL HIGH (ref 2.5–4.6)
Potassium: 5.8 mmol/L — ABNORMAL HIGH (ref 3.5–5.1)
Sodium: 126 mmol/L — ABNORMAL LOW (ref 135–145)

## 2023-04-04 LAB — GLUCOSE, CAPILLARY: Glucose-Capillary: 79 mg/dL (ref 70–99)

## 2023-04-04 MED ORDER — VANCOMYCIN HCL 750 MG/150ML IV SOLN
750.0000 mg | INTRAVENOUS | Status: DC
Start: 2023-04-06 — End: 2023-04-04

## 2023-04-04 MED ORDER — VANCOMYCIN HCL 750 MG/150ML IV SOLN
750.0000 mg | Freq: Once | INTRAVENOUS | Status: AC
Start: 2023-04-04 — End: 2023-04-04
  Administered 2023-04-04: 750 mg via INTRAVENOUS
  Filled 2023-04-04: qty 150

## 2023-04-04 MED ORDER — MINOXIDIL 10 MG PO TABS
5.0000 mg | ORAL_TABLET | Freq: Two times a day (BID) | ORAL | 0 refills | Status: DC
  Filled 2023-04-04: qty 30, 30d supply, fill #0

## 2023-04-04 MED ORDER — NIFEDIPINE ER 30 MG PO TB24: 30.0000 mg | ORAL_TABLET | Freq: Every day | ORAL | 0 refills | Status: DC

## 2023-04-04 MED ORDER — TIZANIDINE HCL 2 MG PO TABS
2.0000 mg | ORAL_TABLET | Freq: Four times a day (QID) | ORAL | 0 refills | Status: DC | PRN
  Filled 2023-04-04: qty 30, 8d supply, fill #0

## 2023-04-04 MED ORDER — NEPRO/CARBSTEADY PO LIQD: 237.0000 mL | ORAL | Status: DC | PRN

## 2023-04-04 MED ORDER — LOSARTAN POTASSIUM 100 MG PO TABS
100.0000 mg | ORAL_TABLET | Freq: Every day | ORAL | 0 refills | Status: DC
  Filled 2023-04-04: qty 30, 30d supply, fill #0

## 2023-04-04 MED ORDER — LIDOCAINE-PRILOCAINE 2.5-2.5 % EX CREA: 1.0000 | TOPICAL_CREAM | CUTANEOUS | Status: DC | PRN

## 2023-04-04 MED ORDER — LOSARTAN POTASSIUM 100 MG PO TABS: 100.0000 mg | ORAL_TABLET | Freq: Every day | ORAL | 0 refills | Status: DC

## 2023-04-04 MED ORDER — OXYCODONE HCL 5 MG PO TABS
5.0000 mg | ORAL_TABLET | Freq: Once | ORAL | Status: AC
  Administered 2023-04-04: 5 mg via ORAL
  Filled 2023-04-04 (×2): qty 1

## 2023-04-04 MED ORDER — CARVEDILOL 25 MG PO TABS: 25.0000 mg | ORAL_TABLET | Freq: Two times a day (BID) | ORAL | 0 refills | Status: DC

## 2023-04-04 MED ORDER — HEPARIN SODIUM (PORCINE) 1000 UNIT/ML IJ SOLN
INTRAMUSCULAR | Status: AC
  Filled 2023-04-04: qty 2

## 2023-04-04 MED ORDER — VANCOMYCIN HCL 750 MG/150ML IV SOLN: 750.0000 mg | INTRAVENOUS | Status: AC

## 2023-04-04 MED ORDER — PENTAFLUOROPROP-TETRAFLUOROETH EX AERO: 1.0000 | INHALATION_SPRAY | CUTANEOUS | Status: DC | PRN

## 2023-04-04 MED ORDER — CARVEDILOL 25 MG PO TABS
25.0000 mg | ORAL_TABLET | Freq: Two times a day (BID) | ORAL | 0 refills | Status: DC
  Filled 2023-04-04: qty 60, 30d supply, fill #0

## 2023-04-04 MED ORDER — PANTOPRAZOLE SODIUM 40 MG PO TBEC: 40.0000 mg | DELAYED_RELEASE_TABLET | Freq: Every day | ORAL | 0 refills | Status: DC

## 2023-04-04 MED ORDER — VANCOMYCIN HCL 750 MG/150ML IV SOLN: 750.0000 mg | INTRAVENOUS | Status: DC

## 2023-04-04 MED ORDER — HYDRALAZINE HCL 100 MG PO TABS
100.0000 mg | ORAL_TABLET | Freq: Three times a day (TID) | ORAL | 0 refills | Status: DC
  Filled 2023-04-04: qty 90, 30d supply, fill #0

## 2023-04-04 MED ORDER — ANTICOAGULANT SODIUM CITRATE 4% (200MG/5ML) IV SOLN: 5.0000 mL | Status: DC | PRN

## 2023-04-04 MED ORDER — MINOXIDIL 2.5 MG PO TABS: 5.0000 mg | ORAL_TABLET | Freq: Two times a day (BID) | ORAL | 0 refills | Status: DC

## 2023-04-04 MED ORDER — FAMOTIDINE 20 MG PO TABS
20.0000 mg | ORAL_TABLET | Freq: Every day | ORAL | 0 refills | Status: DC | PRN
  Filled 2023-04-04: qty 25, 25d supply, fill #0

## 2023-04-04 MED ORDER — NIFEDIPINE ER 30 MG PO TB24
30.0000 mg | ORAL_TABLET | Freq: Every day | ORAL | 0 refills | Status: DC
  Filled 2023-04-04: qty 30, 30d supply, fill #0

## 2023-04-04 MED ORDER — LIDOCAINE HCL (PF) 1 % IJ SOLN: 5.0000 mL | INTRAMUSCULAR | Status: DC | PRN

## 2023-04-04 MED ORDER — ALTEPLASE 2 MG IJ SOLR: 2.0000 mg | Freq: Once | INTRAMUSCULAR | Status: DC | PRN

## 2023-04-04 MED ORDER — FAMOTIDINE 20 MG PO TABS: 20.0000 mg | ORAL_TABLET | Freq: Every day | ORAL | 0 refills | Status: DC | PRN

## 2023-04-04 MED ORDER — TIZANIDINE HCL 2 MG PO TABS: 2.0000 mg | ORAL_TABLET | Freq: Four times a day (QID) | ORAL | 0 refills | Status: DC | PRN

## 2023-04-04 MED ORDER — HYDRALAZINE HCL 100 MG PO TABS: 100.0000 mg | ORAL_TABLET | Freq: Three times a day (TID) | ORAL | 0 refills | Status: DC

## 2023-04-04 MED ORDER — HEPARIN SODIUM (PORCINE) 1000 UNIT/ML DIALYSIS
1000.0000 [IU] | INTRAMUSCULAR | Status: DC | PRN
  Administered 2023-04-04: 1000 [IU]
  Filled 2023-04-04: qty 1

## 2023-04-04 MED ORDER — DIVALPROEX SODIUM ER 500 MG PO TB24: 500.0000 mg | ORAL_TABLET | Freq: Every day | ORAL | 0 refills | Status: AC

## 2023-04-04 NOTE — Progress Notes (Addendum)
Patient very upset since the beginning of shift stating he was suppose to go to dialysis but has not been yet. RN has called the hemo unit several times and no answer. Charge RN also called with no success. Patient was updated, but still very upset. Kept calling on the call bell for updates. RN called dialysis unit again at 1227 04/04/23 and was finally able to get in contact with someone. They stated that they are backed up at the moment and did not have an estimated time on when they would be getting him. Patient callled family member several times and RN spoke with her to explain the situation. Patient started to complain of numbness in his mouth and left leg. No complaints of shortness of breath, does have non pitting edema to bilateral legs, lungs clear. Vitals taken, see flowsheet. MD notified and stated they would be up to see patient. MD at bedside with RN and approved family member Glenice Laine on the phone to listen in.

## 2023-04-04 NOTE — Progress Notes (Signed)
Subjective:  Seen in room after HD  Objective Vital signs in last 24 hours: Vitals:   04/04/23 1000 04/04/23 1015 04/04/23 1030 04/04/23 1045  BP: (!) 164/95  (!) 165/108   Pulse: 97 96 98 97  Resp: (!) 22 (!) 31 18 15   Temp:      TempSrc:      SpO2: 96% 95% 94% 96%  Weight:      Height:       Weight change:   Physical Exam: General: Alert pleasant, hard of hearing, reads lips Heart: RRR no MRG Lungs: CTA bilaterally nonlabored breathing Abdomen: NABS, soft NTND Extremities: no LE edema Dialysis Access: RIJ TDC removed   Renal-related home meds: - norvasc 10 - phoslo 1 ac - coreg 25 bid - diltiazem 300 every day - hydralazine 100 tid - losartan 100 qd   OP HD: TTS NW  4h  400/1.5   79.5kg  2/2 bath  RIJ TDC   Heparin none - last OP HD 11/15, post wt 83.6kg (4kg over) - coming off 3- 6 kg over the last 3 wks - usually good compliance, doesn't sign off early very often - rocaltrol 1.25 mcg - mircera 150 mcg IV q 2, last 11/05, due 11/19 - venofer ?     Assessment/ Plan: Fever/sepsis - BC were 2/2+ for corynebacterium striatum. Seen by ID, recommends line holiday x 72 hrs. TDC removed 11/19 by [facility] and replaced today, 11/22. For dc today after dialysis.  HTN/ volume - per primary team, WF contacted and his BP regimen should be minoxidil 5 bid, losartan 100 qd, hydralazine 100 tid, procardia 30 every day.  Not taking norvasc/ diltiazem. Is on coreg 25 bid here as well.  ESRD -HD TTS. HD was postponed from Friday to today due to emergencies overnight. On HD now. He will not need to go to his OP unit for HD today. Next OP dialysis will be on Monday due to the holiday schedule.  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 Darbe 150 mcg sq weekly ordered here weekly on Tuesdays.  Metabolic bone disease -corrected calcium over 10 hold vitamin D on dialysis, phosphorus 10.0 uses PhosLo as binder Dispo - for dc home today after dialysis.  I explained  to him that his next HD, just for this week, is on Monday (not Tuesday) because of the holiday this week. He understands.   Vinson Moselle  MD  [facility] 04/04/2023, 10:56 AM  Recent Labs  Lab 03/29/23 0213 03/31/23 0843 04/02/23 0541 04/03/23 0739 04/04/23 0705  HGB 10.1* 9.5*  --   --   --   ALBUMIN  --  2.3*   < > 3.4* 3.3*  CALCIUM 9.4 7.3*   < > 9.1 9.1  PHOS  --  9.5*   < > 12.2* >30.0*  CREATININE 9.47* 14.00*   < > 18.50* 20.75*  K 3.9 3.6   < > 5.5* 5.8*   < > = values in this interval not displayed.    Inpatient medications:  calcium acetate  667 mg Oral TID WC   carvedilol  25 mg Oral BID WC   Chlorhexidine Gluconate Cloth  6 each Topical Q0600   Chlorhexidine Gluconate Cloth  6 each Topical Q0600   darbepoetin (ARANESP) injection - NON-DIALYSIS  150 mcg Subcutaneous Q Tue-1800   divalproex  500 mg Oral QHS   heparin  5,000 Units Subcutaneous Q8H   heparin sodium (porcine)  hydrALAZINE  100 mg Oral Q8H   lidocaine  1 patch Transdermal Q24H   losartan  100 mg Oral Daily   minoxidil  5 mg Oral BID   NIFEdipine  30 mg Oral Daily   pantoprazole  40 mg Oral Daily   sodium zirconium cyclosilicate  10 g Oral Once   vancomycin variable dose per unstable renal function (pharmacist dosing)   Does not apply See admin instructions    anticoagulant sodium citrate     vancomycin     [START ON 04/06/2023] vancomycin      acetaminophen **OR** acetaminophen, alteplase, anticoagulant sodium citrate, camphor-menthol, famotidine, feeding supplement (NEPRO CARB STEADY), heparin, heparin sodium (porcine), lidocaine (PF), lidocaine-prilocaine, mouth rinse, pentafluoroprop-tetrafluoroeth, tiZANidine

## 2023-04-04 NOTE — Progress Notes (Signed)
Pt discharged with personal belongings and TOC medications.

## 2023-04-04 NOTE — Discharge Summary (Signed)
Family Medicine Teaching [facility] Discharge Summary  Patient name: [name] Medical record number: [id number] Date of birth: 1986-04-01 Age: 37 y.o. Gender: male Date of Admission: 03/27/2023  Date of Discharge: 04/04/2023 Admitting Physician: Para March, DO  Primary Care Provider: [name], DO Consultants: Nephrology, ID, vascular surgery  Indication for Hospitalization: Sepsis  Discharge Diagnoses/Problem List:  Principal Problem for Admission: Sepsis Other Problems addressed during stay:  Principal Problem:   Sepsis [facility]) Active Problems:   Moderate Pericardial effusion  HFmrEF  Chest pain   Volume overload  ESRD (end stage renal disease) (HCC)   Hypertension   Blood in stool   Right shoulder pain   Bacteremia associated with intravascular line [facility])    Brief Hospital Course:  RUSHAWN [name] is a 37 y.o.male with a history of Alport syndrome with deafness and ESRD on dialysis MWF, recent AV fistula infection, and bipolar disorder who was admitted to the [facility] Medicine Teaching Service at [facility] for sepsis. His hospital course is detailed below:  Sepsis Fever, tachycardia, initial concern for AV fistula infection.  Started on vancomycin, cefepime, Flagyl initially. Vascular surgery consulted and reported low concern for AV fistula infection. Initially narrowed abx to Augmentin due to suspected contaminant of gram positive rods on blood cultures. Blood cultures then grew Cornyebacterium Striatum, suspected source [facility] as pt developed R shoulder pain and rigors during dialysis on 11/18. ID consulted. Vancomycin restarted and augmentin discontinued. TDC was removed on 11/19 and replaced 11/22 without complication.  Blood cultures drawn after TDC removal showed no growth at 2 days. last dialysis was 04/04/23 prior to discharge. Plan to continue vancomycin outpatient at dialysis until 04/13/23, f/u w/ ID on 12/16.   Moderate pericardial  effusion Incidentally found when ruling out endocarditis.  Cardiology consulted.  Likely secondary to volume overload from missing dialysis sessions.  Repeat echo showed no progression of effusion. Hypertensive so low suspicion for tamponade. Cardiology recommended another echo outpatient to assess for effusion resolving.   Hypertension Consistently elevated above systolic 160 /diastolic 100 in setting of ESRD, hypovolemia, missed dialysis, medication confusion after multiple admissions. Pt was discharged from [facility] last month on the following regimen that we continued: minoxidil, nifedipine, carvedilol, hydralazine, and losartan.   ESRD on dialysis Missed several sessions prior to admission.  Last session HD during hospitalization on 04/04/2023 with plans for next HD session outpatient on 04/06/2023  Diarrhea Watery, no blood, 3 x /day, noted during end of hospitalization. No leukocytosis, fever, abdominal pain.  Treated with loperamide.  Other chronic conditions were medically managed with home medications and formulary alternatives as necessary (Alport syndrome, anemia, secondary hyperparathyroidism, bipolar 1 disorder, GERD, migraine)  PCP Follow-up Recommendations: Repeat echo outpatient for pericardial effusion F/u intermittent blood in stool noted by pt during hospitalization, hgb remained stable F/u diarrhea Ensure pt f/u with ID   Disposition: Home  Discharge Condition: Stable  Discharge Exam:  Vitals:   04/04/23 1050 04/04/23 1100  BP: (!) 165/108 (!) 162/85  Pulse: 94 89  Resp: 19 16  Temp:    SpO2: 96% 91%     Significant Procedures:  11/19-TDC removed, thought to be infectious source 11/22-TDC replaced  Significant Labs and Imaging:  No results for input(s): "WBC", "HGB", "HCT", "PLT" in the last 48 hours. Recent Labs  Lab 04/03/23 0739 04/04/23 0705  NA 127* 126*  K 5.5* 5.8*  CL 92* 91*  CO2 17* 15*  GLUCOSE 79 79  BUN 82* 92*  CREATININE 18.50*  20.75*  CALCIUM 9.1 9.1  PHOS 12.2* >30.0*  ALBUMIN 3.4* 3.3*     Results/Tests Pending at Time of Discharge: none  Discharge Medications:  Allergies as of 04/04/2023       Reactions   Zestril [lisinopril] Swelling   Nsaids Other (See Comments)   "Kidney problems "   Risperdal [risperidone] Other (See Comments)   Unknown reaction         Medication List     STOP taking these medications    amLODipine 10 MG tablet Commonly known as: NORVASC   diltiazem 300 MG 24 hr capsule Commonly known as: TIAZAC       TAKE these medications    calcium acetate 667 MG capsule Commonly known as: PHOSLO Take 1 capsule (667 mg total) by mouth 3 (three) times daily with meals.   carvedilol 25 MG tablet Commonly known as: COREG Take 1 tablet (25 mg total) by mouth 2 (two) times daily with a meal. What changed: how to take this   divalproex 500 MG 24 hr tablet Commonly known as: DEPAKOTE ER Take 1 tablet (500 mg total) by mouth daily.   famotidine 20 MG tablet Commonly known as: PEPCID Take 1 tablet (20 mg total) by mouth daily as needed for indigestion or heartburn.   hydrALAZINE 100 MG tablet Commonly known as: APRESOLINE Take 1 tablet (100 mg total) by mouth 3 (three) times daily.   losartan 100 MG tablet Commonly known as: COZAAR Take 1 tablet (100 mg total) by mouth daily.   minoxidil 10 MG tablet Commonly known as: LONITEN Take 0.5 tablets (5 mg total) by mouth 2 (two) times daily.   NIFEdipine 30 MG 24 hr tablet Commonly known as: ADALAT CC Take 1 tablet (30 mg total) by mouth daily.   pantoprazole 40 MG tablet Commonly known as: PROTONIX Take 1 tablet (40 mg total) by mouth daily. What changed: when to take this   tiZANidine 2 MG tablet Commonly known as: ZANAFLEX Take 1 tablet (2 mg total) by mouth every 6 (six) hours as needed for muscle spasms.   vancomycin 750 MG/150ML Soln Commonly known as: VANCOREADY Inject 150 mLs (750 mg total) into the vein  every Monday, Wednesday, and Friday with hemodialysis for 7 days. Start taking on: April 06, 2023        Discharge Instructions: Please refer to Patient Instructions section of EMR for full details.  Patient was counseled important signs and symptoms that should prompt return to medical care, changes in medications, dietary instructions, activity restrictions, and follow up appointments.   Follow-Up Appointments:  Follow-up Information     [name], DO Follow up.   Specialty: Family Medicine Contact information: [address] [place] [address] [phone]                 [name], DO 04/04/2023, 1:10 PM PGY-3, [facility] Family [facility]

## 2023-04-05 LAB — CULTURE, BLOOD (ROUTINE X 2)
Culture: NO GROWTH
Culture: NO GROWTH
Special Requests: ADEQUATE
Special Requests: ADEQUATE

## 2023-04-05 NOTE — Discharge Planning (Signed)
[facility] Patient Discharge Orders- [facility] CLINIC: [facility]  Patient's name: [name] Admit/DC Dates: 03/27/2023 - 04/04/2023  Discharge Diagnoses: Sepsis   Pericardial effusion  Aranesp: Given: Yes   Date and amount of last dose: on   Last Hgb: 9.5 PRBC's Given: no Date/# of units: N/A ESA dose for discharge: mircera 150 mcg IV q 2 weeks  IV Iron dose at discharge: none  Heparin change: no  EDW Change: No New EDW: **call if weights seem very high/low, weights may not have been accurate here  [place]ath Change: no  Access intervention/Change: Yes Details: S/p line holiday, new TDC placed 04/03/23  Hectorol/Calcitriol change: no  Discharge Labs: Calcium 9.1 Phosphorus 12.2 Albumin 3.3 K+ 5.8  IV Antibiotics: YES Details: Vancomycin 750mg  IV q HD- last day 04/13/23  On Coumadin?: no Last INR: Next INR: Managed By:   OTHER/APPTS/LAB ORDERS: Please see antibiotic order above    D/C Meds to be reconciled by nurse after every discharge.  Completed By: Rogers Blocker, PA-C 04/05/2023, 11:20 AM  [facility] Pager: [phone]    Reviewed by: MD:______ RN_______

## 2023-04-06 ENCOUNTER — Telehealth: Payer: Self-pay | Admitting: Vascular Surgery

## 2023-04-06 ENCOUNTER — Telehealth: Payer: Self-pay

## 2023-04-06 ENCOUNTER — Encounter (HOSPITAL_COMMUNITY): Payer: Self-pay | Admitting: Vascular Surgery

## 2023-04-06 NOTE — Progress Notes (Signed)
Late Nore Entry- Nov. 25, 2024  Pt was d/c on Saturday. Contacted [facility] to advise clinic of pt's d/c date and that pt should resume care today (due to holiday schedule).   Olivia Canter Renal Navigator [phone]

## 2023-04-06 NOTE — Transitions of Care (Post Inpatient/ED Visit) (Signed)
04/06/2023  Name: [name] MRN: [id number] DOB: [birth year]  Today's TOC FU Call Status: Today's TOC FU Call Status:: Successful TOC FU Call Completed TOC FU Call Complete Date: 04/06/23 Patient's Name and Date of Birth confirmed.  Transition Care Management Follow-up Telephone Call Date of Discharge: 04/04/23 Discharge Facility: [facility]) Type of Discharge: Inpatient Admission Primary Inpatient Discharge Diagnosis:: Sepsis How have you been since you were released from the hospital?: Better Any questions or concerns?: Yes Patient Questions/Concerns:: Patient hving difficulty sleeping due to his sutures pulling his skin and sometimes bleeding. Patient Questions/Concerns Addressed: Notified Provider of Patient Questions/Concerns (Message sent to Dr. Lenell Antu with patients concerns)  Items Reviewed: Did you receive and understand the discharge instructions provided?: Yes Medications obtained,verified, and reconciled?: Yes (Medications Reviewed) Any new allergies since your discharge?: No Dietary orders reviewed?: No Do you have support at home?: Yes  Medications Reviewed Today: Medications Reviewed Today     Reviewed by [name], RN (Case Manager) on 04/06/23 at [phone]  Med List Status: <None>   Medication Order Taking? Sig Documenting Provider Last Dose Status Informant  calcium acetate (PHOSLO) 667 MG capsule [id number] Yes Take 1 capsule (667 mg total) by mouth 3 (three) times daily with meals. Vonna Drafts, MD Taking Active Self, Pharmacy Records  carvedilol (COREG) 25 MG tablet [id number] Yes Take 1 tablet (25 mg total) by mouth 2 (two) times daily with a meal. [name], DO Taking Active   divalproex (DEPAKOTE ER) 500 MG 24 hr tablet [id number] Yes Take 1 tablet (500 mg total) by mouth daily. [name], DO Taking Active   famotidine (PEPCID) 20 MG tablet [id number] Yes Take 1 tablet (20 mg total) by mouth daily as needed for indigestion or heartburn. [name], DO Taking Active   hydrALAZINE (APRESOLINE) 100 MG tablet [id number] Yes Take 1 tablet (100 mg total) by mouth 3 (three) times daily. [name], DO Taking Active   losartan (COZAAR) 100 MG tablet [id number] Yes Take 1 tablet (100 mg total) by mouth daily. [name], DO Taking Active   minoxidil (LONITEN) 10 MG tablet [id number] Yes Take 0.5 tablets (5 mg total) by mouth 2 (two) times daily. [name], DO Taking Active   NIFEdipine (ADALAT CC) 30 MG 24 hr tablet [id number] Yes Take 1 tablet (30 mg total) by mouth daily. [name], DO Taking Active   pantoprazole (PROTONIX) 40 MG tablet [id number] No Take 1 tablet (40 mg total) by mouth daily.  Patient not taking: Reported on 04/06/2023   [name], DO Not Taking Active            Med Note Electa Sniff [facility]   Mon Apr 06, 2023  9:58 AM) Patient needs to pick up at pharmacy  tiZANidine (ZANAFLEX) 2 MG tablet [id number] Yes Take 1 tablet (2 mg total) by mouth every 6 (six) hours as needed for muscle spasms. [name], DO Taking Active   vancomycin [facility]) 750 MG/150ML SOLN [id number] Yes Inject 150 mLs (750 mg total) into the vein every Monday, Wednesday, and Friday with hemodialysis for 7 days. [name], DO Taking Active             Home Care and Equipment/Supplies: Were Home Health Services Ordered?: No Any new equipment or medical supplies ordered?: No  Functional Questionnaire: Do you need assistance with bathing/showering or dressing?: No Do you need assistance with meal preparation?: No Do you need assistance with eating?: No Do you have difficulty maintaining continence: No Do  you need assistance with getting out of bed/getting out of a chair/moving?: No Do you have difficulty managing or taking your medications?: No  Follow up appointments reviewed: PCP Follow-up appointment confirmed?: Yes Date of PCP follow-up appointment?: 04/16/23 Follow-up Provider: Dr. Threasa Beards [facility] Follow-up  appointment confirmed?: Yes Date of Specialist follow-up appointment?: 04/27/23 Follow-Up Specialty Provider:: Dr. Thedore Mins (ID) Do you need transportation to your follow-up appointment?: No Do you understand care options if your condition(s) worsen?: Yes-patient verbalized understanding  [name] RN, BSN, CCM RN Care Manager  Transitions of Care  [place] [facility]  [phone]

## 2023-04-07 LAB — CULTURE, BLOOD (ROUTINE X 2)
Culture: NO GROWTH
Culture: NO GROWTH

## 2023-04-08 MED FILL — Lidocaine Inj 1% w/ Epinephrine-1:100000: INTRAMUSCULAR | Qty: 20 | Status: AC

## 2023-04-08 MED FILL — Heparin Sodium (Porcine) Inj 1000 Unit/ML: INTRAMUSCULAR | Qty: 10 | Status: AC

## 2023-04-16 ENCOUNTER — Ambulatory Visit: Payer: Self-pay | Admitting: Family Medicine

## 2023-04-20 ENCOUNTER — Ambulatory Visit (INDEPENDENT_AMBULATORY_CARE_PROVIDER_SITE_OTHER): Payer: Medicare Other | Admitting: Student

## 2023-04-20 ENCOUNTER — Encounter: Payer: Self-pay | Admitting: Student

## 2023-04-20 VITALS — BP 168/105 | HR 92 | Temp 97.6°F | Wt 183.0 lb

## 2023-04-20 DIAGNOSIS — I10 Essential (primary) hypertension: Secondary | ICD-10-CM | POA: Diagnosis not present

## 2023-04-20 DIAGNOSIS — K529 Noninfective gastroenteritis and colitis, unspecified: Secondary | ICD-10-CM | POA: Diagnosis present

## 2023-04-20 NOTE — Assessment & Plan Note (Signed)
Not at goal however patient is currently asymptomatic.  Appears to be patient's normal range.  Able to determine if patient is compliant to his medication given that he is on 5 different hypertensive meds.  Encourage patient to take medication as prescribed and reviewed return precautions with patient who verbalized understanding.  We will likely consider ambulatory blood pressure check with pharmacy team in the near future.

## 2023-04-20 NOTE — Patient Instructions (Addendum)
Great to see you today.  Glad to hear that you are no longer having fevers.  I have added the phone number for the infectious disease specialist to cellulitis hospital.  Please call them to schedule an appointment for them to see you.  [address] [place], [place] [address] [phone]  For your diarrhea I recommend you call your gastroenterologist to be seen as soon as possible.  We will also collect samples of your stool to test your stool.  Please come back tomorrow to be advised on how stool can be collected.

## 2023-04-20 NOTE — Progress Notes (Cosign Needed Addendum)
    SUBJECTIVE:   CHIEF COMPLAINT / HPI: Sepsis follow up  37 year old male with Hx of alport with deafness and ESRD on HD Admitted and found to have bactaremia sepsis Infectious source suspected to be a BP is generally Was treated with IV antibiotics and seen by infectious diease Patient say he received additional antibiotics at HD since discharge He has denied any fevers or chill since hospitalization And is for patient to follow-up with infectious disease after discharge   HTN Chronically have elevated BPs Denies any new headache, shortness of breath, vision changes or LE edema Currently on minoxidil, nifedipine, carvedilol, hydralazine, losartan Check home BPs.  Diarrhea. Reports continued diarrhea Diarrhea has been present prior to hospital admission This has been going on for over a month. 3-4 watery diarrhea described as floating stool a day. No blood in stool or abdominal pain Recent travel and no antibiotics prior to start of diarrhea.  Shoulder nodule Per patient this has been present for over 2-3 years.  Slowly growing no tenderness or discomfort associated with it.  Doesn't remember of any trauma to the shoulder in the past.   PERTINENT  PMH / PSH: Reviewed   OBJECTIVE:   BP (!) 168/105   Pulse 92   Temp 97.6 F (36.4 C)   Wt 183 lb (83 kg)   SpO2 98%   BMI 27.83 kg/m    Physical Exam General: Alert, well appearing, NAD Cardiovascular: RRR, No Murmurs, Normal S2/S2 Respiratory: CTAB, No wheezing or Rales Abdomen: No distension or tenderness Extremities: No edema on extremities   Skin: Non mobile, non tender 5-7cm nodule on right shoulder   ASSESSMENT/PLAN:   Severe uncontrolled hypertension Not at goal however patient is currently asymptomatic.  Appears to be patient's normal range.  Able to determine if patient is compliant to his medication given that he is on 5 different hypertensive meds.  Encourage patient to take medication as prescribed and  reviewed return precautions with patient who verbalized understanding.  We will likely consider ambulatory blood pressure check with pharmacy team in the near future.  Sepsis [facility]) Recently admitted to the hospital for sepsis.  Patient has denied any fevers or chills since hospitalization and endorses completion of his antibiotic course.  Vitals today appear stable other than his chronically elevated BPs. He has an appointment with infectious disease for December 16.  Provided patient with phone number to contact infectious disease should things change.   Chronic diarrhea  Unclear cause of patient's diarrhea however no red flag symptoms such as weight loss, hematochezia or significant abdominal pain.  His abdominal exam was generally unremarkable.  Given that this has been a chronic issue for patient will order labs for stool pathogen panel, ova/ parasite and culture.  Shoulder Nodule Suspect this is most likely a lipoma.  Will contact the [facility] clinic to see if they can be excised here in the clinic.  If so patient will need an appointment to be scheduled.  Note addendum Discussed with Dr. Lum Babe and Dr. Autumn Messing.  Plan will be for patient to see general surgery given patient's medical history and size of the nodule.  Attempted to call patient but unable to reach him and left a generic voicemail.   Jerre Simon, MD [facility]

## 2023-04-20 NOTE — Assessment & Plan Note (Addendum)
Recently admitted to the hospital for sepsis.  Patient has denied any fevers or chills since hospitalization and endorses completion of his antibiotic course.  Vitals today appear stable other than his chronically elevated BPs. He has an appointment with infectious disease for December 16.  Provided patient with phone number to contact infectious disease should things change.

## 2023-04-20 NOTE — Telephone Encounter (Signed)
 Unable to schedule appt.

## 2023-04-21 ENCOUNTER — Other Ambulatory Visit: Payer: Self-pay

## 2023-04-23 ENCOUNTER — Ambulatory Visit: Payer: Medicare Other | Admitting: Family Medicine

## 2023-04-27 ENCOUNTER — Inpatient Hospital Stay: Payer: Self-pay | Admitting: Internal Medicine

## 2023-05-04 ENCOUNTER — Ambulatory Visit: Payer: Self-pay | Admitting: Licensed Clinical Social Worker

## 2023-05-04 NOTE — Patient Outreach (Signed)
  Care Coordination   05/04/2023 Name: [name] MRN: [id number] DOB: 06/09/1985   Care Coordination Outreach Attempts:  An unsuccessful telephone outreach was attempted today to offer the patient information about available complex care management services.  Follow Up Plan:  Additional outreach attempts will be made to offer the patient complex care management information and services.   Encounter Outcome:  No Answer   Care Coordination Interventions:  No, not indicated    Kelton Pillar.Annarae Macnair MSW, LCSW Licensed Visual merchandiser [facility] Care Management [phone]

## 2023-05-08 ENCOUNTER — Inpatient Hospital Stay (HOSPITAL_COMMUNITY)
Admission: EM | Admit: 2023-05-08 | Discharge: 2023-05-14 | DRG: 304 | Disposition: A | Discharge status: Home Health | Payer: Medicare Other | Attending: Family Medicine | Admitting: Family Medicine

## 2023-05-08 ENCOUNTER — Encounter (HOSPITAL_COMMUNITY): Payer: Self-pay | Admitting: Family Medicine

## 2023-05-08 ENCOUNTER — Emergency Department (HOSPITAL_COMMUNITY): Payer: Medicare Other

## 2023-05-08 DIAGNOSIS — Z841 Family history of disorders of kidney and ureter: Secondary | ICD-10-CM | POA: Diagnosis not present

## 2023-05-08 DIAGNOSIS — I3139 Other pericardial effusion (noninflammatory): Secondary | ICD-10-CM | POA: Diagnosis present

## 2023-05-08 DIAGNOSIS — K219 Gastro-esophageal reflux disease without esophagitis: Secondary | ICD-10-CM | POA: Diagnosis present

## 2023-05-08 DIAGNOSIS — M79605 Pain in left leg: Secondary | ICD-10-CM | POA: Diagnosis present

## 2023-05-08 DIAGNOSIS — H547 Unspecified visual loss: Secondary | ICD-10-CM | POA: Diagnosis present

## 2023-05-08 DIAGNOSIS — E875 Hyperkalemia: Secondary | ICD-10-CM | POA: Diagnosis present

## 2023-05-08 DIAGNOSIS — Z79899 Other long term (current) drug therapy: Secondary | ICD-10-CM | POA: Diagnosis not present

## 2023-05-08 DIAGNOSIS — F17210 Nicotine dependence, cigarettes, uncomplicated: Secondary | ICD-10-CM | POA: Diagnosis present

## 2023-05-08 DIAGNOSIS — I132 Hypertensive heart and chronic kidney disease with heart failure and with stage 5 chronic kidney disease, or end stage renal disease: Secondary | ICD-10-CM | POA: Diagnosis present

## 2023-05-08 DIAGNOSIS — Z5982 Transportation insecurity: Secondary | ICD-10-CM

## 2023-05-08 DIAGNOSIS — F319 Bipolar disorder, unspecified: Secondary | ICD-10-CM | POA: Diagnosis present

## 2023-05-08 DIAGNOSIS — E871 Hypo-osmolality and hyponatremia: Secondary | ICD-10-CM | POA: Diagnosis present

## 2023-05-08 DIAGNOSIS — H6691 Otitis media, unspecified, right ear: Secondary | ICD-10-CM | POA: Diagnosis present

## 2023-05-08 DIAGNOSIS — R9389 Abnormal findings on diagnostic imaging of other specified body structures: Secondary | ICD-10-CM | POA: Diagnosis present

## 2023-05-08 DIAGNOSIS — Z8249 Family history of ischemic heart disease and other diseases of the circulatory system: Secondary | ICD-10-CM

## 2023-05-08 DIAGNOSIS — Z833 Family history of diabetes mellitus: Secondary | ICD-10-CM

## 2023-05-08 DIAGNOSIS — Z825 Family history of asthma and other chronic lower respiratory diseases: Secondary | ICD-10-CM

## 2023-05-08 DIAGNOSIS — Z888 Allergy status to other drugs, medicaments and biological substances status: Secondary | ICD-10-CM | POA: Diagnosis not present

## 2023-05-08 DIAGNOSIS — M79604 Pain in right leg: Secondary | ICD-10-CM | POA: Diagnosis present

## 2023-05-08 DIAGNOSIS — Z91148 Patient's other noncompliance with medication regimen for other reason: Secondary | ICD-10-CM

## 2023-05-08 DIAGNOSIS — D631 Anemia in chronic kidney disease: Secondary | ICD-10-CM | POA: Diagnosis present

## 2023-05-08 DIAGNOSIS — G43909 Migraine, unspecified, not intractable, without status migrainosus: Secondary | ICD-10-CM | POA: Diagnosis present

## 2023-05-08 DIAGNOSIS — I5022 Chronic systolic (congestive) heart failure: Secondary | ICD-10-CM | POA: Diagnosis present

## 2023-05-08 DIAGNOSIS — Z823 Family history of stroke: Secondary | ICD-10-CM

## 2023-05-08 DIAGNOSIS — I1A0 Resistant hypertension: Secondary | ICD-10-CM | POA: Diagnosis present

## 2023-05-08 DIAGNOSIS — I10 Essential (primary) hypertension: Secondary | ICD-10-CM | POA: Diagnosis present

## 2023-05-08 DIAGNOSIS — N186 End stage renal disease: Secondary | ICD-10-CM | POA: Diagnosis present

## 2023-05-08 DIAGNOSIS — N2581 Secondary hyperparathyroidism of renal origin: Secondary | ICD-10-CM | POA: Diagnosis present

## 2023-05-08 DIAGNOSIS — Z992 Dependence on renal dialysis: Secondary | ICD-10-CM | POA: Diagnosis not present

## 2023-05-08 DIAGNOSIS — J9601 Acute respiratory failure with hypoxia: Secondary | ICD-10-CM | POA: Diagnosis present

## 2023-05-08 DIAGNOSIS — I161 Hypertensive emergency: Secondary | ICD-10-CM | POA: Diagnosis present

## 2023-05-08 DIAGNOSIS — R197 Diarrhea, unspecified: Secondary | ICD-10-CM

## 2023-05-08 DIAGNOSIS — I169 Hypertensive crisis, unspecified: Secondary | ICD-10-CM | POA: Diagnosis present

## 2023-05-08 DIAGNOSIS — Z1152 Encounter for screening for COVID-19: Secondary | ICD-10-CM

## 2023-05-08 DIAGNOSIS — Q8781 Alport syndrome: Secondary | ICD-10-CM

## 2023-05-08 DIAGNOSIS — Z822 Family history of deafness and hearing loss: Secondary | ICD-10-CM

## 2023-05-08 DIAGNOSIS — M898X9 Other specified disorders of bone, unspecified site: Secondary | ICD-10-CM | POA: Diagnosis present

## 2023-05-08 DIAGNOSIS — Z91158 Patient's noncompliance with renal dialysis for other reason: Secondary | ICD-10-CM

## 2023-05-08 DIAGNOSIS — D179 Benign lipomatous neoplasm, unspecified: Secondary | ICD-10-CM | POA: Diagnosis present

## 2023-05-08 DIAGNOSIS — R0600 Dyspnea, unspecified: Principal | ICD-10-CM

## 2023-05-08 DIAGNOSIS — H9201 Otalgia, right ear: Secondary | ICD-10-CM | POA: Insufficient documentation

## 2023-05-08 LAB — CBC WITH DIFFERENTIAL/PLATELET
Abs Immature Granulocytes: 0.02 10*3/uL (ref 0.00–0.07)
Basophils Absolute: 0 10*3/uL (ref 0.0–0.1)
Basophils Relative: 0 %
Eosinophils Absolute: 0.1 10*3/uL (ref 0.0–0.5)
Eosinophils Relative: 3 %
HCT: 33.5 % — ABNORMAL LOW (ref 39.0–52.0)
Hemoglobin: 11.1 g/dL — ABNORMAL LOW (ref 13.0–17.0)
Immature Granulocytes: 0 %
Lymphocytes Relative: 16 %
Lymphs Abs: 0.8 10*3/uL (ref 0.7–4.0)
MCH: 27.8 pg (ref 26.0–34.0)
MCHC: 33.1 g/dL (ref 30.0–36.0)
MCV: 84 fL (ref 80.0–100.0)
Monocytes Absolute: 0.4 10*3/uL (ref 0.1–1.0)
Monocytes Relative: 9 %
Neutro Abs: 3.5 10*3/uL (ref 1.7–7.7)
Neutrophils Relative %: 72 %
Platelets: 141 10*3/uL — ABNORMAL LOW (ref 150–400)
RBC: 3.99 MIL/uL — ABNORMAL LOW (ref 4.22–5.81)
RDW: 15.7 % — ABNORMAL HIGH (ref 11.5–15.5)
WBC: 4.9 10*3/uL (ref 4.0–10.5)
nRBC: 0 % (ref 0.0–0.2)

## 2023-05-08 LAB — COMPREHENSIVE METABOLIC PANEL
ALT: 12 U/L (ref 0–44)
AST: 21 U/L (ref 15–41)
Albumin: 3.1 g/dL — ABNORMAL LOW (ref 3.5–5.0)
Alkaline Phosphatase: 88 U/L (ref 38–126)
Anion gap: 25 — ABNORMAL HIGH (ref 5–15)
BUN: 98 mg/dL — ABNORMAL HIGH (ref 6–20)
CO2: 16 mmol/L — ABNORMAL LOW (ref 22–32)
Calcium: 9.2 mg/dL (ref 8.9–10.3)
Chloride: 85 mmol/L — ABNORMAL LOW (ref 98–111)
Creatinine, Ser: 21.01 mg/dL — ABNORMAL HIGH (ref 0.61–1.24)
GFR, Estimated: 3 mL/min — ABNORMAL LOW (ref >60)
Glucose, Bld: 95 mg/dL (ref 70–99)
Potassium: 4.8 mmol/L (ref 3.5–5.1)
Sodium: 126 mmol/L — ABNORMAL LOW (ref 135–145)
Total Bilirubin: 0.7 mg/dL (ref <1.2)
Total Protein: 6.2 g/dL — ABNORMAL LOW (ref 6.5–8.1)

## 2023-05-08 LAB — PHOSPHORUS: Phosphorus: 9.2 mg/dL — ABNORMAL HIGH (ref 2.5–4.6)

## 2023-05-08 LAB — MAGNESIUM: Magnesium: 2.5 mg/dL — ABNORMAL HIGH (ref 1.7–2.4)

## 2023-05-08 LAB — RESP PANEL BY RT-PCR (RSV, FLU A&B, COVID)  RVPGX2
Influenza A by PCR: NEGATIVE
Influenza B by PCR: NEGATIVE
Resp Syncytial Virus by PCR: NEGATIVE
SARS Coronavirus 2 by RT PCR: NEGATIVE

## 2023-05-08 LAB — BRAIN NATRIURETIC PEPTIDE: B Natriuretic Peptide: >4500 pg/mL — ABNORMAL HIGH (ref 0.0–100.0)

## 2023-05-08 LAB — HEPATITIS B SURFACE ANTIGEN: Hepatitis B Surface Ag: NONREACTIVE

## 2023-05-08 LAB — TROPONIN I (HIGH SENSITIVITY)
Troponin I (High Sensitivity): 90 ng/L — ABNORMAL HIGH (ref <18)
Troponin I (High Sensitivity): 91 ng/L — ABNORMAL HIGH (ref <18)

## 2023-05-08 MED ORDER — FENTANYL CITRATE PF 50 MCG/ML IJ SOSY
50.0000 ug | PREFILLED_SYRINGE | Freq: Once | INTRAMUSCULAR | Status: DC
  Filled 2023-05-08 (×2): qty 1

## 2023-05-08 MED ORDER — HEPARIN SODIUM (PORCINE) 5000 UNIT/ML IJ SOLN
5000.0000 [IU] | Freq: Three times a day (TID) | INTRAMUSCULAR | Status: DC
  Administered 2023-05-09 – 2023-05-13 (×8): 5000 [IU] via SUBCUTANEOUS
  Filled 2023-05-08 (×13): qty 1

## 2023-05-08 MED ORDER — LABETALOL HCL 5 MG/ML IV SOLN
20.0000 mg | Freq: Once | INTRAVENOUS | Status: AC
  Administered 2023-05-09: 20 mg via INTRAVENOUS
  Filled 2023-05-08: qty 4

## 2023-05-08 MED ORDER — OXYCODONE HCL 5 MG PO TABS
5.0000 mg | ORAL_TABLET | Freq: Once | ORAL | Status: AC
  Administered 2023-05-08: 5 mg via ORAL
  Filled 2023-05-08: qty 1

## 2023-05-08 MED ORDER — CHLORHEXIDINE GLUCONATE CLOTH 2 % EX PADS
6.0000 | MEDICATED_PAD | Freq: Every day | CUTANEOUS | Status: DC
  Administered 2023-05-12 – 2023-05-13 (×2): 6 via TOPICAL

## 2023-05-08 MED ORDER — ACETAMINOPHEN 500 MG PO TABS
1000.0000 mg | ORAL_TABLET | Freq: Once | ORAL | Status: AC
  Administered 2023-05-08: 1000 mg via ORAL
  Filled 2023-05-08: qty 2

## 2023-05-08 NOTE — ED Provider Notes (Signed)
[place] EMERGENCY DEPARTMENT AT [facility] Provider Note  HPI   [name] is a 37 y.o. male patient with a PMHx of Alport syndrome with deafness and ESRD on dialysis MWF, recent AV fistula infection, and bipolar disorder who is here today with concern for shortness of breath.  Patient had a recent hospital admission a few weeks ago at the end of November 2024, he was seen there for sepsis, concern for AV fistula infection, was getting vancomycin outpatient dialysis until 04-13-23 per the discharge summary.  He was also found to have an incidental moderate pericardial effusion.  Repeat which was not done, he is an ESRD as well as had missed several sessions prior to the hospitalization.  He now has a right upper chest cath which is where his access site is.  Today's Friday, December 27, the patient was last Dialysis Tuesday, December 24, he missed it yesterday due to transportation issues yester today, and was supposed to get it today although they were full.  He is endorsing significant shortness of breath with some chest pain, pain all over his body, fevers chills overall general malaise not feeling well    ROS Negative except as per HPI   Medical Decision Making   Upon presentation, the patient is hypertensive to 210s and 220s systolics.  He is not tachycardic, and mildly tachypneic, he is not in respiratory failure, no overt signs of fluid overload, he is able to answer questions appropriately but has difficulty hard of hearing.  No signs of infection around the new port site.  For this patient, he is a high risk individual, we will get an EKG on him right away, chest x-ray, CBC CMP tropes magnesium phosphorus as well as a BNP.  I suspect volume overload with the hypertension, missed dialysis, I do not think this is flash pulmonary edema, he is not actively having any major shortness of breath right now although he has very mild tachypnea, has had a mild cough as well  could be pneumonia, we will defer antibiotics and blood cultures at this point.  Will obtain urine if patient is able to make some urine, will also add on COVID and flu given his shortness of breath.  Will treat the patient's pain with Tylenol and fentanyl.  I think this patient will probably require admission to the hospital for dialysis.  He is having some chest pain so we will evaluate for ACS with troponins.  Out of this a pulmonary embolism at this point he is not coughing up any blood, no history of DVT or PE no hormone therapy.  No recent travel.  Clinical Course as of 05/08/23 2052  Fri May 08, 2023  1716 Alport syndrome with deafness and ESRD on dialysis MWF, recent AV fistula infection, and bipolar disorder  [JL]    Clinical Course User Index [JL] Gunnar Bulla, MD   Patient difficult to obtain IV access as patient was moving frequently during IV placement.  Ultimately we are able to get a COVID flu is negative, his phosphorus is high at 9.2 magnesium 2.5, scattered metabolic derangement bicarbonate 16 sodium 126 creatinine 21 anion gap 25, his BNP is elevated at 4500 his chest x-ray shows fluid overload his ECG shows no new signs of ischemia or peaked T waves.  Troponin is elevated 91.  Have also already spoken to the nephrology team and the medicine team.  Ongoing give a dose of labetalol now to lower his blood pressure, given his  pain medication, Tylenol.  And the patient will be admitted to the family medicine service, he will require dialysis first and then will be going to family medicine service.  He has been on some oxygen, however this is mostly due to comfort as patient did not desaturate at all   1. Dyspnea, unspecified type     @DISPOSITION @  Rx / DC Orders ED Discharge Orders     None        Past Medical History:  Diagnosis Date   Alport syndrome    Anemia    Bipolar 1 disorder (HCC)    CHF (congestive heart failure) (HCC)    Depression    ESRD (end stage renal  disease) on dialysis (HCC)    GERD (gastroesophageal reflux disease)    Headache    Hearing difficulty of left ear    75% hearing   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   Heart murmur    Hypertension    Low blood sugar    Marijuana abuse    Noncompliance    Pericardial effusion 03/28/2023   Tobacco abuse    Past Surgical History:  Procedure Laterality Date   A/V FISTULAGRAM Right 10/27/2022   Procedure: A/V Fistulagram;  Surgeon: Chuck Hint, MD;  Location: [facility] INVASIVE CV LAB;  Service: Cardiovascular;  Laterality: Right;   APPENDECTOMY     AV FISTULA PLACEMENT Left 03/03/2019   Procedure: ARTERIOVENOUS (AV) FISTULA CREATION LEFT ARM;  Surgeon: Maeola Harman, MD;  Location: [facility] OR;  Service: Vascular;  Laterality: Left;   BASCILIC VEIN TRANSPOSITION Right 04/12/2019   Procedure: RIGHT UPPER EXTREMITY BASCILIC VEIN TRANSPOSITION FIRST STAGE FISTULA;  Surgeon: Chuck Hint, MD;  Location: [facility] OR;  Service: Vascular;  Laterality: Right;   BIOPSY  10/14/2021   Procedure: BIOPSY;  Surgeon: Sherrilyn Rist, MD;  Location: [facility] ENDOSCOPY;  Service: Gastroenterology;;   BIOPSY  06/06/2022   Procedure: BIOPSY;  Surgeon: Napoleon Form, MD;  Location: [facility] ENDOSCOPY;  Service: Gastroenterology;;   BIOPSY  11/12/2022   Procedure: BIOPSY;  Surgeon: Lemar Lofty., MD;  Location: [facility] ENDOSCOPY;  Service: Gastroenterology;;   BIOPSY  12/05/2022   Procedure: BIOPSY;  Surgeon: [name], MD;  Location: [facility] ENDOSCOPY;  Service: Gastroenterology;;   COLONOSCOPY WITH PROPOFOL N/A 10/14/2021   Procedure: COLONOSCOPY WITH PROPOFOL;  Surgeon: Sherrilyn Rist, MD;  Location: [facility] ENDOSCOPY;  Service: Gastroenterology;  Laterality: N/A;   DIALYSIS/PERMA CATHETER INSERTION N/A 04/03/2023   Procedure: DIALYSIS/PERMA CATHETER INSERTION;  Surgeon: Leonie Douglas, MD;  Location: [facility] INVASIVE CV LAB;  Service: Cardiovascular;  Laterality: N/A;    ESOPHAGOGASTRODUODENOSCOPY N/A 11/12/2022   Procedure: ESOPHAGOGASTRODUODENOSCOPY (EGD);  Surgeon: Lemar Lofty., MD;  Location: [facility] ENDOSCOPY;  Service: Gastroenterology;  Laterality: N/A;   ESOPHAGOGASTRODUODENOSCOPY N/A 12/05/2022   Procedure: ESOPHAGOGASTRODUODENOSCOPY (EGD);  Surgeon: [name], MD;  Location: [facility] ENDOSCOPY;  Service: Gastroenterology;  Laterality: N/A;   ESOPHAGOGASTRODUODENOSCOPY (EGD) WITH PROPOFOL N/A 06/06/2022   Procedure: ESOPHAGOGASTRODUODENOSCOPY (EGD) WITH PROPOFOL;  Surgeon: Napoleon Form, MD;  Location: [facility] ENDOSCOPY;  Service: Gastroenterology;  Laterality: N/A;   FISTULA SUPERFICIALIZATION Right 11/07/2022   Procedure: PLICATION OF LARGE ANEURYSMS OF RIGHT ARTERIOVENOUS FISTULA WITH INSERTION OF 7mm INTERPOSITIONAL GORTEX GRAFT;  Surgeon: Chuck Hint, MD;  Location: [facility] OR;  Service: Vascular;  Laterality: Right;   FLEXIBLE SIGMOIDOSCOPY N/A 04/14/2022   Procedure: FLEXIBLE SIGMOIDOSCOPY;  Surgeon: Napoleon Form, MD;  Location: [facility]  ENDOSCOPY;  Service: Gastroenterology;  Laterality: N/A;   INSERTION OF DIALYSIS CATHETER N/A 11/07/2022   Procedure: INSERTION OF RIGHT INTERNAL JUGULAR TUNNELED DIALYSIS CATHETER;  Surgeon: Chuck Hint, MD;  Location: [facility] OR;  Service: Vascular;  Laterality: N/A;   LIGATION OF ARTERIOVENOUS  FISTULA Left 03/06/2019   Procedure: LIGATION OF ARTERIOVENOUS  FISTULA;  Surgeon: Cephus Shelling, MD;  Location: [facility] OR;  Service: Vascular;  Laterality: Left;   PERIPHERAL VASCULAR BALLOON ANGIOPLASTY  10/27/2022   Procedure: PERIPHERAL VASCULAR BALLOON ANGIOPLASTY;  Surgeon: Chuck Hint, MD;  Location: [facility] INVASIVE CV LAB;  Service: Cardiovascular;;  rt upper arm fistula   SAVORY DILATION N/A 11/12/2022   Procedure: SAVORY DILATION;  Surgeon: Meridee Score Netty Starring., MD;  Location: [facility] ENDOSCOPY;  Service: Gastroenterology;  Laterality: N/A;   spinal tap     SPINE SURGERY     related to a  spinal infection, unsure of surgery or infection source   TRANSESOPHAGEAL ECHOCARDIOGRAM (CATH LAB) N/A 04/02/2023   Procedure: TRANSESOPHAGEAL ECHOCARDIOGRAM;  Surgeon: Jake Bathe, MD;  Location: [facility] INVASIVE CV LAB;  Service: Cardiovascular;  Laterality: N/A;   WISDOM TOOTH EXTRACTION     Family History  Problem Relation Age of Onset   Hypertension Mother    Kidney failure Mother    Diabetes Mother    Hearing loss Father    Hypertension Sister    Heart disease Maternal Grandmother    Stroke Maternal Grandfather    Asthma Son    Social History   Socioeconomic History   Marital status: Married    Spouse name: Not on file   Number of children: 3   Years of education: Not on file   Highest education level: Not on file  Occupational History   Occupation: HVAC  Tobacco Use   Smoking status: Every Day    Current packs/day: 0.00    Types: Cigarettes    Last attempt to quit: 02/27/2016    Years since quitting: 7.1   Smokeless tobacco: Never   Tobacco comments:    Returned to smoking 2 black and milds per day  Vaping Use   Vaping status: Never Used  Substance and Sexual Activity   Alcohol use: Not Currently    Comment: rare   Drug use: Yes    Types: Marijuana   Sexual activity: Not Currently  Other Topics Concern   Not on file  Social History Narrative   Not on file   Social Drivers of Health   Financial Resource Strain: Not on file  Food Insecurity: No Food Insecurity (04/06/2023)   Hunger Vital Sign    Worried About Running Out of Food in the Last Year: Never true    Ran Out of Food in the Last Year: Never true  Transportation Needs: No Transportation Needs (04/06/2023)   PRAPARE - Administrator, Civil Service (Medical): No    Lack of Transportation (Non-Medical): No  Physical Activity: Inactive (03/24/2023)   Exercise Vital Sign    Days of Exercise per Week: 0 days    Minutes of Exercise per Session: 0 min  Stress: Stress Concern Present  (03/24/2023)   [employer] of Occupational Health - Occupational Stress Questionnaire    Feeling of Stress : Rather much  Social Connections: Unknown (09/20/2021)   Received from [facility]   Social Network    Social Network: Not on file  Intimate Partner Violence: Not At Risk (04/06/2023)   Humiliation, Afraid, Rape, and Kick  questionnaire    Fear of Current or Ex-Partner: No    Emotionally Abused: No    Physically Abused: No    Sexually Abused: No     Physical Exam   Vitals:   05/08/23 1520 05/08/23 1725 05/08/23 1830 05/08/23 1915  BP: (!) 215/157 (!) 215/157  (!) 206/147  Pulse: 88 88  85  Resp: (!) 24 (!) 24  17  Temp: 97.6 F (36.4 C) 97.6 F (36.4 C)    TempSrc: Oral Oral    SpO2: 98% 98% 100% 99%  Weight:   83 kg   Height:   5\' 8"  (1.727 m)     Physical Exam Vitals and nursing note reviewed.  Constitutional:      General: He is not in acute distress.    Appearance: He is well-developed. He is not ill-appearing or toxic-appearing.  HENT:     Head: Normocephalic and atraumatic.     Right Ear: External ear normal.     Left Ear: External ear normal.     Nose: Nose normal.     Mouth/Throat:     Mouth: Mucous membranes are moist.  Eyes:     Extraocular Movements: Extraocular movements intact.     Pupils: Pupils are equal, round, and reactive to light.  Cardiovascular:     Rate and Rhythm: Normal rate.     Pulses: Normal pulses.  Pulmonary:     Effort: No respiratory distress.     Breath sounds: Normal breath sounds. No stridor. No wheezing, rhonchi or rales.     Comments: Mild tachypnea Abdominal:     Palpations: Abdomen is soft.     Tenderness: There is no abdominal tenderness. There is no right CVA tenderness or left CVA tenderness.  Musculoskeletal:        General: Normal range of motion.     Cervical back: Normal range of motion and neck supple.     Comments: No lower extremity edema.  Right upper chest port no signs of infection  around  Skin:    General: Skin is warm and dry.     Capillary Refill: Capillary refill takes less than 2 seconds.  Neurological:     General: No focal deficit present.     Mental Status: [name] and oriented to person, place, and time. Mental status is at baseline.  Psychiatric:     Comments: Patient is moving back and forth, endorsing pain, speaking fast, hard of hearing      Procedures   If procedures were preformed on this patient, they are listed below:  Procedures  The patient was seen, evaluated, and treated in conjunction with the attending physician, who voiced agreement in the care provided.  Note generated using Dragon voice dictation software and may contain dictation errors. Please contact me for any clarification or with any questions.   Electronically signed by:  Osvaldo Shipper, M.D. (PGY-2)    Gunnar Bulla, MD 05/08/23 4098    Pricilla Loveless, MD 05/09/23 1525

## 2023-05-08 NOTE — Assessment & Plan Note (Signed)
ESRD d/t Alport disease.  Last HD Tuesday and missed subsequent appointments due to holidays and transportation issues.  Presents with volume overload and electrolyte derangements.  BNP >4500.  Headache, dyspnea, pulmonary edema, pain likely sequelae of volume overload. -Renal diet w/ 1200 mL fluid restriction -Caution with renally metabolized drugs -SubQ heparin for VTE PPx -AM RFP and Mag -Nephrology consulted by ED, patient going to HD currently, follow up recs

## 2023-05-08 NOTE — ED Notes (Signed)
X-ray at bedside

## 2023-05-08 NOTE — H&P (Cosign Needed Addendum)
Hospital Admission History and Physical Service Pager: [phone]  Patient name: [name] Medical record number: [id number] Date of Birth: 1986/04/27 Age: 37 y.o. Gender: male  Primary Care Provider: [name], DO Consultants: Nephrology Code Status: FULL, which was confirmed with patient at bedside Preferred Emergency Contact: Contact Information     Name Relation Home Work Mobile   Parker,Cynthia Relative [phone]  [phone]      Other Contacts     Name Relation Home Work Mobile   Cleary   [phone]   Glenice Laine Relative   [phone]      Chief Complaint: Dspnea, headache  Assessment and Plan: [name] is a 37 y.o. male presenting with dyspnea and headache found to be profoundly hypertensive and volume overloaded.  Most likely etiology for this patient's presentation is hypertensive emergency secondary to uncontrolled hypertension and volume overload in the setting of ESRD having missed dialysis x 2 days. Also with concern for recent medication nonadherence.  Patient will undergo HD imminently and will reevaluate his symptoms and blood pressure subsequently.  Expect improvement in headache, pain, dyspnea, and hypertension once fluid is drawn off.  Restarted his oral blood pressure meds, but he may may require IV antihypertensives, with goal to decrease SBP 25% in first 24 hours.  Will follow-up his previously noted pericardial effusion with echo and discuss diarrhea in the morning once EGD is completed. Assessment & Plan Hypertensive emergency Has resistant HTN and baseline SBP likely 160s-180s, not compliant with outpatient antihypertensives except hydralazine.  Suspect vision changes are in the setting of hypertensive emergency.  HTN likely exacerbated by volume overload.  Will need to use caution in bringing down BP, limit to 25% in first 24 hours. -Admit to FMTS, attending Dr. Jennette Kettle, Progressive unit -Continuous cardiac  monitoring -PT/OT eval and treat -Restart home hydralazine 100 mg TID, losartan 100 mg daily, minoxidil 5 mg BID, nifedipine XL 30 mg daily, carvedilol 25 mg BID -Goal reduction in SBP of 25% from ~220/150 in first 24 hours -Reassess BP following HD, will add PRN labetalol if elevated above goal ESRD needing dialysis  Volume overload ESRD d/t Alport disease.  Last HD Tuesday and missed subsequent appointments due to holidays and transportation issues.  Presents with volume overload and electrolyte derangements.  BNP >4500.  Headache, dyspnea, pulmonary edema, pain likely sequelae of volume overload. -Renal diet w/ 1200 mL fluid restriction -Caution with renally metabolized drugs -SubQ heparin for VTE PPx -AM RFP and Mag -Nephrology consulted by ED, patient going to HD currently, follow up recs Moderate pericardial effusion  Chest pain Flat troponin 90>91 and BNP >4500 in setting of volume overload.  Cardiomegaly seen on CXR.  EKG without ischemic changes and satting well on room air.  Noted effusion w/o tamponade on 11/21 during last admission.  Did not follow up with recommended outpatient echo.  Does appear to have JVD, difficult to evaluate heart sounds on exam due to agitation.  Not hypotensive.  Do not favor tamponade, but will obtain repeat TEE to follow for resolution. -TTE -Consult Cardiology based on findings Diarrhea Difficult to elicit in history due to hearing impairment and headache on admission, but may be having 3-4 loose stools daily for the past few weeks noted on clinic visit 12/9 and admission 11/15.  Consider C. diff workup given risk in setting of HD and recent vancomycin course. 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 male with a pertinent PMH of Alport syndrome, ESRD on HD, BPD 1, bilateral sensorineural hearing loss, resistant HTN, anemia of CKD, and secondary hyperparathyroidism presenting with dyspnea, headache, and chest pain.  Of note, patient was hospitalized FMTS 11/15-11/23 for sepsis suspected secondary to [facility], which was treated with vancomycin.  Moderate sized pericardial effusion was noted that admission, but patient did not follow up with outpatient echo.  Was continued on daily vancomycin until 04/13/2023 after discharge, which he finished.  Patient was receiving HD outpatient, seemingly on MWF schedule.  Last HD was Tuesday 12/24 and he was due to return yesterday 12/26 but was unable to go due to transportation issues.  Soon afterwards, began experiencing dyspnea and presented to ED.  In the ED, patient complained of dyspnea, malaise, body aches, and chest pain.  Vitals showed profound hypertension ~220/150.  Found to have metabolic derangements with hyperphosphatemia, hypermagnesemia, but normal K.  BNP elevated >4500 and trop flatly elevated at 90>91.  Creatinine and GFR at baseline.  Nephrology was consulted, who determined need for imminent HD that could not wait until AM.  Unable to obtain IV access x2 to administer Fentanyl and labetalol.  FMTS consulted for admission for hypertensive emergency.  On FMTS evaluation, it was difficult to obtain history due to patient's bilateral hearing impariment and significant pain.  He repeatedly states that he "cannot breathe" and complains of severe occipital headache with nausea, worsened by lights.  Endorses blurry/worsened vision for the past 2 days.   Complains of R ear pain and states that his R internal jugular catheter has been hurting.  Has had a dry nonproductive cough for several days.  States that he has been out of his losartan, minoxidil, nifedipine, and carvedilol for the past 1-2 weeks, but has been taking his hydralazine.  Patient was awaiting imminent transport to HD at the conclusion of the evaluation.  Review Of Systems: Per HPI.  Pertinent Past Medical History: -Alport syndrome -Bilateral sensorineural hearing loss -ESRD on HD -Secondary hyperparathyroidism -HFmrEF -Bipolar 1 disorder -GERD -Resistant HTN -Anemia of CKD  Remainder reviewed in history tab.   Pertinent Past Surgical History: AV fistulagram June 2024 R tunneled IJ dialysis catheter placement 04/03/2023 Appendectomy Peripheral vascular balloon angioplasty June 2024 TEE 04/02/2023  Remainder reviewed in history tab.  Pertinent Social History: Obtained on chart review, unable to verify with patient this admission Tobacco use: Former Alcohol use: None Other Substance use: Hx marijuana use Lives with sister  Pertinent Family History: -HTN, T2DM, CKD in mother, deceased -Hearing loss in father -HTN in sister -Asthma in son  Remainder reviewed in history tab.   Important Outpatient Medications: -Phoslo TID -Carvedilol 25 mg BID (out for ~1-2 weeks) -Depakote 500 mg daily -Famotidine 20 mg daily -Hydralazine 100 mg TID (adherent) -Losartan 100 mg daily (out for ~1-2 weeks) -Minoxidil 5 mg BID (out for ~1-2 weeks) -Nifedipine 30 mg daily (out for ~1-2 weeks) -Pantoprazole 40 mg daily (not taking) -Tizanidine 2 mg Q6h PRN for muscle spasms  Remainder  reviewed in medication history.   Objective: BP (!) 206/147   Pulse 85   Temp 97.6 F (36.4 C) (Oral)   Resp 17   Ht 5\' 8"  (1.727 m)   Wt 83 kg   SpO2 99%   BMI 27.82 kg/m   Physical Exam: General: Appears stated age, agitated and in moderate distress due to pain. Eyes: No  injections or scleral icterus.  Photophobic. ENTM: Severely HOH, limiting history.  Dry mucous members. Neck: No rigidity.  Full ROM.  JVD.  Tunneled R IJ catheter in place and mildly tender to touch.  No surrounding erythema, infiltration, or drainage. Cardiovascular: RRR.  Difficult to auscultate due to patient's agitation and frequent speaking, but no obvious murmurs or rubs heard.  Possibly muffled heart sounds. Respiratory: Normal WOB on room air, though asks to sit up because of dyspnea during exam.  Mild scattered crackles, but otherwise CTAB. Gastrointestinal: No TTP in all quadrants, nondistended abdomen. MSK: Moving all extremities appropriately. Extremities: Trace bilateral pitting edema.  RUE AV fistula with palpable thrill and no surrounding edema or erythema. Derm: Firm, nonmobile, nontender 7 cm nodule on posterior aspect of R shoulder, unchanged from 12/9 in clinic. Neuro: Alert and oriented x4.  Agitated due to headache. Psych: Agitated, anxious affect, in significant pain and discomfort.  Labs:  CBC BMET  Recent Labs  Lab 05/08/23 1755  WBC 4.9  HGB 11.1*  HCT 33.5*  PLT 141*   Recent Labs  Lab 05/08/23 1755  NA 126*  K 4.8  CL 85*  CO2 16*  BUN 98*  CREATININE 21.01*  GLUCOSE 95  CALCIUM 9.2     Pertinent additional labs: -Trop 90>91 -BNP >4500 -RPP negative -Phosphorus 9.2 -Magnesium 2.5 -GFR 3  Own interpretation of EKG: Sinus rhythm.  LVH.  Possible partial intraventricular conduction delay.  Potential hyperK peaked T waves.  QTcB 486.  Imaging Studies: -CXR: Cardiomegaly.  Increased interstitial opacities bilaterally compared to 11/15.  No focal consolidation.  R IJ HD catheter in place.  Independently reviewed and agree with radiologist's interpretation.  Dimitry Sharion Dove, MD 05/08/2023, 8:18 PM  PGY-1, [facility] Family Medicine FPTS Intern pager: [phone], text pages welcome Secure chat group [facility] Teaching  Service   FPTS Upper-Level Resident Addendum   I have independently interviewed and examined the patient. I have discussed the above with Dr. Sharion Dove and agree with the documented plan. My edits for correction/addition/clarification are included above. Please see any attending notes.    37 year old male well-known to our service presenting with hypertensive emergency and volume overload in the setting of missed HD and recent medication nonadherence.  Patient was promptly sent for HD which I anticipate will address many of his problems including his hypertension.  Appreciate nephrology.  Will continue to follow their recommendations and make adjustments as needed based on his clinical status after HD tonight.  Vonna Drafts, MD PGY-2, [facility] Family [facility] 05/08/2023 10:47 PM  FPTS Service pager: [phone] (text pages welcome through [facility])

## 2023-05-08 NOTE — Assessment & Plan Note (Signed)
Flat troponin 90>91 and BNP >4500 in setting of volume overload.  Cardiomegaly seen on CXR.  EKG without ischemic changes and satting well on room air.  Noted effusion w/o tamponade on 11/21 during last admission.  Did not follow up with recommended outpatient echo.  Does appear to have JVD, difficult to evaluate heart sounds on exam due to agitation.  Not hypotensive.  Do not favor tamponade, but will obtain repeat TEE to follow for resolution. -TTE -Consult Cardiology based on findings

## 2023-05-08 NOTE — Assessment & Plan Note (Signed)
Has resistant HTN and baseline SBP likely 160s-180s, not compliant with outpatient antihypertensives except hydralazine.  Suspect vision changes are in the setting of hypertensive emergency.  HTN likely exacerbated by volume overload.  Will need to use caution in bringing down BP, limit to 25% in first 24 hours. -Admit to FMTS, attending Dr. Jennette Kettle, Progressive unit -Continuous cardiac monitoring -PT/OT eval and treat -Restart home hydralazine 100 mg TID, losartan 100 mg daily, minoxidil 5 mg BID, nifedipine XL 30 mg daily, carvedilol 25 mg BID -Goal reduction in SBP of 25% from ~220/150 in first 24 hours -Reassess BP following HD, will add PRN labetalol if elevated above goal

## 2023-05-08 NOTE — Assessment & Plan Note (Signed)
Difficult to elicit in history due to hearing impairment and headache on admission, but may be having 3-4 loose stools daily for the past few weeks noted on clinic visit 12/9 and admission 11/15.  Consider C. diff workup given risk in setting of HD and recent vancomycin course. -Reevaluate in AM following HD

## 2023-05-08 NOTE — ED Triage Notes (Signed)
Pt BIB EMS from home for SOB after missing dialysis yesterday. Pt reporting generalized pain, SOB at rest productive cough, headache, ear pain, chronic changes in vision that has worsened over the past 2 weeks. Pt has heading aid in right ear, but is very HOH. Pt is A&Ox4, NAD noted at this time.   EMS VS 220/136  121 BG 104 HR 28-30 RR 27 cap NSR

## 2023-05-08 NOTE — ED Notes (Signed)
Pt not tolerating PIV twice.

## 2023-05-09 ENCOUNTER — Inpatient Hospital Stay (HOSPITAL_COMMUNITY): Payer: Medicare Other

## 2023-05-09 DIAGNOSIS — I161 Hypertensive emergency: Secondary | ICD-10-CM

## 2023-05-09 LAB — RENAL FUNCTION PANEL
Albumin: 3 g/dL — ABNORMAL LOW (ref 3.5–5.0)
Anion gap: 16 — ABNORMAL HIGH (ref 5–15)
BUN: 50 mg/dL — ABNORMAL HIGH (ref 6–20)
CO2: 21 mmol/L — ABNORMAL LOW (ref 22–32)
Calcium: 9.2 mg/dL (ref 8.9–10.3)
Chloride: 93 mmol/L — ABNORMAL LOW (ref 98–111)
Creatinine, Ser: 13.82 mg/dL — ABNORMAL HIGH (ref 0.61–1.24)
GFR, Estimated: 4 mL/min — ABNORMAL LOW (ref >60)
Glucose, Bld: 114 mg/dL — ABNORMAL HIGH (ref 70–99)
Phosphorus: 6.5 mg/dL — ABNORMAL HIGH (ref 2.5–4.6)
Potassium: 3.8 mmol/L (ref 3.5–5.1)
Sodium: 130 mmol/L — ABNORMAL LOW (ref 135–145)

## 2023-05-09 LAB — CBC
HCT: 31.6 % — ABNORMAL LOW (ref 39.0–52.0)
Hemoglobin: 10.5 g/dL — ABNORMAL LOW (ref 13.0–17.0)
MCH: 28.1 pg (ref 26.0–34.0)
MCHC: 33.2 g/dL (ref 30.0–36.0)
MCV: 84.5 fL (ref 80.0–100.0)
Platelets: 148 10*3/uL — ABNORMAL LOW (ref 150–400)
RBC: 3.74 MIL/uL — ABNORMAL LOW (ref 4.22–5.81)
RDW: 15.7 % — ABNORMAL HIGH (ref 11.5–15.5)
WBC: 4.7 10*3/uL (ref 4.0–10.5)
nRBC: 0 % (ref 0.0–0.2)

## 2023-05-09 LAB — GLUCOSE, CAPILLARY: Glucose-Capillary: 91 mg/dL (ref 70–99)

## 2023-05-09 LAB — MRSA NEXT GEN BY PCR, NASAL: MRSA by PCR Next Gen: NOT DETECTED

## 2023-05-09 LAB — BRAIN NATRIURETIC PEPTIDE: B Natriuretic Peptide: 4476.4 pg/mL — ABNORMAL HIGH (ref 0.0–100.0)

## 2023-05-09 LAB — MAGNESIUM: Magnesium: 1.9 mg/dL (ref 1.7–2.4)

## 2023-05-09 LAB — ECHOCARDIOGRAM COMPLETE
Height: 68 in
Weight: 2927.71 [oz_av]

## 2023-05-09 MED ORDER — FAMOTIDINE 20 MG PO TABS
20.0000 mg | ORAL_TABLET | Freq: Every day | ORAL | Status: DC | PRN
  Administered 2023-05-10 – 2023-05-13 (×2): 20 mg via ORAL
  Filled 2023-05-09 (×2): qty 1

## 2023-05-09 MED ORDER — NIFEDIPINE ER OSMOTIC RELEASE 30 MG PO TB24: 30.0000 mg | ORAL_TABLET | Freq: Every day | ORAL | Status: DC

## 2023-05-09 MED ORDER — PROCHLORPERAZINE EDISYLATE 10 MG/2ML IJ SOLN
10.0000 mg | Freq: Four times a day (QID) | INTRAMUSCULAR | Status: DC | PRN
  Administered 2023-05-09 – 2023-05-13 (×3): 10 mg via INTRAVENOUS
  Filled 2023-05-09 (×3): qty 2

## 2023-05-09 MED ORDER — HEPARIN SODIUM (PORCINE) 1000 UNIT/ML IJ SOLN
3200.0000 [IU] | INTRAMUSCULAR | Status: DC | PRN
  Filled 2023-05-09: qty 4

## 2023-05-09 MED ORDER — CLEVIDIPINE BUTYRATE 0.5 MG/ML IV EMUL
INTRAVENOUS | Status: AC
  Administered 2023-05-09: 2 mg/h via INTRAVENOUS
  Filled 2023-05-09: qty 100

## 2023-05-09 MED ORDER — ACETAMINOPHEN 650 MG RE SUPP: 650.0000 mg | Freq: Four times a day (QID) | RECTAL | Status: DC | PRN

## 2023-05-09 MED ORDER — DIVALPROEX SODIUM ER 500 MG PO TB24
500.0000 mg | ORAL_TABLET | Freq: Every day | ORAL | Status: DC
  Administered 2023-05-09 – 2023-05-14 (×6): 500 mg via ORAL
  Filled 2023-05-09 (×8): qty 1

## 2023-05-09 MED ORDER — CLEVIDIPINE BUTYRATE 0.5 MG/ML IV EMUL
1.0000 mg/h | INTRAVENOUS | Status: DC
  Administered 2023-05-09 – 2023-05-10 (×2): 10 mg/h via INTRAVENOUS
  Filled 2023-05-09: qty 50
  Filled 2023-05-09: qty 100

## 2023-05-09 MED ORDER — FENTANYL CITRATE PF 50 MCG/ML IJ SOSY
12.5000 ug | PREFILLED_SYRINGE | INTRAMUSCULAR | Status: DC | PRN
  Administered 2023-05-09: 12.5 ug via INTRAVENOUS
  Filled 2023-05-09: qty 1

## 2023-05-09 MED ORDER — LOSARTAN POTASSIUM 50 MG PO TABS
100.0000 mg | ORAL_TABLET | Freq: Every day | ORAL | Status: DC
Start: 2023-05-09 — End: 2023-05-09

## 2023-05-09 MED ORDER — FENTANYL CITRATE PF 50 MCG/ML IJ SOSY: 25.0000 ug | PREFILLED_SYRINGE | INTRAMUSCULAR | Status: DC | PRN

## 2023-05-09 MED ORDER — HYDRALAZINE HCL 50 MG PO TABS: 100.0000 mg | ORAL_TABLET | Freq: Three times a day (TID) | ORAL | Status: DC

## 2023-05-09 MED ORDER — LOSARTAN POTASSIUM 50 MG PO TABS
100.0000 mg | ORAL_TABLET | Freq: Every day | ORAL | Status: DC
  Administered 2023-05-09 – 2023-05-11 (×3): 100 mg via ORAL
  Filled 2023-05-09 (×3): qty 2

## 2023-05-09 MED ORDER — FENTANYL CITRATE PF 50 MCG/ML IJ SOSY
25.0000 ug | PREFILLED_SYRINGE | INTRAMUSCULAR | Status: AC | PRN
  Administered 2023-05-09: 25 ug via INTRAVENOUS
  Filled 2023-05-09: qty 1

## 2023-05-09 MED ORDER — TRAMADOL HCL 50 MG PO TABS
50.0000 mg | ORAL_TABLET | Freq: Four times a day (QID) | ORAL | Status: DC | PRN
  Administered 2023-05-09 – 2023-05-11 (×3): 50 mg via ORAL
  Filled 2023-05-09 (×5): qty 1

## 2023-05-09 MED ORDER — NIFEDIPINE ER OSMOTIC RELEASE 30 MG PO TB24
30.0000 mg | ORAL_TABLET | Freq: Every day | ORAL | Status: DC
  Filled 2023-05-09: qty 1

## 2023-05-09 MED ORDER — HEPARIN SODIUM (PORCINE) 1000 UNIT/ML IJ SOLN
INTRAMUSCULAR | Status: AC
  Filled 2023-05-09: qty 1

## 2023-05-09 MED ORDER — CARVEDILOL 25 MG PO TABS
25.0000 mg | ORAL_TABLET | Freq: Two times a day (BID) | ORAL | Status: DC
  Administered 2023-05-09 – 2023-05-13 (×9): 25 mg via ORAL
  Filled 2023-05-09: qty 1
  Filled 2023-05-09: qty 2
  Filled 2023-05-09 (×7): qty 1

## 2023-05-09 MED ORDER — FENTANYL CITRATE PF 50 MCG/ML IJ SOSY
50.0000 ug | PREFILLED_SYRINGE | Freq: Once | INTRAMUSCULAR | Status: AC
  Administered 2023-05-09: 50 ug via INTRAVENOUS

## 2023-05-09 MED ORDER — MINOXIDIL 2.5 MG PO TABS
5.0000 mg | ORAL_TABLET | Freq: Two times a day (BID) | ORAL | Status: DC
  Administered 2023-05-09 – 2023-05-10 (×4): 5 mg via ORAL
  Filled 2023-05-09 (×4): qty 2

## 2023-05-09 MED ORDER — NIFEDIPINE ER OSMOTIC RELEASE 30 MG PO TB24
30.0000 mg | ORAL_TABLET | Freq: Every day | ORAL | Status: DC
  Administered 2023-05-09 – 2023-05-10 (×2): 30 mg via ORAL
  Filled 2023-05-09 (×2): qty 1

## 2023-05-09 MED ORDER — HYDRALAZINE HCL 50 MG PO TABS
100.0000 mg | ORAL_TABLET | Freq: Three times a day (TID) | ORAL | Status: DC
  Administered 2023-05-09 – 2023-05-14 (×14): 100 mg via ORAL
  Filled 2023-05-09 (×14): qty 2

## 2023-05-09 MED ORDER — NICARDIPINE HCL IN NACL 20-0.86 MG/200ML-% IV SOLN
10.0000 mg/h | INTRAVENOUS | Status: AC
  Administered 2023-05-09 (×2): 10 mg/h via INTRAVENOUS
  Administered 2023-05-09: 5 mg/h via INTRAVENOUS
  Administered 2023-05-09 (×2): 10 mg/h via INTRAVENOUS
  Filled 2023-05-09 (×7): qty 200

## 2023-05-09 MED ORDER — NITROGLYCERIN IN D5W 200-5 MCG/ML-% IV SOLN
0.0000 ug/min | INTRAVENOUS | Status: DC
  Administered 2023-05-09: 5 ug/min via INTRAVENOUS
  Filled 2023-05-09: qty 250

## 2023-05-09 MED ORDER — ACETAMINOPHEN 325 MG PO TABS
650.0000 mg | ORAL_TABLET | Freq: Four times a day (QID) | ORAL | Status: DC | PRN
  Administered 2023-05-09 – 2023-05-14 (×8): 650 mg via ORAL
  Filled 2023-05-09 (×6): qty 2

## 2023-05-09 MED ORDER — LORAZEPAM 2 MG/ML IJ SOLN
0.2500 mg | Freq: Once | INTRAMUSCULAR | Status: AC
  Administered 2023-05-09: 0.25 mg via INTRAVENOUS
  Filled 2023-05-09: qty 1

## 2023-05-09 MED ORDER — LABETALOL HCL 5 MG/ML IV SOLN
20.0000 mg | Freq: Once | INTRAVENOUS | Status: DC
  Filled 2023-05-09: qty 4

## 2023-05-09 NOTE — Progress Notes (Signed)
   05/09/23 1400  Pain Assessment  Pain Scale 0-10  Pain Score 10  Pain Type Acute pain;Chronic pain  Pain Location Head  Pain Descriptors / Indicators Aching;Constant;Contraction  Pain Frequency Constant  Pain Onset On-going  Pain Intervention(s) Medication (See eMAR)     Patient reports ongoing pain, requesting pain medication. See new orders.

## 2023-05-09 NOTE — ED Notes (Signed)
ED TO INPATIENT HANDOFF REPORT  ED Nurse Name and Phone #: Donnal Debar [phone]  S Name/Age/Gender [name] 37 y.o. male Room/Bed: H018C/H018C  Code Status   Code Status: Full Code  Home/SNF/Other Home Patient oriented to: self, place, time, and situation Is this baseline? Yes   Triage Complete: Triage complete  Chief Complaint Dyspnea, unspecified type [R06.00] Hypertensive emergency [I16.1]  Triage Note Pt BIB EMS from home for SOB after missing dialysis yesterday. Pt reporting generalized pain, SOB at rest productive cough, headache, ear pain, chronic changes in vision that has worsened over the past 2 weeks. Pt has heading aid in right ear, but is very HOH. Pt is A&Ox4, NAD noted at this time.   EMS VS 220/136  121 BG 104 HR 28-30 RR 27 cap NSR    Allergies Allergies  Allergen Reactions   Zestril [Lisinopril] Swelling   Nsaids Other (See Comments)    "Kidney problems "   Risperdal [Risperidone] Other (See Comments)    Unknown reaction     Level of Care/Admitting Diagnosis ED Disposition     ED Disposition  Admit   Condition  --   Comment  The patient appears reasonably stabilized for admission considering the current resources, flow, and capabilities available in the ED at this time, and I doubt any other [facility] requiring further screening and/or treatment in the ED prior to admission is  present.          B Medical/Surgery History 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   Heart murmur    Hypertension    Low blood sugar    Marijuana abuse    Noncompliance    Pericardial effusion 03/28/2023   Tobacco abuse    Past Surgical  History:  Procedure Laterality Date   A/V FISTULAGRAM Right 10/27/2022   Procedure: A/V Fistulagram;  Surgeon: Chuck Hint, MD;  Location: [facility] INVASIVE CV LAB;  Service: Cardiovascular;  Laterality: Right;   APPENDECTOMY     AV FISTULA PLACEMENT Left 03/03/2019   Procedure: ARTERIOVENOUS (AV) FISTULA CREATION LEFT ARM;  Surgeon: Maeola Harman, MD;  Location: [facility] OR;  Service: Vascular;  Laterality: Left;   BASCILIC VEIN TRANSPOSITION Right 04/12/2019   Procedure: RIGHT UPPER EXTREMITY BASCILIC VEIN TRANSPOSITION FIRST STAGE FISTULA;  Surgeon: Chuck Hint, MD;  Location: [facility] OR;  Service: Vascular;  Laterality: Right;   BIOPSY  10/14/2021   Procedure: BIOPSY;  Surgeon: Sherrilyn Rist, MD;  Location: [facility] ENDOSCOPY;  Service: Gastroenterology;;   BIOPSY  06/06/2022   Procedure: BIOPSY;  Surgeon: Napoleon Form, MD;  Location: [facility] ENDOSCOPY;  Service: Gastroenterology;;   BIOPSY  11/12/2022   Procedure: BIOPSY;  Surgeon: Lemar Lofty., MD;  Location: [facility] ENDOSCOPY;  Service: Gastroenterology;;   BIOPSY  12/05/2022   Procedure: BIOPSY;  Surgeon: [name], MD;  Location: [facility] ENDOSCOPY;  Service: Gastroenterology;;   COLONOSCOPY WITH PROPOFOL N/A 10/14/2021   Procedure: COLONOSCOPY WITH PROPOFOL;  Surgeon: Sherrilyn Rist, MD;  Location: [facility] ENDOSCOPY;  Service: Gastroenterology;  Laterality: N/A;  DIALYSIS/PERMA CATHETER INSERTION N/A 04/03/2023   Procedure: DIALYSIS/PERMA CATHETER INSERTION;  Surgeon: Leonie Douglas, MD;  Location: [facility] INVASIVE CV LAB;  Service: Cardiovascular;  Laterality: N/A;   ESOPHAGOGASTRODUODENOSCOPY N/A 11/12/2022   Procedure: ESOPHAGOGASTRODUODENOSCOPY (EGD);  Surgeon: Lemar Lofty., MD;  Location: [facility] ENDOSCOPY;  Service: Gastroenterology;  Laterality: N/A;   ESOPHAGOGASTRODUODENOSCOPY N/A 12/05/2022   Procedure: ESOPHAGOGASTRODUODENOSCOPY (EGD);  Surgeon: [name], MD;  Location: [facility] ENDOSCOPY;  Service:  Gastroenterology;  Laterality: N/A;   ESOPHAGOGASTRODUODENOSCOPY (EGD) WITH PROPOFOL N/A 06/06/2022   Procedure: ESOPHAGOGASTRODUODENOSCOPY (EGD) WITH PROPOFOL;  Surgeon: Napoleon Form, MD;  Location: [facility] ENDOSCOPY;  Service: Gastroenterology;  Laterality: N/A;   FISTULA SUPERFICIALIZATION Right 11/07/2022   Procedure: PLICATION OF LARGE ANEURYSMS OF RIGHT ARTERIOVENOUS FISTULA WITH INSERTION OF 7mm INTERPOSITIONAL GORTEX GRAFT;  Surgeon: Chuck Hint, MD;  Location: [facility] OR;  Service: Vascular;  Laterality: Right;   FLEXIBLE SIGMOIDOSCOPY N/A 04/14/2022   Procedure: FLEXIBLE SIGMOIDOSCOPY;  Surgeon: Napoleon Form, MD;  Location: [facility] ENDOSCOPY;  Service: Gastroenterology;  Laterality: N/A;   INSERTION OF DIALYSIS CATHETER N/A 11/07/2022   Procedure: INSERTION OF RIGHT INTERNAL JUGULAR TUNNELED DIALYSIS CATHETER;  Surgeon: Chuck Hint, MD;  Location: [facility] OR;  Service: Vascular;  Laterality: N/A;   LIGATION OF ARTERIOVENOUS  FISTULA Left 03/06/2019   Procedure: LIGATION OF ARTERIOVENOUS  FISTULA;  Surgeon: Cephus Shelling, MD;  Location: [facility] OR;  Service: Vascular;  Laterality: Left;   PERIPHERAL VASCULAR BALLOON ANGIOPLASTY  10/27/2022   Procedure: PERIPHERAL VASCULAR BALLOON ANGIOPLASTY;  Surgeon: Chuck Hint, MD;  Location: [facility] INVASIVE CV LAB;  Service: Cardiovascular;;  rt upper arm fistula   SAVORY DILATION N/A 11/12/2022   Procedure: SAVORY DILATION;  Surgeon: Meridee Score Netty Starring., MD;  Location: [facility] ENDOSCOPY;  Service: Gastroenterology;  Laterality: N/A;   spinal tap     SPINE SURGERY     related to a spinal infection, unsure of surgery or infection source   TRANSESOPHAGEAL ECHOCARDIOGRAM (CATH LAB) N/A 04/02/2023   Procedure: TRANSESOPHAGEAL ECHOCARDIOGRAM;  Surgeon: Jake Bathe, MD;  Location: [facility] INVASIVE CV LAB;  Service: Cardiovascular;  Laterality: N/A;   WISDOM TOOTH EXTRACTION       A IV Location/Drains/Wounds Patient Lines/Drains/Airways  Status     Active Line/Drains/Airways     Name Placement date Placement time Site Days   Fistula / Graft Right Upper arm Arteriovenous fistula 04/12/19  1105  Upper arm  1488   Hemodialysis Catheter Right Internal jugular Double lumen Permanent (Tunneled) 04/03/23  1228  Internal jugular  36            Intake/Output Last 24 hours  Intake/Output Summary (Last 24 hours) at 05/09/2023 4540 Last data filed at 05/09/2023 0119 Gross per 24 hour  Intake --  Output 6100 ml  Net -6100 ml    Labs/Imaging Results for orders placed or performed during the hospital encounter of 05/08/23 (from the past 48 hours)  Brain natriuretic peptide     Status: Abnormal   Collection Time: 05/08/23  5:33 PM  Result Value Ref Range   B Natriuretic Peptide >4,500.0 (H) 0.0 - 100.0 pg/mL    Comment: Performed at [facility] Lab, [address], [place], [place] [address]  CBC with Differential     Status: Abnormal   Collection Time: 05/08/23  5:55 PM  Result Value Ref Range   WBC 4.9 4.0 - 10.5 K/uL   RBC 3.99 (L) 4.22 - 5.81 MIL/uL   Hemoglobin 11.1 (L)  13.0 - 17.0 g/dL   HCT 40.9 (L) 81.1 - 91.4 %   MCV 84.0 80.0 - 100.0 fL   MCH 27.8 26.0 - 34.0 pg   MCHC 33.1 30.0 - 36.0 g/dL   RDW 78.2 (H) 95.6 - 21.3 %   Platelets 141 (L) 150 - 400 K/uL    Comment: REPEATED TO VERIFY   nRBC 0.0 0.0 - 0.2 %   Neutrophils Relative % 72 %   Neutro Abs 3.5 1.7 - 7.7 K/uL   Lymphocytes Relative 16 %   Lymphs Abs 0.8 0.7 - 4.0 K/uL   Monocytes Relative 9 %   Monocytes Absolute 0.4 0.1 - 1.0 K/uL   Eosinophils Relative 3 %   Eosinophils Absolute 0.1 0.0 - 0.5 K/uL   Basophils Relative 0 %   Basophils Absolute 0.0 0.0 - 0.1 K/uL   Immature Granulocytes 0 %   Abs Immature Granulocytes 0.02 0.00 - 0.07 K/uL    Comment: Performed at [facility] Lab, [address] [place], [place] [address]  Comprehensive metabolic panel     Status: Abnormal   Collection Time: 05/08/23  5:55 PM  Result Value Ref  Range   Sodium 126 (L) 135 - 145 mmol/L   Potassium 4.8 3.5 - 5.1 mmol/L   Chloride 85 (L) 98 - 111 mmol/L   CO2 16 (L) 22 - 32 mmol/L   Glucose, Bld 95 70 - 99 mg/dL    Comment: Glucose reference range applies only to samples taken after fasting for at least 8 hours.   BUN 98 (H) 6 - 20 mg/dL   Creatinine, Ser 84.69 (H) 0.61 - 1.24 mg/dL   Calcium 9.2 8.9 - 62.9 mg/dL   Total Protein 6.2 (L) 6.5 - 8.1 g/dL   Albumin 3.1 (L) 3.5 - 5.0 g/dL   AST 21 15 - 41 U/L   ALT 12 0 - 44 U/L   Alkaline Phosphatase 88 38 - 126 U/L   Total Bilirubin 0.7 <1.2 mg/dL   GFR, Estimated 3 (L) >60 mL/min    Comment: (NOTE) Calculated using the CKD-EPI Creatinine Equation (2021)    Anion gap 25 (H) 5 - 15    Comment: ELECTROLYTES REPEATED TO VERIFY Performed at [facility], [address], [place], [place] [address]   Troponin I (High Sensitivity)     Status: Abnormal   Collection Time: 05/08/23  5:55 PM  Result Value Ref Range   Troponin I (High Sensitivity) 90 (H) <18 ng/L    Comment: (NOTE) Elevated high sensitivity troponin I (hsTnI) values and significant  changes across serial measurements may suggest ACS but many other  chronic and acute conditions are known to elevate hsTnI results.  Refer to the "Links" section for chest pain algorithms and additional  guidance. Performed at [facility] Lab, [address] [place], [place] [address]   Magnesium     Status: Abnormal   Collection Time: 05/08/23  5:55 PM  Result Value Ref Range   Magnesium 2.5 (H) 1.7 - 2.4 mg/dL    Comment: Performed at [facility], [address] [place], [place] [address]  Phosphorus     Status: Abnormal   Collection Time: 05/08/23  5:55 PM  Result Value Ref Range   Phosphorus 9.2 (H) 2.5 - 4.6 mg/dL    Comment: Performed at [facility] Lab, [address], [place], [place] [address]  Resp panel by RT-PCR (RSV, Flu A&B, Covid) Anterior Nasal Swab  Status: None   Collection Time: 05/08/23   5:58 PM   Specimen: Anterior Nasal Swab  Result Value Ref Range   SARS Coronavirus 2 by RT PCR NEGATIVE NEGATIVE   Influenza A by PCR NEGATIVE NEGATIVE   Influenza B by PCR NEGATIVE NEGATIVE    Comment: (NOTE) The Xpert Xpress SARS-CoV-2/FLU/RSV plus assay is intended as an aid in the diagnosis of influenza from Nasopharyngeal swab specimens and should not be used as a sole basis for treatment. Nasal washings and aspirates are unacceptable for Xpert Xpress SARS-CoV-2/FLU/RSV testing.  Fact Sheet for Patients: BloggerCourse.com  Fact Sheet for Healthcare Providers: SeriousBroker.it  This test is not yet approved or cleared by the [place] FDA and has been authorized for detection and/or diagnosis of SARS-CoV-2 by FDA under an Emergency Use Authorization (EUA). This EUA will remain in effect (meaning this test can be used) for the duration of the COVID-19 declaration under Section 564(b)(1) of the Act, 21 U.S.C. section 360bbb-3(b)(1), unless the authorization is terminated or revoked.     Resp Syncytial Virus by PCR NEGATIVE NEGATIVE    Comment: (NOTE) Fact Sheet for Patients: BloggerCourse.com  Fact Sheet for Healthcare Providers: SeriousBroker.it  This test is not yet approved or cleared by the [place] FDA and has been authorized for detection and/or diagnosis of SARS-CoV-2 by FDA under an Emergency Use Authorization (EUA). This EUA will remain in effect (meaning this test can be used) for the duration of the COVID-19 declaration under Section 564(b)(1) of the Act, 21 U.S.C. section 360bbb-3(b)(1), unless the authorization is terminated or revoked.  Performed at [facility], [address] [place], [place] [address]   Troponin I (High Sensitivity)     Status: Abnormal   Collection Time: 05/08/23  7:35 PM  Result Value Ref Range   Troponin I (High  Sensitivity) 91 (H) <18 ng/L    Comment: (NOTE) Elevated high sensitivity troponin I (hsTnI) values and significant  changes across serial measurements may suggest ACS but many other  chronic and acute conditions are known to elevate hsTnI results.  Refer to the "Links" section for chest pain algorithms and additional  guidance. Performed at [facility], [address], [place], [place] [address]   Hepatitis B surface antigen     Status: None   Collection Time: 05/08/23  9:14 PM  Result Value Ref Range   Hepatitis B Surface Ag NON REACTIVE NON REACTIVE    Comment: Performed at [facility] Lab, [address] [place], [place] [address]   DG Chest Portable 1 View Result Date: 05/08/2023 CLINICAL DATA:  Acute coronary syndrome. EXAM: PORTABLE CHEST 1 VIEW COMPARISON:  Touched radiographs on 152024, 01/05/2023, 01/04/2023 FINDINGS: Right internal jugular dual-lumen central venous catheter with tip overlying the superior vena cava/right atrial junction is unchanged from prior. Cardiac silhouette is again moderately enlarged. Mediastinal contours are within normal limits. There is mildly worsened interstitial thickening bilaterally. There is again cephalization of the pulmonary vasculature. No pleural effusion or pneumothorax.  No acute skeletal abnormality. IMPRESSION: Mildly worsened interstitial thickening bilaterally, likely worsened mild interstitial pulmonary edema. Electronically Signed   By: Neita Garnet M.D.   On: 05/08/2023 18:42    Pending Labs Unresulted Labs (From admission, onward)     Start     Ordered   05/08/23 2014  Hepatitis B surface antibody,quantitative  (New Admission Hemo Labs (Hepatitis B))  Once,   URGENT        05/08/23 2016  05/08/23 1758  Urinalysis, w/ Reflex to Culture (Infection Suspected) -Urine, Clean Catch  Once,   URGENT       Question:  Specimen Source  Answer:  Urine, Clean Catch   05/08/23 1757   Signed and Held  Renal function panel  Once,    R        Signed and Held   Signed and Held  CBC  Once,   R        Signed and Held   Signed and Held  Renal function panel  Tomorrow morning,   R        Signed and Held   Signed and Held  Magnesium  Tomorrow morning,   R        Signed and Held   Signed and Held  CBC  Tomorrow morning,   R        Signed and Held            Vitals/Pain Today's Vitals   05/09/23 0044 05/09/23 0104 05/09/23 0119 05/09/23 0159  BP: (!) 221/129  (!) 234/148 (!) 209/127  Pulse: 80 78 78 84  Resp: (!) 21 15 18  (!) 22  Temp:   97.6 F (36.4 C) 98.8 F (37.1 C)  TempSrc:   Oral Oral  SpO2: 96% 97% 98% 100%  Weight:      Height:      PainSc:   10-Worst pain ever     Isolation Precautions No active isolations  Medications Medications  fentaNYL (SUBLIMAZE) injection 50 mcg (50 mcg Intravenous Not Given 05/08/23 1812)  Chlorhexidine Gluconate Cloth 2 % PADS 6 each (has no administration in time range)  heparin injection 5,000 Units (has no administration in time range)  acetaminophen (TYLENOL) tablet 1,000 mg (1,000 mg Oral Given 05/08/23 1810)  labetalol (NORMODYNE) injection 20 mg (20 mg Intravenous Given 05/09/23 0030)  oxyCODONE (Oxy IR/ROXICODONE) immediate release tablet 5 mg (5 mg Oral Given 05/08/23 2255)    Mobility walks with device     Focused Assessments     R Recommendations: See Admitting Provider Note  Report given to:   Additional Notes:

## 2023-05-09 NOTE — Consult Note (Signed)
NAME:  [name], MRN:  [id number], DOB:  December 05, 1985, LOS: 1 ADMISSION DATE:  05/08/2023, CONSULTATION DATE:  12/28 REFERRING MD:  Jennette Kettle, CHIEF COMPLAINT:  hypertensive emergency    History of Present Illness:  37 year old male w/ hx as outlined below. Presented to ER 12/27 w/ cc: SOB, HA, visual change, ear pain and cough. Initial BP 215/157 (175)HR 88. Trop I 90->91, RVP neg, cr 21.01, bun 98, k 4.6, Na 126, CXR CM w/ bilateral edema  Missed two rounds of home iHD. Nephro consulted in ER. Got emergent iHD net neg ~6 liters in addition to antihypertensives.   On 12/28 Pt still w/ HA and BP 207/127 (154) w/ right sided HA and ear pain.  Had received several PRN antihypertensives in addition to oral rx started. Remained hypertensive so NTG gtt started. Orals stopped (out of concern for too rapid decline in BP while on Gtt?? )  Went to CT scan for on-going head ache. His CT head was neg for acute findings. Had chronic right mastoid effusion noted on prior imaging.  As he was placed on NGT gtt PCCM consulted.     Pertinent  Medical History  Alport syndrome, ESRD (MWF), BPD1, bilateral sensorineural hearing loss, anemia of CKD, secondary hyperparathyroidism.  Recent hospital stay for sepsis, felt AV fistula infection  Mod pericardial effusion   Significant Hospital Events: Including procedures, antibiotic start and stop dates in addition to other pertinent events   12/27 presented w/ hypertensive emergency initial MAP 175. Treated w/ antihypertensives and emergent iHD. ~ 6 liters off 12/27 started on Ntg gtt for MAP still in 150s. CT head neg for acute findings. PCCM consulted for hypertensive emergency   Interim History / Subjective:  Reports that he is still short of breath, still has a headache  Objective   Blood pressure (!) 219/109, pulse 90, temperature 97.9 F (36.6 C), resp. rate 20, height 5\' 8"  (1.727 m), weight 83 kg, SpO2 98%.        Intake/Output Summary (Last 24  hours) at 05/09/2023 0735 Last data filed at 05/09/2023 0119 Gross per 24 hour  Intake --  Output 6100 ml  Net -6100 ml   Filed Weights   05/08/23 1830  Weight: 83 kg    Examination: General: 37 year old male patient sitting up in bed.  He is fatigued, short of breath, unable to lay flat HENT: Normocephalic atraumatic pupils are equal and reactive no JVD mucous membranes moist, very hard of hearing Lungs: Faint basilar rales, some occasional rhonchi.  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  Presented with chest pain shortness of breath and headache -Initial systolic blood pressure 215, mean arterial pressure 175.  Remains fairly hypertensive postdialysis however really has not received much in the way of therapy medication wise. Plan Discontinue nitroglycerin drip Initiate nicardipine drip with systolic blood pressure goal 165-180, Continue oral antihypertensives Sending serum drug screen He likely needs another round of dialysis today Repeating chest x-ray and BNP Follow-up echo Will need ICU admission for titration of antihypertensives  Dyspnea secondary to acute pulmonary edema from volume  overload -Apparently remove 6 L and dialysis -Still short of breath, and orthopneic Plan Repeat chest x-ray Continue pulse oximetry Dialysis as indicated  History of pericardial effusion, has mild elevated troponin, but these have remained flat at 90.   Plan Echocardiogram pending   End-stage renal disease 2/2 Alports syndrome (iHD M/W/F) w/ chronic hyponatremia and  secondary hyperparathyroidism Plan Awaiting follow-up labs Likely needs dialysis again today given symptoms Will ensure nephrology is following, apparently got dialysis yesterday  Headache acute on chronic. Has h/o Migraines Suspect secondary to his hypertension.  I doubt that the nitroglycerin is helping, may be making it worse.  CT negative Plan Continuing supportive care Will need to be careful with as needed narcotics he is very sleepy, I am going to decrease the fentanyl dosing to 12.5  Anemia of CKD Hemoglobin stable Plan Monitor  Alport syndrome with bilateral hearing loss Plan Supportive care  History of bipolar disease type I Plan Continue Depakote  History of GERD Plan Continue H2 blocker  Diarrhea Plan Monitor May need to consider C. difficile evaluation Best Practice (right click and "Reselect all SmartList Selections" daily)   Diet/type: Regular consistency (see orders) DVT prophylaxis prophylactic heparin  Pressure ulcer(s): N/A GI prophylaxis: H2B Lines: N/A Foley:  N/A Code Status:  full code Last date of multidisciplinary goals of care discussion [pending]  Labs   CBC: Recent Labs  Lab 05/08/23 1755  WBC 4.9  NEUTROABS 3.5  HGB 11.1*  HCT 33.5*  MCV 84.0  PLT 141*    Basic Metabolic Panel: Recent Labs  Lab 05/08/23 1755  NA 126*  K 4.8  CL 85*  CO2 16*  GLUCOSE 95  BUN 98*  CREATININE 21.01*  CALCIUM 9.2  MG 2.5*  PHOS 9.2*   GFR: Estimated Creatinine Clearance: 5.2 mL/min (A) (by C-G formula based on SCr of 21.01 mg/dL (H)). Recent Labs  Lab 05/08/23 1755  WBC 4.9    Liver Function Tests: Recent Labs  Lab 05/08/23 1755  AST 21  ALT 12  ALKPHOS 88  BILITOT 0.7  PROT 6.2*  ALBUMIN 3.1*   No results for input(s): "LIPASE", "AMYLASE" in the last 168 hours. No results for input(s): "AMMONIA" in the last 168 hours.  ABG    Component Value Date/Time   HCO3 23.0 11/17/2022 0755   TCO2 27 03/27/2023 1838    ACIDBASEDEF 1.0 11/17/2022 0755   O2SAT 92 11/17/2022 0755     Coagulation Profile: No results for input(s): "INR", "PROTIME" in the last 168 hours.  Cardiac Enzymes: No results for input(s): "CKTOTAL", "CKMB", "CKMBINDEX", "TROPONINI" in the last 168 hours.  HbA1C: Hemoglobin A1C  Date/Time Value Ref Range Status  05/30/2022 02:49 PM 4.6 4.0 - 5.6 % Final  02/11/2021 12:06 PM 5.1 4.0 - 5.6 % Final    CBG: No results for input(s): "GLUCAP" in the last 168 hours.  Review of Systems:   Review of Systems  Constitutional:  Positive for malaise/fatigue. Negative for fever.  HENT:  Positive for hearing loss.   Eyes:  Positive for blurred vision.  Respiratory:  Positive for shortness of breath.   Cardiovascular:  Positive for chest pain and orthopnea.  Gastrointestinal:  Positive for diarrhea.  Genitourinary: Negative.   Musculoskeletal:  Positive for myalgias.  Skin: Negative.   Neurological:  Positive for headaches.  Endo/Heme/Allergies: Negative.      Past Medical History:  He,  has a past medical history of Alport syndrome, Anemia, Bipolar 1 disorder (HCC), CHF (congestive heart  failure) (HCC), Depression, ESRD (end stage renal disease) on dialysis (HCC), GERD (gastroesophageal reflux disease), Headache, Hearing difficulty of left ear, Hearing disorder of right ear, Heart murmur, Hypertension, Low blood sugar, Marijuana abuse, Noncompliance, Pericardial effusion (03/28/2023), and Tobacco abuse.   Surgical History:   Past Surgical History:  Procedure Laterality Date   A/V FISTULAGRAM Right 10/27/2022   Procedure: A/V Fistulagram;  Surgeon: Chuck Hint, MD;  Location: [facility] INVASIVE CV LAB;  Service: Cardiovascular;  Laterality: Right;   APPENDECTOMY     AV FISTULA PLACEMENT Left 03/03/2019   Procedure: ARTERIOVENOUS (AV) FISTULA CREATION LEFT ARM;  Surgeon: Maeola Harman, MD;  Location: [facility] OR;  Service: Vascular;  Laterality: Left;   BASCILIC VEIN  TRANSPOSITION Right 04/12/2019   Procedure: RIGHT UPPER EXTREMITY BASCILIC VEIN TRANSPOSITION FIRST STAGE FISTULA;  Surgeon: Chuck Hint, MD;  Location: [facility] OR;  Service: Vascular;  Laterality: Right;   BIOPSY  10/14/2021   Procedure: BIOPSY;  Surgeon: Sherrilyn Rist, MD;  Location: [facility] ENDOSCOPY;  Service: Gastroenterology;;   BIOPSY  06/06/2022   Procedure: BIOPSY;  Surgeon: Napoleon Form, MD;  Location: [facility] ENDOSCOPY;  Service: Gastroenterology;;   BIOPSY  11/12/2022   Procedure: BIOPSY;  Surgeon: Lemar Lofty., MD;  Location: [facility] ENDOSCOPY;  Service: Gastroenterology;;   BIOPSY  12/05/2022   Procedure: BIOPSY;  Surgeon: [name], MD;  Location: [facility] ENDOSCOPY;  Service: Gastroenterology;;   COLONOSCOPY WITH PROPOFOL N/A 10/14/2021   Procedure: COLONOSCOPY WITH PROPOFOL;  Surgeon: Sherrilyn Rist, MD;  Location: [facility] ENDOSCOPY;  Service: Gastroenterology;  Laterality: N/A;   DIALYSIS/PERMA CATHETER INSERTION N/A 04/03/2023   Procedure: DIALYSIS/PERMA CATHETER INSERTION;  Surgeon: Leonie Douglas, MD;  Location: [facility] INVASIVE CV LAB;  Service: Cardiovascular;  Laterality: N/A;   ESOPHAGOGASTRODUODENOSCOPY N/A 11/12/2022   Procedure: ESOPHAGOGASTRODUODENOSCOPY (EGD);  Surgeon: Lemar Lofty., MD;  Location: [facility] ENDOSCOPY;  Service: Gastroenterology;  Laterality: N/A;   ESOPHAGOGASTRODUODENOSCOPY N/A 12/05/2022   Procedure: ESOPHAGOGASTRODUODENOSCOPY (EGD);  Surgeon: [name], MD;  Location: [facility] ENDOSCOPY;  Service: Gastroenterology;  Laterality: N/A;   ESOPHAGOGASTRODUODENOSCOPY (EGD) WITH PROPOFOL N/A 06/06/2022   Procedure: ESOPHAGOGASTRODUODENOSCOPY (EGD) WITH PROPOFOL;  Surgeon: Napoleon Form, MD;  Location: [facility] ENDOSCOPY;  Service: Gastroenterology;  Laterality: N/A;   FISTULA SUPERFICIALIZATION Right 11/07/2022   Procedure: PLICATION OF LARGE ANEURYSMS OF RIGHT ARTERIOVENOUS FISTULA WITH INSERTION OF 7mm INTERPOSITIONAL GORTEX GRAFT;  Surgeon:  Chuck Hint, MD;  Location: [facility] OR;  Service: Vascular;  Laterality: Right;   FLEXIBLE SIGMOIDOSCOPY N/A 04/14/2022   Procedure: FLEXIBLE SIGMOIDOSCOPY;  Surgeon: Napoleon Form, MD;  Location: [facility] ENDOSCOPY;  Service: Gastroenterology;  Laterality: N/A;   INSERTION OF DIALYSIS CATHETER N/A 11/07/2022   Procedure: INSERTION OF RIGHT INTERNAL JUGULAR TUNNELED DIALYSIS CATHETER;  Surgeon: Chuck Hint, MD;  Location: [facility] OR;  Service: Vascular;  Laterality: N/A;   LIGATION OF ARTERIOVENOUS  FISTULA Left 03/06/2019   Procedure: LIGATION OF ARTERIOVENOUS  FISTULA;  Surgeon: Cephus Shelling, MD;  Location: [facility] OR;  Service: Vascular;  Laterality: Left;   PERIPHERAL VASCULAR BALLOON ANGIOPLASTY  10/27/2022   Procedure: PERIPHERAL VASCULAR BALLOON ANGIOPLASTY;  Surgeon: Chuck Hint, MD;  Location: [facility] INVASIVE CV LAB;  Service: Cardiovascular;;  rt upper arm fistula   SAVORY DILATION N/A 11/12/2022   Procedure: SAVORY DILATION;  Surgeon: Meridee Score Netty Starring., MD;  Location: [facility] ENDOSCOPY;  Service: Gastroenterology;  Laterality: N/A;   spinal tap     SPINE SURGERY  related to a spinal infection, unsure of surgery or infection source   TRANSESOPHAGEAL ECHOCARDIOGRAM (CATH LAB) N/A 04/02/2023   Procedure: TRANSESOPHAGEAL ECHOCARDIOGRAM;  Surgeon: Jake Bathe, MD;  Location: [facility] INVASIVE CV LAB;  Service: Cardiovascular;  Laterality: N/A;   WISDOM TOOTH EXTRACTION       Social History:   reports that he has been smoking cigarettes. He has never used smokeless tobacco. He reports that he does not currently use alcohol. He reports current drug use. Drug: Marijuana.   Family History:  His family history includes Asthma in his son; Diabetes in his mother; Hearing loss in his father; Heart disease in his maternal grandmother; Hypertension in his mother and sister; Kidney failure in his mother; Stroke in his maternal grandfather.   Allergies Allergies  Allergen  Reactions   Zestril [Lisinopril] Swelling   Nsaids Other (See Comments)    "Kidney problems "   Risperdal [Risperidone] Other (See Comments)    Unknown reaction      Home Medications  Prior to Admission medications   Medication Sig Start Date End Date Taking? Authorizing Provider  calcium acetate (PHOSLO) 667 MG capsule Take 1 capsule (667 mg total) by mouth 3 (three) times daily with meals. 01/04/22   Vonna Drafts, MD  carvedilol (COREG) 25 MG tablet Take 1 tablet (25 mg total) by mouth 2 (two) times daily with a meal. 04/04/23   [name], [name]  divalproex (DEPAKOTE ER) 500 MG 24 hr tablet Take 1 tablet (500 mg total) by mouth daily. 04/04/23   [name], [name]  famotidine (PEPCID) 20 MG tablet Take 1 tablet (20 mg total) by mouth daily as needed for indigestion or heartburn. 04/04/23   [name], [name]  hydrALAZINE (APRESOLINE) 100 MG tablet Take 1 tablet (100 mg total) by mouth 3 (three) times daily. 04/04/23   [name], [name]  losartan (COZAAR) 100 MG tablet Take 1 tablet (100 mg total) by mouth daily. 04/04/23   [name], [name]  minoxidil (LONITEN) 10 MG tablet Take 0.5 tablets (5 mg total) by mouth 2 (two) times daily. 04/04/23   [name], [name]  NIFEdipine (ADALAT CC) 30 MG 24 hr tablet Take 1 tablet (30 mg total) by mouth daily. 04/04/23   [name], [name]  pantoprazole (PROTONIX) 40 MG tablet Take 1 tablet (40 mg total) by mouth daily. Patient not taking: Reported on 04/06/2023 04/04/23   [name], [name]  tiZANidine (ZANAFLEX) 2 MG tablet Take 1 tablet (2 mg total) by mouth every 6 (six) hours as needed for muscle spasms. 04/04/23   [name], [name]     Critical care time: 35 min

## 2023-05-09 NOTE — Assessment & Plan Note (Signed)
Flat troponin 90>91 and BNP >4500 in setting of volume overload.  Cardiomegaly seen on CXR.  EKG without ischemic changes and satting well on room air.  Noted effusion w/o tamponade on 11/21 during last admission.  Did not follow up with recommended outpatient echo.  -TTE to be updated this admission -Consult Cardiology based on findings

## 2023-05-09 NOTE — Progress Notes (Signed)
Patient removing essential equipment, refusing assistance to the restroom.

## 2023-05-09 NOTE — Assessment & Plan Note (Addendum)
ESRD d/t Alport disease.  HD received overnight.  Last outpatient HD Tuesday 12/24 and missed subsequent appointments due to holidays and transportation issues. Headache, dyspnea, pulmonary edema, pain likely sequelae of volume overload with markedly elevated BNP.  Will likely need repeat HD today. -Renal diet w/ 1200 mL fluid restriction -Caution with renally metabolized drugs -SubQ heparin for VTE PPx -AM RFP and Mag -Follow nephrology recommendations, appreciate assistance -Closely follow pulmonary status for evolving infection

## 2023-05-09 NOTE — Assessment & Plan Note (Addendum)
Patient now admitted to ICU.  Follow-up symptoms of diarrhea once stabilized and back to floor.

## 2023-05-09 NOTE — Progress Notes (Signed)
Patient removing tele leads/essential equipment. Getting up without assistance to the bathroom.    Refused education regarding safety while ambulating.   Refusing bed alarm.

## 2023-05-09 NOTE — Progress Notes (Signed)
Patient refused assistance to the restroom.

## 2023-05-09 NOTE — Progress Notes (Signed)
Patient safely transported to HD.

## 2023-05-09 NOTE — ED Notes (Signed)
Report att at this time

## 2023-05-09 NOTE — Progress Notes (Signed)
Cousin/Emergency contact, Trista call returned. Update given.

## 2023-05-09 NOTE — Progress Notes (Signed)
Daily Progress Note Intern Pager: [phone]  Patient name: [name] Medical record number: [id number] Date of birth: 09/06/1985 Age: 37 y.o. Gender: male  Primary Care Provider: [name], DO Consultants: Nephrology, PCCM Code Status: Full  Pt Overview and Major Events to Date:  12/27-admitted  Assessment and Plan:  [name] is a 37 y.o. male presenting with dyspnea and headache found to be profoundly hypertensive and volume overloaded secondary to missed dialysis and difficult medication adherence. Pertinent PMH/PSH includes Alport syndrome, bilateral sensorineural hearing loss, ESRD on HD, secondary hyperparathyroidism, CHF with EF 55-60%, bipolar 1, GERD, resistant hypertension, anemia of chronic disease. Assessment & Plan Hypertensive emergency Persistently elevated to 200s/100s this a.m. status post hydralazine, labetalol, losartan, minoxidil, nitroglycerin infusion, and one session of HD.  Unfortunately was only taking hydralazine at home.  Likely secondary to ESRD.  PCCM consulted and admitted patient to ICU on nicardipine drip for further BP control. -Follow PCCM recommendations, appreciate assistance -Continuous cardiac monitoring -PT/OT eval and treat -Goal reduction in SBP of 25% from ~220/150 in first 24 hours ESRD needing dialysis  Volume overload ESRD d/t Alport disease.  HD received overnight.  Last outpatient HD Tuesday 12/24 and missed subsequent appointments due to holidays and transportation issues. Headache, dyspnea, pulmonary edema, pain likely sequelae of volume overload with markedly elevated BNP.  Will likely need repeat HD today. -Renal diet w/ 1200 mL fluid restriction -Caution with renally metabolized drugs -SubQ heparin for VTE PPx -AM RFP and Mag -Follow nephrology recommendations, appreciate assistance -Closely follow pulmonary status for evolving infection Moderate pericardial effusion  Chest pain Flat troponin 90>91 and BNP >4500  in setting of volume overload.  Cardiomegaly seen on CXR.  EKG without ischemic changes and satting well on room air.  Noted effusion w/o tamponade on 11/21 during last admission.  Did not follow up with recommended outpatient echo.  -TTE to be updated this admission -Consult Cardiology based on findings Diarrhea Patient now admitted to ICU.  Follow-up symptoms of diarrhea once stabilized and back to floor.  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  Subjective:  Still is having a headache today.  He is also very hot and sweaty.  Objective: Temp:  [97.4 F (36.3 C)-98.8 F (37.1 C)] 97.9 F (36.6 C) (12/28 0402) Pulse Rate:  [78-98] 90 (12/28 0530) Resp:  [14-27] 20 (12/28 7846) BP: (199-242)/(109-169) 219/109 (12/28 0607) SpO2:  [91 %-100 %] 98 % (12/28 9629) Weight:  [83 kg] 83 kg (12/27 1830) Physical Exam: General: Lying in bed intermittently sitting up due to feeling hot, no acute distress, profound difficulty hearing Cardiovascular: Regular rate and rhythm without murmurs rubs or gallops Respiratory: Mild coarse breath sounds bilateral lung bases, breathing and speaking comfortably on room air Extremities: Moves all extremities grossly equally  Laboratory: Most recent CBC Lab Results  Component Value Date   WBC 4.9 05/08/2023   HGB 11.1 (L) 05/08/2023   HCT 33.5 (L) 05/08/2023   MCV 84.0 05/08/2023   PLT 141 (L) 05/08/2023   Most  recent BMP    Latest Ref Rng & Units 05/08/2023    5:55 PM  BMP   Glucose 70 - 99 mg/dL 95   BUN 6 - 20 mg/dL 98   Creatinine 4.09 - 1.24 mg/dL 81.19   Sodium 147 - 829 mmol/L 126   Potassium 3.5 - 5.1 mmol/L 4.8   Chloride 98 - 111 mmol/L 85   CO2 22 - 32 mmol/L 16   Calcium 8.9 - 10.3 mg/dL 9.2    Imaging/Diagnostic Tests: CT head without contrast IMPRESSION: 1. No acute intracranial abnormality. Negative non contrast CT appearance of the brain aside from subtle chronic white matter changes. 2. Recurrent mild right mastoid effusion, similar to an MRI in May and significance doubtful.  Evette Georges, MD 05/09/2023, 7:40 AM  PGY-2, [facility] FPTS Intern pager: [phone], text pages welcome Secure chat group [facility] Teaching Service

## 2023-05-09 NOTE — ED Notes (Signed)
Report given to ICU at this time.

## 2023-05-09 NOTE — Progress Notes (Signed)
Patient refused to give urine sample

## 2023-05-09 NOTE — Consult Note (Signed)
[facility] Renal Consultation Note  Indication for Consultation:  Management of ESRD/hemodialysis; anemia, hypertension/volume and secondary hyperparathyroidism  HPI: [name] is a 37 y.o. male with ESRD on chronic HD TTS, history of chronic noncompliance signing off HD early, entire month of December never stayed entire 4 hours last several treatments averaging only 2 hours to 2-1/2.  Last HD was 12/23 claiming transportation problems.  Noted history of Alport syndrome with some hearing loss(does read lips), hypertension AV fistula infection currently using TDC.  Other problems as listed below. Presented to ER complaining of shortness of breath headache, ear pain cough initial BP noted 215/157 heart rate 88 creatinine 21.01 BUN 98K 4.6 NA 126 chest x-ray showing bilateral pulm edema cardiomegaly.  He did get emergent hemolast night 6 L UF.  CT scan of head good for acute findings chronic right mastoid effusion prior imaging.  Started on nitro in drip PCCM seeing moved to ICU now.  Continues to have some shortness of breath.  Today is his normal day TTS will plan for dialysis now for UF and help BP.Marland Kitchen      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   Heart murmur    Hypertension    Low blood sugar    Marijuana abuse    Noncompliance    Pericardial effusion 03/28/2023   Tobacco abuse     Past Surgical History:  Procedure Laterality Date   A/V FISTULAGRAM Right 10/27/2022   Procedure: A/V Fistulagram;  Surgeon: Chuck Hint, MD;  Location: [facility] INVASIVE CV LAB;  Service: Cardiovascular;  Laterality: Right;   APPENDECTOMY     AV FISTULA PLACEMENT Left  03/03/2019   Procedure: ARTERIOVENOUS (AV) FISTULA CREATION LEFT ARM;  Surgeon: Maeola Harman, MD;  Location: [facility] OR;  Service: Vascular;  Laterality: Left;   BASCILIC VEIN TRANSPOSITION Right 04/12/2019   Procedure: RIGHT UPPER EXTREMITY BASCILIC VEIN TRANSPOSITION FIRST STAGE FISTULA;  Surgeon: Chuck Hint, MD;  Location: [facility] OR;  Service: Vascular;  Laterality: Right;   BIOPSY  10/14/2021   Procedure: BIOPSY;  Surgeon: Sherrilyn Rist, MD;  Location: [facility] ENDOSCOPY;  Service: Gastroenterology;;   BIOPSY  06/06/2022   Procedure: BIOPSY;  Surgeon: Napoleon Form, MD;  Location: [facility] ENDOSCOPY;  Service: Gastroenterology;;   BIOPSY  11/12/2022   Procedure: BIOPSY;  Surgeon: Lemar Lofty., MD;  Location: [facility] ENDOSCOPY;  Service: Gastroenterology;;   BIOPSY  12/05/2022   Procedure: BIOPSY;  Surgeon: [name], MD;  Location: [facility] ENDOSCOPY;  Service: Gastroenterology;;   COLONOSCOPY WITH PROPOFOL N/A 10/14/2021   Procedure: COLONOSCOPY WITH PROPOFOL;  Surgeon: Sherrilyn Rist, MD;  Location: [facility] ENDOSCOPY;  Service: Gastroenterology;  Laterality: N/A;   DIALYSIS/PERMA CATHETER INSERTION N/A 04/03/2023   Procedure: DIALYSIS/PERMA CATHETER INSERTION;  Surgeon: Leonie Douglas, MD;  Location: [facility] INVASIVE CV LAB;  Service: Cardiovascular;  Laterality: N/A;   ESOPHAGOGASTRODUODENOSCOPY N/A 11/12/2022   Procedure: ESOPHAGOGASTRODUODENOSCOPY (EGD);  Surgeon: Lemar Lofty., MD;  Location: [facility] ENDOSCOPY;  Service: Gastroenterology;  Laterality: N/A;   ESOPHAGOGASTRODUODENOSCOPY N/A  12/05/2022   Procedure: ESOPHAGOGASTRODUODENOSCOPY (EGD);  Surgeon: [name], MD;  Location: [facility] ENDOSCOPY;  Service: Gastroenterology;  Laterality: N/A;   ESOPHAGOGASTRODUODENOSCOPY (EGD) WITH PROPOFOL N/A 06/06/2022   Procedure: ESOPHAGOGASTRODUODENOSCOPY (EGD) WITH PROPOFOL;  Surgeon: Napoleon Form, MD;  Location: [facility] ENDOSCOPY;  Service: Gastroenterology;  Laterality:  N/A;   FISTULA SUPERFICIALIZATION Right 11/07/2022   Procedure: PLICATION OF LARGE ANEURYSMS OF RIGHT ARTERIOVENOUS FISTULA WITH INSERTION OF 7mm INTERPOSITIONAL GORTEX GRAFT;  Surgeon: Chuck Hint, MD;  Location: [facility] OR;  Service: Vascular;  Laterality: Right;   FLEXIBLE SIGMOIDOSCOPY N/A 04/14/2022   Procedure: FLEXIBLE SIGMOIDOSCOPY;  Surgeon: Napoleon Form, MD;  Location: [facility] ENDOSCOPY;  Service: Gastroenterology;  Laterality: N/A;   INSERTION OF DIALYSIS CATHETER N/A 11/07/2022   Procedure: INSERTION OF RIGHT INTERNAL JUGULAR TUNNELED DIALYSIS CATHETER;  Surgeon: Chuck Hint, MD;  Location: [facility] OR;  Service: Vascular;  Laterality: N/A;   LIGATION OF ARTERIOVENOUS  FISTULA Left 03/06/2019   Procedure: LIGATION OF ARTERIOVENOUS  FISTULA;  Surgeon: Cephus Shelling, MD;  Location: [facility] OR;  Service: Vascular;  Laterality: Left;   PERIPHERAL VASCULAR BALLOON ANGIOPLASTY  10/27/2022   Procedure: PERIPHERAL VASCULAR BALLOON ANGIOPLASTY;  Surgeon: Chuck Hint, MD;  Location: [facility] INVASIVE CV LAB;  Service: Cardiovascular;;  rt upper arm fistula   SAVORY DILATION N/A 11/12/2022   Procedure: SAVORY DILATION;  Surgeon: Meridee Score Netty Starring., MD;  Location: [facility] ENDOSCOPY;  Service: Gastroenterology;  Laterality: N/A;   spinal tap     SPINE SURGERY     related to a spinal infection, unsure of surgery or infection source   TRANSESOPHAGEAL ECHOCARDIOGRAM (CATH LAB) N/A 04/02/2023   Procedure: TRANSESOPHAGEAL ECHOCARDIOGRAM;  Surgeon: Jake Bathe, MD;  Location: [facility] INVASIVE CV LAB;  Service: Cardiovascular;  Laterality: N/A;   WISDOM TOOTH EXTRACTION        Family History  Problem Relation Age of Onset   Hypertension Mother    Kidney failure Mother    Diabetes Mother    Hearing loss Father    Hypertension Sister    Heart disease Maternal Grandmother    Stroke Maternal Grandfather    Asthma Son       reports that he has been smoking cigarettes. He has never  used smokeless tobacco. He reports that he does not currently use alcohol. He reports current drug use. Drug: Marijuana.   Allergies  Allergen Reactions   Zestril [Lisinopril] Swelling   Nsaids Other (See Comments)    "Kidney problems "   Risperdal [Risperidone] Other (See Comments)    Unknown reaction     Prior to Admission medications   Medication Sig Start Date End Date Taking? Authorizing Provider  calcium acetate (PHOSLO) 667 MG capsule Take 1 capsule (667 mg total) by mouth 3 (three) times daily with meals. 01/04/22   Vonna Drafts, MD  carvedilol (COREG) 25 MG tablet Take 1 tablet (25 mg total) by mouth 2 (two) times daily with a meal. 04/04/23   [name], DO  divalproex (DEPAKOTE ER) 500 MG 24 hr tablet Take 1 tablet (500 mg total) by mouth daily. 04/04/23   [name], DO  famotidine (PEPCID) 20 MG tablet Take 1 tablet (20 mg total) by mouth daily as needed for indigestion or heartburn. 04/04/23   [name], DO  hydrALAZINE (APRESOLINE) 100 MG tablet Take 1 tablet (100 mg total) by mouth 3 (three) times daily. 04/04/23   [name], DO  losartan (COZAAR) 100 MG tablet Take 1 tablet (100  mg total) by mouth daily. 04/04/23   [name], DO  minoxidil (LONITEN) 10 MG tablet Take 0.5 tablets (5 mg total) by mouth 2 (two) times daily. 04/04/23   [name], DO  NIFEdipine (ADALAT CC) 30 MG 24 hr tablet Take 1 tablet (30 mg total) by mouth daily. 04/04/23   [name], DO  pantoprazole (PROTONIX) 40 MG tablet Take 1 tablet (40 mg total) by mouth daily. Patient not taking: Reported on 04/06/2023 04/04/23   [name], DO  tiZANidine (ZANAFLEX) 2 MG tablet Take 1 tablet (2 mg total) by mouth every 6 (six) hours as needed for muscle spasms. 04/04/23   [name], DO      Results for orders placed or performed during the hospital encounter of 05/08/23 (from the past 48 hours)  Brain natriuretic peptide     Status: Abnormal   Collection Time: 05/08/23  5:33 PM  Result  Value Ref Range   B Natriuretic Peptide >4,500.0 (H) 0.0 - 100.0 pg/mL    Comment: Performed at [facility], [address] [place], [place] [address]  CBC with Differential     Status: Abnormal   Collection Time: 05/08/23  5:55 PM  Result Value Ref Range   WBC 4.9 4.0 - 10.5 K/uL   RBC 3.99 (L) 4.22 - 5.81 MIL/uL   Hemoglobin 11.1 (L) 13.0 - 17.0 g/dL   HCT 81.1 (L) 91.4 - 78.2 %   MCV 84.0 80.0 - 100.0 fL   MCH 27.8 26.0 - 34.0 pg   MCHC 33.1 30.0 - 36.0 g/dL   RDW 95.6 (H) 21.3 - 08.6 %   Platelets 141 (L) 150 - 400 K/uL    Comment: REPEATED TO VERIFY   nRBC 0.0 0.0 - 0.2 %   Neutrophils Relative % 72 %   Neutro Abs 3.5 1.7 - 7.7 K/uL   Lymphocytes Relative 16 %   Lymphs Abs 0.8 0.7 - 4.0 K/uL   Monocytes Relative 9 %   Monocytes Absolute 0.4 0.1 - 1.0 K/uL   Eosinophils Relative 3 %   Eosinophils Absolute 0.1 0.0 - 0.5 K/uL   Basophils Relative 0 %   Basophils Absolute 0.0 0.0 - 0.1 K/uL   Immature Granulocytes 0 %   Abs Immature Granulocytes 0.02 0.00 - 0.07 K/uL    Comment: Performed at [facility], [address] [place], [place] [address]  Comprehensive metabolic panel     Status: Abnormal   Collection Time: 05/08/23  5:55 PM  Result Value Ref Range   Sodium 126 (L) 135 - 145 mmol/L   Potassium 4.8 3.5 - 5.1 mmol/L   Chloride 85 (L) 98 - 111 mmol/L   CO2 16 (L) 22 - 32 mmol/L   Glucose, Bld 95 70 - 99 mg/dL    Comment: Glucose reference range applies only to samples taken after fasting for at least 8 hours.   BUN 98 (H) 6 - 20 mg/dL   Creatinine, Ser 96.29 (H) 0.61 - 1.24 mg/dL   Calcium 9.2 8.9 - 52.8 mg/dL   Total Protein 6.2 (L) 6.5 - 8.1 g/dL   Albumin 3.1 (L) 3.5 - 5.0 g/dL   AST 21 15 - 41 U/L   ALT 12 0 - 44 U/L   Alkaline Phosphatase 88 38 - 126 U/L   Total Bilirubin 0.7 <1.2 mg/dL   GFR, Estimated 3 (L) >60 mL/min    Comment: (NOTE) Calculated using the CKD-EPI Creatinine Equation (2021)  Anion gap 25 (H) 5 - 15    Comment:  ELECTROLYTES REPEATED TO VERIFY Performed at [facility], [address] [place], [place] [address]   Troponin I (High Sensitivity)     Status: Abnormal   Collection Time: 05/08/23  5:55 PM  Result Value Ref Range   Troponin I (High Sensitivity) 90 (H) <18 ng/L    Comment: (NOTE) Elevated high sensitivity troponin I (hsTnI) values and significant  changes across serial measurements may suggest ACS but many other  chronic and acute conditions are known to elevate hsTnI results.  Refer to the "Links" section for chest pain algorithms and additional  guidance. Performed at [facility], [address] [place], [place] [address]   Magnesium     Status: Abnormal   Collection Time: 05/08/23  5:55 PM  Result Value Ref Range   Magnesium 2.5 (H) 1.7 - 2.4 mg/dL    Comment: Performed at [facility], [address] [place], [place] [address]  Phosphorus     Status: Abnormal   Collection Time: 05/08/23  5:55 PM  Result Value Ref Range   Phosphorus 9.2 (H) 2.5 - 4.6 mg/dL    Comment: Performed at [facility] Lab, [address] [place], [place] [address]  Resp panel by RT-PCR (RSV, Flu A&B, Covid) Anterior Nasal Swab     Status: None   Collection Time: 05/08/23  5:58 PM   Specimen: Anterior Nasal Swab  Result Value Ref Range   SARS Coronavirus 2 by RT PCR NEGATIVE NEGATIVE   Influenza A by PCR NEGATIVE NEGATIVE   Influenza B by PCR NEGATIVE NEGATIVE    Comment: (NOTE) The Xpert Xpress SARS-CoV-2/FLU/RSV plus assay is intended as an aid in the diagnosis of influenza from Nasopharyngeal swab specimens and should not be used as a sole basis for treatment. Nasal washings and aspirates are unacceptable for Xpert Xpress SARS-CoV-2/FLU/RSV testing.  Fact Sheet for Patients: BloggerCourse.com  Fact Sheet for Healthcare Providers: SeriousBroker.it  This test is not yet approved or cleared by the [place] FDA  and has been authorized for detection and/or diagnosis of SARS-CoV-2 by FDA under an Emergency Use Authorization (EUA). This EUA will remain in effect (meaning this test can be used) for the duration of the COVID-19 declaration under Section 564(b)(1) of the Act, 21 U.S.C. section 360bbb-3(b)(1), unless the authorization is terminated or revoked.     Resp Syncytial Virus by PCR NEGATIVE NEGATIVE    Comment: (NOTE) Fact Sheet for Patients: BloggerCourse.com  Fact Sheet for Healthcare Providers: SeriousBroker.it  This test is not yet approved or cleared by the [place] FDA and has been authorized for detection and/or diagnosis of SARS-CoV-2 by FDA under an Emergency Use Authorization (EUA). This EUA will remain in effect (meaning this test can be used) for the duration of the COVID-19 declaration under Section 564(b)(1) of the Act, 21 U.S.C. section 360bbb-3(b)(1), unless the authorization is terminated or revoked.  Performed at [facility], [address], [place], [place] [address]   Troponin I (High Sensitivity)     Status: Abnormal   Collection Time: 05/08/23  7:35 PM  Result Value Ref Range   Troponin I (High Sensitivity) 91 (H) <18 ng/L    Comment: (NOTE) Elevated high sensitivity troponin I (hsTnI) values and significant  changes across serial measurements may suggest ACS but many other  chronic and acute conditions are known to elevate hsTnI results.  Refer to the "Links"  section for chest pain algorithms and additional  guidance. Performed at [facility], [address], [place], [place] [address]   Hepatitis B surface antigen     Status: None   Collection Time: 05/08/23  9:14 PM  Result Value Ref Range   Hepatitis B Surface Ag NON REACTIVE NON REACTIVE    Comment: Performed at [facility], [address], [place], [place] [address]  Renal function panel     Status: Abnormal   Collection  Time: 05/09/23  8:36 AM  Result Value Ref Range   Sodium 130 (L) 135 - 145 mmol/L   Potassium 3.8 3.5 - 5.1 mmol/L   Chloride 93 (L) 98 - 111 mmol/L   CO2 21 (L) 22 - 32 mmol/L   Glucose, Bld 114 (H) 70 - 99 mg/dL    Comment: Glucose reference range applies only to samples taken after fasting for at least 8 hours.   BUN 50 (H) 6 - 20 mg/dL   Creatinine, Ser 09.81 (H) 0.61 - 1.24 mg/dL   Calcium 9.2 8.9 - 19.1 mg/dL   Phosphorus 6.5 (H) 2.5 - 4.6 mg/dL   Albumin 3.0 (L) 3.5 - 5.0 g/dL   GFR, Estimated 4 (L) >60 mL/min    Comment: (NOTE) Calculated using the CKD-EPI Creatinine Equation (2021)    Anion gap 16 (H) 5 - 15    Comment: Performed at [facility], [address], [place], [place] [address]  Magnesium     Status: None   Collection Time: 05/09/23  8:36 AM  Result Value Ref Range   Magnesium 1.9 1.7 - 2.4 mg/dL    Comment: Performed at [facility], [address], [place], [place] [address]  CBC     Status: Abnormal   Collection Time: 05/09/23  8:36 AM  Result Value Ref Range   WBC 4.7 4.0 - 10.5 K/uL   RBC 3.74 (L) 4.22 - 5.81 MIL/uL   Hemoglobin 10.5 (L) 13.0 - 17.0 g/dL   HCT 08.6 (L) 57.8 - 46.9 %   MCV 84.5 80.0 - 100.0 fL   MCH 28.1 26.0 - 34.0 pg   MCHC 33.2 30.0 - 36.0 g/dL   RDW 62.9 (H) 52.8 - 41.3 %   Platelets 148 (L) 150 - 400 K/uL   nRBC 0.0 0.0 - 0.2 %    Comment: Performed at [facility], [address] [place], [place] [address]  Brain natriuretic peptide     Status: Abnormal   Collection Time: 05/09/23  8:37 AM  Result Value Ref Range   B Natriuretic Peptide 4,476.4 (H) 0.0 - 100.0 pg/mL    Comment: Performed at [facility], [address], [place], [place] [address]  .  ROS: See HPI   Physical Exam: Vitals:   05/09/23 0900 05/09/23 0924  BP: (!) 203/134 (!) 177/101  Pulse: 94   Resp:    Temp:    SpO2: 97%      General: Young adult male, sitting in bed not in distress but complaining of some shortness of  breath O2 sat 97% HEENT: [place], EOMI, nonicteric, MMM Neck: Supple no JVD Heart: RRR no MRG Lungs: Faint basilar rales nonlabored breathing Abdomen: NABS soft NT ND Extremities: Trace pedal edema Skin: Warm and dry no overt rash or ulcers on exposed skin Neuro: Hard of hearing reads lips no acute focal deficits appreciated no asterixis Dialysis Access: Right IJ TDC, right upper arm AV  fistula surgical area  Dialysis Orders: Center: [facility], EDW 79.1    4-hour dialysis no heparin, 2K 2 calcium bath No heparin, calcitriol 1.25 mcg p.o. q. dialysis Mircera 150 mcg every 2 weeks last given 12/17.   Assessment/Plan HTN emergency with volume overload pulmonary edema secondary to missed HD questionable BP med compliance= HD on admit 6 L UF attempt again UF today normal /Cardene drip in ICU BP improving ESRD -HD TTS HD today is normal day(needs better compliance as outpatient standing full treatment, has been ongoing issue with counseling) History of pericardial effusion with elevated troponin seems to remain flat at 90 echo pending, no rub appreciated Anemia  -Hgb 10.5, follow-up Hgb trend for ESA if needed next due 05/12/2023 Metabolic bone disease -corrected calcium elevated hold vitamin D for now follow-up trend phosphorus 6.5 continue binder History of Alport syndrome with bilateral hearing loss  History of bipolar disease = meds per admit on Depakote   Lenny Pastel, PA-C [facility] Beeper [phone] 05/09/2023, 9:32 AM

## 2023-05-09 NOTE — Assessment & Plan Note (Addendum)
Persistently elevated to 200s/100s this a.m. status post hydralazine, labetalol, losartan, minoxidil, nitroglycerin infusion, and one session of HD.  Unfortunately was only taking hydralazine at home.  Likely secondary to ESRD.  PCCM consulted and admitted patient to ICU on nicardipine drip for further BP control. -Follow PCCM recommendations, appreciate assistance -Continuous cardiac monitoring -PT/OT eval and treat -Goal reduction in SBP of 25% from ~220/150 in first 24 hours

## 2023-05-09 NOTE — Progress Notes (Signed)
eLink Physician-Brief Progress Note Patient Name: [name] DOB: [birth year] MRN: [id number]   Date of Service  05/09/2023  HPI/Events of Note  HTN  eICU Interventions     Severe HTN ESRD  Switch Nicardipine to [employer] 05/09/2023, 9:30 PM

## 2023-05-09 NOTE — Plan of Care (Addendum)
FMTS Interim Progress Note  Pt to CT scanner  Still hypertensive >200/120 after HD, labetalol and oral hydral. Persistent headache per RN.   I have ordered a nitro gtt for when he arrives back from CT. I spoke with ED pharmacist who confirmed this does not need to be managed by critical care. Goal SBP for now is 175; please hold additional doses of labetalol, hydral, losartan, nifedipine, and coreg while on the drip   Nitroglycerin 0-100 mcg/min, start at 10mcg/min and titrate q 3 mins by 49mcg/min, for goal SBP of 175 - dosing confirmed w/ pharmacy  Update [id number]: Persistently hypertensive after starting nitroglycerin gtt, now on 61mcg/min. Discussed with on-call CCM consults provider, they will see the patient. Appreciate their recommendations.  Vonna Drafts, MD 05/09/2023, 4:41 AM PGY-2, [facility] Family Medicine Service pager [phone]

## 2023-05-09 NOTE — Plan of Care (Signed)

## 2023-05-09 NOTE — ED Notes (Signed)
Per providers give minoxidil and hydralazine and wait 30 minutes to see how patient does.

## 2023-05-09 NOTE — Plan of Care (Addendum)
FMTS Brief Progress Note  S: [name] is a 37 y.o. male with a history of Alport syndrome, ESRD on HD, BPD 1, bilateral sensorineural hearing loss, resistant HTN, and medication nonadherence presenting with intractable headache, dyspnea, chest pain and was admitted for hypertensive emergency and volume overload after missing HD.  Now s/p HD overnight, ~6.1 L removed.  Received message from RN stating patient arrived from HD with massive headache and BP 207/127, crying out in pain. Upon evaluation, patient is complaining of R sided headache, including in his ear.  Believes he has an ear infection, unclear why.  Vision still blurry.  Patient was able to sleep briefly after receiving Fentanyl and compazine, but awakens upon being approached.  O: BP (!) 209/127 (BP Location: Left Arm)   Pulse 84   Temp 98.8 F (37.1 C) (Oral)   Resp (!) 22   Ht 5\' 8"  (1.727 m)   Wt 83 kg   SpO2 100%   BMI 27.82 kg/m   General: Resting in hallway bed.  Initially asleep, but awakens and appears to me in moderate distress. Cardiovascular: Regular rate and rhythm. Normal S1/S2. No murmurs, rubs, or gallops appreciated. 2+ radial pulses. Neuro: Patient alert and oriented x4.  PERRLA, though pupils constricted s/p Fentanyl.  Speaking appropriately and able to follow instructions when they are heard, which is difficult due to hearing impairment.  Intact and equal strength bilaterally in all extremities.   A/P:  Hypertensive emergency No evidence of encephalopathy or cardiac sequelae.  Not clear that patient is having true hypertensive emergency based on Sx, but certainly at high risk for complications.  Regarding headache, favor exacerbation of chronic headaches rather than ICH or other intracranial pathology.  BP only mildly improved after HD.  Patient does have very high BP baseline, will use caution and favor gradual decrease. S/p labetalol 20 mg IV in HD already. -Released orders for home  antihypertensives: hydralazine 100 mg TID, losartan 100 mg daily, minoxidil 5 mg BID, nifedipine XL 30 mg daily, carvedilol 25 mg BID  -Will administer hydralazine first then reevaluate, do not want to cause sudden BP drop -Goal reduction in SBP of ~10% in first hour and ~25% from ~220/150 in first 24 hours -Will spot dose IV labetalol if not improved with orals -Final line of treatment will be consulting CCM and/or nitroglycerin drip if needed -CT head to evaluate for intracranial pathology  Headache Believe this to be exacerbated by BP, but not clearly caused by it.  Patient does have known history of intractable headaches.  Appears unimproved after HD. -Compazine 10 mg IV Q6h PRN -Fentanyl 50 mcg x1, will reevaluate and redose as needed  - Orders reviewed. Labs for AM ordered, which were adjusted as needed - If condition changes, plan includes adding PRN labetalol with SBP goal, then contacting CCM for nitroprusside drip if not improving  Sharion Dove, Uthman Mroczkowski, MD 05/09/2023, 2:45 AM PGY-1,  Family Medicine Night Resident  Please page [phone] with questions.

## 2023-05-10 ENCOUNTER — Inpatient Hospital Stay (HOSPITAL_COMMUNITY): Payer: Medicare Other

## 2023-05-10 DIAGNOSIS — I3139 Other pericardial effusion (noninflammatory): Secondary | ICD-10-CM

## 2023-05-10 DIAGNOSIS — I161 Hypertensive emergency: Secondary | ICD-10-CM | POA: Diagnosis not present

## 2023-05-10 LAB — RESPIRATORY PANEL BY PCR

## 2023-05-10 LAB — ECHOCARDIOGRAM LIMITED
Area-P 1/2: 5.62 cm2
Height: 68 in
S' Lateral: 4.2 cm
Weight: 2670.21 [oz_av]

## 2023-05-10 MED ORDER — TIZANIDINE HCL 2 MG PO TABS
2.0000 mg | ORAL_TABLET | Freq: Three times a day (TID) | ORAL | Status: DC | PRN
  Administered 2023-05-10: 2 mg via ORAL
  Filled 2023-05-10: qty 1

## 2023-05-10 MED ORDER — MINOXIDIL 10 MG PO TABS
10.0000 mg | ORAL_TABLET | Freq: Two times a day (BID) | ORAL | Status: DC
  Administered 2023-05-11 – 2023-05-13 (×7): 10 mg via ORAL
  Filled 2023-05-10 (×10): qty 1

## 2023-05-10 MED ORDER — NIFEDIPINE ER OSMOTIC RELEASE 60 MG PO TB24
60.0000 mg | ORAL_TABLET | Freq: Every day | ORAL | Status: DC
  Administered 2023-05-11 – 2023-05-13 (×3): 60 mg via ORAL
  Filled 2023-05-10 (×4): qty 1

## 2023-05-10 MED ORDER — MINOXIDIL 2.5 MG PO TABS
5.0000 mg | ORAL_TABLET | Freq: Once | ORAL | Status: AC
  Administered 2023-05-10: 5 mg via ORAL
  Filled 2023-05-10: qty 2

## 2023-05-10 MED ORDER — NIFEDIPINE ER OSMOTIC RELEASE 30 MG PO TB24
30.0000 mg | ORAL_TABLET | Freq: Once | ORAL | Status: AC
  Administered 2023-05-10: 30 mg via ORAL
  Filled 2023-05-10: qty 1

## 2023-05-10 NOTE — Progress Notes (Signed)
eLink Physician-Brief Progress Note Patient Name: [name] DOB: [birth year] MRN: [id number]   Date of Service  05/10/2023  HPI/Events of Note  Home meds  eICU Interventions        Was on Zanaflex at home We will resume  [name] 05/10/2023, 9:53 PM

## 2023-05-10 NOTE — Progress Notes (Signed)
Patient has repetitively taken [place] off while sleeping despite education on the importance of wearing it while he sleeps.

## 2023-05-10 NOTE — Progress Notes (Signed)
°  Echocardiogram 2D Echocardiogram has been performed.  [name] 05/10/2023, 4:05 PM

## 2023-05-10 NOTE — Plan of Care (Signed)
°  Problem: Health Behavior/Discharge Planning: Goal: Ability to manage health-related needs will improve Outcome: Progressing   Problem: Clinical Measurements: Goal: Will remain free from infection Outcome: Progressing   Problem: Activity: Goal: Risk for activity intolerance will decrease Outcome: Progressing   Problem: Coping: Goal: Level of anxiety will decrease Outcome: Progressing   Problem: Safety: Goal: Ability to remain free from injury will improve Outcome: Progressing   Problem: Skin Integrity: Goal: Risk for impaired skin integrity will decrease Outcome: Progressing

## 2023-05-10 NOTE — Hospital Course (Signed)
[name] is a 37 y.o. male who was admitted to the [facility] Medicine Teaching Service at [facility] for volume overload and elevated Bps secondary to poor HD compliance. Hospital course is outlined below by problem.   Volume overload  Hemodialysis Patient was initially admitted to the ICU for severe volume overload with pulmonary edema. He had 10 L pulled off via ultrafiltrate with HD. After two days of dialysis he no longer required oxygen support. He was transferred to the floor and continued to have dialysis treatment. ***  Hypertensive Emergency w/ history of HEFmrEF  Other conditions that were chronic and stable: ***  Issues for follow up: ***

## 2023-05-10 NOTE — Consult Note (Signed)
NAME:  [name], MRN:  [id number], DOB:  10-Jul-1985, LOS: 2 ADMISSION DATE:  05/08/2023, CONSULTATION DATE:  12/28 REFERRING MD:  Jennette Kettle, CHIEF COMPLAINT:  hypertensive emergency    History of Present Illness:  37 year old male w/ hx as outlined below. Presented to ER 12/27 w/ cc: SOB, HA, visual change, ear pain and cough. Initial BP 215/157 (175)HR 88. Trop I 90->91, RVP neg, cr 21.01, bun 98, k 4.6, Na 126, CXR CM w/ bilateral edema  Missed two rounds of home iHD. Nephro consulted in ER. Got emergent iHD net neg ~6 liters in addition to antihypertensives.   On 12/28 Pt still w/ HA and BP 207/127 (154) w/ right sided HA and ear pain.  Had received several PRN antihypertensives in addition to oral rx started. Remained hypertensive so NTG gtt started. Orals stopped (out of concern for too rapid decline in BP while on Gtt?? )  Went to CT scan for on-going head ache. His CT head was neg for acute findings. Had chronic right mastoid effusion noted on prior imaging.  As he was placed on NGT gtt PCCM consulted.     Pertinent  Medical History  Alport syndrome, ESRD (MWF), BPD1, bilateral sensorineural hearing loss, anemia of CKD, secondary hyperparathyroidism.  Recent hospital stay for sepsis, felt AV fistula infection  Mod pericardial effusion   Significant Hospital Events: Including procedures, antibiotic start and stop dates in addition to other pertinent events   12/27 presented w/ hypertensive emergency initial MAP 175. Treated w/ antihypertensives and emergent iHD. ~ 6 liters off 12/27 started on Ntg gtt for MAP still in 150s. CT head neg for acute findings. PCCM consulted for hypertensive emergency  05/09/2023: Chest x-ray consistent with pulmonary edema although developing right sided infiltrates more than left.  Interim History / Subjective:   12/29 -per Dr. Glenna Fellows nephrology patient's baseline blood pressure in the dialysis unit run systolic blood pressure 200.  He is  habitually noncompliant with dialysis.  Cut his dialysis short.  This admission she is already done 10 L ultrafiltration removal.  He currently remains on Cleviprex + on oral carvedilol, hydralazine, losartan, minoxidil and nifedipine currently. SBO 140s currently. On room air 3L [employer]      Objective   Blood pressure (!) 151/88, pulse (!) 108, temperature 99.1 F (37.3 C), temperature source Axillary, resp. rate 20, height 5\' 8"  (1.727 m), weight 75.7 kg, SpO2 93%.        Intake/Output Summary (Last 24 hours) at 05/10/2023 0953 Last data filed at 05/10/2023 0600 Gross per 24 hour  Intake 1125.7 ml  Output 4000 ml  Net -2874.3 ml   Filed Weights   05/08/23 1830 05/09/23 1522 05/10/23 0400  Weight: 83 kg 82.7 kg 75.7 kg    Examination: General: 37 year old male patient sitting up in bed.  He is fatigued, short of breath, unable to lay flat HENT: Normocephalic atraumatic pupils are equal and reactive no JVD mucous membranes moist, very hard of hearing Lungs: Faint basilar rales, some occasional rhonchi.  Has brief episodes of apnea, received fentanyl at around 3 AM as well as Compazine currently pulse oximetry and 100% Cardiovascular: Regular rhythm no murmur Abdomen: Soft not tender Extremities: Warm dry no significant edema Neuro: Awake oriented x 3 but drifts to sleep quickly GU: An uric   General Appearance:  Looks beter Head:  Normocephalic, without obvious abnormality, atraumatic Eyes:  PERRL - yes, conjunctiva/corneas - mudduy     Ears:  Normal external ear  canals, both ears Nose:  G tube - no Throat:  ETT TUBE - no , OG tube - no Neck:  Supple,  No enlargement/tenderness/nodules Lungs: Clear to auscultation bilaterally, but there is RLL CRACKLES ->  Heart:  S1 and S2 normal, no murmur, CVP - no.  Pressors - no. ON CLEVIPREX Abdomen:  Soft, no masses, no organomegaly Genitalia / Rectal:  Not done Extremities:  Extremities- intact Skin:  ntact in exposed areas . Sacral  area - nega Neurologic:  Sedation - none -> RASS - +1 . Moves all 4s - yes. CAM-ICU - neg . Orientation - x3+     Resolved Hospital Problem list     Assessment & Plan:  Hypertensive emergency with history of HFmrEF secondary to nonadherence of both his medications and dialysis.  Presented with chest pain shortness of breath and headache -Initial systolic blood pressure 215, mean arterial pressure 175.  Remains fairly hypertensive postdialysis however really has not received much in the way of therapy medication wise.  05/10/2023: Systolic blood pressure 140 with Cleviprex and at least for oral medications.  Dr. Glenna Fellows reports systolic blood pressure baseline 200  Plan Continue oral medications but also escalate dosing on hydralazine and nifedipine Wean Cleviprex off Systolic blood pressure goal 1 85-200 based on blood   Acute resp fialure Dyspnea secondary to acute pulmonary edema from volume overload  12/29" 10 L removed so far.  On room air pulse ox 93%.  There are some right lower lobe crackles  Plan Pulse ox goal greater than 93% [has dark skin] Continue volume removal per renal Continue blood pressure control  Check RVP   History of pericardial effusion, has mild elevated troponin, but these have remained flat at 90.    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 Has h/o Migraines Suspect secondary to his hypertension.  I doubt that the nitroglycerin is helping, may be making it worse.  CT negative  12/29 - no heaache  Plan Continuing supportive care   Anemia of CKD Hemoglobin stable  Plan Monitor .- PRBC for hgb </= 6.9gm%    - exceptions are   -  if ACS susepcted/confirmed then transfuse for hgb </= 8.0gm%,  or    -  active bleeding with hemodynamic instability, then transfuse regardless of  hemoglobin value   At at all times try to transfuse 1 unit prbc as possible with exception of active hemorrhage    Alport syndrome with bilateral hearing loss  Plan Supportive care Talk without mask and use texting  History of bipolar disease type I Plan Continue Depakote  History of GERD Plan Continue H2 blocker  Diarrhea  12/29 - none reported   Plan Monitor May need to consider C. difficile evaluation Best Practice (right click and "Reselect all SmartList Selections" daily)   Diet/type: Regular consistency (see orders) DVT prophylaxis prophylactic heparin  Pressure ulcer(s): N/A GI prophylaxis: H2B Lines: N/A Foley:  N/A Code Status:  full code Last date of multidisciplinary goals of care discussion [pending]  12/29 - patient updated  DISPO: once off cleviprex acan move out of ICU  ATTESTATION & SIGNATURE   The patient FINNICK PERRILLOUX is critically ill with multiple organ systems failure and requires high complexity decision making for assessment and support, frequent evaluation and titration of therapies, application of advanced monitoring technologies and extensive interpretation of multiple  databases and discussion with other appropriate health care personnel such as bedside nurses, social workers, case Production designer, theatre/television/film, consultants, respiratory therapists, nutritionists, secretaries etc.,  Critical care time includes but is not restricted to just documentation time. Documentation can happen in parallel or sequential to care time depending on case mix urgency and priorities for the shift. So, overall critical Care Time devoted to patient care services described in this note is  30  Minutes.   This time reflects time of care of this signee Dr Kalman Shan which includ does not reflect procedure time, or teaching time or supervisory time of PA/NP/Med student/Med Resident etc but could involve care discussion time     Dr. Kalman Shan, M.D., [facility] Pulmonary and  Critical Care Medicine Staff Physician, [facility] Pager: [phone], If no answer or between  15:00h - 7:00h: call [phone]  05/10/2023 10:00 AM    LABS    PULMONARY No results for input(s): "PHART", "PCO2ART", "PO2ART", "HCO3", "TCO2", "O2SAT" in the last 168 hours.  Invalid input(s): "PCO2", "PO2"  CBC Recent Labs  Lab 05/08/23 1755 05/09/23 0836  HGB 11.1* 10.5*  HCT 33.5* 31.6*  WBC 4.9 4.7  PLT 141* 148*    COAGULATION No results for input(s): "INR" in the last 168 hours.  CARDIAC  No results for input(s): "TROPONINI" in the last 168 hours. No results for input(s): "PROBNP" in the last 168 hours.   CHEMISTRY Recent Labs  Lab 05/08/23 1755 05/09/23 0836  NA 126* 130*  K 4.8 3.8  CL 85* 93*  CO2 16* 21*  GLUCOSE 95 114*  BUN 98* 50*  CREATININE 21.01* 13.82*  CALCIUM 9.2 9.2  MG 2.5* 1.9  PHOS 9.2* 6.5*   Estimated Creatinine Clearance: 7.2 mL/min (A) (by C-G formula based on SCr of 13.82 mg/dL (H)).   LIVER Recent Labs  Lab 05/08/23 1755 05/09/23 0836  AST 21  --   ALT 12  --   ALKPHOS 88  --   BILITOT 0.7  --   PROT 6.2*  --   ALBUMIN 3.1* 3.0*     INFECTIOUS No results for input(s): "LATICACIDVEN", "PROCALCITON" in the last 168 hours.   ENDOCRINE CBG (last 3)  Recent Labs    05/09/23 1102  GLUCAP 91         IMAGING x48h  - image(s) personally visualized  -   highlighted in bold DG Chest Port 1 View Result Date: 05/09/2023 CLINICAL DATA:  37 year old male with history of shortness of breath. EXAM: PORTABLE CHEST 1 VIEW COMPARISON:  Chest x-ray 05/08/2023. FINDINGS: Right internal jugular PermCath with tip terminating near the superior cavoatrial junction. Lung volumes are normal. There is cephalization of the pulmonary vasculature and slight indistinctness of the interstitial markings suggestive of mild pulmonary edema. Focal opacity in the right mid lung increased in  prominence compared to the prior study. No pneumothorax. No pleural effusions. Heart size is moderately enlarged. Upper mediastinal contours are within normal limits. IMPRESSION: 1. The appearance of the chest is once again favored to reflect congestive heart failure and/or fluid overload, however, there is a more focal developing opacity in the right mid lung which could suggest infectious airspace consolidation. Clinical correlation for signs and symptoms of developing pneumonia is recommended. Electronically Signed   By: Trudie Reed M.D.   On: 05/09/2023 08:45   CT HEAD WO CONTRAST ( ) Result Date: 05/09/2023 CLINICAL DATA:  37 year old male with sudden severe  headache, missed dialysis yesterday. Cough and shortness of breath. EXAM: CT HEAD WITHOUT CONTRAST TECHNIQUE: Contiguous axial images were obtained from the base of the skull through the vertex without intravenous contrast. RADIATION DOSE REDUCTION: This exam was performed according to the departmental dose-optimization program which includes automated exposure control, adjustment of the mA and/or kV according to patient size and/or use of iterative reconstruction technique. COMPARISON:  Brain MRI 09/17/2022.  Head CT 03/27/2023. FINDINGS: Brain: Cerebral volume remains within normal limits. No midline shift, ventriculomegaly, mass effect, evidence of mass lesion, intracranial hemorrhage or evidence of cortically based acute infarction. Subtle white matter hypodensity is stable, otherwise gray-white differentiation is within normal limits. Vascular: No suspicious intracranial vascular hyperdensity. Skull: Intact, negative. Sinuses/Orbits: Recurrent right mastoid effusion, similar to the MRI in May. Right tympanic cavity is clear. Other Visualized paranasal sinuses and mastoids are stable and well aerated. Other: Adenoid hypertrophy, stable and appears in part related to pharyngeal retention cysts on the previous MRI. Mildly Disconjugate gaze.  Visualized scalp soft tissues are within normal limits. IMPRESSION: 1. No acute intracranial abnormality. Negative non contrast CT appearance of the brain aside from subtle chronic white matter changes. 2. Recurrent mild right mastoid effusion, similar to an MRI in May and significance doubtful. Electronically Signed   By: Odessa Fleming M.D.   On: 05/09/2023 04:51   DG Chest Portable 1 View Result Date: 05/08/2023 CLINICAL DATA:  Acute coronary syndrome. EXAM: PORTABLE CHEST 1 VIEW COMPARISON:  Touched radiographs on [id number], 01/05/2023, 01/04/2023 FINDINGS: Right internal jugular dual-lumen central venous catheter with tip overlying the superior vena cava/right atrial junction is unchanged from prior. Cardiac silhouette is again moderately enlarged. Mediastinal contours are within normal limits. There is mildly worsened interstitial thickening bilaterally. There is again cephalization of the pulmonary vasculature. No pleural effusion or pneumothorax.  No acute skeletal abnormality. IMPRESSION: Mildly worsened interstitial thickening bilaterally, likely worsened mild interstitial pulmonary edema. Electronically Signed   By: Neita Garnet M.D.   On: 05/08/2023 18:42

## 2023-05-10 NOTE — Evaluation (Signed)
Physical Therapy Evaluation Patient Details Name: [name] MRN: [id number] DOB: 07-20-1985 Today's Date: 05/10/2023  History of Present Illness  37 y.o. male presents to [facility] hospital on [facility]/08/2023 with dyspnea and HA, found to be profoundly hypertensive after missing dialysis. PMH includes Alport syndrome, ESRD, bipolar disorder, bilateral sensorineural hearing loss, anemia, hyperparathyroidism.  Clinical Impression  Pt presents to PT mobilizing very well. Pt is able to ambulate for community distances without DME or balance deviations. PT encourages the pt to mobilize frequently in an effort to improve activity tolerance further. Pt does not have further acute PT needs at this time, mobility specialist team will follow once out of the ICU. Acute PT signing off.       If plan is discharge home, recommend the following:     Can travel by private vehicle        Equipment Recommendations None recommended by PT  Recommendations for Other Services       Functional Status Assessment Patient has had a recent decline in their functional status and demonstrates the ability to make significant improvements in function in a reasonable and predictable amount of time.     Precautions / Restrictions Precautions Precautions: None Restrictions Weight Bearing Restrictions Per Provider Order: No      Mobility  Bed Mobility Overal bed mobility: Independent                  Transfers Overall transfer level: Independent Equipment used: None                    Ambulation/Gait Ambulation/Gait assistance: Independent Gait Distance (Feet): 800 Feet Assistive device: None Gait Pattern/deviations: WFL(Within Functional Limits) Gait velocity: functional Gait velocity interpretation: >2.62 ft/sec, indicative of community ambulatory   General Gait Details: steady step-through gait  Stairs            Wheelchair Mobility     Tilt Bed    Modified Rankin (Stroke  Patients Only)       Balance Overall balance assessment: Independent                                           Pertinent Vitals/Pain Pain Assessment Pain Assessment: No/denies pain    Home Living Family/patient expects to be discharged to:: Private residence Living Arrangements: Alone Available Help at Discharge:  (none identified) Type of Home: Apartment Home Access: Level entry       Home Layout: One level Home Equipment: Cane - single point      Prior Function Prior Level of Function : Independent/Modified Independent;Driving;Working/employed             Mobility Comments: pt reports he has progressed to ambulating without DME since last hospitalization       Extremity/Trunk Assessment   Upper Extremity Assessment Upper Extremity Assessment: Overall WFL for tasks assessed    Lower Extremity Assessment Lower Extremity Assessment: Overall WFL for tasks assessed    Cervical / Trunk Assessment Cervical / Trunk Assessment: Normal  Communication   Communication Communication: Hearing impairment Cueing Techniques: Visual cues  Cognition Arousal: Alert Behavior During Therapy: WFL for tasks assessed/performed Overall Cognitive Status: Within Functional Limits for tasks assessed  General Comments General comments (skin integrity, edema, etc.): VSS on RA    Exercises     Assessment/Plan    PT Assessment All further PT needs can be met in the next venue of care  PT Problem List Cardiopulmonary status limiting activity       PT Treatment Interventions      PT Goals (Current goals can be found in the Care Plan section)       Frequency       Co-evaluation               AM-PAC PT "6 Clicks" Mobility  Outcome Measure Help needed turning from your back to your side while in a flat bed without using bedrails?: None Help needed moving from lying on your back to  sitting on the side of a flat bed without using bedrails?: None Help needed moving to and from a bed to a chair (including a wheelchair)?: None Help needed standing up from a chair using your arms (e.g., wheelchair or bedside chair)?: None Help needed to walk in hospital room?: None Help needed climbing 3-5 steps with a railing? : None 6 Click Score: 24    End of Session   Activity Tolerance: Patient tolerated treatment well Patient left: in bed;with call bell/phone within reach;with bed alarm set Nurse Communication: Mobility status PT Visit Diagnosis: Other abnormalities of gait and mobility (R26.89)    Time: 7616-0737 PT Time Calculation (min) (ACUTE ONLY): 16 min   Charges:   PT Evaluation $PT Eval Low Complexity: 1 Low   PT General Charges $$ ACUTE PT VISIT: 1 Visit         Arlyss Gandy, PT, DPT Acute Rehabilitation Office [phone]   Arlyss Gandy 05/10/2023, 3:23 PM

## 2023-05-10 NOTE — Progress Notes (Signed)
Pt c/o impaired vision x2 days states it was better this am and became impaired again, difficult seeing tv. Informed. Dr Marchelle Gearing, thinks may be due to pt's increased b/p pressure. Will continue to monitor.

## 2023-05-10 NOTE — Progress Notes (Addendum)
[facility] Progress Note   Subjective:   Another 4L UF with HD yesterday completed 8pm. Was weaned to RA but desat to 89% overnight and is on 3L [place] currently. Off IV BP meds but remains BP 160-180s. Tm 99.1 overnight.  Denies f/c, +occasional cough, no runny nose or sore throat.   Objective Vitals:   05/10/23 0615 05/10/23 0630 05/10/23 0723 05/10/23 0751  BP: (!) 164/100 (!) 167/105  (!) 187/105  Pulse: (!) 107 (!) 108    Resp: 19 20    Temp:   99.1 F (37.3 C)   TempSrc:   Axillary   SpO2: 93% 93%    Weight:      Height:       Physical Exam General: well appearing sleeping, awakens easily Heart: tachycardic regular Lungs: coarse in bases but o/w clear  Abdomen: soft, nontender Extremities: no edema Dialysis Access:  [facility]  Additional Objective Labs: Basic Metabolic Panel: Recent Labs  Lab 05/08/23 1755 05/09/23 0836  NA 126* 130*  K 4.8 3.8  CL 85* 93*  CO2 16* 21*  GLUCOSE 95 114*  BUN 98* 50*  CREATININE 21.01* 13.82*  CALCIUM 9.2 9.2  PHOS 9.2* 6.5*   Liver Function Tests: Recent Labs  Lab 05/08/23 1755 05/09/23 0836  AST 21  --   ALT 12  --   ALKPHOS 88  --   BILITOT 0.7  --   PROT 6.2*  --   ALBUMIN 3.1* 3.0*   No results for input(s): "LIPASE", "AMYLASE" in the last 168 hours. CBC: Recent Labs  Lab 05/08/23 1755 05/09/23 0836  WBC 4.9 4.7  NEUTROABS 3.5  --   HGB 11.1* 10.5*  HCT 33.5* 31.6*  MCV 84.0 84.5  PLT 141* 148*   Blood Culture    Component Value Date/Time   SDES BLOOD SITE NOT SPECIFIED 04/02/2023 1339   SDES BLOOD SITE NOT SPECIFIED 04/02/2023 1339   SPECREQUEST  04/02/2023 1339    BOTTLES DRAWN AEROBIC AND ANAEROBIC Blood Culture results may not be optimal due to an inadequate volume of blood received in culture bottles   SPECREQUEST  04/02/2023 1339    BOTTLES DRAWN AEROBIC AND ANAEROBIC Blood Culture results may not be optimal due to an inadequate volume of blood received in culture bottles   CULT   04/02/2023 1339    NO GROWTH 5 DAYS Performed at [facility], [address] [place], [place] [address]    CULT  04/02/2023 1339    NO GROWTH 5 DAYS Performed at [facility], [address], [place], [place] [address]    REPTSTATUS 04/07/2023 FINAL 04/02/2023 1339   REPTSTATUS 04/07/2023 FINAL 04/02/2023 1339    Cardiac Enzymes: No results for input(s): "CKTOTAL", "CKMB", "CKMBINDEX", "TROPONINI" in the last 168 hours. CBG: Recent Labs  Lab 05/09/23 1102  GLUCAP 91   Iron Studies: No results for input(s): "IRON", "TIBC", "TRANSFERRIN", "FERRITIN" in the last 72 hours. @lablastinr3 @ Studies/Results: DG Chest Port 1 View Result Date: 05/09/2023 CLINICAL DATA:  37 year old male with history of shortness of breath. EXAM: PORTABLE CHEST 1 VIEW COMPARISON:  Chest x-ray 05/08/2023. FINDINGS: Right internal jugular PermCath with tip terminating near the superior cavoatrial junction. Lung volumes are normal. There is cephalization of the pulmonary vasculature and slight indistinctness of the interstitial markings suggestive of mild pulmonary edema. Focal opacity in the right mid lung increased in prominence compared to the prior study. No pneumothorax. No pleural effusions. Heart size is moderately  enlarged. Upper mediastinal contours are within normal limits. IMPRESSION: 1. The appearance of the chest is once again favored to reflect congestive heart failure and/or fluid overload, however, there is a more focal developing opacity in the right mid lung which could suggest infectious airspace consolidation. Clinical correlation for signs and symptoms of developing pneumonia is recommended. Electronically Signed   By: Trudie Reed M.D.   On: 05/09/2023 08:45   CT HEAD WO CONTRAST ( ) Result Date: 05/09/2023 CLINICAL DATA:  37 year old male with sudden severe headache, missed dialysis yesterday. Cough and shortness of breath. EXAM: CT HEAD WITHOUT CONTRAST TECHNIQUE: Contiguous  axial images were obtained from the base of the skull through the vertex without intravenous contrast. RADIATION DOSE REDUCTION: This exam was performed according to the departmental dose-optimization program which includes automated exposure control, adjustment of the mA and/or kV according to patient size and/or use of iterative reconstruction technique. COMPARISON:  Brain MRI 09/17/2022.  Head CT 03/27/2023. FINDINGS: Brain: Cerebral volume remains within normal limits. No midline shift, ventriculomegaly, mass effect, evidence of mass lesion, intracranial hemorrhage or evidence of cortically based acute infarction. Subtle white matter hypodensity is stable, otherwise gray-white differentiation is within normal limits. Vascular: No suspicious intracranial vascular hyperdensity. Skull: Intact, negative. Sinuses/Orbits: Recurrent right mastoid effusion, similar to the MRI in May. Right tympanic cavity is clear. Other Visualized paranasal sinuses and mastoids are stable and well aerated. Other: Adenoid hypertrophy, stable and appears in part related to pharyngeal retention cysts on the previous MRI. Mildly Disconjugate gaze. Visualized scalp soft tissues are within normal limits. IMPRESSION: 1. No acute intracranial abnormality. Negative non contrast CT appearance of the brain aside from subtle chronic white matter changes. 2. Recurrent mild right mastoid effusion, similar to an MRI in May and significance doubtful. Electronically Signed   By: Odessa Fleming M.D.   On: 05/09/2023 04:51   DG Chest Portable 1 View Result Date: 05/08/2023 CLINICAL DATA:  Acute coronary syndrome. EXAM: PORTABLE CHEST 1 VIEW COMPARISON:  Touched radiographs on 152024, 01/05/2023, 01/04/2023 FINDINGS: Right internal jugular dual-lumen central venous catheter with tip overlying the superior vena cava/right atrial junction is unchanged from prior. Cardiac silhouette is again moderately enlarged. Mediastinal contours are within normal limits.  There is mildly worsened interstitial thickening bilaterally. There is again cephalization of the pulmonary vasculature. No pleural effusion or pneumothorax.  No acute skeletal abnormality. IMPRESSION: Mildly worsened interstitial thickening bilaterally, likely worsened mild interstitial pulmonary edema. Electronically Signed   By: Neita Garnet M.D.   On: 05/08/2023 18:42   Medications:  clevidipine 10 mg/hr (05/10/23 0600)   heparin sodium (porcine)      carvedilol  25 mg Oral BID WC   Chlorhexidine Gluconate Cloth  6 each Topical Q0600   divalproex  500 mg Oral Daily   heparin  5,000 Units Subcutaneous Q8H   hydrALAZINE  100 mg Oral TID   losartan  100 mg Oral Daily   minoxidil  5 mg Oral BID   NIFEdipine  30 mg Oral Daily    Dialysis Orders: Center: [facility], EDW 79.1    4-hour dialysis no heparin, 2K 2 calcium bath No heparin, calcitriol 1.25 mcg p.o. q. dialysis Mircera 150 mcg every 2 weeks last given 12/17.    Assessment/Plan AHRF:  volume overload pulmonary edema secondary to missed HD questionable BP med compliance= HD on admit 6 L UF with another 4L < 24h later due to ongoing hypoxia, now below EDW.  F/u CXR 12/28 (prior to 2nd dialysis)  favored edema but called in to question possible R developing opacity.  T 99.1, WBC normal.  Low threshhold for abx.  HTN urgency: on admission, requiring IV antiHTN meds.  Massive UF (10L) in past 36h.  Off IV BP meds now but still HTN.  Outpt dialysis BPs pre and post 190-200s typically.  Suspect med nonadherence.  Aim for BP 150-160s here --> currently achieving this AM.  Cont current oral meds.  ESRD -HD TTS HD(needs better compliance as outpatient standing full treatment, has been ongoing issue with counseling). Will plan for another UF session tomorrow (2h, 3L with standing post wt) to ensure dry, then resume TTS schedule Tues.   History of pericardial effusion:  moderate effusion observed on 03/2023 TTE; no tamponade physiology present; TTE  pending Anemia  -Hgb 10.5, follow-up Hgb trend for ESA if needed next due 05/12/2023 Metabolic bone disease -corrected calcium elevated hold vitamin D for now follow-up trend phosphorus 6.5 continue binder History of Alport syndrome with bilateral hearing loss  History of bipolar disease = meds per admit on Depakote   Estill Bakes MD 05/10/2023, 9:02 AM  [facility] Pager: [phone]

## 2023-05-10 NOTE — Progress Notes (Signed)
Pt transferred to 6N. V/S/S

## 2023-05-10 NOTE — Progress Notes (Signed)
Clinically stable on PM rounds  HR 92 Pulse ox 93%-94%, on room air SBP 174 Off cleviprex since 11:15am   Transfer to renal floor PO meds FPTS to pick up tomorrow 05/11/23 and ccm offf    SIGNATURE    Dr. Kalman Shan, M.D., F.C.C.P,  Pulmonary and Critical Care Medicine Staff Physician, [facility] Center Director - Interstitial Lung Disease  Program  Pulmonary Fibrosis [facility] at [facility] [place], [place], [address]   Pager: [phone], If no answer  -> Check AMION or Try [phone] Telephone (clinical office): [phone] Telephone (research): [phone]  4:51 PM 05/10/2023     LABS    PULMONARY No results for input(s): "PHART", "PCO2ART", "PO2ART", "HCO3", "TCO2", "O2SAT" in the last 168 hours.  Invalid input(s): "PCO2", "PO2"  CBC Recent Labs  Lab 05/08/23 1755 05/09/23 0836  HGB 11.1* 10.5*  HCT 33.5* 31.6*  WBC 4.9 4.7  PLT 141* 148*    COAGULATION No results for input(s): "INR" in the last 168 hours.  CARDIAC  No results for input(s): "TROPONINI" in the last 168 hours. No results for input(s): "PROBNP" in the last 168 hours.   CHEMISTRY Recent Labs  Lab 05/08/23 1755 05/09/23 0836  NA 126* 130*  K 4.8 3.8  CL 85* 93*  CO2 16* 21*  GLUCOSE 95 114*  BUN 98* 50*  CREATININE 21.01* 13.82*  CALCIUM 9.2 9.2  MG 2.5* 1.9  PHOS 9.2* 6.5*   Estimated Creatinine Clearance: 7.2 mL/min (A) (by C-G formula based on SCr of 13.82 mg/dL (H)).   LIVER Recent Labs  Lab 05/08/23 1755 05/09/23 0836  AST 21  --   ALT 12  --   ALKPHOS 88  --   BILITOT 0.7  --   PROT 6.2*  --   ALBUMIN 3.1* 3.0*     INFECTIOUS No results for input(s): "LATICACIDVEN", "PROCALCITON" in the last 168 hours.   ENDOCRINE CBG (last 3)  Recent Labs    05/09/23 1102  GLUCAP 91         IMAGING x48h  - image(s) personally visualized  -   highlighted in bold ECHOCARDIOGRAM LIMITED Result Date:  05/10/2023    ECHOCARDIOGRAM LIMITED REPORT   Patient Name:   [name] Date of Exam: 05/10/2023 Medical Rec #:  [id number]       Height:       68.0 in Accession #:    [id number]      Weight:       166.9 lb Date of Birth:  [birth year]       BSA:          1.892 m Patient Age:    37 years        BP:           181/110 mmHg Patient Gender: M               HR:           94 bpm. Exam Location:  Inpatient Procedure: Limited Echo, Color Doppler and Cardiac Doppler Indications:    pericardial effusion  History:        Patient has prior history of Echocardiogram examinations, most                 recent 04/02/2023. End stage renal disease; Risk                 Factors:Hypertension and Current Smoker.  Sonographer:    Delcie Roch RDCS Referring Phys: [phone] SCOTT GOLD[place]ON IMPRESSIONS  1. Left ventricular ejection fraction, by estimation, is 55 to 60%. The left ventricle has normal function. The left ventricle has no regional wall motion abnormalities. There is severe concentric left ventricular hypertrophy.  2. Right ventricular systolic function is normal. The right ventricular size is normal. There is mildly elevated pulmonary artery systolic pressure.  3. Moderate pericardial effusion. The pericardial effusion is circumferential.  4. The mitral valve is normal in structure. Mild mitral valve regurgitation. No evidence of mitral stenosis.  5. The aortic valve is normal in structure. Aortic valve regurgitation is not visualized. No aortic stenosis is present.  6. Mildly dilated pulmonary artery.  7. The inferior vena cava is normal in size with greater than 50% respiratory variability, suggesting right atrial pressure of 3 mmHg. FINDINGS  Left Ventricle: Left ventricular ejection fraction, by estimation, is 55 to 60%. The left ventricle has normal function. The left ventricle has no regional wall motion abnormalities. The left ventricular internal cavity size was normal in size. There is  severe concentric left  ventricular hypertrophy. Right Ventricle: The right ventricular size is normal. No increase in right ventricular wall thickness. Right ventricular systolic function is normal. There is mildly elevated pulmonary artery systolic pressure. The tricuspid regurgitant velocity is 2.96  m/s, and with an assumed right atrial pressure of 3 mmHg, the estimated right ventricular systolic pressure is 38.0 mmHg. Left Atrium: Left atrial size was normal in size. Right Atrium: Right atrial size was normal in size. Pericardium: A moderately sized pericardial effusion is present. The pericardial effusion is circumferential. Mitral Valve: The mitral valve is normal in structure. Mild mitral valve regurgitation. No evidence of mitral valve stenosis. Tricuspid Valve: The tricuspid valve is normal in structure. Tricuspid valve regurgitation is not demonstrated. No evidence of tricuspid stenosis. Aortic Valve: The aortic valve is normal in structure. Aortic valve regurgitation is not visualized. No aortic stenosis is present. Pulmonic Valve: The pulmonic valve was normal in structure. Pulmonic valve regurgitation is not visualized. No evidence of pulmonic stenosis. Aorta: The aortic root is normal in size and structure. Pulmonary Artery: The pulmonary artery is mildly dilated. Venous: The inferior vena cava is normal in size with greater than 50% respiratory variability, suggesting right atrial pressure of 3 mmHg. IAS/Shunts: No atrial level shunt detected by color flow Doppler. Additional Comments: Spectral Doppler performed. Color Doppler performed.  LEFT VENTRICLE PLAX 2D LVIDd:         5.50 cm LVIDs:         4.20 cm LV PW:         1.50 cm LV IVS:        1.20 cm LVOT diam:     2.00 cm LV SV:         77 LV SV Index:   41 LVOT Area:     3.14 cm  IVC IVC diam: 2.10 cm LEFT ATRIUM         Index LA diam:    4.60 cm 2.43 cm/m  AORTIC VALVE LVOT Vmax:   142.00 cm/s LVOT Vmean:  93.000 cm/s LVOT VTI:    0.246 m MITRAL VALVE                 TRICUSPID VALVE MV Area (PHT): 5.62 cm     TR Peak grad:   35.0 mmHg MV Decel Time: 135 msec     TR Vmax:  296.00 cm/s MV E velocity: 128.00 cm/s MV A velocity: 81.20 cm/s   SHUNTS MV E/A ratio:  1.58         Systemic VTI:  0.25 m                             Systemic Diam: 2.00 cm Lavona Mound Tobb DO Electronically signed by Thomasene Ripple DO Signature Date/Time: 05/10/2023/4:27:19 PM    Final    DG Chest Port 1 View Result Date: 05/09/2023 CLINICAL DATA:  37 year old male with history of shortness of breath. EXAM: PORTABLE CHEST 1 VIEW COMPARISON:  Chest x-ray 05/08/2023. FINDINGS: Right internal jugular PermCath with tip terminating near the superior cavoatrial junction. Lung volumes are normal. There is cephalization of the pulmonary vasculature and slight indistinctness of the interstitial markings suggestive of mild pulmonary edema. Focal opacity in the right mid lung increased in prominence compared to the prior study. No pneumothorax. No pleural effusions. Heart size is moderately enlarged. Upper mediastinal contours are within normal limits. IMPRESSION: 1. The appearance of the chest is once again favored to reflect congestive heart failure and/or fluid overload, however, there is a more focal developing opacity in the right mid lung which could suggest infectious airspace consolidation. Clinical correlation for signs and symptoms of developing pneumonia is recommended. Electronically Signed   By: Trudie Reed M.D.   On: 05/09/2023 08:45   CT HEAD WO CONTRAST ( ) Result Date: 05/09/2023 CLINICAL DATA:  37 year old male with sudden severe headache, missed dialysis yesterday. Cough and shortness of breath. EXAM: CT HEAD WITHOUT CONTRAST TECHNIQUE: Contiguous axial images were obtained from the base of the skull through the vertex without intravenous contrast. RADIATION DOSE REDUCTION: This exam was performed according to the departmental dose-optimization program which includes automated exposure  control, adjustment of the mA and/or kV according to patient size and/or use of iterative reconstruction technique. COMPARISON:  Brain MRI 09/17/2022.  Head CT 03/27/2023. FINDINGS: Brain: Cerebral volume remains within normal limits. No midline shift, ventriculomegaly, mass effect, evidence of mass lesion, intracranial hemorrhage or evidence of cortically based acute infarction. Subtle white matter hypodensity is stable, otherwise gray-white differentiation is within normal limits. Vascular: No suspicious intracranial vascular hyperdensity. Skull: Intact, negative. Sinuses/Orbits: Recurrent right mastoid effusion, similar to the MRI in May. Right tympanic cavity is clear. Other Visualized paranasal sinuses and mastoids are stable and well aerated. Other: Adenoid hypertrophy, stable and appears in part related to pharyngeal retention cysts on the previous MRI. Mildly Disconjugate gaze. Visualized scalp soft tissues are within normal limits. IMPRESSION: 1. No acute intracranial abnormality. Negative non contrast CT appearance of the brain aside from subtle chronic white matter changes. 2. Recurrent mild right mastoid effusion, similar to an MRI in May and significance doubtful. Electronically Signed   By: Odessa Fleming M.D.   On: 05/09/2023 04:51   DG Chest Portable 1 View Result Date: 05/08/2023 CLINICAL DATA:  Acute coronary syndrome. EXAM: PORTABLE CHEST 1 VIEW COMPARISON:  Touched radiographs on 152024, 01/05/2023, 01/04/2023 FINDINGS: Right internal jugular dual-lumen central venous catheter with tip overlying the superior vena cava/right atrial junction is unchanged from prior. Cardiac silhouette is again moderately enlarged. Mediastinal contours are within normal limits. There is mildly worsened interstitial thickening bilaterally. There is again cephalization of the pulmonary vasculature. No pleural effusion or pneumothorax.  No acute skeletal abnormality. IMPRESSION: Mildly worsened interstitial thickening  bilaterally, likely worsened mild interstitial pulmonary edema. Electronically Signed   By: Windy Fast  Viola M.D.   On: 05/08/2023 18:42

## 2023-05-11 ENCOUNTER — Other Ambulatory Visit: Payer: Self-pay

## 2023-05-11 ENCOUNTER — Encounter (HOSPITAL_COMMUNITY): Payer: Self-pay | Admitting: Family Medicine

## 2023-05-11 DIAGNOSIS — I10 Essential (primary) hypertension: Secondary | ICD-10-CM

## 2023-05-11 LAB — CBC
HCT: 34.1 % — ABNORMAL LOW (ref 39.0–52.0)
Hemoglobin: 11.3 g/dL — ABNORMAL LOW (ref 13.0–17.0)
MCH: 28 pg (ref 26.0–34.0)
MCHC: 33.1 g/dL (ref 30.0–36.0)
MCV: 84.6 fL (ref 80.0–100.0)
Platelets: 170 10*3/uL (ref 150–400)
RBC: 4.03 MIL/uL — ABNORMAL LOW (ref 4.22–5.81)
RDW: 15.6 % — ABNORMAL HIGH (ref 11.5–15.5)
WBC: 3.7 10*3/uL — ABNORMAL LOW (ref 4.0–10.5)
nRBC: 0 % (ref 0.0–0.2)

## 2023-05-11 LAB — COMPREHENSIVE METABOLIC PANEL
ALT: 11 U/L (ref 0–44)
AST: 19 U/L (ref 15–41)
Albumin: 3.2 g/dL — ABNORMAL LOW (ref 3.5–5.0)
Alkaline Phosphatase: 113 U/L (ref 38–126)
Anion gap: 16 — ABNORMAL HIGH (ref 5–15)
BUN: 52 mg/dL — ABNORMAL HIGH (ref 6–20)
CO2: 23 mmol/L (ref 22–32)
Calcium: 9.2 mg/dL (ref 8.9–10.3)
Chloride: 91 mmol/L — ABNORMAL LOW (ref 98–111)
Creatinine, Ser: 13.46 mg/dL — ABNORMAL HIGH (ref 0.61–1.24)
GFR, Estimated: 4 mL/min — ABNORMAL LOW (ref >60)
Glucose, Bld: 87 mg/dL (ref 70–99)
Potassium: 4.5 mmol/L (ref 3.5–5.1)
Sodium: 130 mmol/L — ABNORMAL LOW (ref 135–145)
Total Bilirubin: 0.7 mg/dL (ref <1.2)
Total Protein: 6.5 g/dL (ref 6.5–8.1)

## 2023-05-11 LAB — LACTIC ACID, PLASMA: Lactic Acid, Venous: 0.5 mmol/L (ref 0.5–1.9)

## 2023-05-11 LAB — PHOSPHORUS: Phosphorus: 7.7 mg/dL — ABNORMAL HIGH (ref 2.5–4.6)

## 2023-05-11 LAB — MAGNESIUM: Magnesium: 2.2 mg/dL (ref 1.7–2.4)

## 2023-05-11 LAB — BRAIN NATRIURETIC PEPTIDE: B Natriuretic Peptide: 1341.8 pg/mL — ABNORMAL HIGH (ref 0.0–100.0)

## 2023-05-11 LAB — PROCALCITONIN: Procalcitonin: 1.01 ng/mL

## 2023-05-11 MED ORDER — HEPARIN SODIUM (PORCINE) 1000 UNIT/ML DIALYSIS
1000.0000 [IU] | INTRAMUSCULAR | Status: DC | PRN
  Administered 2023-05-11: 3200 [IU]

## 2023-05-11 MED ORDER — PENTAFLUOROPROP-TETRAFLUOROETH EX AERO: 1.0000 | INHALATION_SPRAY | CUTANEOUS | Status: DC | PRN

## 2023-05-11 MED ORDER — LIDOCAINE HCL (PF) 1 % IJ SOLN: 5.0000 mL | INTRAMUSCULAR | Status: DC | PRN

## 2023-05-11 MED ORDER — ALTEPLASE 2 MG IJ SOLR: 2.0000 mg | Freq: Once | INTRAMUSCULAR | Status: DC | PRN

## 2023-05-11 MED ORDER — ANTICOAGULANT SODIUM CITRATE 4% (200MG/5ML) IV SOLN: 5.0000 mL | Status: DC | PRN

## 2023-05-11 MED ORDER — HEPARIN SODIUM (PORCINE) 1000 UNIT/ML IJ SOLN
INTRAMUSCULAR | Status: AC
  Filled 2023-05-11: qty 4

## 2023-05-11 MED ORDER — DIPHENHYDRAMINE HCL 50 MG/ML IJ SOLN
25.0000 mg | Freq: Once | INTRAMUSCULAR | Status: AC
  Administered 2023-05-11: 25 mg via INTRAVENOUS

## 2023-05-11 MED ORDER — DIPHENHYDRAMINE HCL 50 MG/ML IJ SOLN
INTRAMUSCULAR | Status: AC
  Filled 2023-05-11: qty 1

## 2023-05-11 MED ORDER — LIDOCAINE-PRILOCAINE 2.5-2.5 % EX CREA: 1.0000 | TOPICAL_CREAM | CUTANEOUS | Status: DC | PRN

## 2023-05-11 MED ORDER — GABAPENTIN 100 MG PO CAPS
100.0000 mg | ORAL_CAPSULE | Freq: Once | ORAL | Status: AC
Start: 2023-05-11 — End: 2023-05-11
  Administered 2023-05-11: 100 mg via ORAL
  Filled 2023-05-11: qty 1

## 2023-05-11 NOTE — Assessment & Plan Note (Addendum)
Echo with persistent moderate pericardial effusion, consistent with prior TEE on 04/02/2023. -Consult Cardiology to discuss management further

## 2023-05-11 NOTE — TOC Initial Note (Signed)
Transition of Care [facility]) - Initial/Assessment Note    Patient Details  Name: [name] MRN: [id number] Date of Birth: 1985/06/28  [facility]) CM/SW Contact:    Kermit Balo, RN Phone Number: 05/11/2023, 3:42 PM  Clinical Narrative:                  Pt lives at home alone. He says he has support from friends and family.  Pt is ESRD and denies issues with HD but CM sees he has missed HD sessions.  He denies issues with home medications but see he also is noncompliant at home with meds.  He denies issues with transportation.  Home health arranged with [facility]. Information on the AVS.  TOC following.  Expected Discharge Plan: Home w Home Health Services Barriers to Discharge: Continued Medical Work up   Patient Goals and CMS Choice   CMS Medicare.gov Compare Post Acute Care list provided to:: Patient Choice offered to / list presented to : Patient      Expected Discharge Plan and Services   Discharge Planning Services: CM Consult Post Acute Care Choice: Home Health Living arrangements for the past 2 months: Apartment                           HH Arranged: PT HH Agency: [facility] Date [facility] Agency Contacted: 05/11/23   Representative spoke with at [facility] Agency: Tresa Endo  Prior Living Arrangements/Services Living arrangements for the past 2 months: Apartment Lives with:: Self Patient language and need for interpreter reviewed:: Yes Do you feel safe going back to the place where you live?: Yes          Current home services: DME (cane) Criminal Activity/Legal Involvement Pertinent to Current Situation/Hospitalization: No - Comment as needed  Activities of Daily Living      Permission Sought/Granted                  Emotional Assessment Appearance:: Appears stated age Attitude/Demeanor/Rapport: Engaged Affect (typically observed): Accepting Orientation: : Oriented to Self, Oriented to Place, Oriented to Situation   Psych  Involvement: No (comment)  Admission diagnosis:  Hypertensive emergency [I16.1] Dyspnea, unspecified type [R06.00] Patient Active Problem List   Diagnosis Date Noted   Diarrhea 05/08/2023   Bacteremia associated with intravascular line (HCC) 04/02/2023   Right shoulder pain 03/30/2023   Moderate pericardial effusion  Chest pain 03/28/2023   Blood in stool 03/28/2023   Sepsis (HCC) 03/27/2023   GI bleed 12/04/2022   ESRD on dialysis (HCC) 12/04/2022   Hematemesis 12/03/2022   Acute GI bleeding 12/03/2022   Cannabis use disorder, moderate, in controlled environment, dependence (HCC) 11/13/2022   Cannabis dependence with withdrawal (HCC) 11/13/2022   Anemia of chronic disease 11/12/2022   Hypertension 11/11/2022   Acute cough 11/11/2022   Esophageal dysphagia 11/11/2022   Abnormal barium swallow 11/11/2022   Chronic diastolic CHF (congestive heart failure) (HCC) 11/09/2022   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   Macrocytic anemia 11/08/2022   A-V fistula (HCC) 11/07/2022   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/07/2022   Anemia 09/23/2022   Headache 09/23/2022   Volume overload 09/23/2022   ESRD (end stage renal disease) on dialysis (HCC) 09/23/2022   Atypical chest pain 09/23/2022   Gastritis and gastroduodenitis 06/06/2022   Migraines 06/05/2022   Pancreatitis 06/03/2022   Epigastric abdominal pain 06/03/2022   Abdominal pain 06/03/2022   Rectal bleeding 04/14/2022  Hypertensive crisis 04/13/2022   Heart failure with mildly reduced ejection fraction (HFmrEF) (HCC) 04/08/2022   Bilateral shoulder pain 04/08/2022   Acute otitis externa of both ears 04/08/2022   Chest x-ray abnormality    Dyspnea 03/02/2022   Chest pain 03/02/2022   Hyperkalemia 03/02/2022   Leg pain, bilateral 01/03/2022   Bilateral calf pain    Housing insecurity 01/01/2022   Food insecurity 01/01/2022   Non-compliance with renal dialysis (HCC)    Hearing loss 10/08/2021    Acute lower GI bleeding 06/10/2021   Family history of diabetes mellitus 02/11/2021   Hypoglycemic syndrome 02/11/2021   Rash and nonspecific skin eruption 12/16/2019   Rash on scrotum 12/16/2019   Severe uncontrolled hypertension 06/07/2018   ESRD needing dialysis 06/07/2018   Alport syndrome 07/14/2017   GERD (gastroesophageal reflux disease) 07/14/2017   History of appendectomy 07/14/2017   Obesity (BMI 30.0-34.9) 07/14/2017   Pre-transplant evaluation for kidney transplant 07/14/2017   Secondary hyperparathyroidism (HCC) 07/14/2017   Tobacco use 07/14/2017   Bipolar 1 disorder (HCC) 09/01/2013   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 [place] [address] Phone: [phone] Fax: [phone]     Social Drivers of Health (SDOH) Social History: SDOH Screenings   Food Insecurity: No Food Insecurity (04/06/2023)  Housing: Patient Declined (04/06/2023)  Transportation Needs: No Transportation Needs (04/06/2023)  Utilities: Not At Risk (04/06/2023)  Depression (PHQ2-9): Medium Risk (03/24/2023)  Physical Activity: Inactive (03/24/2023)  Social Connections: Unknown (09/20/2021)   Received from [facility]  Stress: Stress Concern Present (03/24/2023)  Tobacco Use: High Risk (04/20/2023)   SDOH Interventions:     Readmission Risk Interventions     No data to display

## 2023-05-11 NOTE — Assessment & Plan Note (Signed)
Started prior to hospitalization and ongoing.  Last stool overnight, will continue to monitor.

## 2023-05-11 NOTE — Progress Notes (Signed)
Pt receives out-pt HD at [facility] on TTS. Will assist as needed.   [name] Renal Navigator [phone] 

## 2023-05-11 NOTE — Plan of Care (Signed)
FMTS Interim Progress Note  Spoke with cardiology on-call, Dr. Juline Patch, about moderate pericardial effusion persistent from TEE on 04/02/2023.  Unfortunately patient with poor follow-up outpatient therefore not a good candidate for colchicine.  She recommended continuing current therapy with outpatient cardiology follow-up for monitoring.  [name], MD 05/11/2023, 2:11 PM PGY-2, [facility] Family Medicine Service pager [phone]

## 2023-05-11 NOTE — Assessment & Plan Note (Addendum)
Received HD today, plan to resume TTS schedule.  Appears euvolemic upon exam.  Ongoing itching particularly during dialysis sessions, can limit HD sessions.  Unclear origin, will discuss with pharmacy potential HD related med. -Nephrology following, appreciate care -Renal diet w/ 1200 mL fluid restriction -Trend RFP -Discussed ongoing itching with pharmacy for possible medication effect

## 2023-05-11 NOTE — Assessment & Plan Note (Signed)
Ongoing prior to hospitalization and worse during dialysis.  Cramping and neuropathic in origin, will trial gabapentin for relief.  Pain does limit HD sessions due to discomfort per patient. -Gabapentin 100 mg after dialysis -D/c tramadol and tizanidine

## 2023-05-11 NOTE — Progress Notes (Signed)
OT Cancellation Note  Patient Details Name: [name] MRN: [id number] DOB: 1985/07/20   Cancelled Treatment:    Reason Eval/Treat Not Completed: Patient at procedure or test/ unavailable- HD will follow and see as able.   Barry Brunner, OT Acute Rehabilitation Services Office [phone]   Chancy Milroy 05/11/2023, 8:51 AM

## 2023-05-11 NOTE — Assessment & Plan Note (Signed)
CXR with possible consolidation, procalcitonin elevated at 1.01.  Clinically well-appearing with reassuring respiratory exam. -Trend procalcitonin -Monitor respiratory status, if worsens consider adding on CAP coverage

## 2023-05-11 NOTE — Procedures (Signed)
HD Note:  Some information was entered later than the data was gathered due to patient care needs. The stated time with the data is accurate.  Received patient in bed to unit. He was asleep and, when awakened, he stated that he did not know he was on the way until he got here.  Minutes later he began complaining of intense pain in his lower limbs.  He also began itching. Pain medication given, see MAR  Alert and oriented.   Informed consent signed and in chart.   Access used: Right upper chest HD catheter Access issues: Patient movement caused the arterial pressures to rise above limits.  BFR reduced to 300.  Dr. [name] at beside and orders given for the itching.  See MAR.  Patient fell asleep soon after prn given for itching. No further complaints  TX duration: 2 hours  Alert, without acute distress.  Total UF removed: 3000 ml  Hand-off given to patient's nurse.   Transported back to the room   Youa Deloney L. Dareen Piano, RN Kidney Dialysis Unit.

## 2023-05-11 NOTE — Procedures (Signed)
I was present at this dialysis session. I have reviewed the session itself and made appropriate changes.   Vital signs in last 24 hours:  Temp:  [98 F (36.7 C)-98.9 F (37.2 C)] 98.7 F (37.1 C) (12/30 0848) Pulse Rate:  [85-99] 92 (12/30 0900) Resp:  [8-28] 17 (12/30 0900) BP: (143-196)/(88-129) 182/112 (12/30 0900) SpO2:  [87 %-100 %] 100 % (12/30 0900) Weight:  [75.7 kg] 75.7 kg (12/30 0848) Weight change:  Filed Weights   05/09/23 1522 05/10/23 0400 05/11/23 0848  Weight: 82.7 kg 75.7 kg 75.7 kg    Recent Labs  Lab 05/11/23 0339  NA 130*  K 4.5  CL 91*  CO2 23  GLUCOSE 87  BUN 52*  CREATININE 13.46*  CALCIUM 9.2  PHOS 7.7*    Recent Labs  Lab 05/08/23 1755 05/09/23 0836 05/11/23 0339  WBC 4.9 4.7 3.7*  NEUTROABS 3.5  --   --   HGB 11.1* 10.5* 11.3*  HCT 33.5* 31.6* 34.1*  MCV 84.0 84.5 84.6  PLT 141* 148* 170    Scheduled Meds:  carvedilol  25 mg Oral BID WC   Chlorhexidine Gluconate Cloth  6 each Topical Q0600   divalproex  500 mg Oral Daily   heparin  5,000 Units Subcutaneous Q8H   hydrALAZINE  100 mg Oral TID   losartan  100 mg Oral Daily   minoxidil  10 mg Oral BID   NIFEdipine  60 mg Oral Daily   Continuous Infusions:  anticoagulant sodium citrate     heparin sodium (porcine)     PRN Meds:.acetaminophen **OR** acetaminophen, alteplase, anticoagulant sodium citrate, famotidine, heparin, heparin sodium (porcine), lidocaine (PF), lidocaine-prilocaine, pentafluoroprop-tetrafluoroeth, prochlorperazine, tiZANidine, traMADol   Edward Cords,  MD 05/11/2023, 9:02 AM

## 2023-05-11 NOTE — Plan of Care (Signed)
FMTS Interim Progress Note  Spoke with Dr. Jenene Slicker, on-call ophthalmology, about ongoing blurred and spotty vision in the setting of hypertensive emergency.  She recommended patient follow-up outpatient with her specifically as she is a retina specialist for possible injection and/or laser therapy.  No further recommendations while inpatient.  Patient can be seen by her in the [facility] office on Wednesday or Friday (walk-in available).  Will provide patient with follow-up and scheduling information upon discharge.   [name], MD 05/11/2023, 6:10 PM PGY-2, [facility] Family Medicine Service pager [phone]

## 2023-05-11 NOTE — Procedures (Signed)
Received patient in bed to unit.  Alert and oriented.  Informed consent signed and in chart.   TX duration: 2 hours  Patient tolerated well.  Transported back to the room  Alert, without acute distress.  Hand-off given to patient's nurse.   Access used: CVC Access issues: NONE  Total UF removed: 3 liters Post HD weight: 74.8 kg standing  Lu Duffel, RN Kidney Dialysis Unit

## 2023-05-11 NOTE — Assessment & Plan Note (Addendum)
Previously hypertensive emergency but now off Cleviprex drip since 12/29. BP persistently elevated but patient is fairly uncontrolled at baseline.  Ongoing blurred vision and decreased visual acuity since admission but persistent today, will discuss with ophthalmology about further evaluation recommendations.  Will defer to nephro for ongoing blood pressure management given ongoing HD. -Curbside ophthalmology, appreciate their recommendations -Appreciate nephro management of BP

## 2023-05-11 NOTE — Plan of Care (Signed)
  Problem: Health Behavior/Discharge Planning: Goal: Ability to manage health-related needs will improve Outcome: Progressing   Problem: Clinical Measurements: Goal: Ability to maintain clinical measurements within normal limits will improve Outcome: Progressing   Problem: Clinical Measurements: Goal: Will remain free from infection Outcome: Progressing   Problem: Nutrition: Goal: Adequate nutrition will be maintained Outcome: Progressing   Problem: Activity: Goal: Risk for activity intolerance will decrease Outcome: Progressing

## 2023-05-11 NOTE — Care Management Important Message (Signed)
Important Message  Patient Details  Name: [name] MRN: [id number] Date of Birth: 1985/10/17   Important Message Given:  Yes - Medicare IM     Dorena Bodo 05/11/2023, 3:55 PM

## 2023-05-11 NOTE — Progress Notes (Signed)
Daily Progress Note Intern Pager: [phone]  Patient name: [name] Medical record number: [id number] Date of birth: 02/07/1986 Age: 37 y.o. Gender: male  Primary Care Provider: [name], DO Consultants: Nephrology Code Status: Full code  Pt Overview and Major Events to Date:  12/27: Admitted to ICU for hypertensive emergency, received emergent HD 12/29: Weaned off Cleviprex drip 12/30: Transferred to FMTS  Assessment and Plan: 37 yo with PMHx of Alport syndrome, ESRD on HD, hearing impairment and difficult to control HTN admitted with hypertensive emergency to the ICU now weaned off Cleviprex drip and transferred to FMTS service Assessment & Plan Severe uncontrolled hypertension Previously hypertensive emergency but now off Cleviprex drip since 12/29. BP persistently elevated but patient is fairly uncontrolled at baseline.  Ongoing blurred vision and decreased visual acuity since admission but persistent today, will discuss with ophthalmology about further evaluation recommendations.  Will defer to nephro for ongoing blood pressure management given ongoing HD. -Curbside ophthalmology, appreciate their recommendations -Appreciate nephro management of BP ESRD needing dialysis Received HD today, plan to resume TTS schedule.  Appears euvolemic upon exam.  Ongoing itching particularly during dialysis sessions, can limit HD sessions.  Unclear origin, will discuss with pharmacy potential HD related med. -Nephrology following, appreciate care -Renal diet w/ 1200 mL fluid restriction -Trend RFP -Discussed ongoing itching with pharmacy for possible medication effect Moderate pericardial effusion  Chest pain Echo with persistent moderate pericardial effusion, consistent with prior TEE on 04/02/2023. -Consult Cardiology to discuss management further Diarrhea Started prior to hospitalization and ongoing.  Last stool overnight, will continue to monitor. Chest x-ray abnormality CXR  with possible consolidation, procalcitonin elevated at 1.01.  Clinically well-appearing with reassuring respiratory exam. -Trend procalcitonin -Monitor respiratory status, if worsens consider adding on CAP coverage Leg pain, bilateral Ongoing prior to hospitalization and worse during dialysis.  Cramping and neuropathic in origin, will trial gabapentin for relief.  Pain does limit HD sessions due to discomfort per patient. -Gabapentin 100 mg after dialysis -D/c tramadol and tizanidine   Chronic and Stable Problems: Anemia - Stable, likely anemia of CKD Bipolar 1 disorder - Continue Depakote 500 mg daily Migraines - Continue Depakote 500 mg daily   FEN/GI: Renal diet PPx: Heparin Dispo: Home pending clinical improvement  Subjective:  Assessed at bedside in HD, complains of ongoing leg cramps and spasms.  Relates pain is sharp and shooting, not relieved by tramadol.  Continues to have persistent itching only during HD as well.  Objective: Temp:  [98 F (36.7 C)-98.9 F (37.2 C)] 98.7 F (37.1 C) (12/30 0848) Pulse Rate:  [85-99] 92 (12/30 0848) Resp:  [8-28] 18 (12/30 0848) BP: (143-196)/(88-129) 187/119 (12/30 0848) SpO2:  [87 %-100 %] 100 % (12/30 0848) Weight:  [75.7 kg] 75.7 kg (12/30 0848) Physical Exam: General: Fatigued appearing, NAD. Cardiovascular: RRR without murmur Respiratory: CTAB.  Normal work of breathing on room air Abdomen: Soft, nontender, nondistended Extremities: No peripheral edema.  Pain with light touch on bilateral lower extremities from the knee down.  Normal strength and pulses.  Laboratory: Most recent CBC Lab Results  Component Value Date   WBC 3.7 (L) 05/11/2023   HGB 11.3 (L) 05/11/2023   HCT 34.1 (L) 05/11/2023   MCV 84.6 05/11/2023   PLT 170 05/11/2023   Most recent BMP    Latest Ref Rng & Units 05/11/2023    3:39 AM  BMP  Glucose 70 - 99 mg/dL 87   BUN 6 - 20 mg/dL 52   Creatinine  0.61 - 1.24 mg/dL 16.10   Sodium 960 - 454 mmol/L  130   Potassium 3.5 - 5.1 mmol/L 4.5   Chloride 98 - 111 mmol/L 91   CO2 22 - 32 mmol/L 23   Calcium 8.9 - 10.3 mg/dL 9.2     Other pertinent labs: BNP: 1341 Lactic acid: 0.5 Procalcitonin: 1.01 RVP: Negative  Imaging/Diagnostic Tests: ECHOCARDIOGRAM LIMITED Result Date: 05/10/2023 IMPRESSIONS  1. Left ventricular ejection fraction, by estimation, is 55 to 60%. The left ventricle has normal function. The left ventricle has no regional wall motion abnormalities. There is severe concentric left ventricular hypertrophy.  2. Right ventricular systolic function is normal. The right ventricular size is normal. There is mildly elevated pulmonary artery systolic pressure.  3. Moderate pericardial effusion. The pericardial effusion is circumferential.  4. The mitral valve is normal in structure. Mild mitral valve regurgitation. No evidence of mitral stenosis.  5. The aortic valve is normal in structure. Aortic valve regurgitation is not visualized. No aortic stenosis is present.  6. Mildly dilated pulmonary artery.  7. The inferior vena cava is normal in size with greater than 50% respiratory variability, suggesting right atrial pressure of 3 mmHg.    Edward Fortis, MD 05/11/2023, 8:50 AM  PGY-2, [facility] Family Medicine FPTS Intern pager: [phone], text pages welcome Secure chat group [facility] Teaching Service

## 2023-05-12 DIAGNOSIS — I10 Essential (primary) hypertension: Secondary | ICD-10-CM | POA: Diagnosis not present

## 2023-05-12 LAB — RENAL FUNCTION PANEL
Albumin: 3.3 g/dL — ABNORMAL LOW (ref 3.5–5.0)
Anion gap: 19 — ABNORMAL HIGH (ref 5–15)
BUN: 81 mg/dL — ABNORMAL HIGH (ref 6–20)
CO2: 21 mmol/L — ABNORMAL LOW (ref 22–32)
Calcium: 9.4 mg/dL (ref 8.9–10.3)
Chloride: 88 mmol/L — ABNORMAL LOW (ref 98–111)
Creatinine, Ser: 15.53 mg/dL — ABNORMAL HIGH (ref 0.61–1.24)
GFR, Estimated: 4 mL/min — ABNORMAL LOW (ref >60)
Glucose, Bld: 79 mg/dL (ref 70–99)
Phosphorus: 9 mg/dL — ABNORMAL HIGH (ref 2.5–4.6)
Potassium: 5.5 mmol/L — ABNORMAL HIGH (ref 3.5–5.1)
Sodium: 128 mmol/L — ABNORMAL LOW (ref 135–145)

## 2023-05-12 LAB — CBC
HCT: 34.2 % — ABNORMAL LOW (ref 39.0–52.0)
Hemoglobin: 11.3 g/dL — ABNORMAL LOW (ref 13.0–17.0)
MCH: 27.8 pg (ref 26.0–34.0)
MCHC: 33 g/dL (ref 30.0–36.0)
MCV: 84.2 fL (ref 80.0–100.0)
Platelets: 226 10*3/uL (ref 150–400)
RBC: 4.06 MIL/uL — ABNORMAL LOW (ref 4.22–5.81)
RDW: 15.5 % (ref 11.5–15.5)
WBC: 4.5 10*3/uL (ref 4.0–10.5)
nRBC: 0 % (ref 0.0–0.2)

## 2023-05-12 LAB — HEPATITIS B SURFACE ANTIBODY, QUANTITATIVE: Hep B S AB Quant (Post): 258 m[IU]/mL

## 2023-05-12 LAB — PROCALCITONIN: Procalcitonin: 0.8 ng/mL

## 2023-05-12 MED ORDER — TRAMADOL HCL 50 MG PO TABS
50.0000 mg | ORAL_TABLET | Freq: Once | ORAL | Status: AC
  Administered 2023-05-12: 50 mg via ORAL
  Filled 2023-05-12: qty 1

## 2023-05-12 MED ORDER — CHLORHEXIDINE GLUCONATE CLOTH 2 % EX PADS: 6.0000 | MEDICATED_PAD | Freq: Every day | CUTANEOUS | Status: DC

## 2023-05-12 MED ORDER — SODIUM ZIRCONIUM CYCLOSILICATE 10 G PO PACK
10.0000 g | PACK | Freq: Once | ORAL | Status: AC
  Administered 2023-05-12: 10 g via ORAL
  Filled 2023-05-12: qty 1

## 2023-05-12 MED ORDER — SEVELAMER CARBONATE 800 MG PO TABS
1600.0000 mg | ORAL_TABLET | Freq: Three times a day (TID) | ORAL | Status: DC
  Administered 2023-05-12 – 2023-05-14 (×7): 1600 mg via ORAL
  Filled 2023-05-12 (×7): qty 2

## 2023-05-12 MED ORDER — DIPHENHYDRAMINE HCL 25 MG PO CAPS
25.0000 mg | ORAL_CAPSULE | Freq: Once | ORAL | Status: AC
Start: 2023-05-12 — End: 2023-05-12
  Administered 2023-05-12: 25 mg via ORAL
  Filled 2023-05-12 (×2): qty 1

## 2023-05-12 NOTE — Progress Notes (Signed)
 Mobility Specialist: Progress Note   05/12/23 1231  Mobility  Activity Ambulated independently in hallway  Level of Assistance Independent  Assistive Device None  Distance Ambulated (ft) 600 ft  Activity Response Tolerated well  Mobility Referral Yes  Mobility visit 1 Mobility  Mobility Specialist Start Time (ACUTE ONLY) 0846  Mobility Specialist Stop Time (ACUTE ONLY) 0854  Mobility Specialist Time Calculation (min) (ACUTE ONLY) 8 min    Received pt on EOB having no complaints and agreeable to mobility. Pt was asymptomatic throughout ambulation and returned to room w/o fault. Left on EOB w/ call bell in reach and all needs met.  Ileana Lute Mobility Specialist Please contact via SecureChat or Rehab office at [phone]

## 2023-05-12 NOTE — Assessment & Plan Note (Signed)
 Echo with persistent moderate pericardial effusion, consistent with prior TEE on 04/02/2023. Cardiology consulted and felt poor candidate for Tx, pt to f/u outpt. No tamponade physiology at this time. -CTM

## 2023-05-12 NOTE — Progress Notes (Signed)
 [facility] Progress Note   Subjective:   Patient seen in room, asks about going home today. Reports he is feeling well. Denies SOB, CP, dizziness,  HA. BP remains elevated overnight.   Objective Vitals:   05/11/23 1441 05/11/23 1609 05/11/23 2233 05/12/23 0446  BP: (!) 193/111 (!) 168/108 (!) 179/104 (!) 189/117  Pulse: 87 86 84 90  Resp: 18 16 20 20   Temp: 98.7 F (37.1 C) 98.5 F (36.9 C) 98 F (36.7 C) 97.7 F (36.5 C)  TempSrc: Oral Oral Oral Oral  SpO2: 97% 96% 96% 100%  Weight:      Height:       Physical Exam General: Alert male in NAD Heart: RRR, no murmurs, rubs or gallops Lungs: CTA bilaterally, respirations unlabored on RA Abdomen: Soft, non-distended, +BS Extremities: No edema b/l lower extremities Dialysis Access: R internal jugular [facility]  Additional Objective Labs: Basic Metabolic Panel: Recent Labs  Lab 05/09/23 0836 05/11/23 0339 05/12/23 0421  NA 130* 130* 128*  K 3.8 4.5 5.5*  CL 93* 91* 88*  CO2 21* 23 21*  GLUCOSE 114* 87 79  BUN 50* 52* 81*  CREATININE 13.82* 13.46* 15.53*  CALCIUM  9.2 9.2 9.4  PHOS 6.5* 7.7* 9.0*   Liver Function Tests: Recent Labs  Lab 05/08/23 1755 05/09/23 0836 05/11/23 0339 05/12/23 0421  AST 21  --  19  --   ALT 12  --  11  --   ALKPHOS 88  --  113  --   BILITOT 0.7  --  0.7  --   PROT 6.2*  --  6.5  --   ALBUMIN  3.1* 3.0* 3.2* 3.3*   No results for input(s): LIPASE, AMYLASE in the last 168 hours. CBC: Recent Labs  Lab 05/08/23 1755 05/09/23 0836 05/11/23 0339 05/12/23 0421  WBC 4.9 4.7 3.7* 4.5  NEUTROABS 3.5  --   --   --   HGB 11.1* 10.5* 11.3* 11.3*  HCT 33.5* 31.6* 34.1* 34.2*  MCV 84.0 84.5 84.6 84.2  PLT 141* 148* 170 226   Blood Culture    Component Value Date/Time   SDES BLOOD SITE NOT SPECIFIED 04/02/2023 1339   SDES BLOOD SITE NOT SPECIFIED 04/02/2023 1339   SPECREQUEST  04/02/2023 1339    BOTTLES DRAWN AEROBIC AND ANAEROBIC Blood Culture results may not be  optimal due to an inadequate volume of blood received in culture bottles   SPECREQUEST  04/02/2023 1339    BOTTLES DRAWN AEROBIC AND ANAEROBIC Blood Culture results may not be optimal due to an inadequate volume of blood received in culture bottles   CULT  04/02/2023 1339    NO GROWTH 5 DAYS Performed at [facility], [address], [place], [place] [address]    CULT  04/02/2023 1339    NO GROWTH 5 DAYS Performed at [facility], [address] [place], [place] [address]    REPTSTATUS 04/07/2023 FINAL 04/02/2023 1339   REPTSTATUS 04/07/2023 FINAL 04/02/2023 1339    Cardiac Enzymes: No results for input(s): CKTOTAL, CKMB, CKMBINDEX, TROPONINI in the last 168 hours. CBG: Recent Labs  Lab 05/09/23 1102  GLUCAP 91   Iron  Studies: No results for input(s): IRON , TIBC, TRANSFERRIN, FERRITIN in the last 72 hours. @lablastinr3 @ Studies/Results: ECHOCARDIOGRAM LIMITED Result Date: 05/10/2023    ECHOCARDIOGRAM LIMITED REPORT   Patient Name:   [name] Date of Exam: 05/10/2023 Medical Rec #:  [id number]       Height:  68.0 in Accession #:    [id number]      Weight:       166.9 lb Date of Birth:  1986/03/03       BSA:          1.892 m Patient Age:    37 years        BP:           181/110 mmHg Patient Gender: M               HR:           94 bpm. Exam Location:  Inpatient Procedure: Limited Echo, Color Doppler and Cardiac Doppler Indications:    pericardial effusion  History:        Patient has prior history of Echocardiogram examinations, most                 recent 04/02/2023. End stage renal disease; Risk                 Factors:Hypertension and Current Smoker.  Sonographer:    Tinnie Barefoot RDCS Referring Phys: [phone] SCOTT [place] IMPRESSIONS  1. Left ventricular ejection fraction, by estimation, is 55 to 60%. The left ventricle has normal function. The left ventricle has no regional wall motion abnormalities. There is severe concentric left ventricular  hypertrophy.  2. Right ventricular systolic function is normal. The right ventricular size is normal. There is mildly elevated pulmonary artery systolic pressure.  3. Moderate pericardial effusion. The pericardial effusion is circumferential.  4. The mitral valve is normal in structure. Mild mitral valve regurgitation. No evidence of mitral stenosis.  5. The aortic valve is normal in structure. Aortic valve regurgitation is not visualized. No aortic stenosis is present.  6. Mildly dilated pulmonary artery.  7. The inferior vena cava is normal in size with greater than 50% respiratory variability, suggesting right atrial pressure of 3 mmHg. FINDINGS  Left Ventricle: Left ventricular ejection fraction, by estimation, is 55 to 60%. The left ventricle has normal function. The left ventricle has no regional wall motion abnormalities. The left ventricular internal cavity size was normal in size. There is  severe concentric left ventricular hypertrophy. Right Ventricle: The right ventricular size is normal. No increase in right ventricular wall thickness. Right ventricular systolic function is normal. There is mildly elevated pulmonary artery systolic pressure. The tricuspid regurgitant velocity is 2.96  m/s, and with an assumed right atrial pressure of 3 mmHg, the estimated right ventricular systolic pressure is 38.0 mmHg. Left Atrium: Left atrial size was normal in size. Right Atrium: Right atrial size was normal in size. Pericardium: A moderately sized pericardial effusion is present. The pericardial effusion is circumferential. Mitral Valve: The mitral valve is normal in structure. Mild mitral valve regurgitation. No evidence of mitral valve stenosis. Tricuspid Valve: The tricuspid valve is normal in structure. Tricuspid valve regurgitation is not demonstrated. No evidence of tricuspid stenosis. Aortic Valve: The aortic valve is normal in structure. Aortic valve regurgitation is not visualized. No aortic stenosis is  present. Pulmonic Valve: The pulmonic valve was normal in structure. Pulmonic valve regurgitation is not visualized. No evidence of pulmonic stenosis. Aorta: The aortic root is normal in size and structure. Pulmonary Artery: The pulmonary artery is mildly dilated. Venous: The inferior vena cava is normal in size with greater than 50% respiratory variability, suggesting right atrial pressure of 3 mmHg. IAS/Shunts: No atrial level shunt detected by color flow Doppler. Additional Comments: Spectral Doppler performed. Color Doppler performed.  LEFT VENTRICLE PLAX 2D LVIDd:         5.50 cm LVIDs:         4.20 cm LV PW:         1.50 cm LV IVS:        1.20 cm LVOT diam:     2.00 cm LV SV:         77 LV SV Index:   41 LVOT Area:     3.14 cm  IVC IVC diam: 2.10 cm LEFT ATRIUM         Index LA diam:    4.60 cm 2.43 cm/m  AORTIC VALVE LVOT Vmax:   142.00 cm/s LVOT Vmean:  93.000 cm/s LVOT VTI:    0.246 m MITRAL VALVE                TRICUSPID VALVE MV Area (PHT): 5.62 cm     TR Peak grad:   35.0 mmHg MV Decel Time: 135 msec     TR Vmax:        296.00 cm/s MV E velocity: 128.00 cm/s MV A velocity: 81.20 cm/s   SHUNTS MV E/A ratio:  1.58         Systemic VTI:  0.25 m                             Systemic Diam: 2.00 cm Kardie Tobb DO Electronically signed by Kardie Tobb DO Signature Date/Time: 05/10/2023/4:27:19 PM    Final    Medications:  heparin  sodium (porcine)      carvedilol   25 mg Oral BID WC   Chlorhexidine  Gluconate Cloth  6 each Topical Q0600   divalproex   500 mg Oral Daily   heparin   5,000 Units Subcutaneous Q8H   hydrALAZINE   100 mg Oral TID   minoxidil   10 mg Oral BID   NIFEdipine   60 mg Oral Daily    Outpatient Dialysis Orders:  [facility], EDW 79.1    4-hour dialysis no heparin , 2K 2 calcium  bath No heparin , calcitriol  1.25 mcg p.o. q. dialysis Mircera 150 mcg every 2 weeks last given 12/17.   Assessment/Plan: AHRF:  volume overload pulmonary edema secondary to missed HD questionable BP med  compliance. Wt is down ~8kg since admission. On RA today and denies SOB HTN urgency: on admission, requiring IV antiHTN meds.  S/p significant UF since admission. Off IV BP meds now but still HTN.  Outpt dialysis BPs pre and post 190-200s typically.  Suspect med nonadherence. Currently on carvedilol , hydralazine , minoxidil , nifedipine  (dose increased 12/30). BP high overnight but appears he missed evening dose of coreg . BP improved after meds this AM, if trends back up would  ESRD -HD TTS HD(needs better compliance as outpatient standing full treatment, has been ongoing issue with counseling). Had HD off schedule on 12/30 for UF. Will plan for HD again today as schedule allows. Needs lower EDW at discharge.  History of pericardial effusion:  moderate effusion observed on 03/2023 TTE, per cardiology/primary team Anemia  -Hgb 11.3, no ESA indicated at this time.  Metabolic bone disease -corrected calcium  elevated hold vitamin D  for now. Phos back up to 9.0, no binders on MAR at present. Restarting renvela .  History of Alport syndrome with bilateral hearing loss  History of bipolar disease = meds per admit on Depakote    Lucie Collet, PA-C 05/12/2023, 9:24 AM  [facility] Pager: [phone]

## 2023-05-12 NOTE — Assessment & Plan Note (Signed)
 CXR with possible consolidation, procalcitonin trending down at 0.80 (1.01). Low concern for CAP. Clinically well-appearing with reassuring respiratory exam. -Trend procalcitonin -Monitor respiratory status, if worsens consider adding on CAP coverage

## 2023-05-12 NOTE — Assessment & Plan Note (Signed)
 Ongoing prior to hospitalization and worse during dialysis.  Cramping and neuropathic in origin, gabapentin for relief.  Pain does limit HD sessions due to discomfort per patient. -Gabapentin 100 mg after dialysis

## 2023-05-12 NOTE — Progress Notes (Addendum)
 Daily Progress Note Intern Pager: [phone]  Patient name: [name] Medical record number: [id number] Date of birth: 07-30-1985 Age: 37 y.o. Gender: male  Primary Care Provider: Joshua Domino, DO Consultants: Nephrology Code Status: Full Code  Pt Overview and Major Events to Date:  12/27: Admitted to ICU for hypertensive emergency, received emergent HD 12/29: Weaned off Cleviprex  drip 12/30: Transferred to FMTS  Assessment and Plan:  37 yo with PMHx of Alport syndrome, ESRD on HD, hearing impairment and difficult to control HTN admitted with hypertensive emergency to the ICU now weaned off Cleviprex  drip and transferred to FMTS service Assessment & Plan Severe uncontrolled hypertension Previously hypertensive emergency but now off Cleviprex  drip since 12/29. BP persistently elevated but patient is fairly uncontrolled at baseline.  Ongoing blurred vision and decreased visual acuity, plan to f/u outpt per ophtho consult. BP elevated w/ SBP ~180's ,will defer to nephro for ongoing blood pressure management given ongoing HD. D/c losartan  for hyperkalemia today. -Appreciate nephro management of BP -Coreg  25 BID -Hydralazine  100 mg TID -Nifedipine  60 mg daily -Minoxidil  10 mg BID  ESRD needing dialysis Received HD yesterday, plan to resume TTS schedule.  Appears euvolemic upon exam. Plan to receive HD today. K 5.5 today, will treat as may not receive HD until late in day. -Nephrology following, appreciate care -Renal diet w/ 1200 mL fluid restriction -Trend RFP - 10 g Lokelma  x 1 -Discussed ongoing itching with pharmacy for possible medication effect Moderate pericardial effusion  Chest pain Echo with persistent moderate pericardial effusion, consistent with prior TEE on 04/02/2023. Cardiology consulted and felt poor candidate for Tx, pt to f/u outpt. No tamponade physiology at this time. -CTM  Diarrhea Started prior to hospitalization and ongoing. No stool documented  overnight, will continue to monitor. Chest x-ray abnormality CXR with possible consolidation, procalcitonin trending down at 0.80 (1.01). Low concern for CAP. Clinically well-appearing with reassuring respiratory exam. -Trend procalcitonin -Monitor respiratory status, if worsens consider adding on CAP coverage Leg pain, bilateral Ongoing prior to hospitalization and worse during dialysis.  Cramping and neuropathic in origin, gabapentin  for relief.  Pain does limit HD sessions due to discomfort per patient. -Gabapentin  100 mg after dialysis    Chronic and Stable Issues: Anemia - Stable, likely anemia of CKD Bipolar 1 disorder - Continue Depakote  500 mg daily Migraines - Continue Depakote  500 mg daily  FEN/GI: Renal Diet PPx: Heparin  Dispo:Home pending clinical improvement . Barriers include BP control.   Subjective:  Report's he's doing well today and would like to go home. Report's he has bills to handle at home.   Objective: Temp:  [97.7 F (36.5 C)-98.7 F (37.1 C)] 97.7 F (36.5 C) (12/31 0446) Pulse Rate:  [79-92] 90 (12/31 0446) Resp:  [15-20] 20 (12/31 0446) BP: (168-201)/(86-133) 189/117 (12/31 0446) SpO2:  [96 %-100 %] 100 % (12/31 0446) Weight:  [74.8 kg-75.7 kg] 74.8 kg (12/30 1117) Physical Exam: General: NAD, sleeping Cardiovascular: RRR, NRMG, S1/S2 Respiratory: CTABL, no wheezing, normal WOB Abdomen: Soft, NTTP, non-distended Extremities: Moving all extremities, no edema/swelling  Laboratory: Most recent CBC Lab Results  Component Value Date   WBC 3.7 (L) 05/11/2023   HGB 11.3 (L) 05/11/2023   HCT 34.1 (L) 05/11/2023   MCV 84.6 05/11/2023   PLT 170 05/11/2023   Most recent BMP    Latest Ref Rng & Units 05/12/2023    4:21 AM  BMP  Glucose 70 - 99 mg/dL 79   BUN 6 - 20  mg/dL 81   Creatinine 9.38 - 1.24 mg/dL 84.46   Sodium 864 - 854 mmol/L 128   Potassium 3.5 - 5.1 mmol/L 5.5   Chloride 98 - 111 mmol/L 88   CO2 22 - 32 mmol/L 21   Calcium  8.9  - 10.3 mg/dL 9.4     Other pertinent labs: Procalcitonin  0.80 (1.01)   Jennelle Riis, MD 05/12/2023, 7:40 AM  PGY-3, [facility] FPTS Intern pager: [phone], text pages welcome Secure chat group [facility] Teaching Service

## 2023-05-12 NOTE — Progress Notes (Signed)
 OT Cancellation Note  Patient Details Name: [name] MRN: [id number] DOB: Nov 29, 1985   Cancelled Treatment:    Reason Eval/Treat Not Completed: (P) OT screened, no needs identified, will sign off, Per PT, RN, and NT, Pt is independent with ADLs and mobility. Spoke with Pt at bedside, communicating with text due to severe HOH. Spoke with Pt about purpose and benefit of OT or follow up. Pt stated he was independent and had no therapy needs, no DME needs, and no follow up needs. Signing off  Elouise JONELLE Bott 05/12/2023, 5:28 PM

## 2023-05-12 NOTE — Assessment & Plan Note (Addendum)
 Received HD yesterday, plan to resume TTS schedule.  Appears euvolemic upon exam. Plan to receive HD today. K 5.5 today, will treat as may not receive HD until late in day. -Nephrology following, appreciate care -Renal diet w/ 1200 mL fluid restriction -Trend RFP - 10 g Lokelma  x 1 -Discussed ongoing itching with pharmacy for possible medication effect

## 2023-05-12 NOTE — Assessment & Plan Note (Addendum)
 Previously hypertensive emergency but now off Cleviprex  drip since 12/29. BP persistently elevated but patient is fairly uncontrolled at baseline.  Ongoing blurred vision and decreased visual acuity, plan to f/u outpt per ophtho consult. BP elevated w/ SBP ~180's ,will defer to nephro for ongoing blood pressure management given ongoing HD. D/c losartan  for hyperkalemia today. -Appreciate nephro management of BP -Coreg  25 BID -Hydralazine  100 mg TID -Nifedipine  60 mg daily -Minoxidil  10 mg BID

## 2023-05-12 NOTE — Assessment & Plan Note (Addendum)
 Started prior to hospitalization and ongoing. No stool documented overnight, will continue to monitor.

## 2023-05-13 ENCOUNTER — Inpatient Hospital Stay (HOSPITAL_COMMUNITY): Payer: Medicare Other

## 2023-05-13 DIAGNOSIS — I10 Essential (primary) hypertension: Secondary | ICD-10-CM | POA: Diagnosis not present

## 2023-05-13 LAB — RENAL FUNCTION PANEL
Albumin: 3.2 g/dL — ABNORMAL LOW (ref 3.5–5.0)
Anion gap: 16 — ABNORMAL HIGH (ref 5–15)
BUN: 56 mg/dL — ABNORMAL HIGH (ref 6–20)
CO2: 23 mmol/L (ref 22–32)
Calcium: 9.4 mg/dL (ref 8.9–10.3)
Chloride: 91 mmol/L — ABNORMAL LOW (ref 98–111)
Creatinine, Ser: 11.61 mg/dL — ABNORMAL HIGH (ref 0.61–1.24)
GFR, Estimated: 5 mL/min — ABNORMAL LOW (ref >60)
Glucose, Bld: 94 mg/dL (ref 70–99)
Phosphorus: 6.9 mg/dL — ABNORMAL HIGH (ref 2.5–4.6)
Potassium: 4.2 mmol/L (ref 3.5–5.1)
Sodium: 130 mmol/L — ABNORMAL LOW (ref 135–145)

## 2023-05-13 MED ORDER — CHLORHEXIDINE GLUCONATE CLOTH 2 % EX PADS: 6.0000 | MEDICATED_PAD | Freq: Every day | CUTANEOUS | Status: DC

## 2023-05-13 MED ORDER — GABAPENTIN 100 MG PO CAPS
100.0000 mg | ORAL_CAPSULE | Freq: Once | ORAL | Status: AC
  Administered 2023-05-13: 100 mg via ORAL
  Filled 2023-05-13 (×2): qty 1

## 2023-05-13 NOTE — Progress Notes (Addendum)
 This nurse entered room approximately 1445 to give patient gabapentin  as patient had requested. When this nurse entered room, patient began to yell and asked this nurse why hasn't anybody went and got my food? This nurse attempted to explain to patient that staff was unable to leave the floor to look for patient's food. This nurse attempted to educate patient on his current renal diet with fluid restriction and patient continued to yell stating you don't want nobody messing with your food. I paid good money for that food. Patient begins to look for his socks and shoes stating he will go get the food himself. This nurse attempted to explain to patient that he is unable to leave the floor due to safety reasons and patient states fuck this, I'm going to get my food. Security called by Ashley, RN/charge nurse. Patient left the unit ambulatory with PIV and HD cath in place approximately 1450. Security was able to escort ambulatory patient back to unit approximately 1505. MD made aware of situation.

## 2023-05-13 NOTE — Progress Notes (Signed)
 Daily Progress Note Intern Pager: [phone]  Patient name: [name] Medical record number: [id number] Date of birth: 11-12-1985 Age: 38 y.o. Gender: male  Primary Care Provider: Joshua Domino, DO Consultants: Nephrology Code Status: Full Code   Pt Overview and Major Events to Date:  12/27: Admitted to ICU for hypertensive emergency, received emergent HD 12/29: Weaned off Cleviprex  drip 12/30: Transferred to FMTS  Assessment and Plan: 38 yo with PMHx of Alport syndrome, ESRD on HD, hearing impairment and difficult to control HTN admitted with hypertensive emergency to the ICU now weaned off Cleviprex  drip and transferred to [facility] service for continued blood pressure management. Assessment & Plan Severe uncontrolled hypertension BP under much better control likely secondary to HD and medication adjustment.  Continued headache and vision changes, will obtain MRI brain to rule out intracranial pathology. -Appreciate nephro management of BP -Follow-up MRI brain -BP regimen: Coreg  25 BID, Hydralazine  100 mg TID, Nifedipine  60 mg daily and Minoxidil  10 mg BID ESRD needing dialysis Appears euvolemic, resume TTS schedule.  Hyperkalemia resolved, will continue to monitor with daily RFP. -Nephrology following, appreciate care -Renal diet w/ 1200 mL fluid restriction -Trend RFP -Hold home losartan  in the setting of hyperkalemia per Nephro Moderate pericardial effusion Echo with persistent moderate pericardial effusion, consistent with prior TEE on 04/02/2023. Cardiology recommending continue monitoring. Chest x-ray abnormality (Resolved: 05/13/2023) Procal trending down and clinically appears well, no indication for pneumonia treatment. Leg pain, bilateral Improved with gabapentin , will continue dosing after dialysis  Chronic and Stable Issues: Anemia - Stable, likely anemia of CKD Bipolar 1 disorder/Migraines - Continue Depakote  500 mg daily   FEN/GI: Renal Diet PPx:  Heparin  Dispo: Home pending clinical improvement  Subjective:  Assessed at bedside, continues to have headache and vision changes.  Consistent with prior and states his symptoms wax and wane.  Worried about his vision as he cares for 4 children at home, discussed ophthalmology outpatient care.  Objective: Temp:  [97.6 F (36.4 C)-98.5 F (36.9 C)] 97.7 F (36.5 C) (01/01 0221) Pulse Rate:  [88-105] 93 (01/01 0221) Resp:  [12-28] 20 (01/01 0221) BP: (156-204)/(94-138) 175/105 (01/01 0221) SpO2:  [90 %-100 %] 96 % (01/01 0221) Physical Exam: General: Sitting up in bed, NAD Cardiovascular: RRR without murmur Respiratory: CTAB.  Normal work of breathing on room air Abdomen: Soft, nontender, nondistended Extremities: No peripheral edema Neuro: Difficulty testing cranial nerves due to vision and hearing impairment.  5/5 shoulder flexion and hip flexion bilaterally.  Sensation intact globally.  Laboratory: Most recent CBC Lab Results  Component Value Date   WBC 4.5 05/12/2023   HGB 11.3 (L) 05/12/2023   HCT 34.2 (L) 05/12/2023   MCV 84.2 05/12/2023   PLT 226 05/12/2023   Most recent BMP    Latest Ref Rng & Units 05/12/2023    4:21 AM  BMP  Glucose 70 - 99 mg/dL 79   BUN 6 - 20 mg/dL 81   Creatinine 9.38 - 1.24 mg/dL 84.46   Sodium 864 - 854 mmol/L 128   Potassium 3.5 - 5.1 mmol/L 5.5   Chloride 98 - 111 mmol/L 88   CO2 22 - 32 mmol/L 21   Calcium  8.9 - 10.3 mg/dL 9.4     Other pertinent labs: None  Imaging/Diagnostic Tests: None in the past 24 hours  Edward Pagan, MD 05/13/2023, 8:49 AM  PGY-2, [facility] FPTS Intern pager: [phone], text pages welcome Secure chat group [facility] Teaching Service

## 2023-05-13 NOTE — Progress Notes (Signed)
 This nurse assessed HD acces this morning and noted the dressing to be clean, dry, and intact with biopatch in place. Patient called this nurse into room to look at dressing. Patient reports sweating and drooling in sleep causing dressing to partially come off exposing insertion site. As patient is explaining to this nurse what he believes happened, he is actively pulling at loose dressing. This nurse informed him not to touch the dressing and the risk for infection. Patient verbalized understanding, however continues to pull at dressing. IV team consult placed for dressing change.

## 2023-05-13 NOTE — Assessment & Plan Note (Addendum)
 Improved with gabapentin, will continue dosing after dialysis

## 2023-05-13 NOTE — Assessment & Plan Note (Signed)
 Procal trending down and clinically appears well, no indication for pneumonia treatment.

## 2023-05-13 NOTE — Assessment & Plan Note (Addendum)
 Echo with persistent moderate pericardial effusion, consistent with prior TEE on 04/02/2023. Cardiology recommending continue monitoring.

## 2023-05-13 NOTE — Progress Notes (Signed)
 [facility] Progress Note   Subjective:   BP improved post HD. Patient continues to complain of blurry vision. Reports headache off and on since yesterday. Denies CP, SOB, palpitations, dizziness.   Objective Vitals:   05/13/23 0125 05/13/23 0130 05/13/23 0221 05/13/23 0900  BP: (!) 172/103  (!) 175/105 (!) 152/100  Pulse: 100 96 93   Resp: 13 20 20 19   Temp: 98.5 F (36.9 C)  97.7 F (36.5 C) 98 F (36.7 C)  TempSrc: Oral  Oral Oral  SpO2: 100% 90% 96% 98%  Weight:      Height:       Physical Exam General: Alert male in NAD Heart: RRR, no murmurs, rubs or gallops Lungs: CTA bilaterally, respirations unlabored on RA Abdomen: Soft, non-distended, +BS Extremities: No edema b/l lower extremities Dialysis Access: R internal jugular [facility]  Additional Objective Labs: Basic Metabolic Panel: Recent Labs  Lab 05/11/23 0339 05/12/23 0421 05/13/23 0940  NA 130* 128* 130*  K 4.5 5.5* 4.2  CL 91* 88* 91*  CO2 23 21* 23  GLUCOSE 87 79 94  BUN 52* 81* 56*  CREATININE 13.46* 15.53* 11.61*  CALCIUM  9.2 9.4 9.4  PHOS 7.7* 9.0* 6.9*   Liver Function Tests: Recent Labs  Lab 05/08/23 1755 05/09/23 0836 05/11/23 0339 05/12/23 0421 05/13/23 0940  AST 21  --  19  --   --   ALT 12  --  11  --   --   ALKPHOS 88  --  113  --   --   BILITOT 0.7  --  0.7  --   --   PROT 6.2*  --  6.5  --   --   ALBUMIN  3.1*   < > 3.2* 3.3* 3.2*   < > = values in this interval not displayed.   No results for input(s): LIPASE, AMYLASE in the last 168 hours. CBC: Recent Labs  Lab 05/08/23 1755 05/09/23 0836 05/11/23 0339 05/12/23 0421  WBC 4.9 4.7 3.7* 4.5  NEUTROABS 3.5  --   --   --   HGB 11.1* 10.5* 11.3* 11.3*  HCT 33.5* 31.6* 34.1* 34.2*  MCV 84.0 84.5 84.6 84.2  PLT 141* 148* 170 226   Blood Culture    Component Value Date/Time   SDES BLOOD SITE NOT SPECIFIED 04/02/2023 1339   SDES BLOOD SITE NOT SPECIFIED 04/02/2023 1339   SPECREQUEST  04/02/2023 1339     BOTTLES DRAWN AEROBIC AND ANAEROBIC Blood Culture results may not be optimal due to an inadequate volume of blood received in culture bottles   SPECREQUEST  04/02/2023 1339    BOTTLES DRAWN AEROBIC AND ANAEROBIC Blood Culture results may not be optimal due to an inadequate volume of blood received in culture bottles   CULT  04/02/2023 1339    NO GROWTH 5 DAYS Performed at [facility], [address], [place], [place] [address]    CULT  04/02/2023 1339    NO GROWTH 5 DAYS Performed at [facility], [address], [place], [place] [address]    REPTSTATUS 04/07/2023 FINAL 04/02/2023 1339   REPTSTATUS 04/07/2023 FINAL 04/02/2023 1339    CBG: Recent Labs  Lab 05/09/23 1102  GLUCAP 91    Medications:  heparin  sodium (porcine)      carvedilol   25 mg Oral BID WC   Chlorhexidine  Gluconate Cloth  6 each Topical Q0600   divalproex   500 mg Oral Daily   heparin   5,000 Units Subcutaneous  Q8H   hydrALAZINE   100 mg Oral TID   minoxidil   10 mg Oral BID   NIFEdipine   60 mg Oral Daily   sevelamer  carbonate  1,600 mg Oral TID WC    Dialysis Orders: Center: [facility] T[facility], EDW 79.1    4-hour dialysis no heparin , 2K 2 calcium  bath No heparin , calcitriol  1.25 mcg p.o. q. dialysis Mircera 150 mcg every 2 weeks last given 12/17.   Assessment/Plan: AHRF:  volume overload pulmonary edema secondary to missed HD questionable BP med compliance. Wt is down ~8kg since admission, will need a lower EDW. On RA today and denies SOB HTN urgency: on admission, requiring IV antiHTN meds.  S/p significant UF since admission. Off IV BP meds now but still HTN.  Outpt dialysis BPs pre and post 190-200s typically.  Suspect med nonadherence. Currently on carvedilol , hydralazine , minoxidil , nifedipine  (dose increased 12/30). BP improved after HD last night, currently 152/100. If trends back up could consider increasing coreg .  ESRD -HD TTS HD(needs better compliance as outpatient standing full treatment, has been  ongoing issue with counseling). Had HD off schedule on 12/30 for UF, had HD again 12/31. Next HD tomorrow.  History of pericardial effusion:  moderate effusion observed on 03/2023 TTE, per cardiology/primary team Anemia  -Hgb 11.3, no ESA indicated at this time.  Metabolic bone disease -corrected calcium  elevated hold vitamin D  for now. Phos back up to 9.0, no binders on MAR at present. Restarting renvela .  History of Alport syndrome with bilateral hearing loss  Vision loss: patient has been reporting waxing and waning vision lost. Primary MD aware and reached out to ophthalmology, who recommended outpatient follow up.     Lucie Collet, PA-C 05/13/2023, 11:06 AM  [facility] Pager: [phone]

## 2023-05-13 NOTE — Progress Notes (Signed)
   05/13/23 0130  Vitals  Pulse Rate 96  ECG Heart Rate (!) 103  Resp 20  Oxygen Therapy  SpO2 90 %  During Treatment Monitoring  Blood Flow Rate (mL/min) 0 mL/min  Arterial Pressure (mmHg) 18.79 mmHg  Venous Pressure (mmHg) -1.21 mmHg  TMP (mmHg) -48.89 mmHg  Ultrafiltration Rate (mL/min) 1386 mL/min  Dialysate Flow Rate (mL/min) 0 ml/min  Duration of HD Treatment -hour(s) 3 hour(s)  Cumulative Fluid Removed (mL) per Treatment  3000.42  Intra-Hemodialysis Comments Tx completed  Post Treatment  Dialyzer Clearance Lightly streaked  Liters Processed 63.8  Fluid Removed (mL) 3000 mL  Tolerated HD Treatment Yes  Note  Patient Observations pt stable   Pt tolerated treatment

## 2023-05-13 NOTE — Discharge Instructions (Addendum)
 Dear [name] Pass,   Thank you for letting us  participate in your care! In this section, you will find a brief hospital admission summary of why you were admitted to the hospital, what happened during your admission, your diagnosis/diagnoses, and recommended follow up.   You are diagnosed with hypertensive emergency and given medication and dialysis to bring your blood pressure down.  Please follow-up with the ophthalmologist (eye doctor) for management of your vision changes.  Ophthalmologist: Dr. [facility] [address] [place], [place] 12/15/1985 Local: [phone] *You can walk into clinic without an appointment on Wednesday or Friday to be seen   POST-HOSPITAL & CARE INSTRUCTIONS We recommend following up with your PCP within 1 week from being discharged from the hospital. Please let PCP/Specialists know of any changes in medications that were made which you will be able to see in the medications section of this packet. Please also follow up with the ophthalmologist (Dr. Pecen) as above and your nephrologist.  DOCTOR'S APPOINTMENTS & FOLLOW UP Future Appointments  Date Time Provider Department Center  05/20/2023 10:30 AM [facility]  05/21/2023  2:50 PM Jennelle Riis, MD [facility]  06/01/2023  1:55 PM Wyn Jackee VEAR Raddle., NP [facility]     Thank you for choosing [facility]! Take care and be well!  Family Medicine Teaching Service Inpatient Team [place]  [facility]  [address] [place], [place] [address] [phone]

## 2023-05-13 NOTE — Assessment & Plan Note (Addendum)
 BP under much better control likely secondary to HD and medication adjustment.  Continued headache and vision changes, will obtain MRI brain to rule out intracranial pathology. -Appreciate nephro management of BP -Follow-up MRI brain -BP regimen: Coreg  25 BID, Hydralazine  100 mg TID, Nifedipine  60 mg daily and Minoxidil  10 mg BID

## 2023-05-13 NOTE — Progress Notes (Signed)
 Throughout the shift, patient has demanded fluids. This nurse and nurse tech have educated patient throughout shift about adhering to the fluid restriction and patient continues to demand fluids. As of 1818, [place] NT charted 1414 mL of fluid. MD made aware.

## 2023-05-13 NOTE — Assessment & Plan Note (Addendum)
 Appears euvolemic, resume TTS schedule.  Hyperkalemia resolved, will continue to monitor with daily RFP. -Nephrology following, appreciate care -Renal diet w/ 1200 mL fluid restriction -Trend RFP -Hold home losartan  in the setting of hyperkalemia per Nephro

## 2023-05-14 ENCOUNTER — Encounter: Payer: Self-pay | Admitting: *Deleted

## 2023-05-14 ENCOUNTER — Other Ambulatory Visit (HOSPITAL_COMMUNITY): Payer: Self-pay

## 2023-05-14 DIAGNOSIS — I10 Essential (primary) hypertension: Secondary | ICD-10-CM | POA: Diagnosis not present

## 2023-05-14 DIAGNOSIS — H9201 Otalgia, right ear: Secondary | ICD-10-CM | POA: Insufficient documentation

## 2023-05-14 LAB — RENAL FUNCTION PANEL
Albumin: 3.3 g/dL — ABNORMAL LOW (ref 3.5–5.0)
Anion gap: 18 — ABNORMAL HIGH (ref 5–15)
BUN: 79 mg/dL — ABNORMAL HIGH (ref 6–20)
CO2: 22 mmol/L (ref 22–32)
Calcium: 9.5 mg/dL (ref 8.9–10.3)
Chloride: 89 mmol/L — ABNORMAL LOW (ref 98–111)
Creatinine, Ser: 14.29 mg/dL — ABNORMAL HIGH (ref 0.61–1.24)
GFR, Estimated: 4 mL/min — ABNORMAL LOW (ref >60)
Glucose, Bld: 128 mg/dL — ABNORMAL HIGH (ref 70–99)
Phosphorus: 7.4 mg/dL — ABNORMAL HIGH (ref 2.5–4.6)
Potassium: 4.3 mmol/L (ref 3.5–5.1)
Sodium: 129 mmol/L — ABNORMAL LOW (ref 135–145)

## 2023-05-14 LAB — CBC
HCT: 31.5 % — ABNORMAL LOW (ref 39.0–52.0)
Hemoglobin: 10.4 g/dL — ABNORMAL LOW (ref 13.0–17.0)
MCH: 28.1 pg (ref 26.0–34.0)
MCHC: 33 g/dL (ref 30.0–36.0)
MCV: 85.1 fL (ref 80.0–100.0)
Platelets: 242 10*3/uL (ref 150–400)
RBC: 3.7 MIL/uL — ABNORMAL LOW (ref 4.22–5.81)
RDW: 15.7 % — ABNORMAL HIGH (ref 11.5–15.5)
WBC: 3.9 10*3/uL — ABNORMAL LOW (ref 4.0–10.5)
nRBC: 0 % (ref 0.0–0.2)

## 2023-05-14 MED ORDER — FLUTICASONE PROPIONATE 50 MCG/ACT NA SUSP
1.0000 | Freq: Every day | NASAL | 2 refills | Status: DC
  Filled 2023-05-14: qty 16, 60d supply, fill #0
  Filled 2023-05-14: qty 9.9, 30d supply, fill #0

## 2023-05-14 MED ORDER — NIFEDIPINE ER 60 MG PO TB24
60.0000 mg | ORAL_TABLET | Freq: Every day | ORAL | 0 refills | Status: DC
  Filled 2023-05-14: qty 30, 30d supply, fill #0

## 2023-05-14 MED ORDER — SEVELAMER CARBONATE 800 MG PO TABS
1600.0000 mg | ORAL_TABLET | Freq: Three times a day (TID) | ORAL | 0 refills | Status: DC
  Filled 2023-05-14: qty 180, 30d supply, fill #0

## 2023-05-14 MED ORDER — HEPARIN SODIUM (PORCINE) 1000 UNIT/ML IJ SOLN: 3200.0000 [IU] | Freq: Once | INTRAMUSCULAR | Status: DC

## 2023-05-14 MED ORDER — AMOXICILLIN 500 MG PO CAPS
500.0000 mg | ORAL_CAPSULE | Freq: Two times a day (BID) | ORAL | Status: DC
Start: 2023-05-14 — End: 2023-05-14
  Administered 2023-05-14: 500 mg via ORAL
  Filled 2023-05-14: qty 1

## 2023-05-14 MED ORDER — GABAPENTIN 100 MG PO CAPS
ORAL_CAPSULE | ORAL | 0 refills | Status: DC
  Filled 2023-05-14: qty 30, 60d supply, fill #0

## 2023-05-14 MED ORDER — MINOXIDIL 10 MG PO TABS
10.0000 mg | ORAL_TABLET | Freq: Two times a day (BID) | ORAL | 0 refills | Status: DC
  Filled 2023-05-14: qty 57, 29d supply, fill #0

## 2023-05-14 MED ORDER — AMOXICILLIN 500 MG PO CAPS
500.0000 mg | ORAL_CAPSULE | Freq: Two times a day (BID) | ORAL | 0 refills | Status: AC
  Filled 2023-05-14: qty 9, 5d supply, fill #0

## 2023-05-14 MED ORDER — HEPARIN SODIUM (PORCINE) 1000 UNIT/ML IJ SOLN
INTRAMUSCULAR | Status: AC
  Filled 2023-05-14: qty 4

## 2023-05-14 NOTE — Procedures (Signed)
 I was present at this dialysis session. I have reviewed the session itself and made appropriate changes.   Vital signs in last 24 hours:  Temp:  [98.2 F (36.8 C)-98.4 F (36.9 C)] 98.2 F (36.8 C) (01/02 0505) Pulse Rate:  [93-106] 93 (01/02 0505) Resp:  [18] 18 (01/02 0505) BP: (148-164)/(94-100) 164/94 (01/02 0505) SpO2:  [97 %-100 %] 97 % (01/02 0505) Weight change:  Filed Weights   05/10/23 0400 05/11/23 0848 05/11/23 1117  Weight: 75.7 kg 75.7 kg 74.8 kg    Recent Labs  Lab 05/13/23 0940  NA 130*  K 4.2  CL 91*  CO2 23  GLUCOSE 94  BUN 56*  CREATININE 11.61*  CALCIUM  9.4  PHOS 6.9*    Recent Labs  Lab 05/08/23 1755 05/09/23 0836 05/11/23 0339 05/12/23 0421  WBC 4.9 4.7 3.7* 4.5  NEUTROABS 3.5  --   --   --   HGB 11.1* 10.5* 11.3* 11.3*  HCT 33.5* 31.6* 34.1* 34.2*  MCV 84.0 84.5 84.6 84.2  PLT 141* 148* 170 226    Scheduled Meds:  carvedilol   25 mg Oral BID WC   Chlorhexidine  Gluconate Cloth  6 each Topical Q0600   divalproex   500 mg Oral Daily   heparin   5,000 Units Subcutaneous Q8H   hydrALAZINE   100 mg Oral TID   minoxidil   10 mg Oral BID   NIFEdipine   60 mg Oral Daily   sevelamer  carbonate  1,600 mg Oral TID WC   Continuous Infusions: PRN Meds:.acetaminophen  **OR** acetaminophen , famotidine , prochlorperazine    Fairy DELENA Sellar,  MD 05/14/2023, 9:08 AM

## 2023-05-14 NOTE — Discharge Summary (Addendum)
 Family Medicine Teaching [facility] Discharge Summary  Patient name: [name] Medical record number: [id number] Date of birth: [birth year] Age: 38 y.o. Gender: male Date of Admission: 05/08/2023  Date of Discharge: 05/13/2022 Admitting Physician: Claudetta Silence, MD  Primary Care Provider: Joshua Domino, DO Consultants: Nephrology  Indication for Hospitalization: Hypertensive emergency  Discharge Diagnoses/Problem List:  Principal Problem for Admission: Hypertensive emergency Other Problems addressed during stay:  Principal Problem:   Severe uncontrolled hypertension Active Problems:   Moderate pericardial effusion   ESRD needing dialysis   Leg pain, bilateral   Ear pain, right    Brief Hospital Course:  [name] is a 38 y.o. male with PMHx Alport syndrome, bilateral hearing loss, ESRD on HD, HFmrEF, bipolar 1, GERD, resistant hypertension and anemia of chronic disease who was admitted to the [facility] Medicine Teaching Service at [facility] for hypertensive emergency. Hospital course is outlined below by problem.   Hypertensive emergency Presented with profound hypertension and volume overload in the setting of leaving HD early with concommitment headache and vision changes.  Admitted to the ICU for Cleviprex  drip was subsequently weaned on 12/29 he was transferred to [facility] service.  Nephrology continue to titrate BP meds and with additional HD sessions, BP improved.  Waxing and waning visual changes, ophthalmology recommended outpatient follow-up with retina specialist.  MRI brain showed no acute intracranial abnormality.  ESRD on HD Presented with gross hypervolemia and pulmonary edema likely in the setting of leaving HD early.  Underwent emergent HD and nephrology consulted with subsequent make up HD sessions on 12/28 and 12/30 and then resume TTS schedule.  Severe leg pain particularly during dialysis, trial on gabapentin  100 mg after HD with symptomatic  improvement.  Acute hypoxic respiratory failure Initial oxygen requirement likely in the setting of pulmonary edema as above.  CXR initially concerning for consolidation and procal elevated but clinically well-appearing therefore did not treat for PNA.  Subsequently weaned off oxygen with additional HD sessions.  Moderate pericardial effusion Repeat echo showed moderate pericardial effusion first identified on TEE on 04/02/2023.  Per cardiology, no further management needed at this time.  Right Sided Otitis Media Patient complained of ear pain prior to discharge, and had imaging and physical exam support for infection. He will complete a 5 day course of Amoxicillin  500 mg BID.   Other conditions that were chronic and stable (anemia, bipolar 1, migraines)  Issues for follow up: Outpatient ophthalmology follow-up for vision changes [facility], Dr. 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 L EAC and TM grossly normal Cardiac: RRR, no m/r/g Respiratory: CTAB, no increased WOB Abdomen: Flat, soft, nontender Extremities: 2+ peripheral pulses in the bilateral upper extremity  Significant Procedures: HD  Significant Labs and Imaging:  Recent Labs  Lab 05/14/23 0654  WBC 3.9*  HGB 10.4*  HCT 31.5*  PLT 242   Recent Labs  Lab 05/13/23 0940 05/14/23 1126  NA 130* 129*  K 4.2 4.3  CL 91* 89*  CO2 23 22  GLUCOSE 94 128*  BUN 56* 79*  CREATININE 11.61* 14.29*   CALCIUM  9.4 9.5  PHOS 6.9* 7.4*  ALBUMIN  3.2* 3.3*    None  Results/Tests Pending at Time of Discharge: none  Discharge Medications:  Allergies as of 05/14/2023       Reactions   Zestril [lisinopril] Swelling   Nsaids Other (See Comments)   Kidney problems    Risperdal  [risperidone ] Other (See Comments)   Unknown reaction         Medication List     STOP taking these medications    losartan  100 MG tablet Commonly known as: COZAAR    tiZANidine  2 MG tablet Commonly known as: ZANAFLEX        TAKE these medications    amoxicillin  500 MG capsule Commonly known as: AMOXIL  Take 1 capsule (500 mg total) by mouth every 12 (twelve) hours for 5 days.   calcium  acetate 667 MG capsule Commonly known as: PHOSLO  Take 1 capsule (667 mg total) by mouth 3 (three) times daily with meals.   carvedilol  25 MG tablet Commonly known as: COREG  Take 1 tablet (25 mg total) by mouth 2 (two) times daily with a meal.   divalproex  500 MG 24 hr tablet Commonly known as: DEPAKOTE  ER Take 1 tablet (500 mg total) by mouth daily.   famotidine  20 MG tablet Commonly known as: PEPCID  Take 1 tablet (20 mg total) by mouth daily as needed for indigestion or heartburn.   fluticasone  50 MCG/ACT nasal spray Commonly known as: FLONASE  Place 1 spray into both nostrils daily.   gabapentin  100 MG capsule Commonly known as: Neurontin  Please take 100 mg after dialysis on Tuesday, Thursday and Saturday   hydrALAZINE  100 MG tablet Commonly known as: APRESOLINE  Take 1 tablet (100 mg total) by mouth 3 (three) times daily.   minoxidil  10 MG tablet Commonly known as: LONITEN  Take 1 tablet (10 mg total) by mouth 2 (two) times daily. What changed: how much to take   MIRCERA IJ Inject 1 Dose into the skin every 14 (fourteen) days.   NIFEdipine  60 MG 24 hr tablet Commonly known as: ADALAT  CC Take 1 tablet (60 mg total) by mouth daily. What changed:  medication strength how much to take    pantoprazole  40 MG tablet Commonly known as: PROTONIX  Take 1 tablet (40 mg total) by mouth daily.   sevelamer  carbonate 800 MG tablet Commonly known as: RENVELA  Take 2 tablets (1,600 mg total) by mouth 3 (three) times daily with meals. What changed: how much to take        Discharge Instructions: Please refer to Patient Instructions section of EMR for full details.  Patient was counseled important signs and symptoms that should prompt return to medical care, changes in medications, dietary instructions, activity restrictions, and follow up appointments.   Follow-Up Appointments:  Follow-up Information     [facility] Follow up.   Specialty: Home Health Services Why: The  home health agency will contact you for the first home visit. Contact information: [address] [place] [address] [phone]         [facility] Follow up on 05/21/2023.   Why: 05/21/2023 @ 2:50PM Contact information: [address] [place]  [phone]        [facility] [place] Follow up.   Why: Call and make an appointment, or ask for a walk-in appointment on a Wednesday or  Friday. Must be with Dr. Pecen specifically. Contact information: [address] [place] 02/24/1986 [phone]                 Cleotilde Lukes, DO 05/14/2023, 1:53 PM PGY-1, [place]   Upper Level Addendum: I have seen and evaluated this patient along with Dr. Cleotilde and reviewed the above note, making necessary revisions as appropriate. I agree with the medical decision making and physical exam as noted above. Penne Rhein, MD PGY-3 [facility] Family Medicine Residency

## 2023-05-14 NOTE — Progress Notes (Signed)
 Pt being discharged, SWOT RN did education and paperwork, VVS. HD Cath remains in place.   Balinda Quails, RN 05/14/2023 3:24 PM

## 2023-05-14 NOTE — Assessment & Plan Note (Addendum)
 BP high prior to HD this morning, meds will be passed following HD.  Otherwise stable and ready for discharge. -Appreciate nephro management of BP -Follow-up MRI brain -BP regimen: Coreg  25 BID, Hydralazine  100 mg TID, Nifedipine  60 mg daily and Minoxidil  10 mg BID

## 2023-05-14 NOTE — Assessment & Plan Note (Signed)
 Improved with gabapentin, will continue dosing after dialysis

## 2023-05-14 NOTE — Progress Notes (Addendum)
 DISCHARGE NOTE HOME [name] to be discharged Home per MD order. Discussed prescriptions and follow up appointments with the patient. Prescriptions given to patient; medication list explained in detail. Patient verbalized understanding.  Skin clean, dry and intact without evidence of skin break down, no evidence of skin tears noted. IV catheter discontinued intact. Site without signs and symptoms of complications. Dressing and pressure applied. Pt denies pain at the site currently. No complaints noted.  Discharging with HD cath.  Patient free of other lines, drains, and wounds.   TOC meds delivered to Patient in room.  An After Visit Summary (AVS) was printed and given to the patient. Patient will be escorted via wheelchair, and discharged home via private auto by floor personnel.  [name], RN

## 2023-05-14 NOTE — Assessment & Plan Note (Signed)
 Echo with persistent moderate pericardial effusion, consistent with prior TEE on 04/02/2023. Cardiology recommending continue monitoring.

## 2023-05-14 NOTE — Assessment & Plan Note (Signed)
 Patient endorses ear pain today.  Of note did have MRI yesterday which showed large cyst blocking eustachian tube.  Would be appropriate to refer to ENT outpatient

## 2023-05-14 NOTE — Consult Note (Signed)
 [facility] Liaison Consult Note    05/14/2023  [name] 1986/05/01 [id number]  [facility] Care Institute Patient: Active with [facility]BCI LCSW prior to admission.  Primary Care Provider:  Joshua Domino, DO with [facility] Medicine, this provider is listed to provide the community transition of care follow up and [facility] calls  Insurance: Medicare ACO REACH  Patient is currently active with [facility] for care coordination services.  Patient has been engaged by a Child Psychotherapist.  The community based plan of care has focused on disease management and community resource support.    Patient will receive a post hospital call and will be evaluated for assessments and disease process education.    Plan: Continue to follow for any additional community care coordination needs for post hospital/community needs. Will notify LCSW of admission and post hospital follow up.   Of note, [facility] services does not replace or interfere with any services that are needed or arranged by inpatient [facility] care management team.   Richerd Fish, RN, BSN, CCM [place]  [facility], [facility] Liaison Direct Dial : [phone] or secure chat Email: [email or web address]

## 2023-05-14 NOTE — Plan of Care (Signed)
  Problem: Health Behavior/Discharge Planning: Goal: Ability to manage health-related needs will improve Outcome: Progressing   Problem: Clinical Measurements: Goal: Ability to maintain clinical measurements within normal limits will improve Outcome: Progressing Goal: Will remain free from infection Outcome: Progressing Goal: Diagnostic test results will improve Outcome: Progressing Goal: Respiratory complications will improve Outcome: Progressing Goal: Cardiovascular complication will be avoided Outcome: Progressing   Problem: Activity: Goal: Risk for activity intolerance will decrease Outcome: Progressing   Problem: Nutrition: Goal: Adequate nutrition will be maintained Outcome: Progressing   Problem: Coping: Goal: Level of anxiety will decrease Outcome: Progressing   Problem: Elimination: Goal: Will not experience complications related to bowel motility Outcome: Progressing Goal: Will not experience complications related to urinary retention Outcome: Progressing   Problem: Pain Management: Goal: General experience of comfort will improve Outcome: Progressing   Problem: Safety: Goal: Ability to remain free from injury will improve Outcome: Progressing   Problem: Skin Integrity: Goal: Risk for impaired skin integrity will decrease Outcome: Progressing   Problem: Fluid Volume: Goal: Compliance with measures to maintain balanced fluid volume will improve Outcome: Progressing   Problem: Health Behavior/Discharge Planning: Goal: Ability to manage health-related needs will improve Outcome: Progressing   Problem: Nutritional: Goal: Ability to make healthy dietary choices will improve Outcome: Progressing   Problem: Clinical Measurements: Goal: Complications related to the disease process, condition or treatment will be avoided or minimized Outcome: Progressing

## 2023-05-14 NOTE — Progress Notes (Signed)
 D/C order noted. Contacted [facility] to be advised of pt's d/c today and that pt should resume care on Saturday.   Olivia Canter Renal Navigator [phone]

## 2023-05-14 NOTE — Assessment & Plan Note (Addendum)
 HD today, can discharge after HD. -Nephrology following, appreciate care -Renal diet w/ 1200 mL fluid restriction -Trend RFP -Hold home losartan in the setting of hyperkalemia per Nephro

## 2023-05-14 NOTE — TOC Transition Note (Signed)
 Transition of Care [facility]) - Discharge Note   Patient Details  Name: [name] MRN: [id number] Date of Birth: 02-17-1986  Transition of Care [facility]) CM/SW Contact:  Andrez JULIANNA George, RN Phone Number: 05/14/2023, 2:20 PM   Clinical Narrative:     Pt is discharging home with home health services through [facility]. Information on the AVS.  Pt's discharge medications to be filled through [facility] pharmacy.  Pt has transportation home.  Final next level of care: Home w Home Health Services Barriers to Discharge: No Barriers Identified   Patient Goals and CMS Choice   CMS Medicare.gov Compare Post Acute Care list provided to:: Patient Choice offered to / list presented to : Patient      Discharge Placement                       Discharge Plan and Services Additional resources added to the After Visit Summary for     Discharge Planning Services: CM Consult Post Acute Care Choice: Home Health                    HH Arranged: PT [facility] Agency: [facility] Date [facility] Agency Contacted: 05/11/23   Representative spoke with at [facility] Agency: [place]  Social Drivers of Health (SDOH) Interventions SDOH Screenings   Food Insecurity: No Food Insecurity (05/11/2023)  Housing: High Risk (05/11/2023)  Transportation Needs: No Transportation Needs (05/11/2023)  Utilities: Not At Risk (05/11/2023)  Depression (PHQ2-9): Medium Risk (03/24/2023)  Physical Activity: Inactive (03/24/2023)  Social Connections: Unknown (09/20/2021)   [facility]  Stress: Stress Concern Present (03/24/2023)  Tobacco Use: High Risk (04/20/2023)     Readmission Risk Interventions     No data to display

## 2023-05-14 NOTE — Progress Notes (Signed)
     Daily Progress Note Intern Pager: [phone]  Patient name: [name] Medical record number: [id number] Date of birth: [birth year] Age: 38 y.o. Gender: male  Primary Care Provider: Joshua Domino, DO Consultants: Nephrology Code Status: Full  Pt Overview and Major Events to Date:  12/27: Admitted to ICU, received emergent HD 12/29: weaned of cleviprex  drip 12/30: transferred to FMTS  Assessment and Plan:  [name] is a 38 yo male with a history of Alport Syndrome, ESRD on HD, hearing impairment and difficult to control HTN presenting with hypertensive emergency in the setting of missed HD. He is now back at baseline.  Assessment & Plan Severe uncontrolled hypertension BP high prior to HD this morning, meds will be passed following HD.  Otherwise stable and ready for discharge. -Appreciate nephro management of BP -Follow-up MRI brain -BP regimen: Coreg  25 BID, Hydralazine  100 mg TID, Nifedipine  60 mg daily and Minoxidil  10 mg BID ESRD needing dialysis HD today, can discharge after HD. -Nephrology following, appreciate care -Renal diet w/ 1200 mL fluid restriction -Trend RFP -Hold home losartan  in the setting of hyperkalemia per Nephro Moderate pericardial effusion Echo with persistent moderate pericardial effusion, consistent with prior TEE on 04/02/2023. Cardiology recommending continue monitoring. Leg pain, bilateral Improved with gabapentin , will continue dosing after dialysis Ear pain, right Patient endorses ear pain today.  Of note did have MRI yesterday which showed large cyst blocking eustachian tube.  Would be appropriate to refer to ENT outpatient  Chronic and Stable Problems:  Anemia: Stable, likely anemia of CKD Bipolar 1 disorder/migraines: Continue Depakote  500 mg daily  FEN/GI: Renal diet PPx: Heparin  Dispo:Home  after HD .   Subjective:  Patient is awake sitting up in bed on exam this morning.  He reports that his only complaints are pain  associated with his catheter as well as some right ear pain.  Objective: Temp:  [98 F (36.7 C)-98.4 F (36.9 C)] 98.2 F (36.8 C) (01/02 0505) Pulse Rate:  [93-106] 93 (01/02 0505) Resp:  [18-19] 18 (01/02 0505) BP: (148-164)/(94-100) 164/94 (01/02 0505) SpO2:  [97 %-100 %] 97 % (01/02 0505) Physical Exam: General: Well-appearing well-nourished no distress HEENT: Left ear patent EAC, no erythema or abrasions.  TM gray pearly nonbulging.  Right ear EAC clear, TM not visualized as canal is blocked by hearing aid. Cardiovascular: RRR, no M/R/G Respiratory: CTAB, no increased work of breathing Abdomen: Flat, soft, nontender Extremities: 2+ pulses all 4 extremities.  No evidence of peripheral edema  Laboratory: Most recent CBC Lab Results  Component Value Date   WBC 4.5 05/12/2023   HGB 11.3 (L) 05/12/2023   HCT 34.2 (L) 05/12/2023   MCV 84.2 05/12/2023   PLT 226 05/12/2023   Most recent BMP    Latest Ref Rng & Units 05/13/2023    9:40 AM  BMP  Glucose 70 - 99 mg/dL 94   BUN 6 - 20 mg/dL 56   Creatinine 9.38 - 1.24 mg/dL 88.38   Sodium 864 - 854 mmol/L 130   Potassium 3.5 - 5.1 mmol/L 4.2   Chloride 98 - 111 mmol/L 91   CO2 22 - 32 mmol/L 23   Calcium  8.9 - 10.3 mg/dL 9.4     Edward Lukes, DO 05/14/2023, 7:29 AM  PGY-1, [facility] FPTS Intern pager: [phone], text pages welcome Secure chat group [facility] Teaching Service

## 2023-05-14 NOTE — Progress Notes (Signed)
   05/14/23 1325  Vitals  Temp 98.2 F (36.8 C)  Temp Source Oral  BP (!) 160/104  MAP (mmHg) 117  BP Location Left Arm  BP Method Automatic  Patient Position (if appropriate) Lying  Pulse Rate (!) 109  Pulse Rate Source Monitor  ECG Heart Rate (!) 109  Resp 15  Oxygen Therapy  SpO2 99 %  O2 Device Room Air  During Treatment Monitoring  Blood Flow Rate (mL/min) 0 mL/min  Arterial Pressure (mmHg) -2.63 mmHg  Venous Pressure (mmHg) -2.02 mmHg  TMP (mmHg) -50.1 mmHg  Ultrafiltration Rate (mL/min) 1058 mL/min  Dialysate Flow Rate (mL/min) 300 ml/min  Duration of HD Treatment -hour(s) 3.5 hour(s)  Cumulative Fluid Removed (mL) per Treatment  3000.18  Post Treatment  Dialyzer Clearance Lightly streaked  Hemodialysis Intake (mL) 0 mL  Liters Processed 77.3  Fluid Removed (mL) 3000 mL  Tolerated HD Treatment Yes  Post-Hemodialysis Comments tolerated well goal met  Hemodialysis Catheter Right Internal jugular Double lumen Permanent (Tunneled)  Placement Date/Time: 04/03/23 1228   Serial / Lot #: [id number]  Expiration Date: 10/10/27  Time Out: Correct patient;Correct site;Correct procedure  Maximum sterile barrier precautions: Hand hygiene;Cap;Mask;Sterile gown;Sterile gloves;Large sterile s...  Site Condition No complications  Blue Lumen Status Dead end cap in place  Red Lumen Status Dead end cap in place  Purple Lumen Status N/A  Catheter fill solution Heparin  1000 units/ml  Catheter fill volume (Arterial) 1.6 cc  Catheter fill volume (Venous) 1.6  Dressing Type Transparent  Dressing Status Antimicrobial disc in place  Drainage Description None  Dressing Change Due 05/21/23  Post treatment catheter status Capped and Clamped   Received patient in bed to unit.  Alert and oriented.  Informed consent signed and in chart.   TX duration:3.5  Patient tolerated well.  Transported back to the room  Alert, without acute distress.  Hand-off given to patient's nurse.   Access  used: R HD  Catheter Access issues: none  Total UF removed: 3000 Medication(s) given: none Post HD VS: see above Post HD weight: 77.3   [name]

## 2023-05-15 ENCOUNTER — Telehealth: Payer: Self-pay

## 2023-05-15 ENCOUNTER — Telehealth: Payer: Self-pay | Admitting: Nephrology

## 2023-05-15 NOTE — Discharge Planning (Signed)
 [facility] Patient Discharge Orders- [facility] CLINIC: [facility]  Patient's name: [name] Admit/DC Dates: 05/08/2023 - 05/14/2023  Discharge Diagnoses: Severe Uncontrolled Hypertension   Moderate pericardial effusion  Aranesp : Given: no   Date and amount of last dose: n/a  Last Hgb: 10.4 PRBC's Given: no Date/# of units: n/a ESA dose for discharge: mircera 150 mcg IV q 2 weeks  IV Iron  dose at discharge: none  Heparin  change: no  EDW Change: Yes New EDW: 75.7kg  Bath Change: no  Access intervention/Change: no Details:  Hectorol/Calcitriol  change: Yes- decrease calcitriol  to 0.25mcg PO q HD  Discharge Labs: Calcium  9.5 Phosphorus 7.4 Albumin  3.3 K+ 4.3  IV Antibiotics: no Details:  On Coumadin?: no Last INR: Next INR: Managed By:   OTHER/APPTS/LAB ORDERS:    D/C Meds to be reconciled by nurse after every discharge.  Completed By: Lucie Collet, PA-C 05/15/2023, 8:32 AM  [facility] Pager: [phone]    Reviewed by: MD:______ RN_______

## 2023-05-15 NOTE — Transitions of Care (Post Inpatient/ED Visit) (Signed)
   05/15/2023  Name: [name] MRN: [id number] DOB: 1986-03-08  Today's TOC FU Call Status: Today's TOC FU Call Status:: Unsuccessful Call (1st Attempt) Unsuccessful Call (1st Attempt) Date: 05/15/23  Attempted to reach the patient regarding the most recent Inpatient/ED visit.  Follow Up Plan: Additional outreach attempts will be made to reach the patient to complete the Transitions of Care (Post Inpatient/ED visit) call.   Barnie Gowda RN, BSN, CCM RN Care Manager  Transitions of Care  [facility] - [facility]  [phone]

## 2023-05-15 NOTE — Telephone Encounter (Signed)
 Transition of care contact from inpatient facility  Date of Discharge: 05/14/23 Date of Contact: 05/15/23 Method of contact: Phone  Attempted to contact patient to discuss transition of care from inpatient admission. Patient did not answer the phone. Will continue outreach attempts.

## 2023-05-16 ENCOUNTER — Telehealth: Payer: Self-pay | Admitting: Nephrology

## 2023-05-16 NOTE — Telephone Encounter (Signed)
 Transition of Care - Initial Contact from Inpatient Facility  Date of discharge: 05/14/23 Date of contact: 05/16/23 Method: Phone Spoke to: Patient's POA Montie   Patient contacted to discuss transition of care from recent inpatient hospitalization. Patient was admitted to [facility] from 05/08/23-05/14/23  with discharge diagnosis of hypertensive emergency  The discharge medication list was reviewed.   Patient will return to his/her outpatient HD unit on: He went to his scheduled dialysis today, Sat  No other concerns at this time.

## 2023-05-17 LAB — OXYCODONES,MS,WB/SP RFX
Oxycocone: NEGATIVE ng/mL
Oxycodones Confirmation: NEGATIVE
Oxymorphone: NEGATIVE ng/mL

## 2023-05-19 ENCOUNTER — Telehealth: Payer: Self-pay

## 2023-05-19 LAB — THC,MS,WB/SP RFX
Cannabidiol: NEGATIVE ng/mL
Cannabinoid Confirmation: POSITIVE
Carboxy-THC: 28.7 ng/mL
Hydroxy-THC: NEGATIVE ng/mL
Tetrahydrocannabinol(THC): 1.4 ng/mL

## 2023-05-19 LAB — DRUG SCREEN 10 W/CONF, SERUM
Amphetamines, IA: NEGATIVE ng/mL
Barbiturates, IA: NEGATIVE ug/mL
Benzodiazepines, IA: NEGATIVE ng/mL
Cocaine & Metabolite, IA: NEGATIVE ng/mL
Methadone, IA: NEGATIVE ng/mL
Opiates, IA: NEGATIVE ng/mL
Oxycodones, IA: NEGATIVE ng/mL
Phencyclidine, IA: NEGATIVE ng/mL
Propoxyphene, IA: NEGATIVE ng/mL
THC(Marijuana) Metabolite, IA: POSITIVE ng/mL — AB

## 2023-05-19 NOTE — Transitions of Care (Post Inpatient/ED Visit) (Signed)
   05/19/2023  Name: [name] MRN: [id number] DOB: 1986/04/29  Today's TOC FU Call Status: Today's TOC FU Call Status:: Successful TOC FU Call Completed TOC FU Call Complete Date: 05/19/23 Patient's Name and Date of Birth confirmed.  Transition Care Management Follow-up Telephone Call Date of Discharge: 05/14/23 Discharge Facility: [facility]) Type of Discharge: Inpatient Admission Primary Inpatient Discharge Diagnosis:: Hypertensive Emergency How have you been since you were released from the hospital?: Better (Per patient's Mother in [name]) Any questions or concerns?: No  Items Reviewed: Did you receive and understand the discharge instructions provided?: Yes Medications obtained,verified, and reconciled?: No Medications Not Reviewed Reasons:: Other: (Unable to speak with patient,) Any new allergies since your discharge?: No Dietary orders reviewed?: No Do you have support at home?: Yes People in Home: parent(s) Name of Support/Comfort Primary Source: [name]  Medications Reviewed Today: Medications Reviewed Today   Medications were not reviewed in this encounter     Home Care and Equipment/Supplies: Were Home Health Services Ordered?: NA Any new equipment or medical supplies ordered?: NA  Functional Questionnaire: Do you need assistance with bathing/showering or dressing?: No Do you need assistance with meal preparation?: No Do you need assistance with eating?: No Do you have difficulty maintaining continence: No Do you need assistance with getting out of bed/getting out of a chair/moving?: No Do you have difficulty managing or taking your medications?: No  Follow up appointments reviewed: PCP Follow-up appointment confirmed?: Yes Date of PCP follow-up appointment?: 05/21/23 Follow-up Provider: Dr. Jennelle Specialist [facility] Follow-up appointment confirmed?: Yes Date of Specialist follow-up appointment?: 05/21/23 Follow-Up Specialty  Provider:: Dr. Wyn Do you need transportation to your follow-up appointment?: No Do you understand care options if your condition(s) worsen?: Yes-patient verbalized understanding  SDOH Interventions Today    Flowsheet Row Most Recent Value  SDOH Interventions   Food Insecurity Interventions Intervention Not Indicated  Housing Interventions Community Resources Provided  Transportation Interventions Intervention Not Indicated  Utilities Interventions Intervention Not Indicated      TOC Interventions Today    Flowsheet Row Most Recent Value  TOC Interventions   TOC Interventions Discussed/Reviewed TOC Interventions Discussed, TOC Interventions Reviewed  [Provided [name] phone number for the [facility] in [place] [phone]).]     Patient cannot hear on the phone, he needs to have text messages.  Mother in [name] is working on getting him assistance with communication (see interventions). Barnie Gowda RN, BSN, CCM RN Care Manager  Transitions of Care  [facility]  [phone]

## 2023-05-20 ENCOUNTER — Ambulatory Visit: Payer: Medicare Other | Attending: Nurse Practitioner

## 2023-05-21 ENCOUNTER — Inpatient Hospital Stay: Payer: Self-pay | Admitting: Student

## 2023-05-21 NOTE — Progress Notes (Deleted)
  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 Russell Mcalpine cyst  Needs Referral to ENT  PERTINENT  PMH / PSH: ***  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   Heart murmur    Hypertension    Low blood sugar    Marijuana abuse    Noncompliance    Pericardial effusion 03/28/2023   Tobacco abuse    OBJECTIVE:  There were no vitals taken for this visit. Physical Exam   ASSESSMENT/PLAN:   Assessment & Plan  No follow-ups on file. Penne Rhein, MD 05/21/2023, 6:51 AM PGY-***, [facility] {    This will disappear when note is signed, click to select method of visit    :1}

## 2023-05-25 ENCOUNTER — Telehealth: Payer: Self-pay

## 2023-05-25 NOTE — Telephone Encounter (Signed)
 Received call from [place], PT at [facility] regarding home health evaluation.   She reports that patient cannot be enrolled in home health PT, as patient is not house bound.   They are requesting referral for outpatient PT.   Will forward request to PCP.   Chiquita JAYSON English, RN

## 2023-05-27 ENCOUNTER — Ambulatory Visit: Payer: Self-pay | Admitting: Licensed Clinical Social Worker

## 2023-05-27 ENCOUNTER — Other Ambulatory Visit: Payer: Self-pay | Admitting: Nurse Practitioner

## 2023-05-27 DIAGNOSIS — Z9189 Other specified personal risk factors, not elsewhere classified: Secondary | ICD-10-CM

## 2023-05-27 DIAGNOSIS — I10 Essential (primary) hypertension: Secondary | ICD-10-CM

## 2023-05-27 DIAGNOSIS — Z720 Tobacco use: Secondary | ICD-10-CM

## 2023-05-27 DIAGNOSIS — I5020 Unspecified systolic (congestive) heart failure: Secondary | ICD-10-CM

## 2023-05-27 DIAGNOSIS — N186 End stage renal disease: Secondary | ICD-10-CM

## 2023-05-27 DIAGNOSIS — R0602 Shortness of breath: Secondary | ICD-10-CM

## 2023-05-27 DIAGNOSIS — R079 Chest pain, unspecified: Secondary | ICD-10-CM

## 2023-05-27 NOTE — Patient Outreach (Signed)
  Care Coordination   05/27/2023 Name: [name] MRN: [id number] DOB: [birth year]   Care Coordination Outreach Attempts:  An unsuccessful telephone outreach was attempted today to offer the patient information about available complex care management services.  Follow Up Plan:  Additional outreach attempts will be made to offer the patient complex care management information and services.   Encounter Outcome:  No Answer   Care Coordination Interventions:  No, not indicated     Alexandria Angel  MSW, LCSW [place] Licensed Clinical Social Worker Direct Dial :  [phone] Fax:  [phone] Website:  [email or web address]

## 2023-05-28 ENCOUNTER — Ambulatory Visit: Payer: Medicare Other | Attending: Nurse Practitioner

## 2023-06-01 ENCOUNTER — Ambulatory Visit: Payer: Medicare Other | Admitting: Nurse Practitioner

## 2023-06-02 ENCOUNTER — Ambulatory Visit: Payer: Self-pay | Admitting: Licensed Clinical Social Worker

## 2023-06-02 NOTE — Patient Instructions (Signed)
Visit Information  Thank you for taking time to visit with me today. Please don't hesitate to contact me if I can be of assistance to you.   Following are the goals we discussed today:   Goals Addressed             This Visit's Progress    patient has stress related to managing his medical needs       Interventions:  Spoke via phone with [name], mother in law, contact for client, about client needs and current status Discussed program support for client with RN, LCSW, Pharmacist.  Client has been residing at home of one of his relatives.  [employer] is trying to help client locate an apartment where he can reside on his own [name] said client is going to dialysis regularly now. Client goes to dialysis treatments on Tuesdays, Thursdays and Saturdays. Client does drive himself to and from dialysis appointments Spoke of client medication procurement .   Discussed mood of client. Client is anxious about medical issues faced.  He does go to dialysis weekly as scheduled. He is trying to get on kidney transplant list with [facility] in [place], [place], [place]  Also client is divorced and so social support is somewhat limited. He does have support from his mother in law [name] , who is patient contact [name] said client is not always compliant with his diet plan.  She tries to speak with client about client care needs.  She said he has not been able to qualify to get on kidney transplant list at [facility].  She said client would have to have no hospitalizations in a 6 month period and be more stable medically to seek to qualify for kidney transplant list Discussed pain issues of client. Discussed sleeping issues of client [name] said client can take in 32 ounces of fluid per day (recommended for client) Feliciana Rossetti for phone call with LCSW. Encouraged client or [name] to call LCSW as needed for SW support for client at [phone].  [name] was  appreciative of call from LCSW .            Follow Up Plan:  Client and [name] have LCSW name and phone number and can call LCSW as needed for SW support for client  Please call the care guide team at [phone] if you need to cancel or reschedule your appointment.   If you are experiencing a Mental Health or Behavioral Health Crisis or need someone to talk to, please go to [facility] [address] [place] [phone])   The patient  / [name], patient contact, verbalized understanding of instructions, educational materials, and care plan provided today and DECLINED offer to receive copy of patient instructions, educational materials, and care plan.   The patient / [name], patient contact, has been provided with contact information for the care management team and has been advised to call with any health related questions or concerns.    Lorna Few  MSW, LCSW [place] Licensed Clinical Social Worker Direct Dial:  [phone] Fax:  [phone] Website:  [email or web address]

## 2023-06-02 NOTE — Progress Notes (Deleted)
  Cardiology Office Note:  .   Date:  06/02/2023  ID:  [name], DOB [birth year], MRN [id number] PCP: [name], DO  [facility] Providers Cardiologist:  Truett Mainland, MD PCP: [name], DO  No chief complaint on file.     History of Present Illness: .    [name] is a 38 y.o. male with ***  There were no vitals filed for this visit.   ROS: *** ROS   Studies Reviewed: .        *** Independently interpreted ***/202***: Chol ***, TG ***, HDL ***, LDL *** HbA1C ***% Hb *** Cr *** ***  Risk Assessment/Calculations:   {Does this patient have ATRIAL FIBRILLATION?:[phone]}     Physical Exam:   Physical Exam   VISIT DIAGNOSES: No diagnosis found.   ASSESSMENT AND PLAN: .    [name] is a 38 y.o. male with ***  {Are you ordering a CV Procedure (e.g. stress test, cath, DCCV, TEE, etc)?   Press F2        :[id number]}    No orders of the defined types were placed in this encounter.    F/u in ***  Signed, Elder Negus, MD

## 2023-06-02 NOTE — Patient Outreach (Signed)
Care Coordination   Follow Up Visit Note   06/02/2023 Name: [name] MRN: [id number] DOB: 1985-06-18  [name] is a 38 y.o. year old male who sees Edward Alley, DO for primary care. I spoke with  [name] / [name], mother in law and contact for client, via  phone .   What matters to the patients health and wellness today?  Patient has stress related to managing his medical needs     Goals Addressed             This Visit's Progress    patient has stress related to managing his medical needs       Interventions:  Spoke via phone with [name], mother in law, contact for client, about client needs and current status Discussed program support for client with RN, LCSW, Pharmacist.  Client has been residing at home of one of his relatives.  [employer] is trying to help client locate an apartment where he can reside on his own [name] said client is going to dialysis regularly now. Client goes to dialysis treatments on Tuesdays, Thursdays and Saturdays. Client does drive himself to and from dialysis appointments Spoke of client medication procurement .   Discussed mood of client. Client is anxious about medical issues faced.  He does go to dialysis weekly as scheduled. He is trying to get on kidney transplant list with [facility] in [place], [place], [place]  Also client is divorced and so social support is somewhat limited. He does have support from his mother in law [name] , who is patient contact [name] said client is not always compliant with his diet plan.  She tries to speak with client about client care needs.  She said he has not been able to qualify to get on kidney transplant list at [facility].  She said client would have to have no hospitalizations in a 6 month period and be more stable medically to seek to qualify for kidney transplant list Discussed pain issues of client. Discussed sleeping issues of client [name] said client can  take in 32 ounces of fluid per day (recommended for client) [name] for phone call with LCSW. Encouraged client or [name] to call LCSW as needed for SW support for client at [phone].  [name] was appreciative of call from LCSW .            SDOH assessments and interventions completed:  Yes  SDOH Interventions Today    Flowsheet Row Most Recent Value  SDOH Interventions   Depression Interventions/Treatment  Counseling  Physical Activity Interventions Other (Comments)  [decreased energy. Goes to Dialysis 3 times weekly as scheduled]  Stress Interventions Other (Comment)  [stress in monitoring medical needs]        Care Coordination Interventions:  Yes, provided   Interventions Today    Flowsheet Row Most Recent Value  Chronic Disease   Chronic disease during today's visit Other  [spoke with [name], mother in law of client, about client needs]  General Interventions   General Interventions Discussed/Reviewed General Interventions Discussed, Community Resources  Education Interventions   Education Provided Provided Education  Provided Verbal Education On [employer]  Mental Health Interventions   Mental Health Discussed/Reviewed Coping Strategies  [client has anxiety in managing medical needs. client has housing issues, transport issues]  Nutrition Interventions   Nutrition Discussed/Reviewed Nutrition Discussed  Pharmacy Interventions   Pharmacy Dicussed/Reviewed Pharmacy Topics Discussed  Follow up plan: Client and [name] have LCSW name and phone number and can call LCSW as needed for SW support for client   Encounter Outcome:  Patient Visit Completed    [name]  MSW, LCSW [place] Licensed Clinical Social Worker Direct Dial:  [phone] Fax:  [phone] Website:  [email or web address]

## 2023-06-03 ENCOUNTER — Ambulatory Visit: Payer: Medicare Other | Admitting: Cardiology

## 2023-06-12 ENCOUNTER — Encounter (HOSPITAL_COMMUNITY): Payer: Self-pay | Admitting: *Deleted

## 2023-06-12 ENCOUNTER — Emergency Department (HOSPITAL_COMMUNITY)
Admission: EM | Admit: 2023-06-12 | Discharge: 2023-06-13 | Discharge status: Against Medical Advice | Payer: Medicare Other | Attending: Emergency Medicine | Admitting: Emergency Medicine

## 2023-06-12 ENCOUNTER — Other Ambulatory Visit: Payer: Self-pay

## 2023-06-12 DIAGNOSIS — Z5321 Procedure and treatment not carried out due to patient leaving prior to being seen by health care provider: Secondary | ICD-10-CM | POA: Diagnosis not present

## 2023-06-12 DIAGNOSIS — H5789 Other specified disorders of eye and adnexa: Secondary | ICD-10-CM | POA: Diagnosis present

## 2023-06-12 NOTE — ED Triage Notes (Signed)
The pt reports that he had some injections in his eyes for ??? Reason  he is hard to get an answer to questions asked

## 2023-06-12 NOTE — ED Triage Notes (Signed)
Irritated both eyes with redness  in his lt eye today

## 2023-06-13 NOTE — ED Notes (Signed)
Pt left stated he was tired of waiting!

## 2023-06-16 ENCOUNTER — Telehealth: Payer: Self-pay

## 2023-06-16 NOTE — Transitions of Care (Post Inpatient/ED Visit) (Signed)
 06/16/2023  Name: [name] MRN: [id number] DOB: [birth year]  Today's TOC FU Call Status: Today's TOC FU Call Status:: Successful TOC FU Call Completed TOC FU Call Complete Date: 06/16/23 Patient's Name and Date of Birth confirmed.  Transition Care Management Follow-up Telephone Call Date of Discharge: 06/13/23 Discharge Facility: [facility]) Type of Discharge: Emergency Department Reason for ED Visit: Other: (eye problem) How have you been since you were released from the hospital?: Same Any questions or concerns?: Yes  Items Reviewed: Did you receive and understand the discharge instructions provided?: No (left before seen) Medications obtained,verified, and reconciled?: Yes (Medications Reviewed) Any new allergies since your discharge?: No Dietary orders reviewed?: Yes Do you have support at home?: No  Medications Reviewed Today: Medications Reviewed Today     Reviewed by Emmitt Pan, LPN (Licensed Practical Nurse) on 06/16/23 at 1718  Med List Status: <None>   Medication Order Taking? Sig Documenting Provider Last Dose Status Informant  calcium  acetate (PHOSLO ) 667 MG capsule [id number] No Take 1 capsule (667 mg total) by mouth 3 (three) times daily with meals. Romelle Booty, MD Past Week Active Self, Pharmacy Records  carvedilol  (COREG ) 25 MG tablet [id number] No Take 1 tablet (25 mg total) by mouth 2 (two) times daily with a meal. Joshua Domino, DO Past Week Active Self, Pharmacy Records  divalproex  (DEPAKOTE  ER) 500 MG 24 hr tablet [id number] No Take 1 tablet (500 mg total) by mouth daily. Joshua Domino, DO Past Week Active Self, Pharmacy Records  famotidine  (PEPCID ) 20 MG tablet [id number] No Take 1 tablet (20 mg total) by mouth daily as needed for indigestion or heartburn. Joshua Domino, DO Past Week Active Self, Pharmacy Records  fluticasone  (FLONASE ) 50 MCG/ACT nasal spray [id number]  Place 1 spray into both nostrils daily. Cleotilde Lukes, DO  Active    gabapentin  (NEURONTIN ) 100 MG capsule [id number]  Please take 100 mg after dialysis on Tuesday, Thursday and Saturday Cleotilde Lukes, DO  Active   hydrALAZINE  (APRESOLINE ) 100 MG tablet [id number] No Take 1 tablet (100 mg total) by mouth 3 (three) times daily. Joshua Domino, DO Past Week Active Self, Pharmacy Records  Methoxy PEG-Epoetin Beta [facility]) [id number] No Inject 1 Dose into the skin every 14 (fourteen) days. [provider] Past Month Active Self, Pharmacy Records  minoxidil  (LONITEN ) 10 MG tablet [id number]  Take 1 tablet (10 mg total) by mouth 2 (two) times daily. Cleotilde Lukes, DO  Active   NIFEdipine  (ADALAT  CC) 60 MG 24 hr tablet [id number]  Take 1 tablet (60 mg total) by mouth daily. Cleotilde Lukes, DO  Active   pantoprazole  (PROTONIX ) 40 MG tablet [id number] No Take 1 tablet (40 mg total) by mouth daily. Joshua Domino, DO Past Week Active Self, Pharmacy Records           Med Note HASSEL, [facility]   Mon Apr 06, 2023  9:58 AM) Patient needs to pick up at pharmacy  sevelamer  carbonate (RENVELA ) 800 MG tablet [id number]  Take 2 tablets (1,600 mg total) by mouth 3 (three) times daily with meals. Cleotilde Lukes, DO  Active   Med List Note (Card, Greig LITTIE, CPhT 05/10/23 1218): Dialysis: Tuesday, Thursday, and Saturday.            Home Care and Equipment/Supplies: Were Home Health Services Ordered?: NA Any new equipment or medical supplies ordered?: NA  Functional Questionnaire: Do you need assistance with bathing/showering or dressing?: No Do you need assistance with meal preparation?: No Do you need assistance  with eating?: No Do you have difficulty maintaining continence: No Do you need assistance with getting out of bed/getting out of a chair/moving?: No Do you have difficulty managing or taking your medications?: No  Follow up appointments reviewed: PCP Follow-up appointment confirmed?: Yes Date of PCP follow-up appointment?: 06/17/23 Follow-up Provider:  [facility] Follow-up appointment confirmed?: Yes Date of Specialist follow-up appointment?: 06/18/23 Follow-Up Specialty Provider:: ophal Do you need transportation to your follow-up appointment?: No Do you understand care options if your condition(s) worsen?: Yes-patient verbalized understanding    SIGNATURE Julian Lemmings, LPN [facility] Nurse Health Advisor Direct Dial  [phone]

## 2023-06-17 ENCOUNTER — Ambulatory Visit (INDEPENDENT_AMBULATORY_CARE_PROVIDER_SITE_OTHER): Payer: Medicare Other | Admitting: Student

## 2023-06-17 ENCOUNTER — Encounter: Payer: Self-pay | Admitting: Student

## 2023-06-17 VITALS — BP 184/126 | HR 94 | Ht 68.0 in | Wt 183.6 lb

## 2023-06-17 DIAGNOSIS — H9190 Unspecified hearing loss, unspecified ear: Secondary | ICD-10-CM

## 2023-06-17 DIAGNOSIS — I10 Essential (primary) hypertension: Secondary | ICD-10-CM | POA: Diagnosis not present

## 2023-06-17 NOTE — Progress Notes (Signed)
    SUBJECTIVE:   CHIEF COMPLAINT / HPI:   Hearing loss Now with hearing difficulty despite wearing hearing aids. Had hearing aids cleaned and volume set to max last week but it did not help. Was noted to have nasopharynx cyst which could be blocking eustachian tubes on MRI last month. Needs referral to ENT.   HTN and ESRD Had HD yesterday  Took hydralazine  and coreg  this morning. Ran out of minoxidil  and nifidipine but is picking them up today Feels good today, no headache, CP, SOB.    PERTINENT  PMH / PSH: ESRD on HD, HTN, alport syndrome, hearing loss  OBJECTIVE:   BP (!) 184/126   Pulse 94   Ht 5' 8 (1.727 m)   Wt 183 lb 9.6 oz (83.3 kg)   SpO2 100%   BMI 27.92 kg/m    General: NAD, pleasant, able to participate in exam HEENT: White sclera clear conjunctiva of the right eye, conjunctival hemorrhage of the left eye (following with ophthalmology, saw yesterday), MMM, TMs erythematous with cone of light present and nonbulging Cardiac: RRR Respiratory: Breathing comfortably on room air Skin: warm and dry Neuro: alert, cranial nerves II through XII intact, sensation intact, finger-nose-finger test normal Psych: Normal affect and mood  ASSESSMENT/PLAN:   Hearing loss I suspect hearing loss despite wearing hearing aids is related to nasopharynx cyst blocking eustachian tubes. -Referral to ENT placed  Severe uncontrolled hypertension Uncontrolled in setting of ESRD and running out of medications.  Neuroexam reassuring and he is feeling well today. -Patient vies to pick up prescription of minoxidil  nifedipine  and take as soon as he can -ED precautions discussed. -Appointment made for follow-up on 2/12   Patient has several other things he would like to discuss including deconditioning after hospitalizations.  Appointment made for 2/12  Dr. Lauraine Molt, DO [facility] [facility]

## 2023-06-17 NOTE — Patient Instructions (Signed)
 It was great to see you! Thank you for allowing me to participate in your care!  I recommend that you always bring your medications to each appointment as this makes it easy to ensure you are on the correct medications and helps us  not miss when refills are needed.  Our plans for today:  - Please schedule a 40 minute long apt with in the next 2 weeks for follow up - Take your blood pressure medications after you pick them up today  - Is vision loss changes in your left eye, you develop a severe headache, have trouble speaking, dizziness, please go to the ED    Take care and seek immediate care sooner if you develop any concerns.   Dr. Lauraine Molt, DO [facility]

## 2023-06-18 NOTE — Assessment & Plan Note (Addendum)
 Uncontrolled in setting of ESRD and running out of medications.  Neuroexam reassuring and he is feeling well today. -Patient vies to pick up prescription of minoxidil  nifedipine  and take as soon as he can -ED precautions discussed. -Appointment made for follow-up on 2/12

## 2023-06-18 NOTE — Assessment & Plan Note (Signed)
 I suspect hearing loss despite wearing hearing aids is related to nasopharynx cyst blocking eustachian tubes. -Referral to ENT placed

## 2023-06-24 ENCOUNTER — Ambulatory Visit: Payer: Self-pay | Admitting: Student

## 2023-06-24 ENCOUNTER — Telehealth: Payer: Self-pay | Admitting: Student

## 2023-06-24 NOTE — Telephone Encounter (Signed)
Attempted to call patient to find out why he no showed for today's appointment and discuss no-show policy.  Patient did not answer.  Left voicemail asking for return call.

## 2023-06-24 NOTE — Progress Notes (Deleted)
   SUBJECTIVE:   CHIEF COMPLAINT / HPI:   ***  PERTINENT  PMH / PSH: ***  OBJECTIVE:   There were no vitals taken for this visit. ***  General: NAD, pleasant, able to participate in exam Cardiac: RRR, no murmurs. Respiratory: CTAB, normal effort, No wheezes, rales or rhonchi Abdomen: Bowel sounds present, nontender, nondistended, no hepatosplenomegaly. Extremities: no edema or cyanosis. Skin: warm and dry, no rashes noted Neuro: alert, no obvious focal deficits Psych: Normal affect and mood  ASSESSMENT/PLAN:   No problem-specific Assessment & Plan notes found for this encounter.     Dr. [name], DO [place] [facility]    {    This will disappear when note is signed, click to select method of visit    :1}

## 2023-07-02 ENCOUNTER — Encounter: Payer: Self-pay | Admitting: *Deleted

## 2023-07-02 ENCOUNTER — Ambulatory Visit: Payer: Medicare Other | Admitting: Student

## 2023-07-13 ENCOUNTER — Ambulatory Visit (INDEPENDENT_AMBULATORY_CARE_PROVIDER_SITE_OTHER): Payer: Medicare Other | Admitting: Student

## 2023-07-13 VITALS — BP 151/95 | HR 89 | Ht 68.0 in | Wt 181.4 lb

## 2023-07-13 DIAGNOSIS — K625 Hemorrhage of anus and rectum: Secondary | ICD-10-CM

## 2023-07-13 DIAGNOSIS — I10 Essential (primary) hypertension: Secondary | ICD-10-CM

## 2023-07-13 DIAGNOSIS — L299 Pruritus, unspecified: Secondary | ICD-10-CM | POA: Diagnosis not present

## 2023-07-13 MED ORDER — HYDROXYZINE HCL 10 MG PO TABS: 5.0000 mg | ORAL_TABLET | Freq: Two times a day (BID) | ORAL | 0 refills | Status: DC | PRN

## 2023-07-13 NOTE — Progress Notes (Unsigned)
    SUBJECTIVE:   CHIEF COMPLAINT / HPI:   Itchy skin Itching all over whole body for past 2 months. Feels like skin is very dry. Flaky scalp which also itches.  Has itched like this on and off for years.  Has been apply alcohol and vaseline  Blood in stool No blood in stool for 3 weeks. Has been getting blood transfusions with HD per pt because hgb was 7. Says it was most recently 8.  Was seen by GI during hospitalization this past summer with plans to follow-up outpatient however he never went.  Says he has tried to call but has not been able to reach anyone.   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  Dry flaky scalp.  See photos. Neuro: alert, no obvious focal deficits Psych: Normal affect and mood   ASSESSMENT/PLAN:   Rectal bleeding Internal and external hemorrhoids seen on previous flex sig.  No blood in stool for past 3 weeks per patient but has required recent blood transfusions with HD.  Really needs to establish care with outpatient GI. -Patient given contact information for GI, referral not needed as he was seen by them during hospitalization within the last year. -f/u appointment made for 07/21/2023 to ensure he establishes care with GI.  If needed, I can help him call  Itchy skin Could be chronic kidney disease associated pruritus.  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  No erythema or signs of inflammation. -Discussed using CeraVe moisturizing cream followed by Aquaphor or Vaseline twice daily -Rx Atarax as needed  Severe uncontrolled hypertension In setting of ESRD  on HD.  Uncontrolled but BP actually significantly lower than typical.  Patient states he has been getting HD more frequently and is at his dry weight which is likely contributing to lower than typical BP today.  -Continue current medications and follow with nephrology     Dr. [name], DO [facility]

## 2023-07-13 NOTE — Patient Instructions (Signed)
 It was great to see you! Thank you for allowing me to participate in your care!  I recommend that you always bring your medications to each appointment as this makes it easy to ensure you are on the correct medications and helps [place] not miss when refills are needed.  Our plans for today:  - Call to schedule apt with [facility]: Address: [address], [place], [place] [address] Phone: [phone] - Use generic form of cereve moisturizing cream all over body.  After it dries, then apply Vaseline.  Do this twice a day -I sent in a prescription for Atarax to use for itching.  Take half tablet twice daily as needed -Return ASAP for follow-up appointment   Take care and seek immediate care sooner if you develop any concerns.   Dr. [name], DO [facility]

## 2023-07-14 DIAGNOSIS — L299 Pruritus, unspecified: Secondary | ICD-10-CM | POA: Insufficient documentation

## 2023-07-14 DIAGNOSIS — L290 Pruritus ani: Secondary | ICD-10-CM | POA: Insufficient documentation

## 2023-07-14 NOTE — Assessment & Plan Note (Deleted)
 No blood in stool for past 3 weeks per patient but has required recent blood transfusions with HD.  Really needs to establish care with outpatient GI. -Patient given contact information for GI, referral not needed as he was seen by them during hospitalization within the last year.

## 2023-07-14 NOTE — Assessment & Plan Note (Addendum)
 In setting of ESRD on HD.  Uncontrolled but BP actually significantly lower than typical.  Patient states he has been getting HD more frequently and is at his dry weight which is likely contributing to lower than typical BP today.  -Continue current medications and follow with nephrology

## 2023-07-14 NOTE — Assessment & Plan Note (Addendum)
 Internal and external hemorrhoids seen on previous flex sig.  No blood in stool for past 3 weeks per patient but has required recent blood transfusions with HD.  Really needs to establish care with outpatient GI. -Patient given contact information for GI, referral not needed as he was seen by them during hospitalization within the last year. -f/u appointment made for 07/21/2023 to ensure he establishes care with GI.  If needed, I can help him call

## 2023-07-14 NOTE — Assessment & Plan Note (Signed)
 Could be chronic kidney disease associated pruritus.  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  No erythema or signs of inflammation. -Discussed using CeraVe moisturizing cream followed by Aquaphor or Vaseline twice daily -Rx Atarax as needed

## 2023-07-15 ENCOUNTER — Ambulatory Visit: Payer: Medicare Other | Admitting: Cardiology

## 2023-07-15 NOTE — Progress Notes (Deleted)
  Cardiology Office Note:  .   Date:  07/15/2023  ID:  [name], DOB 1985/09/18, MRN [id number] PCP: [name], DO  [facility] Providers Cardiologist:  Truett Mainland, MD PCP: [name], DO  No chief complaint on file.     History of Present Illness: .    [name] is a 38 y.o. male with hypertension  There were no vitals filed for this visit.   ROS: *** ROS   Studies Reviewed: .        *** Independently interpreted ***/202***: Chol ***, TG ***, HDL ***, LDL *** HbA1C ***% Hb *** Cr *** ***  Risk Assessment/Calculations:   {Does this patient have ATRIAL FIBRILLATION?:[phone]}     Physical Exam:   Physical Exam   VISIT DIAGNOSES: No diagnosis found.   ASSESSMENT AND PLAN: .    [name] is a 38 y.o. male with ***  {Are you ordering a CV Procedure (e.g. stress test, cath, DCCV, TEE, etc)?   Press F2        :[id number]}    No orders of the defined types were placed in this encounter.    F/u in ***  Signed, Elder Negus, MD

## 2023-07-21 ENCOUNTER — Encounter: Payer: Self-pay | Admitting: Student

## 2023-07-21 ENCOUNTER — Ambulatory Visit (INDEPENDENT_AMBULATORY_CARE_PROVIDER_SITE_OTHER): Payer: Self-pay | Admitting: Student

## 2023-07-21 VITALS — BP 122/56 | HR 101 | Ht 68.0 in | Wt 183.8 lb

## 2023-07-21 DIAGNOSIS — D179 Benign lipomatous neoplasm, unspecified: Secondary | ICD-10-CM | POA: Diagnosis present

## 2023-07-21 DIAGNOSIS — L299 Pruritus, unspecified: Secondary | ICD-10-CM

## 2023-07-21 DIAGNOSIS — L700 Acne vulgaris: Secondary | ICD-10-CM

## 2023-07-21 MED ORDER — ADAPALENE 0.1 % EX GEL: Freq: Every day | CUTANEOUS | 0 refills | Status: DC

## 2023-07-21 MED ORDER — HYDROXYZINE HCL 10 MG PO TABS: 5.0000 mg | ORAL_TABLET | Freq: Two times a day (BID) | ORAL | 0 refills | Status: DC | PRN

## 2023-07-21 NOTE — Progress Notes (Unsigned)
    SUBJECTIVE:   CHIEF COMPLAINT / HPI:   Lipoma Located on R shoulder, noted previously several times.  He would like it removed  Itching Seen for whole body itching at last visit, thought to be related to chronic kidney disease associated pruritus. still itching - has been applying Vaseline. Has not yet bought moisturizing cream or picked up attarax from pharmacy. Aside from itching, he is feeling well today  Acne Notes worsening acne on his face since he has been applying Vaseline regularly.    PERTINENT  PMH / PSH: HTN, ESRD on HD  OBJECTIVE:   BP (!) 122/56   Pulse (!) 101   Ht 5\' 8"  (1.727 m)   Wt 183 lb 12.8 oz (83.4 kg)   SpO2 100%   BMI 27.95 kg/m    General: NAD, pleasant, able to participate in exam Cardiac: RRR Respiratory: CTAB, normal effort, No wheezes, rales or rhonchi Extremities: no edema or cyanosis. Skin: Diffusely dry and flaky with scattered excoriations on extremities and back without drainage surrounding erythema or warmth Comedonal acne on bilateral cheeks.  See photo Large soft mobile mass located on right posterior shoulder - see photo from 04/20/23 Neuro: alert, no obvious focal deficits Psych: Normal affect and mood   ASSESSMENT/PLAN:   Itchy skin Most likely related to chronic kidney disease pruritus. Advised patient to purchase over-the-counter moisturizing cream as previously discussed, apply BID followed by Vaseline. Rx Atarax to use prn for itching. Return if these things do not help  Acne vulgaris Discussed treating acne could dry out face and worsen itching which patient understands -Use gentle cleanser on face at bedtime followed by adapalene gel which has been sent to pharmacy  Lipoma Referral placed to general surgery to evaluate for removal     Dr. [name], DO [facility]

## 2023-07-21 NOTE — Patient Instructions (Addendum)
 It was great to see you! Thank you for allowing me to participate in your care!  I recommend that you always bring your medications to each appointment as this makes it easy to ensure you are on the correct medications and helps [place] not miss when refills are needed.  Our plans for today:  -Cleanse the skin with a gentle cleanser such as Cetaphil gentle skin cleanser -Then apply a thin layer of adapalene gel once daily -I still recommend purchasing the generic form of CeraVe a moisturizing cream and applying over whole body twice daily.  Let this dry then apply a layer of Vaseline.  Try taking Atarax for the itching -You can schedule an appointment in our dermatology clinic if desired for further evaluation -I referred you to a general surgeon to evaluate your lipoma.  They will call you to schedule  Take care and seek immediate care sooner if you develop any concerns.   Dr. [name], DO [facility]

## 2023-07-22 ENCOUNTER — Encounter: Payer: Self-pay | Admitting: Student

## 2023-07-22 DIAGNOSIS — D179 Benign lipomatous neoplasm, unspecified: Secondary | ICD-10-CM | POA: Insufficient documentation

## 2023-07-22 DIAGNOSIS — L700 Acne vulgaris: Secondary | ICD-10-CM | POA: Insufficient documentation

## 2023-07-22 NOTE — Assessment & Plan Note (Signed)
 Referral placed to general surgery to evaluate for removal

## 2023-07-22 NOTE — Assessment & Plan Note (Signed)
 Most likely related to chronic kidney disease pruritus. Advised patient to purchase over-the-counter moisturizing cream as previously discussed, apply BID followed by Vaseline. Rx Atarax to use prn for itching. Return if these things do not help

## 2023-07-22 NOTE — Assessment & Plan Note (Signed)
 Discussed treating acne could dry out face and worsen itching which patient understands -Use gentle cleanser on face at bedtime followed by adapalene gel which has been sent to pharmacy

## 2023-07-24 ENCOUNTER — Encounter: Payer: Self-pay | Admitting: Student

## 2023-07-29 ENCOUNTER — Ambulatory Visit: Attending: Cardiology | Admitting: Cardiology

## 2023-07-29 ENCOUNTER — Encounter: Payer: Self-pay | Admitting: Cardiology

## 2023-07-29 VITALS — BP 170/80 | HR 89 | Ht 68.0 in | Wt 187.4 lb

## 2023-07-29 DIAGNOSIS — I10 Essential (primary) hypertension: Secondary | ICD-10-CM | POA: Diagnosis present

## 2023-07-29 DIAGNOSIS — I3139 Other pericardial effusion (noninflammatory): Secondary | ICD-10-CM

## 2023-07-29 MED ORDER — MINOXIDIL 10 MG PO TABS: 10.0000 mg | ORAL_TABLET | Freq: Two times a day (BID) | ORAL | 1 refills | Status: DC

## 2023-07-29 NOTE — Patient Instructions (Signed)
 Medication Instructions:  Refilled Minoxidil   *If you need a refill on your cardiac medications before your next appointment, please call your pharmacy*   Testing/Procedures: Limited Echo   Your physician has requested that you have an echocardiogram. Echocardiography is a painless test that uses sound waves to create images of your heart. It provides your doctor with information about the size and shape of your heart and how well your heart's chambers and valves are working. This procedure takes approximately one hour. There are no restrictions for this procedure. Please do NOT wear cologne, perfume, aftershave, or lotions (deodorant is allowed). Please arrive 15 minutes prior to your appointment time.  Please note: We ask at that you not bring children with you during ultrasound (echo/ vascular) testing. Due to room size and safety concerns, children are not allowed in the ultrasound rooms during exams. Our front office staff cannot provide observation of children in our lobby area while testing is being conducted. An adult accompanying a patient to their appointment will only be allowed in the ultrasound room at the discretion of the ultrasound technician under special circumstances. We apologize for any inconvenience.    Follow-Up: At [facility], you and your health needs are our priority.  As part of our continuing mission to provide you with exceptional heart care, we have created designated Provider Care Teams.  These Care Teams include your primary Cardiologist (physician) and Advanced Practice Providers (APPs -  Physician Assistants and Nurse Practitioners) who all work together to provide you with the care you need, when you need it.   Your next appointment:   As needed   Provider:   Elder Negus, MD     Other Instructions   1st Floor: - Lobby - Registration  - Pharmacy  - Lab - Cafe  2nd Floor: - PV Lab - Diagnostic Testing (echo, CT, nuclear  med)  3rd Floor: - Vacant  4th Floor: - TCTS (cardiothoracic surgery) - AFib Clinic - Structural Heart Clinic - Vascular Surgery  - Vascular Ultrasound  5th Floor: - HeartCare Cardiology (general and EP) - Clinical Pharmacy for coumadin, hypertension, lipid, weight-loss medications, and med management appointments    Valet parking services will be available as well.

## 2023-07-29 NOTE — Progress Notes (Signed)
 Cardiology Office Note:  .   Date:  07/29/2023  ID:  [name], DOB 1985/10/14, MRN [id number] PCP: [name], DO  [facility] Providers Cardiologist:  Truett Mainland, MD PCP: [name], DO  Chief Complaint  Patient presents with   Hypertension      History of Present Illness: .    [name] is a 38 y.o. male with hypertension, pericardial effusion, ESRD on hemodialysis, bipolar disorder, depression  Patient denies any chest pain.  He is getting his dialysis on T, Th, Sa.  Blood pressure is 140s with this.  He denies any exertional chest pain.  He has been off heartburn after eating certain foods, has been echocardiogram normal limits.  He has an appointment to see gastroenterology next month.  He has been off of it for last couple of days.   Vitals:   07/29/23 1333  BP: (!) 170/80  Pulse: 89  SpO2: 96%     ROS:  Review of Systems  Cardiovascular:  Negative for chest pain, dyspnea on exertion, leg swelling, palpitations and syncope.  Gastrointestinal:  Positive for diarrhea and heartburn.     Studies Reviewed: Marland Kitchen         Independently interpreted 05/2023: Hb 10.4 Cr 14.1 Na 129, K 4.3 BNP 1341  Echocardiogram 04/2023: 1. Left ventricular ejection fraction, by estimation, is 55 to 60%. The  left ventricle has normal function. The left ventricle has no regional  wall motion abnormalities. There is severe concentric left ventricular  hypertrophy.   2. Right ventricular systolic function is normal. The right ventricular  size is normal. There is mildly elevated pulmonary artery systolic  pressure.   3. Moderate pericardial effusion. The pericardial effusion is  circumferential.   4. The mitral valve is normal in structure. Mild mitral valve  regurgitation. No evidence of mitral stenosis.   5. The aortic valve is normal in structure. Aortic valve regurgitation is  not visualized. No aortic stenosis is present.   6. Mildly dilated  pulmonary artery.   7. The inferior vena cava is normal in size with greater than 50%  respiratory variability, suggesting right atrial pressure of 3 mmHg.      Physical Exam:   Physical Exam Vitals and nursing note reviewed.  Constitutional:      General: He is not in acute distress. Neck:     Vascular: No JVD.  Cardiovascular:     Rate and Rhythm: Normal rate and regular rhythm.     Heart sounds: Normal heart sounds. No murmur heard. Pulmonary:     Effort: Pulmonary effort is normal.     Breath sounds: Normal breath sounds. No wheezing or rales.  Musculoskeletal:     Right lower leg: No edema.     Left lower leg: No edema.     VISIT DIAGNOSES:   ICD-10-CM   1. Pericardial effusion  I31.39 ECHOCARDIOGRAM LIMITED    2. Primary hypertension  I10        ASSESSMENT AND PLAN: .    [name] is a 38 y.o. male with hypertension, pericardial effusion, ESRD on hemodialysis, bipolar disorder, depression  Hypertension: Uncontrolled.  He has been out of his minoxidil 10 mg twice daily.   I refilled the same. Continue rest of the antihypertensive therapy as is. I will defer future refill of antihypertensive medications to his PCP.  Pericardial effusion: Likely related to ESRD.  Will check limited echocardiogram. I do nothing pericardial effusion will require pericardiocentesis in  absence of any clinical signs of compression.  With no ongoing acute cardiac issues, I will see him as needed.  I will follow-up with the results of upcoming echocardiogram with the patient.     Meds ordered this encounter  Medications   minoxidil (LONITEN) 10 MG tablet    Sig: Take 1 tablet (10 mg total) by mouth 2 (two) times daily.    Dispense:  180 tablet    Refill:  1     F/u as needed  Signed, Elder Negus, MD

## 2023-07-31 ENCOUNTER — Encounter (INDEPENDENT_AMBULATORY_CARE_PROVIDER_SITE_OTHER): Payer: Self-pay

## 2023-07-31 ENCOUNTER — Telehealth (INDEPENDENT_AMBULATORY_CARE_PROVIDER_SITE_OTHER): Payer: Self-pay | Admitting: Audiology

## 2023-07-31 NOTE — Telephone Encounter (Signed)
 Reminder Call:  Date: 08/03/2023 Status: Sch  Time: 9:00 AM Left voicemail and/or MyChart message with time and location- [address] [place], [place] [address]       MyChart message: A Message from [facility] - Appointment reminder for 08/03/23  at 9:00 & 9:30 AM. The service location for BOTH appointment is [address] [place], [place] [address]. Please call [phone] if you need to reschedule.

## 2023-08-03 ENCOUNTER — Ambulatory Visit (INDEPENDENT_AMBULATORY_CARE_PROVIDER_SITE_OTHER): Admitting: Audiology

## 2023-08-03 ENCOUNTER — Ambulatory Visit (INDEPENDENT_AMBULATORY_CARE_PROVIDER_SITE_OTHER)

## 2023-08-03 ENCOUNTER — Encounter (INDEPENDENT_AMBULATORY_CARE_PROVIDER_SITE_OTHER): Admitting: Physician Assistant

## 2023-08-03 DIAGNOSIS — H903 Sensorineural hearing loss, bilateral: Secondary | ICD-10-CM | POA: Diagnosis not present

## 2023-08-03 DIAGNOSIS — H9190 Unspecified hearing loss, unspecified ear: Secondary | ICD-10-CM

## 2023-08-03 DIAGNOSIS — J392 Other diseases of pharynx: Secondary | ICD-10-CM

## 2023-08-03 NOTE — Progress Notes (Signed)
 [address] [place], [place] [address] [phone]  Audiological Evaluation    Name: [name]     DOB:   17-Dec-1985      MRN:   [id number]                                                                                     Service Date: 08/03/2023     Accompanied by: unaccompanied   Patient comes today after Eyvonne Mechanic, PA-C sent a referral for a hearing evaluation due to concerns with hearing loss.   Symptoms Yes Details  Hearing loss  [x]  Patient reports that he has known hearing loss in both ears. Recently (while in the emergency department -maybe on 05-14-2023)  reports that his hearing suddenly worsened ( along with vision) when he woke up   Previous audiograms have been where he "hearing aid doctor" is and there was one report on file from 01-04-2020 from [facility].  Tinnitus  []    Ear pain/ infections/pressure  [x]  Left ear pain  Balance problems  []    Noise exposure history  []    Previous ear surgeries  []    Family history of hearing loss  [x]  Per previous chart notes - father  Amplification  [x]  Has a set of receiver in the ear hearing aids. Nor using the left side today due to pain. He was maybe fit at [facility].  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 Left ear:  Clear external ear canals and notable landmarks visualized on the tympanic membrane.  Tympanometry: Right ear: Type A- Normal external ear canal volume with normal middle ear pressure and tympanic membrane compliance Left ear: Type A- Normal external ear canal volume with normal middle ear pressure and tympanic membrane compliance  Distortion Product Otoacoustic Emissions: Not available.  Pure tone Audiometry: Both ears- Severe to profound presumably sensorineural hearing loss from 125 Hz - 8000 Hz. Obtained bone conduction  responses for some tones show an air-bone gap that could not be masked due to equipment limits given air-conduction thresholds. Some of those bone conduction responses may be vibro-tactile.  Speech Audiometry: Right ear- Speech Reception Threshold (SRT) was obtained at 90 dBHL. Left ear-Speech Reception Threshold (SRT) was obtained at 100 dBHL.   Word Recognition Score Tested using NU-6 (MLV) Right ear: 48% was obtained at a presentation level of 110 dBHL with contralateral masking which is deemed as  poor. Left ear: 28% was obtained at a presentation level of 110 dBHL with contralateral masking which is deemed as  excellent.   The hearing test results were completed under headphones and results are deemed to be of fair reliability. Test technique:  conventional    Impression: Today's speech reception threshold seems to be worse than the one obtained in 2021 at [facility].  Recommendations: Follow up with ENT as scheduled for today. Return for a hearing evaluation if concerns with hearing changes arise or per MD recommendation. Recommend hearing aid check given hearing changes. Of note,patient's hearing may  have worsened to a point that  a more powerful set of hearing aids with ear molds should be considered and if that does not provide enough benefit, then  may be considered for cochlear implant evaluation.    Kaleeya Hancock MARIE LEROUX-MARTINEZ, AUD

## 2023-08-03 NOTE — Progress Notes (Unsigned)
 Dear Dr. Perley Jain, Here is my assessment for our mutual patient, [name]. Thank you for allowing me the opportunity to care for your patient. Please do not hesitate to contact me should you have any other questions. Sincerely, Burna Forts PA-C  Otolaryngology Clinic Note Referring provider: Dr. McDiarmid HPI:  [name] is a 38 y.o. male kindly referred by Dr. McDiarmid   The patient is a 38 YOM presenting today for evaluation of hearing loss. 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  He is currently on dialysis.  He had initial work up and was wearing hearing aids until the age of 38. After that time he stopped wearing hearing aids until follow up evaluation with Dr. Annalee Genta in 2019. He had audiogram at that time that showed sever to profound bilateral symmetric hearing loss at approximately 70-85 dB. Speech discrimination 48% bilaterally, 60% combined. No evidence of conductive loss and normal tympanograms.  The patient notes since that time he has not been followed closely by ENT.  He notes [facility] has [facility] last several years who have managed his hearing aids.  He notes that he has had several recent hospitalizations and is a bit confused on the timeline of all the events.  He notes that during one of his last hospitalizations he woke up and had decreased hearing bilateral.  He notes his family felt like his hearing had been getting worse but he notes an abrupt change during the hospitalization.  He was hospitalized for loss of consciousness on 03/27/2023, this was likely secondary to sepsis and infection of his AV fistula.  He was again hospitalized on 05/08/2023 for hypertensive emergency.  During that hospitalization he had an MRI of his head for headache and visual changes on 05/13/2023.  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  In addition to the decreased hearing he notes that both of his ears have been hurting lately, he notes that he has drainage from his ears that is clear.  He notes the pain he has in his ears is improved when he takes out his hearing aids.  In addition to the above complaints he also notes that he has had several years of trouble swallowing, he notes he is followed by GI for this and has had balloon dilation with no significant improvement in his symptoms.  He denies any changes since that time.   H&N Surgery: wisdom tooth extraction  Personal or FHx of bleeding dz or anesthesia difficulty: no   Tobacco: yes, black and milds  Independent Review of Additional Tests or Records:  MRI brain without contrast on 05/13/2023 showing pharyngeal cyst, discharge summary on 05/14/2023, discharge summary on 04/04/2023  Audiological evaluation  08/03/2023  Tympanometry: Right ear: Type A- Normal external ear canal volume with normal middle ear pressure and tympanic membrane compliance Left ear: Type A- Normal external ear canal volume with normal middle ear pressure and tympanic membrane compliance   Distortion Product Otoacoustic Emissions: Not available.   Pure tone Audiometry: Both ears- Severe to profound presumably sensorineural hearing loss from 125 Hz - 8000 Hz. Obtained bone conduction responses for some tones show an air-bone gap that could not be masked due to equipment limits given air-conduction thresholds. Some of those bone conduction responses may be vibro-tactile.   Speech Audiometry: Right ear- Speech Reception Threshold (SRT) was  obtained at 90 dBHL. Left ear-Speech Reception Threshold (SRT) was obtained at 100 dBHL.   Word Recognition Score Tested using NU-6 (MLV) Right ear: 48% was obtained at a presentation level of 110 dBHL with contralateral masking which is deemed as  poor. Left ear: 28% was obtained at a presentation level of 110 dBHL with contralateral  masking which is deemed as  excellent.   The hearing test results were completed under headphones and results are deemed to be of fair reliability. Test technique:  conventional     Impression: Today's speech reception threshold seems to be worse than the one obtained in 2021 at [facility].     PMH/Meds/All/SocHx/FamHx/ROS:   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   Heart murmur    Hypertension    Low blood sugar    Marijuana abuse    Noncompliance    Pericardial effusion 03/28/2023   Tobacco abuse      Past Surgical History:  Procedure Laterality Date   A/V FISTULAGRAM Right 10/27/2022   Procedure: A/V Fistulagram;  Surgeon: Chuck Hint, MD;  Location: [facility] INVASIVE CV LAB;  Service: Cardiovascular;  Laterality: Right;   APPENDECTOMY     AV FISTULA PLACEMENT Left 03/03/2019   Procedure: ARTERIOVENOUS (AV) FISTULA CREATION LEFT ARM;  Surgeon: Maeola Harman, MD;  Location: [facility] OR;  Service: Vascular;  Laterality: Left;   BASCILIC VEIN TRANSPOSITION Right 04/12/2019   Procedure: RIGHT UPPER EXTREMITY BASCILIC VEIN TRANSPOSITION FIRST STAGE FISTULA;  Surgeon: Chuck Hint, MD;  Location: [facility] OR;  Service: Vascular;  Laterality: Right;   BIOPSY  10/14/2021   Procedure: BIOPSY;  Surgeon: Sherrilyn Rist, MD;  Location: [facility] ENDOSCOPY;  Service: Gastroenterology;;   BIOPSY  06/06/2022   Procedure: BIOPSY;  Surgeon: Napoleon Form, MD;  Location: [facility] ENDOSCOPY;  Service: Gastroenterology;;   BIOPSY  11/12/2022   Procedure: BIOPSY;  Surgeon: Lemar Lofty., MD;  Location: [facility] ENDOSCOPY;  Service: Gastroenterology;;   BIOPSY   12/05/2022   Procedure: BIOPSY;  Surgeon: [name], MD;  Location: [facility] ENDOSCOPY;  Service: Gastroenterology;;   COLONOSCOPY WITH PROPOFOL N/A 10/14/2021   Procedure: COLONOSCOPY WITH PROPOFOL;  Surgeon: Sherrilyn Rist, MD;  Location: [facility] ENDOSCOPY;  Service: Gastroenterology;  Laterality: N/A;   DIALYSIS/PERMA CATHETER INSERTION N/A 04/03/2023   Procedure: DIALYSIS/PERMA CATHETER INSERTION;  Surgeon: Leonie Douglas, MD;  Location: [facility] INVASIVE CV LAB;  Service: Cardiovascular;  Laterality: N/A;   ESOPHAGOGASTRODUODENOSCOPY N/A 11/12/2022   Procedure: ESOPHAGOGASTRODUODENOSCOPY (EGD);  Surgeon: Lemar Lofty., MD;  Location: [facility] ENDOSCOPY;  Service: Gastroenterology;  Laterality: N/A;   ESOPHAGOGASTRODUODENOSCOPY N/A 12/05/2022   Procedure: ESOPHAGOGASTRODUODENOSCOPY (EGD);  Surgeon: [name], MD;  Location: [facility] ENDOSCOPY;  Service: Gastroenterology;  Laterality: N/A;   ESOPHAGOGASTRODUODENOSCOPY (EGD) WITH PROPOFOL N/A 06/06/2022   Procedure: ESOPHAGOGASTRODUODENOSCOPY (EGD) WITH PROPOFOL;  Surgeon: Napoleon Form, MD;  Location: [facility] ENDOSCOPY;  Service: Gastroenterology;  Laterality: N/A;   FISTULA SUPERFICIALIZATION Right 11/07/2022   Procedure: PLICATION OF LARGE ANEURYSMS OF RIGHT ARTERIOVENOUS FISTULA WITH INSERTION OF 7mm INTERPOSITIONAL  GORTEX GRAFT;  Surgeon: Chuck Hint, MD;  Location: [facility] OR;  Service: Vascular;  Laterality: Right;   FLEXIBLE SIGMOIDOSCOPY N/A 04/14/2022   Procedure: FLEXIBLE SIGMOIDOSCOPY;  Surgeon: Napoleon Form, MD;  Location: [facility] ENDOSCOPY;  Service: Gastroenterology;  Laterality: N/A;   INSERTION OF DIALYSIS CATHETER N/A 11/07/2022   Procedure: INSERTION OF RIGHT INTERNAL JUGULAR TUNNELED DIALYSIS CATHETER;  Surgeon: Chuck Hint, MD;  Location: [facility] OR;  Service: Vascular;  Laterality: N/A;   LIGATION OF ARTERIOVENOUS  FISTULA Left 03/06/2019   Procedure: LIGATION OF ARTERIOVENOUS  FISTULA;  Surgeon: Cephus Shelling, MD;  Location: [facility] OR;  Service: Vascular;  Laterality: Left;   PERIPHERAL VASCULAR BALLOON ANGIOPLASTY  10/27/2022   Procedure: PERIPHERAL VASCULAR BALLOON ANGIOPLASTY;  Surgeon: Chuck Hint, MD;  Location: [facility] INVASIVE CV LAB;  Service: Cardiovascular;;  rt upper arm fistula   SAVORY DILATION N/A 11/12/2022   Procedure: SAVORY DILATION;  Surgeon: Meridee Score Netty Starring., MD;  Location: [facility] ENDOSCOPY;  Service: Gastroenterology;  Laterality: N/A;   spinal tap     SPINE SURGERY     related to a spinal infection, unsure of surgery or infection source   TRANSESOPHAGEAL ECHOCARDIOGRAM (CATH LAB) N/A 04/02/2023   Procedure: TRANSESOPHAGEAL ECHOCARDIOGRAM;  Surgeon: Jake Bathe, MD;  Location: [facility] INVASIVE CV LAB;  Service: Cardiovascular;  Laterality: N/A;   WISDOM TOOTH EXTRACTION      Family History  Problem Relation Age of Onset   Hypertension Mother    Kidney failure Mother    Diabetes Mother    Hearing loss Father    Hypertension Sister    Heart disease Maternal Grandmother    Stroke Maternal Grandfather    Asthma Son      Social Connections: Unknown (09/20/2021)   Received from [facility]   Social Network    Social Network: Not on file      Current Outpatient Medications:    adapalene (ADAPALENE TREATMENT) 0.1 % gel, Apply topically at bedtime., Disp: 45 g, Rfl: 0   calcium acetate (PHOSLO) 667 MG capsule, Take 1 capsule (667 mg total) by mouth 3 (three) times daily with meals., Disp: 100 capsule, Rfl: 0   carvedilol (COREG) 25 MG tablet, Take 1 tablet (25 mg total) by mouth 2 (two) times daily with a meal., Disp: 60 tablet, Rfl: 0   divalproex (DEPAKOTE ER) 500 MG 24 hr tablet, Take 1 tablet (500 mg total) by mouth daily., Disp: 30 tablet, Rfl: 0   famotidine (PEPCID) 20 MG tablet, Take 1 tablet (20 mg total) by mouth daily as needed for indigestion or heartburn., Disp: 30 tablet, Rfl: 0   fluticasone (FLONASE) 50 MCG/ACT nasal spray,  Place 1 spray into both nostrils daily., Disp: 16 g, Rfl: 2   gabapentin (NEURONTIN) 100 MG capsule, Please take 100 mg after dialysis on Tuesday, Thursday and Saturday, Disp: 30 capsule, Rfl: 0   hydrALAZINE (APRESOLINE) 100 MG tablet, Take 1 tablet (100 mg total) by mouth 3 (three) times daily., Disp: 90 tablet, Rfl: 0   hydrOXYzine (ATARAX) 10 MG tablet, Take 0.5 tablets (5 mg total) by mouth 2 (two) times daily as needed for itching., Disp: 30 tablet, Rfl: 0   LOPERAMIDE HCL PO, Take by mouth., Disp: , Rfl:    Methoxy PEG-Epoetin Beta (MIRCERA IJ), Inject 1 Dose into the skin every 14 (fourteen) days., Disp: , Rfl:    Methoxy PEG-Epoetin Beta (MIRCERA IJ), Mircera, Disp: , Rfl:    minoxidil (LONITEN) 10  MG tablet, Take 1 tablet (10 mg total) by mouth 2 (two) times daily., Disp: 180 tablet, Rfl: 1   NIFEdipine (ADALAT CC) 60 MG 24 hr tablet, Take 1 tablet (60 mg total) by mouth daily., Disp: 30 tablet, Rfl: 0   pantoprazole (PROTONIX) 40 MG tablet, Take 1 tablet (40 mg total) by mouth daily., Disp: 30 tablet, Rfl: 0   sevelamer carbonate (RENVELA) 800 MG tablet, Take 2 tablets (1,600 mg total) by mouth 3 (three) times daily with meals., Disp: 180 tablet, Rfl: 0   Physical Exam:   There were no vitals taken for this visit.  Pertinent Findings  CN II-XII intact  Bilateral EAC clear and TM intact with well pneumatized middle ear spaces, minor injection of the bilateral tympanic membranes Anterior rhinoscopy: Septum midline; bilateral inferior turbinates with no hypertrophy No lesions of oral cavity/oropharynx; dentition wnl No obviously palpable neck masses/lymphadenopathy/thyromegaly No respiratory distress or stridor  Seprately Identifiable Procedures:   Procedure Note Pre-procedure diagnosis:  Tornwaldt cyst Post-procedure diagnosis: Same Procedure: Transnasal Fiberoptic Laryngoscopy, CPT 31575 - Mod 25 Indication: see above Complications: None apparent EBL: 0 mL   The procedure  was undertaken to further evaluate the patient's complaint of Tornwaldt cyst, with mirror exam inadequate for appropriate examination due to gag reflex and poor patient tolerance   Procedure:  Patient was identified as correct patient. Verbal consent was obtained. The nose was sprayed with oxymetazoline and 4% lidocaine. The The flexible laryngoscope was passed through the nose to view the nasal cavity, pharynx (oropharynx, hypopharynx) and larynx.  The larynx was examined at rest and during multiple phonatory tasks. Documentation was obtained and reviewed with patient. The scope was removed. The patient tolerated the procedure well.   Findings: The nasal cavity and nasopharynx did not reveal any masses or lesions, mucosa appeared to be without obvious lesions. The tongue base, pharyngeal walls, piriform sinuses, vallecula, epiglottis and postcricoid region are normal in appearance with exception of large cyst along the posterior nasopharynx abutting the fossa of Rosenmuller, this did not occlude the eustachian tuber orifice on either side  . The visualized portion of the subglottis and proximal trachea is widely patent. The vocal folds are mobile bilaterally.. There are no lesions on the free edge of the vocal folds nor elsewhere in the larynx worrisome for malignancy.      Impression & Plans:  [name] is a 38 y.o. male with the following   Hearing loss-  38 year old gentleman presenting today with worsening hearing loss.  The patient does have a known diagnosis of hearing loss.  He reports that this has significantly worsened.  Based on audiological evaluation today it is worse compared to previous in 2021.  He does note that he has been seen by audiology over the last several years, he will obtain these records and fax them to our office for my review.  Once these are available I will provide further recommendations moving forward.  There was some question as to whether the nasopharyngeal  cyst was occluding the eustachian tube causing bilateral effusions.  His exam today showed no effusions, I did obtain images that showed no significant compression on the eustachian tube or orifice.  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  This does not appear to be causing any significant issues at this time and is likely an incidental finding.  Difficulty swallowing-  The patient reports some difficulty swallowing, he is currently under the care  of gastroenterology for this.  He has no pooling of secretions or lesions on his nasal endoscopy today.  Continue outpatient follow-up with gastroenterology.   - f/u ***   Thank you for allowing me the opportunity to care for your patient. Please do not hesitate to contact me should you have any other questions.  Sincerely, Burna Forts PA-C  [facility] Phone: [phone] Fax: [phone]  08/03/2023, 11:11 AM

## 2023-08-04 ENCOUNTER — Encounter (INDEPENDENT_AMBULATORY_CARE_PROVIDER_SITE_OTHER): Payer: Self-pay | Admitting: Physician Assistant

## 2023-08-13 NOTE — Telephone Encounter (Signed)
 Called patient at both listed numbers.   Patient did not answer. Spoke with mother in law, who will provide message to call our office.   Veronda Prude, RN

## 2023-08-19 ENCOUNTER — Ambulatory Visit

## 2023-08-19 ENCOUNTER — Telehealth (INDEPENDENT_AMBULATORY_CARE_PROVIDER_SITE_OTHER): Payer: Self-pay | Admitting: Physician Assistant

## 2023-08-19 NOTE — Telephone Encounter (Signed)
 I reviewed his case with Dr. Allena Katz. He would recommend MRI neck to further assess the pharyngeal cyst as well as CT temporal bones for the worsening hearing loss with concern for conductive loss. I spoke with [name] on the phone. He will call to schedule the imaging and I will reach out to him once the results are available for review. He will likely need biopsy. He may also be a candidate for cochlear implant.

## 2023-08-27 ENCOUNTER — Emergency Department (HOSPITAL_COMMUNITY)

## 2023-08-27 ENCOUNTER — Emergency Department (HOSPITAL_COMMUNITY)
Admission: EM | Admit: 2023-08-27 | Discharge: 2023-08-28 | Disposition: A | Discharge status: Home / Self Care | Attending: Emergency Medicine | Admitting: Emergency Medicine

## 2023-08-27 ENCOUNTER — Ambulatory Visit (INDEPENDENT_AMBULATORY_CARE_PROVIDER_SITE_OTHER): Admitting: Student

## 2023-08-27 ENCOUNTER — Other Ambulatory Visit: Payer: Self-pay

## 2023-08-27 VITALS — BP 165/92 | HR 90 | Ht 68.0 in | Wt 177.0 lb

## 2023-08-27 DIAGNOSIS — E875 Hyperkalemia: Secondary | ICD-10-CM | POA: Insufficient documentation

## 2023-08-27 DIAGNOSIS — R109 Unspecified abdominal pain: Secondary | ICD-10-CM | POA: Diagnosis not present

## 2023-08-27 DIAGNOSIS — N186 End stage renal disease: Secondary | ICD-10-CM | POA: Insufficient documentation

## 2023-08-27 DIAGNOSIS — R197 Diarrhea, unspecified: Secondary | ICD-10-CM | POA: Insufficient documentation

## 2023-08-27 DIAGNOSIS — R0602 Shortness of breath: Secondary | ICD-10-CM | POA: Insufficient documentation

## 2023-08-27 DIAGNOSIS — F319 Bipolar disorder, unspecified: Secondary | ICD-10-CM | POA: Insufficient documentation

## 2023-08-27 DIAGNOSIS — R29818 Other symptoms and signs involving the nervous system: Secondary | ICD-10-CM | POA: Diagnosis not present

## 2023-08-27 DIAGNOSIS — R52 Pain, unspecified: Secondary | ICD-10-CM | POA: Insufficient documentation

## 2023-08-27 DIAGNOSIS — E162 Hypoglycemia, unspecified: Secondary | ICD-10-CM | POA: Diagnosis not present

## 2023-08-27 DIAGNOSIS — Q8781 Alport syndrome: Secondary | ICD-10-CM | POA: Diagnosis not present

## 2023-08-27 DIAGNOSIS — H9190 Unspecified hearing loss, unspecified ear: Secondary | ICD-10-CM | POA: Insufficient documentation

## 2023-08-27 DIAGNOSIS — Z992 Dependence on renal dialysis: Secondary | ICD-10-CM | POA: Diagnosis not present

## 2023-08-27 DIAGNOSIS — R519 Headache, unspecified: Secondary | ICD-10-CM | POA: Insufficient documentation

## 2023-08-27 LAB — GLUCOSE, POCT (MANUAL RESULT ENTRY): POC Glucose: 146 mg/dL — AB (ref 70–99)

## 2023-08-27 LAB — TROPONIN I (HIGH SENSITIVITY)
Troponin I (High Sensitivity): 25 ng/L — ABNORMAL HIGH (ref <18)
Troponin I (High Sensitivity): 23 ng/L — ABNORMAL HIGH (ref <18)

## 2023-08-27 LAB — CBC
HCT: 39 % (ref 39.0–52.0)
Hemoglobin: 12.2 g/dL — ABNORMAL LOW (ref 13.0–17.0)
MCH: 27.4 pg (ref 26.0–34.0)
MCHC: 31.3 g/dL (ref 30.0–36.0)
MCV: 87.4 fL (ref 80.0–100.0)
Platelets: 172 10*3/uL (ref 150–400)
RBC: 4.46 MIL/uL (ref 4.22–5.81)
RDW: 14.2 % (ref 11.5–15.5)
WBC: 3.6 10*3/uL — ABNORMAL LOW (ref 4.0–10.5)
nRBC: 0 % (ref 0.0–0.2)

## 2023-08-27 LAB — MAGNESIUM: Magnesium: 3.2 mg/dL — ABNORMAL HIGH (ref 1.7–2.4)

## 2023-08-27 LAB — CBG MONITORING, ED: Glucose-Capillary: 90 mg/dL (ref 70–99)

## 2023-08-27 LAB — PHOSPHORUS: Phosphorus: 9.2 mg/dL — ABNORMAL HIGH (ref 2.5–4.6)

## 2023-08-27 MED ORDER — ONDANSETRON 4 MG PO TBDP: 4.0000 mg | ORAL_TABLET | Freq: Once | ORAL | Status: DC

## 2023-08-27 MED ORDER — HYDROMORPHONE HCL 1 MG/ML IJ SOLN
0.5000 mg | Freq: Once | INTRAMUSCULAR | Status: AC
  Administered 2023-08-27: 0.5 mg via INTRAVENOUS
  Filled 2023-08-27: qty 1

## 2023-08-27 MED ORDER — FENTANYL CITRATE PF 50 MCG/ML IJ SOSY
25.0000 ug | PREFILLED_SYRINGE | Freq: Once | INTRAMUSCULAR | Status: AC
  Administered 2023-08-27: 25 ug via INTRAVENOUS
  Filled 2023-08-27: qty 1

## 2023-08-27 NOTE — Progress Notes (Signed)
    SUBJECTIVE:   CHIEF COMPLAINT / HPI:   The patient, a 38 year old with a history of ESRD and uncontrolled hypertension, presents with severe diarrhea that has been ongoing for over a month. The patient reports having up to ten episodes of diarrhea per night, which has been disrupting their sleep - up to 10x/night. The diarrhea is described as watery and dark, with a sandy residue at the bottom of the toilet. The patient also reports occasional floating stools. The patient denies any bright blood in the stool. The diarrhea is associated with mild abdominal pain, which seems to be related to the frequency of bowel movements. The patient also reports occasional BM incontinence.  While walking to the lab at then end of the visit, the patient reported a sudden onset of generalized weakness and numbness in the mouth. The patient describes feeling very weak and not feeling right. The patient also reports a loss of vision in the left eye. The patient has a history of hypoglycemia during dialysis sessions, but blood glucose was checked and found to be normal.  He denies any chest pain or shortness of breath.  Admits to a mild headache.  Vitals remained stable.  PERTINENT  PMH / PSH: ESRD, HTN  OBJECTIVE:   BP (!) 165/92   Pulse 90   Ht 5\' 8"  (1.727 m)   Wt 177 lb (80.3 kg)   SpO2 99%   BMI 26.91 kg/m    General: NAD, pleasant, able to participate in exam Cardiac: RRR, no murmurs. Respiratory: CTAB, normal effort, No wheezes, rales or rhonchi Abdomen: Bowel sounds present, nontender, nondistended, soft Extremities: no edema BLEs Neuro: Patient seems a little slower to answer questions after sudden onset of weakness, cranial nerves II through XII intact with exception of very very mild left mouth drooping and drooling and blurry vision in left eye.  Very mild decreased muscle strength of left upper and lower extremities when compared to the R Psych: Normal affect and mood  ASSESSMENT/PLAN:    Neurological abnormality Acute very mild left-sided weakness, mouth numbness, drooling, slower to speak, and vision changes suggest possible stroke.  Vitals stable and point-of-care glucose in 140s Of note, does have hx of vision loss in L eye and follows with ophthalmology.  - Called 911 for emergency transport to hospital for stroke workup  - ED charge nurse and inpatient team notified    Diarrhea Chronic diarrhea over a month with nocturnal watery stools. Low suspicion for C. difficile. Differential includes infection, malabsorption, or IBD. No nausea, vomiting, or recent blood in stools.  Previously referred to GI but does not have appointment until next month.  Reassuringly, vitals are stable and abdominal exam is benign. - Can have BMP checked in the ED to check for electrolyte disturbances, hopefully they can do GIPP there.  Otherwise I will order this at future visit scheduled for 4/21     Dr. Glenn Lange, DO  [facility]

## 2023-08-27 NOTE — Assessment & Plan Note (Addendum)
 Acute very mild left-sided weakness, mouth numbness, drooling, slower to speak, and vision changes suggest possible stroke.  Vitals stable and point-of-care glucose in 140s Of note, does have hx of vision loss in L eye and follows with ophthalmology.  - Called 911 for emergency transport to hospital for stroke workup  - ED charge nurse and inpatient team notified

## 2023-08-27 NOTE — ED Provider Notes (Addendum)
  Physical Exam  BP (!) 181/100 (BP Location: Right Arm)   Pulse 83   Temp 97.9 F (36.6 C) (Oral)   Resp 18   Ht 5\' 8"  (1.727 m)   Wt 79.4 kg   SpO2 99%   BMI 26.61 kg/m   Physical Exam Vitals and nursing note reviewed.  HENT:     Head: Normocephalic and atraumatic.  Eyes:     Pupils: Pupils are equal, round, and reactive to light.  Cardiovascular:     Rate and Rhythm: Normal rate and regular rhythm.  Pulmonary:     Effort: Pulmonary effort is normal.     Breath sounds: Normal breath sounds.  Abdominal:     Palpations: Abdomen is soft.     Tenderness: There is no abdominal tenderness.  Skin:    General: Skin is warm and dry.  Neurological:     Mental Status: [name].  Psychiatric:        Mood and Affect: Mood normal.     Procedures  Procedures  ED Course / MDM   Clinical Course as of 08/27/23 2352  Thu Aug 27, 2023  1939 CT has been taken and read.  No acute intracranial findings.  Will obtain MRI brain [MP]  2341 No acute findings on CT head or MRI.  Patient at neurologic baseline stable.  A metabolic panel had not been ordered earlier.  We will check electrolytes here to ascertain need for dialysis here tonight versus discharge with plan for outpatient dialysis  I, Rafael Bun DO, am transitioning care of this patient to the oncoming provider pending metabolic panel, reevaluation and disposition [MP]    Clinical Course User Index [MP] Sallyanne Creamer, DO   Medical Decision Making I, Rafael Bun DO, have assumed care of this patient from the previous provider pending CT head, MRI, reevaluation disposition  Amount and/or Complexity of Data Reviewed Labs: ordered. Radiology: ordered.  Risk Prescription drug management.          Sallyanne Creamer, DO 08/27/23 2342    Sallyanne Creamer, DO 08/27/23 2352

## 2023-08-27 NOTE — ED Notes (Signed)
 Patient returned from MRI.

## 2023-08-27 NOTE — Telephone Encounter (Signed)
 Called patient to offer same day appointment. Patient has dialysis from 1200-1600.  Unfortunately, we do not have any early morning availability. Advised patient that due to concern for excessive diarrhea, we would recommend that he be evaluated in the urgent care.   Patient voices understanding.   Elsie Halo, RN

## 2023-08-27 NOTE — ED Notes (Signed)
 EDP Penna, Micheal A, DO made aware of patient's elevated blood pressure.

## 2023-08-27 NOTE — Patient Instructions (Signed)
 It was great to see you! Thank you for allowing me to participate in your care!  I recommend that you always bring your medications to each appointment as this makes it easy to ensure you are on the correct medications and helps us  not miss when refills are needed.  Our plans for today:  - We need to collect a stool sample to see if you have an infection before we can treat the diarrhea - Return for next apt on 4/21 to discuss skin and follow up on results   We are checking some labs today, I will call you if they are abnormal will send you a MyChart message or a letter if they are normal.  If you do not hear about your labs in the next 2 weeks please let us  know.  Take care and seek immediate care sooner if you develop any concerns.   Dr. Glenn Lange, DO [facility]

## 2023-08-27 NOTE — Assessment & Plan Note (Addendum)
 Chronic diarrhea over a month with nocturnal watery stools. Low suspicion for C. difficile. Differential includes infection, malabsorption, or IBD. No nausea, vomiting, or recent blood in stools.  Previously referred to GI but does not have appointment until next month.  Reassuringly, vitals are stable and abdominal exam is benign. - Can have BMP checked in the ED to check for electrolyte disturbances, hopefully they can do GIPP there.  Otherwise I will order this at future visit scheduled for 4/21

## 2023-08-27 NOTE — ED Provider Notes (Addendum)
 [place] EMERGENCY DEPARTMENT AT [facility] Provider Note   CSN: [id number] Arrival date & time: 08/27/23  1309     History  Chief Complaint  Patient presents with   Weakness    Pt BIB EMS from pcp office. Pt was complaining of generalized weakness and pain. Pt told provider he's had slurred speak for several days and was sent to ER. Pt has full movement of all four limbs, equal smile. T/TH/SAT dialysis   Diarrhea    [name] is a 38 y.o. male. 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 Presenting the patient was sent to the hospital after he was observed to be drooling and speaking abnormally, and his left side started hurting when he was walking to the lab at his PCP office. also reports chest pain that has since resolved, was transient. The patient also reports shortness of breath, which he attributes to needing dialysis. He missed dialysis appointment today due to current symptoms.   NO longer is experiencing the weakness, drooling, speaking abnormally.   Over a month of diarrhea/loose stools. Followed by PCP with this.   Home Medications Prior to Admission medications   Medication Sig Start Date End Date Taking? Authorizing Provider  adapalene  (ADAPALENE  TREATMENT) 0.1 % gel Apply topically at bedtime. 07/21/23   Glenn Lange, DO  calcium  acetate (PHOSLO ) 667 MG capsule Take 1 capsule (667 mg total) by mouth 3 (three) times daily with meals. 01/04/22   Edison Gore, MD  carvedilol  (COREG ) 25 MG tablet Take 1 tablet (25 mg total) by mouth 2 (two) times daily with a meal. 04/04/23   Glenn Lange, DO  divalproex  (DEPAKOTE  ER) 500 MG 24 hr tablet Take 1 tablet (500 mg total) by mouth daily. 04/04/23   Glenn Lange, DO  famotidine  (PEPCID ) 20 MG tablet Take 1 tablet (20 mg total) by mouth daily as needed for indigestion or heartburn. 04/04/23   Glenn Lange, DO  fluticasone  (FLONASE ) 50 MCG/ACT nasal spray Place  1 spray into both nostrils daily. 05/14/23 05/13/24  Rayma Calandra, DO  gabapentin  (NEURONTIN ) 100 MG capsule Please take 100 mg after dialysis on Tuesday, Thursday and Saturday 05/14/23   Rayma Calandra, DO  hydrALAZINE  (APRESOLINE ) 100 MG tablet Take 1 tablet (100 mg total) by mouth 3 (three) times daily. 04/04/23   Glenn Lange, DO  hydrOXYzine  (ATARAX ) 10 MG tablet Take 0.5 tablets (5 mg total) by mouth 2 (two) times daily as needed for itching. 07/21/23   Glenn Lange, DO  LOPERAMIDE  HCL PO Take by mouth. 05/07/23 05/05/24  [provider]  Methoxy PEG-Epoetin Beta (MIRCERA IJ) Inject 1 Dose into the skin every 14 (fourteen) days. 04/11/23 04/26/24  [provider]  Methoxy PEG-Epoetin Beta (MIRCERA IJ) Mircera 06/18/23 06/16/24  [provider]  minoxidil  (LONITEN ) 10 MG tablet Take 1 tablet (10 mg total) by mouth 2 (two) times daily. 07/29/23   Patwardhan, Kaye Parsons, MD  NIFEdipine  (ADALAT  CC) 60 MG 24 hr tablet Take 1 tablet (60 mg total) by mouth daily. 05/14/23   Rayma Calandra, DO  pantoprazole  (PROTONIX ) 40 MG tablet Take 1 tablet (40 mg total) by mouth daily. 04/04/23   Glenn Lange, DO  sevelamer  carbonate (RENVELA ) 800 MG tablet Take 2 tablets (1,600 mg total) by mouth 3 (three) times daily with meals. 05/14/23   Rayma Calandra, DO      Allergies    Zestril [lisinopril], Nsaids, and Risperdal  [risperidone ]  Review of Systems   Review of Systems  Constitutional:  Negative for activity change.  HENT:  Negative for drooling and ear pain.   Cardiovascular:  Negative for chest pain and palpitations.  Gastrointestinal:  Positive for diarrhea.  Neurological:  Negative for seizures, syncope, facial asymmetry, speech difficulty, weakness and numbness.    Physical Exam Updated Vital Signs BP (!) 170/110 (BP Location: Left Arm)   Pulse 80   Temp 97.8 F (36.6 C) (Oral)   Resp 18   Ht 5\' 8"  (1.727 m)   Wt 79.4 kg   SpO2 100%   BMI 26.61 kg/m  Physical  Exam Vitals and nursing note reviewed.  Constitutional:      General: He is not in acute distress.    Appearance: He is well-developed.  HENT:     Head: Normocephalic and atraumatic.  Eyes:     Conjunctiva/sclera: Conjunctivae normal.  Cardiovascular:     Rate and Rhythm: Normal rate and regular rhythm.     Heart sounds: No murmur heard. Pulmonary:     Effort: Pulmonary effort is normal. No respiratory distress.     Breath sounds: Normal breath sounds.  Abdominal:     Palpations: Abdomen is soft.     Tenderness: There is no abdominal tenderness.  Musculoskeletal:        General: No swelling.     Cervical back: Neck supple.  Skin:    General: Skin is warm and dry.     Capillary Refill: Capillary refill takes less than 2 seconds.  Neurological:     General: No focal deficit present.     Mental Status: [name] and oriented to person, place, and time.     Sensory: No sensory deficit.     Motor: No weakness.  Psychiatric:        Mood and Affect: Mood normal.     ED Results / Procedures / Treatments   Labs (all labs ordered are listed, but only abnormal results are displayed) Labs Reviewed  CBC - Abnormal; Notable for the following components:      Result Value   WBC 3.6 (*)    Hemoglobin 12.2 (*)    All other components within normal limits  PHOSPHORUS - Abnormal; Notable for the following components:   Phosphorus 9.2 (*)    All other components within normal limits  TROPONIN I (HIGH SENSITIVITY) - Abnormal; Notable for the following components:   Troponin I (High Sensitivity) 25 (*)    All other components within normal limits  MAGNESIUM  CBG MONITORING, ED  TROPONIN I (HIGH SENSITIVITY)    EKG EKG Interpretation Date/Time:  Thursday August 27 2023 13:27:40 EDT Ventricular Rate:  85 PR Interval:  173 QRS Duration:  79 QT Interval:  390 QTC Calculation: 464 R Axis:   42  Text Interpretation: Sinus rhythm Probable left atrial enlargement Anterior infarct,  old Nonspecific T abnormalities, lateral leads when compared top rior, similar appearnce No STEMI Confirmed by Wynell Heath ([id number]) on 08/27/2023 1:41:59 PM  Radiology No results found.  Procedures Procedures - none    Medications Ordered in ED Medications - No data to display  ED Course/ Medical Decision Making/ A&P                                 Medical Decision Making Amount and/or Complexity of Data Reviewed Labs: ordered. Radiology: ordered.  Risk Prescription drug management.  Medical Decision Making:   [name] is a 38 y.o. male who presented to the ED today with transient left sided pain and ?weakness detailed above.    Patient placed on continuous vitals and telemetry monitoring while in ED which was reviewed periodically.  Complete initial physical exam performed, notably, with no obvious deficits or neurological abnormalities Reviewed and confirmed nursing documentation for past medical history, family history, social history.    Initial Assessment:   With the patient's presentation of left sided symptoms, need to rule out acute CVA versus TIA. These are considered less likely due to history of present illness and physical exam findings.   This is most consistent with an acute complicated illness  Initial Plan:   Screening labs including CBC and Metabolic panel to evaluate for infectious or metabolic etiology of disease.  CXR to evaluate for structural/infectious intrathoracic pathology.  EKG to evaluate for cardiac pathology CT Head ordered  Objective evaluation as below reviewed   Initial Study Results:   Laboratory  All laboratory results reviewed without evidence of clinically relevant pathology.   Exceptions include: phos 9.2 (ESRD patient)   EKG EKG was reviewed independently. Rate, rhythm, axis, intervals all examined and without medically relevant abnormality. ST segments without concerns for elevations.    Radiology:  Pending CT scan  and will obtain MR if CT head negative.    Consults: Case discussed with neurology, see plan.   Final Assessment and Plan:    Left Sided Weakness, resolved  Left sided pain: R/o CVA versus TIA, no evidence of sz disorder. If both CT and MR negative, send home with PCP follow up as he is hemodynamically stable and without acute neurologic abnormalities. Would be unable to start ASA given history of GI bleed. Patient is high risk for CVA but reassured by neurologic exam. Ran this case by neurology, and they agreed.   Sign out to Dr. Ranelle Buys to await CT and MR brain then anticipate discharge if appropriate. Checking lytes, on scheduled HD at home.  Trop without overt elevation, no current chest pain.   Fentanyl  x1 for pain.    Clinical Impression:  1. Generalized pain      Data Unavailable      Final Clinical Impression(s) / ED Diagnoses Final diagnoses:  Generalized pain    Rx / DC Orders ED Discharge Orders     None         Ernestina Headland, MD 08/27/23 1552      Ernestina Headland, MD 08/27/23 1609    Tegeler, Marine Sia, MD 08/28/23 [phone]

## 2023-08-27 NOTE — Discharge Instructions (Addendum)
 You received dialysis in the emergency department

## 2023-08-27 NOTE — ED Notes (Signed)
 Patient transported to MRI

## 2023-08-28 DIAGNOSIS — R109 Unspecified abdominal pain: Secondary | ICD-10-CM | POA: Diagnosis not present

## 2023-08-28 LAB — BASIC METABOLIC PANEL WITH GFR
Anion gap: 19 — ABNORMAL HIGH (ref 5–15)
BUN: 110 mg/dL — ABNORMAL HIGH (ref 6–20)
CO2: 18 mmol/L — ABNORMAL LOW (ref 22–32)
Calcium: 9.1 mg/dL (ref 8.9–10.3)
Chloride: 98 mmol/L (ref 98–111)
Creatinine, Ser: 19.79 mg/dL — ABNORMAL HIGH (ref 0.61–1.24)
GFR, Estimated: 3 mL/min — ABNORMAL LOW (ref >60)
Glucose, Bld: 87 mg/dL (ref 70–99)
Potassium: 6.1 mmol/L — ABNORMAL HIGH (ref 3.5–5.1)
Sodium: 135 mmol/L (ref 135–145)

## 2023-08-28 LAB — RENAL FUNCTION PANEL
Albumin: 3.5 g/dL (ref 3.5–5.0)
Anion gap: 19 — ABNORMAL HIGH (ref 5–15)
BUN: 112 mg/dL — ABNORMAL HIGH (ref 6–20)
CO2: 18 mmol/L — ABNORMAL LOW (ref 22–32)
Calcium: 9 mg/dL (ref 8.9–10.3)
Chloride: 98 mmol/L (ref 98–111)
Creatinine, Ser: 20.11 mg/dL — ABNORMAL HIGH (ref 0.61–1.24)
GFR, Estimated: 3 mL/min — ABNORMAL LOW (ref >60)
Glucose, Bld: 97 mg/dL (ref 70–99)
Phosphorus: 10 mg/dL — ABNORMAL HIGH (ref 2.5–4.6)
Potassium: 5.2 mmol/L — ABNORMAL HIGH (ref 3.5–5.1)
Sodium: 135 mmol/L (ref 135–145)

## 2023-08-28 LAB — CBC
HCT: 34.6 % — ABNORMAL LOW (ref 39.0–52.0)
Hemoglobin: 11.2 g/dL — ABNORMAL LOW (ref 13.0–17.0)
MCH: 28.2 pg (ref 26.0–34.0)
MCHC: 32.4 g/dL (ref 30.0–36.0)
MCV: 87.2 fL (ref 80.0–100.0)
Platelets: 161 10*3/uL (ref 150–400)
RBC: 3.97 MIL/uL — ABNORMAL LOW (ref 4.22–5.81)
RDW: 14.3 % (ref 11.5–15.5)
WBC: 4.2 10*3/uL (ref 4.0–10.5)
nRBC: 0 % (ref 0.0–0.2)

## 2023-08-28 LAB — CBG MONITORING, ED
Glucose-Capillary: 57 mg/dL — ABNORMAL LOW (ref 70–99)
Glucose-Capillary: 182 mg/dL — ABNORMAL HIGH (ref 70–99)
Glucose-Capillary: 115 mg/dL — ABNORMAL HIGH (ref 70–99)

## 2023-08-28 LAB — HEPATITIS B SURFACE ANTIGEN: Hepatitis B Surface Ag: NONREACTIVE

## 2023-08-28 LAB — GLUCOSE, CAPILLARY: Glucose-Capillary: 80 mg/dL (ref 70–99)

## 2023-08-28 MED ORDER — SODIUM ZIRCONIUM CYCLOSILICATE 10 G PO PACK
10.0000 g | PACK | Freq: Once | ORAL | Status: AC
  Administered 2023-08-28: 10 g via ORAL
  Filled 2023-08-28: qty 1

## 2023-08-28 MED ORDER — LIDOCAINE HCL (PF) 1 % IJ SOLN: 5.0000 mL | INTRAMUSCULAR | Status: DC | PRN

## 2023-08-28 MED ORDER — CHLORHEXIDINE GLUCONATE CLOTH 2 % EX PADS: 6.0000 | MEDICATED_PAD | Freq: Every day | CUTANEOUS | Status: DC

## 2023-08-28 MED ORDER — ALTEPLASE 2 MG IJ SOLR: 2.0000 mg | Freq: Once | INTRAMUSCULAR | Status: DC | PRN

## 2023-08-28 MED ORDER — HYDROCODONE-ACETAMINOPHEN 5-325 MG PO TABS
1.0000 | ORAL_TABLET | Freq: Once | ORAL | Status: AC
  Administered 2023-08-28: 2 via ORAL
  Filled 2023-08-28: qty 2

## 2023-08-28 MED ORDER — PENTAFLUOROPROP-TETRAFLUOROETH EX AERO: 1.0000 | INHALATION_SPRAY | CUTANEOUS | Status: DC | PRN

## 2023-08-28 MED ORDER — CALCIUM GLUCONATE-NACL 1-0.675 GM/50ML-% IV SOLN
1.0000 g | Freq: Once | INTRAVENOUS | Status: AC
  Administered 2023-08-28: 1000 mg via INTRAVENOUS
  Filled 2023-08-28: qty 50

## 2023-08-28 MED ORDER — DEXTROSE 50 % IV SOLN
1.0000 | Freq: Once | INTRAVENOUS | Status: AC
  Administered 2023-08-28: 50 mL via INTRAVENOUS
  Filled 2023-08-28: qty 50

## 2023-08-28 MED ORDER — DEXTROSE 50 % IV SOLN
INTRAVENOUS | Status: AC
  Filled 2023-08-28: qty 50

## 2023-08-28 MED ORDER — CARVEDILOL 12.5 MG PO TABS: 25.0000 mg | ORAL_TABLET | Freq: Once | ORAL | Status: DC

## 2023-08-28 MED ORDER — HEPARIN SODIUM (PORCINE) 1000 UNIT/ML DIALYSIS
1000.0000 [IU] | INTRAMUSCULAR | Status: DC | PRN
  Administered 2023-08-28: 1000 [IU] via INTRAVENOUS_CENTRAL
  Filled 2023-08-28 (×2): qty 1

## 2023-08-28 MED ORDER — ANTICOAGULANT SODIUM CITRATE 4% (200MG/5ML) IV SOLN: 5.0000 mL | Status: DC | PRN

## 2023-08-28 MED ORDER — HEPARIN SODIUM (PORCINE) 1000 UNIT/ML DIALYSIS
1000.0000 [IU] | INTRAMUSCULAR | Status: DC | PRN
  Administered 2023-08-28: 3200 [IU]
  Filled 2023-08-28 (×2): qty 1

## 2023-08-28 MED ORDER — LIDOCAINE-PRILOCAINE 2.5-2.5 % EX CREA: 1.0000 | TOPICAL_CREAM | CUTANEOUS | Status: DC | PRN

## 2023-08-28 MED ORDER — NITROGLYCERIN 0.4 MG SL SUBL
0.4000 mg | SUBLINGUAL_TABLET | Freq: Once | SUBLINGUAL | Status: AC
  Administered 2023-08-28: 0.4 mg via SUBLINGUAL
  Filled 2023-08-28: qty 1

## 2023-08-28 MED ORDER — INSULIN ASPART 100 UNIT/ML IV SOLN
5.0000 [IU] | Freq: Once | INTRAVENOUS | Status: AC
  Administered 2023-08-28: 5 [IU] via INTRAVENOUS

## 2023-08-28 NOTE — Procedures (Signed)
 Received patient in bed to unit.  Alert and oriented.  Informed consent signed and in chart.   TX duration: 3.5 hours  Patient tolerated well.  Transported back to the room  Alert, without acute distress.  Hand-off given to patient's nurse.   Access used: right cath Access issues: none  Total UF removed: 3.5 liters Medication(s) given: hydrocodone -acetaminophen , heparin    Clover Dao, RN [facility]

## 2023-08-28 NOTE — ED Notes (Addendum)
 Patient requested lunch tray and CBG check, CBG is 115, patient given juice until lunch is able to arrive.

## 2023-08-28 NOTE — ED Notes (Signed)
 Pt ambulatory to bathroom

## 2023-08-28 NOTE — ED Provider Notes (Addendum)
  Provider Note MRN:  [id number]  Arrival date & time: 08/28/23    ED Course and Medical Decision Making  Assumed care of patient at sign-out or upon transfer.  Drooling and speaking abnormally today, workup for stroke is unremarkable.  Patient missed his dialysis session today.  Pending labs, potassium, will touch base with nephrology to see if patient needs dialysis here or can wait until Saturday.  12:45 AM update: Potassium elevated 6.1, and so I suspect patient would most benefit from dialysis here at [facility] prior to discharge.  Will reach out to nephrology.  Suspect he will receive dialysis and return to the emergency department for reevaluation, candidate for discharge thereafter.  Providing temporizing hyperkalemia medications.  On my exam patient is resting comfortably in no acute distress.  12:50am:  plan confirmed with Dr. Christianne Cowper  .Critical Care  Performed by: Edson Graces, MD Authorized by: Edson Graces, MD   Critical care provider statement:    Critical care time (minutes):  32   Critical care was necessary to treat or prevent imminent or life-threatening deterioration of the following conditions:  Metabolic crisis (hyperkalemia)   Critical care was time spent personally by me on the following activities:  Development of treatment plan with patient or surrogate, discussions with consultants, evaluation of patient's response to treatment, examination of patient, ordering and review of laboratory studies, ordering and review of radiographic studies, ordering and performing treatments and interventions, pulse oximetry, re-evaluation of patient's condition and review of old charts   Final Clinical Impressions(s) / ED Diagnoses     ICD-10-CM   1. Generalized pain  R52     2. ESRD on dialysis [facility])  N18.6    Z99.2       ED Discharge Orders     None        Merrick Abe. Harless Lien, MD [facility] [email or web address]     Edson Graces, MD 08/28/23 0045    Edson Graces, MD 08/28/23 [phone]

## 2023-08-28 NOTE — ED Notes (Addendum)
 Pt states he woke up sweating and shaky/ MD made aware/ BS- 57/ pt given apple juice and cereal

## 2023-08-28 NOTE — Procedures (Signed)
 Asked to see this patient for hospital dialysis. Pt presented w/ transient stroke-like symptoms yesterday to ED. Was better upon arrival and had CVA w/u which was negative. Pt missed HD yesterday and K+ was 6.1.  Nephrology asked to see for dialysis. The plan is for ED HD. Pt is not to be admitted at this time. Pt will go to the dialysis unit when they are ready for the patient. When dialysis is completed pt will be sent back to ED for reassessment.   Pt seen in HD unit. Exam is good, no vol overload or resp distress. Getting 1k bath then 2K bath after 40 min. BP's good.   I was present at the procedure, reviewed the HD regimen and made appropriate changes.   Myer Fret MD  [facility] 08/28/2023, 12:21 PM

## 2023-08-28 NOTE — ED Notes (Signed)
 Pt continues to remove EKG leads

## 2023-08-28 NOTE — ED Notes (Addendum)
 MD aware of BP

## 2023-08-28 NOTE — ED Provider Notes (Signed)
 Patient signed out to me by previous provider. Please refer to their note for full HPI.  Briefly this is a 38 year old male who went to hemodialysis from the ER.  On reevaluation he is well-appearing, ambulated to the bathroom without difficulty.  He has been hypertensive since being here but has missed his home medications.  Home medication ordered and offered.  Otherwise patient is stable for discharge.  Patient at this time appears safe and stable for discharge and close outpatient follow up. Discharge plan and strict return to ED precautions discussed, patient verbalizes understanding and agreement.   Flonnie Humphrey, DO 08/28/23 1506

## 2023-08-29 LAB — HEPATITIS B SURFACE ANTIBODY, QUANTITATIVE: Hep B S AB Quant (Post): 227 m[IU]/mL

## 2023-08-30 ENCOUNTER — Emergency Department (HOSPITAL_COMMUNITY)
Admission: EM | Admit: 2023-08-30 | Discharge: 2023-08-31 | Disposition: A | Discharge status: Home / Self Care | Attending: Emergency Medicine | Admitting: Emergency Medicine

## 2023-08-30 ENCOUNTER — Encounter (HOSPITAL_COMMUNITY): Payer: Self-pay | Admitting: *Deleted

## 2023-08-30 ENCOUNTER — Emergency Department (HOSPITAL_COMMUNITY)

## 2023-08-30 ENCOUNTER — Other Ambulatory Visit: Payer: Self-pay

## 2023-08-30 DIAGNOSIS — J9601 Acute respiratory failure with hypoxia: Secondary | ICD-10-CM | POA: Diagnosis not present

## 2023-08-30 DIAGNOSIS — Z992 Dependence on renal dialysis: Secondary | ICD-10-CM | POA: Diagnosis not present

## 2023-08-30 DIAGNOSIS — Z79899 Other long term (current) drug therapy: Secondary | ICD-10-CM | POA: Diagnosis not present

## 2023-08-30 DIAGNOSIS — R0602 Shortness of breath: Secondary | ICD-10-CM | POA: Diagnosis present

## 2023-08-30 DIAGNOSIS — E875 Hyperkalemia: Secondary | ICD-10-CM | POA: Insufficient documentation

## 2023-08-30 DIAGNOSIS — D649 Anemia, unspecified: Secondary | ICD-10-CM | POA: Diagnosis not present

## 2023-08-30 DIAGNOSIS — I509 Heart failure, unspecified: Secondary | ICD-10-CM | POA: Insufficient documentation

## 2023-08-30 DIAGNOSIS — N186 End stage renal disease: Secondary | ICD-10-CM | POA: Diagnosis not present

## 2023-08-30 LAB — COMPREHENSIVE METABOLIC PANEL WITH GFR
ALT: 19 U/L (ref 0–44)
AST: 23 U/L (ref 15–41)
Albumin: 3.8 g/dL (ref 3.5–5.0)
Alkaline Phosphatase: 125 U/L (ref 38–126)
Anion gap: 19 — ABNORMAL HIGH (ref 5–15)
BUN: 99 mg/dL — ABNORMAL HIGH (ref 6–20)
CO2: 20 mmol/L — ABNORMAL LOW (ref 22–32)
Calcium: 8.9 mg/dL (ref 8.9–10.3)
Chloride: 90 mmol/L — ABNORMAL LOW (ref 98–111)
Creatinine, Ser: 17.89 mg/dL — ABNORMAL HIGH (ref 0.61–1.24)
GFR, Estimated: 3 mL/min — ABNORMAL LOW (ref >60)
Glucose, Bld: 107 mg/dL — ABNORMAL HIGH (ref 70–99)
Potassium: 5.4 mmol/L — ABNORMAL HIGH (ref 3.5–5.1)
Sodium: 129 mmol/L — ABNORMAL LOW (ref 135–145)
Total Bilirubin: 0.7 mg/dL (ref 0.0–1.2)
Total Protein: 7.1 g/dL (ref 6.5–8.1)

## 2023-08-30 LAB — CBC
HCT: 34.7 % — ABNORMAL LOW (ref 39.0–52.0)
Hemoglobin: 11.5 g/dL — ABNORMAL LOW (ref 13.0–17.0)
MCH: 28.4 pg (ref 26.0–34.0)
MCHC: 33.1 g/dL (ref 30.0–36.0)
MCV: 85.7 fL (ref 80.0–100.0)
Platelets: 179 10*3/uL (ref 150–400)
RBC: 4.05 MIL/uL — ABNORMAL LOW (ref 4.22–5.81)
RDW: 14.4 % (ref 11.5–15.5)
WBC: 3.5 10*3/uL — ABNORMAL LOW (ref 4.0–10.5)
nRBC: 0 % (ref 0.0–0.2)

## 2023-08-30 LAB — BRAIN NATRIURETIC PEPTIDE: B Natriuretic Peptide: 268.4 pg/mL — ABNORMAL HIGH (ref 0.0–100.0)

## 2023-08-30 NOTE — ED Triage Notes (Signed)
 The pt  is c/o generalized body pain he is dialysis and his dialysis catheter has pulled loose that is in his rt upper chest  he is deaf he reads lips

## 2023-08-31 ENCOUNTER — Encounter (HOSPITAL_COMMUNITY): Payer: Self-pay | Admitting: Cardiology

## 2023-08-31 ENCOUNTER — Ambulatory Visit: Admitting: Student

## 2023-08-31 ENCOUNTER — Emergency Department (HOSPITAL_COMMUNITY)

## 2023-08-31 DIAGNOSIS — J9601 Acute respiratory failure with hypoxia: Secondary | ICD-10-CM | POA: Diagnosis not present

## 2023-08-31 LAB — RESP PANEL BY RT-PCR (RSV, FLU A&B, COVID)  RVPGX2
Influenza A by PCR: NEGATIVE
Influenza B by PCR: NEGATIVE
Resp Syncytial Virus by PCR: NEGATIVE
SARS Coronavirus 2 by RT PCR: NEGATIVE

## 2023-08-31 LAB — TROPONIN I (HIGH SENSITIVITY)
Troponin I (High Sensitivity): 46 ng/L — ABNORMAL HIGH (ref <18)
Troponin I (High Sensitivity): 42 ng/L — ABNORMAL HIGH (ref <18)

## 2023-08-31 LAB — CK: Total CK: 315 U/L (ref 49–397)

## 2023-08-31 MED ORDER — ACETAMINOPHEN 500 MG PO TABS
1000.0000 mg | ORAL_TABLET | Freq: Once | ORAL | Status: AC
  Administered 2023-08-31: 1000 mg via ORAL
  Filled 2023-08-31: qty 2

## 2023-08-31 MED ORDER — LIDOCAINE-PRILOCAINE 2.5-2.5 % EX CREA: 1.0000 | TOPICAL_CREAM | CUTANEOUS | Status: DC | PRN

## 2023-08-31 MED ORDER — HYDROMORPHONE HCL 1 MG/ML IJ SOLN
1.0000 mg | Freq: Once | INTRAMUSCULAR | Status: AC
  Administered 2023-08-31: 1 mg via INTRAVENOUS
  Filled 2023-08-31: qty 1

## 2023-08-31 MED ORDER — HEPARIN SODIUM (PORCINE) 1000 UNIT/ML IJ SOLN
INTRAMUSCULAR | Status: AC
  Filled 2023-08-31: qty 4

## 2023-08-31 MED ORDER — HEPARIN SODIUM (PORCINE) 1000 UNIT/ML DIALYSIS
1000.0000 [IU] | INTRAMUSCULAR | Status: DC | PRN
  Administered 2023-08-31: 1000 [IU]

## 2023-08-31 MED ORDER — LIDOCAINE HCL (PF) 1 % IJ SOLN: 5.0000 mL | INTRAMUSCULAR | Status: DC | PRN

## 2023-08-31 MED ORDER — NEPRO/CARBSTEADY PO LIQD: 237.0000 mL | ORAL | Status: DC | PRN

## 2023-08-31 MED ORDER — SODIUM ZIRCONIUM CYCLOSILICATE 10 G PO PACK
10.0000 g | PACK | Freq: Once | ORAL | Status: AC
  Administered 2023-08-31: 10 g via ORAL
  Filled 2023-08-31: qty 1

## 2023-08-31 MED ORDER — CHLORHEXIDINE GLUCONATE CLOTH 2 % EX PADS: 6.0000 | MEDICATED_PAD | Freq: Every day | CUTANEOUS | Status: DC

## 2023-08-31 MED ORDER — ALTEPLASE 2 MG IJ SOLR: 2.0000 mg | Freq: Once | INTRAMUSCULAR | Status: DC | PRN

## 2023-08-31 MED ORDER — ANTICOAGULANT SODIUM CITRATE 4% (200MG/5ML) IV SOLN: 5.0000 mL | Status: DC | PRN

## 2023-08-31 MED ORDER — PENTAFLUOROPROP-TETRAFLUOROETH EX AERO: 1.0000 | INHALATION_SPRAY | CUTANEOUS | Status: DC | PRN

## 2023-08-31 NOTE — ED Provider Notes (Signed)
 [place] EMERGENCY DEPARTMENT AT [facility] Provider Note   CSN: [id number] Arrival date & time: 08/30/23  2124     History  Chief Complaint  Patient presents with   Generalized Body Aches    [name] is a 38 y.o. male.  HPI   38 year old male with medical history significant for bipolar disorder, Alport syndrome, ESRD on hemodialysis Tuesday Thursday Saturday, last dialyzed on Friday, CHF, pericardial effusion, secondary hyperparathyroidism, depression, hearing loss presenting to the emerged part with concern for chest pain, shortness of breath, generalized bodyaches and myalgias.  The patient also states that his dialysis catheter has come out partially.  He noticed at least a couple centimeters of displacement of his dialysis catheter while at home.  He last underwent dialysis on Friday in the emergency department 4/18.  He endorses nonspecific chest discomfort as well as whole body aches and pains. He endorses shortness of breath.  Home Medications Prior to Admission medications   Medication Sig Start Date End Date Taking? Authorizing Provider  adapalene  (ADAPALENE  TREATMENT) 0.1 % gel Apply topically at bedtime. 07/21/23   Glenn Lange, DO  calcium  acetate (PHOSLO ) 667 MG capsule Take 1 capsule (667 mg total) by mouth 3 (three) times daily with meals. Patient not taking: Reported on 08/27/2023 01/04/22   Edison Gore, MD  carvedilol  (COREG ) 25 MG tablet Take 1 tablet (25 mg total) by mouth 2 (two) times daily with a meal. 04/04/23   Glenn Lange, DO  divalproex  (DEPAKOTE  ER) 500 MG 24 hr tablet Take 1 tablet (500 mg total) by mouth daily. 04/04/23   Glenn Lange, DO  fluticasone  (FLONASE ) 50 MCG/ACT nasal spray Place 1 spray into both nostrils daily. 05/14/23 05/13/24  Rayma Calandra, DO  gabapentin  (NEURONTIN ) 100 MG capsule Please take 100 mg after dialysis on Tuesday, Thursday and Saturday 05/14/23   Rayma Calandra, DO  hydrALAZINE  (APRESOLINE ) 100 MG tablet  Take 1 tablet (100 mg total) by mouth 3 (three) times daily. 04/04/23   Glenn Lange, DO  hydrOXYzine  (ATARAX ) 10 MG tablet Take 0.5 tablets (5 mg total) by mouth 2 (two) times daily as needed for itching. 07/21/23   Glenn Lange, DO  LOPERAMIDE  HCL PO Take by mouth. 05/07/23 05/05/24  [provider]  Methoxy PEG-Epoetin Beta (MIRCERA IJ) Inject 1 Dose into the skin every 14 (fourteen) days. 04/11/23 04/26/24  [provider]  Methoxy PEG-Epoetin Beta (MIRCERA IJ) Mircera 06/18/23 06/16/24  [provider]  minoxidil  (LONITEN ) 10 MG tablet Take 1 tablet (10 mg total) by mouth 2 (two) times daily. 07/29/23   Patwardhan, Kaye Parsons, MD  NIFEdipine  (ADALAT  CC) 60 MG 24 hr tablet Take 1 tablet (60 mg total) by mouth daily. 05/14/23   Rayma Calandra, DO  pantoprazole  (PROTONIX ) 40 MG tablet Take 1 tablet (40 mg total) by mouth daily. 04/04/23   Glenn Lange, DO  sevelamer  carbonate (RENVELA ) 800 MG tablet Take 2 tablets (1,600 mg total) by mouth 3 (three) times daily with meals. 05/14/23   Rayma Calandra, DO      Allergies    Zestril [lisinopril], Nsaids, and Risperdal  [risperidone ]    Review of Systems   Review of Systems  All other systems reviewed and are negative.   Physical Exam Updated Vital Signs BP (!) 188/118   Pulse 93   Temp 97.9 F (36.6 C) (Oral)   Resp 16   Ht 5\' 8"  (1.727 m)   Wt 79.4 kg   SpO2 (!) 89%   BMI  26.62 kg/m  Physical Exam Vitals and nursing note reviewed.  Constitutional:      General: He is not in acute distress.    Appearance: He is well-developed.  HENT:     Head: Normocephalic and atraumatic.  Eyes:     Conjunctiva/sclera: Conjunctivae normal.  Cardiovascular:     Rate and Rhythm: Normal rate and regular rhythm.  Pulmonary:     Effort: Pulmonary effort is normal. No respiratory distress.     Breath sounds: Rales present.     Comments: Bibasilar rales, no respiratory distress Abdominal:     Palpations: Abdomen is soft.      Tenderness: There is no abdominal tenderness.  Musculoskeletal:        General: No swelling.     Cervical back: Neck supple.     Right lower leg: No edema.     Left lower leg: No edema.  Skin:    General: Skin is warm and dry.     Capillary Refill: Capillary refill takes less than 2 seconds.  Neurological:     General: No focal deficit present.     Mental Status: [name]. Mental status is at baseline.  Psychiatric:        Mood and Affect: Mood normal.     ED Results / Procedures / Treatments   Labs (all labs ordered are listed, but only abnormal results are displayed) Labs Reviewed  COMPREHENSIVE METABOLIC PANEL WITH GFR - Abnormal; Notable for the following components:      Result Value   Sodium 129 (*)    Potassium 5.4 (*)    Chloride 90 (*)    CO2 20 (*)    Glucose, Bld 107 (*)    BUN 99 (*)    Creatinine, Ser 17.89 (*)    GFR, Estimated 3 (*)    Anion gap 19 (*)    All other components within normal limits  CBC - Abnormal; Notable for the following components:   WBC 3.5 (*)    RBC 4.05 (*)    Hemoglobin 11.5 (*)    HCT 34.7 (*)    All other components within normal limits  BRAIN NATRIURETIC PEPTIDE - Abnormal; Notable for the following components:   B Natriuretic Peptide 268.4 (*)    All other components within normal limits  RESP PANEL BY RT-PCR (RSV, FLU A&B, COVID)  RVPGX2  CK  TROPONIN I (HIGH SENSITIVITY)    EKG None  Radiology DG Chest 2 View Result Date: 08/30/2023 CLINICAL DATA:  Dialysis patient, is concerned that his dialysis catheter has pulled loose. EXAM: CHEST - 2 VIEW COMPARISON:  Portable chest 05/09/2023, CT thorax without contrast 01/06/2023. FINDINGS: Global moderate to severe enlargement of the cardiopericardial silhouette, prior CTA demonstrating a moderate-sized pericardial effusion with mild-to-moderate true cardiomegaly. Central vascular prominence is again noted but less than previously. There is slight central interstitial edema.  There is no substantial pleural effusion. The mediastinum is normally outlined. There is a right IJ dialysis catheter again noted terminating about the superior cavoatrial junction, stable in position. No pneumothorax. Thoracic cage intact.  Fall IMPRESSION: 1. Global moderate to severe enlargement of the cardiopericardial silhouette, prior CTA demonstrating a moderate-sized pericardial effusion with mild-to-moderate true cardiomegaly. 2. Central vascular prominence is again noted but less than previously. 3. Slight central interstitial edema. No substantial pleural effusion. 4. Right IJ dialysis catheter terminating about the superior cavoatrial junction, stable in position. Electronically Signed   By: Denman Fischer M.D.   On:  08/30/2023 23:05    Procedures Procedures    Medications Ordered in ED Medications  Chlorhexidine  Gluconate Cloth 2 % PADS 6 each (has no administration in time range)  HYDROmorphone  (DILAUDID ) injection 1 mg (1 mg Intravenous Given 08/31/23 0637)  sodium zirconium cyclosilicate  (LOKELMA ) packet 10 g (10 g Oral Given 08/31/23 1610)    ED Course/ Medical Decision Making/ A&P                                 Medical Decision Making Amount and/or Complexity of Data Reviewed Labs: ordered.  Risk Prescription drug management.    38 year old male with medical history significant for bipolar disorder, Alport syndrome, ESRD on hemodialysis Tuesday Thursday Saturday, last dialyzed on Friday, CHF, pericardial effusion, secondary hyperparathyroidism, depression, hearing loss presenting to the emerged part with concern for chest pain, shortness of breath, generalized bodyaches and myalgias.  The patient also states that his dialysis catheter has come out partially.  He noticed at least a couple centimeters of displacement of his dialysis catheter while at home.  He last underwent dialysis on Friday in the emergency department 4/18.  He endorses nonspecific chest discomfort as well  as whole body aches and pains. He endorses shortness of breath.  On arrival, the patient was afebrile, not tachycardic heart rate 89, tachypneic RR 24, BP 162/91, saturating 98% on room air.  On my evaluation, the patient's oxygen saturations dipped down to the 70s on room air and he was subsequently placed on O2 via nasal cannula with improvement.  He presents with worsening dyspnea, generalized chest discomfort.  He has a known pericardial effusion which has been seen previously on ultrasound.  He also last underwent dialysis on Friday.  He endorses generalized myalgias.  Considered viral syndrome, considered pericarditis, considered pneumonia, considered pulmonary edema and volume overload.  CXR: IMPRESSION:  1. Global moderate to severe enlargement of the cardiopericardial  silhouette, prior CTA demonstrating a moderate-sized pericardial  effusion with mild-to-moderate true cardiomegaly.  2. Central vascular prominence is again noted but less than  previously.  3. Slight central interstitial edema. No substantial pleural  effusion.  4. Right IJ dialysis catheter terminating about the superior  cavoatrial junction, stable in position.   Laboratory evaluation revealed BNP 268, CMP with mild hyperkalemia to 5.4, patient administered Lokelma  10 mg, BUN 99, creatinine 17.89, patient persistently has uremia on labs likely has uremic effusion.  Hemodynamically stable and hypertensive, low concern for cardiac tamponade.  CBC with an anemia to 11.5, leukopenia to 3.5.  COVID and flu PCR testing was collected and pending, CK ordered in addition to cardiac troponins.  I consulted nephrology given the patient's hypoxic respiratory failure, evidence of interstitial edema.  His dialysis catheter appears to be at the superior cavoatrial junction based on x-ray imaging.  Nephrology consult: Spoke with Dr. Zana Hesselbach to arrange for dialysis for the patient today, plan for reassessment after dialysis.   Final  Clinical Impression(s) / ED Diagnoses Final diagnoses:  Acute respiratory failure with hypoxia (HCC)  ESRD (end stage renal disease) (HCC)    Rx / DC Orders ED Discharge Orders     None         Rosealee Concha, MD 08/31/23 [phone]

## 2023-08-31 NOTE — ED Notes (Signed)
 Patient transported to Dialysis

## 2023-08-31 NOTE — Discharge Instructions (Addendum)
 It was a pleasure caring for you today in the emergency department.  We were able to complete your dialysis today. Please follow up with dialysis clinic for further sessions. Please follow up with vascular surgery for permanent dialysis acces placement. Call Dr Spurgeon Dyer office at number listed on paperwork. Please do not touch/pull on the dialysis catheter.   Please return to the emergency department for any worsening or worrisome symptoms.

## 2023-08-31 NOTE — Progress Notes (Deleted)
    SUBJECTIVE:   CHIEF COMPLAINT / HPI:   ***  PERTINENT  PMH / PSH: ***  OBJECTIVE:   There were no vitals taken for this visit. ***  General: NAD, pleasant, able to participate in exam Cardiac: RRR, no murmurs. Respiratory: CTAB, normal effort, No wheezes, rales or rhonchi Abdomen: Bowel sounds present, nontender, nondistended, no hepatosplenomegaly. Extremities: no edema or cyanosis. Skin: warm and dry, no rashes noted Neuro: alert, no obvious focal deficits Psych: Normal affect and mood  ASSESSMENT/PLAN:   No problem-specific Assessment & Plan notes found for this encounter.   [facility] [address] [place], [place]  [address] [phone]  Dr. Glenn Lange, DO [facility]    {    This will disappear when note is signed, click to select method of visit    :1}

## 2023-08-31 NOTE — ED Provider Notes (Signed)
  Provider Note MRN:  [id number]  Arrival date & time: 08/31/23    ED Course and Medical Decision Making  Assumed care from Dr Adrain Alar at shift change.  See note from prior team for complete details, in brief:  Patient seen earlier, he was sent for dialysis, has complete dialysis, he is feeling better.  Was able to eat a sandwich and have something to drink after getting back to the ER. Breathing comfortably on room air. Stable for discharge.    Recommend follow-up with nephrology/outpatient dialysis  Catheter appears to be in appropriate position, was able to complete HD today w/ 3L. Nephrology recommending pt f/u with vascular for permament HD access placement. Discussed this w/ the pt.    Patient in no distress and overall condition improved here in the ED. Detailed discussions were had with the patient/guardian regarding current findings, and need for close f/u with PCP or on call doctor. The patient/guardian has been instructed to return immediately if the symptoms worsen in any way for re-evaluation. Patient/guardian verbalized understanding and is in agreement with current care plan. All questions answered prior to discharge.   Procedures  Final Clinical Impressions(s) / ED Diagnoses     ICD-10-CM   1. ESRD (end stage renal disease) (HCC)  N18.6       ED Discharge Orders     None         Discharge Instructions      It was a pleasure caring for you today in the emergency department.  We were able to complete your dialysis today. Please follow up with dialysis clinic for further sessions. Please follow up with vascular surgery for permanent dialysis acces placement. Call Dr Spurgeon Dyer office at number listed on paperwork. Please do not touch/pull on the dialysis catheter.   Please return to the emergency department for any worsening or worrisome symptoms.          Russella Courts A, DO 08/31/23 1406

## 2023-08-31 NOTE — Procedures (Signed)
 Patient was seen on dialysis and the procedure was supervised.  BFR 400  Via TDC  BP is  159/103.  Multiple recent hospitalization related with fluid overload.  Now presented with generalized body pain and concerned about possible HD catheter coming out.  He is currently receiving dialysis well with good blood flow.  The HD catheter seems to be in place.  Discussed with the dialysis nurse, the HD catheter is not coming out.  Discussed with the patient not to pull out or pull in.  UF as tolerated.  He was referred to outpatient vascular surgeon for permanent access placement.   Patient appears to be tolerating treatment well.  Transferred to ER after dialysis for reassessment. Full consult to follow if patient is being admitted.  Shameeka Silliman [place] Planas 08/31/2023

## 2023-08-31 NOTE — ED Notes (Signed)
 Patient verbalized understanding of discharge instructions, Patient stated he is able to drive home on his own, Patient ambulated out to ED lobby.

## 2023-08-31 NOTE — Progress Notes (Signed)
 Received patient in bed to unit.  Alert and oriented.  Informed consent signed and in chart.    TX duration: 3  Patient tolerated well.  Transported back to the room  Alert, without acute distress.  Hand-off given to patient's nurse.   Access used: Right [facility] Access issues: None  Total UF removed: 3000 ml Medication(s) given: See [facility]   08/31/23 1254  Vitals  Temp 97.7 F (36.5 C)  Pulse Rate 84  Resp 17  BP (!) 180/106  SpO2 100 %  Post Treatment  Dialyzer Clearance Clear  Hemodialysis Intake (mL) 0 mL  Liters Processed 43.2  Fluid Removed (mL) 3000 mL  Tolerated HD Treatment Yes

## 2023-09-01 ENCOUNTER — Other Ambulatory Visit (INDEPENDENT_AMBULATORY_CARE_PROVIDER_SITE_OTHER): Payer: Self-pay | Admitting: Physician Assistant

## 2023-09-01 ENCOUNTER — Ambulatory Visit (HOSPITAL_COMMUNITY)
Admission: RE | Admit: 2023-09-01 | Discharge: 2023-09-01 | Disposition: A | Discharge status: Home / Self Care | Source: Ambulatory Visit | Attending: Physician Assistant | Admitting: Physician Assistant

## 2023-09-01 ENCOUNTER — Telehealth: Payer: Self-pay

## 2023-09-01 DIAGNOSIS — H903 Sensorineural hearing loss, bilateral: Secondary | ICD-10-CM

## 2023-09-01 DIAGNOSIS — J392 Other diseases of pharynx: Secondary | ICD-10-CM

## 2023-09-01 NOTE — Transitions of Care (Post Inpatient/ED Visit) (Unsigned)
   09/01/2023  Name: [name] MRN: [id number] DOB: [birth year]  Today's TOC FU Call Status: Today's TOC FU Call Status:: Unsuccessful Call (1st Attempt) Unsuccessful Call (1st Attempt) Date: 09/01/23  Attempted to reach the patient regarding the most recent Inpatient/ED visit.  Follow Up Plan: Additional outreach attempts will be made to reach the patient to complete the Transitions of Care (Post Inpatient/ED visit) call.   Signature Darrall Ellison, LPN [facility] Nurse Health Advisor Direct Dial  [phone]

## 2023-09-02 NOTE — Transitions of Care (Post Inpatient/ED Visit) (Signed)
 09/02/2023  Name: [name] MRN: [id number] DOB: 10/05/1985  Today's TOC FU Call Status: Today's TOC FU Call Status:: Successful TOC FU Call Completed Unsuccessful Call (1st Attempt) Date: 09/01/23 Unsuccessful Call (2nd Attempt) Date: 09/02/23 [facility] FU Call Complete Date: 09/02/23 Patient's Name and Date of Birth confirmed.  Transition Care Management Follow-up Telephone Call Date of Discharge: 08/31/23 Discharge Facility: [facility]) Type of Discharge: Emergency Department Edward for ED Visit: Other: (AKF) How have you been since you were released from the hospital?: Same Any questions or concerns?: No  Items Reviewed: Did you receive and understand the discharge instructions provided?: No Medications obtained,verified, and reconciled?: Yes (Medications Reviewed) Any new allergies since your discharge?: No Dietary orders reviewed?: Yes Do you have support at home?: No  Medications Reviewed Today: Medications Reviewed Today     Reviewed by Darrall Ellison, LPN (Licensed Practical Nurse) on 09/02/23 at 1202  Med List Status: <None>   Medication Order Taking? Sig Documenting Provider Last Dose Status Informant  adapalene  (ADAPALENE  TREATMENT) 0.1 % gel [id number] No Apply topically at bedtime. Glenn Lange, DO 08/26/2023 Bedtime Active Self, Pharmacy Records  calcium  acetate (PHOSLO ) 667 MG capsule [id number] No Take 1 capsule (667 mg total) by mouth 3 (three) times daily with meals.  Patient not taking: Reported on 08/27/2023   Edison Gore, MD Not Taking Active Self, Pharmacy Records  carvedilol  (COREG ) 25 MG tablet [id number] No Take 1 tablet (25 mg total) by mouth 2 (two) times daily with a meal. Glenn Lange, DO 08/27/2023 Morning Active Self, Pharmacy Records  divalproex  (DEPAKOTE  ER) 500 MG 24 hr tablet [id number] No Take 1 tablet (500 mg total) by mouth daily. Glenn Lange, DO 08/27/2023 Morning Active Self, Pharmacy Records  fluticasone  (FLONASE ) 50 MCG/ACT nasal  spray [id number] No Place 1 spray into both nostrils daily. Rayma Calandra, DO Taking Active Self, Pharmacy Records  gabapentin  (NEURONTIN ) 100 MG capsule [id number] No Please take 100 mg after dialysis on Tuesday, Thursday and Saturday Rayma Calandra, DO 08/27/2023 Morning Active Self, Pharmacy Records  hydrALAZINE  (APRESOLINE ) 100 MG tablet [id number] No Take 1 tablet (100 mg total) by mouth 3 (three) times daily. Glenn Lange, DO 08/27/2023 Morning Active Self, Pharmacy Records  hydrOXYzine  (ATARAX ) 10 MG tablet [id number] No Take 0.5 tablets (5 mg total) by mouth 2 (two) times daily as needed for itching. Glenn Lange, DO Unknown Active Self, Pharmacy Records  LOPERAMIDE  HCL PO [id number] No Take by mouth. [provider] 08/27/2023 Active Self, Pharmacy Records  Methoxy PEG-Epoetin Beta [facility] IJ) [id number] No Inject 1 Dose into the skin every 14 (fourteen) days. [provider] 08/27/2023 Active Self, Pharmacy Records  Methoxy PEG-Epoetin [facility] IJ) [id number] No Mircera [provider] 08/27/2023 Active Self, Pharmacy Records  minoxidil  (LONITEN ) 10 MG tablet [id number] No Take 1 tablet (10 mg total) by mouth 2 (two) times daily. Cody Das, MD 08/27/2023 Morning Active Self, Pharmacy Records  NIFEdipine  (ADALAT  CC) 60 MG 24 hr tablet [id number] No Take 1 tablet (60 mg total) by mouth daily. Rayma Calandra, DO 08/27/2023 Morning Active Self, Pharmacy Records  pantoprazole  (PROTONIX ) 40 MG tablet [id number] No Take 1 tablet (40 mg total) by mouth daily. Glenn Lange, DO 08/27/2023 Morning Active Self, Pharmacy Records           Med Note [facility], SUSAN A   Thu Aug 27, 2023  9:25 PM)    sevelamer  carbonate (RENVELA ) 800 MG tablet [id number] No Take 2 tablets (1,600 mg total) by  mouth 3 (three) times daily with meals. Rayma Calandra, DO 08/27/2023 Morning Active Self, Pharmacy Records  Med List Note (Card, Rosi Converse, CPhT 05/10/23 1218): Dialysis: Tuesday,  Thursday, and Saturday.            Home Care and Equipment/Supplies: Were Home Health Services Ordered?: NA Any new equipment or medical supplies ordered?: NA  Functional Questionnaire: Do you need assistance with bathing/showering or dressing?: No Do you need assistance with meal preparation?: No Do you need assistance with eating?: No Do you have difficulty maintaining continence: No Do you need assistance with getting out of bed/getting out of a chair/moving?: No Do you have difficulty managing or taking your medications?: No  Follow up appointments reviewed: PCP Follow-up appointment confirmed?: No (declined seeing specialist) MD Provider Line Number:[phone] Given: No Specialist Hospital Follow-up appointment confirmed?: No Edward Specialist Follow-Up Not Confirmed: Patient has Specialist Provider Number and will Call for Appointment Do you need transportation to your follow-up appointment?: No Do you understand care options if your condition(s) worsen?: Yes-patient verbalized understanding    SIGNATURE Darrall Ellison, LPN [facility] Nurse Health Advisor Direct Dial  [phone]

## 2023-09-02 NOTE — Transitions of Care (Post Inpatient/ED Visit) (Signed)
   09/02/2023  Name: [name] MRN: [id number] DOB: Nov 23, 1985  Today's TOC FU Call Status: Today's TOC FU Call Status:: Unsuccessful Call (2nd Attempt) Unsuccessful Call (1st Attempt) Date: 09/01/23 Unsuccessful Call (2nd Attempt) Date: 09/02/23  Attempted to reach the patient regarding the most recent Inpatient/ED visit.  Follow Up Plan: Additional outreach attempts will be made to reach the patient to complete the Transitions of Care (Post Inpatient/ED visit) call.   Signature Darrall Ellison, LPN [facility] Nurse Health Advisor Direct Dial  [phone]

## 2023-09-07 ENCOUNTER — Encounter: Payer: Self-pay | Admitting: Student

## 2023-09-07 ENCOUNTER — Telehealth: Payer: Self-pay | Admitting: Student

## 2023-09-07 ENCOUNTER — Other Ambulatory Visit: Payer: Self-pay | Admitting: Student

## 2023-09-07 MED ORDER — CARVEDILOL 25 MG PO TABS: 25.0000 mg | ORAL_TABLET | Freq: Two times a day (BID) | ORAL | 0 refills | Status: DC

## 2023-09-07 NOTE — Telephone Encounter (Signed)
 Patient came in stating that he needs a refill on his carvedilol . Asks for it to be sent in as soon as possible please, he is out and his bp is high

## 2023-09-15 NOTE — Progress Notes (Signed)
 This encounter was created in error - please disregard.  This encounter was created in error - please disregard.

## 2023-09-16 ENCOUNTER — Telehealth (INDEPENDENT_AMBULATORY_CARE_PROVIDER_SITE_OTHER): Payer: Self-pay | Admitting: Physician Assistant

## 2023-09-16 NOTE — Telephone Encounter (Signed)
 Patient called and was wondering about the results of the imaging - and possibly scheduling surgery.  Please advise.  [phone]

## 2023-09-17 NOTE — Telephone Encounter (Signed)
 We are still waiting on the results to be completed by radiology, I will reach out to him once they are complete. Thanks

## 2023-09-18 ENCOUNTER — Ambulatory Visit: Payer: Self-pay | Admitting: General Surgery

## 2023-09-18 NOTE — H&P (Signed)
 Chief Complaint: New Consultation ( Lipoma back of RT shoulder blade)       History of Present Illness: [name] is a 38 y.o. male who is seen today as an office consultation at the request of Dr. Rochelle Chu for evaluation of New Consultation ( Lipoma back of RT shoulder blade) .   History of Present Illness [name] is a 38 year old male on dialysis who presents with a mass on his shoulder.   The shoulder mass has been present for two to three years. It is described as having a 'fuzzy feeling' and causes irritation and throbbing pain. The mass is located on the same side as his catheter and has been large for some time, with others noticing its presence.   He undergoes dialysis on Tuesdays, Thursdays, and Saturdays.         Review of Systems: A complete review of systems was obtained from the patient.  I have reviewed this information and discussed as appropriate with the patient.  See HPI as well for other ROS.   Review of Systems  Constitutional:  Negative for fever.  HENT:  Negative for congestion.   Eyes:  Negative for blurred vision.  Respiratory:  Negative for cough, shortness of breath and wheezing.   Cardiovascular:  Negative for chest pain and palpitations.  Gastrointestinal:  Negative for heartburn.  Genitourinary:  Negative for dysuria.  Musculoskeletal:  Negative for myalgias.  Skin:  Negative for rash.  Neurological:  Negative for dizziness and headaches.  Psychiatric/Behavioral:  Negative for depression and suicidal ideas.   All other systems reviewed and are negative.       Medical History: Past Medical History Past Medical History: Diagnosis Date  Alport's syndrome (HHS-HCC)    Bipolar disorder (CMS/HHS-HCC)    CKD (chronic kidney disease), stage IV (CMS/HHS-HCC)    Hearing loss    Hypertension    Peripheral neuropathy        Problem List Patient Active Problem List Diagnosis  Bipolar disorder (CMS/HHS-HCC)  CKD (chronic kidney disease)  stage 5, GFR less than 15 ml/min (CMS/HHS-HCC)  Systemic hypertension  History of appendectomy  Obesity (BMI 30.0-34.9)  Tobacco use  GERD (gastroesophageal reflux disease)  Secondary hyperparathyroidism (CMS/HHS-HCC)  Tobacco abuse  Pre-transplant evaluation for kidney transplant  Alport syndrome (HHS-HCC)  Unspecified hypertensive kidney disease with chronic kidney disease stage I through stage IV, or unspecified(403.90)      Past Surgical History Past Surgical History: Procedure Laterality Date  APPENDECTOMY          Allergies No Known Allergies    Medications Ordered Prior to Encounter Current Outpatient Medications on File Prior to Visit Medication Sig Dispense Refill  acetaminophen  (TYLENOL ) 500 MG tablet Take by mouth as needed for Pain      calcium  carbonate (TUMS ORAL) Take by mouth as needed      COREG  25 mg tablet Take 1 tablet by mouth      divalproex  (DEPAKOTE  ER) 500 MG ER tablet Take 1 tablet by mouth      famotidine  (PEPCID ) 20 MG tablet Take 1 tablet by mouth      gabapentin  (NEURONTIN ) 100 MG capsule Take by mouth      hydrALAZINE  (APRESOLINE ) 100 MG tablet Take 1 tablet by mouth      minoxidiL  10 MG tablet Take by mouth      NIFEdipine  (ADALAT  CC) 60 MG ER tablet Take 1 tablet by mouth      nystatin  (MYCOSTATIN ) 100,000  unit/gram powder APPLY THE POWDER TO AFFEVTED AREA TWICE A DAY, APPLY OVER THE CREAM FIRST      pantoprazole  (PROTONIX ) 40 MG DR tablet Take 1 tablet by mouth      sevelamer  carbonate (RENVELA ) 800 mg tablet Take 2 tablets by mouth      amLODIPine  (NORVASC ) 5 MG tablet Take 5 mg by mouth once daily   6    No current facility-administered medications on file prior to visit.      Family History History reviewed. No pertinent family history.     Tobacco Use History Social History    Tobacco Use Smoking Status Never Smokeless Tobacco Never      Social History Social History    Socioeconomic History  Marital  status: Divorced Tobacco Use  Smoking status: Never  Smokeless tobacco: Never    Social Drivers of Health    Food Insecurity: No Food Insecurity (05/19/2023)   Received from [facility]   Hunger Vital Sign    Worried About Running Out of Food in the Last Year: Never true    Ran Out of Food in the Last Year: Never true Transportation Needs: No Transportation Needs (05/19/2023)   Received from [facility] - Transportation    Lack of Transportation (Medical): No    Lack of Transportation (Non-Medical): No Physical Activity: Inactive (06/02/2023)   Received from [facility]   Exercise Vital Sign    Days of Exercise per Week: 0 days    Minutes of Exercise per Session: 0 min Stress: Stress Concern Present (06/02/2023)   Received from [facility] - Occupational Stress Questionnaire    Feeling of Stress : Rather much   Received from [employer]   Social Network Housing Stability: High Risk (05/19/2023)   Received from [facility] Stability Vital Sign    Unable to Pay for Housing in the Last Year: No    Number of Times Moved in the Last Year: 1    Homeless in the Last Year: Yes      Objective:     Vitals:   09/18/23 1133 BP: (!) 193/103 Pulse: 95 Temp: 37 C (98.6 F) SpO2: 98% Weight: 86.3 kg (190 lb 3.2 oz) Height: 172.7 cm (5\' 8" ) PainSc:   3 PainLoc: Shoulder   Body mass index is 28.92 kg/m.   Physical Exam Constitutional:      General: He is not in acute distress.    Appearance: Normal appearance.  HENT:     Head: Normocephalic.     Nose: No rhinorrhea.     Mouth/Throat:     Mouth: Mucous membranes are moist.     Pharynx: Oropharynx is clear.  Eyes:     General: No scleral icterus.    Pupils: Pupils are equal, round, and reactive to light.  Cardiovascular:     Rate and Rhythm: Normal rate.     Pulses: Normal pulses.  Pulmonary:     Effort: Pulmonary effort is normal. No respiratory distress.      Breath sounds: No stridor. No wheezing.  Abdominal:     General: Abdomen is flat. There is no distension.     Tenderness: There is no abdominal tenderness. There is no guarding or rebound.  Musculoskeletal:        General: Normal range of motion.     Cervical back: Normal range of motion and neck supple.  Skin:    General: Skin is  warm and dry.     Capillary Refill: Capillary refill takes less than 2 seconds.     Coloration: Skin is not jaundiced.           Comments: 4cm SQ mass , mobile  Neurological:     General: No focal deficit present.     Mental Status: [name] and oriented to person, place, and time. Mental status is at baseline.  Psychiatric:        Mood and Affect: Mood normal.        Thought Content: Thought content normal.        Judgment: Judgment normal.          Assessment and Plan: Diagnoses and all orders for this visit:   Lipoma of back     [name] is a 38 y.o. male    Assessment & Plan Lipoma   A benign lipoma on his shoulder is causing irritation, and he desires removal. Schedule surgical removal under local/MAC anesthesia. We discussed the risks and benefits of the procedure to includebut not limited to: infection, bleeding, damage to surrounding structures.  Pt voiced understanding and wishes to proceed.   End-stage renal disease on dialysis   He is undergoing dialysis three times a week.             No follow-ups on file.   Shela Derby, MD, [facility], [facility]

## 2023-09-21 ENCOUNTER — Other Ambulatory Visit

## 2023-09-21 ENCOUNTER — Encounter: Payer: Self-pay | Admitting: Cardiology

## 2023-09-21 ENCOUNTER — Encounter: Payer: Self-pay | Admitting: Gastroenterology

## 2023-09-21 ENCOUNTER — Ambulatory Visit (INDEPENDENT_AMBULATORY_CARE_PROVIDER_SITE_OTHER): Admitting: Gastroenterology

## 2023-09-21 VITALS — BP 160/100 | HR 73 | Ht 68.0 in | Wt 187.0 lb

## 2023-09-21 DIAGNOSIS — K529 Noninfective gastroenteritis and colitis, unspecified: Secondary | ICD-10-CM | POA: Diagnosis not present

## 2023-09-21 DIAGNOSIS — L290 Pruritus ani: Secondary | ICD-10-CM | POA: Diagnosis not present

## 2023-09-21 DIAGNOSIS — R197 Diarrhea, unspecified: Secondary | ICD-10-CM

## 2023-09-21 NOTE — Progress Notes (Signed)
 09/21/2023 [name] [id number] Feb 08, 1986   HISTORY OF PRESENT ILLNESS: This is a 38 year old male who is a patient of Dr. Revonda Castles.  He is here today with ongoing complaints of diarrhea.  He says that sometimes he will have diarrhea several times a day.  Has a lot of nocturnal stools, sometimes up several times in the night with diarrhea.  Has had accidental diarrhea bowel movements while sleeping.  Describes a lot of gas with explosive stools.  Says that his symptoms have been going on for a couple years, but it has gotten worse.  Says that foods "run right through me".  He has end-stage renal disease on dialysis, has been in the hospital several times, with few surgeries due to issues with his AV fistula, etc.  He also describes perianal itching as well.  Has intermittent rectal bleeding.  Has had stool studies ordered before for pancreatic fecal elastase, etc. but he has not performed those.  Colonoscopy 10/2021:  - The examined portion of the ileum was normal. - Diverticulosis in the sigmoid colon. - Normal mucosa in the entire examined colon. Biopsied. - The examination was otherwise normal.  A. COLON, RANDOM, BIOPSY:  Fragments of large bowel mucosa with no significant inflammation,  chronic crypt architectural changes, dysplasia or malignancy seen in the  current specimen.  No increase in intraepithelial lymphocytes or thickening of basement  membrane is noted.  Clinical and endoscopic correlation is required.   Flex sig 04/2022, internal and external hemorrhoids.   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   Heart murmur    Hypertension    Low blood sugar     Marijuana abuse    Noncompliance    Pericardial effusion 03/28/2023   Tobacco abuse    Past Surgical History:  Procedure Laterality Date   A/V FISTULAGRAM Right 10/27/2022   Procedure: A/V Fistulagram;  Surgeon: Dannis Dy, MD;  Location: [facility] INVASIVE CV LAB;  Service: Cardiovascular;  Laterality: Right;   APPENDECTOMY     AV FISTULA PLACEMENT Left 03/03/2019   Procedure: ARTERIOVENOUS (AV) FISTULA CREATION LEFT ARM;  Surgeon: Adine Hoof, MD;  Location: [facility] OR;  Service: Vascular;  Laterality: Left;   BASCILIC VEIN TRANSPOSITION Right 04/12/2019   Procedure: RIGHT UPPER EXTREMITY BASCILIC VEIN TRANSPOSITION FIRST STAGE FISTULA;  Surgeon: Dannis Dy, MD;  Location: [facility] OR;  Service: Vascular;  Laterality: Right;   BIOPSY  10/14/2021   Procedure: BIOPSY;  Surgeon: Albertina Hugger, MD;  Location: [facility] ENDOSCOPY;  Service: Gastroenterology;;   BIOPSY  06/06/2022   Procedure: BIOPSY;  Surgeon: Sergio Dandy, MD;  Location: [facility] ENDOSCOPY;  Service: Gastroenterology;;   BIOPSY  11/12/2022   Procedure: BIOPSY;  Surgeon: Normie Becton., MD;  Location: [facility] ENDOSCOPY;  Service: Gastroenterology;;   BIOPSY  12/05/2022   Procedure: BIOPSY;  Surgeon: Elois Hair, MD;  Location: [facility] ENDOSCOPY;  Service: Gastroenterology;;   COLONOSCOPY WITH PROPOFOL  N/A 10/14/2021   Procedure: COLONOSCOPY WITH PROPOFOL ;  Surgeon: Albertina Hugger, MD;  Location: [facility] ENDOSCOPY;  Service: Gastroenterology;  Laterality: N/A;   DIALYSIS/PERMA CATHETER INSERTION N/A 04/03/2023   Procedure: DIALYSIS/PERMA CATHETER INSERTION;  Surgeon: Carlene Che, MD;  Location: [facility] INVASIVE CV LAB;  Service: Cardiovascular;  Laterality: N/A;   ESOPHAGOGASTRODUODENOSCOPY N/A 11/12/2022   Procedure: ESOPHAGOGASTRODUODENOSCOPY (EGD);  Surgeon: Normie Becton., MD;  Location: [facility] ENDOSCOPY;  Service: Gastroenterology;  Laterality: N/A;   ESOPHAGOGASTRODUODENOSCOPY N/A 12/05/2022    Procedure: ESOPHAGOGASTRODUODENOSCOPY (EGD);  Surgeon: Elois Hair, MD;  Location: [facility] ENDOSCOPY;  Service: Gastroenterology;  Laterality: N/A;   ESOPHAGOGASTRODUODENOSCOPY (EGD) WITH PROPOFOL  N/A 06/06/2022   Procedure: ESOPHAGOGASTRODUODENOSCOPY (EGD) WITH PROPOFOL ;  Surgeon: Sergio Dandy, MD;  Location: [facility] ENDOSCOPY;  Service: Gastroenterology;  Laterality: N/A;   FISTULA SUPERFICIALIZATION Right 11/07/2022   Procedure: PLICATION OF LARGE ANEURYSMS OF RIGHT ARTERIOVENOUS FISTULA WITH INSERTION OF 7mm INTERPOSITIONAL GORTEX GRAFT;  Surgeon: Dannis Dy, MD;  Location: [facility] OR;  Service: Vascular;  Laterality: Right;   FLEXIBLE SIGMOIDOSCOPY N/A 04/14/2022   Procedure: FLEXIBLE SIGMOIDOSCOPY;  Surgeon: Sergio Dandy, MD;  Location: [facility] ENDOSCOPY;  Service: Gastroenterology;  Laterality: N/A;   INSERTION OF DIALYSIS CATHETER N/A 11/07/2022   Procedure: INSERTION OF RIGHT INTERNAL JUGULAR TUNNELED DIALYSIS CATHETER;  Surgeon: Dannis Dy, MD;  Location: [facility] OR;  Service: Vascular;  Laterality: N/A;   LIGATION OF ARTERIOVENOUS  FISTULA Left 03/06/2019   Procedure: LIGATION OF ARTERIOVENOUS  FISTULA;  Surgeon: Young Hensen, MD;  Location: [facility] OR;  Service: Vascular;  Laterality: Left;   PERIPHERAL VASCULAR BALLOON ANGIOPLASTY  10/27/2022   Procedure: PERIPHERAL VASCULAR BALLOON ANGIOPLASTY;  Surgeon: Dannis Dy, MD;  Location: [facility] INVASIVE CV LAB;  Service: Cardiovascular;;  rt upper arm fistula   SAVORY DILATION N/A 11/12/2022   Procedure: SAVORY DILATION;  Surgeon: Brice Campi Albino Alu., MD;  Location: [facility] ENDOSCOPY;  Service: Gastroenterology;  Laterality: N/A;   spinal tap     SPINE SURGERY     related to a spinal infection, unsure of surgery or infection source   TRANSESOPHAGEAL ECHOCARDIOGRAM (CATH LAB) N/A 04/02/2023   Procedure: TRANSESOPHAGEAL ECHOCARDIOGRAM;  Surgeon: Hugh Madura, MD;  Location: [facility] INVASIVE CV LAB;  Service:  Cardiovascular;  Laterality: N/A;   WISDOM TOOTH EXTRACTION      reports that he has been smoking cigarettes. He has never used smokeless tobacco. He reports that he does not currently use alcohol. He reports current drug use. Drug: Marijuana. family history includes Asthma in his son; Diabetes in his mother; Hearing loss in his father; Heart disease in his maternal grandmother; Hypertension in his mother and sister; Kidney failure in his mother; Stroke in his maternal grandfather. Allergies  Allergen Reactions   Zestril [Lisinopril] Swelling   Nsaids Other (See Comments)    "Kidney problems "   Risperdal  [Risperidone ] Other (See Comments)    Unknown reaction       Outpatient Encounter Medications as of 09/21/2023  Medication Sig   adapalene  (ADAPALENE  TREATMENT) 0.1 % gel Apply topically at bedtime.   calcium  acetate (PHOSLO ) 667 MG capsule Take 1 capsule (667 mg total) by mouth 3 (three) times daily with meals.   carvedilol  (COREG ) 25 MG tablet Take 1 tablet (25 mg total) by mouth 2 (two) times daily with a meal.   divalproex  (DEPAKOTE  ER) 500 MG 24 hr tablet Take 1 tablet (500 mg total) by mouth daily.   fluticasone  (FLONASE ) 50 MCG/ACT nasal spray Place 1 spray into both nostrils daily.   gabapentin  (NEURONTIN ) 100 MG capsule Please take 100 mg  after dialysis on Tuesday, Thursday and Saturday   hydrALAZINE  (APRESOLINE ) 100 MG tablet Take 1 tablet (100 mg total) by mouth 3 (three) times daily.   hydrOXYzine  (ATARAX ) 10 MG tablet Take 0.5 tablets (5 mg total) by mouth 2 (two) times daily as needed for itching.   LOPERAMIDE  HCL PO Take by mouth.   Methoxy PEG-Epoetin Beta (MIRCERA IJ) Inject 1 Dose into the skin every 14 (fourteen) days.   Methoxy PEG-Epoetin Beta (MIRCERA IJ) Mircera   minoxidil  (LONITEN ) 10 MG tablet Take 1 tablet (10 mg total) by mouth 2 (two) times daily.   NIFEdipine  (ADALAT  CC) 60 MG 24 hr tablet Take 1 tablet (60 mg total) by mouth daily.   pantoprazole   (PROTONIX ) 40 MG tablet Take 1 tablet (40 mg total) by mouth daily.   sevelamer  carbonate (RENVELA ) 800 MG tablet Take 2 tablets (1,600 mg total) by mouth 3 (three) times daily with meals.   No facility-administered encounter medications on file as of 09/21/2023.    REVIEW OF SYSTEMS  : All other systems reviewed and negative except where noted in the History of Present Illness.   PHYSICAL EXAM: BP (!) 160/100   Pulse 73   Ht 5\' 8"  (1.727 m)   Wt 187 lb (84.8 kg)   BMI 28.43 kg/m  General: Well developed AA male in no acute distress Head: Normocephalic and atraumatic Eyes:  Sclerae anicteric, conjunctiva pink. Ears: HOH, has hearing aids. Musculoskeletal: Symmetrical with no gross deformities  Skin: No lesions on visible extremities Neurological: Alert oriented x 4, grossly non-focal Psychological:  Alert and cooperative. Normal mood and affect  ASSESSMENT AND PLAN: *Chronic diarrhea: Previous colonoscopy and flex sig unrevealing with normal random biopsies.  Thought to be due to IBS-D, but he describes a lot of nocturnal stools.  Will check a pancreatic fecal elastase and a stool for C. difficile.  He has been in and out of the hospital several times.  If those are negative question if we could consider Viberzi.  He is a dialysis patient so probably need to check with nephrology. *Perianal itching: Likely irritation from wiping with his diarrhea.  Has internal and external hemorrhoids as well.  Will prescribe hydrocortisone  cream for him to use.  Prescription sent to pharmacy.  Can alternate that with OTC Desitin or zinc oxide.   CC:  Glenn Lange, DO

## 2023-09-21 NOTE — Addendum Note (Signed)
 Addended by: Ceferino Lang D on: 09/21/2023 01:05 PM   Modules accepted: Level of Service

## 2023-09-21 NOTE — Patient Instructions (Addendum)
 Your provider has requested that you go to the basement level for lab work before leaving today. Press "B" on the elevator. The lab is located at the first door on the left as you exit the elevator.  We have sent the following medications to your pharmacy for you to pick up at your convenience: Hydrocortisone  cream - apply to rectal area three times daily as needed.   Use Destin as needed.   _______________________________________________________  If your blood pressure at your visit was 140/90 or greater, please contact your primary care physician to follow up on this.  _______________________________________________________  If you are age 36 or older, your body mass index should be between 23-30. Your Body mass index is 28.43 kg/m. If this is out of the aforementioned range listed, please consider follow up with your Primary Care Provider.  If you are age 44 or younger, your body mass index should be between 19-25. Your Body mass index is 28.43 kg/m. If this is out of the aformentioned range listed, please consider follow up with your Primary Care Provider.   ________________________________________________________  The [facility] providers would like to encourage you to use MYCHART to communicate with providers for non-urgent requests or questions.  Due to long hold times on the telephone, sending your provider a message by [facility] may be a faster and more efficient way to get a response.  Please allow 48 business hours for a response.  Please remember that this is for non-urgent requests.  _______________________________________________________

## 2023-09-25 ENCOUNTER — Ambulatory Visit: Admitting: Student

## 2023-09-25 VITALS — BP 174/127 | HR 89 | Wt 185.8 lb

## 2023-09-25 DIAGNOSIS — I10 Essential (primary) hypertension: Secondary | ICD-10-CM | POA: Diagnosis present

## 2023-09-25 MED ORDER — NIFEDIPINE ER 60 MG PO TB24: 60.0000 mg | ORAL_TABLET | Freq: Every day | ORAL | 0 refills | Status: DC

## 2023-09-25 NOTE — Progress Notes (Deleted)
   SUBJECTIVE:   CHIEF COMPLAINT / HPI:   ***  PERTINENT  PMH / PSH: ***  OBJECTIVE:   There were no vitals taken for this visit. ***  General: NAD, pleasant, able to participate in exam Cardiac: RRR, no murmurs. Respiratory: CTAB, normal effort, No wheezes, rales or rhonchi Abdomen: Bowel sounds present, nontender, nondistended, no hepatosplenomegaly. Extremities: no edema or cyanosis. Skin: warm and dry, no rashes noted Neuro: alert, no obvious focal deficits Psych: Normal affect and mood  ASSESSMENT/PLAN:   No problem-specific Assessment & Plan notes found for this encounter.     Dr. [name], DO [place] [facility]    {    This will disappear when note is signed, click to select method of visit    :1}

## 2023-09-25 NOTE — Assessment & Plan Note (Signed)
 BP: (!) 174/127 today. Poorly controlled suspect related to medication nonadherence. Goal of <130/80. Medication regimen: Refilled nifedipine  for 57-month supply, continue to take with Coreg  and hydralazine .  Discussed case with PCP Dr. Rochelle Chu, this is his typical chest pain which is not active currently.  Do not suspect ACS at this time.  Educated on ED precautions.

## 2023-09-25 NOTE — Patient Instructions (Addendum)
 It was great to see you today! Thank you for choosing [facility] for your primary care.  Today we addressed: I have refilled your nifedipine  for 85-month supply.  In the future please contact the pharmacy or your nephrologist if you run out of your medication. Please provide a stool sample to your GI doctor as requested. Should you develop chest pain with shortness of breath that radiates to one of your arms that does not improve after 5 minutes, please proceed to the ED.  If you haven't already, sign up for My Chart to have easy access to your labs results, and communication with your primary care physician.  Return if symptoms worsen or fail to improve. Please arrive 15 minutes before your appointment to ensure smooth check in process.  We appreciate your efforts in making this happen.  Thank you for allowing me to participate in your care, Veronia Goon, DO 09/25/2023, 2:39 PM PGY-3, [facility]

## 2023-09-25 NOTE — Progress Notes (Signed)
  SUBJECTIVE:   CHIEF COMPLAINT / HPI:   Presents today wanting to discuss elevated blood pressures with SBP 200-10 at home and states it fluctuates during dialysis.  He takes his carvedilol  and hydralazine  but has been out of his nifedipine  for about a week.  He states his dialysis sessions have been shortened due to staffing issues.  He frequently gets tight body pain which affects his hands which is intermittent and has improved with sleep.  He endorses intermittent chest pain with sometimes arm pain that was approximately 2 days ago.  This comes and goes, he has not had this pain for a day.  PERTINENT  PMH / PSH: Alport syndrome, ESRD on HD, secondary hyperparathyroidism, HTN, bipolar 1 disorder, anemia of chronic disease  OBJECTIVE:  BP (!) 174/127   Pulse 89   Wt 185 lb 12.8 oz (84.3 kg)   SpO2 99%   BMI 28.25 kg/m  General: Well-appearing, NAD HEENT: PERRL, EOMI, no focal neurological deficits appreciable, conversationally appropriate CV: RRR, no murmurs appreciable Pulm: Normal WOB  ASSESSMENT/PLAN:   Assessment & Plan Primary hypertension BP: (!) 174/127 today. Poorly controlled suspect related to medication nonadherence. Goal of <130/80. Medication regimen: Refilled nifedipine  for 4-month supply, continue to take with Coreg  and hydralazine .  Discussed case with PCP Dr. Rochelle Chu, this is his typical chest pain which is not active currently.  Do not suspect ACS at this time.  Educated on ED precautions.  Additionally, advised continuing workup with GI regarding his diarrhea and providing stool sample as requested by them.  Return if symptoms worsen or fail to improve. Veronia Goon, DO 09/25/2023, 2:43 PM PGY-3, [facility]

## 2023-09-25 NOTE — Progress Notes (Signed)
 ____________________________________________________________  Attending physician addendum:  Thank you for sending this case to me. I have reviewed the entire note and generally agree with your plan, however  I would try him on Imodium , dicyclomine  or Lomotil before considering Viberzi.  Lorella Roles, MD  ____________________________________________________________

## 2023-09-28 ENCOUNTER — Telehealth (INDEPENDENT_AMBULATORY_CARE_PROVIDER_SITE_OTHER): Payer: Self-pay | Admitting: Otolaryngology

## 2023-09-28 NOTE — Telephone Encounter (Signed)
 LVM for patient to call back to schedule follow-up appointment with Dr. Lydia Sams.

## 2023-09-30 ENCOUNTER — Other Ambulatory Visit: Payer: Self-pay | Admitting: Student

## 2023-10-08 ENCOUNTER — Ambulatory Visit (INDEPENDENT_AMBULATORY_CARE_PROVIDER_SITE_OTHER): Admitting: Otolaryngology

## 2023-10-16 ENCOUNTER — Ambulatory Visit (INDEPENDENT_AMBULATORY_CARE_PROVIDER_SITE_OTHER): Admitting: Otolaryngology

## 2023-10-16 ENCOUNTER — Encounter (INDEPENDENT_AMBULATORY_CARE_PROVIDER_SITE_OTHER): Payer: Self-pay | Admitting: Otolaryngology

## 2023-10-16 VITALS — BP 145/77 | HR 92 | Ht 68.0 in

## 2023-10-16 DIAGNOSIS — S00411A Abrasion of right ear, initial encounter: Secondary | ICD-10-CM

## 2023-10-16 DIAGNOSIS — Q8781 Alport syndrome: Secondary | ICD-10-CM

## 2023-10-16 DIAGNOSIS — S00412A Abrasion of left ear, initial encounter: Secondary | ICD-10-CM

## 2023-10-16 DIAGNOSIS — H903 Sensorineural hearing loss, bilateral: Secondary | ICD-10-CM

## 2023-10-16 MED ORDER — OFLOXACIN 0.3 % OT SOLN: 4.0000 [drp] | Freq: Two times a day (BID) | OTIC | 1 refills | Status: AC

## 2023-10-16 NOTE — Progress Notes (Signed)
 Dear Dr. Joshua, Here is my assessment for our mutual patient, [name]. Thank you for allowing me the opportunity to care for your patient. Please do not hesitate to contact me should you have any other questions. Sincerely, Dr. Eldora Blanch  Otolaryngology Clinic Note Referring provider: Dr. Joshua HPI:  [name] is a 38 y.o. male kindly referred by Dr. 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 Currently on dialysis. He has had hearing aids for several years and wore them until 20, at which point he stopped until 2019 with audio in 2019 showing severe to profound b/l SNHL with WRT 48%. He was lost to follow up and has been managing his HA at [facility]. Noted to have decreased hearing at one of his prior hospitalizations (unclear which one) but denies sudden HL. MRI was done in Jan 2025 showing b/l mastoid and right ME effusion with thornwald cyst. Edward obtained a HT and did an endo which was consistent with midline NP cyst and given degree of HL referred to me.  --------------------------------------------------------- 10/16/2023 Seen in follow up. Continues to report hearing loss bilaterally but denies ear pain, purulent drainage, vertigo. He reports that he scratched his ear recently due to itching and noted b/l bleeding. Still wearing hearing aids Patient otherwise denies: - odynophagia, unintentional weight loss, changes in voice, neck masses or other sinonasal symptoms such as pain/pressure. Ear surgery: denies  Personal or FHx of bleeding dz or anesthesia difficulty: no  PMHx: Alport syndrome, ESRD on Dialysis  Independent Review of Additional Tests or Records:  CT Temporal bones 09/01/2023 independently reviewed by me showing: concern for b/l otospongiosis, otic capsule intact; no bilateral mastoid opacification. 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, not significant changed in CT in 2024 BUN/Cr 08/30/2023: 99/17.89 Audiogram 07/2023 was independently reviewed and interpreted by me and it reveals A/A tymps, noted severe to profound SNHL 125-8K Hz (some tones show ABG but CN masks; WRT AD 48% and AS 28% at 110 dB   SNHL= Sensorineural hearing loss   PMH/Meds/All/SocHx/FamHx/ROS:   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   Heart murmur    Hypertension    Low blood sugar    Marijuana abuse    Noncompliance    Pericardial effusion 03/28/2023   Tobacco abuse      Past Surgical History:  Procedure Laterality Date   A/V FISTULAGRAM Right 10/27/2022   Procedure: A/V Fistulagram;  Surgeon: Eliza Lonni RAMAN, MD;  Location: [facility] INVASIVE CV LAB;  Service: Cardiovascular;  Laterality: Right;   APPENDECTOMY     AV FISTULA PLACEMENT Left 03/03/2019   Procedure: ARTERIOVENOUS (AV) FISTULA CREATION LEFT ARM;  Surgeon: Sheree Penne Lonni, MD;  Location: [facility] OR;  Service: Vascular;  Laterality: Left;   BASCILIC VEIN TRANSPOSITION Right 04/12/2019   Procedure: RIGHT UPPER EXTREMITY BASCILIC VEIN TRANSPOSITION FIRST STAGE FISTULA;  Surgeon: Eliza Lonni RAMAN, MD;  Location: [facility] OR;  Service: Vascular;  Laterality: Right;   BIOPSY  10/14/2021   Procedure: BIOPSY;  Surgeon: Legrand Victory LITTIE DOUGLAS, MD;  Location: [facility] ENDOSCOPY;  Service: Gastroenterology;;   BIOPSY  06/06/2022   Procedure: BIOPSY;  Surgeon: Shila Gustav GAILS, MD;  Location: [facility] ENDOSCOPY;  Service: Gastroenterology;;   BIOPSY  11/12/2022   Procedure: BIOPSY;  Surgeon: Wilhelmenia Aloha Raddle., MD;  Location: [facility] ENDOSCOPY;  Service:  Gastroenterology;;   BIOPSY  12/05/2022   Procedure: BIOPSY;  Surgeon: Stacia Glendia BRAVO, MD;  Location: [facility] ENDOSCOPY;  Service: Gastroenterology;;   COLONOSCOPY WITH PROPOFOL  N/A 10/14/2021   Procedure: COLONOSCOPY WITH PROPOFOL ;  Surgeon: Legrand Victory LITTIE DOUGLAS, MD;  Location: [facility] ENDOSCOPY;  Service: Gastroenterology;  Laterality: N/A;   DIALYSIS/PERMA CATHETER INSERTION N/A 04/03/2023   Procedure: DIALYSIS/PERMA CATHETER INSERTION;  Surgeon: Magda Debby SAILOR, MD;  [facility]: [facility] INVASIVE CV LAB;  Service: Cardiovascular;  Laterality: N/A;   ESOPHAGOGASTRODUODENOSCOPY N/A 11/12/2022   Procedure: ESOPHAGOGASTRODUODENOSCOPY (EGD);  Surgeon: Wilhelmenia Aloha Raddle., MD;  Location: [facility] ENDOSCOPY;  Service: Gastroenterology;  Laterality: N/A;   ESOPHAGOGASTRODUODENOSCOPY N/A 12/05/2022   Procedure: ESOPHAGOGASTRODUODENOSCOPY (EGD);  Surgeon: Stacia Glendia BRAVO, MD;  Location: [facility] ENDOSCOPY;  Service: Gastroenterology;  Laterality: N/A;   ESOPHAGOGASTRODUODENOSCOPY (EGD) WITH PROPOFOL  N/A 06/06/2022   Procedure: ESOPHAGOGASTRODUODENOSCOPY (EGD) WITH PROPOFOL ;  Surgeon: Shila Gustav GAILS, MD;  [facility] ENDOSCOPY;  Service: Gastroenterology;  Laterality: N/A;   FISTULA SUPERFICIALIZATION Right 11/07/2022   Procedure: PLICATION OF LARGE ANEURYSMS OF RIGHT ARTERIOVENOUS FISTULA WITH INSERTION OF 7mm INTERPOSITIONAL GORTEX GRAFT;  Surgeon: Eliza Lonni RAMAN, MD;  Location: [facility] OR;  Service: Vascular;  Laterality: Right;   FLEXIBLE SIGMOIDOSCOPY N/A 04/14/2022   Procedure: FLEXIBLE SIGMOIDOSCOPY;  Surgeon: Shila Gustav GAILS, MD;  [facility] ENDOSCOPY;  Service: Gastroenterology;  Laterality: N/A;   INSERTION OF DIALYSIS CATHETER N/A 11/07/2022   Procedure: INSERTION OF RIGHT INTERNAL JUGULAR TUNNELED DIALYSIS CATHETER;  Surgeon: Eliza Lonni RAMAN, MD;  Location: [facility] OR;  Service: Vascular;  Laterality: N/A;   LIGATION OF ARTERIOVENOUS  FISTULA Left 03/06/2019   Procedure: LIGATION OF ARTERIOVENOUS   FISTULA;  Surgeon: Gretta Lonni PARAS, MD;  Location: [facility] OR;  Service: Vascular;  Laterality: Left;   PERIPHERAL VASCULAR BALLOON ANGIOPLASTY  10/27/2022   Procedure: PERIPHERAL VASCULAR BALLOON ANGIOPLASTY;  Surgeon: Eliza Lonni RAMAN, MD;  Location: [facility] INVASIVE CV LAB;  Service: Cardiovascular;;  rt upper arm fistula   SAVORY DILATION N/A 11/12/2022   Procedure: SAVORY DILATION;  Surgeon: Wilhelmenia Aloha Raddle., MD;  Location: [facility] ENDOSCOPY;  Service: Gastroenterology;  Laterality: N/A;   spinal tap     SPINE SURGERY     related to a spinal infection, unsure of surgery or infection source   TRANSESOPHAGEAL ECHOCARDIOGRAM (CATH LAB) N/A 04/02/2023   Procedure: TRANSESOPHAGEAL ECHOCARDIOGRAM;  Surgeon: Jeffrie Oneil BROCKS, MD;  [facility] INVASIVE CV LAB;  Service: Cardiovascular;  Laterality: N/A;   WISDOM TOOTH EXTRACTION      Family History  Problem Relation Age of Onset   Hypertension Mother    Kidney failure Mother    Diabetes Mother    Hearing loss Father    Hypertension Sister    Heart disease Maternal Grandmother    Stroke Maternal Grandfather    Asthma Son      Social Connections: Unknown (09/20/2021)   Received from [facility]   Social Network    Social Network: Not on file      Current Outpatient Medications:    adapalene  (ADAPALENE  TREATMENT) 0.1 % gel, Apply topically at bedtime. (  Patient not taking: Reported on 10/22/2023), Disp: 45 g, Rfl: 0   carvedilol  (COREG ) 25 MG tablet, TAKE 1 TABLET (25 MG TOTAL) BY MOUTH TWICE A DAY WITH MEALS, Disp: 180 tablet, Rfl: 1   Cinacalcet HCl (SENSIPAR PO), Take 30 mg by mouth., Disp: , Rfl:    divalproex  (DEPAKOTE  ER) 500 MG 24 hr tablet, Take 1 tablet (500 mg total) by mouth daily., Disp: 30 tablet, Rfl: 0   fluticasone  (FLONASE ) 50 MCG/ACT nasal spray, Place 1 spray into both nostrils daily. (Patient not taking: Reported on 10/22/2023), Disp: 16 g, Rfl: 2   gabapentin  (NEURONTIN ) 100 MG capsule, Please take 100 mg after  dialysis on Tuesday, Thursday and Saturday, Disp: 30 capsule, Rfl: 0   hydrALAZINE  (APRESOLINE ) 100 MG tablet, Take 1 tablet (100 mg total) by mouth 3 (three) times daily., Disp: 90 tablet, Rfl: 0   hydrOXYzine  (ATARAX ) 10 MG tablet, Take 0.5 tablets (5 mg total) by mouth 2 (two) times daily as needed for itching., Disp: 30 tablet, Rfl: 0   Methoxy PEG-Epoetin Beta (MIRCERA IJ), Inject 1 Dose into the skin every 14 (fourteen) days., Disp: , Rfl:    Methoxy PEG-Epoetin Beta (MIRCERA IJ), Mircera, Disp: , Rfl:    minoxidil  (LONITEN ) 10 MG tablet, Take 1 tablet (10 mg total) by mouth 2 (two) times daily., Disp: 180 tablet, Rfl: 1   NIFEdipine  (ADALAT  CC) 60 MG 24 hr tablet, Take 1 tablet (60 mg total) by mouth daily., Disp: 90 tablet, Rfl: 0   pantoprazole  (PROTONIX ) 40 MG tablet, Take 1 tablet (40 mg total) by mouth daily., Disp: 30 tablet, Rfl: 0   sevelamer  carbonate (RENVELA ) 800 MG tablet, Take 2 tablets (1,600 mg total) by mouth 3 (three) times daily with meals., Disp: 180 tablet, Rfl: 0   Physical Exam:   BP (!) 145/77 (BP Location: Left Arm, Patient Position: Sitting, Cuff Size: Large)   Pulse 92   Ht 5' 8 (1.727 m)   SpO2 94%   BMI 28.25 kg/m   Salient findings:  CN II-XII intact Given history and complaints, ear microscopy was indicated and performed for evaluation with findings as below in physical exam section and in procedures; Bilateral EAC clear and TM intact with well pneumatized middle ear spaces; small b/l excoriations in bony EAC b/l Weber 512: UTA Anterior rhinoscopy: Septum intact; bilateral inferior turbinates without significant hypertrophy No respiratory distress or stridor  Seprately Identifiable Procedures:  Prior to initiating any procedures, risks/benefits/alternatives were explained to the patient and verbal consent obtained. Procedure: Bilateral ear microscopy using microscope (CPT [phone]) Pre-procedure diagnosis: bilateral profound hearing loss, bilateral ear  bleeding Post-procedure diagnosis: same Indication: see above; given patient's otologic complaints and history, for improved and comprehensive examination of external ear and tympanic membrane, bilateral otologic examination using microscope was performed. Prior to proceeding, verbal consent was obtained after discussion of R/B/A  Procedure: Patient was placed semi-recumbent. Both ear canals were examined using the microscope with findings above. Patient tolerated the procedure well.   Impression & Plans:  [name] is a 38 y.o. male with:  1. Excoriation of right ear canal, initial encounter   2. Excoriation of left ear canal, initial encounter   3. Alport syndrome   4. Sensorineural hearing loss (SNHL) of both ears    Noted progressive SNHL, most likely from Alport. We had an extensive discussion re: options including continued amplification v/s CI. We discussed rehab and expectations for CI and statistics regarding responsiveness. He is not interested in  CI currently and prefers amplification. Stressed continued amplification For ear canal excoriation, rec ofloxacin  BID x7d F/u in 1 year with audio for ongoing monitoring    See below regarding exact medications prescribed this encounter including dosages and route: Meds ordered this encounter  Medications   ofloxacin  (FLOXIN ) 0.3 % OTIC solution    Sig: Place 4 drops into both ears 2 (two) times daily for 7 days.    Dispense:  10 mL    Refill:  1      Thank you for allowing me the opportunity to care for your patient. Please do not hesitate to contact me should you have any other questions.  Sincerely, Eldora Blanch, MD Otolaryngologist (ENT), [facility] Phone: [phone] Fax: [phone]  11/09/2023, 3:04 PM   MDM:  Level 4 - 99214 Complexity/Problems addressed: mod - new problem, chronic worsening problem Data complexity: mod - independent imaging interpretation - Morbidity: mod  -  Prescription Drug prescribed or managed: y

## 2023-10-20 ENCOUNTER — Encounter: Payer: Self-pay | Admitting: *Deleted

## 2023-10-22 NOTE — Progress Notes (Signed)
 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 Right internal jugular TDC 03/14/23. S/p creation on left brachiobasilic AVF on 10/21/23 at [facility].   History includes smoking, HTN, Alport syndrome (with hearing loss and ESRD), CHF (EF 40-45% 02/2022 likely from uncontrolled HTN; LVEF 55-60% 04/2023), murmur (mild MR 04/2023), pericardial effusion (small to moderate since 02/2022), anemia, GERD, bipolar 1 disorder, non-compliance, spinal surgery. HD TTS.  Last cardiology visit was on 07/29/23 with Dr. Filiberto Hug for follow-up HTN and pericardial effusion. He had small-moderate size pericardial effusion on echocardiograms dating back to 02/2022, and has been described as moderate on TTE/TEE done between 03/27/23 - 05/10/23. Cardiology felt likely related to ESRD, and in the absence of symptoms or clinical signs of compression did not refer for pericardiocentesis. His BP remained uncontrolled. He refilled minoxidil , but recommended HTN management per PCP/nephrology. Limited echo planned (still pending), but otherwise discussed as needed cardiology follow-up.   Antihypertensive medications currently include Coreg  25 mg BID, hydralazine  100 mg TID, minoxidil  10 mg BID, nifedipine  60 mg daily  Last TTE on 05/10/23 showed LVEF 55-60%, no RWMA, severe concentric LVH, normal RV systolic function, mildly elevated PASP, moderate circumferential pericardial effusion (stable), mild MR.  He is a [name]. Surgeon anticipated local/MAC anesthesia. Patient underwent anesthesia recently on 10/21/23 for AVF creation. He has known chronic pericardial effusion and  poorly controlled HTN (compliance may be contributing). Anesthesia team to evaluate on the day of surgery.    VS:  Wt Readings from Last 3 Encounters:  09/25/23 84.3 kg  09/21/23 84.8 kg  08/30/23 79.4 kg   BP Readings from Last 3 Encounters:  10/16/23 (!) 145/77  09/25/23 (!) 174/127  09/21/23 (!) 160/100   Pulse Readings from Last 3 Encounters:  10/16/23 92  09/25/23 89  09/21/23 73     PROVIDERS: Glenn Lange, DO is PCP  Fransico Ivy, MD is cardiologist. Anticipated as needed follow-up pending next limited echo (ordered 07/29/23). Milon Aloe, MD is ENT   LABS: For day of surgery. Per Care Everywhere/[facility]: K 4.8 and glucose 84 on 10/21/23. H/H 8.7/26.1 on 10/15/23.   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 - Extensive bilateral mastoid opacification. Cysts affect the nasopharynx.   MRI Neck 09/01/23: IMPRESSION: - Limited study due to degree of motion. - Narrowing of the nasopharynx due to clustered submucosal cysts that are nonprogressive since CT in 2024, usually retention cyst. Bilateral mastoid opacification is presumably related.   CXR 08/30/23: IMPRESSION: 1. Global moderate to severe enlargement of the cardiopericardial silhouette, prior CTA demonstrating a moderate-sized pericardial effusion with mild-to-moderate true cardiomegaly. 2. Central vascular prominence is again noted but less than previously. 3. Slight central interstitial edema. No substantial pleural effusion. 4. Right IJ dialysis catheter terminating about the superior cavoatrial junction, stable in position.  MRI Brain 08/27/23: IMPRESSION: 1. No evidence of acute intracranial abnormality. 2. Unchanged mild, age advanced T2 hyperintensities in the white matter. Differential considerations include accelerated chronic microvascular ischemic disease or demyelination. 3. Bilateral mastoid effusions.    EKG:  08/31/23: Sinus rhythm Anterior  infarct, old no stemi appears similar to prior Confirmed by Russella Courts ([id number]) on 08/31/2023 9:10:48 AM - Lateral T wave changes more prominent when compared to prior tracing.    CV: TTE (Limited) 05/10/23: IMPRESSIONS   1. Left ventricular ejection fraction, by estimation, is 55 to 60%. The  left ventricle has normal function. The left ventricle has no regional  wall motion abnormalities. There is severe concentric left ventricular  hypertrophy.   2. Right ventricular systolic function is normal. The right ventricular  size is normal. There is mildly elevated pulmonary artery systolic  pressure. Estimated right ventricular systolic pressure is 38.0 mmHg.   3. Moderate pericardial effusion. The pericardial effusion is  circumferential.   4. The mitral valve is normal in structure. Mild mitral valve  regurgitation. No evidence of mitral stenosis.   5. The aortic valve is normal in structure. Aortic valve regurgitation is  not visualized. No aortic stenosis is present.   6. Mildly dilated pulmonary artery.   7. The inferior vena cava is normal in size with greater than 50%  respiratory variability, suggesting right atrial pressure of 3 mmHg.   TEE 04/02/23: IMPRESSIONS   1. Left ventricular ejection fraction, by estimation, is 55 to 60%. The  left ventricle has normal function. There is moderate left ventricular  hypertrophy.   2. Right ventricular systolic function is normal. The right ventricular  size is normal.   3. No left atrial/left atrial appendage thrombus was detected.   4. Moderate pericardial effusion. The pericardial effusion is  circumferential. There is no evidence of cardiac tamponade.   5. The mitral valve is normal in structure. Mild mitral valve  regurgitation.   6. The aortic valve is normal in structure. Aortic valve regurgitation is not visualized.   Prior TTEs showed small-moderate pericardia effusion 02/10/22, small  pericardial effusion 06/03/22, moderate pericardial effusion 03/27/23, moderate pericardial effusion, circumferential without evidence of tamponade 03/29/21.    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   Heart murmur    Hypertension    Low blood sugar    Marijuana abuse    Noncompliance    Pericardial effusion 03/28/2023   Tobacco abuse     Past Surgical History:  Procedure Laterality Date   A/V FISTULAGRAM Right 10/27/2022   Procedure: A/V Fistulagram;  Surgeon: Dannis Dy, MD;  Location: [facility] INVASIVE CV LAB;  Service: Cardiovascular;  Laterality: Right;   APPENDECTOMY     AV FISTULA PLACEMENT Left 03/03/2019   Procedure: ARTERIOVENOUS (AV) FISTULA CREATION LEFT ARM;  Surgeon: Adine Hoof, MD;  Location: [facility] OR;  Service: Vascular;  Laterality: Left;   BASCILIC VEIN TRANSPOSITION Right 04/12/2019   Procedure: RIGHT UPPER EXTREMITY BASCILIC VEIN TRANSPOSITION FIRST STAGE FISTULA;  Surgeon: Dannis Dy, MD;  Location: [facility] OR;  Service: Vascular;  Laterality: Right;   BIOPSY  10/14/2021   Procedure: BIOPSY;  Surgeon: Albertina Hugger, MD;  Location: [facility] ENDOSCOPY;  Service: Gastroenterology;;   BIOPSY  06/06/2022   Procedure: BIOPSY;  Surgeon: Sergio Dandy, MD;  Location[facility] ENDOSCOPY;  Service: Gastroenterology;;   BIOPSY  11/12/2022   Procedure: BIOPSY;  Surgeon: Normie Becton., MD;  Location: [facility] ENDOSCOPY;  Service: Gastroenterology;;   BIOPSY  12/05/2022   Procedure: BIOPSY;  Surgeon: Elois Hair, MD;  Location: [facility] ENDOSCOPY;  Service: Gastroenterology;;   COLONOSCOPY WITH PROPOFOL  N/A 10/14/2021   Procedure: COLONOSCOPY  WITH PROPOFOL ;  Surgeon: Albertina Hugger, MD;  Location: [facility] ENDOSCOPY;  Service: Gastroenterology;  Laterality: N/A;   DIALYSIS/PERMA CATHETER INSERTION N/A 04/03/2023   Procedure: DIALYSIS/PERMA CATHETER INSERTION;  Surgeon: Carlene Che, MD;  [facility] INVASIVE CV LAB;  Service: Cardiovascular;  Laterality: N/A;   ESOPHAGOGASTRODUODENOSCOPY N/A 11/12/2022   Procedure: ESOPHAGOGASTRODUODENOSCOPY (EGD);  Surgeon: Normie Becton., MD;  Location[facility] ENDOSCOPY;  Service: Gastroenterology;  Laterality: N/A;   ESOPHAGOGASTRODUODENOSCOPY N/A 12/05/2022   Procedure: ESOPHAGOGASTRODUODENOSCOPY (EGD);  Surgeon: Elois Hair, MD;  Location: [facility] ENDOSCOPY;  Service: Gastroenterology;  Laterality: N/A;   ESOPHAGOGASTRODUODENOSCOPY (EGD) WITH PROPOFOL  N/A 06/06/2022   Procedure: ESOPHAGOGASTRODUODENOSCOPY (EGD) WITH PROPOFOL ;  Surgeon: Sergio Dandy, MD;  Location: [facility] ENDOSCOPY;  Service: Gastroenterology;  Laterality: N/A;   FISTULA SUPERFICIALIZATION Right 11/07/2022   Procedure: PLICATION OF LARGE ANEURYSMS OF RIGHT ARTERIOVENOUS FISTULA WITH INSERTION OF 7mm INTERPOSITIONAL GORTEX GRAFT;  Surgeon: Dannis Dy, MD;  Location: [facility] OR;  Service: Vascular;  Laterality: Right;   FLEXIBLE SIGMOIDOSCOPY N/A 04/14/2022   Procedure: FLEXIBLE SIGMOIDOSCOPY;  Surgeon: Sergio Dandy, MD;  Location[facility] ENDOSCOPY;  Service: Gastroenterology;  Laterality: N/A;   INSERTION OF DIALYSIS CATHETER N/A 11/07/2022   Procedure: INSERTION OF RIGHT INTERNAL JUGULAR TUNNELED DIALYSIS CATHETER;  Surgeon: Dannis Dy, MD;  Location: [facility] OR;  Service: Vascular;  Laterality: N/A;   LIGATION OF ARTERIOVENOUS  FISTULA Left 03/06/2019   Procedure: LIGATION OF ARTERIOVENOUS  FISTULA;  Surgeon: Young Hensen, MD;  Location: [facility] OR;  Service: Vascular;  Laterality: Left;   PERIPHERAL VASCULAR BALLOON ANGIOPLASTY  10/27/2022   Procedure: PERIPHERAL VASCULAR BALLOON ANGIOPLASTY;  Surgeon:  Dannis Dy, MD;  Location: [facility] INVASIVE CV LAB;  Service: Cardiovascular;;  rt upper arm fistula   SAVORY DILATION N/A 11/12/2022   Procedure: SAVORY DILATION;  Surgeon: Brice Campi Albino Alu., MD;  Location: [facility] ENDOSCOPY;  Service: Gastroenterology;  Laterality: N/A;   spinal tap     SPINE SURGERY     related to a spinal infection, unsure of surgery or infection source   TRANSESOPHAGEAL ECHOCARDIOGRAM (CATH LAB) N/A 04/02/2023   Procedure: TRANSESOPHAGEAL ECHOCARDIOGRAM;  Surgeon: Hugh Madura, MD;  [facility] INVASIVE CV LAB;  Service: Cardiovascular;  Laterality: N/A;   WISDOM TOOTH EXTRACTION      MEDICATIONS: No current facility-administered medications for this encounter.    carvedilol  (COREG ) 25 MG tablet   divalproex  (DEPAKOTE  ER) 500 MG 24 hr tablet   hydrALAZINE  (APRESOLINE ) 100 MG tablet   minoxidil  (LONITEN ) 10 MG tablet   NIFEdipine  (ADALAT  CC) 60 MG 24 hr tablet   pantoprazole  (PROTONIX ) 40 MG tablet   sevelamer  carbonate (RENVELA ) 800 MG tablet   adapalene  (ADAPALENE  TREATMENT) 0.1 % gel   Cinacalcet HCl (SENSIPAR PO)   fluticasone  (FLONASE ) 50 MCG/ACT nasal spray   gabapentin  (NEURONTIN ) 100 MG capsule   hydrOXYzine  (ATARAX ) 10 MG tablet   Methoxy PEG-Epoetin Beta (MIRCERA IJ)   Methoxy PEG-Epoetin Beta (MIRCERA IJ)   ofloxacin  (FLOXIN ) 0.3 % OTIC solution    Ella Gun, PA-C Surgical Short Stay/Anesthesiology [facility] Phone [phone] [facility] Phone [phone] 10/22/2023 5:16 PM

## 2023-10-22 NOTE — Anesthesia Preprocedure Evaluation (Signed)
 Anesthesia Evaluation    Airway        Dental   Pulmonary Current Smoker and Patient abstained from smoking.          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 Left ventricular ejection fraction, by estimation, is 55 to 60%. The  left ventricle has normal function. The left ventricle has no regional  wall motion abnormalities. There is severe concentric left ventricular  hypertrophy.  2. Right ventricular systolic function is normal. The right ventricular  size is normal. There is mildly elevated pulmonary artery systolic  pressure. Estimated right ventricular systolic pressure is 38.0 mmHg.  3. Moderate pericardial effusion. The pericardial effusion is  circumferential.  4. The mitral valve is normal in structure. Mild mitral valve  regurgitation. No evidence of mitral stenosis.  5. The aortic valve is normal in structure. Aortic valve regurgitation is  not visualized. No aortic stenosis is present.  6. Mildly dilated pulmonary artery.  7. The inferior vena cava is normal in size with greater than 50%  respiratory variability, suggesting right atrial pressure of 3 mmHg.     Neuro/Psych    GI/Hepatic   Endo/Other    Renal/GU      Musculoskeletal   Abdominal   Peds  Hematology   Anesthesia Other Findings   Reproductive/Obstetrics                             Anesthesia Physical Anesthesia Plan  ASA:   Anesthesia Plan:    Post-op Pain Management:    Induction:   PONV Risk Score and Plan:   Airway Management Planned:   Additional Equipment:   Intra-op Plan:   Post-operative Plan:   Informed Consent:   Plan Discussed with:   Anesthesia Plan Comments: (PAT note written 10/22/2023 by Roylee Chaffin, PA-C.  )       Anesthesia Quick Evaluation

## 2023-10-23 NOTE — Progress Notes (Signed)
 Unable to reach pt. Spoke with Mari Shine who is listed as contact in chart. Ms. Daneil Dunker told me that Mr. Delsanto had gone to the Emergency room at [facility] in [place] due to bleeding problems with his dialysis fistula. She told me I should speak with patient's cousin,Tanika Pernell, regarding pre op instructions. Pre op instruuctions given to Ms. Pernell over the phone. Opportunity given for questions;Ms. Pernell had no further questions. Gave her my number [phone] to call until 7pm if she or Mr. Humiston needed to call. Also gave her the short stay number to call [phone] for over the weekend or Monday morning.

## 2023-10-26 ENCOUNTER — Encounter (HOSPITAL_COMMUNITY): Payer: Self-pay | Admitting: Vascular Surgery

## 2023-10-26 ENCOUNTER — Ambulatory Visit (HOSPITAL_COMMUNITY): Admission: RE | Admit: 2023-10-26 | Source: Home / Self Care | Admitting: General Surgery

## 2023-10-26 SURGERY — EXCISION MASS, BACK
Anesthesia: LOCAL | Laterality: Right

## 2023-11-11 ENCOUNTER — Telehealth: Payer: Self-pay

## 2023-11-11 NOTE — Transitions of Care (Post Inpatient/ED Visit) (Signed)
   11/11/2023  Name: [name] MRN: [id number] DOB: 22-Aug-1985  Today's TOC FU Call Status: Today's TOC FU Call Status:: Unsuccessful Call (1st Attempt) Unsuccessful Call (1st Attempt) Date: 11/11/23  Attempted to reach the patient regarding the most recent Inpatient/ED visit.  Follow Up Plan: Additional outreach attempts will be made to reach the patient to complete the Transitions of Care (Post Inpatient/ED visit) call.    Bing Edison MSN, RN RN Case Sales executive Health  [facility] Office Hours M-F [phone] Direct Dial : [phone] Main Phone [phone]  Fax: [phone] [email or web address]

## 2023-11-12 ENCOUNTER — Telehealth: Payer: Self-pay

## 2023-11-12 NOTE — Transitions of Care (Post Inpatient/ED Visit) (Signed)
   11/12/2023  Name: [name] MRN: [id number] DOB: 02-Aug-1985  Today's TOC FU Call Status: Today's TOC FU Call Status:: Unsuccessful Call (2nd Attempt) Unsuccessful Call (2nd Attempt) Date: 11/12/23  Attempted to reach the patient regarding the most recent Inpatient/ED visit. Patient unable to hear per contact note, but no DPR. Note indicates to call Mother in [place] which is also number listed for patient. No answer.   Follow Up Plan: Additional outreach attempts will be made to reach the patient to complete the Transitions of Care (Post Inpatient/ED visit) call.    Bing Edison MSN, RN RN Case Sales executive Health  [facility] Office Hours M-F [phone] Direct Dial : [phone] Main Phone [phone]  Fax: [phone] [email or web address]

## 2023-11-17 ENCOUNTER — Telehealth: Payer: Self-pay

## 2023-11-17 NOTE — Transitions of Care (Post Inpatient/ED Visit) (Signed)
   11/17/2023  Name: [name] MRN: [id number] DOB: 01-02-1986  Today's TOC FU Call Status: Today's TOC FU Call Status:: Unsuccessful Call (3rd Attempt) Unsuccessful Call (3rd Attempt) Date: 11/17/23  Attempted to reach the patient regarding the most recent Inpatient/ED visit.  Follow Up Plan: No further outreach attempts will be made at this time. We have been unable to contact the patient.   Bing Edison MSN, RN RN Case Sales executive Health  [facility] Office Hours M-F [phone] Direct Dial : [phone] Main Phone [phone]  Fax: [phone] [email or web address]

## 2023-11-20 ENCOUNTER — Encounter: Payer: Self-pay | Admitting: Nurse Practitioner

## 2023-11-25 ENCOUNTER — Encounter: Payer: Self-pay | Admitting: Cardiology

## 2023-12-03 ENCOUNTER — Other Ambulatory Visit: Payer: Self-pay | Admitting: Cardiology

## 2023-12-15 NOTE — Progress Notes (Signed)
    PATIENTOLLIE, [name] #: [id number]  MRN:  [id number] PROVIDER Name:        JACQUELINE DANIELLE LIPPERT MD     PHYSICIAN RESPONSE: - Pulmonary Edema radiologic finding only, clinically insignificant DOCUMENTATION CLARIFICATION:  MRN: [id number]  Mild pulmonary edema is documented in the medical record. The etiology (Cardiogenic or Noncardiogenic) of this diagnosis requires further specification. 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 Antihypertensive regimen was titrated as needed for optimal control and imdur  was added to regimen.   PN Dr Francella 11/27/2023 notes,   chest x-ray only showed mild interstitial pulmonary edema.  DS notes,  CXR showed mild pulmonary edema with nonischemic EKG and normal troponin. Antihypertensive regimen was titrated as needed for optimal control and imdur  was added to regimen.  --  Reference:  Cardiogenic Causes: - Tachyarrhythmias (Specify Such as: Afib, Aflutter, both Afib and Aflutter, Atrial Tach, SVT) - Heart Failure - Hypertensive (Specify Such as: Crisis, Emergency, Urgency) - Acute Coronary Syndrome - Ischemia - Pregnancy or In Post-Partum Period   Noncardiogenic Causes: - Sepsis - Respiratory (Specify Such as: ARDS, Acute Lung Injury, PNA, Pulm HTN) - ESRD - Major Trauma - Multiple Blood Transfusions, IV Fluids - Portal Hypertension    Clarification initiated by: Peterman, Dana on 12/08/2023 3:18 PM  Disclaimer: The purpose of this clarification is to ensure the accuracy and integrity of the documentation, and resultant codes, where this is conflicting, ambiguous or incomplete  information, or clinical evidence for a higher degree of specificity or severity. In addition to clinical responses provided, providers are also presented with free-text and unable to determine response options.    Electronically signed by:  JACQUELINE DANIELLE LIPPERT MD 12/15/2023 7:29 AM

## 2023-12-26 ENCOUNTER — Emergency Department (HOSPITAL_COMMUNITY)

## 2023-12-26 ENCOUNTER — Inpatient Hospital Stay (HOSPITAL_COMMUNITY)
Admission: EM | Admit: 2023-12-26 | Discharge: 2023-12-28 | DRG: 304 | Disposition: A | Discharge status: Home / Self Care | Attending: Internal Medicine | Admitting: Internal Medicine

## 2023-12-26 ENCOUNTER — Encounter (HOSPITAL_COMMUNITY): Payer: Self-pay | Admitting: *Deleted

## 2023-12-26 ENCOUNTER — Other Ambulatory Visit: Payer: Self-pay

## 2023-12-26 DIAGNOSIS — E8770 Fluid overload, unspecified: Secondary | ICD-10-CM | POA: Diagnosis present

## 2023-12-26 DIAGNOSIS — I3139 Other pericardial effusion (noninflammatory): Secondary | ICD-10-CM | POA: Diagnosis present

## 2023-12-26 DIAGNOSIS — I169 Hypertensive crisis, unspecified: Secondary | ICD-10-CM | POA: Diagnosis present

## 2023-12-26 DIAGNOSIS — N2581 Secondary hyperparathyroidism of renal origin: Secondary | ICD-10-CM | POA: Diagnosis present

## 2023-12-26 DIAGNOSIS — N186 End stage renal disease: Secondary | ICD-10-CM | POA: Diagnosis present

## 2023-12-26 DIAGNOSIS — F319 Bipolar disorder, unspecified: Secondary | ICD-10-CM | POA: Diagnosis present

## 2023-12-26 DIAGNOSIS — Z822 Family history of deafness and hearing loss: Secondary | ICD-10-CM

## 2023-12-26 DIAGNOSIS — Z888 Allergy status to other drugs, medicaments and biological substances status: Secondary | ICD-10-CM

## 2023-12-26 DIAGNOSIS — Z789 Other specified health status: Secondary | ICD-10-CM

## 2023-12-26 DIAGNOSIS — Z79899 Other long term (current) drug therapy: Secondary | ICD-10-CM

## 2023-12-26 DIAGNOSIS — H9193 Unspecified hearing loss, bilateral: Secondary | ICD-10-CM | POA: Diagnosis present

## 2023-12-26 DIAGNOSIS — Z992 Dependence on renal dialysis: Secondary | ICD-10-CM

## 2023-12-26 DIAGNOSIS — Z8249 Family history of ischemic heart disease and other diseases of the circulatory system: Secondary | ICD-10-CM

## 2023-12-26 DIAGNOSIS — I10 Essential (primary) hypertension: Secondary | ICD-10-CM | POA: Diagnosis present

## 2023-12-26 DIAGNOSIS — Q8781 Alport syndrome: Secondary | ICD-10-CM

## 2023-12-26 DIAGNOSIS — U071 COVID-19: Principal | ICD-10-CM | POA: Insufficient documentation

## 2023-12-26 DIAGNOSIS — I160 Hypertensive urgency: Secondary | ICD-10-CM | POA: Diagnosis present

## 2023-12-26 DIAGNOSIS — K219 Gastro-esophageal reflux disease without esophagitis: Secondary | ICD-10-CM | POA: Diagnosis present

## 2023-12-26 DIAGNOSIS — F1210 Cannabis abuse, uncomplicated: Secondary | ICD-10-CM | POA: Diagnosis present

## 2023-12-26 DIAGNOSIS — R011 Cardiac murmur, unspecified: Secondary | ICD-10-CM | POA: Diagnosis present

## 2023-12-26 DIAGNOSIS — I161 Hypertensive emergency: Secondary | ICD-10-CM | POA: Diagnosis not present

## 2023-12-26 DIAGNOSIS — I132 Hypertensive heart and chronic kidney disease with heart failure and with stage 5 chronic kidney disease, or end stage renal disease: Secondary | ICD-10-CM | POA: Diagnosis present

## 2023-12-26 DIAGNOSIS — Z886 Allergy status to analgesic agent status: Secondary | ICD-10-CM

## 2023-12-26 DIAGNOSIS — F17210 Nicotine dependence, cigarettes, uncomplicated: Secondary | ICD-10-CM | POA: Diagnosis present

## 2023-12-26 DIAGNOSIS — R1084 Generalized abdominal pain: Secondary | ICD-10-CM | POA: Diagnosis not present

## 2023-12-26 DIAGNOSIS — D631 Anemia in chronic kidney disease: Secondary | ICD-10-CM | POA: Diagnosis present

## 2023-12-26 DIAGNOSIS — I5022 Chronic systolic (congestive) heart failure: Secondary | ICD-10-CM | POA: Diagnosis present

## 2023-12-26 DIAGNOSIS — D638 Anemia in other chronic diseases classified elsewhere: Secondary | ICD-10-CM | POA: Diagnosis present

## 2023-12-26 DIAGNOSIS — J810 Acute pulmonary edema: Secondary | ICD-10-CM

## 2023-12-26 DIAGNOSIS — H9190 Unspecified hearing loss, unspecified ear: Secondary | ICD-10-CM

## 2023-12-26 LAB — COMPREHENSIVE METABOLIC PANEL WITH GFR
ALT: 15 U/L (ref 0–44)
AST: 23 U/L (ref 15–41)
Albumin: 3.6 g/dL (ref 3.5–5.0)
Alkaline Phosphatase: 138 U/L — ABNORMAL HIGH (ref 38–126)
Anion gap: 14 (ref 5–15)
BUN: 17 mg/dL (ref 6–20)
CO2: 28 mmol/L (ref 22–32)
Calcium: 9.3 mg/dL (ref 8.9–10.3)
Chloride: 96 mmol/L — ABNORMAL LOW (ref 98–111)
Creatinine, Ser: 8.13 mg/dL — ABNORMAL HIGH (ref 0.61–1.24)
GFR, Estimated: 8 mL/min — ABNORMAL LOW (ref >60)
Glucose, Bld: 73 mg/dL (ref 70–99)
Potassium: 4.1 mmol/L (ref 3.5–5.1)
Sodium: 138 mmol/L (ref 135–145)
Total Bilirubin: 1 mg/dL (ref 0.0–1.2)
Total Protein: 7.5 g/dL (ref 6.5–8.1)

## 2023-12-26 LAB — CBC WITH DIFFERENTIAL/PLATELET
Abs Immature Granulocytes: 0.01 K/uL (ref 0.00–0.07)
Basophils Absolute: 0 K/uL (ref 0.0–0.1)
Basophils Relative: 1 %
Eosinophils Absolute: 0.1 K/uL (ref 0.0–0.5)
Eosinophils Relative: 3 %
HCT: 31.5 % — ABNORMAL LOW (ref 39.0–52.0)
Hemoglobin: 10.1 g/dL — ABNORMAL LOW (ref 13.0–17.0)
Immature Granulocytes: 0 %
Lymphocytes Relative: 11 %
Lymphs Abs: 0.4 K/uL — ABNORMAL LOW (ref 0.7–4.0)
MCH: 30.3 pg (ref 26.0–34.0)
MCHC: 32.1 g/dL (ref 30.0–36.0)
MCV: 94.6 fL (ref 80.0–100.0)
Monocytes Absolute: 0.4 K/uL (ref 0.1–1.0)
Monocytes Relative: 9 %
Neutro Abs: 2.9 K/uL (ref 1.7–7.7)
Neutrophils Relative %: 76 %
Platelets: 164 K/uL (ref 150–400)
RBC: 3.33 MIL/uL — ABNORMAL LOW (ref 4.22–5.81)
RDW: 16.6 % — ABNORMAL HIGH (ref 11.5–15.5)
WBC: 3.8 K/uL — ABNORMAL LOW (ref 4.0–10.5)
nRBC: 0 % (ref 0.0–0.2)

## 2023-12-26 LAB — I-STAT CG4 LACTIC ACID, ED: Lactic Acid, Venous: 0.5 mmol/L (ref 0.5–1.9)

## 2023-12-26 LAB — CBG MONITORING, ED: Glucose-Capillary: 85 mg/dL (ref 70–99)

## 2023-12-26 LAB — LIPASE, BLOOD: Lipase: 34 U/L (ref 11–51)

## 2023-12-26 MED ORDER — IOHEXOL 350 MG/ML SOLN
75.0000 mL | Freq: Once | INTRAVENOUS | Status: AC | PRN
  Administered 2023-12-26: 75 mL via INTRAVENOUS

## 2023-12-26 MED ORDER — ONDANSETRON 4 MG PO TBDP
4.0000 mg | ORAL_TABLET | Freq: Once | ORAL | Status: AC
  Administered 2023-12-26: 4 mg via ORAL
  Filled 2023-12-26: qty 1

## 2023-12-26 NOTE — ED Provider Triage Note (Signed)
 Emergency Medicine Provider Triage Evaluation Note  [name] , a 38 y.o. male  was evaluated in triage.  Pt complains of abdominal pain.  Patient reports that he has been having issues with generalized abdominal pain, nausea, vomiting, diarrhea for the last several days.  States that he is unable to keep anything down.  No reported fever, chills body aches.  No sick contacts.  Does report that he recently had a fistula placed to initiate dialysis.  He states that he now feels worse than before he had the surgery done.  Review of Systems  Positive: As above Negative: As above  Physical Exam  BP (!) 197/123 (BP Location: Right Arm)   Pulse (!) 107   Temp 100.1 F (37.8 C)   Resp 18   Ht 5' 8 (1.727 m)   Wt 84.3 kg   SpO2 98%   BMI 28.26 kg/m  Gen:   Awake, no distress   Resp:  Normal effort MSK:   Moves extremities without difficulty  Other:    Medical Decision Making  Medically screening exam initiated at 8:10 PM.  Appropriate orders placed.  [name] was informed that the remainder of the evaluation will be completed by another provider, this initial triage assessment does not replace that evaluation, and the importance of remaining in the ED until their evaluation is complete.     [name] A, PA-C 12/26/23 2012

## 2023-12-26 NOTE — ED Triage Notes (Signed)
 The pt feel like he has some type infection dizziness and  both feet hurt also  the pt  cannot hear he reads lips   recent lt arm for a dialysis fistula  he has had his surgery at [facility]

## 2023-12-27 ENCOUNTER — Emergency Department (HOSPITAL_COMMUNITY)

## 2023-12-27 DIAGNOSIS — D631 Anemia in chronic kidney disease: Secondary | ICD-10-CM | POA: Diagnosis present

## 2023-12-27 DIAGNOSIS — F17210 Nicotine dependence, cigarettes, uncomplicated: Secondary | ICD-10-CM | POA: Diagnosis present

## 2023-12-27 DIAGNOSIS — K219 Gastro-esophageal reflux disease without esophagitis: Secondary | ICD-10-CM | POA: Diagnosis present

## 2023-12-27 DIAGNOSIS — F1210 Cannabis abuse, uncomplicated: Secondary | ICD-10-CM | POA: Diagnosis present

## 2023-12-27 DIAGNOSIS — I161 Hypertensive emergency: Secondary | ICD-10-CM | POA: Diagnosis present

## 2023-12-27 DIAGNOSIS — I169 Hypertensive crisis, unspecified: Secondary | ICD-10-CM | POA: Diagnosis present

## 2023-12-27 DIAGNOSIS — U071 COVID-19: Secondary | ICD-10-CM | POA: Diagnosis present

## 2023-12-27 DIAGNOSIS — Z992 Dependence on renal dialysis: Secondary | ICD-10-CM

## 2023-12-27 DIAGNOSIS — E8770 Fluid overload, unspecified: Secondary | ICD-10-CM | POA: Diagnosis present

## 2023-12-27 DIAGNOSIS — I160 Hypertensive urgency: Secondary | ICD-10-CM

## 2023-12-27 DIAGNOSIS — Z886 Allergy status to analgesic agent status: Secondary | ICD-10-CM | POA: Diagnosis not present

## 2023-12-27 DIAGNOSIS — Q8781 Alport syndrome: Secondary | ICD-10-CM | POA: Diagnosis not present

## 2023-12-27 DIAGNOSIS — H9193 Unspecified hearing loss, bilateral: Secondary | ICD-10-CM | POA: Diagnosis present

## 2023-12-27 DIAGNOSIS — Z8249 Family history of ischemic heart disease and other diseases of the circulatory system: Secondary | ICD-10-CM | POA: Diagnosis not present

## 2023-12-27 DIAGNOSIS — Z79899 Other long term (current) drug therapy: Secondary | ICD-10-CM | POA: Diagnosis not present

## 2023-12-27 DIAGNOSIS — R011 Cardiac murmur, unspecified: Secondary | ICD-10-CM | POA: Diagnosis present

## 2023-12-27 DIAGNOSIS — I421 Obstructive hypertrophic cardiomyopathy: Secondary | ICD-10-CM | POA: Diagnosis not present

## 2023-12-27 DIAGNOSIS — N186 End stage renal disease: Secondary | ICD-10-CM

## 2023-12-27 DIAGNOSIS — N2581 Secondary hyperparathyroidism of renal origin: Secondary | ICD-10-CM | POA: Diagnosis present

## 2023-12-27 DIAGNOSIS — F319 Bipolar disorder, unspecified: Secondary | ICD-10-CM | POA: Diagnosis present

## 2023-12-27 DIAGNOSIS — Z822 Family history of deafness and hearing loss: Secondary | ICD-10-CM | POA: Diagnosis not present

## 2023-12-27 DIAGNOSIS — Z789 Other specified health status: Secondary | ICD-10-CM | POA: Diagnosis not present

## 2023-12-27 DIAGNOSIS — Z888 Allergy status to other drugs, medicaments and biological substances status: Secondary | ICD-10-CM | POA: Diagnosis not present

## 2023-12-27 DIAGNOSIS — I5022 Chronic systolic (congestive) heart failure: Secondary | ICD-10-CM | POA: Diagnosis present

## 2023-12-27 DIAGNOSIS — I3139 Other pericardial effusion (noninflammatory): Secondary | ICD-10-CM | POA: Diagnosis present

## 2023-12-27 DIAGNOSIS — I132 Hypertensive heart and chronic kidney disease with heart failure and with stage 5 chronic kidney disease, or end stage renal disease: Secondary | ICD-10-CM | POA: Diagnosis present

## 2023-12-27 DIAGNOSIS — R1084 Generalized abdominal pain: Secondary | ICD-10-CM | POA: Diagnosis present

## 2023-12-27 LAB — RESP PANEL BY RT-PCR (RSV, FLU A&B, COVID)  RVPGX2
Influenza A by PCR: NEGATIVE
Influenza B by PCR: NEGATIVE
Resp Syncytial Virus by PCR: NEGATIVE
SARS Coronavirus 2 by RT PCR: POSITIVE — AB

## 2023-12-27 LAB — MRSA NEXT GEN BY PCR, NASAL: MRSA by PCR Next Gen: NOT DETECTED

## 2023-12-27 LAB — CG4 I-STAT (LACTIC ACID): Lactic Acid, Venous: 0.5 mmol/L (ref 0.5–1.9)

## 2023-12-27 LAB — GLUCOSE, CAPILLARY: Glucose-Capillary: 85 mg/dL (ref 70–99)

## 2023-12-27 MED ORDER — NICARDIPINE HCL IN NACL 20-0.86 MG/200ML-% IV SOLN
3.0000 mg/h | INTRAVENOUS | Status: DC
  Administered 2023-12-27: 15 mg/h via INTRAVENOUS
  Administered 2023-12-27: 5 mg/h via INTRAVENOUS
  Filled 2023-12-27 (×4): qty 200

## 2023-12-27 MED ORDER — SEVELAMER CARBONATE 800 MG PO TABS
1600.0000 mg | ORAL_TABLET | Freq: Three times a day (TID) | ORAL | Status: DC
  Administered 2023-12-27 – 2023-12-28 (×5): 1600 mg via ORAL
  Filled 2023-12-27 (×6): qty 2

## 2023-12-27 MED ORDER — NIFEDIPINE ER OSMOTIC RELEASE 60 MG PO TB24
90.0000 mg | ORAL_TABLET | Freq: Two times a day (BID) | ORAL | Status: DC
  Administered 2023-12-27 – 2023-12-28 (×2): 90 mg via ORAL
  Filled 2023-12-27 (×3): qty 1

## 2023-12-27 MED ORDER — ONDANSETRON HCL 4 MG/2ML IJ SOLN: 4.0000 mg | Freq: Four times a day (QID) | INTRAMUSCULAR | Status: DC | PRN

## 2023-12-27 MED ORDER — HYDRALAZINE HCL 20 MG/ML IJ SOLN
5.0000 mg | Freq: Once | INTRAMUSCULAR | Status: AC
  Administered 2023-12-27: 5 mg via INTRAVENOUS
  Filled 2023-12-27: qty 1

## 2023-12-27 MED ORDER — HYDROXYZINE HCL 10 MG PO TABS: 5.0000 mg | ORAL_TABLET | Freq: Two times a day (BID) | ORAL | Status: DC | PRN

## 2023-12-27 MED ORDER — LABETALOL HCL 5 MG/ML IV SOLN
10.0000 mg | Freq: Once | INTRAVENOUS | Status: AC
  Administered 2023-12-27: 10 mg via INTRAVENOUS
  Filled 2023-12-27: qty 4

## 2023-12-27 MED ORDER — NICARDIPINE HCL IN NACL 40-0.83 MG/200ML-% IV SOLN
3.0000 mg/h | INTRAVENOUS | Status: DC
  Administered 2023-12-27 (×2): 15 mg/h via INTRAVENOUS
  Administered 2023-12-27: 2.5 mg/h via INTRAVENOUS
  Administered 2023-12-28: 7.5 mg/h via INTRAVENOUS
  Filled 2023-12-27 (×5): qty 200

## 2023-12-27 MED ORDER — LOSARTAN POTASSIUM 50 MG PO TABS
100.0000 mg | ORAL_TABLET | Freq: Every day | ORAL | Status: DC
  Administered 2023-12-27 – 2023-12-28 (×2): 100 mg via ORAL
  Filled 2023-12-27 (×2): qty 2

## 2023-12-27 MED ORDER — PANTOPRAZOLE SODIUM 40 MG PO TBEC
40.0000 mg | DELAYED_RELEASE_TABLET | Freq: Every day | ORAL | Status: DC
  Administered 2023-12-28: 40 mg via ORAL
  Filled 2023-12-27: qty 1

## 2023-12-27 MED ORDER — DIPHENHYDRAMINE HCL 50 MG/ML IJ SOLN
12.5000 mg | Freq: Once | INTRAMUSCULAR | Status: AC
  Administered 2023-12-27: 12.5 mg via INTRAVENOUS
  Filled 2023-12-27: qty 1

## 2023-12-27 MED ORDER — CALCIUM CARBONATE ANTACID 500 MG PO CHEW
1.0000 | CHEWABLE_TABLET | Freq: Three times a day (TID) | ORAL | Status: DC | PRN
  Administered 2023-12-27: 200 mg via ORAL
  Filled 2023-12-27 (×2): qty 1

## 2023-12-27 MED ORDER — FENTANYL CITRATE PF 50 MCG/ML IJ SOSY
25.0000 ug | PREFILLED_SYRINGE | Freq: Once | INTRAMUSCULAR | Status: AC
  Administered 2023-12-27: 25 ug via INTRAVENOUS
  Filled 2023-12-27: qty 1

## 2023-12-27 MED ORDER — POLYETHYLENE GLYCOL 3350 17 G PO PACK: 17.0000 g | PACK | Freq: Every day | ORAL | Status: DC | PRN

## 2023-12-27 MED ORDER — DOCUSATE SODIUM 100 MG PO CAPS: 100.0000 mg | ORAL_CAPSULE | Freq: Two times a day (BID) | ORAL | Status: DC | PRN

## 2023-12-27 MED ORDER — LABETALOL HCL 5 MG/ML IV SOLN: 10.0000 mg | Freq: Once | INTRAVENOUS | Status: AC

## 2023-12-27 MED ORDER — ACETAMINOPHEN 325 MG PO TABS
650.0000 mg | ORAL_TABLET | Freq: Four times a day (QID) | ORAL | Status: DC | PRN
  Administered 2023-12-27 (×3): 650 mg via ORAL
  Filled 2023-12-27 (×3): qty 2

## 2023-12-27 MED ORDER — METOCLOPRAMIDE HCL 5 MG/ML IJ SOLN
10.0000 mg | Freq: Once | INTRAMUSCULAR | Status: AC
  Administered 2023-12-27: 10 mg via INTRAVENOUS
  Filled 2023-12-27: qty 2

## 2023-12-27 MED ORDER — LABETALOL HCL 5 MG/ML IV SOLN
INTRAVENOUS | Status: AC
  Administered 2023-12-27: 10 mg via INTRAVENOUS
  Filled 2023-12-27: qty 4

## 2023-12-27 MED ORDER — HYDRALAZINE HCL 50 MG PO TABS
100.0000 mg | ORAL_TABLET | Freq: Three times a day (TID) | ORAL | Status: DC
  Administered 2023-12-27 – 2023-12-28 (×4): 100 mg via ORAL
  Filled 2023-12-27 (×4): qty 2

## 2023-12-27 MED ORDER — DIVALPROEX SODIUM ER 500 MG PO TB24
500.0000 mg | ORAL_TABLET | Freq: Every day | ORAL | Status: DC
  Administered 2023-12-27 – 2023-12-28 (×2): 500 mg via ORAL
  Filled 2023-12-27 (×3): qty 1

## 2023-12-27 MED ORDER — HEPARIN SODIUM (PORCINE) 5000 UNIT/ML IJ SOLN
5000.0000 [IU] | Freq: Two times a day (BID) | INTRAMUSCULAR | Status: DC
  Administered 2023-12-27 – 2023-12-28 (×3): 5000 [IU] via SUBCUTANEOUS
  Filled 2023-12-27 (×3): qty 1

## 2023-12-27 MED ORDER — DROPERIDOL 2.5 MG/ML IJ SOLN
1.2500 mg | Freq: Once | INTRAMUSCULAR | Status: AC
  Administered 2023-12-27: 1.25 mg via INTRAVENOUS
  Filled 2023-12-27: qty 2

## 2023-12-27 MED ORDER — NIFEDIPINE ER OSMOTIC RELEASE 60 MG PO TB24
90.0000 mg | ORAL_TABLET | Freq: Every day | ORAL | Status: DC
  Administered 2023-12-27: 90 mg via ORAL
  Filled 2023-12-27: qty 1

## 2023-12-27 MED ORDER — CARVEDILOL 25 MG PO TABS
25.0000 mg | ORAL_TABLET | Freq: Two times a day (BID) | ORAL | Status: DC
  Administered 2023-12-27 – 2023-12-28 (×3): 25 mg via ORAL
  Filled 2023-12-27 (×3): qty 2

## 2023-12-27 MED ORDER — FUROSEMIDE 10 MG/ML IJ SOLN
40.0000 mg | Freq: Once | INTRAMUSCULAR | Status: AC
  Administered 2023-12-27: 40 mg via INTRAVENOUS
  Filled 2023-12-27: qty 4

## 2023-12-27 MED ORDER — DOXAZOSIN MESYLATE 8 MG PO TABS
8.0000 mg | ORAL_TABLET | Freq: Every day | ORAL | Status: DC
  Administered 2023-12-27 – 2023-12-28 (×2): 8 mg via ORAL
  Filled 2023-12-27 (×2): qty 1

## 2023-12-27 MED ORDER — PANTOPRAZOLE SODIUM 40 MG IV SOLR
40.0000 mg | INTRAVENOUS | Status: DC
  Administered 2023-12-27: 40 mg via INTRAVENOUS
  Filled 2023-12-27: qty 10

## 2023-12-27 MED ORDER — GABAPENTIN 100 MG PO CAPS: 100.0000 mg | ORAL_CAPSULE | ORAL | Status: DC

## 2023-12-27 MED ORDER — LABETALOL HCL 5 MG/ML IV SOLN
10.0000 mg | Freq: Four times a day (QID) | INTRAVENOUS | Status: DC | PRN
  Administered 2023-12-27 (×2): 10 mg via INTRAVENOUS
  Filled 2023-12-27 (×3): qty 4

## 2023-12-27 MED ORDER — CHLORHEXIDINE GLUCONATE CLOTH 2 % EX PADS
6.0000 | MEDICATED_PAD | Freq: Every day | CUTANEOUS | Status: DC
  Administered 2023-12-27 – 2023-12-28 (×2): 6 via TOPICAL

## 2023-12-27 MED ORDER — NIFEDIPINE ER OSMOTIC RELEASE 60 MG PO TB24: 60.0000 mg | ORAL_TABLET | Freq: Every day | ORAL | Status: DC

## 2023-12-27 NOTE — Progress Notes (Signed)
 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

## 2023-12-27 NOTE — Progress Notes (Addendum)
 eLink Physician-Brief Progress Note Patient Name: [name] DOB: 20-Dec-1985 MRN: [id number]   Date of Service  12/27/2023  HPI/Events of Note  Patient now in ICU. CCM note reviewed. He is on 15 mg/hour of Nicardipine  infusion and his BP is 220. His BP is checked on his right forearm because he had multiple surgeries on his right arm several years ago and was told that BP should not be taken on it. He has a left AV fistula which has not matured yet. He has headache (not new, came in with it). Nothing focal on exam per CCM note and per RN as well.   eICU Interventions  Try Labetalol  and a dose of fentanyl  to help with BP and pain. He also has migraines and has been having headache without any change for ? 4 months. Has oral meds ordered as well . Gets HD from right chest HD cath.      Intervention Category Intermediate Interventions: Hypertension - evaluation and management Evaluation Type: New Patient Evaluation  [name] 12/27/2023, 5:39 AM  6-05 am - Pain controlled quickly , asleep. HR better. However still with SBP 235. Repeat labetalol  dose now. Will give the oral hydralazine  now. DW ground team, may need to have art line placed for accurate BP monitoring especially if ending up on another BP med infusion. RT will try right radial art line since that is the only site they can attempt. If that is not possible then will need a femoral art line.

## 2023-12-27 NOTE — ED Provider Notes (Signed)
 [place] EMERGENCY DEPARTMENT AT [facility] Provider Note   CSN: [id number] Arrival date & time: 12/26/23  8144     Patient presents with: Abdominal Pain   [name] is a 38 y.o. male.   The history is provided by the patient.  Abdominal Pain Pain location:  Generalized Pain quality: aching   Pain radiates to:  Does not radiate Pain severity:  Moderate Onset quality:  Gradual Timing:  Constant Progression:  Unchanged Chronicity:  New Context: not suspicious food intake   Relieved by:  Nothing Worsened by:  Nothing Ineffective treatments:  None tried Associated symptoms: fever   Associated symptoms: no vomiting   Associated symptoms comment:  Headache congestion  Risk factors: no aspirin  use and no NSAID use   Patient with ESRD and CHF and non compliance presents with congestion, headache, body aches and abdominal pain.  Went to dialysis earlier in the day.     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   Heart murmur    Hypertension    Low blood sugar    Marijuana abuse    Noncompliance    Pericardial effusion 03/28/2023   Tobacco abuse      Prior to Admission medications   Medication Sig Start Date End Date Taking? Authorizing Provider  adapalene  (ADAPALENE  TREATMENT) 0.1 % gel Apply topically at bedtime. Patient not taking: Reported on 10/22/2023 07/21/23   Joshua Domino, DO  carvedilol  (COREG ) 25 MG tablet TAKE 1 TABLET (25 MG TOTAL) BY MOUTH TWICE A DAY WITH MEALS 09/30/23   Joshua Domino, DO  Cinacalcet HCl (SENSIPAR PO) Take 30 mg by mouth. 10/08/23 10/04/24  [provider]  divalproex  (DEPAKOTE  ER) 500 MG 24 hr tablet Take  1 tablet (500 mg total) by mouth daily. 04/04/23   Joshua Domino, DO  fluticasone  (FLONASE ) 50 MCG/ACT nasal spray Place 1 spray into both nostrils daily. Patient not taking: Reported on 10/22/2023 05/14/23 05/13/24  Cleotilde Lukes, DO  gabapentin  (NEURONTIN ) 100 MG capsule Please take 100 mg after dialysis on Tuesday, Thursday and Saturday 05/14/23   Cleotilde Lukes, DO  hydrALAZINE  (APRESOLINE ) 100 MG tablet Take 1 tablet (100 mg total) by mouth 3 (three) times daily. 04/04/23   Joshua Domino, DO  hydrOXYzine  (ATARAX ) 10 MG tablet Take 0.5 tablets (5 mg total) by mouth 2 (two) times daily as needed for itching. 07/21/23   Joshua Domino, DO  Methoxy PEG-Epoetin Beta (MIRCERA IJ) Inject 1 Dose into the skin every 14 (fourteen) days. 04/11/23 04/26/24  [provider]  Methoxy PEG-Epoetin Beta (MIRCERA IJ) Mircera 06/18/23 06/16/24  [provider]  minoxidil  (LONITEN ) 10 MG tablet TAKE 1 TABLET BY MOUTH TWICE A DAY 12/03/23   Patwardhan, Manish J, MD  NIFEdipine  (ADALAT  CC) 60 MG 24 hr tablet Take 1 tablet (60 mg total) by mouth daily. 09/25/23   Orlando Pond, DO  pantoprazole  (PROTONIX ) 40 MG tablet Take 1 tablet (40 mg total) by mouth daily. 04/04/23   Joshua Domino, DO  sevelamer  carbonate (RENVELA ) 800 MG tablet Take 2 tablets (1,600 mg total)  by mouth 3 (three) times daily with meals. 05/14/23   Cleotilde Lukes, DO    Allergies: Zestril [lisinopril], Nsaids, and Risperdal  [risperidone ]    Review of Systems  Constitutional:  Positive for fever.  HENT:  Positive for congestion.   Gastrointestinal:  Positive for abdominal pain. Negative for vomiting.  Neurological:  Positive for headaches.  All other systems reviewed and are negative.   Updated Vital Signs BP (!) 226/113 (BP Location: Right Arm)   Pulse (!) 113   Temp 98.8 F (37.1 C) (Oral)   Resp 16   Ht 5' 8 (1.727 m)   Wt 84.3 kg   SpO2 100%   BMI 28.26 kg/m   Physical Exam Vitals and nursing note reviewed.   Constitutional:      General: He is not in acute distress.    Appearance: Normal appearance. He is well-developed. He is not diaphoretic.  HENT:     Head: Normocephalic and atraumatic.     Nose: Nose normal.  Eyes:     Conjunctiva/sclera: Conjunctivae normal.     Pupils: Pupils are equal, round, and reactive to light.  Cardiovascular:     Rate and Rhythm: Normal rate and regular rhythm.     Pulses: Normal pulses.     Heart sounds: Normal heart sounds.  Pulmonary:     Effort: Pulmonary effort is normal.     Breath sounds: Normal breath sounds. No wheezing or rales.  Abdominal:     General: Bowel sounds are normal.     Palpations: Abdomen is soft.     Tenderness: There is no abdominal tenderness. There is no guarding or rebound.  Musculoskeletal:        General: Normal range of motion.     Cervical back: Normal range of motion and neck supple. No rigidity or tenderness.  Lymphadenopathy:     Cervical: No cervical adenopathy.  Skin:    General: Skin is warm and dry.     Capillary Refill: Capillary refill takes less than 2 seconds.  Neurological:     General: No focal deficit present.     Mental Status: [name] and oriented to person, place, and time.  Psychiatric:        Mood and Affect: Mood normal.     (all labs ordered are listed, but only abnormal results are displayed) Results for orders placed or performed during the hospital encounter of 12/26/23  CBC with Differential   Collection Time: 12/26/23  8:36 PM  Result Value Ref Range   WBC 3.8 (L) 4.0 - 10.5 K/uL   RBC 3.33 (L) 4.22 - 5.81 MIL/uL   Hemoglobin 10.1 (L) 13.0 - 17.0 g/dL   HCT 68.4 (L) 60.9 - 47.9 %   MCV 94.6 80.0 - 100.0 fL   MCH 30.3 26.0 - 34.0 pg   MCHC 32.1 30.0 - 36.0 g/dL   RDW 83.3 (H) 88.4 - 84.4 %   Platelets 164 150 - 400 K/uL   nRBC 0.0 0.0 - 0.2 %   Neutrophils Relative % 76 %   Neutro Abs 2.9 1.7 - 7.7 K/uL   Lymphocytes Relative 11 %   Lymphs Abs 0.4 (L) 0.7 - 4.0 K/uL    Monocytes Relative 9 %   Monocytes Absolute 0.4 0.1 - 1.0 K/uL   Eosinophils Relative 3 %   Eosinophils Absolute 0.1 0.0 - 0.5 K/uL   Basophils Relative 1 %   Basophils Absolute 0.0 0.0 - 0.1 K/uL   Immature Granulocytes 0 %  Abs Immature Granulocytes 0.01 0.00 - 0.07 K/uL  Comprehensive metabolic panel   Collection Time: 12/26/23  8:36 PM  Result Value Ref Range   Sodium 138 135 - 145 mmol/L   Potassium 4.1 3.5 - 5.1 mmol/L   Chloride 96 (L) 98 - 111 mmol/L   CO2 28 22 - 32 mmol/L   Glucose, Bld 73 70 - 99 mg/dL   BUN 17 6 - 20 mg/dL   Creatinine, Ser 1.86 (H) 0.61 - 1.24 mg/dL   Calcium  9.3 8.9 - 10.3 mg/dL   Total Protein 7.5 6.5 - 8.1 g/dL   Albumin  3.6 3.5 - 5.0 g/dL   AST 23 15 - 41 U/L   ALT 15 0 - 44 U/L   Alkaline Phosphatase 138 (H) 38 - 126 U/L   Total Bilirubin 1.0 0.0 - 1.2 mg/dL   GFR, Estimated 8 (L) >60 mL/min   Anion gap 14 5 - 15  Lipase, blood   Collection Time: 12/26/23  8:36 PM  Result Value Ref Range   Lipase 34 11 - 51 U/L  CBG monitoring, ED   Collection Time: 12/26/23  9:22 PM  Result Value Ref Range   Glucose-Capillary 85 70 - 99 mg/dL  I-Stat CG4 Lactic Acid   Collection Time: 12/26/23 10:56 PM  Result Value Ref Range   Lactic Acid, Venous 0.5 0.5 - 1.9 mmol/L  Resp panel by RT-PCR (RSV, Flu A&B, Covid) Anterior Nasal Swab   Collection Time: 12/26/23 11:57 PM   Specimen: Anterior Nasal Swab  Result Value Ref Range   SARS Coronavirus 2 by RT PCR POSITIVE (A) NEGATIVE   Influenza A by PCR NEGATIVE NEGATIVE   Influenza B by PCR NEGATIVE NEGATIVE   Resp Syncytial Virus by PCR NEGATIVE NEGATIVE   CT ABDOMEN PELVIS W CONTRAST Addendum Date: 12/27/2023 ADDENDUM REPORT: 12/27/2023 02:24 ADDENDUM: The original exam was ordered as CT abdomen pelvis with IV contrast however due to IV malfunction no contrast was administered. Addendum to add findings after administration of IV contrast. 1. No significant change in findings after the administration of  contrast. 2. Small amount of nonspecific free fluid in the pelvis, favored related to volume status. Electronically Signed   By: Norman Gatlin M.D.   On: 12/27/2023 02:24   Result Date: 12/27/2023 CLINICAL DATA:  Generalized abdominal pain, nausea, vomiting, diarrhea EXAM: CT ABDOMEN AND PELVIS WITH CONTRAST TECHNIQUE: Multidetector CT imaging of the abdomen and pelvis was performed using the standard protocol following bolus administration of intravenous contrast. RADIATION DOSE REDUCTION: This exam was performed according to the departmental dose-optimization program which includes automated exposure control, adjustment of the mA and/or kV according to patient size and/or use of iterative reconstruction technique. CONTRAST:  75mL OMNIPAQUE  IOHEXOL  350 MG/ML SOLN COMPARISON:  CT 09/23/2022 FINDINGS: Lower chest: Similar interstitial edema in the lower lobes. Small pericardial effusion decreased from 09/23/2022. Decreased sensitivity for subtle findings due to paucity of intra-abdominal fat and lack of IV or oral contrast. Hepatobiliary: Unremarkable noncontrast appearance of the liver, gallbladder, and biliary tree. Pancreas: Unremarkable. Spleen: Unremarkable. Adrenals/Urinary Tract: Unremarkable adrenal glands. Atrophic kidneys. No urinary calculi hydronephrosis. Unremarkable bladder. Stomach/Bowel: No definite bowel wall thickening. No obstruction. Tentative visualization of a normal appendix. Vascular/Lymphatic: No definite adenopathy. Reproductive: Unremarkable. Other: Mesenteric edema and small amount of free fluid in the pelvis. The edema and free fluid are similar to decreased compared to 09/23/2022. No free intraperitoneal air. Musculoskeletal: No acute fracture. IMPRESSION: 1. No acute abnormality in the abdomen or  pelvis noting limitation of paucity of intra-abdominal fat and lack of IV or oral contrast. 2. Similar interstitial edema in the lower lobes compared to 09/23/2022. 3. Mesenteric edema and  free fluid in the pelvis have slightly decreased compared to prior. 4. Small pericardial effusion, decreased from prior. Electronically Signed: By: Norman Gatlin M.D. On: 12/27/2023 00:24   DG Chest Portable 1 View Result Date: 12/26/2023 CLINICAL DATA:  Weakness EXAM: PORTABLE CHEST 1 VIEW COMPARISON:  08/30/2023 FINDINGS: Right dialysis catheter remains in place with the tip in the SVC. Heart and mediastinal contours are within normal limits. No focal opacities or effusions. No acute bony abnormality. IMPRESSION: No active cardiopulmonary disease. Electronically Signed   By: Franky Crease M.D.   On: 12/26/2023 21:14    EKG: EKG Interpretation Date/Time:  Saturday December 26 2023 20:16:59 EDT Ventricular Rate:  112 PR Interval:  152 QRS Duration:  80 QT Interval:  354 QTC Calculation: 483 R Axis:   20  Text Interpretation: Sinus tachycardia Left ventricular hypertrophy with repolarization abnormality ( Sokolow-Lyon ) Confirmed by Mana Haberl ([id number]) on 12/26/2023 11:07:45 PM  Radiology: CT ABDOMEN PELVIS W CONTRAST Addendum Date: 12/27/2023 ADDENDUM REPORT: 12/27/2023 02:24 ADDENDUM: The original exam was ordered as CT abdomen pelvis with IV contrast however due to IV malfunction no contrast was administered. Addendum to add findings after administration of IV contrast. 1. No significant change in findings after the administration of contrast. 2. Small amount of nonspecific free fluid in the pelvis, favored related to volume status. Electronically Signed   By: Norman Gatlin M.D.   On: 12/27/2023 02:24   Result Date: 12/27/2023 CLINICAL DATA:  Generalized abdominal pain, nausea, vomiting, diarrhea EXAM: CT ABDOMEN AND PELVIS WITH CONTRAST TECHNIQUE: Multidetector CT imaging of the abdomen and pelvis was performed using the standard protocol following bolus administration of intravenous contrast. RADIATION DOSE REDUCTION: This exam was performed according to the departmental  dose-optimization program which includes automated exposure control, adjustment of the mA and/or kV according to patient size and/or use of iterative reconstruction technique. CONTRAST:  75mL OMNIPAQUE  IOHEXOL  350 MG/ML SOLN COMPARISON:  CT 09/23/2022 FINDINGS: Lower chest: Similar interstitial edema in the lower lobes. Small pericardial effusion decreased from 09/23/2022. Decreased sensitivity for subtle findings due to paucity of intra-abdominal fat and lack of IV or oral contrast. Hepatobiliary: Unremarkable noncontrast appearance of the liver, gallbladder, and biliary tree. Pancreas: Unremarkable. Spleen: Unremarkable. Adrenals/Urinary Tract: Unremarkable adrenal glands. Atrophic kidneys. No urinary calculi hydronephrosis. Unremarkable bladder. Stomach/Bowel: No definite bowel wall thickening. No obstruction. Tentative visualization of a normal appendix. Vascular/Lymphatic: No definite adenopathy. Reproductive: Unremarkable. Other: Mesenteric edema and small amount of free fluid in the pelvis. The edema and free fluid are similar to decreased compared to 09/23/2022. No free intraperitoneal air. Musculoskeletal: No acute fracture. IMPRESSION: 1. No acute abnormality in the abdomen or pelvis noting limitation of paucity of intra-abdominal fat and lack of IV or oral contrast. 2. Similar interstitial edema in the lower lobes compared to 09/23/2022. 3. Mesenteric edema and free fluid in the pelvis have slightly decreased compared to prior. 4. Small pericardial effusion, decreased from prior. Electronically Signed: By: Norman Gatlin M.D. On: 12/27/2023 00:24   DG Chest Portable 1 View Result Date: 12/26/2023 CLINICAL DATA:  Weakness EXAM: PORTABLE CHEST 1 VIEW COMPARISON:  08/30/2023 FINDINGS: Right dialysis catheter remains in place with the tip in the SVC. Heart and mediastinal contours are within normal limits. No focal opacities or effusions. No acute bony abnormality. IMPRESSION: No  active cardiopulmonary  disease. Electronically Signed   By: Franky Crease M.D.   On: 12/26/2023 21:14     .Critical Care  Performed by: Nettie Earing, MD Authorized by: Nettie Earing, MD   Critical care provider statement:    Critical care time (minutes):  30   Critical care end time:  12/27/2023 4:47 AM   Critical care was necessary to treat or prevent imminent or life-threatening deterioration of the following conditions:  Cardiac failure and circulatory failure   Critical care was time spent personally by me on the following activities:  Development of treatment plan with patient or surrogate, discussions with consultants, evaluation of patient's response to treatment, examination of patient, ordering and review of laboratory studies, ordering and review of radiographic studies, ordering and performing treatments and interventions, pulse oximetry, re-evaluation of patient's condition and review of old charts   I assumed direction of critical care for this patient from another provider in my specialty: no     Care discussed with: admitting provider      Medications Ordered in the ED  nicardipine  (CARDENE ) 20mg  in 0.86% saline IV infusion (0.1 mg/ml) (10 mg/hr Intravenous Infusion Verify 12/27/23 0436)  Chlorhexidine  Gluconate Cloth 2 % PADS 6 each (has no administration in time range)  docusate sodium  (COLACE) capsule 100 mg (has no administration in time range)  polyethylene glycol (MIRALAX  / GLYCOLAX ) packet 17 g (has no administration in time range)  heparin  injection 5,000 Units (has no administration in time range)  ondansetron  (ZOFRAN -ODT) disintegrating tablet 4 mg (4 mg Oral Given 12/26/23 2241)  iohexol  (OMNIPAQUE ) 350 MG/ML injection 75 mL (75 mLs Intravenous Contrast Given 12/26/23 2357)  droperidol  (INAPSINE ) 2.5 MG/ML injection 1.25 mg (1.25 mg Intravenous Given 12/27/23 0115)  furosemide  (LASIX ) injection 40 mg (40 mg Intravenous Given 12/27/23 0115)  hydrALAZINE  (APRESOLINE ) injection 5 mg (5  mg Intravenous Given 12/27/23 0239)  metoCLOPramide  (REGLAN ) injection 10 mg (10 mg Intravenous Given 12/27/23 0352)  diphenhydrAMINE  (BENADRYL ) injection 12.5 mg (12.5 mg Intravenous Given 12/27/23 0350)                                    Medical Decision Making Patient ESRD with headache and fever and body aches and abdominal pain   Amount and/or Complexity of Data Reviewed External Data Reviewed: notes.    Details: Previous notes reviewed  Labs: ordered.    Details: White count low 3.8, low hemoglobin 10.1, normal platelets.  Normal sodium 128, normal potassium 4, elevated creatinine 8.13, positive covid  Radiology: ordered and independent interpretation performed.    Details: No SBO by me on CT. Pulmonary edema   Risk Prescription drug management. Decision regarding hospitalization. Risk Details: On cardene  for HTN and also pulmonary edema       Final diagnoses:  COVID-19  Hypertensive crisis  Acute pulmonary edema (HCC)   The patient appears reasonably stabilized for admission considering the current resources, flow, and capabilities available in the ED at this time, and I doubt any other [facility] requiring further screening and/or treatment in the ED prior to admission.  ED Discharge Orders     None          Avis Tirone, MD 12/27/23 [phone]

## 2023-12-27 NOTE — ED Notes (Signed)
 Patient provided with urinal.

## 2023-12-27 NOTE — H&P (Addendum)
 NAME:  [name], MRN:  [id number], DOB:  06-16-1985, LOS: 0 ADMISSION DATE:  12/26/2023, CONSULTATION DATE:  12/27/2023 REFERRING MD: Nettie Earing, MD, CHIEF COMPLAINT: very high BP   History of Present Illness:  A 38 yr old male patient with ESRD on HD due to Alport syndrome, HTN (difficult to control), severe concentric LVH (Echo EF 55% 05/10/2023), GERD, hearing loss, bipolar 1 disorder, depression, and anemia of chronic illness, who presented to ED with body aches, N/V/D, abd pain, SOB, subjective fever, chills, headache, and dizziness for one day. He missed last HD session. Could not keep meds yesterday. Denied CP, URTI symptoms, rash, cough, wheezing, hematemesis, coffee ground vomiting, melena, blood per rectum, and LL edema. He had hypertensive emergency secondary to uncontrolled hypertension, volume overload, left arm pain due to AVF revision 3 weeks ago. He had headache due to Imdur  last admission. He been compliant with outpatient regimen of nifedipine  90 mg twice daily, carvedilol  25 mg twice daily, doxazosin  8 mg daily, losartan  100 mg daily, and hydralazine  100 mg 3 times daily.  He had been unable to tolerate labetalol , clonidine , and minoxidil  (due to some concern recent pericardial effusion was in the setting of minoxidil  use) in the past. Previous chart review indicated renal artery US  was negative for renal artery stenosis. He smokes 2 cigarettes daily. Denied alcohol and illicit drug use.    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 rate 16, height 5' 8 (1.727 m), weight 84.3 kg, SpO2 100%.        Intake/Output Summary (Last 24 hours) at 12/27/2023 0434 Last data filed at 12/27/2023 0418 Gross per 24 hour  Intake 18.2 ml  Output --  Net 18.2 ml   Filed Weights   12/26/23 1959 12/26/23 2000  Weight: 84.3 kg 84.3 kg    Examination: General: alert, oriented x4, and comfortable. On RA. SpO2 99%  HENT: PERL, normal pharynx and oral mucosa. No LNE or thyromegaly. No JVD Lungs: symmetrical air entry bilaterally. No crackles or wheezing Cardiovascular: NL S1/S2. No m/g/r Abdomen: no distension or tenderness Extremities: no edema. 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 -Resume HD  GERD -PPI  Anemia of chronic illness -Monitor  Bipolar 1 disorder, depression -Resume home meds as tolerated   Best Practice (right click and Reselect all SmartList Selections daily)   Diet/type: NPO w/ oral meds DVT prophylaxis prophylactic heparin   Pressure ulcer(s): N/A GI prophylaxis: PPI Lines: N/A Foley:  N/A Code Status:  full code Last date of multidisciplinary goals of care discussion []   Labs   CBC: Recent Labs  Lab 12/26/23 2036  WBC 3.8*  NEUTROABS  2.9  HGB 10.1*  HCT 31.5*  MCV 94.6  PLT 164    Basic Metabolic Panel: Recent Labs  Lab 12/26/23 2036  NA 138  K 4.1  CL 96*  CO2 28  GLUCOSE 73  BUN 17  CREATININE 8.13*  CALCIUM  9.3   GFR: Estimated Creatinine Clearance: 13.3 mL/min (A) (by C-G formula based on SCr of 8.13 mg/dL (H)). Recent  Labs  Lab 12/26/23 2036 12/26/23 2256  WBC 3.8*  --   LATICACIDVEN  --  0.5    Liver Function Tests: Recent Labs  Lab 12/26/23 2036  AST 23  ALT 15  ALKPHOS 138*  BILITOT 1.0  PROT 7.5  ALBUMIN  3.6   Recent Labs  Lab 12/26/23 2036  LIPASE 34   No results for input(s): AMMONIA in the last 168 hours.  ABG    Component Value Date/Time   HCO3 23.0 11/17/2022 0755   TCO2 27 03/27/2023 1838   ACIDBASEDEF 1.0 11/17/2022 0755   O2SAT 92 11/17/2022 0755     Coagulation Profile: No results for input(s): INR, PROTIME in the last 168 hours.  Cardiac Enzymes: No results for input(s): CKTOTAL, CKMB, CKMBINDEX, TROPONINI in the last 168 hours.  HbA1C: Hemoglobin A1C  Date/Time Value Ref Range Status  05/30/2022 02:49 PM 4.6 4.0 - 5.6 % Final  02/11/2021 12:06 PM 5.1 4.0 - 5.6 % Final    CBG: Recent Labs  Lab 12/26/23 2122  GLUCAP 85    Review of Systems:   Review of Systems  Constitutional:  Positive for chills and malaise/fatigue. Negative for diaphoresis and fever.  HENT:  Positive for hearing loss (chronic). Negative for congestion, nosebleeds, sinus pain and sore throat.   Respiratory:  Positive for shortness of breath. Negative for cough, hemoptysis, sputum production, wheezing and stridor.   Cardiovascular:  Negative for chest pain, palpitations, orthopnea, claudication, leg swelling and PND.  Gastrointestinal:  Positive for abdominal pain, diarrhea, nausea and vomiting. Negative for blood in stool, constipation, heartburn and melena.  Genitourinary:  Negative for dysuria.  Musculoskeletal:  Positive for myalgias.  Skin:  Negative for rash.  Neurological:  Positive for dizziness and headaches. Negative for sensory change, speech change and focal weakness.     Past Medical History:  He,  has a past medical history of Alport syndrome, Anemia, Bipolar 1 disorder (HCC), CHF (congestive heart failure) (HCC), Depression, ESRD (end stage renal  disease) on dialysis (HCC), GERD (gastroesophageal reflux disease), Headache, Hearing difficulty of left ear, Hearing disorder of right ear, Heart murmur, Hypertension, Low blood sugar, Marijuana abuse, Noncompliance, Pericardial effusion (03/28/2023), and Tobacco abuse.   Surgical History:   Past Surgical History:  Procedure Laterality Date   A/V FISTULAGRAM Right 10/27/2022   Procedure: A/V Fistulagram;  Surgeon: Eliza Lonni RAMAN, MD;  Location: [facility] INVASIVE CV LAB;  Service: Cardiovascular;  Laterality: Right;   APPENDECTOMY     AV FISTULA PLACEMENT Left 03/03/2019   Procedure: ARTERIOVENOUS (AV) FISTULA CREATION LEFT ARM;  Surgeon: Sheree Penne Lonni, MD;  Location: [facility] OR;  Service: Vascular;  Laterality: Left;   BASCILIC VEIN TRANSPOSITION Right 04/12/2019   Procedure: RIGHT UPPER EXTREMITY BASCILIC VEIN TRANSPOSITION FIRST STAGE FISTULA;  Surgeon: Eliza Lonni RAMAN, MD;  Location: [facility] OR;  Service: Vascular;  Laterality: Right;   BIOPSY  10/14/2021   Procedure: BIOPSY;  Surgeon: Legrand Victory LITTIE DOUGLAS, MD;  Location: [facility] ENDOSCOPY;  Service: Gastroenterology;;   BIOPSY  06/06/2022   Procedure: BIOPSY;  Surgeon: Shila Gustav GAILS, MD;  Location: [facility] ENDOSCOPY;  Service: Gastroenterology;;   BIOPSY  11/12/2022   Procedure: BIOPSY;  Surgeon: Wilhelmenia Aloha Raddle., MD;  Location: [facility] ENDOSCOPY;  Service: Gastroenterology;;   BIOPSY  12/05/2022   Procedure: BIOPSY;  Surgeon: Stacia Glendia BRAVO, MD;  Location: [facility] ENDOSCOPY;  Service: Gastroenterology;;   COLONOSCOPY WITH PROPOFOL  N/A 10/14/2021   Procedure: COLONOSCOPY WITH PROPOFOL ;  Surgeon: Legrand Victory LITTIE DOUGLAS, MD;  Location: [facility] ENDOSCOPY;  Service: Gastroenterology;  Laterality: N/A;   DIALYSIS/PERMA CATHETER INSERTION N/A 04/03/2023   Procedure: DIALYSIS/PERMA CATHETER INSERTION;  Surgeon: Magda Debby SAILOR, MD;  Location: [facility] INVASIVE CV LAB;  Service: Cardiovascular;  Laterality: N/A;   ESOPHAGOGASTRODUODENOSCOPY N/A 11/12/2022    Procedure: ESOPHAGOGASTRODUODENOSCOPY (EGD);  Surgeon: Wilhelmenia Aloha Raddle., MD;  Location: [facility] ENDOSCOPY;  Service: Gastroenterology;  Laterality: N/A;   ESOPHAGOGASTRODUODENOSCOPY N/A 12/05/2022   Procedure: ESOPHAGOGASTRODUODENOSCOPY (EGD);  Surgeon: Stacia Glendia BRAVO, MD;  Location: [facility] ENDOSCOPY;  Service: Gastroenterology;  Laterality: N/A;   ESOPHAGOGASTRODUODENOSCOPY (EGD) WITH PROPOFOL  N/A 06/06/2022   Procedure: ESOPHAGOGASTRODUODENOSCOPY (EGD) WITH PROPOFOL ;  Surgeon: Shila Gustav GAILS, MD;  Location: [facility] ENDOSCOPY;  Service: Gastroenterology;  Laterality: N/A;   FISTULA SUPERFICIALIZATION Right 11/07/2022   Procedure: PLICATION OF LARGE ANEURYSMS OF RIGHT ARTERIOVENOUS FISTULA WITH INSERTION OF 7mm INTERPOSITIONAL GORTEX GRAFT;  Surgeon: Eliza Lonni RAMAN, MD;  Location: [facility] OR;  Service: Vascular;  Laterality: Right;   FLEXIBLE SIGMOIDOSCOPY N/A 04/14/2022   Procedure: FLEXIBLE SIGMOIDOSCOPY;  Surgeon: Shila Gustav GAILS, MD;  Location: [facility] ENDOSCOPY;  Service: Gastroenterology;  Laterality: N/A;   INSERTION OF DIALYSIS CATHETER N/A 11/07/2022   Procedure: INSERTION OF RIGHT INTERNAL JUGULAR TUNNELED DIALYSIS CATHETER;  Surgeon: Eliza Lonni RAMAN, MD;  Location: [facility] OR;  Service: Vascular;  Laterality: N/A;   LIGATION OF ARTERIOVENOUS  FISTULA Left 03/06/2019   Procedure: LIGATION OF ARTERIOVENOUS  FISTULA;  Surgeon: Gretta Lonni PARAS, MD;  Location: [facility] OR;  Service: Vascular;  Laterality: Left;   PERIPHERAL VASCULAR BALLOON ANGIOPLASTY  10/27/2022   Procedure: PERIPHERAL VASCULAR BALLOON ANGIOPLASTY;  Surgeon: Eliza Lonni RAMAN, MD;  Location: [facility] INVASIVE CV LAB;  Service: Cardiovascular;;  rt upper arm fistula   SAVORY DILATION N/A 11/12/2022   Procedure: SAVORY DILATION;  Surgeon: Wilhelmenia Aloha Raddle., MD;  Location: [facility] ENDOSCOPY;  Service: Gastroenterology;  Laterality: N/A;   spinal tap     SPINE SURGERY     related to a spinal infection, unsure of surgery or  infection source   TRANSESOPHAGEAL ECHOCARDIOGRAM (CATH LAB) N/A 04/02/2023   Procedure: TRANSESOPHAGEAL ECHOCARDIOGRAM;  Surgeon: Jeffrie Oneil BROCKS, MD;  Location: [facility] INVASIVE CV LAB;  Service: Cardiovascular;  Laterality: N/A;   WISDOM TOOTH EXTRACTION       Social History:   reports that he has been smoking cigarettes. He has never used smokeless tobacco. He reports that he does not currently use alcohol. He reports current drug use. Drug: Marijuana.   Family History:  His family history includes Asthma in his son; Diabetes in his mother; Hearing loss in his father; Heart disease in his maternal grandmother; Hypertension in his mother and sister; Kidney failure in his mother; Stroke in his maternal grandfather.   Allergies Allergies  Allergen Reactions   Zestril [Lisinopril] Swelling   Nsaids Other (See Comments)    Kidney problems    Risperdal  [Risperidone ] Other (See Comments)    Unknown reaction      Home Medications  Prior to Admission medications   Medication Sig Start Date End Date Taking? Authorizing Provider  adapalene  (ADAPALENE  TREATMENT)  0.1 % gel Apply topically at bedtime. Patient not taking: Reported on 10/22/2023 07/21/23   Joshua Domino, DO  carvedilol  (COREG ) 25 MG tablet TAKE 1 TABLET (25 MG TOTAL) BY MOUTH TWICE A DAY WITH MEALS 09/30/23   Joshua Domino, DO  Cinacalcet HCl (SENSIPAR PO) Take 30 mg by mouth. 10/08/23 10/04/24  [provider]  divalproex  (DEPAKOTE  ER) 500 MG 24 hr tablet Take 1 tablet (500 mg total) by mouth daily. 04/04/23   Joshua Domino, DO  fluticasone  (FLONASE ) 50 MCG/ACT nasal spray Place 1 spray into both nostrils daily. Patient not taking: Reported on 10/22/2023 05/14/23 05/13/24  Cleotilde Lukes, DO  gabapentin  (NEURONTIN ) 100 MG capsule Please take 100 mg after dialysis on Tuesday, Thursday and Saturday 05/14/23   Cleotilde Lukes, DO  hydrALAZINE  (APRESOLINE ) 100 MG tablet Take 1 tablet (100 mg total) by mouth 3 (three) times daily. 04/04/23    Joshua Domino, DO  hydrOXYzine  (ATARAX ) 10 MG tablet Take 0.5 tablets (5 mg total) by mouth 2 (two) times daily as needed for itching. 07/21/23   Joshua Domino, DO  Methoxy PEG-Epoetin Beta (MIRCERA IJ) Inject 1 Dose into the skin every 14 (fourteen) days. 04/11/23 04/26/24  [provider]  Methoxy PEG-Epoetin Beta (MIRCERA IJ) Mircera 06/18/23 06/16/24  [provider]  minoxidil  (LONITEN ) 10 MG tablet TAKE 1 TABLET BY MOUTH TWICE A DAY 12/03/23   Patwardhan, Manish J, MD  NIFEdipine  (ADALAT  CC) 60 MG 24 hr tablet Take 1 tablet (60 mg total) by mouth daily. 09/25/23   Orlando Pond, DO  pantoprazole  (PROTONIX ) 40 MG tablet Take 1 tablet (40 mg total) by mouth daily. 04/04/23   Joshua Domino, DO  sevelamer  carbonate (RENVELA ) 800 MG tablet Take 2 tablets (1,600 mg total) by mouth 3 (three) times daily with meals. 05/14/23   Cleotilde Lukes, DO     Critical care time: 50 min    Mancel Ply, MD [facility] Pager: see AMION

## 2023-12-27 NOTE — Progress Notes (Signed)
 Blood pressure goal not met despite max medication.  Patient reporting 10/10 headache.  PRN medication given with no relief.  CCM physician notified.  Blood pressure repeated in leg with similar readings.

## 2023-12-28 ENCOUNTER — Inpatient Hospital Stay (HOSPITAL_COMMUNITY)

## 2023-12-28 DIAGNOSIS — I421 Obstructive hypertrophic cardiomyopathy: Secondary | ICD-10-CM

## 2023-12-28 DIAGNOSIS — I160 Hypertensive urgency: Secondary | ICD-10-CM | POA: Diagnosis not present

## 2023-12-28 DIAGNOSIS — Z992 Dependence on renal dialysis: Secondary | ICD-10-CM | POA: Diagnosis not present

## 2023-12-28 DIAGNOSIS — N186 End stage renal disease: Secondary | ICD-10-CM | POA: Diagnosis not present

## 2023-12-28 DIAGNOSIS — Q8781 Alport syndrome: Secondary | ICD-10-CM

## 2023-12-28 DIAGNOSIS — U071 COVID-19: Secondary | ICD-10-CM | POA: Insufficient documentation

## 2023-12-28 DIAGNOSIS — I169 Hypertensive crisis, unspecified: Secondary | ICD-10-CM

## 2023-12-28 LAB — ECHOCARDIOGRAM COMPLETE
Area-P 1/2: 3.79 cm2
Height: 68 in
S' Lateral: 3.29 cm
Weight: 2828.94 [oz_av]

## 2023-12-28 MED ORDER — PANTOPRAZOLE SODIUM 40 MG PO TBEC
40.0000 mg | DELAYED_RELEASE_TABLET | Freq: Every day | ORAL | 3 refills | Status: AC
Start: 2023-12-28 — End: ?

## 2023-12-28 MED ORDER — NIFEDIPINE ER 60 MG PO TB24: 60.0000 mg | ORAL_TABLET | Freq: Every day | ORAL | 6 refills | Status: DC

## 2023-12-28 MED ORDER — CARVEDILOL 25 MG PO TABS: 25.0000 mg | ORAL_TABLET | Freq: Two times a day (BID) | ORAL | 6 refills | Status: DC

## 2023-12-28 NOTE — Progress Notes (Addendum)
 0730 patient alert x4  on room air CARDENE  off since 640 am BP WNL. Patient able to make all needs known refused to keep cardiac monitor on with ambulating to bathroom states everyone disconnects it education unsuccessful. Cardiac monitor disconnected by patient to go to bathroom. 1100 patient transported to 34M RN aware

## 2023-12-28 NOTE — Plan of Care (Signed)
  Problem: Education: Goal: Knowledge of risk factors and measures for prevention of condition will improve Outcome: Progressing   Problem: Coping: Goal: Psychosocial and spiritual needs will be supported Outcome: Progressing   Problem: Respiratory: Goal: Will maintain a patent airway Outcome: Progressing Goal: Complications related to the disease process, condition or treatment will be avoided or minimized Outcome: Progressing   Problem: Education: Goal: Knowledge of General Education information will improve Description: Including pain rating scale, medication(s)/side effects and non-pharmacologic comfort measures Outcome: Progressing   Problem: Health Behavior/Discharge Planning: Goal: Ability to manage health-related needs will improve Outcome: Progressing   Problem: Clinical Measurements: Goal: Ability to maintain clinical measurements within normal limits will improve Outcome: Progressing Goal: Will remain free from infection Outcome: Progressing Goal: Diagnostic test results will improve Outcome: Progressing Goal: Respiratory complications will improve Outcome: Progressing Goal: Cardiovascular complication will be avoided Outcome: Progressing   Problem: Activity: Goal: Risk for activity intolerance will decrease Outcome: Progressing   Problem: Nutrition: Goal: Adequate nutrition will be maintained Outcome: Progressing   Problem: Coping: Goal: Level of anxiety will decrease Outcome: Progressing   Problem: Elimination: Goal: Will not experience complications related to bowel motility Outcome: Progressing   Problem: Pain Managment: Goal: General experience of comfort will improve and/or be controlled Outcome: Progressing   Problem: Safety: Goal: Ability to remain free from injury will improve Outcome: Progressing   Problem: Skin Integrity: Goal: Risk for impaired skin integrity will decrease Outcome: Progressing

## 2023-12-28 NOTE — Progress Notes (Signed)
 DISCHARGE NOTE HOME [name] to be discharged Home per MD order. Discussed prescriptions and follow up appointments with the patient. Prescriptions given to patient; medication list explained in detail. Patient verbalized understanding.  Skin clean, dry and intact without evidence of skin break down, no evidence of skin tears noted. IV catheter discontinued intact. Site without signs and symptoms of complications. Dressing and pressure applied. Pt denies pain at the site currently. No complaints noted.  Patient free of lines, drains, and wounds.   An After Visit Summary (AVS) was printed and given to the patient. Patient escorted via wheelchair, and discharged home via private auto.  Doyal Sias, RN

## 2023-12-28 NOTE — Discharge Summary (Addendum)
 Physician Discharge Summary         Patient ID: [name] MRN: [id number] DOB/AGE: 06-22-1985 38 y.o.  Admit date: 12/26/2023 Discharge date: 12/28/2023  Discharge Diagnoses:    Active Hospital Problems   Diagnosis Date Noted   Poorly-controlled hypertension 06/07/2018    Priority: 1.   ESRD on dialysis [facility]) 12/04/2022    Priority: 2.   Alport syndrome 07/14/2017    Priority: 3.   Secondary hyperparathyroidism (HCC) 07/14/2017    Priority: 3.   GERD (gastroesophageal reflux disease) 07/14/2017    Priority: 5.   COVID-19 virus detected 12/28/2023   Hypertensive crisis 05/08/2023   Moderate pericardial effusion 03/28/2023   Anemia of chronic disease 11/12/2022   Heart failure with mildly reduced ejection fraction (HFmrEF) (HCC) 04/08/2022   Hearing loss 10/08/2021   Bipolar 1 disorder (HCC) 09/01/2013    Resolved Hospital Problems   Diagnosis Date Noted Date Resolved   Hypertensive urgency, malignant 12/28/2023 12/28/2023    Priority: 1.      Discharge summary   A 38 yr old male patient with ESRD on HD due to Alport syndrome, HTN (difficult to control), severe concentric LVH (Echo EF 55% 05/10/2023), GERD, hearing loss, bipolar 1 disorder, depression, and anemia of chronic illness, who presented to ED with body aches, N/V/D, abd pain, SOB, subjective fever, chills, headache, and dizziness for one day. He as not missed HD session. Could not keep meds yesterday. Denied CP, URTI symptoms, rash, cough, wheezing, hematemesis, coffee ground vomiting, melena, blood per rectum, and LL edema. He had hypertensive emergency secondary to uncontrolled hypertension, volume overload, left arm pain due to AVF revision 3 weeks ago. He had headache due to Imdur  last admission. He been compliant with outpatient regimen of nifedipine  90 mg twice daily, carvedilol  25 mg twice daily, doxazosin  8 mg daily, losartan  100 mg daily, and hydralazine  100 mg 3 times daily.  He had been unable to  tolerate labetalol , clonidine , and minoxidil  (due to some concern recent pericardial effusion was in the setting of minoxidil  use) in the past. Previous chart review indicated renal artery US  was negative for renal artery stenosis. He smokes 2 cigarettes daily. Denied alcohol and illicit drug use.    Course 8/16: ED evaluation 8/17: ICU admission Started Nicardipine  drip due to hypertensive urgency.  8/18 off gtt after starting POs. Med/chart eval shows he was not taking this coreg  as thought that was the drug causing his headache (it was isosorbide  that caused it) . stable for dc w/ plan for iHD this afternoon. Echo w/ small to moderate pericardial effusion. Instructions for dc reviewed   Discharge Plan by Active Problems    Poorly controlled hypertension/Hypertensive urgency  Severe concentric LVH (Echo EF 55% 05/10/2023) -started on gtt 8/17 home rx also resumed.  Weaned off Cardene  over evening hrs.  Plan Cont home antihypertensives; looks like he had a misunderstanding about the coreg ..He has not been taking it because he thought it was contributing to his HA BUT extensive chart review shows [facility] felt that this was his imdur  that was the contributing factor  home on:  nifedipine  90 mg twice daily, carvedilol  25 mg twice daily, doxazosin  8 mg daily, losartan  100 mg daily, and hydralazine  100 mg 3 times daily.    H/o Pericardial effusion requiring drain in June 2025.  F/u echo:small to mod effusion  Plan F/u w/ cards as planned   COVID infection without hypoxia plan isolation: air borne and contact   Mask, isolation when  able, hand hygiene for next 5 days    ESRD on HD due to Alport syndrome Plan Cont iHD schedule of M/W/F/S   GERD Plan PPI   Anemia of chronic illness Plan Monitor   Bipolar 1 disorder, depression Plan Cont depakote  ER     Significant Hospital tests/ studies  ECHO Procedures   iHD  Consults   nephrology  Discharge Exam: BP (!) 148/92   Pulse  80   Temp 98.5 F (36.9 C) (Oral)   Resp 18   Ht 5' 8 (1.727 m)   Wt 80.2 kg   SpO2 94%   BMI 26.88 kg/m  General this is a 39 year old male he is in no distress HENT NCAT no JVD. Hard of hearing communicated using board  Pulm clear room air no accessory use Card soft systolic HM  Abd soft Ext warm dry LUE fistula w/ good bruit and thrill. No edema  Neuro intact   Labs at discharge   Lab Results  Component Value Date   CREATININE 8.13 (H) 12/26/2023   BUN 17 12/26/2023   NA 138 12/26/2023   K 4.1 12/26/2023   CL 96 (L) 12/26/2023   CO2 28 12/26/2023   Lab Results  Component Value Date   WBC 3.8 (L) 12/26/2023   HGB 10.1 (L) 12/26/2023   HCT 31.5 (L) 12/26/2023   MCV 94.6 12/26/2023   PLT 164 12/26/2023   Lab Results  Component Value Date   ALT 15 12/26/2023   AST 23 12/26/2023   ALKPHOS 138 (H) 12/26/2023   BILITOT 1.0 12/26/2023   Lab Results  Component Value Date   INR 1.2 03/27/2023    Current radiological studies    CT ABDOMEN PELVIS W CONTRAST Addendum Date: 12/27/2023 ADDENDUM REPORT: 12/27/2023 02:24 ADDENDUM: The original exam was ordered as CT abdomen pelvis with IV contrast however due to IV malfunction no contrast was administered. Addendum to add findings after administration of IV contrast. 1. No significant change in findings after the administration of contrast. 2. Small amount of nonspecific free fluid in the pelvis, favored related to volume status. Electronically Signed   By: Norman Gatlin M.D.   On: 12/27/2023 02:24   Result Date: 12/27/2023 CLINICAL DATA:  Generalized abdominal pain, nausea, vomiting, diarrhea EXAM: CT ABDOMEN AND PELVIS WITH CONTRAST TECHNIQUE: Multidetector CT imaging of the abdomen and pelvis was performed using the standard protocol following bolus administration of intravenous contrast. RADIATION DOSE REDUCTION: This exam was performed according to the departmental dose-optimization program which includes automated  exposure control, adjustment of the mA and/or kV according to patient size and/or use of iterative reconstruction technique. CONTRAST:  75mL OMNIPAQUE  IOHEXOL  350 MG/ML SOLN COMPARISON:  CT 09/23/2022 FINDINGS: Lower chest: Similar interstitial edema in the lower lobes. Small pericardial effusion decreased from 09/23/2022. Decreased sensitivity for subtle findings due to paucity of intra-abdominal fat and lack of IV or oral contrast. Hepatobiliary: Unremarkable noncontrast appearance of the liver, gallbladder, and biliary tree. Pancreas: Unremarkable. Spleen: Unremarkable. Adrenals/Urinary Tract: Unremarkable adrenal glands. Atrophic kidneys. No urinary calculi hydronephrosis. Unremarkable bladder. Stomach/Bowel: No definite bowel wall thickening. No obstruction. Tentative visualization of a normal appendix. Vascular/Lymphatic: No definite adenopathy. Reproductive: Unremarkable. Other: Mesenteric edema and small amount of free fluid in the pelvis. The edema and free fluid are similar to decreased compared to 09/23/2022. No free intraperitoneal air. Musculoskeletal: No acute fracture. IMPRESSION: 1. No acute abnormality in the abdomen or pelvis noting limitation of paucity of intra-abdominal fat and lack  of IV or oral contrast. 2. Similar interstitial edema in the lower lobes compared to 09/23/2022. 3. Mesenteric edema and free fluid in the pelvis have slightly decreased compared to prior. 4. Small pericardial effusion, decreased from prior. Electronically Signed: By: Norman Gatlin M.D. On: 12/27/2023 00:24   DG Chest Portable 1 View Result Date: 12/26/2023 CLINICAL DATA:  Weakness EXAM: PORTABLE CHEST 1 VIEW COMPARISON:  08/30/2023 FINDINGS: Right dialysis catheter remains in place with the tip in the SVC. Heart and mediastinal contours are within normal limits. No focal opacities or effusions. No acute bony abnormality. IMPRESSION: No active cardiopulmonary disease. Electronically Signed   By: Franky Crease  M.D.   On: 12/26/2023 21:14    Disposition:    Discharge disposition: 01-Home or Self Care       Discharge Instructions     Diet - low sodium heart healthy   Complete by: As directed    Increase activity slowly   Complete by: As directed        Allergies as of 12/28/2023       Reactions   Nsaids Other (See Comments)   Contraindication due to ckd    Zestril [lisinopril] Swelling   Risperdal  [risperidone ] Other (See Comments)   Unknown reaction         Medication List     STOP taking these medications    minoxidil  10 MG tablet Commonly known as: LONITEN    SENSIPAR PO       TAKE these medications    carvedilol  25 MG tablet Commonly known as: COREG  Take 1 tablet (25 mg total) by mouth 2 (two) times daily with a meal. What changed: See the new instructions.   divalproex  500 MG 24 hr tablet Commonly known as: DEPAKOTE  ER Take 1 tablet (500 mg total) by mouth daily.   doxazosin  8 MG tablet Commonly known as: CARDURA  Take 1 tablet by mouth daily.   gabapentin  100 MG capsule Commonly known as: Neurontin  Please take 100 mg after dialysis on Tuesday, Thursday and Saturday   hydrALAZINE  100 MG tablet Commonly known as: APRESOLINE  Take 1 tablet (100 mg total) by mouth 3 (three) times daily.   hydrOXYzine  10 MG tablet Commonly known as: ATARAX  Take 0.5 tablets (5 mg total) by mouth 2 (two) times daily as needed for itching.   losartan  100 MG tablet Commonly known as: COZAAR  Take 1 tablet by mouth daily.   NIFEdipine  90 MG 24 hr tablet Commonly known as: ADALAT  CC Take 90 mg by mouth in the morning and at bedtime. What changed: Another medication with the same name was removed. Continue taking this medication, and follow the directions you see here.   pantoprazole  40 MG tablet Commonly known as: PROTONIX  Take 1 tablet (40 mg total) by mouth daily.   sevelamer  carbonate 800 MG tablet Commonly known as: RENVELA  Take 2 tablets (1,600 mg total) by  mouth 3 (three) times daily with meals.         Follow-up appointment   IHD today  Discharge Condition:    good  Physician Statement:   The Patient was personally examined, the discharge assessment and plan has been personally reviewed and I agree with ACNP Tyrie Porzio's assessment and plan. 40 minutes of time have been dedicated to discharge assessment, planning and discharge instructions.   Signed: Jeralyn FORBES Banner 12/28/2023, 1:50 PM

## 2023-12-28 NOTE — Plan of Care (Signed)
 [facility] Patient Discharge Orders- [facility] CLINIC: [place]  Patient's name: [name] Admit/DC Dates: 12/26/2023 - 12/28/2023  Discharge Diagnoses: Hypertensive urgency. Resume home meds. COVID-19 infection. Supportive management.    Outpatient Dialysis Orders:  Patient did not receive dialysis during this admission. No change to outpatient orders.   Recent Labs  Lab 12/26/23 2036  HGB 10.1*  K 4.1  CALCIUM  9.3  ALBUMIN  3.6    Access intervention/Change: --      Maisie Ronnald Acosta PA-C   D/C Meds to be reconciled by nurse after every discharge.    Reviewed by: MD:______ RN_______

## 2023-12-28 NOTE — Progress Notes (Addendum)
 Asked to see for ESRD.  Patient seen in room. BP's are reasonably well controlled, keeping his medications down. States he goes to all his HD sessions (4x/wk) and takes all his BP meds. Wasn't able to keep meds down likely led this admission for uncont HTN emergency. Was rx'd w/ IV antihypertensive gtt and is back on po meds now. He doesn't overdrink anymore so they are pulling only 1.5 L when he goes to dialysis. His exam is normal, he looks great, no vol overload, RIJ TDC, maturing access LU arm. He tested + for COVID here. We will not be able to dialyze him today, likely would be tomorrow given that he can't be done upstairs. I think best route is to d/c him as soon as possible and he will go to his outpatient HD unit this afternoon to get HD on his usual schedule. Have d/w CCM, cont current HD BP medication regimen. CCM agrees w/ d/c home and he will go to HD either this afternoon or in the morning tomorrow. Pt has spoken with his unit who are aware and accepting of HD today or in the morning tomorrow.   Myer Fret  MD  [facility] 12/28/2023, 1:12 PM  Recent Labs  Lab 12/26/23 2036  HGB 10.1*  ALBUMIN  3.6  CALCIUM  9.3  CREATININE 8.13*  K 4.1    Inpatient medications:  carvedilol   25 mg Oral BID WC   Chlorhexidine  Gluconate Cloth  6 each Topical Daily   divalproex   500 mg Oral Daily   doxazosin   8 mg Oral Daily   [START ON 12/29/2023] gabapentin   100 mg Oral Q T,Th,Sat-1800   heparin   5,000 Units Subcutaneous Q12H   hydrALAZINE   100 mg Oral TID   losartan   100 mg Oral Daily   NIFEdipine   90 mg Oral BID   pantoprazole   40 mg Oral Daily   sevelamer  carbonate  1,600 mg Oral TID WC    acetaminophen , calcium  carbonate, docusate sodium , hydrOXYzine , labetalol , ondansetron  (ZOFRAN ) IV, polyethylene glycol

## 2023-12-28 NOTE — Care Management Obs Status (Deleted)
 MEDICARE OBSERVATION STATUS NOTIFICATION   Patient Details  Name: ERICO STAN MRN: [id number] Date of Birth: Oct 14, 1985   Medicare Observation Status Notification Given:  Yes    Tom-Johnson, Harvest Muskrat, RN 12/28/2023, 1:54 PM

## 2023-12-28 NOTE — Discharge Instructions (Addendum)
 Take the medications as instructed.   Special note: the doctors at [facility] felt your head ache was from the isosorbide  (Imdur ) not the coreg .  You had been receiving the coreg  during your entire stay at [facility].   Continue masking and isolation as able for next 5 days

## 2023-12-28 NOTE — Progress Notes (Signed)
 Echocardiogram 2D Echocardiogram has been performed.  Tinnie FORBES Gosling RDCS 12/28/2023, 10:05 AM

## 2023-12-28 NOTE — Progress Notes (Addendum)
 NAME:  [name], MRN:  [id number], DOB:  [birth year], LOS: 1 ADMISSION DATE:  12/26/2023, CONSULTATION DATE:  12/27/2023 REFERRING MD: Nettie Earing, MD, CHIEF COMPLAINT: very high BP   History of Present Illness:  A 38 yr old male patient with ESRD on HD due to Alport syndrome, HTN (difficult to control), severe concentric LVH (Echo EF 55% 05/10/2023), GERD, hearing loss, bipolar 1 disorder, depression, and anemia of chronic illness, who presented to ED with body aches, N/V/D, abd pain, SOB, subjective fever, chills, headache, and dizziness for one day. He missed last HD session. Could not keep meds yesterday. Denied CP, URTI symptoms, rash, cough, wheezing, hematemesis, coffee ground vomiting, melena, blood per rectum, and LL edema. He had hypertensive emergency secondary to uncontrolled hypertension, volume overload, left arm pain due to AVF revision 3 weeks ago. He had headache due to Imdur  last admission. He been compliant with outpatient regimen of nifedipine  90 mg twice daily, carvedilol  25 mg twice daily, doxazosin  8 mg daily, losartan  100 mg daily, and hydralazine  100 mg 3 times daily.  He had been unable to tolerate labetalol , clonidine , and minoxidil  (due to some concern recent pericardial effusion was in the setting of minoxidil  use) in the past. Previous chart review indicated renal artery US  was negative for renal artery stenosis. He smokes 2 cigarettes daily. Denied alcohol and illicit drug use.    Pertinent  Medical History  Migraines, ESRD on HD due to Alport syndrome, HTN (difficult to control), severe concentric LVH, GERD, anemia of chronic illness, bipolar 1 disorder, depression  Significant Hospital Events: Including procedures, antibiotic start and stop dates in addition to other pertinent events   8/16: ED evaluation 8/17: ICU admission Started Nicardipine  drip due to hypertensive urgency  8/18 stable for dc after iHD and ECHO  Interim History / Subjective:  HA resolved    Objective    Blood pressure (!) 152/86, pulse 93, temperature 98.5 F (36.9 C), temperature source Oral, resp. rate 14, height 5' 8 (1.727 m), weight 76.2 kg, SpO2 93%.        Intake/Output Summary (Last 24 hours) at 12/28/2023 0817 Last data filed at 12/28/2023 0700 Gross per 24 hour  Intake 1822.17 ml  Output --  Net 1822.17 ml   Filed Weights   12/26/23 1959 12/26/23 2000 12/27/23 0701  Weight: 84.3 kg 84.3 kg 76.2 kg    Examination: General this is a 38 year old male he is in no distress HENT NCAT no JVD. Hard of hearing communicated using board  Pulm clear room air no accessory use Card soft systolic HM  Abd soft Ext warm dry LUE fistula w/ good bruit and thrill. No edema  Neuro intact    Resolved problem list   Assessment and Plan  Poorly controlled hypertension/Hypertensive urgency  Severe concentric LVH (Echo EF 55% 05/10/2023) -started on gtt 8/17 home rx also resumed.  Weaned off Cardene  over evening hrs.  Plan Goal sbp <165; ideally can start trending back towards 140 (slowly w/ PO and iHD regimen) Cont home antihypertensives; looks like he had a misunderstanding about the coreg ..He has not been taking it because he thought it was contributing to his HA BUT extensive chart review shows [facility] felt that this was his imdur  that was the contributing factor  Ensure he is seen by nephro will need another round of iHD prior to dc home on f nifedipine  90 mg twice daily, carvedilol  25 mg twice daily, doxazosin  8 mg daily, losartan  100 mg  daily, and hydralazine  100 mg 3 times daily.  F/u echo had pericardial effusion w/ drain placement back in June   COVID infection without hypoxia plan isolation: air borne and contact     ESRD on HD due to Alport syndrome Plan Nephro consult  GERD Plan PPI  Anemia of chronic illness Plan Monitor  Bipolar 1 disorder, depression Plan Cont depakote  ER  Best Practice (right click and Reselect all SmartList Selections  daily)   Diet/type: NPO w/ oral meds DVT prophylaxis prophylactic heparin   Pressure ulcer(s): N/A GI prophylaxis: PPI Lines: N/A Foley:  N/A Code Status:  full code Last date of multidisciplinary goals of care discussion []    Critical care time: NA      Attending note:  Patient seen, examined and discussed with advanced nurse practitioner  38 year old male with end-stage renal disease on hemodialysis and hearing difficulty due to Alport syndrome presented with hypertensive urgency  Blood pressure is better controlled He is off Cardene  Feeling better Continue oral antihypertensive meds including nifedipine  twice daily, carvedilol , doxazosin , losartan  and hydralazine   He is due for hemodialysis, will ask nephrology consult  He has COVID, continue airborne isolation  Transfer to medical floor  Valinda Novas, MD

## 2023-12-28 NOTE — Care Management CC44 (Deleted)
 Condition Code 44 Documentation Completed  Patient Details  Name: [name] MRN: [id number] Date of Birth: January 24, 1986   Condition Code 44 given:  Yes Patient signature on Condition Code 44 notice:  Yes Documentation of 2 MD's agreement:  Yes Code 44 added to claim:  Yes    Tom-Johnson, Harvest Muskrat, RN 12/28/2023, 1:54 PM

## 2023-12-28 NOTE — TOC Transition Note (Signed)
 Transition of Care [facility]) - Discharge Note   Patient Details  Name: [name] MRN: [id number] Date of Birth: [birth year]  Transition of [facility] CM/SW Contact:  Tom-Johnson, Harvest Muskrat, RN Phone Number: 12/28/2023, 1:56 PM   Clinical Narrative:     Patient is scheduled for discharge today.  Readmission Risk Assessment done. Hospital f/u and discharge instructions on AVS. No TOC needs or recommendations noted. Patient drove self to the ED and will transport self at discharge.  No further TOC needs noted.        Final next level of care: Home/Self Care Barriers to Discharge: Barriers Resolved   Patient Goals and CMS Choice Patient states their goals for this hospitalization and ongoing recovery are:: To return home CMS Medicare.gov Compare Post Acute Care list provided to:: Patient Choice offered to / list presented to : NA      Discharge Placement                Patient to be transferred to facility by: Self      Discharge Plan and Services Additional resources added to the After Visit Summary for                  DME Arranged: N/A DME Agency: NA       HH Arranged: NA HH Agency: NA        Social Drivers of Health (SDOH) Interventions SDOH Screenings   Food Insecurity: No Food Insecurity (12/28/2023)  Housing: Low Risk  (12/28/2023)  Transportation Needs: No Transportation Needs (12/28/2023)  Utilities: Not At Risk (12/28/2023)  Depression (PHQ2-9): Medium Risk (06/02/2023)  Physical Activity: Inactive (06/02/2023)  Social Connections: Unknown (09/20/2021)   Received from [facility]  Stress: Stress Concern Present (06/02/2023)  Tobacco Use: High Risk (12/26/2023)     Readmission Risk Interventions    12/28/2023    1:41 PM  Readmission Risk Prevention Plan  Transportation Screening Complete  PCP or Specialist Appt within 3-5 Days Complete  HRI or Home Care Consult Complete  Social Work Consult for Recovery Care Planning/Counseling  Complete  Palliative Care Screening Not Applicable  Medication Review [employer]) Referral to Pharmacy

## 2023-12-31 LAB — CULTURE, BLOOD (ROUTINE X 2)
Culture: NO GROWTH
Culture: NO GROWTH
Special Requests: ADEQUATE

## 2024-01-14 ENCOUNTER — Ambulatory Visit (INDEPENDENT_AMBULATORY_CARE_PROVIDER_SITE_OTHER): Admitting: Student

## 2024-01-14 VITALS — BP 191/103 | HR 95 | Ht 68.0 in | Wt 169.4 lb

## 2024-01-14 DIAGNOSIS — M25562 Pain in left knee: Secondary | ICD-10-CM

## 2024-01-14 DIAGNOSIS — M79672 Pain in left foot: Secondary | ICD-10-CM

## 2024-01-14 DIAGNOSIS — M79671 Pain in right foot: Secondary | ICD-10-CM | POA: Diagnosis not present

## 2024-01-14 DIAGNOSIS — R2689 Other abnormalities of gait and mobility: Secondary | ICD-10-CM

## 2024-01-14 DIAGNOSIS — M25561 Pain in right knee: Secondary | ICD-10-CM

## 2024-01-14 DIAGNOSIS — M25572 Pain in left ankle and joints of left foot: Secondary | ICD-10-CM

## 2024-01-14 DIAGNOSIS — M25571 Pain in right ankle and joints of right foot: Secondary | ICD-10-CM

## 2024-01-14 DIAGNOSIS — G8929 Other chronic pain: Secondary | ICD-10-CM

## 2024-01-14 NOTE — Progress Notes (Signed)
    SUBJECTIVE:   CHIEF COMPLAINT / HPI:   Bilateral knee, ankle, foot pain Ongoing for at least a year, worsening.  Worsening over the last several weeks, to the point that he is having to take small steps and limping due to right sided pain worse than left.  Interestingly he reports pain is worse when he coughs.  Denies incontinence, weakness, numbness.  Pain is difficult to describe, sometimes throbbing sometimes aching.  He states he thinks he  broke my foot but is unsure if he had a traumatic injury or not.  Denies any swelling or bruising.  Reports he is to be very active at the teenager and sports, is concerned he cannot keep up with his children at home.  Hypertension Patient is not having any headaches, vision changes, chest pain, or any other symptoms.  His primary concern is pain above.  He took all of his blood pressure medications today.  He has known difficult to control blood pressure related to his ESRD.   PERTINENT  PMH / PSH: ESRD on HD (M/W/F/S), poorly controlled hypertension, bipolar 1 disorder, depression, anemia of chronic illness  OBJECTIVE:   BP (!) 191/103   Pulse 95   Ht 5' 8 (1.727 m)   Wt 169 lb 6.4 oz (76.8 kg)   SpO2 100%   BMI 25.76 kg/m    General: NAD, pleasant.  Cardio: RRR, no MRG. Cap Refill <2s. Respiratory: CTAB, normal wob on RA Skin: Warm and dry Bilateral low back, knee, ankle, feet: No gross deformity, no ecchymosis, no swelling.  TTP over dorsal/plantar metatarsals of both feet.  5/5 strength, normal sensation bilaterally. Straight leg negative FADIR/FABER negative for intra-articular process Noticeable abnormal gait, short steps and slight limp on right  ASSESSMENT/PLAN:   Assessment & Plan Chronic pain of both knees Chronic pain of both ankles Bilateral foot pain Limping Differential is broad includes: Osteoarthritis, tendinopathy, fracture (at greater risk given ESRD).  Low concern for gout given symmetry, no swelling.  Low  concern for hip involvement with negative exam, but does have limp on right.  Low concern for spinal cord involvement, no back pain/neurologic symptoms to suggest spinal cord involvement. - Obtain plain films bilateral knee, ankle, foot and pelvis (look for hip pathology) - Will review images and schedule appointment for follow-up   Gladis Church, DO [facility]

## 2024-01-14 NOTE — Patient Instructions (Signed)
 It was great to see you! Thank you for allowing me to participate in your care!   I recommend that you always bring your medications to each appointment as this makes it easy to ensure we are on the correct medications and helps us  not miss when refills are needed.  Our plans for today:   An x-ray was ordered for you---you do not need an appointment to have this completed.  I recommend going to [facility] [address] [place]   If the results are normal,I will send you a letter  I will call you with results if anything is abnormal    Take care and seek immediate care sooner if you develop any concerns. Please remember to show up 15 minutes before your scheduled appointment time!  Gladis Church, DO [facility]

## 2024-01-19 ENCOUNTER — Ambulatory Visit
Admission: RE | Admit: 2024-01-19 | Discharge: 2024-01-19 | Disposition: A | Discharge status: Home / Self Care | Source: Ambulatory Visit | Attending: Family Medicine

## 2024-01-19 DIAGNOSIS — G8929 Other chronic pain: Secondary | ICD-10-CM

## 2024-01-19 DIAGNOSIS — R2689 Other abnormalities of gait and mobility: Secondary | ICD-10-CM

## 2024-01-19 DIAGNOSIS — M79671 Pain in right foot: Secondary | ICD-10-CM

## 2024-01-20 ENCOUNTER — Inpatient Hospital Stay (HOSPITAL_COMMUNITY)
Admission: EM | Admit: 2024-01-20 | Discharge: 2024-02-10 | DRG: 304 | Disposition: A | Discharge status: Home / Self Care | Attending: Internal Medicine | Admitting: Internal Medicine

## 2024-01-20 ENCOUNTER — Emergency Department (HOSPITAL_COMMUNITY)

## 2024-01-20 DIAGNOSIS — Z8249 Family history of ischemic heart disease and other diseases of the circulatory system: Secondary | ICD-10-CM

## 2024-01-20 DIAGNOSIS — T8241XA Breakdown (mechanical) of vascular dialysis catheter, initial encounter: Secondary | ICD-10-CM | POA: Diagnosis present

## 2024-01-20 DIAGNOSIS — Z886 Allergy status to analgesic agent status: Secondary | ICD-10-CM

## 2024-01-20 DIAGNOSIS — F319 Bipolar disorder, unspecified: Secondary | ICD-10-CM | POA: Diagnosis present

## 2024-01-20 DIAGNOSIS — D631 Anemia in chronic kidney disease: Secondary | ICD-10-CM | POA: Diagnosis present

## 2024-01-20 DIAGNOSIS — Y718 Miscellaneous cardiovascular devices associated with adverse incidents, not elsewhere classified: Secondary | ICD-10-CM | POA: Diagnosis present

## 2024-01-20 DIAGNOSIS — E875 Hyperkalemia: Secondary | ICD-10-CM | POA: Diagnosis not present

## 2024-01-20 DIAGNOSIS — N2581 Secondary hyperparathyroidism of renal origin: Secondary | ICD-10-CM | POA: Diagnosis present

## 2024-01-20 DIAGNOSIS — N186 End stage renal disease: Secondary | ICD-10-CM | POA: Diagnosis present

## 2024-01-20 DIAGNOSIS — R5381 Other malaise: Secondary | ICD-10-CM | POA: Diagnosis present

## 2024-01-20 DIAGNOSIS — Z9889 Other specified postprocedural states: Secondary | ICD-10-CM

## 2024-01-20 DIAGNOSIS — Y832 Surgical operation with anastomosis, bypass or graft as the cause of abnormal reaction of the patient, or of later complication, without mention of misadventure at the time of the procedure: Secondary | ICD-10-CM | POA: Diagnosis present

## 2024-01-20 DIAGNOSIS — Z8419 Family history of other disorders of kidney and ureter: Secondary | ICD-10-CM

## 2024-01-20 DIAGNOSIS — T7849XA Other allergy, initial encounter: Secondary | ICD-10-CM | POA: Diagnosis not present

## 2024-01-20 DIAGNOSIS — I169 Hypertensive crisis, unspecified: Principal | ICD-10-CM | POA: Diagnosis present

## 2024-01-20 DIAGNOSIS — A4189 Other specified sepsis: Secondary | ICD-10-CM | POA: Diagnosis not present

## 2024-01-20 DIAGNOSIS — Z992 Dependence on renal dialysis: Secondary | ICD-10-CM

## 2024-01-20 DIAGNOSIS — R7881 Bacteremia: Secondary | ICD-10-CM | POA: Diagnosis present

## 2024-01-20 DIAGNOSIS — R079 Chest pain, unspecified: Secondary | ICD-10-CM | POA: Diagnosis not present

## 2024-01-20 DIAGNOSIS — F17290 Nicotine dependence, other tobacco product, uncomplicated: Secondary | ICD-10-CM | POA: Diagnosis present

## 2024-01-20 DIAGNOSIS — E8779 Other fluid overload: Secondary | ICD-10-CM | POA: Diagnosis not present

## 2024-01-20 DIAGNOSIS — Z716 Tobacco abuse counseling: Secondary | ICD-10-CM

## 2024-01-20 DIAGNOSIS — R066 Hiccough: Secondary | ICD-10-CM | POA: Diagnosis present

## 2024-01-20 DIAGNOSIS — T827XXA Infection and inflammatory reaction due to other cardiac and vascular devices, implants and grafts, initial encounter: Secondary | ICD-10-CM | POA: Diagnosis not present

## 2024-01-20 DIAGNOSIS — H903 Sensorineural hearing loss, bilateral: Secondary | ICD-10-CM | POA: Diagnosis present

## 2024-01-20 DIAGNOSIS — K219 Gastro-esophageal reflux disease without esophagitis: Secondary | ICD-10-CM | POA: Diagnosis present

## 2024-01-20 DIAGNOSIS — L299 Pruritus, unspecified: Secondary | ICD-10-CM | POA: Diagnosis not present

## 2024-01-20 DIAGNOSIS — B9689 Other specified bacterial agents as the cause of diseases classified elsewhere: Secondary | ICD-10-CM | POA: Diagnosis present

## 2024-01-20 DIAGNOSIS — F1210 Cannabis abuse, uncomplicated: Secondary | ICD-10-CM | POA: Diagnosis present

## 2024-01-20 DIAGNOSIS — T82511D Breakdown (mechanical) of surgically created arteriovenous shunt, subsequent encounter: Secondary | ICD-10-CM | POA: Diagnosis not present

## 2024-01-20 DIAGNOSIS — E162 Hypoglycemia, unspecified: Secondary | ICD-10-CM | POA: Diagnosis not present

## 2024-01-20 DIAGNOSIS — T82898A Other specified complication of vascular prosthetic devices, implants and grafts, initial encounter: Secondary | ICD-10-CM | POA: Diagnosis present

## 2024-01-20 DIAGNOSIS — I3139 Other pericardial effusion (noninflammatory): Secondary | ICD-10-CM | POA: Diagnosis present

## 2024-01-20 DIAGNOSIS — Q8781 Alport syndrome: Secondary | ICD-10-CM

## 2024-01-20 DIAGNOSIS — I509 Heart failure, unspecified: Secondary | ICD-10-CM | POA: Diagnosis not present

## 2024-01-20 DIAGNOSIS — R159 Full incontinence of feces: Secondary | ICD-10-CM | POA: Diagnosis present

## 2024-01-20 DIAGNOSIS — Z79899 Other long term (current) drug therapy: Secondary | ICD-10-CM

## 2024-01-20 DIAGNOSIS — I132 Hypertensive heart and chronic kidney disease with heart failure and with stage 5 chronic kidney disease, or end stage renal disease: Secondary | ICD-10-CM | POA: Diagnosis present

## 2024-01-20 DIAGNOSIS — I1A0 Resistant hypertension: Secondary | ICD-10-CM | POA: Diagnosis present

## 2024-01-20 DIAGNOSIS — Z833 Family history of diabetes mellitus: Secondary | ICD-10-CM

## 2024-01-20 DIAGNOSIS — Z9862 Peripheral vascular angioplasty status: Secondary | ICD-10-CM

## 2024-01-20 DIAGNOSIS — R197 Diarrhea, unspecified: Secondary | ICD-10-CM | POA: Diagnosis not present

## 2024-01-20 DIAGNOSIS — I161 Hypertensive emergency: Secondary | ICD-10-CM | POA: Diagnosis present

## 2024-01-20 DIAGNOSIS — Z9049 Acquired absence of other specified parts of digestive tract: Secondary | ICD-10-CM

## 2024-01-20 DIAGNOSIS — Z823 Family history of stroke: Secondary | ICD-10-CM

## 2024-01-20 DIAGNOSIS — T82838A Hemorrhage of vascular prosthetic devices, implants and grafts, initial encounter: Secondary | ICD-10-CM | POA: Diagnosis not present

## 2024-01-20 DIAGNOSIS — Z822 Family history of deafness and hearing loss: Secondary | ICD-10-CM

## 2024-01-20 DIAGNOSIS — J9811 Atelectasis: Secondary | ICD-10-CM | POA: Diagnosis present

## 2024-01-20 DIAGNOSIS — L03114 Cellulitis of left upper limb: Secondary | ICD-10-CM | POA: Diagnosis not present

## 2024-01-20 DIAGNOSIS — I517 Cardiomegaly: Secondary | ICD-10-CM | POA: Diagnosis not present

## 2024-01-20 DIAGNOSIS — T82848A Pain from vascular prosthetic devices, implants and grafts, initial encounter: Secondary | ICD-10-CM | POA: Diagnosis not present

## 2024-01-20 DIAGNOSIS — Z825 Family history of asthma and other chronic lower respiratory diseases: Secondary | ICD-10-CM

## 2024-01-20 DIAGNOSIS — Z91158 Patient's noncompliance with renal dialysis for other reason: Secondary | ICD-10-CM

## 2024-01-20 DIAGNOSIS — Z8616 Personal history of COVID-19: Secondary | ICD-10-CM

## 2024-01-20 DIAGNOSIS — R739 Hyperglycemia, unspecified: Secondary | ICD-10-CM | POA: Diagnosis not present

## 2024-01-20 DIAGNOSIS — F17210 Nicotine dependence, cigarettes, uncomplicated: Secondary | ICD-10-CM | POA: Diagnosis present

## 2024-01-20 DIAGNOSIS — R0789 Other chest pain: Secondary | ICD-10-CM | POA: Diagnosis not present

## 2024-01-20 DIAGNOSIS — I5032 Chronic diastolic (congestive) heart failure: Secondary | ICD-10-CM | POA: Diagnosis present

## 2024-01-20 DIAGNOSIS — Z888 Allergy status to other drugs, medicaments and biological substances status: Secondary | ICD-10-CM

## 2024-01-20 LAB — COMPREHENSIVE METABOLIC PANEL WITH GFR
ALT: 13 U/L (ref 0–44)
AST: 26 U/L (ref 15–41)
Albumin: 2.8 g/dL — ABNORMAL LOW (ref 3.5–5.0)
Alkaline Phosphatase: 139 U/L — ABNORMAL HIGH (ref 38–126)
Anion gap: 17 — ABNORMAL HIGH (ref 5–15)
BUN: 54 mg/dL — ABNORMAL HIGH (ref 6–20)
CO2: 22 mmol/L (ref 22–32)
Calcium: 8.9 mg/dL (ref 8.9–10.3)
Chloride: 96 mmol/L — ABNORMAL LOW (ref 98–111)
Creatinine, Ser: 16.1 mg/dL — ABNORMAL HIGH (ref 0.61–1.24)
GFR, Estimated: 4 mL/min — ABNORMAL LOW (ref >60)
Glucose, Bld: 107 mg/dL — ABNORMAL HIGH (ref 70–99)
Potassium: 4.6 mmol/L (ref 3.5–5.1)
Sodium: 135 mmol/L (ref 135–145)
Total Bilirubin: 0.9 mg/dL (ref 0.0–1.2)
Total Protein: 6.7 g/dL (ref 6.5–8.1)

## 2024-01-20 LAB — TROPONIN I (HIGH SENSITIVITY): Troponin I (High Sensitivity): 24 ng/L — ABNORMAL HIGH (ref <18)

## 2024-01-20 LAB — CBC WITH DIFFERENTIAL/PLATELET
Abs Immature Granulocytes: 0.04 K/uL (ref 0.00–0.07)
Basophils Absolute: 0 K/uL (ref 0.0–0.1)
Basophils Relative: 1 %
Eosinophils Absolute: 0.2 K/uL (ref 0.0–0.5)
Eosinophils Relative: 2 %
HCT: 26.4 % — ABNORMAL LOW (ref 39.0–52.0)
Hemoglobin: 8.3 g/dL — ABNORMAL LOW (ref 13.0–17.0)
Immature Granulocytes: 1 %
Lymphocytes Relative: 16 %
Lymphs Abs: 1.2 K/uL (ref 0.7–4.0)
MCH: 28.2 pg (ref 26.0–34.0)
MCHC: 31.4 g/dL (ref 30.0–36.0)
MCV: 89.8 fL (ref 80.0–100.0)
Monocytes Absolute: 0.6 K/uL (ref 0.1–1.0)
Monocytes Relative: 8 %
Neutro Abs: 5.1 K/uL (ref 1.7–7.7)
Neutrophils Relative %: 72 %
Platelets: 255 K/uL (ref 150–400)
RBC: 2.94 MIL/uL — ABNORMAL LOW (ref 4.22–5.81)
RDW: 16.7 % — ABNORMAL HIGH (ref 11.5–15.5)
WBC: 7.1 K/uL (ref 4.0–10.5)
nRBC: 0 % (ref 0.0–0.2)

## 2024-01-20 LAB — BRAIN NATRIURETIC PEPTIDE: B Natriuretic Peptide: 1736.2 pg/mL — ABNORMAL HIGH (ref 0.0–100.0)

## 2024-01-20 MED ORDER — NICARDIPINE HCL IN NACL 20-0.86 MG/200ML-% IV SOLN
0.0000 mg/h | INTRAVENOUS | Status: DC
  Administered 2024-01-20: 5 mg/h via INTRAVENOUS
  Administered 2024-01-21: 15 mg/h via INTRAVENOUS
  Administered 2024-01-21: 12.5 mg/h via INTRAVENOUS
  Administered 2024-01-21: 8 mg/h via INTRAVENOUS
  Administered 2024-01-21 (×4): 15 mg/h via INTRAVENOUS
  Administered 2024-01-21: 5.5 mg/h via INTRAVENOUS
  Administered 2024-01-21: 15 mg/h via INTRAVENOUS
  Administered 2024-01-21: 5 mg/h via INTRAVENOUS
  Administered 2024-01-21: 8 mg/h via INTRAVENOUS
  Administered 2024-01-22: 3.5 mg/h via INTRAVENOUS
  Administered 2024-01-22: 5 mg/h via INTRAVENOUS
  Administered 2024-01-22: 12.5 mg/h via INTRAVENOUS
  Administered 2024-01-22: 11 mg/h via INTRAVENOUS
  Filled 2024-01-20: qty 200
  Filled 2024-01-20: qty 400
  Filled 2024-01-20 (×14): qty 200

## 2024-01-20 MED ORDER — HYDRALAZINE HCL 50 MG PO TABS
100.0000 mg | ORAL_TABLET | Freq: Three times a day (TID) | ORAL | Status: DC
  Administered 2024-01-20 – 2024-02-10 (×58): 100 mg via ORAL
  Filled 2024-01-20 (×61): qty 2

## 2024-01-20 MED ORDER — CARVEDILOL 25 MG PO TABS
25.0000 mg | ORAL_TABLET | Freq: Two times a day (BID) | ORAL | Status: DC
  Filled 2024-01-20: qty 1

## 2024-01-20 MED ORDER — SEVELAMER CARBONATE 800 MG PO TABS
1600.0000 mg | ORAL_TABLET | Freq: Three times a day (TID) | ORAL | Status: DC
Start: 2024-01-21 — End: 2024-02-10
  Administered 2024-01-21 – 2024-02-09 (×49): 1600 mg via ORAL
  Filled 2024-01-20 (×53): qty 2

## 2024-01-20 MED ORDER — POLYETHYLENE GLYCOL 3350 17 G PO PACK: 17.0000 g | PACK | Freq: Every day | ORAL | Status: DC | PRN

## 2024-01-20 MED ORDER — DOCUSATE SODIUM 100 MG PO CAPS: 100.0000 mg | ORAL_CAPSULE | Freq: Two times a day (BID) | ORAL | Status: DC | PRN

## 2024-01-20 MED ORDER — HEPARIN SODIUM (PORCINE) 5000 UNIT/ML IJ SOLN
5000.0000 [IU] | Freq: Three times a day (TID) | INTRAMUSCULAR | Status: DC
  Administered 2024-01-21 – 2024-02-10 (×59): 5000 [IU] via SUBCUTANEOUS
  Filled 2024-01-20 (×61): qty 1

## 2024-01-20 MED ORDER — LABETALOL HCL 5 MG/ML IV SOLN
10.0000 mg | Freq: Once | INTRAVENOUS | Status: AC
  Administered 2024-01-20: 10 mg via INTRAVENOUS
  Filled 2024-01-20: qty 4

## 2024-01-20 MED ORDER — CHLORHEXIDINE GLUCONATE CLOTH 2 % EX PADS: 6.0000 | MEDICATED_PAD | Freq: Every day | CUTANEOUS | Status: DC

## 2024-01-20 MED ORDER — DIVALPROEX SODIUM ER 500 MG PO TB24
500.0000 mg | ORAL_TABLET | Freq: Every day | ORAL | Status: DC
  Administered 2024-01-21 – 2024-02-10 (×20): 500 mg via ORAL
  Filled 2024-01-20 (×21): qty 1

## 2024-01-20 MED ORDER — PANTOPRAZOLE SODIUM 40 MG PO TBEC
40.0000 mg | DELAYED_RELEASE_TABLET | Freq: Every day | ORAL | Status: DC
Start: 2024-01-21 — End: 2024-01-22
  Administered 2024-01-21: 40 mg via ORAL
  Filled 2024-01-20 (×2): qty 1

## 2024-01-20 MED ORDER — DOXAZOSIN MESYLATE 8 MG PO TABS
8.0000 mg | ORAL_TABLET | Freq: Every day | ORAL | Status: DC
  Filled 2024-01-20: qty 1

## 2024-01-20 MED ORDER — HYDROXYZINE HCL 10 MG PO TABS
5.0000 mg | ORAL_TABLET | Freq: Two times a day (BID) | ORAL | Status: DC | PRN
  Administered 2024-01-26 – 2024-01-31 (×2): 5 mg via ORAL
  Filled 2024-01-20 (×3): qty 1

## 2024-01-20 MED ORDER — LOSARTAN POTASSIUM 50 MG PO TABS
100.0000 mg | ORAL_TABLET | Freq: Every day | ORAL | Status: DC
Start: 2024-01-21 — End: 2024-01-25
  Administered 2024-01-21 – 2024-01-25 (×5): 100 mg via ORAL
  Filled 2024-01-20 (×6): qty 2

## 2024-01-20 NOTE — H&P (Signed)
 NAME:  [name], MRN:  [id number], DOB:  04/05/1986, LOS: 0 ADMISSION DATE:  01/20/2024 CONSULTATION DATE:  01/20/2024 REFERRING MD:  [place] - EDP, CHIEF COMPLAINT:  Malaise, hypertensive crisis   History of Present Illness:  38 year old man who presented to [facility] ED 9/10 for weakness/chills, body aches, general malaise. PMHx significant for HTN (difficult to control), HFpEF (Echo 12/2023 with EF 55-60%, severe concentric LVH, G2DD, small to moderate pericardial effusion), Alport syndrome, ESRD on HD (MWFS via RIJ TDC), GERD, anemia of chronic disease, bipolar 1 disorder, hearing loss, depression, tobacco use. Recent admission 8/16 - 8/18 for malaise/body aches/GI symptoms/dizziness, found to be COVID+ with hypertensive urgency.  Patient presented to [facility] ED 9/10 for weakness, chills, body aches, fatigue, facial numbness x 2 weeks. Last HD 9/8. Patient states that he recently had COVID a few weeks ago and was recovering from that when he began to feel poorly again. Was tested for COVID again at HD and reportedly negative. Has had rigor-like shaking chills during HD and feels like he cannot get warm (notes that his house thermostat is set at 84 degrees post-treatment). He reports fever/chills, no CP/SOB. Endorses diarrhea (even incontinent of stool in his sleep), no nausea/vomiting but has had hiccups. Also notes BLE swelling. No known sick contacts. Endorses intermittent HA, no significant vision changes.  En route with EMS, HR 86, BP 233/139, RR 86, SpO2 99% on RA. On ED arrival, patient was afebrile with HR 90s, BP 225/128, RR 21, SpO2 100%. Labs were notable for WBC 7.1, Hgb 8.3, Plt 255. Na 135, K 4.6, CO2 22, BUN/Cr 54/16.10. AST/ALT WNL, Alk Phos 139, Tbili 0.9. Trop 24. BNP 1736.2. CT Head NAICA. Labetalol  10mg  IV x 1 given without improvement in BP. Cardene  gtt was initiated.  PCCM consulted for ICU admission in the setting of Cardene .  Pertinent Medical History:   Past Medical History:   Diagnosis Date   Alport syndrome    Anemia    Bipolar 1 disorder (HCC)    CHF (congestive heart failure) (HCC)    Depression    ESRD (end stage renal disease) on dialysis (HCC)    GERD (gastroesophageal reflux disease)    Headache    Hearing difficulty of left ear    75% hearing   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   Heart murmur    Hypertension    Low blood sugar    Marijuana abuse    Noncompliance    Pericardial effusion 03/28/2023   Tobacco abuse    Significant Hospital Events: Including procedures, antibiotic start and stop dates in addition to other pertinent events   9/10 - Presented to [facility] ED with weakness/malaise, chills, body aches, fatigue; CT Head NAICA. Placed on Cardene  gtt for BP control. PCCM consulted.  Interim History / Subjective:  PCCM consulted for ICU admission in the setting of refractory HTN/Cardene  gtt initiation.  Objective:  Blood pressure (!) 234/116, pulse 92, temperature 98.8 F (37.1 C), resp. rate 14, SpO2 100%.       No intake or output data in the 24 hours ending 01/20/24 2329 There were no vitals filed for this visit.  Physical Examination: General: Acutely ill-appearing man in NAD. Pleasant and conversant. HEENT: [place]/AT, anicteric sclera, PERRL, moist mucous membranes. Neuro: Awake, oriented x 4. Responds to verbal stimuli. Following commands consistently. Moves all 4 extremities spontaneously. CV: RRR, no m/g/r. Prior well-healed incision s/p pericardiocentesis. RIJ TDC in R chest. PULM: Breathing even and  unlabored on RA. Lung fields CTAB. GI: Soft, nontender, nondistended. Normoactive bowel sounds. Extremities: Bilateral symmetric 2+ pedal edema noted with 1+BLE edema noted. Skin: Warm/dry, localized firm swelling of LUE at AVG site; +thrill more proximally but difficult to feel thrill over area of swelliing, mild erythema, mild TTP.  Resolved Hospital Problem List:    Assessment & Plan:  Hypertensive crisis History  of HTN, refractory/difficult to control Presented to [facility] ED with BP 225/128. Missed HD 9/10, last 9/8. CT Head fortunately NAICA. - Admit to ICU for further care - Cardene  gtt titrated to goal SBP - Goal MAP reduction 10-20% in the first hour, additional 5-15% over the next 23 hours. - Target BP <180/<120 mmHg for the first hour and <160/<110 mmHg for the next 23 hours  - Resume home meds  HFpEF BLE edema Echo 12/2023 with EF 55-60%, severe concentric LVH, G2DD, small to moderate pericardial effusion. - Cardiac monitoring - Optimize electrolytes for K > 4, Mg > 2 - Monitor I&Os - Volume removal with HD as tolerated  ESRD on HD (MWFS via RIJ TDC) Alport syndrome LUE AVG dysfunction Followed at [facility] for transplant evaluation. - Nephro consult for iHD continuation - Trend BMP - Replete electrolytes as indicated - Monitor I&Os - Avoid nephrotoxic agents as able - Ensure adequate renal perfusion - Due for intervention of AVG 9/11 Joelyn - VVS), will notify of patient's admission  Diarrhea, acute-on-chronic Increased frequency of diarrhea, per patient. Has been incontinent of stool in his sleep. - F/u viral GI panel - Gentle fluid resuscitation - Correct electrolyte derangements  Anemia of chronic disease - Trend H&H - Monitor for signs of active bleeding - Transfuse for Hgb < 7.0 or hemodynamically significant bleeding  GERD - PPI  Bipolar 1 disorder Depression - Continue home Depakote   Tobacco use - Encourage cessation - Nicotine  patch if needed  Labs:  CBC: Recent Labs  Lab 01/20/24 2100  WBC 7.1  NEUTROABS 5.1  HGB 8.3*  HCT 26.4*  MCV 89.8  PLT 255   Basic Metabolic Panel: Recent Labs  Lab 01/20/24 2100  NA 135  K 4.6  CL 96*  CO2 22  GLUCOSE 107*  BUN 54*  CREATININE 16.10*  CALCIUM  8.9   GFR: Estimated Creatinine Clearance: 6.1 mL/min (A) (by C-G formula based on SCr of 16.1 mg/dL (H)). Recent Labs  Lab 01/20/24 2100  WBC 7.1   Liver  Function Tests: Recent Labs  Lab 01/20/24 2100  AST 26  ALT 13  ALKPHOS 139*  BILITOT 0.9  PROT 6.7  ALBUMIN  2.8*   No results for input(s): LIPASE, AMYLASE in the last 168 hours. No results for input(s): AMMONIA in the last 168 hours.  ABG:    Component Value Date/Time   HCO3 23.0 11/17/2022 0755   TCO2 27 03/27/2023 1838   ACIDBASEDEF 1.0 11/17/2022 0755   O2SAT 92 11/17/2022 0755    Coagulation Profile: No results for input(s): INR, PROTIME in the last 168 hours.  Cardiac Enzymes: No results for input(s): CKTOTAL, CKMB, CKMBINDEX, TROPONINI in the last 168 hours.  HbA1C: Hemoglobin A1C  Date/Time Value Ref Range Status  05/30/2022 02:49 PM 4.6 4.0 - 5.6 % Final  02/11/2021 12:06 PM 5.1 4.0 - 5.6 % Final   CBG: No results for input(s): GLUCAP in the last 168 hours.  Review of Systems:   Review of systems completed with pertinent positives/negatives outlined in above HPI.  Past Medical History:  He,  has a past medical  history of Alport syndrome, Anemia, Bipolar 1 disorder (HCC), CHF (congestive heart failure) (HCC), Depression, ESRD (end stage renal disease) on dialysis (HCC), GERD (gastroesophageal reflux disease), Headache, Hearing difficulty of left ear, Hearing disorder of right ear, Heart murmur, Hypertension, Low blood sugar, Marijuana abuse, Noncompliance, Pericardial effusion (03/28/2023), and Tobacco abuse.   Surgical History:   Past Surgical History:  Procedure Laterality Date   A/V FISTULAGRAM Right 10/27/2022   Procedure: A/V Fistulagram;  Surgeon: Eliza Lonni RAMAN, MD;  Location: [facility] INVASIVE CV LAB;  Service: Cardiovascular;  Laterality: Right;   APPENDECTOMY     AV FISTULA PLACEMENT Left 03/03/2019   Procedure: ARTERIOVENOUS (AV) FISTULA CREATION LEFT ARM;  Surgeon: Sheree Penne Lonni, MD;  Location: [facility] OR;  Service: Vascular;  Laterality: Left;   BASCILIC VEIN TRANSPOSITION Right 04/12/2019   Procedure: RIGHT UPPER  EXTREMITY BASCILIC VEIN TRANSPOSITION FIRST STAGE FISTULA;  Surgeon: Eliza Lonni RAMAN, MD;  Location: [facility] OR;  Service: Vascular;  Laterality: Right;   BIOPSY  10/14/2021   Procedure: BIOPSY;  Surgeon: Legrand Victory LITTIE DOUGLAS, MD;  Location: [facility] ENDOSCOPY;  Service: Gastroenterology;;   BIOPSY  06/06/2022   Procedure: BIOPSY;  Surgeon: Shila Gustav GAILS, MD;  Location: [facility] ENDOSCOPY;  Service: Gastroenterology;;   BIOPSY  11/12/2022   Procedure: BIOPSY;  Surgeon: Wilhelmenia Aloha Raddle., MD;  Location: [facility] ENDOSCOPY;  Service: Gastroenterology;;   BIOPSY  12/05/2022   Procedure: BIOPSY;  Surgeon: Stacia Glendia BRAVO, MD;  Location: [facility] ENDOSCOPY;  Service: Gastroenterology;;   COLONOSCOPY WITH PROPOFOL  N/A 10/14/2021   Procedure: COLONOSCOPY WITH PROPOFOL ;  Surgeon: Legrand Victory LITTIE DOUGLAS, MD;  Location: [facility] ENDOSCOPY;  Service: Gastroenterology;  Laterality: N/A;   DIALYSIS/PERMA CATHETER INSERTION N/A 04/03/2023   Procedure: DIALYSIS/PERMA CATHETER INSERTION;  Surgeon: Magda Debby SAILOR, MD;  Location: [facility] INVASIVE CV LAB;  Service: Cardiovascular;  Laterality: N/A;   ESOPHAGOGASTRODUODENOSCOPY N/A 11/12/2022   Procedure: ESOPHAGOGASTRODUODENOSCOPY (EGD);  Surgeon: Wilhelmenia Aloha Raddle., MD;  Location: [facility] ENDOSCOPY;  Service: Gastroenterology;  Laterality: N/A;   ESOPHAGOGASTRODUODENOSCOPY N/A 12/05/2022   Procedure: ESOPHAGOGASTRODUODENOSCOPY (EGD);  Surgeon: Stacia Glendia BRAVO, MD;  Location: [facility] ENDOSCOPY;  Service: Gastroenterology;  Laterality: N/A;   ESOPHAGOGASTRODUODENOSCOPY (EGD) WITH PROPOFOL  N/A 06/06/2022   Procedure: ESOPHAGOGASTRODUODENOSCOPY (EGD) WITH PROPOFOL ;  Surgeon: Shila Gustav GAILS, MD;  Location: [facility] ENDOSCOPY;  Service: Gastroenterology;  Laterality: N/A;   FISTULA SUPERFICIALIZATION Right 11/07/2022   Procedure: PLICATION OF LARGE ANEURYSMS OF RIGHT ARTERIOVENOUS FISTULA WITH INSERTION OF 7mm INTERPOSITIONAL GORTEX GRAFT;  Surgeon: Eliza Lonni RAMAN, MD;  Location: [facility] OR;  Service:  Vascular;  Laterality: Right;   FLEXIBLE SIGMOIDOSCOPY N/A 04/14/2022   Procedure: FLEXIBLE SIGMOIDOSCOPY;  Surgeon: Shila Gustav GAILS, MD;  Location: [facility] ENDOSCOPY;  Service: Gastroenterology;  Laterality: N/A;   INSERTION OF DIALYSIS CATHETER N/A 11/07/2022   Procedure: INSERTION OF RIGHT INTERNAL JUGULAR TUNNELED DIALYSIS CATHETER;  Surgeon: Eliza Lonni RAMAN, MD;  Location: [facility] OR;  Service: Vascular;  Laterality: N/A;   LIGATION OF ARTERIOVENOUS  FISTULA Left 03/06/2019   Procedure: LIGATION OF ARTERIOVENOUS  FISTULA;  Surgeon: Gretta Lonni PARAS, MD;  Location: [facility] OR;  Service: Vascular;  Laterality: Left;   PERIPHERAL VASCULAR BALLOON ANGIOPLASTY  10/27/2022   Procedure: PERIPHERAL VASCULAR BALLOON ANGIOPLASTY;  Surgeon: Eliza Lonni RAMAN, MD;  Location: [facility] INVASIVE CV LAB;  Service: Cardiovascular;;  rt upper arm fistula   SAVORY DILATION N/A 11/12/2022   Procedure: SAVORY DILATION;  Surgeon: Wilhelmenia Aloha Raddle., MD;  Location: [facility] ENDOSCOPY;  Service: Gastroenterology;  Laterality: N/A;  spinal tap     SPINE SURGERY     related to a spinal infection, unsure of surgery or infection source   TRANSESOPHAGEAL ECHOCARDIOGRAM (CATH LAB) N/A 04/02/2023   Procedure: TRANSESOPHAGEAL ECHOCARDIOGRAM;  Surgeon: Jeffrie Oneil BROCKS, MD;  Location: [facility] INVASIVE CV LAB;  Service: Cardiovascular;  Laterality: N/A;   WISDOM TOOTH EXTRACTION     Social History:   reports that he has been smoking cigarettes. He has never used smokeless tobacco. He reports that he does not currently use alcohol. He reports current drug use. Drug: Marijuana.   Family History:  His family history includes Asthma in his son; Diabetes in his mother; Hearing loss in his father; Heart disease in his maternal grandmother; Hypertension in his mother and sister; Kidney failure in his mother; Stroke in his maternal grandfather.   Allergies: Allergies  Allergen Reactions   Nsaids Other (See Comments)    Contraindication  due to ckd    Zestril [Lisinopril] Swelling   Risperdal  [Risperidone ] Other (See Comments)    Unknown reaction    Home Medications: Prior to Admission medications   Medication Sig Start Date End Date Taking? Authorizing Provider  carvedilol  (COREG ) 25 MG tablet Take 1 tablet (25 mg total) by mouth 2 (two) times daily with a meal. 12/28/23   Jenna Maude BRAVO, NP  divalproex  (DEPAKOTE  ER) 500 MG 24 hr tablet Take 1 tablet (500 mg total) by mouth daily. 04/04/23   Joshua Domino, DO  doxazosin  (CARDURA ) 8 MG tablet Take 1 tablet by mouth daily. 12/04/23   [provider]  gabapentin  (NEURONTIN ) 100 MG capsule Please take 100 mg after dialysis on Tuesday, Thursday and Saturday 05/14/23   Cleotilde Lukes, DO  hydrALAZINE  (APRESOLINE ) 100 MG tablet Take 1 tablet (100 mg total) by mouth 3 (three) times daily. 04/04/23   Joshua Domino, DO  hydrOXYzine  (ATARAX ) 10 MG tablet Take 0.5 tablets (5 mg total) by mouth 2 (two) times daily as needed for itching. 07/21/23   Joshua Domino, DO  losartan  (COZAAR ) 100 MG tablet Take 1 tablet by mouth daily. 11/11/23   [provider]  NIFEdipine  (ADALAT  CC) 90 MG 24 hr tablet Take 90 mg by mouth in the morning and at bedtime.    [provider]  pantoprazole  (PROTONIX ) 40 MG tablet Take 1 tablet (40 mg total) by mouth daily. 12/28/23   Jenna Maude BRAVO, NP  sevelamer  carbonate (RENVELA ) 800 MG tablet Take 2 tablets (1,600 mg total) by mouth 3 (three) times daily with meals. 05/14/23   Cleotilde Lukes, DO   Critical care time:   The patient is critically ill with multiple organ system failure and requires high complexity decision making for assessment and support, frequent evaluation and titration of therapies, advanced monitoring, review of radiographic studies and interpretation of complex data.   Critical Care Time devoted to patient care services, exclusive of separately billable procedures, described in this note is 38 minutes.  Corean CHRISTELLA Chyla Schlender, PA-C [facility] 01/20/24 11:29 PM  Please see [email or web address] for pager details.  From 7A-7P if no response, please call [phone] After hours, please call ELink [phone]

## 2024-01-20 NOTE — ED Triage Notes (Signed)
 BIB [facility] from home for generalized weakness , body aches. Facial numbness for past 2 weeks. Chills N/V/D x2weeks. MWF dialysis. Last session Monday. Bilateral leg weakness. Tylenol  650mg  en route 233/139 107 CBG  86 HR 99 RA

## 2024-01-20 NOTE — ED Notes (Signed)
 Brought pt. To bathroom. Pt started ambulating on his own, but halfway through required use of wheelchair.

## 2024-01-20 NOTE — ED Provider Notes (Signed)
 37 year old male with a past medical history of [facility] Provider Note   CSN: [id number] Arrival date & time: 01/20/24  2021     Patient presents with: No chief complaint on file.   [name] is a 38 y.o. male.  {Add pertinent medical, surgical, social history, OB history to HPI:7578} 38 year old male with a past medical history of end-stage renal disease on hemodialysis due to Alport syndrome, uncontrolled hypertension, LVH, bipolar 1, hearing loss, and chronic anemia presenting to the emergency department for multiple complaints.  He reports intermittent palpitations, intermittent facial paresthesias, generalized weakness, and vomiting.  He reports these have been symptoms he has experienced for the last few weeks.  He did miss his dialysis session today.  He has known uncontrolled hypertension.  Last dialysis session was on Monday. Patient had an admission on 8/17 for hypertensive urgency and dialysis.  He required admission to the ICU for nicardipine  drip.        Prior to Admission medications   Medication Sig Start Date End Date Taking? Authorizing Provider  carvedilol  (COREG ) 25 MG tablet Take 1 tablet (25 mg total) by mouth 2 (two) times daily with a meal. 12/28/23   Jenna Maude BRAVO, NP  divalproex  (DEPAKOTE  ER) 500 MG 24 hr tablet Take 1 tablet (500 mg total) by mouth daily. 04/04/23   Joshua Domino, DO  doxazosin  (CARDURA ) 8 MG tablet Take 1 tablet by mouth daily. 12/04/23   [provider]  gabapentin  (NEURONTIN ) 100 MG capsule Please take 100 mg after dialysis on Tuesday, Thursday and Saturday 05/14/23   Cleotilde Lukes, DO  hydrALAZINE  (APRESOLINE ) 100 MG tablet Take 1 tablet (100 mg total) by mouth 3 (three) times daily. 04/04/23   Joshua Domino, DO  hydrOXYzine  (ATARAX ) 10 MG tablet Take 0.5 tablets (5 mg total) by mouth 2 (two) times daily as needed for itching. 07/21/23   Joshua Domino, DO  losartan  (COZAAR ) 100 MG  tablet Take 1 tablet by mouth daily. 11/11/23   [provider]  NIFEdipine  (ADALAT  CC) 90 MG 24 hr tablet Take 90 mg by mouth in the morning and at bedtime.    [provider]  pantoprazole  (PROTONIX ) 40 MG tablet Take 1 tablet (40 mg total) by mouth daily. 12/28/23   Jenna Maude BRAVO, NP  sevelamer  carbonate (RENVELA ) 800 MG tablet Take 2 tablets (1,600 mg total) by mouth 3 (three) times daily with meals. 05/14/23   Cleotilde Lukes, DO    Allergies: Nsaids, Zestril [lisinopril], and Risperdal  [risperidone ]    Review of Systems  All other systems reviewed and are negative.   Updated Vital Signs There were no vitals taken for this visit.  Physical Exam  (all labs ordered are listed, but only abnormal results are displayed) Labs Reviewed - No data to display  EKG: None  Radiology: No results found.  {Document cardiac monitor, telemetry assessment procedure when appropriate:32947} Procedures   Medications Ordered in the ED - No data to display    {Click here for ABCD2, HEART and other calculators REFRESH Note before signing:1}                              Medical Decision Making Patient continues to have uncontrolled hypertension despite labetalol .  We will start a nicardipine  drip since that has previously been the only medication effective on his blood pressure.  He is answering questions appropriately and has an  unremarkable neuroexam at this time.  No evidence of respiratory distress. Lab work consistent with end-stage renal disease, however his potassium is 4.6.  BNP significantly elevated as well.  CT head without contrast showed no acute abnormalities.    Amount and/or Complexity of Data Reviewed Labs: ordered. Radiology: ordered.  Risk Prescription drug management.      Final diagnoses:  None    ED Discharge Orders     None

## 2024-01-21 ENCOUNTER — Ambulatory Visit (HOSPITAL_COMMUNITY): Admission: RE | Admit: 2024-01-21 | Source: Home / Self Care | Admitting: Vascular Surgery

## 2024-01-21 ENCOUNTER — Encounter (HOSPITAL_COMMUNITY)
Admission: EM | Disposition: A | Discharge status: Home / Self Care | Payer: Self-pay | Source: Home / Self Care | Attending: Internal Medicine

## 2024-01-21 DIAGNOSIS — K219 Gastro-esophageal reflux disease without esophagitis: Secondary | ICD-10-CM

## 2024-01-21 DIAGNOSIS — F17210 Nicotine dependence, cigarettes, uncomplicated: Secondary | ICD-10-CM

## 2024-01-21 DIAGNOSIS — I169 Hypertensive crisis, unspecified: Secondary | ICD-10-CM | POA: Diagnosis not present

## 2024-01-21 DIAGNOSIS — N186 End stage renal disease: Secondary | ICD-10-CM

## 2024-01-21 DIAGNOSIS — Q8781 Alport syndrome: Secondary | ICD-10-CM

## 2024-01-21 DIAGNOSIS — Z992 Dependence on renal dialysis: Secondary | ICD-10-CM | POA: Diagnosis not present

## 2024-01-21 DIAGNOSIS — D631 Anemia in chronic kidney disease: Secondary | ICD-10-CM

## 2024-01-21 LAB — HEMOGLOBIN A1C
Hgb A1c MFr Bld: 4.3 % — ABNORMAL LOW (ref 4.8–5.6)
Mean Plasma Glucose: 76.71 mg/dL

## 2024-01-21 LAB — BASIC METABOLIC PANEL WITH GFR
Anion gap: 19 — ABNORMAL HIGH (ref 5–15)
BUN: 54 mg/dL — ABNORMAL HIGH (ref 6–20)
CO2: 21 mmol/L — ABNORMAL LOW (ref 22–32)
Calcium: 9.4 mg/dL (ref 8.9–10.3)
Chloride: 95 mmol/L — ABNORMAL LOW (ref 98–111)
Creatinine, Ser: 16.42 mg/dL — ABNORMAL HIGH (ref 0.61–1.24)
GFR, Estimated: 3 mL/min — ABNORMAL LOW (ref >60)
Glucose, Bld: 142 mg/dL — ABNORMAL HIGH (ref 70–99)
Potassium: 4.1 mmol/L (ref 3.5–5.1)
Sodium: 135 mmol/L (ref 135–145)

## 2024-01-21 LAB — CBC
HCT: 28 % — ABNORMAL LOW (ref 39.0–52.0)
Hemoglobin: 9 g/dL — ABNORMAL LOW (ref 13.0–17.0)
MCH: 28.4 pg (ref 26.0–34.0)
MCHC: 32.1 g/dL (ref 30.0–36.0)
MCV: 88.3 fL (ref 80.0–100.0)
Platelets: 280 K/uL (ref 150–400)
RBC: 3.17 MIL/uL — ABNORMAL LOW (ref 4.22–5.81)
RDW: 16.6 % — ABNORMAL HIGH (ref 11.5–15.5)
WBC: 8.3 K/uL (ref 4.0–10.5)
nRBC: 0 % (ref 0.0–0.2)

## 2024-01-21 LAB — MAGNESIUM: Magnesium: 2.3 mg/dL (ref 1.7–2.4)

## 2024-01-21 LAB — HEPATITIS B SURFACE ANTIGEN: Hepatitis B Surface Ag: NONREACTIVE

## 2024-01-21 LAB — MRSA NEXT GEN BY PCR, NASAL: MRSA by PCR Next Gen: NOT DETECTED

## 2024-01-21 LAB — PHOSPHORUS: Phosphorus: 4.5 mg/dL (ref 2.5–4.6)

## 2024-01-21 LAB — GLUCOSE, CAPILLARY: Glucose-Capillary: 103 mg/dL — ABNORMAL HIGH (ref 70–99)

## 2024-01-21 LAB — TROPONIN I (HIGH SENSITIVITY): Troponin I (High Sensitivity): 28 ng/L — ABNORMAL HIGH (ref <18)

## 2024-01-21 SURGERY — A/V SHUNT INTERVENTION
Anesthesia: LOCAL | Site: Arm Upper | Laterality: Left

## 2024-01-21 MED ORDER — ACETAMINOPHEN 500 MG PO TABS
1000.0000 mg | ORAL_TABLET | Freq: Four times a day (QID) | ORAL | Status: AC | PRN
  Administered 2024-01-21 (×3): 1000 mg via ORAL
  Filled 2024-01-21 (×3): qty 2

## 2024-01-21 MED ORDER — CHLORHEXIDINE GLUCONATE CLOTH 2 % EX PADS
6.0000 | MEDICATED_PAD | Freq: Every day | CUTANEOUS | Status: DC
  Administered 2024-01-21 – 2024-01-26 (×4): 6 via TOPICAL

## 2024-01-21 MED ORDER — LABETALOL HCL 200 MG PO TABS
200.0000 mg | ORAL_TABLET | Freq: Three times a day (TID) | ORAL | Status: DC
  Administered 2024-01-21 – 2024-01-24 (×8): 200 mg via ORAL
  Filled 2024-01-21 (×11): qty 1

## 2024-01-21 MED ORDER — FAMOTIDINE 20 MG PO TABS
20.0000 mg | ORAL_TABLET | Freq: Once | ORAL | Status: AC
Start: 2024-01-21 — End: 2024-01-21
  Administered 2024-01-21: 20 mg via ORAL
  Filled 2024-01-21: qty 1

## 2024-01-21 MED ORDER — LABETALOL HCL 5 MG/ML IV SOLN
10.0000 mg | INTRAVENOUS | Status: AC | PRN
  Administered 2024-01-21 – 2024-01-23 (×10): 10 mg via INTRAVENOUS
  Filled 2024-01-21 (×10): qty 4

## 2024-01-21 MED ORDER — FAMOTIDINE IN NACL 20-0.9 MG/50ML-% IV SOLN: 20.0000 mg | INTRAVENOUS | Status: DC

## 2024-01-21 MED ORDER — LIDOCAINE-PRILOCAINE 2.5-2.5 % EX CREA: 1.0000 | TOPICAL_CREAM | CUTANEOUS | Status: DC | PRN

## 2024-01-21 MED ORDER — LABETALOL HCL 100 MG PO TABS
100.0000 mg | ORAL_TABLET | Freq: Three times a day (TID) | ORAL | Status: DC
  Administered 2024-01-21 (×2): 100 mg via ORAL
  Filled 2024-01-21 (×3): qty 1

## 2024-01-21 MED ORDER — ALTEPLASE 2 MG IJ SOLR: 2.0000 mg | Freq: Once | INTRAMUSCULAR | Status: DC | PRN

## 2024-01-21 MED ORDER — PENTAFLUOROPROP-TETRAFLUOROETH EX AERO: 1.0000 | INHALATION_SPRAY | CUTANEOUS | Status: DC | PRN

## 2024-01-21 MED ORDER — HEPARIN SODIUM (PORCINE) 1000 UNIT/ML DIALYSIS
1000.0000 [IU] | INTRAMUSCULAR | Status: DC | PRN
  Administered 2024-01-21: 3200 [IU] via INTRAVENOUS_CENTRAL
  Administered 2024-01-22: 1000 [IU] via INTRAVENOUS_CENTRAL

## 2024-01-21 MED ORDER — NICOTINE 14 MG/24HR TD PT24
14.0000 mg | MEDICATED_PATCH | Freq: Every day | TRANSDERMAL | Status: DC
  Filled 2024-01-21 (×6): qty 1

## 2024-01-21 MED ORDER — DOXAZOSIN MESYLATE 4 MG PO TABS
4.0000 mg | ORAL_TABLET | Freq: Every day | ORAL | Status: DC
Start: 2024-01-21 — End: 2024-01-22
  Filled 2024-01-21 (×2): qty 1

## 2024-01-21 MED ORDER — METOCLOPRAMIDE HCL 5 MG/ML IJ SOLN
5.0000 mg | Freq: Two times a day (BID) | INTRAMUSCULAR | Status: AC | PRN
  Administered 2024-01-22 – 2024-02-03 (×2): 5 mg via INTRAVENOUS
  Filled 2024-01-21 (×2): qty 2

## 2024-01-21 MED ORDER — ACETAMINOPHEN 325 MG PO TABS
650.0000 mg | ORAL_TABLET | ORAL | Status: AC | PRN
  Administered 2024-01-21 – 2024-01-24 (×3): 650 mg via ORAL
  Filled 2024-01-21 (×3): qty 2

## 2024-01-21 MED ORDER — ORAL CARE MOUTH RINSE: 15.0000 mL | OROMUCOSAL | Status: DC | PRN

## 2024-01-21 MED ORDER — ONDANSETRON HCL 4 MG PO TABS
4.0000 mg | ORAL_TABLET | Freq: Three times a day (TID) | ORAL | Status: DC | PRN
  Administered 2024-01-21 – 2024-02-09 (×4): 4 mg via ORAL
  Filled 2024-01-21 (×6): qty 1

## 2024-01-21 MED ORDER — LIDOCAINE HCL (PF) 1 % IJ SOLN: 5.0000 mL | INTRAMUSCULAR | Status: DC | PRN

## 2024-01-21 NOTE — Progress Notes (Signed)
 NAME:  [name], MRN:  [id number], DOB:  February 09, 1986, LOS: 1 ADMISSION DATE:  01/20/2024 CONSULTATION DATE:  01/20/2024 REFERRING MD:  Cor[place]ia - EDP, CHIEF COMPLAINT:  Malaise, hypertensive crisis   History of Present Illness:  38 year old man who presented to [facility] ED 9/10 for weakness/chills, body aches, general malaise. PMHx significant for HTN (difficult to control), HFpEF (Echo 12/2023 with EF 55-60%, severe concentric LVH, G2DD, small to moderate pericardial effusion), Alport syndrome, ESRD on HD (MWFS via RIJ TDC), GERD, anemia of chronic disease, bipolar 1 disorder, hearing loss, depression, tobacco use. Recent admission 8/16 - 8/18 for malaise/body aches/GI symptoms/dizziness, found to be COVID+ with hypertensive urgency.  Patient presented to [facility] ED 9/10 for weakness, chills, body aches, fatigue, facial numbness x 2 weeks. Last HD 9/8. Patient states that he recently had COVID a few weeks ago and was recovering from that when he began to feel poorly again. Was tested for COVID again at HD and reportedly negative. Has had rigor-like shaking chills during HD and feels like he cannot get warm (notes that his house thermostat is set at 84 degrees post-treatment). He reports fever/chills, no CP/SOB. Endorses diarrhea (even incontinent of stool in his sleep), no nausea/vomiting but has had hiccups. Also notes BLE swelling. No known sick contacts. Endorses intermittent HA, no significant vision changes.  En route with EMS, HR 86, BP 233/139, RR 86, SpO2 99% on RA. On ED arrival, patient was afebrile with HR 90s, BP 225/128, RR 21, SpO2 100%. Labs were notable for WBC 7.1, Hgb 8.3, Plt 255. Na 135, K 4.6, CO2 22, BUN/Cr 54/16.10. AST/ALT WNL, Alk Phos 139, Tbili 0.9. Trop 24. BNP 1736.2. CT Head NAICA. Labetalol  10mg  IV x 1 given without improvement in BP. Cardene  gtt was initiated.  PCCM consulted for ICU admission in the setting of Cardene .  Pertinent Medical History:   Past Medical History:   Diagnosis Date   Alport syndrome    Anemia    Bipolar 1 disorder (HCC)    CHF (congestive heart failure) (HCC)    Depression    ESRD (end stage renal disease) on dialysis (HCC)    GERD (gastroesophageal reflux disease)    Headache    Hearing difficulty of left ear    75% hearing   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   Heart murmur    Hypertension    Low blood sugar    Marijuana abuse    Noncompliance    Pericardial effusion 03/28/2023   Tobacco abuse    Significant Hospital Events: Including procedures, antibiotic start and stop dates in addition to other pertinent events   9/10 - Presented to [facility] ED with weakness/malaise, chills, body aches, fatigue; CT Head NAICA. Placed on Cardene  gtt for BP control. PCCM consulted.  Interim History / Subjective:  No acute change overnight. Cardene  gtt titration down. Nurse giving morning oral anti-HTN  For HD today   Objective:  Blood pressure (!) 176/86, pulse 100, temperature (!) 97 F (36.1 C), temperature source Axillary, resp. rate 20, weight 74.8 kg, SpO2 93%.        Intake/Output Summary (Last 24 hours) at 01/21/2024 0920 Last data filed at 01/21/2024 0920 Gross per 24 hour  Intake 1269.48 ml  Output --  Net 1269.48 ml   Filed Weights   01/21/24 0045 01/21/24 0500  Weight: 76 kg 74.8 kg    Physical Examination: General:  young, chronically ill appearing male, NAD  HEENT: MM pink/moist  Neuro: sleepy but wakes, appropriate, MAE  CV: s1s2 rrr, no m/r/g, R internal jugular tunneled HD cath  PULM:  resps even non labored on 2LNC, clear  GI: soft, bsx4 active  Extremities: warm/dry, no edema, LUE AVG min thrill Skin: no rashes or lesions  General: Acutely ill-appearing man in NAD. Pleasant and conversant. HEENT: [place]/AT, anicteric sclera, PERRL, moist mucous membranes. Neuro: Awake, oriented x 4. Responds to verbal stimuli. Following commands consistently. Moves all 4 extremities spontaneously. CV: RRR, no  m/g/r. Prior well-healed incision s/p pericardiocentesis. RIJ TDC in R chest. PULM: Breathing even and unlabored on RA. Lung fields CTAB. GI: Soft, nontender, nondistended. Normoactive bowel sounds. Extremities: Bilateral symmetric 2+ pedal edema noted with 1+BLE edema noted. Skin: Warm/dry, localized firm swelling of LUE at AVG site; +thrill more proximally but difficult to feel thrill over area of swelliing, mild erythema, mild TTP.  Resolved Hospital Problem List:    Assessment & Plan:  Hypertensive crisis History of HTN, refractory/difficult to control c/b poor compliance  P: Continue cardene  gtt and titrate  Carvedilol , doxazosin , hydralazine , losartan  - pt refusing some meds, only agreeing to hydralazine  at this time  Holding further home meds - will assess BP response to these and volume removal with HD  Goal SBP 140-170   HFpEF BLE edema Echo 12/2023 with EF 55-60%, severe concentric LVH, G2DD, small to moderate pericardial effusion. - Cardiac monitoring - Optimize electrolytes for K > 4, Mg > 2 - Monitor I&Os - Volume removal with HD as tolerated  ESRD on HD (MWFS via RIJ TDC) Alport syndrome LUE AVG dysfunction Followed at [facility] for transplant evaluation. nephrology following  HD today  F/u chem  Trend BMP Monitor I&Os Avoid nephrotoxic agents as able Ensure adequate renal perfusion - Due for intervention of AVG 9/11 Joelyn - VVS), will notify of patient's admission  Diarrhea, acute-on-chronic Increased frequency of diarrhea, per patient. Has been incontinent of stool in his sleep. - F/u viral GI panel - Gentle fluid resuscitation - Correct electrolyte derangements  Anemia of chronic disease - Trend H&H - Monitor for signs of active bleeding - Transfuse for Hgb < 7.0 or hemodynamically significant bleeding  GERD - PPI  Bipolar 1 disorder Depression - Continue home Depakote   Tobacco use - Encourage cessation - Nicotine  patch   Labs:  CBC: Recent  Labs  Lab 01/20/24 2100 01/21/24 0110  WBC 7.1 8.3  NEUTROABS 5.1  --   HGB 8.3* 9.0*  HCT 26.4* 28.0*  MCV 89.8 88.3  PLT 255 280   Basic Metabolic Panel: Recent Labs  Lab 01/20/24 2100 01/21/24 0110  NA 135 135  K 4.6 4.1  CL 96* 95*  CO2 22 21*  GLUCOSE 107* 142*  BUN 54* 54*  CREATININE 16.10* 16.42*  CALCIUM  8.9 9.4  MG  --  2.3  PHOS  --  4.5   GFR: Estimated Creatinine Clearance: 6 mL/min (A) (by C-G formula based on SCr of 16.42 mg/dL (H)). Recent Labs  Lab 01/20/24 2100 01/21/24 0110  WBC 7.1 8.3   Liver Function Tests: Recent Labs  Lab 01/20/24 2100  AST 26  ALT 13  ALKPHOS 139*  BILITOT 0.9  PROT 6.7  ALBUMIN  2.8*   No results for input(s): LIPASE, AMYLASE in the last 168 hours. No results for input(s): AMMONIA in the last 168 hours.  ABG:    Component Value Date/Time   HCO3 23.0 11/17/2022 0755   TCO2 27 03/27/2023 1838   ACIDBASEDEF 1.0 11/17/2022 0755  O2SAT 92 11/17/2022 0755    Coagulation Profile: No results for input(s): INR, PROTIME in the last 168 hours.  Cardiac Enzymes: No results for input(s): CKTOTAL, CKMB, CKMBINDEX, TROPONINI in the last 168 hours.  HbA1C: Hemoglobin A1C  Date/Time Value Ref Range Status  05/30/2022 02:49 PM 4.6 4.0 - 5.6 % Final  02/11/2021 12:06 PM 5.1 4.0 - 5.6 % Final   CBG: Recent Labs  Lab 01/21/24 0020  GLUCAP 103*    Critical care time:     Rockie Myers, NP Pulmonary/Critical Care Medicine  01/21/2024  9:20 AM   See Tracey for personal pager PCCM on call pager [phone] until 7pm. Please call Elink 7p-7a. [phone]

## 2024-01-21 NOTE — Progress Notes (Signed)
 Called by nursing staff that pt was requesting tx to [facility] tx line, no Medical ICU beds available. Tx center said they could consider tx to [facility] if provider accepts. Discussed with pt, de does not want to go to [facility]. States he will just stay where I am at for now. He will remain on wait list for medical ICU bed at [facility] for now. No provider-provider consult or acceptance prior to bed availability per tx center coordinator. Discussed with pt.

## 2024-01-21 NOTE — Progress Notes (Signed)
 Pt receives out-pt HD at [facility] on MWF 12:20 pm chair time. Per clinic staff, pt was scheduled for a fistulagram today at 11:00 am. This info was provided to nephrologist and renal NP. Will assist as needed.   Randine Mungo Dialysis Navigator [phone]

## 2024-01-21 NOTE — Progress Notes (Signed)
 Went back to bedside again to explain necessary BP med changes from his home regimen that was inadequately controlling BP.  Leita SHAUNNA Gaskins, DO 01/21/24 12:00 PM [facility]

## 2024-01-21 NOTE — Procedures (Signed)
 Received patient in bed at bedside Alert and oriented.  Informed consent signed and in chart.  RIJ tunneled dual lumen catheter accessed per policy, without difficulty. Tx initiated per MD order. UF goal of 4000 ml as tolerated. 1645: pt c/o being cold, warm blankets given. 1700 Pt moaning, shivering, requesting to end tx. C. Anderson NP notified. 1705: tx terminated. Blood returned.  TX duration: 1 hour Dual lumen catheter flushed with NS, heparin  left to dwell. Caps and clamps in place.  Alert, without acute distress.  Hand-off given to patient's nurse.   Access used: RIJ tunneled dual lumen catheter. Access issues: none  Total UF removed: Medication(s) given: See MAR    [name]

## 2024-01-21 NOTE — Consult Note (Signed)
 [facility] Renal Consultation Note    Indication for Consultation:  Management of ESRD/hemodialysis; anemia, hypertension/volume and secondary hyperparathyroidism  ERE:Ajonry, Mahnoor, MD  HPI: [name] is a 38 y.o. male with ESRD on HD MWF at [facility]. He has a past medical history significant for severe uncontrolled HTN and hearing loss 2nd Alport syndrome. He presents to the ED for HTN emergency. Patient is in the ICU and we were consulted for ongoing dialysis needs.  Seen and examined patient at bedside on the ICU.  Noted he is on a nicardipine  drip.  SBP's ranging between 150s to 180s.  He is also on hydralazine , labetalol , and losartan .  Reviewed his outpatient HD records.  It appears he has been signing off treatment consistently.  His last HD was on 9/8.  He reports having fevers/chills after the last few treatments.  I called his outpatient hemodialysis center and the HD RN denied any of those issues happening outpatient.  He is afebrile here and current WBC is stable.  Discussed with Dr. Gretta and plan to order blood cultures x 2 today.  Noted he has a right TDC and a left AVG. The outpatient HD RN denied drainage from [facility] site and informed me patient has been c/o severe pain with cannulation and clots have been noted.  Plan for HD sometime today at bedside and will await blood cultures results. Will use TDC for now. He's on RA and denies SOB, CP, and N/V.  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   Heart murmur    Hypertension    Low blood sugar    Marijuana abuse    Noncompliance    Pericardial effusion 03/28/2023   Tobacco abuse     Past Surgical History:  Procedure Laterality Date   A/V FISTULAGRAM Right 10/27/2022   Procedure: A/V Fistulagram;  Surgeon: Eliza Lonni RAMAN, MD;  Location: [facility] INVASIVE CV LAB;  Service: Cardiovascular;  Laterality: Right;   APPENDECTOMY     AV FISTULA PLACEMENT Left 03/03/2019   Procedure: ARTERIOVENOUS (AV) FISTULA CREATION LEFT ARM;  Surgeon: Sheree Penne Lonni, MD;  Location: [facility] OR;  Service: Vascular;  Laterality: Left;   BASCILIC VEIN TRANSPOSITION Right 04/12/2019   Procedure: RIGHT UPPER EXTREMITY BASCILIC VEIN TRANSPOSITION FIRST STAGE FISTULA;  Surgeon: Eliza Lonni RAMAN, MD;  Location: [facility] OR;  Service: Vascular;  Laterality: Right;   BIOPSY  10/14/2021   Procedure: BIOPSY;  Surgeon: Legrand Victory LITTIE DOUGLAS, MD;  Location: [facility] ENDOSCOPY;  Service: Gastroenterology;;   BIOPSY  06/06/2022   Procedure: BIOPSY;  Surgeon: Shila Gustav GAILS, MD;  Location: [facility] ENDOSCOPY;  Service: Gastroenterology;;   BIOPSY  11/12/2022   Procedure: BIOPSY;  Surgeon: Wilhelmenia Aloha Raddle., MD;  Location: [facility] ENDOSCOPY;  Service: Gastroenterology;;   BIOPSY  12/05/2022   Procedure: BIOPSY;  Surgeon: Stacia Glendia BRAVO, MD;  Location: [facility] ENDOSCOPY;  Service: Gastroenterology;;   COLONOSCOPY WITH PROPOFOL  N/A 10/14/2021   Procedure: COLONOSCOPY WITH PROPOFOL ;  Surgeon: Legrand Victory LITTIE DOUGLAS, MD;  Location: [facility] ENDOSCOPY;  Service: Gastroenterology;  Laterality: N/A;  DIALYSIS/PERMA CATHETER INSERTION N/A 04/03/2023   Procedure: DIALYSIS/PERMA CATHETER INSERTION;  Surgeon: Magda Debby SAILOR, MD;  Location: [facility] INVASIVE CV LAB;  Service: Cardiovascular;  Laterality: N/A;   ESOPHAGOGASTRODUODENOSCOPY N/A 11/12/2022   Procedure: ESOPHAGOGASTRODUODENOSCOPY (EGD);  Surgeon: Wilhelmenia Aloha Raddle., MD;  Location: [facility] ENDOSCOPY;  Service: Gastroenterology;  Laterality: N/A;   ESOPHAGOGASTRODUODENOSCOPY N/A 12/05/2022   Procedure: ESOPHAGOGASTRODUODENOSCOPY (EGD);  Surgeon: Stacia Glendia BRAVO, MD;  Location: [facility]  ENDOSCOPY;  Service: Gastroenterology;  Laterality: N/A;   ESOPHAGOGASTRODUODENOSCOPY (EGD) WITH PROPOFOL  N/A 06/06/2022   Procedure: ESOPHAGOGASTRODUODENOSCOPY (EGD) WITH PROPOFOL ;  Surgeon: Shila Gustav GAILS, MD;  Location: [facility] ENDOSCOPY;  Service: Gastroenterology;  Laterality: N/A;   FISTULA SUPERFICIALIZATION Right 11/07/2022   Procedure: PLICATION OF LARGE ANEURYSMS OF RIGHT ARTERIOVENOUS FISTULA WITH INSERTION OF 7mm INTERPOSITIONAL GORTEX GRAFT;  Surgeon: Eliza Lonni RAMAN, MD;  Location: [facility] OR;  Service: Vascular;  Laterality: Right;   FLEXIBLE SIGMOIDOSCOPY N/A 04/14/2022   Procedure: FLEXIBLE SIGMOIDOSCOPY;  Surgeon: Shila Gustav GAILS, MD;  Location: [facility] ENDOSCOPY;  Service: Gastroenterology;  Laterality: N/A;   INSERTION OF DIALYSIS CATHETER N/A 11/07/2022   Procedure: INSERTION OF RIGHT INTERNAL JUGULAR TUNNELED DIALYSIS CATHETER;  Surgeon: Eliza Lonni RAMAN, MD;  Location: [facility] OR;  Service: Vascular;  Laterality: N/A;   LIGATION OF ARTERIOVENOUS  FISTULA Left 03/06/2019   Procedure: LIGATION OF ARTERIOVENOUS  FISTULA;  Surgeon: Gretta Lonni PARAS, MD;  Location: [facility] OR;  Service: Vascular;  Laterality: Left;   PERIPHERAL VASCULAR BALLOON ANGIOPLASTY  10/27/2022   Procedure: PERIPHERAL VASCULAR BALLOON ANGIOPLASTY;  Surgeon: Eliza Lonni RAMAN, MD;  Location: [facility] INVASIVE CV LAB;  Service: Cardiovascular;;  rt upper arm fistula   SAVORY DILATION N/A 11/12/2022   Procedure: SAVORY DILATION;  Surgeon: Wilhelmenia Aloha Raddle., MD;  Location: [facility] ENDOSCOPY;  Service: Gastroenterology;  Laterality: N/A;   spinal tap     SPINE SURGERY     related to a spinal infection, unsure of surgery or infection source   TRANSESOPHAGEAL ECHOCARDIOGRAM (CATH LAB) N/A 04/02/2023   Procedure: TRANSESOPHAGEAL ECHOCARDIOGRAM;  Surgeon: Jeffrie Oneil BROCKS, MD;  Location: [facility] INVASIVE CV LAB;  Service: Cardiovascular;  Laterality: N/A;   WISDOM TOOTH EXTRACTION     Family History  Problem Relation Age of  Onset   Hypertension Mother    Kidney failure Mother    Diabetes Mother    Hearing loss Father    Hypertension Sister    Heart disease Maternal Grandmother    Stroke Maternal Grandfather    Asthma Son    Social History:  reports that he has been smoking cigarettes. He has never used smokeless tobacco. He reports that he does not currently use alcohol. He reports current drug use. Drug: Marijuana. Allergies  Allergen Reactions   Nsaids Other (See Comments)    Contraindication due to ckd    Zestril [Lisinopril] Swelling   Risperdal  [Risperidone ] Other (See Comments)    Unknown reaction    Prior to Admission medications   Medication Sig Start Date End Date Taking? Authorizing Provider  divalproex  (DEPAKOTE  ER) 500 MG 24 hr tablet Take 1 tablet (500 mg total) by mouth daily. 04/04/23  Yes Joshua Domino, DO  hydrALAZINE  (APRESOLINE ) 100 MG tablet Take 1 tablet (100 mg total) by mouth 3 (three) times daily. 04/04/23  Yes Joshua Domino, DO  losartan  (COZAAR ) 100 MG tablet Take 100 mg by mouth daily. 11/11/23  Yes [provider]  NIFEdipine  (ADALAT  CC) 90 MG 24 hr tablet Take 90 mg by mouth in the morning and  at bedtime.   Yes [provider]  pantoprazole  (PROTONIX ) 40 MG tablet Take 1 tablet (40 mg total) by mouth daily. 12/28/23  Yes Jenna Maude BRAVO, NP  sevelamer  carbonate (RENVELA ) 800 MG tablet Take 2 tablets (1,600 mg total) by mouth 3 (three) times daily with meals. 05/14/23  Yes Cleotilde Lukes, DO  carvedilol  (COREG ) 25 MG tablet Take 1 tablet (25 mg total) by mouth 2 (two) times daily with a meal. Patient not taking: Reported on 01/21/2024 12/28/23   Jenna Maude BRAVO, NP  doxazosin  (CARDURA ) 8 MG tablet Take 1 tablet by mouth daily. Patient not taking: Reported on 01/21/2024 12/04/23   [provider]  gabapentin  (NEURONTIN ) 100 MG capsule Please take 100 mg after dialysis on Tuesday, Thursday and Saturday Patient not taking: Reported on 01/21/2024 05/14/23    Cleotilde Lukes, DO  hydrOXYzine  (ATARAX ) 10 MG tablet Take 0.5 tablets (5 mg total) by mouth 2 (two) times daily as needed for itching. Patient not taking: Reported on 01/21/2024 07/21/23   Joshua Domino, DO   Current Facility-Administered Medications  Medication Dose Route Frequency Provider Last Rate Last Admin   acetaminophen  (TYLENOL ) tablet 1,000 mg  1,000 mg Oral Q6H PRN Ogan, Okoronkwo U, MD   1,000 mg at 01/21/24 0640   Chlorhexidine  Gluconate Cloth 2 % PADS 6 each  6 each Topical Q0600 Lenon Charmaine BRAVO, NP   6 each at 01/21/24 0930   divalproex  (DEPAKOTE  ER) 24 hr tablet 500 mg  500 mg Oral Daily Ilah Krabbe M, PA-C   500 mg at 01/21/24 1058   docusate sodium  (COLACE) capsule 100 mg  100 mg Oral BID PRN Ilah Krabbe HERO, PA-C       doxazosin  (CARDURA ) tablet 4 mg  4 mg Oral Daily Whiteheart, Lamarr LABOR, NP       heparin  injection 5,000 Units  5,000 Units Subcutaneous Q8H Ilah Krabbe M, PA-C   5,000 Units at 01/21/24 0530   hydrALAZINE  (APRESOLINE ) tablet 100 mg  100 mg Oral TID Ilah Krabbe HERO, PA-C   100 mg at 01/21/24 1102   hydrOXYzine  (ATARAX ) tablet 5 mg  5 mg Oral BID PRN Ilah Krabbe HERO, PA-C       labetalol  (NORMODYNE ) tablet 100 mg  100 mg Oral TID Gretta Doffing P, DO       losartan  (COZAAR ) tablet 100 mg  100 mg Oral Daily Ilah Krabbe M, PA-C   100 mg at 01/21/24 1100   nicardipine  (CARDENE ) 20mg  in 0.86% saline IV infusion (0.1 mg/ml)  0-15 mg/hr Intravenous Continuous Valri Lamarr LABOR, NP 55 mL/hr at 01/21/24 1050 5.5 mg/hr at 01/21/24 1050   nicotine  (NICODERM CQ  - dosed in mg/24 hours) patch 14 mg  14 mg Transdermal Daily Valri Lamarr LABOR, NP       Oral care mouth rinse  15 mL Mouth Rinse PRN Layman Raisin, DO       pantoprazole  (PROTONIX ) EC tablet 40 mg  40 mg Oral Daily Ilah Krabbe M, PA-C   40 mg at 01/21/24 1101   polyethylene glycol (MIRALAX  / GLYCOLAX ) packet 17 g  17 g Oral Daily PRN Ilah Krabbe HERO, PA-C        sevelamer  carbonate (RENVELA ) tablet 1,600 mg  1,600 mg Oral TID WC Ilah Krabbe M, PA-C   1,600 mg at 01/21/24 0755   Labs: Basic Metabolic Panel: Recent Labs  Lab 01/20/24 2100 01/21/24 0110  NA 135 135  K 4.6 4.1  CL 96* 95*  CO2  22 21*  GLUCOSE 107* 142*  BUN 54* 54*  CREATININE 16.10* 16.42*  CALCIUM  8.9 9.4  PHOS  --  4.5   Liver Function Tests: Recent Labs  Lab 01/20/24 2100  AST 26  ALT 13  ALKPHOS 139*  BILITOT 0.9  PROT 6.7  ALBUMIN  2.8*   No results for input(s): LIPASE, AMYLASE in the last 168 hours. No results for input(s): AMMONIA in the last 168 hours. CBC: Recent Labs  Lab 01/20/24 2100 01/21/24 0110  WBC 7.1 8.3  NEUTROABS 5.1  --   HGB 8.3* 9.0*  HCT 26.4* 28.0*  MCV 89.8 88.3  PLT 255 280   Cardiac Enzymes: No results for input(s): CKTOTAL, CKMB, CKMBINDEX, TROPONINI in the last 168 hours. CBG: Recent Labs  Lab 01/21/24 0020  GLUCAP 103*   Iron  Studies: No results for input(s): IRON , TIBC, TRANSFERRIN, FERRITIN in the last 72 hours. Studies/Results: CT Head Wo Contrast Result Date: 01/20/2024 CLINICAL DATA:  Headache, dizziness, facial numbness, hypertension EXAM: CT HEAD WITHOUT CONTRAST TECHNIQUE: Contiguous axial images were obtained from the base of the skull through the vertex without intravenous contrast. RADIATION DOSE REDUCTION: This exam was performed according to the departmental dose-optimization program which includes automated exposure control, adjustment of the mA and/or kV according to patient size and/or use of iterative reconstruction technique. COMPARISON:  10/28/2023 FINDINGS: Brain: No acute infarct or hemorrhage. Lateral ventricles and midline structures are unremarkable. No acute extra-axial fluid collections. No mass effect. Vascular: No hyperdense vessel or unexpected calcification. Skull: Normal. Negative for fracture or focal lesion. Sinuses/Orbits: No acute finding. Other: None. IMPRESSION:  1. No acute intracranial process. Electronically Signed   By: Ozell Daring M.D.   On: 01/20/2024 21:36    ROS: All others negative except those listed in HPI.   Physical Exam: Vitals:   01/21/24 1045 01/21/24 1100 01/21/24 1115 01/21/24 1130  BP: (!) 158/74 (!) 167/78 (!) 155/72 (!) 157/79  Pulse: (!) 121 100 (!) 107 (!) 105  Resp: 20 18 (!) 23 (!) 23  Temp:  97.6 F (36.4 C)    TempSrc:  Oral    SpO2: 96% 97% 96% 96%  Weight:         General: Awake, alert, on RA, NAD, hard of hearing (he reads lips) Head: Sclera not icteric  Lungs: Mild rales posterior mid-lobes b/l. No wheeze or rhonchi. Breathing is unlabored. Heart: RRR. No murmur, rubs or gallops.  Abdomen: soft and non-tender M/S:  Equal strength b/l in upper and lower extremities.  Lower extremities:no edema, ischemic changes, or open wounds  Neuro: AAOx3. Moves all extremities spontaneously. Dialysis Access: R TDC; L AVG (+) B/T: no erythema or drainage on exam today  Dialysis Orders:  MWF - [facility] 4hrs, BFR 350, DFR Auto 1.5,  EDW 73.8kg, 2K/ 2Ca Heparin  No bolus ordered Mircera 200 mcg q2wks - last 9/8 Calcitriol  0.75mcgmcg PO qHD - last 9/8 Sensipar 30mg  with HD - last 9/8  Last Labs: Hgb 9, K 4.1, Ca 9.4, P 4.5, Alb 2.8  Assessment/Plan: HTN Emergency: On Nicardipine  drip, Hydralazine , Labetalol , and Losartan . Critical care following Possible infection: Patient c/o fevers/chills at his outpatient center after treatments lately. He's afebrile here and current WBC is stable. Ordered blood cultures X 2.  Vascular Access: Has a R TDC and L AVG. Informed by the outpatient HD RN of patient c/o pain with cannulation with clots. Will use TDC for now until possible infection has been r/o. Will try using the AVG  as next step. Consider VVS consult if issues with cannulation re-occurs.  ESRD -  on HD MWF. Last HD 9/8. He signs off consistently. Plan for HD sometime today at bedside Hypertension/volume  -  Not severely overloaded on exam. See above on HTN Anemia of CKD - Hgb 9, ESA not due yet. Last dose 9.8 in outpatient Secondary Hyperparathyroidism -  Ca/phos ok Nutrition - Watch K+ levels while on regular diet. Change to renal diet if K+ levels go up  Charmaine Piety, NP [facility] 01/21/2024, 11:56 AM

## 2024-01-21 NOTE — Progress Notes (Signed)
 eLink Physician-Brief Progress Note Patient Name: [name] DOB: November 07, 1985 MRN: [id number]   Date of Service  01/21/2024  HPI/Events of Note  Patient with ESRD admitted with hypertensive urgency. Patient complaining of headache.  eICU Interventions  New Patient Evaluation. PRN Tylenol  ordered for headache.        Lucrecia Mcphearson U Child Campoy 01/21/2024, 12:41 AM

## 2024-01-21 NOTE — Plan of Care (Signed)
  Problem: Education: Goal: Knowledge of General Education information will improve Description: Including pain rating scale, medication(s)/side effects and non-pharmacologic comfort measures Outcome: Progressing   Problem: Health Behavior/Discharge Planning: Goal: Ability to manage health-related needs will improve Outcome: Not Progressing   Problem: Clinical Measurements: Goal: Ability to maintain clinical measurements within normal limits will improve Outcome: Not Progressing Goal: Will remain free from infection Outcome: Progressing Goal: Diagnostic test results will improve Outcome: Progressing Goal: Respiratory complications will improve Outcome: Progressing Goal: Cardiovascular complication will be avoided Outcome: Not Progressing   Problem: Activity: Goal: Risk for activity intolerance will decrease Outcome: Progressing   Problem: Nutrition: Goal: Adequate nutrition will be maintained Outcome: Progressing   Problem: Coping: Goal: Level of anxiety will decrease Outcome: Not Progressing   Problem: Elimination: Goal: Will not experience complications related to bowel motility Outcome: Progressing Goal: Will not experience complications related to urinary retention Outcome: Progressing   Problem: Pain Managment: Goal: General experience of comfort will improve and/or be controlled Outcome: Progressing   Problem: Safety: Goal: Ability to remain free from injury will improve Outcome: Progressing   Problem: Skin Integrity: Goal: Risk for impaired skin integrity will decrease Outcome: Progressing

## 2024-01-21 NOTE — Progress Notes (Signed)
 eLink Physician-Brief Progress Note Patient Name: [name] DOB: 02-Nov-1985 MRN: [id number]   Date of Service  01/21/2024  HPI/Events of Note  Patient with sub-optimal blood pressure control.  eICU Interventions  Oral Labetalol  dose increased to 200 mg and iv PRN Labetalol  10 mg Q 2 hours added.        Alaria Oconnor U Lalena Salas 01/21/2024, 8:28 PM

## 2024-01-22 ENCOUNTER — Inpatient Hospital Stay (HOSPITAL_COMMUNITY)

## 2024-01-22 DIAGNOSIS — N186 End stage renal disease: Secondary | ICD-10-CM | POA: Diagnosis not present

## 2024-01-22 DIAGNOSIS — T82511D Breakdown (mechanical) of surgically created arteriovenous shunt, subsequent encounter: Secondary | ICD-10-CM

## 2024-01-22 DIAGNOSIS — Q8781 Alport syndrome: Secondary | ICD-10-CM | POA: Diagnosis not present

## 2024-01-22 DIAGNOSIS — Z992 Dependence on renal dialysis: Secondary | ICD-10-CM | POA: Diagnosis not present

## 2024-01-22 DIAGNOSIS — I169 Hypertensive crisis, unspecified: Secondary | ICD-10-CM | POA: Diagnosis not present

## 2024-01-22 LAB — CBC
HCT: 27.8 % — ABNORMAL LOW (ref 39.0–52.0)
Hemoglobin: 8.8 g/dL — ABNORMAL LOW (ref 13.0–17.0)
MCH: 28.5 pg (ref 26.0–34.0)
MCHC: 31.7 g/dL (ref 30.0–36.0)
MCV: 90 fL (ref 80.0–100.0)
Platelets: 195 K/uL (ref 150–400)
RBC: 3.09 MIL/uL — ABNORMAL LOW (ref 4.22–5.81)
RDW: 17 % — ABNORMAL HIGH (ref 11.5–15.5)
WBC: 10.3 K/uL (ref 4.0–10.5)
nRBC: 0 % (ref 0.0–0.2)

## 2024-01-22 LAB — HEPATITIS B SURFACE ANTIBODY, QUANTITATIVE: Hep B S AB Quant (Post): 209 m[IU]/mL

## 2024-01-22 LAB — MAGNESIUM: Magnesium: 2.1 mg/dL (ref 1.7–2.4)

## 2024-01-22 LAB — RENAL FUNCTION PANEL
Albumin: 2.9 g/dL — ABNORMAL LOW (ref 3.5–5.0)
Anion gap: 22 — ABNORMAL HIGH (ref 5–15)
BUN: 54 mg/dL — ABNORMAL HIGH (ref 6–20)
CO2: 15 mmol/L — ABNORMAL LOW (ref 22–32)
Calcium: 8.9 mg/dL (ref 8.9–10.3)
Chloride: 99 mmol/L (ref 98–111)
Creatinine, Ser: 15.46 mg/dL — ABNORMAL HIGH (ref 0.61–1.24)
GFR, Estimated: 4 mL/min — ABNORMAL LOW (ref >60)
Glucose, Bld: 104 mg/dL — ABNORMAL HIGH (ref 70–99)
Phosphorus: 5 mg/dL — ABNORMAL HIGH (ref 2.5–4.6)
Potassium: 4.8 mmol/L (ref 3.5–5.1)
Sodium: 137 mmol/L (ref 135–145)

## 2024-01-22 MED ORDER — NIFEDIPINE ER OSMOTIC RELEASE 60 MG PO TB24
90.0000 mg | ORAL_TABLET | Freq: Every day | ORAL | Status: DC
  Administered 2024-01-22: 90 mg via ORAL
  Filled 2024-01-22 (×2): qty 1

## 2024-01-22 MED ORDER — CALCIUM CARBONATE ANTACID 500 MG PO CHEW
1.0000 | CHEWABLE_TABLET | Freq: Three times a day (TID) | ORAL | Status: DC | PRN
  Administered 2024-01-22 – 2024-02-07 (×10): 200 mg via ORAL
  Filled 2024-01-22 (×10): qty 1

## 2024-01-22 MED ORDER — PANTOPRAZOLE SODIUM 40 MG PO TBEC
40.0000 mg | DELAYED_RELEASE_TABLET | Freq: Two times a day (BID) | ORAL | Status: DC
  Administered 2024-01-22 – 2024-02-09 (×36): 40 mg via ORAL
  Filled 2024-01-22 (×37): qty 1

## 2024-01-22 MED ORDER — HYDRALAZINE HCL 20 MG/ML IJ SOLN
20.0000 mg | INTRAMUSCULAR | Status: DC | PRN
  Administered 2024-01-23 – 2024-01-28 (×21): 20 mg via INTRAVENOUS
  Filled 2024-01-22 (×21): qty 1

## 2024-01-22 NOTE — Evaluation (Signed)
 Physical Therapy Evaluation Patient Details Name: [name] MRN: [id number] DOB: [birth year] Today's Date: 01/22/2024  History of Present Illness  38 yo male presenting 9/10 with weakness, chills, body aches and general malaise facial numbness x2 weeks. Recent admission 8/16 - 8/18 COVID (+) with hypertension urgency PMH HTN HFpEF Alport syndrome ESRD on HD ( [facility]) GERD Anemia bipolar 1 hearing loss depression tobacco use  Clinical Impression  Pt admitted with above diagnosis. Independent, living in apartment, no longer working due to dialysis but still drives. Pt Requires up to CGA with mobility, no assistive device utilized today. Mildly antalgic gait pattern with reduced stance time on Rt. States he has had Rt knee pain since having heart surgery at [facility] recently. Had an MRI the day prior to admission but states no results yet. Anticipate good functional progress. Would benefit from OPPT at d/c due to weakness. Will follow and progress acutely. Pt currently with functional limitations due to the deficits listed below (see PT Problem List). Pt will benefit from acute skilled PT to increase their independence and safety with mobility to allow discharge.           If plan is discharge home, recommend the following: Assist for transportation   Can travel by private vehicle        Equipment Recommendations None recommended by PT  Recommendations for Other Services       Functional Status Assessment Patient has had a recent decline in their functional status and demonstrates the ability to make significant improvements in function in a reasonable and predictable amount of time.     Precautions / Restrictions Precautions Precautions: Fall Recall of Precautions/Restrictions: Intact Restrictions Weight Bearing Restrictions Per Provider Order: No      Mobility  Bed Mobility Overal bed mobility: Needs Assistance Bed Mobility: Rolling, Supine to Sit Rolling: Contact guard assist    Supine to sit: Contact guard     General bed mobility comments: CGA for safety, no physical assist. cues for awareness of proximity to edge of bed.    Transfers Overall transfer level: Needs assistance Equipment used: None Transfers: Sit to/from Stand Sit to Stand: Contact guard assist           General transfer comment: CGA for safety, performed from edge of bed x2. No physical assit required. Minimal sway.    Ambulation/Gait Ambulation/Gait assistance: Contact guard assist, +2 safety/equipment Gait Distance (Feet): 155 Feet Assistive device: None Gait Pattern/deviations: Step-through pattern, Decreased stride length, Decreased stance time - right, Antalgic Gait velocity: dec Gait velocity interpretation: 1.31 - 2.62 ft/sec, indicative of limited community ambulator   General Gait Details: Mildly antalgic gait pattern with decreased stance time on Rt. No overt buckling. Pt reports issue since recent surgery at [facility] for his heart (had an MRI prior to admission but no results [facility] yet.) Educated on safety, awareness. Fatigues easily. VSS throughout. Stands at sink to perform ADLs, again without buckling. CGA for safety, +2 for equipment management.  Stairs            Wheelchair Mobility     Tilt Bed    Modified Rankin (Stroke Patients Only)       Balance Overall balance assessment: Mild deficits observed, not formally tested  Pertinent Vitals/Pain Pain Assessment Pain Assessment: No/denies pain    Home Living Family/patient expects to be discharged to:: Private residence Living Arrangements: Alone   Type of Home: Apartment Home Access: Level entry       Home Layout: One level Home Equipment: Cane - single point      Prior Function Prior Level of Function : Independent/Modified Independent;Driving             Mobility Comments: Denies fall, reports using cane SPC but has  been very weak lately. ADLs Comments: No longer working while on dialysis.     Extremity/Trunk Assessment   Upper Extremity Assessment Upper Extremity Assessment: Overall WFL for tasks assessed    Lower Extremity Assessment Lower Extremity Assessment: Defer to PT evaluation;Generalized weakness    Cervical / Trunk Assessment Cervical / Trunk Assessment: Normal  Communication   Communication Communication: Impaired Factors Affecting Communication: Hearing impaired    Cognition Arousal: Alert Behavior During Therapy: WFL for tasks assessed/performed   PT - Cognitive impairments: No apparent impairments                         Following commands: Intact       Cueing Cueing Techniques: Verbal cues, Other (comments) (written at times due to impaired hearing)     General Comments General comments (skin integrity, edema, etc.): VSS    Exercises     Assessment/Plan    PT Assessment Patient needs continued PT services  PT Problem List Decreased strength;Decreased activity tolerance;Decreased balance;Decreased mobility;Pain       PT Treatment Interventions DME instruction;Gait training;Functional mobility training;Therapeutic activities;Therapeutic exercise;Balance training;Neuromuscular re-education;Patient/family education;Modalities    PT Goals (Current goals can be found in the Care Plan section)  Acute Rehab PT Goals Patient Stated Goal: Get back into shape. Exercise again PT Goal Formulation: With patient Time For Goal Achievement: 02/05/24 Potential to Achieve Goals: Good    Frequency Min 2X/week     Co-evaluation PT/OT/SLP Co-Evaluation/Treatment: Yes Reason for Co-Treatment: Complexity of the patient's impairments (multi-system involvement);To address functional/ADL transfers;For patient/therapist safety PT goals addressed during session: Mobility/safety with mobility;Balance;Proper use of DME;Strengthening/ROM OT goals addressed during  session: ADL's and self-care       AM-PAC PT 6 Clicks Mobility  Outcome Measure Help needed turning from your back to your side while in a flat bed without using bedrails?: A Little Help needed moving from lying on your back to sitting on the side of a flat bed without using bedrails?: A Little Help needed moving to and from a bed to a chair (including a wheelchair)?: A Little Help needed standing up from a chair using your arms (e.g., wheelchair or bedside chair)?: A Little Help needed to walk in hospital room?: A Little Help needed climbing 3-5 steps with a railing? : A Little 6 Click Score: 18    End of Session   Activity Tolerance: Patient tolerated treatment well Patient left: in bed;with call bell/phone within reach (Dialysis team in room) Nurse Communication: Mobility status PT Visit Diagnosis: Unsteadiness on feet (R26.81);Other abnormalities of gait and mobility (R26.89);Muscle weakness (generalized) (M62.81);Difficulty in walking, not elsewhere classified (R26.2);Pain Pain - Right/Left: Right Pain - part of body: Knee    Time: 8569-8551 PT Time Calculation (min) (ACUTE ONLY): 18 min   Charges:   PT Evaluation $PT Eval Low Complexity: 1 Low   PT General Charges $$ ACUTE PT VISIT: 1 Visit         [name],  PT, DPT [facility]  Rehabilitation Services Physical Therapist Office: [phone] Website: [email or web address]   [name] 01/22/2024, 3:23 PM

## 2024-01-22 NOTE — TOC Initial Note (Signed)
 Transition of Care [facility]) - Initial/Assessment Note    Patient Details  Name: [name] MRN: [id number] Date of Birth: December 18, 1985  [facility]) CM/SW Contact:    Justina Delcia Czar, RN Phone Number: [phone] 01/22/2024, 4:50 PM  Clinical Narrative:                 Spoke to pt at bedside. States [place] is his HCPOA, gave permission to speak to cousin. States he lives at home independently. Uses his cane for ambulation as needed. He drives to his HD appts. His goal is to stay independent, declined [facility] for now. Offered choice for [facility], agreeable to [facility]. Spoke to cousin, [place] and she will review on [email or web address]. She will assist with transportation to home at dc.    Will need HHPT orders with [facility].   Will continue to follow for dc.  Expected Discharge Plan: Home w Home Health Services Barriers to Discharge: Continued Medical Work up   Patient Goals and CMS Choice Patient states their goals for this hospitalization and ongoing recovery are:: wants to be able to go home CMS Medicare.gov Compare Post Acute Care list provided to:: Patient Represenative (must comment) Choice offered to / list presented to : [facility] POA / Guardian      Expected Discharge Plan and Services   Discharge Planning Services: CM Consult Post Acute Care Choice: Home Health Living arrangements for the past 2 months: Apartment                                      Prior Living Arrangements/Services Living arrangements for the past 2 months: Apartment Lives with:: Self Patient language and need for interpreter reviewed:: Yes Do you feel safe going back to the place where you live?: Yes      Need for Family Participation in Patient Care: No (Comment)   Current home services: DME (cane) Criminal Activity/Legal Involvement Pertinent to Current Situation/Hospitalization: No - Comment as needed  Activities of Daily Living   ADL Screening (condition at time of  admission) Independently performs ADLs?: Yes (appropriate for developmental age) Is the patient deaf or have difficulty hearing?: Yes Does the patient have difficulty seeing, even when wearing glasses/contacts?: No Does the patient have difficulty concentrating, remembering, or making decisions?: No  Permission Sought/Granted Permission sought to share information with : Case Manager, Family Supports, PCP Permission granted to share information with : Yes, Verbal Permission Granted  Share Information with NAME: Jostin Rue  Permission granted to share info w AGENCY: Home health, PCP, DME  Permission granted to share info w Relationship: cousin  Permission granted to share info w Contact Information: [phone]  Emotional Assessment Appearance:: Appears stated age   Affect (typically observed): Accepting Orientation: : Oriented to Self, Oriented to Place, Oriented to  Time, Oriented to Situation   Psych Involvement: No (comment)  Admission diagnosis:  Hypertensive crisis [I16.9] Patient Active Problem List   Diagnosis Date Noted   COVID-19 virus detected 12/28/2023   Neurological abnormality 08/27/2023   Diarrhea 08/27/2023   Lipoma 07/22/2023   Acne vulgaris 07/22/2023   Anal itching 07/14/2023   Hypertensive crisis 05/08/2023   Moderate pericardial effusion 03/28/2023   GI bleed 12/04/2022   ESRD on dialysis (HCC) 12/04/2022   Cannabis use disorder, moderate, in controlled environment, dependence (HCC) 11/13/2022   Cannabis dependence with withdrawal (HCC) 11/13/2022   Anemia of chronic disease  11/12/2022   Esophageal dysphagia 11/11/2022   Abnormal barium swallow 11/11/2022   Chronic diastolic CHF (congestive heart failure) (HCC) 11/09/2022   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   Macrocytic anemia 11/08/2022   A-V fistula (HCC) 11/07/2022   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/07/2022   Gastritis and gastroduodenitis 06/06/2022    Migraines 06/05/2022   Rectal bleeding 04/14/2022   Heart failure with mildly reduced ejection fraction (HFmrEF) (HCC) 04/08/2022   Bilateral shoulder pain 04/08/2022   Acute otitis externa of both ears 04/08/2022   Housing insecurity 01/01/2022   Food insecurity 01/01/2022   Non-compliance with renal dialysis (HCC)    Hearing loss 10/08/2021   Acute lower GI bleeding 06/10/2021   Family history of diabetes mellitus 02/11/2021   Hypoglycemic syndrome 02/11/2021   Rash and nonspecific skin eruption 12/16/2019   Poorly-controlled hypertension 06/07/2018   Alport syndrome 07/14/2017   GERD (gastroesophageal reflux disease) 07/14/2017   History of appendectomy 07/14/2017   Obesity (BMI 30.0-34.9) 07/14/2017   Pre-transplant evaluation for kidney transplant 07/14/2017   Secondary hyperparathyroidism (HCC) 07/14/2017   Tobacco use 07/14/2017   Bipolar 1 disorder (HCC) 09/01/2013   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   PCP:  Lonnie Earnest, MD Pharmacy:   [id number] - [place], [place] - [address] AT [facility] [address] [place] [address] Phone: [phone] Fax: [phone]  [facility] [address] [place] [address] Phone: [phone] Fax: [phone]     Social Drivers of Health (SDOH) Social History: SDOH Screenings   Food Insecurity: No Food Insecurity (01/21/2024)  Housing: Low Risk  (01/21/2024)  Transportation Needs: No Transportation Needs (01/21/2024)  Utilities: Not At Risk (01/21/2024)  Depression (PHQ2-9): Medium Risk (06/02/2023)  Physical Activity: Inactive (06/02/2023)  Social Connections: Unknown (09/20/2021)   Received from [facility]  Stress: Stress Concern Present (06/02/2023)  Tobacco Use: High Risk (01/14/2024)   SDOH Interventions:     Readmission Risk Interventions    12/28/2023    1:41 PM  Readmission Risk Prevention Plan  Transportation Screening  Complete  PCP or Specialist Appt within 3-5 Days Complete  HRI or Home Care Consult Complete  Social Work Consult for Recovery Care Planning/Counseling Complete  Palliative Care Screening Not Applicable  Medication Review Oceanographer) Referral to Pharmacy

## 2024-01-22 NOTE — Progress Notes (Signed)
 [facility] Progress Note   Subjective:   Tolerated only 1 hr of HD yesterday due to what sounds like rigors, dizziness.  No flushing, fever reported.  Refused some of his oral antiHTN meds yesterday and remains on nicardipine  gtt.  This morning his hearing aid battery is out.    Objective Vitals:   01/22/24 0430 01/22/24 0500 01/22/24 0515 01/22/24 0646  BP: (!) 140/79 120/78 115/79 (!) 166/105  Pulse: 77 78 77 85  Resp: 18 19 18 18   Temp:      TempSrc:      SpO2: 91% (!) 89% 90% 96%  Weight:      Height:       Physical Exam General: agitated in bed discussing anti HTN meds Heart:RRR Lungs: clear Abdomen:soft Extremities:trace edema Dialysis Access: RIJ TDC c/d/I, L AVG +t/b with swelling noted - no erythema or tenderness, no fluctuance  Additional Objective Labs: Basic Metabolic Panel: Recent Labs  Lab 01/20/24 2100 01/21/24 0110  NA 135 135  K 4.6 4.1  CL 96* 95*  CO2 22 21*  GLUCOSE 107* 142*  BUN 54* 54*  CREATININE 16.10* 16.42*  CALCIUM  8.9 9.4  PHOS  --  4.5   Liver Function Tests: Recent Labs  Lab 01/20/24 2100  AST 26  ALT 13  ALKPHOS 139*  BILITOT 0.9  PROT 6.7  ALBUMIN  2.8*   No results for input(s): LIPASE, AMYLASE in the last 168 hours. CBC: Recent Labs  Lab 01/20/24 2100 01/21/24 0110  WBC 7.1 8.3  NEUTROABS 5.1  --   HGB 8.3* 9.0*  HCT 26.4* 28.0*  MCV 89.8 88.3  PLT 255 280   Blood Culture    Component Value Date/Time   SDES BLOOD RIGHT ARM 12/26/2023 2036   SDES BLOOD RIGHT HAND 12/26/2023 2036   SPECREQUEST  12/26/2023 2036    BOTTLES DRAWN AEROBIC AND ANAEROBIC Blood Culture adequate volume   SPECREQUEST  12/26/2023 2036    BOTTLES DRAWN AEROBIC ONLY Blood Culture results may not be optimal due to an inadequate volume of blood received in culture bottles   CULT  12/26/2023 2036    NO GROWTH 5 DAYS Performed at [facility], [address], [place], [place] [address]    CULT  12/26/2023 2036     NO GROWTH 5 DAYS Performed at [facility], [address], [place], [place] [address]    REPTSTATUS 12/31/2023 FINAL 12/26/2023 2036   REPTSTATUS 12/31/2023 FINAL 12/26/2023 2036    Cardiac Enzymes: No results for input(s): CKTOTAL, CKMB, CKMBINDEX, TROPONINI in the last 168 hours. CBG: Recent Labs  Lab 01/21/24 0020  GLUCAP 103*   Iron  Studies: No results for input(s): IRON , TIBC, TRANSFERRIN, FERRITIN in the last 72 hours. @lablastinr3 @ Studies/Results: CT Head Wo Contrast Result Date: 01/20/2024 CLINICAL DATA:  Headache, dizziness, facial numbness, hypertension EXAM: CT HEAD WITHOUT CONTRAST TECHNIQUE: Contiguous axial images were obtained from the base of the skull through the vertex without intravenous contrast. RADIATION DOSE REDUCTION: This exam was performed according to the departmental dose-optimization program which includes automated exposure control, adjustment of the mA and/or kV according to patient size and/or use of iterative reconstruction technique. COMPARISON:  10/28/2023 FINDINGS: Brain: No acute infarct or hemorrhage. Lateral ventricles and midline structures are unremarkable. No acute extra-axial fluid collections. No mass effect. Vascular: No hyperdense vessel or unexpected calcification. Skull: Normal. Negative for fracture or focal lesion. Sinuses/Orbits: No acute finding. Other: None. IMPRESSION: 1. No acute intracranial process. Electronically  Signed   By: Ozell Daring M.D.   On: 01/20/2024 21:36   Medications:  niCARDipine  1 mg/hr (01/22/24 9347)    Chlorhexidine  Gluconate Cloth  6 each Topical Q0600   divalproex   500 mg Oral Daily   doxazosin   4 mg Oral Daily   heparin   5,000 Units Subcutaneous Q8H   hydrALAZINE   100 mg Oral TID   labetalol   200 mg Oral TID   losartan   100 mg Oral Daily   nicotine   14 mg Transdermal Daily   pantoprazole   40 mg Oral Daily   sevelamer  carbonate  1,600 mg Oral TID WC  Dialysis Orders:  MWF -  [facility] 4hrs, BFR 350, DFR Auto 1.5,  EDW 73.8kg, 2K/ 2Ca Heparin  No bolus ordered Mircera 200 mcg q2wks - last 9/8 Calcitriol  0.4mcgmcg PO qHD - last 9/8 Sensipar 30mg  with HD - last 9/8   Last Labs: Hgb 9, K 4.1, Ca 9.4, P 4.5, Alb 2.8  Assessment/Plan: HTN Emergency: On Nicardipine  drip.  Critical care following and addressing his oral meds - he's been refusing some here.   Rigors with dialysis: Patient c/o fevers/chills at his outpatient center after treatments lately. He's afebrile here and current WBC is stable. Ordered blood cultures X 2.  Happened again yesterday - will try again today using nipro hypoallergenic dialyzer.  If he continues to have rigors with dialysis my index of suspicion for occult infection will be a lot higher and we can explore that further.   Vascular Access: Has a R TDC and L AVG. Informed by the outpatient HD RN of patient c/o pain with cannulation with clots. Will use TDC for now until possible infection has been r/o. Vascular US  ordered today to eval fluid collections. ESRD -  on HD MWF. Last HD 9/8. He signs off consistently for various reasons.  S/o after 1h yesterday due to rigors - as above try again today. Hypertension/volume  - Not severely overloaded on exam. See above on HTN Anemia of CKD - Hgb 9, ESA not due yet. Last dose 9.8 in outpatient Secondary Hyperparathyroidism -  Ca/phos ok Nutrition - Watch K+ levels while on regular diet. Change to renal diet if K+ levels go up  Manuelita Barters MD 01/22/2024, 7:49 AM  [facility] Pager: [phone]

## 2024-01-22 NOTE — Evaluation (Signed)
 Occupational Therapy Evaluation Patient Details Name: [name] MRN: [id number] DOB: 07/07/1985 Today's Date: 01/22/2024   History of Present Illness   38 yo male presenting 9/10 with weakness, chills, body aches and general malaise facial numbness x2 weeks. Recent admission 8/16 - 8/18 COVID (+) with hypertension urgency PMH HTN HFpEF Alport syndrome ESRD on HD ( MWFS) GERD Anemia bipolar 1 hearing loss depression tobacco use     Clinical Impressions PT admitted with weakness and body chills. Pt currently with functional limitiations due to the deficits listed below (see OT problem list). Pt reports weakness in BLE affecting ability to complete care at home. Pt also reports x4 reactions to HD that has never happen in 5 years and feeling scared by it.  Pt will benefit from skilled OT to increase their independence and safety with adls and balance to allow discharge outpatient.      If plan is discharge home, recommend the following:   A little help with walking and/or transfers;A little help with bathing/dressing/bathroom     Functional Status Assessment   Patient has had a recent decline in their functional status and demonstrates the ability to make significant improvements in function in a reasonable and predictable amount of time.     Equipment Recommendations   None recommended by OT     Recommendations for Other Services         Precautions/Restrictions   Precautions Precautions: Fall Recall of Precautions/Restrictions: Intact Restrictions Weight Bearing Restrictions Per Provider Order: No     Mobility Bed Mobility Overal bed mobility: Needs Assistance Bed Mobility: Rolling, Supine to Sit Rolling: Contact guard assist   Supine to sit: Contact guard          Transfers Overall transfer level: Needs assistance   Transfers: Sit to/from Stand Sit to Stand: Contact guard assist                  Balance                                            ADL either performed or assessed with clinical judgement   ADL Overall ADL's : Needs assistance/impaired Eating/Feeding: Independent   Grooming: Oral care;Modified independent;Standing           Upper Body Dressing : Supervision/safety;Sitting       Toilet Transfer: Contact guard assist           Functional mobility during ADLs: Contact guard assist       Vision   Vision Assessment?: No apparent visual deficits     Perception         Praxis         Pertinent Vitals/Pain Pain Assessment Pain Assessment: No/denies pain     Extremity/Trunk Assessment Upper Extremity Assessment Upper Extremity Assessment: Overall WFL for tasks assessed   Lower Extremity Assessment Lower Extremity Assessment: Defer to PT evaluation;Generalized weakness   Cervical / Trunk Assessment Cervical / Trunk Assessment: Normal   Communication Communication Communication: Impaired Factors Affecting Communication: Hearing impaired (gave battery for hearing aide)   Cognition Arousal: Alert Behavior During Therapy: WFL for tasks assessed/performed Cognition: No apparent impairments                               Following commands: Intact  Cueing  General Comments      VSS RA   Exercises     Shoulder Instructions      Home Living Family/patient expects to be discharged to:: Private residence Living Arrangements: Alone   Type of Home: Apartment Home Access: Level entry     Home Layout: One level     Bathroom Shower/Tub: Chief Strategy Officer: Standard     Home Equipment: Cane - single point          Prior Functioning/Environment Prior Level of Function : Independent/Modified Independent;Driving             Mobility Comments: Denies fall, reports using cane SPC but has been very weak lately. ADLs Comments: No longer working while on dialysis.    OT Problem List: Decreased strength;Impaired  balance (sitting and/or standing);Decreased safety awareness;Decreased knowledge of use of DME or AE;Decreased knowledge of precautions;Cardiopulmonary status limiting activity   OT Treatment/Interventions: Self-care/ADL training;Therapeutic exercise;DME and/or AE instruction;Therapeutic activities;Patient/family education;Balance training      OT Goals(Current goals can be found in the care plan section)   Acute Rehab OT Goals Patient Stated Goal: to get stronger OT Goal Formulation: With patient Time For Goal Achievement: 02/05/24 Potential to Achieve Goals: Good   OT Frequency:  Min 2X/week    Co-evaluation PT/OT/SLP Co-Evaluation/Treatment: Yes Reason for Co-Treatment: Complexity of the patient's impairments (multi-system involvement);To address functional/ADL transfers;For patient/therapist safety PT goals addressed during session: Mobility/safety with mobility;Balance;Proper use of DME;Strengthening/ROM OT goals addressed during session: ADL's and self-care      AM-PAC OT 6 Clicks Daily Activity     Outcome Measure Help from another person eating meals?: None Help from another person taking care of personal grooming?: None Help from another person toileting, which includes using toliet, bedpan, or urinal?: A Little Help from another person bathing (including washing, rinsing, drying)?: A Little Help from another person to put on and taking off regular upper body clothing?: None Help from another person to put on and taking off regular lower body clothing?: A Little 6 Click Score: 21   End of Session Nurse Communication: Mobility status;Precautions  Activity Tolerance: Patient tolerated treatment well Patient left: in bed;with call bell/phone within reach  OT Visit Diagnosis: Unsteadiness on feet (R26.81);Muscle weakness (generalized) (M62.81)                Time: 8568-8551 OT Time Calculation (min): 17 min Charges:  OT General Charges $OT Visit: 1 Visit OT  Evaluation $OT Eval Moderate Complexity: 1 Mod   Brynn, OTR/L  Acute Rehabilitation Services Office: [phone] .   [name] 01/22/2024, 3:09 PM

## 2024-01-22 NOTE — Progress Notes (Signed)
 NAME:  [name], MRN:  [id number], DOB:  October 13, 1985, LOS: 2 ADMISSION DATE:  01/20/2024 CONSULTATION DATE:  01/20/2024 REFERRING MD:  [place] - EDP, CHIEF COMPLAINT:  Malaise, hypertensive crisis   History of Present Illness:  38 year old man who presented to [facility] ED 9/10 for weakness/chills, body aches, general malaise. PMHx significant for HTN (difficult to control), HFpEF (Echo 12/2023 with EF 55-60%, severe concentric LVH, G2DD, small to moderate pericardial effusion), Alport syndrome, ESRD on HD (MWFS via RIJ TDC), GERD, anemia of chronic disease, bipolar 1 disorder, hearing loss, depression, tobacco use. Recent admission 8/16 - 8/18 for malaise/body aches/GI symptoms/dizziness, found to be COVID+ with hypertensive urgency.  Patient presented to [facility] ED 9/10 for weakness, chills, body aches, fatigue, facial numbness x 2 weeks. Last HD 9/8. Patient states that he recently had COVID a few weeks ago and was recovering from that when he began to feel poorly again. Was tested for COVID again at HD and reportedly negative. Has had rigor-like shaking chills during HD and feels like he cannot get warm (notes that his house thermostat is set at 84 degrees post-treatment). He reports fever/chills, no CP/SOB. Endorses diarrhea (even incontinent of stool in his sleep), no nausea/vomiting but has had hiccups. Also notes BLE swelling. No known sick contacts. Endorses intermittent HA, no significant vision changes.  En route with EMS, HR 86, BP 233/139, RR 86, SpO2 99% on RA. On ED arrival, patient was afebrile with HR 90s, BP 225/128, RR 21, SpO2 100%. Labs were notable for WBC 7.1, Hgb 8.3, Plt 255. Na 135, K 4.6, CO2 22, BUN/Cr 54/16.10. AST/ALT WNL, Alk Phos 139, Tbili 0.9. Trop 24. BNP 1736.2. CT Head NAICA. Labetalol  10mg  IV x 1 given without improvement in BP. Cardene  gtt was initiated.  PCCM consulted for ICU admission in the setting of Cardene .  Pertinent Medical History:   Past Medical History:   Diagnosis Date   Alport syndrome    Anemia    Bipolar 1 disorder (HCC)    CHF (congestive heart failure) (HCC)    Depression    ESRD (end stage renal disease) on dialysis (HCC)    GERD (gastroesophageal reflux disease)    Headache    Hearing difficulty of left ear    75% hearing   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   Heart murmur    Hypertension    Low blood sugar    Marijuana abuse    Noncompliance    Pericardial effusion 03/28/2023   Tobacco abuse    Significant Hospital Events: Including procedures, antibiotic start and stop dates in addition to other pertinent events   9/10 - Presented to [facility] ED with weakness/malaise, chills, body aches, fatigue; CT Head NAICA. Placed on Cardene  gtt for BP control. PCCM consulted. 9/12: almost off cardene  gtt. Plan to transition off and then out of unit. Will need further culture data if he repeat rigors. Nephrology planning to use hypoallergenic filter to see if this improves symptoms  Interim History / Subjective:  No changes overnight. Add nifedipine  to BP regimen and transition off cardene  gtt. Prns if needed. Once of drip can transfer out of ICU. Nephro following. Sent off aldosterone-renin panel for hyperaldosteronism work up   Objective:  Blood pressure (!) 170/102, pulse 88, temperature 97.9 F (36.6 C), temperature source Axillary, resp. rate (!) 24, height 5' 8 (1.727 m), weight 77.9 kg, SpO2 97%.        Intake/Output Summary (Last 24 hours)  at 01/22/2024 1208 Last data filed at 01/22/2024 0601 Gross per 24 hour  Intake 2234.35 ml  Output 700 ml  Net 1534.35 ml   Filed Weights   01/21/24 0500 01/21/24 1400 01/21/24 1549  Weight: 74.8 kg 74 kg 77.9 kg    Physical Examination: General:  young, chronically ill appearing male, NAD  HEENT: MM pink/moist Neuro: awake, alert, oriented x3, moves all extremities well  CV: s1s2 rrr, no m/r/g, R internal jugular tunneled HD cath  PULM:  resps even non labored on room  air  GI: soft, bsx4 active  Extremities: warm/dry, no edema, LUE AVG min thrill  Resolved Hospital Problem List:   Assessment & Plan:  Hypertensive crisis History of HTN, refractory/difficult to control c/b poor compliance  P: - send off aldosterone-renin panel for hyperaldosteronism work up with refractory hypertension - if positive need to weigh risk/benefit for spiro in this patient with poor compliance on dialysis and history of hyperkalemia - add home nifedipine  90 XL daily  - wean off cardene  gtt - goal SBP < 180 - con't hydralazine , labetalol , losartan  - patient refusing doxazosin  so will d/c and he is amendable to his home nifedipine  for now  - prn labetalol  and hydralazine  for SBP < 180   HFpEF BLE edema Echo 12/2023 with EF 55-60%, severe concentric LVH, G2DD, small to moderate pericardial effusion. - Cardiac monitoring - Optimize electrolytes for K > 4, Mg > 2 - Monitor I&Os - Volume removal with HD as tolerated  ESRD on HD (MWFS via RIJ TDC) Alport syndrome LUE AVG dysfunction Followed at [facility] for transplant evaluation. He tolerated 1hr of HD on 9/11 due to report of rigors and dizziness. No objective fever. Blood cultures were sent off.  - nephrology to trial hypoallergenic dialyzer. If he has repeated rigors with dialysis will need 3 sets of blood cultures for Dukes criteria and further work up and to initiate antibiotics for bacteremia until source found  - nephro following, appreciate help in management  - trend bmp, mag, phos - replete elytes - strict I&O - Avoid nephrotoxic agents, renally dose medications - ensure adequate renal perfusion  - Due for intervention of AVG 9/11 Joelyn - [facility]), will notify of patient's admission  Diarrhea, acute-on-chronic Increased frequency of diarrhea, per patient. Has been incontinent of stool in his sleep. - taking PO fluid  - normalize electrolytes   Anemia of chronic disease - Trend H&H - Monitor for signs of active  bleeding - Transfuse for Hgb < 7.0 or hemodynamically significant bleeding  GERD - PPI  Bipolar 1 disorder Depression - Continue home Depakote   Tobacco use - Encourage cessation - Nicotine  patch   Labs:  CBC: Recent Labs  Lab 01/20/24 2100 01/21/24 0110 01/22/24 1022  WBC 7.1 8.3 10.3  NEUTROABS 5.1  --   --   HGB 8.3* 9.0* 8.8*  HCT 26.4* 28.0* 27.8*  MCV 89.8 88.3 90.0  PLT 255 280 195   Basic Metabolic Panel: Recent Labs  Lab 01/20/24 2100 01/21/24 0110 01/22/24 0757  NA 135 135 137  K 4.6 4.1 4.8  CL 96* 95* 99  CO2 22 21* 15*  GLUCOSE 107* 142* 104*  BUN 54* 54* 54*  CREATININE 16.10* 16.42* 15.46*  CALCIUM  8.9 9.4 8.9  MG  --  2.3 2.1  PHOS  --  4.5 5.0*   GFR: Estimated Creatinine Clearance: 6.4 mL/min (A) (by C-G formula based on SCr of 15.46 mg/dL (H)). Recent Labs  Lab 01/20/24 2100  01/21/24 0110 01/22/24 1022  WBC 7.1 8.3 10.3   Liver Function Tests: Recent Labs  Lab 01/20/24 2100 01/22/24 0757  AST 26  --   ALT 13  --   ALKPHOS 139*  --   BILITOT 0.9  --   PROT 6.7  --   ALBUMIN  2.8* 2.9*   No results for input(s): LIPASE, AMYLASE in the last 168 hours. No results for input(s): AMMONIA in the last 168 hours.  ABG:    Component Value Date/Time   HCO3 23.0 11/17/2022 0755   TCO2 27 03/27/2023 1838   ACIDBASEDEF 1.0 11/17/2022 0755   O2SAT 92 11/17/2022 0755    Coagulation Profile: No results for input(s): INR, PROTIME in the last 168 hours.  Cardiac Enzymes: No results for input(s): CKTOTAL, CKMB, CKMBINDEX, TROPONINI in the last 168 hours.  HbA1C: Hemoglobin A1C  Date/Time Value Ref Range Status  05/30/2022 02:49 PM 4.6 4.0 - 5.6 % Final  02/11/2021 12:06 PM 5.1 4.0 - 5.6 % Final   Hgb A1c MFr Bld  Date/Time Value Ref Range Status  01/21/2024 01:10 AM 4.3 (L) 4.8 - 5.6 % Final    Comment:    (NOTE) Diagnosis of Diabetes The following HbA1c ranges recommended by the American Diabetes  Association (ADA) may be used as an aid in the diagnosis of diabetes mellitus.  Hemoglobin             Suggested A1C NGSP%              Diagnosis  <5.7                   Non Diabetic  5.7-6.4                Pre-Diabetic  >6.4                   Diabetic  <7.0                   Glycemic control for                       adults with diabetes.     CBG: Recent Labs  Lab 01/21/24 0020  GLUCAP 103*    Critical care time:     Tinnie FORBES Adolph DEVONNA [facility] 01/22/24 12:15 PM  Please see [email or web address] for pager details.  From 7A-7P if no response, please call [phone] After hours, please call ELink [phone]

## 2024-01-22 NOTE — Progress Notes (Addendum)
 The patient completed the dialysis treatment without issues. UF off 3 Liters.  01/22/24 1844  Vitals  Temp 98.4 F (36.9 C)  Temp Source Oral  BP 136/71  BP Location Left Arm  BP Method Automatic  Patient Position (if appropriate) Lying  Pulse Rate 90  Resp 20  Weight 75.1 kg  Type of Weight Post-Dialysis  Oxygen Therapy  SpO2 95 %  O2 Device Room Air  During Treatment Monitoring  Intra-Hemodialysis Comments Tx completed  Post Treatment  Dialyzer Clearance Lightly streaked  Hemodialysis Intake (mL) 0 mL  Liters Processed 80  Fluid Removed (mL) 3000 mL  Tolerated HD Treatment Yes  Post-Hemodialysis Comments goal met.  Hemodialysis Catheter Right Internal jugular Double lumen Permanent (Tunneled)  Placement Date/Time: 04/03/23 1228   Serial / Lot #: [id number]  Expiration Date: 10/10/27  Time Out: Correct patient;Correct site;Correct procedure  Maximum sterile barrier precautions: Hand hygiene;Cap;Mask;Sterile gown;Sterile gloves;Large sterile s...  Site Condition No complications  Blue Lumen Status Heparin  locked  Red Lumen Status Heparin  locked  Catheter fill solution Heparin  1000 units/ml  Catheter fill volume (Arterial) 1.6 cc  Catheter fill volume (Venous) 1.6  Dressing Type Transparent  Dressing Status Antimicrobial disc/dressing in place;Clean, Dry, Intact  Interventions Dressing changed  Drainage Description None  Dressing Change Due 01/28/24  Post treatment catheter status Capped and Clamped

## 2024-01-23 DIAGNOSIS — I169 Hypertensive crisis, unspecified: Secondary | ICD-10-CM | POA: Diagnosis not present

## 2024-01-23 LAB — CBC
HCT: 25.7 % — ABNORMAL LOW (ref 39.0–52.0)
Hemoglobin: 8 g/dL — ABNORMAL LOW (ref 13.0–17.0)
MCH: 28 pg (ref 26.0–34.0)
MCHC: 31.1 g/dL (ref 30.0–36.0)
MCV: 89.9 fL (ref 80.0–100.0)
Platelets: 179 K/uL (ref 150–400)
RBC: 2.86 MIL/uL — ABNORMAL LOW (ref 4.22–5.81)
RDW: 17 % — ABNORMAL HIGH (ref 11.5–15.5)
WBC: 5.6 K/uL (ref 4.0–10.5)
nRBC: 0 % (ref 0.0–0.2)

## 2024-01-23 LAB — BASIC METABOLIC PANEL WITH GFR
Anion gap: 13 (ref 5–15)
BUN: 36 mg/dL — ABNORMAL HIGH (ref 6–20)
CO2: 26 mmol/L (ref 22–32)
Calcium: 8.6 mg/dL — ABNORMAL LOW (ref 8.9–10.3)
Chloride: 98 mmol/L (ref 98–111)
Creatinine, Ser: 10.28 mg/dL — ABNORMAL HIGH (ref 0.61–1.24)
GFR, Estimated: 6 mL/min — ABNORMAL LOW (ref >60)
Glucose, Bld: 91 mg/dL (ref 70–99)
Potassium: 4.4 mmol/L (ref 3.5–5.1)
Sodium: 137 mmol/L (ref 135–145)

## 2024-01-23 MED ORDER — SPIRONOLACTONE 25 MG PO TABS
25.0000 mg | ORAL_TABLET | Freq: Every day | ORAL | Status: DC
  Administered 2024-01-23 – 2024-01-26 (×4): 25 mg via ORAL
  Filled 2024-01-23 (×4): qty 1

## 2024-01-23 MED ORDER — ISOSORBIDE MONONITRATE ER 30 MG PO TB24
30.0000 mg | ORAL_TABLET | Freq: Every day | ORAL | Status: DC
  Administered 2024-01-23: 30 mg via ORAL
  Filled 2024-01-23: qty 1

## 2024-01-23 MED ORDER — AMLODIPINE BESYLATE 10 MG PO TABS
10.0000 mg | ORAL_TABLET | Freq: Every day | ORAL | Status: DC
  Administered 2024-01-23: 10 mg via ORAL
  Filled 2024-01-23: qty 1

## 2024-01-23 MED ORDER — ISOSORBIDE MONONITRATE ER 30 MG PO TB24
30.0000 mg | ORAL_TABLET | Freq: Once | ORAL | Status: AC
  Administered 2024-01-23: 30 mg via ORAL
  Filled 2024-01-23: qty 1

## 2024-01-23 MED ORDER — HYDRALAZINE HCL 20 MG/ML IJ SOLN
40.0000 mg | Freq: Once | INTRAMUSCULAR | Status: AC
  Administered 2024-01-23: 40 mg via INTRAVENOUS
  Filled 2024-01-23: qty 2

## 2024-01-23 MED ORDER — ISOSORBIDE MONONITRATE ER 30 MG PO TB24: 60.0000 mg | ORAL_TABLET | Freq: Every day | ORAL | Status: DC

## 2024-01-23 NOTE — Plan of Care (Signed)

## 2024-01-23 NOTE — Progress Notes (Signed)
 eLink Physician-Brief Progress Note Patient Name: [name] DOB: Jun 22, 1985 MRN: [id number]   Date of Service  01/23/2024  HPI/Events of Note  Sub-optimal BP control despite 10 mg doses of Labetalol  and Hydralazine .  eICU Interventions  Hydralazine  40 mg iv x 1 ordered.        Lekesha Claw U Oree Mirelez 01/23/2024, 6:23 AM

## 2024-01-23 NOTE — Plan of Care (Signed)
  Problem: Education: Goal: Knowledge of General Education information will improve Description: Including pain rating scale, medication(s)/side effects and non-pharmacologic comfort measures Outcome: Progressing   Problem: Health Behavior/Discharge Planning: Goal: Ability to manage health-related needs will improve Outcome: Progressing   Problem: Clinical Measurements: Goal: Ability to maintain clinical measurements within normal limits will improve Outcome: Not Progressing Goal: Will remain free from infection Outcome: Progressing Goal: Diagnostic test results will improve Outcome: Progressing Goal: Respiratory complications will improve Outcome: Progressing Goal: Cardiovascular complication will be avoided Outcome: Not Progressing   Problem: Activity: Goal: Risk for activity intolerance will decrease Outcome: Progressing   Problem: Nutrition: Goal: Adequate nutrition will be maintained Outcome: Progressing

## 2024-01-23 NOTE — Progress Notes (Signed)
 Patient remains hypertensive despite PRN/stat meds Labetalol  and Apresoline .  Provider aware

## 2024-01-23 NOTE — Progress Notes (Signed)
 PROGRESS NOTE    [name]  FMW:[id number] DOB: [birth year] DOA: 01/20/2024 PCP: Lonnie Earnest, MD   Brief Narrative:  38 year old man with PMHx significant for HTN (difficult to control), HFpEF (Echo 12/2023 with EF 55-60%, severe concentric LVH, G2DD, small to moderate pericardial effusion), Alport syndrome, ESRD on HD (MWFS via RIJ TDC), GERD, anemia of chronic disease, bipolar 1 disorder, hearing loss, depression, tobacco use. Recent admission 8/16 - 8/18 for malaise/body aches/GI symptoms/dizziness, presented to [facility] ED 9/10 for weakness, chills, body aches, fatigue, facial numbness x 2 weeks. Last HD 9/8. Patient states that he recently had COVID a few weeks ago and was recovering from that when he began to feel poorly again. Was tested for COVID again at HD and reportedly negative. Has had rigor-like shaking chills during HD and feels like he cannot get warm (notes that his house thermostat is set at 84 degrees post-treatment). He reports fever/chills, no CP/SOB, found to be COVID+ with hypertensive urgency.   En route with EMS, HR 86, BP 233/139, RR 86, SpO2 99% on RA. On ED arrival, patient was afebrile with HR 90s, BP 225/128, RR 21, SpO2 100%. Labs were notable for WBC 7.1, Hgb 8.3, Plt 255. Na 135, K 4.6, CO2 22, BUN/Cr 54/16.10. AST/ALT WNL, Alk Phos 139, Tbili 0.9. Trop 24. BNP 1736.2. CT Head NAICA. Labetalol  10mg  IV x 1 given without improvement in BP. Cardene  gtt was initiated.  Admitted under PCCM, eventually Cardene  stopped and he was transferred under [facility] on 01/23/2024 for  Assessment & Plan:   Principal Problem:   Hypertensive crisis  Hypertensive crisis History of HTN, refractory/difficult to control c/b poor compliance/admitted with hypertensive crisis, POA: aldosterone-renin panel pending.  Initially needed Cardene  which has been discontinued.  Patient currently on home dose of nifedipine  90 mg XL, hydralazine  100 mg 3 times daily, labetalol  200 mg twice daily, losartan   100 mg daily and get blood pressure significantly uncontrolled with systolic in 200 range and diastolic in 100 range.  Will now add amlodipine  10 mg as well as Imdur  30 mg and discontinue nifedipine .   HFpEF / BLE edema Echo 12/2023 with EF 55-60%, severe concentric LVH, G2DD, small to moderate pericardial effusion. Optimize electrolytes for K > 4, Mg > 2.  Volume removal by HD   ESRD on HD (MWFS via RIJ TDC) Alport syndrome LUE AVG dysfunction Followed at [facility] for transplant evaluation. He tolerated 1hr of HD on 9/11 due to report of rigors and dizziness. No objective fever. Blood cultures were sent off.  - nephrology tried hypoallergenic dialyzer, no issues with that.  Cultures are negative and he is afebrile again.  Nephrology wants to try hypoallergenic dialyzer again on Monday.  Appreciate their help.   Diarrhea, acute-on-chronic Increased frequency of diarrhea, per patient. Has been incontinent of stool in his sleep. - taking PO fluid  - normalize electrolytes    Anemia of chronic disease - Trend H&H - Monitor for signs of active bleeding - Transfuse for Hgb < 7.0 or hemodynamically significant bleeding   GERD - PPI   Bipolar 1 disorder Depression - Continue home Depakote    Tobacco use - Encourage cessation - Nicotine  patch.  I have discussed tobacco cessation with the patient.  I have counseled the patient regarding the negative impacts of continued tobacco use including but not limited to lung cancer, COPD, and cardiovascular disease.  I have discussed alternatives to tobacco and modalities that may help facilitate tobacco cessation including but not limited to  biofeedback, hypnosis, and medications.  Total time spent with tobacco counseling was 5 minutes.   DVT prophylaxis: heparin  injection 5,000 Units Start: 01/21/24 0600 SCDs Start: 01/20/24 2230   Code Status: Full Code  Family Communication:  None present at bedside.  Plan of care discussed with patient in length  and he/she verbalized understanding and agreed with it.  Status is: Inpatient Remains inpatient appropriate because: Hypertensive crisis    Estimated body mass index is 25.17 kg/m as calculated from the following:   Height as of this encounter: 5' 8 (1.727 m).   Weight as of this encounter: 75.1 kg.    Nutritional Assessment: Body mass index is 25.17 kg/m.SABRA Seen by dietician.  I agree with the assessment and plan as outlined below: Nutrition Status:        . Skin Assessment: I have examined the patient's skin and I agree with the wound assessment as performed by the wound care RN as outlined below:    Consultants:  Nephrology  Procedures:  none  Antimicrobials:  Anti-infectives (From admission, onward)    None         Subjective: Patient seen and examined, nurse at the bedside.  When I saw him, he had no complaints but he said that he has been having intermittent chest pain associated with shortness of breath.  Some headache.  No other complaint.  He asked a lot of questions, I educated him about the changes that I am making to his blood pressure medications.  Objective: Vitals:   01/23/24 0745 01/23/24 0800 01/23/24 0815 01/23/24 0833  BP: (!) 225/124 (!) 218/108 (!) 224/112   Pulse: 94 89 90   Resp: (!) 25 (!) 23 19   Temp:    98.6 F (37 C)  TempSrc:    Axillary  SpO2: 100% 98% 98%   Weight:      Height:        Intake/Output Summary (Last 24 hours) at 01/23/2024 0900 Last data filed at 01/22/2024 1844 Gross per 24 hour  Intake 931.87 ml  Output 3000 ml  Net -2068.13 ml   Filed Weights   01/21/24 1549 01/22/24 1515 01/22/24 1844  Weight: 77.9 kg 78 kg 75.1 kg    Examination:  General exam: Appears calm and comfortable  Respiratory system: Clear to auscultation. Respiratory effort normal. Cardiovascular system: S1 & S2 heard, RRR. No JVD, murmurs, rubs, gallops or clicks. No pedal edema. Gastrointestinal system: Abdomen is nondistended, soft  and nontender. No organomegaly or masses felt. Normal bowel sounds heard. Central nervous system: Alert and oriented. No focal neurological deficits. Extremities: Symmetric 5 x 5 power. Skin: No rashes, lesions or ulcers Psychiatry: Judgement and insight appear normal. Mood & affect appropriate.    Data Reviewed: I have personally reviewed following labs and imaging studies  CBC: Recent Labs  Lab 01/20/24 2100 01/21/24 0110 01/22/24 1022 01/23/24 0230  WBC 7.1 8.3 10.3 5.6  NEUTROABS 5.1  --   --   --   HGB 8.3* 9.0* 8.8* 8.0*  HCT 26.4* 28.0* 27.8* 25.7*  MCV 89.8 88.3 90.0 89.9  PLT 255 280 195 179   Basic Metabolic Panel: Recent Labs  Lab 01/20/24 2100 01/21/24 0110 01/22/24 0757 01/23/24 0230  NA 135 135 137 137  K 4.6 4.1 4.8 4.4  CL 96* 95* 99 98  CO2 22 21* 15* 26  GLUCOSE 107* 142* 104* 91  BUN 54* 54* 54* 36*  CREATININE 16.10* 16.42* 15.46* 10.28*  CALCIUM  8.9 9.4  8.9 8.6*  MG  --  2.3 2.1  --   PHOS  --  4.5 5.0*  --    GFR: Estimated Creatinine Clearance: 9.6 mL/min (A) (by C-G formula based on SCr of 10.28 mg/dL (H)). Liver Function Tests: Recent Labs  Lab 01/20/24 2100 01/22/24 0757  AST 26  --   ALT 13  --   ALKPHOS 139*  --   BILITOT 0.9  --   PROT 6.7  --   ALBUMIN  2.8* 2.9*   No results for input(s): LIPASE, AMYLASE in the last 168 hours. No results for input(s): AMMONIA in the last 168 hours. Coagulation Profile: No results for input(s): INR, PROTIME in the last 168 hours. Cardiac Enzymes: No results for input(s): CKTOTAL, CKMB, CKMBINDEX, TROPONINI in the last 168 hours. BNP (last 3 results) No results for input(s): PROBNP in the last 8760 hours. HbA1C: Recent Labs    01/21/24 0110  HGBA1C 4.3*   CBG: Recent Labs  Lab 01/21/24 0020  GLUCAP 103*   Lipid Profile: No results for input(s): CHOL, HDL, LDLCALC, TRIG, CHOLHDL, LDLDIRECT in the last 72 hours. Thyroid Function Tests: No results  for input(s): TSH, T4TOTAL, FREET4, T3FREE, THYROIDAB in the last 72 hours. Anemia Panel: No results for input(s): VITAMINB12, FOLATE, FERRITIN, TIBC, IRON , RETICCTPCT in the last 72 hours. Sepsis Labs: No results for input(s): PROCALCITON, LATICACIDVEN in the last 168 hours.  Recent Results (from the past 240 hours)  MRSA Next Gen by PCR, Nasal     Status: None   Collection Time: 01/21/24 12:16 AM   Specimen: Nasal Mucosa; Nasal Swab  Result Value Ref Range Status   MRSA by PCR Next Gen NOT DETECTED NOT DETECTED Final    Comment: (NOTE) The GeneXpert MRSA Assay (FDA approved for NASAL specimens only), is one component of a comprehensive MRSA colonization surveillance program. It is not intended to diagnose MRSA infection nor to guide or monitor treatment for MRSA infections. Test performance is not FDA approved in patients less than 63 years old. Performed at [facility], [address], [place], [place] [address]   Culture, blood (Routine X 2) w Reflex to ID Panel     Status: None (Preliminary result)   Collection Time: 01/21/24 10:35 AM   Specimen: BLOOD  Result Value Ref Range Status   Specimen Description BLOOD LEFT ANTECUBITAL  Final   Special Requests   Final    BOTTLES DRAWN AEROBIC AND ANAEROBIC Blood Culture adequate volume   Culture   Final    NO GROWTH 2 DAYS Performed at [facility], [address] [place], [place] [address]    Report Status PENDING  Incomplete  Culture, blood (Routine X 2) w Reflex to ID Panel     Status: None (Preliminary result)   Collection Time: 01/21/24 10:35 AM   Specimen: BLOOD  Result Value Ref Range Status   Specimen Description BLOOD BLOOD RIGHT HAND  Final   Special Requests   Final    BOTTLES DRAWN AEROBIC AND ANAEROBIC Blood Culture adequate volume   Culture   Final    NO GROWTH 2 DAYS Performed at [facility], [address] [place], [place] [address]    Report Status PENDING   Incomplete     Radiology Studies: VAS US  DUPLEX DIALYSIS ACCESS (AVF, AVG) Result Date: 01/23/2024 DIALYSIS ACCESS Patient Name:  TAMEL ABEL [facility]  Date of Exam:   01/22/2024 Medical Rec #: [id number]  Accession #:    [id number] Date of Birth: 12-Jan-1986        Patient Gender: M Patient Age:   80 years Exam Location:  [facility] Procedure:      VAS US  DUPLEX DIALYSIS ACCESS (AVF, AVG) Referring Phys: MANUELITA BARTERS --------------------------------------------------------------------------------  Reason for Exam: Unable to dialyze through AVF/AVG. Access Site: Left Upper Extremity. Access Type: Upper arm straight graft. History: Mechanical complication AVG, eval fluid collection.          ESRD, hypertensive urgency. Comparison Study: No prior exam of the left upper extremity. Performing Technologist: Edilia Elden Appl  Examination Guidelines: A complete evaluation includes B-mode imaging, spectral Doppler, color Doppler, and power Doppler as needed of all accessible portions of each vessel. Unilateral testing is considered an integral part of a complete examination. Limited examinations for reoccurring indications may be performed as noted.  Findings: +--------------------+----------+-----------------+--------+ AVF                 PSV (cm/s)Flow Vol (mL/min)Comments +--------------------+----------+-----------------+--------+ Native artery inflow   157           75                 +--------------------+----------+-----------------+--------+   +--------------------+----------+-----------------+--------+ AVG                 PSV (cm/s)Flow Vol (mL/min)Describe +--------------------+----------+-----------------+--------+ Native artery inflow   157           245                +--------------------+----------+-----------------+--------+ Arterial anastomosis   402                              +--------------------+----------+-----------------+--------+ Prox graft             300                               +--------------------+----------+-----------------+--------+ Mid graft              307                              +--------------------+----------+-----------------+--------+ Distal graft           512                              +--------------------+----------+-----------------+--------+ Venous anastomosis     510                              +--------------------+----------+-----------------+--------+ Venous outflow          66                              +--------------------+----------+-----------------+--------+ Complex structure note in the medial middle to distal upper arm measuring 5.4 x 2.7 x 3.6 cm.  Summary: Patent arteriovenous graft.  Complex structure noted in left upper arm.  *See table(s) above for measurements and observations.  Diagnosing physician: Fonda Rim Electronically signed by Fonda Rim on 01/23/2024 at 8:27:21 AM.   --------------------------------------------------------------------------------   Final     Scheduled Meds:  amLODipine   10 mg Oral Daily   Chlorhexidine  Gluconate Cloth  6 each Topical Q0600  divalproex   500 mg Oral Daily   heparin   5,000 Units Subcutaneous Q8H   hydrALAZINE   100 mg Oral TID   isosorbide  mononitrate  30 mg Oral Daily   labetalol   200 mg Oral TID   losartan   100 mg Oral Daily   nicotine   14 mg Transdermal Daily   NIFEdipine   90 mg Oral Daily   pantoprazole   40 mg Oral BID   sevelamer  carbonate  1,600 mg Oral TID WC   spironolactone   25 mg Oral Daily   Continuous Infusions:   LOS: 3 days   Fredia Skeeter, MD [facility]  01/23/2024, 9:00 AM   *Please note that this is a verbal dictation therefore any spelling or grammatical errors are due to the Dragon Medical One system interpretation.  Please page via Amion and do not message via secure chat for urgent patient care matters. Secure chat can be used for non urgent patient care matters.  How to contact the [facility]  Attending or Consulting provider 7A - 7P or covering provider during after hours 7P -7A, for this patient?  Check the care team in [facility] and look for a) attending/consulting [facility] provider listed and b) the [facility] team listed. Page or secure chat 7A-7P. Log into [email or web address] and use [facility] universal password to access. If you do not have the password, please contact the hospital operator. Locate the [facility] provider you are looking for under [facility] and page to a number that you can be directly reached. If you still have difficulty reaching the provider, please page the [facility] (Director on Call) for the Hospitalists listed on amion for assistance.

## 2024-01-23 NOTE — Progress Notes (Signed)
 [facility] Progress Note   Subjective:   Tolerated full HD treatment yesterday with hypoallergenic dialyzer.  BP remains high off cardene  gtt.    Objective Vitals:   01/23/24 0545 01/23/24 0600 01/23/24 0632 01/23/24 0645  BP: (!) 193/106 (!) 200/102 (!) 216/113 (!) 202/99  Pulse: 88 88  89  Resp: (!) 7 (!) 9  16  Temp:      TempSrc:      SpO2: 98% 99%  98%  Weight:      Height:       Physical Exam General: calm this morning Heart:RRR Lungs: clear Abdomen:soft Extremities:trace edema Dialysis Access: RIJ TDC c/d/I, L AVG +t/b with swelling noted - no erythema or tenderness, no fluctuance  Additional Objective Labs: Basic Metabolic Panel: Recent Labs  Lab 01/21/24 0110 01/22/24 0757 01/23/24 0230  NA 135 137 137  K 4.1 4.8 4.4  CL 95* 99 98  CO2 21* 15* 26  GLUCOSE 142* 104* 91  BUN 54* 54* 36*  CREATININE 16.42* 15.46* 10.28*  CALCIUM  9.4 8.9 8.6*  PHOS 4.5 5.0*  --    Liver Function Tests: Recent Labs  Lab 01/20/24 2100 01/22/24 0757  AST 26  --   ALT 13  --   ALKPHOS 139*  --   BILITOT 0.9  --   PROT 6.7  --   ALBUMIN  2.8* 2.9*   No results for input(s): LIPASE, AMYLASE in the last 168 hours. CBC: Recent Labs  Lab 01/20/24 2100 01/21/24 0110 01/22/24 1022 01/23/24 0230  WBC 7.1 8.3 10.3 5.6  NEUTROABS 5.1  --   --   --   HGB 8.3* 9.0* 8.8* 8.0*  HCT 26.4* 28.0* 27.8* 25.7*  MCV 89.8 88.3 90.0 89.9  PLT 255 280 195 179   Blood Culture    Component Value Date/Time   SDES BLOOD LEFT ANTECUBITAL 01/21/2024 1035   SDES BLOOD BLOOD RIGHT HAND 01/21/2024 1035   SPECREQUEST  01/21/2024 1035    BOTTLES DRAWN AEROBIC AND ANAEROBIC Blood Culture adequate volume   SPECREQUEST  01/21/2024 1035    BOTTLES DRAWN AEROBIC AND ANAEROBIC Blood Culture adequate volume   CULT  01/21/2024 1035    NO GROWTH < 24 HOURS Performed at [facility], [address] [place], [place] [address]    CULT  01/21/2024 1035    NO GROWTH <  24 HOURS Performed at [facility], [address] [place], [place] [address]    REPTSTATUS PENDING 01/21/2024 1035   REPTSTATUS PENDING 01/21/2024 1035    Cardiac Enzymes: No results for input(s): CKTOTAL, CKMB, CKMBINDEX, TROPONINI in the last 168 hours. CBG: Recent Labs  Lab 01/21/24 0020  GLUCAP 103*   Iron  Studies: No results for input(s): IRON , TIBC, TRANSFERRIN, FERRITIN in the last 72 hours. @lablastinr3 @ Studies/Results: VAS US  DUPLEX DIALYSIS ACCESS (AVF, AVG) Result Date: 01/22/2024 DIALYSIS ACCESS Patient Name:  [place] SHELLHAMMER [facility]  Date of Exam:   01/22/2024 Medical Rec #: [id number]        Accession #:    [id number] Date of Birth: 1985/06/09        Patient Gender: M Patient Age:   38 years Exam Location:  [facility] Procedure:      VAS US  DUPLEX DIALYSIS ACCESS (AVF, AVG) Referring Phys: MANUELITA BARTERS --------------------------------------------------------------------------------  Reason for Exam: Unable to dialyze through AVF/AVG. Access Site: Left Upper Extremity. Access Type: Upper arm straight graft. History: Mechanical complication AVG, eval fluid collection.  ESRD, hypertensive urgency. Comparison Study: No prior exam of the left upper extremity. Performing Technologist: Edilia Elden Appl  Examination Guidelines: A complete evaluation includes B-mode imaging, spectral Doppler, color Doppler, and power Doppler as needed of all accessible portions of each vessel. Unilateral testing is considered an integral part of a complete examination. Limited examinations for reoccurring indications may be performed as noted.  Findings: +--------------------+----------+-----------------+--------+ AVF                 PSV (cm/s)Flow Vol (mL/min)Comments +--------------------+----------+-----------------+--------+ Native artery inflow   157           75                 +--------------------+----------+-----------------+--------+    +--------------------+----------+-----------------+--------+ AVG                 PSV (cm/s)Flow Vol (mL/min)Describe +--------------------+----------+-----------------+--------+ Native artery inflow   157           245                +--------------------+----------+-----------------+--------+ Arterial anastomosis   402                              +--------------------+----------+-----------------+--------+ Prox graft             300                              +--------------------+----------+-----------------+--------+ Mid graft              307                              +--------------------+----------+-----------------+--------+ Distal graft           512                              +--------------------+----------+-----------------+--------+ Venous anastomosis     510                              +--------------------+----------+-----------------+--------+ Venous outflow          66                              +--------------------+----------+-----------------+--------+ Complex structure note in the medial middle to distal upper arm measuring 5.4 x 2.7 x 3.6 cm.  Summary: Patent arteriovenous graft.  Complex structure noted in left upper arm.  *See table(s) above for measurements and observations.    --------------------------------------------------------------------------------   Preliminary    Medications:    Chlorhexidine  Gluconate Cloth  6 each Topical Q0600   divalproex   500 mg Oral Daily   heparin   5,000 Units Subcutaneous Q8H   hydrALAZINE   100 mg Oral TID   labetalol   200 mg Oral TID   losartan   100 mg Oral Daily   nicotine   14 mg Transdermal Daily   NIFEdipine   90 mg Oral Daily   pantoprazole   40 mg Oral BID   sevelamer  carbonate  1,600 mg Oral TID WC  Dialysis Orders:  MWF - [facility] 4hrs, BFR 350, DFR Auto 1.5,  EDW 73.8kg, 2K/ 2Ca Heparin  No bolus ordered Mircera 200 mcg q2wks - last 9/8 Calcitriol  0.75mcgmcg PO qHD -  last 9/8 Sensipar 30mg  with HD - last 9/8   Last Labs: Hgb 9, K 4.1, Ca 9.4, P 4.5, Alb 2.8  Assessment/Plan: HTN Emergency: On Nicardipine  drip.  Critical care following and addressing his oral meds - he's been refusing some here.   Rigors with dialysis: Patient c/o fevers/chills at his outpatient center after treatments lately. He's afebrile here and current WBC is stable. Ordered blood cultures X 2.  Happened again 9/11 - Used hyopallergenic Nipro dialyzer yesterday and had no issues.  We don't have any more of these in the hospital - I plan to re try the Revaclear with 2L saline flush Monday as he didn't have an anaphylactic reaction, rigors which were uncomfortable but not threatening.  Access: Has a R TDC and L AVG. Informed by the outpatient HD RN of patient c/o pain with cannulation with clots. Will use TDC for now until possible infection has been r/o. Vascular US  ordered today to eval fluid collections - pending but there is no erythema or fluctuance. ESRD -  on HD MWF. Last HD 9/12, next 9/15. Hypertension/volume  - Not severely overloaded on exam. See above on HTN Anemia of CKD - Hgb 9, ESA not due yet. Last dose 9.8 in outpatient Secondary Hyperparathyroidism -  Ca/phos ok Nutrition - Watch K+ levels while on regular diet. Change to renal diet if K+ levels go up  Manuelita Barters MD 01/23/2024, 7:13 AM  [facility] Pager: [phone]

## 2024-01-24 ENCOUNTER — Other Ambulatory Visit: Payer: Self-pay | Admitting: Internal Medicine

## 2024-01-24 DIAGNOSIS — I169 Hypertensive crisis, unspecified: Secondary | ICD-10-CM | POA: Diagnosis not present

## 2024-01-24 LAB — BASIC METABOLIC PANEL WITH GFR
Anion gap: 15 (ref 5–15)
BUN: 51 mg/dL — ABNORMAL HIGH (ref 6–20)
CO2: 21 mmol/L — ABNORMAL LOW (ref 22–32)
Calcium: 8.9 mg/dL (ref 8.9–10.3)
Chloride: 98 mmol/L (ref 98–111)
Creatinine, Ser: 12.39 mg/dL — ABNORMAL HIGH (ref 0.61–1.24)
GFR, Estimated: 5 mL/min — ABNORMAL LOW (ref >60)
Glucose, Bld: 78 mg/dL (ref 70–99)
Potassium: 5.1 mmol/L (ref 3.5–5.1)
Sodium: 134 mmol/L — ABNORMAL LOW (ref 135–145)

## 2024-01-24 LAB — CBC
HCT: 24.6 % — ABNORMAL LOW (ref 39.0–52.0)
Hemoglobin: 7.7 g/dL — ABNORMAL LOW (ref 13.0–17.0)
MCH: 28.1 pg (ref 26.0–34.0)
MCHC: 31.3 g/dL (ref 30.0–36.0)
MCV: 89.8 fL (ref 80.0–100.0)
Platelets: 206 K/uL (ref 150–400)
RBC: 2.74 MIL/uL — ABNORMAL LOW (ref 4.22–5.81)
RDW: 17 % — ABNORMAL HIGH (ref 11.5–15.5)
WBC: 5.6 K/uL (ref 4.0–10.5)
nRBC: 0 % (ref 0.0–0.2)

## 2024-01-24 LAB — GLUCOSE, CAPILLARY: Glucose-Capillary: 123 mg/dL — ABNORMAL HIGH (ref 70–99)

## 2024-01-24 MED ORDER — CLONIDINE HCL 0.1 MG PO TABS: 0.2000 mg | ORAL_TABLET | Freq: Two times a day (BID) | ORAL | Status: DC

## 2024-01-24 MED ORDER — CLONIDINE HCL 0.1 MG PO TABS
0.3000 mg | ORAL_TABLET | Freq: Three times a day (TID) | ORAL | Status: DC
  Administered 2024-01-24 – 2024-01-28 (×14): 0.3 mg via ORAL
  Filled 2024-01-24 (×14): qty 3

## 2024-01-24 MED ORDER — CLONIDINE HCL 0.1 MG PO TABS
0.2000 mg | ORAL_TABLET | ORAL | Status: AC
  Administered 2024-01-24: 0.2 mg via ORAL
  Filled 2024-01-24: qty 2

## 2024-01-24 MED ORDER — AMLODIPINE BESYLATE 10 MG PO TABS
10.0000 mg | ORAL_TABLET | Freq: Every day | ORAL | Status: DC
  Administered 2024-01-24 – 2024-02-04 (×11): 10 mg via ORAL
  Filled 2024-01-24 (×12): qty 1

## 2024-01-24 MED ORDER — AMLODIPINE BESYLATE 10 MG PO TABS: 10.0000 mg | ORAL_TABLET | Freq: Two times a day (BID) | ORAL | Status: DC

## 2024-01-24 MED ORDER — DARBEPOETIN ALFA 150 MCG/0.3ML IJ SOSY
150.0000 ug | PREFILLED_SYRINGE | INTRAMUSCULAR | Status: DC
Start: 2024-01-25 — End: 2024-02-10
  Administered 2024-01-25 – 2024-02-08 (×3): 150 ug via SUBCUTANEOUS
  Filled 2024-01-24 (×3): qty 0.3

## 2024-01-24 MED ORDER — ISOSORBIDE MONONITRATE ER 60 MG PO TB24
120.0000 mg | ORAL_TABLET | Freq: Every day | ORAL | Status: DC
  Administered 2024-01-24 – 2024-01-26 (×3): 120 mg via ORAL
  Filled 2024-01-24: qty 2
  Filled 2024-01-24 (×2): qty 4

## 2024-01-24 MED ORDER — LOPERAMIDE HCL 2 MG PO CAPS
2.0000 mg | ORAL_CAPSULE | ORAL | Status: DC | PRN
  Administered 2024-01-24: 2 mg via ORAL
  Filled 2024-01-24: qty 1

## 2024-01-24 NOTE — Plan of Care (Signed)
 Hypertensive crisis History of refractory hypertension-difficult to control high blood pressure ESRD on dialysis -Patient initially required Cardene  drip for control blood pressure eventually he has been transition to home medications-currently on 5 blood pressure regimen-amlodipine  10 mg, hydralazine  100 mg 3 times daily, Imdur  60 mg, losartan  100 mg, labetalol  200 mg 3 times daily and spironolactone  25 mg daily. On top of that patient is receiving IV hydralazine  20 mg every 4 hours as needed.   9/11 patient required total 10 doses of IV labetalol . - Systolic blood pressure remained upper 180-190s range and diastolic blood pressure remained in between 90-115 range.  Heart rate in between 80 to 90s range. [place] Sermon with ICU physician Dr.Albustami recommended to start either clonidine  patch or oral clonidine  0.2 mg. - Starting oral clonidine  0.2 mg twice daily. -Pending aldosterone and renin level. -Patient has been scheduled for dialysis on Monday.   Latif Nazareno, MD [facility] 01/24/2024, 3:30 AM

## 2024-01-24 NOTE — Progress Notes (Signed)
 [facility] Progress Note   Subjective:   BP remains high off cardene  gtt.  Oral meds have been greatly escalated in the past < 24h.  This morning he's smiling eating a vigorous breakfast only c/w ongoing diarrhea which RN says has just a few flecks of blood, is watery brown  Objective Vitals:   01/24/24 0400 01/24/24 0430 01/24/24 0500 01/24/24 0530  BP: (!) 197/110 (!) 206/106 (!) 189/102 (!) 196/108  Pulse:      Resp:      Temp:      TempSrc:      SpO2:      Weight:   81 kg   Height:       Physical Exam General: calm this morning Heart:RRR Lungs: clear Abdomen:soft Extremities:trace edema Dialysis Access: RIJ TDC c/d/I, L AVG +t/b with swelling noted - no erythema or tenderness, no fluctuance  Additional Objective Labs: Basic Metabolic Panel: Recent Labs  Lab 01/21/24 0110 01/22/24 0757 01/23/24 0230 01/24/24 0332  NA 135 137 137 134*  K 4.1 4.8 4.4 5.1  CL 95* 99 98 98  CO2 21* 15* 26 21*  GLUCOSE 142* 104* 91 78  BUN 54* 54* 36* 51*  CREATININE 16.42* 15.46* 10.28* 12.39*  CALCIUM  9.4 8.9 8.6* 8.9  PHOS 4.5 5.0*  --   --    Liver Function Tests: Recent Labs  Lab 01/20/24 2100 01/22/24 0757  AST 26  --   ALT 13  --   ALKPHOS 139*  --   BILITOT 0.9  --   PROT 6.7  --   ALBUMIN  2.8* 2.9*   No results for input(s): LIPASE, AMYLASE in the last 168 hours. CBC: Recent Labs  Lab 01/20/24 2100 01/21/24 0110 01/22/24 1022 01/23/24 0230 01/24/24 0332  WBC 7.1 8.3 10.3 5.6 5.6  NEUTROABS 5.1  --   --   --   --   HGB 8.3* 9.0* 8.8* 8.0* 7.7*  HCT 26.4* 28.0* 27.8* 25.7* 24.6*  MCV 89.8 88.3 90.0 89.9 89.8  PLT 255 280 195 179 206   Blood Culture    Component Value Date/Time   SDES BLOOD LEFT ANTECUBITAL 01/21/2024 1035   SDES BLOOD BLOOD RIGHT HAND 01/21/2024 1035   SPECREQUEST  01/21/2024 1035    BOTTLES DRAWN AEROBIC AND ANAEROBIC Blood Culture adequate volume   SPECREQUEST  01/21/2024 1035    BOTTLES DRAWN AEROBIC AND  ANAEROBIC Blood Culture adequate volume   CULT  01/21/2024 1035    NO GROWTH 2 DAYS Performed at [facility], [address], [place], [place] [address]    CULT  01/21/2024 1035    NO GROWTH 2 DAYS Performed at [facility], [address] [place], [place] [address]    REPTSTATUS PENDING 01/21/2024 1035   REPTSTATUS PENDING 01/21/2024 1035    Cardiac Enzymes: No results for input(s): CKTOTAL, CKMB, CKMBINDEX, TROPONINI in the last 168 hours. CBG: Recent Labs  Lab 01/21/24 0020  GLUCAP 103*   Iron  Studies: No results for input(s): IRON , TIBC, TRANSFERRIN, FERRITIN in the last 72 hours. @lablastinr3 @ Studies/Results: VAS US  DUPLEX DIALYSIS ACCESS (AVF, AVG) Result Date: 01/23/2024 DIALYSIS ACCESS Patient Name:  [name] [facility]  Date of Exam:   01/22/2024 Medical Rec #: [id number]        Accession #:    [id number] Date of Birth: 1985/10/26        Patient Gender: M Patient Age:   38 years Exam Location:  [facility] Procedure:  VAS US  DUPLEX DIALYSIS ACCESS (AVF, AVG) Referring Phys: MANUELITA BARTERS --------------------------------------------------------------------------------  Reason for Exam: Unable to dialyze through AVF/AVG. Access Site: Left Upper Extremity. Access Type: Upper arm straight graft. History: Mechanical complication AVG, eval fluid collection.          ESRD, hypertensive urgency. Comparison Study: No prior exam of the left upper extremity. Performing Technologist: Edilia Elden Appl  Examination Guidelines: A complete evaluation includes B-mode imaging, spectral Doppler, color Doppler, and power Doppler as needed of all accessible portions of each vessel. Unilateral testing is considered an integral part of a complete examination. Limited examinations for reoccurring indications may be performed as noted.  Findings: +--------------------+----------+-----------------+--------+ AVF                 PSV (cm/s)Flow Vol (mL/min)Comments  +--------------------+----------+-----------------+--------+ Native artery inflow   157           75                 +--------------------+----------+-----------------+--------+   +--------------------+----------+-----------------+--------+ AVG                 PSV (cm/s)Flow Vol (mL/min)Describe +--------------------+----------+-----------------+--------+ Native artery inflow   157           245                +--------------------+----------+-----------------+--------+ Arterial anastomosis   402                              +--------------------+----------+-----------------+--------+ Prox graft             300                              +--------------------+----------+-----------------+--------+ Mid graft              307                              +--------------------+----------+-----------------+--------+ Distal graft           512                              +--------------------+----------+-----------------+--------+ Venous anastomosis     510                              +--------------------+----------+-----------------+--------+ Venous outflow          66                              +--------------------+----------+-----------------+--------+ Complex structure note in the medial middle to distal upper arm measuring 5.4 x 2.7 x 3.6 cm.  Summary: Patent arteriovenous graft.  Complex structure noted in left upper arm.  *See table(s) above for measurements and observations.  Diagnosing physician: Fonda Rim Electronically signed by Fonda Rim on 01/23/2024 at 8:27:21 AM.   --------------------------------------------------------------------------------   Final    Medications:    amLODipine   10 mg Oral Daily   Chlorhexidine  Gluconate Cloth  6 each Topical Q0600   cloNIDine   0.3 mg Oral TID   divalproex   500 mg Oral Daily   heparin   5,000 Units Subcutaneous Q8H   hydrALAZINE   100 mg Oral TID   isosorbide  mononitrate  120 mg  Oral Daily    labetalol   200 mg Oral TID   losartan   100 mg Oral Daily   nicotine   14 mg Transdermal Daily   pantoprazole   40 mg Oral BID   sevelamer  carbonate  1,600 mg Oral TID WC   spironolactone   25 mg Oral Daily  Dialysis Orders:  MWF - [facility] 4hrs, BFR 350, DFR Auto 1.5,  EDW 73.8kg, 2K/ 2Ca Heparin  No bolus ordered Mircera 200 mcg q2wks - last 9/8 Calcitriol  0.75mcgmcg PO qHD - last 9/8 Sensipar 30mg  with HD - last 9/8   Last Labs: Hgb 9, K 4.1, Ca 9.4, P 4.5, Alb 2.8  Assessment/Plan: HTN Emergency: Cont ongoing titration of antiHTN meds by CCM/primary.   Rigors with dialysis: Patient c/o fevers/chills at his outpatient center after treatments lately. He's afebrile here and current WBC is stable. Bld cx here neg x 2d.  Happened again 9/11 - Used hyopallergenic Nipro dialyzer Friday and had no issues.  We don't have any more of these in the hospital - plan to re try the Revaclear with 2L saline flush Monday as he didn't have an anaphylactic reaction, rigors which were uncomfortable but not threatening.  If the rigors occur again I would recommend using the Nipro dialyzer going forward and the hosp and outpt HD will need to secure these.   Access: Has a R TDC and L AVG. Informed by the outpatient HD RN of patient c/o pain with cannulation with clots. Will use TDC for now until possible infection has been r/o. Vascular US  ordered today to eval fluid collections -  complex structure noted on US  - no s/s infection suspect an organized seroma - had f/u with Dr. Sheree but was hospitalized; will need to reschedule - I think this can be addressed outpt.  ESRD -  on HD MWF. Last HD 9/12, next 9/15. As above rinse dialyzer Hypertension/volume  - Not severely overloaded on exam and BP does not improve with UF. See above on HTN Anemia of CKD - Hgb 9 > 8s > 7s, ESA not due yet. Last dose 9/8 in outpatient - will dose tomorrow.  No frank bleeding noted Secondary Hyperparathyroidism -  Ca/phos  ok Nutrition - Watch K+ levels while on regular diet. Change to renal diet if K+ levels go up  Manuelita Barters MD 01/24/2024, 7:33 AM  [facility] Pager: [phone]

## 2024-01-24 NOTE — Progress Notes (Unsigned)
 {  Select_TRH_Note:[id number]}

## 2024-01-24 NOTE — Plan of Care (Signed)

## 2024-01-24 NOTE — Progress Notes (Signed)
 PROGRESS NOTE    [name]  FMW:[id number] DOB: 1985-07-20 DOA: 01/20/2024 PCP: Lonnie Earnest, MD   Brief Narrative:  38 year old man with PMHx significant for HTN (difficult to control), HFpEF (Echo 12/2023 with EF 55-60%, severe concentric LVH, G2DD, small to moderate pericardial effusion), Alport syndrome, ESRD on HD (MWFS via RIJ TDC), GERD, anemia of chronic disease, bipolar 1 disorder, hearing loss, depression, tobacco use. Recent admission 8/16 - 8/18 for malaise/body aches/GI symptoms/dizziness, presented to [facility] ED 9/10 for weakness, chills, body aches, fatigue, facial numbness x 2 weeks. Last HD 9/8. Patient states that he recently had COVID a few weeks ago and was recovering from that when he began to feel poorly again. Was tested for COVID again at HD and reportedly negative. Has had rigor-like shaking chills during HD and feels like he cannot get warm (notes that his house thermostat is set at 84 degrees post-treatment). He reports fever/chills, no CP/SOB, found to be COVID+ with hypertensive urgency.   En route with EMS, HR 86, BP 233/139, RR 86, SpO2 99% on RA. On ED arrival, patient was afebrile with HR 90s, BP 225/128, RR 21, SpO2 100%. Labs were notable for WBC 7.1, Hgb 8.3, Plt 255. Na 135, K 4.6, CO2 22, BUN/Cr 54/16.10. AST/ALT WNL, Alk Phos 139, Tbili 0.9. Trop 24. BNP 1736.2. CT Head NAICA. Labetalol  10mg  IV x 1 given without improvement in BP. Cardene  gtt was initiated.  Admitted under PCCM, eventually Cardene  stopped and he was transferred under TRH on 01/23/2024 for  Assessment & Plan:   Principal Problem:   Hypertensive crisis  Hypertensive crisis History of HTN, refractory/difficult to control c/b poor compliance/admitted with hypertensive crisis, POA: aldosterone-renin panel pending.  Initially needed Cardene  which has been discontinued.  When patient was transitioned under TRH, patient was on home dose of nifedipine  90 mg XL, hydralazine  100 mg 3 times daily,  labetalol  200 mg twice daily, losartan  100 mg daily and yet blood pressure was significantly uncontrolled with systolic in 200 range and diastolic in 100 range.  Transition his nifedipine  to amlodipine  and started him on Imdur  30 mg on 01/23/2023.  Blood pressure still uncontrolled, increased Imdur  to 60 mg.  Overnight, overnight, his blood pressure was still significantly elevated, night hospitalist spoke to intensivist, per his recommendations, Imdur  was increased to maximum dose of 120 mg and patient was started on clonidine  0.3 mg 3 times daily.  At this point in time, patient is maxed out on all the antihypertensive medications.  If blood pressure remains uncontrolled, he will need to be started back on Cardene  drip.   HFpEF / BLE edema Echo 12/2023 with EF 55-60%, severe concentric LVH, G2DD, small to moderate pericardial effusion. Optimize electrolytes for K > 4, Mg > 2.  Volume removal by HD   ESRD on HD (MWFS via RIJ TDC) Alport syndrome LUE AVG dysfunction Followed at [facility] for transplant evaluation. He tolerated 1hr of HD on 9/11 due to report of rigors and dizziness. No objective fever. Blood cultures were sent off.  - nephrology tried hypoallergenic dialyzer, no issues with that.  Cultures are negative and he is afebrile again.  Nephrology wants to try hypoallergenic dialyzer again on Monday.  Appreciate their help.   Diarrhea, acute-on-chronic Increased frequency of diarrhea, per patient.  Verified by nurses as well.  This is chronic, has been going on since about 3 weeks now.  Doubt infectious etiology since he is afebrile with no leukocytosis and no abdominal tenderness.  Will start  on Imodium .   Anemia of chronic disease - Trend H&H - Monitor for signs of active bleeding - Transfuse for Hgb < 7.0 or hemodynamically significant bleeding   GERD - PPI   Bipolar 1 disorder Depression - Continue home Depakote    Tobacco use - Encourage cessation - Nicotine  patch.  I have  discussed tobacco cessation with the patient.  I have counseled the patient regarding the negative impacts of continued tobacco use including but not limited to lung cancer, COPD, and cardiovascular disease.  I have discussed alternatives to tobacco and modalities that may help facilitate tobacco cessation including but not limited to biofeedback, hypnosis, and medications.  Total time spent with tobacco counseling was 5 minutes.   DVT prophylaxis: heparin  injection 5,000 Units Start: 01/21/24 0600 SCDs Start: 01/20/24 2230   Code Status: Full Code  Family Communication:  None present at bedside.  Plan of care discussed with patient in length and he/she verbalized understanding and agreed with it.  Status is: Inpatient Remains inpatient appropriate because: Hypertensive crisis    Estimated body mass index is 27.15 kg/m as calculated from the following:   Height as of this encounter: 5' 8 (1.727 m).   Weight as of this encounter: 81 kg.    Nutritional Assessment: Body mass index is 27.15 kg/m.SABRA Seen by dietician.  I agree with the assessment and plan as outlined below: Nutrition Status:        . Skin Assessment: I have examined the patient's skin and I agree with the wound assessment as performed by the wound care RN as outlined below:    Consultants:  Nephrology  Procedures:  none  Antimicrobials:  Anti-infectives (From admission, onward)    None         Subjective: In and examined, the only complaint patient has today is diarrhea.  No headache, chest pain or shortness of breath.  Objective: Vitals:   01/24/24 0430 01/24/24 0500 01/24/24 0530 01/24/24 0736  BP: (!) 206/106 (!) 189/102 (!) 196/108 (!) 207/129  Pulse:      Resp:      Temp:      TempSrc:      SpO2:      Weight:  81 kg    Height:       No intake or output data in the 24 hours ending 01/24/24 0752  Filed Weights   01/22/24 1844 01/23/24 0700 01/24/24 0500  Weight: 75.1 kg 76.5 kg 81 kg     Examination:  General exam: Appears calm and comfortable  Respiratory system: Clear to auscultation. Respiratory effort normal. Cardiovascular system: S1 & S2 heard, RRR. No JVD, murmurs, rubs, gallops or clicks. No pedal edema. Gastrointestinal system: Abdomen is nondistended, soft and nontender. No organomegaly or masses felt. Normal bowel sounds heard. Central nervous system: Alert and oriented. No focal neurological deficits. Extremities: Symmetric 5 x 5 power. Skin: No rashes, lesions or ulcers.  Psychiatry: Judgement and insight appear normal. Mood & affect appropriate.    Data Reviewed: I have personally reviewed following labs and imaging studies  CBC: Recent Labs  Lab 01/20/24 2100 01/21/24 0110 01/22/24 1022 01/23/24 0230 01/24/24 0332  WBC 7.1 8.3 10.3 5.6 5.6  NEUTROABS 5.1  --   --   --   --   HGB 8.3* 9.0* 8.8* 8.0* 7.7*  HCT 26.4* 28.0* 27.8* 25.7* 24.6*  MCV 89.8 88.3 90.0 89.9 89.8  PLT 255 280 195 179 206   Basic Metabolic Panel: Recent Labs  Lab 01/20/24  2100 01/21/24 0110 01/22/24 0757 01/23/24 0230 01/24/24 0332  NA 135 135 137 137 134*  K 4.6 4.1 4.8 4.4 5.1  CL 96* 95* 99 98 98  CO2 22 21* 15* 26 21*  GLUCOSE 107* 142* 104* 91 78  BUN 54* 54* 54* 36* 51*  CREATININE 16.10* 16.42* 15.46* 10.28* 12.39*  CALCIUM  8.9 9.4 8.9 8.6* 8.9  MG  --  2.3 2.1  --   --   PHOS  --  4.5 5.0*  --   --    GFR: Estimated Creatinine Clearance: 8 mL/min (A) (by C-G formula based on SCr of 12.39 mg/dL (H)). Liver Function Tests: Recent Labs  Lab 01/20/24 2100 01/22/24 0757  AST 26  --   ALT 13  --   ALKPHOS 139*  --   BILITOT 0.9  --   PROT 6.7  --   ALBUMIN  2.8* 2.9*   No results for input(s): LIPASE, AMYLASE in the last 168 hours. No results for input(s): AMMONIA in the last 168 hours. Coagulation Profile: No results for input(s): INR, PROTIME in the last 168 hours. Cardiac Enzymes: No results for input(s): CKTOTAL, CKMB,  CKMBINDEX, TROPONINI in the last 168 hours. BNP (last 3 results) No results for input(s): PROBNP in the last 8760 hours. HbA1C: No results for input(s): HGBA1C in the last 72 hours.  CBG: Recent Labs  Lab 01/21/24 0020  GLUCAP 103*   Lipid Profile: No results for input(s): CHOL, HDL, LDLCALC, TRIG, CHOLHDL, LDLDIRECT in the last 72 hours. Thyroid Function Tests: No results for input(s): TSH, T4TOTAL, FREET4, T3FREE, THYROIDAB in the last 72 hours. Anemia Panel: No results for input(s): VITAMINB12, FOLATE, FERRITIN, TIBC, IRON , RETICCTPCT in the last 72 hours. Sepsis Labs: No results for input(s): PROCALCITON, LATICACIDVEN in the last 168 hours.  Recent Results (from the past 240 hours)  MRSA Next Gen by PCR, Nasal     Status: None   Collection Time: 01/21/24 12:16 AM   Specimen: Nasal Mucosa; Nasal Swab  Result Value Ref Range Status   MRSA by PCR Next Gen NOT DETECTED NOT DETECTED Final    Comment: (NOTE) The GeneXpert MRSA Assay (FDA approved for NASAL specimens only), is one component of a comprehensive MRSA colonization surveillance program. It is not intended to diagnose MRSA infection nor to guide or monitor treatment for MRSA infections. Test performance is not FDA approved in patients less than 36 years old. Performed at [facility], [address], [place], [place] [address]   Culture, blood (Routine X 2) w Reflex to ID Panel     Status: None (Preliminary result)   Collection Time: 01/21/24 10:35 AM   Specimen: BLOOD  Result Value Ref Range Status   Specimen Description BLOOD LEFT ANTECUBITAL  Final   Special Requests   Final    BOTTLES DRAWN AEROBIC AND ANAEROBIC Blood Culture adequate volume   Culture   Final    NO GROWTH 2 DAYS Performed at [facility], [address] [place], [place] [address]    Report Status PENDING  Incomplete  Culture, blood (Routine X 2) w Reflex to ID Panel     Status: None  (Preliminary result)   Collection Time: 01/21/24 10:35 AM   Specimen: BLOOD  Result Value Ref Range Status   Specimen Description BLOOD BLOOD RIGHT HAND  Final   Special Requests   Final    BOTTLES DRAWN AEROBIC AND ANAEROBIC Blood Culture adequate volume   Culture  Final    NO GROWTH 2 DAYS Performed at [facility], [address] [place], [place] [address]    Report Status PENDING  Incomplete     Radiology Studies: VAS US  DUPLEX DIALYSIS ACCESS (AVF, AVG) Result Date: 01/23/2024 DIALYSIS ACCESS Patient Name:  LAMONTAE RICARDO [facility]  Date of Exam:   01/22/2024 Medical Rec #: [id number]        Accession #:    [id number] Date of Birth: Jul 26, 1985        Patient Gender: M Patient Age:   67 years Exam Location:  [facility] Procedure:      VAS US  DUPLEX DIALYSIS ACCESS (AVF, AVG) Referring Phys: MANUELITA BARTERS --------------------------------------------------------------------------------  Reason for Exam: Unable to dialyze through AVF/AVG. Access Site: Left Upper Extremity. Access Type: Upper arm straight graft. History: Mechanical complication AVG, eval fluid collection.          ESRD, hypertensive urgency. Comparison Study: No prior exam of the left upper extremity. Performing Technologist: Edilia Elden Appl  Examination Guidelines: A complete evaluation includes B-mode imaging, spectral Doppler, color Doppler, and power Doppler as needed of all accessible portions of each vessel. Unilateral testing is considered an integral part of a complete examination. Limited examinations for reoccurring indications may be performed as noted.  Findings: +--------------------+----------+-----------------+--------+ AVF                 PSV (cm/s)Flow Vol (mL/min)Comments +--------------------+----------+-----------------+--------+ Native artery inflow   157           75                 +--------------------+----------+-----------------+--------+    +--------------------+----------+-----------------+--------+ AVG                 PSV (cm/s)Flow Vol (mL/min)Describe +--------------------+----------+-----------------+--------+ Native artery inflow   157           245                +--------------------+----------+-----------------+--------+ Arterial anastomosis   402                              +--------------------+----------+-----------------+--------+ Prox graft             300                              +--------------------+----------+-----------------+--------+ Mid graft              307                              +--------------------+----------+-----------------+--------+ Distal graft           512                              +--------------------+----------+-----------------+--------+ Venous anastomosis     510                              +--------------------+----------+-----------------+--------+ Venous outflow          66                              +--------------------+----------+-----------------+--------+ Complex structure note in the medial middle to distal upper arm measuring 5.4  x 2.7 x 3.6 cm.  Summary: Patent arteriovenous graft.  Complex structure noted in left upper arm.  *See table(s) above for measurements and observations.  Diagnosing physician: Fonda Rim Electronically signed by Fonda Rim on 01/23/2024 at 8:27:21 AM.   --------------------------------------------------------------------------------   Final     Scheduled Meds:  amLODipine   10 mg Oral Daily   Chlorhexidine  Gluconate Cloth  6 each Topical Q0600   cloNIDine   0.3 mg Oral TID   divalproex   500 mg Oral Daily   heparin   5,000 Units Subcutaneous Q8H   hydrALAZINE   100 mg Oral TID   isosorbide  mononitrate  120 mg Oral Daily   labetalol   200 mg Oral TID   losartan   100 mg Oral Daily   nicotine   14 mg Transdermal Daily   pantoprazole   40 mg Oral BID   sevelamer  carbonate  1,600 mg Oral TID WC   spironolactone    25 mg Oral Daily   Continuous Infusions:   LOS: 4 days   Fredia Skeeter, MD [facility]  01/24/2024, 7:52 AM   *Please note that this is a verbal dictation therefore any spelling or grammatical errors are due to the Dragon Medical One system interpretation.  Please page via Amion and do not message via secure chat for urgent patient care matters. Secure chat can be used for non urgent patient care matters.  How to contact the [facility] Attending or Consulting provider 7A - 7P or covering provider during after hours 7P -7A, for this patient?  Check the care team in [facility] and look for a) attending/consulting TR[facility] provider listed and b) the [facility] team listed. Page or secure chat 7A-7P. Log into [email or web address] and use [facility] universal password to access. If you do not have the password, please contact the hospital operator. Locate the [facility] provider you are looking for under [facility] and page to a number that you can be directly reached. If you still have difficulty reaching the provider, please page the [name] (Director on Call) for the Hospitalists listed on amion for assistance.

## 2024-01-25 ENCOUNTER — Ambulatory Visit: Payer: Self-pay | Admitting: Student

## 2024-01-25 DIAGNOSIS — I169 Hypertensive crisis, unspecified: Secondary | ICD-10-CM | POA: Diagnosis not present

## 2024-01-25 LAB — BASIC METABOLIC PANEL WITH GFR
Anion gap: 16 — ABNORMAL HIGH (ref 5–15)
BUN: 65 mg/dL — ABNORMAL HIGH (ref 6–20)
CO2: 20 mmol/L — ABNORMAL LOW (ref 22–32)
Calcium: 8.8 mg/dL — ABNORMAL LOW (ref 8.9–10.3)
Chloride: 95 mmol/L — ABNORMAL LOW (ref 98–111)
Creatinine, Ser: 14.6 mg/dL — ABNORMAL HIGH (ref 0.61–1.24)
GFR, Estimated: 4 mL/min — ABNORMAL LOW (ref >60)
Glucose, Bld: 84 mg/dL (ref 70–99)
Potassium: 5.5 mmol/L — ABNORMAL HIGH (ref 3.5–5.1)
Sodium: 131 mmol/L — ABNORMAL LOW (ref 135–145)

## 2024-01-25 LAB — CBC
HCT: 24.2 % — ABNORMAL LOW (ref 39.0–52.0)
Hemoglobin: 7.5 g/dL — ABNORMAL LOW (ref 13.0–17.0)
MCH: 28 pg (ref 26.0–34.0)
MCHC: 31 g/dL (ref 30.0–36.0)
MCV: 90.3 fL (ref 80.0–100.0)
Platelets: 192 K/uL (ref 150–400)
RBC: 2.68 MIL/uL — ABNORMAL LOW (ref 4.22–5.81)
RDW: 17 % — ABNORMAL HIGH (ref 11.5–15.5)
WBC: 4.8 K/uL (ref 4.0–10.5)
nRBC: 0 % (ref 0.0–0.2)

## 2024-01-25 MED ORDER — CARVEDILOL 25 MG PO TABS
25.0000 mg | ORAL_TABLET | Freq: Two times a day (BID) | ORAL | Status: DC
  Filled 2024-01-25: qty 1

## 2024-01-25 MED ORDER — ACETAMINOPHEN 325 MG PO TABS
650.0000 mg | ORAL_TABLET | Freq: Four times a day (QID) | ORAL | Status: DC | PRN
  Administered 2024-01-25 – 2024-02-03 (×14): 650 mg via ORAL
  Filled 2024-01-25 (×11): qty 2

## 2024-01-25 MED ORDER — CARVEDILOL 12.5 MG PO TABS
12.5000 mg | ORAL_TABLET | Freq: Two times a day (BID) | ORAL | Status: DC
  Administered 2024-01-25 – 2024-01-26 (×4): 12.5 mg via ORAL
  Filled 2024-01-25 (×4): qty 1

## 2024-01-25 MED ORDER — LOSARTAN POTASSIUM 50 MG PO TABS
100.0000 mg | ORAL_TABLET | Freq: Every evening | ORAL | Status: DC
Start: 2024-01-26 — End: 2024-01-31
  Administered 2024-01-26 – 2024-01-30 (×5): 100 mg via ORAL
  Filled 2024-01-25 (×6): qty 2

## 2024-01-25 NOTE — Plan of Care (Signed)
°  Problem: Education: Goal: Knowledge of General Education information will improve Description: Including pain rating scale, medication(s)/side effects and non-pharmacologic comfort measures Outcome: Progressing   Problem: Health Behavior/Discharge Planning: Goal: Ability to manage health-related needs will improve Outcome: Progressing   Problem: Clinical Measurements: Goal: Ability to maintain clinical measurements within normal limits will improve Outcome: Progressing Goal: Will remain free from infection Outcome: Progressing Goal: Respiratory complications will improve Outcome: Progressing   Problem: Activity: Goal: Risk for activity intolerance will decrease Outcome: Progressing   Problem: Nutrition: Goal: Adequate nutrition will be maintained Outcome: Progressing   Problem: Skin Integrity: Goal: Risk for impaired skin integrity will decrease Outcome: Progressing

## 2024-01-25 NOTE — Progress Notes (Signed)
 PT Cancellation Note  Patient Details Name: [name] MRN: [id number] DOB: [birth year]   Cancelled Treatment:    Reason Eval/Treat Not Completed: Patient at procedure or test/unavailable Currently getting HD treatment. Will check back this afternoon to progress with rehab efforts.  Leontine Roads, PT, DPT [facility]  Rehabilitation Services Physical Therapist Office: [phone] Website: [email or web address]   [name] 01/25/2024, 9:11 AM

## 2024-01-25 NOTE — Progress Notes (Signed)
   01/25/24 1211  Vitals  Temp (!) 97 F (36.1 C)  Pulse Rate 87  Resp (!) 21  BP (!) 177/99  SpO2 96 %  O2 Device Room Air  Weight 79 kg (standing)  Type of Weight Post-Dialysis  Oxygen Therapy  Patient Activity (if Appropriate) In bed  Pulse Oximetry Type Continuous  Oximetry Probe Site Changed No  Post Treatment  Dialyzer Clearance Heavily streaked  Hemodialysis Intake (mL) 0 mL  Liters Processed 84  Fluid Removed (mL) 3000 mL  Tolerated HD Treatment Yes   Received patient in bed to unit.  Alert and oriented.  Informed consent signed and in chart.   TX duration:3.5hrs  Patient tolerated well.  Transported back to the room  Alert, without acute distress.  Hand-off given to patient's nurse.   Access used: [facility] Access issues: none  Total UF removed: 3L  Medication(s) given: bp meds see MAR    [facility]

## 2024-01-25 NOTE — Progress Notes (Signed)
 PROGRESS NOTE    [name]  FMW:[id number] DOB: 06/04/1985 DOA: 01/20/2024 PCP: Lonnie Earnest, MD   Brief Narrative:  38 year old man with PMHx significant for HTN (difficult to control), HFpEF (Echo 12/2023 with EF 55-60%, severe concentric LVH, G2DD, small to moderate pericardial effusion), Alport syndrome, ESRD on HD (MWFS via RIJ TDC), GERD, anemia of chronic disease, bipolar 1 disorder, hearing loss, depression, tobacco use. Recent admission 8/16 - 8/18 for malaise/body aches/GI symptoms/dizziness, presented to [facility] ED 9/10 for weakness, chills, body aches, fatigue, facial numbness x 2 weeks. Last HD 9/8. Patient states that he recently had COVID a few weeks ago and was recovering from that when he began to feel poorly again. Was tested for COVID again at HD and reportedly negative. Has had rigor-like shaking chills during HD and feels like he cannot get warm (notes that his house thermostat is set at 84 degrees post-treatment). He reports fever/chills, no CP/SOB, found to be COVID+ with hypertensive urgency.   En route with EMS, HR 86, BP 233/139, RR 86, SpO2 99% on RA. On ED arrival, patient was afebrile with HR 90s, BP 225/128, RR 21, SpO2 100%. Labs were notable for WBC 7.1, Hgb 8.3, Plt 255. Na 135, K 4.6, CO2 22, BUN/Cr 54/16.10. AST/ALT WNL, Alk Phos 139, Tbili 0.9. Trop 24. BNP 1736.2. CT Head NAICA. Labetalol  10mg  IV x 1 given without improvement in BP. Cardene  gtt was initiated.  Admitted under PCCM, eventually Cardene  stopped and he was transferred under TRH on 01/23/2024 for  Assessment & Plan:   Principal Problem:   Hypertensive crisis  Hypertensive crisis History of HTN, refractory/difficult to control c/b poor compliance/admitted with hypertensive crisis, POA: aldosterone-renin panel pending.  Initially needed Cardene  which has been discontinued.  When patient was transitioned under TRH, patient was on home dose of nifedipine  90 mg XL, hydralazine  100 mg 3 times daily,  labetalol  200 mg twice daily, losartan  100 mg daily and yet blood pressure was significantly uncontrolled with systolic in 200 range and diastolic in 100 range.  Transitioned his nifedipine  to amlodipine  and started him on Imdur  30 mg on 01/23/2023.  Blood pressure still uncontrolled, increased Imdur  to 60 mg.  His blood pressure was still elevated on the night of 01/23/2024, night hospitalist spoke to intensivist, per his recommendations, Imdur  was increased to maximum dose of 120 mg and patient was started on clonidine  0.3 mg 3 times daily.  Patient's blood pressure was finally somewhat controlled however patient declined taking labetalol  stating that labetalol  caused him to fall asleep and he becomes lethargic and does not feel well.  Someone discontinued this medication last night.  I personally tried to talk to him today to resume labetalol  but he said  I would absolutely not take that.  I had a lengthy discussion with the patient that if he would refuse the medications that is helping to control his blood pressure, he will be taking the risk of consequences of uncontrolled hypertension which includes stroke and heart attack at the least and that he will be responsible for that.  At this point in time, patient is maxed out on all the antihypertensive medications.  If blood pressure remains uncontrolled, he will need to be started back on Cardene  drip.  However his blood pressure appears to have improved significantly and I hope that it will further improve after dialysis today.  Nephrology has started him on Coreg  12.5 p.o. twice daily and is going to talk to him.   HFpEF /  BLE edema Echo 12/2023 with EF 55-60%, severe concentric LVH, G2DD, small to moderate pericardial effusion. Optimize electrolytes for K > 4, Mg > 2.  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 He tolerated 1hr of HD on 9/11 due to report of  rigors and dizziness. No objective fever. Blood cultures were sent off.  - nephrology tried hypoallergenic dialyzer, no issues with that.  Cultures are negative and he is afebrile again.  Nephrology wants to try hypoallergenic dialyzer again on today.  Appreciate their help.  Hyperkalemia: 5.5.  Nephrology managing.   Diarrhea, acute-on-chronic Increased frequency of diarrhea, per patient.  Verified by nurses as well.  This is chronic, has been going on since about 3 weeks now.  Doubt infectious etiology since he is afebrile with no leukocytosis and no abdominal tenderness.  Continue Imodium .  Diarrhea improving.   Anemia of chronic disease - Trend H&H - Monitor for signs of active bleeding - Transfuse for Hgb < 7.0 or hemodynamically significant bleeding.  Hemoglobin 7.5 today.   GERD - PPI   Bipolar 1 disorder Depression - Continue home Depakote    Tobacco use - Encourage cessation - Nicotine  patch.  I have discussed tobacco cessation with the patient.  I have counseled the patient regarding the negative impacts of continued tobacco use including but not limited to lung cancer, COPD, and cardiovascular disease.  I have discussed alternatives to tobacco and modalities that may help facilitate tobacco cessation including but not limited to biofeedback, hypnosis, and medications.  Total time spent with tobacco counseling was 5 minutes.   DVT prophylaxis: heparin  injection 5,000 Units Start: 01/21/24 0600 SCDs Start: 01/20/24 2230   Code Status: Full Code  Family Communication:  None present at bedside.  Plan of care discussed with patient in length and he/she verbalized understanding and agreed with it.  Status is: Inpatient Remains inpatient appropriate because: Hypertensive crisis    Estimated body mass index is 27.15 kg/m as calculated from the following:   Height as of this encounter: 5' 8 (1.727 m).   Weight as of this encounter: 81 kg.    Nutritional Assessment: Body  mass index is 27.15 kg/m.SABRA Seen by dietician.  I agree with the assessment and plan as outlined below: Nutrition Status:        . Skin Assessment: I have examined the patient's skin and I agree with the wound assessment as performed by the wound care RN as outlined below:    Consultants:  Nephrology  Procedures:  none  Antimicrobials:  Anti-infectives (From admission, onward)    None         Subjective: Seen and examined in the dialysis unit.  Patient upset for being prescribed labetalol .  Otherwise he had no other complaint.  He did not bring up any complaints about his diarrhea today.  Objective: Vitals:   01/25/24 0400 01/25/24 0500 01/25/24 0600 01/25/24 0700  BP: (!) 193/129 (!) 187/108 (!) 172/107 (!) 138/123  Pulse: 72 73  69  Resp: 14     Temp:      TempSrc:      SpO2: 100% 100%  94%  Weight:      Height:       No intake or output data in the 24 hours ending 01/25/24 0736  Filed Weights   01/22/24 1844 01/23/24 0700 01/24/24 0500  Weight: 75.1 kg 76.5 kg 81 kg  Examination:  General exam: Appears calm and comfortable  Respiratory system: Clear to auscultation. Respiratory effort normal. Cardiovascular system: S1 & S2 heard, RRR. No JVD, murmurs, rubs, gallops or clicks. No pedal edema. Gastrointestinal system: Abdomen is nondistended, soft and nontender. No organomegaly or masses felt. Normal bowel sounds heard. Central nervous system: Alert and oriented. No focal neurological deficits. Extremities: Symmetric 5 x 5 power. Skin: No rashes, lesions or ulcers.  Psychiatry: Judgement and insight appear poor  Data Reviewed: I have personally reviewed following labs and imaging studies  CBC: Recent Labs  Lab 01/20/24 2100 01/21/24 0110 01/22/24 1022 01/23/24 0230 01/24/24 0332 01/25/24 0327  WBC 7.1 8.3 10.3 5.6 5.6 4.8  NEUTROABS 5.1  --   --   --   --   --   HGB 8.3* 9.0* 8.8* 8.0* 7.7* 7.5*  HCT 26.4* 28.0* 27.8* 25.7* 24.6* 24.2*   MCV 89.8 88.3 90.0 89.9 89.8 90.3  PLT 255 280 195 179 206 192   Basic Metabolic Panel: Recent Labs  Lab 01/21/24 0110 01/22/24 0757 01/23/24 0230 01/24/24 0332 01/25/24 0327  NA 135 137 137 134* 131*  K 4.1 4.8 4.4 5.1 5.5*  CL 95* 99 98 98 95*  CO2 21* 15* 26 21* 20*  GLUCOSE 142* 104* 91 78 84  BUN 54* 54* 36* 51* 65*  CREATININE 16.42* 15.46* 10.28* 12.39* 14.60*  CALCIUM  9.4 8.9 8.6* 8.9 8.8*  MG 2.3 2.1  --   --   --   PHOS 4.5 5.0*  --   --   --    GFR: Estimated Creatinine Clearance: 6.8 mL/min (A) (by C-G formula based on SCr of 14.6 mg/dL (H)). Liver Function Tests: Recent Labs  Lab 01/20/24 2100 01/22/24 0757  AST 26  --   ALT 13  --   ALKPHOS 139*  --   BILITOT 0.9  --   PROT 6.7  --   ALBUMIN  2.8* 2.9*   No results for input(s): LIPASE, AMYLASE in the last 168 hours. No results for input(s): AMMONIA in the last 168 hours. Coagulation Profile: No results for input(s): INR, PROTIME in the last 168 hours. Cardiac Enzymes: No results for input(s): CKTOTAL, CKMB, CKMBINDEX, TROPONINI in the last 168 hours. BNP (last 3 results) No results for input(s): PROBNP in the last 8760 hours. HbA1C: No results for input(s): HGBA1C in the last 72 hours.  CBG: Recent Labs  Lab 01/21/24 0020 01/24/24 1903  GLUCAP 103* 123*   Lipid Profile: No results for input(s): CHOL, HDL, LDLCALC, TRIG, CHOLHDL, LDLDIRECT in the last 72 hours. Thyroid Function Tests: No results for input(s): TSH, T4TOTAL, FREET4, T3FREE, THYROIDAB in the last 72 hours. Anemia Panel: No results for input(s): VITAMINB12, FOLATE, FERRITIN, TIBC, IRON , RETICCTPCT in the last 72 hours. Sepsis Labs: No results for input(s): PROCALCITON, LATICACIDVEN in the last 168 hours.  Recent Results (from the past 240 hours)  MRSA Next Gen by PCR, Nasal     Status: None   Collection Time: 01/21/24 12:16 AM   Specimen: Nasal Mucosa; Nasal Swab   Result Value Ref Range Status   MRSA by PCR Next Gen NOT DETECTED NOT DETECTED Final    Comment: (NOTE) The GeneXpert MRSA Assay (FDA approved for NASAL specimens only), is one component of a comprehensive MRSA colonization surveillance program. It is not intended to diagnose MRSA infection nor to guide or monitor treatment for MRSA infections. Test performance is not FDA approved in patients less than 14 years old. Performed  at [facility], [address], [place], [place] [address]   Culture, blood (Routine X 2) w Reflex to ID Panel     Status: None (Preliminary result)   Collection Time: 01/21/24 10:35 AM   Specimen: BLOOD  Result Value Ref Range Status   Specimen Description BLOOD LEFT ANTECUBITAL  Final   Special Requests   Final    BOTTLES DRAWN AEROBIC AND ANAEROBIC Blood Culture adequate volume   Culture   Final    NO GROWTH 3 DAYS Performed at [facility], [address] [place], [place] [address]    Report Status PENDING  Incomplete  Culture, blood (Routine X 2) w Reflex to ID Panel     Status: None (Preliminary result)   Collection Time: 01/21/24 10:35 AM   Specimen: BLOOD  Result Value Ref Range Status   Specimen Description BLOOD BLOOD RIGHT HAND  Final   Special Requests   Final    BOTTLES DRAWN AEROBIC AND ANAEROBIC Blood Culture adequate volume   Culture   Final    NO GROWTH 3 DAYS Performed at [facility], [address] [place], [place] [address]    Report Status PENDING  Incomplete     Radiology Studies: No results found.   Scheduled Meds:  amLODipine   10 mg Oral Daily   Chlorhexidine  Gluconate Cloth  6 each Topical Q0600   cloNIDine   0.3 mg Oral TID   darbepoetin (ARANESP ) injection - DIALYSIS  150 mcg Subcutaneous Q Mon-1800   divalproex   500 mg Oral Daily   heparin   5,000 Units Subcutaneous Q8H   hydrALAZINE   100 mg Oral TID   isosorbide  mononitrate  120 mg Oral Daily   labetalol   200 mg Oral TID   losartan   100 mg Oral  Daily   nicotine   14 mg Transdermal Daily   pantoprazole   40 mg Oral BID   sevelamer  carbonate  1,600 mg Oral TID WC   spironolactone   25 mg Oral Daily   Continuous Infusions:   LOS: 5 days   Fredia Skeeter, MD [facility]  01/25/2024, 7:36 AM   *Please note that this is a verbal dictation therefore any spelling or grammatical errors are due to the Dragon Medical One system interpretation.  Please page via Amion and do not message via secure chat for urgent patient care matters. Secure chat can be used for non urgent patient care matters.  How to contact the [facility] Attending or Consulting provider 7A - 7P or covering provider during after hours 7P -7A, for this patient?  Check the care team in [facility] and look for a) attending/consulting TR[facility] provider listed and b) the [facility] team listed. Page or secure chat 7A-7P. Log into [email or web address] and use [facility]'s universal password to access. If you do not have the password, please contact the hospital operator. Locate the [facility] provider you are looking for under [facility] and page to a number that you can be directly reached. If you still have difficulty reaching the provider, please page the [facility] (Director on Call) for the Hospitalists listed on amion for assistance.

## 2024-01-25 NOTE — Progress Notes (Signed)
 [facility] Progress Note   Subjective:    Pt states he can't be on labetalol , had severe side effects mostly sedation, sleepiness and passing out Pt states can't be on high dose of carvedilol , gives him severe HA's, he does want want to try though a lower dose   Objective Vitals:   01/25/24 1300 01/25/24 1330 01/25/24 1400 01/25/24 1430  BP: (!) 194/119 (!) 208/121 (!) 199/116 (!) 193/120  Pulse: 82 79 82 81  Resp: (!) 25 (!) 24 (!) 24 (!) 24  Temp: (!) 97 F (36.1 C)     TempSrc: Oral     SpO2: 94% 94% 94% 97%  Weight:      Height:       Physical Exam General: calm this morning Heart:RRR Lungs: clear Abdomen:soft Extremities:trace edema Dialysis Access: RIJ TDC c/d/I, L AVG +t/b with swelling noted - no erythema or tenderness, no fluctuance  Dialysis Orders:  MWF NW  4h  B350   72.8kg  2K bath  Heparin  none  RIJ TDC Mircera 200 mcg q2wks - last 9/8 Calcitriol  0.92mcgmcg PO qHD - last 9/8 Sensipar 30mg  with HD - last 9/8    Assessment/Plan: HTN Emergency: current regimen -->  - amlodipine  10mg  every day - coreg  12.5 bid (lowered from 25 bid due to hx severe HA's at higher dosing) - clonidine  0.3 tid - hydralazine  100 tid -  labetalol  dc'd --> pt states he has severe side effects - losartan  100mg  --> changed it to evening as at home - he remains 5kg over dry wt which is driving BP's up  Rigors with dialysis: Patient c/o fevers/chills at his outpatient center after treatments lately. He's afebrile here and current WBC is stable. Bld cx here neg x 2d.  Happened again 9/11 - Used hyopallergenic Nipro dialyzer Friday and had no issues.  We don't have any more of these in the hospital - plan to re try the Revaclear with 2L saline flush Monday as he didn't have an anaphylactic reaction, rigors which were uncomfortable but not threatening.  If the rigors occur again I would recommend using the Nipro dialyzer going forward and the hosp and outpt HD will need to  secure these. He did fine today (9/15) w/ 2 L rinse.   Access: Has a R TDC and L AVG. Informed by the outpatient HD RN of patient c/o pain with cannulation with clots. Will use TDC for now until possible infection has been r/o. Vascular US  ordered today to eval fluid collections -  complex structure noted on US  - no s/s infection suspect an organized seroma - had f/u with Dr. Sheree but was hospitalized; will need to reschedule - hopefully this can be addressed outpt.  ESRD -  on HD MWF. Next HD today.  Volume - sig vol overload, 5kg up post HD today. High UF goal next HD Anemia of CKD - Hgb 9 > 8s > 7s, ESA not due yet. Last dose 9/8 in outpatient - will dose tomorrow.  No frank bleeding noted Secondary Hyperparathyroidism -  Ca/phos ok Nutrition - Watch K+ levels while on regular diet. Change to renal diet if K+ levels go up  Myer Fret  MD  [facility] 01/25/2024, 3:40 PM  Recent Labs  Lab 01/20/24 2100 01/21/24 0110 01/22/24 0757 01/22/24 1022 01/24/24 0332 01/25/24 0327  HGB 8.3* 9.0*  --    < > 7.7* 7.5*  ALBUMIN  2.8*  --  2.9*  --   --   --  CALCIUM  8.9 9.4 8.9   < > 8.9 8.8*  PHOS  --  4.5 5.0*  --   --   --   CREATININE 16.10* 16.42* 15.46*   < > 12.39* 14.60*  K 4.6 4.1 4.8   < > 5.1 5.5*   < > = values in this interval not displayed.    Inpatient medications:  amLODipine   10 mg Oral Daily   carvedilol   12.5 mg Oral BID   Chlorhexidine  Gluconate Cloth  6 each Topical Q0600   cloNIDine   0.3 mg Oral TID   darbepoetin (ARANESP ) injection - DIALYSIS  150 mcg Subcutaneous Q Mon-1800   divalproex   500 mg Oral Daily   heparin   5,000 Units Subcutaneous Q8H   hydrALAZINE   100 mg Oral TID   isosorbide  mononitrate  120 mg Oral Daily   [START ON 01/26/2024] losartan   100 mg Oral QPM   nicotine   14 mg Transdermal Daily   pantoprazole   40 mg Oral BID   sevelamer  carbonate  1,600 mg Oral TID WC   spironolactone   25 mg Oral Daily    acetaminophen , calcium  carbonate, docusate sodium ,  hydrALAZINE , hydrOXYzine , loperamide , metoCLOPramide  (REGLAN ) injection, ondansetron , mouth rinse, polyethylene glycol

## 2024-01-25 NOTE — Progress Notes (Signed)
 Physical Therapy Treatment Patient Details Name: [name] MRN: [id number] DOB: [birth year] Today's Date: 01/25/2024   History of Present Illness 38 yo male presenting 9/10 with weakness, chills, body aches and general malaise facial numbness x2 weeks. Recent admission 8/16 - 8/18 COVID (+) with hypertension urgency PMH HTN HFpEF Alport syndrome ESRD on HD ( MWFS) GERD Anemia bipolar 1 hearing loss depression tobacco use    PT Comments  Eager to mobilize with therapy. Did not exert much due to elevated BP today which was SBP <200 prior to activity but up to 209 in sitting after ambulating (RN notified.) Pt states he has had a headache most of the day. Tolerated gait training without overt LOB or buckling but still a little unsteady. Required one standing rest break towards end of distance and felt as if LEs would buckle but did not. SpO2 92% while ambulating on RA, up to 96% at rest. Patient will continue to benefit from skilled physical therapy services to further improve independence with functional mobility.    If plan is discharge home, recommend the following: Assist for transportation   Can travel by private vehicle        Equipment Recommendations  None recommended by PT    Recommendations for Other Services       Precautions / Restrictions Precautions Precautions: Fall Recall of Precautions/Restrictions: Intact Restrictions Weight Bearing Restrictions Per Provider Order: No     Mobility  Bed Mobility Overal bed mobility: Needs Assistance, Modified Independent             General bed mobility comments: No assist to rise to EOB.    Transfers Overall transfer level: Needs assistance Equipment used: None Transfers: Sit to/from Stand Sit to Stand: Contact guard assist           General transfer comment: CGA for safety, grossly stable without RW.    Ambulation/Gait Ambulation/Gait assistance: Contact guard assist, +2 safety/equipment Gait Distance  (Feet): 300 Feet Assistive device: None Gait Pattern/deviations: Step-through pattern, Decreased stride length, Decreased stance time - right, Antalgic Gait velocity: dec Gait velocity interpretation: <1.8 ft/sec, indicate of risk for recurrent falls   General Gait Details: Grossly stable today with mild antalgic patterning, no overt buckling but pt reports feeling as if LEs will buckle towards end of distance. Required one standing rest break to lean against wall. Reports SOB, SpO2 92-96% on RA.   Stairs             Wheelchair Mobility     Tilt Bed    Modified Rankin (Stroke Patients Only)       Balance Overall balance assessment: Mild deficits observed, not formally tested                                          Communication Communication Communication: Impaired Factors Affecting Communication: Hearing impaired  Cognition Arousal: Alert Behavior During Therapy: WFL for tasks assessed/performed   PT - Cognitive impairments: No apparent impairments                         Following commands: Intact      Cueing Cueing Techniques: Verbal cues, Other (comments) (written at times due to impaired hearing)  Exercises      General Comments General comments (skin integrity, edema, etc.): SBP <200 prior to activity, up to 209 sitting in recliner  at end of visit. RN notified. + Headache      Pertinent Vitals/Pain Pain Assessment Pain Assessment: Faces Faces Pain Scale: Hurts little more Pain Location: head Pain Descriptors / Indicators: Aching Pain Intervention(s): Limited activity within patient's tolerance, Monitored during session, Repositioned    Home Living                          Prior Function            PT Goals (current goals can now be found in the care plan section) Acute Rehab PT Goals Patient Stated Goal: Get back into shape. Exercise again PT Goal Formulation: With patient Time For Goal Achievement:  02/05/24 Potential to Achieve Goals: Good Progress towards PT goals: Progressing toward goals    Frequency    Min 2X/week      PT Plan      Co-evaluation              AM-PAC PT 6 Clicks Mobility   Outcome Measure  Help needed turning from your back to your side while in a flat bed without using bedrails?: A Little Help needed moving from lying on your back to sitting on the side of a flat bed without using bedrails?: A Little Help needed moving to and from a bed to a chair (including a wheelchair)?: A Little Help needed standing up from a chair using your arms (e.g., wheelchair or bedside chair)?: A Little Help needed to walk in hospital room?: A Little Help needed climbing 3-5 steps with a railing? : A Little 6 Click Score: 18    End of Session   Activity Tolerance: Patient tolerated treatment well Patient left: with call bell/phone within reach;in chair;with chair alarm set Nurse Communication: Mobility status PT Visit Diagnosis: Unsteadiness on feet (R26.81);Other abnormalities of gait and mobility (R26.89);Muscle weakness (generalized) (M62.81);Difficulty in walking, not elsewhere classified (R26.2);Pain Pain - part of body:  (bil LEs and head)     Time: 8440-8382 PT Time Calculation (min) (ACUTE ONLY): 18 min  Charges:    $Gait Training: 8-22 mins PT General Charges $$ ACUTE PT VISIT: 1 Visit                     Leontine Roads, PT, DPT [facility]  Rehabilitation Services Physical Therapist Office: [phone] Website: [email or web address]    [name] 01/25/2024, 4:28 PM

## 2024-01-26 ENCOUNTER — Inpatient Hospital Stay (HOSPITAL_COMMUNITY)

## 2024-01-26 DIAGNOSIS — Z992 Dependence on renal dialysis: Secondary | ICD-10-CM

## 2024-01-26 DIAGNOSIS — I169 Hypertensive crisis, unspecified: Secondary | ICD-10-CM | POA: Diagnosis not present

## 2024-01-26 DIAGNOSIS — N186 End stage renal disease: Secondary | ICD-10-CM

## 2024-01-26 DIAGNOSIS — T82838A Hemorrhage of vascular prosthetic devices, implants and grafts, initial encounter: Secondary | ICD-10-CM

## 2024-01-26 LAB — CULTURE, BLOOD (ROUTINE X 2)
Culture: NO GROWTH
Culture: NO GROWTH
Special Requests: ADEQUATE
Special Requests: ADEQUATE

## 2024-01-26 LAB — BASIC METABOLIC PANEL WITH GFR
Anion gap: 14 (ref 5–15)
BUN: 47 mg/dL — ABNORMAL HIGH (ref 6–20)
CO2: 24 mmol/L (ref 22–32)
Calcium: 8.9 mg/dL (ref 8.9–10.3)
Chloride: 91 mmol/L — ABNORMAL LOW (ref 98–111)
Creatinine, Ser: 10.55 mg/dL — ABNORMAL HIGH (ref 0.61–1.24)
GFR, Estimated: 6 mL/min — ABNORMAL LOW (ref >60)
Glucose, Bld: 85 mg/dL (ref 70–99)
Potassium: 5 mmol/L (ref 3.5–5.1)
Sodium: 129 mmol/L — ABNORMAL LOW (ref 135–145)

## 2024-01-26 LAB — CBC
HCT: 24.9 % — ABNORMAL LOW (ref 39.0–52.0)
Hemoglobin: 7.9 g/dL — ABNORMAL LOW (ref 13.0–17.0)
MCH: 28 pg (ref 26.0–34.0)
MCHC: 31.7 g/dL (ref 30.0–36.0)
MCV: 88.3 fL (ref 80.0–100.0)
Platelets: 156 K/uL (ref 150–400)
RBC: 2.82 MIL/uL — ABNORMAL LOW (ref 4.22–5.81)
RDW: 17 % — ABNORMAL HIGH (ref 11.5–15.5)
WBC: 4.6 K/uL (ref 4.0–10.5)
nRBC: 0 % (ref 0.0–0.2)

## 2024-01-26 LAB — TROPONIN I (HIGH SENSITIVITY)
Troponin I (High Sensitivity): 34 ng/L — ABNORMAL HIGH (ref <18)
Troponin I (High Sensitivity): 35 ng/L — ABNORMAL HIGH (ref <18)

## 2024-01-26 LAB — GLUCOSE, CAPILLARY: Glucose-Capillary: 74 mg/dL (ref 70–99)

## 2024-01-26 MED ORDER — CHLORHEXIDINE GLUCONATE CLOTH 2 % EX PADS
6.0000 | MEDICATED_PAD | Freq: Every day | CUTANEOUS | Status: DC
Start: 2024-01-27 — End: 2024-02-01
  Administered 2024-01-27 – 2024-02-01 (×3): 6 via TOPICAL

## 2024-01-26 MED ORDER — NITROGLYCERIN 0.4 MG SL SUBL
0.4000 mg | SUBLINGUAL_TABLET | SUBLINGUAL | Status: DC | PRN
  Administered 2024-01-26 – 2024-02-03 (×7): 0.4 mg via SUBLINGUAL
  Filled 2024-01-26 (×5): qty 1

## 2024-01-26 NOTE — Plan of Care (Addendum)
 Patient is complaining about chest pain and chest pressure.  Is not improving with sublingual nitroglycerin . EKG obtained which is showing normal sinus rhythm heart rate 67 without any ST to abnormality. Flat troponin 42, 24, 28 and 34.  Demand ischemia. -Patient maxed on 6 different blood pressure regimen and stated blood pressure in upper 180s range.  Also on dialysis.  Uncontrolled hypertension. Patient mentioning that he has trouble breathing even though O2 sat 100% on 2 L oxygen getting chest x-ray to see if patient is developing pulmonary edema or not.  If patient develop flash pulmonary edema will start nitro drip and informed nephrology to see if patient is dialysis or not.   Update, chest x-ray no evidence of flash pulmonary edema.  Systolic blood pressure is still in upper 170-180s range.   EKG and troponin unremarkable.  Continue current management. Given all the blood pressure medication has been exhausted and patient already on dialysis goal blood pressure patient probably will going home with blood pressure of 170-180/90-100.  Per chart review nephrology knows patient very well and per nephrology blood pressure 180/105 or less okay to discharge to home.   -Inform RN if patient blood pressure goes above 190 or 200 range please inform.   Bayron Dalto, MD [facility] 01/26/2024, 10:10 PM

## 2024-01-26 NOTE — Progress Notes (Signed)
  [facility] Progress Note   Subjective:      Objective Vitals:   01/25/24 2312 01/26/24 0427 01/26/24 0622 01/26/24 0902  BP: (!) 176/115 (!) 182/118  (!) 179/117  Pulse: 76 68 91 77  Resp: (!) 24 12 17 16   Temp: 97.9 F (36.6 C) 98 F (36.7 C)  98 F (36.7 C)  TempSrc: Oral Oral  Oral  SpO2: 99% 98% 100% 100%  Weight:   79.9 kg   Height:       Physical Exam General: calm this morning Heart:RRR Lungs: clear Abdomen:soft Extremities:trace edema Dialysis Access: RIJ TDC c/d/I, L AVG +t/b with swelling noted - no erythema or tenderness, no fluctuance  Dialysis Orders:  MWF [facility]  4h  B350   72.8kg  2K bath  Heparin  none  RIJ TDC Mircera 200 mcg q2wks - last 9/8 Calcitriol  0.64mcgmcg PO qHD - last 9/8 Sensipar 30mg  with HD - last 9/8    Assessment/Plan: HTN Emergency: current regimen -->  - amlodipine  10mg  every day - coreg  12.5 bid (lowered from 25 bid due to hx severe HA's at higher dosing) - clonidine  0.3 tid - hydralazine  100 tid -  labetalol  dc'd --> (severe side effects) - losartan  100mg  --> changed it to evening as at home - he remains 6-8kg over dry wt which is driving BP up - plan HD tomorrow for further volume correction  Rigors with dialysis: Patient c/o fevers/chills at his outpatient center after treatments lately. He's afebrile here and current WBC is stable. Bld cx here neg x 2d.  Happened again 9/11 - Used hyopallergenic Nipro dialyzer Friday and had no issues.  We don't have any more of these in the hospital - plan to re try the Revaclear with 2L saline flush Monday as he didn't have an anaphylactic reaction, rigors which were uncomfortable but not threatening.  If the rigors occur again I would recommend using the Nipro dialyzer going forward and the hosp and outpt HD will need to secure these. He did fine 9/15 w/ 2 L rinse.   Access: Has a R TDC and L AVG. Per OP HD RN pt has been having pain on cannulation, difficult cannulation and  sometimes pulling clots. Plan is to consult VVS while in hospital given his BP instability issues.  ESRD -  on HD MWF. Next HD tomorrow.  Volume - sig vol overload, up 6 kg still today after large UF yest. Cont to lower vol w/ HD.  Anemia of CKD - Hgb 9 > 8s > 7s, ESA not due yet. Last dose 9/8 in outpatient, getting darbe 150 micrograms weekly sq 1st dose 9/15.  No frank bleeding noted Secondary Hyperparathyroidism -  Ca/phos ok Nutrition - K+ levels are up, will switch back to renal diet  Myer Fret  MD  [facility] 01/26/2024, 11:22 AM  Recent Labs  Lab 01/20/24 2100 01/21/24 0110 01/22/24 0757 01/22/24 1022 01/25/24 0327 01/26/24 0506  HGB 8.3* 9.0*  --    < > 7.5* 7.9*  ALBUMIN  2.8*  --  2.9*  --   --   --   CALCIUM  8.9 9.4 8.9   < > 8.8* 8.9  PHOS  --  4.5 5.0*  --   --   --   CREATININE 16.10* 16.42* 15.46*   < > 14.60* 10.55*  K 4.6 4.1 4.8   < > 5.5* 5.0   < > = values in this interval not displayed.    Inpatient medications:  amLODipine   10 mg Oral Daily   carvedilol   12.5 mg Oral BID   Chlorhexidine  Gluconate Cloth  6 each Topical Q0600   cloNIDine   0.3 mg Oral TID   darbepoetin (ARANESP ) injection - DIALYSIS  150 mcg Subcutaneous Q Mon-1800   divalproex   500 mg Oral Daily   heparin   5,000 Units Subcutaneous Q8H   hydrALAZINE   100 mg Oral TID   isosorbide  mononitrate  120 mg Oral Daily   losartan   100 mg Oral QPM   nicotine   14 mg Transdermal Daily   pantoprazole   40 mg Oral BID   sevelamer  carbonate  1,600 mg Oral TID WC   spironolactone   25 mg Oral Daily    acetaminophen , calcium  carbonate, docusate sodium , hydrALAZINE , hydrOXYzine , loperamide , metoCLOPramide  (REGLAN ) injection, ondansetron , mouth rinse, polyethylene glycol

## 2024-01-26 NOTE — Consult Note (Signed)
 Hospital Consult    Reason for Consult: End-stage renal disease, malfunctioning left arm AV graft, fluid collection around left arm AV graft Referring Physician: Dr. Geralynn MRN #:  [id number]  History of Present Illness: This is a 38 y.o. male history of end-stage renal disease with multiple previous access procedures.  He is now admitted with hypertensive emergency blood pressure much better controlled.  He states they have had difficulty pulling clots in the left arm AV graft that was most recently placed at [facility] in July.  He also has a fluid collection around the access site.  He denies any warmth or redness and has not had any drainage from the graft site.  He currently has a tunneled dialysis catheter by which he is receiving dialysis.  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   Heart murmur    Hypertension    Low blood sugar    Marijuana abuse    Noncompliance    Pericardial effusion 03/28/2023   Tobacco abuse     Past Surgical History:  Procedure Laterality Date   A/V FISTULAGRAM Right 10/27/2022   Procedure: A/V Fistulagram;  Surgeon: Eliza Lonni RAMAN, MD;  Location: [facility] INVASIVE CV LAB;  Service: Cardiovascular;  Laterality: Right;   APPENDECTOMY     AV FISTULA PLACEMENT Left 03/03/2019   Procedure: ARTERIOVENOUS (AV) FISTULA CREATION LEFT ARM;  Surgeon: Sheree Penne Lonni, MD;  Location: [facility] OR;  Service: Vascular;  Laterality: Left;   BASCILIC VEIN TRANSPOSITION Right 04/12/2019   Procedure: RIGHT UPPER EXTREMITY BASCILIC VEIN TRANSPOSITION FIRST STAGE FISTULA;  Surgeon: Eliza Lonni RAMAN, MD;  Location: [facility] OR;  Service:  Vascular;  Laterality: Right;   BIOPSY  10/14/2021   Procedure: BIOPSY;  Surgeon: Legrand Victory LITTIE DOUGLAS, MD;  Location: [facility] ENDOSCOPY;  Service: Gastroenterology;;   BIOPSY  06/06/2022   Procedure: BIOPSY;  Surgeon: Shila Gustav GAILS, MD;  Location: [facility] ENDOSCOPY;  Service: Gastroenterology;;   BIOPSY  11/12/2022   Procedure: BIOPSY;  Surgeon: Wilhelmenia Aloha Raddle., MD;  Location: [facility] ENDOSCOPY;  Service: Gastroenterology;;   BIOPSY  12/05/2022   Procedure: BIOPSY;  Surgeon: Stacia Glendia BRAVO, MD;  Location: [facility] ENDOSCOPY;  Service: Gastroenterology;;   COLONOSCOPY WITH PROPOFOL  N/A 10/14/2021   Procedure: COLONOSCOPY WITH PROPOFOL ;  Surgeon: Legrand Victory LITTIE DOUGLAS, MD;  Location: [facility] ENDOSCOPY;  Service: Gastroenterology;  Laterality: N/A;   DIALYSIS/PERMA CATHETER INSERTION N/A 04/03/2023   Procedure: DIALYSIS/PERMA CATHETER INSERTION;  Surgeon: Magda Debby SAILOR, MD;  Location: [facility] INVASIVE CV LAB;  Service: Cardiovascular;  Laterality: N/A;   ESOPHAGOGASTRODUODENOSCOPY N/A 11/12/2022   Procedure: ESOPHAGOGASTRODUODENOSCOPY (EGD);  Surgeon: Wilhelmenia Aloha Raddle., MD;  Location: [facility] ENDOSCOPY;  Service: Gastroenterology;  Laterality: N/A;   ESOPHAGOGASTRODUODENOSCOPY N/A 12/05/2022   Procedure: ESOPHAGOGASTRODUODENOSCOPY (EGD);  Surgeon: Stacia Glendia BRAVO, MD;  Location: [facility] ENDOSCOPY;  Service: Gastroenterology;  Laterality: N/A;   ESOPHAGOGASTRODUODENOSCOPY (EGD) WITH PROPOFOL  N/A 06/06/2022   Procedure: ESOPHAGOGASTRODUODENOSCOPY (EGD) WITH PROPOFOL ;  Surgeon: Shila Gustav GAILS, MD;  Location: [facility] ENDOSCOPY;  Service: Gastroenterology;  Laterality: N/A;   FISTULA SUPERFICIALIZATION Right 11/07/2022  Procedure: PLICATION OF LARGE ANEURYSMS OF RIGHT ARTERIOVENOUS FISTULA WITH INSERTION OF 7mm INTERPOSITIONAL GORTEX GRAFT;  Surgeon: Eliza Lonni RAMAN, MD;  Location: [facility] OR;  Service: Vascular;  Laterality: Right;   FLEXIBLE SIGMOIDOSCOPY N/A 04/14/2022   Procedure: FLEXIBLE SIGMOIDOSCOPY;  Surgeon:  Shila Gustav GAILS, MD;  Location: [facility] ENDOSCOPY;  Service: Gastroenterology;  Laterality: N/A;   INSERTION OF DIALYSIS CATHETER N/A 11/07/2022   Procedure: INSERTION OF RIGHT INTERNAL JUGULAR TUNNELED DIALYSIS CATHETER;  Surgeon: Eliza Lonni RAMAN, MD;  Location: [facility] OR;  Service: Vascular;  Laterality: N/A;   LIGATION OF ARTERIOVENOUS  FISTULA Left 03/06/2019   Procedure: LIGATION OF ARTERIOVENOUS  FISTULA;  Surgeon: Gretta Lonni PARAS, MD;  Location: [facility] OR;  Service: Vascular;  Laterality: Left;   PERIPHERAL VASCULAR BALLOON ANGIOPLASTY  10/27/2022   Procedure: PERIPHERAL VASCULAR BALLOON ANGIOPLASTY;  Surgeon: Eliza Lonni RAMAN, MD;  Location: [facility] INVASIVE CV LAB;  Service: Cardiovascular;;  rt upper arm fistula   SAVORY DILATION N/A 11/12/2022   Procedure: SAVORY DILATION;  Surgeon: Wilhelmenia Aloha Raddle., MD;  Location: [facility] ENDOSCOPY;  Service: Gastroenterology;  Laterality: N/A;   spinal tap     SPINE SURGERY     related to a spinal infection, unsure of surgery or infection source   TRANSESOPHAGEAL ECHOCARDIOGRAM (CATH LAB) N/A 04/02/2023   Procedure: TRANSESOPHAGEAL ECHOCARDIOGRAM;  Surgeon: Jeffrie Oneil BROCKS, MD;  Location: [facility] INVASIVE CV LAB;  Service: Cardiovascular;  Laterality: N/A;   WISDOM TOOTH EXTRACTION      Allergies  Allergen Reactions   Nsaids Other (See Comments)    Contraindication due to ckd    Zestril [Lisinopril] Swelling   Risperdal  [Risperidone ] Other (See Comments)    Unknown reaction     Prior to Admission medications   Medication Sig Start Date End Date Taking? Authorizing Provider  divalproex  (DEPAKOTE  ER) 500 MG 24 hr tablet Take 1 tablet (500 mg total) by mouth daily. 04/04/23  Yes Joshua Domino, DO  hydrALAZINE  (APRESOLINE ) 100 MG tablet Take 1 tablet (100 mg total) by mouth 3 (three) times daily. 04/04/23  Yes Joshua Domino, DO  losartan  (COZAAR ) 100 MG tablet Take 100 mg by mouth daily. 11/11/23  Yes [provider]  NIFEdipine  (ADALAT  CC)  90 MG 24 hr tablet Take 90 mg by mouth in the morning and at bedtime.   Yes [provider]  pantoprazole  (PROTONIX ) 40 MG tablet Take 1 tablet (40 mg total) by mouth daily. 12/28/23  Yes Jenna Maude BRAVO, NP  sevelamer  carbonate (RENVELA ) 800 MG tablet Take 2 tablets (1,600 mg total) by mouth 3 (three) times daily with meals. 05/14/23  Yes Cleotilde Lukes, DO  carvedilol  (COREG ) 25 MG tablet Take 1 tablet (25 mg total) by mouth 2 (two) times daily with a meal. Patient not taking: Reported on 01/21/2024 12/28/23   Jenna Maude BRAVO, NP  doxazosin  (CARDURA ) 8 MG tablet Take 1 tablet by mouth daily. Patient not taking: Reported on 01/21/2024 12/04/23   [provider]  gabapentin  (NEURONTIN ) 100 MG capsule Please take 100 mg after dialysis on Tuesday, Thursday and Saturday Patient not taking: Reported on 01/21/2024 05/14/23   Cleotilde Lukes, DO  hydrOXYzine  (ATARAX ) 10 MG tablet Take 0.5 tablets (5 mg total) by mouth 2 (two) times daily as needed for itching. Patient not taking: Reported on 01/21/2024 07/21/23   Joshua Domino, DO    Social History   Socioeconomic History   Marital status: Married    Spouse name: Not on file   Number of children:  3   Years of education: Not on file   Highest education level: Not on file  Occupational History   Occupation: HVAC  Tobacco Use   Smoking status: Every Day    Current packs/day: 0.00    Types: Cigarettes    Last attempt to quit: 02/27/2016    Years since quitting: 7.9   Smokeless tobacco: Never   Tobacco comments:    Returned to smoking 2 black and milds per day  Vaping Use   Vaping status: Never Used  Substance and Sexual Activity   Alcohol use: Not Currently    Comment: rare   Drug use: Yes    Types: Marijuana   Sexual activity: Not Currently  Other Topics Concern   Not on file  Social History Narrative   Not on file   Social Drivers of Health   Financial Resource Strain: Not on file  Food Insecurity: No Food Insecurity  (01/21/2024)   Hunger Vital Sign    Worried About Running Out of Food in the Last Year: Never true    Ran Out of Food in the Last Year: Never true  Transportation Needs: No Transportation Needs (01/21/2024)   PRAPARE - Administrator, Civil Service (Medical): No    Lack of Transportation (Non-Medical): No  Physical Activity: Inactive (06/02/2023)   Exercise Vital Sign    Days of Exercise per Week: 0 days    Minutes of Exercise per Session: 0 min  Stress: Stress Concern Present (06/02/2023)   [employer] of Occupational Health - Occupational Stress Questionnaire    Feeling of Stress : Rather much  Social Connections: Unknown (09/20/2021)   [facility]   Social Network    Social Network: Not on file  Intimate Partner Violence: Not At Risk (01/21/2024)   Humiliation, Afraid, Rape, and Kick questionnaire    Fear of Current or Ex-Partner: No    Emotionally Abused: No    Physically Abused: No    Sexually Abused: No     Family History  Problem Relation Age of Onset   Hypertension Mother    Kidney failure Mother    Diabetes Mother    Hearing loss Father    Hypertension Sister    Heart disease Maternal Grandmother    Stroke Maternal Grandfather    Asthma Son     ROS: Pulling clots from left arm AV graft   Physical Examination  Vitals:   01/26/24 1255 01/26/24 1433  BP: (!) 178/124 (!) 156/87  Pulse:    Resp:  14  Temp:    SpO2:     Body mass index is 26.78 kg/m.  Awake alert and oriented Nonlabored respirations Left upper extremity is well-perfused there is a fluid collection near the antecubitum that is soft no overlying ecchymosis no erythema and no drainage Strong thrill left upper arm AV graft  CBC    Component Value Date/Time   WBC 4.6 01/26/2024 0506   RBC 2.82 (L) 01/26/2024 0506   HGB 7.9 (L) 01/26/2024 0506   HGB 10.4 (L) 03/17/2022 1721   HCT 24.9 (L) 01/26/2024 0506   HCT 30.2 (L) 03/17/2022 1721   PLT 156 01/26/2024  0506   PLT 207 03/17/2022 1721   MCV 88.3 01/26/2024 0506   MCV 87 03/17/2022 1721   MCH 28.0 01/26/2024 0506   MCHC 31.7 01/26/2024 0506   RDW 17.0 (H) 01/26/2024 0506   RDW 14.0 03/17/2022 1721   LYMPHSABS 1.2 01/20/2024 2100   MONOABS  0.6 01/20/2024 2100   EOSABS 0.2 01/20/2024 2100   BASOSABS 0.0 01/20/2024 2100    BMET    Component Value Date/Time   NA 129 (L) 01/26/2024 0506   NA 139 06/16/2022 1606   K 5.0 01/26/2024 0506   CL 91 (L) 01/26/2024 0506   CO2 24 01/26/2024 0506   GLUCOSE 85 01/26/2024 0506   BUN 47 (H) 01/26/2024 0506   BUN 72 (H) 06/16/2022 1606   CREATININE 10.55 (H) 01/26/2024 0506   CALCIUM  8.9 01/26/2024 0506   GFRNONAA 6 (L) 01/26/2024 0506   GFRAA 6 (L) 10/26/2019 1032    COAGS: Lab Results  Component Value Date   INR 1.2 03/27/2023     Non-Invasive Vascular Imaging:   Findings:  +--------------------+----------+-----------------+--------+  AVF                PSV (cm/s)Flow Vol (mL/min)Comments  +--------------------+----------+-----------------+--------+  Native artery inflow   157           75                  +--------------------+----------+-----------------+--------+        +--------------------+----------+-----------------+--------+  AVG                PSV (cm/s)Flow Vol (mL/min)Describe  +--------------------+----------+-----------------+--------+  Native artery inflow   157           245                 +--------------------+----------+-----------------+--------+  Arterial anastomosis   402                               +--------------------+----------+-----------------+--------+  Prox graft             300                               +--------------------+----------+-----------------+--------+  Mid graft              307                               +--------------------+----------+-----------------+--------+  Distal graft           512                                +--------------------+----------+-----------------+--------+  Venous anastomosis     510                               +--------------------+----------+-----------------+--------+  Venous outflow          66                               +--------------------+----------+-----------------+--------+  Complex structure note in the medial middle to distal upper arm measuring  5.4 x 2.7 x 3.6 cm.     Summary:  Patent arteriovenous graft.   Complex structure noted in left upper arm.     ASSESSMENT/PLAN: This is a 38 y.o. male on dialysis mostly via tunneled dialysis catheter as ordered appears by duplex and physical exam to be a left upper arm AV graft although her most recent operative notes from [facility] suggest  that he had a fistula of the thrombosed segment after placement.  The graft is patent although with low flows by recent duplex.  Swelling of the antecubitum appears to be hematoma both by physical exam and duplex likely from recent infiltration event.  Given the malfunction of the graft we will plan for shuntogram tomorrow in the Cath Lab.  He will be n.p.o. past midnight.  Sisto Granillo C. Sheree, MD [facility] Office: [phone] Pager: [phone]

## 2024-01-26 NOTE — Progress Notes (Signed)
 PROGRESS NOTE    [name]  FMW:[id number] DOB: February 07, 1986 DOA: 01/20/2024 PCP: Lonnie Earnest, MD   Brief Narrative:  38 year old man with PMHx significant for HTN (difficult to control), HFpEF (Echo 12/2023 with EF 55-60%, severe concentric LVH, G2DD, small to moderate pericardial effusion), Alport syndrome, ESRD on HD (MWFS via RIJ TDC), GERD, anemia of chronic disease, bipolar 1 disorder, hearing loss, depression, tobacco use. Recent admission 8/16 - 8/18 for malaise/body aches/GI symptoms/dizziness, presented to [facility] ED 9/10 for weakness, chills, body aches, fatigue, facial numbness x 2 weeks. Last HD 9/8. Patient states that he recently had COVID a few weeks ago and was recovering from that when he began to feel poorly again. Was tested for COVID again at HD and reportedly negative. Has had rigor-like shaking chills during HD and feels like he cannot get warm (notes that his house thermostat is set at 84 degrees post-treatment). He reports fever/chills, no CP/SOB, found to be COVID+ with hypertensive urgency.   En route with EMS, HR 86, BP 233/139, RR 86, SpO2 99% on RA. On ED arrival, patient was afebrile with HR 90s, BP 225/128, RR 21, SpO2 100%. Labs were notable for WBC 7.1, Hgb 8.3, Plt 255. Na 135, K 4.6, CO2 22, BUN/Cr 54/16.10. AST/ALT WNL, Alk Phos 139, Tbili 0.9. Trop 24. BNP 1736.2. CT Head NAICA. Labetalol  10mg  IV x 1 given without improvement in BP. Cardene  gtt was initiated.  Admitted under PCCM, eventually Cardene  stopped and he was transferred under [facility] on 01/23/2024 for  Assessment & Plan:   Principal Problem:   Hypertensive crisis  Hypertensive crisis History of HTN, refractory/difficult to control c/b poor compliance/admitted with hypertensive crisis, POA: aldosterone-renin panel pending.  Initially needed Cardene  which has been discontinued.  When patient was transitioned under [facility], patient was on home dose of nifedipine  90 mg XL, hydralazine  100 mg 3 times daily,  labetalol  200 mg twice daily, losartan  100 mg daily and yet blood pressure was significantly uncontrolled with systolic in 200 range and diastolic in 100 range.  Transitioned his nifedipine  to amlodipine  and started him on Imdur  30 mg on 01/23/2023.  Blood pressure still uncontrolled, increased Imdur  to 60 mg.  His blood pressure was still elevated on the night of 01/23/2024, night hospitalist spoke to intensivist, per his recommendations, Imdur  was increased to maximum dose of 120 mg and patient was started on clonidine  0.3 mg 3 times daily.  Patient's blood pressure was finally somewhat controlled however patient declined taking labetalol  stating that labetalol  caused him to fall asleep and he becomes lethargic and does not feel well.  Someone discontinued this medication on the night of 01/24/2024.  I personally tried to talk to him today to resume labetalol  but he said  I would absolutely not take that.  Per nephrology, he had tolerated low-dose of Coreg  before.  They talked to him started him on Coreg  12.5 mg p.o. twice daily.  Patient's blood pressure is better than before.  At this point in time, patient is maxed out on all the antihypertensive medications.  Nephrology knows him very well and per them, blood pressure 180/105 or less will be okay for him to go home.  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  Patient was scheduled to have this done outpatient last Thursday.   HFpEF / BLE edema Echo 12/2023 with EF 55-60%, severe concentric LVH, G2DD, small to moderate pericardial effusion. Optimize electrolytes for K > 4, Mg > 2.  Volume removal  by HD   ESRD on HD (MWFS via RIJ TDC) Alport syndrome LUE AVG dysfunction Followed at [facility] for transplant evaluation. He tolerated 1hr of HD on 9/11 due to report of rigors and dizziness. No objective fever. Blood cultures were sent off.  - nephrology tried hypoallergenic dialyzer, no issues with that.  Cultures are negative and he is  afebrile again.  Nephrology continues to use hypoallergenic dialyzer with no problem.  Per nephrology, they would like to keep him here and give him another dialysis on Wednesday before considering any discharge.  Hyperkalemia: Resolved, nephrology managing.   Diarrhea, acute-on-chronic Increased frequency of diarrhea, per patient.  Verified by nurses as well.  This is chronic, has been going on since about 3 weeks now.  Doubt infectious etiology since he is afebrile with no leukocytosis and no abdominal tenderness.  Continue Imodium .  Diarrhea improving.   Anemia of chronic disease - Trend H&H - Monitor for signs of active bleeding - Transfuse for Hgb < 7.0 or hemodynamically significant bleeding.  Hemoglobin 7.5 today.   GERD - PPI   Bipolar 1 disorder Depression - Continue home Depakote    Tobacco use - Encourage cessation - Nicotine  patch.  I have discussed tobacco cessation with the patient.  I have counseled the patient regarding the negative impacts of continued tobacco use including but not limited to lung cancer, COPD, and cardiovascular disease.  I have discussed alternatives to tobacco and modalities that may help facilitate tobacco cessation including but not limited to biofeedback, hypnosis, and medications.  Total time spent with tobacco counseling was 5 minutes.   DVT prophylaxis: heparin  injection 5,000 Units Start: 01/21/24 0600 SCDs Start: 01/20/24 2230   Code Status: Full Code  Family Communication:  None present at bedside.  Plan of care discussed with patient in length and he/she verbalized understanding and agreed with it.  Status is: Inpatient Remains inpatient appropriate because: Hypertensive crisis    Estimated body mass index is 26.78 kg/m as calculated from the following:   Height as of this encounter: 5' 8 (1.727 m).   Weight as of this encounter: 79.9 kg.    Nutritional Assessment: Body mass index is 26.78 kg/m.SABRA Seen by dietician.  I agree with  the assessment and plan as outlined below: Nutrition Status:        . Skin Assessment: I have examined the patient's skin and I agree with the wound assessment as performed by the wound care RN as outlined below:    Consultants:  Nephrology  Procedures:  none  Antimicrobials:  Anti-infectives (From admission, onward)    None         Subjective: Patient seen and examined.  He has no complaints today.  Objective: Vitals:   01/25/24 2312 01/26/24 0427 01/26/24 0622 01/26/24 0902  BP: (!) 176/115 (!) 182/118  (!) 179/117  Pulse: 76 68 91 77  Resp: (!) 24 12 17 16   Temp: 97.9 F (36.6 C) 98 F (36.7 C)  98 F (36.7 C)  TempSrc: Oral Oral  Oral  SpO2: 99% 98% 100% 100%  Weight:   79.9 kg   Height:        Intake/Output Summary (Last 24 hours) at 01/26/2024 1047 Last data filed at 01/26/2024 0924 Gross per 24 hour  Intake 1318 ml  Output 3000 ml  Net -1682 ml    Filed Weights   01/25/24 0819 01/25/24 1211 01/26/24 0622  Weight: 85.6 kg 79 kg 79.9 kg    Examination: General exam:  Appears calm and comfortable  Respiratory system: Clear to auscultation. Respiratory effort normal. Cardiovascular system: S1 & S2 heard, RRR. No JVD, murmurs, rubs, gallops or clicks. No pedal edema. Gastrointestinal system: Abdomen is nondistended, soft and nontender. No organomegaly or masses felt. Normal bowel sounds heard. Central nervous system: Alert and oriented. No focal neurological deficits. Extremities: Symmetric 5 x 5 power. Skin: No rashes, lesions or ulcers.  Psychiatry: Judgement and insight appear poor  Data Reviewed: I have personally reviewed following labs and imaging studies  CBC: Recent Labs  Lab 01/20/24 2100 01/21/24 0110 01/22/24 1022 01/23/24 0230 01/24/24 0332 01/25/24 0327 01/26/24 0506  WBC 7.1   < > 10.3 5.6 5.6 4.8 4.6  NEUTROABS 5.1  --   --   --   --   --   --   HGB 8.3*   < > 8.8* 8.0* 7.7* 7.5* 7.9*  HCT 26.4*   < > 27.8* 25.7* 24.6*  24.2* 24.9*  MCV 89.8   < > 90.0 89.9 89.8 90.3 88.3  PLT 255   < > 195 179 206 192 156   < > = values in this interval not displayed.   Basic Metabolic Panel: Recent Labs  Lab 01/21/24 0110 01/22/24 0757 01/23/24 0230 01/24/24 0332 01/25/24 0327 01/26/24 0506  NA 135 137 137 134* 131* 129*  K 4.1 4.8 4.4 5.1 5.5* 5.0  CL 95* 99 98 98 95* 91*  CO2 21* 15* 26 21* 20* 24  GLUCOSE 142* 104* 91 78 84 85  BUN 54* 54* 36* 51* 65* 47*  CREATININE 16.42* 15.46* 10.28* 12.39* 14.60* 10.55*  CALCIUM  9.4 8.9 8.6* 8.9 8.8* 8.9  MG 2.3 2.1  --   --   --   --   PHOS 4.5 5.0*  --   --   --   --    GFR: Estimated Creatinine Clearance: 9.4 mL/min (A) (by C-G formula based on SCr of 10.55 mg/dL (H)). Liver Function Tests: Recent Labs  Lab 01/20/24 2100 01/22/24 0757  AST 26  --   ALT 13  --   ALKPHOS 139*  --   BILITOT 0.9  --   PROT 6.7  --   ALBUMIN  2.8* 2.9*   No results for input(s): LIPASE, AMYLASE in the last 168 hours. No results for input(s): AMMONIA in the last 168 hours. Coagulation Profile: No results for input(s): INR, PROTIME in the last 168 hours. Cardiac Enzymes: No results for input(s): CKTOTAL, CKMB, CKMBINDEX, TROPONINI in the last 168 hours. BNP (last 3 results) No results for input(s): PROBNP in the last 8760 hours. HbA1C: No results for input(s): HGBA1C in the last 72 hours.  CBG: Recent Labs  Lab 01/21/24 0020 01/24/24 1903  GLUCAP 103* 123*   Lipid Profile: No results for input(s): CHOL, HDL, LDLCALC, TRIG, CHOLHDL, LDLDIRECT in the last 72 hours. Thyroid Function Tests: No results for input(s): TSH, T4TOTAL, FREET4, T3FREE, THYROIDAB in the last 72 hours. Anemia Panel: No results for input(s): VITAMINB12, FOLATE, FERRITIN, TIBC, IRON , RETICCTPCT in the last 72 hours. Sepsis Labs: No results for input(s): PROCALCITON, LATICACIDVEN in the last 168 hours.  Recent Results (from the past 240  hours)  MRSA Next Gen by PCR, Nasal     Status: None   Collection Time: 01/21/24 12:16 AM   Specimen: Nasal Mucosa; Nasal Swab  Result Value Ref Range Status   MRSA by PCR Next Gen NOT DETECTED NOT DETECTED Final    Comment: (NOTE) The GeneXpert MRSA  Assay (FDA approved for NASAL specimens only), is one component of a comprehensive MRSA colonization surveillance program. It is not intended to diagnose MRSA infection nor to guide or monitor treatment for MRSA infections. Test performance is not FDA approved in patients less than 85 years old. Performed at [facility], [address], [place], [place] [address]   Culture, blood (Routine X 2) w Reflex to ID Panel     Status: None   Collection Time: 01/21/24 10:35 AM   Specimen: BLOOD  Result Value Ref Range Status   Specimen Description BLOOD LEFT ANTECUBITAL  Final   Special Requests   Final    BOTTLES DRAWN AEROBIC AND ANAEROBIC Blood Culture adequate volume   Culture   Final    NO GROWTH 5 DAYS Performed at [facility], [address], [place], [place] [address]    Report Status 01/26/2024 FINAL  Final  Culture, blood (Routine X 2) w Reflex to ID Panel     Status: None   Collection Time: 01/21/24 10:35 AM   Specimen: BLOOD  Result Value Ref Range Status   Specimen Description BLOOD BLOOD RIGHT HAND  Final   Special Requests   Final    BOTTLES DRAWN AEROBIC AND ANAEROBIC Blood Culture adequate volume   Culture   Final    NO GROWTH 5 DAYS Performed at [facility], [address] [place], [place] [address]    Report Status 01/26/2024 FINAL  Final     Radiology Studies: No results found.   Scheduled Meds:  amLODipine   10 mg Oral Daily   carvedilol   12.5 mg Oral BID   Chlorhexidine  Gluconate Cloth  6 each Topical Q0600   cloNIDine   0.3 mg Oral TID   darbepoetin (ARANESP ) injection - DIALYSIS  150 mcg Subcutaneous Q Mon-1800   divalproex   500 mg Oral Daily   heparin   5,000 Units Subcutaneous Q8H    hydrALAZINE   100 mg Oral TID   isosorbide  mononitrate  120 mg Oral Daily   losartan   100 mg Oral QPM   nicotine   14 mg Transdermal Daily   pantoprazole   40 mg Oral BID   sevelamer  carbonate  1,600 mg Oral TID WC   spironolactone   25 mg Oral Daily   Continuous Infusions:   LOS: 6 days   Fredia Skeeter, MD [facility]  01/26/2024, 10:47 AM   *Please note that this is a verbal dictation therefore any spelling or grammatical errors are due to the Dragon Medical One system interpretation.  Please page via Amion and do not message via secure chat for urgent patient care matters. Secure chat can be used for non urgent patient care matters.  How to contact the [facility] Attending or Consulting provider 7A - 7P or covering provider during after hours 7P -7A, for this patient?  Check the care team in [facility] and look for a) attending/consulting [facility] provider listed and b) the [facility] team listed. Page or secure chat 7A-7P. Log into [email or web address] and use [place]'s universal password to access. If you do not have the password, please contact the hospital operator. Locate the [facility] provider you are looking for under [facility] and page to a number that you can be directly reached. If you still have difficulty reaching the provider, please page the [facility] (Director on Call) for the Hospitalists listed on amion for assistance.

## 2024-01-26 NOTE — Progress Notes (Signed)
 Pt complains of chest pain 10/10 and shortness of breath. States he feels like something is sitting on his chest. BP 179/129, MAP 143. Oxygen 97% on RA. HR 69. EKG done, placed in chart. Clonidine , hydralazine , and losartan  given. MD notified.  [name] L Farron Lafond, RN

## 2024-01-27 ENCOUNTER — Encounter (HOSPITAL_COMMUNITY): Payer: Self-pay | Admitting: Vascular Surgery

## 2024-01-27 ENCOUNTER — Other Ambulatory Visit: Payer: Self-pay

## 2024-01-27 ENCOUNTER — Encounter (HOSPITAL_COMMUNITY)
Admission: EM | Disposition: A | Discharge status: Home / Self Care | Payer: Self-pay | Source: Home / Self Care | Attending: Internal Medicine

## 2024-01-27 DIAGNOSIS — Z992 Dependence on renal dialysis: Secondary | ICD-10-CM | POA: Diagnosis not present

## 2024-01-27 DIAGNOSIS — T82838A Hemorrhage of vascular prosthetic devices, implants and grafts, initial encounter: Secondary | ICD-10-CM | POA: Diagnosis not present

## 2024-01-27 DIAGNOSIS — N186 End stage renal disease: Secondary | ICD-10-CM | POA: Diagnosis not present

## 2024-01-27 DIAGNOSIS — I169 Hypertensive crisis, unspecified: Secondary | ICD-10-CM | POA: Diagnosis not present

## 2024-01-27 HISTORY — PX: A/V FISTULAGRAM: CATH118298

## 2024-01-27 LAB — CBC
HCT: 25.2 % — ABNORMAL LOW (ref 39.0–52.0)
Hemoglobin: 8.1 g/dL — ABNORMAL LOW (ref 13.0–17.0)
MCH: 28.4 pg (ref 26.0–34.0)
MCHC: 32.1 g/dL (ref 30.0–36.0)
MCV: 88.4 fL (ref 80.0–100.0)
Platelets: 168 K/uL (ref 150–400)
RBC: 2.85 MIL/uL — ABNORMAL LOW (ref 4.22–5.81)
RDW: 17 % — ABNORMAL HIGH (ref 11.5–15.5)
WBC: 5.3 K/uL (ref 4.0–10.5)
nRBC: 0 % (ref 0.0–0.2)

## 2024-01-27 LAB — BASIC METABOLIC PANEL WITH GFR
Anion gap: 13 (ref 5–15)
BUN: 63 mg/dL — ABNORMAL HIGH (ref 6–20)
CO2: 22 mmol/L (ref 22–32)
Calcium: 8.9 mg/dL (ref 8.9–10.3)
Chloride: 92 mmol/L — ABNORMAL LOW (ref 98–111)
Creatinine, Ser: 13.03 mg/dL — ABNORMAL HIGH (ref 0.61–1.24)
GFR, Estimated: 5 mL/min — ABNORMAL LOW (ref >60)
Glucose, Bld: 87 mg/dL (ref 70–99)
Potassium: 5.5 mmol/L — ABNORMAL HIGH (ref 3.5–5.1)
Sodium: 127 mmol/L — ABNORMAL LOW (ref 135–145)

## 2024-01-27 LAB — TROPONIN I (HIGH SENSITIVITY): Troponin I (High Sensitivity): 34 ng/L — ABNORMAL HIGH (ref <18)

## 2024-01-27 SURGERY — A/V FISTULAGRAM
Anesthesia: LOCAL | Laterality: Left

## 2024-01-27 MED ORDER — LIDOCAINE HCL (PF) 1 % IJ SOLN
INTRAMUSCULAR | Status: DC | PRN
  Administered 2024-01-27 (×2): 5 mL

## 2024-01-27 MED ORDER — CARVEDILOL 12.5 MG PO TABS
12.5000 mg | ORAL_TABLET | Freq: Two times a day (BID) | ORAL | Status: DC
  Administered 2024-01-27 – 2024-02-03 (×14): 12.5 mg via ORAL
  Filled 2024-01-27 (×15): qty 1

## 2024-01-27 MED ORDER — IODIXANOL 320 MG/ML IV SOLN
INTRAVENOUS | Status: DC | PRN
  Administered 2024-01-27: 25 mL

## 2024-01-27 MED ORDER — HEPARIN SODIUM (PORCINE) 1000 UNIT/ML IJ SOLN
3200.0000 [IU] | Freq: Once | INTRAMUSCULAR | Status: AC
  Administered 2024-01-27: 3200 [IU]

## 2024-01-27 MED ORDER — SODIUM ZIRCONIUM CYCLOSILICATE 10 G PO PACK
10.0000 g | PACK | ORAL | Status: DC
  Administered 2024-01-27 – 2024-02-10 (×4): 10 g via ORAL
  Filled 2024-01-27 (×7): qty 1

## 2024-01-27 MED ORDER — LIDOCAINE HCL (PF) 1 % IJ SOLN
INTRAMUSCULAR | Status: AC
  Filled 2024-01-27: qty 30

## 2024-01-27 MED ORDER — HEPARIN (PORCINE) IN NACL 1000-0.9 UT/500ML-% IV SOLN
INTRAVENOUS | Status: DC | PRN
  Administered 2024-01-27: 500 mL

## 2024-01-27 MED ORDER — SPIRONOLACTONE 25 MG PO TABS
25.0000 mg | ORAL_TABLET | Freq: Every day | ORAL | Status: DC
  Administered 2024-01-27 – 2024-01-31 (×5): 25 mg via ORAL
  Filled 2024-01-27 (×6): qty 1

## 2024-01-27 MED ORDER — HYDROMORPHONE HCL 1 MG/ML IJ SOLN
0.5000 mg | INTRAMUSCULAR | Status: AC | PRN
  Administered 2024-01-27: 0.5 mg via INTRAVENOUS
  Administered 2024-01-28 – 2024-01-29 (×2): 1 mg via INTRAVENOUS
  Filled 2024-01-27: qty 1
  Filled 2024-01-27: qty 0.5
  Filled 2024-01-27: qty 1

## 2024-01-27 MED ORDER — ISOSORBIDE MONONITRATE ER 60 MG PO TB24
120.0000 mg | ORAL_TABLET | Freq: Every day | ORAL | Status: DC
  Administered 2024-01-27 – 2024-02-04 (×9): 120 mg via ORAL
  Filled 2024-01-27 (×10): qty 2

## 2024-01-27 SURGICAL SUPPLY — 5 items
KIT MICROPUNCTURE NIT STIFF (SHEATH) IMPLANT
PACK CARDIAC CATHETERIZATION (CUSTOM PROCEDURE TRAY) IMPLANT
SHEATH PROBE COVER 6X72 (BAG) ×1 IMPLANT
STOPCOCK MORSE 400PSI 3WAY (MISCELLANEOUS) ×1 IMPLANT
TUBING CIL FLEX 10 FLL-RA (TUBING) ×1 IMPLANT

## 2024-01-27 NOTE — Progress Notes (Signed)
 PT Cancellation Note  Patient Details Name: MONT JAGODA MRN: [id number] DOB: 05-08-1986   Cancelled Treatment:    Reason Eval/Treat Not Completed: (P) Patient at procedure or test/unavailable (Pt at Cath Lab.) Will continue efforts per PT plan of care as schedule permits.   Connell HERO Shariyah Eland 01/27/2024, 8:58 AM

## 2024-01-27 NOTE — Progress Notes (Addendum)
 Received patient in bed.Alert and oriented x 4.Consent verified.  Access used: Right hd catheter that worked well.Dressing on date.  Duration of treatment: 3.5 hours.  Uf goal: Met 5 liters,tolerated treatment.  Hand off to the patient's nurse,back into his room with stable condition via transporter.Reminded nurse that Lokelma  was not given yet until post trewatment.

## 2024-01-27 NOTE — Progress Notes (Signed)
 PROGRESS NOTE    [name]  FMW:[id number] DOB: 18-Jul-1985 DOA: 01/20/2024 PCP: Lonnie Earnest, MD   Brief Narrative:  38 year old man with PMHx significant for HTN (difficult to control), HFpEF (Echo 12/2023 with EF 55-60%, severe concentric LVH, G2DD, small to moderate pericardial effusion), Alport syndrome, ESRD on HD (MWFS via RIJ TDC), GERD, anemia of chronic disease, bipolar 1 disorder, hearing loss, depression, tobacco use. Recent admission 8/16 - 8/18 for malaise/body aches/GI symptoms/dizziness, presented to [facility] ED 9/10 for weakness, chills, body aches, fatigue, facial numbness x 2 weeks. Last HD 9/8. Patient states that he recently had COVID a few weeks ago and was recovering from that when he began to feel poorly again. Was tested for COVID again at HD and reportedly negative. Has had rigor-like shaking chills during HD and feels like he cannot get warm (notes that his house thermostat is set at 84 degrees post-treatment). He reports fever/chills, no CP/SOB, found to be COVID+ with hypertensive urgency.   En route with EMS, HR 86, BP 233/139, RR 86, SpO2 99% on RA. On ED arrival, patient was afebrile with HR 90s, BP 225/128, RR 21, SpO2 100%. Labs were notable for WBC 7.1, Hgb 8.3, Plt 255. Na 135, K 4.6, CO2 22, BUN/Cr 54/16.10. AST/ALT WNL, Alk Phos 139, Tbili 0.9. Trop 24. BNP 1736.2. CT Head NAICA. Labetalol  10mg  IV x 1 given without improvement in BP. Cardene  gtt was initiated.  Admitted under PCCM, eventually Cardene  stopped and he was transferred under TRH on 01/23/2024 for  Assessment & Plan:   Principal Problem:   Hypertensive crisis  Hypertensive crisis History of HTN, refractory/difficult to control c/b poor compliance/admitted with hypertensive crisis, POA: aldosterone-renin panel pending.  Initially needed Cardene  which was weaned off.  When patient was transitioned under TRH, patient was on home dose of nifedipine  90 mg XL, hydralazine  100 mg 3 times daily, labetalol   200 mg twice daily, losartan  100 mg daily and yet blood pressure was significantly uncontrolled with systolic in 200 range and diastolic in 100 range.  Transitioned his nifedipine  to amlodipine  and started him on Imdur  30 mg on 01/23/2023.  Imdur  eventually escalated to maximum dose of 120 mg due to uncontrolled blood pressure and then clonidine  0.3 mg 3 times daily was added.  Patient's blood pressure was finally somewhat controlled however patient declined taking labetalol  stating that labetalol  caused him to fall asleep and he becomes lethargic and does not feel well.  Per nephrology, he had tolerated low-dose of Coreg  before.  They talked to him started him on Coreg  12.5 mg p.o. twice daily.  At this point in time, patient is maxed out on all the antihypertensive medications.  Nephrology knows him very well and per them, blood pressure 180/105 or less will be okay for him to go home.  Blood pressure has been improved but systolic usually around 119 and diastolic around 110.   HFpEF / BLE edema Echo 12/2023 with EF 55-60%, severe concentric LVH, G2DD, small to moderate pericardial effusion. Optimize electrolytes for K > 4, Mg > 2.  Volume removal by HD   ESRD on HD (MWFS via RIJ TDC) Alport syndrome LUE AVG dysfunction Followed at [facility] for transplant evaluation. He tolerated 1hr of HD on 9/11 due to report of rigors and dizziness. No objective fever. Blood cultures were sent off.  - nephrology tried hypoallergenic dialyzer, no issues with that.  Cultures are negative and he is afebrile again.  Nephrology continues to use hypoallergenic dialyzer with no  problem.  Per nephrology, patient is to get another dialysis today.  Vascular surgery has been consulted for his fistula and he is scheduled for shuntogram today.  Hyperkalemia: Resolved, nephrology managing.   Diarrhea, acute-on-chronic Increased frequency of diarrhea, per patient.  Verified by nurses as well.  This is chronic, has been going on  since about 3 weeks now.  Doubt infectious etiology since he is afebrile with no leukocytosis and no abdominal tenderness.  Continue Imodium .  Diarrhea improving.   Anemia of chronic disease - Trend H&H - Monitor for signs of active bleeding - Transfuse for Hgb < 7.0 or hemodynamically significant bleeding.  Hemoglobin 7.5 today.   GERD - PPI   Bipolar 1 disorder Depression - Continue home Depakote    Tobacco use - Encourage cessation - Nicotine  patch.  I have discussed tobacco cessation with the patient.  I have counseled the patient regarding the negative impacts of continued tobacco use including but not limited to lung cancer, COPD, and cardiovascular disease.  I have discussed alternatives to tobacco and modalities that may help facilitate tobacco cessation including but not limited to biofeedback, hypnosis, and medications.  Total time spent with tobacco counseling was 5 minutes.  Chest pain: He complained of chest pain last evening.  EKG was done which showed sinus rhythm with LVH, no ST-T wave changes.  Troponins were checked which were slightly elevated but flat and consistent with his baseline.  ACS ruled out.  He is still complaining of chest pain this morning.  On examination, he has epigastric tenderness.  Wonder if GERD is the reason for his chest pain.  Patient is on pantoprazole  40 mg twice daily already.   DVT prophylaxis: heparin  injection 5,000 Units Start: 01/21/24 0600 SCDs Start: 01/20/24 2230   Code Status: Full Code  Family Communication:  None present at bedside.  Plan of care discussed with patient in length and he/she verbalized understanding and agreed with it.  Status is: Inpatient Remains inpatient appropriate because: Hypertensive crisis    Estimated body mass index is 27.52 kg/m as calculated from the following:   Height as of this encounter: 5' 8 (1.727 m).   Weight as of this encounter: 82.1 kg.    Nutritional Assessment: Body mass index is 27.52  kg/m.SABRA Seen by dietician.  I agree with the assessment and plan as outlined below: Nutrition Status:        . Skin Assessment: I have examined the patient's skin and I agree with the wound assessment as performed by the wound care RN as outlined below:    Consultants:  Nephrology  Procedures:  none  Antimicrobials:  Anti-infectives (From admission, onward)    None         Subjective: Patient seen and examined.  Was eating breakfast and appeared calm and comfortable.  However still complaining of chest pain, 5 out of 10.  Also says that he was having trouble breathing.  Lungs were clear to auscultation though.  Epigastric tenderness on exam.  Objective: Vitals:   01/26/24 2311 01/27/24 0531 01/27/24 0725 01/27/24 0740  BP: (!) 181/107 (!) 190/125 (!) 193/127 (!) 192/103  Pulse: 71 72 70 (!) 115  Resp: 20 19 (!) 21 (!) 21  Temp: 98.1 F (36.7 C) 98.2 F (36.8 C)  98.2 F (36.8 C)  TempSrc: Oral Oral  Oral  SpO2: 100% 100% 97% 96%  Weight:  82.1 kg    Height:        Intake/Output Summary (Last 24 hours)  at 01/27/2024 0746 Last data filed at 01/26/2024 2028 Gross per 24 hour  Intake 480 ml  Output --  Net 480 ml    Filed Weights   01/25/24 1211 01/26/24 0622 01/27/24 0531  Weight: 79 kg 79.9 kg 82.1 kg    Examination:  General exam: Appears calm and comfortable  Respiratory system: Clear to auscultation. Respiratory effort normal. Cardiovascular system: S1 & S2 heard, RRR. No JVD, murmurs, rubs, gallops or clicks. No pedal edema. Gastrointestinal system: Abdomen is nondistended, soft and epigastric tenderness. No organomegaly or masses felt. Normal bowel sounds heard. Central nervous system: Alert and oriented. No focal neurological deficits. Extremities: Symmetric 5 x 5 power. Skin: No rashes, lesions or ulcers.  Psychiatry: Judgement and insight appear poor  Data Reviewed: I have personally reviewed following labs and imaging studies  CBC: Recent  Labs  Lab 01/20/24 2100 01/21/24 0110 01/23/24 0230 01/24/24 0332 01/25/24 0327 01/26/24 0506 01/27/24 0356  WBC 7.1   < > 5.6 5.6 4.8 4.6 5.3  NEUTROABS 5.1  --   --   --   --   --   --   HGB 8.3*   < > 8.0* 7.7* 7.5* 7.9* 8.1*  HCT 26.4*   < > 25.7* 24.6* 24.2* 24.9* 25.2*  MCV 89.8   < > 89.9 89.8 90.3 88.3 88.4  PLT 255   < > 179 206 192 156 168   < > = values in this interval not displayed.   Basic Metabolic Panel: Recent Labs  Lab 01/21/24 0110 01/22/24 0757 01/23/24 0230 01/24/24 0332 01/25/24 0327 01/26/24 0506 01/27/24 0356  NA 135 137 137 134* 131* 129* 127*  K 4.1 4.8 4.4 5.1 5.5* 5.0 5.5*  CL 95* 99 98 98 95* 91* 92*  CO2 21* 15* 26 21* 20* 24 22  GLUCOSE 142* 104* 91 78 84 85 87  BUN 54* 54* 36* 51* 65* 47* 63*  CREATININE 16.42* 15.46* 10.28* 12.39* 14.60* 10.55* 13.03*  CALCIUM  9.4 8.9 8.6* 8.9 8.8* 8.9 8.9  MG 2.3 2.1  --   --   --   --   --   PHOS 4.5 5.0*  --   --   --   --   --    GFR: Estimated Creatinine Clearance: 8.2 mL/min (A) (by C-G formula based on SCr of 13.03 mg/dL (H)). Liver Function Tests: Recent Labs  Lab 01/20/24 2100 01/22/24 0757  AST 26  --   ALT 13  --   ALKPHOS 139*  --   BILITOT 0.9  --   PROT 6.7  --   ALBUMIN  2.8* 2.9*   No results for input(s): LIPASE, AMYLASE in the last 168 hours. No results for input(s): AMMONIA in the last 168 hours. Coagulation Profile: No results for input(s): INR, PROTIME in the last 168 hours. Cardiac Enzymes: No results for input(s): CKTOTAL, CKMB, CKMBINDEX, TROPONINI in the last 168 hours. BNP (last 3 results) No results for input(s): PROBNP in the last 8760 hours. HbA1C: No results for input(s): HGBA1C in the last 72 hours.  CBG: Recent Labs  Lab 01/21/24 0020 01/24/24 1903 01/26/24 1205  GLUCAP 103* 123* 74   Lipid Profile: No results for input(s): CHOL, HDL, LDLCALC, TRIG, CHOLHDL, LDLDIRECT in the last 72 hours. Thyroid Function  Tests: No results for input(s): TSH, T4TOTAL, FREET4, T3FREE, THYROIDAB in the last 72 hours. Anemia Panel: No results for input(s): VITAMINB12, FOLATE, FERRITIN, TIBC, IRON , RETICCTPCT in the last 72 hours. Sepsis Labs:  No results for input(s): PROCALCITON, LATICACIDVEN in the last 168 hours.  Recent Results (from the past 240 hours)  MRSA Next Gen by PCR, Nasal     Status: None   Collection Time: 01/21/24 12:16 AM   Specimen: Nasal Mucosa; Nasal Swab  Result Value Ref Range Status   MRSA by PCR Next Gen NOT DETECTED NOT DETECTED Final    Comment: (NOTE) The GeneXpert MRSA Assay (FDA approved for NASAL specimens only), is one component of a comprehensive MRSA colonization surveillance program. It is not intended to diagnose MRSA infection nor to guide or monitor treatment for MRSA infections. Test performance is not FDA approved in patients less than 58 years old. Performed at [facility], [address], [place], [place] [address]   Culture, blood (Routine X 2) w Reflex to ID Panel     Status: None   Collection Time: 01/21/24 10:35 AM   Specimen: BLOOD  Result Value Ref Range Status   Specimen Description BLOOD LEFT ANTECUBITAL  Final   Special Requests   Final    BOTTLES DRAWN AEROBIC AND ANAEROBIC Blood Culture adequate volume   Culture   Final    NO GROWTH 5 DAYS Performed at [facility], [address], [place], [place] [address]    Report Status 01/26/2024 FINAL  Final  Culture, blood (Routine X 2) w Reflex to ID Panel     Status: None   Collection Time: 01/21/24 10:35 AM   Specimen: BLOOD  Result Value Ref Range Status   Specimen Description BLOOD BLOOD RIGHT HAND  Final   Special Requests   Final    BOTTLES DRAWN AEROBIC AND ANAEROBIC Blood Culture adequate volume   Culture   Final    NO GROWTH 5 DAYS Performed at [facility], [address] [place], [place] [address]    Report Status 01/26/2024 FINAL  Final      Radiology Studies: DG CHEST PORT 1 VIEW Result Date: 01/26/2024 CLINICAL DATA:  141880 SOB (shortness of breath) [id number] EXAM: PORTABLE CHEST 1 VIEW COMPARISON:  Chest x-ray 12/26/2023 FINDINGS: Right chest wall dialysis catheter with tip overlying the right atrium. The heart and mediastinal contours are within normal limits. Right base atelectasis. No focal consolidation. No pulmonary edema. No pleural effusion. No pneumothorax. No acute osseous abnormality. IMPRESSION: No active disease. Electronically Signed   By: Morgane  Naveau M.D.   On: 01/26/2024 22:27     Scheduled Meds:  amLODipine   10 mg Oral Daily   carvedilol   12.5 mg Oral BID   Chlorhexidine  Gluconate Cloth  6 each Topical Q0600   cloNIDine   0.3 mg Oral TID   darbepoetin (ARANESP ) injection - DIALYSIS  150 mcg Subcutaneous Q Mon-1800   divalproex   500 mg Oral Daily   heparin   5,000 Units Subcutaneous Q8H   hydrALAZINE   100 mg Oral TID   isosorbide  mononitrate  120 mg Oral Daily   losartan   100 mg Oral QPM   nicotine   14 mg Transdermal Daily   pantoprazole   40 mg Oral BID   sevelamer  carbonate  1,600 mg Oral TID WC   spironolactone   25 mg Oral Daily   Continuous Infusions:   LOS: 7 days   Fredia Skeeter, MD [facility]  01/27/2024, 7:46 AM   *Please note that this is a verbal dictation therefore any spelling or grammatical errors are due to the Dragon Medical One system interpretation.  Please page via Amion and do not message  via secure chat for urgent patient care matters. Secure chat can be used for non urgent patient care matters.  How to contact the [facility] Attending or Consulting provider 7A - 7P or covering provider during after hours 7P -7A, for this patient?  Check the care team in [facility] and look for a) attending/consulting TR[facility] provider listed and b) the [facility] team listed. Page or secure chat 7A-7P. Log into [email or web address] and use [place]'s universal password to access. If you do not have the password,  please contact the hospital operator. Locate the [facility] provider you are looking for under [facility] and page to a number that you can be directly reached. If you still have difficulty reaching the provider, please page the [facility] (Director on Call) for the Hospitalists listed on amion for assistance.

## 2024-01-27 NOTE — Op Note (Signed)
    Patient name: [name] MRN: [id number] DOB: Feb 08, 1986 Sex: male  01/27/2024 Pre-operative Diagnosis: Fistula malfunction Post-operative diagnosis:  Same Surgeon:  Fonda FORBES Rim, MD Procedure Performed: 1.  Ultrasound-guided micropuncture access of the left arm AV graft 2.  Fistulogram  Indications: Patient is a 38 year old male with history of multiple access procedures for end-stage renal disease requiring dialysis.  He is currently being dialyzed through a left arm AV graft.  They have had difficulty with cannulation and there is a hematoma present.  After discussing the risks and benefits of left arm fistulagram in an effort to assess flow, the patient elected to proceed.  Findings:   No flow-limiting stenosis within the left arm AV graft.  Widely patent anastomosis both proximally and distally.  No outflow stenosis.   Procedure:  The patient was identified in the holding area and taken to room 8.  The patient was then placed supine on the table and prepped and draped in the usual sterile fashion.  A time out was called.  Ultrasound was used to evaluate the left arm AV graft.  This was accessed using a micropuncture needle, and exchanged for a micropuncture sheath using Seldinger technique.  Left arm fistulogram followed.  See results above.  No intervention.  Recommend graft rest due to hematoma.  Recommend continued dialysis through the tunneled dialysis catheter.  Can follow-up with his primary surgeons at [facility].    Fonda FORBES Rim MD [facility] Office: [phone]

## 2024-01-27 NOTE — Progress Notes (Signed)
 [facility] Progress Note   Subjective:    Seen in room  No c/o's today Had fistulogram this am, showed no stenosis within the AVG or at the outflow  Objective Vitals:   01/27/24 0847 01/27/24 0852 01/27/24 0907 01/27/24 1030  BP: (!) 182/121 (!) 183/125 (!) 174/118 (!) 167/119  Pulse: 65 61  65  Resp: 18 15 16 17   Temp:      TempSrc:      SpO2: 96% 96% 100% 100%  Weight:      Height:       Physical Exam General: calm this morning, [place] O2 Heart:RRR Lungs: clear Abdomen:soft Extremities:trace edema Dialysis Access: RIJ TDC c/d/I, L AVG +t/b with swelling noted - no erythema or tenderness, no fluctuance  Dialysis Orders:  MWF NW  4h  B350   72.8kg  2K bath  Heparin  none  RIJ TDC Mircera 200 mcg q2wks - last 9/8 Calcitriol  0.75mcgmcg PO qHD - last 9/8 Sensipar 30mg  with HD - last 9/8    Assessment/Plan: HTN Emergency: current regimen -->  - amlodipine  10mg  every day - coreg  12.5 bid (lowered from 25 bid due to hx severe HA's at higher dosing) - clonidine  0.3 tid - hydralazine  100 tid -  labetalol  dc'd --> (severe side effects) - losartan  100mg  --> changed it to evening as at home - he remains 6-8kg over dry wt which is driving BP up - plan HD today w/ max UF  Rigors with dialysis: Patient c/o fevers/chills at his outpatient center after treatments lately. He's afebrile here and current WBC is stable. Bld cx here neg x 2d.  Happened again 9/11 so w used hyopallergenic Nipro dialyzer Friday and had no issues.  We didn't have any more of these in the hospital, so we used the usual (Revaclear) filter on Monday w/ a 2L saline flush Monday and he did well. He never had an anaphylactic reaction, he had rigors which were uncomfortable but not life-threatening. Will cont the 2 L NS flush. If the rigors occur again we would need to use the Nipro dialyzer going forward and the hosp and outpt HD will need to secure these.  Access: Has a R TDC and L AVG. Per OP HD RN pt  has been having pain on cannulation, difficult cannulation and sometimes pulling clots. Had fistulogram this am 9/17 --> showed no stenosis within the AVG or at the outflow tract.  ESRD -  on HD MWF. Next HD today.  Volume - sig vol overload, 8-10 kg over dry wt today after large UF monday. Cont to lower vol w/ HD.  Anemia of CKD - Hgb 9 > 8s > 7s, ESA not due yet. Last dose 9/8 in outpatient, getting darbe 150 micrograms weekly sq 1st dose 9/15.  No frank bleeding noted Secondary Hyperparathyroidism -  Ca/phos ok Nutrition - K+ levels up, switched back to renal diet  Myer Fret  MD  [facility] 01/27/2024, 12:05 PM  Recent Labs  Lab 01/20/24 2100 01/21/24 0110 01/22/24 0757 01/22/24 1022 01/26/24 0506 01/27/24 0356  HGB 8.3* 9.0*  --    < > 7.9* 8.1*  ALBUMIN  2.8*  --  2.9*  --   --   --   CALCIUM  8.9 9.4 8.9   < > 8.9 8.9  PHOS  --  4.5 5.0*  --   --   --   CREATININE 16.10* 16.42* 15.46*   < > 10.55* 13.03*  K 4.6 4.1 4.8   < >  5.0 5.5*   < > = values in this interval not displayed.    Inpatient medications:  amLODipine   10 mg Oral Daily   carvedilol   12.5 mg Oral BID   Chlorhexidine  Gluconate Cloth  6 each Topical Q0600   cloNIDine   0.3 mg Oral TID   darbepoetin (ARANESP ) injection - DIALYSIS  150 mcg Subcutaneous Q Mon-1800   divalproex   500 mg Oral Daily   heparin   5,000 Units Subcutaneous Q8H   hydrALAZINE   100 mg Oral TID   isosorbide  mononitrate  120 mg Oral Daily   losartan   100 mg Oral QPM   pantoprazole   40 mg Oral BID   sevelamer  carbonate  1,600 mg Oral TID WC   spironolactone   25 mg Oral Daily    acetaminophen , calcium  carbonate, docusate sodium , hydrALAZINE , hydrOXYzine , loperamide , metoCLOPramide  (REGLAN ) injection, nitroGLYCERIN , ondansetron , mouth rinse, polyethylene glycol

## 2024-01-27 NOTE — Progress Notes (Signed)
 OT Cancellation Note  Patient Details Name: [name] MRN: [id number] DOB: 09-Nov-1985   Cancelled Treatment:    Reason Eval/Treat Not Completed: Patient at procedure or test/ unavailable (cath lab)  [name] 01/27/2024, 8:21 AM

## 2024-01-28 ENCOUNTER — Encounter: Payer: Self-pay | Admitting: Nurse Practitioner

## 2024-01-28 DIAGNOSIS — I169 Hypertensive crisis, unspecified: Secondary | ICD-10-CM | POA: Diagnosis not present

## 2024-01-28 LAB — CBC
HCT: 26.8 % — ABNORMAL LOW (ref 39.0–52.0)
Hemoglobin: 8.5 g/dL — ABNORMAL LOW (ref 13.0–17.0)
MCH: 28.2 pg (ref 26.0–34.0)
MCHC: 31.7 g/dL (ref 30.0–36.0)
MCV: 89 fL (ref 80.0–100.0)
Platelets: 199 K/uL (ref 150–400)
RBC: 3.01 MIL/uL — ABNORMAL LOW (ref 4.22–5.81)
RDW: 17.5 % — ABNORMAL HIGH (ref 11.5–15.5)
WBC: 7.4 K/uL (ref 4.0–10.5)
nRBC: 0 % (ref 0.0–0.2)

## 2024-01-28 LAB — GLUCOSE, CAPILLARY: Glucose-Capillary: 110 mg/dL — ABNORMAL HIGH (ref 70–99)

## 2024-01-28 MED ORDER — LABETALOL HCL 5 MG/ML IV SOLN
20.0000 mg | Freq: Once | INTRAVENOUS | Status: AC
Start: 2024-01-28 — End: 2024-01-28
  Administered 2024-01-28: 20 mg via INTRAVENOUS
  Filled 2024-01-28: qty 4

## 2024-01-28 MED ORDER — CHLORHEXIDINE GLUCONATE CLOTH 2 % EX PADS
6.0000 | MEDICATED_PAD | Freq: Every day | CUTANEOUS | Status: DC
  Administered 2024-01-29: 6 via TOPICAL

## 2024-01-28 MED ORDER — HYDRALAZINE HCL 20 MG/ML IJ SOLN
20.0000 mg | INTRAMUSCULAR | Status: DC | PRN
  Administered 2024-01-28 – 2024-02-03 (×15): 20 mg via INTRAVENOUS
  Filled 2024-01-28 (×12): qty 1

## 2024-01-28 NOTE — Plan of Care (Signed)

## 2024-01-28 NOTE — Progress Notes (Addendum)
 [facility] Progress Note   Subjective:    Seen in room  BP's overnight still high, 210- 240/ 105- 120   Objective Vitals:   01/28/24 0316 01/28/24 0614 01/28/24 0728 01/28/24 1059  BP: (!) 215/108  (!) 194/111 (!) 213/105  Pulse: 80  76 70  Resp: 19 15 16    Temp: 98.3 F (36.8 C)     TempSrc: Oral     SpO2: 97%  99%   Weight:  79.3 kg    Height:  5' 8 (1.727 m)     Physical Exam General: calm this morning, [place] O2 Heart:RRR Lungs: clear Abdomen:soft Extremities:trace edema Dialysis Access: RIJ TDC c/d/I, L AVG +t/b with swelling noted - no erythema or tenderness, no fluctuance  Dialysis Orders:  MWF NW  4h  B350   72.8kg  2K bath  Heparin  none  RIJ TDC Mircera 200 mcg q2wks - last 9/8 Calcitriol  0.25mcgmcg PO qHD - last 9/8 Sensipar 30mg  with HD - last 9/8    Assessment/Plan: HTN Emergency: current regimen -->  - amlodipine  10mg  every day - coreg  12.5 bid (refusing high dose d/t severe HA's) - clonidine  0.3 tid - hydralazine  100 tid -  labetalol  dc'd (refusing d/t severe side effects) - losartan  100mg  hs - remains 6-7kg up per standing wt today, max UF Friday - very high BP's in OP setting -> goal SBP < 180/ DBP < 105  Rigors with dialysis: Patient c/o fevers/chills at his outpatient center after last 2-3 treatments. He's afebrile here, WBC wnl and negative blood cx's. Rigors happened again 9/11 so we used hyopallergenic Nipro dialyzer Friday and had no issues.  We didn't have any more of these filters in the hospital, so we used the usual (Revaclear) filter on Monday w/ a 2L saline flush and he did well. We did HD yesterday without the 2L flush and pt had rigors again that started late, just prior to end of dialysis, and cont'd for about 30 mins. I am working the HD Production designer, theatre/television/film to get Nipro dialyzer for here and at his outpatient unit.   Access: Has a R TDC and L AVG. Per OP HD RN pt has been having pain on cannulation, difficult cannulation and sometimes  pulling clots. Had fistulogram this am 9/17 --> showed no stenosis within the AVG or at the outflow tract.  ESRD -  on HD MWF. Next HD tomorrow.  Volume - sig vol excess, 7 kg over dry wt. Cont lower vol w/ HD.  Anemia of CKD - Hgb 9 > 8s > 7s, ESA not due yet. Last dose 9/8 in outpatient, getting darbe 150 micrograms weekly sq 1st dose 9/15.   Nutrition - K+ levels up, switched back to renal diet  Myer Fret  MD  [facility] 01/28/2024, 11:29 AM  Recent Labs  Lab 01/22/24 0757 01/22/24 1022 01/26/24 0506 01/27/24 0356 01/28/24 0322  HGB  --    < > 7.9* 8.1* 8.5*  ALBUMIN  2.9*  --   --   --   --   CALCIUM  8.9   < > 8.9 8.9  --   PHOS 5.0*  --   --   --   --   CREATININE 15.46*   < > 10.55* 13.03*  --   K 4.8   < > 5.0 5.5*  --    < > = values in this interval not displayed.    Inpatient medications:  amLODipine   10 mg Oral Daily   carvedilol   12.5 mg Oral BID   Chlorhexidine  Gluconate Cloth  6 each Topical Q0600   cloNIDine   0.3 mg Oral TID   darbepoetin (ARANESP ) injection - DIALYSIS  150 mcg Subcutaneous Q Mon-1800   divalproex   500 mg Oral Daily   heparin   5,000 Units Subcutaneous Q8H   hydrALAZINE   100 mg Oral TID   isosorbide  mononitrate  120 mg Oral Daily   losartan   100 mg Oral QPM   pantoprazole   40 mg Oral BID   sevelamer  carbonate  1,600 mg Oral TID WC   sodium zirconium cyclosilicate   10 g Oral Q M,W,F   spironolactone   25 mg Oral Daily    acetaminophen , calcium  carbonate, docusate sodium , hydrALAZINE , HYDROmorphone  (DILAUDID ) injection, hydrOXYzine , loperamide , metoCLOPramide  (REGLAN ) injection, nitroGLYCERIN , ondansetron , mouth rinse, polyethylene glycol

## 2024-01-28 NOTE — Progress Notes (Addendum)
 PT Cancellation Note  Patient Details Name: [name] MRN: [id number] DOB: 03-Apr-1986   Cancelled Treatment:    Reason Eval/Treat Not Completed: (P) Medical issues which prohibited therapy (PTA defer due to hypertension -BP over 200/100) Pt c/o anxiety as he's doing everything he can to keep relaxed. Pt may benefit from chaplain consult as he is concerned about not being able to see his children. Will continue efforts per PT plan of care as schedule permits once BP at a safer level. PTA reattempt at 1327 however pt sleeping deeply, per RN BP still elevated after meds given.   [name] [name] 01/28/2024, 11:02 AM

## 2024-01-28 NOTE — Progress Notes (Signed)
 Chaplain visited Pt, Pt stated that at this time no spiritual or emotional care is needed. Chaplain services remain available upon request.

## 2024-01-28 NOTE — Progress Notes (Signed)
 Patient with questions if hospital staff can get him hearing aid batteries as his are not working. I spoke with patient and explained that this is not a battery that nursing staff can get for him while at the hospital. He stated that the hospital has gotten this for him in the past. Patient was asked if there was a family member or a friend that could bring his batteries to him?   He stated no, he has no family that can do this for him   In attempts to try to obtain a hearing aid battery for the patient, This RN spoke with rehab (pt/ot)  staff and they do not provide this for patients. Materials management also called  and this is not a battery that is kept in stock at this time. Interpretive services was also called to try to obtain an amplifier for patient however this device was not available from them. Patient was updated and understood at that time. Consulting civil engineer and bedside RN aware. Breck Hollinger Jessup RN

## 2024-01-28 NOTE — Progress Notes (Signed)
 PROGRESS NOTE    [name]  FMW:[id number] DOB: [birth year] DOA: 01/20/2024 PCP: Lonnie Earnest, MD   Brief Narrative:  38 year old man with PMHx significant for HTN (difficult to control), HFpEF (Echo 12/2023 with EF 55-60%, severe concentric LVH, G2DD, small to moderate pericardial effusion), Alport syndrome, ESRD on HD (MWFS via RIJ TDC), GERD, anemia of chronic disease, bipolar 1 disorder, hearing loss, depression, tobacco use. Recent admission 8/16 - 8/18 for malaise/body aches/GI symptoms/dizziness, presented to [facility] ED 9/10 for weakness, chills, body aches, fatigue, facial numbness x 2 weeks. Last HD 9/8. Patient states that he recently had COVID a few weeks ago and was recovering from that when he began to feel poorly again. Was tested for COVID again at HD and reportedly negative. Has had rigor-like shaking chills during HD and feels like he cannot get warm (notes that his house thermostat is set at 84 degrees post-treatment). He reports fever/chills, no CP/SOB, found to be COVID+ with hypertensive urgency.   En route with EMS, HR 86, BP 233/139, RR 86, SpO2 99% on RA. On ED arrival, patient was afebrile with HR 90s, BP 225/128, RR 21, SpO2 100%. Labs were notable for WBC 7.1, Hgb 8.3, Plt 255. Na 135, K 4.6, CO2 22, BUN/Cr 54/16.10. AST/ALT WNL, Alk Phos 139, Tbili 0.9. Trop 24. BNP 1736.2. CT Head NAICA. Labetalol  10mg  IV x 1 given without improvement in BP. Cardene  gtt was initiated.  Admitted under PCCM, eventually Cardene  stopped and he was transferred under [facility] on 01/23/2024 for  Assessment & Plan:   Principal Problem:   Hypertensive crisis  Hypertensive crisis History of HTN, refractory/difficult to control c/b poor compliance/admitted with hypertensive crisis, POA: aldosterone-renin panel pending.  Initially needed Cardene  which was weaned off.  When patient was transitioned under [facility], patient was on home dose of nifedipine  90 mg XL, hydralazine  100 mg 3 times daily, labetalol   200 mg twice daily, losartan  100 mg daily and yet blood pressure was significantly uncontrolled with systolic in 200 range and diastolic in 100 range.  Transitioned his nifedipine  to amlodipine  and started him on Imdur  30 mg on 01/23/2023.  Imdur  eventually escalated to maximum dose of 120 mg due to uncontrolled blood pressure and then clonidine  0.3 mg 3 times daily was added.  Patient's blood pressure was finally somewhat controlled however patient declined taking labetalol  stating that labetalol  caused him to fall asleep and he becomes lethargic and does not feel well.  Per nephrology, he had tolerated low-dose of Coreg  before.  They talked to him started him on Coreg  12.5 mg p.o. twice daily.  At this point in time, patient is maxed out on all the antihypertensive medications.  Nephrology knows him very well and per them, blood pressure 180/105 or less will be okay for him to go home.  Blood pressure however has been significantly elevated over last 24 hours with systolic touching 250s and diastolic 110s, partly since some of his antihypertensives were held due to dialysis.  Discussed with nephrology, they will release the hold and will continue all his antihypertensives.  Still blood pressure too high to discharge home.   HFpEF / BLE edema Echo 12/2023 with EF 55-60%, severe concentric LVH, G2DD, small to moderate pericardial effusion. Optimize electrolytes for K > 4, Mg > 2.  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 He tolerated 1hr of HD on 9/11 due to report of  rigors and dizziness. No objective fever. Blood cultures were sent off.  - nephrology tried hypoallergenic dialyzer, no issues with that.  Cultures are negative and he is afebrile again.  He had rigors again yesterday, nephrology working with HD manager to get diaper dialysate for here and at his outpatient unit.  Patient was also seen by vascular  surgery, underwent shuntogram and was found to have no flow-limiting stenosis within the left arm AV graft and widely patent anastomosis between proximally and distally.  Hyperkalemia: Resolved, nephrology managing.   Diarrhea, acute-on-chronic Increased frequency of diarrhea, per patient.  Verified by nurses as well.  This is chronic, has been going on since about 3 weeks now.  Doubt infectious etiology since he is afebrile with no leukocytosis and no abdominal tenderness.  Continue Imodium .  Diarrhea improving.   Anemia of chronic disease - Trend H&H - Monitor for signs of active bleeding - Transfuse for Hgb < 7.0 or hemodynamically significant bleeding.  Hemoglobin labile, currently 8.5.   GERD - PPI   Bipolar 1 disorder Depression - Continue home Depakote    Tobacco use - Encourage cessation - Nicotine  patch.  I have discussed tobacco cessation with the patient.  I have counseled the patient regarding the negative impacts of continued tobacco use including but not limited to lung cancer, COPD, and cardiovascular disease.  I have discussed alternatives to tobacco and modalities that may help facilitate tobacco cessation including but not limited to biofeedback, hypnosis, and medications.  Total time spent with tobacco counseling was 5 minutes.  Chest pain: He complained of chest pain last evening.  EKG was done which showed sinus rhythm with LVH, no ST-T wave changes.  Troponins were checked which were slightly elevated but flat and consistent with his baseline.  ACS ruled out.  He is still complaining of chest pain this morning.  On examination, he has epigastric tenderness.  Wonder if GERD is the reason for his chest pain.  Patient is on pantoprazole  40 mg twice daily already.  DVT prophylaxis: heparin  injection 5,000 Units Start: 01/21/24 0600 SCDs Start: 01/20/24 2230   Code Status: Full Code  Family Communication:  None present at bedside.  Plan of care discussed with patient in  length and he/she verbalized understanding and agreed with it.  Status is: Inpatient Remains inpatient appropriate because: Hypertensive crisis    Estimated body mass index is 26.58 kg/m as calculated from the following:   Height as of this encounter: 5' 8 (1.727 m).   Weight as of this encounter: 79.3 kg.    Nutritional Assessment: Body mass index is 26.58 kg/m.SABRA Seen by dietician.  I agree with the assessment and plan as outlined below: Nutrition Status:        . Skin Assessment: I have examined the patient's skin and I agree with the wound assessment as performed by the wound care RN as outlined below:    Consultants:  Nephrology  Procedures:  none  Antimicrobials:  Anti-infectives (From admission, onward)    None         Subjective: Patient seen and examined, he had no chest pain or any other complaint but he was extremely concerned about significantly elevated blood pressure yesterday.  Objective: Vitals:   01/28/24 0728 01/28/24 1059 01/28/24 1133 01/28/24 1227  BP: (!) 194/111 (!) 213/105 (!) 157/107 (!) 191/92  Pulse: 76 70  66  Resp: 16  17 18   Temp:    (!) 97.5 F (36.4 C)  TempSrc:  Oral  SpO2: 99%   96%  Weight:      Height:        Intake/Output Summary (Last 24 hours) at 01/28/2024 1243 Last data filed at 01/27/2024 1823 Gross per 24 hour  Intake --  Output 5000 ml  Net -5000 ml    Filed Weights   01/27/24 1427 01/27/24 1826 01/28/24 0614  Weight: 76.5 kg 71.5 kg 79.3 kg    Examination:  General exam: Appears calm and comfortable  Respiratory system: Clear to auscultation. Respiratory effort normal. Cardiovascular system: S1 & S2 heard, RRR. No JVD, murmurs, rubs, gallops or clicks. No pedal edema. Gastrointestinal system: Abdomen is nondistended, soft and nontender. No organomegaly or masses felt. Normal bowel sounds heard. Central nervous system: Alert and oriented. No focal neurological deficits. Extremities: Symmetric 5 x  5 power. Skin: No rashes, lesions or ulcers.  Psychiatry: Judgement and insight appear poor   Data Reviewed: I have personally reviewed following labs and imaging studies  CBC: Recent Labs  Lab 01/24/24 0332 01/25/24 0327 01/26/24 0506 01/27/24 0356 01/28/24 0322  WBC 5.6 4.8 4.6 5.3 7.4  HGB 7.7* 7.5* 7.9* 8.1* 8.5*  HCT 24.6* 24.2* 24.9* 25.2* 26.8*  MCV 89.8 90.3 88.3 88.4 89.0  PLT 206 192 156 168 199   Basic Metabolic Panel: Recent Labs  Lab 01/22/24 0757 01/23/24 0230 01/24/24 0332 01/25/24 0327 01/26/24 0506 01/27/24 0356  NA 137 137 134* 131* 129* 127*  K 4.8 4.4 5.1 5.5* 5.0 5.5*  CL 99 98 98 95* 91* 92*  CO2 15* 26 21* 20* 24 22  GLUCOSE 104* 91 78 84 85 87  BUN 54* 36* 51* 65* 47* 63*  CREATININE 15.46* 10.28* 12.39* 14.60* 10.55* 13.03*  CALCIUM  8.9 8.6* 8.9 8.8* 8.9 8.9  MG 2.1  --   --   --   --   --   PHOS 5.0*  --   --   --   --   --    GFR: Estimated Creatinine Clearance: 7.6 mL/min (A) (by C-G formula based on SCr of 13.03 mg/dL (H)). Liver Function Tests: Recent Labs  Lab 01/22/24 0757  ALBUMIN  2.9*   No results for input(s): LIPASE, AMYLASE in the last 168 hours. No results for input(s): AMMONIA in the last 168 hours. Coagulation Profile: No results for input(s): INR, PROTIME in the last 168 hours. Cardiac Enzymes: No results for input(s): CKTOTAL, CKMB, CKMBINDEX, TROPONINI in the last 168 hours. BNP (last 3 results) No results for input(s): PROBNP in the last 8760 hours. HbA1C: No results for input(s): HGBA1C in the last 72 hours.  CBG: Recent Labs  Lab 01/24/24 1903 01/26/24 1205  GLUCAP 123* 74   Lipid Profile: No results for input(s): CHOL, HDL, LDLCALC, TRIG, CHOLHDL, LDLDIRECT in the last 72 hours. Thyroid Function Tests: No results for input(s): TSH, T4TOTAL, FREET4, T3FREE, THYROIDAB in the last 72 hours. Anemia Panel: No results for input(s): VITAMINB12, FOLATE,  FERRITIN, TIBC, IRON , RETICCTPCT in the last 72 hours. Sepsis Labs: No results for input(s): PROCALCITON, LATICACIDVEN in the last 168 hours.  Recent Results (from the past 240 hours)  MRSA Next Gen by PCR, Nasal     Status: None   Collection Time: 01/21/24 12:16 AM   Specimen: Nasal Mucosa; Nasal Swab  Result Value Ref Range Status   MRSA by PCR Next Gen NOT DETECTED NOT DETECTED Final    Comment: (NOTE) The GeneXpert MRSA Assay (FDA approved for NASAL specimens only), is one component  of a comprehensive MRSA colonization surveillance program. It is not intended to diagnose MRSA infection nor to guide or monitor treatment for MRSA infections. Test performance is not FDA approved in patients less than 14 years old. Performed at [facility], [address], [place], [place] [address]   Culture, blood (Routine X 2) w Reflex to ID Panel     Status: None   Collection Time: 01/21/24 10:35 AM   Specimen: BLOOD  Result Value Ref Range Status   Specimen Description BLOOD LEFT ANTECUBITAL  Final   Special Requests   Final    BOTTLES DRAWN AEROBIC AND ANAEROBIC Blood Culture adequate volume   Culture   Final    NO GROWTH 5 DAYS Performed at [facility], [address], [place], [place] [address]    Report Status 01/26/2024 FINAL  Final  Culture, blood (Routine X 2) w Reflex to ID Panel     Status: None   Collection Time: 01/21/24 10:35 AM   Specimen: BLOOD  Result Value Ref Range Status   Specimen Description BLOOD BLOOD RIGHT HAND  Final   Special Requests   Final    BOTTLES DRAWN AEROBIC AND ANAEROBIC Blood Culture adequate volume   Culture   Final    NO GROWTH 5 DAYS Performed at [facility], [address] [place], [place] [address]    Report Status 01/26/2024 FINAL  Final     Radiology Studies: PERIPHERAL VASCULAR CATHETERIZATION Result Date: 01/27/2024 Images from the original result were not included.   Patient name: [name]         MRN: [id number]        DOB: [birth year]          Sex: male  01/27/2024 Pre-operative Diagnosis: Fistula malfunction Post-operative diagnosis:  Same Surgeon:  Fonda FORBES Rim, MD Procedure Performed: 1.  Ultrasound-guided micropuncture access of the left arm AV graft 2.  Fistulogram  Indications: Patient is a 38 year old male with history of multiple access procedures for end-stage renal disease requiring dialysis.  He is currently being dialyzed through a left arm AV graft.  They have had difficulty with cannulation and there is a hematoma present.  After discussing the risks and benefits of left arm fistulagram in an effort to assess flow, the patient elected to proceed.  Findings:  No flow-limiting stenosis within the left arm AV graft.  Widely patent anastomosis both proximally and distally.  No outflow stenosis.             Procedure:  The patient was identified in the holding area and taken to room 8.  The patient was then placed supine on the table and prepped and draped in the usual sterile fashion.  A time out was called.  Ultrasound was used to evaluate the left arm AV graft.  This was accessed using a micropuncture needle, and exchanged for a micropuncture sheath using Seldinger technique.  Left arm fistulogram followed.  See results above.  No intervention.  Recommend graft rest due to hematoma.  Recommend continued dialysis through the tunneled dialysis catheter.  Can follow-up with his primary surgeons at [facility]    Fonda FORBES Rim MD [facility] Office: [phone]    DG CHEST PORT 1 VIEW Result Date: 01/26/2024 CLINICAL DATA:  [id number] SOB (shortness of breath) [id number] EXAM: PORTABLE CHEST 1 VIEW COMPARISON:  Chest x-ray 12/26/2023 FINDINGS: Right chest wall dialysis catheter with tip overlying the right  atrium. The heart and mediastinal contours are within normal limits. Right base atelectasis. No focal consolidation. No pulmonary edema. No pleural  effusion. No pneumothorax. No acute osseous abnormality. IMPRESSION: No active disease. Electronically Signed   By: Morgane  Naveau M.D.   On: 01/26/2024 22:27     Scheduled Meds:  amLODipine   10 mg Oral Daily   carvedilol   12.5 mg Oral BID   Chlorhexidine  Gluconate Cloth  6 each Topical Q0600   [START ON 01/29/2024] Chlorhexidine  Gluconate Cloth  6 each Topical Q0600   cloNIDine   0.3 mg Oral TID   darbepoetin (ARANESP ) injection - DIALYSIS  150 mcg Subcutaneous Q Mon-1800   divalproex   500 mg Oral Daily   heparin   5,000 Units Subcutaneous Q8H   hydrALAZINE   100 mg Oral TID   isosorbide  mononitrate  120 mg Oral Daily   losartan   100 mg Oral QPM   pantoprazole   40 mg Oral BID   sevelamer  carbonate  1,600 mg Oral TID WC   sodium zirconium cyclosilicate   10 g Oral Q M,W,F   spironolactone   25 mg Oral Daily   Continuous Infusions:   LOS: 8 days   Fredia Skeeter, MD [facility]  01/28/2024, 12:43 PM   *Please note that this is a verbal dictation therefore any spelling or grammatical errors are due to the Dragon Medical One system interpretation.  Please page via Amion and do not message via secure chat for urgent patient care matters. Secure chat can be used for non urgent patient care matters.  How to contact the [facility] Attending or Consulting provider 7A - 7P or covering provider during after hours 7P -7A, for this patient?  Check the care team in [facility] and look for a) attending/consulting [facility] provider listed and b) the [facility] team listed. Page or secure chat 7A-7P. Log into [email or web address] and use [facility]'s universal password to access. If you do not have the password, please contact the hospital operator. Locate the [facility] provider you are looking for under [facility] and page to a number that you can be directly reached. If you still have difficulty reaching the provider, please page the [facility] (Director on Call) for the Hospitalists listed on amion for assistance.

## 2024-01-28 NOTE — Progress Notes (Signed)
 OT Cancellation Note  Patient Details Name: [name] MRN: [id number] DOB: [birth year]   Cancelled Treatment:    Reason Eval/Treat Not Completed: Medical issues which prohibited therapy. Pt reporting feeling weak and fatigue. Pt requesting OT return tomorrow. RN reporting pt's BP still elevated and low blood sugar. Will follow up as able to.    [name] C, OT  [facility]ute [facility] Services Office [phone] Secure chat preferred   [name] Savers 01/28/2024, 3:12 PM

## 2024-01-29 DIAGNOSIS — I169 Hypertensive crisis, unspecified: Secondary | ICD-10-CM | POA: Diagnosis not present

## 2024-01-29 LAB — RENAL FUNCTION PANEL
Albumin: 3 g/dL — ABNORMAL LOW (ref 3.5–5.0)
Anion gap: 14 (ref 5–15)
BUN: 74 mg/dL — ABNORMAL HIGH (ref 6–20)
CO2: 22 mmol/L (ref 22–32)
Calcium: 9.1 mg/dL (ref 8.9–10.3)
Chloride: 92 mmol/L — ABNORMAL LOW (ref 98–111)
Creatinine, Ser: 13.44 mg/dL — ABNORMAL HIGH (ref 0.61–1.24)
GFR, Estimated: 4 mL/min — ABNORMAL LOW (ref >60)
Glucose, Bld: 82 mg/dL (ref 70–99)
Phosphorus: 5.6 mg/dL — ABNORMAL HIGH (ref 2.5–4.6)
Potassium: 6 mmol/L — ABNORMAL HIGH (ref 3.5–5.1)
Sodium: 128 mmol/L — ABNORMAL LOW (ref 135–145)

## 2024-01-29 LAB — CBC
HCT: 27.2 % — ABNORMAL LOW (ref 39.0–52.0)
Hemoglobin: 8.5 g/dL — ABNORMAL LOW (ref 13.0–17.0)
MCH: 28.1 pg (ref 26.0–34.0)
MCHC: 31.3 g/dL (ref 30.0–36.0)
MCV: 90.1 fL (ref 80.0–100.0)
Platelets: 196 K/uL (ref 150–400)
RBC: 3.02 MIL/uL — ABNORMAL LOW (ref 4.22–5.81)
RDW: 17.5 % — ABNORMAL HIGH (ref 11.5–15.5)
WBC: 8.7 K/uL (ref 4.0–10.5)
nRBC: 0.2 % (ref 0.0–0.2)

## 2024-01-29 LAB — GLUCOSE, CAPILLARY
Glucose-Capillary: 111 mg/dL — ABNORMAL HIGH (ref 70–99)
Glucose-Capillary: 109 mg/dL — ABNORMAL HIGH (ref 70–99)

## 2024-01-29 MED ORDER — CHLORHEXIDINE GLUCONATE CLOTH 2 % EX PADS
6.0000 | MEDICATED_PAD | Freq: Every day | CUTANEOUS | Status: DC
  Administered 2024-01-29 – 2024-01-31 (×3): 6 via TOPICAL

## 2024-01-29 MED ORDER — HEPARIN SODIUM (PORCINE) 1000 UNIT/ML IJ SOLN
INTRAMUSCULAR | Status: AC
  Filled 2024-01-29: qty 4

## 2024-01-29 MED ORDER — ANTICOAGULANT SODIUM CITRATE 4% (200MG/5ML) IV SOLN
5.0000 mL | Status: DC | PRN
  Filled 2024-01-29: qty 5

## 2024-01-29 MED ORDER — DOXAZOSIN MESYLATE 4 MG PO TABS
4.0000 mg | ORAL_TABLET | Freq: Every day | ORAL | Status: DC
  Administered 2024-01-29 – 2024-01-30 (×2): 4 mg via ORAL
  Filled 2024-01-29 (×3): qty 1

## 2024-01-29 MED ORDER — NEPRO/CARBSTEADY PO LIQD: 237.0000 mL | ORAL | Status: DC | PRN

## 2024-01-29 MED ORDER — LIDOCAINE-PRILOCAINE 2.5-2.5 % EX CREA
1.0000 | TOPICAL_CREAM | CUTANEOUS | Status: DC | PRN
  Filled 2024-01-29: qty 5

## 2024-01-29 MED ORDER — HYDRALAZINE HCL 20 MG/ML IJ SOLN
INTRAMUSCULAR | Status: AC
  Filled 2024-01-29: qty 1

## 2024-01-29 MED ORDER — LABETALOL HCL 5 MG/ML IV SOLN
20.0000 mg | Freq: Once | INTRAVENOUS | Status: AC
  Administered 2024-01-29: 20 mg via INTRAVENOUS
  Filled 2024-01-29: qty 4

## 2024-01-29 MED ORDER — LIDOCAINE HCL (PF) 1 % IJ SOLN
5.0000 mL | INTRAMUSCULAR | Status: DC | PRN
  Filled 2024-01-29: qty 5

## 2024-01-29 MED ORDER — HYDROMORPHONE HCL 1 MG/ML IJ SOLN
0.5000 mg | INTRAMUSCULAR | Status: AC | PRN
  Administered 2024-01-29 – 2024-02-02 (×3): 1 mg via INTRAVENOUS
  Filled 2024-01-29 (×2): qty 1

## 2024-01-29 MED ORDER — ONDANSETRON HCL 4 MG/2ML IJ SOLN
4.0000 mg | Freq: Three times a day (TID) | INTRAMUSCULAR | Status: DC | PRN
  Administered 2024-02-04 – 2024-02-10 (×7): 4 mg via INTRAVENOUS
  Filled 2024-01-29 (×7): qty 2

## 2024-01-29 MED ORDER — HYDROMORPHONE HCL 1 MG/ML IJ SOLN
INTRAMUSCULAR | Status: AC
  Filled 2024-01-29: qty 1

## 2024-01-29 MED ORDER — OXYCODONE HCL 5 MG PO TABS
5.0000 mg | ORAL_TABLET | Freq: Four times a day (QID) | ORAL | Status: DC | PRN
  Administered 2024-01-29 – 2024-01-30 (×2): 5 mg via ORAL
  Administered 2024-01-30 – 2024-02-03 (×2): 10 mg via ORAL
  Administered 2024-02-04 – 2024-02-05 (×2): 5 mg via ORAL
  Administered 2024-02-06 – 2024-02-10 (×4): 10 mg via ORAL
  Filled 2024-01-29 (×2): qty 2
  Filled 2024-01-29: qty 1
  Filled 2024-01-29: qty 2
  Filled 2024-01-29: qty 1
  Filled 2024-01-29: qty 2
  Filled 2024-01-29: qty 1
  Filled 2024-01-29 (×2): qty 2
  Filled 2024-01-29: qty 1
  Filled 2024-01-29: qty 2

## 2024-01-29 MED ORDER — ACETAMINOPHEN 325 MG PO TABS
ORAL_TABLET | ORAL | Status: AC
  Filled 2024-01-29: qty 2

## 2024-01-29 MED ORDER — HEPARIN SODIUM (PORCINE) 1000 UNIT/ML DIALYSIS
1000.0000 [IU] | INTRAMUSCULAR | Status: DC | PRN
  Administered 2024-01-29: 3200 [IU]
  Filled 2024-01-29 (×2): qty 1

## 2024-01-29 MED ORDER — ALTEPLASE 2 MG IJ SOLR
2.0000 mg | Freq: Once | INTRAMUSCULAR | Status: DC | PRN
  Filled 2024-01-29: qty 2

## 2024-01-29 MED ORDER — PENTAFLUOROPROP-TETRAFLUOROETH EX AERO: 1.0000 | INHALATION_SPRAY | CUTANEOUS | Status: DC | PRN

## 2024-01-29 MED ORDER — CLONIDINE HCL 0.1 MG PO TABS
0.3000 mg | ORAL_TABLET | Freq: Three times a day (TID) | ORAL | Status: DC
  Administered 2024-01-29 – 2024-02-04 (×18): 0.3 mg via ORAL
  Filled 2024-01-29 (×18): qty 3

## 2024-01-29 NOTE — Progress Notes (Signed)
 PROGRESS NOTE  [name]  FMW:[id number] DOB: 07-31-1985 DOA: 01/20/2024 PCP: Lonnie Earnest, MD  Consultants  Brief Narrative: 38 year old man with PMHx significant for HTN (difficult to control), HFpEF (Echo 12/2023 with EF 55-60%, severe concentric LVH, G2DD, small to moderate pericardial effusion), Alport syndrome, ESRD on HD (MWFS via RIJ TDC), GERD, anemia of chronic disease, bipolar 1 disorder, hearing loss, depression, tobacco use. Recent admission 8/16 - 8/18 for malaise/body aches/GI symptoms/dizziness, presented to [facility] ED 9/10 for weakness, chills, body aches, fatigue, facial numbness x 2 weeks. Last HD 9/8. Patient states that he recently had COVID a few weeks ago and was recovering from that when he began to feel poorly again. Was tested for COVID again at HD and reportedly negative. Has had rigor-like shaking chills during HD and feels like he cannot get warm (notes that his house thermostat is set at 84 degrees post-treatment). He reports fever/chills, no CP/SOB, found to be COVID+ with hypertensive urgency.   En route with EMS, HR 86, BP 233/139, RR 86, SpO2 99% on RA. On ED arrival, patient was afebrile with HR 90s, BP 225/128, RR 21, SpO2 100%. Labs were notable for WBC 7.1, Hgb 8.3, Plt 255. Na 135, K 4.6, CO2 22, BUN/Cr 54/16.10. AST/ALT WNL, Alk Phos 139, Tbili 0.9. Trop 24. BNP 1736.2. CT Head NAICA. Labetalol  10mg  IV x 1 given without improvement in BP. Cardene  gtt was initiated.  Admitted under PCCM, eventually Cardene  stopped and he was transferred under TRH on 01/23/2024.     Assessment & Plan: Hypertensive crisis History of HTN, refractory/difficult to control c/b poor compliance/admitted with hypertensive crisis, POA: - aldosterone-renin panel pending.  Initially needed Cardene  which was weaned off.  When patient was transitioned under TRH, patient was on home dose of nifedipine  90 mg XL, hydralazine  100 mg 3 times daily, labetalol  200 mg twice daily, losartan  100 mg  daily and yet blood pressure was significantly uncontrolled with systolic in 200 range and diastolic in 100 range.  Transitioned his nifedipine  to amlodipine  and started him on Imdur  30 mg on 01/23/2023.  Imdur  eventually escalated to maximum dose of 120 mg due to uncontrolled blood pressure and then clonidine  0.3 mg 3 times daily was added.  -  Patient's blood pressure was finally somewhat controlled however patient declined taking labetalol  stating that labetalol  caused him to fall asleep and he becomes lethargic and does not feel well.   - Per nephrology, he had tolerated low-dose of Coreg  before.  They talked to him started him on Coreg  12.5 mg p.o. twice daily.  At this point in time, patient is maxed out on all the antihypertensive medications.  Nephrology knows him very well and per them, blood pressure 180/105 or less will be okay for him to go home.   - BP has remained notably elevated past 24 hours, systolics 180s.  Greatly appreciate nephrology input.   - on review of his recent [facility] admission, doxazosin  eventually added to regimen and he was DC'ed on that.  Unclear why stopped, will restart here.     HFpEF / BLE edema Echo 12/2023 with EF 55-60%, severe concentric LVH, G2DD, small to moderate pericardial effusion. Optimize electrolytes for K > 4, Mg > 2.  Volume removal by HD  Chest pain:  - had episode of chest pain during dialysis, resolved with dilaudid .  No further pain this afternoon.  - reports different than prior chest pain, felt similar to when he had large pericardial effusion - although  reaccumulation not likely since August of this year, plan will be to repeat his Echo - EKG pending.   - with chest pain evening of 9/17 as well, ACS work-up negative at that time.     ESRD on HD (MWFS via RIJ TDC) Alport syndrome LUE AVG dysfunction Followed at [facility] for transplant evaluation. He tolerated 1hr of HD on 9/11 due to report of rigors and dizziness. No objective fever.  Blood cultures were sent off.  - nephrology tried hypoallergenic dialyzer, no issues with that.  Cultures are negative and he is afebrile again.  He had rigors again yesterday, nephrology working with HD manager to get diaper dialysate for here and at his outpatient unit.  Patient was also seen by vascular surgery, underwent shuntogram and was found to have no flow-limiting stenosis within the left arm AV graft and widely patent anastomosis between proximally and distally.   Hyperkalemia: Resolved, nephrology managing.   Diarrhea, acute-on-chronic Increased frequency of diarrhea, per patient.  Verified by nurses as well.  This is chronic, has been going on since about 3 weeks now.  Doubt infectious etiology since he is afebrile with no leukocytosis and no abdominal tenderness.  Continue Imodium .  Diarrhea improving.   Anemia of chronic disease - Trend H&H - Monitor for signs of active bleeding - Transfuse for Hgb < 7.0 or hemodynamically significant bleeding.  Hemoglobin labile, currently 8.5.   GERD - PPI   Bipolar 1 disorder Depression - Continue home Depakote    Tobacco use - Encourage cessation - Nicotine  patch.            DVT prophylaxis:  heparin  injection 5,000 Units Start: 01/21/24 0600 SCDs Start: 01/20/24 2230  Code Status:   Code Status: Full Code Level of care: Progressive Status is: Inpatient   Consults called: Nephrology   Subjective: Patient reports being worried over how high his blood pressure has gone.  Had some chest pain during dialysis, gone now.    Objective: Vitals:   01/29/24 1342 01/29/24 1353 01/29/24 1532 01/29/24 1539  BP: (!) 188/104 (!) 188/104 (!) 180/89   Pulse: 87  84   Resp: 17  20   Temp: 99.5 F (37.5 C)  98.3 F (36.8 C) (P) 98.3 F (36.8 C)  TempSrc: Oral  Oral (P) Oral  SpO2: 96%  96%   Weight:      Height:        Intake/Output Summary (Last 24 hours) at 01/29/2024 1730 Last data filed at 01/29/2024 1430 Gross per 24 hour   Intake 480 ml  Output 8200 ml  Net -7720 ml   Filed Weights   01/29/24 0850 01/29/24 0855 01/29/24 1251  Weight: 81.9 kg 81.9 kg 77.8 kg   Body mass index is 26.08 kg/m.  Gen: 38 y.o. male in no apparent distress.  Nontoxic Pulm: Non-labored breathing.  Clear to auscultation bilaterally.  CV: Regular rate and rhythm. No murmur, rub, or gallop. No JVD GI: Abdomen soft, non-tender, non-distended, with normoactive bowel sounds. No organomegaly or masses felt. Ext: Warm, no deformities Skin: No rashes, lesions noulcers Neuro: Alert and oriented. No focal neurological deficits. Psych: Calm  Judgement and insight appear normal. Mood & affect appropriate.     I have personally reviewed the following labs and images: CBC: Recent Labs  Lab 01/25/24 0327 01/26/24 0506 01/27/24 0356 01/28/24 0322 01/29/24 0339  WBC 4.8 4.6 5.3 7.4 8.7  HGB 7.5* 7.9* 8.1* 8.5* 8.5*  HCT 24.2* 24.9* 25.2* 26.8* 27.2*  MCV  90.3 88.3 88.4 89.0 90.1  PLT 192 156 168 199 196   BMP &GFR Recent Labs  Lab 01/24/24 0332 01/25/24 0327 01/26/24 0506 01/27/24 0356 01/29/24 0747  NA 134* 131* 129* 127* 128*  K 5.1 5.5* 5.0 5.5* 6.0*  CL 98 95* 91* 92* 92*  CO2 21* 20* 24 22 22   GLUCOSE 78 84 85 87 82  BUN 51* 65* 47* 63* 74*  CREATININE 12.39* 14.60* 10.55* 13.03* 13.44*  CALCIUM  8.9 8.8* 8.9 8.9 9.1  PHOS  --   --   --   --  5.6*   Estimated Creatinine Clearance: 7.4 mL/min (A) (by C-G formula based on SCr of 13.44 mg/dL (H)). Liver & Pancreas: Recent Labs  Lab 01/29/24 0747  ALBUMIN  3.0*   No results for input(s): LIPASE, AMYLASE in the last 168 hours. No results for input(s): AMMONIA in the last 168 hours. Diabetic: No results for input(s): HGBA1C in the last 72 hours. Recent Labs  Lab 01/24/24 1903 01/26/24 1205 01/28/24 1444  GLUCAP 123* 74 110*   Cardiac Enzymes: No results for input(s): CKTOTAL, CKMB, CKMBINDEX, TROPONINI in the last 168 hours. No results for  input(s): PROBNP in the last 8760 hours. Coagulation Profile: No results for input(s): INR, PROTIME in the last 168 hours. Thyroid Function Tests: No results for input(s): TSH, T4TOTAL, FREET4, T3FREE, THYROIDAB in the last 72 hours. Lipid Profile: No results for input(s): CHOL, HDL, LDLCALC, TRIG, CHOLHDL, LDLDIRECT in the last 72 hours. Anemia Panel: No results for input(s): VITAMINB12, FOLATE, FERRITIN, TIBC, IRON , RETICCTPCT in the last 72 hours. Urine analysis:    Component Value Date/Time   COLORURINE STRAW (A) 06/07/2021 1758   APPEARANCEUR CLEAR 06/07/2021 1758   LABSPEC 1.008 06/07/2021 1758   PHURINE 6.0 06/07/2021 1758   GLUCOSEU 50 (A) 06/07/2021 1758   HGBUR SMALL (A) 06/07/2021 1758   BILIRUBINUR NEGATIVE 06/07/2021 1758   KETONESUR NEGATIVE 06/07/2021 1758   PROTEINUR >=300 (A) 06/07/2021 1758   UROBILINOGEN 0.2 05/29/2013 1121   NITRITE NEGATIVE 06/07/2021 1758   LEUKOCYTESUR NEGATIVE 06/07/2021 1758   Sepsis Labs: Invalid input(s): PROCALCITONIN, LACTICIDVEN  Microbiology: Recent Results (from the past 240 hours)  MRSA Next Gen by PCR, Nasal     Status: None   Collection Time: 01/21/24 12:16 AM   Specimen: Nasal Mucosa; Nasal Swab  Result Value Ref Range Status   MRSA by PCR Next Gen NOT DETECTED NOT DETECTED Final    Comment: (NOTE) The GeneXpert MRSA Assay (FDA approved for NASAL specimens only), is one component of a comprehensive MRSA colonization surveillance program. It is not intended to diagnose MRSA infection nor to guide or monitor treatment for MRSA infections. Test performance is not FDA approved in patients less than 3 years old. Performed at [facility], [address], [place], [place] [address]   Culture, blood (Routine X 2) w Reflex to ID Panel     Status: None   Collection Time: 01/21/24 10:35 AM   Specimen: BLOOD  Result Value Ref Range Status   Specimen Description BLOOD LEFT  ANTECUBITAL  Final   Special Requests   Final    BOTTLES DRAWN AEROBIC AND ANAEROBIC Blood Culture adequate volume   Culture   Final    NO GROWTH 5 DAYS Performed at [facility], [address], [place], [place] [address]    Report Status 01/26/2024 FINAL  Final  Culture, blood (Routine X 2) w Reflex to ID Panel  Status: None   Collection Time: 01/21/24 10:35 AM   Specimen: BLOOD  Result Value Ref Range Status   Specimen Description BLOOD BLOOD RIGHT HAND  Final   Special Requests   Final    BOTTLES DRAWN AEROBIC AND ANAEROBIC Blood Culture adequate volume   Culture   Final    NO GROWTH 5 DAYS Performed at [facility], [address], [place], [place] [address]    Report Status 01/26/2024 FINAL  Final    Radiology Studies: No results found.  Scheduled Meds:  amLODipine   10 mg Oral Daily   carvedilol   12.5 mg Oral BID   Chlorhexidine  Gluconate Cloth  6 each Topical Q0600   Chlorhexidine  Gluconate Cloth  6 each Topical Q0600   Chlorhexidine  Gluconate Cloth  6 each Topical Q0600   cloNIDine   0.3 mg Oral Q8H   darbepoetin (ARANESP ) injection - DIALYSIS  150 mcg Subcutaneous Q Mon-1800   divalproex   500 mg Oral Daily   heparin   5,000 Units Subcutaneous Q8H   hydrALAZINE   100 mg Oral TID   isosorbide  mononitrate  120 mg Oral Daily   losartan   100 mg Oral QPM   pantoprazole   40 mg Oral BID   sevelamer  carbonate  1,600 mg Oral TID WC   sodium zirconium cyclosilicate   10 g Oral Q M,W,F   spironolactone   25 mg Oral Daily   Continuous Infusions:   LOS: 9 days   35 minutes with more than 50% spent in reviewing records, counseling patient/family and coordinating care.  Reyes VEAR Gaw, MD [facility] [email or web address] 01/29/2024, 5:30 PM

## 2024-01-29 NOTE — Progress Notes (Signed)
 PT Cancellation Note  Patient Details Name: [name] MRN: [id number] DOB: [birth year]   Cancelled Treatment:    Reason Eval/Treat Not Completed: (P) Patient at procedure or test/unavailable (at HD dept) will continue efforts per PT plan of care as schedule permits.    [name] M Layce Sprung 01/29/2024, 10:14 AM

## 2024-01-29 NOTE — Progress Notes (Signed)
 [facility] Progress Note   Subjective:    Seen in HD Standing wt 81kg   Objective Vitals:   01/29/24 0907 01/29/24 0930 01/29/24 1000 01/29/24 1030  BP: (!) 204/123 (!) 189/115 (!) 181/112 (!) 205/121  Pulse: 76 80 79 79  Resp: 15 16 13 14   Temp:      TempSrc:      SpO2: 100% 100% 98% 100%  Weight:      Height:       Physical Exam General: calm this morning, [name] O2 Heart:RRR Lungs: clear Abdomen:soft Extremities:trace edema Dialysis Access: RIJ TDC c/d/I, L AVG +t/b with swelling noted - no erythema or tenderness, no fluctuance  Dialysis Orders:  MWF NW  4h  B350   72.8kg  2K bath  Heparin  none  RIJ TDC Mircera 200 mcg q2wks - last 9/8 Calcitriol  0.76mcgmcg PO qHD - last 9/8 Sensipar 30mg  with HD - last 9/8    Assessment/Plan: HTN Emergency: current regimen -->  - amlodipine  10mg  every day - coreg  12.5 bid (refusing high dose d/t severe HA's) - clonidine  0.3 tid - hydralazine  100 tid -  labetalol  dc'd (refusing d/t severe side effects) - losartan  100mg  hs - cont to lower vol w/ HD - goal SBP < 180/ DBP < 105 (has hx of chronic uncont BP)  Rigors with dialysis: Patient c/o fevers/chills at his outpatient center after last 2-3 treatments. He's afebrile here, WBC wnl and negative blood cx's. Rigors happened again 9/11 so we used hyopallergenic Nipro dialyzer Friday and had no issues.  We didn't have any more of these filters in the hospital, so we used the usual (Revaclear) filter on Monday w/ a 2L saline flush and he did well. We did HD yesterday without the 2L flush and pt had rigors again that started late, just prior to end of dialysis, and cont'd for about 30 mins. Will need Nipro dialyzer filters here and at his OP unit. D/w outpatient unit manager yesterday and they are aware and will have the filters. Will have Nipro filter for HD today and extra HD tomorrow.  Access: Has a R TDC and L AVG. Per OP HD RN pt has been having pain on cannulation,  difficult cannulation and sometimes pulling clots. Had fistulogram this am 9/17 --> showed no stenosis within the AVG or at the outflow tract.  ESRD -  on HD MWF. HD today. HD tomorrow also, due to [facility] Volume - sig vol excess, is still 9 kg up pre HD this am. Will need extra HD tomorrow w/ max UF.  Anemia of CKD - Hgb 9 > 8s > 7s, ESA not due yet. Last dose 9/8 in outpatient, getting darbe 150 micrograms weekly sq 1st dose 9/15.   Nutrition - K+ levels up, switched back to renal diet  Myer Fret  MD  [facility] 01/29/2024, 10:57 AM  Recent Labs  Lab 01/27/24 0356 01/28/24 0322 01/29/24 0339 01/29/24 0747  HGB 8.1* 8.5* 8.5*  --   ALBUMIN   --   --   --  3.0*  CALCIUM  8.9  --   --  9.1  PHOS  --   --   --  5.6*  CREATININE 13.03*  --   --  13.44*  K 5.5*  --   --  6.0*    Inpatient medications:  amLODipine   10 mg Oral Daily   carvedilol   12.5 mg Oral BID   Chlorhexidine  Gluconate Cloth  6 each Topical Q0600   Chlorhexidine   Gluconate Cloth  6 each Topical Q0600   cloNIDine   0.3 mg Oral Q8H   darbepoetin (ARANESP ) injection - DIALYSIS  150 mcg Subcutaneous Q Mon-1800   divalproex   500 mg Oral Daily   heparin   5,000 Units Subcutaneous Q8H   hydrALAZINE   100 mg Oral TID   isosorbide  mononitrate  120 mg Oral Daily   losartan   100 mg Oral QPM   pantoprazole   40 mg Oral BID   sevelamer  carbonate  1,600 mg Oral TID WC   sodium zirconium cyclosilicate   10 g Oral Q M,W,F   spironolactone   25 mg Oral Daily    anticoagulant sodium citrate      acetaminophen , alteplase , anticoagulant sodium citrate , calcium  carbonate, docusate sodium , feeding supplement (NEPRO CARB STEADY), heparin , hydrALAZINE , HYDROmorphone  (DILAUDID ) injection, hydrOXYzine , lidocaine  (PF), lidocaine -prilocaine , loperamide , metoCLOPramide  (REGLAN ) injection, nitroGLYCERIN , ondansetron , mouth rinse, pentafluoroprop-tetrafluoroeth, polyethylene glycol

## 2024-01-29 NOTE — Progress Notes (Signed)
 OT Cancellation Note  Patient Details Name: [name] MRN: [id number] DOB: 21-Sep-1985   Cancelled Treatment:    Reason Eval/Treat Not Completed: Patient at procedure or test/ unavailable (HD)  [name] 01/29/2024, 9:49 AM

## 2024-01-29 NOTE — Progress Notes (Signed)
   01/28/24 2346 01/29/24 0019 01/29/24 0125  Vitals  BP (!) 210/95 (!) 210/95 (!) 208/107  MAP (mmHg) 129  --  133    01/29/24 0130 01/29/24 0330 01/29/24 0335  Vitals  BP (!) 219/103 (!) 205/121 (!) 195/122  MAP (mmHg) 132 145 141    01/29/24 0340 01/29/24 0354 01/29/24 0400  Vitals  BP (!) 222/110 (!) 222/110 (!) 211/115  MAP (mmHg) 137  --  141   0019 prn Hydralzine 20 mg IV given 0156 Labetalol  20 mg IV given per MD order 0321 prn Dilaudid  1 mg IV given for HA 10/10 0354 a.m. dose of Clonidine  0.3 mg PO given per MD order 0359 Labetalol  20 mg IV given per MD order

## 2024-01-29 NOTE — Progress Notes (Addendum)
 Received patient in bed to unit.  Alert and oriented.  Informed consent signed and in chart.   TX duration:3.5 hours  Patient tolerated well.  Transported back to the room  Alert, without acute distress.  Hand-off given to patient's nurse.   Access used: R internal jugular HD Cath Access issues: none  Total UF removed: 4.1L Medication(s) given: Tylenol , Dialudid, Hydralazine    01/29/24 1250  Vitals  Temp 99.1 F (37.3 C)  BP (!) 199/116  Pulse Rate 85  Resp 18  Oxygen Therapy  SpO2 100 %  O2 Device Nasal Cannula  Patient Activity (if Appropriate) In bed  Pulse Oximetry Type Continuous  Oximetry Probe Site Changed No  During Treatment Monitoring  Blood Flow Rate (mL/min) 300 mL/min  Arterial Pressure (mmHg) -146.25 mmHg  Venous Pressure (mmHg) 121.81 mmHg  TMP (mmHg) 11.92 mmHg  Ultrafiltration Rate (mL/min) 0 mL/min  Dialysate Flow Rate (mL/min) 299 ml/min  Dialysate Potassium Concentration 2  Dialysate Calcium  Concentration 2.5  Duration of HD Treatment -hour(s) 3.5 hour(s)  Cumulative Fluid Removed (mL) per Treatment  4147.78  HD Safety Checks Performed Yes  Intra-Hemodialysis Comments Tolerated well;Tx completed  Post Treatment  Dialyzer Clearance Lightly streaked  Liters Processed 72  Fluid Removed (mL) 4100 mL  Tolerated HD Treatment Yes  Post-Hemodialysis Comments HIGH BP caused him headache and chest pain  Hemodialysis Catheter Right Internal jugular Double lumen Permanent (Tunneled)  Placement Date/Time: 04/03/23 1228   Serial / Lot #: [id number]  Expiration Date: 10/10/27  Time Out: Correct patient;Correct site;Correct procedure  Maximum sterile barrier precautions: Hand hygiene;Cap;Mask;Sterile gown;Sterile gloves;Large sterile s...  Site Condition No complications  Blue Lumen Status Antimicrobial dead end cap;Dead end cap in place;Flushed  Red Lumen Status Antimicrobial dead end cap;Heparin  locked;Flushed  Purple Lumen Status N/A  Catheter fill  solution Heparin  1000 units/ml  Catheter fill volume (Arterial) 1.6 cc  Catheter fill volume (Venous) 1.6  Dressing Type Transparent  Dressing Status Antimicrobial disc/dressing in place;Clean, Dry, Intact  Drainage Description None  Dressing Change Due 02/05/24  Post treatment catheter status Capped and Clamped     Camellia Brasil LPN [facility]

## 2024-01-30 ENCOUNTER — Other Ambulatory Visit (HOSPITAL_COMMUNITY)

## 2024-01-30 ENCOUNTER — Inpatient Hospital Stay (HOSPITAL_COMMUNITY)

## 2024-01-30 DIAGNOSIS — I509 Heart failure, unspecified: Secondary | ICD-10-CM | POA: Diagnosis not present

## 2024-01-30 DIAGNOSIS — I3139 Other pericardial effusion (noninflammatory): Secondary | ICD-10-CM

## 2024-01-30 DIAGNOSIS — I169 Hypertensive crisis, unspecified: Secondary | ICD-10-CM | POA: Diagnosis not present

## 2024-01-30 DIAGNOSIS — R079 Chest pain, unspecified: Secondary | ICD-10-CM | POA: Diagnosis not present

## 2024-01-30 DIAGNOSIS — I517 Cardiomegaly: Secondary | ICD-10-CM

## 2024-01-30 LAB — RENAL FUNCTION PANEL
Albumin: 3 g/dL — ABNORMAL LOW (ref 3.5–5.0)
Anion gap: 16 — ABNORMAL HIGH (ref 5–15)
BUN: 54 mg/dL — ABNORMAL HIGH (ref 6–20)
CO2: 24 mmol/L (ref 22–32)
Calcium: 8.9 mg/dL (ref 8.9–10.3)
Chloride: 91 mmol/L — ABNORMAL LOW (ref 98–111)
Creatinine, Ser: 10.26 mg/dL — ABNORMAL HIGH (ref 0.61–1.24)
GFR, Estimated: 6 mL/min — ABNORMAL LOW (ref >60)
Glucose, Bld: 91 mg/dL (ref 70–99)
Phosphorus: 5.1 mg/dL — ABNORMAL HIGH (ref 2.5–4.6)
Potassium: 4.8 mmol/L (ref 3.5–5.1)
Sodium: 131 mmol/L — ABNORMAL LOW (ref 135–145)

## 2024-01-30 LAB — CBC
HCT: 26 % — ABNORMAL LOW (ref 39.0–52.0)
Hemoglobin: 8.1 g/dL — ABNORMAL LOW (ref 13.0–17.0)
MCH: 27.7 pg (ref 26.0–34.0)
MCHC: 31.2 g/dL (ref 30.0–36.0)
MCV: 89 fL (ref 80.0–100.0)
Platelets: 163 K/uL (ref 150–400)
RBC: 2.92 MIL/uL — ABNORMAL LOW (ref 4.22–5.81)
RDW: 17.5 % — ABNORMAL HIGH (ref 11.5–15.5)
WBC: 6 K/uL (ref 4.0–10.5)
nRBC: 0 % (ref 0.0–0.2)

## 2024-01-30 LAB — TROPONIN I (HIGH SENSITIVITY): Troponin I (High Sensitivity): 18 ng/L — ABNORMAL HIGH (ref <18)

## 2024-01-30 LAB — ECHOCARDIOGRAM LIMITED
Area-P 1/2: 3.91 cm2
Height: 68 in
S' Lateral: 3.2 cm
Weight: 2836 [oz_av]

## 2024-01-30 MED ORDER — DIPHENHYDRAMINE HCL 50 MG/ML IJ SOLN
INTRAMUSCULAR | Status: AC
  Filled 2024-01-30: qty 1

## 2024-01-30 MED ORDER — HEPARIN SODIUM (PORCINE) 1000 UNIT/ML IJ SOLN
INTRAMUSCULAR | Status: AC
  Filled 2024-01-30: qty 4

## 2024-01-30 MED ORDER — HEPARIN SODIUM (PORCINE) 1000 UNIT/ML DIALYSIS
1000.0000 [IU] | INTRAMUSCULAR | Status: DC | PRN
  Administered 2024-01-30: 3200 [IU]

## 2024-01-30 MED ORDER — CINACALCET HCL 30 MG PO TABS
30.0000 mg | ORAL_TABLET | ORAL | Status: DC
  Administered 2024-02-01 – 2024-02-08 (×4): 30 mg via ORAL
  Filled 2024-01-30 (×5): qty 1

## 2024-01-30 MED ORDER — DIPHENHYDRAMINE HCL 50 MG/ML IJ SOLN
50.0000 mg | Freq: Once | INTRAMUSCULAR | Status: AC
Start: 2024-01-30 — End: 2024-01-30
  Administered 2024-01-30: 50 mg via INTRAVENOUS

## 2024-01-30 MED ORDER — HYDRALAZINE HCL 20 MG/ML IJ SOLN
INTRAMUSCULAR | Status: AC
  Filled 2024-01-30: qty 1

## 2024-01-30 NOTE — Progress Notes (Signed)
  Echocardiogram 2D Echocardiogram has been performed.  [name] 01/30/2024, 4:03 PM

## 2024-01-30 NOTE — Progress Notes (Signed)
 Pt being picked up by dialysis. Attempted to refuse, because he had not been able to eat breakfast first. Pt educated on the importance of going for procedure. Transport waited until he was done to take down for dialysis.

## 2024-01-30 NOTE — Progress Notes (Signed)
 OT Cancellation Note  Patient Details Name: [name] MRN: [id number] DOB: 1985/07/03   Cancelled Treatment:    Reason Eval/Treat Not Completed: Patient at procedure or test/ unavailable.   Will check back as schedule allows.   CHRISTELLA Nest Lorraine-COTA/L  01/30/2024, 8:38 AM

## 2024-01-30 NOTE — Plan of Care (Signed)

## 2024-01-30 NOTE — Progress Notes (Signed)
 [facility] Progress Note   Subjective: Seen in [facility]. Getting ready to start dialysis. Attempting 6L UF goal today. Feels terrible --endorsing sob and general malaise. Using Nipro dialyzer with saline flushes   Objective Vitals:   01/30/24 0036 01/30/24 0304 01/30/24 0356 01/30/24 0600  BP: (!) 195/113 (!) 196/118 (!) 189/117 (!) 190/127  Pulse: 76 73 70 72  Resp: 20 15 19 18   Temp:  98.2 F (36.8 C)    TempSrc:  Oral    SpO2: 96% 96% 95% 94%  Weight:    79.7 kg  Height:         Additional Objective Labs: Basic Metabolic Panel: Recent Labs  Lab 01/27/24 0356 01/29/24 0747 01/30/24 0351  NA 127* 128* 131*  K 5.5* 6.0* 4.8  CL 92* 92* 91*  CO2 22 22 24   GLUCOSE 87 82 91  BUN 63* 74* 54*  CREATININE 13.03* 13.44* 10.26*  CALCIUM  8.9 9.1 8.9  PHOS  --  5.6* 5.1*   CBC: Recent Labs  Lab 01/26/24 0506 01/27/24 0356 01/28/24 0322 01/29/24 0339 01/30/24 0351  WBC 4.6 5.3 7.4 8.7 6.0  HGB 7.9* 8.1* 8.5* 8.5* 8.1*  HCT 24.9* 25.2* 26.8* 27.2* 26.0*  MCV 88.3 88.4 89.0 90.1 89.0  PLT 156 168 199 196 163   Blood Culture    Component Value Date/Time   SDES BLOOD LEFT ANTECUBITAL 01/21/2024 1035   SDES BLOOD BLOOD RIGHT HAND 01/21/2024 1035   SPECREQUEST  01/21/2024 1035    BOTTLES DRAWN AEROBIC AND ANAEROBIC Blood Culture adequate volume   SPECREQUEST  01/21/2024 1035    BOTTLES DRAWN AEROBIC AND ANAEROBIC Blood Culture adequate volume   CULT  01/21/2024 1035    NO GROWTH 5 DAYS Performed at [facility], [address] [place], [place] [address]    CULT  01/21/2024 1035    NO GROWTH 5 DAYS Performed at [facility], [address] [place], [place] [address]    REPTSTATUS 01/26/2024 FINAL 01/21/2024 1035   REPTSTATUS 01/26/2024 FINAL 01/21/2024 1035     Physical Exam General: Alert, nad, on nasal oxygen Heart: RRR Lungs: Coarse breath sounds; normal wob Abdomen: non tender Extremities: no sig LE edema Dialysis Access:  TDC; L AVG   Medications:   amLODipine   10 mg Oral Daily   carvedilol   12.5 mg Oral BID   Chlorhexidine  Gluconate Cloth  6 each Topical Q0600   Chlorhexidine  Gluconate Cloth  6 each Topical Q0600   Chlorhexidine  Gluconate Cloth  6 each Topical Q0600   cloNIDine   0.3 mg Oral Q8H   darbepoetin (ARANESP ) injection - DIALYSIS  150 mcg Subcutaneous Q Mon-1800   divalproex   500 mg Oral Daily   doxazosin   4 mg Oral Daily   heparin   5,000 Units Subcutaneous Q8H   hydrALAZINE   100 mg Oral TID   isosorbide  mononitrate  120 mg Oral Daily   losartan   100 mg Oral QPM   pantoprazole   40 mg Oral BID   sevelamer  carbonate  1,600 mg Oral TID WC   sodium zirconium cyclosilicate   10 g Oral Q M,W,F   spironolactone   25 mg Oral Daily    Dialysis Orders:  MWF [facility]  4h  B350   72.8kg  2K bath  Heparin  none  RIJ TDC Mircera 200 mcg q2wks - last 9/8 Calcitriol  0.75mcgmcg PO qHD  Sensipar  30mg  with HD  Assessment/Plan: HTN Emergency: current regimen -->  - amlodipine  10mg  every day - coreg  12.5  bid (refusing high dose d/t severe HA's) - clonidine  0.3 tid - hydralazine  100 tid -  labetalol  dc'd (refusing d/t severe side effects) - losartan  100mg  hs - cont to lower vol w/ HD - goal SBP < 180/ DBP < 105 (has hx of chronic uncont BP)   Rigors with dialysis: Patient c/o fevers/chills at his outpatient center after last 2-3 treatments. He's afebrile here, WBC wnl and negative blood cx's. Rigors happened again 9/11 so we used hyopallergenic Nipro dialyzer Friday and had no issues.  We didn't have any more of these filters in the hospital, so we used the usual (Revaclear) filter on Monday w/ a 2L saline flush and he did well. We did HD yesterday without the 2L flush and pt had rigors again that started late, just prior to end of dialysis, and cont'd for about 30 mins. Will need Nipro dialyzer filters here and at his OP unit. D/w outpatient unit manager yesterday and they are aware and will have the filters. Will  have Nipro filter for HD today  Access: Has a R TDC and L AVG. Per OP HD RN pt has been having pain on cannulation, difficult cannulation and sometimes pulling clots. Had fistulogram this am 9/17 --> showed no stenosis within the AVG or at the outflow tract.  ESRD -  on HD MWF. Extra HD today for volume removal  Volume - sig vol excess, is still 9 kg up pre HD this am. As above HD for volume removal.  Anemia of CKD - Hgb 8.1  ESA not due yet. Last dose 9/8 in outpatient, getting darbe 150 micrograms weekly sq 1st dose 9/15.   2HPTH - CorrCa/Phos acceptable. Continue home meds.  Nutrition - K+ levels up, switched back to renal diet    Maisie Ronnald Acosta PA-C [facility] 01/30/2024,8:50 AM

## 2024-01-30 NOTE — Consult Note (Addendum)
 Cardiology Consultation   Patient ID: [name] MRN: [id number]; DOB: 1985/05/18  Admit date: 01/20/2024 Date of Consult: 01/30/2024  PCP:  Lonnie Earnest, MD   [facility] Providers Cardiologist:  Newman JINNY Lawrence, MD   Patient Profile: [name] is a 38 y.o. 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 Elpidio.  History of Present Illness: Mr. Spatafore has history of Alport disease and has been on HD for several years now followed by nephrology with chronic issues of resistant hypertension.  History of heart failure with mildly reduced EF in 2023 but has subsequently normalized on echocardiograms thereafter.  Thought to be related to hypertensive disease but has not had formal ischemic evaluation.  He was recently seen 10/2023 found to have thrombosed fistula was taken for AVF revision, thrombectomy and hematoma evacuation on 6/14.  Hospital course was complicated by large pericardial effusion and eventually got pericardial window 6/20.  Follow-up echocardiogram showed resolving pericardial effusion that was trivial on echocardiogram 6/29.  His effusion was felt related to underlying ESRD.  Of note he has had chronic pericardial effusion dating back to 2023.  Patient presented to the ED 9/10 and has had prolonged hospital admission for weakness, chills, fatigue and other constitutional symptoms.  Reportedly had COVID a few weeks ago and was struggling to recover from this.  Throughout this admission patient has had recurrent episodes of chest pain during dialysis.  He has had multiple EKGs drawn this admission with flat troponins generally in the 30s.  Given persistent symptoms and concerns for T wave inversions in the lateral leads  cardiology was asked to see.  patient unable to hear very well so all communication was done through written text.  He reports that he has been having persistent complaints of chest pain since his procedure that has been exacerbated during dialysis sessions.  He does not note that the pain necessarily is worse with exertion.  Sometimes he does have shoulder pain.  Sometimes the pain is worse when he lays in certain positions.  Reports chronic shortness of breath.  He does not report any family history of MIs.  Smokes Black and milds.  BP has been persistently elevated as high as 227 systolic throughout this admission.  Potassium 4.8.  Creatinine 10.26.  Albumin  3.0.  Troponins 24-28-34-35-34 drawn between 9/10 and 9/17.  Hemoglobin 8.1.   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   Heart murmur    Hypertension    Low blood sugar    Marijuana abuse    Noncompliance    Pericardial effusion 03/28/2023   Tobacco abuse     Past Surgical History:  Procedure Laterality Date   A/V FISTULAGRAM Right 10/27/2022   Procedure: A/V Fistulagram;  Surgeon: Eliza Lonni RAMAN, MD;  Location: [facility] INVASIVE CV LAB;  Service: Cardiovascular;  Laterality: Right;   A/V FISTULAGRAM  Left 01/27/2024   Procedure: A/V Fistulagram;  Surgeon: Lanis Fonda BRAVO, MD;  Location: [facility] INVASIVE CV LAB;  Service: Cardiovascular;  Laterality: Left;   APPENDECTOMY     AV FISTULA PLACEMENT Left 03/03/2019   Procedure: ARTERIOVENOUS (AV) FISTULA CREATION LEFT ARM;  Surgeon: Sheree Penne Bruckner, MD;  Location: [facility] OR;  Service: Vascular;  Laterality: Left;   BASCILIC VEIN TRANSPOSITION Right 04/12/2019    Procedure: RIGHT UPPER EXTREMITY BASCILIC VEIN TRANSPOSITION FIRST STAGE FISTULA;  Surgeon: Eliza Bruckner RAMAN, MD;  Location: [facility] OR;  Service: Vascular;  Laterality: Right;   BIOPSY  10/14/2021   Procedure: BIOPSY;  Surgeon: Legrand Victory LITTIE DOUGLAS, MD;  Location: [facility] ENDOSCOPY;  Service: Gastroenterology;;   BIOPSY  06/06/2022   Procedure: BIOPSY;  Surgeon: Shila Gustav GAILS, MD;  Location: [facility] ENDOSCOPY;  Service: Gastroenterology;;   BIOPSY  11/12/2022   Procedure: BIOPSY;  Surgeon: Wilhelmenia Aloha Raddle., MD;  Location: [facility] ENDOSCOPY;  Service: Gastroenterology;;   BIOPSY  12/05/2022   Procedure: BIOPSY;  Surgeon: Stacia Glendia BRAVO, MD;  Location: [facility] ENDOSCOPY;  Service: Gastroenterology;;   COLONOSCOPY WITH PROPOFOL  N/A 10/14/2021   Procedure: COLONOSCOPY WITH PROPOFOL ;  Surgeon: Legrand Victory LITTIE DOUGLAS, MD;  Location: [facility] ENDOSCOPY;  Service: Gastroenterology;  Laterality: N/A;   DIALYSIS/PERMA CATHETER INSERTION N/A 04/03/2023   Procedure: DIALYSIS/PERMA CATHETER INSERTION;  Surgeon: Magda Debby SAILOR, MD;  Location: [facility] INVASIVE CV LAB;  Service: Cardiovascular;  Laterality: N/A;   ESOPHAGOGASTRODUODENOSCOPY N/A 11/12/2022   Procedure: ESOPHAGOGASTRODUODENOSCOPY (EGD);  Surgeon: Wilhelmenia Aloha Raddle., MD;  Location: [facility] ENDOSCOPY;  Service: Gastroenterology;  Laterality: N/A;   ESOPHAGOGASTRODUODENOSCOPY N/A 12/05/2022   Procedure: ESOPHAGOGASTRODUODENOSCOPY (EGD);  Surgeon: Stacia Glendia BRAVO, MD;  Location: [facility] ENDOSCOPY;  Service: Gastroenterology;  Laterality: N/A;   ESOPHAGOGASTRODUODENOSCOPY (EGD) WITH PROPOFOL  N/A 06/06/2022   Procedure: ESOPHAGOGASTRODUODENOSCOPY (EGD) WITH PROPOFOL ;  Surgeon: Shila Gustav GAILS, MD;  Location: [facility] ENDOSCOPY;  Service: Gastroenterology;  Laterality: N/A;   FISTULA SUPERFICIALIZATION Right 11/07/2022   Procedure: PLICATION OF LARGE ANEURYSMS OF RIGHT ARTERIOVENOUS FISTULA WITH INSERTION OF 7mm INTERPOSITIONAL GORTEX GRAFT;  Surgeon: Eliza Bruckner RAMAN, MD;   Location: [facility] OR;  Service: Vascular;  Laterality: Right;   FLEXIBLE SIGMOIDOSCOPY N/A 04/14/2022   Procedure: FLEXIBLE SIGMOIDOSCOPY;  Surgeon: Shila Gustav GAILS, MD;  Location: [facility] ENDOSCOPY;  Service: Gastroenterology;  Laterality: N/A;   INSERTION OF DIALYSIS CATHETER N/A 11/07/2022   Procedure: INSERTION OF RIGHT INTERNAL JUGULAR TUNNELED DIALYSIS CATHETER;  Surgeon: Eliza Bruckner RAMAN, MD;  Location: [facility] OR;  Service: Vascular;  Laterality: N/A;   LIGATION OF ARTERIOVENOUS  FISTULA Left 03/06/2019   Procedure: LIGATION OF ARTERIOVENOUS  FISTULA;  Surgeon: Gretta Bruckner PARAS, MD;  Location: [facility] OR;  Service: Vascular;  Laterality: Left;   PERIPHERAL VASCULAR BALLOON ANGIOPLASTY  10/27/2022   Procedure: PERIPHERAL VASCULAR BALLOON ANGIOPLASTY;  Surgeon: Eliza Bruckner RAMAN, MD;  Location: [facility] INVASIVE CV LAB;  Service: Cardiovascular;;  rt upper arm fistula   SAVORY DILATION N/A 11/12/2022   Procedure: SAVORY DILATION;  Surgeon: Wilhelmenia Aloha Raddle., MD;  Location: [facility] ENDOSCOPY;  Service: Gastroenterology;  Laterality: N/A;   spinal tap     SPINE SURGERY     related to a spinal infection, unsure of surgery or infection source   TRANSESOPHAGEAL ECHOCARDIOGRAM (CATH LAB) N/A 04/02/2023   Procedure: TRANSESOPHAGEAL ECHOCARDIOGRAM;  Surgeon: Jeffrie Oneil BROCKS, MD;  Location: [facility] INVASIVE CV LAB;  Service: Cardiovascular;  Laterality: N/A;   WISDOM TOOTH EXTRACTION       Scheduled Meds:  amLODipine   10 mg Oral Daily   carvedilol   12.5 mg Oral BID   Chlorhexidine  Gluconate Cloth  6 each Topical Q0600   Chlorhexidine  Gluconate Cloth  6 each Topical Q0600   Chlorhexidine  Gluconate Cloth  6 each Topical Q0600   [START ON 02/01/2024] cinacalcet   30 mg Oral Q M,W,F-1800   cloNIDine   0.3 mg Oral Q8H   darbepoetin (ARANESP ) injection - DIALYSIS  150 mcg Subcutaneous Q Mon-1800   divalproex   500 mg Oral Daily   doxazosin   4 mg Oral Daily   heparin   5,000 Units Subcutaneous Q8H   hydrALAZINE   100  mg Oral TID   isosorbide  mononitrate  120 mg Oral Daily   losartan   100 mg Oral QPM   pantoprazole   40 mg Oral BID   sevelamer  carbonate  1,600 mg Oral TID WC   sodium zirconium cyclosilicate   10 g Oral Q M,W,F   spironolactone   25 mg Oral Daily   Continuous Infusions:  PRN Meds: acetaminophen , calcium  carbonate, docusate sodium , hydrALAZINE , HYDROmorphone  (DILAUDID ) injection, hydrOXYzine , loperamide , metoCLOPramide  (REGLAN ) injection, nitroGLYCERIN , ondansetron  (ZOFRAN ) IV, ondansetron , mouth rinse, oxyCODONE , polyethylene glycol  Allergies:    Allergies  Allergen Reactions   Nsaids Other (See Comments)    Contraindication due to ckd    Zestril [Lisinopril] Swelling   Risperdal  [Risperidone ] Other (See Comments)    Unknown reaction     Social History:   Social History   Socioeconomic History   Marital status: Married    Spouse name: Not on file   Number of children: 3   Years of education: Not on file   Highest education level: Not on file  Occupational History   Occupation: HVAC  Tobacco Use   Smoking status: Every Day    Current packs/day: 0.00    Types: Cigarettes    Last attempt to quit: 02/27/2016    Years since quitting: 7.9   Smokeless tobacco: Never   Tobacco comments:    Returned to smoking 2 black and milds per day  Vaping Use   Vaping status: Never Used  Substance and Sexual Activity   Alcohol use: Not Currently    Comment: rare   Drug use: Yes    Types: Marijuana   Sexual activity: Not Currently  Other Topics Concern   Not on file  Social History Narrative   Not on file   Social Drivers of Health   Financial Resource Strain: Not on file  Food Insecurity: No Food Insecurity (01/21/2024)   Hunger Vital Sign    Worried About Running Out of Food in the Last Year: Never true    Ran Out of Food in the Last Year: Never true  Transportation Needs: No Transportation Needs (01/21/2024)   PRAPARE - Administrator, Civil Service (Medical):  No    Lack of Transportation (Non-Medical): No  Physical Activity: Inactive (06/02/2023)   Exercise Vital Sign    Days of Exercise per Week: 0 days    Minutes of Exercise per Session: 0 min  Stress: Stress Concern Present (06/02/2023)   [employer] of Occupational Health - Occupational Stress Questionnaire    Feeling of Stress : Rather much  Social Connections: Unknown (09/20/2021)   [facility] from [facility]   Social Network    Social Network: Not on file  Intimate Partner Violence: Not At Risk (01/21/2024)   Humiliation, Afraid, Rape, and Kick questionnaire    Fear of Current or Ex-Partner: No    Emotionally Abused: No  Physically Abused: No    Sexually Abused: No    Family History:   Family History  Problem Relation Age of Onset   Hypertension Mother    Kidney failure Mother    Diabetes Mother    Hearing loss Father    Hypertension Sister    Heart disease Maternal Grandmother    Stroke Maternal Grandfather    Asthma Son      ROS:  Please see the history of present illness.  All other ROS reviewed and negative.     Physical Exam/Data: Vitals:   01/30/24 1615 01/30/24 1630 01/30/24 1730 01/30/24 1736  BP: (!) 205/101 (!) 191/112 (!) 159/89   Pulse:    73  Resp:   14 16  Temp:    97.8 F (36.6 C)  TempSrc:    Oral  SpO2:    98%  Weight:      Height:        Intake/Output Summary (Last 24 hours) at 01/30/2024 1751 Last data filed at 01/30/2024 1400 Gross per 24 hour  Intake 170 ml  Output 6000 ml  Net -5830 ml      01/30/2024    2:00 PM 01/30/2024    8:54 AM 01/30/2024    6:00 AM  Last 3 Weights  Weight (lbs) 164 lb 0.4 oz 177 lb 4 oz 175 lb 11.3 oz  Weight (kg) 74.4 kg 80.4 kg 79.7 kg     Body mass index is 24.94 kg/m.  General:  Well nourished, well developed, in no acute distress HEENT: normal Neck: no JVD Vascular: No carotid bruits; Distal pulses 2+ bilaterally Cardiac:  normal S1, S2; RRR; no murmur  Lungs:  clear to auscultation  bilaterally, no wheezing, rhonchi or rales  Abd: soft, nontender, no hepatomegaly  Ext: no edema Musculoskeletal:  No deformities, BUE and BLE strength normal and equal Skin: warm and dry  Neuro:  CNs 2-12 intact, no focal abnormalities noted Psych:  Normal affect   EKG:  The EKG was personally reviewed and demonstrates: Sinus rhythm, heart rate 74.  LVH.  T wave inversions laterally. Telemetry:  Telemetry was personally reviewed and demonstrates: Sinus rhythm  Relevant CV Studies: Echocardiogram 12/30/2023  1. Left ventricular ejection fraction, by estimation, is 60 to 65%. Left  ventricular ejection fraction by PLAX is 65 %. The left ventricle has  normal function. The left ventricle has no regional wall motion  abnormalities. There is moderate left  ventricular hypertrophy.   2. There is no evidence of cardiac tamponade.   3. The aortic valve is tricuspid. Aortic valve regurgitation is not  visualized.   4. Moderately dilated pulmonary artery.   5. There is normal pulmonary artery systolic pressure. The estimated  right ventricular systolic pressure is 28.8 mmHg.   6. The inferior vena cava is normal in size with greater than 50%  respiratory variability, suggesting right atrial pressure of 3 mmHg.   7. Trivial circumferential pericardial effusion.   Comparison(s): Changes from prior study are noted. 12/28/2023: LVEF 55-60%,  severe LVH, small pericardial effusion near the RV. Compared to this prior  study, the pericardial effusion is smaller (now trivial in size).   Laboratory Data: High Sensitivity Troponin:   Recent Labs  Lab 01/20/24 2100 01/21/24 0110 01/26/24 1828 01/26/24 2011 01/27/24 0001  TROPONINIHS 24* 28* 34* 35* 34*     Chemistry Recent Labs  Lab 01/27/24 0356 01/29/24 0747 01/30/24 0351  NA 127* 128* 131*  K 5.5* 6.0* 4.8  CL  92* 92* 91*  CO2 22 22 24   GLUCOSE 87 82 91  BUN 63* 74* 54*  CREATININE 13.03* 13.44* 10.26*  CALCIUM  8.9 9.1 8.9   GFRNONAA 5* 4* 6*  ANIONGAP 13 14 16*    Recent Labs  Lab 01/29/24 0747 01/30/24 0351  ALBUMIN  3.0* 3.0*   Lipids No results for input(s): CHOL, TRIG, HDL, LABVLDL, LDLCALC, CHOLHDL in the last 168 hours.  Hematology Recent Labs  Lab 01/28/24 0322 01/29/24 0339 01/30/24 0351  WBC 7.4 8.7 6.0  RBC 3.01* 3.02* 2.92*  HGB 8.5* 8.5* 8.1*  HCT 26.8* 27.2* 26.0*  MCV 89.0 90.1 89.0  MCH 28.2 28.1 27.7  MCHC 31.7 31.3 31.2  RDW 17.5* 17.5* 17.5*  PLT 199 196 163   Thyroid No results for input(s): TSH, FREET4 in the last 168 hours.  BNPNo results for input(s): BNP, PROBNP in the last 168 hours.  DDimer No results for input(s): DDIMER in the last 168 hours.  Radiology/Studies:  ECHOCARDIOGRAM LIMITED Result Date: 01/30/2024    ECHOCARDIOGRAM LIMITED REPORT   Patient Name:   BRAND SIEVER Date of Exam: 01/30/2024 Medical Rec #:  [id number]       Height:       68.0 in Accession #:    [id number]      Weight:       177.2 lb Date of Birth:  03-Apr-1986       BSA:          1.941 m Patient Age:    38 years        BP:           204/119 mmHg Patient Gender: M               HR:           74 bpm. Exam Location:  Inpatient Procedure: Limited Echo (Both Spectral and Color Flow Doppler were utilized            during procedure). Indications:    pericardial effusion  History:        Patient has prior history of Echocardiogram examinations, most                 recent 12/28/2023. End stage renal disease; Risk Factors:Current                 Smoker and Hypertension.  Sonographer:    Tinnie Barefoot RDCS Referring Phys: [phone] JEFFREY H WALDEN IMPRESSIONS Limited echo for pericardial effusion  1. Left ventricular ejection fraction, by estimation, is 60 to 65%. Left ventricular ejection fraction by PLAX is 65 %. The left ventricle has normal function. The left ventricle has no regional wall motion abnormalities. There is moderate left ventricular hypertrophy.  2. There is no evidence of  cardiac tamponade.  3. The aortic valve is tricuspid. Aortic valve regurgitation is not visualized.  4. Moderately dilated pulmonary artery.  5. There is normal pulmonary artery systolic pressure. The estimated right ventricular systolic pressure is 28.8 mmHg.  6. The inferior vena cava is normal in size with greater than 50% respiratory variability, suggesting right atrial pressure of 3 mmHg.  7. Trivial circumferential pericardial effusion. Comparison(s): Changes from prior study are noted. 12/28/2023: LVEF 55-60%, severe LVH, small pericardial effusion near the RV. Compared to this prior study, the pericardial effusion is smaller (now trivial in size). FINDINGS  Left Ventricle: Left ventricular ejection fraction, by estimation, is 60 to 65%. Left ventricular ejection fraction by PLAX is 65 %.  The left ventricle has normal function. The left ventricle has no regional wall motion abnormalities. The left ventricular internal cavity size was normal in size. There is moderate left ventricular hypertrophy. Right Ventricle: There is normal pulmonary artery systolic pressure. The tricuspid regurgitant velocity is 2.54 m/s, and with an assumed right atrial pressure of 3 mmHg, the estimated right ventricular systolic pressure is 28.8 mmHg. Pericardium: Trivial pericardial effusion is present. The pericardial effusion is circumferential. There is no evidence of cardiac tamponade. Tricuspid Valve: The tricuspid valve is grossly normal. Tricuspid valve regurgitation is mild. Aortic Valve: The aortic valve is tricuspid. Aortic valve regurgitation is not visualized. Pulmonary Artery: The pulmonary artery is moderately dilated. Venous: The inferior vena cava is normal in size with greater than 50% respiratory variability, suggesting right atrial pressure of 3 mmHg. Additional Comments: Spectral Doppler performed. Color Doppler performed.  LEFT VENTRICLE PLAX 2D LV EF:         Left ventricular ejection fraction by PLAX is 65 %.  LVIDd:         5.00 cm LVIDs:         3.20 cm LV PW:         1.40 cm LV IVS:        1.30 cm  IVC IVC diam: 1.90 cm LEFT ATRIUM         Index LA diam:    4.20 cm 2.16 cm/m  AORTIC VALVE LVOT Vmax:   135.00 cm/s LVOT Vmean:  91.300 cm/s LVOT VTI:    0.246 m MITRAL VALVE                TRICUSPID VALVE MV Area (PHT): 3.91 cm     TR Peak grad:   25.8 mmHg MV Decel Time: 194 msec     TR Vmax:        254.00 cm/s MV E velocity: 106.00 cm/s MV A velocity: 44.00 cm/s   SHUNTS MV E/A ratio:  2.41         Systemic VTI: 0.25 m Vinie Maxcy MD Electronically signed by Vinie Maxcy MD Signature Date/Time: 01/30/2024/4:42:25 PM    Final    PERIPHERAL VASCULAR CATHETERIZATION Result Date: 01/27/2024 Images from the original result were not included.   Patient name: [name]        MRN: [id number]        DOB: March 24, 1986          Sex: male  01/27/2024 Pre-operative Diagnosis: Fistula malfunction Post-operative diagnosis:  Same Surgeon:  Fonda FORBES Rim, MD Procedure Performed: 1.  Ultrasound-guided micropuncture access of the left arm AV graft 2.  Fistulogram  Indications: Patient is a 38 year old male with history of multiple access procedures for end-stage renal disease requiring dialysis.  He is currently being dialyzed through a left arm AV graft.  They have had difficulty with cannulation and there is a hematoma present.  After discussing the risks and benefits of left arm fistulagram in an effort to assess flow, the patient elected to proceed.  Findings:  No flow-limiting stenosis within the left arm AV graft.  Widely patent anastomosis both proximally and distally.  No outflow stenosis.             Procedure:  The patient was identified in the holding area and taken to room 8.  The patient was then placed supine on the table and prepped and draped in the usual sterile fashion.  A time out was called.  Ultrasound was used to evaluate  the left arm AV graft.  This was accessed using a micropuncture needle, and exchanged  for a micropuncture sheath using Seldinger technique.  Left arm fistulogram followed.  See results above.  No intervention.  Recommend graft rest due to hematoma.  Recommend continued dialysis through the tunneled dialysis catheter.  Can follow-up with his primary surgeons at [facility].    Fonda FORBES Rim MD [facility] Office: [phone]    DG CHEST [name] 1 VIEW Result Date: 01/26/2024 CLINICAL DATA:  141880 SOB (shortness of breath) [id number] EXAM: PORTABLE CHEST 1 VIEW COMPARISON:  Chest x-ray 12/26/2023 FINDINGS: Right chest wall dialysis catheter with tip overlying the right [facility]. The heart and mediastinal contours are within normal limits. Right base atelectasis. No focal consolidation. No pulmonary edema. No pleural effusion. No pneumothorax. No acute osseous abnormality. IMPRESSION: No active disease. Electronically Signed   By: Morgane  Naveau M.D.   On: 01/26/2024 22:27     Assessment and Plan:  Chest pain Patient since his pericardial window has had what appears to be overall atypical complaints of chest pain that have been exacerbated during dialysis sessions.  His serial EKGs are overall unchanged.  He does have significant LVH so therefore has underlying repolarization abnormalities that intermittently show T wave inversions with varying degree over the past several months.  Troponins in the 30s do not suggest ACS and have been flat throughout this admission given he has had multiple draws due to these complaints during dialysis sessions.   Low suspicion for ACS.  Certainly does have risk factors.  We Kylin Genna follow troponins but suspect we could likely do outpatient ischemic evaluation. Adrick Kestler discuss with MD.  Pericardial effusion status post pericardial window 10/2023 Performed at [facility], effusion felt to be related to his underlying ESRD.  Echocardiogram this admission shows preserved biventricular function.  Moderate concentric LVH.  Moderately  dilated pulmonary arteries.  RVSP normal.  Trivial circumferential pericardial effusion.  Alport syndrome ESRD on HD Nephrology is following. Almost deaf.   Heart failure with recovered EF Uncontrolled hypertension History of mildly reduced EF 2023 but has subsequently normalized on echocardiograms thereafter.  Overall euvolemic.  Volume status managed by dialysis.  Systolics persistently in the 200s. Nephrology manages his medications.  Currently on amlodipine  10 mg, carvedilol  12.5 mg twice daily, clonidine  0.3 mg Q8, doxazosin  4 mg, hydralazine  100 mg 3 times daily, Imdur  120 mg, losartan  100 mg, spironolactone  25 mg. Consider follow-up with advanced hypertension clinic at discharge   Risk Assessment/Risk Scores:   For questions or updates, please contact [facility] Please consult [email or web address] for contact info under    Signed, Thom LITTIE Sluder, PA-C  01/30/2024 5:51 PM    Ellaree LITTIE Marlette was seen by me today along with Thom Sluder. I have personally performed an evaluation on this patient.  My findings are as follows: 38 y.o. male he has a past medical history as above.  He presented to the emergency room 01/20/2024 and has had a prolonged hospitalization due to weakness, chills, fatigue.  Few weeks prior, he had COVID.  Throughout the admission, he has had worsening chest pain associated with dialysis.  Multiple EKGs have been done as well as troponins drawn which have always been in the low 30s.  EKGs have shown LVH with repolarization abnormalities and T wave inversions that at times have been new.  He had recurrent symptoms today.  He states that the pain is in the left side of his  chest, nonradiating.  At times he feels like he has some numbness in his jaw.  He previously stated that the pain is worse with certain positions, but to me he states that the pain is not positional..  Data: EKG(s) and pertinent labs, studies, etc were personally reviewed and interpreted by  me:  EKG, telemetry, labs Otherwise, I agree with data as outlined by the advanced practice provider.  Exam performed by me: Gen: No acute distress Neck: None JVD Cardiac: Regular rhythm Lungs: Normal work of breathing Extremities: No edema  My Assessment and Plan:  1.  Chest pain: Patient has had pain since a prior pericardial window.  He has pain during dialysis sessions.  Prior episodes of chest pain with only mildly elevated troponins.  His troponins are not elevated today at 18.  He is chest pain-free currently.  Shaterra Sanzone hold off on ischemic evaluation at this time.  2.  Pericardial effusion: Post pericardial window 10/2023.  Performed [facility] and felt related to underlying renal disease.  Echo with preserved BiV function this admission. Uncontrolled hypertension: Plan per nephrology.  Signed,  Mychelle Kendra Gladis Norton, MD  01/30/2024 8:48 PM

## 2024-01-30 NOTE — Progress Notes (Signed)
 PROGRESS NOTE  [name]  FMW:[id number] DOB: September 09, 1985 DOA: 01/20/2024 PCP: Lonnie Earnest, MD  Consultants  Brief Narrative: 38 year old man with PMHx significant for HTN (difficult to control), HFpEF (Echo 12/2023 with EF 55-60%, severe concentric LVH, G2DD, small to moderate pericardial effusion), Alport syndrome, ESRD on HD (MWFS via RIJ TDC), GERD, anemia of chronic disease, bipolar 1 disorder, hearing loss, depression, tobacco use. Recent admission 8/16 - 8/18 for malaise/body aches/GI symptoms/dizziness, presented to [facility] ED 9/10 for weakness, chills, body aches, fatigue, facial numbness x 2 weeks. Last HD 9/8. Patient states that he recently had COVID a few weeks ago and was recovering from that when he began to feel poorly again. Was tested for COVID again at HD and reportedly negative. Has had rigor-like shaking chills during HD and feels like he cannot get warm (notes that his house thermostat is set at 84 degrees post-treatment). He reports fever/chills, no CP/SOB, found to be COVID+ with hypertensive urgency.   En route with EMS, HR 86, BP 233/139, RR 86, SpO2 99% on RA. On ED arrival, patient was afebrile with HR 90s, BP 225/128, RR 21, SpO2 100%. Labs were notable for WBC 7.1, Hgb 8.3, Plt 255. Na 135, K 4.6, CO2 22, BUN/Cr 54/16.10. AST/ALT WNL, Alk Phos 139, Tbili 0.9. Trop 24. BNP 1736.2. CT Head NAICA. Labetalol  10mg  IV x 1 given without improvement in BP. Cardene  gtt was initiated.  Admitted under PCCM, eventually Cardene  stopped and he was transferred to [facility] on 01/23/2024.  Despite this, BPs have remained elevated throughout entire admission.   Assessment & Plan: Hypertensive crisis History of HTN, refractory/difficult to control c/b poor compliance/admitted with hypertensive crisis, POA: - aldosterone-renin panel pending.  Initially needed Cardene  which was weaned off.  When patient was transitioned under [facility], patient was on home dose of nifedipine  90 mg XL, hydralazine  100  mg 3 times daily, labetalol  200 mg twice daily, losartan  100 mg daily and yet blood pressure was significantly uncontrolled with systolic in 200 range and diastolic in 100 range.  Transitioned his nifedipine  to amlodipine  and started him on Imdur  30 mg on 01/23/2023.  Imdur  eventually escalated to maximum dose of 120 mg due to uncontrolled blood pressure and then clonidine  0.3 mg 3 times daily was added.  -  Patient's blood pressure was finally somewhat controlled however patient declined taking labetalol  stating that labetalol  caused him to fall asleep and he becomes lethargic and does not feel well.   - Per nephrology, he had tolerated low-dose of Coreg  before.  They talked to him started him on Coreg  12.5 mg p.o. twice daily.  At this point in time, patient is maxed out on all the antihypertensive medications.  Nephrology knows him very well and per them, blood pressure 180/105 or less will be okay for him to go home.   - BP has remained notably elevated past 24 hours, systolics 180s.  Greatly appreciate nephrology input.   - on review of his recent [facility] admission, doxazosin  eventually added to regimen and he was DC'ed on that.  Unclear why stopped, will restart here.  - elevated today, see chest pain below.      HFpEF / BLE edema Echo 12/2023 with EF 55-60%, severe concentric LVH, G2DD, small to moderate pericardial effusion. Optimize electrolytes for K > 4, Mg > 2.  Volume removal by HD - Echo reviewed from yesterday, being performed this afternoon.  Chest pain:  - had episode of chest pain during dialysis again today.  Different from yesterday, yesterday described sharp stabbing pain but this time more pressure on chest, continued after stopping dialysis while yesterday it stopped during dialysis. -EKG obtained, some deeper T wave inversion in lateral leads compared to yesterday but otherwise unchanged. LVH noted. - Stat troponin pending, not yet drawn. - Patient given 1 dose  nitroglycerin  under the tongue, pain somewhat resolved. - Will call cardiology due to new chest pressure in light of persistently elevated blood pressures.   ESRD on HD (MWFS via [facility] TDC) Alport syndrome LUE AVG dysfunction Followed at [facility] for transplant evaluation. He tolerated 1hr of HD on 9/11 due to report of rigors and dizziness. No objective fever. Blood cultures were sent off.  - nephrology tried hypoallergenic dialyzer, no issues with that.  Cultures are negative and he is afebrile again.  He had rigors again yesterday, nephrology working with HD manager to get hypoallergenic dialysate for here and at his outpatient unit.    Hard of hearing: - secondary to Alport syndrome above.  Sometimes requires writing down questions to answer better   Hyperkalemia: Resolved, nephrology managing.   Diarrhea, acute-on-chronic Increased frequency of diarrhea, per patient.   Improved now, monitor.    Anemia of chronic disease - Trend H&H - Monitor for signs of active bleeding - Transfuse for Hgb < 7.0 or hemodynamically significant bleeding.  Hemoglobin labile, currently 8.5.  Likely secondary to ESRD   GERD - PPI   Bipolar 1 disorder Depression - Continue home Depakote    Tobacco use - Encourage cessation - Nicotine  patch.      DVT prophylaxis:  heparin  injection 5,000 Units Start: 01/21/24 0600 SCDs Start: 01/20/24 2230  Code Status:   Code Status: Full Code Level of care: Progressive Status is: Inpatient   Consults called: Nephrology, Cardiology  Subjective: Pt seen after dialysis, had recurrence of chest pain but now with more chest pressure compared to yesterday.  Dialysis stopped early.  Pain has persisted.  Given NTG x 1 which helped somewhat with pain while I was in the room.  Also with mild headache  Objective: Vitals:   01/30/24 1355 01/30/24 1400 01/30/24 1444 01/30/24 1520  BP: (!) 200/118 (!) 204/119 (!) 227/114 (!) 215/115  Pulse: 96 73    Resp: 18 15     Temp:      TempSrc:      SpO2: 100% 100%    Weight:      Height:        Intake/Output Summary (Last 24 hours) at 01/30/2024 1554 Last data filed at 01/30/2024 1400 Gross per 24 hour  Intake 170 ml  Output 6000 ml  Net -5830 ml   Filed Weights   01/29/24 1251 01/30/24 0600 01/30/24 0854  Weight: 77.8 kg 79.7 kg 80.4 kg   Body mass index is 26.95 kg/m.  Gen: 38 y.o. male in no apparent distress.  Nontoxic, but looks uncomfortable Pulm: Non-labored breathing.  Clear to auscultation bilaterally.  CV: Regular rate and rhythm. No murmur, rub, or gallop. No JVD GI: Abdomen soft, non-tender, non-distended, with normoactive bowel sounds. No organomegaly or masses felt. Ext: Warm, no deformities Skin: No rashes, lesions noulcers Neuro: Alert and oriented. No focal neurological deficits. Psych: Calm  Judgement and insight appear normal. Mood & affect appropriate.     I have personally reviewed the following labs and images: CBC: Recent Labs  Lab 01/26/24 0506 01/27/24 0356 01/28/24 0322 01/29/24 0339 01/30/24 0351  WBC 4.6 5.3 7.4 8.7 6.0  HGB 7.9* 8.1*  8.5* 8.5* 8.1*  HCT 24.9* 25.2* 26.8* 27.2* 26.0*  MCV 88.3 88.4 89.0 90.1 89.0  PLT 156 168 199 196 163   BMP &GFR Recent Labs  Lab 01/25/24 0327 01/26/24 0506 01/27/24 0356 01/29/24 0747 01/30/24 0351  NA 131* 129* 127* 128* 131*  K 5.5* 5.0 5.5* 6.0* 4.8  CL 95* 91* 92* 92* 91*  CO2 20* 24 22 22 24   GLUCOSE 84 85 87 82 91  BUN 65* 47* 63* 74* 54*  CREATININE 14.60* 10.55* 13.03* 13.44* 10.26*  CALCIUM  8.8* 8.9 8.9 9.1 8.9  PHOS  --   --   --  5.6* 5.1*   Estimated Creatinine Clearance: 9.6 mL/min (A) (by C-G formula based on SCr of 10.26 mg/dL (H)). Liver & Pancreas: Recent Labs  Lab 01/29/24 0747 01/30/24 0351  ALBUMIN  3.0* 3.0*   No results for input(s): LIPASE, AMYLASE in the last 168 hours. No results for input(s): AMMONIA in the last 168 hours. Diabetic: No results for input(s): HGBA1C in  the last 72 hours. Recent Labs  Lab 01/24/24 1903 01/26/24 1205 01/28/24 1444 01/29/24 1728 01/29/24 2321  GLUCAP 123* 74 110* 111* 109*   Cardiac Enzymes: No results for input(s): CKTOTAL, CKMB, CKMBINDEX, TROPONINI in the last 168 hours. No results for input(s): PROBNP in the last 8760 hours. Coagulation Profile: No results for input(s): INR, PROTIME in the last 168 hours. Thyroid Function Tests: No results for input(s): TSH, T4TOTAL, FREET4, T3FREE, THYROIDAB in the last 72 hours. Lipid Profile: No results for input(s): CHOL, HDL, LDLCALC, TRIG, CHOLHDL, LDLDIRECT in the last 72 hours. Anemia Panel: No results for input(s): VITAMINB12, FOLATE, FERRITIN, TIBC, IRON , RETICCTPCT in the last 72 hours. Urine analysis:    Component Value Date/Time   COLORURINE STRAW (A) 06/07/2021 1758   APPEARANCEUR CLEAR 06/07/2021 1758   LABSPEC 1.008 06/07/2021 1758   PHURINE 6.0 06/07/2021 1758   GLUCOSEU 50 (A) 06/07/2021 1758   HGBUR SMALL (A) 06/07/2021 1758   BILIRUBINUR NEGATIVE 06/07/2021 1758   KETONESUR NEGATIVE 06/07/2021 1758   PROTEINUR >=300 (A) 06/07/2021 1758   UROBILINOGEN 0.2 05/29/2013 1121   NITRITE NEGATIVE 06/07/2021 1758   LEUKOCYTESUR NEGATIVE 06/07/2021 1758   Sepsis Labs: Invalid input(s): PROCALCITONIN, LACTICIDVEN  Microbiology: Recent Results (from the past 240 hours)  MRSA Next Gen by PCR, Nasal     Status: None   Collection Time: 01/21/24 12:16 AM   Specimen: Nasal Mucosa; Nasal Swab  Result Value Ref Range Status   MRSA by PCR Next Gen NOT DETECTED NOT DETECTED Final    Comment: (NOTE) The GeneXpert MRSA Assay (FDA approved for NASAL specimens only), is one component of a comprehensive MRSA colonization surveillance program. It is not intended to diagnose MRSA infection nor to guide or monitor treatment for MRSA infections. Test performance is not FDA approved in patients less than 59  years old. Performed at [facility], [address] [place], [place] [address]   Culture, blood (Routine X 2) w Reflex to ID Panel     Status: None   Collection Time: 01/21/24 10:35 AM   Specimen: BLOOD  Result Value Ref Range Status   Specimen Description BLOOD LEFT ANTECUBITAL  Final   Special Requests   Final    BOTTLES DRAWN AEROBIC AND ANAEROBIC Blood Culture adequate volume   Culture   Final    NO GROWTH 5 DAYS Performed at [facility], [address], [place], [place] [address]    Report Status  01/26/2024 FINAL  Final  Culture, blood (Routine X 2) w Reflex to ID Panel     Status: None   Collection Time: 01/21/24 10:35 AM   Specimen: BLOOD  Result Value Ref Range Status   Specimen Description BLOOD BLOOD RIGHT HAND  Final   Special Requests   Final    BOTTLES DRAWN AEROBIC AND ANAEROBIC Blood Culture adequate volume   Culture   Final    NO GROWTH 5 DAYS Performed at [facility], [address] [place], [place] [address]    Report Status 01/26/2024 FINAL  Final    Radiology Studies: No results found.  Scheduled Meds:  amLODipine   10 mg Oral Daily   carvedilol   12.5 mg Oral BID   Chlorhexidine  Gluconate Cloth  6 each Topical Q0600   Chlorhexidine  Gluconate Cloth  6 each Topical Q0600   Chlorhexidine  Gluconate Cloth  6 each Topical Q0600   [START ON 02/01/2024] cinacalcet   30 mg Oral Q M,W,F-1800   cloNIDine   0.3 mg Oral Q8H   darbepoetin (ARANESP ) injection - DIALYSIS  150 mcg Subcutaneous Q Mon-1800   divalproex   500 mg Oral Daily   doxazosin   4 mg Oral Daily   heparin   5,000 Units Subcutaneous Q8H   hydrALAZINE   100 mg Oral TID   isosorbide  mononitrate  120 mg Oral Daily   losartan   100 mg Oral QPM   pantoprazole   40 mg Oral BID   sevelamer  carbonate  1,600 mg Oral TID WC   sodium zirconium cyclosilicate   10 g Oral Q M,W,F   spironolactone   25 mg Oral Daily   Continuous Infusions:   LOS: 10 days   35 minutes with more than 50%  spent in reviewing records, counseling patient/family and coordinating care.  Edward VEAR Gaw, MD [facility] [email or web address] 01/30/2024, 3:54 PM

## 2024-01-30 NOTE — Progress Notes (Signed)
 Pt back on unit from dialysis. RN called dialysis RN about high blood pressure while at procedure. BP was 204/119. Hemodialysis RN stated he notified the provider and administered PRN hydralazine . BP currently 227/114. Pt grimacing and complaining of chest pain. Will get EKG. RN called rapid response to come up and assess. RN will give all scheduled bp medications and nitroglycerin  for chest pain. RN turned oxygen up to 3L. MD, Elpidio, notified.

## 2024-01-30 NOTE — Progress Notes (Signed)
 PT Cancellation Note  Patient Details Name: [name] MRN: [id number] DOB: 07/20/1985   Cancelled Treatment:    Reason Eval/Treat Not Completed: Patient at procedure or test/unavailable (Pt off unit at HD. Acute PT to re-attempt as schedule allows.)  Mickala Laton W, PT, DPT Secure Chat [facility]erred  [facility] [phone]  [name] [name] 01/30/2024, 10:57 AM

## 2024-01-31 DIAGNOSIS — R079 Chest pain, unspecified: Secondary | ICD-10-CM | POA: Diagnosis not present

## 2024-01-31 DIAGNOSIS — I169 Hypertensive crisis, unspecified: Secondary | ICD-10-CM | POA: Diagnosis not present

## 2024-01-31 LAB — CBC
HCT: 26.5 % — ABNORMAL LOW (ref 39.0–52.0)
Hemoglobin: 8.2 g/dL — ABNORMAL LOW (ref 13.0–17.0)
MCH: 27.9 pg (ref 26.0–34.0)
MCHC: 30.9 g/dL (ref 30.0–36.0)
MCV: 90.1 fL (ref 80.0–100.0)
Platelets: 206 K/uL (ref 150–400)
RBC: 2.94 MIL/uL — ABNORMAL LOW (ref 4.22–5.81)
RDW: 17.2 % — ABNORMAL HIGH (ref 11.5–15.5)
WBC: 4.5 K/uL (ref 4.0–10.5)
nRBC: 0 % (ref 0.0–0.2)

## 2024-01-31 LAB — RENAL FUNCTION PANEL
Albumin: 2.9 g/dL — ABNORMAL LOW (ref 3.5–5.0)
Anion gap: 13 (ref 5–15)
BUN: 44 mg/dL — ABNORMAL HIGH (ref 6–20)
CO2: 26 mmol/L (ref 22–32)
Calcium: 8.9 mg/dL (ref 8.9–10.3)
Chloride: 95 mmol/L — ABNORMAL LOW (ref 98–111)
Creatinine, Ser: 8.57 mg/dL — ABNORMAL HIGH (ref 0.61–1.24)
GFR, Estimated: 8 mL/min — ABNORMAL LOW (ref >60)
Glucose, Bld: 96 mg/dL (ref 70–99)
Phosphorus: 4.8 mg/dL — ABNORMAL HIGH (ref 2.5–4.6)
Potassium: 4.2 mmol/L (ref 3.5–5.1)
Sodium: 134 mmol/L — ABNORMAL LOW (ref 135–145)

## 2024-01-31 LAB — ALDOSTERONE + RENIN ACTIVITY W/ RATIO
ALDO / PRA Ratio: 4.1 (ref 0.0–30.0)
Aldosterone: 2.6 ng/dL (ref 0.0–30.0)
PRA LC/MS/MS: 0.628 ng/mL/h (ref 0.167–5.380)

## 2024-01-31 LAB — GLUCOSE, CAPILLARY: Glucose-Capillary: 97 mg/dL (ref 70–99)

## 2024-01-31 MED ORDER — DOXAZOSIN MESYLATE 8 MG PO TABS
8.0000 mg | ORAL_TABLET | Freq: Every day | ORAL | Status: DC
  Filled 2024-01-31: qty 1

## 2024-01-31 MED ORDER — DIPHENHYDRAMINE HCL 50 MG/ML IJ SOLN
50.0000 mg | Freq: Once | INTRAMUSCULAR | Status: AC
  Administered 2024-02-01: 50 mg via INTRAVENOUS

## 2024-01-31 MED ORDER — CHLORHEXIDINE GLUCONATE CLOTH 2 % EX PADS
6.0000 | MEDICATED_PAD | Freq: Every day | CUTANEOUS | Status: DC
  Administered 2024-02-01 – 2024-02-10 (×10): 6 via TOPICAL

## 2024-01-31 MED ORDER — LOSARTAN POTASSIUM 50 MG PO TABS
100.0000 mg | ORAL_TABLET | Freq: Every evening | ORAL | Status: DC
  Administered 2024-01-31 – 2024-02-09 (×10): 100 mg via ORAL
  Filled 2024-01-31 (×9): qty 2

## 2024-01-31 MED ORDER — NYSTATIN 100000 UNIT/GM EX POWD
Freq: Three times a day (TID) | CUTANEOUS | Status: DC
  Administered 2024-01-31: 1 via TOPICAL
  Filled 2024-01-31: qty 15

## 2024-01-31 NOTE — Progress Notes (Addendum)
 PROGRESS NOTE  [name]  FMW:[id number] DOB: February 17, 1986 DOA: 01/20/2024 PCP: Lonnie Earnest, MD  Consultants  Brief Narrative: 38 year old man with PMHx significant for HTN (difficult to control), HFpEF (Echo 12/2023 with EF 55-60%, severe concentric LVH, G2DD, small to moderate pericardial effusion), Alport syndrome, ESRD on HD (MWFS via RIJ TDC), GERD, anemia of chronic disease, bipolar 1 disorder, hearing loss, depression, tobacco use. Recent admission 8/16 - 8/18 for malaise/body aches/GI symptoms/dizziness, presented to [facility] ED 9/10 for weakness, chills, body aches, fatigue, facial numbness x 2 weeks. Last HD 9/8. Patient states that he recently had COVID a few weeks ago and was recovering from that when he began to feel poorly again. Was tested for COVID again at HD and reportedly negative. Has had rigor-like shaking chills during HD and feels like he cannot get warm (notes that his house thermostat is set at 84 degrees post-treatment). He reports fever/chills, no CP/SOB, found to be COVID+ with hypertensive urgency.   En route with EMS, HR 86, BP 233/139, RR 86, SpO2 99% on RA. On ED arrival, patient was afebrile with HR 90s, BP 225/128, RR 21, SpO2 100%. Labs were notable for WBC 7.1, Hgb 8.3, Plt 255. Na 135, K 4.6, CO2 22, BUN/Cr 54/16.10. AST/ALT WNL, Alk Phos 139, Tbili 0.9. Trop 24. BNP 1736.2. CT Head NAICA. Labetalol  10mg  IV x 1 given without improvement in BP. Cardene  gtt was initiated.  Admitted under PCCM, eventually Cardene  stopped and he was transferred to [facility] on 01/23/2024.  Despite this, BPs have remained elevated throughout entire admission.   Assessment & Plan: Hypertensive crisis History of HTN, refractory/difficult to control c/b poor compliance/admitted with hypertensive crisis, POA: - aldosterone-renin panel pending.  Initially needed Cardene  which was weaned off.  When patient was transitioned under [facility], patient was on home dose of nifedipine  90 mg XL, hydralazine  100  mg 3 times daily, labetalol  200 mg twice daily, losartan  100 mg daily and yet blood pressure was significantly uncontrolled with systolic in 200 range and diastolic in 100 range.  Transitioned his nifedipine  to amlodipine  and started him on Imdur  30 mg on 01/23/2023.  Imdur  eventually escalated to maximum dose of 120 mg due to uncontrolled blood pressure and then clonidine  0.3 mg 3 times daily was added.  -  Patient's blood pressure was finally somewhat controlled however patient declined taking labetalol  stating that labetalol  caused him to fall asleep and he becomes lethargic and does not feel well.   - Per nephrology, he had tolerated low-dose of Coreg  before.  They talked to him started him on Coreg  12.5 mg p.o. twice daily.  At this point in time, patient is maxed out on all the antihypertensive medications.  Nephrology knows him very well and per them, blood pressure 180/105 or less will be okay for him to go home.   - on review of his recent [facility] admission, doxazosin  eventually added to regimen and he was DC'ed on that.  Unclear why stopped, will restart here-->discussed with patient today, reports it was stopped due to dizziness.  He declines it here b/c of this, will stop as it wasn't really helping with his .  -Hopeful that persistent/continued dialysis will help with his blood pressure.  We have had to stop early due to reactions for the past several times.  See below for ESRD and possibility of peritoneal dialysis.   Chest pain:  - No further chest pain or pressure. - Troponin was negative. - EKG nonconcerning.-Greatly appreciate cardiology input.  Cardiology has now signed off.  Will monitor for any recurrence of chest pain.  ESRD on HD (MWFS via RIJ TDC) Alport syndrome LUE AVG dysfunction - There have been multiple issues with reactions to the dialysis process.  Nephrology working closely with patient but has had ongoing reactions. - Reactions difficult to control. - Vascular  surgery consulted for possible peritoneal dialysis.  We are starting this process now. - Nephrology is reaching out to peritoneal dialysis nurses to evaluate his home to see if he continues to qualify.  Groin itching: - with some itching in groin that started this afternoon.   - with some overlying erythema and skin excoriations.   - will go ahead and give hydroxyzine  for itching and added nystatin  powder to his regimen.   HFpEF / BLE edema Echo 12/2023 with EF 55-60%, severe concentric LVH, G2DD, small to moderate pericardial effusion. Optimize electrolytes for K > 4, Mg > 2.  Volume removal by HD - Echo reviewed from yesterday, being performed this afternoon.  Hard of hearing: - secondary to Alport syndrome above.  Sometimes requires writing down questions to answer better   Diarrhea, acute-on-chronic Increased frequency of diarrhea, per patient.   Improved now, monitor.    Anemia of chronic disease - Trend H&H - Monitor for signs of active bleeding - Transfuse for Hgb < 7.0 or hemodynamically significant bleeding.  Hemoglobin labile, currently 8.5.  Likely secondary to ESRD   GERD - PPI   Bipolar 1 disorder Depression - Continue home Depakote    Tobacco use - Encourage cessation - Nicotine  patch.      DVT prophylaxis:  heparin  injection 5,000 Units Start: 01/21/24 0600 SCDs Start: 01/20/24 2230 Family communication: Aunt/uncle at bedside plus another aunt on the phone. Code Status:   Code Status: Full Code Level of care: Progressive Status is: Inpatient   Consults called: Nephrology, Cardiology  Subjective: Awake and alert this morning.  Lots of questions about peritoneal dialysis.  No further chest pain overnight or today.  Eating and drinking well.  Objective: Vitals:   01/31/24 0806 01/31/24 1145 01/31/24 1300 01/31/24 1315  BP:  (!) 200/101 (!) 200/105 (!) 190/103  Pulse:  74    Resp: 18 15 14 14   Temp:  98.1 F (36.7 C)    TempSrc:  Oral    SpO2:  100%     Weight:      Height:        Intake/Output Summary (Last 24 hours) at 01/31/2024 1525 Last data filed at 01/31/2024 0739 Gross per 24 hour  Intake 726 ml  Output --  Net 726 ml   Filed Weights   01/30/24 0854 01/30/24 1400 01/31/24 0621  Weight: 80.4 kg 74.4 kg 75.9 kg   Body mass index is 25.45 kg/m.  Gen: 38 y.o. male in no apparent distress.  Nontoxic, but looks uncomfortable Pulm: Non-labored breathing.  Clear to auscultation bilaterally.  CV: Regular rate and rhythm.  GI: Abdomen soft, non-tender, non-distended, with normoactive bowel sounds. No organomegaly or masses felt. Ext: Warm, no deformities Skin: No rashes, lesions noulcers Neuro: Alert and oriented. No focal neurological deficits. Psych: Calm  Judgement and insight appear normal. Mood & affect appropriate.     I have personally reviewed the following labs and images: CBC: Recent Labs  Lab 01/27/24 0356 01/28/24 0322 01/29/24 0339 01/30/24 0351 01/31/24 0229  WBC 5.3 7.4 8.7 6.0 4.5  HGB 8.1* 8.5* 8.5* 8.1* 8.2*  HCT 25.2* 26.8* 27.2* 26.0* 26.5*  MCV  88.4 89.0 90.1 89.0 90.1  PLT 168 199 196 163 206   BMP &GFR Recent Labs  Lab 01/26/24 0506 01/27/24 0356 01/29/24 0747 01/30/24 0351 01/31/24 0229  NA 129* 127* 128* 131* 134*  K 5.0 5.5* 6.0* 4.8 4.2  CL 91* 92* 92* 91* 95*  CO2 24 22 22 24 26   GLUCOSE 85 87 82 91 96  BUN 47* 63* 74* 54* 44*  CREATININE 10.55* 13.03* 13.44* 10.26* 8.57*  CALCIUM  8.9 8.9 9.1 8.9 8.9  PHOS  --   --  5.6* 5.1* 4.8*   Estimated Creatinine Clearance: 11.5 mL/min (A) (by C-G formula based on SCr of 8.57 mg/dL (H)). Liver & Pancreas: Recent Labs  Lab 01/29/24 0747 01/30/24 0351 01/31/24 0229  ALBUMIN  3.0* 3.0* 2.9*   No results for input(s): LIPASE, AMYLASE in the last 168 hours. No results for input(s): AMMONIA in the last 168 hours. Diabetic: No results for input(s): HGBA1C in the last 72 hours. Recent Labs  Lab 01/26/24 1205 01/28/24 1444  01/29/24 1728 01/29/24 2321 01/31/24 0432  GLUCAP 74 110* 111* 109* 97   Cardiac Enzymes: No results for input(s): CKTOTAL, CKMB, CKMBINDEX, TROPONINI in the last 168 hours. No results for input(s): PROBNP in the last 8760 hours. Coagulation Profile: No results for input(s): INR, PROTIME in the last 168 hours. Thyroid Function Tests: No results for input(s): TSH, T4TOTAL, FREET4, T3FREE, THYROIDAB in the last 72 hours. Lipid Profile: No results for input(s): CHOL, HDL, LDLCALC, TRIG, CHOLHDL, LDLDIRECT in the last 72 hours. Anemia Panel: No results for input(s): VITAMINB12, FOLATE, FERRITIN, TIBC, IRON , RETICCTPCT in the last 72 hours. Urine analysis:    Component Value Date/Time   COLORURINE STRAW (A) 06/07/2021 1758   APPEARANCEUR CLEAR 06/07/2021 1758   LABSPEC 1.008 06/07/2021 1758   PHURINE 6.0 06/07/2021 1758   GLUCOSEU 50 (A) 06/07/2021 1758   HGBUR SMALL (A) 06/07/2021 1758   BILIRUBINUR NEGATIVE 06/07/2021 1758   KETONESUR NEGATIVE 06/07/2021 1758   PROTEINUR >=300 (A) 06/07/2021 1758   UROBILINOGEN 0.2 05/29/2013 1121   NITRITE NEGATIVE 06/07/2021 1758   LEUKOCYTESUR NEGATIVE 06/07/2021 1758   Sepsis Labs: Invalid input(s): PROCALCITONIN, LACTICIDVEN  Microbiology: No results found for this or any previous visit (from the past 240 hours).   Radiology Studies: ECHOCARDIOGRAM LIMITED Result Date: 01/30/2024    ECHOCARDIOGRAM LIMITED REPORT   Patient Name:   LEONELL LOBDELL Date of Exam: 01/30/2024 Medical Rec #:  [id number]       Height:       68.0 in Accession #:    [id number]      Weight:       177.2 lb Date of Birth:  27-Mar-1986       BSA:          1.941 m Patient Age:    38 years        BP:           204/119 mmHg Patient Gender: M               HR:           74 bpm. Exam Location:  Inpatient Procedure: Limited Echo (Both Spectral and Color Flow Doppler were utilized            during procedure). Indications:     pericardial effusion  History:        Patient has prior history of Echocardiogram examinations, most  recent 12/28/2023. End stage renal disease; Risk Factors:Current                 Smoker and Hypertension.  Sonographer:    Tinnie Barefoot RDCS Referring Phys: [id number] Zaccheaus Storlie H Veora Fonte IMPRESSIONS Limited echo for pericardial effusion  1. Left ventricular ejection fraction, by estimation, is 60 to 65%. Left ventricular ejection fraction by PLAX is 65 %. The left ventricle has normal function. The left ventricle has no regional wall motion abnormalities. There is moderate left ventricular hypertrophy.  2. There is no evidence of cardiac tamponade.  3. The aortic valve is tricuspid. Aortic valve regurgitation is not visualized.  4. Moderately dilated pulmonary artery.  5. There is normal pulmonary artery systolic pressure. The estimated right ventricular systolic pressure is 28.8 mmHg.  6. The inferior vena cava is normal in size with greater than 50% respiratory variability, suggesting right atrial pressure of 3 mmHg.  7. Trivial circumferential pericardial effusion. Comparison(s): Changes from prior study are noted. 12/28/2023: LVEF 55-60%, severe LVH, small pericardial effusion near the RV. Compared to this prior study, the pericardial effusion is smaller (now trivial in size). FINDINGS  Left Ventricle: Left ventricular ejection fraction, by estimation, is 60 to 65%. Left ventricular ejection fraction by PLAX is 65 %. The left ventricle has normal function. The left ventricle has no regional wall motion abnormalities. The left ventricular internal cavity size was normal in size. There is moderate left ventricular hypertrophy. Right Ventricle: There is normal pulmonary artery systolic pressure. The tricuspid regurgitant velocity is 2.54 m/s, and with an assumed right atrial pressure of 3 mmHg, the estimated right ventricular systolic pressure is 28.8 mmHg. Pericardium: Trivial pericardial effusion is  present. The pericardial effusion is circumferential. There is no evidence of cardiac tamponade. Tricuspid Valve: The tricuspid valve is grossly normal. Tricuspid valve regurgitation is mild. Aortic Valve: The aortic valve is tricuspid. Aortic valve regurgitation is not visualized. Pulmonary Artery: The pulmonary artery is moderately dilated. Venous: The inferior vena cava is normal in size with greater than 50% respiratory variability, suggesting right atrial pressure of 3 mmHg. Additional Comments: Spectral Doppler performed. Color Doppler performed.  LEFT VENTRICLE PLAX 2D LV EF:         Left ventricular ejection fraction by PLAX is 65 %. LVIDd:         5.00 cm LVIDs:         3.20 cm LV PW:         1.40 cm LV IVS:        1.30 cm  IVC IVC diam: 1.90 cm LEFT ATRIUM         Index LA diam:    4.20 cm 2.16 cm/m  AORTIC VALVE LVOT Vmax:   135.00 cm/s LVOT Vmean:  91.300 cm/s LVOT VTI:    0.246 m MITRAL VALVE                TRICUSPID VALVE MV Area (PHT): 3.91 cm     TR Peak grad:   25.8 mmHg MV Decel Time: 194 msec     TR Vmax:        254.00 cm/s MV E velocity: 106.00 cm/s MV A velocity: 44.00 cm/s   SHUNTS MV E/A ratio:  2.41         Systemic VTI: 0.25 m Vinie Maxcy MD Electronically signed by Vinie Maxcy MD Signature Date/Time: 01/30/2024/4:42:25 PM    Final     Scheduled Meds:  amLODipine   10 mg Oral Daily  carvedilol   12.5 mg Oral BID   Chlorhexidine  Gluconate Cloth  6 each Topical Q0600   Chlorhexidine  Gluconate Cloth  6 each Topical Q0600   Chlorhexidine  Gluconate Cloth  6 each Topical Q0600   [START ON 02/01/2024] Chlorhexidine  Gluconate Cloth  6 each Topical Q0600   [START ON 02/01/2024] cinacalcet   30 mg Oral Q M,W,F-1800   cloNIDine   0.3 mg Oral Q8H   darbepoetin (ARANESP ) injection - DIALYSIS  150 mcg Subcutaneous Q Mon-1800   [START ON 02/01/2024] diphenhydrAMINE   50 mg Intravenous Once in dialysis   divalproex   500 mg Oral Daily   doxazosin   8 mg Oral Daily   heparin   5,000 Units  Subcutaneous Q8H   hydrALAZINE   100 mg Oral TID   isosorbide  mononitrate  120 mg Oral Daily   losartan   100 mg Oral QPM   pantoprazole   40 mg Oral BID   sevelamer  carbonate  1,600 mg Oral TID WC   sodium zirconium cyclosilicate   10 g Oral Q M,W,F   spironolactone   25 mg Oral Daily   Continuous Infusions:   LOS: 11 days   35 minutes with more than 50% spent in reviewing records, counseling patient/family and coordinating care.  Reyes VEAR Gaw, MD [facility] [email or web address] 01/31/2024, 3:25 PM

## 2024-01-31 NOTE — Consult Note (Signed)
 Hospital Consult    Reason for Consult: End-stage renal disease, peritoneal dialysis catheter evaluation Referring Physician: Dr. Geralynn MRN #:  [id number]  History of Present Illness:  38 y.o. male history of end-stage renal disease currently dialyzing via right IJ tunneled catheter.  He has a left upper arm AV graft placed in July at [facility] that there has been concern for pulling clots.  He underwent shuntogram last week that demonstrated a short appearing graft but no underlying abnormalities were noted.  There is no concern for dialysis access allergic reaction.  We are being asked to evaluate him for peritoneal dialysis catheter placement.  He has never been on peritoneal dialysis before.  He denies previous abdominal surgeries but did have heart surgery in the epigastric region.  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   Heart murmur    Hypertension    Low blood sugar    Marijuana abuse    Noncompliance    Pericardial effusion 03/28/2023   Tobacco abuse     Past Surgical History:  Procedure Laterality Date   A/V FISTULAGRAM Right 10/27/2022   Procedure: A/V Fistulagram;  Surgeon: Eliza Lonni RAMAN, MD;  Location: [facility] INVASIVE CV LAB;  Service: Cardiovascular;  Laterality: Right;   A/V FISTULAGRAM Left 01/27/2024   Procedure: A/V Fistulagram;  Surgeon: Lanis Fonda BRAVO, MD;  Location: [facility] INVASIVE CV LAB;  Service: Cardiovascular;  Laterality: Left;   APPENDECTOMY     AV FISTULA PLACEMENT Left 03/03/2019   Procedure: ARTERIOVENOUS (AV) FISTULA CREATION LEFT ARM;  Surgeon: Sheree Penne Lonni, MD;  Location: [facility] OR;  Service: Vascular;   Laterality: Left;   BASCILIC VEIN TRANSPOSITION Right 04/12/2019   Procedure: RIGHT UPPER EXTREMITY BASCILIC VEIN TRANSPOSITION FIRST STAGE FISTULA;  Surgeon: Eliza Lonni RAMAN, MD;  Location: [facility] OR;  Service: Vascular;  Laterality: Right;   BIOPSY  10/14/2021   Procedure: BIOPSY;  Surgeon: Legrand Victory LITTIE DOUGLAS, MD;  Location: [facility] ENDOSCOPY;  Service: Gastroenterology;;   BIOPSY  06/06/2022   Procedure: BIOPSY;  Surgeon: Shila Gustav GAILS, MD;  Location: [facility] ENDOSCOPY;  Service: Gastroenterology;;   BIOPSY  11/12/2022   Procedure: BIOPSY;  Surgeon: Wilhelmenia Aloha Raddle., MD;  Location: [facility] ENDOSCOPY;  Service: Gastroenterology;;   BIOPSY  12/05/2022   Procedure: BIOPSY;  Surgeon: Stacia Glendia BRAVO, MD;  Location: [facility] ENDOSCOPY;  Service: Gastroenterology;;   COLONOSCOPY WITH PROPOFOL  N/A 10/14/2021   Procedure: COLONOSCOPY WITH PROPOFOL ;  Surgeon: Legrand Victory LITTIE DOUGLAS, MD;  Location: [facility] ENDOSCOPY;  Service: Gastroenterology;  Laterality: N/A;   DIALYSIS/PERMA CATHETER INSERTION N/A 04/03/2023   Procedure: DIALYSIS/PERMA CATHETER INSERTION;  Surgeon: Magda Debby SAILOR, MD;  Location: [facility] INVASIVE CV LAB;  Service: Cardiovascular;  Laterality: N/A;   ESOPHAGOGASTRODUODENOSCOPY N/A 11/12/2022   Procedure: ESOPHAGOGASTRODUODENOSCOPY (EGD);  Surgeon: Wilhelmenia Aloha Raddle., MD;  Location: [facility] ENDOSCOPY;  Service: Gastroenterology;  Laterality: N/A;   ESOPHAGOGASTRODUODENOSCOPY N/A 12/05/2022   Procedure: ESOPHAGOGASTRODUODENOSCOPY (EGD);  Surgeon: Stacia Glendia BRAVO, MD;  Location: [facility] ENDOSCOPY;  Service: Gastroenterology;  Laterality: N/A;   ESOPHAGOGASTRODUODENOSCOPY (EGD) WITH PROPOFOL  N/A 06/06/2022  Procedure: ESOPHAGOGASTRODUODENOSCOPY (EGD) WITH PROPOFOL ;  Surgeon: Shila Gustav GAILS, MD;  Location: [facility] ENDOSCOPY;  Service: Gastroenterology;  Laterality: N/A;   FISTULA SUPERFICIALIZATION Right 11/07/2022   Procedure: PLICATION OF LARGE ANEURYSMS OF RIGHT ARTERIOVENOUS FISTULA WITH INSERTION OF 7mm  INTERPOSITIONAL GORTEX GRAFT;  Surgeon: Eliza Lonni RAMAN, MD;  Location: [facility] OR;  Service: Vascular;  Laterality: Right;   FLEXIBLE SIGMOIDOSCOPY N/A 04/14/2022   Procedure: FLEXIBLE SIGMOIDOSCOPY;  Surgeon: Shila Gustav GAILS, MD;  Location: [facility] ENDOSCOPY;  Service: Gastroenterology;  Laterality: N/A;   INSERTION OF DIALYSIS CATHETER N/A 11/07/2022   Procedure: INSERTION OF RIGHT INTERNAL JUGULAR TUNNELED DIALYSIS CATHETER;  Surgeon: Eliza Lonni RAMAN, MD;  Location: [facility] OR;  Service: Vascular;  Laterality: N/A;   LIGATION OF ARTERIOVENOUS  FISTULA Left 03/06/2019   Procedure: LIGATION OF ARTERIOVENOUS  FISTULA;  Surgeon: Gretta Lonni PARAS, MD;  Location: [facility] OR;  Service: Vascular;  Laterality: Left;   PERIPHERAL VASCULAR BALLOON ANGIOPLASTY  10/27/2022   Procedure: PERIPHERAL VASCULAR BALLOON ANGIOPLASTY;  Surgeon: Eliza Lonni RAMAN, MD;  Location: [facility] INVASIVE CV LAB;  Service: Cardiovascular;;  rt upper arm fistula   SAVORY DILATION N/A 11/12/2022   Procedure: SAVORY DILATION;  Surgeon: Wilhelmenia Aloha Raddle., MD;  Location: [facility] ENDOSCOPY;  Service: Gastroenterology;  Laterality: N/A;   spinal tap     SPINE SURGERY     related to a spinal infection, unsure of surgery or infection source   TRANSESOPHAGEAL ECHOCARDIOGRAM (CATH LAB) N/A 04/02/2023   Procedure: TRANSESOPHAGEAL ECHOCARDIOGRAM;  Surgeon: Jeffrie Oneil BROCKS, MD;  Location: [facility] INVASIVE CV LAB;  Service: Cardiovascular;  Laterality: N/A;   WISDOM TOOTH EXTRACTION      Allergies  Allergen Reactions   Nsaids Other (See Comments)    Contraindication due to ckd    Zestril [Lisinopril] Swelling   Risperdal  [Risperidone ] Other (See Comments)    Unknown reaction     Prior to Admission medications   Medication Sig Start Date End Date Taking? Authorizing Provider  divalproex  (DEPAKOTE  ER) 500 MG 24 hr tablet Take 1 tablet (500 mg total) by mouth daily. 04/04/23  Yes Joshua Domino, DO  hydrALAZINE  (APRESOLINE ) 100 MG tablet  Take 1 tablet (100 mg total) by mouth 3 (three) times daily. 04/04/23  Yes Joshua Domino, DO  losartan  (COZAAR ) 100 MG tablet Take 100 mg by mouth daily. 11/11/23  Yes [provider]  NIFEdipine  (ADALAT  CC) 90 MG 24 hr tablet Take 90 mg by mouth in the morning and at bedtime.   Yes [provider]  pantoprazole  (PROTONIX ) 40 MG tablet Take 1 tablet (40 mg total) by mouth daily. 12/28/23  Yes Jenna Maude BRAVO, NP  sevelamer  carbonate (RENVELA ) 800 MG tablet Take 2 tablets (1,600 mg total) by mouth 3 (three) times daily with meals. 05/14/23  Yes Cleotilde Lukes, DO  carvedilol  (COREG ) 25 MG tablet Take 1 tablet (25 mg total) by mouth 2 (two) times daily with a meal. Patient not taking: Reported on 01/21/2024 12/28/23   Jenna Maude BRAVO, NP  doxazosin  (CARDURA ) 8 MG tablet Take 1 tablet by mouth daily. Patient not taking: Reported on 01/21/2024 12/04/23   [provider]  gabapentin  (NEURONTIN ) 100 MG capsule Please take 100 mg after dialysis on Tuesday, Thursday and Saturday Patient not taking: Reported on 01/21/2024 05/14/23   Cleotilde Lukes, DO  hydrOXYzine  (ATARAX ) 10 MG tablet Take 0.5 tablets (5 mg total) by mouth 2 (two) times daily as needed for itching. Patient not taking: Reported on 01/21/2024 07/21/23   Joshua,  Lauraine, DO    Social History   Socioeconomic History   Marital status: Married    Spouse name: Not on file   Number of children: 3   Years of education: Not on file   Highest education level: Not on file  Occupational History   Occupation: HVAC  Tobacco Use   Smoking status: Every Day    Current packs/day: 0.00    Types: Cigarettes    Last attempt to quit: 02/27/2016    Years since quitting: 7.9   Smokeless tobacco: Never   Tobacco comments:    Returned to smoking 2 black and milds per day  Vaping Use   Vaping status: Never Used  Substance and Sexual Activity   Alcohol use: Not Currently    Comment: rare   Drug use: Yes    Types: Marijuana    Sexual activity: Not Currently  Other Topics Concern   Not on file  Social History Narrative   Not on file   Social Drivers of Health   Financial Resource Strain: Not on file  Food Insecurity: No Food Insecurity (01/21/2024)   Hunger Vital Sign    Worried About Running Out of Food in the Last Year: Never true    Ran Out of Food in the Last Year: Never true  Transportation Needs: No Transportation Needs (01/21/2024)   PRAPARE - Administrator, Civil Service (Medical): No    Lack of Transportation (Non-Medical): No  Physical Activity: Inactive (06/02/2023)   Exercise Vital Sign    Days of Exercise per Week: 0 days    Minutes of Exercise per Session: 0 min  Stress: Stress Concern Present (06/02/2023)   [employer] of Occupational Health - Occupational Stress Questionnaire    Feeling of Stress : Rather much  Social Connections: Unknown (09/20/2021)   [facility] from [facility]   Social Network    Social Network: Not on file  Intimate Partner Violence: Not At Risk (01/21/2024)   Humiliation, Afraid, Rape, and Kick questionnaire    Fear of Current or Ex-Partner: No    Emotionally Abused: No    Physically Abused: No    Sexually Abused: No     Family History  Problem Relation Age of Onset   Hypertension Mother    Kidney failure Mother    Diabetes Mother    Hearing loss Father    Hypertension Sister    Heart disease Maternal Grandmother    Stroke Maternal Grandfather    Asthma Son     Review of Systems  Constitutional: Negative.   HENT:  Positive for hearing loss.   Eyes: Negative.   Respiratory: Negative.    Cardiovascular:  Positive for chest pain.  Gastrointestinal: Negative.   Musculoskeletal: Negative.   Neurological: Negative.   Endo/Heme/Allergies: Negative.   Psychiatric/Behavioral: Negative.        Physical Examination  Vitals:   01/31/24 0733 01/31/24 0806  BP: (!) 213/103   Pulse: 74   Resp: 15 18  Temp: (!) 97.5 F (36.4 C)    SpO2: 100%    Body mass index is 25.45 kg/m.  Physical Exam Constitutional:      Appearance: Normal appearance.  HENT:     Head: Normocephalic.     Nose: Nose normal.  Eyes:     Pupils: Pupils are equal, round, and reactive to light.  Neck:     Comments: Right IJ tunnel catheter in place without evidence of infection Cardiovascular:     Pulses: Normal  pulses.  Pulmonary:     Effort: Pulmonary effort is normal.  Abdominal:     General: Abdomen is flat.  Musculoskeletal:     Comments: Left upper extremity graft with thrill, small edematous area near the antecubitum without ecchymosis  Skin:    Capillary Refill: Capillary refill takes less than 2 seconds.  Neurological:     General: No focal deficit present.     Mental Status: [name]. Mental status is at baseline.  Psychiatric:        Mood and Affect: Mood normal.      CBC    Component Value Date/Time   WBC 4.5 01/31/2024 0229   RBC 2.94 (L) 01/31/2024 0229   HGB 8.2 (L) 01/31/2024 0229   HGB 10.4 (L) 03/17/2022 1721   HCT 26.5 (L) 01/31/2024 0229   HCT 30.2 (L) 03/17/2022 1721   PLT 206 01/31/2024 0229   PLT 207 03/17/2022 1721   MCV 90.1 01/31/2024 0229   MCV 87 03/17/2022 1721   MCH 27.9 01/31/2024 0229   MCHC 30.9 01/31/2024 0229   RDW 17.2 (H) 01/31/2024 0229   RDW 14.0 03/17/2022 1721   LYMPHSABS 1.2 01/20/2024 2100   MONOABS 0.6 01/20/2024 2100   EOSABS 0.2 01/20/2024 2100   BASOSABS 0.0 01/20/2024 2100    BMET    Component Value Date/Time   NA 134 (L) 01/31/2024 0229   NA 139 06/16/2022 1606   K 4.2 01/31/2024 0229   CL 95 (L) 01/31/2024 0229   CO2 26 01/31/2024 0229   GLUCOSE 96 01/31/2024 0229   BUN 44 (H) 01/31/2024 0229   BUN 72 (H) 06/16/2022 1606   CREATININE 8.57 (H) 01/31/2024 0229   CALCIUM  8.9 01/31/2024 0229   GFRNONAA 8 (L) 01/31/2024 0229   GFRAA 6 (L) 10/26/2019 1032    COAGS: Lab Results  Component Value Date   INR 1.2 03/27/2023     Non-Invasive Vascular  Imaging:   No new studies   ASSESSMENT/PLAN: This is a 38 y.o. male with end-stage renal disease currently dialyzing via tunneled dialysis catheter.  There is concern for allergic reaction we are being asked to evaluate for PD catheter.  I discussed this with the patient and his aunt who was on the phone he should be a suitable candidate for PD catheter should he qualify.  Please contact us  if he is approved and we can schedule him later this week.  Seung Nidiffer C. Sheree, MD [facility] Office: [phone] Pager: [phone]

## 2024-01-31 NOTE — Progress Notes (Signed)
 [facility] Progress Note   Subjective: Seen in room. Dialysis yesterday using Nipro Cellentia dialyzer with pre rinse. He was also premedicated with benadryl . Despite this he had another episode of rigors and chest pain towards the end of dialysis yesterday. Chest pain is intermittent but worsens with dialysis. Of note high UF goal yesterday with 6 L removed.   The concern is that he may not be able to tolerate hemodialysis. We had discussion with Dr. Geralynn today about possible transitioning to peritoneal dialysis.   Objective Vitals:   01/31/24 0621 01/31/24 0733 01/31/24 0806 01/31/24 1145  BP:  (!) 213/103  (!) 200/101  Pulse:  74  74  Resp: 18 15 18 15   Temp:  (!) 97.5 F (36.4 C)  98.1 F (36.7 C)  TempSrc:  Axillary  Oral  SpO2:  100%  100%  Weight: 75.9 kg     Height:         Additional Objective Labs: Basic Metabolic Panel: Recent Labs  Lab 01/29/24 0747 01/30/24 0351 01/31/24 0229  NA 128* 131* 134*  K 6.0* 4.8 4.2  CL 92* 91* 95*  CO2 22 24 26   GLUCOSE 82 91 96  BUN 74* 54* 44*  CREATININE 13.44* 10.26* 8.57*  CALCIUM  9.1 8.9 8.9  PHOS 5.6* 5.1* 4.8*   CBC: Recent Labs  Lab 01/27/24 0356 01/28/24 0322 01/29/24 0339 01/30/24 0351 01/31/24 0229  WBC 5.3 7.4 8.7 6.0 4.5  HGB 8.1* 8.5* 8.5* 8.1* 8.2*  HCT 25.2* 26.8* 27.2* 26.0* 26.5*  MCV 88.4 89.0 90.1 89.0 90.1  PLT 168 199 196 163 206   Blood Culture    Component Value Date/Time   SDES BLOOD LEFT ANTECUBITAL 01/21/2024 1035   SDES BLOOD BLOOD RIGHT HAND 01/21/2024 1035   SPECREQUEST  01/21/2024 1035    BOTTLES DRAWN AEROBIC AND ANAEROBIC Blood Culture adequate volume   SPECREQUEST  01/21/2024 1035    BOTTLES DRAWN AEROBIC AND ANAEROBIC Blood Culture adequate volume   CULT  01/21/2024 1035    NO GROWTH 5 DAYS Performed at [facility], [address], [place], [place] [address]    CULT  01/21/2024 1035    NO GROWTH 5 DAYS Performed at [facility], [address], [place], [place] [address]    REPTSTATUS 01/26/2024 FINAL 01/21/2024 1035   REPTSTATUS 01/26/2024 FINAL 01/21/2024 1035     Physical Exam General: Alert, nad, on nasal oxygen Heart: RRR Lungs: Coarse breath sounds; normal wob Abdomen: non tender Extremities: no sig LE edema Dialysis Access: TDC; L AVG   Medications:   amLODipine   10 mg Oral Daily   carvedilol   12.5 mg Oral BID   Chlorhexidine  Gluconate Cloth  6 each Topical Q0600   Chlorhexidine  Gluconate Cloth  6 each Topical Q0600   Chlorhexidine  Gluconate Cloth  6 each Topical Q0600   [START ON 02/01/2024] cinacalcet   30 mg Oral Q M,W,F-1800   cloNIDine   0.3 mg Oral Q8H   darbepoetin (ARANESP ) injection - DIALYSIS  150 mcg Subcutaneous Q Mon-1800   divalproex   500 mg Oral Daily   doxazosin   8 mg Oral Daily   heparin   5,000 Units Subcutaneous Q8H   hydrALAZINE   100 mg Oral TID   isosorbide  mononitrate  120 mg Oral Daily   losartan   100 mg Oral QPM   pantoprazole   40 mg Oral BID   sevelamer  carbonate  1,600 mg Oral TID WC   sodium zirconium cyclosilicate   10 g Oral  Q M,W,F   spironolactone   25 mg Oral Daily    Dialysis Orders:  MWF NW  4h  B350   72.8kg  2K bath  Heparin  none  RIJ TDC Mircera 200 mcg q2wks - last 9/8 Calcitriol  0.75mcgmcg PO qHD  Sensipar  30mg  with HD  Assessment/Plan: HTN Emergency: current regimen -->  - amlodipine  10mg  every day - coreg  12.5 bid (refusing high dose d/t severe HA's) - clonidine  0.3 tid - hydralazine  100 tid - labetalol  dc'd (refusing d/t severe side effects) - losartan  100mg  hs - cont to lower vol w/ HD - goal SBP < 180/ DBP < 105 (has hx of chronic uncont BP)    Rigors with dialysis: Patient c/o fevers/chills at his outpatient center after last 2-3 treatments. He's afebrile here, WBC wnl and negative blood cx's.Pt has been having rigors associated w/ HD sessions, this started about 1-2 weeks prior to admission. W/u earlier was negative  for blood infection and this is  likely an allergic reaction to the dialysis filter.  Date     Dialyzer type               Pre HD prep                Symptoms w/ HD 9/11     Reveclear                    None                            +Rigors 9/12     Nipro Cellentia            None                            Nothing 9/15     Reveclear                    2 L pre rinse                Nothing 9/17     Reveclear                    None                            +Rigors 9/19     Nipro Cellentia            None                            +Rigors 9/20     Nipro Cellentia            1.3 L total pre rinse     +Rigors and chest pain                                                  IV benadryl  50mg  pre   Access: Has a R TDC and L AVG. Per OP HD RN pt has been having pain on cannulation, difficult cannulation and sometimes pulling clots. Had fistulogram 9/17 --> showed no stenosis within the AVG or at the outflow tract.  Chest pain - s/p pericardial effusion with  pericardial window in June 2025. Chest pain worsens during dialysis. Cardiology consulted. Unlikely ischemic etiology.  Repeat Echo 01/30/24-- EF 55-60%, severe LVH, small pericardial effusion near the RV. Compared to this prior study, the pericardial effusion is smaller (now trivial in size)  ESRD -  on HD MWF. HD Monday - hypoallergenic dialyzer with pre rinse. Having discussions about transition to peritoneal dialysis. VVS consulted. Will need to contact home therapies on Monday.  Volume - HD for volume removal. UF to dry weight as able.  Anemia of CKD - Hgb 8.1  ESA not due yet. Last dose 9/8 in outpatient, getting darbe 150 micrograms weekly sq 1st dose 9/15.   2HPTH - CorrCa/Phos acceptable. Continue home meds.  Nutrition - K+ levels up, switched back to renal diet    Maisie Ronnald Acosta PA-C [facility] 01/31/2024,1:08 PM

## 2024-01-31 NOTE — Progress Notes (Signed)
  Progress Note  Patient Name: [name] Date of Encounter: 01/31/2024 [facility] Cardiologist: Newman JINNY Lawrence, MD   Interval Summary   Feeling well today without complaint  Vital Signs Vitals:   01/31/24 0335 01/31/24 0621 01/31/24 0733 01/31/24 0806  BP: (!) 212/94  (!) 213/103   Pulse: 74  74   Resp: 15 18 15 18   Temp: 98 F (36.7 C)  (!) 97.5 F (36.4 C)   TempSrc: Oral  Axillary   SpO2: 100%  100%   Weight:  75.9 kg    Height:        Intake/Output Summary (Last 24 hours) at 01/31/2024 1014 Last data filed at 01/31/2024 0739 Gross per 24 hour  Intake 726 ml  Output 6000 ml  Net -5274 ml      01/31/2024    6:21 AM 01/30/2024    2:00 PM 01/30/2024    8:54 AM  Last 3 Weights  Weight (lbs) 167 lb 6.4 oz 164 lb 0.4 oz 177 lb 4 oz  Weight (kg) 75.932 kg 74.4 kg 80.4 kg      Telemetry/ECG  Sinus rhythm- Personally Reviewed  Physical Exam  GEN: No acute distress.   Neck: No JVD Cardiac: RRR, no murmurs, rubs, or gallops.  Respiratory: Clear to auscultation bilaterally. GI: Soft, nontender, non-distended  MS: No edema  Assessment & Plan  1.  Chest pain: Occurred with dialysis.  He has also been having significant issues with elevated blood pressures.  EKG is unchanged.  As his troponin is 18, unlikely ischemia causing his chest pain.  For now, no further workup is necessary.  2.  Hypertension: Plan per primary team and nephrology  3.  End-stage renal disease: Dialysis per nephrology  Cardiology to sign off.  No further workup is necessary.  Usual follow-up in clinic.  For questions or updates, please contact [facility] Please consult [email or web address] for contact info under         Signed, Edy Belt Gladis Norton, MD

## 2024-02-01 DIAGNOSIS — I169 Hypertensive crisis, unspecified: Secondary | ICD-10-CM | POA: Diagnosis not present

## 2024-02-01 LAB — CBC
HCT: 26.5 % — ABNORMAL LOW (ref 39.0–52.0)
Hemoglobin: 8.4 g/dL — ABNORMAL LOW (ref 13.0–17.0)
MCH: 28.1 pg (ref 26.0–34.0)
MCHC: 31.7 g/dL (ref 30.0–36.0)
MCV: 88.6 fL (ref 80.0–100.0)
Platelets: 184 K/uL (ref 150–400)
RBC: 2.99 MIL/uL — ABNORMAL LOW (ref 4.22–5.81)
RDW: 17.2 % — ABNORMAL HIGH (ref 11.5–15.5)
WBC: 6.3 K/uL (ref 4.0–10.5)
nRBC: 0 % (ref 0.0–0.2)

## 2024-02-01 LAB — RENAL FUNCTION PANEL
Albumin: 3 g/dL — ABNORMAL LOW (ref 3.5–5.0)
Anion gap: 14 (ref 5–15)
BUN: 70 mg/dL — ABNORMAL HIGH (ref 6–20)
CO2: 23 mmol/L (ref 22–32)
Calcium: 9.1 mg/dL (ref 8.9–10.3)
Chloride: 93 mmol/L — ABNORMAL LOW (ref 98–111)
Creatinine, Ser: 11.69 mg/dL — ABNORMAL HIGH (ref 0.61–1.24)
GFR, Estimated: 5 mL/min — ABNORMAL LOW (ref >60)
Glucose, Bld: 93 mg/dL (ref 70–99)
Phosphorus: 5.5 mg/dL — ABNORMAL HIGH (ref 2.5–4.6)
Potassium: 4.9 mmol/L (ref 3.5–5.1)
Sodium: 130 mmol/L — ABNORMAL LOW (ref 135–145)

## 2024-02-01 LAB — GLUCOSE, CAPILLARY: Glucose-Capillary: 90 mg/dL (ref 70–99)

## 2024-02-01 MED ORDER — CLOTRIMAZOLE 1 % EX CREA
TOPICAL_CREAM | Freq: Two times a day (BID) | CUTANEOUS | Status: DC
  Administered 2024-02-05: 1 via TOPICAL
  Filled 2024-02-01 (×2): qty 15

## 2024-02-01 MED ORDER — LIDOCAINE HCL (PF) 1 % IJ SOLN: 5.0000 mL | INTRAMUSCULAR | Status: DC | PRN

## 2024-02-01 MED ORDER — HYDROCORTISONE 1 % EX CREA
TOPICAL_CREAM | Freq: Three times a day (TID) | CUTANEOUS | Status: DC
  Administered 2024-02-05: 1 via TOPICAL
  Filled 2024-02-01 (×2): qty 28

## 2024-02-01 MED ORDER — HEPARIN SODIUM (PORCINE) 1000 UNIT/ML DIALYSIS: 1000.0000 [IU] | INTRAMUSCULAR | Status: DC | PRN

## 2024-02-01 MED ORDER — NEPRO/CARBSTEADY PO LIQD: 237.0000 mL | ORAL | Status: DC | PRN

## 2024-02-01 MED ORDER — LIDOCAINE-PRILOCAINE 2.5-2.5 % EX CREA: 1.0000 | TOPICAL_CREAM | CUTANEOUS | Status: DC | PRN

## 2024-02-01 MED ORDER — ALTEPLASE 2 MG IJ SOLR: 2.0000 mg | Freq: Once | INTRAMUSCULAR | Status: DC | PRN

## 2024-02-01 MED ORDER — HEPARIN SODIUM (PORCINE) 1000 UNIT/ML IJ SOLN
INTRAMUSCULAR | Status: AC
  Filled 2024-02-01: qty 5

## 2024-02-01 MED ORDER — PENTAFLUOROPROP-TETRAFLUOROETH EX AERO: 1.0000 | INHALATION_SPRAY | CUTANEOUS | Status: DC | PRN

## 2024-02-01 MED ORDER — ANTICOAGULANT SODIUM CITRATE 4% (200MG/5ML) IV SOLN: 5.0000 mL | Status: DC | PRN

## 2024-02-01 MED ORDER — METOPROLOL TARTRATE 5 MG/5ML IV SOLN
INTRAVENOUS | Status: AC
  Filled 2024-02-01: qty 10

## 2024-02-01 MED ORDER — DIPHENHYDRAMINE HCL 25 MG PO CAPS: 25.0000 mg | ORAL_CAPSULE | Freq: Three times a day (TID) | ORAL | Status: DC | PRN

## 2024-02-01 MED ORDER — DIPHENHYDRAMINE HCL 50 MG/ML IJ SOLN
INTRAMUSCULAR | Status: AC
Start: 2024-02-01 — End: 2024-02-01
  Filled 2024-02-01: qty 1

## 2024-02-01 MED ORDER — METOPROLOL TARTRATE 5 MG/5ML IV SOLN
10.0000 mg | Freq: Once | INTRAVENOUS | Status: AC
  Administered 2024-02-01: 10 mg via INTRAVENOUS

## 2024-02-01 NOTE — Progress Notes (Signed)
  Received patient in bed to unit.   Informed consent signed and in chart.    TX duration:3.5     Transported by  Hand-off given to patient's nurse.    Access used: left CVC Access issues: None Dressing changed   Total UF removed: 4000 Medication(s) given: none Post HD VS: 262/115 Post HD weight: 74 kg  At the end of treatment when rinsing back patient stated that he was having a  Dialyzer  reaction. Rinsed patient back, patient stable with high bp.   No fever noted.   Hunter Hacking LPN [facility]

## 2024-02-01 NOTE — Progress Notes (Signed)
 PROGRESS NOTE  SIDDHANT HASHEMI  FMW:[id number] DOB: 02-14-1986 DOA: 01/20/2024 PCP: Lonnie Earnest, MD  Consultants  Brief Narrative: 38 year old man with PMHx significant for HTN (difficult to control), HFpEF (Echo 12/2023 with EF 55-60%, severe concentric LVH, G2DD, small to moderate pericardial effusion), Alport syndrome, ESRD on HD (MWFS via RIJ TDC), GERD, anemia of chronic disease, bipolar 1 disorder, hearing loss, depression, tobacco use. Recent admission 8/16 - 8/18 for malaise/body aches/GI symptoms/dizziness, presented to [facility] ED 9/10 for weakness, chills, body aches, fatigue, facial numbness x 2 weeks. Last HD 9/8. Patient states that he recently had COVID a few weeks ago and was recovering from that when he began to feel poorly again. Was tested for COVID again at HD and reportedly negative. Has had rigor-like shaking chills during HD and feels like he cannot get warm (notes that his house thermostat is set at 84 degrees post-treatment). He reports fever/chills, no CP/SOB, found to be COVID+ with hypertensive urgency.   En route with EMS, HR 86, BP 233/139, RR 86, SpO2 99% on RA. On ED arrival, patient was afebrile with HR 90s, BP 225/128, RR 21, SpO2 100%. Labs were notable for WBC 7.1, Hgb 8.3, Plt 255. Na 135, K 4.6, CO2 22, BUN/Cr 54/16.10. AST/ALT WNL, Alk Phos 139, Tbili 0.9. Trop 24. BNP 1736.2. CT Head NAICA. Labetalol  10mg  IV x 1 given without improvement in BP. Cardene  gtt was initiated.  Admitted under PCCM, eventually Cardene  stopped and he was transferred to [facility] on 01/23/2024.  Despite this, BPs have remained elevated throughout entire admission.  Initially needed Cardene  which was weaned off.  When patient was transitioned under [facility], patient was on home dose of nifedipine  90 mg XL, hydralazine  100 mg 3 times daily, labetalol  200 mg twice daily, losartan  100 mg daily and yet blood pressure was significantly uncontrolled with systolic in 200 range and diastolic in 100 range.   Transitioned his nifedipine  to amlodipine  and started him on Imdur  30 mg on 01/23/2023.  Imdur  eventually escalated to maximum dose of 120 mg due to uncontrolled blood pressure and then clonidine  0.3 mg 3 times daily was added.  Patient's blood pressure was finally somewhat controlled however patient declined taking labetalol  stating that labetalol  caused him to fall asleep and he becomes lethargic and does not feel well.  Per nephrology, he had tolerated low-dose of Coreg  before.  They talked to him started him on Coreg  12.5 mg p.o. twice daily.  At this point in time, patient is maxed out on all the antihypertensive medications.  Nephrology knows him very well and per them, blood pressure 180/105 or less will be okay for him to go home.    On review of his recent [facility] admission, doxazosin  eventually added to regimen and he was DC'ed on that.  Unclear why stopped, will restart here-->discussed with patient today, reports it was stopped due to dizziness.  He declines it here b/c of this, will stop as it wasn't really helping with his .   Assessment & Plan: Hypertensive crisis History of HTN, refractory/difficult to control c/b poor compliance/admitted with hypertensive crisis, POA:  - remains fairly maxed out on meds.  - Despite that, BPs remain elevated in 190s, up to >200s while in dialysis and afterwards - Hopeful that persistent/continued dialysis will help with his blood pressure.  Have had to stop early multiple times due to reactions.  See below for ESRD and possibility of peritoneal dialysis.   Chest pain:  - Chest pain recurrence  this PM, dull, aching.   -Greatly appreciate cardiology input.  Cardiology has now signed off.   - short-term, resolved.  Will monitor.  He remains on tele, no issues noted.   ESRD on HD (MWFS via RIJ TDC) Alport syndrome LUE AVG dysfunction - There have been multiple issues with reactions to the dialysis process.  Nephrology working closely with patient  but has had ongoing reactions. - Vascular surgery consulted for possible peritoneal dialysis.  We are starting this process now to see if he qualifies. - Nephrology is reaching out to peritoneal dialysis nurses to evaluate his home to see if he continues to qualify.  Waiting to hear back.    Groin itching: - persists, no help with atarax /nystatin  - with notable flaking and some excoriations.  Likely fungal. - switching nystatin  to combo of clotrimazole /hydrocortisone .  Stop atarax , start benadryl  PRN for itching.   HFpEF / BLE edema Echo 12/2023 with EF 55-60%, severe concentric LVH, G2DD, small to moderate pericardial effusion. Optimize electrolytes for K > 4, Mg > 2.  Volume removal by HD - Echo reviewed from yesterday, being performed this afternoon.  Hard of hearing: - secondary to Alport syndrome above.  Sometimes requires writing down questions to answer better   Diarrhea, acute-on-chronic Increased frequency of diarrhea, per patient.   Resolved.    Anemia of chronic disease - Trend H&H - Monitor for signs of active bleeding - Transfuse for Hgb < 7.0 or hemodynamically significant bleeding.  Hemoglobin labile, currently 8.5.  Likely secondary to ESRD   GERD - PPI   Bipolar 1 disorder Depression - Continue home Depakote    Tobacco use - Encourage cessation - Nicotine  patch.      DVT prophylaxis:  heparin  injection 5,000 Units Start: 01/21/24 0600 SCDs Start: 01/20/24 2230 Family communication: Aunt/uncle at bedside plus another aunt on the phone. Code Status:   Code Status: Full Code Level of care: Progressive Status is: Inpatient   Consults called: Nephrology, Cardiology  Subjective: Awake and alert.  Still itching, no real relief thus far.   Objective: Vitals:   02/01/24 1420 02/01/24 1427 02/01/24 1500 02/01/24 1525  BP: (!) 260/114 (!) 257/110 (!) 222/124 (!) 222/124  Pulse:      Resp:   18   Temp:      TempSrc:   Oral   SpO2:      Weight:       Height:        Intake/Output Summary (Last 24 hours) at 02/01/2024 1740 Last data filed at 02/01/2024 1236 Gross per 24 hour  Intake --  Output 4 ml  Net -4 ml   Filed Weights   01/31/24 0621 02/01/24 0320 02/01/24 0835  Weight: 75.9 kg 77.5 kg 78 kg   Body mass index is 26.15 kg/m.  Gen: 38 y.o. male in no apparent distress.  Nontoxic, but looks uncomfortable Pulm: Non-labored breathing.  Clear to auscultation bilaterally.  CV: Regular rate and rhythm.  GI: Abdomen soft, non-tender, non-distended, with normoactive bowel sounds. No organomegaly or masses felt. GU:  flaking and some excoriations noted BL inguinal folds.  No bleeding/redness/erythema.   Ext: Warm, no deformities Skin: No rashes, lesions noulcers Neuro: Alert and oriented. No focal neurological deficits. Psych: Calm  Judgement and insight appear normal. Mood & affect appropriate.     I have personally reviewed the following labs and images: CBC: Recent Labs  Lab 01/28/24 0322 01/29/24 0339 01/30/24 0351 01/31/24 0229 02/01/24 0406  WBC 7.4 8.7 6.0 4.5  6.3  HGB 8.5* 8.5* 8.1* 8.2* 8.4*  HCT 26.8* 27.2* 26.0* 26.5* 26.5*  MCV 89.0 90.1 89.0 90.1 88.6  PLT 199 196 163 206 184   BMP &GFR Recent Labs  Lab 01/27/24 0356 01/29/24 0747 01/30/24 0351 01/31/24 0229 02/01/24 0406  NA 127* 128* 131* 134* 130*  K 5.5* 6.0* 4.8 4.2 4.9  CL 92* 92* 91* 95* 93*  CO2 22 22 24 26 23   GLUCOSE 87 82 91 96 93  BUN 63* 74* 54* 44* 70*  CREATININE 13.03* 13.44* 10.26* 8.57* 11.69*  CALCIUM  8.9 9.1 8.9 8.9 9.1  PHOS  --  5.6* 5.1* 4.8* 5.5*   Estimated Creatinine Clearance: 8.5 mL/min (A) (by C-G formula based on SCr of 11.69 mg/dL (H)). Liver & Pancreas: Recent Labs  Lab 01/29/24 0747 01/30/24 0351 01/31/24 0229 02/01/24 0406  ALBUMIN  3.0* 3.0* 2.9* 3.0*   No results for input(s): LIPASE, AMYLASE in the last 168 hours. No results for input(s): AMMONIA in the last 168 hours. Diabetic: No results  for input(s): HGBA1C in the last 72 hours. Recent Labs  Lab 01/26/24 1205 01/28/24 1444 01/29/24 1728 01/29/24 2321 01/31/24 0432  GLUCAP 74 110* 111* 109* 97   Cardiac Enzymes: No results for input(s): CKTOTAL, CKMB, CKMBINDEX, TROPONINI in the last 168 hours. No results for input(s): PROBNP in the last 8760 hours. Coagulation Profile: No results for input(s): INR, PROTIME in the last 168 hours. Thyroid Function Tests: No results for input(s): TSH, T4TOTAL, FREET4, T3FREE, THYROIDAB in the last 72 hours. Lipid Profile: No results for input(s): CHOL, HDL, LDLCALC, TRIG, CHOLHDL, LDLDIRECT in the last 72 hours. Anemia Panel: No results for input(s): VITAMINB12, FOLATE, FERRITIN, TIBC, IRON , RETICCTPCT in the last 72 hours. Urine analysis:    Component Value Date/Time   COLORURINE STRAW (A) 06/07/2021 1758   APPEARANCEUR CLEAR 06/07/2021 1758   LABSPEC 1.008 06/07/2021 1758   PHURINE 6.0 06/07/2021 1758   GLUCOSEU 50 (A) 06/07/2021 1758   HGBUR SMALL (A) 06/07/2021 1758   BILIRUBINUR NEGATIVE 06/07/2021 1758   KETONESUR NEGATIVE 06/07/2021 1758   PROTEINUR >=300 (A) 06/07/2021 1758   UROBILINOGEN 0.2 05/29/2013 1121   NITRITE NEGATIVE 06/07/2021 1758   LEUKOCYTESUR NEGATIVE 06/07/2021 1758   Sepsis Labs: Invalid input(s): PROCALCITONIN, LACTICIDVEN  Microbiology: No results found for this or any previous visit (from the past 240 hours).   Radiology Studies: No results found.   Scheduled Meds:  amLODipine   10 mg Oral Daily   carvedilol   12.5 mg Oral BID   Chlorhexidine  Gluconate Cloth  6 each Topical Q0600   cinacalcet   30 mg Oral Q M,W,F-1800   cloNIDine   0.3 mg Oral Q8H   clotrimazole    Topical BID   darbepoetin (ARANESP ) injection - DIALYSIS  150 mcg Subcutaneous Q Mon-1800   divalproex   500 mg Oral Daily   heparin   5,000 Units Subcutaneous Q8H   hydrALAZINE   100 mg Oral TID   hydrocortisone  cream    Topical TID   isosorbide  mononitrate  120 mg Oral Daily   losartan   100 mg Oral QPM   pantoprazole   40 mg Oral BID   sevelamer  carbonate  1,600 mg Oral TID WC   sodium zirconium cyclosilicate   10 g Oral Q M,W,F   spironolactone   25 mg Oral Daily   Continuous Infusions:   LOS: 12 days   35 minutes with more than 50% spent in reviewing records, counseling patient/family and coordinating care.  Reyes VEAR Gaw, MD [facility] [email or web address]  02/01/2024, 5:40 PM

## 2024-02-01 NOTE — Procedures (Addendum)
 Patient seen on Hemodialysis. BP (!) 220/102 (BP Location: Right Leg)   Pulse 74   Temp 98.6 F (37 C) (Oral)   Resp 13   Ht 5' 8 (1.727 m)   Wt 78 kg   SpO2 100%   BMI 26.15 kg/m   QB 400, UF goal 4L Tolerating treatment without complaints at this time. No reaction to Nipro with benadryl  use/rinse.   Gordy Blanch MD [facility]. Office # [phone] Pager # [phone] 9:51 AM

## 2024-02-01 NOTE — Progress Notes (Signed)
 PT Cancellation Note  Patient Details Name: [name] MRN: [id number] DOB: 07/31/1985   Cancelled Treatment:    Reason Eval/Treat Not Completed: Medical issues which prohibited therapy. Pt remains hypertensive, with last documented BP of 222/124 at 1500. PT will attempt to follow up when pt's BP is better controlled.    [name] 02/01/2024, 3:25 PM

## 2024-02-01 NOTE — Progress Notes (Signed)
 This Clinical research associate received Patient back from hemodialysis hypertensive at 260/114 Scheduled and PRN medications held earlier related to dialysis treatment protocols MD made aware of hypertension new orders received to give evening dose of losartan  now in addition to already given scheduled and PRN meds that were given on patient arrival. Patient bed in lowest position with call light in reach safety ensured

## 2024-02-02 DIAGNOSIS — I169 Hypertensive crisis, unspecified: Secondary | ICD-10-CM | POA: Diagnosis not present

## 2024-02-02 LAB — RENAL FUNCTION PANEL
Albumin: 3 g/dL — ABNORMAL LOW (ref 3.5–5.0)
Anion gap: 17 — ABNORMAL HIGH (ref 5–15)
BUN: 55 mg/dL — ABNORMAL HIGH (ref 6–20)
CO2: 21 mmol/L — ABNORMAL LOW (ref 22–32)
Calcium: 8.4 mg/dL — ABNORMAL LOW (ref 8.9–10.3)
Chloride: 93 mmol/L — ABNORMAL LOW (ref 98–111)
Creatinine, Ser: 10.01 mg/dL — ABNORMAL HIGH (ref 0.61–1.24)
GFR, Estimated: 6 mL/min — ABNORMAL LOW (ref >60)
Glucose, Bld: 106 mg/dL — ABNORMAL HIGH (ref 70–99)
Phosphorus: 4.7 mg/dL — ABNORMAL HIGH (ref 2.5–4.6)
Potassium: 4.2 mmol/L (ref 3.5–5.1)
Sodium: 131 mmol/L — ABNORMAL LOW (ref 135–145)

## 2024-02-02 LAB — CBC
HCT: 27.6 % — ABNORMAL LOW (ref 39.0–52.0)
Hemoglobin: 8.7 g/dL — ABNORMAL LOW (ref 13.0–17.0)
MCH: 28.1 pg (ref 26.0–34.0)
MCHC: 31.5 g/dL (ref 30.0–36.0)
MCV: 89 fL (ref 80.0–100.0)
Platelets: 193 K/uL (ref 150–400)
RBC: 3.1 MIL/uL — ABNORMAL LOW (ref 4.22–5.81)
RDW: 17.2 % — ABNORMAL HIGH (ref 11.5–15.5)
WBC: 5.5 K/uL (ref 4.0–10.5)
nRBC: 0 % (ref 0.0–0.2)

## 2024-02-02 MED ORDER — SPIRONOLACTONE 25 MG PO TABS
50.0000 mg | ORAL_TABLET | Freq: Every day | ORAL | Status: DC
  Administered 2024-02-02 – 2024-02-06 (×5): 50 mg via ORAL
  Filled 2024-02-02 (×5): qty 2

## 2024-02-02 NOTE — Progress Notes (Signed)
 Patient ID: [name], male   DOB: 1985/12/05, 38 y.o.   MRN: [id number] [name] Progress Note   Assessment/ Plan:   1.  Hypertensive emergency: Continue efforts at trying to titrate antihypertensive therapy dosing largely limited by side effects and intolerances.  Will uptitrate spironolactone  to 50 mg daily. 2. ESRD: Continue hemodialysis on a Monday/Wednesday/Friday schedule.  He does not appear that he has an allergic reaction to the dialyzer because yesterday's reaction was towards the final 15 minutes of dialysis and appears to be largely on anxiety/panic reaction.  We will continue to utilize hypoallergenic dialyzer with 1 L normal saline rinse and treat with Benadryl /acetaminophen  at the start.  There was some consideration about transitioning to peritoneal dialysis and home therapy was consulted for home visit prior to making this decision.  He was evaluated by vascular surgery and found to be a suitable candidate for PD catheter placement if necessary. 3. Anemia: Low hemoglobin/hematocrit noted but trending upward, ESA therapy likely to be limited by his significantly elevated blood pressures. 4. CKD-MBD: Phosphorus level and calcium  level currently at goal, continue sevelamer  for phosphorus binding and Cinacalcet  for PTH control. 5. Nutrition: Continue renal diet with sodium restriction and fluid restriction.  Subjective:   Post dialytic hypertension noted with resumption of antihypertensive therapy but persistently elevated blood pressure.  He denies any chest pain or shortness of breath.   Objective:   BP (!) 192/100 (BP Location: Right Leg)   Pulse 79   Temp 97.8 F (36.6 C) (Axillary)   Resp 19   Ht 5' 8 (1.727 m)   Wt 75.4 kg   SpO2 98%   BMI 25.29 kg/m   Physical Exam: Gen: Sleeping comfortably in bed, awakens to voice/touch Respiratory: Clear to auscultation bilaterally, no rales/rhonchi Cardiovascular: Pulse regular rhythm, normal rate, S1 and  S2 Abd: Soft, flat, nontender, bowel sounds normal Ext: No palpable lower extremity edema.  Left arm AVG with right IJ [facility]  Labs: BMET Recent Labs  Lab 01/27/24 0356 01/29/24 0747 01/30/24 0351 01/31/24 0229 02/01/24 0406  NA 127* 128* 131* 134* 130*  K 5.5* 6.0* 4.8 4.2 4.9  CL 92* 92* 91* 95* 93*  CO2 22 22 24 26 23   GLUCOSE 87 82 91 96 93  BUN 63* 74* 54* 44* 70*  CREATININE 13.03* 13.44* 10.26* 8.57* 11.69*  CALCIUM  8.9 9.1 8.9 8.9 9.1  PHOS  --  5.6* 5.1* 4.8* 5.5*   CBC Recent Labs  Lab 01/29/24 0339 01/30/24 0351 01/31/24 0229 02/01/24 0406  WBC 8.7 6.0 4.5 6.3  HGB 8.5* 8.1* 8.2* 8.4*  HCT 27.2* 26.0* 26.5* 26.5*  MCV 90.1 89.0 90.1 88.6  PLT 196 163 206 184      Medications:     amLODipine   10 mg Oral Daily   carvedilol   12.5 mg Oral BID   Chlorhexidine  Gluconate Cloth  6 each Topical Q0600   cinacalcet   30 mg Oral Q M,W,F-1800   cloNIDine   0.3 mg Oral Q8H   clotrimazole    Topical BID   darbepoetin (ARANESP ) injection - DIALYSIS  150 mcg Subcutaneous Q Mon-1800   divalproex   500 mg Oral Daily   heparin   5,000 Units Subcutaneous Q8H   hydrALAZINE   100 mg Oral TID   hydrocortisone  cream   Topical TID   isosorbide  mononitrate  120 mg Oral Daily   losartan   100 mg Oral QPM   pantoprazole   40 mg Oral BID   sevelamer  carbonate  1,600 mg  Oral TID WC   sodium zirconium cyclosilicate   10 g Oral Q M,W,F   spironolactone   25 mg Oral Daily   Gordy Blanch, MD 02/02/2024, 9:22 AM

## 2024-02-02 NOTE — Treatment Plan (Signed)
 Occupational Therapy Treatment Patient Details Name: [name] MRN: [id number] DOB: [birth year] Today's Date: 02/02/2024   History of present illness 38 yo male presenting 9/10 with weakness, chills, body aches and general malaise facial numbness x2 weeks. Recent admission 8/16 - 8/18 COVID (+) with hypertension urgency PMH: HTN, HFpEF, Alport syndrome, ESRD on HD ( MWFS) GERD, Anemia, bipolar 1, hearing loss, depression, tobacco use.   OT comments  All education is complete and patient indicates understanding.pt able to complete all adls mod I or indep. Pt with elevated BP noted during task. Pt with HA at this time. Pt expressed concerns and anxiety about HD and multiple life changes recently.Pt expressing large stress at this time. Pt might benefit from post acute resources to help with life stressers. OT to sign off acutely.       If plan is discharge home, recommend the following:      Equipment Recommendations  None recommended by OT    Recommendations for Other Services      Precautions / Restrictions Precautions Precautions: Fall Recall of Precautions/Restrictions: Impaired Precaution/Restrictions Comments: hoh Restrictions Weight Bearing Restrictions Per Provider Order: No       Mobility Bed Mobility Overal bed mobility: Modified Independent                  Transfers Overall transfer level: Modified independent                       Balance                                           ADL either performed or assessed with clinical judgement   ADL Overall ADL's : Independent                                       General ADL Comments: pt completed gathering all items, sink level bathing and standing. pt with Bp monitored as pt with HA on arrival. MAP 117    Extremity/Trunk Assessment Upper Extremity Assessment Upper Extremity Assessment: Overall WFL for tasks assessed   Lower Extremity Assessment Lower  Extremity Assessment: Overall WFL for tasks assessed        Vision   Vision Assessment?: No apparent visual deficits   Perception     Praxis     Communication Communication Communication: Impaired Factors Affecting Communication: Hearing impaired   Cognition Arousal: Alert Behavior During Therapy: WFL for tasks assessed/performed Cognition: No apparent impairments                               Following commands: Intact        Cueing   Cueing Techniques: Verbal cues, Other (comments)  Exercises      Shoulder Instructions       General Comments hr72    Pertinent Vitals/ Pain       Pain Assessment Faces Pain Scale: Hurts little more Pain Location: head Pain Descriptors / Indicators: Headache Pain Intervention(s): Monitored during session, Repositioned  Home Living  Prior Functioning/Environment              Frequency           Progress Toward Goals  OT Goals(current goals can now be found in the care plan section)  Progress towards OT goals: Goals met/education completed, patient discharged from OT  ADL Goals Pt Will Perform Lower Body Dressing: with modified independence;sit to/from stand Pt Will Transfer to Toilet: with modified independence;ambulating;regular height toilet Additional ADL Goal #1: pt will complete balance assesment without LOB  Plan      Co-evaluation                 AM-PAC OT 6 Clicks Daily Activity     Outcome Measure   Help from another person eating meals?: None Help from another person taking care of personal grooming?: None Help from another person toileting, which includes using toliet, bedpan, or urinal?: None Help from another person bathing (including washing, rinsing, drying)?: None Help from another person to put on and taking off regular upper body clothing?: None Help from another person to put on and taking off regular lower  body clothing?: None 6 Click Score: 24    End of Session    OT Visit Diagnosis: Unsteadiness on feet (R26.81);Muscle weakness (generalized) (M62.81)   Activity Tolerance Patient tolerated treatment well   Patient Left in bed;with call bell/phone within reach   Nurse Communication Mobility status        Time: 8548-8486 OT Time Calculation (min): 22 min  Charges: OT General Charges $OT Visit: 1 Visit OT Treatments $Self Care/Home Management : 8-22 mins   Brynn, OTR/L  Acute Rehabilitation Services Office: [phone] .   [name] 02/02/2024, 3:48 PM

## 2024-02-02 NOTE — Progress Notes (Signed)
 Physical Therapy Treatment Patient Details Name: [name] MRN: [id number] DOB: 12-Aug-1985 Today's Date: 02/02/2024   History of Present Illness 38 yo male presenting 9/10 with weakness, chills, body aches and general malaise facial numbness x2 weeks. Recent admission 8/16 - 8/18 COVID (+) with hypertension urgency PMH: HTN, HFpEF, Alport syndrome, ESRD on HD ( MWFS) GERD, Anemia, bipolar 1, hearing loss, depression, tobacco use.    PT Comments  Pt received in supine sleeping but awoken to window blinds opening, pt agreeable to transfer to standing only with encouragement, then BP too elevated in standing (taken on R leg, RN notified) and pt c/o fatigue/malaise so not able to progress gait. Pt CGA for transfers and modI for bed mobility using hospital bed features. Pt requesting to sit EOB to eat lunch, bed alarm on for safety, RN arriving to room at end of session after being notified of pt elevated BP reading. Pt continues to benefit from PT services to progress toward functional mobility goals.    If plan is discharge home, recommend the following: Assist for transportation   Can travel by private vehicle        Equipment Recommendations  None recommended by PT    Recommendations for Other Services Other (comment) (offered to consult chaplain as pt c/o depression/stress from medical news but pt refusing)     Precautions / Restrictions Precautions Precautions: Fall Recall of Precautions/Restrictions: Impaired Precaution/Restrictions Comments: HoH, decreased motivation to participate Restrictions Weight Bearing Restrictions Per Provider Order: No     Mobility  Bed Mobility Overal bed mobility: Needs Assistance, Modified Independent Bed Mobility: Supine to Sit, Sit to Supine, Rolling     Supine to sit: Modified independent (Device/Increase time), HOB elevated, Used rails Sit to supine: Modified independent (Device/Increase time), Used rails, HOB elevated   General bed  mobility comments: Received sleeping with diagonal position in bed and head/shoulder pushing against R bed rail, but awoken with increased time after blinds raised to allow light into room and HOB lowered slightly. PTA discussed need to position pt more safely to prevent him sliding OOB; No assist to rise to R EOB, pt raising HOB before return to supine then modI rolling to opposite side of bed and transfer to L EOB to sit up and eat lunch at end of session.    Transfers Overall transfer level: Needs assistance Equipment used: None Transfers: Sit to/from Stand Sit to Stand: Contact guard assist           General transfer comment: CGA for HHA/pt using R bed rail PRN with STS, mild instability but no severe LOB or buckling.    Ambulation/Gait               General Gait Details: pt defers prior to standing, BP checked once pt static standing and noted to be very elevated (checked in R leg so may be inaccurate) pt returned to supine for safety and RN notified.   Stairs             Wheelchair Mobility     Tilt Bed    Modified Rankin (Stroke Patients Only)       Balance Overall balance assessment: Mild deficits observed, not formally tested                                          Communication Communication Communication: Impaired Factors Affecting Communication: Hearing  impaired  Cognition Arousal: Alert Behavior During Therapy: WFL for tasks assessed/performed   PT - Cognitive impairments: No apparent impairments, Other                       PT - Cognition Comments: Pt c/o depression due to medical news, PTA offered to have chaplain speak with him about his feelings related to this, but he defers, stating I'm independent but also reports decreased interest to participate in OOB therapies generally due to low mood and not feeling well (did not elaborate on symptoms). Following commands: Intact      Cueing Cueing Techniques:  Verbal cues, Other (comments) (written at times due to impaired hearing)  Exercises      General Comments General comments (skin integrity, edema, etc.): BP 177/98 per RN supine prior to session; BP checked standing BP 219/146 (166) in R leg, RN notified after pt returned to supine. HR 67 bpm standing. Pt returning himself to sit EOB to eat lunch which was arriving at end of session, despite encouragement to wait for supine BP in leg but he defers, RN notified.      Pertinent Vitals/Pain Pain Assessment Pain Assessment: No/denies pain Pain Intervention(s): Limited activity within patient's tolerance, Monitored during session, Repositioned, Other (comment) (RN notified of pt elevated BP; pt denies HA during session)    Home Living                          Prior Function            PT Goals (current goals can now be found in the care plan section) Acute Rehab PT Goals Patient Stated Goal: Get back into shape. Exercise again. PT Goal Formulation: With patient Time For Goal Achievement: 02/05/24 Progress towards PT goals: Not progressing toward goals - comment (due to elevated BP in standing)    Frequency    Min 2X/week      PT Plan      Co-evaluation              AM-PAC PT 6 Clicks Mobility   Outcome Measure  Help needed turning from your back to your side while in a flat bed without using bedrails?: None Help needed moving from lying on your back to sitting on the side of a flat bed without using bedrails?: None Help needed moving to and from a bed to a chair (including a wheelchair)?: A Little Help needed standing up from a chair using your arms (e.g., wheelchair or bedside chair)?: A Little Help needed to walk in hospital room?: A Little (anticipated based on transfer but not able to assess today due to high BP) Help needed climbing 3-5 steps with a railing? : Total 6 Click Score: 18    End of Session Equipment Utilized During Treatment: Other  (comment) (offered AD/ gait belt but pt declines) Activity Tolerance: Patient limited by fatigue;Other (comment) (c/o feeling unwell/depressed) Patient left: with call bell/phone within reach;in bed;with bed alarm set;Other (comment) (pt sitting EOB to eat lunch which arrived) Nurse Communication: Mobility status;Other (comment) (very high BP reading when he stood up (taken in R leg)) PT Visit Diagnosis: Unsteadiness on feet (R26.81);Other abnormalities of gait and mobility (R26.89);Muscle weakness (generalized) (M62.81);Difficulty in walking, not elsewhere classified (R26.2);Pain     Time: 8780-8764 PT Time Calculation (min) (ACUTE ONLY): 16 min  Charges:    $Therapeutic Activity: 8-22 mins PT General Charges $$ ACUTE PT VISIT:  1 Visit                     Connell SQUIBB., PTA Acute Rehabilitation Services Secure Chat Preferred 9a-5:30pm Office: [phone]    Connell HERO [facility] 02/02/2024, 1:04 PM

## 2024-02-02 NOTE — Plan of Care (Signed)

## 2024-02-02 NOTE — Progress Notes (Signed)
 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 Recent admission 8/16 - 8/18 for malaise/body aches/GI symptoms/dizziness, presented to [facility] ED 9/10 for weakness, chills, body aches, fatigue, facial numbness x 2 weeks. Last HD 9/8. Patient states that he recently had COVID a few weeks ago and was recovering from that when he began to feel poorly again. Was tested for COVID again at HD and reportedly negative. Has had rigor-like shaking chills during HD and feels like he cannot get warm (notes that his house thermostat is set at 84 degrees post-treatment). He reports fever/chills, no CP/SOB, found to be COVID+ with hypertensive urgency.   En route with EMS, HR 86, BP 233/139, RR 86, SpO2 99% on RA. On ED arrival, patient was afebrile with HR 90s, BP 225/128, RR 21, SpO2 100%. Labs were notable for WBC 7.1, Hgb 8.3, Plt 255. Na 135, K 4.6, CO2 22, BUN/Cr 54/16.10. AST/ALT WNL, Alk Phos 139, Tbili 0.9. Trop 24. BNP 1736.2. CT Head NAICA. Labetalol  10mg  IV x 1 given without improvement in BP. Cardene  gtt was initiated.  Admitted under PCCM, eventually Cardene  stopped and he was transferred to [facility] on 01/23/2024.  Despite this, BPs have remained elevated throughout entire admission.  On review of his recent [facility] admission, doxazosin  eventually added to regimen and he was DC'ed on that.  Unclear why stopped, will restart here-->discussed with patient today, reports it was stopped due to dizziness.  He declines it here b/c of this, will stop as it wasn't really helping with his .   Assessment &  Plan: Hypertensive crisis History of HTN, refractory/difficult to control c/b poor compliance/admitted with hypertensive crisis, POA:  - remains fairly maxed out on meds.  - Despite that, BPs remain elevated in 190s, up to >200s while in dialysis and afterwards - Hopeful that persistent/continued dialysis will help with his blood pressure.  Have had to stop early multiple times due to reactions.  See below for ESRD and possibility of peritoneal dialysis. - See nephrology note today, sounds like no longer having allergic reactions.     Chest pain:  - Chest pain recurrence this PM, dull, aching.   -Greatly appreciate cardiology input.  Cardiology has now signed off.   - short-term, resolved.  Will monitor.  He remains on tele, no issues noted.   ESRD on HD (MWFS via RIJ TDC) Alport syndrome LUE AVG dysfunction - There have been multiple issues with reactions to the dialysis process.  Nephrology working closely with patient but has had ongoing reactions. - Vascular surgery consulted for possible peritoneal dialysis.  We are starting this process now to see if he qualifies. - Nephrology is reaching out to peritoneal dialysis nurses to evaluate his home to see if he continues to qualify.  Waiting to hear back.   - He himself is very eager to pursue transplant option.  Has been attempting to work with [facility] on this but has missed appointments secondary to dialysis. - He tells me today he has several family members willing to donate a kidney if they are a match.  Question for nephrology whether this is a possibility?  Groin itching: -  Better today after switch to hydrocortisone /clotrimazole .  Most likely fungal.  HFpEF / BLE edema Echo 12/2023 with EF 55-60%, severe concentric LVH, G2DD, small to moderate pericardial effusion.   Volume removal by HD  Hard of hearing: - secondary to Alport syndrome above.  Sometimes requires writing down questions to answer better   Diarrhea,  acute-on-chronic Increased frequency of diarrhea, per patient.   Resolved.    Anemia of chronic disease - Trend H&H - Monitor for signs of active bleeding - Transfuse for Hgb < 7.0 or hemodynamically significant bleeding.  Hemoglobin labile, currently 8.5.  Likely secondary to ESRD   GERD - PPI   Bipolar 1 disorder Depression - Continue home Depakote    Tobacco use - Encourage cessation - Nicotine  patch.      DVT prophylaxis:  heparin  injection 5,000 Units Start: 01/21/24 0600 SCDs Start: 01/20/24 2230 Family communication: Aunt/uncle at bedside plus another aunt on the phone. Code Status:   Code Status: Full Code Level of care: Progressive Status is: Inpatient   Consults called: Nephrology, Cardiology  Subjective: Awake and alert.  Frustrated about ongoing need for dialysis despite his young age.  He would like to know if he would be a candidate for transplant, has failed members reportedly ready to donate a kidney.  Objective: Vitals:   02/02/24 0335 02/02/24 0825 02/02/24 1122 02/02/24 1143  BP: (!) 183/102 (!) 192/100 (!) 177/98 (!) 177/98  Pulse: 72 79 69 69  Resp: 20 19 17    Temp: 98.2 F (36.8 C) 97.8 F (36.6 C) 98 F (36.7 C)   TempSrc: Oral Axillary Oral   SpO2: 100% 98% 99%   Weight: 75.4 kg     Height:       No intake or output data in the 24 hours ending 02/02/24 1518  Filed Weights   02/01/24 0320 02/01/24 0835 02/02/24 0335  Weight: 77.5 kg 78 kg 75.4 kg   Body mass index is 25.29 kg/m.  Gen: 38 y.o. male in no apparent distress.  Nontoxic, hard of hearing Pulm: Non-labored breathing.  Clear to auscultation bilaterally.  CV: Regular rate and rhythm.  GI: Abdomen soft, non-tender, non-distended, with normoactive bowel sounds. No organomegaly or masses felt. GU:  flaking and some excoriations noted BL inguinal folds.  No bleeding/redness/erythema.   Ext: Warm, no deformities Skin: No rashes, lesions noulcers Neuro: Alert and oriented. No  focal neurological deficits. Psych: Calm  Judgement and insight appear normal. Mood & affect appropriate.     I have personally reviewed the following labs and images: CBC: Recent Labs  Lab 01/29/24 0339 01/30/24 0351 01/31/24 0229 02/01/24 0406 02/02/24 0947  WBC 8.7 6.0 4.5 6.3 5.5  HGB 8.5* 8.1* 8.2* 8.4* 8.7*  HCT 27.2* 26.0* 26.5* 26.5* 27.6*  MCV 90.1 89.0 90.1 88.6 89.0  PLT 196 163 206 184 193   BMP &GFR Recent Labs  Lab 01/29/24 0747 01/30/24 0351 01/31/24 0229 02/01/24 0406 02/02/24 0947  NA 128* 131* 134* 130* 131*  K 6.0* 4.8 4.2 4.9 4.2  CL 92* 91* 95* 93* 93*  CO2 22 24 26 23  21*  GLUCOSE 82 91 96 93 106*  BUN 74* 54* 44* 70* 55*  CREATININE 13.44* 10.26* 8.57* 11.69* 10.01*  CALCIUM  9.1 8.9 8.9 9.1 8.4*  PHOS 5.6* 5.1* 4.8* 5.5* 4.7*   Estimated Creatinine Clearance: 9.9 mL/min (A) (by C-G formula based on SCr of 10.01 mg/dL (H)). Liver & Pancreas: Recent Labs  Lab 01/29/24 0747 01/30/24 0351 01/31/24 0229 02/01/24  0406 02/02/24 0947  ALBUMIN  3.0* 3.0* 2.9* 3.0* 3.0*   No results for input(s): LIPASE, AMYLASE in the last 168 hours. No results for input(s): AMMONIA in the last 168 hours. Diabetic: No results for input(s): HGBA1C in the last 72 hours. Recent Labs  Lab 01/28/24 1444 01/29/24 1728 01/29/24 2321 01/31/24 0432 02/01/24 1951  GLUCAP 110* 111* 109* 97 90   Cardiac Enzymes: No results for input(s): CKTOTAL, CKMB, CKMBINDEX, TROPONINI in the last 168 hours. No results for input(s): PROBNP in the last 8760 hours. Coagulation Profile: No results for input(s): INR, PROTIME in the last 168 hours. Thyroid Function Tests: No results for input(s): TSH, T4TOTAL, FREET4, T3FREE, THYROIDAB in the last 72 hours. Lipid Profile: No results for input(s): CHOL, HDL, LDLCALC, TRIG, CHOLHDL, LDLDIRECT in the last 72 hours. Anemia Panel: No results for input(s): VITAMINB12, FOLATE, FERRITIN,  TIBC, IRON , RETICCTPCT in the last 72 hours. Urine analysis:    Component Value Date/Time   COLORURINE STRAW (A) 06/07/2021 1758   APPEARANCEUR CLEAR 06/07/2021 1758   LABSPEC 1.008 06/07/2021 1758   PHURINE 6.0 06/07/2021 1758   GLUCOSEU 50 (A) 06/07/2021 1758   HGBUR SMALL (A) 06/07/2021 1758   BILIRUBINUR NEGATIVE 06/07/2021 1758   KETONESUR NEGATIVE 06/07/2021 1758   PROTEINUR >=300 (A) 06/07/2021 1758   UROBILINOGEN 0.2 05/29/2013 1121   NITRITE NEGATIVE 06/07/2021 1758   LEUKOCYTESUR NEGATIVE 06/07/2021 1758   Sepsis Labs: Invalid input(s): PROCALCITONIN, LACTICIDVEN  Microbiology: No results found for this or any previous visit (from the past 240 hours).   Radiology Studies: No results found.   Scheduled Meds:  amLODipine   10 mg Oral Daily   carvedilol   12.5 mg Oral BID   Chlorhexidine  Gluconate Cloth  6 each Topical Q0600   cinacalcet   30 mg Oral Q M,W,F-1800   cloNIDine   0.3 mg Oral Q8H   clotrimazole    Topical BID   darbepoetin (ARANESP ) injection - DIALYSIS  150 mcg Subcutaneous Q Mon-1800   divalproex   500 mg Oral Daily   heparin   5,000 Units Subcutaneous Q8H   hydrALAZINE   100 mg Oral TID   hydrocortisone  cream   Topical TID   isosorbide  mononitrate  120 mg Oral Daily   losartan   100 mg Oral QPM   pantoprazole   40 mg Oral BID   sevelamer  carbonate  1,600 mg Oral TID WC   sodium zirconium cyclosilicate   10 g Oral Q M,W,F   spironolactone   50 mg Oral Daily   Continuous Infusions:   LOS: 13 days   35 minutes with more than 50% spent in reviewing records, counseling patient/family and coordinating care.  Reyes VEAR Gaw, MD [facility] [email or web address] 02/02/2024, 3:18 PM

## 2024-02-03 DIAGNOSIS — I169 Hypertensive crisis, unspecified: Secondary | ICD-10-CM | POA: Diagnosis not present

## 2024-02-03 LAB — CBC
HCT: 26.7 % — ABNORMAL LOW (ref 39.0–52.0)
Hemoglobin: 8.4 g/dL — ABNORMAL LOW (ref 13.0–17.0)
MCH: 27.7 pg (ref 26.0–34.0)
MCHC: 31.5 g/dL (ref 30.0–36.0)
MCV: 88.1 fL (ref 80.0–100.0)
Platelets: 201 K/uL (ref 150–400)
RBC: 3.03 MIL/uL — ABNORMAL LOW (ref 4.22–5.81)
RDW: 17.1 % — ABNORMAL HIGH (ref 11.5–15.5)
WBC: 5.9 K/uL (ref 4.0–10.5)
nRBC: 0 % (ref 0.0–0.2)

## 2024-02-03 LAB — RENAL FUNCTION PANEL
Albumin: 3.1 g/dL — ABNORMAL LOW (ref 3.5–5.0)
Anion gap: 15 (ref 5–15)
BUN: 74 mg/dL — ABNORMAL HIGH (ref 6–20)
CO2: 22 mmol/L (ref 22–32)
Calcium: 8.8 mg/dL — ABNORMAL LOW (ref 8.9–10.3)
Chloride: 91 mmol/L — ABNORMAL LOW (ref 98–111)
Creatinine, Ser: 12.08 mg/dL — ABNORMAL HIGH (ref 0.61–1.24)
GFR, Estimated: 5 mL/min — ABNORMAL LOW (ref >60)
Glucose, Bld: 103 mg/dL — ABNORMAL HIGH (ref 70–99)
Phosphorus: 5.1 mg/dL — ABNORMAL HIGH (ref 2.5–4.6)
Potassium: 5.2 mmol/L — ABNORMAL HIGH (ref 3.5–5.1)
Sodium: 128 mmol/L — ABNORMAL LOW (ref 135–145)

## 2024-02-03 MED ORDER — HYDRALAZINE HCL 20 MG/ML IJ SOLN
INTRAMUSCULAR | Status: AC
  Filled 2024-02-03: qty 1

## 2024-02-03 MED ORDER — DIPHENHYDRAMINE HCL 50 MG/ML IJ SOLN
INTRAMUSCULAR | Status: AC
  Filled 2024-02-03: qty 1

## 2024-02-03 MED ORDER — CARVEDILOL 25 MG PO TABS
25.0000 mg | ORAL_TABLET | Freq: Two times a day (BID) | ORAL | Status: DC
  Administered 2024-02-03 – 2024-02-09 (×12): 25 mg via ORAL
  Filled 2024-02-03 (×13): qty 1

## 2024-02-03 MED ORDER — METOPROLOL TARTRATE 5 MG/5ML IV SOLN
10.0000 mg | Freq: Once | INTRAVENOUS | Status: AC
  Administered 2024-02-03: 10 mg via INTRAVENOUS

## 2024-02-03 MED ORDER — METOPROLOL TARTRATE 5 MG/5ML IV SOLN
INTRAVENOUS | Status: AC
  Filled 2024-02-03: qty 10

## 2024-02-03 MED ORDER — ACETAMINOPHEN 325 MG PO TABS
ORAL_TABLET | ORAL | Status: AC
  Filled 2024-02-03: qty 2

## 2024-02-03 MED ORDER — NITROGLYCERIN 0.4 MG SL SUBL
SUBLINGUAL_TABLET | SUBLINGUAL | Status: AC
  Filled 2024-02-03: qty 1

## 2024-02-03 MED ORDER — HEPARIN SODIUM (PORCINE) 1000 UNIT/ML IJ SOLN
INTRAMUSCULAR | Status: AC
Start: 2024-02-03 — End: 2024-02-03
  Filled 2024-02-03: qty 4

## 2024-02-03 MED ORDER — HEPARIN SODIUM (PORCINE) 1000 UNIT/ML DIALYSIS: 40.0000 [IU]/kg | INTRAMUSCULAR | Status: DC | PRN

## 2024-02-03 MED ORDER — HYDROMORPHONE HCL 1 MG/ML IJ SOLN
0.5000 mg | INTRAMUSCULAR | Status: DC | PRN
  Administered 2024-02-04 (×2): 1 mg via INTRAVENOUS
  Administered 2024-02-05: 0.5 mg via INTRAVENOUS
  Administered 2024-02-05 – 2024-02-10 (×18): 1 mg via INTRAVENOUS
  Filled 2024-02-03 (×10): qty 1
  Filled 2024-02-03: qty 0.5
  Filled 2024-02-03 (×10): qty 1

## 2024-02-03 NOTE — Progress Notes (Signed)
 Pt's case discussed with nephrologist. At this time, plan is for pt to continue in-center HD at d/c. [facility] RN had been advised that pt may need PD training at d/c and was advised today of plan to continue in-center at d/c per nephrologist. Will assist as needed.   Randine Mungo Dialysis Navigator [phone]

## 2024-02-03 NOTE — Progress Notes (Signed)
 Patient lethargic upon arrival,answers appropriately states he has  3/10 headache, tylenol  per oder given.  Bp is elevated contacted provider, new order given. . Will hold IV benadryl  per provider due to being so sleepy.

## 2024-02-03 NOTE — Progress Notes (Signed)
 Patient ID: [name], male   DOB: 11/26/1985, 38 y.o.   MRN: [id number] [facility] Progress Note   Assessment/ Plan:   1.  Hypertensive emergency: Continue efforts at trying to titrate antihypertensive therapy dosing largely limited by side effects and intolerances-yesterday uptitrated spironolactone  to 50 mg daily and will make additional changes based on post dialytic blood pressures today versus tomorrow. 2. ESRD: Continue hemodialysis on a Monday/Wednesday/Friday schedule. Atypical reaction to dialyzer with stable vital signs suggesting that this might be not quite a real dialyzer reaction however is being managed so with hypoallergenic dialyzer and premedication with Benadryl /acetaminophen .  He was evaluated by vascular surgery and found to be a suitable candidate for PD catheter placement if necessary (deferring for now).  Inquires about renal transplant. 3. Anemia: Low hemoglobin/hematocrit noted but trending upward, ESA therapy likely to be limited by his significantly elevated blood pressures. 4. CKD-MBD: Phosphorus level and calcium  level currently at goal, continue sevelamer  for phosphorus binding and Cinacalcet  for PTH control. 5. Nutrition: Continue renal diet with sodium restriction and fluid restriction.  Subjective:   Frustrated with continued elevation of blood pressure and fearful of possible reaction with dialysis later today.   Objective:   BP (!) 177/98 (BP Location: Left Arm)   Pulse 71   Temp 98 F (36.7 C) (Oral)   Resp 16   Ht 5' 8 (1.727 m)   Wt 77.8 kg   SpO2 100%   BMI 26.08 kg/m   Physical Exam: Gen: Resting comfortably in bed, appropriately engages in conversation Respiratory: Clear to auscultation bilaterally, no rales/rhonchi Cardiovascular: Pulse regular rhythm, normal rate, S1 and S2 Abd: Soft, flat, nontender, bowel sounds normal Ext: No palpable lower extremity edema.  Left arm AVG with right IJ [facility]  Labs: BMET Recent Labs   Lab 01/29/24 0747 01/30/24 0351 01/31/24 0229 02/01/24 0406 02/02/24 0947 02/03/24 0235  NA 128* 131* 134* 130* 131* 128*  K 6.0* 4.8 4.2 4.9 4.2 5.2*  CL 92* 91* 95* 93* 93* 91*  CO2 22 24 26 23  21* 22  GLUCOSE 82 91 96 93 106* 103*  BUN 74* 54* 44* 70* 55* 74*  CREATININE 13.44* 10.26* 8.57* 11.69* 10.01* 12.08*  CALCIUM  9.1 8.9 8.9 9.1 8.4* 8.8*  PHOS 5.6* 5.1* 4.8* 5.5* 4.7* 5.1*   CBC Recent Labs  Lab 01/31/24 0229 02/01/24 0406 02/02/24 0947 02/03/24 0235  WBC 4.5 6.3 5.5 5.9  HGB 8.2* 8.4* 8.7* 8.4*  HCT 26.5* 26.5* 27.6* 26.7*  MCV 90.1 88.6 89.0 88.1  PLT 206 184 193 201      Medications:     amLODipine   10 mg Oral Daily   carvedilol   12.5 mg Oral BID   Chlorhexidine  Gluconate Cloth  6 each Topical Q0600   cinacalcet   30 mg Oral Q M,W,F-1800   cloNIDine   0.3 mg Oral Q8H   clotrimazole    Topical BID   darbepoetin (ARANESP ) injection - DIALYSIS  150 mcg Subcutaneous Q Mon-1800   divalproex   500 mg Oral Daily   heparin   5,000 Units Subcutaneous Q8H   hydrALAZINE   100 mg Oral TID   hydrocortisone  cream   Topical TID   isosorbide  mononitrate  120 mg Oral Daily   losartan   100 mg Oral QPM   pantoprazole   40 mg Oral BID   sevelamer  carbonate  1,600 mg Oral TID WC   sodium zirconium cyclosilicate   10 g Oral Q M,W,F   spironolactone   50 mg Oral Daily  Gordy Blanch, MD 02/03/2024, 9:31 AM

## 2024-02-03 NOTE — Progress Notes (Signed)
 Patient at end of treatment temp 101.1, repeat tylenol   650 mg po

## 2024-02-03 NOTE — Progress Notes (Signed)
   02/03/24 0942  Assess: MEWS Score  Temp 98 F (36.7 C)  BP (!) 216/113  MAP (mmHg) 140  Pulse Rate 74  ECG Heart Rate 74  Resp 19  SpO2 100 %  O2 Device Room Air  Assess: MEWS Score  MEWS Temp 0  MEWS Systolic 2  MEWS Pulse 0  MEWS RR 0  MEWS LOC 0  MEWS Score 2  MEWS Score Color Yellow  Assess: if the MEWS score is Yellow or Red  Were vital signs accurate and taken at a resting state? Yes  Does the patient meet 2 or more of the SIRS criteria? No  MEWS guidelines implemented  Yes, yellow  Treat  MEWS Interventions Considered administering scheduled or prn medications/treatments as ordered  Take Vital Signs  Increase Vital Sign Frequency  Yellow: Q2hr x1, continue Q4hrs until patient remains green for 12hrs  Escalate  MEWS: Escalate Yellow: Discuss with charge nurse and consider notifying provider and/or RRT  Assess: SIRS CRITERIA  SIRS Temperature  0  SIRS Respirations  0  SIRS Pulse 0  SIRS WBC 0  SIRS Score Sum  0

## 2024-02-03 NOTE — Progress Notes (Signed)
 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  He suffered rigors during HD and felt that he could not get warmed.  At presentation he was found to have a blood pressure of 233/139.  He was given labetalol  and a Cardene  drip was initiated.  CT head was with no acute findings.  He was initially admitted by the [facility] service.  Goals of Care:   Code Status: Full Code   DVT prophylaxis: heparin  injection 5,000 Units Start: 01/21/24 0600 SCDs Start: 01/20/24 2230   Interim Hx: Afebrile.  Blood pressure remains poorly controlled with systolics 176-216.  Resting comfortably in dialysis at the time of my visit.  Reports that he had one of my episodes last night when not on dialysis during which he felt extremely cold and shaky despite not having a fever and being in a very warm room.  Assessment & Plan:  Hypertensive emergency-severely uncontrolled HTN -refractory HTN Dosing of multiple medications has been limited by side effects/intolerance -likely being driven primarily by inability to tolerate full consistent dialysis treatments -continue to titrate medications and attempt ongoing dialysis  Chest pain Has been evaluated by cardiology with no need for further investigation  ESRD on HD MWF S Right IJ [facility] -care per nephrology -patient has experienced multiple different reactions with attempts at ongoing dialysis -consideration is being given to transitioning to peritoneal dialysis  Chronic diastolic congestive heart failure EF 55-60% via TTE August 2025 with grade 2 diastolic dysfunction  Hard of hearing Due to Alport syndrome -able to communicate effectively using hearing  aids  Bipolar disorder with depression Continue usual Depakote   Tobacco abuse Has been counseled on absolute need to discontinue smoking   Family Communication: No family present at time of exam Disposition:   Outpatient Pt9/23/2025 1200  Objective: Blood pressure (!) 216/113, pulse 74, temperature 98 F (36.7 C), temperature source Oral, resp. rate 19, height 5' 8 (1.727 m), weight 77.8 kg, SpO2 100%.  Intake/Output Summary (Last 24 hours) at 02/03/2024 1024 Last data filed at 02/03/2024 0556 Gross per 24 hour  Intake 60 ml  Output --  Net 60 ml   Filed Weights   02/01/24 0835 02/02/24 0335 02/03/24 0337  Weight: 78 kg 75.4 kg 77.8 kg    Examination: General: No acute respiratory distress Lungs: Clear to auscultation bilaterally without wheezes or crackles Cardiovascular: Regular rate and rhythm without murmur gallop or rub normal S1 and S2 Abdomen: Nontender, nondistended, soft, bowel sounds positive, no rebound, no ascites, no appreciable mass Extremities: No significant cyanosis, clubbing, or edema bilateral lower extremities  CBC: Recent Labs  Lab 02/01/24 0406 02/02/24 0947 02/03/24 0235  WBC 6.3 5.5 5.9  HGB 8.4* 8.7* 8.4*  HCT 26.5* 27.6* 26.7*  MCV 88.6 89.0 88.1  PLT 184 193 201   Basic Metabolic Panel: Recent Labs  Lab 02/01/24 0406 02/02/24 0947 02/03/24 0235  NA 130* 131* 128*  K 4.9 4.2 5.2*  CL 93* 93* 91*  CO2 23 21* 22  GLUCOSE 93 106* 103*  BUN 70* 55* 74*  CREATININE 11.69* 10.01* 12.08*  CALCIUM  9.1 8.4* 8.8*  PHOS 5.5* 4.7* 5.1*   GFR: Estimated Creatinine Clearance: 8.2 mL/min (A) (by C-G  formula based on SCr of 12.08 mg/dL (H)).   Scheduled Meds:  amLODipine   10 mg Oral Daily   carvedilol   12.5 mg Oral BID   Chlorhexidine  Gluconate Cloth  6 each Topical Q0600   cinacalcet   30 mg Oral Q M,W,F-1800   cloNIDine   0.3 mg Oral Q8H   clotrimazole    Topical BID   darbepoetin (ARANESP ) injection - DIALYSIS  150 mcg Subcutaneous Q  Mon-1800   divalproex   500 mg Oral Daily   heparin   5,000 Units Subcutaneous Q8H   hydrALAZINE   100 mg Oral TID   hydrocortisone  cream   Topical TID   isosorbide  mononitrate  120 mg Oral Daily   losartan   100 mg Oral QPM   pantoprazole   40 mg Oral BID   sevelamer  carbonate  1,600 mg Oral TID WC   sodium zirconium cyclosilicate   10 g Oral Q M,W,F   spironolactone   50 mg Oral Daily      LOS: 14 days   Reyes IVAR Moores, MD [facility] Office  [phone] Pager - Text Page per Amion  If 7PM-7AM, please contact night-coverage per Amion 02/03/2024, 10:24 AM

## 2024-02-03 NOTE — Progress Notes (Signed)
  Received patient in bed to unit.   Informed consent signed and in chart.    TX duration: 3.75     Transported by  Hand-off given to patient's nurse.    Access used: rt CVC Access issues: none   Total UF removed: 1900 Medication(s) given: metoprolol , tylenol ,  hydralazine , Nitro SL x1 100% effective Post HD VS: 234/100     Hunter Hacking LPN [facility]

## 2024-02-04 DIAGNOSIS — I169 Hypertensive crisis, unspecified: Secondary | ICD-10-CM | POA: Diagnosis not present

## 2024-02-04 LAB — RENAL FUNCTION PANEL
Albumin: 2.9 g/dL — ABNORMAL LOW (ref 3.5–5.0)
Anion gap: 14 (ref 5–15)
BUN: 45 mg/dL — ABNORMAL HIGH (ref 6–20)
CO2: 23 mmol/L (ref 22–32)
Calcium: 8.2 mg/dL — ABNORMAL LOW (ref 8.9–10.3)
Chloride: 93 mmol/L — ABNORMAL LOW (ref 98–111)
Creatinine, Ser: 8.64 mg/dL — ABNORMAL HIGH (ref 0.61–1.24)
GFR, Estimated: 8 mL/min — ABNORMAL LOW (ref >60)
Glucose, Bld: 97 mg/dL (ref 70–99)
Phosphorus: 4 mg/dL (ref 2.5–4.6)
Potassium: 4.1 mmol/L (ref 3.5–5.1)
Sodium: 130 mmol/L — ABNORMAL LOW (ref 135–145)

## 2024-02-04 LAB — CBC
HCT: 26.7 % — ABNORMAL LOW (ref 39.0–52.0)
Hemoglobin: 8.3 g/dL — ABNORMAL LOW (ref 13.0–17.0)
MCH: 27.9 pg (ref 26.0–34.0)
MCHC: 31.1 g/dL (ref 30.0–36.0)
MCV: 89.6 fL (ref 80.0–100.0)
Platelets: 128 K/uL — ABNORMAL LOW (ref 150–400)
RBC: 2.98 MIL/uL — ABNORMAL LOW (ref 4.22–5.81)
RDW: 16.9 % — ABNORMAL HIGH (ref 11.5–15.5)
WBC: 8.4 K/uL (ref 4.0–10.5)
nRBC: 0 % (ref 0.0–0.2)

## 2024-02-04 LAB — GLUCOSE, CAPILLARY
Glucose-Capillary: 121 mg/dL — ABNORMAL HIGH (ref 70–99)
Glucose-Capillary: 94 mg/dL (ref 70–99)

## 2024-02-04 LAB — CORTISOL: Cortisol, Plasma: 16 ug/dL

## 2024-02-04 LAB — TSH: TSH: 7.973 u[IU]/mL — ABNORMAL HIGH (ref 0.350–4.500)

## 2024-02-04 MED ORDER — NIFEDIPINE ER OSMOTIC RELEASE 60 MG PO TB24
90.0000 mg | ORAL_TABLET | Freq: Two times a day (BID) | ORAL | Status: DC
  Filled 2024-02-04: qty 1

## 2024-02-04 MED ORDER — NIFEDIPINE ER OSMOTIC RELEASE 60 MG PO TB24
90.0000 mg | ORAL_TABLET | Freq: Two times a day (BID) | ORAL | Status: DC
  Administered 2024-02-04 – 2024-02-09 (×10): 90 mg via ORAL
  Filled 2024-02-04 (×13): qty 1

## 2024-02-04 MED ORDER — SODIUM CHLORIDE 0.9 % IV SOLN
12.5000 mg | Freq: Four times a day (QID) | INTRAVENOUS | Status: DC | PRN
  Administered 2024-02-09: 12.5 mg via INTRAVENOUS
  Filled 2024-02-04: qty 12.5

## 2024-02-04 MED ORDER — ACETAMINOPHEN 500 MG PO TABS
500.0000 mg | ORAL_TABLET | Freq: Four times a day (QID) | ORAL | Status: DC | PRN
  Filled 2024-02-04: qty 1

## 2024-02-04 MED ORDER — CLONIDINE HCL 0.1 MG PO TABS: 0.3000 mg | ORAL_TABLET | Freq: Three times a day (TID) | ORAL | Status: DC

## 2024-02-04 MED ORDER — DOXAZOSIN MESYLATE 4 MG PO TABS
4.0000 mg | ORAL_TABLET | Freq: Every day | ORAL | Status: DC
  Administered 2024-02-04: 4 mg via ORAL
  Filled 2024-02-04 (×2): qty 1

## 2024-02-04 MED ORDER — METOPROLOL TARTRATE 5 MG/5ML IV SOLN
5.0000 mg | Freq: Four times a day (QID) | INTRAVENOUS | Status: DC | PRN
  Administered 2024-02-05 – 2024-02-08 (×3): 5 mg via INTRAVENOUS
  Filled 2024-02-04 (×2): qty 5

## 2024-02-04 MED ORDER — NIFEDIPINE ER OSMOTIC RELEASE 60 MG PO TB24: 90.0000 mg | ORAL_TABLET | Freq: Every day | ORAL | Status: DC

## 2024-02-04 MED ORDER — BUTALBITAL-APAP-CAFFEINE 50-325-40 MG PO TABS
1.0000 | ORAL_TABLET | Freq: Four times a day (QID) | ORAL | Status: DC | PRN
Start: 2024-02-04 — End: 2024-02-10
  Administered 2024-02-04 – 2024-02-10 (×7): 1 via ORAL
  Filled 2024-02-04 (×7): qty 1

## 2024-02-04 MED ORDER — ISOSORBIDE MONONITRATE 10 MG PO TABS
20.0000 mg | ORAL_TABLET | Freq: Two times a day (BID) | ORAL | Status: DC
  Administered 2024-02-04 (×2): 20 mg via ORAL
  Filled 2024-02-04: qty 1
  Filled 2024-02-04 (×2): qty 2
  Filled 2024-02-04 (×2): qty 1

## 2024-02-04 NOTE — Progress Notes (Signed)
 PT Cancellation Note  Patient Details Name: [name] MRN: [id number] DOB: 04-01-1986   Cancelled Treatment:    Reason Eval/Treat Not Completed: (P) Medical issues which prohibited therapy (per RN, pt about to get EKG, defer PT tx at this time.) Will continue efforts per PT plan of care as schedule permits once medically appropriate.   [name] 02/04/2024, 12:51 PM

## 2024-02-04 NOTE — Progress Notes (Signed)
 Patient ID: Ellaree LITTIE Pass, male   DOB: 20-Apr-1986, 38 y.o.   MRN: [id number] [facility] Progress Note   Assessment/ Plan:   1.  Hypertensive emergency: Continue efforts at trying to titrate antihypertensive therapy dosing largely limited by side effects and intolerances.  Significantly discrepant blood pressures noted between leg and thigh; thigh blood pressures appear to be more accurate.  Overall trending down with adjustment of antihypertensive therapy including up titration of carvedilol  and spironolactone  doses. 2. ESRD: Continue hemodialysis on a Monday/Wednesday/Friday schedule-next dialysis scheduled for tomorrow. Atypical reaction to dialyzer that is empirically being managed with hypoallergenic dialyzer and premedication with Benadryl /acetaminophen .  He was evaluated by vascular surgery and found to be a suitable candidate for PD catheter placement if necessary (deferring for now because he has not had any formal education or home visit). 3. Anemia: Low hemoglobin/hematocrit noted but trending upward, ESA therapy likely to be limited by his significantly elevated blood pressures. 4. CKD-MBD: Phosphorus level and calcium  level currently at goal, continue sevelamer  for phosphorus binding and Cinacalcet  for PTH control. 5. Nutrition: Continue renal diet with sodium restriction and fluid restriction.  Subjective:   Feeling poorly this morning; CBG 121.  Discrepant blood pressure with lower leg 191/117 and thigh 157/98.  He denies any chest pain or shortness of breath.  Had a single episode of fever at the end of dialysis yesterday but none overnight.  No leukocytosis or focal signs of infection.   Objective:   BP (!) 166/92   Pulse 82   Temp 97.7 F (36.5 C) (Oral)   Resp (!) 28   Ht 5' 8 (1.727 m)   Wt 77.2 kg   SpO2 100%   BMI 25.89 kg/m   Physical Exam: Gen: Appears uncomfortable resting in bed, nurse at bedside Respiratory: Clear to auscultation bilaterally, no  rales/rhonchi Cardiovascular: Pulse regular rhythm, normal rate, S1 and S2 Abd: Soft, flat, nontender, bowel sounds normal Ext: No palpable lower extremity edema.  Left arm AVG with right IJ [facility]  Labs: BMET Recent Labs  Lab 01/29/24 0747 01/30/24 0351 01/31/24 0229 02/01/24 0406 02/02/24 0947 02/03/24 0235 02/04/24 0245  NA 128* 131* 134* 130* 131* 128* 130*  K 6.0* 4.8 4.2 4.9 4.2 5.2* 4.1  CL 92* 91* 95* 93* 93* 91* 93*  CO2 22 24 26 23  21* 22 23  GLUCOSE 82 91 96 93 106* 103* 97  BUN 74* 54* 44* 70* 55* 74* 45*  CREATININE 13.44* 10.26* 8.57* 11.69* 10.01* 12.08* 8.64*  CALCIUM  9.1 8.9 8.9 9.1 8.4* 8.8* 8.2*  PHOS 5.6* 5.1* 4.8* 5.5* 4.7* 5.1* 4.0   CBC Recent Labs  Lab 02/01/24 0406 02/02/24 0947 02/03/24 0235 02/04/24 0245  WBC 6.3 5.5 5.9 8.4  HGB 8.4* 8.7* 8.4* 8.3*  HCT 26.5* 27.6* 26.7* 26.7*  MCV 88.6 89.0 88.1 89.6  PLT 184 193 201 128*      Medications:     amLODipine   10 mg Oral Daily   carvedilol   25 mg Oral BID   Chlorhexidine  Gluconate Cloth  6 each Topical Q0600   cinacalcet   30 mg Oral Q M,W,F-1800   cloNIDine   0.3 mg Oral Q8H   clotrimazole    Topical BID   darbepoetin (ARANESP ) injection - DIALYSIS  150 mcg Subcutaneous Q Mon-1800   divalproex   500 mg Oral Daily   heparin   5,000 Units Subcutaneous Q8H   hydrALAZINE   100 mg Oral TID   hydrocortisone  cream   Topical TID   isosorbide   mononitrate  120 mg Oral Daily   losartan   100 mg Oral QPM   pantoprazole   40 mg Oral BID   sevelamer  carbonate  1,600 mg Oral TID WC   sodium zirconium cyclosilicate   10 g Oral Q M,W,F   spironolactone   50 mg Oral Daily   Gordy Blanch, MD 02/04/2024, 8:56 AM

## 2024-02-04 NOTE — Plan of Care (Signed)
  Problem: Education: Goal: Knowledge of General Education information will improve Description: Including pain rating scale, medication(s)/side effects and non-pharmacologic comfort measures Outcome: Progressing   Problem: Clinical Measurements: Goal: Will remain free from infection Outcome: Progressing Goal: Diagnostic test results will improve Outcome: Progressing   Problem: Safety: Goal: Ability to remain free from injury will improve Outcome: Progressing   Problem: Skin Integrity: Goal: Risk for impaired skin integrity will decrease Outcome: Progressing   

## 2024-02-04 NOTE — Progress Notes (Signed)
 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  He suffered rigors during HD and felt that he could not get warmed.  At presentation he was found to have a blood pressure of 233/139.  He was given labetalol  and a Cardene  drip was initiated.  CT head was with no acute findings.  He was initially admitted by the [facility] service.  Goals of Care:   Code Status: Full Code   DVT prophylaxis: heparin  injection 5,000 Units Start: 01/21/24 0600 SCDs Start: 01/20/24 2230   Interim Hx: No acute events reported overnight.  Blood pressure slightly better overnight.  It was appreciated this morning that the patient has significantly discordant blood pressures with lower leg blood pressure being 191/117 and thigh blood pressure being 157/98.  Complains of unrelenting headache today, as well as ongoing intermittent episodes of chills.  Assessment & Plan:  Hypertensive emergency-severely uncontrolled HTN -refractory HTN Dosing of multiple medications has been limited by side effects/intolerance -likely also being driven by inability to tolerate full consistent dialysis treatments -continue to titrate medications and attempt ongoing dialysis - measure BP in thigh only for consistency -aldosterone to plasma renin activity ratio not consistent with primary aldosteronism - TSH is not suppressed - has a renal artery US  in July 2025 which was negative for RAS  Chest pain Has been evaluated by Cardiology with no need for further investigation  ESRD on HD MWF Right IJ [facility] - care per nephrology - patient has experienced multiple different reactions with attempts at ongoing dialysis -consideration is being  given to transitioning to peritoneal dialysis as an outpatient  Chronic diastolic congestive heart failure EF 55-60% via TTE August 2025 with grade 2 diastolic dysfunction -volume management per dialysis  Hard of hearing Due to Alport syndrome -able to communicate effectively using hearing aids  Bipolar disorder with depression Continue usual Depakote   Tobacco abuse Has been counseled on absolute need to discontinue smoking   Family Communication: No family present at time of exam Disposition:   Outpatient Pt9/23/2025 1200 -anticipate discharge home once medically stable  Objective: Blood pressure (!) 195/85, pulse 82, temperature 97.7 F (36.5 C), temperature source Oral, resp. rate 20, height 5' 8 (1.727 m), weight 77.2 kg, SpO2 100%.  Intake/Output Summary (Last 24 hours) at 02/04/2024 0946 Last data filed at 02/03/2024 1744 Gross per 24 hour  Intake --  Output 1900 ml  Net -1900 ml   Filed Weights   02/03/24 0337 02/03/24 1317 02/04/24 0345  Weight: 77.8 kg 82.6 kg 77.2 kg    Examination: General: No acute respiratory distress Lungs: Clear to auscultation bilaterally  Cardiovascular: Regular rate and rhythm without murmur  Abdomen: Nontender, nondistended, soft, bowel sounds positive, no rebound, no ascites, no appreciable mass Extremities: No significant cyanosis, clubbing, or edema bilateral lower extremities  CBC: Recent Labs  Lab 02/02/24 0947 02/03/24 0235 02/04/24 0245  WBC 5.5 5.9 8.4  HGB 8.7* 8.4* 8.3*  HCT 27.6* 26.7* 26.7*  MCV 89.0 88.1 89.6  PLT 193 201 128*   Basic Metabolic Panel: Recent Labs  Lab 02/02/24 0947 02/03/24 0235 02/04/24 0245  NA 131* 128* 130*  K 4.2 5.2* 4.1  CL 93* 91* 93*  CO2  21* 22 23  GLUCOSE 106* 103* 97  BUN 55* 74* 45*  CREATININE 10.01* 12.08* 8.64*  CALCIUM  8.4* 8.8* 8.2*  PHOS 4.7* 5.1* 4.0   GFR: Estimated Creatinine Clearance: 11.4 mL/min (A) (by C-G formula based on SCr of 8.64 mg/dL  (H)).   Scheduled Meds:  amLODipine   10 mg Oral Daily   carvedilol   25 mg Oral BID   Chlorhexidine  Gluconate Cloth  6 each Topical Q0600   cinacalcet   30 mg Oral Q M,W,F-1800   cloNIDine   0.3 mg Oral Q8H   clotrimazole    Topical BID   darbepoetin (ARANESP ) injection - DIALYSIS  150 mcg Subcutaneous Q Mon-1800   divalproex   500 mg Oral Daily   heparin   5,000 Units Subcutaneous Q8H   hydrALAZINE   100 mg Oral TID   hydrocortisone  cream   Topical TID   isosorbide  mononitrate  120 mg Oral Daily   losartan   100 mg Oral QPM   pantoprazole   40 mg Oral BID   sevelamer  carbonate  1,600 mg Oral TID WC   sodium zirconium cyclosilicate   10 g Oral Q M,W,F   spironolactone   50 mg Oral Daily      LOS: 15 days   Reyes IVAR Moores, MD [facility]  [phone] Pager - Text Page per Amion  If 7PM-7AM, please contact night-coverage per Amion 02/04/2024, 9:46 AM

## 2024-02-05 DIAGNOSIS — A4189 Other specified sepsis: Secondary | ICD-10-CM | POA: Diagnosis not present

## 2024-02-05 DIAGNOSIS — R197 Diarrhea, unspecified: Secondary | ICD-10-CM

## 2024-02-05 DIAGNOSIS — B9689 Other specified bacterial agents as the cause of diseases classified elsewhere: Secondary | ICD-10-CM | POA: Diagnosis not present

## 2024-02-05 DIAGNOSIS — I169 Hypertensive crisis, unspecified: Secondary | ICD-10-CM | POA: Diagnosis not present

## 2024-02-05 DIAGNOSIS — L03114 Cellulitis of left upper limb: Secondary | ICD-10-CM | POA: Diagnosis not present

## 2024-02-05 DIAGNOSIS — T827XXA Infection and inflammatory reaction due to other cardiac and vascular devices, implants and grafts, initial encounter: Secondary | ICD-10-CM

## 2024-02-05 LAB — RENAL FUNCTION PANEL
Albumin: 3.2 g/dL — ABNORMAL LOW (ref 3.5–5.0)
Anion gap: 15 (ref 5–15)
BUN: 70 mg/dL — ABNORMAL HIGH (ref 6–20)
CO2: 23 mmol/L (ref 22–32)
Calcium: 8.5 mg/dL — ABNORMAL LOW (ref 8.9–10.3)
Chloride: 91 mmol/L — ABNORMAL LOW (ref 98–111)
Creatinine, Ser: 12 mg/dL — ABNORMAL HIGH (ref 0.61–1.24)
GFR, Estimated: 5 mL/min — ABNORMAL LOW (ref >60)
Glucose, Bld: 113 mg/dL — ABNORMAL HIGH (ref 70–99)
Phosphorus: 5.2 mg/dL — ABNORMAL HIGH (ref 2.5–4.6)
Potassium: 4.4 mmol/L (ref 3.5–5.1)
Sodium: 129 mmol/L — ABNORMAL LOW (ref 135–145)

## 2024-02-05 LAB — BLOOD CULTURE ID PANEL (REFLEXED) - BCID2

## 2024-02-05 LAB — CBC
HCT: 28.3 % — ABNORMAL LOW (ref 39.0–52.0)
Hemoglobin: 9 g/dL — ABNORMAL LOW (ref 13.0–17.0)
MCH: 27.8 pg (ref 26.0–34.0)
MCHC: 31.8 g/dL (ref 30.0–36.0)
MCV: 87.3 fL (ref 80.0–100.0)
Platelets: 145 K/uL — ABNORMAL LOW (ref 150–400)
RBC: 3.24 MIL/uL — ABNORMAL LOW (ref 4.22–5.81)
RDW: 16.3 % — ABNORMAL HIGH (ref 11.5–15.5)
WBC: 8.1 K/uL (ref 4.0–10.5)
nRBC: 0 % (ref 0.0–0.2)

## 2024-02-05 MED ORDER — SODIUM CHLORIDE 0.9 % IV SOLN
2.0000 g | Freq: Once | INTRAVENOUS | Status: AC
  Administered 2024-02-05: 2 g via INTRAVENOUS
  Filled 2024-02-05: qty 12.5

## 2024-02-05 MED ORDER — SODIUM CHLORIDE 0.9 % IV SOLN
2.0000 g | INTRAVENOUS | Status: DC
  Administered 2024-02-08 – 2024-02-10 (×2): 2 g via INTRAVENOUS
  Filled 2024-02-05 (×2): qty 12.5

## 2024-02-05 MED ORDER — HEPARIN SODIUM (PORCINE) 1000 UNIT/ML IJ SOLN
INTRAMUSCULAR | Status: AC
  Filled 2024-02-05: qty 4

## 2024-02-05 MED ORDER — HEPARIN SODIUM (PORCINE) 1000 UNIT/ML DIALYSIS
40.0000 [IU]/kg | INTRAMUSCULAR | Status: DC | PRN
  Administered 2024-02-05: 3100 [IU] via INTRAVENOUS_CENTRAL
  Administered 2024-02-05: 3200 [IU] via INTRAVENOUS_CENTRAL

## 2024-02-05 MED ORDER — CEFTRIAXONE SODIUM 2 G IJ SOLR
2.0000 g | INTRAMUSCULAR | Status: DC
  Administered 2024-02-05: 2 g via INTRAVENOUS
  Filled 2024-02-05 (×2): qty 20

## 2024-02-05 MED ORDER — DOXAZOSIN MESYLATE 8 MG PO TABS
8.0000 mg | ORAL_TABLET | Freq: Every day | ORAL | Status: DC
  Administered 2024-02-06 – 2024-02-10 (×5): 8 mg via ORAL
  Filled 2024-02-05 (×5): qty 1

## 2024-02-05 MED ORDER — DIPHENHYDRAMINE HCL 25 MG PO CAPS
ORAL_CAPSULE | ORAL | Status: AC
  Filled 2024-02-05: qty 2

## 2024-02-05 MED ORDER — ISOSORBIDE MONONITRATE 10 MG PO TABS
20.0000 mg | ORAL_TABLET | Freq: Two times a day (BID) | ORAL | Status: DC
  Administered 2024-02-05 – 2024-02-09 (×6): 20 mg via ORAL
  Filled 2024-02-05 (×2): qty 2
  Filled 2024-02-05: qty 1
  Filled 2024-02-05 (×7): qty 2

## 2024-02-05 MED ORDER — ONDANSETRON HCL 4 MG/2ML IJ SOLN
INTRAMUSCULAR | Status: AC
  Filled 2024-02-05: qty 2

## 2024-02-05 MED ORDER — HYDROMORPHONE HCL 1 MG/ML IJ SOLN
INTRAMUSCULAR | Status: AC
  Filled 2024-02-05: qty 1

## 2024-02-05 MED ORDER — DIPHENHYDRAMINE HCL 25 MG PO CAPS
50.0000 mg | ORAL_CAPSULE | Freq: Once | ORAL | Status: AC
  Administered 2024-02-05: 50 mg via ORAL

## 2024-02-05 NOTE — Progress Notes (Signed)
 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  He suffered rigors during HD and felt that he could not get warmed.  At presentation he was found to have a blood pressure of 233/139.  He was given labetalol  and a Cardene  drip was initiated.  CT head was with no acute findings.  He was initially admitted by the [facility] service.  Goals of Care:   Code Status: Full Code   DVT prophylaxis: heparin  injection 5,000 Units Start: 01/21/24 0600 SCDs Start: 01/20/24 2230   Interim Hx: Reports significant dyspepsia and dialysis this morning.  Afebrile.  Blood pressure remains elevated but has improved overall.  Vitals otherwise stable.  Reports recurring episodes of chills intermittent.  Headache has improved.  Assessment & Plan:  Hypertensive emergency - severely uncontrolled HTN - refractory HTN Dosing of multiple medications has been limited by side effects/intolerance -likely also being driven by inability to tolerate full consistent dialysis treatments -continue to titrate medications and attempt ongoing dialysis - measure BP in thigh only for consistency -aldosterone to plasma renin activity ratio not consistent with primary aldosteronism - TSH is not suppressed - has a renal artery US  in July 2025 which was negative for RAS  Klebsiella oxytoca bacteremia Noted on 2/2 blood cultures - blood cultures obtained due to recurring chills - no signif fever or elevated WBC has been noted - ID consulted   Chest pain Has been evaluated by Cardiology with no need for further investigation -   ESRD on HD MWF Right IJ [facility] - care per Nephrology - patient has experienced multiple different  reactions with attempts at ongoing dialysis - consideration is being given to transitioning to peritoneal dialysis as an outpatient  Chronic diastolic congestive heart failure EF 55-60% via TTE August 2025 with grade 2 diastolic dysfunction -volume management per dialysis  Hard of hearing Due to Alport syndrome -able to communicate effectively using hearing aids  Bipolar disorder with depression Continue usual Depakote   Tobacco abuse Has been counseled on absolute need to discontinue smoking   Family Communication: No family present at time of exam Disposition:   Outpatient Pt9/23/2025 1200 -anticipate discharge home once medically stable  Objective: Blood pressure (!) 174/96, pulse 69, temperature 97.8 F (36.6 C), temperature source Oral, resp. rate 13, height 5' 8 (1.727 m), weight 77.4 kg, SpO2 100%.  Intake/Output Summary (Last 24 hours) at 02/05/2024 1037 Last data filed at 02/05/2024 0600 Gross per 24 hour  Intake 720 ml  Output --  Net 720 ml   Filed Weights   02/04/24 0345 02/05/24 0711 02/05/24 0908  Weight: 77.2 kg 78.2 kg 77.4 kg    Examination: General: No acute respiratory distress -alert and conversant Lungs: Clear to auscultation bilaterally  Cardiovascular: Regular rate and rhythm without murmur  Abdomen: NT/ND, soft, BS positive, no rebound Extremities: No significant edema bilateral lower extremities  CBC: Recent Labs  Lab 02/03/24 0235 02/04/24 0245 02/05/24 0813  WBC 5.9 8.4 8.1  HGB 8.4* 8.3* 9.0*  HCT 26.7* 26.7* 28.3*  MCV 88.1 89.6 87.3  PLT 201 128* 145*   Basic Metabolic Panel: Recent Labs  Lab 02/03/24 0235 02/04/24 0245 02/05/24 0813  NA 128* 130* 129*  K 5.2* 4.1 4.4  CL 91* 93* 91*  CO2 22 23 23   GLUCOSE 103* 97 113*  BUN 74* 45* 70*  CREATININE 12.08* 8.64* 12.00*  CALCIUM  8.8* 8.2* 8.5*  PHOS 5.1* 4.0 5.2*   GFR: Estimated Creatinine Clearance: 8.2 mL/min (A) (by C-G formula based on SCr of 12 mg/dL  (H)).   Scheduled Meds:  carvedilol   25 mg Oral BID   Chlorhexidine  Gluconate Cloth  6 each Topical Q0600   cinacalcet   30 mg Oral Q M,W,F-1800   clotrimazole    Topical BID   darbepoetin (ARANESP ) injection - DIALYSIS  150 mcg Subcutaneous Q Mon-1800   divalproex   500 mg Oral Daily   doxazosin   4 mg Oral Daily   heparin   5,000 Units Subcutaneous Q8H   hydrALAZINE   100 mg Oral TID   hydrocortisone  cream   Topical TID   isosorbide  mononitrate  20 mg Oral BID   losartan   100 mg Oral QPM   NIFEdipine   90 mg Oral BID   pantoprazole   40 mg Oral BID   sevelamer  carbonate  1,600 mg Oral TID WC   sodium zirconium cyclosilicate   10 g Oral Q M,W,F   spironolactone   50 mg Oral Daily      LOS: 16 days   Reyes IVAR Moores, MD [facility]  [phone] Pager - Text Page per Amion  If 7PM-7AM, please contact night-coverage per Amion 02/05/2024, 10:37 AM

## 2024-02-05 NOTE — Progress Notes (Signed)
 Received patient in bed to unit.  Alert and oriented.  Informed consent signed and in chart.   TX duration:3 hours and 45 minutes  Patient tolerated well.  Transported back to the room  Alert, without acute distress.  Hand-off given to patient's nurse.   Access used: Right internal jugular HD cath Access issues:   Total UF removed: 2L Medication(s) given: Dilaudid , Benedryl, Zofran , TUMS   02/05/24 1333  Vitals  Temp 98.4 F (36.9 C)  BP (!) 174/92  MAP (mmHg) 113  Pulse Rate 78  ECG Heart Rate 80  Resp 15  Weight 75.4 kg  Type of Weight Post-Dialysis  Oxygen Therapy  SpO2 94 %  O2 Device Room Air  During Treatment Monitoring  Duration of HD Treatment -hour(s) 3.75 hour(s)  HD Safety Checks Performed Yes  Intra-Hemodialysis Comments Tx initiated  Post Treatment  Dialyzer Clearance Clear  Liters Processed 89.9  Fluid Removed (mL) 2000 mL  Tolerated HD Treatment Yes  Hemodialysis Catheter Right Internal jugular Double lumen Permanent (Tunneled)  Placement Date/Time: 04/03/23 1228   Serial / Lot #: [id number]  Expiration Date: 10/10/27  Time Out: Correct patient;Correct site;Correct procedure  Maximum sterile barrier precautions: Hand hygiene;Cap;Mask;Sterile gown;Sterile gloves;Large sterile s...  Site Condition No complications  Blue Lumen Status Flushed;Antimicrobial dead end cap;Heparin  locked  Red Lumen Status Infusing;Antimicrobial dead end cap;Heparin  locked  Purple Lumen Status N/A  Catheter fill solution Heparin  1000 units/ml  Catheter fill volume (Arterial) 1.6 cc  Catheter fill volume (Venous) 1.6  Dressing Type Transparent  Dressing Status Antimicrobial disc/dressing in place;Clean, Dry, Intact  Drainage Description None  Dressing Change Due 02/11/24  Post treatment catheter status Capped and Clamped     Camellia Brasil LPN [facility]

## 2024-02-05 NOTE — Progress Notes (Addendum)
 PHARMACY - PHYSICIAN COMMUNICATION CRITICAL VALUE ALERT - BLOOD CULTURE IDENTIFICATION (BCID)  [name] is an 38 y.o. male who presented to [facility] on 01/20/2024 with a chief complaint of generalized weakness, chills, body aches.   Assessment:  GNR 2/4 bottles, BCID = Klebsiella oxytoca (without any resistance detected)  Name of physician (or Provider) Contacted: Dr. Danton  Current antibiotics: none  Changes to prescribed antibiotics recommended:  - Ceftriaxone  2g q24h per protocol - Provider notified via secure chat and is aware  Results for orders placed or performed during the hospital encounter of 01/20/24  Blood Culture ID Panel (Reflexed) (Collected: 02/04/2024  5:35 PM)  Result Value Ref Range   Enterococcus faecalis NOT DETECTED NOT DETECTED   Enterococcus Faecium NOT DETECTED NOT DETECTED   Listeria monocytogenes NOT DETECTED NOT DETECTED   Staphylococcus species NOT DETECTED NOT DETECTED   Staphylococcus aureus (BCID) NOT DETECTED NOT DETECTED   Staphylococcus epidermidis NOT DETECTED NOT DETECTED   Staphylococcus lugdunensis NOT DETECTED NOT DETECTED   Streptococcus species NOT DETECTED NOT DETECTED   Streptococcus agalactiae NOT DETECTED NOT DETECTED   Streptococcus pneumoniae NOT DETECTED NOT DETECTED   Streptococcus pyogenes NOT DETECTED NOT DETECTED   A.calcoaceticus-baumannii NOT DETECTED NOT DETECTED   Bacteroides fragilis NOT DETECTED NOT DETECTED   Enterobacterales DETECTED (A) NOT DETECTED   Enterobacter cloacae complex NOT DETECTED NOT DETECTED   Escherichia coli NOT DETECTED NOT DETECTED   Klebsiella aerogenes NOT DETECTED NOT DETECTED   Klebsiella oxytoca DETECTED (A) NOT DETECTED   Klebsiella pneumoniae NOT DETECTED NOT DETECTED   Proteus species NOT DETECTED NOT DETECTED   Salmonella species NOT DETECTED NOT DETECTED   Serratia marcescens NOT DETECTED NOT DETECTED   Haemophilus influenzae NOT DETECTED NOT DETECTED   Neisseria meningitidis  NOT DETECTED NOT DETECTED   Pseudomonas aeruginosa NOT DETECTED NOT DETECTED   Stenotrophomonas maltophilia NOT DETECTED NOT DETECTED   Candida albicans NOT DETECTED NOT DETECTED   Candida auris NOT DETECTED NOT DETECTED   Candida glabrata NOT DETECTED NOT DETECTED   Candida krusei NOT DETECTED NOT DETECTED   Candida parapsilosis NOT DETECTED NOT DETECTED   Candida tropicalis NOT DETECTED NOT DETECTED   Cryptococcus neoformans/gattii NOT DETECTED NOT DETECTED   CTX-M ESBL NOT DETECTED NOT DETECTED   Carbapenem resistance IMP NOT DETECTED NOT DETECTED   Carbapenem resistance KPC NOT DETECTED NOT DETECTED   Carbapenem resistance NDM NOT DETECTED NOT DETECTED   Carbapenem resist OXA 48 LIKE NOT DETECTED NOT DETECTED   Carbapenem resistance VIM NOT DETECTED NOT DETECTED    Feliciano Close, PharmD PGY2 Infectious Diseases Pharmacy Resident  02/05/2024 12:35 PM

## 2024-02-05 NOTE — Procedures (Signed)
 Patient seen on Hemodialysis. BP (!) 174/96   Pulse 69   Temp 97.8 F (36.6 C) (Oral)   Resp 13   Ht 5' 8 (1.727 m)   Wt 77.4 kg Comment: bed  SpO2 100%   BMI 25.95 kg/m   QB 400, UF goal 2L Complains of reflux- CaCO3 suspension ordered.   Gordy Blanch MD [facility] Office # [phone] Pager # [phone] 10:07 AM

## 2024-02-05 NOTE — Consult Note (Signed)
 [facility]    Date of Admission:  01/20/2024     Reason for Consult: bacteremia    Referring Provider: Reyes Grandchild     Lines:  03/27/23 - current Right chest tunneled hd catheter  Left arm avg  Abx: 9/26-c cefepime   9/26 one dose ceftriaxone         Assessment: 38 y.o. male with Alport syndrome with hearing loss and esrd on iHD, uncontrolled htn, HFpEF, bipolar 1, tobacco use, (echo severe concentric lvh), admitted 9/10 for hypertensive urgency/emergency, course complicated by nosocomial sepsis found to have klebsiella oxytoca bacteremia  Patient has avf placed 10/21/23 and revision due to ?blood clot 11/24/23 at [facility]. He developed a cellulitis process post revision and given 10 day abx vanc/cefepime  -> vanc alone. Chart mention no fluid collection in that area at that time  There was some red herring that if he has allergy to dialysis membrane and a hypoallergenic membrane has been used. He has been dializing through the right chest HD line. Initial blood cx negative  There was still clot in the left avf and now a new fluid collection. No sampling has been done. Fistulogram shows patency of avf (native tissue). He still has discomfort over that area  Other red herring is the intermittent diarrhea he has here  9/24 true sepsis occurred 9/25 bcx klebsiella oxytoca    Source --> ?hd line, avf, gi   We are not able to do diagnostic blood cx sampling from the line and peripheral at the same time any more. He'll be getting abx as well so question sensitivity  For now plan is to make sure the given the cellulitis he has and multiple manipulation of the left avf and new fluid collection, I would try to push for sampling of the hematoma if possible if not I&D. Undersanding that he has already received abx  We can also treat for 2 weeks and see if he develops recurrent bacteremia of the same and then would really need to remove the hd  line, and if the left avf site not fully investigated now likely will need to be then   Plan: To make abx administration simplified (he has peripheral iv discomfort at the time I was seeing), change abx to cefepime  to be given three times per week with dialysis Would ask vascular surgery to see if sampling of hematoma can be done or even I&D.SABRASABRA. patient has infection over that area mid 11/2023 which at that time no fluid collection was present Repeat blood cx If repeat bcx is positive here the line need to be removed and the ask for the fluid collection to be analyzed carries even more weight If repeat bcx negative and after 2 weeks abx tx he recurs then we are back to item #2 if not already done, and for the hd line to be removed, and echocardiogram tee Maintain standard isolation precaution Discussed with primary team      ------------------------------------------------ Principal Problem:   Hypertensive crisis    HPI: [name] is a 38 y.o. male with Alport syndrome with hearing loss and esrd on iHD, uncontrolled htn, HFpEF, bipolar 1, tobacco use, (echo severe concentric lvh), admitted 9/10 for hypertensive urgency/emergency, course complicated by nosocomial sepsis found to have klebsiella oxytoca bacteremia  Hx via patient's report, chart  Recent covid infection/admission 12/26/23 positive covid pcr  9/10 came to er with weakness, body ache, chills, facial numbness x2weeks along  with malaise (again in setting recent covid infection). Negative covid repeat testing. No chest pain/sob. Some diarrhea. Bp 130/130s treated in icu with nicardipine  gtt. Head ct unremarkable. A set of blood cx done and was negative.  He had no actual fever or leukocytosis at that time  Currently on losartan , hydralazine , clonidine , amlodipine , and coreg    He has a left native brachial-basilic avf placed at [facility] on 10/21/23 11/24/23 avf revision and developed post-operative cellulitis (bcx  negative). U/s of the avf site showed no hematoma/fluid collection. 10 days vancomycin  total eot 7/28  Dialysis was attempted on the left avg that was recently placed at [facility], but it was clotted and there was pain.  9/12 u/s confirmed the complex mass/fluid collection but patent graft. Vascular surgery consulted on 9/16. The graft was patent but low flows by duplex. The swelling/fluid thought to be hematoma. There is some initial concern the avg site might be infected. No sampling was done of the fluid collection   9/17 underwent fistulogram by vascular No flow-limiting stenosis within the left arm AV graft. Widely patent anastomosis both proximally and distally. No outflow stenosis.   Vascular surgery recommend graft rest due to hematoma/seroma and f/u vascular surgery at [facility]. Vascular surgery didn't suspect infected fluid collection.   Of note, nephrology suspect patient has some kind of allergic reaction to dialysis membrane given chills with dialysis so has been using a hypoallergenic membrane    There is request for vascular surgery to evaluate patient for a PD   He did have intermittent diarrhea this admission too    9/24 hospital onset sepsis 9/25 bcx gnr (kleb oxytoca no bcid resistant gene) Id consulted   Other id hx: 03/2023 corynebacterium striatum bacteremia; has temp HD cath removed and replaced     Family History  Problem Relation Age of Onset   Hypertension Mother    Kidney failure Mother    Diabetes Mother    Hearing loss Father    Hypertension Sister    Heart disease Maternal Grandmother    Stroke Maternal Grandfather    Asthma Son     Social History   Tobacco Use   Smoking status: Every Day    Current packs/day: 0.00    Types: Cigarettes    Last attempt to quit: 02/27/2016    Years since quitting: 7.9   Smokeless tobacco: Never   Tobacco comments:    Returned to smoking 2 black and milds per day  Vaping Use   Vaping status:  Never Used  Substance Use Topics   Alcohol use: Not Currently    Comment: rare   Drug use: Yes    Types: Marijuana    Allergies  Allergen Reactions   Nsaids Other (See Comments)    Contraindication due to ckd    Zestril [Lisinopril] Swelling   Risperdal  [Risperidone ] Other (See Comments)    Unknown reaction     Review of Systems: ROS All Other ROS was negative, except mentioned above   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   Heart murmur  Hypertension    Low blood sugar    Marijuana abuse    Noncompliance    Pericardial effusion 03/28/2023   Tobacco abuse        Scheduled Meds:  carvedilol   25 mg Oral BID   Chlorhexidine  Gluconate Cloth  6 each Topical Q0600   cinacalcet   30 mg Oral Q M,W,F-1800   clotrimazole    Topical BID   darbepoetin (ARANESP ) injection - DIALYSIS  150 mcg Subcutaneous Q Mon-1800   divalproex   500 mg Oral Daily   [START ON 02/06/2024] doxazosin   8 mg Oral Daily   heparin   5,000 Units Subcutaneous Q8H   hydrALAZINE   100 mg Oral TID   hydrocortisone  cream   Topical TID   isosorbide  mononitrate  20 mg Oral BID   losartan   100 mg Oral QPM   NIFEdipine   90 mg Oral BID   pantoprazole   40 mg Oral BID   sevelamer  carbonate  1,600 mg Oral TID WC   sodium zirconium cyclosilicate   10 g Oral Q M,W,F   spironolactone   50 mg Oral Daily   Continuous Infusions:  ceFEPime  (MAXIPIME ) IV     [START ON 02/08/2024] ceFEPime  (MAXIPIME ) IV     promethazine  (PHENERGAN ) injection (IM or IVPB)     PRN Meds:.acetaminophen , butalbital -acetaminophen -caffeine , calcium  carbonate, docusate sodium , HYDROmorphone  (DILAUDID ) injection, loperamide ,  metoprolol  tartrate, nitroGLYCERIN , ondansetron  (ZOFRAN ) IV, ondansetron , mouth rinse, oxyCODONE , polyethylene glycol, promethazine  (PHENERGAN ) injection (IM or IVPB)   OBJECTIVE: Blood pressure (!) 173/93, pulse 82, temperature 98.4 F (36.9 C), resp. rate 16, height 5' 8 (1.727 m), weight 75.4 kg, SpO2 93%.  Physical Exam  General/constitutional: no distress, pleasant HEENT: Normocephalic, PER, Conj Clear, EOMI, Oropharynx clear Neck supple CV: rrr no mrg Lungs: clear to auscultation, normal respiratory effort Abd: Soft, Nontender Ext: no edema Skin: No Rash Neuro: nonfocal MSK: no peripheral joint swelling/tenderness/warmth; back spines nontender   Central line presence: left ue avf site dressign c/d/I; mild tenderness on palpation; right chest hd line site no purulence/tenderness   Lab Results Lab Results  Component Value Date   WBC 8.1 02/05/2024   HGB 9.0 (L) 02/05/2024   HCT 28.3 (L) 02/05/2024   MCV 87.3 02/05/2024   PLT 145 (L) 02/05/2024    Lab Results  Component Value Date   CREATININE 12.00 (H) 02/05/2024   BUN 70 (H) 02/05/2024   NA 129 (L) 02/05/2024   K 4.4 02/05/2024   CL 91 (L) 02/05/2024   CO2 23 02/05/2024    Lab Results  Component Value Date   ALT 13 01/20/2024   AST 26 01/20/2024   ALKPHOS 139 (H) 01/20/2024   BILITOT 0.9 01/20/2024      Microbiology: Recent Results (from the past 240 hours)  Culture, blood (Routine X 2) w Reflex to ID Panel     Status: None (Preliminary result)   Collection Time: 02/04/24  5:35 PM   Specimen: BLOOD RIGHT FOREARM  Result Value Ref Range Status   Specimen Description BLOOD RIGHT FOREARM  Final   Special Requests   Final    BOTTLES DRAWN AEROBIC AND ANAEROBIC Blood Culture adequate volume   Culture  Setup Time   Final    GRAM NEGATIVE RODS IN BOTH AEROBIC AND ANAEROBIC BOTTLES CRITICAL RESULT CALLED TO, READ BACK BY AND VERIFIED WITH: MAYA WENDI CLOSE [id number] AT 1220, ADC Performed at [facility], [address], [place], [place] [address]    Culture GRAM NEGATIVE RODS  Final   Report  Status PENDING  Incomplete  Blood Culture ID Panel (Reflexed)     Status: Abnormal   Collection Time: 02/04/24  5:35 PM  Result Value Ref Range Status   Enterococcus faecalis NOT DETECTED NOT DETECTED Final   Enterococcus Faecium NOT DETECTED NOT DETECTED Final   Listeria monocytogenes NOT DETECTED NOT DETECTED Final   Staphylococcus species NOT DETECTED NOT DETECTED Final   Staphylococcus aureus (BCID) NOT DETECTED NOT DETECTED Final   Staphylococcus epidermidis NOT DETECTED NOT DETECTED Final   Staphylococcus lugdunensis NOT DETECTED NOT DETECTED Final   Streptococcus species NOT DETECTED NOT DETECTED Final   Streptococcus agalactiae NOT DETECTED NOT DETECTED Final   Streptococcus pneumoniae NOT DETECTED NOT DETECTED Final   Streptococcus pyogenes NOT DETECTED NOT DETECTED Final   A.calcoaceticus-baumannii NOT DETECTED NOT DETECTED Final   Bacteroides fragilis NOT DETECTED NOT DETECTED Final   Enterobacterales DETECTED (A) NOT DETECTED Final    Comment: Enterobacterales represent a large order of gram negative bacteria, not a single organism. CRITICAL RESULT CALLED TO, READ BACK BY AND VERIFIED WITH: PHARMD BSABRA CLOSE [id number] AT 1220, ADC    Enterobacter cloacae complex NOT DETECTED NOT DETECTED Final   Escherichia coli NOT DETECTED NOT DETECTED Final   Klebsiella aerogenes NOT DETECTED NOT DETECTED Final   Klebsiella oxytoca DETECTED (A) NOT DETECTED Final    Comment: CRITICAL RESULT CALLED TO, READ BACK BY AND VERIFIED WITH: PHARMD BSABRA CLOSE [id number] AT 1220, ADC    Klebsiella pneumoniae NOT DETECTED NOT DETECTED Final   Proteus species NOT DETECTED NOT DETECTED Final   Salmonella species NOT DETECTED NOT DETECTED Final   Serratia marcescens NOT DETECTED NOT DETECTED Final   Haemophilus influenzae NOT DETECTED NOT DETECTED Final   Neisseria meningitidis NOT DETECTED NOT DETECTED  Final   Pseudomonas aeruginosa NOT DETECTED NOT DETECTED Final   Stenotrophomonas maltophilia NOT DETECTED NOT DETECTED Final   Candida albicans NOT DETECTED NOT DETECTED Final   Candida auris NOT DETECTED NOT DETECTED Final   Candida glabrata NOT DETECTED NOT DETECTED Final   Candida krusei NOT DETECTED NOT DETECTED Final   Candida parapsilosis NOT DETECTED NOT DETECTED Final   Candida tropicalis NOT DETECTED NOT DETECTED Final   Cryptococcus neoformans/gattii NOT DETECTED NOT DETECTED Final   CTX-M ESBL NOT DETECTED NOT DETECTED Final   Carbapenem resistance IMP NOT DETECTED NOT DETECTED Final   Carbapenem resistance KPC NOT DETECTED NOT DETECTED Final   Carbapenem resistance NDM NOT DETECTED NOT DETECTED Final   Carbapenem resist OXA 48 LIKE NOT DETECTED NOT DETECTED Final   Carbapenem resistance VIM NOT DETECTED NOT DETECTED Final    Comment: Performed at [facility], [address] [place], [place] [address]  Culture, blood (Routine X 2) w Reflex to ID Panel     Status: None (Preliminary result)   Collection Time: 02/04/24  5:50 PM   Specimen: BLOOD RIGHT HAND  Result Value Ref Range Status   Specimen Description BLOOD RIGHT HAND  Final   Special Requests   Final    BOTTLES DRAWN AEROBIC AND ANAEROBIC Blood Culture adequate volume   Culture  Setup Time   Final    IN BOTH AEROBIC AND ANAEROBIC BOTTLES CRITICAL VALUE NOTED.  VALUE IS CONSISTENT WITH PREVIOUSLY REPORTED AND CALLED VALUE. Performed at [facility], [address], [place], [place] [address]    Culture GRAM NEGATIVE RODS  Final   Report Status PENDING  Incomplete     Serology:    Imaging: If  present, new imagings (plain films, ct scans, and mri) have been personally visualized and interpreted; radiology reports have been reviewed. Decision making incorporated into the Impression / Recommendations.   9/20 tte limited IMPRESSIONS  Limited echo for pericardial effusion    1. Left ventricular  ejection fraction, by estimation, is 60 to 65%. Left  ventricular ejection fraction by PLAX is 65 %. The left ventricle has  normal function. The left ventricle has no regional wall motion  abnormalities. There is moderate left  ventricular hypertrophy.   2. There is no evidence of cardiac tamponade.   3. The aortic valve is tricuspid. Aortic valve regurgitation is not  visualized.   4. Moderately dilated pulmonary artery.   5. There is normal pulmonary artery systolic pressure. The estimated  right ventricular systolic pressure is 28.8 mmHg.   6. The inferior vena cava is normal in size with greater than 50%  respiratory variability, suggesting right atrial pressure of 3 mmHg.   7. Trivial circumferential pericardial effusion.   Comparison(s): Changes from prior study are noted. 12/28/2023: LVEF 55-60%,  severe LVH, small pericardial effusion near the RV. Compared to this prior  study, the pericardial effusion is smaller (now trivial in size).    9/16 xray chest FINDINGS: Right chest wall dialysis catheter with tip overlying the right atrium.   The heart and mediastinal contours are within normal limits.   Right base atelectasis. No focal consolidation. No pulmonary edema. No pleural effusion. No pneumothorax.   No acute osseous abnormality.   IMPRESSION: No active disease.    9/12 duplex u/s lue Complex structure note in the medial middle to distal upper arm measuring  5.4 x 2.7 x 3.6 cm.  Patent arteriovenous graft.     9/10 ct head IMPRESSION: 1. No acute intracranial process.    Edward ONEIDA Passer, MD [facility] [phone] pager    02/05/2024, 3:58 PM

## 2024-02-05 NOTE — Progress Notes (Signed)
 Patient ID: [name], male   DOB: Aug 31, 1985, 38 y.o.   MRN: [id number] [facility] Progress Note   Assessment/ Plan:   1.  Hypertensive emergency: Continue efforts at trying to titrate antihypertensive therapy dosing largely limited by side effects and intolerances.  Will continue to check blood pressures in his left thigh for consistency; elevated blood pressure noted this morning and will follow-up with hemodialysis/UF.  No evidence of secondary hypertension with ongoing titration of antihypertensive therapy. 2. ESRD: Continue hemodialysis on a Monday/Wednesday/Friday schedule-next dialysis scheduled for today. Atypical reaction to dialyzer that is empirically being managed with hypoallergenic dialyzer and premedication with Benadryl /acetaminophen .  He was evaluated by vascular surgery and found to be a suitable candidate for PD catheter placement if necessary (deferring for now because he has not had any formal education or home visit). 3. Anemia: Low hemoglobin/hematocrit noted but trending upward, ESA therapy likely to be limited by his significantly elevated blood pressures. 4. CKD-MBD: Phosphorus level and calcium  level currently at goal, continue sevelamer  for phosphorus binding and Cinacalcet  for PTH control. 5. Nutrition: Continue renal diet with sodium restriction and fluid restriction.  Subjective:   Complains of pain in his right leg that limits mobility.  Previous imaging unrevealing   Objective:   BP (!) 181/118 (BP Location: Left Leg)   Pulse 74   Temp 97.7 F (36.5 C) (Oral)   Resp 17   Ht 5' 8 (1.727 m)   Wt 78.2 kg   SpO2 98%   BMI 26.21 kg/m   Physical Exam: Gen: Comfortably sitting up on the edge of his bed, getting ready to go to dialysis Respiratory: Clear to auscultation bilaterally, no rales/rhonchi Cardiovascular: Pulse regular rhythm, normal rate, S1 and S2 Abd: Soft, flat, nontender, bowel sounds normal Ext: Trace bilateral ankle edema,  tenderness over right leg.  Left arm AVG with right IJ [facility]  Labs: BMET Recent Labs  Lab 01/30/24 0351 01/31/24 0229 02/01/24 0406 02/02/24 0947 02/03/24 0235 02/04/24 0245  NA 131* 134* 130* 131* 128* 130*  K 4.8 4.2 4.9 4.2 5.2* 4.1  CL 91* 95* 93* 93* 91* 93*  CO2 24 26 23  21* 22 23  GLUCOSE 91 96 93 106* 103* 97  BUN 54* 44* 70* 55* 74* 45*  CREATININE 10.26* 8.57* 11.69* 10.01* 12.08* 8.64*  CALCIUM  8.9 8.9 9.1 8.4* 8.8* 8.2*  PHOS 5.1* 4.8* 5.5* 4.7* 5.1* 4.0   CBC Recent Labs  Lab 02/02/24 0947 02/03/24 0235 02/04/24 0245 02/05/24 0813  WBC 5.5 5.9 8.4 8.1  HGB 8.7* 8.4* 8.3* 9.0*  HCT 27.6* 26.7* 26.7* 28.3*  MCV 89.0 88.1 89.6 87.3  PLT 193 201 128* 145*      Medications:     carvedilol   25 mg Oral BID   Chlorhexidine  Gluconate Cloth  6 each Topical Q0600   cinacalcet   30 mg Oral Q M,W,F-1800   clotrimazole    Topical BID   darbepoetin (ARANESP ) injection - DIALYSIS  150 mcg Subcutaneous Q Mon-1800   diphenhydrAMINE   50 mg Oral Once   divalproex   500 mg Oral Daily   doxazosin   4 mg Oral Daily   heparin   5,000 Units Subcutaneous Q8H   hydrALAZINE   100 mg Oral TID   hydrocortisone  cream   Topical TID   isosorbide  mononitrate  20 mg Oral BID   losartan   100 mg Oral QPM   NIFEdipine   90 mg Oral BID   pantoprazole   40 mg Oral BID   sevelamer  carbonate  1,600 mg Oral TID WC   sodium zirconium cyclosilicate   10 g Oral Q M,W,F   spironolactone   50 mg Oral Daily   Gordy Blanch, MD 02/05/2024, 8:38 AM

## 2024-02-06 ENCOUNTER — Inpatient Hospital Stay (HOSPITAL_COMMUNITY)

## 2024-02-06 DIAGNOSIS — I169 Hypertensive crisis, unspecified: Secondary | ICD-10-CM | POA: Diagnosis not present

## 2024-02-06 DIAGNOSIS — N186 End stage renal disease: Secondary | ICD-10-CM

## 2024-02-06 DIAGNOSIS — T82848A Pain from vascular prosthetic devices, implants and grafts, initial encounter: Secondary | ICD-10-CM

## 2024-02-06 MED ORDER — SPIRONOLACTONE 25 MG PO TABS
100.0000 mg | ORAL_TABLET | Freq: Every day | ORAL | Status: DC
Start: 2024-02-07 — End: 2024-02-10
  Administered 2024-02-07 – 2024-02-10 (×4): 100 mg via ORAL
  Filled 2024-02-06 (×4): qty 4

## 2024-02-06 NOTE — Progress Notes (Signed)
 [facility]  Subjective  -having more left arm pain over his AV graft   Objective (!) 198/96 81 (!) 97.5 F (36.4 C) (Oral) 13 96%  Intake/Output Summary (Last 24 hours) at 02/06/2024 0935 Last data filed at 02/05/2024 1333 Gross per 24 hour  Intake --  Output 2000 ml  Net -2000 ml    Left arm AV graft with thrill There is a hematoma with fullness at the antecubitum in the left arm  Laboratory Lab Results: Recent Labs    02/04/24 0245 02/05/24 0813  WBC 8.4 8.1  HGB 8.3* 9.0*  HCT 26.7* 28.3*  PLT 128* 145*   BMET Recent Labs    02/04/24 0245 02/05/24 0813  NA 130* 129*  K 4.1 4.4  CL 93* 91*  CO2 23 23  GLUCOSE 97 113*  BUN 45* 70*  CREATININE 8.64* 12.00*  CALCIUM  8.2* 8.5*    COAG Lab Results  Component Value Date   INR 1.2 03/27/2023   No results found for: PTT  Assessment/Planning:  38 year old male with ESRD currently using a right IJ TDC.  He has a left arm brachial brachial graft placed at [facility] in July of this year.  Vascular surgery has been asked to evaluate his ongoing bacteremia.  When he had a fistulogram on 01/27/2024 by partner Dr. Lanis documented there was a hematoma in the left arm with plan to use dialysis catheter and rest the graft.  This is my first time seeing his arm.  He states it has been more painful.  Will order graft duplex today to evaluate if localized hematoma or fluid along the entire graft.  There is no overt cellulitis or drainage and the incision is well-healed.  Lonni JINNY Gaskins 02/06/2024 9:35 AM --

## 2024-02-06 NOTE — Progress Notes (Signed)
 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  He suffered rigors during HD and felt that he could not get warmed.  At presentation he was found to have a blood pressure of 233/139.  He was given labetalol  and a Cardene  drip was initiated.  CT head was with no acute findings.  He was initially admitted by the [facility] service.  Goals of Care:   Code Status: Full Code   DVT prophylaxis: heparin  injection 5,000 Units Start: 01/21/24 0600 SCDs Start: 01/20/24 2230   Interim Hx: No acute events recorded overnight.  Blood pressure remains difficult to control.  Vitals otherwise stable. States that he does not feel any better. Still c/o diffuse body aches and intermittent chills. Some intermittent HA as well. No SOB or chest pain.   Assessment & Plan:  Hypertensive emergency - severely uncontrolled HTN - refractory HTN Dosing of multiple medications has been limited by side effects/intolerance -likely also being driven by inability to tolerate full consistent dialysis treatments -continue to titrate medications and attempt ongoing dialysis - measure BP in thigh only for consistency -aldosterone to plasma renin activity ratio not consistent with primary aldosteronism - TSH is not suppressed - has a renal artery US  in July 2025 which was negative for RAS  Klebsiella oxytoca bacteremia Noted on 2/2 blood cultures 9/25 obtained due to recurring chills, with negative blood cultures prior on 9/11 - no signif fever or elevated WBC has been noted - ID consulted -antibiotic has been changed to cefepime  3 times per week with dialysis - ID recommending Vascular Surgery weigh in on possible sampling  of hematoma/fluid collection of the patient's left AVG so I have asked them to see him again today   Chest pain - resolved  Has been evaluated by Cardiology with no need for further investigation   ESRD on HD MWF Using Right IJ TDC at present to rest the L AVG in setting of possible hematoma - care per Nephrology - patient has experienced multiple different reactions with attempts at ongoing dialysis - consideration is being given to transitioning to peritoneal dialysis as an outpatient  Chronic diastolic congestive heart failure EF 55-60% via TTE August 2025 with grade 2 diastolic dysfunction - volume management per dialysis -appears euvolemic at present  Hard of hearing Due to Alport syndrome -able to communicate effectively using hearing aids  Bipolar disorder with depression Continue usual Depakote   Tobacco abuse Has been counseled on absolute need to discontinue smoking   Family Communication: No family present at time of exam Disposition:   Outpatient Pt9/23/2025 1200 -anticipate discharge home once medically stable  Objective: Blood pressure (!) 198/96, pulse 81, temperature (!) 97.5 F (36.4 C), temperature source Oral, resp. rate 13, height 5' 8 (1.727 m), weight 75.4 kg, SpO2 96%.  Intake/Output Summary (Last 24 hours) at 02/06/2024 0849 Last data filed at 02/05/2024 1333 Gross per 24 hour  Intake --  Output 2000 ml  Net -2000 ml   Filed Weights   02/05/24 0711 02/05/24 0908 02/05/24 1333  Weight: 78.2 kg 77.4 kg 75.4 kg    Examination: General: No acute respiratory distress -alert and conversant Lungs: Clear to auscultation bilaterally  Cardiovascular: Regular rate and  rhythm without murmur  Abdomen: NT/ND, soft, BS positive, no rebound Extremities: No significant edema bilateral lower extremities - signif swelling/hematoma of the distal aspect of the L AVG w/o fluctuance or erythema or calor  CBC: Recent Labs  Lab 02/03/24 0235 02/04/24 0245  02/05/24 0813  WBC 5.9 8.4 8.1  HGB 8.4* 8.3* 9.0*  HCT 26.7* 26.7* 28.3*  MCV 88.1 89.6 87.3  PLT 201 128* 145*   Basic Metabolic Panel: Recent Labs  Lab 02/03/24 0235 02/04/24 0245 02/05/24 0813  NA 128* 130* 129*  K 5.2* 4.1 4.4  CL 91* 93* 91*  CO2 22 23 23   GLUCOSE 103* 97 113*  BUN 74* 45* 70*  CREATININE 12.08* 8.64* 12.00*  CALCIUM  8.8* 8.2* 8.5*  PHOS 5.1* 4.0 5.2*   GFR: Estimated Creatinine Clearance: 8.2 mL/min (A) (by C-G formula based on SCr of 12 mg/dL (H)).   Scheduled Meds:  carvedilol   25 mg Oral BID   Chlorhexidine  Gluconate Cloth  6 each Topical Q0600   cinacalcet   30 mg Oral Q M,W,F-1800   clotrimazole    Topical BID   darbepoetin (ARANESP ) injection - DIALYSIS  150 mcg Subcutaneous Q Mon-1800   divalproex   500 mg Oral Daily   doxazosin   8 mg Oral Daily   heparin   5,000 Units Subcutaneous Q8H   hydrALAZINE   100 mg Oral TID   hydrocortisone  cream   Topical TID   isosorbide  mononitrate  20 mg Oral BID   losartan   100 mg Oral QPM   NIFEdipine   90 mg Oral BID   pantoprazole   40 mg Oral BID   sevelamer  carbonate  1,600 mg Oral TID WC   sodium zirconium cyclosilicate   10 g Oral Q M,W,F   spironolactone   50 mg Oral Daily      LOS: 17 days   Reyes IVAR Moores, MD [facility] Office  [phone] Pager - Text Page per Amion  If 7PM-7AM, please contact night-coverage per Amion 02/06/2024, 8:49 AM

## 2024-02-06 NOTE — Progress Notes (Signed)
 VASCULAR LAB    Left upper extremity duplex to evaluate for hematoma/perigraft fluid has been performed.  See CV proc for preliminary results.   Navaeh Kehres, RVT 02/06/2024, 3:57 PM

## 2024-02-06 NOTE — Progress Notes (Signed)
 Patient ID: [name], male   DOB: 08/20/1985, 38 y.o.   MRN: [id number] [facility] Progress Note   Assessment/ Plan:   1.  Hypertensive emergency: Continue efforts at trying to titrate antihypertensive therapy dosing largely limited by side effects and intolerances.  Will continue to check blood pressures in his left thigh for consistency; elevated blood pressure noted this morning and will follow-up with hemodialysis/UF.  No evidence of secondary hypertension.  Increase spironolactone  to 100 mg daily.  Appears to be at maximum efficacious dose of carvedilol , doxazosin , hydralazine , nifedipine  and losartan . 2. ESRD: On hemodialysis on MWF schedule and underwent this yesterday. Atypical reaction to dialyzer that is empirically being managed with hypoallergenic dialyzer and premedication with Benadryl /acetaminophen .  Plans for PD catheter placement/transition to PD deferred at this time. 3. Anemia: Low hemoglobin/hematocrit noted but trending upward, ESA therapy likely to be limited by his significantly elevated blood pressures. 4. CKD-MBD: Phosphorus level and calcium  level currently at goal, continue sevelamer  for phosphorus binding and Cinacalcet  for PTH control. 5. Nutrition: Continue renal diet with sodium restriction and fluid restriction. 6. Klebsiella oxytoca/Enterobacter bacteremia: Source unclear and plans noted for antibiotic therapy with cefepime  as workup undertaken for source identification.  Atypical swelling/possible hematoma around left antecubital to be evaluated further by vascular surgery.  No plans for [facility] removal at this time based on speciation unless unable to clear bacteremia.  Subjective:   Feeling poorly this morning with worsening pain/swelling around left arm antecubital/upper arm area.   Objective:   BP (!) 198/96 (BP Location: Left Leg)   Pulse 81   Temp (!) 97.5 F (36.4 C) (Oral)   Resp 13   Ht 5' 8 (1.727 m)   Wt 75.4 kg   SpO2 96%   BMI  25.27 kg/m   Physical Exam: Gen: Appears uncomfortable resting in bed Respiratory: Clear to auscultation bilaterally, no rales/rhonchi Cardiovascular: Pulse regular rhythm, normal rate, S1 and S2.  Right IJ TDC Abd: Soft, flat, nontender, bowel sounds normal Ext: Trace bilateral ankle edema, tenderness over right leg.  Left arm AVG with palpable outflow thrill and tenderness/swelling close to the antecubital area in the lower region of the upper arm.   Labs: BMET Recent Labs  Lab 01/31/24 0229 02/01/24 0406 02/02/24 0947 02/03/24 0235 02/04/24 0245 02/05/24 0813  NA 134* 130* 131* 128* 130* 129*  K 4.2 4.9 4.2 5.2* 4.1 4.4  CL 95* 93* 93* 91* 93* 91*  CO2 26 23 21* 22 23 23   GLUCOSE 96 93 106* 103* 97 113*  BUN 44* 70* 55* 74* 45* 70*  CREATININE 8.57* 11.69* 10.01* 12.08* 8.64* 12.00*  CALCIUM  8.9 9.1 8.4* 8.8* 8.2* 8.5*  PHOS 4.8* 5.5* 4.7* 5.1* 4.0 5.2*   CBC Recent Labs  Lab 02/02/24 0947 02/03/24 0235 02/04/24 0245 02/05/24 0813  WBC 5.5 5.9 8.4 8.1  HGB 8.7* 8.4* 8.3* 9.0*  HCT 27.6* 26.7* 26.7* 28.3*  MCV 89.0 88.1 89.6 87.3  PLT 193 201 128* 145*      Medications:     carvedilol   25 mg Oral BID   Chlorhexidine  Gluconate Cloth  6 each Topical Q0600   cinacalcet   30 mg Oral Q M,W,F-1800   clotrimazole    Topical BID   darbepoetin (ARANESP ) injection - DIALYSIS  150 mcg Subcutaneous Q Mon-1800   divalproex   500 mg Oral Daily   doxazosin   8 mg Oral Daily   heparin   5,000 Units Subcutaneous Q8H   hydrALAZINE   100 mg Oral  TID   hydrocortisone  cream   Topical TID   isosorbide  mononitrate  20 mg Oral BID   losartan   100 mg Oral QPM   NIFEdipine   90 mg Oral BID   pantoprazole   40 mg Oral BID   sevelamer  carbonate  1,600 mg Oral TID WC   sodium zirconium cyclosilicate   10 g Oral Q M,W,F   spironolactone   50 mg Oral Daily   Gordy Blanch, MD 02/06/2024, 9:04 AM

## 2024-02-07 DIAGNOSIS — I169 Hypertensive crisis, unspecified: Secondary | ICD-10-CM | POA: Diagnosis not present

## 2024-02-07 LAB — CULTURE, BLOOD (ROUTINE X 2)
Special Requests: ADEQUATE
Special Requests: ADEQUATE

## 2024-02-07 MED ORDER — MINOXIDIL 2.5 MG PO TABS
2.5000 mg | ORAL_TABLET | Freq: Two times a day (BID) | ORAL | Status: DC
  Administered 2024-02-07 – 2024-02-09 (×6): 2.5 mg via ORAL
  Filled 2024-02-07 (×8): qty 1

## 2024-02-07 MED ORDER — CALCIUM CARBONATE ANTACID 500 MG PO CHEW
2.0000 | CHEWABLE_TABLET | Freq: Three times a day (TID) | ORAL | Status: DC | PRN
  Administered 2024-02-07 – 2024-02-10 (×9): 400 mg via ORAL
  Filled 2024-02-07 (×10): qty 2

## 2024-02-07 NOTE — Progress Notes (Signed)
 BP 203/99 had Lopressor  5 mg IV at 00:08  Dr.S. Debby text paged

## 2024-02-07 NOTE — Plan of Care (Signed)
 Id brief note   Repeat bcx 9/27 ngtd Reviewed vascular surgery note and very reasonable and thorough assessment regarding the avg/hematoma next to it   -if 9/27 bcx remains negative would finish 10 day course of cefepime  2 gram three times per week with dialysis -if 9/27 bcx returns positive within the next 48 hours would remove the hd catheter, and time 7 more days of abx treatment after catheter removal -discussed with primary team

## 2024-02-07 NOTE — Progress Notes (Signed)
 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  He suffered rigors during HD and felt that he could not get warmed.  At presentation he was found to have a blood pressure of 233/139.  He was given labetalol  and a Cardene  drip was initiated.  CT head was with no acute findings.  He was initially admitted by the [facility] service.  Goals of Care:   Code Status: Full Code   DVT prophylaxis: heparin  injection 5,000 Units Start: 01/21/24 0600 SCDs Start: 01/20/24 2230   Interim Hx: Afebrile.  Blood pressure remains uncontrolled but vitals are otherwise stable.  WBC normal.  Hemoglobin stable.  Repeat blood cultures 9/27 thus far no growth x 2.  The patient is resting quietly at the time of my visit.  Assessment & Plan:  Hypertensive emergency - severely uncontrolled HTN - refractory HTN Dosing of multiple medications has been limited by side effects/intolerance - likely also being driven by inability to tolerate full consistent dialysis treatments -continue to titrate medications and attempt ongoing dialysis - measure BP in L thigh only for consistency -aldosterone to plasma renin activity ratio not consistent with primary aldosteronism - TSH is not suppressed - has a renal artery US  in July 2025 which was negative for RAS -adding minoxidil  today  Klebsiella oxytoca bacteremia Noted on 2/2 blood cultures 9/25 obtained due to recurring chills, with negative blood cultures prior on 9/11 - no signif fever or elevated WBC has been noted - ID consulted -antibiotic has been changed to cefepime  3 times per week with dialysis - Vascular Surgery has reevaluated the patient's left arm AVG and finds  no evidence to suggest infection suggesting close monitoring of the hematoma for now - plan is to complete 10 days of cefepime  (being dosed as 2g every HD)  Chest pain - resolved  Has been evaluated by Cardiology with no need for further investigation   ESRD on HD MWF Using Right IJ TDC at present to rest the L AVG in setting of hematoma - care per Nephrology - patient has experienced multiple different reactions with attempts at ongoing dialysis - consideration is being given to transitioning to peritoneal dialysis as an outpatient  Chronic diastolic congestive heart failure EF 55-60% via TTE August 2025 with grade 2 diastolic dysfunction - volume management per dialysis - appears euvolemic at present  Hard of hearing Due to Alport syndrome -able to communicate effectively using hearing aids  Bipolar disorder with depression Continue usual Depakote   Tobacco abuse Has been counseled on absolute need to discontinue smoking   Family Communication: No family present at time of exam Disposition:   Outpatient Pt9/23/2025 1200 -anticipate discharge home once medically stable  Objective: Blood pressure (!) 220/124, pulse 81, temperature 98.5 F (36.9 C), temperature source Oral, resp. rate 17, height 5' 8 (1.727 m), weight 76.6 kg, SpO2 100%.  Intake/Output Summary (Last 24 hours) at 02/07/2024 0940 Last data filed at 02/06/2024 2357 Gross per 24 hour  Intake 120 ml  Output --  Net 120 ml   Filed Weights   02/05/24 0908 02/05/24 1333 02/07/24 0645  Weight: 77.4 kg 75.4 kg 76.6 kg    Examination: Resting quietly in no acute distress.  Respirations are even and unlabored.  CBC: Recent Labs  Lab 02/03/24 0235 02/04/24 0245 02/05/24 0813  WBC 5.9 8.4 8.1  HGB 8.4* 8.3* 9.0*  HCT 26.7* 26.7* 28.3*  MCV 88.1 89.6 87.3  PLT 201 128* 145*   Basic Metabolic Panel: Recent Labs  Lab 02/03/24 0235 02/04/24 0245 02/05/24 0813  NA 128* 130* 129*  K 5.2* 4.1 4.4  CL 91* 93*  91*  CO2 22 23 23   GLUCOSE 103* 97 113*  BUN 74* 45* 70*  CREATININE 12.08* 8.64* 12.00*  CALCIUM  8.8* 8.2* 8.5*  PHOS 5.1* 4.0 5.2*   GFR: Estimated Creatinine Clearance: 8.2 mL/min (A) (by C-G formula based on SCr of 12 mg/dL (H)).   Scheduled Meds:  carvedilol   25 mg Oral BID   Chlorhexidine  Gluconate Cloth  6 each Topical Q0600   cinacalcet   30 mg Oral Q M,W,F-1800   clotrimazole    Topical BID   darbepoetin (ARANESP ) injection - DIALYSIS  150 mcg Subcutaneous Q Mon-1800   divalproex   500 mg Oral Daily   doxazosin   8 mg Oral Daily   heparin   5,000 Units Subcutaneous Q8H   hydrALAZINE   100 mg Oral TID   hydrocortisone  cream   Topical TID   isosorbide  mononitrate  20 mg Oral BID   losartan   100 mg Oral QPM   NIFEdipine   90 mg Oral BID   pantoprazole   40 mg Oral BID   sevelamer  carbonate  1,600 mg Oral TID WC   sodium zirconium cyclosilicate   10 g Oral Q M,W,F   spironolactone   100 mg Oral Daily      LOS: 18 days   Reyes IVAR Moores, MD [facility]  [phone] Pager - Text Page per Amion  If 7PM-7AM, please contact night-coverage per Amion 02/07/2024, 9:40 AM

## 2024-02-07 NOTE — Plan of Care (Signed)

## 2024-02-07 NOTE — Progress Notes (Signed)
 Patient ID: [name], male   DOB: Dec 17, 1985, 38 y.o.   MRN: [id number] [facility] Progress Note   Assessment/ Plan:   1.  Hypertensive emergency: Continue efforts at trying to titrate antihypertensive therapy dosing largely limited by side effects and intolerances.  Will place nursing care instruction order to check blood pressures in his left thigh for consistency (and what appears to be more accurate blood pressures).  He is on carvedilol  25 mg twice daily, hydralazine  100 mg 3 times daily, doxazosin  8 mg daily, isosorbide  20 mg twice daily, losartan  100 mg daily, nifedipine  90 mg twice daily and spironolactone  100 mg daily. 2. ESRD: On hemodialysis on MWF schedule and will order for hemodialysis again tomorrow. Atypical reaction to dialyzer that is empirically being managed with hypoallergenic dialyzer and premedication with Benadryl /acetaminophen .  Plans for PD catheter placement/transition to PD deferred at this time with home visit pending. 3. Anemia: Low hemoglobin/hematocrit noted but trending upward, ESA therapy likely to be limited by his significantly elevated blood pressures. 4. CKD-MBD: Phosphorus level and calcium  level currently at goal, continue sevelamer  for phosphorus binding and Cinacalcet  for PTH control. 5. Nutrition: Continue renal diet with sodium restriction and fluid restriction. 6. Klebsiella oxytoca/Enterobacter bacteremia: Source unclear and plans noted for antibiotic therapy with cefepime  as workup undertaken for source identification.  Left upper arm hematoma adjacent to his AV graft does not appear to be infected.  No plans for [facility] removal at this time based on speciation unless unable to clear bacteremia.  Subjective:   Some problems with left upper arm pain/discomfort overnight.  Afebrile.  Continued problems with blood pressure control however, nurse informs me that they have been using the left leg for blood pressure control contrary to previous  instructions to utilize left thigh for uniformity (and the discrepancy with checking it in the leg).   Objective:   BP (!) 220/124 (BP Location: Left Leg)   Pulse 81   Temp 98.5 F (36.9 C) (Oral)   Resp 17   Ht 5' 8 (1.727 m)   Wt 76.6 kg   SpO2 100%   BMI 25.67 kg/m   Physical Exam: Gen: Appears comfortable resting in bed, speaking to vascular service Respiratory: Clear to auscultation bilaterally, no rales/rhonchi Cardiovascular: Pulse regular rhythm, normal rate, S1 and S2.  Right IJ TDC Abd: Soft, flat, nontender, bowel sounds normal Ext: Trace ankle edema, some tenderness over shins.  Left arm AVG with palpable outflow thrill and tenderness/swelling close to the antecubital area in the lower region of the upper arm.   Labs: BMET Recent Labs  Lab 02/01/24 0406 02/02/24 0947 02/03/24 0235 02/04/24 0245 02/05/24 0813  NA 130* 131* 128* 130* 129*  K 4.9 4.2 5.2* 4.1 4.4  CL 93* 93* 91* 93* 91*  CO2 23 21* 22 23 23   GLUCOSE 93 106* 103* 97 113*  BUN 70* 55* 74* 45* 70*  CREATININE 11.69* 10.01* 12.08* 8.64* 12.00*  CALCIUM  9.1 8.4* 8.8* 8.2* 8.5*  PHOS 5.5* 4.7* 5.1* 4.0 5.2*   CBC Recent Labs  Lab 02/02/24 0947 02/03/24 0235 02/04/24 0245 02/05/24 0813  WBC 5.5 5.9 8.4 8.1  HGB 8.7* 8.4* 8.3* 9.0*  HCT 27.6* 26.7* 26.7* 28.3*  MCV 89.0 88.1 89.6 87.3  PLT 193 201 128* 145*      Medications:     carvedilol   25 mg Oral BID   Chlorhexidine  Gluconate Cloth  6 each Topical Q0600   cinacalcet   30 mg Oral Q M,W,F-1800  clotrimazole    Topical BID   darbepoetin (ARANESP ) injection - DIALYSIS  150 mcg Subcutaneous Q Mon-1800   divalproex   500 mg Oral Daily   doxazosin   8 mg Oral Daily   heparin   5,000 Units Subcutaneous Q8H   hydrALAZINE   100 mg Oral TID   hydrocortisone  cream   Topical TID   isosorbide  mononitrate  20 mg Oral BID   losartan   100 mg Oral QPM   NIFEdipine   90 mg Oral BID   pantoprazole   40 mg Oral BID   sevelamer  carbonate  1,600 mg  Oral TID WC   sodium zirconium cyclosilicate   10 g Oral Q M,W,F   spironolactone   100 mg Oral Daily   Gordy Blanch, MD 02/07/2024, 8:42 AM

## 2024-02-07 NOTE — Progress Notes (Addendum)
 Progress Note    02/07/2024 9:21 AM 11 Days Post-Op  Subjective: Says his left arm still hurts    Vitals:   02/07/24 0354 02/07/24 0806  BP: (!) 210/99 (!) 220/124  Pulse: 80 81  Resp: 10 17  Temp: 98.5 F (36.9 C)   SpO2: 95% 100%    Physical Exam: General: Sitting up in bed, no acute distress Cardiac: Regular rhythm, hypertensive Lungs: Nonlabored Extremities: Unchanged exam of left upper arm.  Left upper arm AV graft with thrill.  There is firm hematoma at the antecubitum without warmth, tenderness, or skin breakdown   CBC    Component Value Date/Time   WBC 8.1 02/05/2024 0813   RBC 3.24 (L) 02/05/2024 0813   HGB 9.0 (L) 02/05/2024 0813   HGB 10.4 (L) 03/17/2022 1721   HCT 28.3 (L) 02/05/2024 0813   HCT 30.2 (L) 03/17/2022 1721   PLT 145 (L) 02/05/2024 0813   PLT 207 03/17/2022 1721   MCV 87.3 02/05/2024 0813   MCV 87 03/17/2022 1721   MCH 27.8 02/05/2024 0813   MCHC 31.8 02/05/2024 0813   RDW 16.3 (H) 02/05/2024 0813   RDW 14.0 03/17/2022 1721   LYMPHSABS 1.2 01/20/2024 2100   MONOABS 0.6 01/20/2024 2100   EOSABS 0.2 01/20/2024 2100   BASOSABS 0.0 01/20/2024 2100    BMET    Component Value Date/Time   NA 129 (L) 02/05/2024 0813   NA 139 06/16/2022 1606   K 4.4 02/05/2024 0813   CL 91 (L) 02/05/2024 0813   CO2 23 02/05/2024 0813   GLUCOSE 113 (H) 02/05/2024 0813   BUN 70 (H) 02/05/2024 0813   BUN 72 (H) 06/16/2022 1606   CREATININE 12.00 (H) 02/05/2024 0813   CALCIUM  8.5 (L) 02/05/2024 0813   GFRNONAA 5 (L) 02/05/2024 0813   GFRAA 6 (L) 10/26/2019 1032    INR    Component Value Date/Time   INR 1.2 03/27/2023 1828     Intake/Output Summary (Last 24 hours) at 02/07/2024 9078 Last data filed at 02/06/2024 2357 Gross per 24 hour  Intake 120 ml  Output --  Net 120 ml      Assessment/Plan:  38 y.o. male admitted with hypertensive crisis   - The patient says he feels about the same today.  He continues to have pain in his left arm at  the site of the hematoma.  His pain is currently controlled -Duplex of the left upper arm yesterday demonstrates a patent AV graft with unchanged hematoma.  No perigraft fluid was noted. -His left arm remains the same this morning.  His graft has a great thrill in it.  There is a hematoma with fullness at the antecubitum, however this is not getting larger and does not appear infected.  There is no warmth, redness, or skin breakdown to the area. -At this time there are no indications for vascular invention.  We have discussed with the patient if his graft seemed infected, he would require irrigation and debridement of the upper arm and total resection of the AV graft -We recommend continued rest of the graft until his hematoma has resolved.  He can continue to use his dialysis catheter.   Ahmed Holster, PA-C [facility] [phone] 02/07/2024 9:21 AM  I have seen and evaluated the patient. I agree with the PA note as documented above.  Discussed that his duplex yesterday shows hematoma around his graft placed at [employer] at the antecubitum but no perigraft fluid as would  be expected with graft infection.  The incision is well-healed.  The graft has a great thrill.  I would not offer biopsy of the hematoma as this would only risk seeding the graft.  The options are graft excision and vein patch of the artery versus continued antibiotics and surveillance.  I will have one of my partners look at him tomorrow who already have been following him previously during this admission for another opinion as well.  Lonni DOROTHA Gaskins, MD [facility] [place] Office: [phone]

## 2024-02-08 DIAGNOSIS — T82838A Hemorrhage of vascular prosthetic devices, implants and grafts, initial encounter: Secondary | ICD-10-CM | POA: Diagnosis not present

## 2024-02-08 DIAGNOSIS — I169 Hypertensive crisis, unspecified: Secondary | ICD-10-CM | POA: Diagnosis not present

## 2024-02-08 LAB — RENAL FUNCTION PANEL
Albumin: 3.1 g/dL — ABNORMAL LOW (ref 3.5–5.0)
Anion gap: 20 — ABNORMAL HIGH (ref 5–15)
BUN: 85 mg/dL — ABNORMAL HIGH (ref 6–20)
CO2: 22 mmol/L (ref 22–32)
Calcium: 9 mg/dL (ref 8.9–10.3)
Chloride: 90 mmol/L — ABNORMAL LOW (ref 98–111)
Creatinine, Ser: 14.82 mg/dL — ABNORMAL HIGH (ref 0.61–1.24)
GFR, Estimated: 4 mL/min — ABNORMAL LOW (ref >60)
Glucose, Bld: 128 mg/dL — ABNORMAL HIGH (ref 70–99)
Phosphorus: 5.2 mg/dL — ABNORMAL HIGH (ref 2.5–4.6)
Potassium: 4.5 mmol/L (ref 3.5–5.1)
Sodium: 132 mmol/L — ABNORMAL LOW (ref 135–145)

## 2024-02-08 LAB — CBC
HCT: 29.7 % — ABNORMAL LOW (ref 39.0–52.0)
Hemoglobin: 9.5 g/dL — ABNORMAL LOW (ref 13.0–17.0)
MCH: 27.4 pg (ref 26.0–34.0)
MCHC: 32 g/dL (ref 30.0–36.0)
MCV: 85.6 fL (ref 80.0–100.0)
Platelets: 235 K/uL (ref 150–400)
RBC: 3.47 MIL/uL — ABNORMAL LOW (ref 4.22–5.81)
RDW: 16.3 % — ABNORMAL HIGH (ref 11.5–15.5)
WBC: 7.1 K/uL (ref 4.0–10.5)
nRBC: 0 % (ref 0.0–0.2)

## 2024-02-08 LAB — GLUCOSE, CAPILLARY
Glucose-Capillary: 72 mg/dL (ref 70–99)
Glucose-Capillary: 133 mg/dL — ABNORMAL HIGH (ref 70–99)
Glucose-Capillary: 126 mg/dL — ABNORMAL HIGH (ref 70–99)
Glucose-Capillary: 62 mg/dL — ABNORMAL LOW (ref 70–99)
Glucose-Capillary: 73 mg/dL (ref 70–99)
Glucose-Capillary: 116 mg/dL — ABNORMAL HIGH (ref 70–99)
Glucose-Capillary: 72 mg/dL (ref 70–99)

## 2024-02-08 MED ORDER — HEPARIN SODIUM (PORCINE) 1000 UNIT/ML DIALYSIS
2000.0000 [IU] | INTRAMUSCULAR | Status: AC
  Administered 2024-02-08: 2000 [IU] via INTRAVENOUS_CENTRAL

## 2024-02-08 MED ORDER — HYDROMORPHONE HCL 1 MG/ML IJ SOLN
INTRAMUSCULAR | Status: AC
  Filled 2024-02-08: qty 1

## 2024-02-08 MED ORDER — HEPARIN SODIUM (PORCINE) 1000 UNIT/ML DIALYSIS
40.0000 [IU]/kg | INTRAMUSCULAR | Status: DC | PRN
  Administered 2024-02-08: 3200 [IU] via INTRAVENOUS_CENTRAL
  Administered 2024-02-08: 3100 [IU] via INTRAVENOUS_CENTRAL
  Filled 2024-02-08 (×3): qty 4

## 2024-02-08 MED ORDER — METOPROLOL TARTRATE 5 MG/5ML IV SOLN
INTRAVENOUS | Status: AC
  Filled 2024-02-08: qty 5

## 2024-02-08 MED ORDER — DIPHENHYDRAMINE HCL 50 MG/ML IJ SOLN
INTRAMUSCULAR | Status: AC
  Filled 2024-02-08: qty 1

## 2024-02-08 MED ORDER — HEPARIN SODIUM (PORCINE) 1000 UNIT/ML IJ SOLN
INTRAMUSCULAR | Status: AC
  Filled 2024-02-08: qty 4

## 2024-02-08 MED ORDER — ALTEPLASE 2 MG IJ SOLR
2.0000 mg | Freq: Once | INTRAMUSCULAR | Status: DC | PRN
  Filled 2024-02-08: qty 2

## 2024-02-08 MED ORDER — DIPHENHYDRAMINE HCL 50 MG/ML IJ SOLN
50.0000 mg | Freq: Once | INTRAMUSCULAR | Status: AC
  Administered 2024-02-08: 50 mg via INTRAVENOUS

## 2024-02-08 MED ORDER — HEPARIN SODIUM (PORCINE) 1000 UNIT/ML IJ SOLN
INTRAMUSCULAR | Status: AC
  Filled 2024-02-08: qty 2

## 2024-02-08 MED ORDER — ANTICOAGULANT SODIUM CITRATE 4% (200MG/5ML) IV SOLN
5.0000 mL | Status: DC | PRN
  Filled 2024-02-08: qty 5

## 2024-02-08 MED ORDER — HEPARIN SODIUM (PORCINE) 1000 UNIT/ML DIALYSIS
1000.0000 [IU] | INTRAMUSCULAR | Status: DC | PRN
  Filled 2024-02-08: qty 1

## 2024-02-08 NOTE — Progress Notes (Signed)
 Patient requesting blood sugar be check. I am shaking feel like my sugar is low

## 2024-02-08 NOTE — Progress Notes (Addendum)
 [facility] Progress Note   Subjective:   Seen on HD - 3L UFG and tolerating. BP very high to start - coming down with HD. Denies CP/dyspnea.   Objective Vitals:   02/08/24 0900 02/08/24 0930 02/08/24 1000 02/08/24 1030  BP: (!) 190/105 (!) 157/96 (!) 169/95 (!) 156/79  Pulse: 74 76 78 82  Resp: 13 11 14 16   Temp:      TempSrc:      SpO2: 100% 99% 99% 99%  Weight:      Height:       Physical Exam General: Well appearing man, NAD Heart: RRR Lungs: Bibasilar rales Abdomen: soft Extremities: trace BLE and LLE edema Dialysis Access: TDC, L AVG with hematoma which is tender  Additional Objective Labs: Basic Metabolic Panel: Recent Labs  Lab 02/04/24 0245 02/05/24 0813 02/08/24 0754  NA 130* 129* 132*  K 4.1 4.4 4.5  CL 93* 91* 90*  CO2 23 23 22   GLUCOSE 97 113* 128*  BUN 45* 70* 85*  CREATININE 8.64* 12.00* 14.82*  CALCIUM  8.2* 8.5* 9.0  PHOS 4.0 5.2* 5.2*   Liver Function Tests: Recent Labs  Lab 02/04/24 0245 02/05/24 0813 02/08/24 0754  ALBUMIN  2.9* 3.2* 3.1*   CBC: Recent Labs  Lab 02/02/24 0947 02/03/24 0235 02/04/24 0245 02/05/24 0813 02/08/24 0754  WBC 5.5 5.9 8.4 8.1 7.1  HGB 8.7* 8.4* 8.3* 9.0* 9.5*  HCT 27.6* 26.7* 26.7* 28.3* 29.7*  MCV 89.0 88.1 89.6 87.3 85.6  PLT 193 201 128* 145* 235   Studies/Results: VAS US  DUPLEX DIALYSIS ACCESS (AVF, AVG) Result Date: 02/07/2024 DIALYSIS ACCESS Patient Name:  [name] [facility]  Date of Exam:   02/06/2024 Medical Rec #: [id number]        Accession #:    [id number] Date of Birth: 12-22-1985        Patient Gender: M Patient Age:   38 years Exam Location:  [facility] Procedure:      VAS US  DUPLEX DIALYSIS ACCESS (AVF, AVG) Referring Phys: [facility] --------------------------------------------------------------------------------  Reason for Exam: Follow-up of hematoma of left arm found 01/22/24. Access Site: Left Upper Extremity. Access Type: AV graft. History: Fistulogram of left arm  AV graft done 01/27/24 indicating no          flow-limiting stenosis and widely patent anastomosis both proximally          and distally. No outflow stenosis. Comparison Study: 01/22/24 Performing Technologist: Alberta Lis RVS  Examination Guidelines: A complete evaluation includes B-mode imaging, spectral Doppler, color Doppler, and power Doppler as needed of all accessible portions of each vessel. Unilateral testing is considered an integral part of a complete examination. Limited examinations for reoccurring indications may be performed as noted.  Findings:   Patent graft. Prior hematoma noted 01/22/24, remains. No perigraft fluid noted.  Summary: Hematoma still present in left upper arm. No perigraft fluid noted.  *See table(s) above for measurements and observations.  Diagnosing physician: Lonni Gaskins MD Electronically signed by Lonni Gaskins MD on 02/07/2024 at 9:19:38 AM.    --------------------------------------------------------------------------------   Final    Medications:  anticoagulant sodium citrate      ceFEPime  (MAXIPIME ) IV     promethazine  (PHENERGAN ) injection (IM or IVPB)      carvedilol   25 mg Oral BID   Chlorhexidine  Gluconate Cloth  6 each Topical Q0600   cinacalcet   30 mg Oral Q M,W,F-1800   clotrimazole    Topical BID   darbepoetin (ARANESP ) injection - DIALYSIS  150  mcg Subcutaneous Q Mon-1800   divalproex   500 mg Oral Daily   doxazosin   8 mg Oral Daily   heparin   5,000 Units Subcutaneous Q8H   hydrALAZINE   100 mg Oral TID   hydrocortisone  cream   Topical TID   isosorbide  mononitrate  20 mg Oral BID   losartan   100 mg Oral QPM   minoxidil   2.5 mg Oral BID   NIFEdipine   90 mg Oral BID   pantoprazole   40 mg Oral BID   sevelamer  carbonate  1,600 mg Oral TID WC   sodium zirconium cyclosilicate   10 g Oral Q M,W,F   spironolactone   100 mg Oral Daily    Dialysis Orders MWF - NW 4hrs, 350/A1.5, EDW 73.8kg, 2K/2Ca bath, TDC, no heparin  - Mircera 200mcg IV q 2  weeks - last 9/8 - Calcitriol  0.75mcg PO q HD - Sensipar  30mg  q HD  Assessment/Plan: HTN emergency: Ongoing med escalation, pulling fluid with HD. Remains volume up. Negative secondary work-up per notes. Currently on Coreg  25mg  BID, doxazosin  8mg  every day, hydralazine  100mg  TID, nifedipine  90mg  BID, isosorbide  20mg  BID, losartan  100mg  at bedtime, spironolactone  100mg  at bedtime, minoxidil  2.5mg  BID. Dialyzer reaction: Now unclear if reaction, may have been early signs of bacteremia. Using hypoallergenic dialyzer and pre-medicating with benadryl  - follow. Klebsiella oxytoca/Enterobacter bacteremia: Blood Cx + 9/25, on Cefepime .Repeat blood Cx 9/27 negative so far. Plan is 2 weeks of IV Cefepime  (through 10/9?) ESRD: Continue HD on MWF schedule - HD now, 3L UFG. Anemia of ESRD: Hgb 9.5 - continue ESA q Monday. Secondary HPTH: Ca/Phos ok. Continue home meds. Nutrition: Alb low, continue ^ protein renal diet.    Izetta Boehringer, PA-C 02/08/2024, 10:47 AM  [employer]

## 2024-02-08 NOTE — Progress Notes (Signed)
 PT Cancellation Note  Patient Details Name: [name] MRN: [id number] DOB: [birth year]   Cancelled Treatment:    Reason Eval/Treat Not Completed: (P) Patient at procedure or test/unavailable (pt off unit at HD dept.) Will continue efforts per PT plan of care as schedule permits.   Connell HERO Aryanna Shaver 02/08/2024, 9:16 AM

## 2024-02-08 NOTE — Progress Notes (Signed)
   02/08/24 1245  Vitals  Pulse Rate 87  Resp 14  BP (!) 197/91  SpO2 99 %  O2 Device Room Air  Oxygen Therapy  Patient Activity (if Appropriate) In bed  Pulse Oximetry Type Continuous  Oximetry Probe Site Changed Yes  During Treatment Monitoring  Blood Flow Rate (mL/min) 399 mL/min  Arterial Pressure (mmHg) -181 mmHg  Venous Pressure (mmHg) 168.88 mmHg  TMP (mmHg) 29.09 mmHg  Ultrafiltration Rate (mL/min) 1446 mL/min  Dialysate Flow Rate (mL/min) 299 ml/min  Duration of HD Treatment -hour(s) 2.98 hour(s)  Cumulative Fluid Removed (mL) per Treatment  2191.36  HD Safety Checks Performed Yes  Intra-Hemodialysis Comments Progressing as prescribed   Pt complaining of severe headache. Notified PA-Stovall, ok to give prn metoprolol  5mg  IV.

## 2024-02-08 NOTE — Progress Notes (Signed)
  Glucose-Capillary Glucose, capillary Collected: 02/08/24 1404  Result status: Final  Resulting lab: [facility]  Reference range: 70 - 99 mg/dL  Value: 62 Low   Comment: Glucose reference range applies only to samples taken after fasting for at least 8 hours.  *Additional information available - comment   Pt given of apple juice, currently eating [place] sandwhich, apple, rice cereal treat.

## 2024-02-08 NOTE — Progress Notes (Addendum)
  Progress Note    02/08/2024 7:39 AM 12 Days Post-Op  Subjective: Says his pressure and pain in the left arm is the same    Vitals:   02/08/24 0100 02/08/24 0624  BP: (!) 194/108 (!) 208/112  Pulse: 72 76  Resp: 16 16  Temp: 98.4 F (36.9 C) 98.4 F (36.9 C)  SpO2:  100%    Physical Exam: General: Sitting up in bed, eating breakfast Cardiac: Regular Lungs: Nonlabored Extremities: Left upper arm AV graft with thrill.  Fullness of the left upper arm near the antecubitum, without warmth, tenderness, or skin breakdown.   CBC    Component Value Date/Time   WBC 8.1 02/05/2024 0813   RBC 3.24 (L) 02/05/2024 0813   HGB 9.0 (L) 02/05/2024 0813   HGB 10.4 (L) 03/17/2022 1721   HCT 28.3 (L) 02/05/2024 0813   HCT 30.2 (L) 03/17/2022 1721   PLT 145 (L) 02/05/2024 0813   PLT 207 03/17/2022 1721   MCV 87.3 02/05/2024 0813   MCV 87 03/17/2022 1721   MCH 27.8 02/05/2024 0813   MCHC 31.8 02/05/2024 0813   RDW 16.3 (H) 02/05/2024 0813   RDW 14.0 03/17/2022 1721   LYMPHSABS 1.2 01/20/2024 2100   MONOABS 0.6 01/20/2024 2100   EOSABS 0.2 01/20/2024 2100   BASOSABS 0.0 01/20/2024 2100    BMET    Component Value Date/Time   NA 129 (L) 02/05/2024 0813   NA 139 06/16/2022 1606   K 4.4 02/05/2024 0813   CL 91 (L) 02/05/2024 0813   CO2 23 02/05/2024 0813   GLUCOSE 113 (H) 02/05/2024 0813   BUN 70 (H) 02/05/2024 0813   BUN 72 (H) 06/16/2022 1606   CREATININE 12.00 (H) 02/05/2024 0813   CALCIUM  8.5 (L) 02/05/2024 0813   GFRNONAA 5 (L) 02/05/2024 0813   GFRAA 6 (L) 10/26/2019 1032    INR    Component Value Date/Time   INR 1.2 03/27/2023 1828     Intake/Output Summary (Last 24 hours) at 02/08/2024 0739 Last data filed at 02/08/2024 0211 Gross per 24 hour  Intake --  Output 1 ml  Net -1 ml      Assessment/Plan:  38 y.o. male admitted with hypertensive crisis   - He says that his pressure in the left arm is about the same today -On exam his left arm appears the  same today as it was over the weekend.  There is no fluctuance on exam.  There is no warmth, tenderness, or skin breakdown.  He does have a hematoma around his graft with fullness in the arm -We recommend continued graft rest and use of his [facility] for dialysis.  He can also continue antibiotics and close surveillance to ensure that his hematoma remains uninfected   Ahmed Holster, PA-C [facility] [phone] 02/08/2024 7:39 AM  VASCULAR STAFF ADDENDUM: I have independently interviewed and examined the patient. I agree with the above.  I agree with Dr. Gaylene assessment from yesterday.  No signs of overt infection.  Skin appears healthy, no induration, no erythema.  Pain is normal in the setting of hematoma.  Surgical intervention would threaten the graft, and actually increase the risk of infection.  Recommend continued conservative therapy.  Fonda FORBES Rim MD [facility] Phone Number: [phone] 02/08/2024 1:00 PM

## 2024-02-08 NOTE — Progress Notes (Signed)
CBG 72  

## 2024-02-08 NOTE — Plan of Care (Signed)
  Problem: Education: Goal: Knowledge of General Education information will improve Description: Including pain rating scale, medication(s)/side effects and non-pharmacologic comfort measures Outcome: Progressing   Problem: Pain Managment: Goal: General experience of comfort will improve and/or be controlled Outcome: Progressing   Problem: Skin Integrity: Goal: Risk for impaired skin integrity will decrease Outcome: Progressing

## 2024-02-08 NOTE — Progress Notes (Signed)
 [name]  FMW:[id number] DOB: [birth year] DOA: 01/20/2024 PCP: Lonnie Earnest, MD    Brief Narrative:  38 year old with a history of HTN, diastolic CHF, Alport syndrome, 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  He suffered rigors during HD and felt that he could not get warmed.  At presentation he was found to have a blood pressure of 233/139.  He was given labetalol  and a Cardene  drip was initiated.  CT head was with no acute findings.  He was initially admitted by the [name] service.  Goals of Care:   Code Status: Full Code   DVT prophylaxis: heparin  injection 5,000 Units Start: 01/21/24 0600 SCDs Start: 01/20/24 2230   Interim Hx: No acute events reported overnight.  Patient has had transient improvement in blood pressure with 1 measurement of 157/96.  CBG controlled at 72-133.  WBC normal.  Hemoglobin stable.  Repeat blood cultures obtained 9/27 thus far no growth.  He is seen in dialysis.  He tells me he continues to feel bad and states he has had another one of his episodes while on dialysis today.  He appears to be resting comfortably at the time of my visit.  Assessment & Plan:  Hypertensive emergency - severely uncontrolled HTN - refractory HTN Dosing of multiple medications has been limited by side effects/intolerance - likely also being driven by inability to tolerate full consistent dialysis treatments -multiple medications have been initiated and titrated during this admission - measure BP in L thigh only for consistency - aldosterone to plasma renin activity ratio not consistent with primary aldosteronism - TSH is not suppressed - has a renal artery US  in July 2025 which was negative for RAS - added minoxidil  9/28 with some improvement thus far  Klebsiella oxytoca bacteremia Noted on 2/2 blood cultures 9/25 obtained due to  recurring chills, with negative blood cultures prior on 9/11 - no signif fever or elevated WBC has been noted - ID has evaluated and left recommendations - antibiotic has been changed to cefepime  3 times per week with dialysis - Vascular Surgery has reevaluated the patient's left arm AVG and finds no evidence to suggest infection suggesting close monitoring of the hematoma for now - plan is to complete 10 days of cefepime  (being dosed as 2g every HD)  Chest pain - resolved  Has been evaluated by Cardiology with no need for further investigation   ESRD on HD MWF Using Right IJ TDC at present to rest the L AVG in setting of hematoma - care per Nephrology - patient has experienced multiple different reactions with attempts at ongoing dialysis - consideration is being given to transitioning to peritoneal dialysis as an outpatient  Chronic diastolic congestive heart failure EF 55-60% via TTE August 2025 with grade 2 diastolic dysfunction - volume management per dialysis - appears euvolemic at present  Hard 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 rate 11, height 5' 8 (1.727 m), weight 76.8 kg, SpO2 99%.  Intake/Output Summary (Last 24 hours) at  02/08/2024 0940 Last data filed at 02/08/2024 0211 Gross per 24 hour  Intake --  Output 1 ml  Net -1 ml   Filed Weights   02/07/24 0645 02/08/24 0631 02/08/24 0836  Weight: 76.6 kg 75.9 kg 76.8 kg    Examination: General: No acute respiratory distress Lungs: Clear to auscultation bilaterally  Cardiovascular: Regular rate and rhythm  without murmur  Abdomen: Thin, soft, BS positive, no rebound Extremities: No significant cyanosis, clubbing, or edema bilateral lower extremities   CBC: Recent Labs  Lab 02/04/24 0245 02/05/24 0813 02/08/24 0754  WBC 8.4 8.1 7.1  HGB 8.3* 9.0* 9.5*  HCT 26.7* 28.3* 29.7*  MCV 89.6 87.3 85.6  PLT 128* 145* 235   Basic Metabolic Panel: Recent Labs  Lab 02/04/24 0245 02/05/24 0813 02/08/24 0754  NA 130* 129* 132*  K 4.1 4.4 4.5  CL 93* 91* 90*  CO2 23 23 22   GLUCOSE 97 113* 128*  BUN 45* 70* 85*  CREATININE 8.64* 12.00* 14.82*  CALCIUM  8.2* 8.5* 9.0  PHOS 4.0 5.2* 5.2*   GFR: Estimated Creatinine Clearance: 6.7 mL/min (A) (by C-G formula based on SCr of 14.82 mg/dL (H)).   Scheduled Meds:  carvedilol   25 mg Oral BID   Chlorhexidine  Gluconate Cloth  6 each Topical Q0600   cinacalcet   30 mg Oral Q M,W,F-1800   clotrimazole    Topical BID   darbepoetin (ARANESP ) injection - DIALYSIS  150 mcg Subcutaneous Q Mon-1800   divalproex   500 mg Oral Daily   doxazosin   8 mg Oral Daily   heparin   5,000 Units Subcutaneous Q8H   hydrALAZINE   100 mg Oral TID   hydrocortisone  cream   Topical TID   isosorbide  mononitrate  20 mg Oral BID   losartan   100 mg Oral QPM   minoxidil   2.5 mg Oral BID   NIFEdipine   90 mg Oral BID   pantoprazole   40 mg Oral BID   sevelamer  carbonate  1,600 mg Oral TID WC   sodium zirconium cyclosilicate   10 g Oral Q M,W,F   spironolactone   100 mg Oral Daily      LOS: 19 days   Reyes IVAR Moores, MD [facility] Office  [phone] Pager - Text Page per Amion  If 7PM-7AM, please contact night-coverage per Amion 02/08/2024, 9:40 AM

## 2024-02-09 DIAGNOSIS — I169 Hypertensive crisis, unspecified: Secondary | ICD-10-CM | POA: Diagnosis not present

## 2024-02-09 LAB — GLUCOSE, CAPILLARY
Glucose-Capillary: 74 mg/dL (ref 70–99)
Glucose-Capillary: 116 mg/dL — ABNORMAL HIGH (ref 70–99)
Glucose-Capillary: 104 mg/dL — ABNORMAL HIGH (ref 70–99)
Glucose-Capillary: 118 mg/dL — ABNORMAL HIGH (ref 70–99)
Glucose-Capillary: 141 mg/dL — ABNORMAL HIGH (ref 70–99)

## 2024-02-09 NOTE — Progress Notes (Signed)
 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  He suffered rigors during HD and felt that he could not get warmed.  At presentation he was found to have a blood pressure of 233/139.  He was given labetalol  and a Cardene  drip was initiated.  CT head was with no acute findings.  He was initially admitted by the [facility] service.  Goals of Care:   Code Status: Full Code   DVT prophylaxis: heparin  injection 5,000 Units Start: 01/21/24 0600 SCDs Start: 01/20/24 2230   Interim Hx: Afebrile overnight.  Blood pressure controlled has improved with addition of minoxidil .  Avoid further titration at present as I did not want to acutely overcorrect him.  He remains afebrile.  Repeat blood cultures from 9/27 no growth x 3 days.  Reports that he is feeling better overall.  No new complaints today.  Assessment & Plan:  Hypertensive emergency - severely uncontrolled HTN - refractory HTN Dosing of multiple medications has been limited by side effects/intolerance - likely also being driven by inability to tolerate full consistent dialysis treatments -multiple medications have been initiated and titrated during this admission - measure BP in L thigh only for consistency - aldosterone to plasma renin activity ratio not consistent with primary aldosteronism - TSH is not suppressed - has a renal artery US  in July 2025 which was negative for RAS - added minoxidil  9/28 with some improvement thus far - wish to avoid acute overcorrection as this patient has a very long history of severely uncontrolled HTN  Klebsiella oxytoca bacteremia Noted on 2/2 blood cultures 9/25 obtained due to recurring chills, with  negative blood cultures prior on 9/11 - no signif fever or elevated WBC has been noted - ID has evaluated and left recommendations - antibiotic has been changed to cefepime  3 times per week with dialysis - Vascular Surgery has reevaluated the patient's left arm AVG and finds no evidence to suggest infection suggesting close monitoring of the hematoma for now - plan is to complete 10 days of cefepime  (being dosed as 2g every HD) and then monitor clinically thereafter  Chest pain - resolved  Has been evaluated by Cardiology with no need for further investigation   ESRD on HD MWF Using Right IJ TDC at present to rest the L AVG in setting of hematoma - care per Nephrology - patient has experienced multiple different reactions with attempts at ongoing dialysis - consideration is being given to transitioning to peritoneal dialysis as an outpatient  Chronic diastolic congestive heart failure EF 55-60% via TTE August 2025 with grade 2 diastolic dysfunction - volume management per dialysis - appears euvolemic at present  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 rate 19, height 5' 8 (1.727 m), weight 72.5 kg, SpO2 98%.  Intake/Output Summary (Last 24 hours) at 02/09/2024  1106 Last data filed at 02/08/2024 2300 Gross per 24 hour  Intake 340 ml  Output 3200 ml  Net -2860 ml   Filed Weights   02/08/24 1344 02/09/24 0500 02/09/24 0621  Weight: 72.8 kg 71.3 kg 72.5 kg    Examination: General: No acute respiratory distress Lungs: Clear to auscultation  bilaterally  Cardiovascular: Regular rate and rhythm without murmur  Abdomen: Thin, soft, BS positive, no rebound Extremities: No significant edema BLE   CBC: Recent Labs  Lab 02/04/24 0245 02/05/24 0813 02/08/24 0754  WBC 8.4 8.1 7.1  HGB 8.3* 9.0* 9.5*  HCT 26.7* 28.3* 29.7*  MCV 89.6 87.3 85.6  PLT 128* 145* 235   Basic Metabolic Panel: Recent Labs  Lab 02/04/24 0245 02/05/24 0813 02/08/24 0754  NA 130* 129* 132*  K 4.1 4.4 4.5  CL 93* 91* 90*  CO2 23 23 22   GLUCOSE 97 113* 128*  BUN 45* 70* 85*  CREATININE 8.64* 12.00* 14.82*  CALCIUM  8.2* 8.5* 9.0  PHOS 4.0 5.2* 5.2*   GFR: Estimated Creatinine Clearance: 6.7 mL/min (A) (by C-G formula based on SCr of 14.82 mg/dL (H)).   Scheduled Meds:  carvedilol   25 mg Oral BID   Chlorhexidine  Gluconate Cloth  6 each Topical Q0600   cinacalcet   30 mg Oral Q M,W,F-1800   clotrimazole    Topical BID   darbepoetin (ARANESP ) injection - DIALYSIS  150 mcg Subcutaneous Q Mon-1800   divalproex   500 mg Oral Daily   doxazosin   8 mg Oral Daily   heparin   5,000 Units Subcutaneous Q8H   hydrALAZINE   100 mg Oral TID   hydrocortisone  cream   Topical TID   isosorbide  mononitrate  20 mg Oral BID   losartan   100 mg Oral QPM   minoxidil   2.5 mg Oral BID   NIFEdipine   90 mg Oral BID   pantoprazole   40 mg Oral BID   sevelamer  carbonate  1,600 mg Oral TID WC   sodium zirconium cyclosilicate   10 g Oral Q M,W,F   spironolactone   100 mg Oral Daily      LOS: 20 days   Reyes IVAR Moores, MD [facility] Office  [phone] Pager - Text Page per Amion  If 7PM-7AM, please contact night-coverage per Amion 02/09/2024, 11:06 AM

## 2024-02-09 NOTE — Progress Notes (Signed)
 Physical Therapy Treatment and Discharge Patient Details Name: [name] MRN: [id number] DOB: 1985-07-04 Today's Date: 02/09/2024   History of Present Illness 38 yo male presenting 9/10 with weakness, chills, body aches and general malaise facial numbness x2 weeks. Recent admission 8/16 - 8/18 COVID (+) with hypertension urgency PMH: HTN, HFpEF, Alport syndrome, ESRD on HD ( MWFS) GERD, Anemia, bipolar 1, hearing loss, depression, tobacco use.    PT Comments  Doing very well, Mod I to fully independent with all mobility tasks performed today. Ambulating without assistive device, buckling or LOB. Challenged with gentle LE exercises and balance activities; handout provided. Encouraged OOB to mobilize with staff as tolerated to maintain strength/function during admission. All goals met. All questions answered. Pt complaints of GERD symptoms the past couple of days and having broken his hearing aide needs to follow up with supplier. Signing off acute PT services. Please re-order if there is a functional change in status. Have mobility staff work with pt as tolerated.      If plan is discharge home, recommend the following: Assist for transportation   Can travel by private vehicle        Equipment Recommendations  None recommended by PT    Recommendations for Other Services       Precautions / Restrictions Precautions Precautions: Fall Recall of Precautions/Restrictions: Impaired Precaution/Restrictions Comments: hoh Restrictions Weight Bearing Restrictions Per Provider Order: No     Mobility  Bed Mobility Overal bed mobility: Independent                  Transfers Overall transfer level: Modified independent Equipment used: None               General transfer comment: Stands and sits from bed without issues.    Ambulation/Gait Ambulation/Gait assistance: Modified independent (Device/Increase time) Gait Distance (Feet): 300 Feet Assistive device:  None Gait Pattern/deviations: Step-through pattern, Decreased stride length   Gait velocity interpretation: >2.62 ft/sec, indicative of community ambulatory   General Gait Details: Grossly stable without AD today. Fair pace, no buckling or overt LOB. Cues for safety and symptom awareness.   Stairs             Wheelchair Mobility     Tilt Bed    Modified Rankin (Stroke Patients Only)       Balance Overall balance assessment: Mild deficits observed, not formally tested                                          Communication Communication Communication: Impaired Factors Affecting Communication: Hearing impaired  Cognition Arousal: Alert Behavior During Therapy: WFL for tasks assessed/performed   PT - Cognitive impairments: No apparent impairments, Other                         Following commands: Intact      Cueing Cueing Techniques: Verbal cues  Exercises Other Exercises Other Exercises: Mini squat x5 EOB, mini lunge at sink x5 ea. Static balance with tandem, semi-tandem (eyes open eyes closed,) romberg head turns and nods (no issues.) Pt most challenged by tandem stance. Min A    General Comments        Pertinent Vitals/Pain Pain Assessment Pain Assessment: Faces Faces Pain Scale: Hurts little more Pain Location: throat - states from gerd Pain Descriptors / Indicators: Burning Pain Intervention(s): Monitored during  session    Home Living                          Prior Function            PT Goals (current goals can now be found in the care plan section) Acute Rehab PT Goals Patient Stated Goal: Get back into shape. Exercise again. PT Goal Formulation: All assessment and education complete, DC therapy Progress towards PT goals: Goals met/education completed, patient discharged from PT    Frequency    Min 2X/week      PT Plan      Co-evaluation              AM-PAC PT 6 Clicks Mobility    Outcome Measure  Help needed turning from your back to your side while in a flat bed without using bedrails?: None Help needed moving from lying on your back to sitting on the side of a flat bed without using bedrails?: None Help needed moving to and from a bed to a chair (including a wheelchair)?: None Help needed standing up from a chair using your arms (e.g., wheelchair or bedside chair)?: None Help needed to walk in hospital room?: None Help needed climbing 3-5 steps with a railing? : None 6 Click Score: 24    End of Session   Activity Tolerance: Patient tolerated treatment well Patient left: with call bell/phone within reach;in bed Nurse Communication: Mobility status PT Visit Diagnosis: Unsteadiness on feet (R26.81);Other abnormalities of gait and mobility (R26.89);Muscle weakness (generalized) (M62.81);Difficulty in walking, not elsewhere classified (R26.2);Pain     Time: 8576-8550 PT Time Calculation (min) (ACUTE ONLY): 26 min  Charges:    $Gait Training: 8-22 mins $Therapeutic Exercise: 8-22 mins PT General Charges $$ ACUTE PT VISIT: 1 Visit                     Leontine Roads, PT, DPT [facility]  Rehabilitation Services Physical Therapist Office: [phone] Website: [email or web address]    [name] 02/09/2024, 3:04 PM

## 2024-02-09 NOTE — Progress Notes (Signed)
 Physical Therapy Discharge Patient Details Name: [name] MRN: [id number] DOB: Sep 06, 1985 Today's Date: 02/09/2024 Time: 8576-8550 PT Time Calculation (min) (ACUTE ONLY): 26 min  Patient discharged from PT services secondary to goals met and no further PT needs identified.  Please see latest therapy progress note for current level of functioning and progress toward goals.    Progress and discharge plan discussed with patient and/or caregiver: Patient/Caregiver agrees with plan  Leontine Roads, PT, DPT [facility]  Rehabilitation Services Physical Therapist Office: [phone] Website: [email or web address]   [name] 02/09/2024, 3:07 PM

## 2024-02-09 NOTE — Progress Notes (Signed)
 [facility] Progress Note   Subjective:  Seen in room. BP a little better today. S.p HD yesterday with 2L off. He reports that had a little hypoglycemia last night - feels ok now.  Objective Vitals:   02/09/24 0621 02/09/24 0738 02/09/24 0838 02/09/24 1148  BP:  (!) 173/82 (!) 173/82 (!) 168/82  Pulse:  78 88 80  Resp:  19  18  Temp:  98.1 F (36.7 C)  98 F (36.7 C)  TempSrc:  Oral  Oral  SpO2:  98%  99%  Weight: 72.5 kg     Height:       Physical Exam General: Well appearing man, NAD Heart: RRR Lungs: Bibasilar rales Abdomen: soft Extremities: trace BLE and LLE edema Dialysis Access: TDC, L AVG with hematoma which is tender  Additional Objective Labs: Basic Metabolic Panel: Recent Labs  Lab 02/04/24 0245 02/05/24 0813 02/08/24 0754  NA 130* 129* 132*  K 4.1 4.4 4.5  CL 93* 91* 90*  CO2 23 23 22   GLUCOSE 97 113* 128*  BUN 45* 70* 85*  CREATININE 8.64* 12.00* 14.82*  CALCIUM  8.2* 8.5* 9.0  PHOS 4.0 5.2* 5.2*   Liver Function Tests: Recent Labs  Lab 02/04/24 0245 02/05/24 0813 02/08/24 0754  ALBUMIN  2.9* 3.2* 3.1*   CBC: Recent Labs  Lab 02/03/24 0235 02/04/24 0245 02/05/24 0813 02/08/24 0754  WBC 5.9 8.4 8.1 7.1  HGB 8.4* 8.3* 9.0* 9.5*  HCT 26.7* 26.7* 28.3* 29.7*  MCV 88.1 89.6 87.3 85.6  PLT 201 128* 145* 235   Blood Culture    Component Value Date/Time   SDES BLOOD RIGHT HAND 02/06/2024 0825   SPECREQUEST  02/06/2024 0825    BOTTLES DRAWN AEROBIC AND ANAEROBIC Blood Culture results may not be optimal due to an inadequate volume of blood received in culture bottles   CULT  02/06/2024 0825    NO GROWTH 3 DAYS Performed at [facility], [address] [place], [place] [address]    REPTSTATUS PENDING 02/06/2024 0825   Medications:  ceFEPime  (MAXIPIME ) IV Stopped (02/08/24 1603)   promethazine  (PHENERGAN ) injection (IM or IVPB)      carvedilol   25 mg Oral BID   Chlorhexidine  Gluconate Cloth  6 each Topical Q0600    cinacalcet   30 mg Oral Q M,W,F-1800   clotrimazole    Topical BID   darbepoetin (ARANESP ) injection - DIALYSIS  150 mcg Subcutaneous Q Mon-1800   divalproex   500 mg Oral Daily   doxazosin   8 mg Oral Daily   heparin   5,000 Units Subcutaneous Q8H   hydrALAZINE   100 mg Oral TID   hydrocortisone  cream   Topical TID   isosorbide  mononitrate  20 mg Oral BID   losartan   100 mg Oral QPM   minoxidil   2.5 mg Oral BID   NIFEdipine   90 mg Oral BID   pantoprazole   40 mg Oral BID   sevelamer  carbonate  1,600 mg Oral TID WC   sodium zirconium cyclosilicate   10 g Oral Q M,W,F   spironolactone   100 mg Oral Daily    Dialysis Orders MWF - NW 4hrs, 350/A1.5, EDW 73.8kg, 2K/2Ca bath, TDC, no heparin  - Mircera 200mcg IV q 2 weeks - last 9/8 - Calcitriol  0.75mcg PO q HD - Sensipar  30mg  q HD   Assessment/Plan: HTN emergency: Ongoing med escalation, pulling fluid with HD. Remains volume up. Negative secondary work-up per notes. Currently on Coreg  25mg  BID, doxazosin  8mg  every day, hydralazine  100mg  TID, nifedipine  90mg   BID, isosorbide  20mg  BID, losartan  100mg  at bedtime, spironolactone  100mg  at bedtime, minoxidil  2.5mg  BID. Dialyzer reaction: Now unclear if reaction, may have been early signs of bacteremia. Using hypoallergenic dialyzer and pre-medicating with benadryl  - follow. Klebsiella oxytoca/Enterobacter bacteremia: Blood Cx + 9/25, on Cefepime . Repeat blood Cx 9/27 negative so far. Plan is 2 weeks of IV Cefepime  (through 10/9) - not exchanging TDC at this juncture. ESRD: Continue HD on MWF schedule - next HD tomorrow - keep lowering EDW as tolerated. Anemia of ESRD: Hgb 9.5 - continue ESA q Monday. Secondary HPTH: Ca/Phos ok. Continue home meds. Nutrition: Alb low, continue ^ protein renal diet.    Izetta Boehringer, PA-C 02/09/2024, 1:49 PM  [employer]

## 2024-02-10 ENCOUNTER — Other Ambulatory Visit (HOSPITAL_COMMUNITY): Payer: Self-pay

## 2024-02-10 LAB — GLUCOSE, CAPILLARY
Glucose-Capillary: 74 mg/dL (ref 70–99)
Glucose-Capillary: 128 mg/dL — ABNORMAL HIGH (ref 70–99)
Glucose-Capillary: 102 mg/dL — ABNORMAL HIGH (ref 70–99)

## 2024-02-10 LAB — CBC
HCT: 30.9 % — ABNORMAL LOW (ref 39.0–52.0)
Hemoglobin: 9.8 g/dL — ABNORMAL LOW (ref 13.0–17.0)
MCH: 27.2 pg (ref 26.0–34.0)
MCHC: 31.7 g/dL (ref 30.0–36.0)
MCV: 85.8 fL (ref 80.0–100.0)
Platelets: 283 K/uL (ref 150–400)
RBC: 3.6 MIL/uL — ABNORMAL LOW (ref 4.22–5.81)
RDW: 16.4 % — ABNORMAL HIGH (ref 11.5–15.5)
WBC: 5.5 K/uL (ref 4.0–10.5)
nRBC: 0 % (ref 0.0–0.2)

## 2024-02-10 LAB — RENAL FUNCTION PANEL
Albumin: 3.2 g/dL — ABNORMAL LOW (ref 3.5–5.0)
Anion gap: 17 — ABNORMAL HIGH (ref 5–15)
BUN: 55 mg/dL — ABNORMAL HIGH (ref 6–20)
CO2: 26 mmol/L (ref 22–32)
Calcium: 9.2 mg/dL (ref 8.9–10.3)
Chloride: 90 mmol/L — ABNORMAL LOW (ref 98–111)
Creatinine, Ser: 13.2 mg/dL — ABNORMAL HIGH (ref 0.61–1.24)
GFR, Estimated: 5 mL/min — ABNORMAL LOW (ref >60)
Glucose, Bld: 101 mg/dL — ABNORMAL HIGH (ref 70–99)
Phosphorus: 4.4 mg/dL (ref 2.5–4.6)
Potassium: 4.2 mmol/L (ref 3.5–5.1)
Sodium: 133 mmol/L — ABNORMAL LOW (ref 135–145)

## 2024-02-10 MED ORDER — CALCIUM CARBONATE ANTACID 500 MG PO CHEW
2.0000 | CHEWABLE_TABLET | Freq: Three times a day (TID) | ORAL | 2 refills | Status: DC
  Filled 2024-02-10: qty 150, 25d supply, fill #0

## 2024-02-10 MED ORDER — OXYCODONE HCL 5 MG PO TABS
ORAL_TABLET | ORAL | Status: AC
  Filled 2024-02-10: qty 2

## 2024-02-10 MED ORDER — HEPARIN SODIUM (PORCINE) 1000 UNIT/ML IJ SOLN
INTRAMUSCULAR | Status: AC
  Filled 2024-02-10: qty 4

## 2024-02-10 MED ORDER — CALCIUM CARBONATE ANTACID 500 MG PO CHEW
CHEWABLE_TABLET | ORAL | Status: AC
  Filled 2024-02-10: qty 1

## 2024-02-10 MED ORDER — SODIUM CHLORIDE 0.9 % IV SOLN: 2.0000 g | INTRAVENOUS | Status: AC

## 2024-02-10 MED ORDER — ISOSORBIDE MONONITRATE 20 MG PO TABS
20.0000 mg | ORAL_TABLET | Freq: Two times a day (BID) | ORAL | 2 refills | Status: DC
  Filled 2024-02-10: qty 180, 90d supply, fill #0

## 2024-02-10 MED ORDER — MINOXIDIL 2.5 MG PO TABS
2.5000 mg | ORAL_TABLET | Freq: Two times a day (BID) | ORAL | 2 refills | Status: AC
  Filled 2024-02-10: qty 60, 30d supply, fill #0

## 2024-02-10 MED ORDER — CLOTRIMAZOLE 1 % EX CREA
TOPICAL_CREAM | Freq: Two times a day (BID) | CUTANEOUS | 0 refills | Status: DC
  Filled 2024-02-10: qty 28, 15d supply, fill #0

## 2024-02-10 MED ORDER — SPIRONOLACTONE 100 MG PO TABS
100.0000 mg | ORAL_TABLET | Freq: Every day | ORAL | 2 refills | Status: DC
  Filled 2024-02-10: qty 30, 30d supply, fill #0

## 2024-02-10 MED ORDER — HEPARIN SODIUM (PORCINE) 1000 UNIT/ML IJ SOLN
3200.0000 [IU] | Freq: Once | INTRAMUSCULAR | Status: AC
  Administered 2024-02-10: 3200 [IU]

## 2024-02-10 MED ORDER — CARVEDILOL 25 MG PO TABS
25.0000 mg | ORAL_TABLET | Freq: Two times a day (BID) | ORAL | 2 refills | Status: AC
  Filled 2024-02-10: qty 60, 30d supply, fill #0

## 2024-02-10 MED ORDER — HEPARIN SODIUM (PORCINE) 1000 UNIT/ML IJ SOLN
INTRAMUSCULAR | Status: AC
  Filled 2024-02-10: qty 7

## 2024-02-10 MED ORDER — ACETAMINOPHEN 500 MG PO TABS
500.0000 mg | ORAL_TABLET | Freq: Four times a day (QID) | ORAL | 0 refills | Status: AC | PRN
  Filled 2024-02-10: qty 30, 8d supply, fill #0

## 2024-02-10 MED ORDER — CINACALCET HCL 30 MG PO TABS
30.0000 mg | ORAL_TABLET | ORAL | 2 refills | Status: AC
  Filled 2024-02-10: qty 13, 30d supply, fill #0

## 2024-02-10 MED ORDER — HEPARIN SODIUM (PORCINE) 1000 UNIT/ML DIALYSIS
4000.0000 [IU] | Freq: Once | INTRAMUSCULAR | Status: AC
  Administered 2024-02-10: 4000 [IU] via INTRAVENOUS_CENTRAL

## 2024-02-10 NOTE — TOC Transition Note (Signed)
 Transition of Care (TOC) - Discharge Note Rayfield Gobble RN, BSN Inpatient Care Management Unit 4E- RN Case Manager See Treatment Team for direct phone #   Patient Details  Name: [name] MRN: [id number] Date of Birth: February 19, 1986  Transition of Care [facility]) CM/SW Contact:  Gobble Rayfield Hurst, RN Phone Number: 02/10/2024, 3:13 PM   Clinical Narrative:    Pt stable for transition home today, family to transport home.  No DME needs noted.  Updated therapy recs to outpt PT/OT.  CM spoke with pt at bedside- pt voiced that he would like to get stronger and do outpt therapy- discussed locations- address verified. Pt agreeable to referral at nearest [facility] outpt location to his home.  Verbal order given per MD.   Referral made to [facility] outpt [place] location for PT/OT needs- ref# [id number].   No further IP care management needs noted.    Final next level of care: OP Rehab Barriers to Discharge: Barriers Resolved   Patient Goals and CMS Choice Patient states their goals for this hospitalization and ongoing recovery are:: wants to be able to go home CMS Medicare.gov Compare Post Acute Care list provided to:: Patient Represenative (must comment) Choice offered to / list presented to : [facility] POA / Guardian      Discharge Placement               Home        Discharge Plan and Services Additional resources added to the After Visit Summary for     Discharge Planning Services: CM Consult Post Acute Care Choice: Home Health          DME Arranged: N/A DME Agency: NA       HH Arranged: NA HH Agency: NA        Social Drivers of Health (SDOH) Interventions SDOH Screenings   Food Insecurity: No Food Insecurity (01/21/2024)  Housing: Low Risk  (01/21/2024)  Transportation Needs: No Transportation Needs (01/21/2024)  Utilities: Not At Risk (01/21/2024)  Depression (PHQ2-9): Medium Risk (06/02/2023)  Physical Activity: Inactive (06/02/2023)  Social Connections: Unknown  (09/20/2021)   Received from [facility]  Stress: Stress Concern Present (06/02/2023)  Tobacco Use: High Risk (01/14/2024)     Readmission Risk Interventions    02/10/2024    3:12 PM 12/28/2023    1:41 PM  Readmission Risk Prevention Plan  Transportation Screening Complete Complete  PCP or Specialist Appt within 3-5 Days  Complete  HRI or Home Care Consult  Complete  Social Work Consult for Recovery Care Planning/Counseling  Complete  Palliative Care Screening  Not Applicable  Medication Review Oceanographer)  Referral to Pharmacy  HRI or Home Care Consult Complete   SW Recovery Care/Counseling Consult Complete   Palliative Care Screening Not Applicable   Skilled Nursing Facility Not Applicable

## 2024-02-10 NOTE — Progress Notes (Signed)
 [facility] Progress Note   Subjective:   Seen in [facility]. On dialysis  No c/os. Denies f/c, cp, sob Says he's going home today   Objective Vitals:   02/10/24 0610 02/10/24 0744 02/10/24 0838 02/10/24 0844  BP: (!) 161/76 (!) 174/113    Pulse:  83  89  Resp:  18  14  Temp:  97.9 F (36.6 C)  (!) 97.3 F (36.3 C)  TempSrc:  Oral    SpO2:  100%  99%  Weight:   74.3 kg   Height:       Physical Exam General: Well appearing man, NAD Heart: RRR Lungs: Bibasilar rales Abdomen: soft Extremities: trace BLE and LLE edema Dialysis Access: TDC, L AVG with hematoma which is tender  Additional Objective Labs: Basic Metabolic Panel: Recent Labs  Lab 02/05/24 0813 02/08/24 0754 02/10/24 0755  NA 129* 132* 133*  K 4.4 4.5 4.2  CL 91* 90* 90*  CO2 23 22 26   GLUCOSE 113* 128* 101*  BUN 70* 85* 55*  CREATININE 12.00* 14.82* 13.20*  CALCIUM  8.5* 9.0 9.2  PHOS 5.2* 5.2* 4.4   Liver Function Tests: Recent Labs  Lab 02/05/24 0813 02/08/24 0754 02/10/24 0755  ALBUMIN  3.2* 3.1* 3.2*   CBC: Recent Labs  Lab 02/04/24 0245 02/05/24 0813 02/08/24 0754 02/10/24 0755  WBC 8.4 8.1 7.1 5.5  HGB 8.3* 9.0* 9.5* 9.8*  HCT 26.7* 28.3* 29.7* 30.9*  MCV 89.6 87.3 85.6 85.8  PLT 128* 145* 235 283   Blood Culture    Component Value Date/Time   SDES BLOOD RIGHT HAND 02/06/2024 0825   SPECREQUEST  02/06/2024 0825    BOTTLES DRAWN AEROBIC AND ANAEROBIC Blood Culture results may not be optimal due to an inadequate volume of blood received in culture bottles   CULT  02/06/2024 0825    NO GROWTH 4 DAYS Performed at [facility], [address] [place], [place] [address]    REPTSTATUS PENDING 02/06/2024 0825   Medications:  ceFEPime  (MAXIPIME ) IV Stopped (02/08/24 1603)   promethazine  (PHENERGAN ) injection (IM or IVPB) 12.5 mg (02/09/24 1758)    carvedilol   25 mg Oral BID   Chlorhexidine  Gluconate Cloth  6 each Topical Q0600   cinacalcet   30 mg Oral Q  M,W,F-1800   clotrimazole    Topical BID   darbepoetin (ARANESP ) injection - DIALYSIS  150 mcg Subcutaneous Q Mon-1800   divalproex   500 mg Oral Daily   doxazosin   8 mg Oral Daily   heparin   5,000 Units Subcutaneous Q8H   hydrALAZINE   100 mg Oral TID   hydrocortisone  cream   Topical TID   isosorbide  mononitrate  20 mg Oral BID   losartan   100 mg Oral QPM   minoxidil   2.5 mg Oral BID   NIFEdipine   90 mg Oral BID   pantoprazole   40 mg Oral BID   sevelamer  carbonate  1,600 mg Oral TID WC   sodium zirconium cyclosilicate   10 g Oral Q M,W,F   spironolactone   100 mg Oral Daily    Dialysis Orders MWF - [facility] 4hrs, 350/A1.5, EDW 73.8kg, 2K/2Ca bath, TDC, no heparin  - Mircera 200mcg IV q 2 weeks - last 9/8 - Calcitriol  0.75mcg PO q HD - Sensipar  30mg  q HD   Assessment/Plan: HTN emergency: Ongoing med escalation, pulling fluid with HD. Remains volume up. Negative secondary work-up per notes. Currently on Coreg  25mg  BID, doxazosin  8mg  every day, hydralazine  100mg  TID, nifedipine  90mg  BID, isosorbide  20mg  BID, losartan  100mg   at bedtime, spironolactone  100mg  at bedtime, minoxidil  2.5mg  BID. Dialyzer reaction: Now unclear if reaction, may have been early signs of bacteremia. Using hypoallergenic dialyzer and pre-medicating with benadryl  - follow. Klebsiella oxytoca/Enterobacter bacteremia: Blood Cx + 9/25, on Cefepime . Repeat blood Cx 9/27 negative so far. Plan is 2 weeks of IV Cefepime  (through 10/9) - not exchanging TDC at this juncture. ESRD: Continue HD on MWF schedule - HD today --  keep lowering EDW as tolerated. Anemia of ESRD: Hgb 9.5 - continue ESA q Monday. Secondary HPTH: Ca/Phos ok. Continue home meds. Nutrition: Alb low, continue ^ protein renal diet.    Maisie Ronnald Acosta PA-C [facility] 02/10/2024,8:51 AM

## 2024-02-10 NOTE — Progress Notes (Signed)
 D/C order noted. Contacted [facility] to be advised of pt's d/c today and that pt should resume care on Friday.   Randine Mungo Dialysis Navigator [phone]

## 2024-02-10 NOTE — Progress Notes (Addendum)
 Received patient in bed to unit.  Alert and oriented.  Informed consent signed and in chart.   TX duration:3 hours per Dr. Geralynn  Patient tolerated well.  Transported back to the room  Alert, without acute distress.  Hand-off given to patient's nurse.   Access used: Right internal jugular HD cath Access issues: A-V and V-A  Total UF removed: 2.5L Medication(s) given: Oxycodone , TUmS   02/10/24 1206  Vitals  Temp 98.4 F (36.9 C)  Temp Source Oral  BP (!) 173/89  Pulse Rate 81  ECG Heart Rate 80  Resp 12  Weight 72.1 kg (standing)  Type of Weight Post-Dialysis  Oxygen Therapy  SpO2 100 %  O2 Device Room Air  During Treatment Monitoring  Blood Flow Rate (mL/min) 399 mL/min  Arterial Pressure (mmHg) -11.92 mmHg  Venous Pressure (mmHg) 144.64 mmHg  TMP (mmHg) 32.32 mmHg  Ultrafiltration Rate (mL/min) 1382 mL/min  Dialysate Flow Rate (mL/min) 300 ml/min  Duration of HD Treatment -hour(s) 3 hour(s)  Cumulative Fluid Removed (mL) per Treatment  2483.61  HD Safety Checks Performed Yes  Intra-Hemodialysis Comments Tx completed  Post Treatment  Dialyzer Clearance Lightly streaked  Liters Processed 72  Fluid Removed (mL) 3500 mL  Tolerated HD Treatment Yes  Post-Hemodialysis Comments A-V and V-A  Hemodialysis Catheter Right Internal jugular Double lumen Permanent (Tunneled)  Placement Date/Time: 04/03/23 1228   Serial / Lot #: [id number]  Expiration Date: 10/10/27  Time Out: Correct patient;Correct site;Correct procedure  Maximum sterile barrier precautions: Hand hygiene;Cap;Mask;Sterile gown;Sterile gloves;Large sterile s...  Site Condition No complications  Blue Lumen Status Flushed;Antimicrobial dead end cap;Heparin  locked  Red Lumen Status Flushed;Antimicrobial dead end cap;Heparin  locked  Purple Lumen Status N/A  Catheter fill solution Heparin  1000 units/ml  Catheter fill volume (Arterial) 3.1 cc  Catheter fill volume (Venous) 3.1  Dressing Type Transparent   Dressing Status Antimicrobial disc/dressing in place;Clean, Dry, Intact  Drainage Description None  Dressing Change Due 02/15/24  Post treatment catheter status Capped and Clamped     Camellia Brasil LPN Kidney Dialysis Unit

## 2024-02-11 ENCOUNTER — Telehealth: Payer: Self-pay | Admitting: Nephrology

## 2024-02-11 LAB — CULTURE, BLOOD (ROUTINE X 2)
Culture: NO GROWTH
Culture: NO GROWTH

## 2024-02-11 NOTE — Discharge Planning (Signed)
 [facility] Patient Discharge Orders- [facility] CLINIC: [place]  Patient's name: [name] Admit/DC Dates: 01/20/2024 - 02/10/2024  Discharge Diagnoses: Hypertensive emergency   Klebsiella/Enterobacter bacteremia   Outpatient Dialysis Orders:  -Heparin : No change  -EDW 72.5 kg   -Bath: No change   Anemia Aranesp : Given: Y   Date of last dose/amount: 9/29  150 mcg    PRBC's Given: --- Date/# of units: -- ESA dose for discharge: Mircera 150 mcg IV q 2 weeks   Recent Labs  Lab 02/10/24 0755  HGB 9.8*  K 4.2  CALCIUM  9.2  PHOS 4.4  ALBUMIN  3.2*   Access intervention/Change:  Fistulogram 01/27/24 per [facility] - no flow limiting stenosis. Recommended to rest graft    Medications/Labs: -IV Antibiotics: Cefepime  2g IV q HD until 02/18/24 for bacteremia    OTHER/APPTS   Completed by: Maisie Ronnald Acosta PA-C   D/C Meds to be reconciled by nurse after every discharge.    Reviewed by: MD:______ RN_______

## 2024-02-11 NOTE — Telephone Encounter (Signed)
 Transition of care contact from inpatient facility  Date of Discharge:  Date of Contact: Method of contact: Phone  Attempted to contact patient to discuss transition of care from inpatient admission. Patient did not answer the phone. Unable to leave voicemail. Will continue outreach attempts.

## 2024-02-12 NOTE — Discharge Summary (Signed)
 Physician Discharge Summary  [name] FMW:[id number] DOB: 1986-02-11 DOA: 01/20/2024  PCP: Lonnie Earnest, MD  Admit date: 01/20/2024 Discharge date: 02/10/2024 Admitted From: Home Disposition: Home Recommendations for Outpatient Follow-up:  Follow up with PCP in 1-2 weeks Please obtain BMP/CBC in one week Continue cefepime  2 g IV on dialysis days till 02/18/2024 for bacteremia Klebsiella and Enterobacter  Home Health: None Equipment/Devices: None Discharge Condition: Stable CODE STATUS: Full code Diet recommendation: Renal diet Brief/Interim Summary: 38 year old with a history of HTN, diastolic CHF, Alport syndrome, 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 He suffered rigors during HD and felt that he could not get warmed. At presentation he was found to have a blood pressure of 233/139. He was given labetalol  and a Cardene  drip was initiated. CT head was with no acute findings. He was initially admitted by the [facility] service.    Discharge Diagnoses:  Principal Problem:   Hypertensive crisis  Hypertensive emergency - severely uncontrolled HTN - refractory HTN Dosing of multiple medications has been limited by side effects/intolerance - likely also being driven by inability to tolerate full consistent dialysis treatments -multiple medications have been initiated and titrated during this admission - measure BP in L thigh only for consistency - aldosterone to plasma renin activity ratio not consistent with primary aldosteronism - TSH is not suppressed - has a renal artery US  in July 2025 which was negative for RAS - added minoxidil  9/28 with some improvement.   Klebsiella oxytoca bacteremia Noted on 2/2 blood cultures 9/25 obtained due to recurring chills, with negative blood cultures prior on 9/11 - no signif fever or elevated WBC has been  noted - ID has evaluated and left recommendations - antibiotic has been changed to cefepime  3 times per week with dialysis - Vascular Surgery has reevaluated the patient's left arm AVG and finds no evidence to suggest infection suggesting close monitoring of the hematoma for now - plan is to complete 10 days of cefepime  (being dosed as 2g every HD) and then monitor clinically thereafter.  Last day of antibiotics 02/18/2024.   Chest pain - resolved  Has been evaluated by Cardiology with no need for further investigation    ESRD on HD MWF Using Right IJ TDC at present to rest the L AVG in setting of hematoma - care per Nephrology - patient has experienced multiple different reactions with attempts at ongoing dialysis - consideration is being given to transitioning to peritoneal dialysis as an outpatient   Chronic diastolic congestive heart failure EF 55-60% via TTE August 2025 with grade 2 diastolic dysfunction - volume management per dialysis - appears euvolemic on discharge.    Hard of hearing Due to Alport syndrome -able to communicate effectively using hearing aids   Bipolar disorder with depression Continue usual Depakote    Tobacco abuse Has been counseled on absolute need to discontinue smoking   Estimated body mass index is 24.17 kg/m as calculated from the following:   Height as of this encounter: 5' 8 (1.727 m).   Weight as of this encounter: 72.1 kg.  Discharge Instructions  Discharge Instructions     Diet - low sodium heart healthy   Complete by: As directed    Increase activity slowly   Complete by: As directed       Allergies as of 02/10/2024       Reactions  Nsaids Other (See Comments)   Contraindication due to ckd    Zestril [lisinopril] Swelling   Risperdal  [risperidone ] Other (See Comments)   Unknown reaction         Medication List     STOP taking these medications    doxazosin  8 MG tablet Commonly known as: CARDURA    gabapentin  100 MG  capsule Commonly known as: Neurontin    hydrALAZINE  100 MG tablet Commonly known as: APRESOLINE    hydrOXYzine  10 MG tablet Commonly known as: ATARAX        TAKE these medications    Acetaminophen  Extra Strength 500 MG Tabs Take 1 tablet (500 mg total) by mouth every 6 (six) hours as needed for mild pain (pain score 1-3) or fever.   carvedilol  25 MG tablet Commonly known as: COREG  Take 1 tablet (25 mg total) by mouth 2 (two) times daily with a meal.   ceFEPIme  2 g in sodium chloride  0.9 % 100 mL Inject 2 g into the vein every Monday, Wednesday, and Friday with hemodialysis for 9 days. Indication:  bacteremia First Dose: Yes Last Day of Therapy:  02/19/2024   cinacalcet  30 MG tablet Commonly known as: SENSIPAR  Take 1 tablet (30 mg total) by mouth every Monday, Wednesday, and Friday at 6 PM.   Clotrimazole  Anti-Fungal 1 % cream Generic drug: clotrimazole  Apply topically 2 (two) times daily.   divalproex  500 MG 24 hr tablet Commonly known as: DEPAKOTE  ER Take 1 tablet (500 mg total) by mouth daily.   isosorbide  mononitrate 20 MG tablet Commonly known as: ISMO  Take 1 tablet (20 mg total) by mouth 2 (two) times daily.   losartan  100 MG tablet Commonly known as: COZAAR  Take 100 mg by mouth daily.   minoxidil  2.5 MG tablet Commonly known as: LONITEN  Take 1 tablet (2.5 mg total) by mouth 2 (two) times daily.   NIFEdipine  90 MG 24 hr tablet Commonly known as: ADALAT  CC Take 90 mg by mouth in the morning and at bedtime.   pantoprazole  40 MG tablet Commonly known as: PROTONIX  Take 1 tablet (40 mg total) by mouth daily.   sevelamer  carbonate 800 MG tablet Commonly known as: RENVELA  Take 2 tablets (1,600 mg total) by mouth 3 (three) times daily with meals.   spironolactone  100 MG tablet Commonly known as: ALDACTONE  Take 1 tablet (100 mg total) by mouth daily.        Follow-up Information     [facility] Follow up.   Specialty:  Rehabilitation Why: referral made for outpt PT/OT- they will contact you to schedule Contact information: [address] [place]  05/28/1985 [phone]               Allergies  Allergen Reactions   Nsaids Other (See Comments)    Contraindication due to ckd    Zestril [Lisinopril] Swelling   Risperdal  [Risperidone ] Other (See Comments)    Unknown reaction     Consultations: Vascular, infectious disease, renal   Procedures/Studies: VAS US  DUPLEX DIALYSIS ACCESS (AVF, AVG) Result Date: 02/07/2024 DIALYSIS ACCESS Patient Name:  [name] [facility]  Date of Exam:   02/06/2024 Medical Rec #: [id number]        Accession #:    [id number] Date of Birth: [birth year]        Patient Gender: M Patient Age:   72 years Exam Location:  [facility] Procedure:      VAS US  DUPLEX DIALYSIS ACCESS (AVF, AVG) Referring Phys: [facility] --------------------------------------------------------------------------------  Reason for Exam: Follow-up of hematoma of left arm found 01/22/24. Access Site: Left Upper Extremity. Access Type: AV graft. History: Fistulogram of left arm AV graft done 01/27/24 indicating no          flow-limiting stenosis and widely patent anastomosis both proximally          and distally. No outflow stenosis. Comparison Study: 01/22/24 Performing Technologist: Alberta Lis RVS  Examination Guidelines: A complete evaluation includes B-mode imaging, spectral Doppler, color Doppler, and power Doppler as needed of all accessible portions of each vessel. Unilateral testing is considered an integral part of a complete examination. Limited examinations for reoccurring indications may be performed as noted.  Findings:   Patent graft. Prior hematoma noted 01/22/24, remains. No perigraft fluid noted.  Summary: Hematoma still present in left upper arm. No perigraft fluid noted.  *See table(s) above for measurements and observations.  Diagnosing physician: Lonni Gaskins MD Electronically signed by Lonni Gaskins MD on 02/07/2024 at 9:19:38 AM.    --------------------------------------------------------------------------------   Final    ECHOCARDIOGRAM LIMITED Result Date: 01/30/2024    ECHOCARDIOGRAM LIMITED REPORT   Patient Name:   [name] [facility] Date of Exam: 01/30/2024 Medical Rec #:  [id number]       Height:       68.0 in Accession #:    [id number]      Weight:       177.2 lb Date of Birth:  [birth year]       BSA:          1.941 m Patient Age:    38 years        BP:           204/119 mmHg Patient Gender: M               HR:           74 bpm. Exam Location:  Inpatient Procedure: Limited Echo (Both Spectral and Color Flow Doppler were utilized            during procedure). Indications:    pericardial effusion  History:        Patient has prior history of Echocardiogram examinations, most                 recent 12/28/2023. End stage renal disease; Risk Factors:Current                 Smoker and Hypertension.  Sonographer:    Tinnie Barefoot RDCS Referring Phys: [phone] JEFFREY H WALDEN IMPRESSIONS Limited echo for pericardial effusion  1. Left ventricular ejection fraction, by estimation, is 60 to 65%. Left ventricular ejection fraction by PLAX is 65 %. The left ventricle has normal function. The left ventricle has no regional wall motion abnormalities. There is moderate left ventricular hypertrophy.  2. There is no evidence of cardiac tamponade.  3. The aortic valve is tricuspid. Aortic valve regurgitation is not visualized.  4. Moderately dilated pulmonary artery.  5. There is normal pulmonary artery systolic pressure. The estimated right ventricular systolic pressure is 28.8 mmHg.  6. The inferior vena cava is normal in size with greater than 50% respiratory variability, suggesting right atrial pressure of 3 mmHg.  7. Trivial circumferential pericardial effusion. Comparison(s): Changes from prior study are noted. 12/28/2023: LVEF 55-60%, severe LVH, small pericardial  effusion near the RV. Compared to this prior study, the pericardial effusion is smaller (now trivial in size). FINDINGS  Left Ventricle: Left ventricular ejection fraction, by estimation, is 60  to 65%. Left ventricular ejection fraction by PLAX is 65 %. The left ventricle has normal function. The left ventricle has no regional wall motion abnormalities. The left ventricular internal cavity size was normal in size. There is moderate left ventricular hypertrophy. Right Ventricle: There is normal pulmonary artery systolic pressure. The tricuspid regurgitant velocity is 2.54 m/s, and with an assumed right atrial pressure of 3 mmHg, the estimated right ventricular systolic pressure is 28.8 mmHg. Pericardium: Trivial pericardial effusion is present. The pericardial effusion is circumferential. There is no evidence of cardiac tamponade. Tricuspid Valve: The tricuspid valve is grossly normal. Tricuspid valve regurgitation is mild. Aortic Valve: The aortic valve is tricuspid. Aortic valve regurgitation is not visualized. Pulmonary Artery: The pulmonary artery is moderately dilated. Venous: The inferior vena cava is normal in size with greater than 50% respiratory variability, suggesting right atrial pressure of 3 mmHg. Additional Comments: Spectral Doppler performed. Color Doppler performed.  LEFT VENTRICLE PLAX 2D LV EF:         Left ventricular ejection fraction by PLAX is 65 %. LVIDd:         5.00 cm LVIDs:         3.20 cm LV PW:         1.40 cm LV IVS:        1.30 cm  IVC IVC diam: 1.90 cm LEFT ATRIUM         Index LA diam:    4.20 cm 2.16 cm/m  AORTIC VALVE LVOT Vmax:   135.00 cm/s LVOT Vmean:  91.300 cm/s LVOT VTI:    0.246 m MITRAL VALVE                TRICUSPID VALVE MV Area (PHT): 3.91 cm     TR Peak grad:   25.8 mmHg MV Decel Time: 194 msec     TR Vmax:        254.00 cm/s MV E velocity: 106.00 cm/s MV A velocity: 44.00 cm/s   SHUNTS MV E/A ratio:  2.41         Systemic VTI: 0.25 m Vinie Maxcy MD Electronically  signed by Vinie Maxcy MD Signature Date/Time: 01/30/2024/4:42:25 PM    Final    PERIPHERAL VASCULAR CATHETERIZATION Result Date: 01/27/2024 Images from the original result were not included.   Patient name: [name]        MRN: [id number]        DOB: [birth year]          Sex: male  01/27/2024 Pre-operative Diagnosis: Fistula malfunction Post-operative diagnosis:  Same Surgeon:  Fonda FORBES Rim, MD Procedure Performed: 1.  Ultrasound-guided micropuncture access of the left arm AV graft 2.  Fistulogram  Indications: Patient is a 38 year old male with history of multiple access procedures for end-stage renal disease requiring dialysis.  He is currently being dialyzed through a left arm AV graft.  They have had difficulty with cannulation and there is a hematoma present.  After discussing the risks and benefits of left arm fistulagram in an effort to assess flow, the patient elected to proceed.  Findings:  No flow-limiting stenosis within the left arm AV graft.  Widely patent anastomosis both proximally and distally.  No outflow stenosis.             Procedure:  The patient was identified in the holding area and taken to room 8.  The patient was then placed supine on the table and prepped and draped in the usual sterile fashion.  A time out was called.  Ultrasound was used to evaluate the left arm AV graft.  This was accessed using a micropuncture needle, and exchanged for a micropuncture sheath using Seldinger technique.  Left arm fistulogram followed.  See results above.  No intervention.  Recommend graft rest due to hematoma.  Recommend continued dialysis through the tunneled dialysis catheter.  Can follow-up with his primary surgeons at [facility].    Fonda FORBES Rim MD [facility] Office: [phone]    DG CHEST PORT 1 VIEW Result Date: 01/26/2024 CLINICAL DATA:  141880 SOB (shortness of breath) 141880 EXAM: PORTABLE CHEST 1 VIEW COMPARISON:  Chest x-ray  12/26/2023 FINDINGS: Right chest wall dialysis catheter with tip overlying the right atrium. The heart and mediastinal contours are within normal limits. Right base atelectasis. No focal consolidation. No pulmonary edema. No pleural effusion. No pneumothorax. No acute osseous abnormality. IMPRESSION: No active disease. Electronically Signed   By: Morgane  Naveau M.D.   On: 01/26/2024 22:27   VAS US  DUPLEX DIALYSIS ACCESS (AVF, AVG) Result Date: 01/23/2024 DIALYSIS ACCESS Patient Name:  [name] [facility]  Date of Exam:   01/22/2024 Medical Rec #: [id number]        Accession #:    [id number] Date of Birth: 02/10/1986        Patient Gender: M Patient Age:   12 years Exam Location:  [facility] Procedure:      VAS US  DUPLEX DIALYSIS ACCESS (AVF, AVG) Referring Phys: MANUELITA BARTERS --------------------------------------------------------------------------------  Reason for Exam: Unable to dialyze through AVF/AVG. Access Site: Left Upper Extremity. Access Type: Upper arm straight graft. History: Mechanical complication AVG, eval fluid collection.          ESRD, hypertensive urgency. Comparison Study: No prior exam of the left upper extremity. Performing Technologist: Edilia Elden Appl  Examination Guidelines: A complete evaluation includes B-mode imaging, spectral Doppler, color Doppler, and power Doppler as needed of all accessible portions of each vessel. Unilateral testing is considered an integral part of a complete examination. Limited examinations for reoccurring indications may be performed as noted.  Findings: +--------------------+----------+-----------------+--------+ AVF                 PSV (cm/s)Flow Vol (mL/min)Comments +--------------------+----------+-----------------+--------+ Native artery inflow   157           75                 +--------------------+----------+-----------------+--------+   +--------------------+----------+-----------------+--------+ AVG                 PSV  (cm/s)Flow Vol (mL/min)Describe +--------------------+----------+-----------------+--------+ Native artery inflow   157           245                +--------------------+----------+-----------------+--------+ Arterial anastomosis   402                              +--------------------+----------+-----------------+--------+ Prox graft             300                              +--------------------+----------+-----------------+--------+ Mid graft              307                              +--------------------+----------+-----------------+--------+  Distal graft           512                              +--------------------+----------+-----------------+--------+ Venous anastomosis     510                              +--------------------+----------+-----------------+--------+ Venous outflow          66                              +--------------------+----------+-----------------+--------+ Complex structure note in the medial middle to distal upper arm measuring 5.4 x 2.7 x 3.6 cm.  Summary: Patent arteriovenous graft.  Complex structure noted in left upper arm.  *See table(s) above for measurements and observations.  Diagnosing physician: Fonda Rim Electronically signed by Fonda Rim on 01/23/2024 at 8:27:21 AM.   --------------------------------------------------------------------------------   Final    DG Pelvis 1-2 Views Result Date: 01/22/2024 EXAM: 1 or 2 VIEW(S) XRAY OF THE PELVIS 01/19/2024 02:23:59 PM COMPARISON: 07/16/2017 CLINICAL HISTORY: Limp on right w/ associated LE pain. 1 year of worsening Bilateral LE, Ankle, Foot pain in patient with ESRD, unclear if patient had traumatic injury. No involvement of hip or low back. Dx: Chronic pain of both ankles. FINDINGS: BONES AND JOINTS: No acute fracture. No focal osseous lesion. No joint dislocation. Mild bilateral and symmetric lateral spurring of the superior acetabulum. SOFT TISSUES: The soft tissues  are unremarkable. IMPRESSION: 1. No acute findings. 2. Mild bilateral and symmetric lateral spurring of the superior acetabulum. Electronically signed by: Waddell Calk MD 01/22/2024 04:42 PM EDT RP Workstation: [id number]   DG Knee 1-2 Views Right Result Date: 01/22/2024 EXAM: 1 or 2 VIEW(S) XRAY OF THE RIGHT KNEE 01/19/2024 02:23:59 PM COMPARISON: None available. CLINICAL HISTORY: 1 year of worsening Bilateral LE,Ankle.Foot pain in patient with ESRD, unclear if patient had traumatic injury. No involvement of hip or low back. FINDINGS: BONES AND JOINTS: No acute fracture. No focal osseous lesion. No joint dislocation. Small suprapatellar knee joint effusion. No significant degenerative changes. SOFT TISSUES: The soft tissues are unremarkable. IMPRESSION: 1. No acute osseous findings. 2. Small suprapatellar knee joint effusion. Electronically signed by: Waddell Calk MD 01/22/2024 04:40 PM EDT RP Workstation: [id number]   DG Knee 1-2 Views Left Result Date: 01/22/2024 EXAM: 1 or 2 VIEW(S) XRAY OF THE LEFT KNEE 01/19/2024 02:23:59 PM COMPARISON: None available. CLINICAL HISTORY: 1 year of worsening Bilateral LE,Ankle.Foot pain in patient with ESRD, unclear if patient had traumatic injury. No involvement of hip or low back. FINDINGS: BONES AND JOINTS: No acute fracture. No focal osseous lesion. No joint dislocation. No significant joint effusion. No significant degenerative changes. SOFT TISSUES: The soft tissues are unremarkable. IMPRESSION: 1. Normal exam. Electronically signed by: Waddell Calk MD 01/22/2024 04:39 PM EDT RP Workstation: [id number]   DG Ankle Complete Left Result Date: 01/22/2024 EXAM: 3 or more VIEW(S) XRAY OF THE LEFT ANKLE 01/19/2024 02:23:59 PM CLINICAL HISTORY: 1 year of worsening Bilateral LE,Ankle.Foot pain in patient with ESRD, unclear if patient had traumatic injury. No involvement of hip or low back. COMPARISON: None available. FINDINGS: BONES AND JOINTS: No acute fracture. No  focal osseous lesion. No joint dislocation. Plantar calcaneal spur. SOFT TISSUES: Soft tissue swelling about the ankle. Vascular calcifications. IMPRESSION: 1. No acute osseous findings. 2. Soft  tissue swelling about the ankle. 3. Plantar calcaneal spur. 4. Vascular calcifications. Electronically signed by: Waddell Calk MD 01/22/2024 04:39 PM EDT RP Workstation: [id number]   DG Ankle Complete Right Result Date: 01/22/2024 EXAM: 3 or more VIEW(S) XRAY OF THE RIGHT ANKLE 01/19/2024 02:23:59 PM CLINICAL HISTORY: 1 year of worsening Bilateral LE,Ankle.Foot pain in patient with ESRD, unclear if patient had traumatic injury. No involvement of hip or low back. COMPARISON: None available. FINDINGS: BONES AND JOINTS: No acute fracture. No focal osseous lesion. No joint dislocation. Small plantar calcaneal spur. SOFT TISSUES: Soft tissue edema. Vascular calcifications. IMPRESSION: 1. No acute osseous abnormality. 2. Small plantar calcaneal spur. 3. Soft tissue edema. Electronically signed by: Waddell Calk MD 01/22/2024 04:38 PM EDT RP Workstation: [id number]   DG Foot Complete Left Result Date: 01/22/2024 EXAM: 3 or more VIEW(S) XRAY OF THE LEFT FOOT 01/19/2024 02:23:59 PM COMPARISON: None available. CLINICAL HISTORY: 1 year of worsening Bilateral LE,Ankle.Foot pain in patient with ESRD, unclear if patient had traumatic injury. No involvement of hip or low back FINDINGS: BONES AND JOINTS: No acute fracture. No focal osseous lesion. No joint dislocation. Hallux valgus deformity. Small plantar calcaneal spur. SOFT TISSUES: The soft tissues are unremarkable. Vascular calcifications. IMPRESSION: 1. No acute findings. 2. Hallux valgus deformity and small plantar calcaneal spur. Electronically signed by: Waddell Calk MD 01/22/2024 03:34 PM EDT RP Workstation: [id number]   DG Foot Complete Right Result Date: 01/22/2024 EXAM: 3 or more VIEW(S) XRAY OF THE RIGHT FOOT 01/19/2024 02:23:59 PM COMPARISON: None available.  CLINICAL HISTORY: 1 year of worsening Bilateral LE,Ankle.Foot pain in patient with ESRD, unclear if patient had traumatic injury. No involvement of hip or low back. FINDINGS: BONES AND JOINTS: No acute fracture. No focal osseous lesion. No joint dislocation. Mild hallux valgus. Small plantar calcaneal spur. SOFT TISSUES: The soft tissues are unremarkable. Vascular calcifications. IMPRESSION: 1. No acute findings. 2. Mild hallux valgus and small plantar calcaneal spur. Electronically signed by: Waddell Calk MD 01/22/2024 03:30 PM EDT RP Workstation: [id number]   CT Head Wo Contrast Result Date: 01/20/2024 CLINICAL DATA:  Headache, dizziness, facial numbness, hypertension EXAM: CT HEAD WITHOUT CONTRAST TECHNIQUE: Contiguous axial images were obtained from the base of the skull through the vertex without intravenous contrast. RADIATION DOSE REDUCTION: This exam was performed according to the departmental dose-optimization program which includes automated exposure control, adjustment of the mA and/or kV according to patient size and/or use of iterative reconstruction technique. COMPARISON:  10/28/2023 FINDINGS: Brain: No acute infarct or hemorrhage. Lateral ventricles and midline structures are unremarkable. No acute extra-axial fluid collections. No mass effect. Vascular: No hyperdense vessel or unexpected calcification. Skull: Normal. Negative for fracture or focal lesion. Sinuses/Orbits: No acute finding. Other: None. IMPRESSION: 1. No acute intracranial process. Electronically Signed   By: Ozell Daring M.D.   On: 01/20/2024 21:36   (Echo, Carotid, EGD, Colonoscopy, ERCP)    Subjective:  Up and around in the room anxious to go home. Discharge Exam: Vitals:   02/10/24 1206 02/10/24 1215  BP: (!) 173/89 (!) 172/88  Pulse: 81 81  Resp: 12 13  Temp: 98.4 F (36.9 C)   SpO2: 100% 99%   Vitals:   02/10/24 1100 02/10/24 1130 02/10/24 1206 02/10/24 1215  BP: (!) 172/83 (!) 160/91 (!) 173/89 (!)  172/88  Pulse: 78 80 81 81  Resp: 12 12 12 13   Temp:   98.4 F (36.9 C)   TempSrc:   Oral   SpO2: 100% 99% 100% 99%  Weight:   72.1 kg   Height:        General: Pt is alert, awake, not in acute distress Cardiovascular: RRR, S1/S2 +, no rubs, no gallops Respiratory: CTA bilaterally, no wheezing, no rhonchi Abdominal: Soft, NT, ND, bowel sounds + Extremities: no edema, no cyanosis    The results of significant diagnostics from this hospitalization (including imaging, microbiology, ancillary and laboratory) are listed below for reference.     Microbiology: Recent Results (from the past 240 hours)  Culture, blood (Routine X 2) w Reflex to ID Panel     Status: Abnormal   Collection Time: 02/04/24  5:35 PM   Specimen: BLOOD RIGHT FOREARM  Result Value Ref Range Status   Specimen Description BLOOD RIGHT FOREARM  Final   Special Requests   Final    BOTTLES DRAWN AEROBIC AND ANAEROBIC Blood Culture adequate volume   Culture  Setup Time   Final    GRAM NEGATIVE RODS IN BOTH AEROBIC AND ANAEROBIC BOTTLES CRITICAL RESULT CALLED TO, READ BACK BY AND VERIFIED WITH: MAYA WENDI CLOSE [id number] AT 1220, ADC Performed at [facility], [address] [place], [place] [address]    Culture KLEBSIELLA OXYTOCA (A)  Final   Report Status 02/07/2024 FINAL  Final   Organism ID, Bacteria KLEBSIELLA OXYTOCA  Final      Susceptibility   Klebsiella oxytoca - MIC*    AMPICILLIN >=32 RESISTANT Resistant     CEFEPIME  <=0.12 SENSITIVE Sensitive     ERTAPENEM <=0.12 SENSITIVE Sensitive     CEFTRIAXONE  <=0.25 SENSITIVE Sensitive     CIPROFLOXACIN <=0.06 SENSITIVE Sensitive     GENTAMICIN <=1 SENSITIVE Sensitive     MEROPENEM <=0.25 SENSITIVE Sensitive     TRIMETH/SULFA <=20 SENSITIVE Sensitive     AMPICILLIN/SULBACTAM 16 INTERMEDIATE Intermediate     PIP/TAZO Value in next row Sensitive      <=4 SENSITIVEThis is a modified FDA-approved test that has been validated and its performance  characteristics determined by the reporting laboratory.  This laboratory is certified under the Clinical Laboratory Improvement Amendments CLIA as qualified to perform high complexity clinical laboratory testing.    * KLEBSIELLA OXYTOCA  Blood Culture ID Panel (Reflexed)     Status: Abnormal   Collection Time: 02/04/24  5:35 PM  Result Value Ref Range Status   Enterococcus faecalis NOT DETECTED NOT DETECTED Final   Enterococcus Faecium NOT DETECTED NOT DETECTED Final   Listeria monocytogenes NOT DETECTED NOT DETECTED Final   Staphylococcus species NOT DETECTED NOT DETECTED Final   Staphylococcus aureus (BCID) NOT DETECTED NOT DETECTED Final   Staphylococcus epidermidis NOT DETECTED NOT DETECTED Final   Staphylococcus lugdunensis NOT DETECTED NOT DETECTED Final   Streptococcus species NOT DETECTED NOT DETECTED Final   Streptococcus agalactiae NOT DETECTED NOT DETECTED Final   Streptococcus pneumoniae NOT DETECTED NOT DETECTED Final   Streptococcus pyogenes NOT DETECTED NOT DETECTED Final   A.calcoaceticus-baumannii NOT DETECTED NOT DETECTED Final   Bacteroides fragilis NOT DETECTED NOT DETECTED Final   Enterobacterales DETECTED (A) NOT DETECTED Final    Comment: Enterobacterales represent a large order of gram negative bacteria, not a single organism. CRITICAL RESULT CALLED TO, READ BACK BY AND VERIFIED WITH: PHARMD BSABRA CLOSE [id number] AT 1220, ADC    Enterobacter cloacae complex NOT DETECTED NOT DETECTED Final   Escherichia coli NOT DETECTED NOT DETECTED Final   Klebsiella aerogenes NOT DETECTED NOT DETECTED Final   Klebsiella oxytoca DETECTED (A) NOT DETECTED Final    Comment:  CRITICAL RESULT CALLED TO, READ BACK BY AND VERIFIED WITH: PHARMD BSABRA CLOSE [id number] AT 1220, ADC    Klebsiella pneumoniae NOT DETECTED NOT DETECTED Final   Proteus species NOT DETECTED NOT DETECTED Final   Salmonella species NOT DETECTED NOT DETECTED Final   Serratia marcescens NOT DETECTED NOT DETECTED Final    Haemophilus influenzae NOT DETECTED NOT DETECTED Final   Neisseria meningitidis NOT DETECTED NOT DETECTED Final   Pseudomonas aeruginosa NOT DETECTED NOT DETECTED Final   Stenotrophomonas maltophilia NOT DETECTED NOT DETECTED Final   Candida albicans NOT DETECTED NOT DETECTED Final   Candida auris NOT DETECTED NOT DETECTED Final   Candida glabrata NOT DETECTED NOT DETECTED Final   Candida krusei NOT DETECTED NOT DETECTED Final   Candida parapsilosis NOT DETECTED NOT DETECTED Final   Candida tropicalis NOT DETECTED NOT DETECTED Final   Cryptococcus neoformans/gattii NOT DETECTED NOT DETECTED Final   CTX-M ESBL NOT DETECTED NOT DETECTED Final   Carbapenem resistance IMP NOT DETECTED NOT DETECTED Final   Carbapenem resistance KPC NOT DETECTED NOT DETECTED Final   Carbapenem resistance NDM NOT DETECTED NOT DETECTED Final   Carbapenem resist OXA 48 LIKE NOT DETECTED NOT DETECTED Final   Carbapenem resistance VIM NOT DETECTED NOT DETECTED Final    Comment: Performed at [facility], [address] [place], [place] [address]  Culture, blood (Routine X 2) w Reflex to ID Panel     Status: Abnormal   Collection Time: 02/04/24  5:50 PM   Specimen: BLOOD RIGHT HAND  Result Value Ref Range Status   Specimen Description BLOOD RIGHT HAND  Final   Special Requests   Final    BOTTLES DRAWN AEROBIC AND ANAEROBIC Blood Culture adequate volume   Culture  Setup Time   Final    IN BOTH AEROBIC AND ANAEROBIC BOTTLES CRITICAL VALUE NOTED.  VALUE IS CONSISTENT WITH PREVIOUSLY REPORTED AND CALLED VALUE.    Culture (A)  Final    KLEBSIELLA OXYTOCA SUSCEPTIBILITIES PERFORMED ON PREVIOUS CULTURE WITHIN THE LAST 5 DAYS. Performed at [facility], [address] [place], [place] [address]    Report Status 02/07/2024 FINAL  Final  Culture, blood (Routine X 2) w Reflex to ID Panel     Status: None   Collection Time: 02/06/24  8:16 AM   Specimen: BLOOD RIGHT HAND  Result Value Ref Range Status    Specimen Description BLOOD RIGHT HAND  Final   Special Requests   Final    BOTTLES DRAWN AEROBIC AND ANAEROBIC Blood Culture results may not be optimal due to an inadequate volume of blood received in culture bottles   Culture   Final    NO GROWTH 5 DAYS Performed at [facility], [address], [place], [place] [address]    Report Status 02/11/2024 FINAL  Final  Culture, blood (Routine X 2) w Reflex to ID Panel     Status: None   Collection Time: 02/06/24  8:25 AM   Specimen: BLOOD RIGHT HAND  Result Value Ref Range Status   Specimen Description BLOOD RIGHT HAND  Final   Special Requests   Final    BOTTLES DRAWN AEROBIC AND ANAEROBIC Blood Culture results may not be optimal due to an inadequate volume of blood received in culture bottles   Culture   Final    NO GROWTH 5 DAYS Performed at [facility], [address] [place], [place] [address]    Report Status 02/11/2024 FINAL  Final     Labs: BNP (last 3 results) Recent Labs    05/11/23 0339 08/30/23 2153 01/20/24 2100  BNP 1,341.8* 268.4* 1,736.2*   Basic Metabolic Panel: Recent Labs  Lab 02/08/24 0754 02/10/24 0755  NA 132* 133*  K 4.5 4.2  CL 90* 90*  CO2 22 26  GLUCOSE 128* 101*  BUN 85* 55*  CREATININE 14.82* 13.20*  CALCIUM  9.0 9.2  PHOS 5.2* 4.4   Liver Function Tests: Recent Labs  Lab 02/08/24 0754 02/10/24 0755  ALBUMIN  3.1* 3.2*   No results for input(s): LIPASE, AMYLASE in the last 168 hours. No results for input(s): AMMONIA in the last 168 hours. CBC: Recent Labs  Lab 02/08/24 0754 02/10/24 0755  WBC 7.1 5.5  HGB 9.5* 9.8*  HCT 29.7* 30.9*  MCV 85.6 85.8  PLT 235 283   Cardiac Enzymes: No results for input(s): CKTOTAL, CKMB, CKMBINDEX, TROPONINI in the last 168 hours. BNP: Invalid input(s): POCBNP CBG: Recent Labs  Lab 02/09/24 1623 02/09/24 2201 02/10/24 0618 02/10/24 0951 02/10/24 1239  GLUCAP 118* 141* 74 128* 102*   D-Dimer No results  for input(s): DDIMER in the last 72 hours. Hgb A1c No results for input(s): HGBA1C in the last 72 hours. Lipid Profile No results for input(s): CHOL, HDL, LDLCALC, TRIG, CHOLHDL, LDLDIRECT in the last 72 hours. Thyroid function studies No results for input(s): TSH, T4TOTAL, T3FREE, THYROIDAB in the last 72 hours.  Invalid input(s): FREET3 Anemia work up No results for input(s): VITAMINB12, FOLATE, FERRITIN, TIBC, IRON , RETICCTPCT in the last 72 hours. Urinalysis    Component Value Date/Time   COLORURINE STRAW (A) 06/07/2021 1758   APPEARANCEUR CLEAR 06/07/2021 1758   LABSPEC 1.008 06/07/2021 1758   PHURINE 6.0 06/07/2021 1758   GLUCOSEU 50 (A) 06/07/2021 1758   HGBUR SMALL (A) 06/07/2021 1758   BILIRUBINUR NEGATIVE 06/07/2021 1758   KETONESUR NEGATIVE 06/07/2021 1758   PROTEINUR >=300 (A) 06/07/2021 1758   UROBILINOGEN 0.2 05/29/2013 1121   NITRITE NEGATIVE 06/07/2021 1758   LEUKOCYTESUR NEGATIVE 06/07/2021 1758   Sepsis Labs Recent Labs  Lab 02/08/24 0754 02/10/24 0755  WBC 7.1 5.5   Microbiology Recent Results (from the past 240 hours)  Culture, blood (Routine X 2) w Reflex to ID Panel     Status: Abnormal   Collection Time: 02/04/24  5:35 PM   Specimen: BLOOD RIGHT FOREARM  Result Value Ref Range Status   Specimen Description BLOOD RIGHT FOREARM  Final   Special Requests   Final    BOTTLES DRAWN AEROBIC AND ANAEROBIC Blood Culture adequate volume   Culture  Setup Time   Final    GRAM NEGATIVE RODS IN BOTH AEROBIC AND ANAEROBIC BOTTLES CRITICAL RESULT CALLED TO, READ BACK BY AND VERIFIED WITH: MAYA WENDI CLOSE [id number] AT 1220, ADC Performed at [facility], [address], [place], [place] [address]    Culture KLEBSIELLA OXYTOCA (A)  Final   Report Status 02/07/2024 FINAL  Final   Organism ID, Bacteria KLEBSIELLA OXYTOCA  Final      Susceptibility   Klebsiella oxytoca - MIC*    AMPICILLIN >=32 RESISTANT Resistant      CEFEPIME  <=0.12 SENSITIVE Sensitive     ERTAPENEM <=0.12 SENSITIVE Sensitive     CEFTRIAXONE  <=0.25 SENSITIVE Sensitive     CIPROFLOXACIN <=0.06 SENSITIVE Sensitive     GENTAMICIN <=1 SENSITIVE Sensitive     MEROPENEM <=0.25 SENSITIVE Sensitive     TRIMETH/SULFA <=20 SENSITIVE Sensitive     AMPICILLIN/SULBACTAM  16 INTERMEDIATE Intermediate     PIP/TAZO Value in next row Sensitive      <=4 SENSITIVEThis is a modified FDA-approved test that has been validated and its performance characteristics determined by the reporting laboratory.  This laboratory is certified under the Clinical Laboratory Improvement Amendments CLIA as qualified to perform high complexity clinical laboratory testing.    * KLEBSIELLA OXYTOCA  Blood Culture ID Panel (Reflexed)     Status: Abnormal   Collection Time: 02/04/24  5:35 PM  Result Value Ref Range Status   Enterococcus faecalis NOT DETECTED NOT DETECTED Final   Enterococcus Faecium NOT DETECTED NOT DETECTED Final   Listeria monocytogenes NOT DETECTED NOT DETECTED Final   Staphylococcus species NOT DETECTED NOT DETECTED Final   Staphylococcus aureus (BCID) NOT DETECTED NOT DETECTED Final   Staphylococcus epidermidis NOT DETECTED NOT DETECTED Final   Staphylococcus lugdunensis NOT DETECTED NOT DETECTED Final   Streptococcus species NOT DETECTED NOT DETECTED Final   Streptococcus agalactiae NOT DETECTED NOT DETECTED Final   Streptococcus pneumoniae NOT DETECTED NOT DETECTED Final   Streptococcus pyogenes NOT DETECTED NOT DETECTED Final   A.calcoaceticus-baumannii NOT DETECTED NOT DETECTED Final   Bacteroides fragilis NOT DETECTED NOT DETECTED Final   Enterobacterales DETECTED (A) NOT DETECTED Final    Comment: Enterobacterales represent a large order of gram negative bacteria, not a single organism. CRITICAL RESULT CALLED TO, READ BACK BY AND VERIFIED WITH: PHARMD BSABRA CLOSE [id number] AT 1220, ADC    Enterobacter cloacae complex NOT DETECTED NOT DETECTED Final    Escherichia coli NOT DETECTED NOT DETECTED Final   Klebsiella aerogenes NOT DETECTED NOT DETECTED Final   Klebsiella oxytoca DETECTED (A) NOT DETECTED Final    Comment: CRITICAL RESULT CALLED TO, READ BACK BY AND VERIFIED WITH: PHARMD BSABRA CLOSE [id number] AT 1220, ADC    Klebsiella pneumoniae NOT DETECTED NOT DETECTED Final   Proteus species NOT DETECTED NOT DETECTED Final   Salmonella species NOT DETECTED NOT DETECTED Final   Serratia marcescens NOT DETECTED NOT DETECTED Final   Haemophilus influenzae NOT DETECTED NOT DETECTED Final   Neisseria meningitidis NOT DETECTED NOT DETECTED Final   Pseudomonas aeruginosa NOT DETECTED NOT DETECTED Final   Stenotrophomonas maltophilia NOT DETECTED NOT DETECTED Final   Candida albicans NOT DETECTED NOT DETECTED Final   Candida auris NOT DETECTED NOT DETECTED Final   Candida glabrata NOT DETECTED NOT DETECTED Final   Candida krusei NOT DETECTED NOT DETECTED Final   Candida parapsilosis NOT DETECTED NOT DETECTED Final   Candida tropicalis NOT DETECTED NOT DETECTED Final   Cryptococcus neoformans/gattii NOT DETECTED NOT DETECTED Final   CTX-M ESBL NOT DETECTED NOT DETECTED Final   Carbapenem resistance IMP NOT DETECTED NOT DETECTED Final   Carbapenem resistance KPC NOT DETECTED NOT DETECTED Final   Carbapenem resistance NDM NOT DETECTED NOT DETECTED Final   Carbapenem resist OXA 48 LIKE NOT DETECTED NOT DETECTED Final   Carbapenem resistance VIM NOT DETECTED NOT DETECTED Final    Comment: Performed at [facility], [address], [place], [place] [address]  Culture, blood (Routine X 2) w Reflex to ID Panel     Status: Abnormal   Collection Time: 02/04/24  5:50 PM   Specimen: BLOOD RIGHT HAND  Result Value Ref Range Status   Specimen Description BLOOD RIGHT HAND  Final   Special Requests   Final    BOTTLES DRAWN AEROBIC AND ANAEROBIC Blood Culture adequate volume   Culture  Setup Time   Final  IN BOTH AEROBIC AND ANAEROBIC  BOTTLES CRITICAL VALUE NOTED.  VALUE IS CONSISTENT WITH PREVIOUSLY REPORTED AND CALLED VALUE.    Culture (A)  Final    KLEBSIELLA OXYTOCA SUSCEPTIBILITIES PERFORMED ON PREVIOUS CULTURE WITHIN THE LAST 5 DAYS. Performed at [facility], [address], [place], [place] [address]    Report Status 02/07/2024 FINAL  Final  Culture, blood (Routine X 2) w Reflex to ID Panel     Status: None   Collection Time: 02/06/24  8:16 AM   Specimen: BLOOD RIGHT HAND  Result Value Ref Range Status   Specimen Description BLOOD RIGHT HAND  Final   Special Requests   Final    BOTTLES DRAWN AEROBIC AND ANAEROBIC Blood Culture results may not be optimal due to an inadequate volume of blood received in culture bottles   Culture   Final    NO GROWTH 5 DAYS Performed at [facility], [address], [place], [place] [address]    Report Status 02/11/2024 FINAL  Final  Culture, blood (Routine X 2) w Reflex to ID Panel     Status: None   Collection Time: 02/06/24  8:25 AM   Specimen: BLOOD RIGHT HAND  Result Value Ref Range Status   Specimen Description BLOOD RIGHT HAND  Final   Special Requests   Final    BOTTLES DRAWN AEROBIC AND ANAEROBIC Blood Culture results may not be optimal due to an inadequate volume of blood received in culture bottles   Culture   Final    NO GROWTH 5 DAYS Performed at [facility], [address], [place], [place] [address]    Report Status 02/11/2024 FINAL  Final     Time coordinating discharge: 61 MIN SIGNED:   Almarie KANDICE Hoots, MD  [facility] 02/12/2024, 6:02 PM

## 2024-02-18 ENCOUNTER — Telehealth: Payer: Self-pay

## 2024-02-18 ENCOUNTER — Ambulatory Visit: Admitting: Family Medicine

## 2024-02-18 ENCOUNTER — Encounter: Payer: Self-pay | Admitting: Family Medicine

## 2024-02-18 VITALS — BP 210/100 | Wt 168.0 lb

## 2024-02-18 DIAGNOSIS — D171 Benign lipomatous neoplasm of skin and subcutaneous tissue of trunk: Secondary | ICD-10-CM | POA: Diagnosis not present

## 2024-02-18 DIAGNOSIS — M79672 Pain in left foot: Secondary | ICD-10-CM

## 2024-02-18 DIAGNOSIS — M8589 Other specified disorders of bone density and structure, multiple sites: Secondary | ICD-10-CM

## 2024-02-18 DIAGNOSIS — M79671 Pain in right foot: Secondary | ICD-10-CM

## 2024-02-18 DIAGNOSIS — I10 Essential (primary) hypertension: Secondary | ICD-10-CM | POA: Diagnosis not present

## 2024-02-18 NOTE — Progress Notes (Signed)
    SUBJECTIVE:   CHIEF COMPLAINT / HPI:   Lipoma Has had lipoma from over a year, was previously sent to general surgery but missed appointment due to being in hospital. No pain or decreased sensation. Would like to be referred back   Leg pan Hx of bilateral foot, knee and ankle pain. Obtained xrays as last visit. Is now using a cane to ambulate. Denies any traumatic injuries to leg. Does also endorse some decreased sensation and pins and needles.   Xrays showed some bone spurs, soft tissue edema and vascular calcifications.    PERTINENT  PMH / PSH:  ESRD on dialysis HTN  OBJECTIVE:   Wt 168 lb (76.2 kg)   BMI 25.54 kg/m   General: A&O, NAD HEENT: No sign of trauma Cardiac: RRR, no m/r/g Respiratory: CTAB, normal WOB, no w/c/r GI: Soft, NTTP, non-distended  Extremities: no TTP along bilateral feet, no LE edema noted  Skin: ~5cm x 4cm raised well circumscribed mass on R shoulder. Not TTP. Sensation intact.  Neuro: sensation intact throughout bilateral feet and legs  Psych: Appropriate mood and affect   ASSESSMENT/PLAN:   Assessment & Plan Other specified disorders of bone density and structure, multiple sites Bilateral foot pain  Given xray findings along with history of ESRD, reasonable to obtain DEXA scan and Calcium , PTH, Vit D, Alk phos to investigate bone density. Given calcifications seen on xray, will have patient see vascular surgery, may also have a component of PAD.  - consider addition of ASA or statin to assist with PAD as well as CVA prevention, will message Dr. Dolan to discuss    Lipoma of torso - referral sent to general surgery  Poorly-controlled hypertension  - hx of poorly controlled HTN. Followed by nephrology for this. Denies any headaches, vision changes, or motor or sensory loss.   Follow up in 1 month    Gloriann Ogren, MD [facility]

## 2024-02-18 NOTE — Patient Instructions (Addendum)
 It was wonderful to see you today.  Please bring ALL of your medications with you to every visit.   Today we talked about:  Lipoma: I placed a referral for [facility] to discuss removal  Bone Pain: Your ESRD puts you at increased risk of bone fractures. I ordered a scan to look at your bones and it will tell us  about your bone age.   I an also getting lab work today.   I also sent a referral to your vascular doctor. There is concerns for build up of calcium  in your blood vessels in your legs, which can lead to pain and numbness.   Thank you for choosing [facility].   Please call [phone] with any questions about today's appointment.  Please arrive at least 15 minutes prior to your scheduled appointments.   If you had blood work today, I will send you a MyChart message or a letter if results are normal. Otherwise, I will give you a call.   If you had a referral placed, they will call you to set up an appointment. Please give us  a call if you don't hear back in the next 2 weeks.   If you need additional refills before your next appointment, please call your pharmacy first.   Do you need your medications delivered to your home?   We'll send your prescription to the [facility] for delivery.          Address: [address] [place], [place], [place] [address]          Phone: [phone]  Please call the [facility] to speak with a pharmacist and set up your home medication delivery. If you have any questions, feel free to contact us  -- we're happy to help!  Other [facility] that offer affordable prices on both prescriptions and over-the-counter items, as well as convenient services like vaccinations, are  [facility], at [facility]         Address:  [address], [place], [place] [address]         Phone: [phone]  [facility], located in the [facility]        Address: [address], [place], [place] [address]        Phone: [phone]  [facility], at [facility]       Address: [address], [place], [place] 05/26/1985       Phone: [phone]  [facility], at [facility]       Address: [address] Floor, [place], [place] [address]       Phone: [phone]  You should follow up in our clinic in [place] in about 4 weeks (around 03/17/2024).  Gloriann Ogren, MD Family Medicine

## 2024-02-18 NOTE — Telephone Encounter (Signed)
 Dexa scan order sent to [facility] via fax along with insurance card. Cassell Mary CMA Address: [address] [place], [place], [place] [address] Phone: [phone]

## 2024-02-20 LAB — ALKALINE PHOSPHATASE, BONE SPECIFIC

## 2024-02-22 LAB — LIPID PANEL
Chol/HDL Ratio: 2 ratio (ref 0.0–5.0)
Cholesterol, Total: 118 mg/dL (ref 100–199)
HDL: 58 mg/dL (ref >39)
LDL Chol Calc (NIH): 48 mg/dL (ref 0–99)
Triglycerides: 52 mg/dL (ref 0–149)
VLDL Cholesterol Cal: 12 mg/dL (ref 5–40)

## 2024-02-22 LAB — PTH, INTACT AND CALCIUM
Calcium: 8.8 mg/dL (ref 8.7–10.2)
PTH: 445 pg/mL — ABNORMAL HIGH (ref 15–65)

## 2024-02-22 LAB — VITAMIN D 25 HYDROXY (VIT D DEFICIENCY, FRACTURES): Vit D, 25-Hydroxy: 17.8 ng/mL — ABNORMAL LOW (ref 30.0–100.0)

## 2024-02-22 LAB — ALKALINE PHOSPHATASE, BONE SPECIFIC: Tandem-R Ostase: 38.7 ug/L — ABNORMAL HIGH (ref 4.0–27.0)

## 2024-02-23 ENCOUNTER — Ambulatory Visit: Payer: Self-pay | Admitting: Family Medicine

## 2024-02-23 DIAGNOSIS — E559 Vitamin D deficiency, unspecified: Secondary | ICD-10-CM

## 2024-02-23 MED ORDER — CHOLECALCIFEROL 1.25 MG (50000 UT) PO TABS: 1.0000 | ORAL_TABLET | ORAL | 0 refills | Status: DC

## 2024-02-24 ENCOUNTER — Telehealth: Payer: Self-pay | Admitting: Family Medicine

## 2024-02-24 NOTE — Telephone Encounter (Signed)
 Received page to upper-level pager for after-hours call.  Returned patient's phone call at the number provided in after confirming name and date of birth asked the nature of the call.  Patient reported that he had missed a call from the office earlier today but was unable to reach the office to return our call.  After looking through the chart it appears the doctor below she tried to reach the patient regarding his recent lab results.  Patient is low on vitamin D and she ordered vitamin D supplementation.  Relayed this information to the patient who voiced understanding and agreement with the plan patient had no further questions at that time.  Lucie Pinal, DO PGY-2, Family Medicine

## 2024-03-07 NOTE — Therapy (Incomplete)
 OUTPATIENT PHYSICAL THERAPY NEURO EVALUATION   Patient Name: [name] MRN: [id number] DOB:06/09/1985, 38 y.o., male Today's Date: 03/07/2024   PCP: Lonnie Earnest, MD  REFERRING PROVIDER: Will Almarie MATSU, MD  END OF SESSION:   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   Heart murmur    Hypertension    Low blood sugar    Marijuana abuse    Noncompliance    Pericardial effusion 03/28/2023   Tobacco abuse    Past Surgical History:  Procedure Laterality Date   A/V FISTULAGRAM Right 10/27/2022   Procedure: A/V Fistulagram;  Surgeon: Eliza Lonni RAMAN, MD;  Location: [facility] INVASIVE CV LAB;  Service: Cardiovascular;  Laterality: Right;   A/V FISTULAGRAM Left 01/27/2024   Procedure: A/V Fistulagram;  Surgeon: Lanis Fonda BRAVO, MD;  Location: [facility] INVASIVE CV LAB;  Service: Cardiovascular;  Laterality: Left;   APPENDECTOMY     AV FISTULA PLACEMENT Left 03/03/2019   Procedure: ARTERIOVENOUS (AV) FISTULA CREATION LEFT ARM;  Surgeon: Sheree Penne Lonni, MD;  Location: [facility] OR;  Service: Vascular;  Laterality: Left;   BASCILIC VEIN TRANSPOSITION Right 04/12/2019   Procedure: RIGHT UPPER EXTREMITY BASCILIC VEIN TRANSPOSITION FIRST STAGE FISTULA;  Surgeon: Eliza Lonni RAMAN, MD;  Location: [facility] OR;  Service: Vascular;  Laterality: Right;   BIOPSY  10/14/2021   Procedure: BIOPSY;  Surgeon: Legrand Victory LITTIE DOUGLAS, MD;  Location: [facility] ENDOSCOPY;  Service: Gastroenterology;;   BIOPSY  06/06/2022   Procedure: BIOPSY;  Surgeon: Shila Gustav GAILS, MD;  Location: [facility] ENDOSCOPY;  Service: Gastroenterology;;   BIOPSY  11/12/2022   Procedure: BIOPSY;  Surgeon: Wilhelmenia Aloha Raddle., MD;  Location: [facility] ENDOSCOPY;  Service: Gastroenterology;;   BIOPSY  12/05/2022   Procedure: BIOPSY;  Surgeon: Stacia Glendia BRAVO, MD;  Location: [facility] ENDOSCOPY;  Service: Gastroenterology;;   COLONOSCOPY WITH PROPOFOL  N/A 10/14/2021   Procedure: COLONOSCOPY WITH PROPOFOL ;  Surgeon: Legrand Victory LITTIE DOUGLAS, MD;  Location: [facility] ENDOSCOPY;  Service: Gastroenterology;  Laterality: N/A;   DIALYSIS/PERMA CATHETER INSERTION N/A 04/03/2023   Procedure: DIALYSIS/PERMA CATHETER INSERTION;  Surgeon: Magda Debby SAILOR, MD;  Location: [facility] INVASIVE CV LAB;  Service: Cardiovascular;  Laterality: N/A;   ESOPHAGOGASTRODUODENOSCOPY N/A 11/12/2022   Procedure: ESOPHAGOGASTRODUODENOSCOPY (EGD);  Surgeon: Wilhelmenia Aloha Raddle., MD;  Location: [facility] ENDOSCOPY;  Service: Gastroenterology;  Laterality: N/A;   ESOPHAGOGASTRODUODENOSCOPY N/A 12/05/2022   Procedure: ESOPHAGOGASTRODUODENOSCOPY (EGD);  Surgeon: Stacia Glendia BRAVO, MD;  Location: [facility] ENDOSCOPY;  Service: Gastroenterology;  Laterality: N/A;   ESOPHAGOGASTRODUODENOSCOPY (EGD) WITH PROPOFOL  N/A 06/06/2022   Procedure: ESOPHAGOGASTRODUODENOSCOPY (EGD) WITH PROPOFOL ;  Surgeon: Shila Gustav GAILS, MD;  Location: [facility] ENDOSCOPY;  Service: Gastroenterology;  Laterality: N/A;   FISTULA SUPERFICIALIZATION Right 11/07/2022   Procedure: PLICATION OF LARGE ANEURYSMS OF RIGHT ARTERIOVENOUS FISTULA WITH INSERTION OF 7mm INTERPOSITIONAL GORTEX GRAFT;  Surgeon: Eliza Lonni RAMAN, MD;  Location: [facility] OR;  Service: Vascular;  Laterality: Right;   FLEXIBLE SIGMOIDOSCOPY N/A 04/14/2022   Procedure: FLEXIBLE SIGMOIDOSCOPY;  Surgeon: Shila Gustav GAILS, MD;  Location: [facility] ENDOSCOPY;  Service: Gastroenterology;  Laterality: N/A;   INSERTION OF DIALYSIS CATHETER  N/A 11/07/2022   Procedure: INSERTION OF RIGHT INTERNAL JUGULAR TUNNELED DIALYSIS CATHETER;  Surgeon: Eliza Lonni RAMAN, MD;  Location: [facility] OR;  Service: Vascular;  Laterality: N/A;   LIGATION OF ARTERIOVENOUS  FISTULA Left  03/06/2019   Procedure: LIGATION OF ARTERIOVENOUS  FISTULA;  Surgeon: Gretta Lonni PARAS, MD;  Location: [facility] OR;  Service: Vascular;  Laterality: Left;   PERIPHERAL VASCULAR BALLOON ANGIOPLASTY  10/27/2022   Procedure: PERIPHERAL VASCULAR BALLOON ANGIOPLASTY;  Surgeon: Eliza Lonni RAMAN, MD;  Location: [facility] INVASIVE CV LAB;  Service: Cardiovascular;;  rt upper arm fistula   SAVORY DILATION N/A 11/12/2022   Procedure: SAVORY DILATION;  Surgeon: Wilhelmenia Aloha Raddle., MD;  Location: [facility] ENDOSCOPY;  Service: Gastroenterology;  Laterality: N/A;   spinal tap     SPINE SURGERY     related to a spinal infection, unsure of surgery or infection source   TRANSESOPHAGEAL ECHOCARDIOGRAM (CATH LAB) N/A 04/02/2023   Procedure: TRANSESOPHAGEAL ECHOCARDIOGRAM;  Surgeon: Jeffrie Oneil BROCKS, MD;  Location: [facility] INVASIVE CV LAB;  Service: Cardiovascular;  Laterality: N/A;   WISDOM TOOTH EXTRACTION     Patient Active Problem List   Diagnosis Date Noted   COVID-19 virus detected 12/28/2023   Neurological abnormality 08/27/2023   Diarrhea 08/27/2023   Lipoma 07/22/2023   Acne vulgaris 07/22/2023   Anal itching 07/14/2023   Hypertensive crisis 05/08/2023   Moderate pericardial effusion 03/28/2023   GI bleed 12/04/2022   ESRD on dialysis (HCC) 12/04/2022   Cannabis use disorder, moderate, in controlled environment, dependence (HCC) 11/13/2022   Cannabis dependence with withdrawal (HCC) 11/13/2022   Anemia of chronic disease 11/12/2022   Esophageal dysphagia 11/11/2022   Abnormal barium swallow 11/11/2022   Chronic diastolic CHF (congestive heart failure) (HCC) 11/09/2022   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   Macrocytic anemia 11/08/2022   A-V fistula 11/07/2022   Aneurysm of arteriovenous dialysis fistula 11/07/2022   Gastritis and gastroduodenitis 06/06/2022   Migraines 06/05/2022   Rectal bleeding 04/14/2022   Heart failure with mildly reduced ejection fraction (HFmrEF) (HCC) 04/08/2022    Bilateral shoulder pain 04/08/2022   Acute otitis externa of both ears 04/08/2022   Housing insecurity 01/01/2022   Food insecurity 01/01/2022   Non-compliance with renal dialysis    Hearing loss 10/08/2021   Acute lower GI bleeding 06/10/2021   Family history of diabetes mellitus 02/11/2021   Hypoglycemic syndrome 02/11/2021   Rash and nonspecific skin eruption 12/16/2019   Poorly-controlled hypertension 06/07/2018   Alport syndrome 07/14/2017   GERD (gastroesophageal reflux disease) 07/14/2017   History of appendectomy 07/14/2017   Obesity (BMI 30.0-34.9) 07/14/2017   Pre-transplant evaluation for kidney transplant 07/14/2017   Secondary hyperparathyroidism 07/14/2017   Tobacco use 07/14/2017   Bipolar 1 disorder (HCC) 09/01/2013   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    ONSET DATE: ***  REFERRING DIAG: I10 (ICD-10-CM) - HTN (hypertension)  THERAPY DIAG:  No diagnosis found.  Rationale for Evaluation and Treatment: Rehabilitation  SUBJECTIVE:  SUBJECTIVE STATEMENT: *** Pt accompanied by: {accompnied:27141}  PERTINENT HISTORY: HTN HFpEF Alport syndrome ESRD on HD ( MWFS) GERD Anemia bipolar 1 hearing loss depression tobacco use  PAIN:  Are you having pain? {OPRCPAIN:27236}  PRECAUTIONS: {Therapy precautions:24002}  RED FLAGS: {PT Red Flags:29287}   WEIGHT BEARING RESTRICTIONS: {Yes ***/No:24003}  FALLS: Has patient fallen in last 6 months? {fallsyesno:27318}  LIVING ENVIRONMENT: Lives with: {OPRC lives with:25569::lives with their family} Lives in: {Lives in:25570} Stairs: {opstairs:27293} Has following equipment at home: {Assistive devices:23999}  PLOF: {PLOF:24004}  PATIENT GOALS: ***  OBJECTIVE:  Note: Objective measures were completed at Evaluation unless otherwise  noted.  DIAGNOSTIC FINDINGS: 01/20/24 head CT: No acute intracranial process.   COGNITION: Overall cognitive status: {cognition:24006}   SENSATION: {sensation:27233}  COORDINATION: Alternating pronation/supination: *** Alternating toe tap: *** Finger to nose: *** Heel to shin: ***    EDEMA:  {edema:24020}  MUSCLE TONE: {LE tone:25568}  POSTURE: {posture:25561}  LOWER EXTREMITY ROM:     Active  Right Eval Left Eval  Hip flexion    Hip extension    Hip abduction    Hip adduction    Hip internal rotation    Hip external rotation    Knee flexion    Knee extension    Ankle dorsiflexion    Ankle plantarflexion    Ankle inversion    Ankle eversion     (Blank rows = not tested)  LOWER EXTREMITY MMT:    MMT Right Eval Left Eval  Hip flexion    Hip extension    Hip abduction    Hip adduction    Hip internal rotation    Hip external rotation    Knee flexion    Knee extension    Ankle dorsiflexion    Ankle plantarflexion    Ankle inversion    Ankle eversion    (Blank rows = not tested)  GAIT: Findings: Assistive device utilized:{Assistive devices:23999}, Level of assistance: {Levels of assistance:24026}, and Comments: ***  FUNCTIONAL TESTS:  {Functional tests:24029}  PATIENT SURVEYS:  {rehab surveys:24030}                                                                                                                              TREATMENT DATE: ***    PATIENT EDUCATION: Education details: *** Person educated: {Person educated:25204} Education method: {Education Method:25205} Education comprehension: {Education Comprehension:25206}  HOME EXERCISE PROGRAM: ***  GOALS: Goals reviewed with patient? Yes  SHORT TERM GOALS: Target date: {follow up:25551}  Patient to be independent with initial HEP. Baseline: HEP initiated Goal status: {GOALSTATUS:25110}    LONG TERM GOALS: Target date: {follow up:25551}  Patient to be independent with  advanced HEP. Baseline: Not yet initiated  Goal status: {GOALSTATUS:25110}  Patient to demonstrate B LE strength >/=4+/5.  Baseline: See above Goal status: {GOALSTATUS:25110}  Patient to demonstrate *** ROM WFL and without pain limiting.  Baseline: *** Goal status: {GOALSTATUS:25110}  Patient to report and demonstrate improved head,  neck, and shoulder posture at rest and with activity.  Baseline: *** Goal status: {GOALSTATUS:25110}  Patient to demonstrate alternating reciprocal pattern when ascending and descending stairs with good stability and 1 handrail as needed.   Baseline: Unable Goal status: {GOALSTATUS:25110}  Patient to score at least 20/24 on DGI in order to decrease risk of falls.  Baseline: *** Goal status: {GOALSTATUS:25110}  Patient to complete TUG in <14 sec with LRAD in order to decrease risk of falls.   Baseline: *** Goal status: {GOALSTATUS:25110}  Patient to demonstrate 5xSTS test in <15 sec in order to decrease risk of falls.  Baseline: *** Goal status: {GOALSTATUS:25110}  Patient to score at least ***/56 on Berg in order to decrease risk of falls.  Baseline: *** Goal status: {GOALSTATUS:25110}  Patient to score at least *** on FOTO in order to indicate improved functional outcomes.  Baseline: *** Goal status: {GOALSTATUS:25110}  ASSESSMENT:  CLINICAL IMPRESSION:  Patient is a 38 y/o M presenting to [facility]PPT with c/o *** s/p hospitalization 01/20/24-02/10/24 for Hypertensive Crisis.   Patient today presenting with ***.    Patient was educated on gentle *** HEP and reported understanding. Prior to current episode, patient was independent. Would benefit from skilled PT services *** x/week for *** weeks to address aforementioned impairments in order to optimize level of function.    OBJECTIVE IMPAIRMENTS: {opptimpairments:25111}.   ACTIVITY LIMITATIONS: {activitylimitations:27494}  PARTICIPATION LIMITATIONS:  {participationrestrictions:25113}  PERSONAL FACTORS: {Personal factors:25162} are also affecting patient's functional outcome.   REHAB POTENTIAL: {rehabpotential:25112}  CLINICAL DECISION MAKING: {clinical decision making:25114}  EVALUATION COMPLEXITY: {Evaluation complexity:25115}  PLAN:  PT FREQUENCY: {rehab frequency:25116}  PT DURATION: {rehab duration:25117}  PLANNED INTERVENTIONS: {rehab planned interventions:25118::97110-Therapeutic exercises,97530- Therapeutic [phone]- Neuromuscular re-education,97535- Self Rjmz,02859- Manual therapy,Patient/Family education}  PLAN FOR NEXT SESSION: ***   Slater MARLA Christians, PT 03/07/2024, 11:12 AM

## 2024-03-08 ENCOUNTER — Ambulatory Visit: Admitting: Occupational Therapy

## 2024-03-08 ENCOUNTER — Ambulatory Visit: Admitting: Physical Therapy

## 2024-03-08 NOTE — Therapy (Incomplete)
 OUTPATIENT OCCUPATIONAL THERAPY NEURO EVALUATION  Patient Name: [name] MRN: [id number] DOB:07-24-1985, 38 y.o., male Today's Date: 03/08/2024  PCP: *** REFERRING PROVIDER: Will Almarie MATSU, MD (will need to send to PCP)  END OF SESSION:   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   Heart murmur    Hypertension    Low blood sugar    Marijuana abuse    Noncompliance    Pericardial effusion 03/28/2023   Tobacco abuse    Past Surgical History:  Procedure Laterality Date   A/V FISTULAGRAM Right 10/27/2022   Procedure: A/V Fistulagram;  Surgeon: Eliza Lonni RAMAN, MD;  Location: [facility] INVASIVE CV LAB;  Service: Cardiovascular;  Laterality: Right;   A/V FISTULAGRAM Left 01/27/2024   Procedure: A/V Fistulagram;  Surgeon: Lanis Fonda BRAVO, MD;  Location: [facility] INVASIVE CV LAB;  Service: Cardiovascular;  Laterality: Left;   APPENDECTOMY     AV FISTULA PLACEMENT Left 03/03/2019   Procedure: ARTERIOVENOUS (AV) FISTULA CREATION LEFT ARM;  Surgeon: Sheree Penne Lonni, MD;  Location: [facility] OR;  Service: Vascular;  Laterality: Left;   BASCILIC VEIN TRANSPOSITION Right 04/12/2019   Procedure: RIGHT UPPER EXTREMITY BASCILIC VEIN TRANSPOSITION FIRST STAGE FISTULA;  Surgeon: Eliza Lonni RAMAN, MD;  Location: [facility] OR;  Service: Vascular;  Laterality: Right;   BIOPSY  10/14/2021   Procedure: BIOPSY;  Surgeon: Legrand Victory LITTIE DOUGLAS, MD;  Location: [facility] ENDOSCOPY;  Service: Gastroenterology;;   BIOPSY  06/06/2022   Procedure: BIOPSY;  Surgeon: Shila Gustav GAILS, MD;  Location: [facility] ENDOSCOPY;  Service: Gastroenterology;;   BIOPSY  11/12/2022   Procedure: BIOPSY;  Surgeon:  Wilhelmenia Aloha Raddle., MD;  Location: [facility] ENDOSCOPY;  Service: Gastroenterology;;   BIOPSY  12/05/2022   Procedure: BIOPSY;  Surgeon: Stacia Glendia BRAVO, MD;  Location: [facility] ENDOSCOPY;  Service: Gastroenterology;;   COLONOSCOPY WITH PROPOFOL  N/A 10/14/2021   Procedure: COLONOSCOPY WITH PROPOFOL ;  Surgeon: Legrand Victory LITTIE DOUGLAS, MD;  Location: [facility] ENDOSCOPY;  Service: Gastroenterology;  Laterality: N/A;   DIALYSIS/PERMA CATHETER INSERTION N/A 04/03/2023   Procedure: DIALYSIS/PERMA CATHETER INSERTION;  Surgeon: Magda Debby SAILOR, MD;  Location: [facility] INVASIVE CV LAB;  Service: Cardiovascular;  Laterality: N/A;   ESOPHAGOGASTRODUODENOSCOPY N/A 11/12/2022   Procedure: ESOPHAGOGASTRODUODENOSCOPY (EGD);  Surgeon: Wilhelmenia Aloha Raddle., MD;  Location: [facility] ENDOSCOPY;  Service: Gastroenterology;  Laterality: N/A;   ESOPHAGOGASTRODUODENOSCOPY N/A 12/05/2022   Procedure: ESOPHAGOGASTRODUODENOSCOPY (EGD);  Surgeon: Stacia Glendia BRAVO, MD;  Location: [facility] ENDOSCOPY;  Service: Gastroenterology;  Laterality: N/A;   ESOPHAGOGASTRODUODENOSCOPY (EGD) WITH PROPOFOL  N/A 06/06/2022   Procedure: ESOPHAGOGASTRODUODENOSCOPY (EGD) WITH PROPOFOL ;  Surgeon: Shila Gustav GAILS, MD;  Location: [facility] ENDOSCOPY;  Service: Gastroenterology;  Laterality: N/A;   FISTULA SUPERFICIALIZATION Right 11/07/2022   Procedure: PLICATION OF LARGE ANEURYSMS OF RIGHT ARTERIOVENOUS FISTULA WITH INSERTION OF 7mm INTERPOSITIONAL GORTEX GRAFT;  Surgeon: Eliza Lonni RAMAN, MD;  Location: [facility] OR;  Service: Vascular;  Laterality: Right;   FLEXIBLE SIGMOIDOSCOPY N/A 04/14/2022   Procedure: FLEXIBLE SIGMOIDOSCOPY;  Surgeon: Shila Gustav GAILS, MD;  Location: [facility] ENDOSCOPY;  Service: Gastroenterology;  Laterality: N/A;   INSERTION OF DIALYSIS  CATHETER N/A 11/07/2022   Procedure: INSERTION OF RIGHT INTERNAL JUGULAR TUNNELED DIALYSIS CATHETER;  Surgeon: Eliza Lonni RAMAN, MD;  Location: [facility] OR;  Service: Vascular;  Laterality: N/A;   LIGATION OF ARTERIOVENOUS   FISTULA Left 03/06/2019   Procedure: LIGATION OF ARTERIOVENOUS  FISTULA;  Surgeon: Gretta Lonni PARAS, MD;  Location: [facility] OR;  Service: Vascular;  Laterality: Left;   PERIPHERAL VASCULAR BALLOON ANGIOPLASTY  10/27/2022   Procedure: PERIPHERAL VASCULAR BALLOON ANGIOPLASTY;  Surgeon: Eliza Lonni RAMAN, MD;  Location: [facility] INVASIVE CV LAB;  Service: Cardiovascular;;  rt upper arm fistula   SAVORY DILATION N/A 11/12/2022   Procedure: SAVORY DILATION;  Surgeon: Wilhelmenia Aloha Raddle., MD;  Location: [facility] ENDOSCOPY;  Service: Gastroenterology;  Laterality: N/A;   spinal tap     SPINE SURGERY     related to a spinal infection, unsure of surgery or infection source   TRANSESOPHAGEAL ECHOCARDIOGRAM (CATH LAB) N/A 04/02/2023   Procedure: TRANSESOPHAGEAL ECHOCARDIOGRAM;  Surgeon: Jeffrie Oneil BROCKS, MD;  Location: [facility] INVASIVE CV LAB;  Service: Cardiovascular;  Laterality: N/A;   WISDOM TOOTH EXTRACTION     Patient Active Problem List   Diagnosis Date Noted   COVID-19 virus detected 12/28/2023   Neurological abnormality 08/27/2023   Diarrhea 08/27/2023   Lipoma 07/22/2023   Acne vulgaris 07/22/2023   Anal itching 07/14/2023   Hypertensive crisis 05/08/2023   Moderate pericardial effusion 03/28/2023   GI bleed 12/04/2022   ESRD on dialysis (HCC) 12/04/2022   Cannabis use disorder, moderate, in controlled environment, dependence (HCC) 11/13/2022   Cannabis dependence with withdrawal (HCC) 11/13/2022   Anemia of chronic disease 11/12/2022   Esophageal dysphagia 11/11/2022   Abnormal barium swallow 11/11/2022   Chronic diastolic CHF (congestive heart failure) (HCC) 11/09/2022   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   Macrocytic anemia 11/08/2022   A-V fistula 11/07/2022   Aneurysm of arteriovenous dialysis fistula 11/07/2022   Gastritis and gastroduodenitis 06/06/2022   Migraines 06/05/2022   Rectal bleeding 04/14/2022   Heart failure with mildly reduced ejection fraction (HFmrEF) (HCC)  04/08/2022   Bilateral shoulder pain 04/08/2022   Acute otitis externa of both ears 04/08/2022   Housing insecurity 01/01/2022   Food insecurity 01/01/2022   Non-compliance with renal dialysis    Hearing loss 10/08/2021   Acute lower GI bleeding 06/10/2021   Family history of diabetes mellitus 02/11/2021   Hypoglycemic syndrome 02/11/2021   Rash and nonspecific skin eruption 12/16/2019   Poorly-controlled hypertension 06/07/2018   Alport syndrome 07/14/2017   GERD (gastroesophageal reflux disease) 07/14/2017   History of appendectomy 07/14/2017   Obesity (BMI 30.0-34.9) 07/14/2017   Pre-transplant evaluation for kidney transplant 07/14/2017   Secondary hyperparathyroidism 07/14/2017   Tobacco use 07/14/2017   Bipolar 1 disorder (HCC) 09/01/2013   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    ONSET DATE: ***  REFERRING DIAG: I10 (ICD-10-CM) - Essential (primary) hypertension  THERAPY DIAG:  No diagnosis found.  Rationale for Evaluation and Treatment: {HABREHAB:27488}  SUBJECTIVE:   SUBJECTIVE STATEMENT: *** Pt accompanied by: {accompnied:27141}  PERTINENT HISTORY: ***38 yo male presenting 9/10 with weakness, chills, body aches and general malaise facial numbness x2 weeks. Recent admission 8/16 - 8/18 COVID (+) with hypertension urgency PMH HTN HFpEF Alport syndrome ESRD on HD (MWFS) GERD Anemia bipolar 1 hearing loss depression tobacco use  PRECAUTIONS: {Therapy precautions:24002}  WEIGHT BEARING RESTRICTIONS: {Yes ***/No:24003}  PAIN:  Are you having pain? {OPRCPAIN:27236}  FALLS: Has patient fallen in last 6 months? {fallsyesno:27318}  LIVING ENVIRONMENT: Lives with: {OPRC  lives with:25569::lives with their family} Lives in: {Lives in:25570} Stairs: {opstairs:27293} Has following equipment at home: {Assistive devices:23999}  PLOF: {PLOF:24004}  PATIENT GOALS: ***  OBJECTIVE:  Note: Objective measures were completed at Evaluation unless otherwise noted.  HAND  DOMINANCE: {MISC; OT HAND DOMINANCE:[phone]}  ADLs: Overall ADLs: *** Transfers/ambulation related to ADLs: Eating: *** Grooming: *** UB Dressing: *** LB Dressing: *** Toileting: *** Bathing: *** Tub Shower transfers: *** Equipment: {equipment:25573}  IADLs: Shopping: *** Light housekeeping: *** Meal Prep: *** Community mobility: *** Medication management: *** Financial management: *** Handwriting: {OTWRITTENEXPRESSION:25361}  MOBILITY STATUS: {OTMOBILITY:25360}  POSTURE COMMENTS:  {posture:25561} Sitting balance: {sitting balance:25483}  ACTIVITY TOLERANCE: Activity tolerance: ***  FUNCTIONAL OUTCOME MEASURES: {OTFUNCTIONALMEASURES:27238}  UPPER EXTREMITY ROM:    {AROM/PROM:27142} ROM Right eval Left eval  Shoulder flexion    Shoulder abduction    Shoulder adduction    Shoulder extension    Shoulder internal rotation    Shoulder external rotation    Elbow flexion    Elbow extension    Wrist flexion    Wrist extension    Wrist ulnar deviation    Wrist radial deviation    Wrist pronation    Wrist supination    (Blank rows = not tested)  UPPER EXTREMITY MMT:     MMT Right eval Left eval  Shoulder flexion    Shoulder abduction    Shoulder adduction    Shoulder extension    Shoulder internal rotation    Shoulder external rotation    Middle trapezius    Lower trapezius    Elbow flexion    Elbow extension    Wrist flexion    Wrist extension    Wrist ulnar deviation    Wrist radial deviation    Wrist pronation    Wrist supination    (Blank rows = not tested)  HAND FUNCTION: {handfunction:27230}  COORDINATION: {otcoordination:27237}  SENSATION: {sensation:27233}  EDEMA: ***  MUSCLE TONE: {UETONE:25567}  COGNITION: Overall cognitive status: {cognition:24006}  VISION: Subjective report: *** Baseline vision: {OTBASELINEVISION:25363} Visual history: {OTVISUALHISTORY:25364}  VISION  ASSESSMENT: {visionassessment:27231}  Patient has difficulty with following activities due to following visual impairments: ***  PERCEPTION: {Perception:25564}  PRAXIS: {Praxis:25565}  OBSERVATIONS: ***                                                                                                                             TREATMENT DATE: ***         PATIENT EDUCATION: Education details: *** Person educated: {Person educated:25204} Education method: {Education Method:25205} Education comprehension: {Education Comprehension:25206}  HOME EXERCISE PROGRAM: ***   GOALS: Goals reviewed with patient? {yes/no:20286}  SHORT TERM GOALS: Target date: ***  *** Baseline: Goal status: INITIAL  2.  *** Baseline:  Goal status: INITIAL  3.  *** Baseline:  Goal status: INITIAL  4.  *** Baseline:  Goal status: INITIAL  5.  *** Baseline:  Goal status: INITIAL  6.  *** Baseline:  Goal status: INITIAL  LONG TERM GOALS: Target date: ***  *** Baseline:  Goal status: INITIAL  2.  *** Baseline:  Goal status: INITIAL  3.  *** Baseline:  Goal status: INITIAL  4.  *** Baseline:  Goal status: INITIAL  5.  *** Baseline:  Goal status: INITIAL  6.  *** Baseline:  Goal status: INITIAL  ASSESSMENT:  CLINICAL IMPRESSION: Patient is a *** y.o. *** who was seen today for occupational therapy evaluation for ***.   PERFORMANCE DEFICITS: in functional skills including {OT physical skills:25468}, cognitive skills including {OT cognitive skills:25469}, and psychosocial skills including {OT psychosocial skills:25470}.   IMPAIRMENTS: are limiting patient from {OT performance deficits:25471}.   CO-MORBIDITIES: {Comorbidities:25485} that affects occupational performance. Patient will benefit from skilled OT to address above impairments and improve overall function.  MODIFICATION OR ASSISTANCE TO COMPLETE EVALUATION: {OT modification:25474}  OT OCCUPATIONAL  PROFILE AND HISTORY: {OT PROFILE AND HISTORY:25484}  CLINICAL DECISION MAKING: {OT CDM:25475}  REHAB POTENTIAL: {rehabpotential:25112}  EVALUATION COMPLEXITY: {Evaluation complexity:25115}    PLAN:  OT FREQUENCY: {rehab frequency:25116}  OT DURATION: {rehab duration:25117}  PLANNED INTERVENTIONS: {OT Interventions:25467}  RECOMMENDED OTHER SERVICES: ***  CONSULTED AND AGREED WITH PLAN OF CARE: {ENR:74513}  PLAN FOR NEXT SESSION: PIERRETTE KAYLENE DOMINO, OTR/L 03/08/2024, 11:02 AM  [facility] at [facility] [address] [place], [place] 09/01/1985 Phone # [phone] Fax # [phone]

## 2024-03-10 ENCOUNTER — Other Ambulatory Visit: Payer: Self-pay

## 2024-03-10 DIAGNOSIS — I739 Peripheral vascular disease, unspecified: Secondary | ICD-10-CM

## 2024-03-17 ENCOUNTER — Encounter: Payer: Self-pay | Admitting: Family Medicine

## 2024-03-18 ENCOUNTER — Inpatient Hospital Stay (HOSPITAL_COMMUNITY)
Admission: EM | Admit: 2024-03-18 | Discharge: 2024-03-24 | DRG: 853 | Disposition: A | Discharge status: Home / Self Care | Attending: Family Medicine | Admitting: Family Medicine

## 2024-03-18 ENCOUNTER — Ambulatory Visit: Admitting: Family Medicine

## 2024-03-18 ENCOUNTER — Encounter (HOSPITAL_COMMUNITY): Payer: Self-pay

## 2024-03-18 ENCOUNTER — Other Ambulatory Visit: Payer: Self-pay

## 2024-03-18 DIAGNOSIS — R2231 Localized swelling, mass and lump, right upper limb: Secondary | ICD-10-CM | POA: Insufficient documentation

## 2024-03-18 DIAGNOSIS — I1A0 Resistant hypertension: Secondary | ICD-10-CM | POA: Diagnosis present

## 2024-03-18 DIAGNOSIS — R7881 Bacteremia: Principal | ICD-10-CM | POA: Diagnosis present

## 2024-03-18 DIAGNOSIS — R197 Diarrhea, unspecified: Secondary | ICD-10-CM | POA: Diagnosis not present

## 2024-03-18 DIAGNOSIS — N189 Chronic kidney disease, unspecified: Secondary | ICD-10-CM | POA: Diagnosis present

## 2024-03-18 DIAGNOSIS — N186 End stage renal disease: Secondary | ICD-10-CM | POA: Diagnosis present

## 2024-03-18 DIAGNOSIS — R531 Weakness: Secondary | ICD-10-CM

## 2024-03-18 DIAGNOSIS — A0472 Enterocolitis due to Clostridium difficile, not specified as recurrent: Secondary | ICD-10-CM | POA: Diagnosis present

## 2024-03-18 DIAGNOSIS — F17210 Nicotine dependence, cigarettes, uncomplicated: Secondary | ICD-10-CM | POA: Diagnosis present

## 2024-03-18 DIAGNOSIS — Z5941 Food insecurity: Secondary | ICD-10-CM

## 2024-03-18 DIAGNOSIS — Z789 Other specified health status: Secondary | ICD-10-CM

## 2024-03-18 DIAGNOSIS — N2581 Secondary hyperparathyroidism of renal origin: Secondary | ICD-10-CM | POA: Diagnosis present

## 2024-03-18 DIAGNOSIS — I132 Hypertensive heart and chronic kidney disease with heart failure and with stage 5 chronic kidney disease, or end stage renal disease: Secondary | ICD-10-CM | POA: Diagnosis present

## 2024-03-18 DIAGNOSIS — Z833 Family history of diabetes mellitus: Secondary | ICD-10-CM

## 2024-03-18 DIAGNOSIS — E875 Hyperkalemia: Secondary | ICD-10-CM | POA: Diagnosis present

## 2024-03-18 DIAGNOSIS — Z59868 Other specified financial insecurity: Secondary | ICD-10-CM

## 2024-03-18 DIAGNOSIS — K219 Gastro-esophageal reflux disease without esophagitis: Secondary | ICD-10-CM | POA: Diagnosis present

## 2024-03-18 DIAGNOSIS — Q8781 Alport syndrome: Secondary | ICD-10-CM

## 2024-03-18 DIAGNOSIS — I38 Endocarditis, valve unspecified: Secondary | ICD-10-CM | POA: Diagnosis not present

## 2024-03-18 DIAGNOSIS — D649 Anemia, unspecified: Secondary | ICD-10-CM | POA: Diagnosis not present

## 2024-03-18 DIAGNOSIS — I5032 Chronic diastolic (congestive) heart failure: Secondary | ICD-10-CM | POA: Diagnosis present

## 2024-03-18 DIAGNOSIS — Z8249 Family history of ischemic heart disease and other diseases of the circulatory system: Secondary | ICD-10-CM | POA: Diagnosis not present

## 2024-03-18 DIAGNOSIS — Z888 Allergy status to other drugs, medicaments and biological substances status: Secondary | ICD-10-CM | POA: Diagnosis not present

## 2024-03-18 DIAGNOSIS — B961 Klebsiella pneumoniae [K. pneumoniae] as the cause of diseases classified elsewhere: Secondary | ICD-10-CM | POA: Diagnosis present

## 2024-03-18 DIAGNOSIS — Z992 Dependence on renal dialysis: Secondary | ICD-10-CM | POA: Diagnosis not present

## 2024-03-18 DIAGNOSIS — Z79899 Other long term (current) drug therapy: Secondary | ICD-10-CM

## 2024-03-18 DIAGNOSIS — H903 Sensorineural hearing loss, bilateral: Secondary | ICD-10-CM | POA: Diagnosis present

## 2024-03-18 DIAGNOSIS — D631 Anemia in chronic kidney disease: Secondary | ICD-10-CM | POA: Diagnosis present

## 2024-03-18 DIAGNOSIS — F319 Bipolar disorder, unspecified: Secondary | ICD-10-CM | POA: Diagnosis present

## 2024-03-18 DIAGNOSIS — I361 Nonrheumatic tricuspid (valve) insufficiency: Secondary | ICD-10-CM | POA: Diagnosis not present

## 2024-03-18 LAB — CBC
HCT: 25.7 % — ABNORMAL LOW (ref 39.0–52.0)
HCT: 22.6 % — ABNORMAL LOW (ref 39.0–52.0)
Hemoglobin: 7.8 g/dL — ABNORMAL LOW (ref 13.0–17.0)
Hemoglobin: 7.2 g/dL — ABNORMAL LOW (ref 13.0–17.0)
MCH: 25.3 pg — ABNORMAL LOW (ref 26.0–34.0)
MCH: 26.2 pg (ref 26.0–34.0)
MCHC: 30.4 g/dL (ref 30.0–36.0)
MCHC: 31.9 g/dL (ref 30.0–36.0)
MCV: 83.4 fL (ref 80.0–100.0)
MCV: 82.2 fL (ref 80.0–100.0)
Platelets: 284 K/uL (ref 150–400)
Platelets: 297 K/uL (ref 150–400)
RBC: 3.08 MIL/uL — ABNORMAL LOW (ref 4.22–5.81)
RBC: 2.75 MIL/uL — ABNORMAL LOW (ref 4.22–5.81)
RDW: 18.1 % — ABNORMAL HIGH (ref 11.5–15.5)
RDW: 18.3 % — ABNORMAL HIGH (ref 11.5–15.5)
WBC: 7.6 K/uL (ref 4.0–10.5)
WBC: 9.7 K/uL (ref 4.0–10.5)
nRBC: 0 % (ref 0.0–0.2)
nRBC: 0 % (ref 0.0–0.2)

## 2024-03-18 LAB — COMPREHENSIVE METABOLIC PANEL WITH GFR
ALT: 20 U/L (ref 0–44)
AST: 21 U/L (ref 15–41)
Albumin: 3.1 g/dL — ABNORMAL LOW (ref 3.5–5.0)
Alkaline Phosphatase: 164 U/L — ABNORMAL HIGH (ref 38–126)
Anion gap: 20 — ABNORMAL HIGH (ref 5–15)
BUN: 66 mg/dL — ABNORMAL HIGH (ref 6–20)
CO2: 21 mmol/L — ABNORMAL LOW (ref 22–32)
Calcium: 8.7 mg/dL — ABNORMAL LOW (ref 8.9–10.3)
Chloride: 95 mmol/L — ABNORMAL LOW (ref 98–111)
Creatinine, Ser: 16.21 mg/dL — ABNORMAL HIGH (ref 0.61–1.24)
GFR, Estimated: 4 mL/min — ABNORMAL LOW (ref >60)
Glucose, Bld: 94 mg/dL (ref 70–99)
Potassium: 4.1 mmol/L (ref 3.5–5.1)
Sodium: 136 mmol/L (ref 135–145)
Total Bilirubin: 0.7 mg/dL (ref 0.0–1.2)
Total Protein: 7.9 g/dL (ref 6.5–8.1)

## 2024-03-18 LAB — LIPASE, BLOOD: Lipase: 32 U/L (ref 11–51)

## 2024-03-18 LAB — CREATININE, SERUM
Creatinine, Ser: 10.32 mg/dL — ABNORMAL HIGH (ref 0.61–1.24)
GFR, Estimated: 6 mL/min — ABNORMAL LOW (ref >60)

## 2024-03-18 LAB — HEPATITIS B SURFACE ANTIGEN: Hepatitis B Surface Ag: NONREACTIVE

## 2024-03-18 MED ORDER — SEVELAMER CARBONATE 800 MG PO TABS
1600.0000 mg | ORAL_TABLET | Freq: Three times a day (TID) | ORAL | Status: DC
  Administered 2024-03-18 – 2024-03-21 (×8): 1600 mg via ORAL
  Filled 2024-03-18 (×9): qty 2

## 2024-03-18 MED ORDER — DIPHENHYDRAMINE HCL 25 MG PO CAPS
ORAL_CAPSULE | ORAL | Status: AC
Start: 2024-03-18 — End: 2024-03-18
  Filled 2024-03-18: qty 1

## 2024-03-18 MED ORDER — ACETAMINOPHEN 500 MG PO TABS
1000.0000 mg | ORAL_TABLET | Freq: Four times a day (QID) | ORAL | Status: DC | PRN
  Administered 2024-03-19 – 2024-03-23 (×7): 1000 mg via ORAL
  Filled 2024-03-18 (×6): qty 2

## 2024-03-18 MED ORDER — ISOSORBIDE DINITRATE 20 MG PO TABS
20.0000 mg | ORAL_TABLET | Freq: Two times a day (BID) | ORAL | Status: DC
  Administered 2024-03-19: 20 mg via ORAL
  Filled 2024-03-18 (×3): qty 1

## 2024-03-18 MED ORDER — HEPARIN SODIUM (PORCINE) 5000 UNIT/ML IJ SOLN
5000.0000 [IU] | Freq: Three times a day (TID) | INTRAMUSCULAR | Status: DC
  Administered 2024-03-18 – 2024-03-24 (×8): 5000 [IU] via SUBCUTANEOUS
  Filled 2024-03-18 (×10): qty 1

## 2024-03-18 MED ORDER — LOSARTAN POTASSIUM 50 MG PO TABS
100.0000 mg | ORAL_TABLET | Freq: Every day | ORAL | Status: DC
  Administered 2024-03-18 – 2024-03-24 (×7): 100 mg via ORAL
  Filled 2024-03-18 (×7): qty 2

## 2024-03-18 MED ORDER — DIVALPROEX SODIUM ER 500 MG PO TB24
500.0000 mg | ORAL_TABLET | Freq: Every day | ORAL | Status: DC
  Administered 2024-03-18 – 2024-03-24 (×7): 500 mg via ORAL
  Filled 2024-03-18 (×7): qty 1

## 2024-03-18 MED ORDER — CALCITRIOL 0.5 MCG PO CAPS
1.0000 ug | ORAL_CAPSULE | ORAL | Status: DC
  Administered 2024-03-21 – 2024-03-23 (×2): 1 ug via ORAL
  Filled 2024-03-18 (×2): qty 2

## 2024-03-18 MED ORDER — CARVEDILOL 25 MG PO TABS
25.0000 mg | ORAL_TABLET | Freq: Two times a day (BID) | ORAL | Status: DC
  Administered 2024-03-18 – 2024-03-24 (×13): 25 mg via ORAL
  Filled 2024-03-18 (×2): qty 1
  Filled 2024-03-18: qty 2
  Filled 2024-03-18 (×10): qty 1

## 2024-03-18 MED ORDER — ACETAMINOPHEN 500 MG PO TABS
500.0000 mg | ORAL_TABLET | Freq: Once | ORAL | Status: AC
  Administered 2024-03-18: 500 mg via ORAL
  Filled 2024-03-18: qty 1

## 2024-03-18 MED ORDER — CHLORHEXIDINE GLUCONATE CLOTH 2 % EX PADS
6.0000 | MEDICATED_PAD | Freq: Every day | CUTANEOUS | Status: DC
  Administered 2024-03-19 – 2024-03-23 (×4): 6 via TOPICAL

## 2024-03-18 MED ORDER — OXYCODONE HCL 5 MG PO TABS
5.0000 mg | ORAL_TABLET | Freq: Once | ORAL | Status: AC
  Administered 2024-03-18: 5 mg via ORAL
  Filled 2024-03-18: qty 1

## 2024-03-18 MED ORDER — SPIRONOLACTONE 25 MG PO TABS
100.0000 mg | ORAL_TABLET | Freq: Every day | ORAL | Status: DC
  Administered 2024-03-18 – 2024-03-22 (×5): 100 mg via ORAL
  Filled 2024-03-18 (×4): qty 4
  Filled 2024-03-18: qty 1

## 2024-03-18 MED ORDER — DIPHENHYDRAMINE HCL 25 MG PO CAPS
25.0000 mg | ORAL_CAPSULE | Freq: Once | ORAL | Status: AC
  Administered 2024-03-18: 25 mg via ORAL

## 2024-03-18 MED ORDER — ISOSORBIDE MONONITRATE 10 MG PO TABS
20.0000 mg | ORAL_TABLET | Freq: Two times a day (BID) | ORAL | Status: DC
  Filled 2024-03-18: qty 1
  Filled 2024-03-18: qty 2

## 2024-03-18 MED ORDER — CHOLECALCIFEROL 1.25 MG (50000 UT) PO TABS: 1.0000 | ORAL_TABLET | ORAL | Status: DC

## 2024-03-18 MED ORDER — PANTOPRAZOLE SODIUM 40 MG PO TBEC
40.0000 mg | DELAYED_RELEASE_TABLET | Freq: Every day | ORAL | Status: DC
  Administered 2024-03-18 – 2024-03-24 (×7): 40 mg via ORAL
  Filled 2024-03-18 (×7): qty 1

## 2024-03-18 MED ORDER — SODIUM CHLORIDE 0.9 % IV SOLN
1.0000 g | INTRAVENOUS | Status: DC
  Administered 2024-03-18 – 2024-03-24 (×7): 1 g via INTRAVENOUS
  Filled 2024-03-18 (×12): qty 10

## 2024-03-18 MED ORDER — ACETAMINOPHEN 325 MG PO TABS
ORAL_TABLET | ORAL | Status: AC
  Filled 2024-03-18: qty 2

## 2024-03-18 MED ORDER — NIFEDIPINE ER OSMOTIC RELEASE 60 MG PO TB24
90.0000 mg | ORAL_TABLET | Freq: Two times a day (BID) | ORAL | Status: DC
  Administered 2024-03-18 – 2024-03-24 (×13): 90 mg via ORAL
  Filled 2024-03-18 (×15): qty 1

## 2024-03-18 MED ORDER — MINOXIDIL 2.5 MG PO TABS
2.5000 mg | ORAL_TABLET | Freq: Two times a day (BID) | ORAL | Status: DC
  Administered 2024-03-18 – 2024-03-24 (×13): 2.5 mg via ORAL
  Filled 2024-03-18 (×15): qty 1

## 2024-03-18 MED ORDER — ACETAMINOPHEN 500 MG PO TABS: 500.0000 mg | ORAL_TABLET | Freq: Four times a day (QID) | ORAL | Status: DC | PRN

## 2024-03-18 MED ORDER — CINACALCET HCL 30 MG PO TABS
30.0000 mg | ORAL_TABLET | ORAL | Status: DC
  Administered 2024-03-18 – 2024-03-23 (×3): 30 mg via ORAL
  Filled 2024-03-18 (×3): qty 1

## 2024-03-18 NOTE — Assessment & Plan Note (Addendum)
-   Admit to FMTS, attending Dr. Anders - Med-Surg, Vital signs per floor - Renal diet with 1200 mL restriction - Heparin  for VTE ppx - Repeat blood culture x 2 - Start cefepime  2 g MWF with HD per pharmacy - AM RFP and CBC - Nephology to consult IR vs VVS to determine next steps with HD catheter

## 2024-03-18 NOTE — Progress Notes (Signed)
 Pharmacy Antibiotic Note  [name] is a 38 y.o. male admitted on 03/18/2024 with bacteremia.  Pharmacy has been consulted for cefepime  dosing.  Blood cultures from 11/3 positive again with klebsiella oxytoca fro aerobic and anaerobic bottles. Culture report uploaded under media tab.  Plan: Resume cefepime  1g IV Q24h once blood cultures drawn Change to Cefepime  2g MWF with HD on discharge Trend WBC, fever, HD plans F/u cultures, clinical progress De-escalate when able  Height: 5' 8 (172.7 cm) Weight: 76 kg (167 lb 8.8 oz) IBW/kg (Calculated) : 68.4  Temp (24hrs), Avg:97.8 F (36.6 C), Min:97.6 F (36.4 C), Max:97.9 F (36.6 C)  Recent Labs  Lab 03/18/24 0210  WBC 7.6  CREATININE 16.21*    Estimated Creatinine Clearance: 6.1 mL/min (A) (by C-G formula based on SCr of 16.21 mg/dL (H)).    Allergies  Allergen Reactions   Nsaids Other (See Comments)    Contraindication due to ckd    Zestril [Lisinopril] Swelling   Risperdal  [Risperidone ] Other (See Comments)    Unknown reaction     Microbiology results: 9/25 BCx: klebsiella oxytoca  11/6 Bcx: not seen in Epic, faxing from HD center 11/7 Bcx: in process  Thank you for allowing pharmacy to be a part of this patient's care.  Shelba Collier, PharmD, BCPS Clinical Pharmacist

## 2024-03-18 NOTE — ED Triage Notes (Signed)
 Pt reports that he has not missed dialysis . Pt is a Monday Wednesday Friday dialysis pt

## 2024-03-18 NOTE — Assessment & Plan Note (Addendum)
 Alport syndrome: Bilateral sensorineural hearing loss Anemia of Chronic Disease: Hgb 7.8, will continue to monitor for signs of bleeding and with CBC in AM Bipolar 1 disorder: Continue Depakote  500 mg daily Migraines: Continue Depakote  500 mg daily GERD: Continue Protonix  40 mg daily HFpEF: EF 60 to 65%, no RVMA, mildly dilated pulmonary artery with normal PAP

## 2024-03-18 NOTE — ED Triage Notes (Signed)
 Pt coming in due to a positive blood culture from dialysis., pt reporting diarreah today. Pt reports that at dialysis they told him that he will need his catheter removed.

## 2024-03-18 NOTE — ED Provider Notes (Signed)
 [place] EMERGENCY DEPARTMENT AT [facility] Provider Note   CSN: [id number] Arrival date & time: 03/18/24  0118     Patient presents with: Diarrhea and abnormal labs   [name] is a 38 y.o. male.   The history is provided by the patient.  Diarrhea  He has history of hypertension, bipolar disorder, heart failure, end-stage renal disease on hemodialysis and was called by his nephrologist to come in because a blood culture was positive.  He has been on antibiotics which he received following his last dialysis on 11/5.  He denies fever but does endorse chills and sweats.  He was told that he would need to have his tunneled catheter removed.  He has an AV fistula on the left upper arm which is not able to be used because of clots.    Prior to Admission medications   Medication Sig Start Date End Date Taking? Authorizing Provider  acetaminophen  (TYLENOL ) 500 MG tablet Take 1 tablet (500 mg total) by mouth every 6 (six) hours as needed for mild pain (pain score 1-3) or fever. 02/10/24   Will Almarie MATSU, MD  carvedilol  (COREG ) 25 MG tablet Take 1 tablet (25 mg total) by mouth 2 (two) times daily with a meal. 02/10/24   Will Almarie MATSU, MD  Cholecalciferol 1.25 MG (50000 UT) TABS Take 1 tablet by mouth once a week. For 8 weeks 02/23/24   Baloch, Mahnoor, MD  cinacalcet  (SENSIPAR ) 30 MG tablet Take 1 tablet (30 mg total) by mouth every Monday, Wednesday, and Friday at 6 PM. 02/10/24   Will Almarie MATSU, MD  clotrimazole  (LOTRIMIN ) 1 % cream Apply topically 2 (two) times daily. 02/10/24   Will Almarie MATSU, MD  divalproex  (DEPAKOTE  ER) 500 MG 24 hr tablet Take 1 tablet (500 mg total) by mouth daily. 04/04/23   Joshua Domino, DO  isosorbide  mononitrate (ISMO ) 20 MG tablet Take 1 tablet (20 mg total) by mouth 2 (two) times daily. 02/10/24   Will Almarie MATSU, MD  losartan  (COZAAR ) 100 MG tablet Take 100 mg by mouth daily. 11/11/23   [provider]  minoxidil   (LONITEN ) 2.5 MG tablet Take 1 tablet (2.5 mg total) by mouth 2 (two) times daily. 02/10/24   Will Almarie MATSU, MD  NIFEdipine  (ADALAT  CC) 90 MG 24 hr tablet Take 90 mg by mouth in the morning and at bedtime.    [provider]  pantoprazole  (PROTONIX ) 40 MG tablet Take 1 tablet (40 mg total) by mouth daily. 12/28/23   Babcock, Peter E, NP  sevelamer  carbonate (RENVELA ) 800 MG tablet Take 2 tablets (1,600 mg total) by mouth 3 (three) times daily with meals. 05/14/23   Cleotilde Lukes, DO  spironolactone  (ALDACTONE ) 100 MG tablet Take 1 tablet (100 mg total) by mouth daily. 02/11/24   Will Almarie MATSU, MD    Allergies: Nsaids, Zestril [lisinopril], and Risperdal  [risperidone ]    Review of Systems  Gastrointestinal:  Positive for diarrhea.  All other systems reviewed and are negative.   Updated Vital Signs BP (!) 173/98 (BP Location: Right Wrist)   Pulse 87   Temp 97.9 F (36.6 C)   Resp 20   Ht 5' 8 (1.727 m)   Wt 76 kg   SpO2 100%   BMI 25.48 kg/m   Physical Exam Vitals and nursing note reviewed.   38 year old male, resting comfortably and in no acute distress. Vital signs are significant for elevated blood pressure. Oxygen saturation is 100%, which  is normal. Head is normocephalic and atraumatic. PERRLA, EOMI.  Neck is nontender and supple without adenopathy or JVD. Lungs are clear without rales, wheezes, or rhonchi. Chest is nontender.  Tunneled catheter is present in the right subclavian area. Heart has regular rate and rhythm with 2/6 systolic ejection murmur. Abdomen is soft, flat, nontender. Extremities: AV fistula is present in the left upper arm with thrill present.  Clotted AV fistula is present in the right upper arm. Skin is warm and dry without rash. Neurologic: Awake and alert, moves all extremities equally.  (all labs ordered are listed, but only abnormal results are displayed) Labs Reviewed  COMPREHENSIVE METABOLIC PANEL WITH GFR - Abnormal;  Notable for the following components:      Result Value   Chloride 95 (*)    CO2 21 (*)    BUN 66 (*)    Creatinine, Ser 16.21 (*)    Calcium  8.7 (*)    Albumin  3.1 (*)    Alkaline Phosphatase 164 (*)    GFR, Estimated 4 (*)    Anion gap 20 (*)    All other components within normal limits  CBC - Abnormal; Notable for the following components:   RBC 3.08 (*)    Hemoglobin 7.8 (*)    HCT 25.7 (*)    MCH 25.3 (*)    RDW 18.1 (*)    All other components within normal limits  LIPASE, BLOOD    EKG: EKG Interpretation Date/Time:  Friday March 18 2024 01:50:37 EST Ventricular Rate:  81 PR Interval:  152 QRS Duration:  82 QT Interval:  242 QTC Calculation: 281 R Axis:   5  Text Interpretation: Normal sinus rhythm Anterior infarct , age undetermined ST & T wave abnormality, consider inferior ischemia Abnormal ECG When compared with ECG of 04-Feb-2024 12:52, No significant change was found Confirmed by Raford Lenis ([id number]) on 03/18/2024 2:29:37 AM  Radiology: No results found.  Cardiac monitor shows normal sinus rhythm, per my interpretation. Procedures   Medications Ordered in the ED - No data to display                                  Medical Decision Making Amount and/or Complexity of Data Reviewed Labs: ordered.  Risk Decision regarding hospitalization.   Report of positive blood culture and patient with a tunneled catheter for dialysis.  There is concern that this is a source of infection and will need to be removed.  He will need to be admitted.  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  No evidence of acute blood loss.  I have reviewed his electrocardiogram, and my interpretation is ST and T changes unchanged  from prior ECG.  I have discussed the case with Dr. Maryjane of nephrology service who recommends admitting the patient for dialysis and they can then decide how to proceed regarding the tunneled catheter.  I have discussed the case with Dr. Diona of family practice teaching service who agrees to admit the patient.     Final diagnoses:  Bacteremia  End-stage renal disease on hemodialysis (HCC)  Anemia associated with chronic renal failure    ED Discharge Orders     None  Raford Lenis, MD 03/18/24 [id number]

## 2024-03-18 NOTE — Progress Notes (Signed)
 3.4 liters ultrafiltration, condition stable post hemodialysis, and report was given to the primary RN.

## 2024-03-18 NOTE — Progress Notes (Signed)
 Patient c/o pain on his hemodialysis catheter and also bilateral leg pain, rates leg pain  a 9/10. States it's uncomfortable. Per patient, tylenol  given earlier didn't do anything. Majeed, DO notified,order received for 1X oxycodone  and Tylenol  PRN.

## 2024-03-18 NOTE — Progress Notes (Signed)
 Pt receives out-pt HD at [facility] on MWF 12:20 pm chair time. Will assist as needed.   Randine Mungo Dialysis Navigator [phone]

## 2024-03-18 NOTE — Consult Note (Addendum)
 [facility] Renal Consultation Note    Indication for Consultation:  Management of ESRD/hemodialysis; anemia, hypertension/volume and secondary hyperparathyroidism   HPI: [name] is a 38 y.o. 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  He is admitted with recurrent Klebsiella bacteremia. Discharged from [facility] on 02/10/24 with Klebsiella oxytoca bacteremia. Completed 10 day course of cefepime  on 02/18/24.   Had blood cultures collected at outpatient dialysis unit on 11/3 which are positive for Klebsiella oxytoca. He was instructed to present to the ED for further management.   Labs reviewed - consistent with ESRD.  Seen and examined in the ED hallway. Afebrile. Hypertensive. On room air. Has noticed some drainage from catheter. He does report chills/rigors at home that usually happen after dialysis.  Goes home and wraps himself in blankets and turns the heat up. States he had a pretty bad episode during dialysis on Saturday which was unusual. Denies cough, chest pain, sob, n/v.   Dialysis MWFSat at [facility]. He is not completing full dialysis treatments. Most days getting less than 3 hour treatments. TDC in use. He dose have a left arm AVG that was placed in July. Most recent fistulogram in Sept showed patent graft. He states OP clinic tried to use the graft at his last treatment but unable to cannulate due to pulling clots    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   Heart murmur    Hypertension    Low blood sugar    Marijuana abuse    Noncompliance    Pericardial effusion 03/28/2023   Tobacco abuse    Past Surgical History:  Procedure  Laterality Date   A/V FISTULAGRAM Right 10/27/2022   Procedure: A/V Fistulagram;  Surgeon: Eliza Lonni RAMAN, MD;  Location: [facility] INVASIVE CV LAB;  Service: Cardiovascular;  Laterality: Right;   A/V FISTULAGRAM Left 01/27/2024   Procedure: A/V Fistulagram;  Surgeon: Lanis Fonda BRAVO, MD;  Location: [facility] INVASIVE CV LAB;  Service: Cardiovascular;  Laterality: Left;   APPENDECTOMY     AV FISTULA PLACEMENT Left 03/03/2019   Procedure: ARTERIOVENOUS (AV) FISTULA CREATION LEFT ARM;  Surgeon: Sheree Penne Lonni, MD;  Location: [facility] OR;  Service: Vascular;  Laterality: Left;   BASCILIC VEIN TRANSPOSITION Right 04/12/2019   Procedure: RIGHT UPPER EXTREMITY BASCILIC VEIN TRANSPOSITION FIRST STAGE FISTULA;  Surgeon: Eliza Lonni RAMAN, MD;  Location: [facility] OR;  Service: Vascular;  Laterality: Right;   BIOPSY  10/14/2021   Procedure: BIOPSY;  Surgeon: Legrand Victory LITTIE DOUGLAS, MD;  Location: [facility] ENDOSCOPY;  Service: Gastroenterology;;   BIOPSY  06/06/2022   Procedure: BIOPSY;  Surgeon: Shila Gustav GAILS, MD;  Location: [facility] ENDOSCOPY;  Service: Gastroenterology;;   BIOPSY  11/12/2022   Procedure: BIOPSY;  Surgeon: Wilhelmenia Aloha Raddle., MD;  Location: [facility] ENDOSCOPY;  Service: Gastroenterology;;   BIOPSY  12/05/2022   Procedure: BIOPSY;  Surgeon: Stacia Glendia BRAVO, MD;  Location: [facility] ENDOSCOPY;  Service: Gastroenterology;;   COLONOSCOPY WITH PROPOFOL  N/A 10/14/2021   Procedure: COLONOSCOPY WITH PROPOFOL ;  Surgeon: Legrand Victory LITTIE DOUGLAS, MD;  Location: [facility] ENDOSCOPY;  Service: Gastroenterology;  Laterality: N/A;   DIALYSIS/PERMA CATHETER INSERTION N/A 04/03/2023   Procedure: DIALYSIS/PERMA CATHETER INSERTION;  Surgeon: Magda Debby SAILOR, MD;  Location: [facility] INVASIVE CV LAB;  Service: Cardiovascular;  Laterality: N/A;   ESOPHAGOGASTRODUODENOSCOPY N/A 11/12/2022   Procedure: ESOPHAGOGASTRODUODENOSCOPY (EGD);  Surgeon: Wilhelmenia Aloha Raddle., MD;  Location: [facility] ENDOSCOPY;  Service: Gastroenterology;  Laterality: N/A;    ESOPHAGOGASTRODUODENOSCOPY N/A 12/05/2022   Procedure: ESOPHAGOGASTRODUODENOSCOPY (EGD);  Surgeon: Stacia Glendia BRAVO, MD;  Location: [facility] ENDOSCOPY;  Service: Gastroenterology;  Laterality: N/A;   ESOPHAGOGASTRODUODENOSCOPY (EGD) WITH PROPOFOL  N/A 06/06/2022   Procedure: ESOPHAGOGASTRODUODENOSCOPY (EGD) WITH PROPOFOL ;  Surgeon: Shila Gustav GAILS, MD;  Location: [facility] ENDOSCOPY;  Service: Gastroenterology;  Laterality: N/A;   FISTULA SUPERFICIALIZATION Right 11/07/2022   Procedure: PLICATION OF LARGE ANEURYSMS OF RIGHT ARTERIOVENOUS FISTULA WITH INSERTION OF 7mm INTERPOSITIONAL GORTEX GRAFT;  Surgeon: Eliza Lonni RAMAN, MD;  Location: [facility] OR;  Service: Vascular;  Laterality: Right;   FLEXIBLE SIGMOIDOSCOPY N/A 04/14/2022   Procedure: FLEXIBLE SIGMOIDOSCOPY;  Surgeon: Shila Gustav GAILS, MD;  Location: [facility] ENDOSCOPY;  Service: Gastroenterology;  Laterality: N/A;   INSERTION OF DIALYSIS CATHETER N/A 11/07/2022   Procedure: INSERTION OF RIGHT INTERNAL JUGULAR TUNNELED DIALYSIS CATHETER;  Surgeon: Eliza Lonni RAMAN, MD;  Location: [facility] OR;  Service: Vascular;  Laterality: N/A;   LIGATION OF ARTERIOVENOUS  FISTULA Left 03/06/2019   Procedure: LIGATION OF ARTERIOVENOUS  FISTULA;  Surgeon: Gretta Lonni PARAS, MD;  Location: [facility] OR;  Service: Vascular;  Laterality: Left;   PERIPHERAL VASCULAR BALLOON ANGIOPLASTY  10/27/2022   Procedure: PERIPHERAL VASCULAR BALLOON ANGIOPLASTY;  Surgeon: Eliza Lonni RAMAN, MD;  Location: [facility] INVASIVE CV LAB;  Service: Cardiovascular;;  rt upper arm fistula   SAVORY DILATION N/A 11/12/2022   Procedure: SAVORY DILATION;  Surgeon: Wilhelmenia Aloha Raddle., MD;  Location: [facility] ENDOSCOPY;  Service: Gastroenterology;  Laterality: N/A;   spinal tap     SPINE SURGERY     related to a spinal infection, unsure of surgery or infection source   TRANSESOPHAGEAL ECHOCARDIOGRAM (CATH LAB) N/A 04/02/2023   Procedure: TRANSESOPHAGEAL ECHOCARDIOGRAM;  Surgeon: Jeffrie Oneil BROCKS, MD;  Location:  [facility] INVASIVE CV LAB;  Service: Cardiovascular;  Laterality: N/A;   WISDOM TOOTH EXTRACTION     Family History  Problem Relation Age of Onset   Hypertension Mother    Kidney failure Mother    Diabetes Mother    Hearing loss Father    Hypertension Sister    Heart disease Maternal Grandmother    Stroke Maternal Grandfather    Asthma Son    Social History:  reports that he has been smoking cigarettes. He has never used smokeless tobacco. He reports that he does not currently use alcohol. He reports current drug use. Drug: Marijuana. Allergies  Allergen Reactions   Nsaids Other (See Comments)    Contraindication due to ckd    Zestril [Lisinopril] Swelling   Risperdal  [Risperidone ] Other (See Comments)    Unknown reaction    Prior to Admission medications   Medication Sig Start Date End Date Taking? Authorizing Provider  acetaminophen  (TYLENOL ) 500 MG tablet Take 1 tablet (500 mg total) by mouth every 6 (six) hours as needed for mild pain (pain score 1-3) or fever. 02/10/24   Will Almarie MATSU, MD  carvedilol  (COREG ) 25 MG tablet Take 1 tablet (25 mg total) by mouth 2 (two) times daily with a meal. 02/10/24   Will Almarie MATSU, MD  Cholecalciferol 1.25 MG (50000 UT) TABS Take 1 tablet  by mouth once a week. For 8 weeks 02/23/24   Baloch, Mahnoor, MD  cinacalcet  (SENSIPAR ) 30 MG tablet Take 1 tablet (30 mg total) by mouth every Monday, Wednesday, and Friday at 6 PM. 02/10/24   Will Almarie MATSU, MD  clotrimazole  (LOTRIMIN ) 1 % cream Apply topically 2 (two) times daily. 02/10/24   Will Almarie MATSU, MD  divalproex  (DEPAKOTE  ER) 500 MG 24 hr tablet Take 1 tablet (500 mg total) by mouth daily. 04/04/23   Joshua Domino, DO  isosorbide  mononitrate (ISMO ) 20 MG tablet Take 1 tablet (20 mg total) by mouth 2 (two) times daily. 02/10/24   Will Almarie MATSU, MD  losartan  (COZAAR ) 100 MG tablet Take 100 mg by mouth daily. 11/11/23   [provider]  minoxidil  (LONITEN ) 2.5 MG tablet  Take 1 tablet (2.5 mg total) by mouth 2 (two) times daily. 02/10/24   Will Almarie MATSU, MD  NIFEdipine  (ADALAT  CC) 90 MG 24 hr tablet Take 90 mg by mouth in the morning and at bedtime.    [provider]  pantoprazole  (PROTONIX ) 40 MG tablet Take 1 tablet (40 mg total) by mouth daily. 12/28/23   Babcock, Peter E, NP  sevelamer  carbonate (RENVELA ) 800 MG tablet Take 2 tablets (1,600 mg total) by mouth 3 (three) times daily with meals. 05/14/23   Cleotilde Lukes, DO  spironolactone  (ALDACTONE ) 100 MG tablet Take 1 tablet (100 mg total) by mouth daily. 02/11/24   Will Almarie MATSU, MD   Current Facility-Administered Medications  Medication Dose Route Frequency Provider Last Rate Last Admin   acetaminophen  (TYLENOL ) tablet 500 mg  500 mg Oral Q6H PRN Gomes, Adriana, DO       carvedilol  (COREG ) tablet 25 mg  25 mg Oral BID Gomes, Adriana, DO   25 mg at 03/18/24 1042   Chlorhexidine  Gluconate Cloth 2 % PADS 6 each  6 each Topical Q0600 Jerrye Katheryn BROCKS, MD       divalproex  (DEPAKOTE  ER) 24 hr tablet 500 mg  500 mg Oral Daily Gomes, Adriana, DO   500 mg at 03/18/24 1042   heparin  injection 5,000 Units  5,000 Units Subcutaneous Q8H Gomes, Adriana, DO       isosorbide  mononitrate (ISMO ) tablet 20 mg  20 mg Oral BID Gomes, Adriana, DO       losartan  (COZAAR ) tablet 100 mg  100 mg Oral Daily Gomes, Adriana, DO   100 mg at 03/18/24 1042   minoxidil  (LONITEN ) tablet 2.5 mg  2.5 mg Oral BID Gomes, Adriana, DO       NIFEdipine  (PROCARDIA -XL/NIFEDICAL-XL) 24 hr tablet 90 mg  90 mg Oral BID Gomes, Adriana, DO       pantoprazole  (PROTONIX ) EC tablet 40 mg  40 mg Oral Daily Gomes, Adriana, DO   40 mg at 03/18/24 1043   sevelamer  carbonate (RENVELA ) tablet 1,600 mg  1,600 mg Oral TID WC Gomes, Adriana, DO       spironolactone  (ALDACTONE ) tablet 100 mg  100 mg Oral Daily Gomes, Adriana, DO   100 mg at 03/18/24 1043   Current Outpatient Medications  Medication Sig Dispense Refill   acetaminophen  (TYLENOL )  500 MG tablet Take 1 tablet (500 mg total) by mouth every 6 (six) hours as needed for mild pain (pain score 1-3) or fever. 30 tablet 0   carvedilol  (COREG ) 25 MG tablet Take 1 tablet (25 mg total) by mouth 2 (two) times daily with a meal. 60 tablet 2   Cholecalciferol 1.25 MG (50000 UT) TABS  Take 1 tablet by mouth once a week. For 8 weeks 8 tablet 0   cinacalcet  (SENSIPAR ) 30 MG tablet Take 1 tablet (30 mg total) by mouth every Monday, Wednesday, and Friday at 6 PM. 60 tablet 2   clotrimazole  (LOTRIMIN ) 1 % cream Apply topically 2 (two) times daily. 30 g 0   divalproex  (DEPAKOTE  ER) 500 MG 24 hr tablet Take 1 tablet (500 mg total) by mouth daily. 30 tablet 0   isosorbide  mononitrate (ISMO ) 20 MG tablet Take 1 tablet (20 mg total) by mouth 2 (two) times daily. 180 tablet 2   losartan  (COZAAR ) 100 MG tablet Take 100 mg by mouth daily.     minoxidil  (LONITEN ) 2.5 MG tablet Take 1 tablet (2.5 mg total) by mouth 2 (two) times daily. 60 tablet 2   NIFEdipine  (ADALAT  CC) 90 MG 24 hr tablet Take 90 mg by mouth in the morning and at bedtime.     pantoprazole  (PROTONIX ) 40 MG tablet Take 1 tablet (40 mg total) by mouth daily. 30 tablet 3   sevelamer  carbonate (RENVELA ) 800 MG tablet Take 2 tablets (1,600 mg total) by mouth 3 (three) times daily with meals. 180 tablet 0   spironolactone  (ALDACTONE ) 100 MG tablet Take 1 tablet (100 mg total) by mouth daily. 30 tablet 2     ROS: As per HPI otherwise negative.  Physical Exam: Vitals:   03/18/24 0124 03/18/24 0137 03/18/24 0755 03/18/24 0757  BP: (!) 173/98  (!) 211/100   Pulse: 87     Resp: 20  (!) 22   Temp: 97.9 F (36.6 C)   97.6 F (36.4 C)  TempSrc:    Oral  SpO2: 100%     Weight:  76 kg    Height:  5' 8 (1.727 m)       General: Appears comfortable, in no distress, room air  Head: NCAT sclera not icteric MMM CV: Regular rate, no murmur, no rub  Pulm: normal respiratory effort, lungs clear  Abdomen: non-tender, no masses  Lower  extremities: 1-2+ LE edema bilaterally  Neuro: A & O X 3. Moves all extremities spontaneously. Psych:  Responds to questions appropriately with a normal affect. Dialysis Access: TDC;  LUE AVG + b/t, no warmth, tenderness, erythema   Labs: Basic Metabolic Panel: Recent Labs  Lab 03/18/24 0210  NA 136  K 4.1  CL 95*  CO2 21*  GLUCOSE 94  BUN 66*  CREATININE 16.21*  CALCIUM  8.7*   Liver Function Tests: Recent Labs  Lab 03/18/24 0210  AST 21  ALT 20  ALKPHOS 164*  BILITOT 0.7  PROT 7.9  ALBUMIN  3.1*   Recent Labs  Lab 03/18/24 0210  LIPASE 32   No results for input(s): AMMONIA in the last 168 hours. CBC: Recent Labs  Lab 03/18/24 0210  WBC 7.6  HGB 7.8*  HCT 25.7*  MCV 83.4  PLT 284   Cardiac Enzymes: No results for input(s): CKTOTAL, CKMB, CKMBINDEX, TROPONINI in the last 168 hours. CBG: No results for input(s): GLUCAP in the last 168 hours. Iron  Studies: No results for input(s): IRON , TIBC, TRANSFERRIN, FERRITIN in the last 72 hours. Studies/Results: No results found.  Dialysis Orders:  NW MWFSat  4:00 Nipro EDW 72.5kg 2K/2Ca AVG/ TDC  -No bolus heparin  order -Mircera 150 mcg q 2 weeks (last 11/3 -Calcitriol  1.0 q HD; Sensipar  30 q HD, sevelamer  800 2 q meals  -Last HD 11/5 post HD wt 77.4 kg   Assessment/Plan: Klebsiella bacteremia. Positive  BCx at OP unit on 11/3. Recurrent, was treated for Klebsiella bacteremia in Oct. Completed course of cefepime . Repeat cultures pending. Antibiotics per primary team. IR consulted for catheter removal.  ESRD. Now 4x/week HD. Keep on MWF schedule here. For dialysis today. Plan for catheter removal as above. Will try to use AVG today with HD. Hopefully will not require another cathter.  Hypertension. Severe. Resume home meds as able. Question compliance.  Volume. Weights up. UF to EDW as able.  Anemia. Hgb 7.8. ESA recently dosed as outpatient. Follow trends.  Metabolic bone disease.  Continue  home meds. Check phos with HD.   Maisie Ronnald Acosta PA-C  [facility] 03/18/2024, 11:23 AM     Seen and examined independently.  Agree with note and exam as documented above by physician extender and as noted here.  Mr. Kant is a 39 year old gentleman with a history of end-stage renal disease on hemodialysis Monday Wednesday Friday Saturday at [facility], hypertension, and hearing loss secondary to Alport's who presented to the ER at the direction of his outpatient dialysis unit.  He was found to have blood cultures positive for Klebsiella oxytoca.  These were collected on 11/3.  I spoke with the nurse at his outpatient clinic today.  The patient states that he has not had any fevers but he did have chills on treatment recently.  He has both a tunneled catheter as well as a left AV graft.  His clinic pulled clots from his graft when they attempted to access it recently.    General adult male in bed in no acute distress HEENT normocephalic atraumatic extraocular movements intact sclera anicteric Neck supple trachea midline Lungs clear to auscultation bilaterally normal work of breathing at rest on room air Heart S1S2 no rub Abdomen soft nontender nondistended Extremities no edema no cyanosis or clubbing Psych normal mood and affect Neuro - hard of hearing; alert and oriented x 3 provides hx and follows commands Access: RIJ tunneled catheter in place; left AVG with bruit   # End-stage renal disease - HD per Monday Wednesday Friday Saturday schedule at [facility] - Next HD Monday (after today's treatment)  # Klebsiella bacteremia - HD today and then removal of his catheter -Note that he does have a left arm graft however when the outpatient facility attempted to use it they were pulling clots.  He would need a shuntogram and possible tunneled catheter placement if his graft were felt not usable by IR  - Antibiotics per primary team - Blood cultures from today are ordered -  Repeat blood cultures after catheter removal to document clearance   # Hypertension - resume home meds - optimize volume status with HD  - note concern for compliance with meds   # Anemia of CKD - note recent ESA outpatient (given 11/3)  # Metabolic bone disease - Would resume home medications   Thank you for the consult.  Please do not hesitate to contact me with any questions regarding our patient   Katheryn JAYSON Saba, MD 03/18/2024  5:16 PM

## 2024-03-18 NOTE — Assessment & Plan Note (Signed)
 BP elevated to 211/100 on admission. - Restarted home medications: Carvedilol  25 mg BID, isosorbide  mononitrate 20 mg BID, losartan  100 mg daily, nifedipine  90 mg BID, and spironolactone  100 mg daily

## 2024-03-18 NOTE — ED Notes (Signed)
 When this writer returned from another task, found patient yelling at another nurse within the pod stating, why do you work here? Patient was asked to please calm down and NT attempted to calm patient down by speaking with him to find out what the problem was. Patient asked for charge nurse, charge nurse notified.

## 2024-03-18 NOTE — Plan of Care (Signed)
 FMTS Brief Progress Note  S: Night team to round on new patient. Patient reports he feels fine outside of being frustrated that he has to be in the hospital and weight for his catheter to be removed. Discussed plan for catheter will depend on Nephrology and Vascular Surgery.   O: BP (!) 217/115 (BP Location: Left Leg)   Pulse 91   Temp 98.3 F (36.8 C) (Oral)   Resp 18   Ht 5' 8 (1.727 m)   Wt 76 kg   SpO2 100%   BMI 25.48 kg/m   General: NAD, well appearing, sitting in hospital bed Neuro: A&O Respiratory: normal WOB on RA Extremities: Moving all 4 extremities equally   A/P: Bacteremia - VSS, still needs line holiday - Continue Cefepime  - Dialysis catheter replacement pending IR evaluation - Repeat Cultures pending - Orders reviewed. Labs for AM reviewed.  Edward Sharper, MD 03/18/2024, 8:29 PM PGY-3, [facility] Night Resident  Please page [phone] with questions.

## 2024-03-18 NOTE — ED Notes (Addendum)
 Pt is continuously asking staff for food since arrival to hall 26. Pt not being considerate to other pts in hallway and is unreceptive to staff direction to follow providers orders. Pt states what about what I say don't you understand? Da docta says I can eat!. Staff continue to reassure pt that he will be provided with a meal tray as soon as orders are placed and pt is cleared to eat. Provider contacted to ask for diet orders.

## 2024-03-18 NOTE — Assessment & Plan Note (Signed)
 Watery diarrhea ongoing for quite some time and has been on multiple abx, denies any hematuria. Normal abdominal exam. - C diff screen

## 2024-03-18 NOTE — Plan of Care (Signed)
   Problem: Education: Goal: Knowledge of General Education information will improve Description: Including pain rating scale, medication(s)/side effects and non-pharmacologic comfort measures Outcome: Progressing   Problem: Activity: Goal: Risk for activity intolerance will decrease Outcome: Progressing   Problem: Nutrition: Goal: Adequate nutrition will be maintained Outcome: Progressing

## 2024-03-18 NOTE — Assessment & Plan Note (Addendum)
 HD MWF. - Nephrology consulted, appreciate recommendations - Restarted Renvela  1600 mg TID with meals

## 2024-03-18 NOTE — Hospital Course (Addendum)
 [name] is a 38 y.o.male with a history of Alport Syndrome, HTN, Bipolar disorder, HFpEF, ESRD on HD who was admitted to the [facility] Medicine Teaching Service at [facility] for bacteremia. His hospital course is detailed below:  Bacteremia Patient presented with generalized chills, sweats, and repeat blood culture positive for Klebsiella oxytoca on 11/3.  Blood cultures were repeated while inpatient in the revealed Klebsiella growth.  Patient was started on cefepime  with HD per pharmacy for 2 weeks from line removal, with end date of 11/23.  Diarrhea Persistent diarrhea since last admission and has been on numerous antibiotics.  C. difficile screen was formed and revealed positive C Diff, with Antigen (+) and toxin (+) on 11/9. Patient placed on enteric precautions and started on Vancomycin  135 mg 4 times daily for 10 days until 11/20, where he will continue Vancomycin  once daily through 11/30 to complete OVP/Oral vanc prophylaxis.   End-Stage renal disease on HD Catheter removed for line holiday on 11/9, new catheter placement for 11/11.  Potassium levels were elevated.  IR delayed new catheter placement to 11/12.  In the meantime, patient had temporary catheter placed for his hemodialysis.  Potassium levels normalized and patient was able to have his new catheter placed 11/12.  Other chronic conditions were medically managed with home medications and formulary alternatives as necessary (HTN, Alport Syndrome, Anemia of CKD, Bipolar 1, HFpEF, GERD)  PCP Follow-up Recommendations: Address right back shoulder nodule; potentially needs Gen Surg referral for removal + biopsy VVS OP follow up for new dialysis access Ensure patient is taking his antibiotics correctly  Follow up with HTN medication changes and adjust accordingly

## 2024-03-18 NOTE — ED Notes (Signed)
Renal breakfast tray ordered. 

## 2024-03-18 NOTE — H&P (Addendum)
 Hospital Admission History and Physical Service Pager: [phone]  Patient name: [name] Medical record number: [id number] Date of Birth: July 15, 1985 Age: 38 y.o. Gender: male  Primary Care Provider: Lonnie Earnest, MD Consultants: Nephrology Code Status: Full Code Preferred Emergency Contact:  Contact Information     Name Relation Home Work Mobile   [place] Relative   [phone]   Jaymian, Bogart   [phone]   Chief Complaint: Positive blood culture from HD  Differential and Medical Decision Making:  MERVIL [name] is a 38 y.o. male with PMH of Alport syndrome, HTN, bipolar disorder, heart failure, ESRD on HD MWF presenting with diarrhea, chills, sweats and positive blood culture from outpatient HD.  Bacteremia is the most likely with positive blood culture along with chills and sweats with history of tunneled catheter for HD.  This is likely the source of infection and will require replacement.  Patient was hospitalized in September and was found to have Klebsiella oxytoca and Enterobacterales on blood cultures 9/25 with repeat negative blood cultures on 9/27.  Since his symptoms of chills and sweats persisted, his outpatient nephrologist repeated his blood culture on 11/3 which again revealed Klebsiella (outpatient records are being faxed to the hospital for more detailed information).  Patient has remained afebrile and has no leukocytosis.  He was previously treated with cefepime  2g thrice weekly with HD for 10 days, last day of antibiotics was 10/9.  At that time vascular surgery had evaluated the patient's left arm AVG and found no evidence for infection and suggested close monitoring.  Reportedly patient was restarted on antibiotics with HD on 11/5 with most recent blood culture.  There seems to be no other sources of infection at this time or other concerning sick symptoms.  Will defer getting a CXR at this time due to clear lung exam and no sick symptoms.   Repeating blood culture prior to starting antibiotics while inpatient.  Will perform C. difficile screen with history of diarrhea and antibiotic use. Assessment & Plan Bacteremia - Admit to FMTS, attending Dr. Anders - Med-Surg, Vital signs per floor - Renal diet with 1200 mL restriction - Heparin  for VTE ppx - Repeat blood culture x 2 - Start cefepime  2 g MWF with HD per pharmacy - AM RFP and CBC - Nephology to consult IR vs VVS to determine next steps with HD catheter Resistant hypertension BP elevated to 211/100 on admission. - Restarted home medications: Carvedilol  25 mg BID, isosorbide  mononitrate 20 mg BID, losartan  100 mg daily, nifedipine  90 mg BID, and spironolactone  100 mg daily Diarrhea Watery diarrhea ongoing for quite some time and has been on multiple abx, denies any hematuria. Normal abdominal exam. - C diff screen ESRD on dialysis (HCC) HD MWF. - Nephrology consulted, appreciate recommendations - Restarted Renvela  1600 mg TID with meals Chronic health problem Alport syndrome: Bilateral sensorineural hearing loss Anemia of Chronic Disease: Hgb 7.8, will continue to monitor for signs of bleeding and with CBC in AM Bipolar 1 disorder: Continue Depakote  500 mg daily Migraines: Continue Depakote  500 mg daily GERD: Continue Protonix  40 mg daily HFpEF: EF 60 to 65%, no RVMA, mildly dilated pulmonary artery with normal PAP  FEN/GI: Renal diet w/ 1200 mL fluid restriction VTE Prophylaxis: Heparin   Disposition: Med-Surg  History of Present Illness:  [name] is a 38 y.o. male presenting due to being called by his nephrologist to come in since he had a positive blood culture.  He has been on  antibiotics since his last dialysis session on 11/5.  Denies any fevers, but does endorse chills and sweats.  Patient was notified that he may have to have his tunneled catheter removed, but will still continue to have HD today.  Although there are also clotting issues with his AV  fistula in the left upper arm.  Nephrologist outpatient decided to get a repeat blood culture due to patient's symptoms of full body chills and sweats which is similar to symptoms he had before. Patient reports that his HD catheter has been in for a while.  Diarrhea since the last time he was in the hospital and got worse over the last three days. His appetite is adequate. Denies any abdominal pain, cramps, or blood in his urine or stool.  In the ED, patient was hypertensive to 211/100, otherwise vital signs stable.  CMP and CBC as expected for ESRD patient on HD with creatinine 16.21, BUN 66, calcium  8.7, AG 20, hemoglobin 7.8 and MCV 83.4.  Normal lipase of 32.  Nephrology was consulted by EDP who recommended pursuing HD today and will reach out to IR versus vascular to determine having the tunneled catheter replaced.  Review Of Systems: Per HPI  Pertinent Past Medical History: Alport syndrome ESRD on HD Secondary hyperparathyroidism HFpEF Bipolar 1 disorder GERD Resistant HTN Anemia of CKD Remainder reviewed in history tab.   Pertinent Past Surgical History: AV fistulogram June 2024 Right tunneled IJ dialysis catheter placement 04/03/2023 Left upper arm AV fistula/graft Appendectomy Peripheral vascular balloon angioplasty June 2024 TEE 04/02/2023 Remainder reviewed in history tab.   Pertinent Social History: Tobacco use: 1-2 Blacks daily Alcohol use: None Other Substance use: Marijuana use 2-3 blunts daily Lives with alone  Pertinent Family History: Mother: HTN, T2DM, CKD; deceased Father: Hearing loss Sister: HTN Son: Asthma  Important Outpatient Medications: Tylenol  Coreg  25 mg twice daily Cholecalciferol 1.25 mg weekly for 8 weeks (02/23/2024 -04/12/2024) Sensipar  30 mg MWF at 6 PM Clotrimazole  1% cream twice daily Depakote  500 mg daily Isosorbide  mononitrate 20 mg twice daily Losartan  100 mg daily Minoxidil  2.5 mg twice daily Nifedipine  90 mg twice  daily Protonix  40 mg daily Renvela   1600 mg 3 times daily with meals Spironolactone  100 mg daily  Objective: BP (!) 211/100   Pulse 87   Temp 97.6 F (36.4 C) (Oral)   Resp (!) 22   Ht 5' 8 (1.727 m)   Wt 76 kg   SpO2 100%   BMI 25.48 kg/m  Exam: General: Awake and Alert in NAD HEENT: NCAT. Sclera anicteric. No rhinorrhea. Cardiovascular: RRR. No M/R/G Respiratory: CTAB, normal WOB on RA. No wheezing, crackles, rhonchi, or diminished breath sounds. Abdomen: Soft, non-tender, non-distended. Bowel sounds normoactive Extremities: Able to move all extremities. No BLE edema, no deformities or significant joint findings. Skin: Warm and dry. No abrasions or rashes noted. Approx 5 x 4 cm raised well circumscribed mass on R shoulder. Neuro: A&Ox3. No focal neurological deficits.  Labs:  CBC BMET  Recent Labs  Lab 03/18/24 0210  WBC 7.6  HGB 7.8*  HCT 25.7*  PLT 284   Recent Labs  Lab 03/18/24 0210  NA 136  K 4.1  CL 95*  CO2 21*  BUN 66*  CREATININE 16.21*  GLUCOSE 94  CALCIUM  8.7*     EKG: NSR, rate 81, QTc prolonged at 281, abnormal P waves in septal leads  Imaging Studies Performed: none  Janna Ferrier, DO 03/18/2024, 10:31 AM PGY-2, [facility]  FPTS  Intern pager: [phone], text pages welcome Secure chat group [facility] Teaching Service

## 2024-03-18 NOTE — ED Notes (Signed)
 Dialysis called, saying it would be about an hour to an hour and a half and then they will be coming to get patient.

## 2024-03-19 ENCOUNTER — Other Ambulatory Visit (HOSPITAL_COMMUNITY): Payer: Self-pay

## 2024-03-19 ENCOUNTER — Inpatient Hospital Stay (HOSPITAL_COMMUNITY)

## 2024-03-19 DIAGNOSIS — N186 End stage renal disease: Secondary | ICD-10-CM | POA: Diagnosis not present

## 2024-03-19 DIAGNOSIS — Z992 Dependence on renal dialysis: Secondary | ICD-10-CM | POA: Diagnosis not present

## 2024-03-19 DIAGNOSIS — R2231 Localized swelling, mass and lump, right upper limb: Secondary | ICD-10-CM | POA: Insufficient documentation

## 2024-03-19 DIAGNOSIS — R7881 Bacteremia: Secondary | ICD-10-CM

## 2024-03-19 LAB — RENAL FUNCTION PANEL
Albumin: 2.7 g/dL — ABNORMAL LOW (ref 3.5–5.0)
Anion gap: 18 — ABNORMAL HIGH (ref 5–15)
BUN: 39 mg/dL — ABNORMAL HIGH (ref 6–20)
CO2: 23 mmol/L (ref 22–32)
Calcium: 8.3 mg/dL — ABNORMAL LOW (ref 8.9–10.3)
Chloride: 95 mmol/L — ABNORMAL LOW (ref 98–111)
Creatinine, Ser: 10.85 mg/dL — ABNORMAL HIGH (ref 0.61–1.24)
GFR, Estimated: 6 mL/min — ABNORMAL LOW (ref >60)
Glucose, Bld: 84 mg/dL (ref 70–99)
Phosphorus: 5.4 mg/dL — ABNORMAL HIGH (ref 2.5–4.6)
Potassium: 3.8 mmol/L (ref 3.5–5.1)
Sodium: 136 mmol/L (ref 135–145)

## 2024-03-19 LAB — CBC
HCT: 23.3 % — ABNORMAL LOW (ref 39.0–52.0)
Hemoglobin: 7.5 g/dL — ABNORMAL LOW (ref 13.0–17.0)
MCH: 26.1 pg (ref 26.0–34.0)
MCHC: 32.2 g/dL (ref 30.0–36.0)
MCV: 81.2 fL (ref 80.0–100.0)
Platelets: 269 K/uL (ref 150–400)
RBC: 2.87 MIL/uL — ABNORMAL LOW (ref 4.22–5.81)
RDW: 18.2 % — ABNORMAL HIGH (ref 11.5–15.5)
WBC: 7.7 K/uL (ref 4.0–10.5)
nRBC: 0 % (ref 0.0–0.2)

## 2024-03-19 LAB — HEPATITIS B SURFACE ANTIBODY, QUANTITATIVE: Hep B S AB Quant (Post): 158 m[IU]/mL

## 2024-03-19 MED ORDER — MELATONIN 3 MG PO TABS
3.0000 mg | ORAL_TABLET | Freq: Once | ORAL | Status: AC
  Administered 2024-03-19: 3 mg via ORAL
  Filled 2024-03-19: qty 1

## 2024-03-19 MED ORDER — ISOSORB DINITRATE-HYDRALAZINE 20-37.5 MG PO TABS
1.0000 | ORAL_TABLET | Freq: Three times a day (TID) | ORAL | Status: DC
  Administered 2024-03-19 – 2024-03-24 (×15): 1 via ORAL
  Filled 2024-03-19 (×17): qty 1

## 2024-03-19 NOTE — Assessment & Plan Note (Signed)
 No concern for infection at this time given exam and patient's report. - Will have patient follow-up outpatient for further management

## 2024-03-19 NOTE — Assessment & Plan Note (Addendum)
 HD MWF. Pt wanting to discuss graft, encouraged him to speak with nephro. - Nephrology following, appreciate recommendations - Renvela  1600 mg TID with meals - Cinacalcet  30 mg MWF

## 2024-03-19 NOTE — Consult Note (Addendum)
 [facility] [place]  ASSESSMENT / PLAN: 38 y.o. male with ESRD on hemodialysis via tunneled dialysis catheter.  He had a left arm AV graft placed at [facility] which has been difficult to use.  Fistulogram performed 01/26/2024 shows no issues with the graft.  I do not think another fistulogram will add much. He needs his [facility] removed for bacteremia.  Recommend IR consultation for [facility] removal.  Anticipate he will need new permanent dialysis access.  Would recommend doing this after he has completed an antibiotic course for bacteremia as an outpatient.  He has an appointment scheduled with Dr. Lanis as outpatient to discuss new access creation versus revision of existing AV graft.  Please call for questions  CHIEF COMPLAINT: Malfunctioning AV graft  HISTORY OF PRESENT ILLNESS: [name] is a 38 y.o. male with ESRD on HD via TDC.  He has Klebsiella bacteremia and is scheduled to have his [facility] removed with interventional radiology.  He had a left arm AV graft created at [facility] which has been difficult to use.  Fistulogram performed 01/26/2024 was unrevealing with no anatomic issues identified in the graft.  The graft has had a strong thrill for multiple providers.  The graft does not perform well at dialysis, with dialysis techs reporting pulling clots.  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   Heart murmur    Hypertension    Low blood sugar    Marijuana abuse    Noncompliance    Pericardial effusion 03/28/2023   Tobacco abuse     Past Surgical History:  Procedure Laterality Date   A/V FISTULAGRAM Right 10/27/2022   Procedure: A/V  Fistulagram;  Surgeon: Eliza Lonni RAMAN, MD;  Location: [facility] INVASIVE CV LAB;  Service: Cardiovascular;  Laterality: Right;   A/V FISTULAGRAM Left 01/27/2024   Procedure: A/V Fistulagram;  Surgeon: Lanis Fonda BRAVO, MD;  Location: [facility] INVASIVE CV LAB;  Service: Cardiovascular;  Laterality: Left;   APPENDECTOMY     AV FISTULA PLACEMENT Left 03/03/2019   Procedure: ARTERIOVENOUS (AV) FISTULA CREATION LEFT ARM;  Surgeon: Sheree Penne Lonni, MD;  Location: [facility] OR;  Service: Vascular;  Laterality: Left;   BASCILIC VEIN TRANSPOSITION Right 04/12/2019   Procedure: RIGHT UPPER EXTREMITY BASCILIC VEIN TRANSPOSITION FIRST STAGE FISTULA;  Surgeon: Eliza Lonni RAMAN, MD;  Location: [facility] OR;  Service: Vascular;  Laterality: Right;   BIOPSY  10/14/2021   Procedure: BIOPSY;  Surgeon: Legrand Victory LITTIE DOUGLAS, MD;  Location: [facility] ENDOSCOPY;  Service: Gastroenterology;;   BIOPSY  06/06/2022   Procedure: BIOPSY;  Surgeon: Shila Gustav GAILS, MD;  Location: [facility] ENDOSCOPY;  Service: Gastroenterology;;   BIOPSY  11/12/2022   Procedure: BIOPSY;  Surgeon: Wilhelmenia Aloha Raddle., MD;  Location: [facility] ENDOSCOPY;  Service: Gastroenterology;;   BIOPSY  12/05/2022   Procedure: BIOPSY;  Surgeon: Stacia Glendia BRAVO, MD;  Location: [facility] ENDOSCOPY;  Service: Gastroenterology;;   COLONOSCOPY WITH PROPOFOL  N/A 10/14/2021   Procedure: COLONOSCOPY WITH PROPOFOL ;  Surgeon: Legrand Victory LITTIE DOUGLAS, MD;  Location: [facility] ENDOSCOPY;  Service: Gastroenterology;  Laterality: N/A;  DIALYSIS/PERMA CATHETER INSERTION N/A 04/03/2023   Procedure: DIALYSIS/PERMA CATHETER INSERTION;  Surgeon: Magda Debby SAILOR, MD;  Location: [facility] INVASIVE CV LAB;  Service: Cardiovascular;  Laterality: N/A;   ESOPHAGOGASTRODUODENOSCOPY N/A 11/12/2022   Procedure: ESOPHAGOGASTRODUODENOSCOPY (EGD);  Surgeon: Wilhelmenia Aloha Raddle., MD;  Location[facility] ENDOSCOPY;  Service: Gastroenterology;  Laterality: N/A;   ESOPHAGOGASTRODUODENOSCOPY N/A 12/05/2022   Procedure:  ESOPHAGOGASTRODUODENOSCOPY (EGD);  Surgeon: Stacia Glendia BRAVO, MD;  Location: [facility] ENDOSCOPY;  Service: Gastroenterology;  Laterality: N/A;   ESOPHAGOGASTRODUODENOSCOPY (EGD) WITH PROPOFOL  N/A 06/06/2022   Procedure: ESOPHAGOGASTRODUODENOSCOPY (EGD) WITH PROPOFOL ;  Surgeon: Shila Gustav GAILS, MD;  Location: [facility] ENDOSCOPY;  Service: Gastroenterology;  Laterality: N/A;   FISTULA SUPERFICIALIZATION Right 11/07/2022   Procedure: PLICATION OF LARGE ANEURYSMS OF RIGHT ARTERIOVENOUS FISTULA WITH INSERTION OF 7mm INTERPOSITIONAL GORTEX GRAFT;  Surgeon: Eliza Lonni RAMAN, MD;  Location: [facility] OR;  Service: Vascular;  Laterality: Right;   FLEXIBLE SIGMOIDOSCOPY N/A 04/14/2022   Procedure: FLEXIBLE SIGMOIDOSCOPY;  Surgeon: Shila Gustav GAILS, MD;  Location: [facility] ENDOSCOPY;  Service: Gastroenterology;  Laterality: N/A;   INSERTION OF DIALYSIS CATHETER N/A 11/07/2022   Procedure: INSERTION OF RIGHT INTERNAL JUGULAR TUNNELED DIALYSIS CATHETER;  Surgeon: Eliza Lonni RAMAN, MD;  Location: [facility] OR;  Service: Vascular;  Laterality: N/A;   LIGATION OF ARTERIOVENOUS  FISTULA Left 03/06/2019   Procedure: LIGATION OF ARTERIOVENOUS  FISTULA;  Surgeon: Gretta Lonni PARAS, MD;  Location: [facility] OR;  Service: Vascular;  Laterality: Left;   PERIPHERAL VASCULAR BALLOON ANGIOPLASTY  10/27/2022   Procedure: PERIPHERAL VASCULAR BALLOON ANGIOPLASTY;  Surgeon: Eliza Lonni RAMAN, MD;  Location: [facility] INVASIVE CV LAB;  Service: Cardiovascular;;  rt upper arm fistula   SAVORY DILATION N/A 11/12/2022   Procedure: SAVORY DILATION;  Surgeon: Wilhelmenia Aloha Raddle., MD;  Location: [facility] ENDOSCOPY;  Service: Gastroenterology;  Laterality: N/A;   spinal tap     SPINE SURGERY     related to a spinal infection, unsure of surgery or infection source   TRANSESOPHAGEAL ECHOCARDIOGRAM (CATH LAB) N/A 04/02/2023   Procedure: TRANSESOPHAGEAL ECHOCARDIOGRAM;  Surgeon: Jeffrie Oneil BROCKS, MD;  Location: [facility] INVASIVE CV LAB;  Service: Cardiovascular;   Laterality: N/A;   WISDOM TOOTH EXTRACTION      Family History  Problem Relation Age of Onset   Hypertension Mother    Kidney failure Mother    Diabetes Mother    Hearing loss Father    Hypertension Sister    Heart disease Maternal Grandmother    Stroke Maternal Grandfather    Asthma Son     Social History   Socioeconomic History   Marital status: Married    Spouse name: Not on file   Number of children: 3   Years of education: Not on file   Highest education level: Not on file  Occupational History   Occupation: HVAC  Tobacco Use   Smoking status: Every Day    Current packs/day: 0.00    Types: Cigarettes    Last attempt to quit: 02/27/2016    Years since quitting: 8.0   Smokeless tobacco: Never   Tobacco comments:    Returned to smoking 2 black and milds per day  Vaping Use   Vaping status: Never Used  Substance and Sexual Activity   Alcohol use: Not Currently    Comment: rare   Drug use: Yes    Types: Marijuana   Sexual activity: Not Currently  Other Topics Concern   Not on file  Social History Narrative   Not on file   Social Drivers  of Health   Financial Resource Strain: High Risk (03/15/2024)   Overall Financial Resource Strain (CARDIA)    Difficulty of Paying Living Expenses: Very hard  Food Insecurity: Food Insecurity Present (03/15/2024)   Hunger Vital Sign    Worried About Running Out of Food in the Last Year: Often true    Ran Out of Food in the Last Year: Sometimes true  Transportation Needs: Patient Declined (03/15/2024)   PRAPARE - Administrator, Civil Service (Medical): Patient declined    Lack of Transportation (Non-Medical): Patient declined  Physical Activity: Inactive (06/02/2023)   Exercise Vital Sign    Days of Exercise per Week: 0 days    Minutes of Exercise per Session: 0 min  Stress: Stress Concern Present (03/15/2024)   [employer] of Occupational Health - Occupational Stress Questionnaire    Feeling of Stress: To  some extent  Social Connections: Moderately Isolated (03/15/2024)   Social Connection and Isolation Panel    Frequency of Communication with Friends and Family: More than three times a week    Frequency of Social Gatherings with Friends and Family: More than three times a week    Attends Religious Services: More than 4 times per year    Active Member of [employer] or Organizations: No    Attends Banker Meetings: Not on file    Marital Status: Divorced  Intimate Partner Violence: Not At Risk (01/21/2024)   Humiliation, Afraid, Rape, and Kick questionnaire    Fear of Current or Ex-Partner: No    Emotionally Abused: No    Physically Abused: No    Sexually Abused: No    Allergies  Allergen Reactions   Nsaids Other (See Comments)    Contraindication due to ckd    Zestril [Lisinopril] Swelling   Risperdal  [Risperidone ] Other (See Comments)    Unknown reaction     Current Facility-Administered Medications  Medication Dose Route Frequency Provider Last Rate Last Admin   acetaminophen  (TYLENOL ) tablet 1,000 mg  1,000 mg Oral Q6H PRN Lupie Credit, DO   1,000 mg at 03/19/24 1146   [START ON 03/21/2024] calcitRIOL  (ROCALTROL ) capsule 1 mcg  1 mcg Oral Q M,W,F-HD Ejigiri, Maisie Fellows, PA-C       carvedilol  (COREG ) tablet 25 mg  25 mg Oral BID Gomes, Adriana, DO   25 mg at 03/19/24 9061   ceFEPIme  (MAXIPIME ) 1 g in sodium chloride  0.9 % 100 mL IVPB  1 g Intravenous Q24H Anders Otto DASEN, MD   Stopped at 03/19/24 1225   Chlorhexidine  Gluconate Cloth 2 % PADS 6 each  6 each Topical Q0600 Jerrye Katheryn BROCKS, MD   6 each at 03/19/24 0525   cinacalcet  (SENSIPAR ) tablet 30 mg  30 mg Oral Q M,W,F-1800 Ejigiri, Ogechi Grace, PA-C   30 mg at 03/18/24 1948   divalproex  (DEPAKOTE  ER) 24 hr tablet 500 mg  500 mg Oral Daily Gomes, Adriana, DO   500 mg at 03/19/24 9060   heparin  injection 5,000 Units  5,000 Units Subcutaneous Q8H Gomes, Adriana, DO   5,000 Units at 03/19/24 1315   isosorbide -hydrALAZINE   (BIDIL) 20-37.5 MG per tablet 1 tablet  1 tablet Oral TID Larraine Palma, MD   1 tablet at 03/19/24 1602   losartan  (COZAAR ) tablet 100 mg  100 mg Oral Daily Gomes, Adriana, DO   100 mg at 03/19/24 9061   minoxidil  (LONITEN ) tablet 2.5 mg  2.5 mg Oral BID Gomes, Adriana, DO   2.5 mg at 03/19/24 [phone]  NIFEdipine  (PROCARDIA -XL/NIFEDICAL-XL) 24 hr tablet 90 mg  90 mg Oral BID Janna Ferrier, DO   90 mg at 03/19/24 9060   pantoprazole  (PROTONIX ) EC tablet 40 mg  40 mg Oral Daily Janna Ferrier, DO   40 mg at 03/19/24 9061   sevelamer  carbonate (RENVELA ) tablet 1,600 mg  1,600 mg Oral TID WC Janna Ferrier, DO   1,600 mg at 03/19/24 1147   spironolactone  (ALDACTONE ) tablet 100 mg  100 mg Oral Daily Janna, Ferrier, DO   100 mg at 03/19/24 0938    PHYSICAL EXAM Vitals:   03/18/24 2056 03/19/24 0508 03/19/24 0909 03/19/24 1557  BP: (!) 188/77 (!) 189/96 (!) 189/101 (!) 187/111  Pulse: 89 81 86 80  Resp: 19 20 18 20   Temp: 98.4 F (36.9 C) 97.9 F (36.6 C) 98 F (36.7 C) 98.2 F (36.8 C)  TempSrc: Oral   Oral  SpO2: 99% 97% 99% 100%  Weight:      Height:       No acute distress Regular rate and rhythm Unlabored breathing Strong thrill left arm AV graft without signs of infection TDC in right chest without external signs of infection  PERTINENT LABORATORY AND RADIOLOGIC DATA  Most recent CBC    Latest Ref Rng & Units 03/19/2024    4:36 AM 03/18/2024    9:50 PM 03/18/2024    2:10 AM  CBC  WBC 4.0 - 10.5 K/uL 7.7  9.7  7.6   Hemoglobin 13.0 - 17.0 g/dL 7.5  7.2  7.8   Hematocrit 39.0 - 52.0 % 23.3  22.6  25.7   Platelets 150 - 400 K/uL 269  297  284      Most recent CMP    Latest Ref Rng & Units 03/19/2024    4:36 AM 03/18/2024    9:50 PM 03/18/2024    2:10 AM  CMP  Glucose 70 - 99 mg/dL 84   94   BUN 6 - 20 mg/dL 39   66   Creatinine 9.38 - 1.24 mg/dL 89.14  89.67  83.78   Sodium 135 - 145 mmol/L 136   136   Potassium 3.5 - 5.1 mmol/L 3.8   4.1   Chloride 98 - 111 mmol/L 95    95   CO2 22 - 32 mmol/L 23   21   Calcium  8.9 - 10.3 mg/dL 8.3   8.7   Total Protein 6.5 - 8.1 g/dL   7.9   Total Bilirubin 0.0 - 1.2 mg/dL   0.7   Alkaline Phos 38 - 126 U/L   164   AST 15 - 41 U/L   21   ALT 0 - 44 U/L   20     Renal function Estimated Creatinine Clearance: 9.1 mL/min (A) (by C-G formula based on SCr of 10.85 mg/dL (H)).  Hgb A1c MFr Bld (%)  Date Value  01/21/2024 4.3 (L)    LDL Chol Calc (NIH)  Date Value Ref Range Status  02/18/2024 48 0 - 99 mg/dL Final    Debby SAILOR. Magda, MD [facility] Phone Number: [phone] 03/19/2024 4:50 PM   Total time spent on preparing this encounter including chart review, data review, collecting history, examining the patient, and coordinating care: 60 min  Portions of this report may have been transcribed using voice recognition software.  Every effort has been made to ensure accuracy; however, inadvertent computerized transcription errors may still be present.

## 2024-03-19 NOTE — Assessment & Plan Note (Addendum)
-   ID consulted, appreciate recommendations as above - Abx: IV Cefepime  1g daily (11/7- ) - Plan for cath removal with IR, hopefully tomorrow 11/9 - TTE ordered per ID recs - Blood cultures: No growth < 12h - Will plan to repeat blood cultures after catheter removal to show clearance per nephro - Pain: Tylenol  1000 mg q6h PRN - Labs: AM RFP, CBC

## 2024-03-19 NOTE — Assessment & Plan Note (Addendum)
 BPs in 180s/100s, improved from admission but still elevated. - Continuing home meds: Carvedilol  25 mg BID, isosorbide  dinitrate 20 mg BID, losartan  100 mg daily, nifedipine  90 mg BID, spironolactone  100 mg daily, minoxidil  2.5 mg BID - Nephrology asked about starting hydralazine  for patient (was on this at last hospitalization, unclear why not continued at discharge); could start patient on Bidil to avoid increased pill burden

## 2024-03-19 NOTE — Progress Notes (Signed)
 [facility] Progress Note   Subjective:   Completed dialysis yesterday - net UF 3.4L. Unable to cannulate graft.  No events overnight. slept well. Endorses some pain around catheter - plan for removal today per IR.  Denies f/c, cp, sob.   Objective Vitals:   03/18/24 1759 03/18/24 1857 03/18/24 2056 03/19/24 0508  BP: (!) 209/102 (!) 217/115 (!) 188/77 (!) 189/96  Pulse: 88 91 89 81  Resp: 19 18 19 20   Temp: 98.6 F (37 C) 98.3 F (36.8 C) 98.4 F (36.9 C) 97.9 F (36.6 C)  TempSrc:  Oral Oral   SpO2: 100% 100% 99% 97%  Weight:      Height:         Additional Objective Labs: Basic Metabolic Panel: Recent Labs  Lab 03/18/24 0210 03/18/24 2150 03/19/24 0436  NA 136  --  136  K 4.1  --  3.8  CL 95*  --  95*  CO2 21*  --  23  GLUCOSE 94  --  84  BUN 66*  --  39*  CREATININE 16.21* 10.32* 10.85*  CALCIUM  8.7*  --  8.3*  PHOS  --   --  5.4*   CBC: Recent Labs  Lab 03/18/24 0210 03/18/24 2150 03/19/24 0436  WBC 7.6 9.7 7.7  HGB 7.8* 7.2* 7.5*  HCT 25.7* 22.6* 23.3*  MCV 83.4 82.2 81.2  PLT 284 297 269   Blood Culture    Component Value Date/Time   SDES BLOOD RIGHT HAND 03/18/2024 2150   SPECREQUEST  03/18/2024 2150    BOTTLES DRAWN AEROBIC AND ANAEROBIC Blood Culture results may not be optimal due to an inadequate volume of blood received in culture bottles   CULT  03/18/2024 2150    NO GROWTH < 12 HOURS Performed at [facility], [address] [place], [place] [address]    REPTSTATUS PENDING 03/18/2024 2150    Physical Exam General: Alert, nad  Heart: RRR Lungs: Clear, normal wob Abdomen: non -tender Extremities: no LE edema  Dialysis Access: TDC; LUE AVG +bruit   Medications:  ceFEPime  (MAXIPIME ) IV Stopped (03/19/24 0000)    [START ON 03/21/2024] calcitRIOL   1 mcg Oral Q M,W,F-HD   carvedilol   25 mg Oral BID   Chlorhexidine  Gluconate Cloth  6 each Topical Q0600   cinacalcet   30 mg Oral Q M,W,F-1800   divalproex    500 mg Oral Daily   heparin   5,000 Units Subcutaneous Q8H   isosorbide  dinitrate  20 mg Oral BID   losartan   100 mg Oral Daily   minoxidil   2.5 mg Oral BID   NIFEdipine   90 mg Oral BID   pantoprazole   40 mg Oral Daily   sevelamer  carbonate  1,600 mg Oral TID WC   spironolactone   100 mg Oral Daily    Dialysis Orders:  NW MWFSat  4:00 Nipro EDW 72.5kg 2K/2Ca AVG/ TDC  -No bolus heparin  order -Mircera 150 mcg q 2 weeks (last 11/3) -Calcitriol  1.0 q HD; Sensipar  30 q HD, sevelamer  800 2 q meals  Last HD 11/5 -- post HD wt 77.4 kg    Assessment/Plan: Klebsiella bacteremia. Blood Cx 11/3 from OP HD unit  +Klebsiella oxytoca.  He was treated for Klebsiella bacteremia in Oct and completed course of cefepime . Repeat cultures ngtd. On cefepime . IR consulted for catheter removal.  ESRD. Now 4x/week HD. Keep on MWF schedule here. Had HD Fri. Plan for catheter removal as above.  Access. Using [facility].  L arm  AVG placed at [facility] 11/2023. Have been unable to cannulate graft successfully to date. Consider shuntogram while inpatient. Consult vascular for evaluation.  Hypertension. Severe. Home meds resumed. Follow.  Volume. Weights up. UF to EDW as able.  Anemia. Hgb 7.5. ESA recently dosed as outpatient. Follow trends.  Metabolic bone disease.  Ca/Phos acceptable. Continue home meds.   Maisie Ronnald Acosta PA-C [facility] 03/19/2024,9:00 AM

## 2024-03-19 NOTE — Plan of Care (Signed)
  Problem: Health Behavior/Discharge Planning: Goal: Ability to manage health-related needs will improve Outcome: Progressing   Problem: Clinical Measurements: Goal: Ability to maintain clinical measurements within normal limits will improve Outcome: Progressing Goal: Diagnostic test results will improve Outcome: Progressing  Still awaiting HD cath out. Limited by scheduling. Pt frustrated but agrees to stay in hospital

## 2024-03-19 NOTE — Assessment & Plan Note (Signed)
 Alport syndrome: Bilateral sensorineural hearing loss Anemia of Chronic Disease: S/p ESA outpatient 11/3. Continue to optimize with nephrology. Hgb of 7.5 this am, stable from 7.2 yesterday. Bipolar 1 disorder: Continue Depakote  500 mg daily Migraines: Continue Depakote  500 mg daily GERD: Continue Protonix  40 mg daily HFpEF: EF 60 to 65%, no RVMA, mildly dilated pulmonary artery with normal PAP

## 2024-03-19 NOTE — Assessment & Plan Note (Addendum)
 Ongoing watery diarrhea and with multiple abx, concern for C diff. - C diff screen pending, enteric precautions in place until resulted - Will double check with RN about loose stools, dc screen if none recently

## 2024-03-19 NOTE — Progress Notes (Signed)
 Daily Progress Note Intern Pager: [phone]  Patient name: [name] Medical record number: [id number] Date of birth: 08-31-1985 Age: 38 y.o. Gender: male  Primary Care Provider: Lonnie Earnest, MD Consultants: Nephrology, IR, ID Code Status: Full  Pt Overview and Major Events to Date:  - 11/7: Admitted, nephro consulted  Medical Decision Making:  [name] is a 38 y.o. male with PMH of Alport syndrome (with bilateral hearing loss), HTN, bipolar disorder, heart failure, ESRD on HD MWF. Admitted for chills with positive Bcx from outpatient HD (collected 11/3) showing Klebsiella oxytoca. Suspect source of infection from HD catheter, planning for removal with IR during this admission.  Initially, plan for IR to remove cath today.  However, IR received a slew of emergency interventions and will not be able to complete this today.  ID was consulted re: antibiotic changes. Spoke with Dr. Montie Bologna, who recommended continuing IV Cefepime  daily for now. She recommended a TTE to evaluate for endocarditis. She also recommended good back/spine exam tomorrow and to consider MRI following if any concern for spinal infection. Also agreed with repeating blood cultures after catheter removed. Assessment & Plan Klebsiella Bacteremia - ID consulted, appreciate recommendations as above - Abx: IV Cefepime  1g daily (11/7- ) - Plan for cath removal with IR, hopefully tomorrow 11/9 - TTE ordered per ID recs - Blood cultures: No growth < 12h - Will plan to repeat blood cultures after catheter removal to show clearance per nephro - Pain: Tylenol  1000 mg q6h PRN - Labs: AM RFP, CBC End-stage renal disease on hemodialysis (HCC) HD MWF. Pt wanting to discuss graft, encouraged him to speak with nephro. - Nephrology following, appreciate recommendations - Renvela  1600 mg TID with meals - Cinacalcet  30 mg MWF Resistant hypertension BPs in 180s/100s, improved from admission but still  elevated. - Continuing home meds: Carvedilol  25 mg BID, isosorbide  dinitrate 20 mg BID, losartan  100 mg daily, nifedipine  90 mg BID, spironolactone  100 mg daily, minoxidil  2.5 mg BID - Nephrology asked about starting hydralazine  for patient (was on this at last hospitalization, unclear why not continued at discharge); could start patient on Bidil to avoid increased pill burden Diarrhea Ongoing watery diarrhea and with multiple abx, concern for C diff. - C diff screen pending, enteric precautions in place until resulted - Will double check with RN about loose stools, dc screen if none recently Mass of skin of right shoulder No concern for infection at this time given exam and patient's report. - Will have patient follow-up outpatient for further management Chronic health problem Alport syndrome: Bilateral sensorineural hearing loss Anemia of Chronic Disease: S/p ESA outpatient 11/3. Continue to optimize with nephrology. Hgb of 7.5 this am, stable from 7.2 yesterday. Bipolar 1 disorder: Continue Depakote  500 mg daily Migraines: Continue Depakote  500 mg daily GERD: Continue Protonix  40 mg daily HFpEF: EF 60 to 65%, no RVMA, mildly dilated pulmonary artery with normal PAP  FEN/GI: Renal diet with 1200 mL fluid restriction PPx: Heparin  Dispo: Home pending removal of ESRD cath and decision re: abx regimen following.  Subjective:  Patient reports he is feeling fine this morning, with no reported symptoms other than having some increased gas.  He denies having a bowel movement yet today, last was yesterday.  Per his report, this was not that watery, and more of his symptoms are related to gas.   He does report he has a nodule on his right back shoulder which has been present for 2 to  3 months.  Was supposed to have this worked up outpatient, but was unable to get this done prior to needing hospitalization again.  Reports this is uncomfortable to sleep with.  Objective: Temp:  [97.9 F (36.6  C)-98.6 F (37 C)] 97.9 F (36.6 C) (11/08 0508) Pulse Rate:  [78-93] 81 (11/08 0508) Resp:  [15-21] 20 (11/08 0508) BP: (188-230)/(77-202) 189/96 (11/08 0508) SpO2:  [96 %-100 %] 97 % (11/08 0508) Physical Exam: General: Patient in bed with head of bed elevated, no acute distress Cardiovascular: Regular rate and rhythm, no murmurs/rubs/gallops. Respiratory: Normal work of breathing on room air. Clear to auscultation bilaterally; no wheezes, crackles. Abdomen: Bowel sounds present and normoactive bilaterally. Soft, nondistended, nontender. Extremities: Skin warm, dry. No bilateral lower extremity edema. Back: 3-4 cm nodule on right posterior shoulder, minimally tender to palpation, soft, mobile, no overlying skin changes, no warmth, no drainage.  Laboratory: Most recent CBC Lab Results  Component Value Date   WBC 7.7 03/19/2024   HGB 7.5 (L) 03/19/2024   HCT 23.3 (L) 03/19/2024   MCV 81.2 03/19/2024   PLT 269 03/19/2024   Most recent BMP    Latest Ref Rng & Units 03/19/2024    4:36 AM  BMP  Glucose 70 - 99 mg/dL 84   BUN 6 - 20 mg/dL 39   Creatinine 9.38 - 1.24 mg/dL 89.14   Sodium 864 - 854 mmol/L 136   Potassium 3.5 - 5.1 mmol/L 3.8   Chloride 98 - 111 mmol/L 95   CO2 22 - 32 mmol/L 23   Calcium  8.9 - 10.3 mg/dL 8.3     Larraine Palma, MD 03/19/2024, 8:02 AM  PGY-1, [facility] FPTS Intern pager: [phone], text pages welcome Secure chat group [facility] Teaching Service

## 2024-03-19 NOTE — Plan of Care (Signed)
 Received message from RN that patient was talking about leaving AMA.  Went up to see patient and discussed this with him.  Patient expressed frustration at still not having gotten his dialysis cath taken out.  Also reported frustration at having so many recent hospitalizations.  Informed patient of dangers to his health and potential even risk of death should he leave the hospital against our medical advice and before we can remove the dialysis cath and effectively treat his bacteremia.  Also informed patient of our plan to continue daily IV antibiotics and get imaging of his heart to look for infection.  Patient expressed understanding, and agrees to remain in the hospital at this time.  He is requesting a change in his diet entheses as he reports he does not follow a renal diet outside of the hospital; also requesting off the floor privileges (while still remaining hospitalized). - Diet changed to regular - Off the floor with RN accompaniment order placed; patient does not need enteric, contact, or droplet precautions

## 2024-03-19 NOTE — Progress Notes (Signed)
 Echocardiogram 2D Echocardiogram has been performed.  [name] 03/19/2024, 5:22 PM

## 2024-03-20 DIAGNOSIS — Q8781 Alport syndrome: Secondary | ICD-10-CM

## 2024-03-20 DIAGNOSIS — Z992 Dependence on renal dialysis: Secondary | ICD-10-CM | POA: Diagnosis not present

## 2024-03-20 DIAGNOSIS — D649 Anemia, unspecified: Secondary | ICD-10-CM

## 2024-03-20 DIAGNOSIS — R7881 Bacteremia: Secondary | ICD-10-CM | POA: Diagnosis not present

## 2024-03-20 DIAGNOSIS — B961 Klebsiella pneumoniae [K. pneumoniae] as the cause of diseases classified elsewhere: Secondary | ICD-10-CM | POA: Diagnosis not present

## 2024-03-20 DIAGNOSIS — R197 Diarrhea, unspecified: Secondary | ICD-10-CM

## 2024-03-20 DIAGNOSIS — N186 End stage renal disease: Secondary | ICD-10-CM | POA: Diagnosis not present

## 2024-03-20 DIAGNOSIS — A0472 Enterocolitis due to Clostridium difficile, not specified as recurrent: Secondary | ICD-10-CM

## 2024-03-20 LAB — CBC
HCT: 19.8 % — ABNORMAL LOW (ref 39.0–52.0)
HCT: 22.3 % — ABNORMAL LOW (ref 39.0–52.0)
Hemoglobin: 6.2 g/dL — CL (ref 13.0–17.0)
Hemoglobin: 6.9 g/dL — CL (ref 13.0–17.0)
MCH: 25.7 pg — ABNORMAL LOW (ref 26.0–34.0)
MCH: 25.7 pg — ABNORMAL LOW (ref 26.0–34.0)
MCHC: 31.3 g/dL (ref 30.0–36.0)
MCHC: 30.9 g/dL (ref 30.0–36.0)
MCV: 82.2 fL (ref 80.0–100.0)
MCV: 83.2 fL (ref 80.0–100.0)
Platelets: 248 K/uL (ref 150–400)
Platelets: 278 K/uL (ref 150–400)
RBC: 2.41 MIL/uL — ABNORMAL LOW (ref 4.22–5.81)
RBC: 2.68 MIL/uL — ABNORMAL LOW (ref 4.22–5.81)
RDW: 18 % — ABNORMAL HIGH (ref 11.5–15.5)
RDW: 18.4 % — ABNORMAL HIGH (ref 11.5–15.5)
WBC: 5.7 K/uL (ref 4.0–10.5)
WBC: 5.5 K/uL (ref 4.0–10.5)
nRBC: 0 % (ref 0.0–0.2)
nRBC: 0 % (ref 0.0–0.2)

## 2024-03-20 LAB — ECHOCARDIOGRAM COMPLETE
AR max vel: 3.09 cm2
AV Area VTI: 3.36 cm2
AV Area mean vel: 2.94 cm2
AV Mean grad: 8.6 mmHg
AV Peak grad: 15 mmHg
Ao pk vel: 1.93 m/s
Area-P 1/2: 3.85 cm2
Height: 68 in
S' Lateral: 3.5 cm
Weight: 2680.79 [oz_av]

## 2024-03-20 LAB — C DIFFICILE QUICK SCREEN W PCR REFLEX
C Diff antigen: POSITIVE — AB
C Diff interpretation: DETECTED
C Diff toxin: POSITIVE — AB

## 2024-03-20 LAB — RENAL FUNCTION PANEL
Albumin: 2.5 g/dL — ABNORMAL LOW (ref 3.5–5.0)
Anion gap: 18 — ABNORMAL HIGH (ref 5–15)
BUN: 50 mg/dL — ABNORMAL HIGH (ref 6–20)
CO2: 21 mmol/L — ABNORMAL LOW (ref 22–32)
Calcium: 8.2 mg/dL — ABNORMAL LOW (ref 8.9–10.3)
Chloride: 93 mmol/L — ABNORMAL LOW (ref 98–111)
Creatinine, Ser: 12.41 mg/dL — ABNORMAL HIGH (ref 0.61–1.24)
GFR, Estimated: 5 mL/min — ABNORMAL LOW (ref >60)
Glucose, Bld: 69 mg/dL — ABNORMAL LOW (ref 70–99)
Phosphorus: 5.6 mg/dL — ABNORMAL HIGH (ref 2.5–4.6)
Potassium: 4.1 mmol/L (ref 3.5–5.1)
Sodium: 132 mmol/L — ABNORMAL LOW (ref 135–145)

## 2024-03-20 LAB — HEMOGLOBIN AND HEMATOCRIT, BLOOD
HCT: 23.6 % — ABNORMAL LOW (ref 39.0–52.0)
Hemoglobin: 7.3 g/dL — ABNORMAL LOW (ref 13.0–17.0)

## 2024-03-20 LAB — GLUCOSE, CAPILLARY
Glucose-Capillary: 74 mg/dL (ref 70–99)
Glucose-Capillary: 74 mg/dL (ref 70–99)

## 2024-03-20 LAB — PREPARE RBC (CROSSMATCH)

## 2024-03-20 MED ORDER — VANCOMYCIN HCL 125 MG PO CAPS
125.0000 mg | ORAL_CAPSULE | Freq: Four times a day (QID) | ORAL | Status: DC
  Administered 2024-03-20 – 2024-03-24 (×16): 125 mg via ORAL
  Filled 2024-03-20 (×19): qty 1

## 2024-03-20 MED ORDER — SODIUM CHLORIDE 0.9% IV SOLUTION
Freq: Once | INTRAVENOUS | Status: AC
Start: 2024-03-20 — End: 2024-03-20

## 2024-03-20 MED ORDER — HYDROMORPHONE HCL 1 MG/ML IJ SOLN
0.5000 mg | Freq: Once | INTRAMUSCULAR | Status: AC
  Administered 2024-03-20: 0.5 mg via INTRAVENOUS
  Filled 2024-03-20: qty 0.5

## 2024-03-20 NOTE — Procedures (Signed)
 Brief Interventional Radiology Note:  Tunneled Hemodialysis line removed at the bedside on 11/9 at the request of attending, nurse, patient, and Dr. Karalee.   -5cc Lido with epi used -Tunneled catheter came out without difficulty  -Patient tolerated procedure well -Pressure held until hemostasis achieved, approximately 3 minutes -Dressing applied -Catheter tip collected should team want culture  Please reach out to IR with any questions/concerns regarding procedure.   Electronically Signed: Antion Andres M Dondre Catalfamo, PA-C 03/20/2024, 12:31 PM

## 2024-03-20 NOTE — Assessment & Plan Note (Signed)
 Alport syndrome: Bilateral sensorineural hearing loss Anemia of Chronic Disease: S/p ESA outpatient 11/3. Continue to optimize with nephrology. Hgb of 7.5 this am, stable from 7.2 yesterday. Bipolar 1 disorder: Continue Depakote  500 mg daily Migraines: Continue Depakote  500 mg daily GERD: Continue Protonix  40 mg daily HFpEF: EF 60 to 65%, no RVMA, mildly dilated pulmonary artery with normal PAP

## 2024-03-20 NOTE — Assessment & Plan Note (Signed)
 No concern for infection at this time given exam and patient's report. - Will have patient follow-up outpatient for further management

## 2024-03-20 NOTE — Progress Notes (Signed)
 Pharmacy Antibiotic Note  [name] is a 38 y.o. male admitted on 03/18/2024 with bacteremia.  Pharmacy has been consulted for cefepime  dosing and oral vancomycin  dosing. - Blood cultures from 11/3 positive again with klebsiella oxytoca from aerobic and anaerobic bottles. Culture report uploaded under media tab - Pharmacy consulted to order oral vancomycin  for Cdiff antigen (+) and Cdiff toxin(+) results (03/20/2024)   Plan: - Oral vancomycin  125 mg 4 times daily for 10 days  - Cefepime  1g IV Q24h  - Monitor clinical progression, length of therapy, opportunity to de-escalate  Height: 5' 8 (172.7 cm) Weight:  (unable to get bed weight in ed stretcher also pt doesnt want to stand on scale) IBW/kg (Calculated) : 68.4  Temp (24hrs), Avg:98 F (36.7 C), Min:97.6 F (36.4 C), Max:98.4 F (36.9 C)  Recent Labs  Lab 03/18/24 0210 03/18/24 2150 03/19/24 0436 03/20/24 0255  WBC 7.6 9.7 7.7 5.7  CREATININE 16.21* 10.32* 10.85* 12.41*    Estimated Creatinine Clearance: 8 mL/min (A) (by C-G formula based on SCr of 12.41 mg/dL (H)).    Allergies  Allergen Reactions   Nsaids Other (See Comments)    Contraindication due to ckd    Zestril [Lisinopril] Swelling   Risperdal  [Risperidone ] Other (See Comments)    Unknown reaction     Microbiology results: 9/25 BCx: klebsiella oxytoca  11/3 Bcx: Klebsiella oxytoca (R - cefazolin , I - Unasyn)  - not seen in Epic, faxed from HD center in media tab 11/7 Bcx: NGTD 11/9 Cdiff panel = Antigen (+) and toxin (+)  Thank you for allowing pharmacy to be a part of this patient's care.  Feliciano Close, PharmD PGY2 Infectious Diseases Pharmacy Resident  03/20/2024 1:18 PM

## 2024-03-20 NOTE — Progress Notes (Addendum)
 [facility] Progress Note   Subjective:   Seen in room -eating breakfast Awaiting catheter removal today -delayed. Encouraged him to be patient.  Denies cp, sob, n/v.   Objective Vitals:   03/19/24 1557 03/19/24 1925 03/20/24 0426 03/20/24 0820  BP: (!) 187/111 (!) 179/89 (!) 203/114 (!) 201/118  Pulse: 80 85 81 80  Resp: 20 18 17 19   Temp: 98.2 F (36.8 C) 97.6 F (36.4 C) 98.4 F (36.9 C) 97.6 F (36.4 C)  TempSrc: Oral Oral  Oral  SpO2: 100% 98% 99% 100%  Weight:      Height:         Additional Objective Labs: Basic Metabolic Panel: Recent Labs  Lab 03/18/24 0210 03/18/24 2150 03/19/24 0436 03/20/24 0255  NA 136  --  136 132*  K 4.1  --  3.8 4.1  CL 95*  --  95* 93*  CO2 21*  --  23 21*  GLUCOSE 94  --  84 69*  BUN 66*  --  39* 50*  CREATININE 16.21* 10.32* 10.85* 12.41*  CALCIUM  8.7*  --  8.3* 8.2*  PHOS  --   --  5.4* 5.6*   CBC: Recent Labs  Lab 03/18/24 0210 03/18/24 2150 03/19/24 0436 03/20/24 0255 03/20/24 0700  WBC 7.6 9.7 7.7 5.7  --   HGB 7.8* 7.2* 7.5* 6.2* 7.3*  HCT 25.7* 22.6* 23.3* 19.8* 23.6*  MCV 83.4 82.2 81.2 82.2  --   PLT 284 297 269 248  --    Blood Culture    Component Value Date/Time   SDES BLOOD RIGHT HAND 03/18/2024 2150   SPECREQUEST  03/18/2024 2150    BOTTLES DRAWN AEROBIC AND ANAEROBIC Blood Culture results may not be optimal due to an inadequate volume of blood received in culture bottles   CULT  03/18/2024 2150    NO GROWTH 2 DAYS Performed at [facility], [address] [place], [place] [address]    REPTSTATUS PENDING 03/18/2024 2150    Physical Exam General: Alert, nad  Heart: RRR Lungs: Clear, normal wob Abdomen: non -tender Extremities: no LE edema  Dialysis Access: TDC; LUE AVG +bruit   Medications:  ceFEPime  (MAXIPIME ) IV Stopped (03/19/24 1225)    [START ON 03/21/2024] calcitRIOL   1 mcg Oral Q M,W,F-HD   carvedilol   25 mg Oral BID   Chlorhexidine  Gluconate Cloth  6 each  Topical Q0600   cinacalcet   30 mg Oral Q M,W,F-1800   divalproex   500 mg Oral Daily   heparin   5,000 Units Subcutaneous Q8H   isosorbide -hydrALAZINE   1 tablet Oral TID   losartan   100 mg Oral Daily   minoxidil   2.5 mg Oral BID   NIFEdipine   90 mg Oral BID   pantoprazole   40 mg Oral Daily   sevelamer  carbonate  1,600 mg Oral TID WC   spironolactone   100 mg Oral Daily    Dialysis Orders:  NW MWFSat  4:00 Nipro EDW 72.5kg 2K/2Ca AVG/ TDC  -No bolus heparin  order -Mircera 150 mcg q 2 weeks (last 11/3) -Calcitriol  1.0 q HD; Sensipar  30 q HD, sevelamer  800 2 q meals  Last HD 11/5 -- post HD wt 77.4 kg    Assessment/Plan: Klebsiella bacteremia. Blood Cx 11/3 from OP HD unit  +Klebsiella oxytoca.  He was treated for Klebsiella bacteremia in Oct and completed course of cefepime . Repeat here cultures ngtd. On cefepime . Echo pending. Will need line holiday. IR consulted for catheter removal.  ESRD. Now 4x/week  HD. Keep on MWF schedule here. Had HD Fri. Plan for catheter removal as above. Next HD likely Tues due to line holiday  Access. Using [facility].  L arm AVG placed at [facility] 11/2023. Graft patent on f'gram in Sept. Have been unable to cannulate graft successfully to date. Appreciate VVS input - no need for another shuntogram. Will most likely need new perm access. F/u as outpatient.  Hypertension. Severe. Home meds resumed. Follow.  Volume. Weights up. UF to EDW as able.  Anemia. Hgb 7.0 ESA recently dosed as outpatient. Follow trends. Transfuse prn.  Metabolic bone disease.  Ca/Phos acceptable. Continue home meds.  Nutrition. On regular diet with fluid restriction. Counseled on low K diet choices.   Maisie Ronnald Acosta PA-C [facility] 03/20/2024,9:12 AM

## 2024-03-20 NOTE — Progress Notes (Addendum)
 Daily Progress Note Intern Pager: [phone]  Patient name: [name] Medical record number: [id number] Date of birth: 10/02/1985 Age: 38 y.o. Gender: male  Primary Care Provider: Lonnie Earnest, MD Consultants: Nephrology, IR, ID Code Status: Full  Pt Overview and Major Events to Date:  11/7: Admitted to FMTS, Nephro consulted  Assessment and Plan: [name] is a 38 y.o. male with a PMHx of Alport syndrome with bilateral hearing loss, HTN, bipolar disorder, heart failure, ESRD on HD MWF.  Admitted for chills with positive BCx from outpatient HD (collected 11/3) showing Klebsiella oxytoca.  Suspect source of infection from HD catheter. IR removed catheter today without complications, catheter tip collected by IR for culture and blood culture ordered following cath removal per nephrology recs.   Per ID, assessment of the spine completed to assess for any concern for spinal infection. Assessment completed which showed no evidence of infection with no pain or discomfort reported with flexion, extension, or rotation and no signs of erythema, swelling, or warmth with no tenderness to palpation over the spine and surrounding areas.  Assessment & Plan Klebsiella Bacteremia Per ID, recommend good back/spine exam to consider MRI following if any concern for spinal infection.  Spinal exam today negative for any concerns, only positive for lipoma on right posterior shoulder.  - ID consulted, appreciate recommendations - Nephrology consulted, appreciate recommendations - Cath removed 11/9 - Urine culture ordered from catheter tip - Blood culture ordered per Nephro and ID recs s/p cath removal to show clearance  - Hold Heparin  pending 1500 CBC - TTE ordered per ID recs - Abx: IV Cefepime  1g daily (11/7- ) - Pain: Tylenol  1000 mg q6h PRN - Labs: AM RFP, CBC End-stage renal disease on hemodialysis (HCC) HD MWF. - Nephrology following, appreciate recommendations - Renvela  1600 mg  TID with meals - Cinacalcet  30 mg MWF Resistant hypertension BP improved to 136/63 - Continuing home meds: Carvedilol  25 mg BID, losartan  100 mg daily, nifedipine  90 mg BID, spironolactone  100 mg daily, minoxidil  2.5 mg BID - Nephrology recommended hydralazine , to reduce pill burden BiDil 20-37.5 mg started, home med isosorbide  dinitrate discontinued - Monitor closely Diarrhea Ongoing watery diarrhea and with multiple abx - C diff positive - Enteric precautions in place - Vanc per pharm Mass of skin of right shoulder No concern for infection at this time given exam and patient's report. - Will have patient follow-up outpatient for further management Chronic health problem Alport syndrome: Bilateral sensorineural hearing loss Anemia of Chronic Disease: S/p ESA outpatient 11/3. Continue to optimize with nephrology. Hgb of 7.5 this am, stable from 7.2 yesterday. Bipolar 1 disorder: Continue Depakote  500 mg daily Migraines: Continue Depakote  500 mg daily GERD: Continue Protonix  40 mg daily HFpEF: EF 60 to 65%, no RVMA, mildly dilated pulmonary artery with normal PAP   FEN/GI: Regular Diet PPx: Heparin  Dispo:Progressive  Subjective:  Patient found sitting up in bed in no acute distress.  Patient continuously states he is agitated due to not being able to eat and threatened to leave AMA multiple times if he continues to go without food.  With nurse, message IR about diet and confirmed no need for NPO and put the patient on regular diet. Patient appreciative about being able to eat.   Able to examine back for any concerns of spinal infection, though patient asked repeatedly if I could come back to complete examination as he is really hungry and very agitated at the moment.   Objective:  Temp:  [97.6 F (36.4 C)-98.4 F (36.9 C)] 98 F (36.7 C) (11/09 1132) Pulse Rate:  [80-85] 83 (11/09 1132) Resp:  [16-20] 16 (11/09 1132) BP: (136-203)/(62-118) 136/62 (11/09 1132) SpO2:  [98 %-100  %] 99 % (11/09 1132)  Physical Exam: General: Patient lying in bed,  no acute distress Cardio: Regular rate, regular rhythm, no murmurs on exam. Pulm: Clear, no wheezing, no crackles. No increased work of breathing Abdominal: bowel sounds present, soft, non-tender, non-distended Extremities: no peripheral edema  Back: No signs of erythema, swelling, or warmth over the spine or surrounding areas. Lipoma noted on R posterior shoulder. No tenderness or pain upon palpation along the spine. No pain or discomfort reported with flexion, extension, or rotation, though range of motion limited due to patient agitation   Laboratory: Most recent CBC Lab Results  Component Value Date   WBC 5.7 03/20/2024   HGB 7.3 (L) 03/20/2024   HCT 23.6 (L) 03/20/2024   MCV 82.2 03/20/2024   PLT 248 03/20/2024   Most recent BMP    Latest Ref Rng & Units 03/20/2024    2:55 AM  BMP  Glucose 70 - 99 mg/dL 69   BUN 6 - 20 mg/dL 50   Creatinine 9.38 - 1.24 mg/dL 87.58   Sodium 864 - 854 mmol/L 132   Potassium 3.5 - 5.1 mmol/L 4.1   Chloride 98 - 111 mmol/L 93   CO2 22 - 32 mmol/L 21   Calcium  8.9 - 10.3 mg/dL 8.2     Other pertinent labs: H&H: Hb 7.3, Hct 23.6  Edward Palma, MD 03/20/2024, 12:38 PM  PGY-1, [facility] FPTS Intern pager: [phone], text pages welcome Secure chat group [facility] Teaching Service

## 2024-03-20 NOTE — Consult Note (Addendum)
 [facility]  Total days of antibiotics 3  Reason for Consult:klebsiella bacteremia    Referring Physician: eniola  Principal Problem:   Klebsiella Bacteremia Active Problems:   Anemia associated with chronic renal failure   End-stage renal disease on hemodialysis (HCC)   Diarrhea   Chronic health problem   Resistant hypertension   Mass of skin of right shoulder    HPI: [name] is a 38 y.o. 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 He was found to be afebrile, wbc WNL however infectious work up with blood cx + klebseilla bacteremia. In addition, stool studies showing +c.difficile. patient denies cough, nor skin infection, nor pain at his fistula. He denies blood in stool or abdominal cramping, just frequent watery diarrhea.  Interestingly, he was hospitalized in September with kleb oxytoca bactremia, where he was treated but appears to have had the same line staying in place. His follow up blood cx on 11/3 still showed kleb per report.  He was restarted on cefepime  on 11/5  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   Heart murmur    Hypertension    Low blood sugar    Marijuana abuse    Noncompliance    Pericardial effusion 03/28/2023   Tobacco abuse     Allergies:  Allergies  Allergen Reactions   Nsaids Other (See Comments)    Contraindication due to ckd    Zestril [Lisinopril] Swelling   Risperdal  [Risperidone ] Other (See Comments)    Unknown reaction     Current  antibiotics:   MEDICATIONS:  [START ON 03/21/2024] calcitRIOL   1 mcg Oral Q M,W,F-HD   carvedilol   25 mg Oral BID   Chlorhexidine  Gluconate Cloth  6 each Topical Q0600   cinacalcet   30 mg Oral Q M,W,F-1800   divalproex   500 mg Oral Daily   heparin   5,000 Units Subcutaneous Q8H   isosorbide -hydrALAZINE   1 tablet Oral TID   losartan   100 mg Oral Daily   minoxidil   2.5 mg Oral BID   NIFEdipine   90 mg Oral BID   pantoprazole   40 mg Oral Daily   sevelamer  carbonate  1,600 mg Oral TID WC   spironolactone   100 mg Oral Daily   vancomycin   125 mg Oral QID    Social History   Tobacco Use   Smoking status: Every Day    Current packs/day: 0.00    Types: Cigarettes    Last attempt to quit: 02/27/2016    Years since quitting: 8.0   Smokeless tobacco: Never   Tobacco comments:    Returned to smoking 2 black and milds per day  Vaping Use   Vaping status: Never Used  Substance Use Topics   Alcohol use: Not Currently    Comment: rare   Drug use: Yes    Types: Marijuana    Family History  Problem Relation Age of Onset  Hypertension Mother    Kidney failure Mother    Diabetes Mother    Hearing loss Father    Hypertension Sister    Heart disease Maternal Grandmother    Stroke Maternal Grandfather    Asthma Son     Review of Systems -  12 point ros is otherwise negative  OBJECTIVE: Temp:  [97.6 F (36.4 C)-98.4 F (36.9 C)] 98.2 F (36.8 C) (11/09 1501) Pulse Rate:  [80-85] 84 (11/09 1501) Resp:  [16-19] 16 (11/09 1501) BP: (136-203)/(62-118) 185/88 (11/09 1501) SpO2:  [98 %-100 %] 100 % (11/09 1501) Physical Exam  Constitutional: He is oriented to person, place, and time. He appears well-developed and well-nourished. No distress.  HENT:  Mouth/Throat: Oropharynx is clear and moist. No oropharyngeal exudate.  Cardiovascular: Normal rate, regular rhythm and normal heart sounds. Exam reveals no gallop and no friction rub.  No murmur heard.  Pulmonary/Chest: Effort  normal and breath sounds normal. No respiratory distress. He has no wheezes.  Abdominal: Soft. Bowel sounds are normal. He exhibits no distension. There is no tenderness.  Ext:no pain to left arm fistula site Neurological: [name] and oriented to person, place, and time.  Skin: Skin is warm and dry. No rash noted. No erythema.  Psychiatric: He has a normal mood and affect. His behavior is normal.    LABS: Results for orders placed or performed during the hospital encounter of 03/18/24 (from the past 48 hours)  Culture, blood (routine x 2)     Status: None (Preliminary result)   Collection Time: 03/18/24  9:50 PM   Specimen: BLOOD RIGHT HAND  Result Value Ref Range   Specimen Description BLOOD RIGHT HAND    Special Requests      BOTTLES DRAWN AEROBIC AND ANAEROBIC Blood Culture results may not be optimal due to an inadequate volume of blood received in culture bottles   Culture      NO GROWTH 2 DAYS Performed at [facility], [address] [place], [place] [address]    Report Status PENDING   CBC     Status: Abnormal   Collection Time: 03/18/24  9:50 PM  Result Value Ref Range   WBC 9.7 4.0 - 10.5 K/uL   RBC 2.75 (L) 4.22 - 5.81 MIL/uL   Hemoglobin 7.2 (L) 13.0 - 17.0 g/dL   HCT 77.3 (L) 60.9 - 47.9 %   MCV 82.2 80.0 - 100.0 fL   MCH 26.2 26.0 - 34.0 pg   MCHC 31.9 30.0 - 36.0 g/dL   RDW 81.6 (H) 88.4 - 84.4 %   Platelets 297 150 - 400 K/uL   nRBC 0.0 0.0 - 0.2 %    Comment: Performed at [facility] Lab, [address], [place], [place] [address]  Creatinine, serum     Status: Abnormal   Collection Time: 03/18/24  9:50 PM  Result Value Ref Range   Creatinine, Ser 10.32 (H) 0.61 - 1.24 mg/dL   GFR, Estimated 6 (L) >60 mL/min    Comment: (NOTE) Calculated using the CKD-EPI Creatinine Equation (2021) Performed at [facility], [address] [place], [place] [address]   Renal function panel     Status: Abnormal   Collection Time: 03/19/24  4:36 AM   Result Value Ref Range   Sodium 136 135 - 145 mmol/L   Potassium 3.8 3.5 - 5.1 mmol/L   Chloride 95 (L) 98 - 111 mmol/L   CO2 23 22 - 32 mmol/L  Glucose, Bld 84 70 - 99 mg/dL    Comment: Glucose reference range applies only to samples taken after fasting for at least 8 hours.   BUN 39 (H) 6 - 20 mg/dL   Creatinine, Ser 89.14 (H) 0.61 - 1.24 mg/dL   Calcium  8.3 (L) 8.9 - 10.3 mg/dL   Phosphorus 5.4 (H) 2.5 - 4.6 mg/dL   Albumin  2.7 (L) 3.5 - 5.0 g/dL   GFR, Estimated 6 (L) >60 mL/min    Comment: (NOTE) Calculated using the CKD-EPI Creatinine Equation (2021)    Anion gap 18 (H) 5 - 15    Comment: Performed at [facility] Lab, [address] [place], [place] [address]  CBC     Status: Abnormal   Collection Time: 03/19/24  4:36 AM  Result Value Ref Range   WBC 7.7 4.0 - 10.5 K/uL   RBC 2.87 (L) 4.22 - 5.81 MIL/uL   Hemoglobin 7.5 (L) 13.0 - 17.0 g/dL   HCT 76.6 (L) 60.9 - 47.9 %   MCV 81.2 80.0 - 100.0 fL   MCH 26.1 26.0 - 34.0 pg   MCHC 32.2 30.0 - 36.0 g/dL   RDW 81.7 (H) 88.4 - 84.4 %   Platelets 269 150 - 400 K/uL   nRBC 0.0 0.0 - 0.2 %    Comment: Performed at [facility], [address] [place], [place] [address]  Renal function panel     Status: Abnormal   Collection Time: 03/20/24  2:55 AM  Result Value Ref Range   Sodium 132 (L) 135 - 145 mmol/L   Potassium 4.1 3.5 - 5.1 mmol/L   Chloride 93 (L) 98 - 111 mmol/L   CO2 21 (L) 22 - 32 mmol/L   Glucose, Bld 69 (L) 70 - 99 mg/dL    Comment: Glucose reference range applies only to samples taken after fasting for at least 8 hours.   BUN 50 (H) 6 - 20 mg/dL   Creatinine, Ser 87.58 (H) 0.61 - 1.24 mg/dL   Calcium  8.2 (L) 8.9 - 10.3 mg/dL   Phosphorus 5.6 (H) 2.5 - 4.6 mg/dL   Albumin  2.5 (L) 3.5 - 5.0 g/dL   GFR, Estimated 5 (L) >60 mL/min    Comment: (NOTE) Calculated using the CKD-EPI Creatinine Equation (2021)    Anion gap 18 (H) 5 - 15    Comment: Performed at [facility] Lab, [address]  [place], [place] [address]  CBC     Status: Abnormal   Collection Time: 03/20/24  2:55 AM  Result Value Ref Range   WBC 5.7 4.0 - 10.5 K/uL   RBC 2.41 (L) 4.22 - 5.81 MIL/uL   Hemoglobin 6.2 (LL) 13.0 - 17.0 g/dL    Comment: REPEATED TO VERIFY This critical result has been called to LEVON CHANCY, RN by Ezzie Custodio on 03/20/2024 03:52:13, and has been read back.    HCT 19.8 (L) 39.0 - 52.0 %   MCV 82.2 80.0 - 100.0 fL   MCH 25.7 (L) 26.0 - 34.0 pg   MCHC 31.3 30.0 - 36.0 g/dL   RDW 81.9 (H) 88.4 - 84.4 %   Platelets 248 150 - 400 K/uL   nRBC 0.0 0.0 - 0.2 %    Comment: Performed at [facility] Lab, [address], [place], [place] [address]  Glucose, capillary     Status: None   Collection Time: 03/20/24  5:07 AM  Result Value Ref Range   Glucose-Capillary 74  70 - 99 mg/dL    Comment: Glucose reference range applies only to samples taken after fasting for at least 8 hours.  C Difficile Quick Screen w PCR reflex     Status: Abnormal   Collection Time: 03/20/24  6:46 AM   Specimen: STOOL  Result Value Ref Range   C Diff antigen POSITIVE (A) NEGATIVE   C Diff toxin POSITIVE (A) NEGATIVE   C Diff interpretation Toxin producing C. difficile detected.     Comment: RESULT CALLED TO, READ BACK BY AND VERIFIED WITH: RN Rosina SQUIBB on [id number] @1252  by sm Performed at [facility], [address] [place], [place] [address]   Hemoglobin and hematocrit, blood     Status: Abnormal   Collection Time: 03/20/24  7:00 AM  Result Value Ref Range   Hemoglobin 7.3 (L) 13.0 - 17.0 g/dL   HCT 76.3 (L) 60.9 - 47.9 %    Comment: Performed at [facility], [address], [place], [place] [address]  Type and screen [facility]     Status: None   Collection Time: 03/20/24  7:00 AM  Result Value Ref Range   ABO/RH(D) O POS    Antibody Screen NEG    Sample Expiration      03/23/2024,2359 Performed at [facility], [address], [place], [place] [address]    Glucose, capillary     Status: None   Collection Time: 03/20/24  8:22 AM  Result Value Ref Range   Glucose-Capillary 74 70 - 99 mg/dL    Comment: Glucose reference range applies only to samples taken after fasting for at least 8 hours.  CBC     Status: Abnormal   Collection Time: 03/20/24  3:36 PM  Result Value Ref Range   WBC 5.5 4.0 - 10.5 K/uL   RBC 2.68 (L) 4.22 - 5.81 MIL/uL   Hemoglobin 6.9 (LL) 13.0 - 17.0 g/dL    Comment: REPEATED TO VERIFY This critical result has been called to [facility] PROCTOR GANN RN by Florencia Dine on 03/20/2024 16:13:42, and has been read back.    HCT 22.3 (L) 39.0 - 52.0 %   MCV 83.2 80.0 - 100.0 fL   MCH 25.7 (L) 26.0 - 34.0 pg   MCHC 30.9 30.0 - 36.0 g/dL   RDW 81.5 (H) 88.4 - 84.4 %   Platelets 278 150 - 400 K/uL   nRBC 0.0 0.0 - 0.2 %    Comment: Performed at [facility], [address], [place], [place] [address]    MICRO:  IMAGING: ECHOCARDIOGRAM COMPLETE Result Date: 03/20/2024    ECHOCARDIOGRAM REPORT   Patient Name:   [name] Date of Exam: 03/19/2024 Medical Rec #:  [id number]       Height:       68.0 in Accession #:    [id number]      Weight:       167.5 lb Date of Birth:  [birth year]       BSA:          1.896 m Patient Age:    38 years        BP:           187/111 mmHg Patient Gender: M               HR:           84 bpm. Exam Location:  Inpatient Procedure: 2D Echo, Cardiac Doppler and  Color Doppler (Both Spectral and Color            Flow Doppler were utilized during procedure). Indications:    Bacteremia R78.81  History:        Patient has prior history of Echocardiogram examinations, most                 recent 01/30/2024. CHF, Migraines; Risk Factors:Hypertension and                 Current Smoker.  Sonographer:    Thea Norlander RCS Referring Phys: [facility] IMPRESSIONS  1. Left ventricular ejection fraction, by estimation, is 60 to 65%. The left ventricle has normal function. The left ventricle has no regional wall motion  abnormalities. There is moderate left ventricular hypertrophy. Left ventricular diastolic parameters are indeterminate.  2. Right ventricular systolic function is normal. The right ventricular size is normal. There is mildly elevated pulmonary artery systolic pressure.  3. Left atrial size was moderately dilated.  4. Right atrial size was moderately dilated.  5. The mitral valve is normal in structure. Trivial mitral valve regurgitation. No evidence of mitral stenosis.  6. The tricuspid valve is abnormal. Tricuspid valve regurgitation is moderate.  7. The aortic valve is tricuspid. Aortic valve regurgitation is not visualized. No aortic stenosis is present.  8. The inferior vena cava is dilated in size with >50% respiratory variability, suggesting right atrial pressure of 8 mmHg. FINDINGS  Left Ventricle: Left ventricular ejection fraction, by estimation, is 60 to 65%. The left ventricle has normal function. The left ventricle has no regional wall motion abnormalities. The left ventricular internal cavity size was normal in size. There is  moderate left ventricular hypertrophy. Left ventricular diastolic parameters are indeterminate. Right Ventricle: The right ventricular size is normal. Right vetricular wall thickness was not well visualized. Right ventricular systolic function is normal. There is mildly elevated pulmonary artery systolic pressure. The tricuspid regurgitant velocity  is 2.74 m/s, and with an assumed right atrial pressure of 8 mmHg, the estimated right ventricular systolic pressure is 38.0 mmHg. Left Atrium: Left atrial size was moderately dilated. Right Atrium: Right atrial size was moderately dilated. Pericardium: There is no evidence of pericardial effusion. Mitral Valve: The mitral valve is normal in structure. Trivial mitral valve regurgitation. No evidence of mitral valve stenosis. Tricuspid Valve: The tricuspid valve is abnormal. Tricuspid valve regurgitation is moderate . No evidence of  tricuspid stenosis. Aortic Valve: The aortic valve is tricuspid. Aortic valve regurgitation is not visualized. No aortic stenosis is present. Aortic valve mean gradient measures 8.6 mmHg. Aortic valve peak gradient measures 15.0 mmHg. Aortic valve area, by VTI measures 3.36  cm. Pulmonic Valve: The pulmonic valve was not well visualized. Pulmonic valve regurgitation is mild. No evidence of pulmonic stenosis. Aorta: The aortic root and ascending aorta are structurally normal, with no evidence of dilitation. Venous: The inferior vena cava is dilated in size with greater than 50% respiratory variability, suggesting right atrial pressure of 8 mmHg. IAS/Shunts: No atrial level shunt detected by color flow Doppler.  LEFT VENTRICLE PLAX 2D LVIDd:         5.10 cm   Diastology LVIDs:         3.50 cm   LV e' medial:    6.98 cm/s LV PW:         1.30 cm   LV E/e' medial:  16.8 LV IVS:        1.30 cm   LV e' lateral:  13.10 cm/s LVOT diam:     2.20 cm   LV E/e' lateral: 8.9 LV SV:         113 LV SV Index:   60 LVOT Area:     3.80 cm  RIGHT VENTRICLE            IVC RV S prime:     8.93 cm/s  IVC diam: 2.90 cm TAPSE (M-mode): 1.8 cm LEFT ATRIUM           Index        RIGHT ATRIUM           Index LA diam:      4.70 cm 2.48 cm/m   RA Area:     24.80 cm LA Vol (A2C): 73.1 ml 38.56 ml/m  RA Volume:   79.30 ml  41.83 ml/m LA Vol (A4C): 82.7 ml 43.63 ml/m  AORTIC VALVE AV Area (Vmax):    3.09 cm AV Area (Vmean):   2.94 cm AV Area (VTI):     3.36 cm AV Vmax:           193.42 cm/s AV Vmean:          138.357 cm/s AV VTI:            0.336 m AV Peak Grad:      15.0 mmHg AV Mean Grad:      8.6 mmHg LVOT Vmax:         157.00 cm/s LVOT Vmean:        107.000 cm/s LVOT VTI:          0.297 m LVOT/AV VTI ratio: 0.88  AORTA Ao Root diam: 3.60 cm Ao Asc diam:  3.00 cm MITRAL VALVE                TRICUSPID VALVE MV Area (PHT): 3.85 cm     TR Peak grad:   30.0 mmHg MV Decel Time: 197 msec     TR Vmax:        274.00 cm/s MV E velocity:  117.00 cm/s MV A velocity: 46.60 cm/s   SHUNTS MV E/A ratio:  2.51         Systemic VTI:  0.30 m                             Systemic Diam: 2.20 cm Dorn Ross MD Electronically signed by Dorn Ross MD Signature Date/Time: 03/20/2024/1:32:04 PM    Final     HISTORICAL MICRO/IMAGING  Assessment/Plan:  38yo M with alport syndrome with ESRD on HD, through temp line who was admitted for rigors and ongoing klebsiella bacteremia - suspect that his line was seeded despite initially having 10 day course of IV therapy..  - had line removed today and now has line holiday - will repeat blood cx today to ensure clearance of bacteremia - due to prolonged bacteremia, recommend TEE since TTE unrevealing  Cdifficile diarrhea = likely from his current exposure to abtx. Continue with vancomycin  125 g QID x 10-d  Anemia = appears to have < 7 and fatigue. May need rbc transfusion is still persistently low, defer to primary team for management  Continue on enteric precautions for cdiff  evaluation of this patient requires complex antimicrobial therapy evaluation and counseling and isolation needs for disease transmission risk assessment and mitigation.   I have personally spent 80 minutes involved in face-to-face and non-face-to-face activities for this patient on the day  of the visit. Professional time spent includes the following activities: Preparing to see the patient (review of tests), Obtaining and/or reviewing separately obtained history (admission/discharge record), Performing a medically appropriate examination and/or evaluation , Ordering medications/tests/procedures, referring and communicating with other health care professionals, Documenting clinical information in the EMR, Independently interpreting results (not separately reported), Communicating results to the patient, Counseling and educating the patient

## 2024-03-20 NOTE — Assessment & Plan Note (Addendum)
 Ongoing watery diarrhea and with multiple abx - C diff positive - Enteric precautions in place - Vanc per pharm

## 2024-03-20 NOTE — Assessment & Plan Note (Addendum)
 BP improved to 136/63 - Continuing home meds: Carvedilol  25 mg BID, losartan  100 mg daily, nifedipine  90 mg BID, spironolactone  100 mg daily, minoxidil  2.5 mg BID - Nephrology recommended hydralazine , to reduce pill burden BiDil 20-37.5 mg started, home med isosorbide  dinitrate discontinued - Monitor closely

## 2024-03-20 NOTE — Assessment & Plan Note (Addendum)
 Per ID, recommend good back/spine exam to consider MRI following if any concern for spinal infection.  Spinal exam today negative for any concerns, only positive for lipoma on right posterior shoulder.  - ID consulted, appreciate recommendations - Nephrology consulted, appreciate recommendations - Cath removed 11/9 - Urine culture ordered from catheter tip - Blood culture ordered per Nephro and ID recs s/p cath removal to show clearance  - Hold Heparin  pending 1500 CBC - TTE ordered per ID recs - Abx: IV Cefepime  1g daily (11/7- ) - Pain: Tylenol  1000 mg q6h PRN - Labs: AM RFP, CBC

## 2024-03-20 NOTE — Plan of Care (Signed)
  Problem: Education: Goal: Knowledge of General Education information will improve Description: Including pain rating scale, medication(s)/side effects and non-pharmacologic comfort measures Outcome: Progressing   Problem: Health Behavior/Discharge Planning: Goal: Ability to manage health-related needs will improve Outcome: Progressing   Problem: Clinical Measurements: Goal: Ability to maintain clinical measurements within normal limits will improve Outcome: Progressing Goal: Will remain free from infection Outcome: Progressing Goal: Diagnostic test results will improve Outcome: Progressing Goal: Respiratory complications will improve Outcome: Progressing Goal: Cardiovascular complication will be avoided Outcome: Progressing   Problem: Nutrition: Goal: Adequate nutrition will be maintained Outcome: Progressing   Problem: Coping: Goal: Level of anxiety will decrease Outcome: Progressing   HD cath out. Also discussed c. Diff, anemia, and bacteremia. Receptive. Happy now that eating. Aware to follow fluid restriction since no HD until Tuesday.

## 2024-03-20 NOTE — Assessment & Plan Note (Addendum)
 HD MWF. - Nephrology following, appreciate recommendations - Renvela  1600 mg TID with meals - Cinacalcet  30 mg MWF

## 2024-03-21 ENCOUNTER — Inpatient Hospital Stay (HOSPITAL_COMMUNITY)

## 2024-03-21 ENCOUNTER — Telehealth (HOSPITAL_COMMUNITY): Payer: Self-pay | Admitting: Pharmacy Technician

## 2024-03-21 ENCOUNTER — Other Ambulatory Visit (HOSPITAL_COMMUNITY): Payer: Self-pay

## 2024-03-21 ENCOUNTER — Encounter (HOSPITAL_COMMUNITY): Payer: Self-pay | Admitting: Family Medicine

## 2024-03-21 DIAGNOSIS — R7881 Bacteremia: Secondary | ICD-10-CM | POA: Diagnosis not present

## 2024-03-21 DIAGNOSIS — N186 End stage renal disease: Secondary | ICD-10-CM | POA: Diagnosis not present

## 2024-03-21 DIAGNOSIS — D631 Anemia in chronic kidney disease: Secondary | ICD-10-CM | POA: Diagnosis not present

## 2024-03-21 DIAGNOSIS — A0472 Enterocolitis due to Clostridium difficile, not specified as recurrent: Secondary | ICD-10-CM | POA: Insufficient documentation

## 2024-03-21 DIAGNOSIS — N189 Chronic kidney disease, unspecified: Secondary | ICD-10-CM | POA: Diagnosis not present

## 2024-03-21 HISTORY — PX: IR REMOVAL TUN CV CATH W/O FL: IMG2289

## 2024-03-21 LAB — CBC
HCT: 24.5 % — ABNORMAL LOW (ref 39.0–52.0)
Hemoglobin: 7.9 g/dL — ABNORMAL LOW (ref 13.0–17.0)
MCH: 26.4 pg (ref 26.0–34.0)
MCHC: 32.2 g/dL (ref 30.0–36.0)
MCV: 81.9 fL (ref 80.0–100.0)
Platelets: 262 K/uL (ref 150–400)
RBC: 2.99 MIL/uL — ABNORMAL LOW (ref 4.22–5.81)
RDW: 17.7 % — ABNORMAL HIGH (ref 11.5–15.5)
WBC: 6 K/uL (ref 4.0–10.5)
nRBC: 0 % (ref 0.0–0.2)

## 2024-03-21 LAB — TYPE AND SCREEN
ABO/RH(D): O POS
Antibody Screen: NEGATIVE
Unit division: 0

## 2024-03-21 LAB — RENAL FUNCTION PANEL
Albumin: 2.5 g/dL — ABNORMAL LOW (ref 3.5–5.0)
Anion gap: 18 — ABNORMAL HIGH (ref 5–15)
BUN: 61 mg/dL — ABNORMAL HIGH (ref 6–20)
CO2: 20 mmol/L — ABNORMAL LOW (ref 22–32)
Calcium: 8.5 mg/dL — ABNORMAL LOW (ref 8.9–10.3)
Chloride: 98 mmol/L (ref 98–111)
Creatinine, Ser: 13.88 mg/dL — ABNORMAL HIGH (ref 0.61–1.24)
GFR, Estimated: 4 mL/min — ABNORMAL LOW (ref >60)
Glucose, Bld: 107 mg/dL — ABNORMAL HIGH (ref 70–99)
Phosphorus: 6.7 mg/dL — ABNORMAL HIGH (ref 2.5–4.6)
Potassium: 4.7 mmol/L (ref 3.5–5.1)
Sodium: 136 mmol/L (ref 135–145)

## 2024-03-21 LAB — GLUCOSE, CAPILLARY
Glucose-Capillary: 88 mg/dL (ref 70–99)
Glucose-Capillary: 79 mg/dL (ref 70–99)
Glucose-Capillary: 81 mg/dL (ref 70–99)

## 2024-03-21 LAB — BPAM RBC
Blood Product Expiration Date: 202512032359
ISSUE DATE / TIME: 202511091828
Unit Type and Rh: 5100

## 2024-03-21 LAB — TROPONIN I (HIGH SENSITIVITY)
Troponin I (High Sensitivity): 26 ng/L — ABNORMAL HIGH (ref <18)
Troponin I (High Sensitivity): 25 ng/L — ABNORMAL HIGH (ref <18)

## 2024-03-21 LAB — HEMOGLOBIN AND HEMATOCRIT, BLOOD
HCT: 22.9 % — ABNORMAL LOW (ref 39.0–52.0)
Hemoglobin: 7.3 g/dL — ABNORMAL LOW (ref 13.0–17.0)

## 2024-03-21 MED ORDER — ONDANSETRON HCL 4 MG/2ML IJ SOLN
4.0000 mg | Freq: Once | INTRAMUSCULAR | Status: AC
  Administered 2024-03-21: 4 mg via INTRAVENOUS
  Filled 2024-03-21: qty 2

## 2024-03-21 MED ORDER — OXYCODONE HCL 5 MG PO TABS
5.0000 mg | ORAL_TABLET | Freq: Four times a day (QID) | ORAL | Status: DC | PRN
  Administered 2024-03-24: 5 mg via ORAL
  Filled 2024-03-21: qty 1

## 2024-03-21 MED ORDER — FAMOTIDINE 20 MG PO TABS: 20.0000 mg | ORAL_TABLET | Freq: Every day | ORAL | Status: DC

## 2024-03-21 MED ORDER — HYDROMORPHONE HCL 1 MG/ML IJ SOLN
0.5000 mg | Freq: Once | INTRAMUSCULAR | Status: AC
  Administered 2024-03-21: 0.5 mg via INTRAVENOUS
  Filled 2024-03-21: qty 0.5

## 2024-03-21 MED ORDER — CEFAZOLIN SODIUM-DEXTROSE 2-4 GM/100ML-% IV SOLN: 2.0000 g | Freq: Once | INTRAVENOUS | Status: DC

## 2024-03-21 MED ORDER — OXYCODONE HCL 5 MG PO TABS
10.0000 mg | ORAL_TABLET | Freq: Four times a day (QID) | ORAL | Status: DC | PRN
  Administered 2024-03-21 – 2024-03-24 (×6): 10 mg via ORAL
  Filled 2024-03-21 (×7): qty 2

## 2024-03-21 MED ORDER — FAMOTIDINE 20 MG PO TABS
10.0000 mg | ORAL_TABLET | Freq: Every day | ORAL | Status: DC
  Administered 2024-03-21 – 2024-03-24 (×4): 10 mg via ORAL
  Filled 2024-03-21 (×4): qty 1

## 2024-03-21 NOTE — Assessment & Plan Note (Addendum)
 HD MWF. Hgb 6.9 yesterday improved to 7.39 today s/p 1u RBCs. Next Cath placement and HD 11/11. This AM chest pain from port radiating to chest and upper arm, f/u with troponins and EKG to rule out ACS - Nephrology following, appreciate recommendations - Cath/TDC placement 11/11 per Nephro - HD 11/11  - Troponin 1 26 at 0900, pending f/u Troponin 1 - Oxycodone  5-10mg  q6 prn breakthrough pain from catheter removal site - Renvela  1600 mg TID with meals - Cinacalcet  30 mg MWF - Micera 150 mcg q2 weeks (last 11/3) - Monitor hemodynamic status closely  - Transfusion threshold 7, transfuse prn

## 2024-03-21 NOTE — Progress Notes (Signed)
 [facility] Progress Note   Subjective:   Seen in room. Dialysis catheter removed yesterday. Per patient catheter was difficult to remove. Endorses some soreness around catheter site up to R neck/jaw.   Objective Vitals:   03/20/24 2240 03/21/24 0553 03/21/24 0736 03/21/24 0800  BP: (!) 189/78 (!) 202/107 (!) 198/108 (!) 198/108  Pulse: 87 80 79   Resp: 18 18 18    Temp: 98.3 F (36.8 C) 98.1 F (36.7 C) 97.9 F (36.6 C)   TempSrc: Oral Oral Oral   SpO2: 100% 100% 100%   Weight:      Height:         Additional Objective Labs: Basic Metabolic Panel: Recent Labs  Lab 03/19/24 0436 03/20/24 0255 03/21/24 0110  NA 136 132* 136  K 3.8 4.1 4.7  CL 95* 93* 98  CO2 23 21* 20*  GLUCOSE 84 69* 107*  BUN 39* 50* 61*  CREATININE 10.85* 12.41* 13.88*  CALCIUM  8.3* 8.2* 8.5*  PHOS 5.4* 5.6* 6.7*   CBC: Recent Labs  Lab 03/18/24 2150 03/19/24 0436 03/20/24 0255 03/20/24 0700 03/20/24 1536 03/21/24 0110 03/21/24 0447  WBC 9.7 7.7 5.7  --  5.5  --  6.0  HGB 7.2* 7.5* 6.2*   < > 6.9* 7.3* 7.9*  HCT 22.6* 23.3* 19.8*   < > 22.3* 22.9* 24.5*  MCV 82.2 81.2 82.2  --  83.2  --  81.9  PLT 297 269 248  --  278  --  262   < > = values in this interval not displayed.   Blood Culture    Component Value Date/Time   SDES BLOOD RIGHT HAND 03/20/2024 1352   SDES BLOOD RIGHT HAND 03/20/2024 1352   SPECREQUEST  03/20/2024 1352    BOTTLES DRAWN AEROBIC AND ANAEROBIC Blood Culture results may not be optimal due to an inadequate volume of blood received in culture bottles   SPECREQUEST  03/20/2024 1352    BOTTLES DRAWN AEROBIC AND ANAEROBIC Blood Culture results may not be optimal due to an inadequate volume of blood received in culture bottles   CULT  03/20/2024 1352    NO GROWTH < 24 HOURS Performed at [facility], [address], [place], [place] [address]    CULT  03/20/2024 1352    NO GROWTH < 24 HOURS Performed at [facility], [address] [place], [place] [address]    REPTSTATUS PENDING 03/20/2024 1352   REPTSTATUS PENDING 03/20/2024 1352    Physical Exam General: Alert, nad  Heart: RRR Lungs: Clear, normal wob Abdomen: non -tender Extremities: no LE edema  Dialysis Access: TDC; LUE AVG +bruit   Medications:  ceFEPime  (MAXIPIME ) IV Stopped (03/20/24 1531)    calcitRIOL   1 mcg Oral Q M,W,F-HD   carvedilol   25 mg Oral BID   Chlorhexidine  Gluconate Cloth  6 each Topical Q0600   cinacalcet   30 mg Oral Q M,W,F-1800   divalproex   500 mg Oral Daily   heparin   5,000 Units Subcutaneous Q8H   isosorbide -hydrALAZINE   1 tablet Oral TID   losartan   100 mg Oral Daily   minoxidil   2.5 mg Oral BID   NIFEdipine   90 mg Oral BID   pantoprazole   40 mg Oral Daily   sevelamer  carbonate  1,600 mg Oral TID WC   spironolactone   100 mg Oral Daily   vancomycin   125 mg Oral QID    Dialysis Orders:  NW MWFSat  4:00 Nipro EDW 72.5kg 2K/2Ca  AVG/ TDC  -No bolus heparin  order -Mircera 150 mcg q 2 weeks (last 11/3) -Calcitriol  1.0 q HD; Sensipar  30 q HD, sevelamer  800 2 q meals  Last HD 11/5 -- post HD wt 77.4 kg    Assessment/Plan: Klebsiella bacteremia. Blood Cx 11/3 from OP HD unit  +Klebsiella oxytoca.  He was treated for Klebsiella bacteremia in Oct and completed course of cefepime . BCx 11/7 NGTD, Repeat BCx 11/9 NGTD. On cefepime . No vegetation on TTE. ID recommends TEE.  Catheter removed for line holiday on 11/9. Plan for new catheter 11/11.  ESRD. Now 4x/week HD. MWF schedule here. Had HD Fri. Catheter removal as above for line holiday.  Next HD likely Tues  after catheter replaced.  Access. Using [facility].  L arm AVG placed at [facility] 11/2023. Graft patent on f'gram in Sept. Have been unable to cannulate graft successfully to date. Appreciate VVS input - no need for another shuntogram. Will most likely need new perm access. F/u as outpatient.  Hypertension. Severe. Home meds resumed. Follow.  Volume. Weights up. UF to EDW as able.   Anemia. Hgb 7.9 s/p 1 u prbcs 11/9. ESA recently dosed as outpatient. Follow trends.  Metabolic bone disease.  Ca/Phos acceptable. Continue home meds.  Nutrition. On regular diet with fluid restriction. Counseled on low K diet choices.  C.diff. On PO Vanc.   Edward Ronnald Acosta PA-C [facility] 03/21/2024,10:40 AM

## 2024-03-21 NOTE — Plan of Care (Signed)

## 2024-03-21 NOTE — Assessment & Plan Note (Addendum)
 Ongoing watery diarrhea and with multiple abx and Protonix  - C diff positive - Enteric precautions in place - Vancomycin  125mg  daily per pharm (11/9-) - F/u with patient on severity of GERD since PPI increases risk of Cdiff, consider switching

## 2024-03-21 NOTE — Assessment & Plan Note (Addendum)
 Alport syndrome: Bilateral sensorineural hearing loss Anemia of Chronic Disease: S/p ESA outpatient 11/3. Continue to optimize with nephrology. Hgb of 7.5 this am, stable from 7.2 yesterday. Bipolar 1 disorder: Continue Depakote  500 mg daily Migraines: Continue Depakote  500 mg daily GERD: Continue Protonix  40 mg daily HFpEF: EF 60 to 65%, no RVMA, mildly dilated pulmonary artery with normal PAP

## 2024-03-21 NOTE — Progress Notes (Signed)
 Daily Progress Note Intern Pager: [phone]  Patient name: [name] Medical record number: [id number] Date of birth: [birth year] Age: 38 y.o. Gender: male  Primary Care Provider: Lonnie Earnest, MD Consultants: Nephrology, IR, ID Code Status: Full  Pt Overview and Major Events to Date:  11/7: Admitted to FMTS 11/9: Dialysis cath removed  [name] is a 38 year old male with a past medical history of Alport syndrome bilateral hearing loss, HTN, bipolar disorder, HF, ESRD on HD MWF.  Admitted for chills with positive BCx from outpatient HD (collected 11/3) showing Klebsiella oxytoca.  Suspected source of infection from HD catheter.  S/p catheter removal with blood culture showing no growth, awaiting urine culture.  Plans are for catheter replacement, HD, and TEE tomorrow. Edward & Plan Klebsiella Bacteremia Per ID, recommend good back/spine exam to consider MRI following if any concern for spinal infection. Blood Cx showed s/p cath removal showed no growth - ID consulted, appreciate recommendations - TEE ordered for 11/11 per ID recs - Urine culture ordered from catheter tip - Hold Heparin  pending 1500 CBC - NPO at midnight - Abx: IV Cefepime  1g daily (11/7- ) - Pain: Tylenol  1000 mg q6h PRN - Labs: AM RFP, CBC End-stage renal disease on hemodialysis (HCC) Anemia associated with chronic renal failure HD MWF. Hgb 6.9 yesterday improved to 7.39 today s/p 1u RBCs. Next Cath placement and HD 11/11. 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 difficile diarrhea Ongoing watery diarrhea and with multiple abx and Protonix  - C diff positive - Enteric precautions in place - Vancomycin  125mg  daily per pharm (11/9-) - F/u with patient on severity of GERD since PPI increases risk of Cdiff, consider switching  Mass of skin of right shoulder No concern for infection at this time given exam and patient's report. - Will have patient follow-up outpatient for further management Chronic health problem Alport syndrome: Bilateral sensorineural hearing loss Anemia of Chronic Disease: S/p ESA outpatient 11/3. Continue to optimize with nephrology. Hgb of 7.5 this am, stable from 7.2 yesterday. Bipolar 1 disorder: Continue Depakote  500 mg daily Migraines: Continue Depakote  500 mg daily GERD: Continue Protonix  40 mg daily  HFpEF: EF 60 to 65%, no RVMA, mildly dilated pulmonary artery with normal PAP   FEN/GI: Regular Diet PPx: Heparin  Dispo:Med-Surg  Subjective:  Found on toilet having loose stools.  States his catheter removal site is feeling better on the new payment of management.  Objective: Temp:  [97.4 F (36.3 C)-98.3 F (36.8 C)] 98.1 F (36.7 C) (11/10 0553) Pulse Rate:  [80-87] 80 (11/10 0553) Resp:  [16-19] 18 (11/10 0553) BP: (136-202)/(62-118) 202/107 (11/10 0553) SpO2:  [99 %-100 %] 100 % (11/10 0553)  Physical  Exam: General: Well-appearing, no acute distress, pain 2/10 Cardio: Regular rate, regular rhythm, no murmurs on exam. Pulm: Clear, no wheezing, no crackles. No increased work of breathing Abdominal: bowel sounds present, soft,  non-tender, non-distended Extremities: no peripheral edema, bruit at site of port on L shoulder, bandage with no erythema, warmth, or drainage  Laboratory: Most recent CBC Lab Results  Component Value Date   WBC 6.0 03/21/2024   HGB 7.9 (L) 03/21/2024   HCT 24.5 (L) 03/21/2024   MCV 81.9 03/21/2024   PLT 262 03/21/2024   Most recent BMP    Latest Ref Rng & Units 03/21/2024    1:10 AM  BMP  Glucose 70 - 99 mg/dL 892   BUN 6 - 20 mg/dL 61   Creatinine 9.38 - 1.24 mg/dL 86.11   Sodium 864 - 854 mmol/L 136   Potassium 3.5 - 5.1 mmol/L 4.7   Chloride 98 - 111 mmol/L 98   CO2 22 - 32 mmol/L 20   Calcium  8.9 - 10.3 mg/dL 8.5     Imaging/Diagnostic Tests: Echo: LVEF: 60 to 65%.  LV no regional wall motion abnormalities, moderate LV hypertrophy.  Left atrial size moderately dilated, right atrial size moderately dilated.  Tricuspid valve is abnormal with moderate regurgitation.  IVC is dilated in size with greater than 50% respiratory variability, suggesting right atrial pressure of 8 mmHg  [name], MD 03/21/2024, 7:21 AM  PGY-1, [facility] FPTS Intern pager: [phone], text pages welcome Secure chat group [facility] Teaching Service

## 2024-03-21 NOTE — Consult Note (Signed)
 Chief Complaint: TDC placement after line holiday for bacteremia.  Referring Physician(s): Dr. Maisie Acosta  Supervising Physician: Jenna Hacker  Patient Status: [facility] - In-pt  History of Present Illness: [name] is a 38 y.o. male 38 y.o male inpatient. History of anemia, HTN, CHF, GERD, pericardial effusion, hard of hearing.ESRD on HD via RIJ TDC placed at OSH. (Team has been unable to cannulate L arm AVG).  Recently treated for klebsiella bacteremia on outpatient setting. Presented to the ED at [facility] on 11.7.25 with diarrhea and abnormal labs. RIJ TDC removed on 11.9.25 for line holiday. Team is requesting tunneled HD catheter placement for ongoing dialysis access. Tentatively scheduled for 11.11.25 pending patient remains free of infection.  Patient sitting up in bed. Endorses acid reflux and pain tot he right side of his neck that radiates to his ear since [facility] removal.  Patient alert and laying in bed,calm. Denies any fevers, headache, chest pain, SOB, cough, abdominal pain, nausea, vomiting or bleeding.   No leukocytosis. Blood cultures negative X 1 days. Hgb 7.9, BUN 61, Cr 13.88, GFR < 4.    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   Heart murmur    Hypertension    Low blood sugar    Marijuana abuse    Noncompliance    Pericardial effusion 03/28/2023   Tobacco abuse     Past Surgical History:  Procedure Laterality Date   A/V FISTULAGRAM Right 10/27/2022   Procedure: A/V Fistulagram;  Surgeon: Eliza Lonni RAMAN, MD;  Location: [facility] INVASIVE CV LAB;  Service: Cardiovascular;  Laterality: Right;   A/V FISTULAGRAM Left 01/27/2024   Procedure: A/V  Fistulagram;  Surgeon: Lanis Fonda BRAVO, MD;  Location: [facility] INVASIVE CV LAB;  Service: Cardiovascular;  Laterality: Left;   APPENDECTOMY     AV FISTULA PLACEMENT Left 03/03/2019   Procedure: ARTERIOVENOUS (AV) FISTULA CREATION LEFT ARM;  Surgeon: Sheree Penne Lonni, MD;  Location: [facility] OR;  Service: Vascular;  Laterality: Left;   BASCILIC VEIN TRANSPOSITION Right 04/12/2019   Procedure: RIGHT UPPER EXTREMITY BASCILIC VEIN TRANSPOSITION FIRST STAGE FISTULA;  Surgeon: Eliza Lonni RAMAN, MD;  Location: [facility] OR;  Service: Vascular;  Laterality: Right;   BIOPSY  10/14/2021   Procedure: BIOPSY;  Surgeon: Legrand Victory LITTIE DOUGLAS, MD;  Location: [facility] ENDOSCOPY;  Service: Gastroenterology;;   BIOPSY  06/06/2022   Procedure: BIOPSY;  Surgeon: Shila Gustav GAILS, MD;  Location: [facility] ENDOSCOPY;  Service: Gastroenterology;;   BIOPSY  11/12/2022   Procedure: BIOPSY;  Surgeon: Wilhelmenia Aloha Raddle., MD;  Location: [facility] ENDOSCOPY;  Service: Gastroenterology;;   BIOPSY  12/05/2022   Procedure: BIOPSY;  Surgeon: Stacia Glendia BRAVO, MD;  Location: [facility] ENDOSCOPY;  Service: Gastroenterology;;   COLONOSCOPY WITH PROPOFOL  N/A 10/14/2021   Procedure: COLONOSCOPY WITH PROPOFOL ;  Surgeon: Legrand Victory LITTIE DOUGLAS, MD;  Location: [facility] ENDOSCOPY;  Service: Gastroenterology;  Laterality: N/A;   DIALYSIS/PERMA CATHETER INSERTION N/A 04/03/2023   Procedure: DIALYSIS/PERMA CATHETER INSERTION;  Surgeon: Magda Debby SAILOR, MD;  Location: [facility] INVASIVE CV LAB;  Service: Cardiovascular;  Laterality: N/A;   ESOPHAGOGASTRODUODENOSCOPY  N/A 11/12/2022   Procedure: ESOPHAGOGASTRODUODENOSCOPY (EGD);  Surgeon: Wilhelmenia Aloha Raddle., MD;  Location: [facility] ENDOSCOPY;  Service: Gastroenterology;  Laterality: N/A;   ESOPHAGOGASTRODUODENOSCOPY N/A 12/05/2022   Procedure: ESOPHAGOGASTRODUODENOSCOPY (EGD);  Surgeon: Stacia Glendia BRAVO, MD;  Location: [facility] ENDOSCOPY;  Service: Gastroenterology;  Laterality: N/A;   ESOPHAGOGASTRODUODENOSCOPY (EGD) WITH PROPOFOL  N/A  06/06/2022   Procedure: ESOPHAGOGASTRODUODENOSCOPY (EGD) WITH PROPOFOL ;  Surgeon: Shila Gustav GAILS, MD;  Location: [facility] ENDOSCOPY;  Service: Gastroenterology;  Laterality: N/A;   FISTULA SUPERFICIALIZATION Right 11/07/2022   Procedure: PLICATION OF LARGE ANEURYSMS OF RIGHT ARTERIOVENOUS FISTULA WITH INSERTION OF 7mm INTERPOSITIONAL GORTEX GRAFT;  Surgeon: Eliza Lonni RAMAN, MD;  Location: [facility] OR;  Service: Vascular;  Laterality: Right;   FLEXIBLE SIGMOIDOSCOPY N/A 04/14/2022   Procedure: FLEXIBLE SIGMOIDOSCOPY;  Surgeon: Shila Gustav GAILS, MD;  Location: [facility] ENDOSCOPY;  Service: Gastroenterology;  Laterality: N/A;   INSERTION OF DIALYSIS CATHETER N/A 11/07/2022   Procedure: INSERTION OF RIGHT INTERNAL JUGULAR TUNNELED DIALYSIS CATHETER;  Surgeon: Eliza Lonni RAMAN, MD;  Location: [facility] OR;  Service: Vascular;  Laterality: N/A;   LIGATION OF ARTERIOVENOUS  FISTULA Left 03/06/2019   Procedure: LIGATION OF ARTERIOVENOUS  FISTULA;  Surgeon: Gretta Lonni PARAS, MD;  Location: [facility] OR;  Service: Vascular;  Laterality: Left;   PERIPHERAL VASCULAR BALLOON ANGIOPLASTY  10/27/2022   Procedure: PERIPHERAL VASCULAR BALLOON ANGIOPLASTY;  Surgeon: Eliza Lonni RAMAN, MD;  Location: [facility] INVASIVE CV LAB;  Service: Cardiovascular;;  rt upper arm fistula   SAVORY DILATION N/A 11/12/2022   Procedure: SAVORY DILATION;  Surgeon: Wilhelmenia Aloha Raddle., MD;  Location: [facility] ENDOSCOPY;  Service: Gastroenterology;  Laterality: N/A;   spinal tap     SPINE SURGERY     related to a spinal infection, unsure of surgery or infection source   TRANSESOPHAGEAL ECHOCARDIOGRAM (CATH LAB) N/A 04/02/2023   Procedure: TRANSESOPHAGEAL ECHOCARDIOGRAM;  Surgeon: Jeffrie Oneil BROCKS, MD;  Location: [facility] INVASIVE CV LAB;  Service: Cardiovascular;  Laterality: N/A;   WISDOM TOOTH EXTRACTION      Allergies: Nsaids, Zestril [lisinopril], and Risperdal  [risperidone ]  Medications: Prior to Admission medications   Medication Sig Start Date  End Date Taking? Authorizing Provider  acetaminophen  (TYLENOL ) 500 MG tablet Take 1 tablet (500 mg total) by mouth every 6 (six) hours as needed for mild pain (pain score 1-3) or fever. 02/10/24   Will Almarie MATSU, MD  carvedilol  (COREG ) 25 MG tablet Take 1 tablet (25 mg total) by mouth 2 (two) times daily with a meal. 02/10/24   Will Almarie MATSU, MD  Cholecalciferol 1.25 MG (50000 UT) TABS Take 1 tablet by mouth once a week. For 8 weeks 02/23/24   Baloch, Mahnoor, MD  cinacalcet  (SENSIPAR ) 30 MG tablet Take 1 tablet (30 mg total) by mouth every Monday, Wednesday, and Friday at 6 PM. 02/10/24   Will Almarie MATSU, MD  clotrimazole  (LOTRIMIN ) 1 % cream Apply topically 2 (two) times daily. 02/10/24   Will Almarie MATSU, MD  divalproex  (DEPAKOTE  ER) 500 MG 24 hr tablet Take 1 tablet (500 mg total) by mouth daily. 04/04/23   Joshua Domino, DO  isosorbide  mononitrate (ISMO ) 20 MG tablet Take 1 tablet (20 mg total) by mouth 2 (two) times daily. 02/10/24   Will Almarie MATSU, MD  losartan  (COZAAR ) 100 MG tablet Take 100 mg by mouth daily. 11/11/23   [provider]  minoxidil  (LONITEN ) 2.5 MG tablet Take 1 tablet (2.5 mg total) by mouth 2 (two) times daily. 02/10/24   Will Almarie MATSU, MD  NIFEdipine  (ADALAT   CC) 90 MG 24 hr tablet Take 90 mg by mouth in the morning and at bedtime.    [provider]  pantoprazole  (PROTONIX ) 40 MG tablet Take 1 tablet (40 mg total) by mouth daily. 12/28/23   Babcock, Peter E, NP  sevelamer  carbonate (RENVELA ) 800 MG tablet Take 2 tablets (1,600 mg total) by mouth 3 (three) times daily with meals. 05/14/23   Cleotilde Lukes, DO  spironolactone  (ALDACTONE ) 100 MG tablet Take 1 tablet (100 mg total) by mouth daily. 02/11/24   Will Almarie MATSU, MD     Family History  Problem Relation Age of Onset   Hypertension Mother    Kidney failure Mother    Diabetes Mother    Hearing loss Father    Hypertension Sister    Heart disease Maternal Grandmother     Stroke Maternal Grandfather    Asthma Son     Social History   Socioeconomic History   Marital status: Married    Spouse name: Not on file   Number of children: 3   Years of education: Not on file   Highest education level: Not on file  Occupational History   Occupation: HVAC  Tobacco Use   Smoking status: Every Day    Current packs/day: 0.00    Types: Cigarettes    Last attempt to quit: 02/27/2016    Years since quitting: 8.0   Smokeless tobacco: Never   Tobacco comments:    Returned to smoking 2 black and milds per day  Vaping Use   Vaping status: Never Used  Substance and Sexual Activity   Alcohol use: Not Currently    Comment: rare   Drug use: Yes    Types: Marijuana   Sexual activity: Not Currently  Other Topics Concern   Not on file  Social History Narrative   Not on file   Social Drivers of Health   Financial Resource Strain: High Risk (03/15/2024)   Overall Financial Resource Strain (CARDIA)    Difficulty of Paying Living Expenses: Very hard  Food Insecurity: Food Insecurity Present (03/19/2024)   Hunger Vital Sign    Worried About Running Out of Food in the Last Year: Often true    Ran Out of Food in the Last Year: Sometimes true  Transportation Needs: Patient Declined (03/19/2024)   PRAPARE - Transportation    Lack of Transportation (Medical): Patient declined    Lack of Transportation (Non-Medical): Patient declined  Physical Activity: Inactive (06/02/2023)   Exercise Vital Sign    Days of Exercise per Week: 0 days    Minutes of Exercise per Session: 0 min  Stress: Stress Concern Present (03/15/2024)   [employer] of Occupational Health - Occupational Stress Questionnaire    Feeling of Stress: To some extent  Social Connections: Moderately Isolated (03/19/2024)   Social Connection and Isolation Panel    Frequency of Communication with Friends and Family: More than three times a week    Frequency of Social Gatherings with Friends and Family:  More than three times a week    Attends Religious Services: More than 4 times per year    Active Member of [employer] or Organizations: No    Attends Banker Meetings: Never    Marital Status: Divorced      Review of Systems: A 12 point ROS discussed and pertinent positives are indicated in the HPI above.  All other systems are negative.  Review of Systems  Constitutional:  Negative for fever.  HENT:  Positive  for sore throat (right sided neck pain radiating to his ear since [facility] removal). Negative for congestion.   Respiratory:  Negative for cough and shortness of breath.   Cardiovascular:  Negative for chest pain.  Gastrointestinal:  Negative for abdominal pain (decribes as acid reflux).  Neurological:  Negative for headaches.  Psychiatric/Behavioral:  Negative for behavioral problems and confusion.     Vital Signs: BP (!) 169/86 (BP Location: Left Leg)   Pulse 82   Temp (!) 97.5 F (36.4 C) (Oral)   Resp 18   Ht 5' 8 (1.727 m)   Wt 167 lb 8.8 oz (76 kg)   SpO2 98%   BMI 25.48 kg/m     Physical Exam Vitals and nursing note reviewed.  Constitutional:      Appearance: He is well-developed.  HENT:     Head: Normocephalic.  Cardiovascular:     Rate and Rhythm: Regular rhythm.  Pulmonary:     Effort: Pulmonary effort is normal.  Musculoskeletal:        General: Normal range of motion.     Cervical back: Normal range of motion.  Skin:    General: Skin is warm and dry.  Neurological:     General: No focal deficit present.     Mental Status: [name] and oriented to person, place, and time. Mental status is at baseline.  Psychiatric:        Mood and Affect: Mood normal.        Behavior: Behavior normal.        Thought Content: Thought content normal.        Judgment: Judgment normal.     Imaging: ECHOCARDIOGRAM COMPLETE Result Date: 03/20/2024    ECHOCARDIOGRAM REPORT   Patient Name:   [name] Date of Exam: 03/19/2024 Medical Rec #:   [id number]       Height:       68.0 in Accession #:    [id number]      Weight:       167.5 lb Date of Birth:  05/29/1985       BSA:          1.896 m Patient Age:    38 years        BP:           187/111 mmHg Patient Gender: M               HR:           84 bpm. Exam Location:  Inpatient Procedure: 2D Echo, Cardiac Doppler and Color Doppler (Both Spectral and Color            Flow Doppler were utilized during procedure). Indications:    Bacteremia R78.81  History:        Patient has prior history of Echocardiogram examinations, most                 recent 01/30/2024. CHF, Migraines; Risk Factors:Hypertension and                 Current Smoker.  Sonographer:    Thea Norlander RCS Referring Phys: [facility] IMPRESSIONS  1. Left ventricular ejection fraction, by estimation, is 60 to 65%. The left ventricle has normal function. The left ventricle has no regional wall motion abnormalities. There is moderate left ventricular hypertrophy. Left ventricular diastolic parameters are indeterminate.  2. Right ventricular systolic function is normal. The right ventricular size is normal. There is mildly elevated pulmonary artery  systolic pressure.  3. Left atrial size was moderately dilated.  4. Right atrial size was moderately dilated.  5. The mitral valve is normal in structure. Trivial mitral valve regurgitation. No evidence of mitral stenosis.  6. The tricuspid valve is abnormal. Tricuspid valve regurgitation is moderate.  7. The aortic valve is tricuspid. Aortic valve regurgitation is not visualized. No aortic stenosis is present.  8. The inferior vena cava is dilated in size with >50% respiratory variability, suggesting right atrial pressure of 8 mmHg. FINDINGS  Left Ventricle: Left ventricular ejection fraction, by estimation, is 60 to 65%. The left ventricle has normal function. The left ventricle has no regional wall motion abnormalities. The left ventricular internal cavity size was normal in size. There is   moderate left ventricular hypertrophy. Left ventricular diastolic parameters are indeterminate. Right Ventricle: The right ventricular size is normal. Right vetricular wall thickness was not well visualized. Right ventricular systolic function is normal. There is mildly elevated pulmonary artery systolic pressure. The tricuspid regurgitant velocity  is 2.74 m/s, and with an assumed right atrial pressure of 8 mmHg, the estimated right ventricular systolic pressure is 38.0 mmHg. Left Atrium: Left atrial size was moderately dilated. Right Atrium: Right atrial size was moderately dilated. Pericardium: There is no evidence of pericardial effusion. Mitral Valve: The mitral valve is normal in structure. Trivial mitral valve regurgitation. No evidence of mitral valve stenosis. Tricuspid Valve: The tricuspid valve is abnormal. Tricuspid valve regurgitation is moderate . No evidence of tricuspid stenosis. Aortic Valve: The aortic valve is tricuspid. Aortic valve regurgitation is not visualized. No aortic stenosis is present. Aortic valve mean gradient measures 8.6 mmHg. Aortic valve peak gradient measures 15.0 mmHg. Aortic valve area, by VTI measures 3.36  cm. Pulmonic Valve: The pulmonic valve was not well visualized. Pulmonic valve regurgitation is mild. No evidence of pulmonic stenosis. Aorta: The aortic root and ascending aorta are structurally normal, with no evidence of dilitation. Venous: The inferior vena cava is dilated in size with greater than 50% respiratory variability, suggesting right atrial pressure of 8 mmHg. IAS/Shunts: No atrial level shunt detected by color flow Doppler.  LEFT VENTRICLE PLAX 2D LVIDd:         5.10 cm   Diastology LVIDs:         3.50 cm   LV e' medial:    6.98 cm/s LV PW:         1.30 cm   LV E/e' medial:  16.8 LV IVS:        1.30 cm   LV e' lateral:   13.10 cm/s LVOT diam:     2.20 cm   LV E/e' lateral: 8.9 LV SV:         113 LV SV Index:   60 LVOT Area:     3.80 cm  RIGHT VENTRICLE             IVC RV S prime:     8.93 cm/s  IVC diam: 2.90 cm TAPSE (M-mode): 1.8 cm LEFT ATRIUM           Index        RIGHT ATRIUM           Index LA diam:      4.70 cm 2.48 cm/m   RA Area:     24.80 cm LA Vol (A2C): 73.1 ml 38.56 ml/m  RA Volume:   79.30 ml  41.83 ml/m LA Vol (A4C): 82.7 ml 43.63 ml/m  AORTIC VALVE AV Area (  Vmax):    3.09 cm AV Area (Vmean):   2.94 cm AV Area (VTI):     3.36 cm AV Vmax:           193.42 cm/s AV Vmean:          138.357 cm/s AV VTI:            0.336 m AV Peak Grad:      15.0 mmHg AV Mean Grad:      8.6 mmHg LVOT Vmax:         157.00 cm/s LVOT Vmean:        107.000 cm/s LVOT VTI:          0.297 m LVOT/AV VTI ratio: 0.88  AORTA Ao Root diam: 3.60 cm Ao Asc diam:  3.00 cm MITRAL VALVE                TRICUSPID VALVE MV Area (PHT): 3.85 cm     TR Peak grad:   30.0 mmHg MV Decel Time: 197 msec     TR Vmax:        274.00 cm/s MV E velocity: 117.00 cm/s MV A velocity: 46.60 cm/s   SHUNTS MV E/A ratio:  2.51         Systemic VTI:  0.30 m                             Systemic Diam: 2.20 cm Dorn Ross MD Electronically signed by Dorn Ross MD Signature Date/Time: 03/20/2024/1:32:04 PM    Final     Labs:  CBC: Recent Labs    03/19/24 0436 03/20/24 0255 03/20/24 0700 03/20/24 1536 03/21/24 0110 03/21/24 0447  WBC 7.7 5.7  --  5.5  --  6.0  HGB 7.5* 6.2* 7.3* 6.9* 7.3* 7.9*  HCT 23.3* 19.8* 23.6* 22.3* 22.9* 24.5*  PLT 269 248  --  278  --  262    COAGS: Recent Labs    03/27/23 1828  INR 1.2  APTT 36    BMP: Recent Labs    03/18/24 0210 03/18/24 2150 03/19/24 0436 03/20/24 0255 03/21/24 0110  NA 136  --  136 132* 136  K 4.1  --  3.8 4.1 4.7  CL 95*  --  95* 93* 98  CO2 21*  --  23 21* 20*  GLUCOSE 94  --  84 69* 107*  BUN 66*  --  39* 50* 61*  CALCIUM  8.7*  --  8.3* 8.2* 8.5*  CREATININE 16.21* 10.32* 10.85* 12.41* 13.88*  GFRNONAA 4* 6* 6* 5* 4*    LIVER FUNCTION TESTS: Recent Labs    08/30/23 2153 12/26/23 2036 01/20/24 2100  01/22/24 0757 03/18/24 0210 03/19/24 0436 03/20/24 0255 03/21/24 0110  BILITOT 0.7 1.0 0.9  --  0.7  --   --   --   AST 23 23 26   --  21  --   --   --   ALT 19 15 13   --  20  --   --   --   ALKPHOS 125 138* 139*  --  164*  --   --   --   PROT 7.1 7.5 6.7  --  7.9  --   --   --   ALBUMIN  3.8 3.6 2.8*   < > 3.1* 2.7* 2.5* 2.5*   < > = values in this interval not displayed.    TUMOR MARKERS: No results for input(s):  AFPTM, CEA, CA199, CHROMGRNA in the last 8760 hours.  Assessment and Plan:  38 y.o male inpatient. History of anemia, HTN, CHF, GERD, pericardial effusion, hard of hearing.ESRD on HD via RIJ TDC placed at OSH. (Team has been unable to cannulate L arm AVG).  Recently treated for klebsiella bacteremia on outpatient setting. Presented to the ED at [facility] on 11.7.25 with diarrhea and abnormal labs. RIJ TDC removed on 11.9.25 for line holiday. Team is requesting tunneled HD catheter placement for ongoing dialysis access.   PLAN: IR Image guided tunneled HD catheter placemen. Tentatively scheduled for 11.11.25 pending patient remains free of infection.    Risks and benefits discussed with the patient including, but not limited to bleeding, infection, vascular injury, pneumothorax which may require chest tube placement, air embolism or even death  All of the patient's questions were answered, patient is agreeable to proceed. Consent signed and in chart.   Thank you for this interesting consult.  I greatly enjoyed meeting Kali L Yono and look forward to participating in their care.  A copy of this report was sent to the requesting provider on this date.  Electronically Signed: Delon JAYSON Beagle, NP 03/21/2024, 3:55 PM   I spent a total of 20 Minutes    in face to face in clinical consultation, greater than 50% of which was counseling/coordinating care for [facility] placement

## 2024-03-21 NOTE — Assessment & Plan Note (Addendum)
 Per ID, recommend good back/spine exam to consider MRI following if any concern for spinal infection. Blood Cx showed s/p cath removal showed no growth - ID consulted, appreciate recommendations - TEE ordered for 11/11 per ID recs - Urine culture ordered from catheter tip - Hold Heparin  pending 1500 CBC - NPO at midnight - Abx: IV Cefepime  1g daily (11/7- ) - Pain: Tylenol  1000 mg q6h PRN - Labs: AM RFP, CBC

## 2024-03-21 NOTE — Plan of Care (Signed)
 FMTS Interim Progress Note  S: Received message from nurse that patient is having left facial pain and pain behind left eyeball.  Patient was seen and examined at bedside by myself.  He states he has been having right and left-sided facial pain intermittently since his dialysis catheter in the right upper chest was removed yesterday.  He was concern for the same last night and was given oxycodone  10 mg and Dilaudid  0.5 mg IV for breakthrough pain.  He is also vomiting after eating a cheeseburger very quickly for lunch and has a history of severe GERD.  He states every time he has to vomit it makes his facial pain worse. Denies CP, vision changes, SOB, aura, or any other complaints at this time.   O: BP (!) 169/86 (BP Location: Left Leg)   Pulse 82   Temp (!) 97.5 F (36.4 C) (Oral)   Resp 18   Ht 5' 8 (1.727 m)   Wt 76 kg   SpO2 98%   BMI 25.48 kg/m   GEN: Well-appearing male sitting upright in hospital bed comfortably in no acute distress HEENT: PERRLA, EOMI, nares clear, moist mucous membranes, no cervical lymphadenopathy, neck is supple with full range of motion, bilateral lower jaw swellings left > right question for parotitis, no overlying skin changes appreciated to face or neck or upper chest skin Neuro: Cranial nerves II through XII intact, bilateral sensation intact, moves all extremities equally, no slurred speech, follows directions and commands, alert and oriented x 4 Psych: appropriate mood and affect   A/P: At this time low suspicion for stroke.  Patient has normal neuroexam and no other clinical signs of stroke and pain was relieved last night with medication.  Most likely secondary to increased pressure from vomiting today. - One dose of IV Zofran  4 mg - Continue Protonix  40 mg daily - Start Pepcid  10 mg renal dosing daily -Continue pain regimen with Tylenol  1000 mg every 6 hours as needed for mild pain, oxycodone  5 mg every 6 hours as needed for moderate pain, oxycodone  10  mg every 6 hours as needed for severe pain; consider 0.5 mg IV Dilaudid  for intractable pain  Lupie Credit, DO 03/21/2024, 4:38 PM PGY-1, [facility] Service pager [phone]

## 2024-03-21 NOTE — Assessment & Plan Note (Signed)
 No concern for infection at this time given exam and patient's report. - Will have patient follow-up outpatient for further management

## 2024-03-21 NOTE — Assessment & Plan Note (Addendum)
 BP 198/108 - Continuing home meds: Carvedilol  25 mg BID, losartan  100 mg daily, nifedipine  90 mg BID, spironolactone  100 mg daily, minoxidil  2.5 mg BID - Nephrology recommended hydralazine , to reduce pill burden BiDil 20-37.5 mg started, home med isosorbide  dinitrate discontinued - Monitor closely

## 2024-03-21 NOTE — Therapy (Incomplete)
 OUTPATIENT PHYSICAL THERAPY NEURO EVALUATION   Patient Name: [name] MRN: [id number] DOB:1985-10-16, 38 y.o., male Today's Date: 03/21/2024   PCP: Lonnie Earnest, MD  REFERRING PROVIDER: Will Almarie MATSU, MD  END OF SESSION:   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   Heart murmur    Hypertension    Low blood sugar    Marijuana abuse    Noncompliance    Pericardial effusion 03/28/2023   Tobacco abuse    Past Surgical History:  Procedure Laterality Date   A/V FISTULAGRAM Right 10/27/2022   Procedure: A/V Fistulagram;  Surgeon: Eliza Lonni RAMAN, MD;  Location: [facility] INVASIVE CV LAB;  Service: Cardiovascular;  Laterality: Right;   A/V FISTULAGRAM Left 01/27/2024   Procedure: A/V Fistulagram;  Surgeon: Lanis Fonda BRAVO, MD;  Location: [facility] INVASIVE CV LAB;  Service: Cardiovascular;  Laterality: Left;   APPENDECTOMY     AV FISTULA PLACEMENT Left 03/03/2019   Procedure: ARTERIOVENOUS (AV) FISTULA CREATION LEFT ARM;  Surgeon: Sheree Penne Lonni, MD;  Location: [facility] OR;  Service: Vascular;  Laterality: Left;   BASCILIC VEIN TRANSPOSITION Right 04/12/2019   Procedure: RIGHT UPPER EXTREMITY BASCILIC VEIN TRANSPOSITION FIRST STAGE FISTULA;  Surgeon: Eliza Lonni RAMAN, MD;  Location: [facility] OR;  Service: Vascular;  Laterality: Right;   BIOPSY  10/14/2021   Procedure: BIOPSY;  Surgeon: Legrand Victory LITTIE DOUGLAS, MD;  Location: [facility] ENDOSCOPY;  Service: Gastroenterology;;   BIOPSY  06/06/2022   Procedure: BIOPSY;  Surgeon: Shila Gustav GAILS, MD;  Location: [facility] ENDOSCOPY;  Service: Gastroenterology;;   BIOPSY  11/12/2022   Procedure: BIOPSY;  Surgeon: Wilhelmenia Aloha Raddle., MD;  Location: [facility] ENDOSCOPY;  Service: Gastroenterology;;   BIOPSY  12/05/2022   Procedure: BIOPSY;  Surgeon: Stacia Glendia BRAVO, MD;  Location: [facility] ENDOSCOPY;  Service: Gastroenterology;;   COLONOSCOPY WITH PROPOFOL  N/A 10/14/2021   Procedure: COLONOSCOPY WITH PROPOFOL ;  Surgeon: Legrand Victory LITTIE DOUGLAS, MD;  Location: [facility] ENDOSCOPY;  Service: Gastroenterology;  Laterality: N/A;   DIALYSIS/PERMA CATHETER INSERTION N/A 04/03/2023   Procedure: DIALYSIS/PERMA CATHETER INSERTION;  Surgeon: Magda Debby SAILOR, MD;  Location: [facility] INVASIVE CV LAB;  Service: Cardiovascular;  Laterality: N/A;   ESOPHAGOGASTRODUODENOSCOPY N/A 11/12/2022   Procedure: ESOPHAGOGASTRODUODENOSCOPY (EGD);  Surgeon: Wilhelmenia Aloha Raddle., MD;  Location: [facility] ENDOSCOPY;  Service: Gastroenterology;  Laterality: N/A;   ESOPHAGOGASTRODUODENOSCOPY N/A 12/05/2022   Procedure: ESOPHAGOGASTRODUODENOSCOPY (EGD);  Surgeon: Stacia Glendia BRAVO, MD;  Location: [facility] ENDOSCOPY;  Service: Gastroenterology;  Laterality: N/A;   ESOPHAGOGASTRODUODENOSCOPY (EGD) WITH PROPOFOL  N/A 06/06/2022   Procedure: ESOPHAGOGASTRODUODENOSCOPY (EGD) WITH PROPOFOL ;  Surgeon: Shila Gustav GAILS, MD;  Location: [facility] ENDOSCOPY;  Service: Gastroenterology;  Laterality: N/A;   FISTULA SUPERFICIALIZATION Right 11/07/2022   Procedure: PLICATION OF LARGE ANEURYSMS OF RIGHT ARTERIOVENOUS FISTULA WITH INSERTION OF 7mm INTERPOSITIONAL GORTEX GRAFT;  Surgeon: Eliza Lonni RAMAN, MD;  Location: [facility] OR;  Service: Vascular;  Laterality: Right;   FLEXIBLE SIGMOIDOSCOPY N/A 04/14/2022   Procedure: FLEXIBLE SIGMOIDOSCOPY;  Surgeon: Shila Gustav GAILS, MD;  Location: [facility] ENDOSCOPY;  Service: Gastroenterology;  Laterality: N/A;   INSERTION OF DIALYSIS CATHETER  N/A 11/07/2022   Procedure: INSERTION OF RIGHT INTERNAL JUGULAR TUNNELED DIALYSIS CATHETER;  Surgeon: Eliza Lonni RAMAN, MD;  Location: [facility] OR;  Service: Vascular;  Laterality: N/A;   LIGATION OF ARTERIOVENOUS  FISTULA Left  03/06/2019   Procedure: LIGATION OF ARTERIOVENOUS  FISTULA;  Surgeon: Gretta Lonni PARAS, MD;  Location: [facility] OR;  Service: Vascular;  Laterality: Left;   PERIPHERAL VASCULAR BALLOON ANGIOPLASTY  10/27/2022   Procedure: PERIPHERAL VASCULAR BALLOON ANGIOPLASTY;  Surgeon: Eliza Lonni RAMAN, MD;  Location: [facility] INVASIVE CV LAB;  Service: Cardiovascular;;  rt upper arm fistula   SAVORY DILATION N/A 11/12/2022   Procedure: SAVORY DILATION;  Surgeon: Wilhelmenia Aloha Raddle., MD;  Location: [facility] ENDOSCOPY;  Service: Gastroenterology;  Laterality: N/A;   spinal tap     SPINE SURGERY     related to a spinal infection, unsure of surgery or infection source   TRANSESOPHAGEAL ECHOCARDIOGRAM (CATH LAB) N/A 04/02/2023   Procedure: TRANSESOPHAGEAL ECHOCARDIOGRAM;  Surgeon: Jeffrie Oneil BROCKS, MD;  Location: [facility] INVASIVE CV LAB;  Service: Cardiovascular;  Laterality: N/A;   WISDOM TOOTH EXTRACTION     Patient Active Problem List   Diagnosis Date Noted   C. difficile diarrhea 03/21/2024   Mass of skin of right shoulder 03/19/2024   Klebsiella Bacteremia 03/18/2024   Chronic health problem 03/18/2024   Resistant hypertension 03/18/2024   COVID-19 virus detected 12/28/2023   Neurological abnormality 08/27/2023   Diarrhea 08/27/2023   Lipoma 07/22/2023   Acne vulgaris 07/22/2023   Anal itching 07/14/2023   Hypertensive crisis 05/08/2023   Moderate pericardial effusion 03/28/2023   GI bleed 12/04/2022   End-stage renal disease on hemodialysis (HCC) 12/04/2022   Cannabis use disorder, moderate, in controlled environment, dependence (HCC) 11/13/2022   Cannabis dependence with withdrawal (HCC) 11/13/2022   Anemia of chronic disease 11/12/2022   Esophageal dysphagia 11/11/2022   Abnormal barium swallow 11/11/2022   Chronic diastolic CHF (congestive heart failure) (HCC) 11/09/2022   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   Macrocytic anemia 11/08/2022   A-V fistula 11/07/2022   Aneurysm of arteriovenous  dialysis fistula 11/07/2022   Gastritis and gastroduodenitis 06/06/2022   Migraines 06/05/2022   Rectal bleeding 04/14/2022   Heart failure with mildly reduced ejection fraction (HFmrEF) (HCC) 04/08/2022   Bilateral shoulder pain 04/08/2022   Acute otitis externa of both ears 04/08/2022   Housing insecurity 01/01/2022   Food insecurity 01/01/2022   Non-compliance with renal dialysis    Hearing loss 10/08/2021   Acute lower GI bleeding 06/10/2021   Family history of diabetes mellitus 02/11/2021   Hypoglycemic syndrome 02/11/2021   Rash and nonspecific skin eruption 12/16/2019   Anemia associated with chronic renal failure 03/06/2019   Poorly-controlled hypertension 06/07/2018   Alport syndrome 07/14/2017   GERD (gastroesophageal reflux disease) 07/14/2017   History of appendectomy 07/14/2017   Obesity (BMI 30.0-34.9) 07/14/2017   Pre-transplant evaluation for kidney transplant 07/14/2017   Secondary hyperparathyroidism 07/14/2017   Tobacco use 07/14/2017   Bipolar 1 disorder (HCC) 09/01/2013   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    ONSET DATE: ***  REFERRING DIAG: I10 (ICD-10-CM) - HTN (hypertension)  THERAPY DIAG:  No diagnosis found.  Rationale for Evaluation and Treatment: Rehabilitation  SUBJECTIVE:  SUBJECTIVE STATEMENT: *** Pt accompanied by: {accompnied:27141}  PERTINENT HISTORY: HTN HFpEF Alport syndrome ESRD on HD ( MWFS) GERD Anemia bipolar 1 hearing loss depression tobacco use  PAIN:  Are you having pain? {OPRCPAIN:27236}  PRECAUTIONS: {Therapy precautions:24002}  RED FLAGS: {PT Red Flags:29287}   WEIGHT BEARING RESTRICTIONS: {Yes ***/No:24003}  FALLS: Has patient fallen in last 6 months? {fallsyesno:27318}  LIVING ENVIRONMENT: Lives with: {OPRC lives  with:25569::lives with their family} Lives in: {Lives in:25570} Stairs: {opstairs:27293} Has following equipment at home: {Assistive devices:23999}  PLOF: {PLOF:24004}  PATIENT GOALS: ***  OBJECTIVE:  Note: Objective measures were completed at Evaluation unless otherwise noted.  DIAGNOSTIC FINDINGS: 01/20/24 head CT: No acute intracranial process.   COGNITION: Overall cognitive status: {cognition:24006}   SENSATION: {sensation:27233}  COORDINATION: Alternating pronation/supination: *** Alternating toe tap: *** Finger to nose: *** Heel to shin: ***    EDEMA:  {edema:24020}  MUSCLE TONE: {LE tone:25568}  POSTURE: {posture:25561}  LOWER EXTREMITY ROM:     Active  Right Eval Left Eval  Hip flexion    Hip extension    Hip abduction    Hip adduction    Hip internal rotation    Hip external rotation    Knee flexion    Knee extension    Ankle dorsiflexion    Ankle plantarflexion    Ankle inversion    Ankle eversion     (Blank rows = not tested)  LOWER EXTREMITY MMT:    MMT Right Eval Left Eval  Hip flexion    Hip extension    Hip abduction    Hip adduction    Hip internal rotation    Hip external rotation    Knee flexion    Knee extension    Ankle dorsiflexion    Ankle plantarflexion    Ankle inversion    Ankle eversion    (Blank rows = not tested)  GAIT: Findings: Assistive device utilized:{Assistive devices:23999}, Level of assistance: {Levels of assistance:24026}, and Comments: ***  FUNCTIONAL TESTS:  {Functional tests:24029}  PATIENT SURVEYS:  {rehab surveys:24030}                                                                                                                              TREATMENT DATE: ***    PATIENT EDUCATION: Education details: *** Person educated: {Person educated:25204} Education method: {Education Method:25205} Education comprehension: {Education Comprehension:25206}  HOME EXERCISE  PROGRAM: ***  GOALS: Goals reviewed with patient? Yes  SHORT TERM GOALS: Target date: {follow up:25551}  Patient to be independent with initial HEP. Baseline: HEP initiated Goal status: {GOALSTATUS:25110}    LONG TERM GOALS: Target date: {follow up:25551}  Patient to be independent with advanced HEP. Baseline: Not yet initiated  Goal status: {GOALSTATUS:25110}  Patient to demonstrate B LE strength >/=4+/5.  Baseline: See above Goal status: {GOALSTATUS:25110}  Patient to demonstrate *** ROM WFL and without pain limiting.  Baseline: *** Goal status: {GOALSTATUS:25110}  Patient to report and demonstrate improved head,  neck, and shoulder posture at rest and with activity.  Baseline: *** Goal status: {GOALSTATUS:25110}  Patient to demonstrate alternating reciprocal pattern when ascending and descending stairs with good stability and 1 handrail as needed.   Baseline: Unable Goal status: {GOALSTATUS:25110}  Patient to score at least 20/24 on DGI in order to decrease risk of falls.  Baseline: *** Goal status: {GOALSTATUS:25110}  Patient to complete TUG in <14 sec with LRAD in order to decrease risk of falls.   Baseline: *** Goal status: {GOALSTATUS:25110}  Patient to demonstrate 5xSTS test in <15 sec in order to decrease risk of falls.  Baseline: *** Goal status: {GOALSTATUS:25110}  Patient to score at least ***/56 on Berg in order to decrease risk of falls.  Baseline: *** Goal status: {GOALSTATUS:25110}  Patient to score at least *** on FOTO in order to indicate improved functional outcomes.  Baseline: *** Goal status: {GOALSTATUS:25110}  ASSESSMENT:  CLINICAL IMPRESSION:  Patient is a 38 y/o M presenting to OPPT with c/o *** s/p hospitalization 01/20/24-02/10/24 for Hypertensive Crisis.   Patient today presenting with ***.    Patient was educated on gentle *** HEP and reported understanding. Prior to current episode, patient was independent. Would benefit from  skilled PT services *** x/week for *** weeks to address aforementioned impairments in order to optimize level of function.    OBJECTIVE IMPAIRMENTS: {opptimpairments:25111}.   ACTIVITY LIMITATIONS: {activitylimitations:27494}  PARTICIPATION LIMITATIONS: {participationrestrictions:25113}  PERSONAL FACTORS: {Personal factors:25162} are also affecting patient's functional outcome.   REHAB POTENTIAL: {rehabpotential:25112}  CLINICAL DECISION MAKING: {clinical decision making:25114}  EVALUATION COMPLEXITY: {Evaluation complexity:25115}  PLAN:  PT FREQUENCY: {rehab frequency:25116}  PT DURATION: {rehab duration:25117}  PLANNED INTERVENTIONS: {rehab planned interventions:25118::97110-Therapeutic exercises,97530- Therapeutic [id number]- Neuromuscular re-education,97535- Self Rjmz,02859- Manual therapy,Patient/Family education}  PLAN FOR NEXT SESSION: ***   Slater MARLA Christians, PT 03/21/2024, 7:33 AM

## 2024-03-21 NOTE — Plan of Care (Signed)
 FMTS Brief Progress Note  S: Received message through [facility] chat from care nurse state patient c/o 10/10 pain at catheter removal site radiate to his jaw. Pt state pain radiate from upper arm to his right jaw w/ mild chest pain   O: BP (!) 202/107 (BP Location: Left Leg)   Pulse 80   Temp 98.1 F (36.7 C) (Oral)   Resp 18   Ht 5' 8 (1.727 m)   Wt 76 kg   SpO2 100%   BMI 25.48 kg/m    RUE : pain w/ movement old catheter site slightly swollen w/ dsg CDI  A/P: - Adding Oxycodone  5-10 mg Q6H PRN to pain regimen - Gave Dilaudid  0.5 mg IV once for breakthrough pain - EKG and Trop ordered to rule out ACS - Will continue to monitor  Suzen Houston NOVAK, DO 03/21/2024, 6:45 AM PGY-1, [facility] Night Resident  Please page [phone] with questions.

## 2024-03-21 NOTE — Telephone Encounter (Signed)
 Patient Product/process Development Scientist completed.    The patient is insured through [employer]. Patient has Medicare and is not eligible for a copay card, but may be able to apply for patient assistance or Medicare RX Payment Plan (Patient Must reach out to their plan, if eligible for payment plan), if available.    Ran test claim for vancomycin  125 mg and the current 10 day co-pay is $1.60.   This test claim was processed through [facility]- copay amounts may vary at other pharmacies due to pharmacy/plan contracts, or as the patient moves through the different stages of their insurance plan.     Reyes Sharps, CPHT Pharmacy Technician Patient Advocate Specialist Lead [facility] Patient Advocate Team Direct Number: [phone]  Fax: [phone]

## 2024-03-22 ENCOUNTER — Inpatient Hospital Stay (HOSPITAL_COMMUNITY)

## 2024-03-22 ENCOUNTER — Ambulatory Visit: Admitting: Occupational Therapy

## 2024-03-22 ENCOUNTER — Ambulatory Visit: Admitting: Physical Therapy

## 2024-03-22 DIAGNOSIS — Z992 Dependence on renal dialysis: Secondary | ICD-10-CM | POA: Diagnosis not present

## 2024-03-22 DIAGNOSIS — N186 End stage renal disease: Secondary | ICD-10-CM | POA: Diagnosis not present

## 2024-03-22 DIAGNOSIS — R7881 Bacteremia: Secondary | ICD-10-CM | POA: Diagnosis not present

## 2024-03-22 HISTORY — PX: IR TUNNELED CENTRAL VENOUS CATH PLC W IMG: IMG1939

## 2024-03-22 LAB — CBC
HCT: 26.8 % — ABNORMAL LOW (ref 39.0–52.0)
Hemoglobin: 8.7 g/dL — ABNORMAL LOW (ref 13.0–17.0)
MCH: 26.9 pg (ref 26.0–34.0)
MCHC: 32.5 g/dL (ref 30.0–36.0)
MCV: 83 fL (ref 80.0–100.0)
Platelets: 258 K/uL (ref 150–400)
RBC: 3.23 MIL/uL — ABNORMAL LOW (ref 4.22–5.81)
RDW: 18.7 % — ABNORMAL HIGH (ref 11.5–15.5)
WBC: 6 K/uL (ref 4.0–10.5)
nRBC: 0 % (ref 0.0–0.2)

## 2024-03-22 LAB — RENAL FUNCTION PANEL
Albumin: 3 g/dL — ABNORMAL LOW (ref 3.5–5.0)
Anion gap: 20 — ABNORMAL HIGH (ref 5–15)
BUN: 75 mg/dL — ABNORMAL HIGH (ref 6–20)
CO2: 18 mmol/L — ABNORMAL LOW (ref 22–32)
Calcium: 8.8 mg/dL — ABNORMAL LOW (ref 8.9–10.3)
Chloride: 96 mmol/L — ABNORMAL LOW (ref 98–111)
Creatinine, Ser: 15.43 mg/dL — ABNORMAL HIGH (ref 0.61–1.24)
GFR, Estimated: 4 mL/min — ABNORMAL LOW (ref >60)
Glucose, Bld: 79 mg/dL (ref 70–99)
Phosphorus: 7.3 mg/dL — ABNORMAL HIGH (ref 2.5–4.6)
Potassium: 6.2 mmol/L — ABNORMAL HIGH (ref 3.5–5.1)
Sodium: 134 mmol/L — ABNORMAL LOW (ref 135–145)

## 2024-03-22 LAB — GLUCOSE, CAPILLARY
Glucose-Capillary: 69 mg/dL — ABNORMAL LOW (ref 70–99)
Glucose-Capillary: 107 mg/dL — ABNORMAL HIGH (ref 70–99)
Glucose-Capillary: 124 mg/dL — ABNORMAL HIGH (ref 70–99)

## 2024-03-22 LAB — POTASSIUM: Potassium: 6.5 mmol/L (ref 3.5–5.1)

## 2024-03-22 MED ORDER — ALTEPLASE 2 MG IJ SOLR: 2.0000 mg | Freq: Once | INTRAMUSCULAR | Status: DC | PRN

## 2024-03-22 MED ORDER — SODIUM ZIRCONIUM CYCLOSILICATE 10 G PO PACK
10.0000 g | PACK | Freq: Once | ORAL | Status: AC
  Administered 2024-03-22: 10 g via ORAL
  Filled 2024-03-22: qty 1

## 2024-03-22 MED ORDER — LIDOCAINE HCL (PF) 1 % IJ SOLN: 5.0000 mL | INTRAMUSCULAR | Status: DC | PRN

## 2024-03-22 MED ORDER — PENTAFLUOROPROP-TETRAFLUOROETH EX AERO
1.0000 | INHALATION_SPRAY | CUTANEOUS | Status: DC | PRN
Start: 2024-03-22 — End: 2024-03-23

## 2024-03-22 MED ORDER — SEVELAMER CARBONATE 800 MG PO TABS
2400.0000 mg | ORAL_TABLET | Freq: Three times a day (TID) | ORAL | Status: DC
  Administered 2024-03-22 – 2024-03-24 (×3): 2400 mg via ORAL
  Filled 2024-03-22 (×4): qty 3

## 2024-03-22 MED ORDER — ANTICOAGULANT SODIUM CITRATE 4% (200MG/5ML) IV SOLN: 5.0000 mL | Status: DC | PRN

## 2024-03-22 MED ORDER — LIDOCAINE-PRILOCAINE 2.5-2.5 % EX CREA
1.0000 | TOPICAL_CREAM | CUTANEOUS | Status: DC | PRN
Start: 2024-03-22 — End: 2024-03-23

## 2024-03-22 MED ORDER — ALTEPLASE 2 MG IJ SOLR
2.0000 mg | Freq: Once | INTRAMUSCULAR | Status: DC | PRN
Start: 2024-03-22 — End: 2024-03-22

## 2024-03-22 MED ORDER — HEPARIN SODIUM (PORCINE) 1000 UNIT/ML DIALYSIS: 1000.0000 [IU] | INTRAMUSCULAR | Status: DC | PRN

## 2024-03-22 MED ORDER — SODIUM ZIRCONIUM CYCLOSILICATE 5 G PO PACK
5.0000 g | PACK | Freq: Once | ORAL | Status: AC
  Administered 2024-03-22: 5 g via ORAL
  Filled 2024-03-22: qty 1

## 2024-03-22 MED ORDER — ONDANSETRON HCL 4 MG/2ML IJ SOLN
4.0000 mg | Freq: Once | INTRAMUSCULAR | Status: DC
Start: 2024-03-22 — End: 2024-03-22

## 2024-03-22 MED ORDER — ONDANSETRON HCL 4 MG/2ML IJ SOLN
4.0000 mg | Freq: Four times a day (QID) | INTRAMUSCULAR | Status: DC | PRN
  Administered 2024-03-22: 4 mg via INTRAVENOUS
  Filled 2024-03-22: qty 2

## 2024-03-22 MED ORDER — LIDOCAINE-EPINEPHRINE 1 %-1:100000 IJ SOLN
20.0000 mL | Freq: Once | INTRAMUSCULAR | Status: AC
  Administered 2024-03-22: 20 mL
  Filled 2024-03-22: qty 20

## 2024-03-22 MED ORDER — HYDROMORPHONE HCL 1 MG/ML IJ SOLN
0.5000 mg | Freq: Four times a day (QID) | INTRAMUSCULAR | Status: DC | PRN
  Administered 2024-03-22 – 2024-03-24 (×4): 0.5 mg via INTRAVENOUS
  Filled 2024-03-22 (×5): qty 0.5

## 2024-03-22 MED ORDER — LIDOCAINE-PRILOCAINE 2.5-2.5 % EX CREA: 1.0000 | TOPICAL_CREAM | CUTANEOUS | Status: DC | PRN

## 2024-03-22 MED ORDER — LIDOCAINE-EPINEPHRINE 1 %-1:100000 IJ SOLN
INTRAMUSCULAR | Status: AC
  Filled 2024-03-22: qty 1

## 2024-03-22 MED ORDER — HEPARIN SODIUM (PORCINE) 1000 UNIT/ML IJ SOLN
10.0000 mL | Freq: Once | INTRAMUSCULAR | Status: DC
Start: 2024-03-22 — End: 2024-03-24

## 2024-03-22 MED ORDER — PENTAFLUOROPROP-TETRAFLUOROETH EX AERO: 1.0000 | INHALATION_SPRAY | CUTANEOUS | Status: DC | PRN

## 2024-03-22 MED ORDER — DEXTROSE 50 % IV SOLN: 25.0000 g | Freq: Once | INTRAVENOUS | Status: AC

## 2024-03-22 MED ORDER — HEPARIN SODIUM (PORCINE) 1000 UNIT/ML IJ SOLN
INTRAMUSCULAR | Status: AC
Start: 2024-03-22 — End: 2024-03-22
  Filled 2024-03-22: qty 10

## 2024-03-22 MED ORDER — DEXTROSE 50 % IV SOLN
INTRAVENOUS | Status: AC
  Administered 2024-03-22: 25 g via INTRAVENOUS
  Filled 2024-03-22: qty 50

## 2024-03-22 NOTE — Progress Notes (Signed)
   03/21/24 2351  Assess: MEWS Score  Temp 98 F (36.7 C)  BP (!) 207/114  MAP (mmHg) 136  Pulse Rate 74  Resp 16  Level of Consciousness Alert  SpO2 97 %  O2 Device Room Air  Assess: MEWS Score  MEWS Temp 0  MEWS Systolic 2  MEWS Pulse 0  MEWS RR 0  MEWS LOC 0  MEWS Score 2  MEWS Score Color Yellow  Assess: if the MEWS score is Yellow or Red  Were vital signs accurate and taken at a resting state? Yes  Does the patient meet 2 or more of the SIRS criteria? No  Does the patient have a confirmed or suspected source of infection? Yes  MEWS guidelines implemented  No, previously yellow, continue vital signs every 4 hours  Provider Notification  Provider Name/Title Suknaim,DO  Date Provider Notified 03/22/24  Time Provider Notified 0025  Method of Notification  (secure chat)  Notification Reason Other (Comment) (BP 207/114 HR 74)  Provider response No new orders  Date of Provider Response 03/22/24  Time of Provider Response 0030  Assess: SIRS CRITERIA  SIRS Temperature  0  SIRS Respirations  0  SIRS Pulse 0  SIRS WBC 0  SIRS Score Sum  0

## 2024-03-22 NOTE — Progress Notes (Incomplete)
 Patient c/o throbbing pain on his new rt neck catheter site and rt arm,pain 10/10. Dr. Suknaim

## 2024-03-22 NOTE — H&P (View-Only) (Signed)
 Daily Progress Note Intern Pager: [phone]  Patient name: [name] Medical record number: [id number] Date of birth: July 06, 1985 Age: 38 y.o. Gender: male  Primary Care Provider: Lonnie Earnest, MD Consultants: Nephrology Code Status: Full  Pt Overview and Major Events to Date:    Assessment and Plan: [name] is a 38 year old male with a past medical history of Alport syndrome bilateral hearing loss, HTN, bipolar disorder, HF, ESRD on HD MWF.  Admitted for chills with positive BCx from outpatient HD (collected 11/3) showing Klebsiella oxytoca.  Suspected source of infection from HD catheter.  IR delayed catheter replacement due to elevated potassium levels, temporary cath placed for dialysis, monitor labs and follow up with IR   Assessment & Plan Klebsiella Bacteremia Per ID, recommend good back/spine exam to consider MRI following if any concern for spinal infection. Blood Cx showed s/p cath removal showed no growth - ID consulted, appreciate recommendations - TEE ordered for 11/12 per ID recs - NPO at midnight - Abx: IV Cefepime  1g daily (11/7- ) - Pain: Tylenol  1000 mg q6h PRN - Labs: AM RFP, CBC End-stage renal disease on hemodialysis (HCC) Anemia associated with chronic renal failure HD MWF. NPO lifted per Nephro for Cath replacement. Temp cath placed since K 6.2, hold off on [facility] Cath placement, Lokelma  5g given in AM, redraw levels and f/u for IR - Nephrology following, appreciate recommendations - Cath/TDC placement pending K - Additional Lokelma  10g, total 15g - Oxycodone  5-10mg  q6 prn breakthrough pain from catheter removal site - Renvela  1600 mg TID with meals - Cinacalcet  30 mg MWF - Micera 150 mcg q2 weeks (last 11/3) - Monitor hemodynamic status closely  - Transfusion threshold 7, transfuse prn Resistant hypertension BP 186/99 - Continuing home meds: Carvedilol  25 mg BID, losartan  100 mg daily, nifedipine  90 mg BID, spironolactone  100 mg  daily, minoxidil  2.5 mg BID - Nephrology recommended hydralazine , to reduce pill burden BiDil 20-37.5 mg started, home med isosorbide  dinitrate discontinued - Monitor closely Diarrhea C. difficile diarrhea Ongoing watery diarrhea and with multiple abx and Protonix  - C diff positive - Enteric precautions in place - Vancomycin  125mg  daily per pharm (11/9-) Mass of skin of right shoulder No concern for infection at this time given exam and patient's report. - Will have patient follow-up outpatient for further management Chronic health problem Alport syndrome: Bilateral sensorineural hearing loss Anemia of Chronic Disease: S/p ESA outpatient 11/3. Continue to optimize with nephrology. Hgb of 7.5 this am, stable from 7.2 yesterday. Bipolar 1 disorder: Continue Depakote  500 mg daily Migraines: Continue Depakote  500 mg daily GERD: Continue Protonix  40 mg daily  HFpEF: EF 60 to 65%, no RVMA, mildly dilated pulmonary artery with normal PAP   FEN/GI: Regular Diet PPx: Heparin  Dispo: Med-Surg  Subjective:  Patient found ambulating in room. Denies any chest pain, only complaint IV pain on hand.   Objective: Temp:  [97.5 F (36.4 C)-98.3 F (36.8 C)] 98.3 F (36.8 C) (11/11 0722) Pulse Rate:  [73-82] 78 (11/11 0722) Resp:  [16-18] 18 (11/11 0722) BP: (169-209)/(86-114) 175/101 (11/11 0722) SpO2:  [95 %-98 %] 98 % (11/11 0722)  Physical Exam General: Well-appearing, no acute distress Cardio: Regular rate, regular rhythm, no murmurs on exam. Pulm: Clear, no wheezing, no crackles. No increased work of breathing Abdominal: bowel sounds present, soft, non-tender, non-distended Extremities: no peripheral edema, bruit at site of port on L shoulder   Laboratory: Most recent CBC Lab Results  Component Value Date  WBC 6.0 03/22/2024   HGB 8.7 (L) 03/22/2024   HCT 26.8 (L) 03/22/2024   MCV 83.0 03/22/2024   PLT 258 03/22/2024   Most recent BMP    Latest Ref Rng & Units 03/22/2024     4:28 AM  BMP  Glucose 70 - 99 mg/dL 79   BUN 6 - 20 mg/dL 75   Creatinine 9.38 - 1.24 mg/dL 84.56   Sodium 864 - 854 mmol/L 134   Potassium 3.5 - 5.1 mmol/L 6.2   Chloride 98 - 111 mmol/L 96   CO2 22 - 32 mmol/L 18   Calcium  8.9 - 10.3 mg/dL 8.8      [name], MD 03/22/2024, 8:05 AM  PGY-1,  Family Medicine FPTS Intern pager: [phone], text pages welcome Secure chat group [facility] Teaching Service

## 2024-03-22 NOTE — Anesthesia Preprocedure Evaluation (Addendum)
 Anesthesia Evaluation  Patient identified by MRN, date of birth, ID band Patient awake    Reviewed: Allergy & Precautions, NPO status , Patient's Chart, lab work & pertinent test results, reviewed documented beta blocker date and time   History of Anesthesia Complications Negative for: history of anesthetic complications  Airway Mallampati: II  TM Distance: >3 FB Neck ROM: Full    Dental no notable dental hx.    Pulmonary Current Smoker and Patient abstained from smoking.   Pulmonary exam normal        Cardiovascular hypertension, Pt. on medications and Pt. on home beta blockers +CHF  Normal cardiovascular exam  TTE 03/19/24: EF 60-65%, moderate LVH, mild pHTN, moderate LAE/RAE, moderate TR     Neuro/Psych  Headaches   Depression Bipolar Disorder      GI/Hepatic Neg liver ROS,GERD  Medicated,,  Endo/Other  negative endocrine ROS    Renal/GU ESRF and DialysisRenal disease (HD M/W/F/Sa, had TDC placed 03/22/24)     Musculoskeletal negative musculoskeletal ROS (+)    Abdominal   Peds  Hematology negative hematology ROS (+)   Anesthesia Other Findings bacteremia  Reproductive/Obstetrics                              Anesthesia Physical Anesthesia Plan  ASA: 4  Anesthesia Plan: MAC   Post-op Pain Management: Minimal or no pain anticipated   Induction:   PONV Risk Score and Plan: Treatment may vary due to age or medical condition and Propofol  infusion  Airway Management Planned: Natural Airway and Nasal Cannula  Additional Equipment: None  Intra-op Plan:   Post-operative Plan:   Informed Consent: I have reviewed the patients History and Physical, chart, labs and discussed the procedure including the risks, benefits and alternatives for the proposed anesthesia with the patient or authorized representative who has indicated his/her understanding and acceptance.       Plan  Discussed with: CRNA  Anesthesia Plan Comments:          Anesthesia Quick Evaluation

## 2024-03-22 NOTE — Assessment & Plan Note (Addendum)
 Ongoing watery diarrhea and with multiple abx and Protonix  - C diff positive - Enteric precautions in place - Vancomycin  125mg  daily per pharm (11/9-)

## 2024-03-22 NOTE — Plan of Care (Signed)

## 2024-03-22 NOTE — Assessment & Plan Note (Addendum)
 HD MWF. NPO lifted per Nephro for Cath replacement. Temp cath placed since K 6.2, hold off on [facility] Cath placement, Lokelma  5g given in AM, redraw levels and f/u for IR - Nephrology following, appreciate recommendations - Cath/TDC placement pending K - Additional Lokelma  10g, total 15g - Oxycodone  5-10mg  q6 prn breakthrough pain from catheter removal site - Renvela  1600 mg TID with meals - Cinacalcet  30 mg MWF - Micera 150 mcg q2 weeks (last 11/3) - Monitor hemodynamic status closely  - Transfusion threshold 7, transfuse prn

## 2024-03-22 NOTE — Assessment & Plan Note (Addendum)
 Per ID, recommend good back/spine exam to consider MRI following if any concern for spinal infection. Blood Cx showed s/p cath removal showed no growth - ID consulted, appreciate recommendations - TEE ordered for 11/12 per ID recs - NPO at midnight - Abx: IV Cefepime  1g daily (11/7- ) - Pain: Tylenol  1000 mg q6h PRN - Labs: AM RFP, CBC

## 2024-03-22 NOTE — Progress Notes (Signed)
 Daily Progress Note Intern Pager: [phone]  Patient name: [name] Medical record number: [id number] Date of birth: July 06, 1985 Age: 38 y.o. Gender: male  Primary Care Provider: Lonnie Earnest, MD Consultants: Nephrology Code Status: Full  Pt Overview and Major Events to Date:    Assessment and Plan: [name] is a 38 year old male with a past medical history of Alport syndrome bilateral hearing loss, HTN, bipolar disorder, HF, ESRD on HD MWF.  Admitted for chills with positive BCx from outpatient HD (collected 11/3) showing Klebsiella oxytoca.  Suspected source of infection from HD catheter.  IR delayed catheter replacement due to elevated potassium levels, temporary cath placed for dialysis, monitor labs and follow up with IR   Assessment & Plan Klebsiella Bacteremia Per ID, recommend good back/spine exam to consider MRI following if any concern for spinal infection. Blood Cx showed s/p cath removal showed no growth - ID consulted, appreciate recommendations - TEE ordered for 11/12 per ID recs - NPO at midnight - Abx: IV Cefepime  1g daily (11/7- ) - Pain: Tylenol  1000 mg q6h PRN - Labs: AM RFP, CBC End-stage renal disease on hemodialysis (HCC) Anemia associated with chronic renal failure HD MWF. NPO lifted per Nephro for Cath replacement. Temp cath placed since K 6.2, hold off on [facility] Cath placement, Lokelma  5g given in AM, redraw levels and f/u for IR - Nephrology following, appreciate recommendations - Cath/TDC placement pending K - Additional Lokelma  10g, total 15g - Oxycodone  5-10mg  q6 prn breakthrough pain from catheter removal site - Renvela  1600 mg TID with meals - Cinacalcet  30 mg MWF - Micera 150 mcg q2 weeks (last 11/3) - Monitor hemodynamic status closely  - Transfusion threshold 7, transfuse prn Resistant hypertension BP 186/99 - Continuing home meds: Carvedilol  25 mg BID, losartan  100 mg daily, nifedipine  90 mg BID, spironolactone  100 mg  daily, minoxidil  2.5 mg BID - Nephrology recommended hydralazine , to reduce pill burden BiDil 20-37.5 mg started, home med isosorbide  dinitrate discontinued - Monitor closely Diarrhea C. difficile diarrhea Ongoing watery diarrhea and with multiple abx and Protonix  - C diff positive - Enteric precautions in place - Vancomycin  125mg  daily per pharm (11/9-) Mass of skin of right shoulder No concern for infection at this time given exam and patient's report. - Will have patient follow-up outpatient for further management Chronic health problem Alport syndrome: Bilateral sensorineural hearing loss Anemia of Chronic Disease: S/p ESA outpatient 11/3. Continue to optimize with nephrology. Hgb of 7.5 this am, stable from 7.2 yesterday. Bipolar 1 disorder: Continue Depakote  500 mg daily Migraines: Continue Depakote  500 mg daily GERD: Continue Protonix  40 mg daily  HFpEF: EF 60 to 65%, no RVMA, mildly dilated pulmonary artery with normal PAP   FEN/GI: Regular Diet PPx: Heparin  Dispo: Med-Surg  Subjective:  Patient found ambulating in room. Denies any chest pain, only complaint IV pain on hand.   Objective: Temp:  [97.5 F (36.4 C)-98.3 F (36.8 C)] 98.3 F (36.8 C) (11/11 0722) Pulse Rate:  [73-82] 78 (11/11 0722) Resp:  [16-18] 18 (11/11 0722) BP: (169-209)/(86-114) 175/101 (11/11 0722) SpO2:  [95 %-98 %] 98 % (11/11 0722)  Physical Exam General: Well-appearing, no acute distress Cardio: Regular rate, regular rhythm, no murmurs on exam. Pulm: Clear, no wheezing, no crackles. No increased work of breathing Abdominal: bowel sounds present, soft, non-tender, non-distended Extremities: no peripheral edema, bruit at site of port on L shoulder   Laboratory: Most recent CBC Lab Results  Component Value Date  WBC 6.0 03/22/2024   HGB 8.7 (L) 03/22/2024   HCT 26.8 (L) 03/22/2024   MCV 83.0 03/22/2024   PLT 258 03/22/2024   Most recent BMP    Latest Ref Rng & Units 03/22/2024     4:28 AM  BMP  Glucose 70 - 99 mg/dL 79   BUN 6 - 20 mg/dL 75   Creatinine 9.38 - 1.24 mg/dL 84.56   Sodium 864 - 854 mmol/L 134   Potassium 3.5 - 5.1 mmol/L 6.2   Chloride 98 - 111 mmol/L 96   CO2 22 - 32 mmol/L 18   Calcium  8.9 - 10.3 mg/dL 8.8      Edward Palma, MD 03/22/2024, 8:05 AM  PGY-1,  Family Medicine FPTS Intern pager: [phone], text pages welcome Secure chat group [facility] Teaching Service

## 2024-03-22 NOTE — Assessment & Plan Note (Signed)
 Alport syndrome: Bilateral sensorineural hearing loss Anemia of Chronic Disease: S/p ESA outpatient 11/3. Continue to optimize with nephrology. Hgb of 7.5 this am, stable from 7.2 yesterday. Bipolar 1 disorder: Continue Depakote  500 mg daily Migraines: Continue Depakote  500 mg daily GERD: Continue Protonix  40 mg daily HFpEF: EF 60 to 65%, no RVMA, mildly dilated pulmonary artery with normal PAP

## 2024-03-22 NOTE — TOC Initial Note (Signed)
 Transition of Care [facility]) - Initial/Assessment Note    Patient Details  Name: [name] MRN: [id number] Date of Birth: 07-Jan-1986  [facility] CM/SW Contact:    Lendia Dais, LCSWA Phone Number: 03/22/2024, 3:57 PM  Clinical Narrative: Pt is from home alone, independent with ADL's, and has the DME of a cane. Pt is able to drive and mentioned that he has 3 minor children the ages of 4, 30, and 62 who stay with their mother.   Pt gave CSW permission to contact [employer]. CSW inquired about documentation for HCPOA. Pt stated that they did it through the hospital and do not need paperwork for HCPOA. CSW attempted to explain the need for it, but the pt stated the same thing.   Pt states that they have no SDOH concerns and receive SSI for income and food stamps. 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 CSW inquired about any accommodations that could've been made and the pt declined.  Pt has no hx of HH but stated they were supposed to have OOPT after their last admission and but couldn't go due to readmission. CSW inquired with RN about PT eval. PT/OT eval pending.  Pt mentioned concerns of where they are on the kidney list. Pt states they have family members willing to donate a kidney and are confused why they cannot get the transplant/ CSW stated they can ask and find out. CSW spoke to HD social worker and mentioned they would reach out to HD clinic social worker to inquire about the issue.  No current TOC/ICM needs. Please place [facility] consult for further needs.                       Expected Discharge Plan: Home/Self Care Barriers to Discharge: Continued Medical Work up   Patient Goals and CMS Choice Patient states their goals for this hospitalization and ongoing recovery are:: Return to work after kidney transplant           Expected Discharge Plan and Services In-house Referral: Clinical Social Work     Living arrangements for the past 2 months: Single Family Home                                      Prior Living Arrangements/Services Living arrangements for the past 2 months: Single Family Home Lives with:: Self Patient language and need for interpreter reviewed:: Yes Do you feel safe going back to the place where you live?: Yes      Need for Family Participation in Patient Care: No (Comment) Care giver support system in place?: No (comment) Current home services: DME Criminal Activity/Legal Involvement Pertinent to Current Situation/Hospitalization: No - Comment as needed  Activities of Daily Living   ADL Screening (condition at time of admission) Independently performs ADLs?: Yes (appropriate for developmental age) Is the patient deaf or have difficulty hearing?: Yes Does the patient have difficulty seeing, even when wearing glasses/contacts?: No Does the patient have difficulty concentrating, remembering, or making decisions?: No  Permission Sought/Granted Permission sought to share information with : Family Supports Permission granted to share information with : Yes, Verbal Permission Granted  Share Information with NAME: Roscoe     Permission granted to share info  w Relationship: Vuolo  Permission granted to share info w Contact Information: [phone]  Emotional Assessment Appearance:: Appears stated age Attitude/Demeanor/Rapport: Engaged   Orientation: : Oriented to Situation, Oriented to Self, Oriented to Place, Oriented to  Time Alcohol / Substance Use: Not Applicable Psych Involvement: No (comment)  Admission diagnosis:  Bacteremia [R78.81] Anemia associated with chronic renal failure [N18.9, D63.1] End-stage renal disease on hemodialysis (HCC) [N18.6, Z99.2] Patient Active Problem List   Diagnosis Date Noted   C. difficile diarrhea 03/21/2024   Mass of skin  of right shoulder 03/19/2024   Klebsiella Bacteremia 03/18/2024   Chronic health problem 03/18/2024   Resistant hypertension 03/18/2024   COVID-19 virus detected 12/28/2023   Neurological abnormality 08/27/2023   Diarrhea 08/27/2023   Lipoma 07/22/2023   Acne vulgaris 07/22/2023   Anal itching 07/14/2023   Hypertensive crisis 05/08/2023   Moderate pericardial effusion 03/28/2023   GI bleed 12/04/2022   End-stage renal disease on hemodialysis (HCC) 12/04/2022   Cannabis use disorder, moderate, in controlled environment, dependence (HCC) 11/13/2022   Cannabis dependence with withdrawal (HCC) 11/13/2022   Anemia of chronic disease 11/12/2022   Esophageal dysphagia 11/11/2022   Abnormal barium swallow 11/11/2022   Chronic diastolic CHF (congestive heart failure) (HCC) 11/09/2022   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   Macrocytic anemia 11/08/2022   A-V fistula 11/07/2022   Aneurysm of arteriovenous dialysis fistula 11/07/2022   Gastritis and gastroduodenitis 06/06/2022   Migraines 06/05/2022   Rectal bleeding 04/14/2022   Heart failure with mildly reduced ejection fraction (HFmrEF) (HCC) 04/08/2022   Bilateral shoulder pain 04/08/2022   Acute otitis externa of both ears 04/08/2022   Housing insecurity 01/01/2022   Food insecurity 01/01/2022   Non-compliance with renal dialysis    Hearing loss 10/08/2021   Acute lower GI bleeding 06/10/2021   Family history of diabetes mellitus 02/11/2021   Hypoglycemic syndrome 02/11/2021   Rash and nonspecific skin eruption 12/16/2019   Anemia associated with chronic renal failure 03/06/2019   Poorly-controlled hypertension 06/07/2018   Alport syndrome 07/14/2017   GERD (gastroesophageal reflux disease) 07/14/2017   History of appendectomy 07/14/2017   Obesity (BMI 30.0-34.9) 07/14/2017   Pre-transplant evaluation for kidney transplant 07/14/2017   Secondary hyperparathyroidism 07/14/2017   Tobacco use 07/14/2017   Bipolar 1  disorder (HCC) 09/01/2013   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   PCP:  Lonnie Earnest, MD Pharmacy:   [id number] - [place], [place] - [address] AT [facility] [address] [place] [address] Phone: [phone] Fax: [phone]     Social Drivers of Health (SDOH) Social History: SDOH Screenings   Food Insecurity: Food Insecurity Present (03/19/2024)  Housing: High Risk (03/19/2024)  Transportation Needs: Patient Declined (03/19/2024)  Utilities: Not At Risk (03/19/2024)  Depression (PHQ2-9): Medium Risk (06/02/2023)  Financial Resource Strain: High Risk (03/15/2024)  Physical Activity: Inactive (06/02/2023)  Social Connections: Moderately Isolated (03/19/2024)  Stress: Stress Concern Present (03/15/2024)  Tobacco Use: High Risk (03/18/2024)   SDOH Interventions:     Readmission Risk Interventions    02/10/2024    3:12 PM 12/28/2023    1:41 PM  Readmission Risk Prevention Plan  Transportation Screening Complete Complete  PCP or Specialist Appt within 3-5 Days  Complete  HRI or Home Care Consult  Complete  Social Work Consult for Recovery Care Planning/Counseling  Complete  Palliative Care Screening  Not Applicable  Medication Review Oceanographer)  Referral to Pharmacy  [facility] or Home Care Consult Complete  SW Recovery Care/Counseling Consult Complete   Palliative Care Screening Not Applicable   Skilled Nursing Facility Not Applicable

## 2024-03-22 NOTE — Progress Notes (Signed)
   [facility] has been requested to perform a transesophageal echocardiogram on [name] for bacteremia.     Relative Contraindications: History of dysphagia Underwent esophageal dilation in 11/2022  Patient has been tolerating regular diet since admission, he reports no issues with dysphagia this admission.   The patient has: No other conditions that may impact this procedure.    After careful review of history and examination, the risks and benefits of transesophageal echocardiogram have been explained including risks of esophageal damage, perforation (1:10,000 risk), bleeding, pharyngeal hematoma as well as other potential complications associated with conscious sedation including aspiration, arrhythmia, respiratory failure and death. Alternatives to treatment were discussed, questions were answered. Patient is willing to proceed.   Signed, Waddell DELENA Donath, PA-C  03/22/2024 2:47 PM

## 2024-03-22 NOTE — Assessment & Plan Note (Addendum)
 BP 186/99 - Continuing home meds: Carvedilol  25 mg BID, losartan  100 mg daily, nifedipine  90 mg BID, spironolactone  100 mg daily, minoxidil  2.5 mg BID - Nephrology recommended hydralazine , to reduce pill burden BiDil 20-37.5 mg started, home med isosorbide  dinitrate discontinued - Monitor closely

## 2024-03-22 NOTE — Assessment & Plan Note (Addendum)
 HD MWF. NPO lifted per Nephro for Cath replacement. Temp cath placed since K 6.2, hold off on [facility] Cath placement, Lokelma  5g given in AM, redraw levels and f/u for IR - Nephrology following, appreciate recommendations - Cath/TDC placement pending K - Additional Lokelma  10g, total 15g - Oxycodone  5-10mg  q6 prn breakthrough pain from catheter removal site - Renvela  1600 mg TID with meals - Cinacalcet  30 mg MWF - Micera 150 mcg q2 weeks (last 11/3) - Monitor hemodynamic status closely  - Transfusion threshold 7, transfuse prn

## 2024-03-22 NOTE — Assessment & Plan Note (Signed)
 No concern for infection at this time given exam and patient's report. - Will have patient follow-up outpatient for further management

## 2024-03-22 NOTE — Progress Notes (Signed)
 [facility] Progress Note   Subjective:   Seen in room - going down to IR for new [facility] placement this AM followed by dialysis. No CP/dyspnea today.  Objective Vitals:   03/21/24 2351 03/22/24 0553 03/22/24 0722 03/22/24 0942  BP: (!) 207/114 (!) 171/93 (!) 175/101 (!) 148/89  Pulse: 74 78 78   Resp: 16 18 18    Temp: 98 F (36.7 C) 97.6 F (36.4 C) 98.3 F (36.8 C)   TempSrc: Oral Oral Oral   SpO2: 97% 95% 98%   Weight:      Height:       Physical Exam General: Well appearing, NAD. Deaf Heart: RRR Lungs: CTAB Abdomen: soft Extremities: no LE edema Dialysis Access: LUE AVG +bruit  Additional Objective Labs: Basic Metabolic Panel: Recent Labs  Lab 03/20/24 0255 03/21/24 0110 03/22/24 0428  NA 132* 136 134*  K 4.1 4.7 6.2*  CL 93* 98 96*  CO2 21* 20* 18*  GLUCOSE 69* 107* 79  BUN 50* 61* 75*  CREATININE 12.41* 13.88* 15.43*  CALCIUM  8.2* 8.5* 8.8*  PHOS 5.6* 6.7* 7.3*   Liver Function Tests: Recent Labs  Lab 03/18/24 0210 03/19/24 0436 03/20/24 0255 03/21/24 0110 03/22/24 0428  AST 21  --   --   --   --   ALT 20  --   --   --   --   ALKPHOS 164*  --   --   --   --   BILITOT 0.7  --   --   --   --   PROT 7.9  --   --   --   --   ALBUMIN  3.1*   < > 2.5* 2.5* 3.0*   < > = values in this interval not displayed.   Recent Labs  Lab 03/18/24 0210  LIPASE 32   CBC: Recent Labs  Lab 03/19/24 0436 03/20/24 0255 03/20/24 0700 03/20/24 1536 03/21/24 0110 03/21/24 0447 03/22/24 0428  WBC 7.7 5.7  --  5.5  --  6.0 6.0  HGB 7.5* 6.2*   < > 6.9* 7.3* 7.9* 8.7*  HCT 23.3* 19.8*   < > 22.3* 22.9* 24.5* 26.8*  MCV 81.2 82.2  --  83.2  --  81.9 83.0  PLT 269 248  --  278  --  262 258   < > = values in this interval not displayed.   Blood Culture    Component Value Date/Time   SDES BLOOD RIGHT HAND 03/20/2024 1352   SDES BLOOD RIGHT HAND 03/20/2024 1352   SPECREQUEST  03/20/2024 1352    BOTTLES DRAWN AEROBIC AND ANAEROBIC Blood Culture  results may not be optimal due to an inadequate volume of blood received in culture bottles   SPECREQUEST  03/20/2024 1352    BOTTLES DRAWN AEROBIC AND ANAEROBIC Blood Culture results may not be optimal due to an inadequate volume of blood received in culture bottles   CULT  03/20/2024 1352    NO GROWTH 2 DAYS Performed at [facility], [address], [place], [place] [address]    CULT  03/20/2024 1352    NO GROWTH 2 DAYS Performed at [facility], [address], [place], [place] [address]    REPTSTATUS PENDING 03/20/2024 1352   REPTSTATUS PENDING 03/20/2024 1352   Medications:   ceFAZolin  (ANCEF ) IV     ceFEPime  (MAXIPIME ) IV 1 g (03/21/24 1227)    calcitRIOL   1 mcg Oral Q M,W,F-HD   carvedilol   25 mg Oral BID   Chlorhexidine  Gluconate Cloth  6 each Topical Q0600   cinacalcet   30 mg Oral Q M,W,F-1800   divalproex   500 mg Oral Daily   famotidine   10 mg Oral Daily   heparin   5,000 Units Subcutaneous Q8H   isosorbide -hydrALAZINE   1 tablet Oral TID   losartan   100 mg Oral Daily   minoxidil   2.5 mg Oral BID   NIFEdipine   90 mg Oral BID   pantoprazole   40 mg Oral Daily   sevelamer  carbonate  1,600 mg Oral TID WC   spironolactone   100 mg Oral Daily   vancomycin   125 mg Oral QID    Dialysis Orders MWFSa - NW 4:00 Nipro EDW 72.5kg 2K/2Ca AVG/TDC  - No bolus heparin  order - Mircera 150 mcg q 2 weeks (last 11/3) - Calcitriol  1.0 q HD; Sensipar  30 q HD, sevelamer  800 2 q meals  - Last HD 11/5 -- post HD wt 77.4 kg    Assessment/Plan: Klebsiella bacteremia. Blood Cx 11/3 from OP HD unit grew Klebsiella oxytoca. Prior Klebsiella bacteremia in October and completed course of cefepime . Repeat BCx 11/7, 11/9 NGTD. On cefepime . No vegetation on TTE. ID recommends TEE.  Catheter removed for line holiday on 11/9. For new [facility] today. ESRD. Now 4x/week HD - for HD today after line placement. Access. Using [facility].  L arm AVG placed at [facility] 11/2023. Graft patent on f'gram in Sept.  Have been unable to cannulate graft successfully to date. Appreciate VVS input - no need for another shuntogram. Will most likely need new perm access. F/u as outpatient.  Hypertension/volume. Improving with meds and HD, UF as tolerated (remains well above dry weight) Anemia. Hgb 8.7. Due for ESA on 11/17. S/p 1U RPBCs 11/9.   Metabolic bone disease.  Ca ok, Phos high. Continue home meds, ^ binder dose.  Nutrition. On regular diet with fluid restriction. Counseled on low K diet choices.  C.diff. On PO Vanc.    Izetta Boehringer, PA-C 03/22/2024, 10:55 AM  [employer]

## 2024-03-22 NOTE — Procedures (Signed)
 Interventional Radiology Procedure Note  Procedure: Temp HD line placement  Complications: None  Estimated Blood Loss: < 10 mL  Findings: RIJ access for Trialysis catheter placement.  Cordella DELENA Banner, MD

## 2024-03-23 ENCOUNTER — Inpatient Hospital Stay (HOSPITAL_COMMUNITY): Payer: Self-pay | Admitting: Anesthesiology

## 2024-03-23 ENCOUNTER — Inpatient Hospital Stay (HOSPITAL_COMMUNITY)

## 2024-03-23 ENCOUNTER — Encounter (HOSPITAL_COMMUNITY): Admission: EM | Disposition: A | Payer: Self-pay | Source: Home / Self Care | Attending: Family Medicine

## 2024-03-23 DIAGNOSIS — F17210 Nicotine dependence, cigarettes, uncomplicated: Secondary | ICD-10-CM

## 2024-03-23 DIAGNOSIS — Z992 Dependence on renal dialysis: Secondary | ICD-10-CM | POA: Diagnosis not present

## 2024-03-23 DIAGNOSIS — I132 Hypertensive heart and chronic kidney disease with heart failure and with stage 5 chronic kidney disease, or end stage renal disease: Secondary | ICD-10-CM

## 2024-03-23 DIAGNOSIS — R7881 Bacteremia: Secondary | ICD-10-CM | POA: Diagnosis not present

## 2024-03-23 DIAGNOSIS — I361 Nonrheumatic tricuspid (valve) insufficiency: Secondary | ICD-10-CM

## 2024-03-23 DIAGNOSIS — I5032 Chronic diastolic (congestive) heart failure: Secondary | ICD-10-CM

## 2024-03-23 DIAGNOSIS — N186 End stage renal disease: Secondary | ICD-10-CM | POA: Diagnosis not present

## 2024-03-23 DIAGNOSIS — N189 Chronic kidney disease, unspecified: Secondary | ICD-10-CM | POA: Diagnosis not present

## 2024-03-23 DIAGNOSIS — I38 Endocarditis, valve unspecified: Secondary | ICD-10-CM

## 2024-03-23 HISTORY — PX: IR TUNNELED CENTRAL VENOUS CATH PLC W IMG: IMG1939

## 2024-03-23 HISTORY — PX: TRANSESOPHAGEAL ECHOCARDIOGRAM (CATH LAB): EP1270

## 2024-03-23 LAB — RENAL FUNCTION PANEL
Albumin: 2.8 g/dL — ABNORMAL LOW (ref 3.5–5.0)
Anion gap: 18 — ABNORMAL HIGH (ref 5–15)
BUN: 41 mg/dL — ABNORMAL HIGH (ref 6–20)
CO2: 24 mmol/L (ref 22–32)
Calcium: 8.6 mg/dL — ABNORMAL LOW (ref 8.9–10.3)
Chloride: 92 mmol/L — ABNORMAL LOW (ref 98–111)
Creatinine, Ser: 9.62 mg/dL — ABNORMAL HIGH (ref 0.61–1.24)
GFR, Estimated: 7 mL/min — ABNORMAL LOW (ref >60)
Glucose, Bld: 69 mg/dL — ABNORMAL LOW (ref 70–99)
Phosphorus: 4.8 mg/dL — ABNORMAL HIGH (ref 2.5–4.6)
Potassium: 4 mmol/L (ref 3.5–5.1)
Sodium: 134 mmol/L — ABNORMAL LOW (ref 135–145)

## 2024-03-23 LAB — CBC
HCT: 25.6 % — ABNORMAL LOW (ref 39.0–52.0)
Hemoglobin: 8.3 g/dL — ABNORMAL LOW (ref 13.0–17.0)
MCH: 26.9 pg (ref 26.0–34.0)
MCHC: 32.4 g/dL (ref 30.0–36.0)
MCV: 83.1 fL (ref 80.0–100.0)
Platelets: 253 K/uL (ref 150–400)
RBC: 3.08 MIL/uL — ABNORMAL LOW (ref 4.22–5.81)
RDW: 19.2 % — ABNORMAL HIGH (ref 11.5–15.5)
WBC: 4.8 K/uL (ref 4.0–10.5)
nRBC: 0 % (ref 0.0–0.2)

## 2024-03-23 LAB — CULTURE, BLOOD (ROUTINE X 2)
Culture: NO GROWTH
Culture: NO GROWTH
Special Requests: ADEQUATE

## 2024-03-23 LAB — GLUCOSE, CAPILLARY: Glucose-Capillary: 70 mg/dL (ref 70–99)

## 2024-03-23 LAB — ECHO TEE

## 2024-03-23 SURGERY — TRANSESOPHAGEAL ECHOCARDIOGRAM (TEE) (CATHLAB)
Anesthesia: Monitor Anesthesia Care

## 2024-03-23 MED ORDER — GLYCOPYRROLATE PF 0.2 MG/ML IJ SOSY
PREFILLED_SYRINGE | INTRAMUSCULAR | Status: DC | PRN
  Administered 2024-03-23: .1 mg via INTRAVENOUS

## 2024-03-23 MED ORDER — MIDAZOLAM HCL (PF) 2 MG/2ML IJ SOLN
INTRAMUSCULAR | Status: AC | PRN
  Administered 2024-03-23 (×2): .5 mg via INTRAVENOUS

## 2024-03-23 MED ORDER — MIDAZOLAM HCL 2 MG/2ML IJ SOLN
INTRAMUSCULAR | Status: AC
  Filled 2024-03-23: qty 2

## 2024-03-23 MED ORDER — LIDOCAINE-EPINEPHRINE 1 %-1:100000 IJ SOLN
INTRAMUSCULAR | Status: AC
  Filled 2024-03-23: qty 1

## 2024-03-23 MED ORDER — SODIUM CHLORIDE 0.9 % IV SOLN: INTRAVENOUS | Status: DC

## 2024-03-23 MED ORDER — HEPARIN SODIUM (PORCINE) 1000 UNIT/ML IJ SOLN
INTRAMUSCULAR | Status: AC
  Filled 2024-03-23: qty 10

## 2024-03-23 MED ORDER — PROPOFOL 500 MG/50ML IV EMUL
INTRAVENOUS | Status: DC | PRN
  Administered 2024-03-23: 100 ug/kg/min via INTRAVENOUS

## 2024-03-23 MED ORDER — PROPOFOL 10 MG/ML IV BOLUS
INTRAVENOUS | Status: DC | PRN
  Administered 2024-03-23 (×2): 20 mg via INTRAVENOUS

## 2024-03-23 MED ORDER — LIDOCAINE 2% (20 MG/ML) 5 ML SYRINGE
INTRAMUSCULAR | Status: DC | PRN
  Administered 2024-03-23: 50 mg via INTRAVENOUS

## 2024-03-23 MED ORDER — FENTANYL CITRATE (PF) 100 MCG/2ML IJ SOLN
INTRAMUSCULAR | Status: AC
  Filled 2024-03-23: qty 2

## 2024-03-23 MED ORDER — FENTANYL CITRATE (PF) 100 MCG/2ML IJ SOLN
INTRAMUSCULAR | Status: AC | PRN
  Administered 2024-03-23 (×2): 25 ug via INTRAVENOUS

## 2024-03-23 MED ORDER — LIDOCAINE-EPINEPHRINE 1 %-1:100000 IJ SOLN
20.0000 mL | Freq: Once | INTRAMUSCULAR | Status: AC
  Administered 2024-03-23: 20 mL via INTRADERMAL
  Filled 2024-03-23: qty 20

## 2024-03-23 MED ORDER — ALUM & MAG HYDROXIDE-SIMETH 200-200-20 MG/5ML PO SUSP
30.0000 mL | ORAL | Status: DC | PRN
  Administered 2024-03-23: 30 mL via ORAL
  Filled 2024-03-23: qty 30

## 2024-03-23 NOTE — Consult Note (Addendum)
 Chief Complaint: Patient was seen in consultation today for vascular access problem s/p temporary HD catheter placement on 11/11, with consideration for conversion to tunneled line placement.  Referring Provider(s): Ms. Lamarr Boehringer, PA-C   Supervising Physician: Hughes Simmonds  Patient Status: [facility] - In-pt  Patient is Full Code  History of Present Illness: [name] is a 38 y.o. male  with PMHx notable for ESRD on hemodialysis, hard of hearing, HTN, GERD, CHF, bipolar 1 disorder, Alport syndrome, anemia, and others as delineated below.  Patient is known to IR, having received a temporary HD catheter yesterday on 03/22/2024 by Dr. Jenna.  Tunneled HD catheter line was deferred yesterday due to elevated potassium levels.  Patient is slated for discharge, and care team has requested conversion of temp HD line to tunneled.  Of note, patient is scheduled for a TEE this morning.  IR will tentatively proceed with conversion this afternoon, once patient is recovered from TEE procedure.  Patient is on enteric precautions.  Per care team, patient to be discharged imminently.  Patient is alert and laying in bed, calm.  However, patient appears upset, as he is being held n.p.o. for consecutive days.  He is otherwise without any significant complaints.  He does currently endorse a headache. Patient denies any fevers, chest pain, SOB, cough, abdominal pain, nausea, vomiting or bleeding.    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   Heart murmur    Hypertension    Low blood sugar    Marijuana abuse    Noncompliance    Pericardial effusion 03/28/2023   Tobacco abuse      Past Surgical History:  Procedure Laterality Date   A/V FISTULAGRAM Right 10/27/2022   Procedure: A/V Fistulagram;  Surgeon: Eliza Lonni RAMAN, MD;  Location: [facility] INVASIVE CV LAB;  Service: Cardiovascular;  Laterality: Right;   A/V FISTULAGRAM Left 01/27/2024   Procedure: A/V Fistulagram;  Surgeon: Lanis Fonda BRAVO, MD;  Location: [facility] INVASIVE CV LAB;  Service: Cardiovascular;  Laterality: Left;   APPENDECTOMY     AV FISTULA PLACEMENT Left 03/03/2019   Procedure: ARTERIOVENOUS (AV) FISTULA CREATION LEFT ARM;  Surgeon: Sheree Penne Lonni, MD;  Location: [facility] OR;  Service: Vascular;  Laterality: Left;   BASCILIC VEIN TRANSPOSITION Right 04/12/2019   Procedure: RIGHT UPPER EXTREMITY BASCILIC VEIN TRANSPOSITION FIRST STAGE FISTULA;  Surgeon: Eliza Lonni RAMAN, MD;  Location: [facility] OR;  Service: Vascular;  Laterality: Right;   BIOPSY  10/14/2021   Procedure: BIOPSY;  Surgeon: Legrand Victory LITTIE DOUGLAS, MD;  Location: [facility] ENDOSCOPY;  Service: Gastroenterology;;   BIOPSY  06/06/2022   Procedure: BIOPSY;  Surgeon: Shila Gustav GAILS, MD;  Location: [facility] ENDOSCOPY;  Service: Gastroenterology;;   BIOPSY  11/12/2022   Procedure: BIOPSY;  Surgeon: Wilhelmenia Aloha Raddle., MD;  Location: [facility] ENDOSCOPY;  Service: Gastroenterology;;   BIOPSY  12/05/2022   Procedure: BIOPSY;  Surgeon: Stacia Glendia BRAVO, MD;  Location: [facility] ENDOSCOPY;  Service: Gastroenterology;;   COLONOSCOPY WITH PROPOFOL  N/A 10/14/2021   Procedure: COLONOSCOPY WITH PROPOFOL ;  Surgeon: Legrand Victory LITTIE  III, MD;  Location: [facility] ENDOSCOPY;  Service: Gastroenterology;  Laterality: N/A;   DIALYSIS/PERMA CATHETER INSERTION N/A 04/03/2023   Procedure: DIALYSIS/PERMA CATHETER INSERTION;  Surgeon: Magda Debby SAILOR, MD;  Location: [facility] INVASIVE CV LAB;  Service: Cardiovascular;  Laterality: N/A;   ESOPHAGOGASTRODUODENOSCOPY N/A 11/12/2022   Procedure: ESOPHAGOGASTRODUODENOSCOPY (EGD);  Surgeon: Wilhelmenia Aloha Raddle., MD;  Location: [facility] ENDOSCOPY;  Service:  Gastroenterology;  Laterality: N/A;   ESOPHAGOGASTRODUODENOSCOPY N/A 12/05/2022   Procedure: ESOPHAGOGASTRODUODENOSCOPY (EGD);  Surgeon: Stacia Glendia BRAVO, MD;  Location: [facility] ENDOSCOPY;  Service: Gastroenterology;  Laterality: N/A;   ESOPHAGOGASTRODUODENOSCOPY (EGD) WITH PROPOFOL  N/A 06/06/2022   Procedure: ESOPHAGOGASTRODUODENOSCOPY (EGD) WITH PROPOFOL ;  Surgeon: Shila Gustav GAILS, MD;  Location: [facility] ENDOSCOPY;  Service: Gastroenterology;  Laterality: N/A;   FISTULA SUPERFICIALIZATION Right 11/07/2022   Procedure: PLICATION OF LARGE ANEURYSMS OF RIGHT ARTERIOVENOUS FISTULA WITH INSERTION OF 7mm INTERPOSITIONAL GORTEX GRAFT;  Surgeon: Eliza Lonni RAMAN, MD;  Location: [facility] OR;  Service: Vascular;  Laterality: Right;   FLEXIBLE SIGMOIDOSCOPY N/A 04/14/2022   Procedure: FLEXIBLE SIGMOIDOSCOPY;  Surgeon: Shila Gustav GAILS, MD;  Location: [facility] ENDOSCOPY;  Service: Gastroenterology;  Laterality: N/A;   INSERTION OF DIALYSIS CATHETER N/A 11/07/2022   Procedure: INSERTION OF RIGHT INTERNAL JUGULAR TUNNELED DIALYSIS CATHETER;  Surgeon: Eliza Lonni RAMAN, MD;  Location: [facility] OR;  Service: Vascular;  Laterality: N/A;   IR REMOVAL TUN CV CATH W/O FL  03/21/2024   IR TUNNELED CENTRAL VENOUS CATH PLC W IMG  03/22/2024   LIGATION OF ARTERIOVENOUS  FISTULA Left 03/06/2019   Procedure: LIGATION OF ARTERIOVENOUS  FISTULA;  Surgeon: Gretta Lonni PARAS, MD;  Location: [facility] OR;  Service: Vascular;  Laterality: Left;   PERIPHERAL VASCULAR BALLOON ANGIOPLASTY  10/27/2022   Procedure: PERIPHERAL VASCULAR BALLOON ANGIOPLASTY;  Surgeon: Eliza Lonni RAMAN, MD;  Location: [facility] INVASIVE CV LAB;  Service: Cardiovascular;;  rt upper arm fistula   SAVORY DILATION N/A 11/12/2022   Procedure: SAVORY DILATION;  Surgeon: Wilhelmenia Aloha Raddle., MD;  Location: [facility] ENDOSCOPY;  Service: Gastroenterology;  Laterality: N/A;   spinal tap     SPINE SURGERY     related to a spinal infection, unsure of surgery or infection source    TRANSESOPHAGEAL ECHOCARDIOGRAM (CATH LAB) N/A 04/02/2023   Procedure: TRANSESOPHAGEAL ECHOCARDIOGRAM;  Surgeon: Jeffrie Oneil BROCKS, MD;  Location: [facility] INVASIVE CV LAB;  Service: Cardiovascular;  Laterality: N/A;   WISDOM TOOTH EXTRACTION      Allergies: Nsaids, Zestril [lisinopril], and Risperdal  [risperidone ]  Medications: Prior to Admission medications   Medication Sig Start Date End Date Taking? Authorizing Provider  acetaminophen  (TYLENOL ) 500 MG tablet Take 1 tablet (500 mg total) by mouth every 6 (six) hours as needed for mild pain (pain score 1-3) or fever. 02/10/24   Will Almarie MATSU, MD  carvedilol  (COREG ) 25 MG tablet Take 1 tablet (25 mg total) by mouth 2 (two) times daily with a meal. 02/10/24   Will Almarie MATSU, MD  Cholecalciferol 1.25 MG (50000 UT) TABS Take 1 tablet by mouth once a week. For 8 weeks 02/23/24   Baloch, Mahnoor, MD  cinacalcet  (SENSIPAR ) 30 MG tablet Take 1 tablet (30 mg total) by mouth every Monday, Wednesday, and Friday at 6 PM. 02/10/24   Will Almarie MATSU, MD  clotrimazole  (LOTRIMIN ) 1 % cream Apply topically 2 (two) times daily. 02/10/24   Will Almarie MATSU, MD  divalproex  (DEPAKOTE  ER) 500 MG 24 hr tablet Take 1 tablet (500 mg total) by mouth daily. 04/04/23   Joshua Domino, DO  isosorbide  mononitrate (  ISMO ) 20 MG tablet Take 1 tablet (20 mg total) by mouth 2 (two) times daily. 02/10/24   Will Almarie MATSU, MD  losartan  (COZAAR ) 100 MG tablet Take 100 mg by mouth daily. 11/11/23   [provider]  minoxidil  (LONITEN ) 2.5 MG tablet Take 1 tablet (2.5 mg total) by mouth 2 (two) times daily. 02/10/24   Will Almarie MATSU, MD  NIFEdipine  (ADALAT  CC) 90 MG 24 hr tablet Take 90 mg by mouth in the morning and at bedtime.    [provider]  pantoprazole  (PROTONIX ) 40 MG tablet Take 1 tablet (40 mg total) by mouth daily. 12/28/23   Jenna Maude BRAVO, NP  sevelamer  carbonate (RENVELA ) 800 MG tablet Take 2 tablets (1,600 mg total) by mouth 3  (three) times daily with meals. 05/14/23   Cleotilde Lukes, DO  spironolactone  (ALDACTONE ) 100 MG tablet Take 1 tablet (100 mg total) by mouth daily. 02/11/24   Will Almarie MATSU, MD     Family History  Problem Relation Age of Onset   Hypertension Mother    Kidney failure Mother    Diabetes Mother    Hearing loss Father    Hypertension Sister    Heart disease Maternal Grandmother    Stroke Maternal Grandfather    Asthma Son     Social History   Socioeconomic History   Marital status: Married    Spouse name: Not on file   Number of children: 3   Years of education: Not on file   Highest education level: Not on file  Occupational History   Occupation: HVAC  Tobacco Use   Smoking status: Every Day    Current packs/day: 0.00    Types: Cigarettes    Last attempt to quit: 02/27/2016    Years since quitting: 8.0   Smokeless tobacco: Never   Tobacco comments:    Returned to smoking 2 black and milds per day  Vaping Use   Vaping status: Never Used  Substance and Sexual Activity   Alcohol use: Not Currently    Comment: rare   Drug use: Yes    Types: Marijuana   Sexual activity: Not Currently  Other Topics Concern   Not on file  Social History Narrative   Not on file   Social Drivers of Health   Financial Resource Strain: High Risk (03/15/2024)   Overall Financial Resource Strain (CARDIA)    Difficulty of Paying Living Expenses: Very hard  Food Insecurity: Food Insecurity Present (03/19/2024)   Hunger Vital Sign    Worried About Running Out of Food in the Last Year: Often true    Ran Out of Food in the Last Year: Sometimes true  Transportation Needs: Patient Declined (03/19/2024)   PRAPARE - Transportation    Lack of Transportation (Medical): Patient declined    Lack of Transportation (Non-Medical): Patient declined  Physical Activity: Inactive (06/02/2023)   Exercise Vital Sign    Days of Exercise per Week: 0 days    Minutes of Exercise per Session: 0 min  Stress:  Stress Concern Present (03/15/2024)   [employer] of Occupational Health - Occupational Stress Questionnaire    Feeling of Stress: To some extent  Social Connections: Moderately Isolated (03/19/2024)   Social Connection and Isolation Panel    Frequency of Communication with Friends and Family: More than three times a week    Frequency of Social Gatherings with Friends and Family: More than three times a week    Attends Religious Services: More than 4 times  per year    Active Member of Clubs or Organizations: No    Attends Banker Meetings: Never    Marital Status: Divorced     Review of Systems: A 12 point ROS discussed and pertinent positives are indicated in the HPI above.  All other systems are negative.  Vital Signs: BP (!) 219/109 (BP Location: Right Leg)   Pulse 83   Temp 98.1 F (36.7 C)   Resp 19   Ht 5' 8 (1.727 m)   Wt 158 lb 11.7 oz (72 kg)   SpO2 98%   BMI 24.14 kg/m   Advance Care Plan: The advanced care place/surrogate decision maker was discussed at the time of visit and the patient did not wish to discuss or was not able to name a surrogate decision maker or provide an advance care plan.  Physical Exam Vitals reviewed.  Constitutional:      General: He is not in acute distress.    Appearance: Normal appearance.  HENT:     Mouth/Throat:     Mouth: Mucous membranes are dry.  Neck:     Comments: Temporary HD catheter in place.  Large bulky dressing in place, as temporary catheter appears to be oozing blood. Cardiovascular:     Rate and Rhythm: Normal rate and regular rhythm.     Pulses: Normal pulses.     Heart sounds: Normal heart sounds.  Pulmonary:     Effort: Pulmonary effort is normal.     Breath sounds: Normal breath sounds.  Musculoskeletal:        General: Normal range of motion.     Cervical back: Normal range of motion.  Skin:    General: Skin is warm and dry.  Neurological:     Mental Status: [name] and oriented to  person, place, and time.  Psychiatric:        Mood and Affect: Mood normal.        Behavior: Behavior normal.        Thought Content: Thought content normal.        Judgment: Judgment normal.     Imaging: IR NON-TUNNELED CENTRAL VENOUS CATH [facility] W IMG Result Date: 03/22/2024 INDICATION: End-stage renal disease with recent bacteremia. EXAM: Temporary hemodialysis catheter FLUOROSCOPY: Radiation Exposure Index (as provided by the fluoroscopic device): 3 mGy Kerma COMPLICATIONS: None immediate. PROCEDURE: Informed written consent was obtained from the patient after a thorough discussion of the procedural risks, benefits and alternatives. All questions were addressed. Maximal Sterile Barrier Technique was utilized including caps, mask, sterile gowns, sterile gloves, sterile drape, hand hygiene and skin antiseptic. A timeout was performed prior to the initiation of the procedure. Patient was placed in supine position on the IR table. The right neck and chest were prepped and draped in usual sterile fashion. Ultrasound demonstrated the right internal jugular vein to be anechoic and compressible indicating patency. Local anesthesia was achieved with 1% lidocaine . Small incision was created overlying the jugular vein and then blunt dissection was performed to create a venotomy site. Micropuncture needle was advanced from the skin to the midpoint of the jugular vein under ultrasound guidance. A final image was obtained stored in patient's permanent medical record. Access was exchanged over an 018 guidewire. Measurements were obtained. A 16 cm Trialysis catheter was then prepped at the table. An 035 guidewire was advanced under fluoroscopic guidance and the micropuncture sheath was removed. The access site was dilated. The 16 cm Trialysis catheter was then advanced over  the guidewire with the distal tip being placed at the cavoatrial junction. Retention suture and sterile dressing applied. Catheter was tested for  function found to be functioning well. Catheter was then capped and flushed as per protocol. IMPRESSION: Satisfactory placement of a right internal jugular vein 16 cm Trialysis catheter. Catheter tip at the cavoatrial junction Electronically Signed   By: Cordella Banner   On: 03/22/2024 16:54   IR Removal Tun Cv Cath W/O FL Result Date: 03/22/2024 INDICATION: 38 year old male who presented to the ED on 03/18/2024 at the request of his nephrologist due to positive blood cultures. Request for tunneled dialysis catheter removal for line holiday. EXAM: REMOVAL OF TUNNELED HEMODIALYSIS CATHETER MEDICATIONS: 1% lidocaine  with epinephrine , 5 mL. COMPLICATIONS: None immediate. PROCEDURE: Informed written consent was obtained from the patient following an explanation of the procedure, risks, benefits and alternatives to treatment. A time out was performed prior to the initiation of the procedure. Sterile technique was utilized including mask, sterile gloves, sterile drape, and hand hygiene. Betadine  was used to prep the patient's right neck, chest and existing catheter. The catheter was removed intact by applying manual retraction. Hemostasis was obtained with manual compression. A dressing was placed. The patient tolerated the procedure well without immediate post procedural complication. IMPRESSION: Successful removal of tunneled hemodialysis catheter. Procedure performed by: Sherrilee Bal, PA-C under the supervision of Dr. VEAR Lent. Electronically Signed   By: Wilkie Lent M.D.   On: 03/22/2024 11:06   ECHOCARDIOGRAM COMPLETE Result Date: 03/20/2024    ECHOCARDIOGRAM REPORT   Patient Name:   [name] Date of Exam: 03/19/2024 Medical Rec #:  [id number]       Height:       68.0 in Accession #:    [id number]      Weight:       167.5 lb Date of Birth:  1985-09-12       BSA:          1.896 m Patient Age:    38 years        BP:           187/111 mmHg Patient Gender: M               HR:           84 bpm.  Exam Location:  Inpatient Procedure: 2D Echo, Cardiac Doppler and Color Doppler (Both Spectral and Color            Flow Doppler were utilized during procedure). Indications:    Bacteremia R78.81  History:        Patient has prior history of Echocardiogram examinations, most                 recent 01/30/2024. CHF, Migraines; Risk Factors:Hypertension and                 Current Smoker.  Sonographer:    Thea Norlander RCS Referring Phys: [facility] IMPRESSIONS  1. Left ventricular ejection fraction, by estimation, is 60 to 65%. The left ventricle has normal function. The left ventricle has no regional wall motion abnormalities. There is moderate left ventricular hypertrophy. Left ventricular diastolic parameters are indeterminate.  2. Right ventricular systolic function is normal. The right ventricular size is normal. There is mildly elevated pulmonary artery systolic pressure.  3. Left atrial size was moderately dilated.  4. Right atrial size was moderately dilated.  5. The mitral valve is normal in structure. Trivial mitral valve regurgitation. No evidence of  mitral stenosis.  6. The tricuspid valve is abnormal. Tricuspid valve regurgitation is moderate.  7. The aortic valve is tricuspid. Aortic valve regurgitation is not visualized. No aortic stenosis is present.  8. The inferior vena cava is dilated in size with >50% respiratory variability, suggesting right atrial pressure of 8 mmHg. FINDINGS  Left Ventricle: Left ventricular ejection fraction, by estimation, is 60 to 65%. The left ventricle has normal function. The left ventricle has no regional wall motion abnormalities. The left ventricular internal cavity size was normal in size. There is  moderate left ventricular hypertrophy. Left ventricular diastolic parameters are indeterminate. Right Ventricle: The right ventricular size is normal. Right vetricular wall thickness was not well visualized. Right ventricular systolic function is normal. There is  mildly elevated pulmonary artery systolic pressure. The tricuspid regurgitant velocity  is 2.74 m/s, and with an assumed right atrial pressure of 8 mmHg, the estimated right ventricular systolic pressure is 38.0 mmHg. Left Atrium: Left atrial size was moderately dilated. Right Atrium: Right atrial size was moderately dilated. Pericardium: There is no evidence of pericardial effusion. Mitral Valve: The mitral valve is normal in structure. Trivial mitral valve regurgitation. No evidence of mitral valve stenosis. Tricuspid Valve: The tricuspid valve is abnormal. Tricuspid valve regurgitation is moderate . No evidence of tricuspid stenosis. Aortic Valve: The aortic valve is tricuspid. Aortic valve regurgitation is not visualized. No aortic stenosis is present. Aortic valve mean gradient measures 8.6 mmHg. Aortic valve peak gradient measures 15.0 mmHg. Aortic valve area, by VTI measures 3.36  cm. Pulmonic Valve: The pulmonic valve was not well visualized. Pulmonic valve regurgitation is mild. No evidence of pulmonic stenosis. Aorta: The aortic root and ascending aorta are structurally normal, with no evidence of dilitation. Venous: The inferior vena cava is dilated in size with greater than 50% respiratory variability, suggesting right atrial pressure of 8 mmHg. IAS/Shunts: No atrial level shunt detected by color flow Doppler.  LEFT VENTRICLE PLAX 2D LVIDd:         5.10 cm   Diastology LVIDs:         3.50 cm   LV e' medial:    6.98 cm/s LV PW:         1.30 cm   LV E/e' medial:  16.8 LV IVS:        1.30 cm   LV e' lateral:   13.10 cm/s LVOT diam:     2.20 cm   LV E/e' lateral: 8.9 LV SV:         113 LV SV Index:   60 LVOT Area:     3.80 cm  RIGHT VENTRICLE            IVC RV S prime:     8.93 cm/s  IVC diam: 2.90 cm TAPSE (M-mode): 1.8 cm LEFT ATRIUM           Index        RIGHT ATRIUM           Index LA diam:      4.70 cm 2.48 cm/m   RA Area:     24.80 cm LA Vol (A2C): 73.1 ml 38.56 ml/m  RA Volume:   79.30 ml  41.83  ml/m LA Vol (A4C): 82.7 ml 43.63 ml/m  AORTIC VALVE AV Area (Vmax):    3.09 cm AV Area (Vmean):   2.94 cm AV Area (VTI):     3.36 cm AV Vmax:  193.42 cm/s AV Vmean:          138.357 cm/s AV VTI:            0.336 m AV Peak Grad:      15.0 mmHg AV Mean Grad:      8.6 mmHg LVOT Vmax:         157.00 cm/s LVOT Vmean:        107.000 cm/s LVOT VTI:          0.297 m LVOT/AV VTI ratio: 0.88  AORTA Ao Root diam: 3.60 cm Ao Asc diam:  3.00 cm MITRAL VALVE                TRICUSPID VALVE MV Area (PHT): 3.85 cm     TR Peak grad:   30.0 mmHg MV Decel Time: 197 msec     TR Vmax:        274.00 cm/s MV E velocity: 117.00 cm/s MV A velocity: 46.60 cm/s   SHUNTS MV E/A ratio:  2.51         Systemic VTI:  0.30 m                             Systemic Diam: 2.20 cm Dorn Ross MD Electronically signed by Dorn Ross MD Signature Date/Time: 03/20/2024/1:32:04 PM    Final     Labs:  CBC: Recent Labs    03/20/24 1536 03/21/24 0110 03/21/24 0447 03/22/24 0428 03/23/24 0600  WBC 5.5  --  6.0 6.0 4.8  HGB 6.9* 7.3* 7.9* 8.7* 8.3*  HCT 22.3* 22.9* 24.5* 26.8* 25.6*  PLT 278  --  262 258 253    COAGS: Recent Labs    03/27/23 1828  INR 1.2  APTT 36    BMP: Recent Labs    03/20/24 0255 03/21/24 0110 03/22/24 0428 03/22/24 1129 03/23/24 0600  NA 132* 136 134*  --  134*  K 4.1 4.7 6.2* 6.5* 4.0  CL 93* 98 96*  --  92*  CO2 21* 20* 18*  --  24  GLUCOSE 69* 107* 79  --  69*  BUN 50* 61* 75*  --  41*  CALCIUM  8.2* 8.5* 8.8*  --  8.6*  CREATININE 12.41* 13.88* 15.43*  --  9.62*  GFRNONAA 5* 4* 4*  --  7*    LIVER FUNCTION TESTS: Recent Labs    08/30/23 2153 12/26/23 2036 01/20/24 2100 01/22/24 0757 03/18/24 0210 03/19/24 0436 03/20/24 0255 03/21/24 0110 03/22/24 0428 03/23/24 0600  BILITOT 0.7 1.0 0.9  --  0.7  --   --   --   --   --   AST 23 23 26   --  21  --   --   --   --   --   ALT 19 15 13   --  20  --   --   --   --   --   ALKPHOS 125 138* 139*  --  164*  --   --   --    --   --   PROT 7.1 7.5 6.7  --  7.9  --   --   --   --   --   ALBUMIN  3.8 3.6 2.8*   < > 3.1*   < > 2.5* 2.5* 3.0* 2.8*   < > = values in this interval not displayed.    TUMOR MARKERS: No results for input(s): AFPTM, CEA, CA199,  CHROMGRNA in the last 8760 hours.  Assessment and Plan: Patient with ESRD, on HD, with a nonfunctioning fistula.  Patient was scheduled for tunneled HD line yesterday, but received a temp line due to abnormal labs, preventing sedation.  Patient is tentatively scheduled for temporary to tunneled line conversion and IR today.  Patient has been NPO since midnight for TEE procedure scheduled this morning.  IR requested patient to maintain n.p.o. status status post TEE procedure, for conversion of temporary to tunneled HD line, under moderate sedation. All labs and medications are within acceptable parameters.  Patient is maintained on enteric precautions. Per care team, plan to discharge patient imminently. Allergies reviewed: NSAIDs; Zestril.  Risks and benefits discussed with the patient including, but not limited to bleeding, infection, vascular injury, pneumothorax which may require chest tube placement, air embolism or even death  All of the patient's questions were answered, patient is agreeable to proceed. Consent signed and in chart.    Thank you for allowing our service to participate in [name] 's care.  Electronically Signed: Carlin DELENA Griffon, PA-C   03/23/2024, 9:54 AM      I spent a total of 40 Minutes in face to face in clinical consultation, greater than 50% of which was counseling/coordinating care for vascular access problem s/p temporary HD catheter placement on 11/11, with consideration for conversion to tunneled line placement.

## 2024-03-23 NOTE — Assessment & Plan Note (Addendum)
 HD MWF. Potassium 4.0, IR will plan permanent cath replacement today - Nephrology following, appreciate recommendations - Oxycodone  5-10mg  q6 prn breakthrough pain from catheter removal site - Renvela  1600 mg TID with meals - Cinacalcet  30 mg MWF - Micera 150 mcg q2 weeks (last 11/3) - Monitor hemodynamic status closely  - Transfusion threshold 7, transfuse prn

## 2024-03-23 NOTE — Plan of Care (Signed)
 Went to round on patient to complete a post-procedure assessment following his TEE earlier today. Patient was not in his room or bathroom. Nursing staff reported he was escorted to the cafeteria. Patient is C. diff positive and on enteric precautions placed 11/10.

## 2024-03-23 NOTE — Transfer of Care (Signed)
 Immediate Anesthesia Transfer of Care Note  Patient: [name]  Procedure(s) Performed: TRANSESOPHAGEAL ECHOCARDIOGRAM  Patient Location: PACU  Anesthesia Type:MAC  Level of Consciousness: sedated  Airway & Oxygen Therapy: Patient Spontanous Breathing and Patient connected to face mask oxygen  Post-op Assessment: Report given to RN and Post -op Vital signs reviewed and stable  Post vital signs: Reviewed and stable  Last Vitals:  Vitals Value Taken Time  BP    Temp    Pulse    Resp    SpO2      Last Pain:  Vitals:   03/23/24 1120  TempSrc:   PainSc: 0-No pain      Patients Stated Pain Goal: 0 (03/23/24 0430)  Complications: No notable events documented.

## 2024-03-23 NOTE — Assessment & Plan Note (Addendum)
 Per ID, TEE at 1000  - ID consulted, appreciate recommendations - NPO at midnight - Abx: IV Cefepime  1g daily (11/7- ) - Pain: Tylenol  1000 mg q6h PRN - Labs: AM RFP, CBC

## 2024-03-23 NOTE — Interval H&P Note (Signed)
 History and Physical Interval Note:  03/23/2024 11:35 AM  [name]  has presented today for surgery, with the diagnosis of Bacteremia.  The various methods of treatment have been discussed with the patient and family. After consideration of risks, benefits and other options for treatment, the patient has consented to  Procedure(s): TRANSESOPHAGEAL ECHOCARDIOGRAM (N/A) as a surgical intervention.  The patient's history has been reviewed, patient examined, no change in status, stable for surgery.  I have reviewed the patient's chart and labs.  Questions were answered to the patient's satisfaction.     Amarri Satterly

## 2024-03-23 NOTE — Progress Notes (Signed)
 [name] Progress Note   Subjective:  Seen in room. S/p HD overnight last night. Ended up getting a non-tunneled HD line yesterday due to hyperkalemia despite 2 doses of Lokelma . K is much better today - IR has been consulted for conversion temp cath to tunneled cath. Also going for TEE at some point today. Denies CP/dyspnea.  Objective Vitals:   03/23/24 0330 03/23/24 0453 03/23/24 0838 03/23/24 0958  BP: (!) 228/111 (!) 178/127 (!) 219/109 (!) 197/104  Pulse: 88 88 83 84  Resp: 16  19 16   Temp: 98.1 F (36.7 C) 97.8 F (36.6 C) 98.1 F (36.7 C) 98.1 F (36.7 C)  TempSrc: Oral   Oral  SpO2: 99% 98% 98% 98%  Weight: 72 kg     Height:       Physical Exam General: Well appearing, NAD. Deaf, reads lips Heart: RRR, no murmur Lungs: CTAB Abdomen: soft Extremities: no LE edema Dialysis Access: non-tunneled R internal jugular line. LUE AVG + bruit (cannot be successfully cannulated)  Additional Objective Labs: Basic Metabolic Panel: Recent Labs  Lab 03/21/24 0110 03/22/24 0428 03/22/24 1129 03/23/24 0600  NA 136 134*  --  134*  K 4.7 6.2* 6.5* 4.0  CL 98 96*  --  92*  CO2 20* 18*  --  24  GLUCOSE 107* 79  --  69*  BUN 61* 75*  --  41*  CREATININE 13.88* 15.43*  --  9.62*  CALCIUM  8.5* 8.8*  --  8.6*  PHOS 6.7* 7.3*  --  4.8*   Liver Function Tests: Recent Labs  Lab 03/18/24 0210 03/19/24 0436 03/21/24 0110 03/22/24 0428 03/23/24 0600  AST 21  --   --   --   --   ALT 20  --   --   --   --   ALKPHOS 164*  --   --   --   --   BILITOT 0.7  --   --   --   --   PROT 7.9  --   --   --   --   ALBUMIN  3.1*   < > 2.5* 3.0* 2.8*   < > = values in this interval not displayed.   CBC: Recent Labs  Lab 03/20/24 0255 03/20/24 0700 03/20/24 1536 03/21/24 0110 03/21/24 0447 03/22/24 0428 03/23/24 0600  WBC 5.7  --  5.5  --  6.0 6.0 4.8  HGB 6.2*   < > 6.9*   < > 7.9* 8.7* 8.3*  HCT 19.8*   < > 22.3*   < > 24.5* 26.8* 25.6*  MCV 82.2  --  83.2   --  81.9 83.0 83.1  PLT 248  --  278  --  262 258 253   < > = values in this interval not displayed.   Studies/Results: IR NON-TUNNELED CENTRAL VENOUS CATH [facility] W IMG Result Date: 03/22/2024 INDICATION: End-stage renal disease with recent bacteremia. EXAM: Temporary hemodialysis catheter FLUOROSCOPY: Radiation Exposure Index (as provided by the fluoroscopic device): 3 mGy Kerma COMPLICATIONS: None immediate. PROCEDURE: Informed written consent was obtained from the patient after a thorough discussion of the procedural risks, benefits and alternatives. All questions were addressed. Maximal Sterile Barrier Technique was utilized including caps, mask, sterile gowns, sterile gloves, sterile drape, hand hygiene and skin antiseptic. A timeout was performed prior to the initiation of the procedure. Patient was placed in supine position on the IR table. The right neck and chest were prepped and draped in usual sterile  fashion. Ultrasound demonstrated the right internal jugular vein to be anechoic and compressible indicating patency. Local anesthesia was achieved with 1% lidocaine . Small incision was created overlying the jugular vein and then blunt dissection was performed to create a venotomy site. Micropuncture needle was advanced from the skin to the midpoint of the jugular vein under ultrasound guidance. A final image was obtained stored in patient's permanent medical record. Access was exchanged over an 018 guidewire. Measurements were obtained. A 16 cm Trialysis catheter was then prepped at the table. An 035 guidewire was advanced under fluoroscopic guidance and the micropuncture sheath was removed. The access site was dilated. The 16 cm Trialysis catheter was then advanced over the guidewire with the distal tip being placed at the cavoatrial junction. Retention suture and sterile dressing applied. Catheter was tested for function found to be functioning well. Catheter was then capped and flushed as per  protocol. IMPRESSION: Satisfactory placement of a right internal jugular vein 16 cm Trialysis catheter. Catheter tip at the cavoatrial junction Electronically Signed   By: Cordella Banner   On: 03/22/2024 16:54   Medications:  sodium chloride       ceFAZolin  (ANCEF ) IV     ceFEPime  (MAXIPIME ) IV 1 g (03/22/24 1142)    calcitRIOL   1 mcg Oral Q M,W,F-HD   carvedilol   25 mg Oral BID   Chlorhexidine  Gluconate Cloth  6 each Topical Q0600   cinacalcet   30 mg Oral Q M,W,F-1800   divalproex   500 mg Oral Daily   famotidine   10 mg Oral Daily   heparin   5,000 Units Subcutaneous Q8H   heparin  sodium (porcine)  10 mL Intracatheter Once   isosorbide -hydrALAZINE   1 tablet Oral TID   losartan   100 mg Oral Daily   minoxidil   2.5 mg Oral BID   NIFEdipine   90 mg Oral BID   pantoprazole   40 mg Oral Daily   sevelamer  carbonate  2,400 mg Oral TID WC   vancomycin   125 mg Oral QID   Dialysis Orders MWFSa - NW 4:00 Nipro EDW 72.5kg 2K/2Ca AVG/TDC  - No bolus heparin  order - Mircera 150 mcg q 2 weeks (last 11/3) - Calcitriol  1.0 q HD; Sensipar  30 q HD, sevelamer  800 2 q meals  - Last HD 11/5 -- post HD wt 77.4 kg    Assessment/Plan: Klebsiella bacteremia. Blood Cx 11/3 from OP HD unit grew Klebsiella oxytoca. Prior Klebsiella bacteremia in October and completed course of cefepime . Repeat BCx 11/7, 11/9 NGTD. On cefepime . No vegetation on TTE, for TEE today.  Catheter removed for line holiday on 11/9, new temp line 11/11. ESRD. Now 4x/week HD as outpatient - next HD tomorrow. IR re-consulted to convert temp to tunneled line. Access. L arm AVG placed at [facility] 11/2023. Graft patent on f'gram in Sept. Have been unable to cannulate graft successfully to date. Appreciate VVS input - no need for another shuntogram. Will most likely need new perm access. F/u as outpatient.  Hypertension/volume. Improving with meds and HD, UF as tolerated (remains well above dry weight) Anemia. Hgb 8.3. Due for ESA on 11/17. S/p 1U  PRBCs 11/9.   Metabolic bone disease.  Ca ok, Phos much better - continue home binders + VDRA + sensipar . Nutrition. On regular diet with fluid restriction. Counseled on low K diet choices. May need to add Lokelma . C.diff. On PO Vanc.    Izetta Boehringer, PA-C 03/23/2024, 10:52 AM  [employer]

## 2024-03-23 NOTE — Assessment & Plan Note (Signed)
 BP 178/127 - Continuing home meds: Carvedilol  25 mg BID, losartan  100 mg daily, nifedipine  90 mg BID, spironolactone  100 mg daily, minoxidil  2.5 mg BID - Nephrology recommended hydralazine , to reduce pill burden BiDil 20-37.5 mg started, home med isosorbide  dinitrate discontinued - Monitor closely

## 2024-03-23 NOTE — Procedures (Signed)
 Vascular and Interventional Radiology Procedure Note  Patient: [name] DOB: 24-Jul-1985 Medical Record Number: [id number] Note Date/Time: 03/23/24 4:39 PM   Performing Physician: Thom Hall, MD Assistant(s): None  Diagnosis: ESRD requiring Hemodialysis  Procedure: TUNNELED HEMODIALYSIS CATHETER PLACEMENT  Anesthesia: Conscious Sedation Complications: None Estimated Blood Loss: Minimal Specimens:  None  Findings:  Successful placement of right-sided, 19 cm (tip-to-cuff), tunneled hemodialysis catheter with the tip of the catheter in the proximal right atrium.  Plan: Catheter ready for use.  See detailed procedure note with images in PACS. The patient tolerated the procedure well without incident or complication and was returned to Recovery in stable condition.    Thom Hall, MD [facility] Radiology   Pager. [phone] Clinic. [phone]

## 2024-03-23 NOTE — Assessment & Plan Note (Addendum)
 Last bout of diarrhea yesterday. No BM since. End date Vanco 11/19, f/u with ID s/p TEE - C diff positive - Enteric precautions in place - Vancomycin  125mg  daily per pharm (11/9-) End date 11/19

## 2024-03-23 NOTE — Anesthesia Postprocedure Evaluation (Signed)
 Anesthesia Post Note  Patient: [name]  Procedure(s) Performed: TRANSESOPHAGEAL ECHOCARDIOGRAM     Patient location during evaluation: PACU Anesthesia Type: MAC Level of consciousness: awake and alert Pain management: pain level controlled Vital Signs Assessment: post-procedure vital signs reviewed and stable Respiratory status: spontaneous breathing, nonlabored ventilation and respiratory function stable Cardiovascular status: blood pressure returned to baseline Postop Assessment: no apparent nausea or vomiting Anesthetic complications: no   No notable events documented.  Last Vitals:  Vitals:   03/23/24 1220 03/23/24 1227  BP: (!) 178/87 (!) 170/88  Pulse:    Resp: 16 16  Temp:    SpO2: 93% 94%    Last Pain:  Vitals:   03/23/24 1208  TempSrc:   PainSc: Asleep                 Vertell Row

## 2024-03-23 NOTE — Assessment & Plan Note (Signed)
 No concern for infection at this time given exam and patient's report. - Will have patient follow-up outpatient for further management

## 2024-03-23 NOTE — Plan of Care (Signed)
 Permanent HD cath placed. HD tomorrow and then d/c. BP elevated but pt does insist on it be taken on his leg.   Problem: Education: Goal: Knowledge of General Education information will improve Description: Including pain rating scale, medication(s)/side effects and non-pharmacologic comfort measures Outcome: Progressing   Problem: Health Behavior/Discharge Planning: Goal: Ability to manage health-related needs will improve Outcome: Progressing   Problem: Clinical Measurements: Goal: Ability to maintain clinical measurements within normal limits will improve Outcome: Progressing Goal: Will remain free from infection Outcome: Progressing Goal: Diagnostic test results will improve Outcome: Progressing Goal: Respiratory complications will improve Outcome: Progressing Goal: Cardiovascular complication will be avoided Outcome: Progressing   Problem: Nutrition: Goal: Adequate nutrition will be maintained Outcome: Progressing   Problem: Coping: Goal: Level of anxiety will decrease Outcome: Progressing   Problem: Elimination: Goal: Will not experience complications related to bowel motility Outcome: Progressing Goal: Will not experience complications related to urinary retention Outcome: Progressing

## 2024-03-23 NOTE — Progress Notes (Signed)
 PT Cancellation Note  Patient Details Name: [name] MRN: [id number] DOB: Sep 26, 1985   Cancelled Treatment:    Reason Eval/Treat Not Completed: Patient at procedure or test/unavailable  Will follow up later today as time allows;  Otherwise, will follow up for PT tomorrow;   Thank you,  Silvano Currier, PT  Acute Rehabilitation Services Office [phone]    [name] 03/23/2024, 2:57 PM

## 2024-03-23 NOTE — Progress Notes (Signed)
 Received patient in bed to unit.  Alert and oriented.  Informed consent signed and in chart.   TX duration: 3.5  Patient tolerated well.  Transported back to the room  Alert, without acute distress. Hand-off given to patient's   RN   Access used: Dialysis catheter Access issues: Blood pooled under catheter dressing, dressing reinforced with pressure dressing  Total UF removed: 4000 Used 1K for 2 hours per Provider due K  .5 Medication(s) given: None Post HD VS: t98.8-YM11 -MM83 B/P228/111 Post HD weight: 72 kg  Neville Seip, RN Kidney Dialysis Unit   03/23/24 0330  Vitals  Temp 98.1 F (36.7 C)  Temp Source Oral  BP (!) 228/111  BP Location Right Leg  BP Method Automatic  Patient Position (if appropriate) Lying  Pulse Rate 88  Pulse Rate Source Monitor  ECG Heart Rate 88  Resp 16  Weight 72 kg  Type of Weight Post-Dialysis  Oxygen Therapy  SpO2 99 %  O2 Device Room Air  Patient Activity (if Appropriate) In bed  Pulse Oximetry Type Continuous  During Treatment Monitoring  Blood Flow Rate (mL/min) 0 mL/min  Arterial Pressure (mmHg) -44.24 mmHg  Venous Pressure (mmHg) 22.42 mmHg  TMP (mmHg) 27.67 mmHg  Ultrafiltration Rate (mL/min) 1669 mL/min  Dialysate Flow Rate (mL/min) 300 ml/min  Dialysate Potassium Concentration 2K  Dialysate Calcium  Concentration 2.5  Duration of HD Treatment -hour(s) 1.5 hour(s)  Cumulative Fluid Removed (mL) per Treatment  2000.3  HD Safety Checks Performed Yes  Intra-Hemodialysis Comments Tolerated well;Tx completed  Hemodialysis Catheter Right Internal jugular Triple lumen Temporary (Non-Tunneled)  Placement Date/Time: 03/22/24 1627   Serial / Lot #: [id number]  Expiration Date: 12/10/27  Time Out: Correct patient;Correct site;Correct procedure  Maximum sterile barrier precautions: Hand hygiene;Cap;Mask;Sterile gown;Sterile gloves;Large sterile s...  Site Condition Other (Comment)  Blue Lumen Status Flushed;Saline locked;Antimicrobial  dead end cap  Red Lumen Status Flushed;Antimicrobial dead end cap;Saline locked  Purple Lumen Status N/A  Catheter fill solution Other (Comment)  Catheter fill volume (Arterial) 1.3 cc  Catheter fill volume (Venous) 1.3  Dressing Type Transparent  Dressing Status  (Bloody)  Interventions Dressing reinforced  Post treatment catheter status Capped and Clamped

## 2024-03-23 NOTE — Assessment & Plan Note (Signed)
 Alport syndrome: Bilateral sensorineural hearing loss Anemia of Chronic Disease: S/p ESA outpatient 11/3. Continue to optimize with nephrology. Hgb of 7.5 this am, stable from 7.2 yesterday. Bipolar 1 disorder: Continue Depakote  500 mg daily Migraines: Continue Depakote  500 mg daily GERD: Continue Protonix  40 mg daily HFpEF: EF 60 to 65%, no RVMA, mildly dilated pulmonary artery with normal PAP

## 2024-03-23 NOTE — Progress Notes (Signed)
 OT Cancellation Note  Patient Details Name: MILAD BUBLITZ MRN: [id number] DOB: 1986/04/24   Cancelled Treatment:    Reason Eval/Treat Not Completed:  (Per nursing going out and to wait.)  Monita Swier K OTR/L  Acute Rehab Services  [phone] office number   Warrick Berber 03/23/2024, 9:32 AM

## 2024-03-23 NOTE — Progress Notes (Signed)
 Daily Progress Note Intern Pager: [phone]  Patient name: [name] Medical record number: [id number] Date of birth: [birth year] Age: 38 y.o. Gender: male  Primary Care Provider: Lonnie Earnest, MD Consultants: Nephrology Code Status: Full Code, confirmed with patient  Pt Overview and Major Events to Date:  11/7: Admitted to FMTS 11/9: Dialysis cath removed 11/12: Delayed cath replacement due to elevated potassium levels, temp cath placed  Assessment and Plan: [name] is a 38 year old male with past medical history of Alport syndrome bilateral hearing loss, HTN, bipolar disorder, HF, ESRD on HD MWF.  Admitted for chills positive BCx from outpatient HD showing Klebsiella oxytoca, with suspected source of infection from HD catheter.  Bleeding from temp catheter site during HD yesterday, no active bleeding today. TEE per ID and permanent cath placement today.  Assessment & Plan Klebsiella Bacteremia Per ID, TEE at 1000  - ID consulted, appreciate recommendations - NPO at midnight - Abx: IV Cefepime  1g daily (11/7- ) - Pain: Tylenol  1000 mg q6h PRN - Labs: AM RFP, CBC End-stage renal disease on hemodialysis (HCC) Anemia associated with chronic renal failure HD MWF. Potassium 4.0, IR will plan permanent cath replacement today - Nephrology following, appreciate recommendations - Oxycodone  5-10mg  q6 prn breakthrough pain from catheter removal site - Renvela  1600 mg TID with meals - Cinacalcet  30 mg MWF - Micera 150 mcg q2 weeks (last 11/3) - Monitor hemodynamic status closely  - Transfusion threshold 7, transfuse prn Resistant hypertension BP 178/127 - Continuing home meds: Carvedilol  25 mg BID, losartan  100 mg daily, nifedipine  90 mg BID, spironolactone  100 mg daily, minoxidil  2.5 mg BID - Nephrology recommended hydralazine , to reduce pill burden BiDil 20-37.5 mg started, home med isosorbide  dinitrate discontinued - Monitor closely Diarrhea C. difficile  diarrhea Last bout of diarrhea yesterday. No BM since. End date Vanco 11/19, f/u with ID s/p TEE - C diff positive - Enteric precautions in place - Vancomycin  125mg  daily per pharm (11/9-) End date 11/19 Mass of skin of right shoulder No concern for infection at this time given exam and patient's report. - Will have patient follow-up outpatient for further management Chronic health problem Alport syndrome: Bilateral sensorineural hearing loss Anemia of Chronic Disease: S/p ESA outpatient 11/3. Continue to optimize with nephrology. Hgb of 7.5 this am, stable from 7.2 yesterday. Bipolar 1 disorder: Continue Depakote  500 mg daily Migraines: Continue Depakote  500 mg daily GERD: Continue Protonix  40 mg daily  HFpEF: EF 60 to 65%, no RVMA, mildly dilated pulmonary artery with normal PAP   FEN/GI: NPO PPx: Heparin  Dispo:Home today  Subjective:  Found sitting upright in bed, states laying down hurts due to temporary cath and reports continued bleeding from cath placement. Dried blood on bandages around cath, no active bleeding on examination. Asked about when he can eat and discharge, consulted he can eat after TEE and permanent cath placement today.  Objective: Temp:  [97.5 F (36.4 C)-98.2 F (36.8 C)] 97.8 F (36.6 C) (11/12 0453) Pulse Rate:  [65-88] 88 (11/12 0453) Resp:  [14-20] 16 (11/12 0330) BP: (142-267)/(49-140) 178/127 (11/12 0453) SpO2:  [93 %-100 %] 98 % (11/12 0453) Weight:  [72 kg-76 kg] 72 kg (11/12 0330)  Physical Exam: General: Well-appearing, no acute distress Cardio: Regular rate, regular  rhythm, no murmurs on exam. Pulm: Clear, no wheezing, no crackles. No increased work of breathing Abdominal: bowel sounds present, soft, non-tender, non-distended. Temporary cath with bloody bandages on RUE Extremities: no peripheral edema  Laboratory: Most recent CBC Lab Results  Component Value Date   WBC 4.8 03/23/2024   HGB 8.3 (L) 03/23/2024   HCT 25.6 (L)  03/23/2024   MCV 83.1 03/23/2024   PLT 253 03/23/2024   Most recent BMP    Latest Ref Rng & Units 03/23/2024    6:00 AM  BMP  Glucose 70 - 99 mg/dL 69   BUN 6 - 20 mg/dL 41   Creatinine 9.38 - 1.24 mg/dL 0.37   Sodium 864 - 854 mmol/L 134   Potassium 3.5 - 5.1 mmol/L 4.0   Chloride 98 - 111 mmol/L 92   CO2 22 - 32 mmol/L 24   Calcium  8.9 - 10.3 mg/dL 8.6   BUN: 41, Cr: 0.37  Edward Palma, MD 03/23/2024, 7:58 AM  PGY-1, [facility] FPTS Intern pager: [phone], text pages welcome Secure chat group [facility] Teaching Service

## 2024-03-23 NOTE — CV Procedure (Signed)
    TRANSESOPHAGEAL ECHOCARDIOGRAM   NAME:  [name]    MRN: [id number] DOB:  1986/04/06    ADMIT DATE: 03/18/2024  INDICATIONS: Bacteremia  PROCEDURE:   Informed consent was obtained prior to the procedure. The risks, benefits and alternatives for the procedure were discussed and the patient comprehended these risks.  Risks include, but are not limited to, cough, sore throat, vomiting, nausea, somnolence, esophageal and stomach trauma or perforation, bleeding, low blood pressure, aspiration, pneumonia, infection, trauma to the teeth and death.    Procedural time out performed. The oropharynx was anesthetized with viscous lidocaine .  Anesthesia was administered by the anaesthesilogy team to achieve and maintain moderate to deep conscious sedation.  The patient's heart rate, blood pressure, and oxygen saturation were monitored continuously during the procedure.  The transesophageal probe was inserted in the esophagus and stomach without difficulty and multiple views were obtained.   The patient tolerated the procedure well.  COMPLICATIONS:    There were no immediate complications.  KEY FINDINGS:  Normal ejection fraction, mild tricuspid valve prolapse.  Mild to moderate regurgitation.  Full report to follow. Further management per primary team.   Ameah Chanda, DO [facility]  [facility]  12:52 PM

## 2024-03-24 ENCOUNTER — Other Ambulatory Visit (HOSPITAL_COMMUNITY): Payer: Self-pay

## 2024-03-24 DIAGNOSIS — A0472 Enterocolitis due to Clostridium difficile, not specified as recurrent: Secondary | ICD-10-CM | POA: Diagnosis not present

## 2024-03-24 DIAGNOSIS — Q8781 Alport syndrome: Secondary | ICD-10-CM | POA: Diagnosis not present

## 2024-03-24 DIAGNOSIS — N186 End stage renal disease: Secondary | ICD-10-CM | POA: Diagnosis not present

## 2024-03-24 DIAGNOSIS — B961 Klebsiella pneumoniae [K. pneumoniae] as the cause of diseases classified elsewhere: Secondary | ICD-10-CM | POA: Diagnosis not present

## 2024-03-24 DIAGNOSIS — R7881 Bacteremia: Secondary | ICD-10-CM | POA: Diagnosis not present

## 2024-03-24 LAB — CBC
HCT: 25.6 % — ABNORMAL LOW (ref 39.0–52.0)
Hemoglobin: 8.2 g/dL — ABNORMAL LOW (ref 13.0–17.0)
MCH: 26.9 pg (ref 26.0–34.0)
MCHC: 32 g/dL (ref 30.0–36.0)
MCV: 83.9 fL (ref 80.0–100.0)
Platelets: 221 K/uL (ref 150–400)
RBC: 3.05 MIL/uL — ABNORMAL LOW (ref 4.22–5.81)
RDW: 19.5 % — ABNORMAL HIGH (ref 11.5–15.5)
WBC: 6 K/uL (ref 4.0–10.5)
nRBC: 0 % (ref 0.0–0.2)

## 2024-03-24 LAB — RENAL FUNCTION PANEL
Albumin: 2.8 g/dL — ABNORMAL LOW (ref 3.5–5.0)
Anion gap: 15 (ref 5–15)
BUN: 52 mg/dL — ABNORMAL HIGH (ref 6–20)
CO2: 24 mmol/L (ref 22–32)
Calcium: 8.7 mg/dL — ABNORMAL LOW (ref 8.9–10.3)
Chloride: 94 mmol/L — ABNORMAL LOW (ref 98–111)
Creatinine, Ser: 12.29 mg/dL — ABNORMAL HIGH (ref 0.61–1.24)
GFR, Estimated: 5 mL/min — ABNORMAL LOW (ref >60)
Glucose, Bld: 101 mg/dL — ABNORMAL HIGH (ref 70–99)
Phosphorus: 6.5 mg/dL — ABNORMAL HIGH (ref 2.5–4.6)
Potassium: 5 mmol/L (ref 3.5–5.1)
Sodium: 133 mmol/L — ABNORMAL LOW (ref 135–145)

## 2024-03-24 MED ORDER — VANCOMYCIN HCL 125 MG PO CAPS
ORAL_CAPSULE | ORAL | 0 refills | Status: DC
  Filled ????-??-??: fill #0

## 2024-03-24 MED ORDER — OXYCODONE HCL 5 MG PO TABS
5.0000 mg | ORAL_TABLET | Freq: Four times a day (QID) | ORAL | 0 refills | Status: DC | PRN
  Filled 2024-03-24: qty 10, 3d supply, fill #0

## 2024-03-24 MED ORDER — SEVELAMER CARBONATE 800 MG PO TABS: 2400.0000 mg | ORAL_TABLET | Freq: Three times a day (TID) | ORAL | 0 refills | Status: AC

## 2024-03-24 MED ORDER — DIPHENHYDRAMINE HCL 25 MG PO CAPS
ORAL_CAPSULE | ORAL | Status: AC
  Filled 2024-03-24: qty 1

## 2024-03-24 MED ORDER — DIPHENHYDRAMINE HCL 25 MG PO CAPS
25.0000 mg | ORAL_CAPSULE | Freq: Once | ORAL | Status: AC
  Administered 2024-03-24: 25 mg via ORAL

## 2024-03-24 MED ORDER — HEPARIN SODIUM (PORCINE) 1000 UNIT/ML IJ SOLN
INTRAMUSCULAR | Status: AC
  Filled 2024-03-24: qty 4

## 2024-03-24 MED ORDER — VANCOMYCIN HCL 125 MG PO CAPS: ORAL_CAPSULE | ORAL | 0 refills | Status: AC

## 2024-03-24 MED ORDER — SEVELAMER CARBONATE 800 MG PO TABS
2400.0000 mg | ORAL_TABLET | Freq: Three times a day (TID) | ORAL | 0 refills | Status: DC
  Filled 2024-03-24: qty 270, 30d supply, fill #0

## 2024-03-24 MED ORDER — ISOSORB DINITRATE-HYDRALAZINE 20-37.5 MG PO TABS: 1.0000 | ORAL_TABLET | Freq: Three times a day (TID) | ORAL | 0 refills | Status: AC

## 2024-03-24 MED ORDER — ISOSORB DINITRATE-HYDRALAZINE 20-37.5 MG PO TABS
1.0000 | ORAL_TABLET | Freq: Three times a day (TID) | ORAL | 0 refills | Status: DC
  Filled 2024-03-24: qty 90, 30d supply, fill #0

## 2024-03-24 MED ORDER — OXYCODONE HCL 5 MG PO TABS: 5.0000 mg | ORAL_TABLET | Freq: Four times a day (QID) | ORAL | 0 refills | Status: DC | PRN

## 2024-03-24 NOTE — TOC Progression Note (Signed)
 Transition of Care [facility]) - Progression Note    Patient Details  Name: [name] MRN: [id number] Date of Birth: 02-24-1986  [facility] CM/SW Contact  Tom-Johnson, Harvest Muskrat, RN Phone Number: 03/24/2024, 10:17 AM  Clinical Narrative:     Resumption of outpatient order and referral on AVS.  CM will continue to follow as patient progresses towards discharge.        Expected Discharge Plan: Home/Self Care Barriers to Discharge: Continued Medical Work up               Expected Discharge Plan and Services In-house Referral: Clinical Social Work     Living arrangements for the past 2 months: Single Family Home                                       Social Drivers of Health (SDOH) Interventions SDOH Screenings   Food Insecurity: Food Insecurity Present (03/19/2024)  Housing: High Risk (03/19/2024)  Transportation Needs: Patient Declined (03/19/2024)  Utilities: Not At Risk (03/19/2024)  Depression (PHQ2-9): Medium Risk (06/02/2023)  Financial Resource Strain: High Risk (03/15/2024)  Physical Activity: Inactive (06/02/2023)  Social Connections: Moderately Isolated (03/19/2024)  Stress: Stress Concern Present (03/15/2024)  Tobacco Use: High Risk (03/18/2024)    Readmission Risk Interventions    02/10/2024    3:12 PM 12/28/2023    1:41 PM  Readmission Risk Prevention Plan  Transportation Screening Complete Complete  PCP or Specialist Appt within 3-5 Days  Complete  HRI or Home Care Consult  Complete  Social Work Consult for Recovery Care Planning/Counseling  Complete  Palliative Care Screening  Not Applicable  Medication Review Oceanographer)  Referral to Pharmacy  HRI or Home Care Consult Complete   SW Recovery Care/Counseling Consult Complete   Palliative Care Screening Not Applicable   Skilled Nursing Facility Not Applicable

## 2024-03-24 NOTE — Progress Notes (Signed)
 Daily Progress Note Intern Pager: [phone]  Patient name: [name] Medical record number: [id number] Date of birth: 07/03/1985 Age: 38 y.o. Gender: male  Primary Care Provider: Lonnie Earnest, MD Consultants: Nephrology Code Status: Full Code, confirmed with patient  Pt Overview and Major Events to Date:  11/7: Admitted to FMTS 11/9: Dialysis cath removed 11/12: Permanent cath placed  Assessment and Plan: [name] is a 38 year old male with a past medical history of Alport syndrome bilateral hearing loss, HTN, bipolar disorder, HF, ESRD on HD MWF.  Admitted for chills positive BCx from outpatient HD showing Klebsiella oxytoca, with suspected source of infection from HD catheter. TEE showed no vegetation gross with permanent cath placement done yesterday.  Plan for discharge today after HD.  Assessment & Plan Klebsiella Bacteremia TEE yesterday showed no vegetation growth. Per Pharmacy, continue Cefepime  2 weeks from line removal on 11/9, end date 11/23. - ID consulted, appreciate recommendations - Abx: IV Cefepime  1g daily (11/7- ), end date 11/23 - Pain: Tylenol  1000 mg q6h PRN - Labs: AM RFP, CBC End-stage renal disease on hemodialysis (HCC) Anemia associated with chronic renal failure HD MWF, s/p permanent catheter replacement yesterday - Nephrology following, appreciate recommendations - Oxycodone  5-10mg  q6 prn breakthrough pain from catheter site - Renvela  1600 mg TID with meals - Cinacalcet  30 mg MWF - Micera 150 mcg q2 weeks (last 11/3) - Monitor hemodynamic status closely  - Transfusion threshold 7, transfuse prn Resistant hypertension BP 208/108, per patient his baseline is 190s-200s/100-110s - Continuing home meds: Carvedilol  25 mg BID, losartan  100 mg daily, nifedipine  90 mg BID, spironolactone  100 mg daily, minoxidil  2.5 mg BID - Nephrology recommended hydralazine , to reduce pill burden BiDil 20-37.5 mg started, home med isosorbide  dinitrate  discontinued - Monitor closely Diarrhea C. difficile diarrhea Per ID pharm, starting OVP (oral vancomycin  prophylaxis): Cont Vanc 4 times daily then starting from 11/20, the dosing will be daily and continue thru 11/30 (7d beyond the completion of cefepime ) - C diff positive - Enteric precautions in place - Vancomycin  125mg  four times daily per pharm (11/9-11/19) - Vancomycin  125 mg once daily starting 11/20 for 10 days Mass of skin of right shoulder No concern for infection at this time given exam and patient's report. - Will have patient follow-up outpatient for further management Chronic health problem Alport syndrome: Bilateral sensorineural hearing loss Anemia of Chronic Disease: S/p ESA outpatient 11/3. Continue to optimize with nephrology. Hgb of 7.5 this am, stable from 7.2 yesterday. Bipolar 1 disorder: Continue Depakote  500 mg daily Migraines: Continue Depakote  500 mg daily GERD: Continue Protonix  40 mg daily  HFpEF: EF 60 to 65%, no RVMA, mildly dilated pulmonary artery with normal PAP    FEN/GI: Regular Diet PPx: Heparin  Dispo:Home today.   Subjective:  Patient not in room, left for dialysis. Will post-HD round.   Objective: Temp:  [97.6 F (36.4 C)-98.3 F (36.8 C)] 98.3 F (36.8 C) (11/13 0532) Pulse Rate:  [78-96] 88 (11/13 0532) Resp:  [14-20] 19 (11/13 0532) BP: (155-219)/(72-115) 208/108 (11/13 0532) SpO2:  [93 %-100 %] 98 % (11/13 0532)  Physical Exam: General: Well -appearing, no acute distress Cardio: Regular rate, regular rhythm, no murmurs on exam. LUE bruit at site of catheter Pulm: Clear, no wheezing, no crackles. No increased work of breathing Abdominal: bowel sounds present, soft, non-tender, non-distended Extremities: no peripheral edema     Laboratory: Most recent CBC Lab Results  Component Value Date   WBC 6.0 03/24/2024  HGB 8.2 (L) 03/24/2024   HCT 25.6 (L) 03/24/2024   MCV 83.9 03/24/2024   PLT 221 03/24/2024   Most recent  BMP    Latest Ref Rng & Units 03/24/2024    3:44 AM  BMP  Glucose 70 - 99 mg/dL 898   BUN 6 - 20 mg/dL 52   Creatinine 9.38 - 1.24 mg/dL 87.70   Sodium 864 - 854 mmol/L 133   Potassium 3.5 - 5.1 mmol/L 5.0   Chloride 98 - 111 mmol/L 94   CO2 22 - 32 mmol/L 24   Calcium  8.9 - 10.3 mg/dL 8.7     Imaging/Diagnostic Tests: TEE: Normal ejection fraction, mild tricuspid valve prolapse.  Mild to moderate regurgitation.  No vegetations found.  [name], MD 03/24/2024, 7:45 AM  PGY-1, [facility] FPTS Intern pager: [phone], text pages welcome Secure chat group [facility] Teaching Service

## 2024-03-24 NOTE — Progress Notes (Signed)
 OT Cancellation Note  Patient Details Name: [name] MRN: [id number] DOB: Aug 07, 1985   Cancelled Treatment:    Reason Eval/Treat Not Completed: Patient at procedure or test/ unavailable (Pt currently leaving. Will follow up as able.) Warrick POUR OTR/L  Acute Rehab Services  [phone] office number   Warrick Berber 03/24/2024, 7:35 AM

## 2024-03-24 NOTE — Progress Notes (Signed)
 DISCHARGE NOTE HOME [name] to be discharged Home per MD order. Discussed prescriptions and follow up appointments with the patient. Prescriptions given to patient; medication list explained in detail. Patient verbalized understanding.  Skin clean, dry and intact without evidence of skin break down, no evidence of skin tears noted. IV catheter discontinued intact. Site without signs and symptoms of complications. Dressing and pressure applied. Pt denies pain at the site currently. No complaints noted.  Patient aware to not take shower with HD catheter.   An After Visit Summary (AVS) was printed and given to the patient. Patient escorted via wheelchair, and discharged home via private auto.  Ayce Pietrzyk A Proctor-Gann, RN

## 2024-03-24 NOTE — Discharge Planning (Signed)
 [facility] Patient Discharge Orders - [facility] CLINIC: [place]  Patient's name: [name] Admit/DC Dates: 03/18/2024 - 03/24/24  DISCHARGE DIAGNOSES: Klebsiella bacteremia C. Diff HTN    HD ORDER CHANGES: Heparin  change: no EDW Change: no Bath Change: no  ANEMIA MANAGEMENT: Aranesp : Given: no  ESA dose for discharge: Same as prior - Mircera 150mcg q 2 weeks - due 11/17 IV Iron  dose at discharge: Hold until off abx, then per protocol Transfusion: Given: yes - 1U PRBCs 11/9  BONE/MINERAL MEDICATIONS: Hectorol/Calcitriol  change: no Sensipar /Parsabiv change: no  ACCESS INTERVENTION/CHANGE: yes Details: old TDC removal for line holiday. [facility] 11/12 with IR. Ongoing issues L AVG - needs VVS f/u after done with abx  RECENT LABS: Recent Labs  Lab 03/24/24 0344  HGB 8.2*  NA 133*  K 5.0  CALCIUM  8.7*  PHOS 6.5*  ALBUMIN  2.8*    IV ANTIBIOTICS: YES Details: CEFEPIME  2G IV Q HD UNTIL 04/03/24 (4D/WEEK FINE)  OTHER ANTICOAGULATION: no Details:  OTHER/APPTS/LAB ORDERS: - Continue to lower EDW as tolerated due to HTN - Will also be on PO Vanc until done with IV abx for C.diff  D/C Meds to be reconciled by nurse after every discharge.  Completed By: Izetta Boehringer, PA-C [facility] Pager [phone]   Reviewed by: MD:______ RN_______

## 2024-03-24 NOTE — Assessment & Plan Note (Signed)
 Alport syndrome: Bilateral sensorineural hearing loss Anemia of Chronic Disease: S/p ESA outpatient 11/3. Continue to optimize with nephrology. Hgb of 7.5 this am, stable from 7.2 yesterday. Bipolar 1 disorder: Continue Depakote  500 mg daily Migraines: Continue Depakote  500 mg daily GERD: Continue Protonix  40 mg daily  HFpEF: EF 60 to 65%, no RVMA, mildly dilated pulmonary artery with normal PAP

## 2024-03-24 NOTE — Assessment & Plan Note (Signed)
 HD MWF, s/p permanent catheter replacement yesterday - Nephrology following, appreciate recommendations - Oxycodone  5-10mg  q6 prn breakthrough pain from catheter site - Renvela  1600 mg TID with meals - Cinacalcet  30 mg MWF - Micera 150 mcg q2 weeks (last 11/3) - Monitor hemodynamic status closely  - Transfusion threshold 7, transfuse prn

## 2024-03-24 NOTE — TOC Transition Note (Signed)
 Transition of Care [facility]) - Discharge Note   Patient Details  Name: [name] MRN: [id number] Date of Birth: 1986/03/17  [facility] CM/SW Contact:  Tom-Johnson, Harvest Muskrat, RN Phone Number: 03/24/2024, 1:04 PM   Clinical Narrative:     Patient is scheduled for discharge today.  Readmission Risk Assessment done. Outpatient PT referral, hospital f/u and discharge instructions on AVS. Family to transport at discharge.  No further ICM needs noted.     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 CMS Medicare.gov Compare Post Acute Care list provided to:: Patient Choice offered to / list presented to : NA      Discharge Placement                Patient to be transferred to facility by: Family      Discharge Plan and Services Additional resources added to the After Visit Summary for   In-house Referral: Clinical Social Work              DME Arranged: N/A DME Agency: NA       HH Arranged: NA HH Agency: NA        Social Drivers of Health (SDOH) Interventions SDOH Screenings   Food Insecurity: Food Insecurity Present (03/19/2024)  Housing: High Risk (03/19/2024)  Transportation Needs: Patient Declined (03/19/2024)  Utilities: Not At Risk (03/19/2024)  Depression (PHQ2-9): Medium Risk (06/02/2023)  Financial Resource Strain: High Risk (03/15/2024)  Physical Activity: Inactive (06/02/2023)  Social Connections: Moderately Isolated (03/19/2024)  Stress: Stress Concern Present (03/15/2024)  Tobacco Use: High Risk (03/18/2024)     Readmission Risk Interventions    03/24/2024   12:06 PM 02/10/2024    3:12 PM 12/28/2023    1:41 PM  Readmission Risk Prevention Plan  Transportation Screening Complete Complete Complete  PCP or Specialist Appt within 3-5 Days    Complete  HRI or Home Care Consult   Complete  Social Work Consult for Recovery Care Planning/Counseling   Complete  Palliative Care Screening   Not Applicable  Medication Review Oceanographer) Referral to Pharmacy  Referral to Pharmacy  PCP or Specialist appointment within 3-5 days of discharge Complete    HRI or Home Care Consult Complete Complete   SW Recovery Care/Counseling Consult Complete Complete   Palliative Care Screening Not Applicable Not Applicable   Skilled Nursing Facility Not Applicable Not Applicable

## 2024-03-24 NOTE — Plan of Care (Signed)
  Problem: Education: Goal: Knowledge of General Education information will improve Description: Including pain rating scale, medication(s)/side effects and non-pharmacologic comfort measures Outcome: Adequate for Discharge   Problem: Health Behavior/Discharge Planning: Goal: Ability to manage health-related needs will improve Outcome: Adequate for Discharge   Problem: Clinical Measurements: Goal: Ability to maintain clinical measurements within normal limits will improve Outcome: Adequate for Discharge Goal: Will remain free from infection Outcome: Adequate for Discharge Goal: Diagnostic test results will improve Outcome: Adequate for Discharge Goal: Respiratory complications will improve Outcome: Adequate for Discharge Goal: Cardiovascular complication will be avoided Outcome: Adequate for Discharge   Problem: Nutrition: Goal: Adequate nutrition will be maintained Outcome: Adequate for Discharge   Problem: Coping: Goal: Level of anxiety will decrease Outcome: Adequate for Discharge   Problem: Elimination: Goal: Will not experience complications related to bowel motility Outcome: Adequate for Discharge Goal: Will not experience complications related to urinary retention Outcome: Adequate for Discharge   Problem: Pain Managment: Goal: General experience of comfort will improve and/or be controlled Outcome: Adequate for Discharge   Problem: Safety: Goal: Ability to remain free from injury will improve Outcome: Adequate for Discharge   Problem: Skin Integrity: Goal: Risk for impaired skin integrity will decrease Outcome: Adequate for Discharge   Problem: Education: Goal: Knowledge of disease and its progression will improve Outcome: Adequate for Discharge Goal: Individualized Educational Video(s) Outcome: Adequate for Discharge   Problem: Fluid Volume: Goal: Compliance with measures to maintain balanced fluid volume will improve Outcome: Adequate for Discharge    Problem: Health Behavior/Discharge Planning: Goal: Ability to manage health-related needs will improve Outcome: Adequate for Discharge

## 2024-03-24 NOTE — Evaluation (Signed)
 Physical Therapy Evaluation & Discharge Patient Details Name: [name] MRN: [id number] DOB: February 13, 1986 Today's Date: 03/24/2024  History of Present Illness  38 yo male presenting 03/18/24 due to  a positive blood culture from HD and had chills, diarrhea and sweats. C Diff +. HD removed 11/9, temp line placed 11/11, new tunneled HD cath placed and TEE 11/12. PMH HTN, HFpEF, Alport syndrome, ESRD on HD ( MWFS), bipolar 1, hearing loss, depression, tobacco use, Recent admission 8/16 - 8/18 COVID (+) with hypertension urgency and 9/10 with weakness, chills, body aches and general malaise facial numbness x2 weeks.   Clinical Impression  Pt presents with condition above. PTA, he was mod I intermittently using a SPC for functional mobility when his legs felt weak. He lives alone in a 1-level apartment with a level entry. Currently, pt appears to be functioning at his baseline, mobilizing IND-mod I without any LOB. He was able to reach to the ground and perform a deep quat to simulate reaching for various objects without any LOB, UE support, or assistance. He did express his legs feeling weak as the distance he ambulated increased, demonstrating deficits in muscular endurance/activity tolerance. He reported he is currently working with OPPT to address this concern. Recommend pt resume OPPT. Educated pt to walk the halls >/= 3x/day to maintain strength while here. He verbalized understanding. No further acute PT services needed since pt is at his baseline and all other PT needs can be met in the next venue of care. All education completed and questions answered. PT will sign off. Pt in agreement. Thank you for this referral.         If plan is discharge home, recommend the following:  (N/A)   Can travel by private vehicle        Equipment Recommendations None recommended by PT  Recommendations for Other Services       Functional Status Assessment Patient has not had a recent decline in their  functional status     Precautions / Restrictions Precautions Precautions: None Restrictions Weight Bearing Restrictions Per Provider Order: No      Mobility  Bed Mobility Overal bed mobility: Modified Independent             General bed mobility comments: HOB elevated, no assistance needed    Transfers Overall transfer level: Independent Equipment used: None               General transfer comment: No LOB, no assistance needed    Ambulation/Gait Ambulation/Gait assistance: Modified independent (Device/Increase time) Gait Distance (Feet): 400 Feet Assistive device: None (wall rail briefly) Gait Pattern/deviations: WFL(Within Functional Limits) Gait velocity: WFL     General Gait Details: Pt ambulated the majority of the distance without UE support. He did grab the wall rail towards the end for ~20 ft due to his legs feeling weak, but no buckling noted. No overt LOB, no assistance needed  Stairs            Wheelchair Mobility     Tilt Bed    Modified Rankin (Stroke Patients Only)       Balance Overall balance assessment: Independent                                           Pertinent Vitals/Pain Pain Assessment Pain Assessment: Faces Faces Pain Scale: Hurts little more Pain Location: R chest  at site of HD cath Pain Descriptors / Indicators: Discomfort, Grimacing, Operative site guarding, Sore Pain Intervention(s): Monitored during session, Limited activity within patient's tolerance, Repositioned    Home Living Family/patient expects to be discharged to:: Private residence Living Arrangements: Alone   Type of Home: Apartment Home Access: Level entry       Home Layout: One level Home Equipment: Cane - single point      Prior Function Prior Level of Function : Independent/Modified Independent;Driving             Mobility Comments: Denied falls, reports intermittent use of SPC when feels weak ADLs Comments: No  longer working while on dialysis.     Extremity/Trunk Assessment   Upper Extremity Assessment Upper Extremity Assessment: Overall WFL for tasks assessed;RUE deficits/detail RUE Deficits / Details: R shoulder ROM limited by pain from HD cath site, but grossly [facility]    Lower Extremity Assessment Lower Extremity Assessment: Overall WFL for tasks assessed (reports weakness as he fatigued, but functionally WFL; edema noted; sensation intact bil)    Cervical / Trunk Assessment Cervical / Trunk Assessment: Normal  Communication   Communication Communication: Impaired Factors Affecting Communication: Hearing impaired (has hearing aide)    Cognition Arousal: Alert Behavior During Therapy: WFL for tasks assessed/performed   PT - Cognitive impairments: No apparent impairments                         Following commands: Intact       Cueing Cueing Techniques: Verbal cues     General Comments General comments (skin integrity, edema, etc.): Educated pt to walk the halls >/= 3x/day to maintain strength while here. He verbalized understanding.    Exercises     Assessment/Plan    PT Assessment All further PT needs can be met in the next venue of care  PT Problem List Decreased activity tolerance;Decreased strength;Decreased mobility       PT Treatment Interventions      PT Goals (Current goals can be found in the Care Plan section)  Acute Rehab PT Goals Patient Stated Goal: to improve PT Goal Formulation: All assessment and education complete, DC therapy Time For Goal Achievement: 03/25/24 Potential to Achieve Goals: Good    Frequency       Co-evaluation               AM-PAC PT 6 Clicks Mobility  Outcome Measure Help needed turning from your back to your side while in a flat bed without using bedrails?: None Help needed moving from lying on your back to sitting on the side of a flat bed without using bedrails?: None Help needed moving to and from a bed  to a chair (including a wheelchair)?: None Help needed standing up from a chair using your arms (e.g., wheelchair or bedside chair)?: None Help needed to walk in hospital room?: None Help needed climbing 3-5 steps with a railing? : None 6 Click Score: 24    End of Session   Activity Tolerance: Patient tolerated treatment well Patient left: in bed;with call bell/phone within reach;with nursing/sitter in room (no bed alarm on upon arrival) Nurse Communication: Mobility status PT Visit Diagnosis: Muscle weakness (generalized) (M62.81);Difficulty in walking, not elsewhere classified (R26.2)    Time: 9292-9280 PT Time Calculation (min) (ACUTE ONLY): 12 min   Charges:   PT Evaluation $PT Eval Low Complexity: 1 Low   PT General Charges $$ ACUTE PT VISIT: 1 Visit  Theo Ferretti, PT, DPT Acute Rehabilitation Services  Office: [phone]   Theo CHRISTELLA Ferretti 03/24/2024, 8:05 AM

## 2024-03-24 NOTE — Progress Notes (Signed)
 [facility]  Date of Admission:  03/18/2024     Reason for Follow Up: Bacteremia  Total days of antibiotics 7         ASSESSMENT:  [name] is a 38 year old African-American male with Alport syndrome and end-stage renal disease on hemodialysis with recent Klebsiella bacteremia status posttreatment admitted with recurrent Klebsiella bacteremia and found to have C. difficile colitis.  [name] completed line holiday with new line placed on 03/23/2024.  Blood cultures from 03/20/2024 without growth.  Discussed plan of care for 2 weeks of cefepime  with hemodialysis for Klebsiella bacteremia most likely related to previous dialysis catheter.  End date of treatment will be 04/03/2024 and will arrange for antibiotics with hemodialysis.  Continue current dose of oral vancomycin  for C. difficile.  Arrange follow-up in ID office.  Okay for discharge from ID standpoint.  Continue enteric precautions.  Remaining medical and supportive care per internal medicine.  PLAN:  Continue current dose of cefepime  through 04/03/2024 with hemodialysis. Continue oral vancomycin  for C. difficile colitis through 03/30/2024. Follow-up in ID office in 4 weeks for surveillance cultures. Enteric precautions for C. difficile colitis. Okay for discharge from ID standpoint. Remaining medical and supportive care per internal medicine.  Principal Problem:   Klebsiella Bacteremia Active Problems:   Anemia associated with chronic renal failure   End-stage renal disease on hemodialysis (HCC)   Diarrhea   Chronic health problem   Resistant hypertension   Mass of skin of right shoulder   C. difficile diarrhea    calcitRIOL   1 mcg Oral Q M,W,F-HD   carvedilol   25 mg Oral BID   Chlorhexidine  Gluconate Cloth  6 each Topical Q0600   cinacalcet   30 mg Oral Q M,W,F-1800   divalproex   500 mg Oral Daily   famotidine   10 mg Oral Daily   heparin   5,000 Units Subcutaneous Q8H   heparin  sodium  (porcine)  10 mL Intracatheter Once   isosorbide -hydrALAZINE   1 tablet Oral TID   losartan   100 mg Oral Daily   minoxidil   2.5 mg Oral BID   NIFEdipine   90 mg Oral BID   pantoprazole   40 mg Oral Daily   sevelamer  carbonate  2,400 mg Oral TID WC   vancomycin   125 mg Oral QID    SUBJECTIVE:  Afebrile overnight with no acute events.  Tolerating antibiotics with no adverse side effects.  Feeling better since arriving to the hospital.  Asking when [name] can go home.  Allergies  Allergen Reactions   Nsaids Other (See Comments)    Contraindication due to ckd    Zestril [Lisinopril] Swelling   Risperdal  [Risperidone ] Other (See Comments)    Unknown reaction      Review of Systems: Review of Systems  Constitutional:  Negative for chills, fever and weight loss.  Respiratory:  Negative for cough, shortness of breath and wheezing.   Cardiovascular:  Negative for chest pain and leg swelling.  Gastrointestinal:  Negative for abdominal pain, constipation, diarrhea, nausea and vomiting.  Skin:  Negative for rash.      OBJECTIVE: Vitals:   03/24/24 1200 03/24/24 1217 03/24/24 1219 03/24/24 1309  BP: (!) 201/94 (!) 208/96 (!) 191/98 (!) 202/99  Pulse: 88 90 90 89  Resp: 10 15 14 16   Temp:   98.5 F (36.9 C) 98.4 F (36.9 C)  TempSrc:    Oral  SpO2: 98% 97% 96% 99%  Weight:      Height:  Body mass index is 28.29 kg/m.  Physical Exam Constitutional:      General: [name] is not in acute distress.    Appearance: [name] is well-developed.  Cardiovascular:     Rate and Rhythm: Normal rate and regular rhythm.     Heart sounds: Normal heart sounds.     Comments: Dialysis catheter placed in right upper chest.  Currently infusing. Pulmonary:     Effort: Pulmonary effort is normal.     Breath sounds: Normal breath sounds.  Skin:    General: Skin is warm and dry.  Neurological:     Mental Status: [name] is alert.     Lab Results Lab Results  Component Value Date   WBC 6.0 03/24/2024    HGB 8.2 (L) 03/24/2024   HCT 25.6 (L) 03/24/2024   MCV 83.9 03/24/2024   PLT 221 03/24/2024    Lab Results  Component Value Date   CREATININE 12.29 (H) 03/24/2024   BUN 52 (H) 03/24/2024   NA 133 (L) 03/24/2024   K 5.0 03/24/2024   CL 94 (L) 03/24/2024   CO2 24 03/24/2024    Lab Results  Component Value Date   ALT 20 03/18/2024   AST 21 03/18/2024   ALKPHOS 164 (H) 03/18/2024   BILITOT 0.7 03/18/2024     Microbiology: Recent Results (from the past 240 hours)  Culture, blood (routine x 2)     Status: None   Collection Time: 03/18/24  6:13 AM   Specimen: BLOOD RIGHT HAND  Result Value Ref Range Status   Specimen Description BLOOD RIGHT HAND  Final   Special Requests   Final    BOTTLES DRAWN AEROBIC AND ANAEROBIC Blood Culture adequate volume   Culture   Final    NO GROWTH 5 DAYS Performed at [facility], [address] [place], [place] [address]    Report Status 03/23/2024 FINAL  Final  Culture, blood (routine x 2)     Status: None   Collection Time: 03/18/24  9:50 PM   Specimen: BLOOD RIGHT HAND  Result Value Ref Range Status   Specimen Description BLOOD RIGHT HAND  Final   Special Requests   Final    BOTTLES DRAWN AEROBIC AND ANAEROBIC Blood Culture results may not be optimal due to an inadequate volume of blood received in culture bottles   Culture   Final    NO GROWTH 5 DAYS Performed at [facility], [address] [place], [place] [address]    Report Status 03/23/2024 FINAL  Final  C Difficile Quick Screen w PCR reflex     Status: Abnormal   Collection Time: 03/20/24  6:46 AM   Specimen: STOOL  Result Value Ref Range Status   C Diff antigen POSITIVE (A) NEGATIVE Final   C Diff toxin POSITIVE (A) NEGATIVE Final   C Diff interpretation Toxin producing C. difficile detected.  Final    Comment: RESULT CALLED TO, READ BACK BY AND VERIFIED WITH: RN Rosina SQUIBB on [id number] @1252  by sm Performed at [facility], [address], [place],  [place] [address]   Culture, blood (Routine X 2) w Reflex to ID Panel     Status: None (Preliminary result)   Collection Time: 03/20/24  1:52 PM   Specimen: BLOOD RIGHT HAND  Result Value Ref Range Status   Specimen Description BLOOD RIGHT HAND  Final   Special Requests   Final    BOTTLES DRAWN AEROBIC AND ANAEROBIC Blood Culture  results may not be optimal due to an inadequate volume of blood received in culture bottles   Culture   Final    NO GROWTH 4 DAYS Performed at [facility], [address] [place], [place] [address]    Report Status PENDING  Incomplete  Culture, blood (Routine X 2) w Reflex to ID Panel     Status: None (Preliminary result)   Collection Time: 03/20/24  1:52 PM   Specimen: BLOOD RIGHT HAND  Result Value Ref Range Status   Specimen Description BLOOD RIGHT HAND  Final   Special Requests   Final    BOTTLES DRAWN AEROBIC AND ANAEROBIC Blood Culture results may not be optimal due to an inadequate volume of blood received in culture bottles   Culture   Final    NO GROWTH 4 DAYS Performed at [facility], [address] [place], [place] [address]    Report Status PENDING  Incomplete     Cathlyn July, NP [facility]  03/24/2024  2:42 PM

## 2024-03-24 NOTE — Assessment & Plan Note (Signed)
 No concern for infection at this time given exam and patient's report. - Will have patient follow-up outpatient for further management

## 2024-03-24 NOTE — Procedures (Signed)
 HD Note:  Some information was entered later than the data was gathered due to patient care needs. The stated time with the data is accurate.  Received patient in bed to unit.   Alert and oriented.   Informed consent signed and in chart.   Access used: upper right chest HD tunneled catheter Access issues: None Patient very concerned about the new tunneled site and the removal of the temp cath sites bleeding.  There was no active bleeding noted.  This was explained to the patient.  Patient tolerated treatment well.   TX duration: 4 hours  Alert, without acute distress.  Total UF removed: 4000 ml  Hand-off given to patient's nurse.   Transported back to the room   Ritvik Mczeal L. Lenon, RN Kidney Dialysis Unit.

## 2024-03-24 NOTE — Discharge Instructions (Addendum)
 Dear [name],  Thank you for letting us  participate in your care. You were admitted to the hospital because your blood cultures grew bacteria, and you were found to have an infection in your bloodstream.Your blood cultures showed a bacterial infection called Klebsiella oxytoca. You were treated with an antibiotic called cefepime , given with your dialysis sessions. You will continue this treatment for a total of 2 weeks from the date your line was removed. The last day of your antibiotic treatment will be November 23.  Your diarrhea was caused by C. difficile bacteria. You were started on oral vancomycin  125 mg four times daily for 10 days, ending on November 20. After that, you will continue taking vancomycin  once daily through November 30 to prevent the infection from coming back.  Your dialysis catheter was removed for a short break to help clear the infection. A new catheter was placed on November 12, and your potassium levels returned to normal.  Your blood pressure remained elevated during your hospital stay. It was managed with your regular medications, and the Nephrology team recommended adding an additional medication to better control it. We replaced your hydralazine  with a new medication called BiDil, which combines two medications to help lower your blood pressure and support heart function.  Other problems managed this admission included   MEDICATION CHANGES Started: Cefepime  - given during dialysis through November 23 for bloodstream infection. Vancomycin  - four times daily until November 20, then once daily through November 30 for C. difficile infection. BiDil - new blood pressure medication (replaces hydralazine ).  Stopped: Hydralazine  - discontinued and replaced with BiDil.  POST-HOSPITAL & CARE INSTRUCTIONS Please continue to take your antibiotics as prescribed Please call to arrange follow up with your primary care physician in 1 week.  Go to your follow up  appointments (listed below)   DOCTOR'S APPOINTMENT   Future Appointments  Date Time Provider Department Center  04/14/2024  3:00 PM [facility] 6 [facility]  04/14/2024  4:00 PM Lanis Fonda BRAVO, MD [facility] H[facility]V    Follow-up Information     [facility] Follow up.   Specialty: Rehabilitation Contact information: [address] [place]  05/26/1985 [phone]                Take care and be well!  Family Medicine Teaching Service Inpatient Team [place]  [facility]  [address] [place], [place] [address] [phone]

## 2024-03-24 NOTE — TOC Transition Note (Signed)
 Transition of Care [facility]) - Discharge Note   Patient Details  Name: [name] MRN: [id number] Date of Birth: [birth year]  Transition [facility]) CM/SW Contact:  Tom-Johnson, Harvest Muskrat, RN Phone Number: 03/24/2024, 12:10 PM   Clinical Narrative:     Patient is scheduled for discharge today.  Readmission Risk Assessment done. Outpatient PT referral, hospital f/u and discharge instructions on AVS. Family to transport at discharge.  No further ICM needs noted.     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 CMS Medicare.gov Compare Post Acute Care list provided to:: Patient Choice offered to / list presented to : NA      Discharge Placement                Patient to be transferred to facility by: Family      Discharge Plan and Services Additional resources added to the After Visit Summary for   In-house Referral: Clinical Social Work              DME Arranged: N/A DME Agency: NA       HH Arranged: NA HH Agency: NA        Social Drivers of Health (SDOH) Interventions SDOH Screenings   Food Insecurity: Food Insecurity Present (03/19/2024)  Housing: High Risk (03/19/2024)  Transportation Needs: Patient Declined (03/19/2024)  Utilities: Not At Risk (03/19/2024)  Depression (PHQ2-9): Medium Risk (06/02/2023)  Financial Resource Strain: High Risk (03/15/2024)  Physical Activity: Inactive (06/02/2023)  Social Connections: Moderately Isolated (03/19/2024)  Stress: Stress Concern Present (03/15/2024)  Tobacco Use: High Risk (03/18/2024)     Readmission Risk Interventions    03/24/2024   12:06 PM 02/10/2024    3:12 PM 12/28/2023    1:41 PM  Readmission Risk Prevention Plan  Transportation Screening Complete Complete Complete  PCP or Specialist Appt within 3-5 Days    Complete  HRI or Home Care Consult   Complete  Social Work Consult for Recovery Care Planning/Counseling   Complete  Palliative Care Screening   Not Applicable  Medication Review Oceanographer) Referral to Pharmacy  Referral to Pharmacy  PCP or Specialist appointment within 3-5 days of discharge Complete    HRI or Home Care Consult Complete Complete   SW Recovery Care/Counseling Consult Complete Complete   Palliative Care Screening Not Applicable Not Applicable   Skilled Nursing Facility Not Applicable Not Applicable

## 2024-03-24 NOTE — Progress Notes (Addendum)
 [facility] Progress Note   Subjective:   Seen in K[facility] - on HD with 4L UFG. S/p new tunneled HD line yesterday afternoon - no issues. Denies CP/dyspnea.  Objective Vitals:   03/24/24 0830 03/24/24 0900 03/24/24 0930 03/24/24 1000  BP: (!) 203/93 (!) 185/94 (!) 227/98 (!) 193/97  Pulse: 86 83 88 87  Resp: 16 13 14 15   Temp:      TempSrc:      SpO2: 98% 95% 97% 96%  Weight:      Height:       Physical Exam General: Well appearing, NAD. Deaf, reads lips Heart: RRR, no murmur Lungs: CTAB Abdomen: soft Extremities: 1+ LE edema Dialysis Access: Tunneled R internal jugular line. LUE AVG + bruit (cannot be successfully cannulated)  Additional Objective Labs: Basic Metabolic Panel: Recent Labs  Lab 03/22/24 0428 03/22/24 1129 03/23/24 0600 03/24/24 0344  NA 134*  --  134* 133*  K 6.2* 6.5* 4.0 5.0  CL 96*  --  92* 94*  CO2 18*  --  24 24  GLUCOSE 79  --  69* 101*  BUN 75*  --  41* 52*  CREATININE 15.43*  --  9.62* 12.29*  CALCIUM  8.8*  --  8.6* 8.7*  PHOS 7.3*  --  4.8* 6.5*   Liver Function Tests: Recent Labs  Lab 03/18/24 0210 03/19/24 0436 03/22/24 0428 03/23/24 0600 03/24/24 0344  AST 21  --   --   --   --   ALT 20  --   --   --   --   ALKPHOS 164*  --   --   --   --   BILITOT 0.7  --   --   --   --   PROT 7.9  --   --   --   --   ALBUMIN  3.1*   < > 3.0* 2.8* 2.8*   < > = values in this interval not displayed.   Recent Labs  Lab 03/18/24 0210  LIPASE 32   CBC: Recent Labs  Lab 03/20/24 1536 03/21/24 0110 03/21/24 0447 03/22/24 0428 03/23/24 0600 03/24/24 0344  WBC 5.5  --  6.0 6.0 4.8 6.0  HGB 6.9*   < > 7.9* 8.7* 8.3* 8.2*  HCT 22.3*   < > 24.5* 26.8* 25.6* 25.6*  MCV 83.2  --  81.9 83.0 83.1 83.9  PLT 278  --  262 258 253 221   < > = values in this interval not displayed.   Medications:  ceFEPime  (MAXIPIME ) IV Stopped (03/23/24 2002)    calcitRIOL   1 mcg Oral Q M,W,F-HD   carvedilol   25 mg Oral BID   Chlorhexidine   Gluconate Cloth  6 each Topical Q0600   cinacalcet   30 mg Oral Q M,W,F-1800   divalproex   500 mg Oral Daily   famotidine   10 mg Oral Daily   heparin   5,000 Units Subcutaneous Q8H   heparin  sodium (porcine)  10 mL Intracatheter Once   isosorbide -hydrALAZINE   1 tablet Oral TID   losartan   100 mg Oral Daily   minoxidil   2.5 mg Oral BID   NIFEdipine   90 mg Oral BID   pantoprazole   40 mg Oral Daily   sevelamer  carbonate  2,400 mg Oral TID WC   vancomycin   125 mg Oral QID    Dialysis Orders MWFS[facility] - NW 4:00 Nipro EDW 72.5kg 2K/2Ca AVG/TDC  - No bolus heparin  order - Mircera 150 mcg q 2 weeks (last 11/3) -  Calcitriol  1.0 q HD; Sensipar  30 q HD, sevelamer  800 2 q meals  - Last HD 11/5 -- post HD wt 77.4 kg    Assessment/Plan: Klebsiella bacteremia. Blood Cx 11/3 from OP HD unit grew Klebsiella oxytoca. Prior Klebsiella bacteremia in October and completed course of cefepime . Repeat BCx 11/7, 11/9 NGTD. On cefepime . No vegetation on TTE or TEE. S/p line holiday. ESRD. Now 4x/week HD as outpatient, off schedule here - HD today, using new tunneled line without issues, 4L UFG. Access. L arm AVG placed at [facility] 11/2023. Graft patent on f'gram in Sept. Have been unable to cannulate graft successfully to date. Appreciate VVS input - no need for another shuntogram. Will most likely need new perm access. F/u as outpatient.  Hypertension/volume. Chronic issue despite meds and HD, UF as tolerated (remains well above dry weight) Anemia. Hgb 8.2. Due for ESA on 11/17. S/p 1U PRBCs 11/9.   Metabolic bone disease.  Ca ok, Phos a little high - continue home binders + VDRA + sensipar . Nutrition. On regular diet with fluid restriction. Counseled on low K diet choices. May need to add Lokelma . C.diff. Finishing course of PO Vanc.    Addendum: Per pharm/ID - plan will be Cefepime  q HD until 04/03/24.   Izetta Boehringer, PA-C 03/24/2024, 10:12 AM  [employer]

## 2024-03-24 NOTE — Discharge Summary (Cosign Needed Addendum)
 Family Medicine Teaching [facility] Discharge Summary  Patient name: [name] Medical record number: [id number] Date of birth: 01-02-1986 Age: 38 y.o. Gender: male Date of Admission: 03/18/2024  Date of Discharge: 11/13 Admitting Physician: Kathrine Melena, DO  Primary Care Provider: Lonnie Earnest, MD Consultants: Nephrology, ID  Indication for Hospitalization: Fever, Chills  Discharge Diagnoses/Problem List:  Principal Problem for Admission: Klebsiella Bacteremia Other Problems addressed during stay: Anemia associated with chronic renal failure, ESRD on HD, C. Diff Diarrhea, Resistant HTN, Mass on R Shoulder   Brief Hospital Course:  [name] is a 38 y.o.male with a history of Alport Syndrome, HTN, Bipolar disorder, HFpEF, ESRD on HD who was admitted to the [facility] Medicine Teaching Service at [facility] for bacteremia. His hospital course is detailed below:  Bacteremia Patient presented with generalized chills, sweats, and repeat blood culture positive for Klebsiella oxytoca on 11/3.  Blood cultures were repeated while inpatient in the revealed Klebsiella growth.  Patient was started on cefepime  with HD per pharmacy for 2 weeks from line removal, with end date of 11/23.  Diarrhea Persistent diarrhea since last admission and has been on numerous antibiotics.  C. difficile screen was formed and revealed positive C Diff, with Antigen (+) and toxin (+) on 11/9. Patient placed on enteric precautions and started on Vancomycin  135 mg 4 times daily for 10 days until 11/20, where he will continue Vancomycin  once daily through 11/30 to complete OVP/Oral vanc prophylaxis.   End-Stage renal disease on HD Catheter removed for line holiday on 11/9, new catheter placement for 11/11.  Potassium levels were elevated.  IR delayed new catheter placement to 11/12.  In the meantime, patient had temporary catheter placed for his hemodialysis.  Potassium levels normalized and patient was able to  have his new catheter placed 11/12.  Other chronic conditions were medically managed with home medications and formulary alternatives as necessary (HTN, Alport Syndrome, Anemia of CKD, Bipolar 1, HFpEF, GERD)  PCP Follow-up Recommendations: Address right back shoulder nodule; potentially needs Gen Surg referral for removal + biopsy VVS OP follow up for new dialysis access Ensure patient is taking his antibiotics correctly  Follow up with HTN medication changes and adjust accordingly     Results/Tests Pending at Time of Discharge:  Unresulted Labs (From admission, onward)    None       Disposition: Home  Discharge Condition: Stable  Discharge Exam:  Vitals:   03/24/24 1030 03/24/24 1100  BP: (!) 195/92 (!) 198/61  Pulse: 90 85  Resp: (!) 21 13  Temp:    SpO2: 98% 96%   Physical Exam: General: Well -appearing, no acute distress Cardio: Regular rate, regular rhythm, no murmurs on exam. LUE bruit at site of catheter Pulm: Clear, no wheezing, no crackles. No increased work of breathing Abdominal: bowel sounds present, soft, non-tender, non-distended Extremities: no peripheral edema   Significant Procedures:  Echo TEE:  Normal ejection fraction, mild tricuspid valve prolapse.  Mild to moderate regurgitation.  No vegetations found.   Significant Labs and Imaging:  Recent Labs  Lab 03/23/24 0600 03/24/24 0344  WBC 4.8 6.0  HGB 8.3* 8.2*  HCT 25.6* 25.6*  PLT 253 221   Recent Labs  Lab 03/23/24 0600 03/24/24 0344  NA 134* 133*  K 4.0 5.0  CL 92* 94*  CO2 24 24  GLUCOSE 69* 101*  BUN 41* 52*  CREATININE 9.62* 12.29*  CALCIUM  8.6* 8.7*  PHOS 4.8* 6.5*  ALBUMIN  2.8* 2.8*  Discharge Medications:  Allergies as of 03/24/2024       Reactions   Nsaids Other (See Comments)   Contraindication due to ckd    Zestril [lisinopril] Swelling   Risperdal  [risperidone ] Other (See Comments)   Unknown reaction         Medication List     STOP taking these  medications    Cholecalciferol 1.25 MG (50000 UT) Tabs   Clotrimazole  Anti-Fungal 1 % cream Generic drug: clotrimazole    isosorbide  mononitrate 20 MG tablet Commonly known as: ISMO    spironolactone  100 MG tablet Commonly known as: ALDACTONE        TAKE these medications    Acetaminophen  Extra Strength 500 MG Tabs Take 1 tablet (500 mg total) by mouth every 6 (six) hours as needed for mild pain (pain score 1-3) or fever.   carvedilol  25 MG tablet Commonly known as: COREG  Take 1 tablet (25 mg total) by mouth 2 (two) times daily with a meal.   cinacalcet  30 MG tablet Commonly known as: SENSIPAR  Take 1 tablet (30 mg total) by mouth every Monday, Wednesday, and Friday at 6 PM.   divalproex  500 MG 24 hr tablet Commonly known as: DEPAKOTE  ER Take 1 tablet (500 mg total) by mouth daily.   isosorbide -hydrALAZINE  20-37.5 MG tablet Commonly known as: BIDIL Take 1 tablet by mouth 3 (three) times daily.   losartan  100 MG tablet Commonly known as: COZAAR  Take 100 mg by mouth daily.   minoxidil  2.5 MG tablet Commonly known as: LONITEN  Take 1 tablet (2.5 mg total) by mouth 2 (two) times daily.   NIFEdipine  90 MG 24 hr tablet Commonly known as: ADALAT  CC Take 90 mg by mouth in the morning and at bedtime.   oxyCODONE  5 MG immediate release tablet Commonly known as: Oxy IR/ROXICODONE  Take 1 tablet (5 mg total) by mouth every 6 (six) hours as needed for moderate pain (pain score 4-6).   pantoprazole  40 MG tablet Commonly known as: PROTONIX  Take 1 tablet (40 mg total) by mouth daily.   sevelamer  carbonate 800 MG tablet Commonly known as: RENVELA  Take 3 tablets (2,400 mg total) by mouth 3 (three) times daily with meals. What changed: how much to take   vancomycin  125 MG capsule Commonly known as: VANCOCIN  Take 1 capsule (125 mg total) by mouth 4 (four) times daily for 6 days, THEN 1 capsule (125 mg total) daily for 10 days. Start taking on: March 25, 2024         Discharge Instructions: Please refer to Patient Instructions section of EMR for full details.  Patient was counseled important signs and symptoms that should prompt return to medical care, changes in medications, dietary instructions, activity restrictions, and follow up appointments.   Follow-Up Appointments:  Follow-up Information     [facility] Follow up.   Specialty: Rehabilitation Contact information: [address] [place]  02/26/1986 [phone]               Future Appointments  Date Time Provider Department Center  03/29/2024  1:30 PM Lonnie Earnest, MD [facility]  04/14/2024  3:00 PM [facility] 6 [facility] H[facility]V  04/14/2024  4:00 PM Lanis Fonda BRAVO, MD [facility]   [name], MD 03/24/2024, 11:33 AM PGY-1, [employer] [facility]    I have verified that the service and findings are accurately documented in the resident's note above.   Damien Cassis, MD  03/24/2024, 2:04 PM\

## 2024-03-24 NOTE — Plan of Care (Signed)

## 2024-03-24 NOTE — Assessment & Plan Note (Signed)
 TEE yesterday showed no vegetation growth. Per Pharmacy, continue Cefepime  2 weeks from line removal on 11/9, end date 11/23. - ID consulted, appreciate recommendations - Abx: IV Cefepime  1g daily (11/7- ), end date 11/23 - Pain: Tylenol  1000 mg q6h PRN - Labs: AM RFP, CBC

## 2024-03-24 NOTE — Assessment & Plan Note (Signed)
 Per ID pharm, starting OVP (oral vancomycin  prophylaxis): Cont Vanc 4 times daily then starting from 11/20, the dosing will be daily and continue thru 11/30 (7d beyond the completion of cefepime ) - C diff positive - Enteric precautions in place - Vancomycin  125mg  four times daily per pharm (11/9-11/19) - Vancomycin  125 mg once daily starting 11/20 for 10 days

## 2024-03-24 NOTE — Progress Notes (Signed)
 OT Cancellation Note  Patient Details Name: BEAUREGARD JARRELLS MRN: [id number] DOB: May 19, 1985   Cancelled Treatment:    Reason Eval/Treat Not Completed: OT screened, no needs identified, will sign off (Spoke to Physical Therapy about level of function and does not require Acute Occupational Therapy. If this changes, please reconsult.)  Myeisha Kruser K OTR/L  Acute Rehab Services  [phone] office number  Warrick Berber 03/24/2024, 8:22 AM

## 2024-03-24 NOTE — Progress Notes (Signed)
 D/C order noted. Contacted [facility] to be advised of pt's d/c today and that pt should resume care tomorrow. Renal PA called HD clinic to confirm that clinic has abx needed with HD at d/c. Orders sent to HD clinic by renal PA as well.   Randine Mungo Dialysis Navigator [phone]

## 2024-03-24 NOTE — Assessment & Plan Note (Signed)
 BP 208/108, per patient his baseline is 190s-200s/100-110s - Continuing home meds: Carvedilol  25 mg BID, losartan  100 mg daily, nifedipine  90 mg BID, spironolactone  100 mg daily, minoxidil  2.5 mg BID - Nephrology recommended hydralazine , to reduce pill burden BiDil 20-37.5 mg started, home med isosorbide  dinitrate discontinued - Monitor closely

## 2024-03-24 NOTE — Progress Notes (Signed)
 PHARMACY CONSULT NOTE FOR:  OUTPATIENT  PARENTERAL ANTIBIOTIC THERAPY (OPAT)  Indication: Klebsiella oxytoca bacteremia  Regimen: Cefepime  2 g IV QHD- patient dialyzes 4x/wk as outpt   End date: 04/03/24 (2w from 03/20/24)   IV antibiotic discharge orders are pended. To discharging provider:  please sign these orders via discharge navigator,  Select New Orders & click on the button choice - Manage This Unsigned Work.     Thank you for allowing pharmacy to be a part of this patient's care.  Massie Fila, PharmD Clinical Pharmacist  03/24/2024 10:41 AM

## 2024-03-25 ENCOUNTER — Other Ambulatory Visit (HOSPITAL_COMMUNITY): Payer: Self-pay

## 2024-03-25 ENCOUNTER — Telehealth: Payer: Self-pay

## 2024-03-25 LAB — CULTURE, BLOOD (ROUTINE X 2)
Culture: NO GROWTH
Culture: NO GROWTH

## 2024-03-25 NOTE — Telephone Encounter (Signed)
 Prior authorization submitted for Sevelamer  Carbonate 800MG  tablets to [employer] via Latent.   Key: [id number]

## 2024-03-28 NOTE — Telephone Encounter (Signed)
 Pharmacy Patient Advocate Encounter  Received notification from [employer] that Prior Authorization for Sevelamer  Carbonate 800MG  tablets has been DENIED.  Full denial letter will be uploaded to the media tab. See denial reason below.  You asked for a drug that is used for end stage renal disease (ESRD) and dialysis. The Medicare rule in the Prescription Drug Benefit Manual (Chapter 6, Section 20.2) says that drugs covered under the Part B (medical) benefit cannot be covered under Part D. The Medicare rule in the Medicare Benefit Policy Manual (Chapter 11, Section 20.3) says all drugs used for the treatment of ESRD are included in a Part B bundled payment (which can include a Transitional Drug Add-on Payment Adjustment (TDAPA)) to the dialysis facility and are not separately paid. This rule applies even if you are not using the drug to treat your kidney disease. This drug cannot be billed separately under your Part D benefit.   PA #/Case ID/Reference #: [id number]   Contacted pharmacy. Pharmacy will reach out to patient to get his medicare card for billing.

## 2024-03-29 ENCOUNTER — Inpatient Hospital Stay: Payer: Self-pay | Admitting: Family Medicine

## 2024-03-29 NOTE — Progress Notes (Deleted)
    SUBJECTIVE:   CHIEF COMPLAINT / HPI:   Patient is a 38 year old male presents for hospital follow-up. 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  Brief Hospital course:  Patient was seen for HD and blood cultures were positive for Klebsiella.  Source thought to be due to indwelling catheter.  Patient was instructed to present to the ED for concerns of bacteremia.  Blood cultures repeated while inpatient which confirmed Klebsiella growth.  Patient was started on cefepime  with HD for 2 weeks after line removal with end date on 11/23.  Catheter was removed for line holiday on 11/9, with new catheter placement on 11/12.  TEE inpatient showed no vegetation.  While admitted, patient had persistent diarrhea C. difficile screen positive for antigen and toxin.  Patient was started on vancomycin  4 times daily for 10 days, expected to be complete on 11/30.  While admitted, patient continued to have elevated blood pressures at the 200s/100s.  His baseline appears to be 190s-200s\100-110s.  Continued on his home meds including Carvedilol  25 mg BID, losartan  100 mg daily, nifedipine  90 mg BID, spironolactone  100 mg daily, minoxidil  2.5 mg BID, nephrology recommended hydralazine , patient was started on BiDil 20-37.5, and isosorbide  denitraate was d/c.     PERTINENT  PMH / PSH: ***  OBJECTIVE:   There were no vitals taken for this visit.  ***  ASSESSMENT/PLAN:   Assessment & Plan Bacteremia due to Klebsiella pneumoniae  C. difficile diarrhea  Poorly-controlled hypertension      Gloriann Ogren, MD [facility]

## 2024-03-30 ENCOUNTER — Encounter (HOSPITAL_COMMUNITY): Payer: Self-pay | Admitting: Emergency Medicine

## 2024-03-30 ENCOUNTER — Emergency Department (HOSPITAL_COMMUNITY)

## 2024-03-30 ENCOUNTER — Emergency Department (HOSPITAL_COMMUNITY)
Admission: EM | Admit: 2024-03-30 | Discharge: 2024-03-30 | Discharge status: Against Medical Advice | Attending: Emergency Medicine | Admitting: Emergency Medicine

## 2024-03-30 ENCOUNTER — Other Ambulatory Visit: Payer: Self-pay

## 2024-03-30 DIAGNOSIS — R0602 Shortness of breath: Secondary | ICD-10-CM | POA: Insufficient documentation

## 2024-03-30 DIAGNOSIS — Y828 Other medical devices associated with adverse incidents: Secondary | ICD-10-CM | POA: Diagnosis not present

## 2024-03-30 DIAGNOSIS — Z5321 Procedure and treatment not carried out due to patient leaving prior to being seen by health care provider: Secondary | ICD-10-CM | POA: Diagnosis not present

## 2024-03-30 DIAGNOSIS — T8383XA Hemorrhage of genitourinary prosthetic devices, implants and grafts, initial encounter: Secondary | ICD-10-CM | POA: Insufficient documentation

## 2024-03-30 DIAGNOSIS — Z992 Dependence on renal dialysis: Secondary | ICD-10-CM | POA: Diagnosis not present

## 2024-03-30 LAB — BASIC METABOLIC PANEL WITH GFR
Anion gap: 20 — ABNORMAL HIGH (ref 5–15)
BUN: 42 mg/dL — ABNORMAL HIGH (ref 6–20)
CO2: 26 mmol/L (ref 22–32)
Calcium: 8.9 mg/dL (ref 8.9–10.3)
Chloride: 91 mmol/L — ABNORMAL LOW (ref 98–111)
Creatinine, Ser: 12.35 mg/dL — ABNORMAL HIGH (ref 0.61–1.24)
GFR, Estimated: 5 mL/min — ABNORMAL LOW (ref >60)
Glucose, Bld: 88 mg/dL (ref 70–99)
Potassium: 5.4 mmol/L — ABNORMAL HIGH (ref 3.5–5.1)
Sodium: 137 mmol/L (ref 135–145)

## 2024-03-30 LAB — CBC WITH DIFFERENTIAL/PLATELET
Abs Immature Granulocytes: 0.02 K/uL (ref 0.00–0.07)
Basophils Absolute: 0 K/uL (ref 0.0–0.1)
Basophils Relative: 1 %
Eosinophils Absolute: 0.1 K/uL (ref 0.0–0.5)
Eosinophils Relative: 2 %
HCT: 27.4 % — ABNORMAL LOW (ref 39.0–52.0)
Hemoglobin: 8.4 g/dL — ABNORMAL LOW (ref 13.0–17.0)
Immature Granulocytes: 1 %
Lymphocytes Relative: 28 %
Lymphs Abs: 0.9 K/uL (ref 0.7–4.0)
MCH: 26.9 pg (ref 26.0–34.0)
MCHC: 30.7 g/dL (ref 30.0–36.0)
MCV: 87.8 fL (ref 80.0–100.0)
Monocytes Absolute: 0.4 K/uL (ref 0.1–1.0)
Monocytes Relative: 12 %
Neutro Abs: 1.9 K/uL (ref 1.7–7.7)
Neutrophils Relative %: 56 %
Platelets: 135 K/uL — ABNORMAL LOW (ref 150–400)
RBC: 3.12 MIL/uL — ABNORMAL LOW (ref 4.22–5.81)
RDW: 19.3 % — ABNORMAL HIGH (ref 11.5–15.5)
WBC: 3.3 K/uL — ABNORMAL LOW (ref 4.0–10.5)
nRBC: 0 % (ref 0.0–0.2)

## 2024-03-30 NOTE — ED Notes (Signed)
 PT LEFT SATING HE CONTACTED DIALYSIS AND THEY WILL CHECK OUT HIS ISSUE TOMORROW.

## 2024-03-30 NOTE — ED Triage Notes (Signed)
 Pt c/o increasing bleeding around dialysis catheter over a week.  Pt had dialysis earlier.

## 2024-03-30 NOTE — ED Provider Triage Note (Addendum)
 Emergency Medicine Provider Triage Evaluation Note  [name] , a 38 y.o. male  was evaluated in triage.  Pt complains of dialysis patient with bleeding at right tunneled HD line that was placed on 11/13.  Has had bleeding around catheter site since.  Patient unable to receive dialysis today due to bleeding around catheter site.  Endorses is shortness of breath from not getting dialysis today.  Review of Systems  Positive: Catheter site bleeding, shortness of breath Negative: Fever, chills, nausea, vomiting, dizziness  Physical Exam  BP (!) 205/127 (BP Location: Right Arm)   Pulse 86   Temp 98.2 F (36.8 C)   Resp 18   SpO2 96%  Gen:   Awake, no distress   Resp:  Normal effort  MSK:   Moves extremities without difficulty  Other:    Medical Decision Making  Medically screening exam initiated at 1:57 PM.  Appropriate orders placed.  [name] was informed that the remainder of the evaluation will be completed by another provider, this initial triage assessment does not replace that evaluation, and the importance of remaining in the ED until their evaluation is complete.  Labs ordered   Edward Ileana SAILOR, PA-C 03/30/24 1401    Edward Ileana SAILOR, PA-C 03/30/24 1401

## 2024-03-30 NOTE — ED Triage Notes (Signed)
 Pt states his dialysis catheter is bleeding and center would not treat him today. Last went 2 days ago. Pt endorses SOB from not getting dialysis today.

## 2024-04-01 ENCOUNTER — Inpatient Hospital Stay: Admitting: Family Medicine

## 2024-04-01 NOTE — Progress Notes (Deleted)
    SUBJECTIVE:   CHIEF COMPLAINT / HPI:   Patient is a 38 year old male presents for hospital follow-up. 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  Brief Hospital course:  Patient was seen for HD and blood cultures were positive for Klebsiella.  Source thought to be due to indwelling catheter.  Patient was instructed to present to the ED for concerns of bacteremia.  Blood cultures repeated while inpatient which confirmed Klebsiella growth.  Patient was started on cefepime  with HD for 2 weeks after line removal with end date on 11/23.  Catheter was removed for line holiday on 11/9, with new catheter placement on 11/12.  TEE inpatient showed no vegetation.  While admitted, patient had persistent diarrhea C. difficile screen positive for antigen and toxin.  Patient was started on vancomycin  4 times daily for 10 days, expected to be complete on 11/30.  While admitted, patient continued to have elevated blood pressures at the 200s/100s.  His baseline appears to be 190s-200s\100-110s.  Continued on his home meds including Carvedilol  25 mg BID, losartan  100 mg daily, nifedipine  90 mg BID, spironolactone  100 mg daily, minoxidil  2.5 mg BID, nephrology recommended hydralazine , patient was started on BiDil  20-37.5, and isosorbide  denitraate was d/c.     PERTINENT  PMH / PSH: ***  OBJECTIVE:   There were no vitals taken for this visit.  ***  ASSESSMENT/PLAN:   Assessment & Plan      Gloriann Ogren, MD [facility]

## 2024-04-11 ENCOUNTER — Ambulatory Visit: Admitting: Family Medicine

## 2024-04-14 ENCOUNTER — Encounter: Payer: Self-pay | Admitting: Vascular Surgery

## 2024-04-14 ENCOUNTER — Ambulatory Visit: Admitting: Vascular Surgery

## 2024-04-14 ENCOUNTER — Ambulatory Visit (HOSPITAL_COMMUNITY)
Admission: RE | Admit: 2024-04-14 | Discharge: 2024-04-14 | Disposition: A | Discharge status: Home / Self Care | Source: Ambulatory Visit | Attending: Vascular Surgery | Admitting: Vascular Surgery

## 2024-04-14 VITALS — BP 209/98 | HR 75 | Temp 98.5°F | Resp 20 | Ht 68.0 in | Wt 169.1 lb

## 2024-04-14 DIAGNOSIS — I739 Peripheral vascular disease, unspecified: Secondary | ICD-10-CM | POA: Insufficient documentation

## 2024-04-14 DIAGNOSIS — R6889 Other general symptoms and signs: Secondary | ICD-10-CM | POA: Insufficient documentation

## 2024-04-14 LAB — VAS US ABI WITH/WO TBI

## 2024-04-14 NOTE — Progress Notes (Signed)
 Office Note     CC: Episode of bilateral lower extremity weakness Requesting Provider:  Lonnie Earnest, MD  HPI: [name] is a 38 y.o. (May 14, 1985) male presenting at the request of .Baloch, Mahnoor, MD due to episode of bilateral lower extremity weakness.  The patient is well-known to our service line having previously undergone access surgery.  Care was transferred to [facility] as he and his family were unhappy with the care that he had received at [facility].  On exam today, [name] was doing well.  He stated several weeks ago he had an episode of weakness in his bilateral lower extremities when walking up a flight of stairs.  He had to have help from his cousin to make it to the top.  [name] stated he has not been as active as he has been previously, citing dialysis is a major factor.  Stated that with working out, he feels as though his legs are stronger.  Notes cramping at times, but this is not always associated with ambulation.  Denies symptoms of claudication, ischemic rest pain.  No tissue loss in the feet.  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   Heart murmur    Hypertension    Low blood sugar    Marijuana abuse    Noncompliance    Pericardial effusion 03/28/2023   Tobacco abuse     Past Surgical History:  Procedure Laterality Date   A/V FISTULAGRAM Right 10/27/2022   Procedure: A/V Fistulagram;  Surgeon: [name] RAMAN, MD;  Location: [facility] INVASIVE CV LAB;  Service: Cardiovascular;  Laterality: Right;   A/V FISTULAGRAM Left 01/27/2024   Procedure: A/V Fistulagram;  Surgeon: Edward Fonda BRAVO, MD;  Location: [facility] INVASIVE CV LAB;  Service:  Cardiovascular;  Laterality: Left;   APPENDECTOMY     AV FISTULA PLACEMENT Left 03/03/2019   Procedure: ARTERIOVENOUS (AV) FISTULA CREATION LEFT ARM;  Surgeon: Edward Penne Lonni, MD;  Location: [facility] OR;  Service: Vascular;  Laterality: Left;   BASCILIC VEIN TRANSPOSITION Right 04/12/2019   Procedure: RIGHT UPPER EXTREMITY BASCILIC VEIN TRANSPOSITION FIRST STAGE FISTULA;  Surgeon: [name] RAMAN, MD;  Location: [facility] OR;  Service: Vascular;  Laterality: Right;   BIOPSY  10/14/2021   Procedure: BIOPSY;  Surgeon: Edward Victory LITTIE DOUGLAS, MD;  Location: [facility] ENDOSCOPY;  Service: Gastroenterology;;   BIOPSY  06/06/2022   Procedure: BIOPSY;  Surgeon: Edward Gustav GAILS, MD;  Location: [facility] ENDOSCOPY;  Service: Gastroenterology;;   BIOPSY  11/12/2022   Procedure: BIOPSY;  Surgeon: [name]., MD;  Location: [name] ENDOSCOPY;  Service: Gastroenterology;;   BIOPSY  12/05/2022   Procedure: BIOPSY;  Surgeon: Edward Glendia BRAVO, MD;  Location: [facility] ENDOSCOPY;  Service: Gastroenterology;;   COLONOSCOPY WITH PROPOFOL  N/A 10/14/2021   Procedure: COLONOSCOPY WITH PROPOFOL ;  Surgeon: Edward Victory LITTIE DOUGLAS, MD;  Location: [facility] ENDOSCOPY;  Service: Gastroenterology;  Laterality: N/A;   DIALYSIS/PERMA CATHETER INSERTION N/A 04/03/2023   Procedure: DIALYSIS/PERMA CATHETER INSERTION;  Surgeon: Edward Debby SAILOR, MD;  Location: [facility] INVASIVE CV LAB;  Service: Cardiovascular;  Laterality: N/A;  ESOPHAGOGASTRODUODENOSCOPY N/A 11/12/2022   Procedure: ESOPHAGOGASTRODUODENOSCOPY (EGD);  Surgeon: [name]., MD;  Location: [facility] ENDOSCOPY;  Service: Gastroenterology;  Laterality: N/A;   ESOPHAGOGASTRODUODENOSCOPY N/A 12/05/2022   Procedure: ESOPHAGOGASTRODUODENOSCOPY (EGD);  Surgeon: Edward Glendia BRAVO, MD;  Location: [facility] ENDOSCOPY;  Service: Gastroenterology;  Laterality: N/A;   ESOPHAGOGASTRODUODENOSCOPY (EGD) WITH PROPOFOL  N/A 06/06/2022   Procedure: ESOPHAGOGASTRODUODENOSCOPY (EGD) WITH PROPOFOL ;  Surgeon: Edward Gustav GAILS, MD;  Location: [facility] ENDOSCOPY;  Service: Gastroenterology;  Laterality: N/A;   FISTULA SUPERFICIALIZATION Right 11/07/2022   Procedure: PLICATION OF LARGE ANEURYSMS OF RIGHT ARTERIOVENOUS FISTULA WITH INSERTION OF 7mm INTERPOSITIONAL GORTEX GRAFT;  Surgeon: [name] RAMAN, MD;  Location: [facility] OR;  Service: Vascular;  Laterality: Right;   FLEXIBLE SIGMOIDOSCOPY N/A 04/14/2022   Procedure: FLEXIBLE SIGMOIDOSCOPY;  Surgeon: Edward Gustav GAILS, MD;  Location: [facility] ENDOSCOPY;  Service: Gastroenterology;  Laterality: N/A;   INSERTION OF DIALYSIS CATHETER N/A 11/07/2022   Procedure: INSERTION OF RIGHT INTERNAL JUGULAR TUNNELED DIALYSIS CATHETER;  Surgeon: [name] RAMAN, MD;  Location: [name] OR;  Service: Vascular;  Laterality: N/A;   IR REMOVAL TUN CV CATH W/O FL  03/21/2024   IR TUNNELED CENTRAL VENOUS CATH PLC W IMG  03/22/2024   IR TUNNELED CENTRAL VENOUS CATH PLC W IMG  03/23/2024   LIGATION OF ARTERIOVENOUS  FISTULA Left 03/06/2019   Procedure: LIGATION OF ARTERIOVENOUS  FISTULA;  Surgeon: [name] PARAS, MD;  Location: [facility] OR;  Service: Vascular;  Laterality: Left;   PERIPHERAL VASCULAR BALLOON ANGIOPLASTY  10/27/2022   Procedure: PERIPHERAL VASCULAR BALLOON ANGIOPLASTY;  Surgeon: [name] RAMAN, MD;  Location: [facility] INVASIVE CV LAB;  Service: Cardiovascular;;  rt upper arm fistula   SAVORY DILATION N/A 11/12/2022   Procedure: SAVORY DILATION;  Surgeon: [name]., MD;  Location: [facility] ENDOSCOPY;  Service: Gastroenterology;  Laterality: N/A;   spinal tap     SPINE SURGERY     related to a spinal infection, unsure of surgery or infection source   TRANSESOPHAGEAL ECHOCARDIOGRAM (CATH LAB) N/A 04/02/2023   Procedure: TRANSESOPHAGEAL ECHOCARDIOGRAM;  Surgeon: Jeffrie Oneil BROCKS, MD;  Location: [facility] INVASIVE CV LAB;  Service: Cardiovascular;  Laterality: N/A;   TRANSESOPHAGEAL ECHOCARDIOGRAM (CATH LAB) N/A 03/23/2024   Procedure: TRANSESOPHAGEAL ECHOCARDIOGRAM;  Surgeon:  Sheena Pugh, DO;  Location: [facility] INVASIVE CV LAB;  Service: Cardiovascular;  Laterality: N/A;   WISDOM TOOTH EXTRACTION      Social History   Socioeconomic History   Marital status: Married    Spouse name: Not on file   Number of children: 3   Years of education: Not on file   Highest education level: Not on file  Occupational History   Occupation: HVAC  Tobacco Use   Smoking status: Every Day    Current packs/day: 0.00    Types: Cigarettes    Last attempt to quit: 02/27/2016    Years since quitting: 8.1   Smokeless tobacco: Never   Tobacco comments:    Returned to smoking 2 black and milds per day  Vaping Use   Vaping status: Never Used  Substance and Sexual Activity   Alcohol use: Not Currently    Comment: rare   Drug use: Yes    Types: Marijuana   Sexual activity: Not Currently  Other Topics Concern   Not on file  Social History Narrative   Not on file   Social Drivers of Health   Financial Resource Strain: High Risk (03/15/2024)   Overall Financial Resource Strain (CARDIA)    Difficulty of  Paying Living Expenses: Very hard  Food Insecurity: Food Insecurity Present (03/19/2024)   Hunger Vital Sign    Worried About Running Out of Food in the Last Year: Often true    Ran Out of Food in the Last Year: Sometimes true  Transportation Needs: Patient Declined (03/19/2024)   PRAPARE - Transportation    Lack of Transportation (Medical): Patient declined    Lack of Transportation (Non-Medical): Patient declined  Physical Activity: Inactive (06/02/2023)   Exercise Vital Sign    Days of Exercise per Week: 0 days    Minutes of Exercise per Session: 0 min  Stress: Stress Concern Present (03/15/2024)   [employer] of Occupational Health - Occupational Stress Questionnaire    Feeling of Stress: To some extent  Social Connections: Moderately Isolated (03/19/2024)   Social Connection and Isolation Panel    Frequency of Communication with Friends and Family: More than three  times a week    Frequency of Social Gatherings with Friends and Family: More than three times a week    Attends Religious Services: More than 4 times per year    Active Member of [employer] or Organizations: No    Attends Banker Meetings: Never    Marital Status: Divorced  Catering Manager Violence: Not At Risk (03/19/2024)   Humiliation, Afraid, Rape, and Kick questionnaire    Fear of Current or Ex-Partner: No    Emotionally Abused: No    Physically Abused: No    Sexually Abused: No   Family History  Problem Relation Age of Onset   Hypertension Mother    Kidney failure Mother    Diabetes Mother    Hearing loss Father    Hypertension Sister    Heart disease Maternal Grandmother    Stroke Maternal Grandfather    Asthma Son     Current Outpatient Medications  Medication Sig Dispense Refill   acetaminophen  (TYLENOL ) 500 MG tablet Take 1 tablet (500 mg total) by mouth every 6 (six) hours as needed for mild pain (pain score 1-3) or fever. 30 tablet 0   carvedilol  (COREG ) 25 MG tablet Take 1 tablet (25 mg total) by mouth 2 (two) times daily with a meal. 60 tablet 2   cinacalcet  (SENSIPAR ) 30 MG tablet Take 1 tablet (30 mg total) by mouth every Monday, Wednesday, and Friday at 6 PM. 60 tablet 2   divalproex  (DEPAKOTE  ER) 500 MG 24 hr tablet Take 1 tablet (500 mg total) by mouth daily. 30 tablet 0   isosorbide -hydrALAZINE  (BIDIL ) 20-37.5 MG tablet Take 1 tablet by mouth 3 (three) times daily. 90 tablet 0   losartan  (COZAAR ) 100 MG tablet Take 100 mg by mouth daily.     minoxidil  (LONITEN ) 2.5 MG tablet Take 1 tablet (2.5 mg total) by mouth 2 (two) times daily. 60 tablet 2   NIFEdipine  (ADALAT  CC) 90 MG 24 hr tablet Take 90 mg by mouth in the morning and at bedtime.     oxyCODONE  (OXY IR/ROXICODONE ) 5 MG immediate release tablet Take 1 tablet (5 mg total) by mouth every 6 (six) hours as needed for moderate pain (pain score 4-6). 10 tablet 0   pantoprazole  (PROTONIX ) 40 MG tablet  Take 1 tablet (40 mg total) by mouth daily. 30 tablet 3   sevelamer  carbonate (RENVELA ) 800 MG tablet Take 3 tablets (2,400 mg total) by mouth 3 (three) times daily with meals. 270 tablet 0   No current facility-administered medications for this visit.    Allergies  Allergen  Reactions   Nsaids Other (See Comments)    Contraindication due to ckd    Zestril [Lisinopril] Swelling   Risperdal  [Risperidone ] Other (See Comments)    Unknown reaction      REVIEW OF SYSTEMS:  [X]  denotes positive finding, [ ]  denotes negative finding Cardiac  Comments:  Chest pain or chest pressure:    Shortness of breath upon exertion:    Short of breath when lying flat:    Irregular heart rhythm:        Vascular    Pain in calf, thigh, or hip brought on by ambulation:    Pain in feet at night that wakes you up from your sleep:     Blood clot in your veins:    Leg swelling:         Pulmonary    Oxygen at home:    Productive cough:     Wheezing:         Neurologic    Sudden weakness in arms or legs:     Sudden numbness in arms or legs:     Sudden onset of difficulty speaking or slurred speech:    Temporary loss of vision in one eye:     Problems with dizziness:         Gastrointestinal    Blood in stool:     Vomited blood:         Genitourinary    Burning when urinating:     Blood in urine:        Psychiatric    Major depression:         Hematologic    Bleeding problems:    Problems with blood clotting too easily:        Skin    Rashes or ulcers:        Constitutional    Fever or chills:      PHYSICAL EXAMINATION:  Vitals:   04/14/24 1550  BP: (!) 209/98  Pulse: 75  Resp: 20  Temp: 98.5 F (36.9 C)  TempSrc: Temporal  SpO2: 99%  Weight: 169 lb 1.6 oz (76.7 kg)  Height: 5' 8 (1.727 m)    General:  WDWN in NAD; vital signs documented above Gait: Not observed HENT: WNL, normocephalic Pulmonary: normal non-labored breathing , without wheezing Cardiac: regular  HR Abdomen: soft, NT, no masses Skin: without rashes Vascular Exam/Pulses:  Right Left  Radial 2+ (normal) 2+ (normal)  Ulnar    Femoral    Popliteal    DP 2+ (normal) 2+ (normal)  PT     Extremities: without ischemic changes, without Gangrene , without cellulitis; without open wounds;  Musculoskeletal: no muscle wasting or atrophy  Neurologic: A&O X 3;  No focal weakness or paresthesias are detected Psychiatric:  The pt has Normal affect.   Non-Invasive Vascular Imaging:     ABI Findings:  +---------+------------------+-----+---------+--------+  Right   Rt Pressure (mmHg)IndexWaveform Comment   +---------+------------------+-----+---------+--------+  Brachial 222                                       +---------+------------------+-----+---------+--------+  ATA     >255                   triphasic          +---------+------------------+-----+---------+--------+  PTA     >255  triphasic          +---------+------------------+-----+---------+--------+  Great Toe>255                   Normal             +---------+------------------+-----+---------+--------+   +---------+------------------+-----+---------+-------+  Left    Lt Pressure (mmHg)IndexWaveform Comment  +---------+------------------+-----+---------+-------+  Brachial AVF                                      +---------+------------------+-----+---------+-------+  ATA     >255                   triphasic         +---------+------------------+-----+---------+-------+  PTA     >255                   triphasic         +---------+------------------+-----+---------+-------+  Great Toe>255                   Normal            +---------+------------------+-----+---------+-------+   +-------+-----------+-----------+------------+------------+  ABI/TBIToday's ABIToday's TBIPrevious ABIPrevious TBI   +-------+-----------+-----------+------------+------------+  Right *          *                                    +-------+-----------+-----------+------------+------------+  Left  *          *                                    +-------+-----------+-----------+------------+------------+     ASSESSMENT/PLAN: [name] is a 38 y.o. 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  This is from longstanding dialysis.  He and I had a nice discussion.  We discussed that his waveforms are triphasic, and therefore blood flow is good, with an excellent toe waveform, however he is at future high risk of having microvascular disease due to his comorbidities.  We discussed that the best course of action is continued exercise, and limiting an active healthy lifestyle.  I think he would be best treated with continued monitoring from our office in 1 year intervals.  He was okay with this plan.  I asked him to call our office should any new wounds on the feet arise that are nonhealing.  Recommend ASA 81 mg.    Fonda FORBES Rim, MD [facility] [phone]

## 2024-04-19 ENCOUNTER — Inpatient Hospital Stay: Admitting: Family

## 2024-04-19 ENCOUNTER — Telehealth: Payer: Self-pay

## 2024-04-19 ENCOUNTER — Other Ambulatory Visit: Payer: Self-pay | Admitting: Family

## 2024-04-19 DIAGNOSIS — B961 Klebsiella pneumoniae [K. pneumoniae] as the cause of diseases classified elsewhere: Secondary | ICD-10-CM

## 2024-04-19 NOTE — Telephone Encounter (Signed)
 Patient called to reschedule appointment today. Rescheduled on 12/26. Patient wanted to inform provider he is no longer getting IV abx at HD.  [name] [name], CMA

## 2024-04-21 ENCOUNTER — Ambulatory Visit

## 2024-04-22 ENCOUNTER — Other Ambulatory Visit

## 2024-04-22 ENCOUNTER — Other Ambulatory Visit: Payer: Self-pay

## 2024-04-22 DIAGNOSIS — B961 Klebsiella pneumoniae [K. pneumoniae] as the cause of diseases classified elsewhere: Secondary | ICD-10-CM

## 2024-04-28 LAB — CULTURE, BLOOD (SINGLE)
MICRO NUMBER:: 17350748
MICRO NUMBER:: 17350749

## 2024-04-29 ENCOUNTER — Other Ambulatory Visit: Payer: Self-pay | Admitting: *Deleted

## 2024-04-29 DIAGNOSIS — N186 End stage renal disease: Secondary | ICD-10-CM

## 2024-05-06 ENCOUNTER — Inpatient Hospital Stay: Admitting: Infectious Diseases

## 2024-05-09 ENCOUNTER — Ambulatory Visit: Payer: Self-pay | Admitting: Family

## 2024-05-10 NOTE — Therapy (Deleted)
 " OUTPATIENT PHYSICAL THERAPY THORACOLUMBAR EVALUATION   Patient Name: [name] MRN: [id number] DOB:[birth year], 38 y.o., male Today's Date: 05/10/2024  END OF SESSION:   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   Heart murmur    Hypertension    Low blood sugar    Marijuana abuse    Noncompliance    Pericardial effusion 03/28/2023   Tobacco abuse    Past Surgical History:  Procedure Laterality Date   A/V FISTULAGRAM Right 10/27/2022   Procedure: A/V Fistulagram;  Surgeon: Eliza Lonni RAMAN, MD;  Location: [facility] INVASIVE CV LAB;  Service: Cardiovascular;  Laterality: Right;   A/V FISTULAGRAM Left 01/27/2024   Procedure: A/V Fistulagram;  Surgeon: Lanis Fonda BRAVO, MD;  Location: [facility] INVASIVE CV LAB;  Service: Cardiovascular;  Laterality: Left;   APPENDECTOMY     AV FISTULA PLACEMENT Left 03/03/2019   Procedure: ARTERIOVENOUS (AV) FISTULA CREATION LEFT ARM;  Surgeon: Sheree Penne Lonni, MD;  Location: [facility] OR;  Service: Vascular;  Laterality: Left;   BASCILIC VEIN TRANSPOSITION Right 04/12/2019   Procedure: RIGHT UPPER EXTREMITY BASCILIC VEIN TRANSPOSITION FIRST STAGE FISTULA;  Surgeon: Eliza Lonni RAMAN, MD;  Location: [facility] OR;  Service: Vascular;  Laterality: Right;   BIOPSY  10/14/2021   Procedure: BIOPSY;  Surgeon: Legrand Victory LITTIE DOUGLAS, MD;  Location: [facility] ENDOSCOPY;  Service: Gastroenterology;;   BIOPSY  06/06/2022   Procedure: BIOPSY;  Surgeon: Shila Gustav GAILS, MD;  Location: [facility] ENDOSCOPY;  Service: Gastroenterology;;   BIOPSY  11/12/2022   Procedure: BIOPSY;  Surgeon: Wilhelmenia Aloha Raddle., MD;  Location: [facility] ENDOSCOPY;  Service: Gastroenterology;;    BIOPSY  12/05/2022   Procedure: BIOPSY;  Surgeon: Stacia Glendia BRAVO, MD;  Location: [facility] ENDOSCOPY;  Service: Gastroenterology;;   COLONOSCOPY WITH PROPOFOL  N/A 10/14/2021   Procedure: COLONOSCOPY WITH PROPOFOL ;  Surgeon: Legrand Victory LITTIE DOUGLAS, MD;  Location: [facility] ENDOSCOPY;  Service: Gastroenterology;  Laterality: N/A;   DIALYSIS/PERMA CATHETER INSERTION N/A 04/03/2023   Procedure: DIALYSIS/PERMA CATHETER INSERTION;  Surgeon: Magda Debby SAILOR, MD;  Location: [facility] INVASIVE CV LAB;  Service: Cardiovascular;  Laterality: N/A;   ESOPHAGOGASTRODUODENOSCOPY N/A 11/12/2022   Procedure: ESOPHAGOGASTRODUODENOSCOPY (EGD);  Surgeon: Wilhelmenia Aloha Raddle., MD;  Location: [facility] ENDOSCOPY;  Service: Gastroenterology;  Laterality: N/A;   ESOPHAGOGASTRODUODENOSCOPY N/A 12/05/2022   Procedure: ESOPHAGOGASTRODUODENOSCOPY (EGD);  Surgeon: Stacia Glendia BRAVO, MD;  Location: [facility] ENDOSCOPY;  Service: Gastroenterology;  Laterality: N/A;   ESOPHAGOGASTRODUODENOSCOPY (EGD) WITH PROPOFOL  N/A 06/06/2022   Procedure: ESOPHAGOGASTRODUODENOSCOPY (EGD) WITH PROPOFOL ;  Surgeon: Shila Gustav GAILS, MD;  Location: [facility] ENDOSCOPY;  Service: Gastroenterology;  Laterality: N/A;   FISTULA SUPERFICIALIZATION Right 11/07/2022   Procedure: PLICATION OF LARGE ANEURYSMS OF RIGHT ARTERIOVENOUS FISTULA WITH INSERTION OF 7mm INTERPOSITIONAL GORTEX GRAFT;  Surgeon: Eliza Lonni RAMAN, MD;  Location: [facility] OR;  Service: Vascular;  Laterality: Right;   FLEXIBLE SIGMOIDOSCOPY N/A 04/14/2022   Procedure: FLEXIBLE SIGMOIDOSCOPY;  Surgeon: Shila Gustav GAILS, MD;  Location: [facility] ENDOSCOPY;  Service: Gastroenterology;  Laterality: N/A;   INSERTION OF DIALYSIS CATHETER N/A 11/07/2022   Procedure: INSERTION OF RIGHT INTERNAL JUGULAR TUNNELED DIALYSIS  CATHETER;  Surgeon: Eliza Lonni RAMAN, MD;  Location: [facility] OR;  Service: Vascular;  Laterality: N/A;   IR REMOVAL TUN CV CATH W/O FL  03/21/2024   IR TUNNELED CENTRAL VENOUS CATH PLC W IMG  03/22/2024   IR TUNNELED  CENTRAL VENOUS CATH PLC W IMG  03/23/2024   LIGATION OF ARTERIOVENOUS  FISTULA Left 03/06/2019   Procedure: LIGATION OF ARTERIOVENOUS  FISTULA;  Surgeon: Gretta Lonni PARAS, MD;  Location: [facility] OR;  Service: Vascular;  Laterality: Left;   PERIPHERAL VASCULAR BALLOON ANGIOPLASTY  10/27/2022   Procedure: PERIPHERAL VASCULAR BALLOON ANGIOPLASTY;  Surgeon: Eliza Lonni RAMAN, MD;  Location: [facility] INVASIVE CV LAB;  Service: Cardiovascular;;  rt upper arm fistula   SAVORY DILATION N/A 11/12/2022   Procedure: SAVORY DILATION;  Surgeon: Wilhelmenia Aloha Raddle., MD;  Location: [facility] ENDOSCOPY;  Service: Gastroenterology;  Laterality: N/A;   spinal tap     SPINE SURGERY     related to a spinal infection, unsure of surgery or infection source   TRANSESOPHAGEAL ECHOCARDIOGRAM (CATH LAB) N/A 04/02/2023   Procedure: TRANSESOPHAGEAL ECHOCARDIOGRAM;  Surgeon: Jeffrie Oneil BROCKS, MD;  Location: [facility] INVASIVE CV LAB;  Service: Cardiovascular;  Laterality: N/A;   TRANSESOPHAGEAL ECHOCARDIOGRAM (CATH LAB) N/A 03/23/2024   Procedure: TRANSESOPHAGEAL ECHOCARDIOGRAM;  Surgeon: Sheena Pugh, DO;  Location: [facility] INVASIVE CV LAB;  Service: Cardiovascular;  Laterality: N/A;   WISDOM TOOTH EXTRACTION     Patient Active Problem List   Diagnosis Date Noted   C. 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   Neurological abnormality 08/27/2023   Acne vulgaris 07/22/2023   Moderate pericardial effusion 03/28/2023   End-stage renal disease on hemodialysis (HCC) 12/04/2022   Cannabis dependence with withdrawal (HCC) 11/13/2022   Anemia of chronic disease 11/12/2022   Esophageal dysphagia 11/11/2022   Abnormal barium swallow 11/11/2022   Macrocytic anemia 11/08/2022   A-V fistula 11/07/2022   Gastritis and gastroduodenitis 06/06/2022   Migraines 06/05/2022   Rectal bleeding 04/14/2022   Heart failure with mildly  reduced ejection fraction (HFmrEF) (HCC) 04/08/2022   Food insecurity 01/01/2022   Hearing loss 10/08/2021   Anemia associated with chronic renal failure 03/06/2019   Poorly-controlled hypertension 06/07/2018   Alport syndrome 07/14/2017   GERD (gastroesophageal reflux disease) 07/14/2017   Pre-transplant evaluation for kidney transplant 07/14/2017   Secondary hyperparathyroidism 07/14/2017   Tobacco use 07/14/2017   Bipolar 1 disorder (HCC) 09/01/2013    PCP: Lonnie Earnest, MD  REFERRING PROVIDER: McDiarmid, Krystal, MD  REFERRING DIAG: R53.1 (ICD-10-CM) - Weakness   Rationale for Evaluation and Treatment: Rehabilitation  THERAPY DIAG:  No diagnosis found.  ONSET DATE: ***  SUBJECTIVE:  SUBJECTIVE STATEMENT: ***  PERTINENT HISTORY:  ESRD on dialysis, Anemia, CHF, hard of hearing  PAIN: 05/11/24 Are you having pain? Yes: NPRS scale: *** Pain location: *** Pain description: *** Aggravating factors: *** Relieving factors: ***  PRECAUTIONS: {Therapy precautions:24002}  RED FLAGS: {PT Red Flags:29287}   WEIGHT BEARING RESTRICTIONS: {Yes ***/No:24003}  FALLS:  Has patient fallen in last 6 months? {fallsyesno:27318}  LIVING ENVIRONMENT: Lives with: {[facility] lives with:25569::lives with their family} Lives in: {Lives in:25570} Stairs: {opstairs:27293} Has following equipment at home: {Assistive devices:23999}  OCCUPATION: ***  PLOF: {PLOF:24004}  PATIENT GOALS: ***  NEXT MD VISIT: ***  OBJECTIVE:  Note: Objective measures were completed at Evaluation unless otherwise noted.  DIAGNOSTIC FINDINGS:  ***  PATIENT SURVEYS:  {rehab surveys:24030}  COGNITION: Overall cognitive status: {cognition:24006}     SENSATION: {sensation:27233}  MUSCLE LENGTH: Hamstrings: Right  *** deg; Left *** deg Debby test: Right *** deg; Left *** deg  POSTURE: {posture:25561}  PALPATION: ***  LUMBAR ROM:   AROM eval  Flexion   Extension   Right lateral flexion   Left lateral flexion   Right rotation   Left rotation    (Blank rows = not tested)  LOWER EXTREMITY ROM:     {AROM/PROM:27142}  Right eval Left eval  Hip flexion    Hip extension    Hip abduction    Hip adduction    Hip internal rotation    Hip external rotation    Knee flexion    Knee extension    Ankle dorsiflexion    Ankle plantarflexion    Ankle inversion    Ankle eversion     (Blank rows = not tested)  LOWER EXTREMITY MMT:    MMT Right eval Left eval  Hip flexion    Hip extension    Hip abduction    Hip adduction    Hip internal rotation    Hip external rotation    Knee flexion    Knee extension    Ankle dorsiflexion    Ankle plantarflexion    Ankle inversion    Ankle eversion     (Blank rows = not tested)  LUMBAR SPECIAL TESTS:  {lumbar special test:25242}  FUNCTIONAL TESTS: 05/11/24 5 times sit to stand: *** Timed up and go (TUG): *** 3 minute walk test: ***  GAIT: Distance walked: *** Assistive device utilized: {Assistive devices:23999} Level of assistance: {Levels of assistance:24026} Comments: ***  TREATMENT DATE: ***                                                                                                                                 PATIENT EDUCATION:  Education details: *** Person educated: Patient Education method: Programmer, Multimedia, Facilities Manager, and Handouts Education comprehension: verbalized understanding and returned demonstration  HOME EXERCISE PROGRAM: ***  ASSESSMENT:  CLINICAL IMPRESSION: Patient is a 38 y.o. male who was seen today for physical therapy evaluation and treatment for weakness.  Pt is  on dialysis secondary to ESRD  OBJECTIVE IMPAIRMENTS: {opptimpairments:25111}.   ACTIVITY LIMITATIONS:  {activitylimitations:27494}  PARTICIPATION LIMITATIONS: {participationrestrictions:25113}  PERSONAL FACTORS: Time since onset of injury/illness/exacerbation and 1-2 comorbidities: ESRD, dialysis, CHF are also affecting patient's functional outcome.   REHAB POTENTIAL: Good  CLINICAL DECISION MAKING: Evolving/moderate complexity  EVALUATION COMPLEXITY: Moderate   GOALS: Goals reviewed with patient? Yes  SHORT TERM GOALS: Target date: 06/08/2024    Be independent in initial HEP Baseline: Goal status: INITIAL  2.  *** Baseline:  Goal status: INITIAL  3.  *** Baseline:  Goal status: INITIAL  4.  *** Baseline:  Goal status: INITIAL  5.  *** Baseline:  Goal status: INITIAL  6.  *** Baseline:  Goal status: INITIAL  LONG TERM GOALS: Target date: 07/06/2024    Be independent in advanced HEP Baseline:  Goal status: INITIAL  2.  *** Baseline:  Goal status: INITIAL  3.  *** Baseline:  Goal status: INITIAL  4.  *** Baseline:  Goal status: INITIAL  5.  *** Baseline:  Goal status: INITIAL  6.  *** Baseline:  Goal status: INITIAL  PLAN:  PT FREQUENCY: 2x/week  PT DURATION: 8 weeks  PLANNED INTERVENTIONS: 97110-Therapeutic exercises, 97530- Therapeutic activity, 97112- Neuromuscular re-education, 97535- Self Care, 02859- Manual therapy, [id number]- Canalith repositioning, [id number]- Aquatic Therapy, 79439 (1-2 muscles), 20561 (3+ muscles)- Dry Needling, Patient/Family education, Joint mobilization, Joint manipulation, Spinal manipulation, Spinal mobilization, Vestibular training, Cryotherapy, and Moist heat.  PLAN FOR NEXT SESSION: ***   Burnard Joy, PT 05/10/2024 10:04 AM   [facility] [address], [address] [place], [place] 04/22/1986 Phone # [phone] Fax [phone]  "

## 2024-05-11 ENCOUNTER — Ambulatory Visit

## 2024-05-11 NOTE — Telephone Encounter (Signed)
 PT called pt due to no-show for evaluation.  He was at dialysis and didn't get out in time.  Rescheduled appt for 05/26/24.

## 2024-05-17 ENCOUNTER — Telehealth: Payer: Self-pay

## 2024-05-17 ENCOUNTER — Ambulatory Visit: Payer: Self-pay | Admitting: Family Medicine

## 2024-05-17 NOTE — Telephone Encounter (Signed)
 Patient calls nurse line in regards to apt today.   He reports he had to cancel this due to his current GI upset. He reports he has been having diarrhea on/off since his hospital stay. However, her reports last night into this morning he has pooped 10 plus times.   He denies any fevers, chills, body aches, nausea, vomiting or blood.   He reports he cancelled his apt because he was afraid he would have an accident in the car.  He is requesting medication to help with his symptoms so he can leave the house.   ED precautions discussed with patient. Discussed hydration.   Advised will forward to PCP.

## 2024-05-19 ENCOUNTER — Ambulatory Visit (INDEPENDENT_AMBULATORY_CARE_PROVIDER_SITE_OTHER): Admitting: Vascular Surgery

## 2024-05-19 ENCOUNTER — Ambulatory Visit (HOSPITAL_COMMUNITY)
Admission: RE | Admit: 2024-05-19 | Discharge: 2024-05-19 | Disposition: A | Discharge status: Home / Self Care | Source: Ambulatory Visit | Attending: Vascular Surgery | Admitting: Vascular Surgery

## 2024-05-19 ENCOUNTER — Inpatient Hospital Stay: Admitting: Internal Medicine

## 2024-05-19 ENCOUNTER — Encounter: Payer: Self-pay | Admitting: Vascular Surgery

## 2024-05-19 ENCOUNTER — Ambulatory Visit (HOSPITAL_BASED_OUTPATIENT_CLINIC_OR_DEPARTMENT_OTHER)
Admission: RE | Admit: 2024-05-19 | Discharge: 2024-05-19 | Disposition: A | Discharge status: Home / Self Care | Source: Ambulatory Visit | Attending: Vascular Surgery | Admitting: Vascular Surgery

## 2024-05-19 VITALS — BP 171/82 | HR 75 | Temp 98.4°F | Resp 18 | Ht 68.0 in | Wt 167.7 lb

## 2024-05-19 DIAGNOSIS — Z992 Dependence on renal dialysis: Secondary | ICD-10-CM

## 2024-05-19 DIAGNOSIS — N186 End stage renal disease: Secondary | ICD-10-CM | POA: Insufficient documentation

## 2024-05-19 DIAGNOSIS — T82590A Other mechanical complication of surgically created arteriovenous fistula, initial encounter: Secondary | ICD-10-CM | POA: Diagnosis present

## 2024-05-19 NOTE — Progress Notes (Signed)
 " Office Note    HPI: KHYAN OATS is a 39 y.o. (Apr 09, 1986) male presenting at the request of Dr. Dolan for long-term HD access.  The patient has received dialysis access care by our group, but left our practice after being very unhappy with [place].  He now receives access care by [facility] vascular surgery.  Patient went left arm AV graft revision in 11/24/2023, with  Dr. Teddie.  Most recent fistulogram in September demonstrated the graft was widely patent.  I recently saw him due to concern for lower extremity PAD.  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  This is from longstanding dialysis. We discussed that his waveforms are triphasic, and therefore blood flow is good, with an excellent toe waveform, however he is at future high risk of having microvascular disease due to his comorbidities.   On exam today, [name]'s was doing well.  Unfortunately, he was recently denied referral to [facility] for transplant due to missing several appointments. States that he continues to use his catheter as they are unable to cannulate his graft, when they do, it pulls clot.  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   Heart murmur    Hypertension    Low blood sugar    Marijuana abuse    Noncompliance    Pericardial effusion 03/28/2023    Tobacco abuse     Past Surgical History:  Procedure Laterality Date   A/V FISTULAGRAM Right 10/27/2022   Procedure: A/V Fistulagram;  Surgeon: Eliza Lonni RAMAN, MD;  Location: [facility] INVASIVE CV LAB;  Service: Cardiovascular;  Laterality: Right;   A/V FISTULAGRAM Left 01/27/2024   Procedure: A/V Fistulagram;  Surgeon: Lanis Fonda BRAVO, MD;  Location: [facility] INVASIVE CV LAB;  Service: Cardiovascular;  Laterality: Left;   APPENDECTOMY     AV FISTULA PLACEMENT Left 03/03/2019   Procedure: ARTERIOVENOUS (AV) FISTULA CREATION LEFT ARM;  Surgeon: Sheree Penne Lonni, MD;  Location: [facility] OR;  Service: Vascular;  Laterality: Left;   BASCILIC VEIN TRANSPOSITION Right 04/12/2019   Procedure: RIGHT UPPER EXTREMITY BASCILIC VEIN TRANSPOSITION FIRST STAGE FISTULA;  Surgeon: Eliza Lonni RAMAN, MD;  Location: [facility] OR;  Service: Vascular;  Laterality: Right;   BIOPSY  10/14/2021   Procedure: BIOPSY;  Surgeon: Legrand Victory LITTIE DOUGLAS, MD;  Location: [facility] ENDOSCOPY;  Service: Gastroenterology;;   BIOPSY  06/06/2022   Procedure: BIOPSY;  Surgeon: Shila Gustav GAILS, MD;  Location: [facility] ENDOSCOPY;  Service: Gastroenterology;;   BIOPSY  11/12/2022   Procedure: BIOPSY;  Surgeon: Wilhelmenia Aloha Raddle., MD;  Location: [facility] ENDOSCOPY;  Service: Gastroenterology;;   BIOPSY  12/05/2022   Procedure: BIOPSY;  Surgeon: Stacia Glendia BRAVO, MD;  Location: [facility] ENDOSCOPY;  Service: Gastroenterology;;   COLONOSCOPY WITH PROPOFOL  N/A 10/14/2021   Procedure: COLONOSCOPY WITH PROPOFOL ;  Surgeon:  Legrand Victory LITTIE DOUGLAS, MD;  Location: [facility];  Service: Gastroenterology;  Laterality: N/A;   DIALYSIS/PERMA CATHETER INSERTION N/A 04/03/2023   Procedure: DIALYSIS/PERMA CATHETER INSERTION;  Surgeon: Magda Debby SAILOR, MD;  Location: [facility] INVASIVE CV LAB;  Service: Cardiovascular;  Laterality: N/A;   ESOPHAGOGASTRODUODENOSCOPY N/A 11/12/2022   Procedure: ESOPHAGOGASTRODUODENOSCOPY (EGD);  Surgeon: Wilhelmenia Aloha Raddle., MD;  Location: [facility]  ENDOSCOPY;  Service: Gastroenterology;  Laterality: N/A;   ESOPHAGOGASTRODUODENOSCOPY N/A 12/05/2022   Procedure: ESOPHAGOGASTRODUODENOSCOPY (EGD);  Surgeon: Stacia Glendia BRAVO, MD;  Location: [facility] ENDOSCOPY;  Service: Gastroenterology;  Laterality: N/A;   ESOPHAGOGASTRODUODENOSCOPY (EGD) WITH PROPOFOL  N/A 06/06/2022   Procedure: ESOPHAGOGASTRODUODENOSCOPY (EGD) WITH PROPOFOL ;  Surgeon: Shila Gustav GAILS, MD;  Location: [facility] ENDOSCOPY;  Service: Gastroenterology;  Laterality: N/A;   FISTULA SUPERFICIALIZATION Right 11/07/2022   Procedure: PLICATION OF LARGE ANEURYSMS OF RIGHT ARTERIOVENOUS FISTULA WITH INSERTION OF 7mm INTERPOSITIONAL GORTEX GRAFT;  Surgeon: Eliza Lonni RAMAN, MD;  Location: [facility] OR;  Service: Vascular;  Laterality: Right;   FLEXIBLE SIGMOIDOSCOPY N/A 04/14/2022   Procedure: FLEXIBLE SIGMOIDOSCOPY;  Surgeon: Shila Gustav GAILS, MD;  Location: [facility] ENDOSCOPY;  Service: Gastroenterology;  Laterality: N/A;   INSERTION OF DIALYSIS CATHETER N/A 11/07/2022   Procedure: INSERTION OF RIGHT INTERNAL JUGULAR TUNNELED DIALYSIS CATHETER;  Surgeon: Eliza Lonni RAMAN, MD;  Location: [facility] OR;  Service: Vascular;  Laterality: N/A;   IR REMOVAL TUN CV CATH W/O FL  03/21/2024   IR TUNNELED CENTRAL VENOUS CATH PLC W IMG  03/22/2024   IR TUNNELED CENTRAL VENOUS CATH PLC W IMG  03/23/2024   LIGATION OF ARTERIOVENOUS  FISTULA Left 03/06/2019   Procedure: LIGATION OF ARTERIOVENOUS  FISTULA;  Surgeon: Gretta Lonni PARAS, MD;  Location: [facility] OR;  Service: Vascular;  Laterality: Left;   PERIPHERAL VASCULAR BALLOON ANGIOPLASTY  10/27/2022   Procedure: PERIPHERAL VASCULAR BALLOON ANGIOPLASTY;  Surgeon: Eliza Lonni RAMAN, MD;  Location: [facility] INVASIVE CV LAB;  Service: Cardiovascular;;  rt upper arm fistula   SAVORY DILATION N/A 11/12/2022   Procedure: SAVORY DILATION;  Surgeon: Wilhelmenia Aloha Raddle., MD;  Location: [facility] ENDOSCOPY;  Service: Gastroenterology;  Laterality: N/A;   spinal tap     SPINE SURGERY      related to a spinal infection, unsure of surgery or infection source   TRANSESOPHAGEAL ECHOCARDIOGRAM (CATH LAB) N/A 04/02/2023   Procedure: TRANSESOPHAGEAL ECHOCARDIOGRAM;  Surgeon: Jeffrie Oneil BROCKS, MD;  Location: [facility] INVASIVE CV LAB;  Service: Cardiovascular;  Laterality: N/A;   TRANSESOPHAGEAL ECHOCARDIOGRAM (CATH LAB) N/A 03/23/2024   Procedure: TRANSESOPHAGEAL ECHOCARDIOGRAM;  Surgeon: Sheena Pugh, DO;  Location: [facility] INVASIVE CV LAB;  Service: Cardiovascular;  Laterality: N/A;   WISDOM TOOTH EXTRACTION      Social History   Socioeconomic History   Marital status: Married    Spouse name: Not on file   Number of children: 3   Years of education: Not on file   Highest education level: Not on file  Occupational History   Occupation: HVAC  Tobacco Use   Smoking status: Every Day    Current packs/day: 0.00    Average packs/day: 0.3 packs/day    Types: Cigarettes    Last attempt to quit: 02/27/2016    Years since quitting: 8.2   Smokeless tobacco: Never   Tobacco comments:    Returned to smoking 2 black and milds per day  Vaping Use   Vaping status: Never Used  Substance and Sexual Activity   Alcohol use: Not Currently    Comment: rare   Drug use: Yes  Types: Marijuana   Sexual activity: Not Currently  Other Topics Concern   Not on file  Social History Narrative   Not on file   Social Drivers of Health   Tobacco Use: High Risk (04/14/2024)   Patient History    Smoking Tobacco Use: Every Day    Smokeless Tobacco Use: Never    Passive Exposure: Not on file  Financial Resource Strain: High Risk (03/15/2024)   Overall Financial Resource Strain (CARDIA)    Difficulty of Paying Living Expenses: Very hard  Food Insecurity: Food Insecurity Present (03/19/2024)   Epic    Worried About Programme Researcher, Broadcasting/film/video in the Last Year: Often true    Ran Out of Food in the Last Year: Sometimes true  Transportation Needs: Patient Declined (03/19/2024)   Epic    Lack of Transportation  (Medical): Patient declined    Lack of Transportation (Non-Medical): Patient declined  Physical Activity: Inactive (06/02/2023)   Exercise Vital Sign    Days of Exercise per Week: 0 days    Minutes of Exercise per Session: 0 min  Stress: Stress Concern Present (03/15/2024)   [employer] of Occupational Health - Occupational Stress Questionnaire    Feeling of Stress: To some extent  Social Connections: Moderately Isolated (03/19/2024)   Social Connection and Isolation Panel    Frequency of Communication with Friends and Family: More than three times a week    Frequency of Social Gatherings with Friends and Family: More than three times a week    Attends Religious Services: More than 4 times per year    Active Member of [employer] or Organizations: No    Attends Banker Meetings: Never    Marital Status: Divorced  Catering Manager Violence: Not At Risk (03/19/2024)   Epic    Fear of Current or Ex-Partner: No    Emotionally Abused: No    Physically Abused: No    Sexually Abused: No  Depression (PHQ2-9): Medium Risk (06/02/2023)   Depression (PHQ2-9)    PHQ-2 Score: 7  Alcohol Screen: Not on file  Housing: High Risk (03/19/2024)   Epic    Unable to Pay for Housing in the Last Year: Yes    Number of Times Moved in the Last Year: 1    Homeless in the Last Year: Patient declined  Utilities: Not At Risk (03/19/2024)   Epic    Threatened with loss of utilities: No  Health Literacy: Not on file   Family History  Problem Relation Age of Onset   Hypertension Mother    Kidney failure Mother    Diabetes Mother    Hearing loss Father    Hypertension Sister    Heart disease Maternal Grandmother    Stroke Maternal Grandfather    Asthma Son     Current Outpatient Medications  Medication Sig Dispense Refill   acetaminophen  (TYLENOL ) 500 MG tablet Take 1 tablet (500 mg total) by mouth every 6 (six) hours as needed for mild pain (pain score 1-3) or fever. 30 tablet 0    carvedilol  (COREG ) 25 MG tablet Take 1 tablet (25 mg total) by mouth 2 (two) times daily with a meal. 60 tablet 2   cinacalcet  (SENSIPAR ) 30 MG tablet Take 1 tablet (30 mg total) by mouth every Monday, Wednesday, and Friday at 6 PM. 60 tablet 2   divalproex  (DEPAKOTE  ER) 500 MG 24 hr tablet Take 1 tablet (500 mg total) by mouth daily. 30 tablet 0   losartan  (COZAAR ) 100 MG  tablet Take 100 mg by mouth daily.     minoxidil  (LONITEN ) 2.5 MG tablet Take 1 tablet (2.5 mg total) by mouth 2 (two) times daily. 60 tablet 2   NIFEdipine  (ADALAT  CC) 90 MG 24 hr tablet Take 90 mg by mouth in the morning and at bedtime.     oxyCODONE  (OXY IR/ROXICODONE ) 5 MG immediate release tablet Take 1 tablet (5 mg total) by mouth every 6 (six) hours as needed for moderate pain (pain score 4-6). 10 tablet 0   pantoprazole  (PROTONIX ) 40 MG tablet Take 1 tablet (40 mg total) by mouth daily. 30 tablet 3   No current facility-administered medications for this visit.    Allergies  Allergen Reactions   Nsaids Other (See Comments)    Contraindication due to ckd    Zestril [Lisinopril] Swelling   Risperdal  [Risperidone ] Other (See Comments)    Unknown reaction      REVIEW OF SYSTEMS:  [X]  denotes positive finding, [ ]  denotes negative finding Cardiac  Comments:  Chest pain or chest pressure:    Shortness of breath upon exertion:    Short of breath when lying flat:    Irregular heart rhythm:        Vascular    Pain in calf, thigh, or hip brought on by ambulation:    Pain in feet at night that wakes you up from your sleep:     Blood clot in your veins:    Leg swelling:         Pulmonary    Oxygen at home:    Productive cough:     Wheezing:         Neurologic    Sudden weakness in arms or legs:     Sudden numbness in arms or legs:     Sudden onset of difficulty speaking or slurred speech:    Temporary loss of vision in one eye:     Problems with dizziness:         Gastrointestinal    Blood in stool:      Vomited blood:         Genitourinary    Burning when urinating:     Blood in urine:        Psychiatric    Major depression:         Hematologic    Bleeding problems:    Problems with blood clotting too easily:        Skin    Rashes or ulcers:        Constitutional    Fever or chills:      PHYSICAL EXAMINATION:  There were no vitals filed for this visit.   General:  WDWN in NAD; vital signs documented above Gait: Not observed HENT: WNL, normocephalic Pulmonary: normal non-labored breathing , without wheezing Cardiac: regular HR Abdomen: soft, NT, no masses Skin: without rashes Vascular Exam/Pulses:  Right Left  Radial 2+ (normal) 2+ (normal)  Ulnar    Femoral    Popliteal    DP 2+ (normal) 2+ (normal)  PT     Extremities: without ischemic changes, without Gangrene , without cellulitis; without open wounds;  Palpable thrill within the left arm AV graft Musculoskeletal: no muscle wasting or atrophy  Neurologic: A&O X 3;  No focal weakness or paresthesias are detected Psychiatric:  The pt has Normal affect.   Non-Invasive Vascular Imaging:      ASSESSMENT/PLAN: SANDRA TELLEFSEN is a 39 y.o. male presenting with left arm AV  graft malfunction.  The graft was placed at [facility], and revised at [facility] over the summer after the patient and his family were very unhappy with the care he received at [facility].    Keiron and I had a nice discussion regarding his access.  On physical exam, he has a palpable thrill within the AV graft.  I am surprised that they are not able to access it.  Fistulogram from September demonstrated widely patent graft.  He was honest and open about the fact that his family does not want him to receive care at [facility].  I told him that I would be happy to provide him a referral back to [facility] to have the AV graft assessed.  He stated if they could not fix it, his family wanted him to go to [facility].      Fonda FORBES Rim,  MD [facility] [phone]  "

## 2024-05-19 NOTE — Telephone Encounter (Signed)
 Patient scheduled for tomorrow for evaluation.

## 2024-05-20 ENCOUNTER — Ambulatory Visit: Payer: Self-pay

## 2024-05-23 ENCOUNTER — Ambulatory Visit

## 2024-05-24 ENCOUNTER — Ambulatory Visit

## 2024-05-24 VITALS — BP 157/80 | HR 84 | Wt 161.2 lb

## 2024-05-24 DIAGNOSIS — K529 Noninfective gastroenteritis and colitis, unspecified: Secondary | ICD-10-CM | POA: Diagnosis present

## 2024-05-24 NOTE — Progress Notes (Signed)
" ° ° °  SUBJECTIVE:   CHIEF COMPLAINT / HPI:   Acute on Chronic Diarrhea - Comes and goes, but hasn't really gone away since his hospitalization, discharged 2 months ago - [place] Stool Chart: 5-7 - Peanut products can oftentimes upset his stomach which he has eaten recently - Primarily occurs in the morning, about 10 times this morning - Drinking fluids, but being mindful of his ESRD and HD - Feeling ok overall and able to handle dialysis, last session yesterday - Sometimes eating helps keep his diarrhea at bay for that day, but worsens the next day - He also feels like his bowel movements fluctuate between constipation and diarrhea, sometimes has bleeding d/t constipation.  - Had dark blood in his diarrhea twice yesterday, but he pooped numerous times yesterday, however there is no bleeding today. - Expresses having some stomach cramping  - Patient was seen by gastroenterology in May 2025 for very similar complaints.  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  At that time a pancreatic fecal elastase and C. difficile were checked, but never collected. - Patient was recently hospitalized and tested positive for C. difficile and was treated with oral vancomycin  for 10 days.  PERTINENT  PMH / PSH: Alport Syndrome, ESRD on HD, HTN  OBJECTIVE:   BP (!) 157/80 Comment: pt refused to pull sleeve up-Dr. is aware  Pulse 84   Wt 161 lb 3.2 oz (73.1 kg)   SpO2 99%   BMI 24.51 kg/m   General: Awake and Alert in NAD HEENT: NCAT. Sclera anicteric. No rhinorrhea. Cardiovascular: RRR. No M/R/G Respiratory: CTAB, normal WOB on RA. No wheezing, crackles, rhonchi, or diminished breath sounds. Abdomen: Soft, non-tender, non-distended. Bowel sounds normoactivee. Extremities: Able to move all extremities.  Skin: Warm and dry. Neuro: A&Ox3. No focal neurological deficits.  ASSESSMENT/PLAN:   Assessment & Plan Chronic diarrhea Acute on chronic diarrhea, with no medications at this time outside of his normal  regimen.  Has a recent history of C. difficile and being hospitalized multiple times, discussed with the patient that it would be beneficial to retest for C. difficile at this time.  Also considering IBS-D due to chronicity and patient's symptoms fluctuating between constipation and diarrhea. - C. difficile and pancreatic fecal elastase ordered; collection kit and instructions provided to the patient, advised to return ASAP - Gastroenterology recommended Imodium , dicyclomine , or Lomotil to help with chronic diarrhea possibly secondary to IBS-D in the past, will consider once results of C. difficile testing return in order to r/o if repeat course of antibiotics is required  - May need reevaluation by gastroenterology if symptoms continue to persist   Advised patient to follow-up with Dr. Lonnie for hospital follow-up - appt on 1/22 at 10:10a.  Patient expressed understanding.   Kathrine Melena, DO [facility] "

## 2024-05-24 NOTE — Patient Instructions (Addendum)
 It was great to see you today! Thank you for choosing [facility] for your primary care. [name] was seen for chronic diarrhea.  Today we addressed: Checking C diff - if results are negative will treat with a medication to help with the diarrhea, if positive will treat with antibiotics Follow up with Dr. Lonnie on 1/22 at 10:10 AM for hospital follow up  You should return to our clinic No follow-ups on file. Please arrive 15 minutes before your appointment to ensure smooth check in process.  We appreciate your efforts in making this happen.  Thank you for allowing me to participate in your care, Kathrine Melena, DO 05/24/2024, 4:57 PM PGY-2, [facility]

## 2024-05-25 ENCOUNTER — Other Ambulatory Visit: Payer: Self-pay

## 2024-05-25 NOTE — Therapy (Incomplete)
 " OUTPATIENT PHYSICAL THERAPY LOWER EXTREMITY EVALUATION   Patient Name: [name] MRN: [id number] DOB:February 29, 1986, 39 y.o., male Today's Date: 05/25/2024  END OF SESSION:   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   Heart murmur    Hypertension    Low blood sugar    Marijuana abuse    Noncompliance    Pericardial effusion 03/28/2023   Tobacco abuse    Past Surgical History:  Procedure Laterality Date   A/V FISTULAGRAM Right 10/27/2022   Procedure: A/V Fistulagram;  Surgeon: Eliza Lonni RAMAN, MD;  Location: [facility] INVASIVE CV LAB;  Service: Cardiovascular;  Laterality: Right;   A/V FISTULAGRAM Left 01/27/2024   Procedure: A/V Fistulagram;  Surgeon: Lanis Fonda BRAVO, MD;  Location: [facility] INVASIVE CV LAB;  Service: Cardiovascular;  Laterality: Left;   APPENDECTOMY     AV FISTULA PLACEMENT Left 03/03/2019   Procedure: ARTERIOVENOUS (AV) FISTULA CREATION LEFT ARM;  Surgeon: Sheree Penne Lonni, MD;  Location: [facility] OR;  Service: Vascular;  Laterality: Left;   BASCILIC VEIN TRANSPOSITION Right 04/12/2019   Procedure: RIGHT UPPER EXTREMITY BASCILIC VEIN TRANSPOSITION FIRST STAGE FISTULA;  Surgeon: Eliza Lonni RAMAN, MD;  Location: [facility] OR;  Service: Vascular;  Laterality: Right;   BIOPSY  10/14/2021   Procedure: BIOPSY;  Surgeon: Legrand Victory LITTIE DOUGLAS, MD;  Location: [facility] ENDOSCOPY;  Service: Gastroenterology;;   BIOPSY  06/06/2022   Procedure: BIOPSY;  Surgeon: Shila Gustav GAILS, MD;  Location: [facility] ENDOSCOPY;  Service: Gastroenterology;;   BIOPSY  11/12/2022   Procedure: BIOPSY;  Surgeon: Wilhelmenia Aloha Raddle., MD;  Location: [facility] ENDOSCOPY;  Service: Gastroenterology;;    BIOPSY  12/05/2022   Procedure: BIOPSY;  Surgeon: Stacia Glendia BRAVO, MD;  Location: [facility] ENDOSCOPY;  Service: Gastroenterology;;   COLONOSCOPY WITH PROPOFOL  N/A 10/14/2021   Procedure: COLONOSCOPY WITH PROPOFOL ;  Surgeon: Legrand Victory LITTIE DOUGLAS, MD;  Location: [facility] ENDOSCOPY;  Service: Gastroenterology;  Laterality: N/A;   DIALYSIS/PERMA CATHETER INSERTION N/A 04/03/2023   Procedure: DIALYSIS/PERMA CATHETER INSERTION;  Surgeon: Magda Debby SAILOR, MD;  Location: [facility] INVASIVE CV LAB;  Service: Cardiovascular;  Laterality: N/A;   ESOPHAGOGASTRODUODENOSCOPY N/A 11/12/2022   Procedure: ESOPHAGOGASTRODUODENOSCOPY (EGD);  Surgeon: Wilhelmenia Aloha Raddle., MD;  Location: [facility] ENDOSCOPY;  Service: Gastroenterology;  Laterality: N/A;   ESOPHAGOGASTRODUODENOSCOPY N/A 12/05/2022   Procedure: ESOPHAGOGASTRODUODENOSCOPY (EGD);  Surgeon: Stacia Glendia BRAVO, MD;  Location: [facility] ENDOSCOPY;  Service: Gastroenterology;  Laterality: N/A;   ESOPHAGOGASTRODUODENOSCOPY (EGD) WITH PROPOFOL  N/A 06/06/2022   Procedure: ESOPHAGOGASTRODUODENOSCOPY (EGD) WITH PROPOFOL ;  Surgeon: Shila Gustav GAILS, MD;  Location: [facility] ENDOSCOPY;  Service: Gastroenterology;  Laterality: N/A;   FISTULA SUPERFICIALIZATION Right 11/07/2022   Procedure: PLICATION OF LARGE ANEURYSMS OF RIGHT ARTERIOVENOUS FISTULA WITH INSERTION OF 7mm INTERPOSITIONAL GORTEX GRAFT;  Surgeon: Eliza Lonni RAMAN, MD;  Location: [facility] OR;  Service: Vascular;  Laterality: Right;   FLEXIBLE SIGMOIDOSCOPY N/A 04/14/2022   Procedure: FLEXIBLE SIGMOIDOSCOPY;  Surgeon: Shila Gustav GAILS, MD;  Location: [facility] ENDOSCOPY;  Service: Gastroenterology;  Laterality: N/A;   INSERTION OF DIALYSIS CATHETER N/A 11/07/2022   Procedure: INSERTION OF RIGHT INTERNAL JUGULAR TUNNELED  DIALYSIS CATHETER;  Surgeon: Eliza Lonni RAMAN, MD;  Location: [facility] OR;  Service: Vascular;  Laterality: N/A;   IR REMOVAL TUN CV CATH W/O FL  03/21/2024   IR TUNNELED CENTRAL VENOUS CATH PLC W IMG  03/22/2024   IR TUNNELED  CENTRAL VENOUS CATH PLC W IMG  03/23/2024   LIGATION OF ARTERIOVENOUS  FISTULA Left 03/06/2019   Procedure: LIGATION OF ARTERIOVENOUS  FISTULA;  Surgeon: Gretta Lonni PARAS, MD;  Location: [facility] OR;  Service: Vascular;  Laterality: Left;   PERIPHERAL VASCULAR BALLOON ANGIOPLASTY  10/27/2022   Procedure: PERIPHERAL VASCULAR BALLOON ANGIOPLASTY;  Surgeon: Eliza Lonni RAMAN, MD;  Location: [facility] INVASIVE CV LAB;  Service: Cardiovascular;;  rt upper arm fistula   SAVORY DILATION N/A 11/12/2022   Procedure: SAVORY DILATION;  Surgeon: Wilhelmenia Aloha Raddle., MD;  Location: [facility] ENDOSCOPY;  Service: Gastroenterology;  Laterality: N/A;   spinal tap     SPINE SURGERY     related to a spinal infection, unsure of surgery or infection source   TRANSESOPHAGEAL ECHOCARDIOGRAM (CATH LAB) N/A 04/02/2023   Procedure: TRANSESOPHAGEAL ECHOCARDIOGRAM;  Surgeon: Jeffrie Oneil BROCKS, MD;  Location: [facility] INVASIVE CV LAB;  Service: Cardiovascular;  Laterality: N/A;   TRANSESOPHAGEAL ECHOCARDIOGRAM (CATH LAB) N/A 03/23/2024   Procedure: TRANSESOPHAGEAL ECHOCARDIOGRAM;  Surgeon: Sheena Pugh, DO;  Location: [facility] INVASIVE CV LAB;  Service: Cardiovascular;  Laterality: N/A;   WISDOM TOOTH EXTRACTION     Patient Active Problem List   Diagnosis Date Noted   C. 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   Neurological abnormality 08/27/2023   Acne vulgaris 07/22/2023   Moderate pericardial effusion 03/28/2023   End-stage renal disease on hemodialysis (HCC) 12/04/2022   Cannabis dependence with withdrawal (HCC) 11/13/2022   Anemia of chronic disease 11/12/2022   Esophageal dysphagia 11/11/2022   Abnormal barium swallow 11/11/2022   Macrocytic anemia 11/08/2022   A-V fistula 11/07/2022   Gastritis and gastroduodenitis 06/06/2022   Migraines 06/05/2022   Rectal bleeding 04/14/2022   Heart failure with mildly  reduced ejection fraction (HFmrEF) (HCC) 04/08/2022   Food insecurity 01/01/2022   Hearing loss 10/08/2021   Anemia associated with chronic renal failure 03/06/2019   Poorly-controlled hypertension 06/07/2018   Alport syndrome 07/14/2017   GERD (gastroesophageal reflux disease) 07/14/2017   Pre-transplant evaluation for kidney transplant 07/14/2017   Secondary hyperparathyroidism 07/14/2017   Tobacco use 07/14/2017   Bipolar 1 disorder (HCC) 09/01/2013    PCP: Lonnie Earnest, MD   REFERRING PROVIDER: McDiarmid, Krystal BIRCH, MD  REFERRING DIAG:  Diagnosis  R78.81 (ICD-10-CM) - Bacteremia  R53.1 (ICD-10-CM) - Weakness    THERAPY DIAG:  No diagnosis found.  Rationale for Evaluation and Treatment: Rehabilitation  ONSET DATE: ***  SUBJECTIVE:   SUBJECTIVE STATEMENT: ***  PERTINENT HISTORY: CHF; End stage renal disease; HTN; bipolar 1 disorder; depression; alpot syndrome; hard of hearing ; Dialysis MWFS PAIN:  Are you having pain? Yes: NPRS scale: *** Pain location: *** Pain description: *** Aggravating factors: *** Relieving factors: ***  PRECAUTIONS: {Therapy precautions:24002}  RED FLAGS: {PT Red Flags:29287}   WEIGHT BEARING RESTRICTIONS: {Yes ***/No:24003}  FALLS:  Has patient fallen in last 6 months? {fallsyesno:27318}  LIVING ENVIRONMENT: Lives with: {[facility] lives with:25569::lives with their family} Lives in: {Lives in:25570} Stairs: {opstairs:27293} Has following equipment at home: {Assistive devices:23999}  OCCUPATION: ***  PLOF: {PLOF:24004}  PATIENT GOALS: ***  NEXT MD VISIT: ***  OBJECTIVE:  Note: Objective measures were completed at Evaluation unless otherwise noted.  DIAGNOSTIC FINDINGS: ***  PATIENT SURVEYS:  {rehab surveys:24030}  COGNITION: Overall cognitive status: {cognition:24006}     SENSATION: {sensation:27233}  EDEMA:  {edema:24020}  MUSCLE LENGTH: Hamstrings: Right *** deg; Left *** deg Debby test: Right *** deg;  Left *** deg  POSTURE: {posture:25561}  PALPATION: ***  LOWER EXTREMITY ROM:  {AROM/PROM:27142} ROM Right eval Left eval  Hip flexion    Hip extension    Hip abduction    Hip adduction    Hip internal rotation    Hip external rotation    Knee flexion    Knee extension    Ankle dorsiflexion    Ankle plantarflexion    Ankle inversion    Ankle eversion     (Blank rows = not tested)  LOWER EXTREMITY MMT:  MMT Right eval Left eval  Hip flexion    Hip extension    Hip abduction    Hip adduction    Hip internal rotation    Hip external rotation    Knee flexion    Knee extension    Ankle dorsiflexion    Ankle plantarflexion    Ankle inversion    Ankle eversion     (Blank rows = not tested)  LOWER EXTREMITY SPECIAL TESTS:  {LEspecialtests:26242}  FUNCTIONAL TESTS:  {Functional tests:24029}  GAIT: Distance walked: *** Assistive device utilized: {Assistive devices:23999} Level of assistance: {Levels of assistance:24026} Comments: ***                                                                                                                                TREATMENT DATE: ***    PATIENT EDUCATION:  Education details: *** Person educated: {Person educated:25204} Education method: {Education Method:25205} Education comprehension: {Education Comprehension:25206}  HOME EXERCISE PROGRAM: ***  ASSESSMENT:  CLINICAL IMPRESSION: Patient is a *** y.o. *** who was seen today for physical therapy evaluation and treatment for ***.   OBJECTIVE IMPAIRMENTS: {opptimpairments:25111}.   ACTIVITY LIMITATIONS: {activitylimitations:27494}  PARTICIPATION LIMITATIONS: {participationrestrictions:25113}  PERSONAL FACTORS: {Personal factors:25162} are also affecting patient's functional outcome.   REHAB POTENTIAL: {rehabpotential:25112}  CLINICAL DECISION MAKING: {clinical decision making:25114}  EVALUATION COMPLEXITY: {Evaluation  complexity:25115}   GOALS: Goals reviewed with patient? {yes/no:20286}  SHORT TERM GOALS: Target date: *** *** Baseline: Goal status: INITIAL  2.  *** Baseline:  Goal status: INITIAL  3.  *** Baseline:  Goal status: INITIAL  4.  *** Baseline:  Goal status: INITIAL  5.  *** Baseline:  Goal status: INITIAL  6.  *** Baseline:  Goal status: INITIAL  LONG TERM GOALS: Target date: ***  *** Baseline:  Goal status: INITIAL  2.  *** Baseline:  Goal status: INITIAL  3.  *** Baseline:  Goal status: INITIAL  4.  *** Baseline:  Goal status: INITIAL  5.  *** Baseline:  Goal status: INITIAL  6.  *** Baseline:  Goal status: INITIAL   PLAN:  PT FREQUENCY: {rehab frequency:25116}  PT DURATION: {rehab duration:25117}  PLANNED INTERVENTIONS: {rehab planned interventions:25118::97110-Therapeutic exercises,97530- Therapeutic [phone]- Neuromuscular re-education,97535- Self Rjmz,02859- Manual therapy,Patient/Family education}  PLAN FOR NEXT SESSION: PIERRETTE Kristeen Sar, PT 05/25/2024, 2:16 PM  "

## 2024-05-26 ENCOUNTER — Ambulatory Visit: Attending: Physical Therapy | Admitting: Physical Therapy

## 2024-05-30 ENCOUNTER — Other Ambulatory Visit: Payer: Self-pay | Admitting: *Deleted

## 2024-05-30 DIAGNOSIS — K529 Noninfective gastroenteritis and colitis, unspecified: Secondary | ICD-10-CM

## 2024-06-01 ENCOUNTER — Ambulatory Visit: Payer: Self-pay | Admitting: Family Medicine

## 2024-06-02 ENCOUNTER — Ambulatory Visit: Payer: Self-pay | Admitting: Family Medicine

## 2024-06-02 LAB — PANCREATIC ELASTASE, FECAL: Pancreatic Elastase, Fecal: 143 ug Elast./g — ABNORMAL LOW

## 2024-06-03 LAB — C DIFFICILE, CYTOTOXIN B

## 2024-06-03 LAB — C DIFFICILE TOXINS A+B W/RFLX: C difficile Toxins A+B, EIA: NEGATIVE

## 2024-06-06 ENCOUNTER — Ambulatory Visit: Admitting: Cardiology

## 2024-06-10 ENCOUNTER — Ambulatory Visit: Admitting: Gastroenterology

## 2024-06-10 ENCOUNTER — Encounter: Payer: Self-pay | Admitting: Gastroenterology
